# Patient Record
Sex: Female | Born: 1950 | Race: White | Hispanic: No | Marital: Single | Smoking: Former smoker
Health system: Southern US, Community
[De-identification: ages and names within clinical notes are randomized; demographics above are authoritative.]

## PROBLEM LIST (undated history)

## (undated) DIAGNOSIS — A4902 Methicillin resistant Staphylococcus aureus infection, unspecified site: Secondary | ICD-10-CM

## (undated) DIAGNOSIS — Z22322 Carrier or suspected carrier of Methicillin resistant Staphylococcus aureus: Secondary | ICD-10-CM

## (undated) DIAGNOSIS — K219 Gastro-esophageal reflux disease without esophagitis: Secondary | ICD-10-CM

## (undated) DIAGNOSIS — M1990 Unspecified osteoarthritis, unspecified site: Secondary | ICD-10-CM

## (undated) DIAGNOSIS — C9310 Chronic myelomonocytic leukemia not having achieved remission: Secondary | ICD-10-CM

## (undated) DIAGNOSIS — R197 Diarrhea, unspecified: Secondary | ICD-10-CM

## (undated) DIAGNOSIS — R945 Abnormal results of liver function studies: Secondary | ICD-10-CM

## (undated) DIAGNOSIS — E538 Deficiency of other specified B group vitamins: Secondary | ICD-10-CM

## (undated) DIAGNOSIS — I4892 Unspecified atrial flutter: Secondary | ICD-10-CM

## (undated) DIAGNOSIS — Z9289 Personal history of other medical treatment: Secondary | ICD-10-CM

## (undated) DIAGNOSIS — I10 Essential (primary) hypertension: Secondary | ICD-10-CM

## (undated) DIAGNOSIS — J189 Pneumonia, unspecified organism: Secondary | ICD-10-CM

## (undated) DIAGNOSIS — R42 Dizziness and giddiness: Secondary | ICD-10-CM

## (undated) DIAGNOSIS — K559 Vascular disorder of intestine, unspecified: Secondary | ICD-10-CM

## (undated) DIAGNOSIS — R51 Headache: Secondary | ICD-10-CM

## (undated) DIAGNOSIS — E6609 Other obesity due to excess calories: Secondary | ICD-10-CM

## (undated) DIAGNOSIS — M13169 Monoarthritis, not elsewhere classified, unspecified knee: Secondary | ICD-10-CM

## (undated) DIAGNOSIS — D649 Anemia, unspecified: Secondary | ICD-10-CM

## (undated) DIAGNOSIS — R7989 Other specified abnormal findings of blood chemistry: Secondary | ICD-10-CM

## (undated) DIAGNOSIS — J449 Chronic obstructive pulmonary disease, unspecified: Secondary | ICD-10-CM

## (undated) HISTORY — DX: Deficiency of other specified B group vitamins: E53.8

## (undated) HISTORY — DX: Other specified abnormal findings of blood chemistry: R79.89

## (undated) HISTORY — PX: SMALL INTESTINE SURGERY: SHX150

## (undated) HISTORY — DX: Abnormal results of liver function studies: R94.5

## (undated) HISTORY — DX: Vascular disorder of intestine, unspecified: K55.9

## (undated) HISTORY — DX: Methicillin resistant Staphylococcus aureus infection, unspecified site: A49.02

## (undated) HISTORY — DX: Gastro-esophageal reflux disease without esophagitis: K21.9

## (undated) HISTORY — PX: OTHER SURGICAL HISTORY: SHX169

## (undated) HISTORY — DX: Essential (primary) hypertension: I10

## (undated) HISTORY — DX: Diarrhea, unspecified: R19.7

## (undated) HISTORY — DX: Chronic myelomonocytic leukemia not having achieved remission: C93.10

## (undated) HISTORY — DX: Anemia, unspecified: D64.9

## (undated) HISTORY — DX: Unspecified osteoarthritis, unspecified site: M19.90

## (undated) HISTORY — DX: Chronic obstructive pulmonary disease, unspecified: J44.9

## (undated) HISTORY — DX: Other obesity due to excess calories: E66.09

## (undated) HISTORY — PX: KNEE SURGERY: SHX244

## (undated) HISTORY — DX: Monoarthritis, not elsewhere classified, unspecified knee: M13.169

---
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
## 1986-04-18 HISTORY — PX: ABDOMINAL HYSTERECTOMY: SHX81

## 1998-07-29 ENCOUNTER — Other Ambulatory Visit: Admission: RE | Admit: 1998-07-29 | Discharge: 1998-07-29 | Payer: Self-pay | Admitting: Obstetrics and Gynecology

## 1999-10-17 ENCOUNTER — Encounter: Payer: Self-pay | Admitting: Neurological Surgery

## 1999-10-17 ENCOUNTER — Ambulatory Visit (HOSPITAL_COMMUNITY): Admission: RE | Admit: 1999-10-17 | Discharge: 1999-10-17 | Payer: Self-pay | Admitting: Neurological Surgery

## 1999-11-02 ENCOUNTER — Other Ambulatory Visit: Admission: RE | Admit: 1999-11-02 | Discharge: 1999-11-02 | Payer: Self-pay | Admitting: Obstetrics and Gynecology

## 1999-11-11 ENCOUNTER — Encounter: Payer: Self-pay | Admitting: Obstetrics and Gynecology

## 1999-11-11 ENCOUNTER — Ambulatory Visit (HOSPITAL_COMMUNITY): Admission: RE | Admit: 1999-11-11 | Discharge: 1999-11-11 | Payer: Self-pay | Admitting: Obstetrics and Gynecology

## 1999-11-18 ENCOUNTER — Encounter: Admission: RE | Admit: 1999-11-18 | Discharge: 1999-11-18 | Payer: Self-pay | Admitting: Neurosurgery

## 1999-11-18 ENCOUNTER — Encounter: Payer: Self-pay | Admitting: Neurological Surgery

## 2001-01-16 ENCOUNTER — Other Ambulatory Visit: Admission: RE | Admit: 2001-01-16 | Discharge: 2001-01-16 | Payer: Self-pay | Admitting: Obstetrics and Gynecology

## 2005-03-04 ENCOUNTER — Encounter: Admission: RE | Admit: 2005-03-04 | Discharge: 2005-03-04 | Payer: Self-pay | Admitting: Obstetrics and Gynecology

## 2005-03-21 ENCOUNTER — Encounter: Admission: RE | Admit: 2005-03-21 | Discharge: 2005-03-21 | Payer: Self-pay | Admitting: Obstetrics and Gynecology

## 2006-05-16 ENCOUNTER — Encounter: Admission: RE | Admit: 2006-05-16 | Discharge: 2006-05-16 | Payer: Self-pay | Admitting: Geriatric Medicine

## 2006-09-29 ENCOUNTER — Inpatient Hospital Stay (HOSPITAL_COMMUNITY): Admission: AD | Admit: 2006-09-29 | Discharge: 2006-10-01 | Payer: Self-pay | Admitting: Orthopaedic Surgery

## 2006-10-02 ENCOUNTER — Ambulatory Visit: Payer: Self-pay | Admitting: Infectious Diseases

## 2007-07-11 ENCOUNTER — Encounter: Admission: RE | Admit: 2007-07-11 | Discharge: 2007-07-11 | Payer: Self-pay | Admitting: Cardiology

## 2007-07-19 ENCOUNTER — Observation Stay (HOSPITAL_COMMUNITY): Admission: AD | Admit: 2007-07-19 | Discharge: 2007-07-20 | Payer: Self-pay | Admitting: Gastroenterology

## 2007-07-20 ENCOUNTER — Encounter (INDEPENDENT_AMBULATORY_CARE_PROVIDER_SITE_OTHER): Payer: Self-pay | Admitting: Gastroenterology

## 2008-06-23 ENCOUNTER — Encounter: Admission: RE | Admit: 2008-06-23 | Discharge: 2008-06-23 | Payer: Self-pay | Admitting: Cardiology

## 2010-05-09 ENCOUNTER — Encounter: Payer: Self-pay | Admitting: Cardiology

## 2010-05-09 ENCOUNTER — Encounter: Payer: Self-pay | Admitting: Gastroenterology

## 2010-06-03 ENCOUNTER — Ambulatory Visit (INDEPENDENT_AMBULATORY_CARE_PROVIDER_SITE_OTHER): Payer: BLUE CROSS/BLUE SHIELD | Admitting: Cardiology

## 2010-06-03 DIAGNOSIS — Z79899 Other long term (current) drug therapy: Secondary | ICD-10-CM

## 2010-06-03 DIAGNOSIS — I119 Hypertensive heart disease without heart failure: Secondary | ICD-10-CM

## 2010-06-03 DIAGNOSIS — M1990 Unspecified osteoarthritis, unspecified site: Secondary | ICD-10-CM

## 2010-08-10 ENCOUNTER — Other Ambulatory Visit: Payer: Self-pay | Admitting: *Deleted

## 2010-08-10 DIAGNOSIS — I119 Hypertensive heart disease without heart failure: Secondary | ICD-10-CM

## 2010-08-10 MED ORDER — HYDROCHLOROTHIAZIDE 25 MG PO TABS: ORAL_TABLET | ORAL | Status: DC

## 2010-08-10 NOTE — Telephone Encounter (Signed)
Refilled meds per fax request.  

## 2010-08-31 NOTE — H&P (Signed)
NAMESHERAE, SANTINO NO.:  [id number]   MEDICAL RECORD NO.:  [id number]          PATIENT TYPE:  INP   LOCATION:  6742                         FACILITY:  [facility]   PHYSICIAN:  Petra Kuba, M.D.    DATE OF BIRTH:  [birth year]   DATE OF ADMISSION:  07/19/2007  DATE OF DISCHARGE:                              HISTORY & PHYSICAL   ADMITTING DIAGNOSES:  1. Severe nausea.  2. Abnormal CT.  3. Elevated liver function tests.   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  She  describes feeling ill for the past month and feeling particularly  nauseated over the last week.  She thought the nausea might be coming  from either recent antibiotics or iron that she has started taking for  her anemia.  She denies any hematochezia, hematemesis or melenic stools.  She tells me that she has been constipated lately and that this is  normal for her.  She denies any abdominal pain.  Her appetite has  decreased.   Unfortunately, she has MRSA in her knee last summer and developed sepsis  from it.  She got over that after a course of IV vancomycin.  This past  October, an area of MRSA reoccurred on her lower right leg; she is being  treated for that with Septra, which was just stopped this past Monday  due to her severe nausea.   She was found to be anemic in Dr. Yevonne Pax office with a hemoglobin  of 9.8.  She had elevated transaminases, alkaline phosphatase was 504,  AST was 49, ALT was 57.  She has CT of her abdomen which showed somewhat  prominent porta hepatis, but a normal liver.  The patient reports she  has been vaccinated for hepatitis B.   MEDICATIONS:  Include Lotrel, hydrochlorothiazide, Clarinex, Celebrex,  Estrace, Wellbutrin and Prilosec.   ALLERGIES:  She has allergies to PENICILLIN, TETANUS and CODEINE.   PAST SURGICAL HISTORY:  1. Ventral hernia repair.  2. Two  laparotomies for bowel obstructions.  3. Surgery to her knee, requiring frequent drainage arthroscopically      due to the MRSA that she had last summer.  4. Appendectomy.  5. Hysterectomy.  6. Tubal ligation.  7. Tonsillectomy.   PAST MEDICAL HISTORY:  She has a history of:  1. Depression.  2. Acid reflux.  3. Migraines.  4. Hypertension.   FAMILY HISTORY:  Negative for colon cancer and colon polyps; it is also  negative for liver disease.   SOCIAL HISTORY:  She is positive for tobacco; she smokes 4-5 cigarettes  per day and has for 20 years.  She drinks rare alcohol.   PHYSICAL EXAMINATION:  GENERAL: She is alert and oriented, in no  apparent distress.  HEART:  Regular rate and rhythm.  LUNGS:  Demonstrate expiratory wheeze bilaterally.  ABDOMEN:  Soft, nontender, nondistended with good bowel sounds.  SKIN:  She has no jaundice, no icterus.  RECTAL:  Deferred, but she was found to  be guaiac-negative in the [facility] office by Dr. Randa Evens on March 31.   LABORATORY AND ACCESSORY CLINICAL DATA:  Today's labs show a white count  4.1, hemoglobin 10.9, hematocrit 31.3, MCV value of 87.8, platelets  281,000.  Sodium 123, potassium 4, chloride 90, bicarb 24, glucose 114,  BUN 17, creatinine 1.10.  Her bilirubin is found to be 0.5, alkaline  phosphatase 466, AST 44, ALT 48, albumin 3.7.   ASSESSMENT:  Dr. Vida Rigger has seen and examined the patient, collected  a history and reviewed her chart.  His impression is that she is:  1. Hyponatremic and likely dehydrated.  2. Has severe nausea.  3. Has an abnormality in her porta hepatis on CT scan and has abnormal      LFTs.  We will review CT with the radiologist, plan for an upper      endoscopy in the morning and give her a bolus of normal saline this      evening and continue with intravenous hydration, proceed with      checking her fractionated alkaline phosphatase as well as AMA and      ANA.  The patient has been briefed on  endoscopy; she understands      the procedures as well as the risks of perforation, sedation and      bleeding and she agrees to proceed.      Stephani Police, PA    ______________________________  Petra Kuba, M.D.    MLY/MEDQ  D:  07/19/2007  T:  07/20/2007  Job:  [id number]   cc:   Petra Kuba, M.D.

## 2010-08-31 NOTE — Op Note (Signed)
NAMERIKKI, TROSPER NO.:  [id number]   MEDICAL RECORD NO.:  [id number]          PATIENT TYPE:  INP   LOCATION:  6742                         FACILITY:  [facility]   PHYSICIAN:  Petra Kuba, M.D.    DATE OF BIRTH:  [birth year]   DATE OF PROCEDURE:  07/20/2007  DATE OF DISCHARGE:                               OPERATIVE REPORT   PROCEDURE:  Esophagogastroduodenoscopy with biopsy.   INDICATIONS:  Significant nausea, abnormal CAT scan with questions about  perihilar lymph node and an abnormal esophagus.  Anemia.   CONSENT:  Consent was signed after risks, benefits and options  thoroughly discussed last night prior to sedation.   MEDICINES USED:  Fentanyl 75 mcg, Versed 8 mg.   DESCRIPTION OF PROCEDURE:  The video endoscope was inserted by direct  vision.  Quick look at the vocal cords was normal.  The esophagus was  normal.  There were no signs of esophagitis.  She did have a small  hiatal hernia.  Scope passed into the stomach, advanced through a normal  antrum, normal pylorus, into a normal duodenal bulb, around the C-loop;  I believe we saw the ampulla, which was normal, and into the second and  possibly third part of the duodenum.  The duodenum appeared normal.  A  few scattered biopsies were obtained to rule out malabsorption.  The  scope was slowly withdrawn back to the bulb, which was normal.  The  scope was withdrawn back to the stomach and retroflexed.  The angularis,  cardia, lesser and greater curve were all normal on retroflexed  visualization, except for the hiatal hernia being confirmed in the  cardia.  Straight visualization in the stomach did not reveal any  additional findings.  Air was suctioned.  The scope was slowly withdrawn  again.  A good look at the esophagus was normal.  Scope was removed.  The patient tolerated the procedure well.  There was no obvious  immediate complication.   ENDOSCOPIC DIAGNOSES:  1. Small hiatal hernia.  2.  Otherwise normal EGD to the second or third part of the duodenum,      status post duodenal biopsies.  I believe we saw the ampulla, which      appeared normal.   PLAN:  Await pathology.  If okay after lunch, consider outpatient workup  with probable EUS and biopsy of any of the nodes are seen and probable  colonoscopy next, due to her iron deficiency.  Follow liver tests.  Await AMA and other hepatitis studies.  Recheck sodium next week.           ______________________________  Petra Kuba, M.D.    MEM/MEDQ  D:  07/20/2007  T:  07/20/2007  Job:  [id number]   cc:   Fayrene Fearing L. Malon Kindle., M.D.  Cassell Clement, M.D.

## 2010-08-31 NOTE — Consult Note (Signed)
[name], [name] NO.:  [id number]   MEDICAL RECORD NO.:  [id number]          PATIENT TYPE:  INP   LOCATION:  6742                         FACILITY:  [facility]   PHYSICIAN:  Cassell Clement, M.D. DATE OF BIRTH:  21-Jul-1950   DATE OF CONSULTATION:  DATE OF DISCHARGE:                                 CONSULTATION   CHIEF COMPLAINT:  Weakness and hyponatremia.   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 Carman Ching' service because of possible  dehydration and hyponatremia.  She has been followed in our office for  many years for essential hypertension.  She has been on long-term Lotrel  5/40 once a day and hydrochlorothiazide 25 mg once a day for difficult-  to-control hypertension.  Other medications chronically have included  Clarinex, Estrace, Wellbutrin SR 300, Celebrex 200 mg daily and calcium  with vitamin D.  The patient was seen in our office on June 20, 2007,  for a routine 9-month office visit and at that time the patient  complained of some vertigo and dizziness and we obtained routine labs  including a CBC and a CMET.  At that time she was found to have a sodium  of 136 and alkaline phosphatase of 504.  Her hemoglobin was low at 9.8  although her cells appeared to be normocytic/normochromic.  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  She did not give  any history of hematochezia or melena or hematemesis and she is post  hysterectomy and has not had any vaginal blood loss.  Subsequently, she  developed further weakness and nausea and called our office and we made  arrangements for her to have an outpatient CT scan of her abdomen and  pelvis at [facility].  This was done on March 25 and showed no  acute abdominal process, showed constipation and showed mild upper  abdominal adenopathy.  It also  appeared show some fluid level in the  distal esophagus.  The patient's family called back earlier this week  stating that the patient was worse and we advised her to see her  gastroenterologist, Dr. Randa Evens, who saw her earlier this week.  Blood  work drawn on July 18, 2007, by Dr. Randa Evens showed a sodium of 136,  hemoglobin of 10.2, and today on admission sodium is down to 123.   The patient is allergic to PENICILLIN, TETANUS and CODEINE.  She also  does not tolerate APRESOLINE, MINIPRESS and DOXYCYCLINE.  The  doxycycline caused dizziness.   PAST MEDICAL HISTORY:  Positive for depression, history of acid reflux.  Of note is the fact that the patient had an injury suffered in the  [place] and subsequent surgery and has had a MRSA infection of her skin  on the right lower leg.   SOCIAL HISTORY:  Reveals that she is divorced.  She has two children.  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  She smokes  4-5  cigarettes a day.  She does not drink much alcohol.   FAMILY HISTORY:  Positive for hypertension.   PHYSICAL EXAM:  On the night of admission her blood pressure is 130/85  in the right arm sitting, pulse is 80 and regular, respirations are  normal.  HEAD AND NECK EXAM:  Revealed no scleral icterus.  The jugular venous  pressure is normal.  Carotids are normal.  CHEST:  Clear.  HEART:  Reveals no murmur, gallop, rub or click.  ABDOMEN:  Soft and nontender.  Liver and spleen are not palpable.  There  is no palpable mass or organomegaly.  Abdomen is soft.  EXTREMITIES:  Show no phlebitis.  She does have a bandage on the right leg wound.   IMPRESSION:  1. Hyponatremia, uncertain etiology.  Most likely secondary to long      term ACE inhibitor use and hydrochlorothiazide use but we want to      rule out other causes such as serum inappropriate antidiuretic      hormone secretion.  2. History of essential hypertension previously difficult to control      but now  normotensive off of medications.  3. Probable dehydration.  4. Tobacco abuse.   DISPOSITION:  As already ordered we will give IV saline tonight and  observe response.  Will check urine sodium and potassium and osmolality,  as well as a serum osmolality.  Will follow with you.   Thank you for notifying me of her admission.           ______________________________  Cassell Clement, M.D.     TB/MEDQ  D:  07/19/2007  T:  07/20/2007  Job:  [id number]   cc:   Fayrene Fearing L. Malon Kindle., M.D.

## 2010-08-31 NOTE — Op Note (Signed)
NAME:  [name], HALBERT NO.:  [id number]   MEDICAL RECORD NO.:  [id number]          PATIENT TYPE:  AMB   LOCATION:  SDS                          FACILITY:  [facility]   PHYSICIAN:  Claude Manges. Whitfield, M.D.DATE OF BIRTH:  [birth year]   DATE OF PROCEDURE:  09/29/2006  DATE OF DISCHARGE:                               OPERATIVE REPORT   PREOPERATIVE DIAGNOSIS:  Prepatellar abscess, right knee.   POSTOPERATIVE DIAGNOSIS:  Prepatellar abscess, right knee.   PROCEDURE:  Irrigation and debridement prepatellar abscess, right knee.   SURGEON:  Claude Manges. Cleophas Dunker, M.D.   ASSISTANT:  Richardean Canal, P.A.-C.   ANESTHESIA:  General.   COMPLICATIONS:  None.   HISTORY:  60 year old female who is approximately five weeks status post  injury to her right knee where she sustained a direct blow landing on an  object while visiting in the [place]. She sustained a large transverse  laceration of the suprapatellar pouch of the right knee that was  repaired by a physician in the [place].  She returned to the [place] and began to develop some breakdown of the wound.  She was seen  initially in my office earlier in the week with evidence of probable  stitch abscess.  She was placed on Keflex.  She has been on various  antibiotics over the last several weeks.  She was seen today in the  office with a large fluid wave in the prepatellar area.  This was  aspirated with abundant purulence.  Gram stain was negative for  organisms but positive for white blood cells. Culture is pending. She is  now to have irrigation and debridement the abscess.   DESCRIPTION OF PROCEDURE:  With the patient comfortable on the operating  table under general orotracheal anesthesia, the right lower extremity  was placed in a thigh tourniquet.  The leg was then prepped with  Betadine scrub and Betadine solution from the tourniquet to the ankle.  Sterile draping was performed. With the extremity still  elevated, it was  Esmarch exsanguinated and the proximal tourniquet at 350 mmHg.   The prior laceration was transverse, probably 6 or more inches in  length, and located across the distal thigh proximal to the patella.  There were several areas that were open and there were actually several  areas of tiny puncture areas of purulence. I initially made about a 1  inch longitudinal incision in the lateral parapatellar area where there  was the most fluctuance and upon entering the prepatellar pouch, there  was abundant purulence.  By finger palpation, extended from the tibial  tubercle to the medial prepatellar region to about an inch proximal to  the laceration. By finger palpation, I was able to break up any fibrous  bands and thus, decompress any areas that were sequestering purulence.  I copiously irrigated the wound with several 1000 mL of saline solution.   Because of the small areas of purulence within the most lateral  incision, I opened it up about an inch and removed several clear skin  stitches that might  very well have been Monocryl. These areas were  debrided and then I simply closed them all with interrupted 3-0 nylon.  I also closed the 1 inch incision longitudinally with the same material  prior to which I inserted a Jackson-Pratt tube that was externalized  through a separate stab wound inferiorly and medially. Xeroform gauze  was placed over all the wounds followed by a sterile bulky dressing and  Ace bandage. The tourniquet was deflated prior to which I thought that  all the skin had good capillary refill.   PLAN:  We will admit Ms. Coad for several days, get an infectious  disease consult, will start IV vancomycin as she is allergic to  penicillin, and await culture report.      Claude Manges. Cleophas Dunker, M.D.  Electronically Signed     PWW/MEDQ  D:  09/29/2006  T:  09/30/2006  Job:  [id number]

## 2010-12-06 ENCOUNTER — Encounter: Payer: Self-pay | Admitting: Cardiology

## 2010-12-15 ENCOUNTER — Other Ambulatory Visit (INDEPENDENT_AMBULATORY_CARE_PROVIDER_SITE_OTHER): Payer: BLUE CROSS/BLUE SHIELD | Admitting: *Deleted

## 2010-12-15 ENCOUNTER — Other Ambulatory Visit: Payer: Self-pay | Admitting: Cardiology

## 2010-12-15 ENCOUNTER — Ambulatory Visit (INDEPENDENT_AMBULATORY_CARE_PROVIDER_SITE_OTHER): Payer: BLUE CROSS/BLUE SHIELD | Admitting: Cardiology

## 2010-12-15 VITALS — BP 160/90 | HR 88

## 2010-12-15 DIAGNOSIS — J45909 Unspecified asthma, uncomplicated: Secondary | ICD-10-CM

## 2010-12-15 DIAGNOSIS — D649 Anemia, unspecified: Secondary | ICD-10-CM

## 2010-12-15 DIAGNOSIS — I119 Hypertensive heart disease without heart failure: Secondary | ICD-10-CM | POA: Insufficient documentation

## 2010-12-15 DIAGNOSIS — E7800 Pure hypercholesterolemia, unspecified: Secondary | ICD-10-CM

## 2010-12-15 DIAGNOSIS — R748 Abnormal levels of other serum enzymes: Secondary | ICD-10-CM

## 2010-12-15 DIAGNOSIS — I10 Essential (primary) hypertension: Secondary | ICD-10-CM

## 2010-12-15 HISTORY — DX: Anemia, unspecified: D64.9

## 2010-12-15 LAB — HEPATIC FUNCTION PANEL
AST: 36 U/L (ref 0–37)
Albumin: 4.3 g/dL (ref 3.5–5.2)
Alkaline Phosphatase: 180 U/L — ABNORMAL HIGH (ref 39–117)
Bilirubin, Direct: 0.2 mg/dL (ref 0.0–0.3)
Total Protein: 7 g/dL (ref 6.0–8.3)

## 2010-12-15 LAB — CBC WITH DIFFERENTIAL/PLATELET
HCT: 31.6 % — ABNORMAL LOW (ref 36.0–46.0)
MCHC: 32.7 g/dL (ref 30.0–36.0)
MCV: 100.8 fl — ABNORMAL HIGH (ref 78.0–100.0)
Platelets: 399 10*3/uL (ref 150.0–400.0)

## 2010-12-15 LAB — LIPID PANEL
Cholesterol: 216 mg/dL — ABNORMAL HIGH (ref 0–200)
Total CHOL/HDL Ratio: 2

## 2010-12-15 LAB — BASIC METABOLIC PANEL
BUN: 19 mg/dL (ref 6–23)
CO2: 24 mEq/L (ref 19–32)
Calcium: 9.1 mg/dL (ref 8.4–10.5)
Creatinine, Ser: 0.9 mg/dL (ref 0.4–1.2)

## 2010-12-15 NOTE — Assessment & Plan Note (Signed)
The patient has not been experiencing any increased shortness of breath.  She denies any chest pain.  She did take a Claritin-D this morning which may explain the fact that her blood pressure is higher than usual today

## 2010-12-15 NOTE — Assessment & Plan Note (Signed)
Patient has a history of hypercholesterolemia.  She is trying to work harder to bring her weight down.  She is attempting to stay on a low-cholesterol diet.  Blood work is pending.

## 2010-12-15 NOTE — Progress Notes (Signed)
[name] Date of Birth:  Jan 17, 1951 [facility] [address]   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  She has a past history of essential hypertension she also has a history of chronically elevated liver function studies she's had a past history of anemia and a past history of osteoarthritis.  When we last saw her in February 2012 she had a recent MRSA infection of the right knee.  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  Since last visit she's had no new cardiac symptoms.  Current Outpatient Prescriptions  Medication Sig Dispense Refill  . benazepril (LOTENSIN) 20 MG tablet Take 20 mg by mouth daily.        Marland Kitchen buPROPion (WELLBUTRIN XL) 300 MG 24 hr tablet Take 300 mg by mouth daily.        . Calcium Carbonate-Vitamin D (CALCIUM + D PO) Take 500 mg by mouth daily.        . celecoxib (CELEBREX) 200 MG capsule Take 200 mg by mouth daily.        . cyclobenzaprine (FLEXERIL) 10 MG tablet Take 10 mg by mouth as needed.        . Desloratadine (CLARINEX PO) Take by mouth as needed.        Marland Kitchen estradiol (ESTRACE) 2 MG tablet Take 2 mg by mouth daily.        . hydrochlorothiazide 25 MG tablet Daily or as directed  30 tablet  11  . montelukast (SINGULAIR) 10 MG tablet Take 10 mg by mouth as needed.        . Multiple Vitamin (MULTIVITAMIN PO) Take by mouth.          Allergies  Allergen Reactions  . Doxycycline   . Minitabs (Aspirin)   . Penicillins     There is no problem list on file for this patient.   History  Smoking status  . Current Some Day Smoker  Smokeless tobacco  . Not on file    History  Alcohol Use     No family history on file.  Review of Systems: The patient denies any heat or cold intolerance.  No weight gain or weight loss.  The patient denies headaches or  blurry vision.  There is no cough or sputum production.  The patient denies dizziness.  There is no hematuria or hematochezia.  The patient denies any muscle aches or arthritis.  The patient denies any rash.  The patient denies frequent falling or instability.  There is no history of depression or anxiety.  All other systems were reviewed and are negative.   Physical Exam: Filed Vitals:   12/15/10 1048  BP: 160/90  Pulse: 88  the general appearance reveals a well-developed well-nourished woman in no distress.The head and neck exam reveals pupils equal and reactive.  Extraocular movements are full.  There is no scleral icterus.  The mouth and pharynx are normal.  The neck is supple.  The carotids reveal no bruits.  The jugular venous pressure is normal.  The  thyroid is not enlarged.  There is no lymphadenopathy.  The chest is clear to percussion and auscultation.  There are no rales or rhonchi.  Expansion of the chest is symmetrical.  The precordium is quiet.  The first  heart sound is normal.  The second heart sound is physiologically split.  There is no murmur gallop rub or click.  There is no abnormal lift or heave.  The abdomen is soft and nontender.  The bowel sounds are normal.  The liver and spleen are not enlarged.  There are no abdominal masses.  There are no abdominal bruits.  Extremities reveal good pedal pulses.  There is no phlebitis or edema.  There is no cyanosis or clubbing.  Strength is normal and symmetrical in all extremities.  There is no lateralizing weakness.  There are no sensory deficits.  The skin is warm and dry.  There is no rash.      Assessment / Plan:  Continue same medication.  Recheck in 6 monthsfor office visit and fasting lab work and CBC to followup on her anemia

## 2010-12-15 NOTE — Assessment & Plan Note (Signed)
The patient has not had any evidence of hematochezia or melena

## 2010-12-16 ENCOUNTER — Telehealth: Payer: Self-pay | Admitting: *Deleted

## 2010-12-16 NOTE — Telephone Encounter (Signed)
Message copied by Eugenia Pancoast on Thu Dec 16, 2010 12:24 PM ------      Message from: Cassell Clement      Created: Wed Dec 15, 2010  9:30 PM       Please report.  Her white count is normal at 5400.The hemoglobin is stable at 10.3.The cholesterol is 216 and HDL is very good at 92The liver function studies are normal except that the alkaline phosphatase remains high at 180 but this could also be from her previous problem with her bones.Her renal function is normal with a BUN of 19 and a creatinine of 0.9 her sodium is 134.  The blood sugar is normal at 85.            Continue same medication

## 2010-12-16 NOTE — Telephone Encounter (Signed)
Mailed copy of labs 

## 2011-01-11 LAB — URINALYSIS, ROUTINE W REFLEX MICROSCOPIC
Glucose, UA: NEGATIVE
Nitrite: NEGATIVE
Specific Gravity, Urine: 1.007
pH: 6.5

## 2011-01-11 LAB — COMPREHENSIVE METABOLIC PANEL
ALT: 48 — ABNORMAL HIGH
AST: 38 — ABNORMAL HIGH
Albumin: 3.7
Alkaline Phosphatase: 466 — ABNORMAL HIGH
BUN: 17
Calcium: 8.2 — ABNORMAL LOW
Chloride: 90 — ABNORMAL LOW
GFR calc non Af Amer: 56 — ABNORMAL LOW
Glucose, Bld: 114 — ABNORMAL HIGH
Potassium: 3.6
Potassium: 4
Sodium: 128 — ABNORMAL LOW
Total Bilirubin: 0.4
Total Bilirubin: 0.5
Total Protein: 5.6 — ABNORMAL LOW

## 2011-01-11 LAB — CULTURE, BLOOD (ROUTINE X 2): Culture: NO GROWTH

## 2011-01-11 LAB — ALKALINE PHOSPHATASE, ISOENZYMES
ALP, Heat Stable (Liver): 351
Alk Phos: 490 — ABNORMAL HIGH (ref 39–117)

## 2011-01-11 LAB — CBC
HCT: 31.3 — ABNORMAL LOW
MCHC: 34.5
MCHC: 34.9
MCV: 87.8
Platelets: 281
Platelets: 288
RDW: 14.9
RDW: 14.9
WBC: 4.1

## 2011-01-11 LAB — LIPID PANEL
Cholesterol: 195
Total CHOL/HDL Ratio: 3.1

## 2011-01-11 LAB — IRON AND TIBC
Iron: 39 — ABNORMAL LOW
TIBC: 447

## 2011-01-11 LAB — FERRITIN: Ferritin: 34 (ref 10–291)

## 2011-01-11 LAB — ANTI-NUCLEAR AB-TITER (ANA TITER): ANA Titer 1: 1:40 {titer} — ABNORMAL HIGH

## 2011-01-11 LAB — RETICULOCYTES: Retic Count, Absolute: 29.9

## 2011-01-11 LAB — B-NATRIURETIC PEPTIDE (CONVERTED LAB): Pro B Natriuretic peptide (BNP): <30

## 2011-01-11 LAB — T4: T4, Total: 9.9

## 2011-01-13 ENCOUNTER — Other Ambulatory Visit: Payer: Self-pay | Admitting: Geriatric Medicine

## 2011-01-13 DIAGNOSIS — M25552 Pain in left hip: Secondary | ICD-10-CM

## 2011-01-28 ENCOUNTER — Other Ambulatory Visit: Payer: Self-pay | Admitting: Geriatric Medicine

## 2011-01-28 ENCOUNTER — Ambulatory Visit
Admission: RE | Admit: 2011-01-28 | Discharge: 2011-01-28 | Disposition: A | Discharge status: Home / Self Care | Payer: BLUE CROSS/BLUE SHIELD | Source: Ambulatory Visit | Attending: Geriatric Medicine | Admitting: Geriatric Medicine

## 2011-01-28 DIAGNOSIS — M25552 Pain in left hip: Secondary | ICD-10-CM

## 2011-02-03 LAB — CBC
HCT: 28.6 — ABNORMAL LOW
HCT: 31.1 — ABNORMAL LOW
Hemoglobin: 10.5 — ABNORMAL LOW
Hemoglobin: 9.7 — ABNORMAL LOW
MCHC: 33.7
MCHC: 33.8
MCV: 86.7
MCV: 87.3
Platelets: 424 — ABNORMAL HIGH
Platelets: 443 — ABNORMAL HIGH
RBC: 3.3 — ABNORMAL LOW
RBC: 3.56 — ABNORMAL LOW
RDW: 15.1 — ABNORMAL HIGH
RDW: 15.3 — ABNORMAL HIGH
WBC: 6.4
WBC: 7.8

## 2011-02-03 LAB — BASIC METABOLIC PANEL
BUN: 9
CO2: 24
Calcium: 8.6
Calcium: 8.8
Creatinine, Ser: 0.64
Creatinine, Ser: 0.64
GFR calc Af Amer: >60
GFR calc non Af Amer: >60
Glucose, Bld: 103 — ABNORMAL HIGH
Sodium: 132 — ABNORMAL LOW

## 2011-02-03 LAB — APTT: aPTT: 31

## 2011-02-03 LAB — PROTIME-INR
INR: 1
Prothrombin Time: 13.6

## 2011-03-04 ENCOUNTER — Ambulatory Visit
Admission: RE | Admit: 2011-03-04 | Discharge: 2011-03-04 | Disposition: A | Discharge status: Home / Self Care | Payer: BC Managed Care – PPO | Source: Ambulatory Visit | Attending: Geriatric Medicine | Admitting: Geriatric Medicine

## 2011-03-04 ENCOUNTER — Other Ambulatory Visit: Payer: Self-pay | Admitting: Geriatric Medicine

## 2011-03-04 DIAGNOSIS — R531 Weakness: Secondary | ICD-10-CM

## 2011-03-04 DIAGNOSIS — R52 Pain, unspecified: Secondary | ICD-10-CM

## 2011-05-07 ENCOUNTER — Other Ambulatory Visit: Payer: Self-pay

## 2011-05-07 ENCOUNTER — Inpatient Hospital Stay (HOSPITAL_COMMUNITY)
Admission: EM | Admit: 2011-05-07 | Discharge: 2011-05-20 | DRG: 188 | Disposition: A | Discharge status: Home / Self Care | Payer: BC Managed Care – PPO | Attending: Internal Medicine | Admitting: Internal Medicine

## 2011-05-07 DIAGNOSIS — I119 Hypertensive heart disease without heart failure: Secondary | ICD-10-CM | POA: Diagnosis present

## 2011-05-07 DIAGNOSIS — Z8614 Personal history of Methicillin resistant Staphylococcus aureus infection: Secondary | ICD-10-CM

## 2011-05-07 DIAGNOSIS — Z8701 Personal history of pneumonia (recurrent): Secondary | ICD-10-CM

## 2011-05-07 DIAGNOSIS — E860 Dehydration: Secondary | ICD-10-CM | POA: Diagnosis present

## 2011-05-07 DIAGNOSIS — Z79890 Hormone replacement therapy (postmenopausal): Secondary | ICD-10-CM

## 2011-05-07 DIAGNOSIS — N289 Disorder of kidney and ureter, unspecified: Secondary | ICD-10-CM | POA: Diagnosis not present

## 2011-05-07 DIAGNOSIS — G8929 Other chronic pain: Secondary | ICD-10-CM | POA: Diagnosis present

## 2011-05-07 DIAGNOSIS — E669 Obesity, unspecified: Secondary | ICD-10-CM | POA: Diagnosis present

## 2011-05-07 DIAGNOSIS — M549 Dorsalgia, unspecified: Secondary | ICD-10-CM | POA: Diagnosis present

## 2011-05-07 DIAGNOSIS — E876 Hypokalemia: Secondary | ICD-10-CM | POA: Diagnosis not present

## 2011-05-07 DIAGNOSIS — K559 Vascular disorder of intestine, unspecified: Principal | ICD-10-CM | POA: Diagnosis present

## 2011-05-07 DIAGNOSIS — I4891 Unspecified atrial fibrillation: Secondary | ICD-10-CM | POA: Diagnosis not present

## 2011-05-07 DIAGNOSIS — E871 Hypo-osmolality and hyponatremia: Secondary | ICD-10-CM | POA: Diagnosis present

## 2011-05-07 DIAGNOSIS — R7402 Elevation of levels of lactic acid dehydrogenase (LDH): Secondary | ICD-10-CM | POA: Diagnosis present

## 2011-05-07 DIAGNOSIS — E7800 Pure hypercholesterolemia, unspecified: Secondary | ICD-10-CM | POA: Diagnosis present

## 2011-05-07 DIAGNOSIS — D72829 Elevated white blood cell count, unspecified: Secondary | ICD-10-CM | POA: Diagnosis present

## 2011-05-07 DIAGNOSIS — Z79899 Other long term (current) drug therapy: Secondary | ICD-10-CM

## 2011-05-07 DIAGNOSIS — R7401 Elevation of levels of liver transaminase levels: Secondary | ICD-10-CM | POA: Diagnosis present

## 2011-05-07 DIAGNOSIS — Z880 Allergy status to penicillin: Secondary | ICD-10-CM

## 2011-05-07 DIAGNOSIS — I491 Atrial premature depolarization: Secondary | ICD-10-CM | POA: Diagnosis not present

## 2011-05-07 DIAGNOSIS — D649 Anemia, unspecified: Secondary | ICD-10-CM | POA: Diagnosis present

## 2011-05-07 DIAGNOSIS — I959 Hypotension, unspecified: Secondary | ICD-10-CM | POA: Diagnosis present

## 2011-05-07 DIAGNOSIS — R197 Diarrhea, unspecified: Secondary | ICD-10-CM | POA: Diagnosis present

## 2011-05-07 DIAGNOSIS — J309 Allergic rhinitis, unspecified: Secondary | ICD-10-CM | POA: Diagnosis present

## 2011-05-07 DIAGNOSIS — E861 Hypovolemia: Secondary | ICD-10-CM | POA: Diagnosis present

## 2011-05-07 DIAGNOSIS — F17200 Nicotine dependence, unspecified, uncomplicated: Secondary | ICD-10-CM | POA: Diagnosis present

## 2011-05-07 HISTORY — DX: Unspecified atrial flutter: I48.92

## 2011-05-07 HISTORY — DX: Pneumonia, unspecified organism: J18.9

## 2011-05-07 LAB — DIFFERENTIAL
Basophils Relative: 0 % (ref 0–1)
Eosinophils Absolute: 0 10*3/uL (ref 0.0–0.7)
Eosinophils Relative: 0 % (ref 0–5)
Lymphocytes Relative: 8 % — ABNORMAL LOW (ref 12–46)
Neutrophils Relative %: 78 % — ABNORMAL HIGH (ref 43–77)

## 2011-05-07 LAB — COMPREHENSIVE METABOLIC PANEL
ALT: 43 U/L — ABNORMAL HIGH (ref 0–35)
AST: 44 U/L — ABNORMAL HIGH (ref 0–37)
Albumin: 3.8 g/dL (ref 3.5–5.2)
CO2: 21 mEq/L (ref 19–32)
Calcium: 9.8 mg/dL (ref 8.4–10.5)
Chloride: 99 mEq/L (ref 96–112)
GFR calc non Af Amer: 57 mL/min — ABNORMAL LOW (ref >90)
Sodium: 133 mEq/L — ABNORMAL LOW (ref 135–145)

## 2011-05-07 LAB — STOOL CULTURE

## 2011-05-07 LAB — CBC
Hemoglobin: 11.3 g/dL — ABNORMAL LOW (ref 12.0–15.0)
RBC: 3.39 MIL/uL — ABNORMAL LOW (ref 3.87–5.11)
WBC: 18.2 10*3/uL — ABNORMAL HIGH (ref 4.0–10.5)

## 2011-05-07 MED ORDER — METRONIDAZOLE IN NACL 5-0.79 MG/ML-% IV SOLN
500.0000 mg | Freq: Once | INTRAVENOUS | Status: AC
  Administered 2011-05-07: 500 mg via INTRAVENOUS

## 2011-05-07 MED ORDER — VANCOMYCIN 50 MG/ML ORAL SOLUTION
500.0000 mg | Freq: Four times a day (QID) | ORAL | Status: DC
  Filled 2011-05-07 (×2): qty 10

## 2011-05-07 MED ORDER — SODIUM CHLORIDE 0.9 % IV SOLN: INTRAVENOUS | Status: DC

## 2011-05-07 MED ORDER — ONDANSETRON HCL 4 MG/2ML IJ SOLN
4.0000 mg | Freq: Once | INTRAMUSCULAR | Status: AC
  Administered 2011-05-07: 4 mg via INTRAVENOUS
  Filled 2011-05-07: qty 2

## 2011-05-07 MED ORDER — LOPERAMIDE HCL 2 MG PO CAPS
4.0000 mg | ORAL_CAPSULE | ORAL | Status: DC | PRN
  Administered 2011-05-07: 4 mg via ORAL
  Filled 2011-05-07: qty 2

## 2011-05-07 MED ORDER — METRONIDAZOLE IN NACL 5-0.79 MG/ML-% IV SOLN
500.0000 mg | Freq: Three times a day (TID) | INTRAVENOUS | Status: DC
  Administered 2011-05-08 – 2011-05-18 (×32): 500 mg via INTRAVENOUS
  Filled 2011-05-07 (×37): qty 100

## 2011-05-07 MED ORDER — ONDANSETRON HCL 4 MG/2ML IJ SOLN
4.0000 mg | Freq: Three times a day (TID) | INTRAMUSCULAR | Status: AC | PRN
  Administered 2011-05-08: 4 mg via INTRAVENOUS
  Filled 2011-05-07 (×2): qty 2

## 2011-05-07 MED ORDER — MORPHINE SULFATE 2 MG/ML IJ SOLN
2.0000 mg | INTRAMUSCULAR | Status: DC | PRN
  Administered 2011-05-08 – 2011-05-13 (×25): 2 mg via INTRAVENOUS
  Filled 2011-05-07 (×27): qty 1

## 2011-05-07 MED ORDER — SODIUM CHLORIDE 0.9 % IV SOLN
999.0000 mL | Freq: Once | INTRAVENOUS | Status: AC
  Administered 2011-05-07: 1000 mL via INTRAVENOUS

## 2011-05-07 MED ORDER — DICYCLOMINE HCL 10 MG PO CAPS
10.0000 mg | ORAL_CAPSULE | Freq: Once | ORAL | Status: AC
  Administered 2011-05-07: 10 mg via ORAL
  Filled 2011-05-07: qty 1

## 2011-05-07 NOTE — ED Notes (Signed)
Per EMS pt called for reports of Abdominal pain, per EMS pt did have large amount of diarrhea while they were on scene. Pt did have near syncopal episode with EMS when attempting to stand. Unable to obtain IV access. Pt is A & O.

## 2011-05-07 NOTE — ED Provider Notes (Addendum)
History     CSN: [id number]  Arrival date & time 05/07/11  1850   First MD Initiated Contact with Patient 05/07/11 1907      Chief Complaint  Patient presents with  . Abdominal Pain  . Hypotension  . Diarrhea    (Consider location/radiation/quality/duration/timing/severity/associated sxs/prior treatment) Patient is a 61 y.o. female presenting with abdominal pain and diarrhea.  Abdominal Pain The primary symptoms of the illness include abdominal pain, nausea and diarrhea. The primary symptoms of the illness do not include fever or vomiting.  Additional symptoms associated with the illness include chills.  Diarrhea The primary symptoms include abdominal pain, nausea and diarrhea. Primary symptoms do not include fever or vomiting.  The illness is also significant for chills.   patient presents with complaints of abdominal pain and diarrhea.  Notably, the patient had pneumonia.  2 weeks ago, with subsequent norovirus infection and period of significant diarrhea.  She notes that she has been well for the past 5 days.  Today, just prior to calling EMS.  The patient had the acute onset of abdominal pain, diffuse, crampy, and subsequent significant episode of diarrhea.  The abdominal pain has subsided spontaneously, mildly, though the patient still has significant diarrhea.  On presentation.  She denies any vomiting, confusion, disorientation, chest pain, dyspnea  Past Medical History  Diagnosis Date  . Hypertension     She has a past hx of essential  . 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 Inflammation of joint of knee     Since we last last saw her she developed problems with an acute which required surgical drainage by her orthopedist Dr. Cleophas Dunker.  Marland Kitchen MRSA (methicillin resistant Staphylococcus aureus)     Knee surgery drainage was positive for MRSA and she was treated with 3 weeks of doxycycline successfully.   . Anemia     Past Hx  . Diarrhea     Mild  . Exogenous obesity     Past Surgical History  Procedure Date  . Knee surgery     No family history on file.  History  Substance Use Topics  . Smoking status: Current Some Day Smoker  . Smokeless tobacco: Not on file  . Alcohol Use:     OB History    Grav Para Term Preterm Abortions TAB SAB Ect Mult Living                  Review of Systems  Constitutional: Positive for chills. Negative for fever.  HENT: Negative.   Eyes: Negative.   Respiratory: Positive for cough.   Cardiovascular: Negative.   Gastrointestinal: Positive for nausea, abdominal pain and diarrhea. Negative for vomiting.  Genitourinary: Negative.   Musculoskeletal: Negative.   Skin: Negative.   Neurological: Positive for weakness and light-headedness. Negative for seizures and syncope.  Psychiatric/Behavioral: Negative.     Allergies  Codeine; Minitabs; Doxycycline; and Penicillins  Home Medications   Current Outpatient Rx  Name Route Sig Dispense Refill  . BENAZEPRIL HCL 20 MG PO TABS Oral Take 20 mg by mouth daily.      . BUPROPION HCL ER (XL) 300 MG PO TB24 Oral Take 300 mg by mouth daily.      Marland Kitchen CALCIUM + D PO Oral Take 500 mg by mouth daily.      . CELECOXIB 200 MG PO CAPS Oral Take 200 mg by mouth daily.      Marland Kitchen  CYCLOBENZAPRINE HCL 10 MG PO TABS Oral Take 10 mg by mouth as needed. Muscle spasms    . ESTRADIOL 2 MG PO TABS Oral Take 2 mg by mouth daily.      Marland Kitchen HYDROCHLOROTHIAZIDE 25 MG PO TABS  Daily or as directed 30 tablet 11  . MULTIVITAMIN PO Oral Take by mouth.      Marland Kitchen NAPROXEN SODIUM 220 MG PO TABS Oral Take 220 mg by mouth 2 (two) times daily with a meal.    . PROMETHAZINE HCL 25 MG PO TABS Oral Take 25 mg by mouth every 6 (six) hours as needed. For nausea    . MONTELUKAST SODIUM 10 MG PO TABS Oral Take 10 mg by mouth as needed. For breathing nasal drainage congestion relief      BP 75/48  Pulse 104  Resp 22  SpO2 98%  Physical Exam    Nursing note and vitals reviewed. Constitutional: She is oriented to person, place, and time. She appears well-developed and well-nourished.  HENT:  Head: Normocephalic and atraumatic.  Eyes: Conjunctivae are normal. Pupils are equal, round, and reactive to light.  Cardiovascular: Regular rhythm and normal pulses.  Tachycardia present.   Pulmonary/Chest: Breath sounds normal. No stridor. Tachypnea noted.  Abdominal: Soft. Normal appearance. There is no tenderness.  Musculoskeletal: She exhibits no edema and no tenderness.  Neurological: She is alert and oriented to person, place, and time. No cranial nerve deficit. She exhibits normal muscle tone. Coordination normal.  Skin: Skin is warm and dry. No rash noted.  Psychiatric: She has a normal mood and affect.    ED Course  Procedures (including critical care time)   Labs Reviewed  CBC  DIFFERENTIAL  COMPREHENSIVE METABOLIC PANEL  LIPASE, BLOOD   No results found.   Date: 05/07/2011  Rate: 97  Rhythm: normal sinus rhythm  QRS Axis: left  Intervals: normal  ST/T Wave abnormalities: normal  Conduction Disutrbances:none  Narrative Interpretation:   Old EKG Reviewed: changes noted NORMAL ECG  No diagnosis found.  10:40 PM  The patient has continued to have numerous episodes of diarrhea, though she notes her abdominal pain is improved.  The patient's blood pressure remains low (70's/40's).  MDM  This 61 year old female presents with diarrhea and abdominal pain.  On exam the patient is uncomfortable appearing, tachycardic, hypotensive.  The patient's blood pressure responded minimally to 2 L of fluid, though she was awake, alert, appropriately, oriented throughout her emergency department.  Evaluation, thus far, the patient's diarrhea continued in spite of ED interventions given the patient's history of recent antibiotics for pneumonia and her recent episode of norovirus there is concern for Clostridium difficile or other  infectious etiologies.  The patient was started on Flagyl and continued to receive IV fluids, pending admission.        Gerhard Munch, MD 05/07/11 2243  CRITICAL CARE Performed by: Gerhard Munch   Total critical care time: 35  Critical care time was exclusive of separately billable procedures and treating other patients.  Critical care was necessary to treat or prevent imminent or life-threatening deterioration.  Critical care was time spent personally by me on the following activities: development of treatment plan with patient and/or surrogate as well as nursing, discussions with consultants, evaluation of patient's response to treatment, examination of patient, obtaining history from patient or surrogate, ordering and performing treatments and interventions, ordering and review of laboratory studies, ordering and review of radiographic studies, pulse oximetry and re-evaluation of patient's condition.  Gerhard Munch,  MD 05/07/11 2130  Gerhard Munch, MD 05/07/11 [id number]  Gerhard Munch, MD 05/07/11 2317

## 2011-05-08 ENCOUNTER — Inpatient Hospital Stay (HOSPITAL_COMMUNITY): Payer: BC Managed Care – PPO

## 2011-05-08 ENCOUNTER — Encounter (HOSPITAL_COMMUNITY): Payer: Self-pay | Admitting: *Deleted

## 2011-05-08 DIAGNOSIS — M549 Dorsalgia, unspecified: Secondary | ICD-10-CM | POA: Diagnosis present

## 2011-05-08 LAB — CBC
HCT: 26.5 % — ABNORMAL LOW (ref 36.0–46.0)
Hemoglobin: 9.1 g/dL — ABNORMAL LOW (ref 12.0–15.0)
MCH: 34.1 pg — ABNORMAL HIGH (ref 26.0–34.0)
MCHC: 34.3 g/dL (ref 30.0–36.0)
MCV: 99.3 fL (ref 78.0–100.0)
Platelets: 478 10*3/uL — ABNORMAL HIGH (ref 150–400)
RBC: 2.67 MIL/uL — ABNORMAL LOW (ref 3.87–5.11)
RDW: 16.7 % — ABNORMAL HIGH (ref 11.5–15.5)
WBC: 37 10*3/uL — ABNORMAL HIGH (ref 4.0–10.5)

## 2011-05-08 LAB — HEPATIC FUNCTION PANEL
Albumin: 2.8 g/dL — ABNORMAL LOW (ref 3.5–5.2)
Indirect Bilirubin: 0.3 mg/dL (ref 0.3–0.9)
Total Bilirubin: 0.5 mg/dL (ref 0.3–1.2)
Total Protein: 5.6 g/dL — ABNORMAL LOW (ref 6.0–8.3)

## 2011-05-08 LAB — MAGNESIUM: Magnesium: 1 mg/dL — ABNORMAL LOW (ref 1.5–2.5)

## 2011-05-08 LAB — BASIC METABOLIC PANEL
BUN: 25 mg/dL — ABNORMAL HIGH (ref 6–23)
Calcium: 7.7 mg/dL — ABNORMAL LOW (ref 8.4–10.5)
GFR calc Af Amer: 44 mL/min — ABNORMAL LOW (ref >90)
GFR calc non Af Amer: 38 mL/min — ABNORMAL LOW (ref >90)
Potassium: 4 mEq/L (ref 3.5–5.1)
Sodium: 130 mEq/L — ABNORMAL LOW (ref 135–145)

## 2011-05-08 LAB — PROCALCITONIN: Procalcitonin: 2.65 ng/mL

## 2011-05-08 LAB — CLOSTRIDIUM DIFFICILE BY PCR: Toxigenic C. Difficile by PCR: NEGATIVE

## 2011-05-08 MED ORDER — CIPROFLOXACIN IN D5W 400 MG/200ML IV SOLN
400.0000 mg | Freq: Two times a day (BID) | INTRAVENOUS | Status: DC
  Administered 2011-05-08 – 2011-05-14 (×13): 400 mg via INTRAVENOUS
  Filled 2011-05-08 (×14): qty 200

## 2011-05-08 MED ORDER — DICYCLOMINE HCL 10 MG PO CAPS
10.0000 mg | ORAL_CAPSULE | Freq: Once | ORAL | Status: AC
  Administered 2011-05-08: 10 mg via ORAL
  Filled 2011-05-08: qty 1

## 2011-05-08 MED ORDER — MAGNESIUM SULFATE 40 MG/ML IJ SOLN: 2.0000 g | Freq: Once | INTRAMUSCULAR | Status: DC

## 2011-05-08 MED ORDER — CHLORHEXIDINE GLUCONATE 0.12 % MT SOLN
15.0000 mL | Freq: Two times a day (BID) | OROMUCOSAL | Status: DC
  Administered 2011-05-08 – 2011-05-18 (×19): 15 mL via OROMUCOSAL
  Filled 2011-05-08 (×26): qty 15

## 2011-05-08 MED ORDER — GI COCKTAIL ~~LOC~~
30.0000 mL | Freq: Once | ORAL | Status: AC
  Administered 2011-05-08: 30 mL via ORAL
  Filled 2011-05-08: qty 30

## 2011-05-08 MED ORDER — FAMOTIDINE 20 MG PO TABS
20.0000 mg | ORAL_TABLET | Freq: Two times a day (BID) | ORAL | Status: DC
  Administered 2011-05-08 – 2011-05-10 (×5): 20 mg via ORAL
  Filled 2011-05-08 (×9): qty 1

## 2011-05-08 MED ORDER — ENOXAPARIN SODIUM 40 MG/0.4ML ~~LOC~~ SOLN
40.0000 mg | SUBCUTANEOUS | Status: DC
  Administered 2011-05-08: 40 mg via SUBCUTANEOUS
  Filled 2011-05-08: qty 0.4

## 2011-05-08 MED ORDER — PROMETHAZINE HCL 25 MG PO TABS
12.5000 mg | ORAL_TABLET | Freq: Four times a day (QID) | ORAL | Status: DC | PRN
  Administered 2011-05-09: 12.5 mg via ORAL

## 2011-05-08 MED ORDER — MAGNESIUM SULFATE 50 % IJ SOLN
Freq: Once | INTRAMUSCULAR | Status: AC
  Administered 2011-05-08: 14:00:00 via INTRAVENOUS
  Filled 2011-05-08: qty 50

## 2011-05-08 MED ORDER — PROMETHAZINE HCL 25 MG/ML IJ SOLN
12.5000 mg | Freq: Four times a day (QID) | INTRAMUSCULAR | Status: DC | PRN
  Administered 2011-05-08 (×2): 12.5 mg via INTRAVENOUS
  Filled 2011-05-08 (×4): qty 1

## 2011-05-08 MED ORDER — CYCLOBENZAPRINE HCL 5 MG PO TABS
5.0000 mg | ORAL_TABLET | Freq: Three times a day (TID) | ORAL | Status: DC
  Administered 2011-05-08 – 2011-05-20 (×37): 5 mg via ORAL
  Filled 2011-05-08 (×39): qty 1

## 2011-05-08 MED ORDER — SODIUM CHLORIDE 0.9 % IV SOLN: INTRAVENOUS | Status: DC

## 2011-05-08 MED ORDER — BUPROPION HCL ER (XL) 300 MG PO TB24
300.0000 mg | ORAL_TABLET | Freq: Every day | ORAL | Status: DC
  Administered 2011-05-08 – 2011-05-20 (×13): 300 mg via ORAL
  Filled 2011-05-08 (×13): qty 1

## 2011-05-08 MED ORDER — ZOLPIDEM TARTRATE 5 MG PO TABS: 5.0000 mg | ORAL_TABLET | Freq: Every evening | ORAL | Status: DC | PRN

## 2011-05-08 MED ORDER — FAMOTIDINE 40 MG/5ML PO SUSR
20.0000 mg | Freq: Two times a day (BID) | ORAL | Status: DC
  Filled 2011-05-08 (×2): qty 2.5

## 2011-05-08 MED ORDER — SODIUM CHLORIDE 0.9 % IV SOLN
INTRAVENOUS | Status: DC
  Administered 2011-05-08 – 2011-05-09 (×2): via INTRAVENOUS

## 2011-05-08 MED ORDER — BIOTENE DRY MOUTH MT LIQD
15.0000 mL | Freq: Two times a day (BID) | OROMUCOSAL | Status: DC
  Administered 2011-05-08 – 2011-05-16 (×18): 15 mL via OROMUCOSAL

## 2011-05-08 NOTE — H&P (Signed)
Hospital Admission Note Date: 05/08/2011  Patient name: [name] Medical record number: [id number] Date of birth: 06/19/1950 Age: 61 y.o. Gender: female PCP: No primary provider on file.  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 Had acute onset crampy abdominal pain, diarrhea, 10+ loose, "explosive" stools. Became dejhydrated at home and fell but no significant injury called EMS and came to ED for evaluation. No exposures or close sick contacts.   Meds: Medications Prior to Admission  Medication Dose Route Frequency Provider Last Rate Last Dose  . 0.9 %  sodium chloride infusion  999 mL Intravenous Once Gerhard Munch, MD 1,000 mL/hr at 05/07/11 2041 1,000 mL at 05/07/11 2041  . 0.9 %  sodium chloride infusion   Intravenous STAT Gerhard Munch, MD      . 0.9 %  sodium chloride infusion   Intravenous Continuous Anderson Malta, DO      . dicyclomine (BENTYL) capsule 10 mg  10 mg Oral Once Gerhard Munch, MD   10 mg at 05/07/11 2042  . loperamide (IMODIUM) capsule 4 mg  4 mg Oral PRN Gerhard Munch, MD   4 mg at 05/07/11 2126  . metroNIDAZOLE (FLAGYL) IVPB 500 mg  500 mg Intravenous Once Gerhard Munch, MD   500 mg at 05/07/11 2317  . vancomycin (VANCOCIN) 50 mg/mL oral solution 500 mg  500 mg Oral Q6H Anderson Malta, DO       And  . metroNIDAZOLE (FLAGYL) IVPB 500 mg  500 mg Intravenous Q8H Anderson Malta, DO      . morphine 2 MG/ML injection 2 mg  2 mg Intravenous Q3H PRN Anderson Malta, DO      . ondansetron [facility]) injection 4 mg  4 mg Intravenous Once Gerhard Munch, MD   4 mg at 05/07/11 2042  . ondansetron (ZOFRAN) injection 4 mg  4 mg Intravenous Q8H PRN Gerhard Munch, MD       Medications Prior to Admission  Medication Sig Dispense Refill  . benazepril  (LOTENSIN) 20 MG tablet Take 20 mg by mouth daily.        Marland Kitchen buPROPion (WELLBUTRIN XL) 300 MG 24 hr tablet Take 300 mg by mouth daily.        . Calcium Carbonate-Vitamin D (CALCIUM + D PO) Take 500 mg by mouth daily.        . celecoxib (CELEBREX) 200 MG capsule Take 200 mg by mouth daily.        . cyclobenzaprine (FLEXERIL) 10 MG tablet Take 10 mg by mouth as needed. Muscle spasms      . estradiol (ESTRACE) 2 MG tablet Take 2 mg by mouth daily.        . hydrochlorothiazide 25 MG tablet Daily or as directed  30 tablet  11  . Multiple Vitamin (MULTIVITAMIN PO) Take by mouth.        . montelukast (SINGULAIR) 10 MG tablet Take 10 mg by mouth as needed. For breathing nasal drainage congestion relief        Allergies: Codeine; Minitabs; Doxycycline; and Penicillins Past Medical History  Diagnosis Date  . Hypertension     She has a past hx of essential  . Elevated liver function tests     She also has a past hx of chronically studies  felt to be secondary to Celebrex  . Inflammation of joint of knee     Since we last last saw her she developed problems with an acute which required surgical drainage by her orthopedist Dr. Cleophas Dunker.  Marland Kitchen MRSA (methicillin resistant Staphylococcus aureus)     Knee surgery drainage was positive for MRSA and she was treated with 3 weeks of doxycycline successfully.  . Anemia     Past Hx  . Diarrhea     Mild  . Exogenous obesity    Past Surgical History  Procedure Date  . Knee surgery    No family history on file. History   Social History  . Marital Status: Divorced    Spouse Name: N/A    Number of Children: N/A  . Years of Education: N/A   Occupational History  . Not on file.   Social History Main Topics  . Smoking status: Current Some Day Smoker  . Smokeless tobacco: Not on file  . Alcohol Use:   . Drug Use:   . Sexually Active:    Other Topics Concern  . Not on file   Social History Narrative  . No narrative on file    Review of  Systems: Pertinent items are noted in HPI.  Physical Exam: Blood pressure 71/39, pulse 98, resp. rate 20, SpO2 100.00%. BP 91/56  Pulse 103  Resp 20  SpO2 96%  General Appearance:    Alert, cooperative, mild distress, slightly toxic appearsing, having chills, appears stated age  Head:    Normocephalic, without obvious abnormality, atraumatic  Eyes:    PERRL, conjunctiva/corneas clear both eyes     Nose:   Nares normal, septum midline, mucosa normal, no drainage    or sinus tenderness  Throat:   Dry Lips, mucosa, and tongue normal; teeth and gums normal  Neck:   Supple, symmetrical, trachea midline, no adenopathy;    thyroid:  no enlargement/tenderness/nodules; no carotid   bruit or JVD     Lungs:     Clear to auscultation bilaterally, respirations unlabored      Heart:    Regular rate and rhythm, S1 and S2 normal, no murmur, rub   or gallop     Abdomen:     Soft, mildly tender, bowel sounds active all four quadrants,    no masses, no organomegaly        Extremities:   Extremities normal, atraumatic, no cyanosis or edema     Skin:   Skin color, texture, turgor normal, no rashes or lesions     Lab results: Basic Metabolic Panel:  [facility] 05/07/11 2045  NA 133*  K 4.8  CL 99  CO2 21  GLUCOSE 169*  BUN 22  CREATININE 1.03  CALCIUM 9.8  MG --  PHOS --   Liver Function Tests:  [facility] 05/07/11 2045  AST 44*  ALT 43*  ALKPHOS 332*  BILITOT 0.8  PROT 7.1  ALBUMIN 3.8    Basename 05/07/11 2045  LIPASE 39  AMYLASE --   No results found for this basename: AMMONIA:2 in the last 72 hours CBC:  Basename 05/07/11 2045  WBC 18.2*  NEUTROABS 14.2*  HGB 11.3*  HCT 33.7*  MCV 99.4  PLT 589*   No results found.  Other results: EKG: Sinus tachycardia  Assessment & Plan by Problem: Patient Active Problem List  Diagnoses  . Diarrhea/Hypovolemia Most likely C. Diff correlates with recent antibiotic use/cephalosporin, leukocytosis, hypotension. C. Diff order  set initiated. PCR pending. VancIV allergy  so just started Flagyl IV for now, may need to consult ID for further recs, she may do fine on Flagyl alone, but due to hypotension and WBC count. I think she needs more intensive treatment. Aggressive IV hydration.Advance diet as tolerated. Step-down because of hypotension/early sepsis risk.  . Dehydration due to #1: IV fluids monitor BMET  . HTN: home meds being held  . Anemia: will monitor Hb  . Recent PNA/viral URI: resolved, will monitor for additional symptoms.     SignedAnderson Malta 05/08/2011, 12:10 AM

## 2011-05-08 NOTE — Consult Note (Signed)
[facility] Consult Note  Referring Provider: No ref. provider found Primary Care Physician:  No primary provider on file. Primary Gastroenterologist:  Dr.  Antony Contras Complaint: Diarrhea HPI: [name] is an 61 y.o. white female.  Admitted with sudden onset of explosive abdominal cramps and frothy diarrhea. She was treated with Rocephin for pneumonia ending 7 days ago. She had low blood pressure and was near syncopal in the emergency room but her blood pressure stabilized. Her white blood cell count was 18,000 on admission and 39,000 now. Her creatinine has risen slightly. She denies any recent travel. She had a C. difficile toxin that was negative. She has been started on empiric Flagyl. She is allergic to vancomycin. Her creatinine is slightly elevated at 1.43 she's never had a colonoscopy.  Past Medical History  Diagnosis Date  . Hypertension     She has a past hx of essential  . 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 Inflammation of joint of knee     Since we last last saw her she developed problems with an acute which required surgical drainage by her orthopedist Dr. Cleophas Dunker.  Marland Kitchen MRSA (methicillin resistant Staphylococcus aureus)     Knee surgery drainage was positive for MRSA and she was treated with 3 weeks of doxycycline successfully.  . Anemia     Past Hx  . Diarrhea     Mild  . Exogenous obesity     Past Surgical History  Procedure Date  . Knee surgery     Medications Prior to Admission  Medication Dose Route Frequency Provider Last Rate Last Dose  . 0.9 %  sodium chloride infusion  999 mL Intravenous Once Gerhard Munch, MD 1,000 mL/hr at 05/07/11 2041 1,000 mL at 05/07/11 2041  . 0.9 %  sodium chloride infusion   Intravenous Continuous Leroy Sea, MD 150 mL/hr at 05/08/11 0804    . antiseptic oral rinse (BIOTENE) solution 15 mL  15 mL Mouth Rinse q12n4p Leroy Sea,  MD      . buPROPion (WELLBUTRIN XL) 24 hr tablet 300 mg  300 mg Oral Daily Leroy Sea, MD   300 mg at 05/08/11 0904  . chlorhexidine (PERIDEX) 0.12 % solution 15 mL  15 mL Mouth Rinse BID Leroy Sea, MD   15 mL at 05/08/11 0806  . cyclobenzaprine (FLEXERIL) tablet 5 mg  5 mg Oral TID Leroy Sea, MD   5 mg at 05/08/11 0903  . dicyclomine (BENTYL) capsule 10 mg  10 mg Oral Once Gerhard Munch, MD   10 mg at 05/07/11 2042  . dicyclomine (BENTYL) capsule 10 mg  10 mg Oral Once Anderson Malta, DO   10 mg at 05/08/11 0107  . enoxaparin (LOVENOX) injection 40 mg  40 mg Subcutaneous Q24H Anderson Malta, DO   40 mg at 05/08/11 1478  . famotidine (PEPCID) tablet 20 mg  20 mg Oral BID Clance Boll, PHARMD      . gi cocktail  30 mL Oral Once Leroy Sea, MD      . metroNIDAZOLE (FLAGYL) IVPB 500 mg  500 mg Intravenous Once Gerhard Munch, MD   500 mg at 05/07/11 2317  . metroNIDAZOLE (FLAGYL) IVPB 500 mg  500 mg Intravenous Q8H Anderson Malta, DO   500 mg at 05/08/11 0804  . morphine 2 MG/ML injection 2 mg  2 mg Intravenous Q3H  PRN Anderson Malta, DO   2 mg at 05/08/11 0904  . ondansetron (ZOFRAN) injection 4 mg  4 mg Intravenous Once Gerhard Munch, MD   4 mg at 05/07/11 2042  . ondansetron (ZOFRAN) injection 4 mg  4 mg Intravenous Q8H PRN Gerhard Munch, MD   4 mg at 05/08/11 0336  . promethazine (PHENERGAN) tablet 12.5 mg  12.5 mg Oral Q6H PRN Anderson Malta, DO       Or  . promethazine (PHENERGAN) injection 12.5 mg  12.5 mg Intravenous Q6H PRN Anderson Malta, DO   12.5 mg at 05/08/11 0522  . sodium chloride 0.9 % 50 mL with magnesium sulfate 2 g infusion   Intravenous Once Reece Packer, PHARMD      . zolpidem (AMBIEN) tablet 5 mg  5 mg Oral QHS PRN Anderson Malta, DO      . DISCONTD: 0.9 %  sodium chloride infusion   Intravenous STAT Gerhard Munch, MD      . DISCONTD: 0.9 %  sodium chloride infusion   Intravenous Continuous Anderson Malta,  DO      . DISCONTD: 0.9 %  sodium chloride infusion   Intravenous Continuous Leroy Sea, MD      . DISCONTD: famotidine (PEPCID) 40 MG/5ML suspension 20 mg  20 mg Oral BID Leroy Sea, MD      . DISCONTD: loperamide (IMODIUM) capsule 4 mg  4 mg Oral PRN Gerhard Munch, MD   4 mg at 05/07/11 2126  . DISCONTD: magnesium sulfate IVPB 2 g 50 mL  2 g Intravenous Once Leroy Sea, MD      . DISCONTD: magnesium sulfate IVPB 2 g 50 mL  2 g Intravenous Once Reece Packer, PHARMD      . DISCONTD: vancomycin (VANCOCIN) 50 mg/mL oral solution 500 mg  500 mg Oral Q6H Anderson Malta, DO       Medications Prior to Admission  Medication Sig Dispense Refill  . benazepril (LOTENSIN) 20 MG tablet Take 20 mg by mouth daily.        Marland Kitchen buPROPion (WELLBUTRIN XL) 300 MG 24 hr tablet Take 300 mg by mouth daily.       . Calcium Carbonate-Vitamin D (CALCIUM + D PO) Take 500 mg by mouth daily.        . celecoxib (CELEBREX) 200 MG capsule Take 200 mg by mouth daily.        . cyclobenzaprine (FLEXERIL) 10 MG tablet Take 10 mg by mouth as needed. Muscle spasms      . estradiol (ESTRACE) 2 MG tablet Take 2 mg by mouth daily.        . hydrochlorothiazide 25 MG tablet Daily or as directed  30 tablet  11  . Multiple Vitamin (MULTIVITAMIN PO) Take by mouth.        . montelukast (SINGULAIR) 10 MG tablet Take 10 mg by mouth as needed. For breathing nasal drainage congestion relief        Allergies:  Allergies  Allergen Reactions  . Codeine Nausea And Vomiting  . Minitabs (Aspirin) Nausea Only  . Doxycycline Rash  . Penicillins Hives and Rash    History reviewed. No pertinent family history.  Social History:  reports that she has been smoking.  She does not have any smokeless tobacco history on file. She reports that she does not drink alcohol or use illicit drugs.  Negative except for as above   Blood pressure 98/58, pulse 107, temperature 99.1 F (37.3 C),  temperature source Axillary,  resp. rate 22, height 5\' 6"  (1.676 m), weight 90.3 kg (199 lb 1.2 oz), SpO2 93.00%. Head: Normocephalic, without obvious abnormality, atraumatic Neck: no adenopathy, no carotid bruit, no JVD, supple, symmetrical, trachea midline and thyroid not enlarged, symmetric, no tenderness/mass/nodules Resp: clear to auscultation bilaterally Cardio: regular rate and rhythm, S1, S2 normal, no murmur, click, rub or gallop GI: Positive bowel sounds, nondistended, mild diffuse tenderness without guarding or rebound. Extremities: extremities normal, atraumatic, no cyanosis or edema  Results for orders placed during the hospital encounter of 05/07/11 (from the past 48 hour(s))  CBC     Status: Abnormal   Collection Time   05/07/11  8:45 PM      Component Value Range Comment   WBC 18.2 (*) 4.0 - 10.5 (K/uL)    RBC 3.39 (*) 3.87 - 5.11 (MIL/uL)    Hemoglobin 11.3 (*) 12.0 - 15.0 (g/dL)    HCT 16.1 (*) 09.6 - 46.0 (%)    MCV 99.4  78.0 - 100.0 (fL)    MCH 33.3  26.0 - 34.0 (pg)    MCHC 33.5  30.0 - 36.0 (g/dL)    RDW 04.5 (*) 40.9 - 15.5 (%)    Platelets 589 (*) 150 - 400 (K/uL)   DIFFERENTIAL     Status: Abnormal   Collection Time   05/07/11  8:45 PM      Component Value Range Comment   Neutrophils Relative 78 (*) 43 - 77 (%)    Lymphocytes Relative 8 (*) 12 - 46 (%)    Monocytes Relative 14 (*) 3 - 12 (%)    Eosinophils Relative 0  0 - 5 (%)    Basophils Relative 0  0 - 1 (%)    Neutro Abs 14.2 (*) 1.7 - 7.7 (K/uL)    Lymphs Abs 1.5  0.7 - 4.0 (K/uL)    Monocytes Absolute 2.5 (*) 0.1 - 1.0 (K/uL)    Eosinophils Absolute 0.0  0.0 - 0.7 (K/uL)    Basophils Absolute 0.0  0.0 - 0.1 (K/uL)    WBC Morphology TOXIC GRANULATION   DOHLE BODIES   Smear Review LARGE PLATELETS PRESENT     COMPREHENSIVE METABOLIC PANEL     Status: Abnormal   Collection Time   05/07/11  8:45 PM      Component Value Range Comment   Sodium 133 (*) 135 - 145 (mEq/L)    Potassium 4.8  3.5 - 5.1 (mEq/L)    Chloride 99  96 - 112  (mEq/L)    CO2 21  19 - 32 (mEq/L)    Glucose, Bld 169 (*) 70 - 99 (mg/dL)    BUN 22  6 - 23 (mg/dL)    Creatinine, Ser 8.11  0.50 - 1.10 (mg/dL)    Calcium 9.8  8.4 - 10.5 (mg/dL)    Total Protein 7.1  6.0 - 8.3 (g/dL)    Albumin 3.8  3.5 - 5.2 (g/dL)    AST 44 (*) 0 - 37 (U/L)    ALT 43 (*) 0 - 35 (U/L)    Alkaline Phosphatase 332 (*) 39 - 117 (U/L)    Total Bilirubin 0.8  0.3 - 1.2 (mg/dL)    GFR calc non Af Amer 57 (*) >90 (mL/min)    GFR calc Af Amer 67 (*) >90 (mL/min)   LIPASE, BLOOD     Status: Normal   Collection Time   05/07/11  8:45 PM      Component Value Range  Comment   Lipase 39  11 - 59 (U/L)   CLOSTRIDIUM DIFFICILE BY PCR     Status: Normal   Collection Time   05/07/11 10:22 PM      Component Value Range Comment   C difficile by pcr NEGATIVE  NEGATIVE    MRSA PCR SCREENING     Status: Abnormal   Collection Time   05/08/11  5:04 AM      Component Value Range Comment   MRSA by PCR POSITIVE (*) NEGATIVE    CBC     Status: Abnormal   Collection Time   05/08/11  9:08 AM      Component Value Range Comment   WBC 37.0 (*) 4.0 - 10.5 (K/uL) WHITE COUNT CONFIRMED ON SMEAR   RBC 2.67 (*) 3.87 - 5.11 (MIL/uL)    Hemoglobin 9.1 (*) 12.0 - 15.0 (g/dL)    HCT 16.1 (*) 09.6 - 46.0 (%)    MCV 99.3  78.0 - 100.0 (fL)    MCH 34.1 (*) 26.0 - 34.0 (pg)    MCHC 34.3  30.0 - 36.0 (g/dL)    RDW 04.5 (*) 40.9 - 15.5 (%)    Platelets 478 (*) 150 - 400 (K/uL)   BASIC METABOLIC PANEL     Status: Abnormal   Collection Time   05/08/11  9:08 AM      Component Value Range Comment   Sodium 130 (*) 135 - 145 (mEq/L)    Potassium 4.0  3.5 - 5.1 (mEq/L)    Chloride 99  96 - 112 (mEq/L)    CO2 18 (*) 19 - 32 (mEq/L)    Glucose, Bld 140 (*) 70 - 99 (mg/dL)    BUN 25 (*) 6 - 23 (mg/dL)    Creatinine, Ser 8.11 (*) 0.50 - 1.10 (mg/dL)    Calcium 7.7 (*) 8.4 - 10.5 (mg/dL)    GFR calc non Af Amer 38 (*) >90 (mL/min)    GFR calc Af Amer 44 (*) >90 (mL/min)   MAGNESIUM     Status: Abnormal    Collection Time   05/08/11  9:08 AM      Component Value Range Comment   Magnesium 1.0 (*) 1.5 - 2.5 (mg/dL)   HEPATIC FUNCTION PANEL     Status: Abnormal   Collection Time   05/08/11  9:08 AM      Component Value Range Comment   Total Protein 5.6 (*) 6.0 - 8.3 (g/dL)    Albumin 2.8 (*) 3.5 - 5.2 (g/dL)    AST 28  0 - 37 (U/L)    ALT 29  0 - 35 (U/L)    Alkaline Phosphatase 203 (*) 39 - 117 (U/L)    Total Bilirubin 0.5  0.3 - 1.2 (mg/dL)    Bilirubin, Direct 0.2  0.0 - 0.3 (mg/dL)    Indirect Bilirubin 0.3  0.3 - 0.9 (mg/dL)    Dg Chest Port 1 View  05/08/2011  *RADIOLOGY REPORT*  Clinical Data: Fatigue, cough  PORTABLE CHEST - 1 VIEW  Comparison: 07/19/2007  Findings: Low lung volumes with vascular crowding.  No frank interstitial edema. No pleural effusion or pneumothorax.  Cardiomediastinal silhouette is within normal limits.  IMPRESSION: Low lung volumes with vascular crowding.  Original Report Authenticated By: Charline Bills, M.D.   Dg Abd Portable 1v  05/08/2011  *RADIOLOGY REPORT*  Clinical Data: Abdominal pain  PORTABLE ABDOMEN - 1 VIEW  Comparison: [facility] CT abdomen pelvis dated 06/23/2008  Findings: Mildly dilated loops  of small bowel in the lower abdomen. Mildly dilated loops of ascending/transverse colon.  This appearance favors adynamic ileus over small bowel obstruction.  Surgical sutures in the pelvis.  Degenerative changes of the visualized thoracolumbar spine. Degenerative changes of the left hip.  IMPRESSION: Mildly dilated loops of small bowel and colon, favoring adynamic ileus over small bowel obstruction.  Original Report Authenticated By: Charline Bills, M.D.    Assessment: Acute onset explosive diarrhea with some hemodynamic instability initially with leukocytosis.  Plan:  Agree with empiric metronidazole and would add Cipro while awaiting stool studies. Will obtain noncontrast CT of the abdomen. Will consider unprepped flexible sigmoidoscopy is felt  to aid in diagnosis. Would hydrate her aggressively. Shihab States C 05/08/2011, 11:07 AM

## 2011-05-08 NOTE — Progress Notes (Addendum)
[name] CSN:[id number],MRN:[id number] is a 61 y.o. female,  Outpatient Primary MD for the patient is No primary provider on file.  Chief Complaint  Patient presents with  . Abdominal Pain  . Hypotension  . Diarrhea        Subjective:   [name] today has, No headache, No chest pain, No new weakness tingling or numbness, No Cough - SOB.   On 05-08-11 generalized crampy non radiating Abd pain and nausea   Objective:   Filed Vitals:   05/07/11 2051 05/08/11 0109 05/08/11 0430 05/08/11 0500  BP: 71/39 91/56 110/95   Pulse: 98 103 118 117  Temp:   98.4 F (36.9 C)   TempSrc:   Oral   Resp: 20  23   Height:   5\' 6"  (1.676 m)   Weight:   90.3 kg (199 lb 1.2 oz)   SpO2: 100% 96% 87%     Wt Readings from Last 3 Encounters:  05/08/11 90.3 kg (199 lb 1.2 oz)     Intake/Output Summary (Last 24 hours) at 05/08/11 0805 Last data filed at 05/08/11 0600  Gross per 24 hour  Intake    300 ml  Output    650 ml  Net   -350 ml    Exam Awake Alert, Oriented *3, No new F.N deficits, Normal affect [place].AT,PERRAL Supple Neck,No JVD, No cervical lymphadenopathy appriciated.  Symmetrical Chest wall movement, Good air movement bilaterally, CTAB RRR,No Gallops,Rubs or new Murmurs, No Parasternal Heave +ve B.Sounds, Abd Soft but some generalized tenderness, No organomegaly appriciated, No rebound -guarding or rigidity. No Cyanosis, Clubbing or edema, No new Rash or bruise     Data Review  CBC  Lab 05/07/11 2045  WBC 18.2*  HGB 11.3*  HCT 33.7*  PLT 589*  MCV 99.4  MCH 33.3  MCHC 33.5  RDW 16.5*  LYMPHSABS 1.5  MONOABS 2.5*  EOSABS 0.0  BASOSABS 0.0  BANDABS --    Chemistries   Lab 05/07/11 2045  NA 133*  K 4.8  CL 99  CO2 21  GLUCOSE 169*  BUN 22  CREATININE 1.03  CALCIUM 9.8  MG --  AST 44*  ALT 43*  ALKPHOS 332*  BILITOT 0.8   ------------------------------------------------------------------------------------------------------------------ estimated creatinine  clearance is 64.9 ml/min (by C-G formula based on Cr of 1.03). ------------------------------------------------------------------------------------------------------------------ No results found for this basename: HGBA1C:2 in the last 72 hours ------------------------------------------------------------------------------------------------------------------ No results found for this basename: CHOL:2,HDL:2,LDLCALC:2,TRIG:2,CHOLHDL:2,LDLDIRECT:2 in the last 72 hours ------------------------------------------------------------------------------------------------------------------ No results found for this basename: TSH,T4TOTAL,FREET3,T3FREE,THYROIDAB in the last 72 hours ------------------------------------------------------------------------------------------------------------------ No results found for this basename: VITAMINB12:2,FOLATE:2,FERRITIN:2,TIBC:2,IRON:2,RETICCTPCT:2 in the last 72 hours  Coagulation profile No results found for this basename: INR:5,PROTIME:5 in the last 168 hours  No results found for this basename: DDIMER:2 in the last 72 hours  Cardiac Enzymes No results found for this basename: CK:3,CKMB:3,TROPONINI:3,MYOGLOBIN:3 in the last 168 hours ------------------------------------------------------------------------------------------------------------------ No components found with this basename: POCBNP:3  Micro Results Recent Results (from the past 240 hour(s))  MRSA PCR SCREENING     Status: Abnormal   Collection Time   05/08/11  5:04 AM      Component Value Range Status Comment   MRSA by PCR POSITIVE (*) NEGATIVE  Final     Radiology Reports No results found.  Scheduled Meds:   . sodium chloride  999 mL Intravenous Once  . antiseptic oral rinse  15 mL Mouth Rinse q12n4p  . buPROPion  300 mg Oral Daily  . chlorhexidine  15 mL Mouth Rinse  BID  . cyclobenzaprine  5 mg Oral TID  . dicyclomine  10 mg Oral Once  . dicyclomine  10 mg Oral Once  . enoxaparin  40  mg Subcutaneous Q24H  . metronidazole  500 mg Intravenous Once  . metronidazole  500 mg Intravenous Q8H  . ondansetron  4 mg Intravenous Once  . DISCONTD: sodium chloride   Intravenous STAT  . DISCONTD: vancomycin  500 mg Oral Q6H   Continuous Infusions:   . sodium chloride 150 mL/hr at 05/08/11 0804  . DISCONTD: sodium chloride    . DISCONTD: sodium chloride     PRN Meds:.morphine injection, ondansetron (ZOFRAN) IV, promethazine, promethazine, zolpidem, DISCONTD: loperamide  Assessment & Plan   1. Sudden onset of Diarrhea causing dehydration and Hypotension - in a pt with recent ABX exposure and ? outpt diagnosis of Norovirus - C diff is not ruled out and still pending, will DC Imodium for now, continue flagyl, get KUB, IVF and PRN antiemetics, may need GI input if not better.  Addendum - C Diff PCR negative - WBC upto 32K, still Nauseated, ++ diarrhea- will get non contrast CT stat, stool cult with O&P, repeat C diff again (?++ leukocytosis) add Cipro IV to IV Flagyl for Bacterial colitis, D/W Dr Madilyn Fireman GI he will see her ASAP.   2. Hypertension - BP low due to #1, hold BP meds, IVF.   3. AOCD - monitor H&H - outpt workup.   4. Pure hypercholesterolemia - home meds when able to tolerate PO.   5.Chronic back pain and spasms - pain control, on flexeril will continue.   6.H/O Recent PNA - get CXR    7. Elevated Transaminases - no RUQ  Tenderness - monitor levels.   8. Acute renal insufficiency - from azotemia due to #1, IVF, avoid nephrotoxins.   9. Low Mag - repalce IV   Ordered am labs stat   DVT Prophylaxis  Lovenox    See all Orders from today for further details     Leroy Sea M.D on 05/08/2011 at 8:05 AM  [facility] Office  [phone]

## 2011-05-09 ENCOUNTER — Inpatient Hospital Stay (HOSPITAL_COMMUNITY): Payer: BC Managed Care – PPO

## 2011-05-09 ENCOUNTER — Other Ambulatory Visit: Payer: Self-pay

## 2011-05-09 DIAGNOSIS — I517 Cardiomegaly: Secondary | ICD-10-CM

## 2011-05-09 DIAGNOSIS — I4891 Unspecified atrial fibrillation: Secondary | ICD-10-CM | POA: Diagnosis not present

## 2011-05-09 LAB — CBC
HCT: 22.2 % — ABNORMAL LOW (ref 36.0–46.0)
MCH: 34.5 pg — ABNORMAL HIGH (ref 26.0–34.0)
MCV: 98.2 fL (ref 78.0–100.0)
RBC: 2.26 MIL/uL — ABNORMAL LOW (ref 3.87–5.11)
RDW: 17 % — ABNORMAL HIGH (ref 11.5–15.5)
WBC: 26.6 10*3/uL — ABNORMAL HIGH (ref 4.0–10.5)

## 2011-05-09 LAB — OSMOLALITY: Osmolality: 266 mOsm/kg — ABNORMAL LOW (ref 275–300)

## 2011-05-09 LAB — SODIUM, URINE, RANDOM: Sodium, Ur: 11 mEq/L

## 2011-05-09 LAB — COMPREHENSIVE METABOLIC PANEL
BUN: 34 mg/dL — ABNORMAL HIGH (ref 6–23)
CO2: 16 mEq/L — ABNORMAL LOW (ref 19–32)
Chloride: 94 mEq/L — ABNORMAL LOW (ref 96–112)
Creatinine, Ser: 1.33 mg/dL — ABNORMAL HIGH (ref 0.50–1.10)
GFR calc non Af Amer: 42 mL/min — ABNORMAL LOW (ref >90)
Total Bilirubin: 0.4 mg/dL (ref 0.3–1.2)

## 2011-05-09 LAB — MAGNESIUM: Magnesium: 1.6 mg/dL (ref 1.5–2.5)

## 2011-05-09 LAB — CARDIAC PANEL(CRET KIN+CKTOT+MB+TROPI)
Relative Index: INVALID (ref 0.0–2.5)
Troponin I: <0.3 ng/mL (ref <0.30)

## 2011-05-09 LAB — OSMOLALITY, URINE: Osmolality, Ur: 338 mOsm/kg — ABNORMAL LOW (ref 390–1090)

## 2011-05-09 LAB — TSH: TSH: 2.474 u[IU]/mL (ref 0.350–4.500)

## 2011-05-09 MED ORDER — CHLORHEXIDINE GLUCONATE CLOTH 2 % EX PADS
6.0000 | MEDICATED_PAD | Freq: Every day | CUTANEOUS | Status: AC
  Administered 2011-05-09 – 2011-05-13 (×2): 6 via TOPICAL

## 2011-05-09 MED ORDER — SODIUM CHLORIDE 0.9 % IV BOLUS (SEPSIS)
1000.0000 mL | Freq: Once | INTRAVENOUS | Status: AC
  Administered 2011-05-09: 1000 mL via INTRAVENOUS

## 2011-05-09 MED ORDER — MAGNESIUM SULFATE IN D5W 10-5 MG/ML-% IV SOLN
1.0000 g | Freq: Once | INTRAVENOUS | Status: AC
  Administered 2011-05-09: 1 g via INTRAVENOUS
  Filled 2011-05-09: qty 100

## 2011-05-09 MED ORDER — MUPIROCIN 2 % EX OINT
1.0000 "application " | TOPICAL_OINTMENT | Freq: Two times a day (BID) | CUTANEOUS | Status: AC
  Administered 2011-05-09 – 2011-05-13 (×10): 1 via NASAL
  Filled 2011-05-09 (×2): qty 22

## 2011-05-09 MED ORDER — SODIUM CHLORIDE 0.9 % IV SOLN
INTRAVENOUS | Status: AC
  Administered 2011-05-09 – 2011-05-10 (×2): via INTRAVENOUS

## 2011-05-09 MED ORDER — OXYCODONE HCL 5 MG PO TABS
5.0000 mg | ORAL_TABLET | ORAL | Status: DC | PRN
  Administered 2011-05-12 – 2011-05-20 (×23): 5 mg via ORAL
  Filled 2011-05-09 (×24): qty 1

## 2011-05-09 MED ORDER — PROMETHAZINE HCL 25 MG PO TABS: 12.5000 mg | ORAL_TABLET | ORAL | Status: DC | PRN

## 2011-05-09 MED ORDER — PROMETHAZINE HCL 25 MG/ML IJ SOLN
12.5000 mg | INTRAMUSCULAR | Status: DC | PRN
  Administered 2011-05-09 – 2011-05-12 (×8): 12.5 mg via INTRAVENOUS
  Filled 2011-05-09 (×8): qty 1

## 2011-05-09 MED ORDER — DILTIAZEM HCL 100 MG IV SOLR
5.0000 mg/h | INTRAVENOUS | Status: DC
  Administered 2011-05-09: 5 mg/h via INTRAVENOUS
  Administered 2011-05-10: 10 mg/h via INTRAVENOUS
  Filled 2011-05-09: qty 100

## 2011-05-09 MED ORDER — PROMETHAZINE HCL 25 MG/ML IJ SOLN
12.5000 mg | Freq: Once | INTRAMUSCULAR | Status: AC
  Administered 2011-05-09: 12.5 mg via INTRAVENOUS

## 2011-05-09 MED ORDER — METOPROLOL TARTRATE 1 MG/ML IV SOLN
5.0000 mg | INTRAVENOUS | Status: DC | PRN
  Administered 2011-05-09: 5 mg via INTRAVENOUS
  Filled 2011-05-09: qty 5

## 2011-05-09 NOTE — Progress Notes (Addendum)
[name] CSN:[id number],MRN:[id number] is a 61 y.o. female,  Outpatient Primary MD for the patient is No primary provider on file.  Chief Complaint  Patient presents with  . Abdominal Pain  . Hypotension  . Diarrhea        Subjective:   [name] today has, No headache, No chest pain, No new weakness tingling or numbness, No Cough - SOB.   On 05-09-11 improvement in generalized crampy non radiating Abd pain, diarrhea and nausea   Objective:   Filed Vitals:   05/08/11 1600 05/08/11 2000 05/09/11 0000 05/09/11 0400  BP: 113/69 109/58 93/55 112/58  Pulse: 109 103 104 104  Temp: 98 F (36.7 C) 98.8 F (37.1 C) 97.7 F (36.5 C) 98.6 F (37 C)  TempSrc: Oral Oral Oral Oral  Resp: 23 22 18 17   Height:      Weight:    94.9 kg (209 lb 3.5 oz)  SpO2: 94% 95% 95% 93%    Wt Readings from Last 3 Encounters:  05/09/11 94.9 kg (209 lb 3.5 oz)     Intake/Output Summary (Last 24 hours) at 05/09/11 0734 Last data filed at 05/09/11 0400  Gross per 24 hour  Intake   3165 ml  Output    850 ml  Net   2315 ml    Exam Awake Alert, Oriented *3, No new F.N deficits, Normal affect [place].AT,PERRAL Supple Neck,No JVD, No cervical lymphadenopathy appriciated.  Symmetrical Chest wall movement, Good air movement bilaterally, CTAB RRR,No Gallops,Rubs or new Murmurs, No Parasternal Heave +ve B.Sounds, Abd Soft but some generalized tenderness, No organomegaly appriciated, No rebound -guarding or rigidity. No Cyanosis, Clubbing or edema, No new Rash or bruise     Data Review  CBC  Lab 05/09/11 0314 05/08/11 0908 05/07/11 2045  WBC 26.6* 37.0* 18.2*  HGB 7.8* 9.1* 11.3*  HCT 22.2* 26.5* 33.7*  PLT 417* 478* 589*  MCV 98.2 99.3 99.4  MCH 34.5* 34.1* 33.3  MCHC 35.1 34.3 33.5  RDW 17.0* 16.7* 16.5*  LYMPHSABS -- -- 1.5  MONOABS -- -- 2.5*  EOSABS -- -- 0.0  BASOSABS -- -- 0.0  BANDABS -- -- --    Chemistries   Lab 05/09/11 0314 05/08/11 0908 05/07/11 2045  NA 125* 130* 133*  K 3.5 4.0  4.8  CL 94* 99 99  CO2 16* 18* 21  GLUCOSE 116* 140* 169*  BUN 34* 25* 22  CREATININE 1.33* 1.46* 1.03  CALCIUM 7.2* 7.7* 9.8  MG 1.6 1.0* --  AST 19 28 44*  ALT 22 29 43*  ALKPHOS 151* 203* 332*  BILITOT 0.4 0.5 0.8   ------------------------------------------------------------------------------------------------------------------ estimated creatinine clearance is 51.5 ml/min (by C-G formula based on Cr of 1.33). ------------------------------------------------------------------------------------------------------------------ No results found for this basename: HGBA1C:2 in the last 72 hours ------------------------------------------------------------------------------------------------------------------ No results found for this basename: CHOL:2,HDL:2,LDLCALC:2,TRIG:2,CHOLHDL:2,LDLDIRECT:2 in the last 72 hours ------------------------------------------------------------------------------------------------------------------ No results found for this basename: TSH,T4TOTAL,FREET3,T3FREE,THYROIDAB in the last 72 hours ------------------------------------------------------------------------------------------------------------------ No results found for this basename: VITAMINB12:2,FOLATE:2,FERRITIN:2,TIBC:2,IRON:2,RETICCTPCT:2 in the last 72 hours  Coagulation profile No results found for this basename: INR:5,PROTIME:5 in the last 168 hours  No results found for this basename: DDIMER:2 in the last 72 hours  Cardiac Enzymes No results found for this basename: CK:3,CKMB:3,TROPONINI:3,MYOGLOBIN:3 in the last 168 hours ------------------------------------------------------------------------------------------------------------------ No components found with this basename: POCBNP:3  Micro Results Recent Results (from the past 240 hour(s))  CLOSTRIDIUM DIFFICILE BY PCR     Status: Normal   Collection Time   05/07/11 10:22 PM  Component Value Range Status Comment   C difficile by pcr  NEGATIVE  NEGATIVE  Final   MRSA PCR SCREENING     Status: Abnormal   Collection Time   05/08/11  5:04 AM      Component Value Range Status Comment   MRSA by PCR POSITIVE (*) NEGATIVE  Final     Radiology Reports No results found.  Scheduled Meds:    . antiseptic oral rinse  15 mL Mouth Rinse q12n4p  . buPROPion  300 mg Oral Daily  . chlorhexidine  15 mL Mouth Rinse BID  . ciprofloxacin  400 mg Intravenous Q12H  . cyclobenzaprine  5 mg Oral TID  . famotidine  20 mg Oral BID  . gi cocktail  30 mL Oral Once  . metronidazole  500 mg Intravenous Q8H  . promethazine  12.5 mg Intravenous Once  . sodium chloride 0.9 % 50 mL with magnesium sulfate 2 g infusion   Intravenous Once  . DISCONTD: sodium chloride   Intravenous STAT  . DISCONTD: enoxaparin  40 mg Subcutaneous Q24H  . DISCONTD: famotidine  20 mg Oral BID  . DISCONTD: magnesium sulfate 1 - 4 g bolus IVPB  2 g Intravenous Once  . DISCONTD: magnesium sulfate 1 - 4 g bolus IVPB  2 g Intravenous Once   Continuous Infusions:    . sodium chloride    . DISCONTD: sodium chloride    . DISCONTD: sodium chloride    . DISCONTD: sodium chloride 150 mL/hr at 05/09/11 0500   PRN Meds:.morphine injection, ondansetron (ZOFRAN) IV, promethazine, promethazine, zolpidem, DISCONTD: loperamide, DISCONTD: promethazine, DISCONTD: promethazine  Assessment & Plan   1. Sudden onset of Diarrhea causing dehydration and Hypotension - in a pt with recent ABX exposure and ? outpt diagnosis of Norovirus - likely severe  bacterial gastroenteritis, continue cipro-flagyl, IVF, clears, GI following, C Diff -ve, some improvement.   2. Hypertension - BP low due to #1, hold BP meds, IVF.   3. AOCD - post >3 lits IVF so dilutional too, monitor H&H.   4. Pure hypercholesterolemia - home meds when able to tolerate PO.   5.Chronic back pain and spasms - pain control, on flexeril will continue.   6.H/O Recent PNA - stable CXR    7. Elevated  Transaminases - no RUQ  Tenderness - stable trending to NML.   8. Acute renal insufficiency - from azotemia due to #1, IVF, avoid nephrotoxins.   9. Low Mag - repalced IV, improved will give 1gm more, recheck in am.   10. Hyponatremia -  Ur Sodium <10 showing pre renal azotemia despite improved creatinine and IVF ( NS about 6-7 lits so far). IVF.   11. Pt went into S.Tachy VS Afib- 150s at 2.45 pm - I think this is related to dehydration and Abd pain-Nausea related, IVF 2lit NS bolus, EKG S Tach no acute changes, doubt this is Afib(ekg auto read), will check CXR, KUB, pain and nausea control, PRN Lopressor IV if needed, Echo and TSH ordered. Pt requests her Cardiologist(Dr Brackbill)  be called, will do.    DVT Prophylaxis  SCD   See all Orders from today for further details     Leroy Sea M.D on 05/09/2011 at 7:34 AM  [facility] Office  [phone]

## 2011-05-09 NOTE — Progress Notes (Signed)
  Echocardiogram 2D Echocardiogram has been performed.  Jorje Guild [facility] 05/09/2011, 4:20 PM

## 2011-05-09 NOTE — Progress Notes (Signed)
[facility] Progress Note  Subjective: The patient states that her abdominal cramping is better today, and her diarrhea has slowed down somewhat.  Objective: Vital signs in last 24 hours: Temp:  [97.7 F (36.5 C)-99.1 F (37.3 C)] 98.3 F (36.8 C) (01/21 0800) Pulse Rate:  [53-109] 104  (01/21 0400) Resp:  [17-23] 17  (01/21 0800) BP: (93-123)/(55-77) 106/60 mmHg (01/21 0800) SpO2:  [93 %-95 %] 93 % (01/21 0800) Weight:  [94.9 kg (209 lb 3.5 oz)] 94.9 kg (209 lb 3.5 oz) (01/21 0400) Weight change: 4.6 kg (10 lb 2.3 oz)   PE: She is in no acute distress  Heart regular rhythm  Abdomen reveals bowel sounds to be present, it is soft, there is some slight discomfort to palpation diffusely.  Review of CT scan of the abdomen and pelvis did not show any acute serious findings.  Lab Results: Results for orders placed during the hospital encounter of 05/07/11 (from the past 24 hour(s))  LACTIC ACID, PLASMA     Status: Normal   Collection Time   05/08/11  2:20 PM      Component Value Range   Lactic Acid, Venous 1.7  0.5 - 2.2 (mmol/L)  PROCALCITONIN     Status: Normal   Collection Time   05/08/11  2:20 PM      Component Value Range   Procalcitonin 2.65    CBC     Status: Abnormal   Collection Time   05/09/11  3:14 AM      Component Value Range   WBC 26.6 (*) 4.0 - 10.5 (K/uL)   RBC 2.26 (*) 3.87 - 5.11 (MIL/uL)   Hemoglobin 7.8 (*) 12.0 - 15.0 (g/dL)   HCT 16.1 (*) 09.6 - 46.0 (%)   MCV 98.2  78.0 - 100.0 (fL)   MCH 34.5 (*) 26.0 - 34.0 (pg)   MCHC 35.1  30.0 - 36.0 (g/dL)   RDW 04.5 (*) 40.9 - 15.5 (%)   Platelets 417 (*) 150 - 400 (K/uL)  COMPREHENSIVE METABOLIC PANEL     Status: Abnormal   Collection Time   05/09/11  3:14 AM      Component Value Range   Sodium 125 (*) 135 - 145 (mEq/L)   Potassium 3.5  3.5 - 5.1 (mEq/L)   Chloride 94 (*) 96 - 112 (mEq/L)   CO2 16 (*) 19 - 32 (mEq/L)   Glucose, Bld 116 (*) 70 - 99 (mg/dL)   BUN 34 (*) 6 - 23 (mg/dL)   Creatinine, Ser 8.11 (*) 0.50 - 1.10 (mg/dL)   Calcium 7.2 (*) 8.4 - 10.5 (mg/dL)   Total Protein 5.6 (*) 6.0 - 8.3 (g/dL)   Albumin 2.6 (*) 3.5 - 5.2 (g/dL)   AST 19  0 - 37 (U/L)   ALT 22  0 - 35 (U/L)   Alkaline Phosphatase 151 (*) 39 - 117 (U/L)   Total Bilirubin 0.4  0.3 - 1.2 (mg/dL)   GFR calc non Af Amer 42 (*) >90 (mL/min)   GFR calc Af Amer 49 (*) >90 (mL/min)  MAGNESIUM     Status: Normal   Collection Time   05/09/11  3:14 AM      Component Value Range   Magnesium 1.6  1.5 - 2.5 (mg/dL)    Studies/Results: @RISRSLT24 @    Assessment: Abdominal pain and diarrhea with elevated white count of uncertain etiology.  Plan: Continue current management and follow clinically.    [name] F 05/09/2011, 9:44 AM

## 2011-05-09 NOTE — Progress Notes (Signed)
UR completed 

## 2011-05-09 NOTE — Consult Note (Signed)
CARDIOLOGY CONSULT NOTE    Patient ID: Ronnald Collum Flynn MRN: [id number] DOB/AGE: 62/19/1952 62 y.o.  Admit date: 05/07/2011 Referring Physician Susa Raring MD Primary Physician N/A Primary Cardiologist Cassell Clement MD Reason for Consultation Tachycardia  HPI: Mrs. Leclere is a pleasant 61 year old white female admitted on 05/07/2011 for evaluation of acute explosive diarrhea associated with dehydration. She had recently been treated for pneumonia and norovirus. She received Rocephin intramuscular for this. 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 The day of admission she developed acute onset of crampy abdominal pain and explosive diarrhea. She became dehydrated and presented for further treatment. Today the patient developed sustained tachycardia with heart rates as high as 160 beats per minute. Review of telemetry reveals that this is atrial fibrillation. She has received IV Lopressor with some improvement in her heart rate response but the atrial fibrillation persists. She is followed by Dr. Patty Sermons for history of hypertension. She has no other known cardiac disease. She has no prior history of arrhythmia, ischemic heart disease, or congestive heart failure. She is unaware of her rapid heartbeat now.  Review of systems is positive for history of anemia. She denies any significant shortness of breath. He's had no cough. She denies any chest pain. She has no history of dizziness or syncope. Her prior antihypertensive therapy included Lotensin and HCTZ. All other systems are reviewed and are negative.  Past Medical History  Diagnosis Date  . Hypertension     She has a past hx of essential  . 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 Inflammation of joint of knee     Since we last last saw her she developed problems with an acute which required surgical drainage by her  orthopedist Dr. Cleophas Dunker.  Marland Kitchen MRSA (methicillin resistant Staphylococcus aureus)     Knee surgery drainage was positive for MRSA and she was treated with 3 weeks of doxycycline successfully.  . Anemia     Past Hx  . Diarrhea     Mild  . Exogenous obesity     History reviewed. No pertinent family history.  History   Social History  . Marital Status: Divorced    Spouse Name: N/A    Number of Children: N/A  . Years of Education: N/A   Occupational History  . Not on file.   Social History Main Topics  . Smoking status: Current Some Day Smoker  . Smokeless tobacco: Not on file  . Alcohol Use: No  . Drug Use: No  . Sexually Active:    Other Topics Concern  . Not on file   Social History Narrative  . No narrative on file    Past Surgical History  Procedure Date  . Knee surgery      Prescriptions prior to admission  Medication Sig Dispense Refill  . benazepril (LOTENSIN) 20 MG tablet Take 20 mg by mouth daily.        Marland Kitchen buPROPion (WELLBUTRIN XL) 300 MG 24 hr tablet Take 300 mg by mouth daily.       . Calcium Carbonate-Vitamin D (CALCIUM + D PO) Take 500 mg by mouth daily.        . celecoxib (CELEBREX) 200 MG capsule Take 200 mg by mouth daily.        . cyclobenzaprine (FLEXERIL) 10 MG tablet Take 10 mg by mouth as  needed. Muscle spasms      . estradiol (ESTRACE) 2 MG tablet Take 2 mg by mouth daily.        . hydrochlorothiazide 25 MG tablet Daily or as directed  30 tablet  11  . Multiple Vitamin (MULTIVITAMIN PO) Take by mouth.        . naproxen sodium (ANAPROX) 220 MG tablet Take 220 mg by mouth 2 (two) times daily with a meal.      . promethazine (PHENERGAN) 25 MG tablet Take 25 mg by mouth every 6 (six) hours as needed. For nausea      . montelukast (SINGULAIR) 10 MG tablet Take 10 mg by mouth as needed. For breathing nasal drainage congestion relief        Physical Exam: Blood pressure 97/53, pulse 45, temperature 99 F (37.2 C), temperature source Oral, resp. rate  26, height 5\' 6"  (1.676 m), weight 94.9 kg (209 lb 3.5 oz), SpO2 100.00%. she is a pleasant overweight white female who appears ill. She is normocephalic, atraumatic. Pupils are equal round and reactive to light accommodation. Sclera are clear. Oropharynx reveals very dry mucous membranes and her tongue appears swollen. Neck is supple without adenopathy, thyromegaly, JVD, or bruits. Lungs are clear. Cardiac exam reveals an irregular rate and rhythm without gallop, murmur, or click. Abdomen is soft with only slight tenderness to deep palpation. Bowel sounds are positive. There is no hepatosplenomegaly. Femoral and pedal pulses are palpable. She has no phlebitis or edema. There is no cyanosis. Her skin is warm and dry. She is alert and oriented x3. Cranial nerves II through XII are intact.  Labs:   Lab Results  Component Value Date   WBC 26.6* 05/09/2011   HGB 7.8* 05/09/2011   HCT 22.2* 05/09/2011   MCV 98.2 05/09/2011   PLT 417* 05/09/2011    Lab 05/09/11 0314  NA 125*  K 3.5  CL 94*  CO2 16*  BUN 34*  CREATININE 1.33*  CALCIUM 7.2*  PROT 5.6*  BILITOT 0.4  ALKPHOS 151*  ALT 22  AST 19  GLUCOSE 116*   No results found for this basename: CKTOTAL, CKMB, CKMBINDEX, TROPONINI    Lab Results  Component Value Date   CHOL 216* 12/15/2010   CHOL  Value: 195        ATP III CLASSIFICATION:  <200     mg/dL   Desirable  409-811  mg/dL   Borderline High  >=914    mg/dL   High 10/24/2954   Lab Results  Component Value Date   HDL 92.50 12/15/2010   HDL 62 07/20/2007   Lab Results  Component Value Date   LDLCALC  Value: 115        Total Cholesterol/HDL:CHD Risk Coronary Heart Disease Risk Table                     Men   Women  1/2 Average Risk   3.4   3.3* 07/20/2007   Lab Results  Component Value Date   TRIG 68.0 12/15/2010   TRIG 90 07/20/2007   Lab Results  Component Value Date   CHOLHDL 2 12/15/2010   CHOLHDL 3.1 07/20/2007   Lab Results  Component Value Date   LDLDIRECT 105.6 12/15/2010       Radiology: Chest x-ray shows normal cardiac size with some volume loss in the bases. No acute infiltrate. EKG: Admission ECG on January 19 shows normal sinus rhythm with no acute changes. Repeat ECG today  demonstrates atrial fibrillation with a rapid ventricular response.  ASSESSMENT AND PLAN:  1. Atrial fibrillation with a rapid ventricular response. This is clearly related to multiple stressors including dehydration, diarrhea, anemia, and recent pneumonia.  2. Hypertension, patient is now hypotensive. Blood pressure medicines have been held.  3. Hypercholesterolemia.  4. Anemia.  5. Acute diarrhea with elevated white blood count  6. Dehydration.  7. Recent pneumonia/viral URI  8. Hyponatremia.  9. Hypomagnesemia.  Plan: We will obtain an echocardiogram today and thyroid function studies. I would recommend continuous IV infusion of diltiazem for rate control since this could be adjusted quickly. Patient has not currently taking by mouth as well. Her [place] score is 1. If her atrial fibrillation persists until tomorrow we may need to consider anticoagulation. I think it is likely that treatment of her underlying dehydration and diarrhea will correct her arrhythmia spontaneously. We will cycle cardiac enzymes. I would hold her HCTZ and ACE inhibitor for now.  SignedTheron Arista [facility] 05/09/2011, 4:51 PM

## 2011-05-10 ENCOUNTER — Inpatient Hospital Stay (HOSPITAL_COMMUNITY): Payer: BC Managed Care – PPO

## 2011-05-10 LAB — COMPREHENSIVE METABOLIC PANEL
ALT: 18 U/L (ref 0–35)
Alkaline Phosphatase: 129 U/L — ABNORMAL HIGH (ref 39–117)
BUN: 22 mg/dL (ref 6–23)
Chloride: 101 mEq/L (ref 96–112)
GFR calc Af Amer: 71 mL/min — ABNORMAL LOW (ref >90)
Glucose, Bld: 123 mg/dL — ABNORMAL HIGH (ref 70–99)
Potassium: 3.6 mEq/L (ref 3.5–5.1)
Sodium: 128 mEq/L — ABNORMAL LOW (ref 135–145)
Total Bilirubin: 0.3 mg/dL (ref 0.3–1.2)
Total Protein: 5.4 g/dL — ABNORMAL LOW (ref 6.0–8.3)

## 2011-05-10 LAB — BASIC METABOLIC PANEL
Chloride: 100 mEq/L (ref 96–112)
GFR calc Af Amer: >90 mL/min (ref >90)
GFR calc non Af Amer: 78 mL/min — ABNORMAL LOW (ref >90)
Potassium: 3.3 mEq/L — ABNORMAL LOW (ref 3.5–5.1)

## 2011-05-10 LAB — CBC
HCT: 19.6 % — ABNORMAL LOW (ref 36.0–46.0)
Hemoglobin: 6.6 g/dL — CL (ref 12.0–15.0)
MCHC: 33.7 g/dL (ref 30.0–36.0)
RBC: 1.99 MIL/uL — ABNORMAL LOW (ref 3.87–5.11)
WBC: 16.2 10*3/uL — ABNORMAL HIGH (ref 4.0–10.5)

## 2011-05-10 LAB — MAGNESIUM: Magnesium: 1.7 mg/dL (ref 1.5–2.5)

## 2011-05-10 LAB — CARDIAC PANEL(CRET KIN+CKTOT+MB+TROPI)
CK, MB: 4.6 ng/mL — ABNORMAL HIGH (ref 0.3–4.0)
CK, MB: 4.2 ng/mL — ABNORMAL HIGH (ref 0.3–4.0)
Relative Index: INVALID (ref 0.0–2.5)
Troponin I: <0.3 ng/mL (ref <0.30)

## 2011-05-10 LAB — IRON AND TIBC
Iron: 157 ug/dL — ABNORMAL HIGH (ref 42–135)
Saturation Ratios: 51 % (ref 20–55)
TIBC: 305 ug/dL (ref 250–470)
UIBC: 148 ug/dL (ref 125–400)

## 2011-05-10 LAB — PREPARE RBC (CROSSMATCH)

## 2011-05-10 LAB — PROTIME-INR: INR: 1.16 (ref 0.00–1.49)

## 2011-05-10 LAB — RETICULOCYTES: RBC.: 2.41 MIL/uL — ABNORMAL LOW (ref 3.87–5.11)

## 2011-05-10 LAB — FOLATE: Folate: 10.8 ng/mL

## 2011-05-10 MED ORDER — LANSOPRAZOLE 30 MG PO CPDR
30.0000 mg | DELAYED_RELEASE_CAPSULE | Freq: Every day | ORAL | Status: DC
  Administered 2011-05-10 – 2011-05-20 (×11): 30 mg via ORAL
  Filled 2011-05-10 (×11): qty 1

## 2011-05-10 MED ORDER — METOPROLOL TARTRATE 25 MG PO TABS
25.0000 mg | ORAL_TABLET | Freq: Three times a day (TID) | ORAL | Status: DC
  Administered 2011-05-10 – 2011-05-11 (×3): 25 mg via ORAL
  Filled 2011-05-10 (×6): qty 1

## 2011-05-10 MED ORDER — NICOTINE 21 MG/24HR TD PT24
21.0000 mg | MEDICATED_PATCH | Freq: Every day | TRANSDERMAL | Status: DC
  Administered 2011-05-11: 21 mg via TRANSDERMAL
  Filled 2011-05-10 (×2): qty 1

## 2011-05-10 MED ORDER — MAGNESIUM SULFATE IN D5W 10-5 MG/ML-% IV SOLN
1.0000 g | Freq: Once | INTRAVENOUS | Status: AC
  Administered 2011-05-10: 1 g via INTRAVENOUS
  Filled 2011-05-10: qty 100

## 2011-05-10 MED ORDER — FUROSEMIDE 10 MG/ML IJ SOLN
20.0000 mg | Freq: Once | INTRAMUSCULAR | Status: DC
  Filled 2011-05-10: qty 2

## 2011-05-10 MED ORDER — VITAMINS A & D EX OINT
TOPICAL_OINTMENT | CUTANEOUS | Status: AC
  Administered 2011-05-10: 03:00:00
  Filled 2011-05-10: qty 5

## 2011-05-10 MED ORDER — VITAMINS A & D EX OINT
TOPICAL_OINTMENT | CUTANEOUS | Status: AC
  Filled 2011-05-10: qty 5

## 2011-05-10 MED ORDER — MAGNESIUM SULFATE 50 % IJ SOLN: 1.0000 g | Freq: Once | INTRAMUSCULAR | Status: DC

## 2011-05-10 NOTE — Progress Notes (Signed)
Hgb dropped from 11.3 on 05/07/2011 to 6.6 this am. Type and screen done, plan for transfusion of 2 units PRBC this am. 2mg  morphine iv given x 2 for c/o crampy abdominal pain this shift. No bowel movements noted, up to bedside commode voiding amber urine.

## 2011-05-10 NOTE — Progress Notes (Signed)
TELEMETRY: Reviewed telemetry pt in NSR rate 100: Filed Vitals:   05/10/11 0650 05/10/11 0700 05/10/11 0710 05/10/11 0800  BP: 134/73 132/57 127/63   Pulse:      Temp: 98.7 F (37.1 C) 98.8 F (37.1 C)  98.6 F (37 C)  TempSrc: Oral Oral  Oral  Resp: 24 20 19    Height:      Weight:      SpO2: 96% 96% 95%     Intake/Output Summary (Last 24 hours) at 05/10/11 0830 Last data filed at 05/10/11 0700  Gross per 24 hour  Intake   5695 ml  Output   3175 ml  Net   2520 ml    SUBJECTIVE No diarrhea, abdominal cramps less. Denies SOB or chest pain.   LABS: Basic Metabolic Panel:  Basename 05/10/11 0120 05/09/11 0314  NA 128* 125*  K 3.6 3.5  CL 101 94*  CO2 16* 16*  GLUCOSE 123* 116*  BUN 22 34*  CREATININE 0.98 1.33*  CALCIUM 7.1* 7.2*  MG 1.6 1.6  PHOS -- --   Liver Function Tests:  Basename 05/10/11 0120 05/09/11 0314  AST 17 19  ALT 18 22  ALKPHOS 129* 151*  BILITOT 0.3 0.4  PROT 5.4* 5.6*  ALBUMIN 2.5* 2.6*    Basename 05/07/11 2045  LIPASE 39  AMYLASE --   CBC:  Basename 05/10/11 0120 05/09/11 0314 05/07/11 2045  WBC 16.2* 26.6* --  NEUTROABS -- -- 14.2*  HGB 6.6* 7.8* --  HCT 19.6* 22.2* --  MCV 98.5 98.2 --  PLT 347 417* --   Cardiac Enzymes:  Basename 05/10/11 0120 05/09/11 1706  CKTOTAL 52 58  CKMB 4.6* 4.5*  CKMBINDEX -- --  TROPONINI <0.30 <0.30   BNP: No components found with this basename: POCBNP:3 D-Dimer: No results found for this basename: DDIMER:2 in the last 72 hours Hemoglobin A1C: No results found for this basename: HGBA1C in the last 72 hours Fasting Lipid Panel: No results found for this basename: CHOL,HDL,LDLCALC,TRIG,CHOLHDL,LDLDIRECT in the last 72 hours Thyroid Function Tests:  Basename 05/09/11 0947  TSH 2.474  T4TOTAL --  T3FREE --  THYROIDAB --   Anemia Panel: No results found for this basename: VITAMINB12,FOLATE,FERRITIN,TIBC,IRON,RETICCTPCT in the last 72 hours  Radiology/Studies:  Ct Abdomen  Pelvis Wo Contrast  05/08/2011  *RADIOLOGY REPORT*  Clinical Data: Abdominal pain, diarrhea  CT ABDOMEN AND PELVIS WITHOUT CONTRAST  Technique:  Multidetector CT imaging of the abdomen and pelvis was performed following the standard protocol without intravenous contrast.  Comparison: 06/23/2008  Findings: Patchy opacities in the bilateral lung bases, likely atelectasis.  Gastric distension. Fluid within the distal esophagus, suggesting gastroesophageal reflux or esophageal dysmotility.  Unenhanced liver, spleen, pancreas, and adrenal glands are within normal limits.  Gallbladder is unremarkable.  No intrahepatic or extrahepatic ductal dilatation.  Kidneys are within normal limits.  No renal calculi or hydronephrosis.  No evidence of small bowel obstruction.  A few dilated loops of small and large bowel in the right upper abdomen, possibly reflecting secondary adynamic ileus.  Colonic wall thickening with surrounding inflammatory changes involving the distal transverse colon/proximal descending colon (series 2/image 45), suggesting colitis.  Suture line in the lower abdomen (series 2/image 80).  Atherosclerotic calcifications of the abdominal aorta and branch vessels.  No abdominopelvic ascites.  No suspicious abdominopelvic lymphadenopathy.  Status post hysterectomy.  No adnexal masses.  Bladder is within normal limits.  Extensive degenerative changes of the visualized thoracolumbar spine.  Grade 1 anterolisthesis of L4  on L5, unchanged.  IMPRESSION: Colonic wall thickening with surrounding inflammatory changes involving the distal transverse/proximal descending colon, suggesting infectious/inflammatory colitis.  No evidence of small bowel obstruction.  Possible secondary adynamic ileus.  Gastric distension with possible gastroesophageal reflux versus esophageal dysmotility.  Original Report Authenticated By: Charline Bills, M.D.   Dg Chest Port 1 View  05/09/2011  *RADIOLOGY REPORT*  Clinical Data: Nausea   PORTABLE CHEST - 1 VIEW  Comparison: 05/08/2011  Findings:  Unchanged cardiac silhouette and mediastinal contours.  Minimal increase in linear heterogeneous opacities within the right lower lung.  Minimal increase in left basilar heterogeneous opacities and blunting of the left costophrenic angle.  No definite right-sided pleural effusion.  No pneumothorax.  Grossly unchanged bones.  IMPRESSION:  Minimal increase in bibasilar heterogeneous opacities favored to represent atelectasis.  Further evaluation with PA and lateral chest radiograph may be obtained as clinically indicated.  Original Report Authenticated By: Waynard Reeds, M.D.   Dg Chest Port 1 View  05/08/2011  *RADIOLOGY REPORT*  Clinical Data: Fatigue, cough  PORTABLE CHEST - 1 VIEW  Comparison: 07/19/2007  Findings: Low lung volumes with vascular crowding.  No frank interstitial edema. No pleural effusion or pneumothorax.  Cardiomediastinal silhouette is within normal limits.  IMPRESSION: Low lung volumes with vascular crowding.  Original Report Authenticated By: Charline Bills, M.D.   Dg Abd Portable 1v  05/09/2011  *RADIOLOGY REPORT*  Clinical Data: Shortness of breath and nausea  PORTABLE ABDOMEN - 1 VIEW  Comparison: 05/08/2011;Abdominal CT - 05/08/2011  Findings:  There is grossly unchanged mild to moderate gaseous distension of multiple loops of large and small bowel. Evaluation for pneumoperitoneum is limited secondary to supine patient positioning.  No definite pneumatosis or portal venous gas. Multilevel lumbar spine degenerative change.  IMPRESSION: Grossly unchanged findings suggestive of ileus.  Original Report Authenticated By: Waynard Reeds, M.D.   Dg Abd Portable 1v  05/08/2011  *RADIOLOGY REPORT*  Clinical Data: Abdominal pain  PORTABLE ABDOMEN - 1 VIEW  Comparison: [facility] CT abdomen pelvis dated 06/23/2008  Findings: Mildly dilated loops of small bowel in the lower abdomen. Mildly dilated loops of  ascending/transverse colon.  This appearance favors adynamic ileus over small bowel obstruction.  Surgical sutures in the pelvis.  Degenerative changes of the visualized thoracolumbar spine. Degenerative changes of the left hip.  IMPRESSION: Mildly dilated loops of small bowel and colon, favoring adynamic ileus over small bowel obstruction.  Original Report Authenticated By: Charline Bills, M.D.    PHYSICAL EXAM General: Well developed, well nourished, in no acute distress. Head: Normocephalic, atraumatic, sclera non-icteric, no xanthomas, nares are without discharge. Neck: Negative for carotid bruits. JVD not elevated. Dry mucus membranes. Tongue thick. Lungs: Clear bilaterally to auscultation without wheezes, rales, or rhonchi. Breathing is unlabored. Heart: RRR S1 S2 without murmurs, rubs, or gallops.  Abdomen: Soft, non-tender, non-distended with normoactive bowel sounds. No hepatomegaly. No rebound/guarding. No obvious abdominal masses. Msk:  Strength and tone appears normal for age. Extremities: No clubbing, cyanosis or edema.  Distal pedal pulses are 2+ and equal bilaterally. Neuro: Alert and oriented X 3. Moves all extremities spontaneously. Psych:  Responds to questions appropriately with a normal affect.  ASSESSMENT AND PLAN:  1. Atrial fibrillation now converted to NSR. Due to multiple cardiac stressors. Echo shows normal LV function, mild LVH. MAC. TFTs are normal.  2. Severe anemia, being transfused.  3.HTN  4. Acute diarrhea with elevated WBC  5. Dehydration.  6. Recent PNA  Plan: will discontinue IV cardizem. Correct anemia and dehydration. Not a candidate for anticoagulation due to drop in Hgb. Anticoagulation not really indicated at this point.  Principal Problem:  *Diarrhea Active Problems:  Benign hypertensive heart disease without heart failure  Pure hypercholesterolemia  Anemia  Dehydration  Chronic back pain  Atrial fibrillation with rapid ventricular  response    Signed, Latisha Lasch [place] MD, [facility] 05/10/11

## 2011-05-10 NOTE — Progress Notes (Signed)
[facility] Progress Note  Subjective: The patient's abdominal pain and cramping have improved and she has not had any diarrhea since eating admitted to the ICU. In general she is feeling better. Her hemoglobin and hematocrit have dropped. She said that when she was in the emergency room she did pass some blood in the stool. She has not had any bowel movements however since being up in the ICU. She is receiving a blood transfusion at this time. Her white count has improved.  Objective: Vital signs in last 24 hours: Temp:  [98 F (36.7 C)-99 F (37.2 C)] 98.6 F (37 C) (01/22 0800) Pulse Rate:  [29-154] 93  (01/22 0500) Resp:  [15-26] 19  (01/22 0710) BP: (93-134)/(52-80) 127/63 mmHg (01/22 0710) SpO2:  [81 %-100 %] 95 % (01/22 0710) Weight change:    PE: She is alert and oriented  Heart regular rhythm  Abdomen: Bowel sounds are present it is soft, there is some diffuse mild tenderness but the patient reports it is much better than it has been.  Lab Results: Results for orders placed during the hospital encounter of 05/07/11 (from the past 24 hour(s))  CARDIAC PANEL(CRET KIN+CKTOT+MB+TROPI)     Status: Abnormal   Collection Time   05/09/11  5:06 PM      Component Value Range   Total CK 58  7 - 177 (U/L)   CK, MB 4.5 (*) 0.3 - 4.0 (ng/mL)   Troponin I <0.30  <0.30 (ng/mL)   Relative Index RELATIVE INDEX IS INVALID  0.0 - 2.5   CBC     Status: Abnormal   Collection Time   05/10/11  1:20 AM      Component Value Range   WBC 16.2 (*) 4.0 - 10.5 (K/uL)   RBC 1.99 (*) 3.87 - 5.11 (MIL/uL)   Hemoglobin 6.6 (*) 12.0 - 15.0 (g/dL)   HCT 16.1 (*) 09.6 - 46.0 (%)   MCV 98.5  78.0 - 100.0 (fL)   MCH 33.2  26.0 - 34.0 (pg)   MCHC 33.7  30.0 - 36.0 (g/dL)   RDW 04.5 (*) 40.9 - 15.5 (%)   Platelets 347  150 - 400 (K/uL)  COMPREHENSIVE METABOLIC PANEL     Status: Abnormal   Collection Time   05/10/11  1:20 AM      Component Value Range   Sodium 128 (*) 135 - 145 (mEq/L)   Potassium 3.6  3.5 - 5.1 (mEq/L)   Chloride 101  96 - 112 (mEq/L)   CO2 16 (*) 19 - 32 (mEq/L)   Glucose, Bld 123 (*) 70 - 99 (mg/dL)   BUN 22  6 - 23 (mg/dL)   Creatinine, Ser 8.11  0.50 - 1.10 (mg/dL)   Calcium 7.1 (*) 8.4 - 10.5 (mg/dL)   Total Protein 5.4 (*) 6.0 - 8.3 (g/dL)   Albumin 2.5 (*) 3.5 - 5.2 (g/dL)   AST 17  0 - 37 (U/L)   ALT 18  0 - 35 (U/L)   Alkaline Phosphatase 129 (*) 39 - 117 (U/L)   Total Bilirubin 0.3  0.3 - 1.2 (mg/dL)   GFR calc non Af Amer 61 (*) >90 (mL/min)   GFR calc Af Amer 71 (*) >90 (mL/min)  MAGNESIUM     Status: Normal   Collection Time   05/10/11  1:20 AM      Component Value Range   Magnesium 1.6  1.5 - 2.5 (mg/dL)  CARDIAC PANEL(CRET KIN+CKTOT+MB+TROPI)     Status: Abnormal  Collection Time   05/10/11  1:20 AM      Component Value Range   Total CK 52  7 - 177 (U/L)   CK, MB 4.6 (*) 0.3 - 4.0 (ng/mL)   Troponin I <0.30  <0.30 (ng/mL)   Relative Index RELATIVE INDEX IS INVALID  0.0 - 2.5   PROTIME-INR     Status: Normal   Collection Time   05/10/11  3:30 AM      Component Value Range   Prothrombin Time 15.0  11.6 - 15.2 (seconds)   INR 1.16  0.00 - 1.49   ABO/RH     Status: Normal   Collection Time   05/10/11  3:30 AM      Component Value Range   ABO/RH(D) A POS    PREPARE RBC (CROSSMATCH)     Status: Normal   Collection Time   05/10/11  4:00 AM      Component Value Range   Order Confirmation ORDER PROCESSED BY BLOOD BANK    TYPE AND SCREEN     Status: Normal (Preliminary result)   Collection Time   05/10/11  6:00 AM      Component Value Range   ABO/RH(D) A POS     Antibody Screen NEG     Sample Expiration 05/13/2011     Unit Number [id number]     Blood Component Type RED CELLS,LR     Unit division 00     Status of Unit ISSUED     Transfusion Status OK TO TRANSFUSE     Crossmatch Result Compatible     Unit Number [id number]     Blood Component Type RED CELLS,LR     Unit division 00     Status of Unit ALLOCATED     Transfusion  Status OK TO TRANSFUSE     Crossmatch Result Compatible      Studies/Results: @RISRSLT24 @    Assessment: There appears to be clinical improvement with this enteritis picture. I suspect that this is of infectious etiology although it is unclear exactly what the organism is.  Plan: She seems to be responding to medical therapy at this time with antibiotic therapy. I would recommend continuing current therapy. She is being transfused blood.    [name] F 05/10/2011, 9:52 AM

## 2011-05-10 NOTE — Progress Notes (Signed)
eLink Physician-Brief Progress Note Patient Name: [name] DOB: 09/02/1950 MRN: [id number]  Date of Service  05/10/2011   eICU Interventions  PAC and some PVC  Plan Check bmet and mag and phos   Intervention Category Intermediate Interventions: Arrhythmia - evaluation and management  Bernetha Anschutz 05/10/2011, 9:49 PM

## 2011-05-10 NOTE — Progress Notes (Signed)
[name] CSN:[id number],MRN:[id number] is a 61 y.o. female,  Outpatient Primary MD for the patient is No primary provider on file.  Chief Complaint  Patient presents with  . Abdominal Pain  . Hypotension  . Diarrhea        Subjective:   [name] today has, No headache, No chest pain, No new weakness tingling or numbness, No Cough - SOB.   On 05-10-11 improvement in generalized crampy non radiating Abd pain, diarrhea and nausea   Objective:   Filed Vitals:   05/10/11 0650 05/10/11 0700 05/10/11 0710 05/10/11 0800  BP: 134/73 132/57 127/63   Pulse:      Temp: 98.7 F (37.1 C) 98.8 F (37.1 C)  98.6 F (37 C)  TempSrc: Oral Oral  Oral  Resp: 24 20 19    Height:      Weight:      SpO2: 96% 96% 95%     Wt Readings from Last 3 Encounters:  05/09/11 94.9 kg (209 lb 3.5 oz)     Intake/Output Summary (Last 24 hours) at 05/10/11 0915 Last data filed at 05/10/11 0700  Gross per 24 hour  Intake   5695 ml  Output   2775 ml  Net   2920 ml    Exam Awake Alert, Oriented *3, No new F.N deficits, Normal affect [place].AT,PERRAL Supple Neck,No JVD, No cervical lymphadenopathy appriciated.  Symmetrical Chest wall movement, Good air movement bilaterally, CTAB RRR,No Gallops,Rubs or new Murmurs, No Parasternal Heave +ve B.Sounds, Abd Soft but some generalized tenderness, No organomegaly appriciated, No rebound -guarding or rigidity. No Cyanosis, Clubbing or edema, No new Rash or bruise       Data Review  CBC  Lab 05/10/11 0120 05/09/11 0314 05/08/11 0908 05/07/11 2045  WBC 16.2* 26.6* 37.0* 18.2*  HGB 6.6* 7.8* 9.1* 11.3*  HCT 19.6* 22.2* 26.5* 33.7*  PLT 347 417* 478* 589*  MCV 98.5 98.2 99.3 99.4  MCH 33.2 34.5* 34.1* 33.3  MCHC 33.7 35.1 34.3 33.5  RDW 16.7* 17.0* 16.7* 16.5*  LYMPHSABS -- -- -- 1.5  MONOABS -- -- -- 2.5*  EOSABS -- -- -- 0.0  BASOSABS -- -- -- 0.0  BANDABS -- -- -- --    Chemistries   Lab 05/10/11 0120 05/09/11 0314 05/08/11 0908 05/07/11 2045  NA  128* 125* 130* 133*  K 3.6 3.5 4.0 4.8  CL 101 94* 99 99  CO2 16* 16* 18* 21  GLUCOSE 123* 116* 140* 169*  BUN 22 34* 25* 22  CREATININE 0.98 1.33* 1.46* 1.03  CALCIUM 7.1* 7.2* 7.7* 9.8  MG 1.6 1.6 1.0* --  AST 17 19 28  44*  ALT 18 22 29  43*  ALKPHOS 129* 151* 203* 332*  BILITOT 0.3 0.4 0.5 0.8   ------------------------------------------------------------------------------------------------------------------ estimated creatinine clearance is 69.9 ml/min (by C-G formula based on Cr of 0.98). ------------------------------------------------------------------------------------------------------------------ No results found for this basename: HGBA1C:2 in the last 72 hours ------------------------------------------------------------------------------------------------------------------ No results found for this basename: CHOL:2,HDL:2,LDLCALC:2,TRIG:2,CHOLHDL:2,LDLDIRECT:2 in the last 72 hours ------------------------------------------------------------------------------------------------------------------  [facility] 05/09/11 0947  TSH 2.474  T4TOTAL --  T3FREE --  THYROIDAB --   ------------------------------------------------------------------------------------------------------------------ No results found for this basename: VITAMINB12:2,FOLATE:2,FERRITIN:2,TIBC:2,IRON:2,RETICCTPCT:2 in the last 72 hours  Coagulation profile  Lab 05/10/11 0330  INR 1.16  PROTIME --    No results found for this basename: DDIMER:2 in the last 72 hours  Cardiac Enzymes  Lab 05/10/11 0120 05/09/11 1706  CKMB 4.6* 4.5*  TROPONINI <0.30 <0.30  MYOGLOBIN -- --   ------------------------------------------------------------------------------------------------------------------ No  components found with this basename: POCBNP:3  Micro Results Recent Results (from the past 240 hour(s))  STOOL CULTURE     Status: Normal (Preliminary result)   Collection Time   05/07/11 10:22 PM      Component  Value Range Status Comment   Specimen Description STOOL   Final    Special Requests NONE   Final    Culture NO SUSPICIOUS COLONIES, CONTINUING TO HOLD   Final    Report Status PENDING   Incomplete   CLOSTRIDIUM DIFFICILE BY PCR     Status: Normal   Collection Time   05/07/11 10:22 PM      Component Value Range Status Comment   C difficile by pcr NEGATIVE  NEGATIVE  Final   MRSA PCR SCREENING     Status: Abnormal   Collection Time   05/08/11  5:04 AM      Component Value Range Status Comment   MRSA by PCR POSITIVE (*) NEGATIVE  Final     Radiology Reports No results found.  Scheduled Meds:    . antiseptic oral rinse  15 mL Mouth Rinse q12n4p  . buPROPion  300 mg Oral Daily  . chlorhexidine  15 mL Mouth Rinse BID  . Chlorhexidine Gluconate Cloth  6 each Topical Q0600  . ciprofloxacin  400 mg Intravenous Q12H  . cyclobenzaprine  5 mg Oral TID  . famotidine  20 mg Oral BID  . furosemide  20 mg Intravenous Once  . magnesium sulfate 1 - 4 g bolus IVPB  1 g Intravenous Once  . metronidazole  500 mg Intravenous Q8H  . mupirocin ointment  1 application Nasal BID  . sodium chloride  1,000 mL Intravenous Once  . vitamin A & D       Continuous Infusions:    . sodium chloride 150 mL/hr at 05/10/11 0437  . DISCONTD: diltiazem (CARDIZEM) infusion 10 mg/hr (05/10/11 0205)   PRN Meds:.metoprolol, morphine injection, oxyCODONE, promethazine, promethazine, zolpidem  Assessment & Plan   1. Sudden onset of Diarrhea causing dehydration and Hypotension - in a pt with recent ABX exposure and ? outpt diagnosis of Norovirus - likely severe  bacterial gastroenteritis, continue cipro-flagyl, IVF, clears, GI following, C Diff -ve, some improvement.Follow cultures.   2. H/O Hypertension - BP was low due to #1, hold BP meds, better with IVF.   3. AOCD + Dilutional - post >7 lits IVF, pending Occ blood , check Anemia panel, 1 unit transfusion started last night as  H&H fell <7 post IVF 2lit  bolus..   4. Pure hypercholesterolemia - home meds when able to tolerate PO.   5.Chronic back pain and spasms - pain control, on flexeril will continue.   6. H/O Recent PNA - some atelectasis on CXR , add IS, up in chair.   7. Elevated Transaminases - no RUQ  Tenderness - stable trending to NML.   8. Acute renal insufficiency - from azotemia due to #1, IVF, improving, avoid nephrotoxins.   9. Low Mag -   will give 1gm more IV, recheck in am.   10. Hyponatremia -  Ur Sodium <10 showing pre renal azotemia despite improved creatinine and IVF ( NS about 6-7 lits so far). Continue IVF. Trending better.   11. Pt went into   Afib- 150s at 2.45 pm on 05-09-11- now NSR post Cardizem IV per Cards, Echo stable, TSH, stable, likely from Volume loss due to #1, no further Rx per Cards, tele monitor.  DVT Prophylaxis  SCD   See all Orders from today for further details     Leroy Sea M.D on 05/10/2011 at 9:15 AM  [facility] Office  [phone]

## 2011-05-11 DIAGNOSIS — I119 Hypertensive heart disease without heart failure: Secondary | ICD-10-CM

## 2011-05-11 LAB — COMPREHENSIVE METABOLIC PANEL
BUN: 8 mg/dL (ref 6–23)
Calcium: 8 mg/dL — ABNORMAL LOW (ref 8.4–10.5)
Creatinine, Ser: 0.68 mg/dL (ref 0.50–1.10)
GFR calc Af Amer: >90 mL/min (ref >90)
GFR calc non Af Amer: >90 mL/min (ref >90)
Glucose, Bld: 88 mg/dL (ref 70–99)
Total Protein: 5.8 g/dL — ABNORMAL LOW (ref 6.0–8.3)

## 2011-05-11 LAB — MAGNESIUM: Magnesium: 1 mg/dL — ABNORMAL LOW (ref 1.5–2.5)

## 2011-05-11 LAB — CBC
HCT: 23.9 % — ABNORMAL LOW (ref 36.0–46.0)
Hemoglobin: 8.2 g/dL — ABNORMAL LOW (ref 12.0–15.0)
MCH: 32.8 pg (ref 26.0–34.0)
MCHC: 34.3 g/dL (ref 30.0–36.0)
MCV: 95.6 fL (ref 78.0–100.0)

## 2011-05-11 MED ORDER — METOPROLOL TARTRATE 1 MG/ML IV SOLN
5.0000 mg | Freq: Once | INTRAVENOUS | Status: AC
  Administered 2011-05-11: 5 mg via INTRAVENOUS

## 2011-05-11 MED ORDER — DM-GUAIFENESIN ER 30-600 MG PO TB12
1.0000 | ORAL_TABLET | Freq: Two times a day (BID) | ORAL | Status: DC
  Administered 2011-05-11 – 2011-05-20 (×19): 1 via ORAL
  Filled 2011-05-11 (×20): qty 1

## 2011-05-11 MED ORDER — DILTIAZEM HCL ER 180 MG PO CP24
180.0000 mg | ORAL_CAPSULE | Freq: Every day | ORAL | Status: DC
  Administered 2011-05-11 – 2011-05-20 (×10): 180 mg via ORAL
  Filled 2011-05-11 (×10): qty 1

## 2011-05-11 MED ORDER — SODIUM CHLORIDE 0.9 % IV SOLN
INTRAVENOUS | Status: DC
  Administered 2011-05-12 – 2011-05-14 (×3): via INTRAVENOUS
  Administered 2011-05-15: 1000 mL via INTRAVENOUS
  Administered 2011-05-15 – 2011-05-16 (×2): via INTRAVENOUS

## 2011-05-11 MED ORDER — NICOTINE 14 MG/24HR TD PT24
14.0000 mg | MEDICATED_PATCH | Freq: Every day | TRANSDERMAL | Status: DC
  Administered 2011-05-12 – 2011-05-20 (×9): 14 mg via TRANSDERMAL
  Filled 2011-05-11 (×11): qty 1

## 2011-05-11 MED ORDER — METOPROLOL TARTRATE 1 MG/ML IV SOLN
INTRAVENOUS | Status: AC
  Administered 2011-05-11: 5 mg via INTRAVENOUS
  Filled 2011-05-11: qty 5

## 2011-05-11 MED ORDER — POTASSIUM CHLORIDE 10 MEQ/100ML IV SOLN
10.0000 meq | INTRAVENOUS | Status: AC
  Administered 2011-05-11 (×4): 10 meq via INTRAVENOUS
  Filled 2011-05-11 (×2): qty 100

## 2011-05-11 MED ORDER — MAGNESIUM SULFATE 40 MG/ML IJ SOLN
4.0000 g | Freq: Once | INTRAMUSCULAR | Status: AC
  Administered 2011-05-11: 4 g via INTRAVENOUS
  Filled 2011-05-11: qty 100

## 2011-05-11 MED ORDER — POTASSIUM CHLORIDE 10 MEQ/100ML IV SOLN
INTRAVENOUS | Status: AC
  Administered 2011-05-11: 10 meq via INTRAVENOUS
  Filled 2011-05-11: qty 100

## 2011-05-11 MED ORDER — POTASSIUM CHLORIDE 10 MEQ/100ML IV SOLN
INTRAVENOUS | Status: AC
  Administered 2011-05-11: 10 meq via INTRAVENOUS
  Filled 2011-05-11: qty 200

## 2011-05-11 NOTE — Progress Notes (Signed)
[name] CSN:[id number],MRN:[id number] is a 61 y.o. female,  Outpatient Primary MD for the patient is No primary provider on file.  Chief Complaint  Patient presents with  . Abdominal Pain  . Hypotension  . Diarrhea        Subjective:   Pt admits to non-productive cough. Per nursing staff O2 sats dropping to about 81% on room air with minimal activity. Still some abdominal cramping but improved, she denies of any further diarrhea. +nausea, but no vomiting. Chart reviewed.  Objective:   Filed Vitals:   05/11/11 0000 05/11/11 0300 05/11/11 0400 05/11/11 0607  BP: 140/78 142/64  150/80  Pulse:    85  Temp:   98.8 F (37.1 C)   TempSrc:   Oral   Resp: 17 16    Height:      Weight:      SpO2: 93% 96%      Wt Readings from Last 3 Encounters:  05/09/11 94.9 kg (209 lb 3.5 oz)     Intake/Output Summary (Last 24 hours) at 05/11/11 0852 Last data filed at 05/11/11 0600  Gross per 24 hour  Intake   2775 ml  Output   1750 ml  Net   1025 ml    Exam Awake Alert, Oriented *3, No new F.N deficits, Normal affect HEENT: [place].AT,PERRAL Neck: Supple Neck,No JVD, No cervical lymphadenopathy appriciated.  Lungs: Moderate air movement bil., decreased breath sounds at the bases, no crackles and no wheezes CARDIAC:RRR,No Gallops,Rubs or new Murmurs, No Parasternal Heave Abdomen: +ve B.Sounds, soft, minimal tenderness, No organomegaly appreciated, No rebound -guarding or rigidity. Extremities: No Cyanosis, Clubbing or edema, No new Rash or bruise       Data Review  CBC  Lab 05/11/11 0805 05/10/11 1210 05/10/11 0120 05/09/11 0314 05/08/11 0908 05/07/11 2045  WBC 10.3 -- 16.2* 26.6* 37.0* 18.2*  HGB 8.2* 8.0* 6.6* 7.8* 9.1* --  HCT 23.9* 23.1* 19.6* 22.2* 26.5* --  PLT 359 -- 347 417* 478* 589*  MCV 95.6 -- 98.5 98.2 99.3 99.4  MCH 32.8 -- 33.2 34.5* 34.1* 33.3  MCHC 34.3 -- 33.7 35.1 34.3 33.5  RDW 17.1* -- 16.7* 17.0* 16.7* 16.5*  LYMPHSABS -- -- -- -- -- 1.5  MONOABS -- -- -- --  -- 2.5*  EOSABS -- -- -- -- -- 0.0  BASOSABS -- -- -- -- -- 0.0  BANDABS -- -- -- -- -- --    Chemistries   Lab 05/11/11 0805 05/10/11 1210 05/10/11 0120 05/09/11 0314 05/08/11 0908 05/07/11 2045  NA 127* 128* 128* 125* 130* --  K 2.9* 3.3* 3.6 3.5 4.0 --  CL 97 100 101 94* 99 --  CO2 19 16* 16* 16* 18* --  GLUCOSE 88 93 123* 116* 140* --  BUN 8 15 22  34* 25* --  CREATININE 0.68 0.80 0.98 1.33* 1.46* --  CALCIUM 8.0* 7.5* 7.1* 7.2* 7.7* --  MG 1.0* 1.7 1.6 1.6 1.0* --  AST 17 -- 17 19 28  44*  ALT 19 -- 18 22 29  43*  ALKPHOS 150* -- 129* 151* 203* 332*  BILITOT 0.4 -- 0.3 0.4 0.5 0.8   ------------------------------------------------------------------------------------------------------------------ estimated creatinine clearance is 85.7 ml/min (by C-G formula based on Cr of 0.68). ------------------------------------------------------------------------------------------------------------------ No results found for this basename: HGBA1C:2 in the last 72 hours ------------------------------------------------------------------------------------------------------------------ No results found for this basename: CHOL:2,HDL:2,LDLCALC:2,TRIG:2,CHOLHDL:2,LDLDIRECT:2 in the last 72 hours ------------------------------------------------------------------------------------------------------------------  [facility] 05/09/11 0947  TSH 2.474  T4TOTAL --  T3FREE --  THYROIDAB --   ------------------------------------------------------------------------------------------------------------------  Basename 05/10/11 1210  VITAMINB12 293  FOLATE 10.8  FERRITIN 126  TIBC 305  IRON 157*  RETICCTPCT 2.0    Coagulation profile  Lab 05/10/11 0330  INR 1.16  PROTIME --    No results found for this basename: DDIMER:2 in the last 72 hours  Cardiac Enzymes  Lab 05/10/11 1210 05/10/11 0120 05/09/11 1706  CKMB 4.2* 4.6* 4.5*  TROPONINI <0.30 <0.30 <0.30  MYOGLOBIN -- -- --    ------------------------------------------------------------------------------------------------------------------ No components found with this basename: POCBNP:3  Micro Results Recent Results (from the past 240 hour(s))  STOOL CULTURE     Status: Normal   Collection Time   05/07/11 10:22 PM      Component Value Range Status Comment   Specimen Description STOOL   Final    Special Requests NONE   Final    Culture     Final    Value: NO SALMONELLA, SHIGELLA, CAMPYLOBACTER, OR YERSINIA ISOLATED   Report Status 05/11/2011 FINAL   Final   CLOSTRIDIUM DIFFICILE BY PCR     Status: Normal   Collection Time   05/07/11 10:22 PM      Component Value Range Status Comment   C difficile by pcr NEGATIVE  NEGATIVE  Final   MRSA PCR SCREENING     Status: Abnormal   Collection Time   05/08/11  5:04 AM      Component Value Range Status Comment   MRSA by PCR POSITIVE (*) NEGATIVE  Final   CULTURE, BLOOD (ROUTINE X 2)     Status: Normal (Preliminary result)   Collection Time   05/09/11  7:30 AM      Component Value Range Status Comment   Specimen Description BLOOD LEFT ARM   Final    Special Requests BOTTLES DRAWN AEROBIC AND ANAEROBIC 6CC   Final    Setup Time [id number]   Final    Culture     Final    Value:        BLOOD CULTURE RECEIVED NO GROWTH TO DATE CULTURE WILL BE HELD FOR 5 DAYS BEFORE ISSUING A FINAL NEGATIVE REPORT   Report Status PENDING   Incomplete   CULTURE, BLOOD (ROUTINE X 2)     Status: Normal (Preliminary result)   Collection Time   05/09/11  7:40 AM      Component Value Range Status Comment   Specimen Description BLOOD LEFT ARM   Final    Special Requests BOTTLES DRAWN AEROBIC AND ANAEROBIC 6CC   Final    Setup Time [id number]   Final    Culture     Final    Value:        BLOOD CULTURE RECEIVED NO GROWTH TO DATE CULTURE WILL BE HELD FOR 5 DAYS BEFORE ISSUING A FINAL NEGATIVE REPORT   Report Status PENDING   Incomplete     Radiology Reports No results  found.  Scheduled Meds:    . antiseptic oral rinse  15 mL Mouth Rinse q12n4p  . buPROPion  300 mg Oral Daily  . chlorhexidine  15 mL Mouth Rinse BID  . Chlorhexidine Gluconate Cloth  6 each Topical Q0600  . ciprofloxacin  400 mg Intravenous Q12H  . cyclobenzaprine  5 mg Oral TID  . diltiazem  180 mg Oral Daily  . lansoprazole  30 mg Oral Q1200  . magnesium sulfate 1 - 4 g bolus IVPB  1 g Intravenous Once  . metronidazole  500 mg Intravenous Q8H  . mupirocin ointment  1 application Nasal BID  . nicotine  21 mg Transdermal Daily  . vitamin A & D      . DISCONTD: famotidine  20 mg Oral BID  . DISCONTD: furosemide  20 mg Intravenous Once  . DISCONTD: magnesium sulfate  1 g Intravenous Once  . DISCONTD: metoprolol tartrate  25 mg Oral Q8H   Continuous Infusions:    . sodium chloride 150 mL/hr at 05/10/11 0437   PRN Meds:.morphine injection, oxyCODONE, promethazine, promethazine, zolpidem, DISCONTD: metoprolol  Assessment & Plan   1. Sudden onset of Diarrhea causing dehydration and Hypotension -suspected infectious colitis, per GI  in a pt with recent ABX exposure, but C. Difficile negative ? outpt diagnosis of Norovirus.   -continue cipro-flagyl, IVF, GI for further recommendations  2. Atrial fibrillation now converted to NSR -per cardiology, due to multiple cardiac stressors-(diarrhea with hypotension, recent pneumonia). Echo shows normal LV function, mild LVH. MAC. TFTs are normal. Now occ PVCs, PACs, continue oral Cardizem.  Pt went into   Afib- 150s at 2.45 pm on 05-09-11- now NSR post Cardizem IV per Cards, Echo stable, TSH, stable, likely from   3. H/O Hypertension - Controlled on current meds.  4. AOCD + Dilutional - hemoglobin stable status post transfusion, awaiting R. Hemoccult. post >7 lits IVF, pending Occ blood , check Anemia panel, 1 unit transfusion started last night as  H&H fell <7 post IVF 2lit bolus..   5. Pure hypercholesterolemia - home meds when able to  tolerate PO.   6.Chronic back pain and spasms - pain control, on flexeril will continue.   7. H/O Recent PNA - some atelectasis on CXR , add IS, up in chair.   8. Elevated Transaminases - no RUQ  Tenderness - stable trending to NML.   9. Acute renal insufficiency - from azotemia due to #1, creatinine normalized.   10. Hypomagnesemia and- we'll replace magnesium     11. Hyponatremia - na trending down, will resume IV fluids,  -Ur Sodium <10 showing pre renal azotemia despite improved creatinine and IVF ( NS about 6-7 lits so far).    .   DVT Prophylaxis  SCD   See all Orders from today for further details     Lindell Tussey C M.D on 05/11/2011 at 8:52 AM  [facility] Office  [phone]

## 2011-05-11 NOTE — Progress Notes (Signed)
   TELEMETRY: Reviewed telemetry pt in NSR rate 95, occ PAC and PVC: Filed Vitals:   05/11/11 0000 05/11/11 0300 05/11/11 0400 05/11/11 0607  BP: 140/78 142/64  150/80  Pulse:    85  Temp:   98.8 F (37.1 C)   TempSrc:   Oral   Resp: 17 16    Height:      Weight:      SpO2: 93% 96%      Intake/Output Summary (Last 24 hours) at 05/11/11 0742 Last data filed at 05/11/11 0400  Gross per 24 hour  Intake   3035 ml  Output   1450 ml  Net   1585 ml    SUBJECTIVE No diarrhea, abdominal cramps less. Denies SOB or chest pain. Taking po's better.  LABS: Basic Metabolic Panel:  Basename 05/10/11 1210 05/10/11 0120  NA 128* 128*  K 3.3* 3.6  CL 100 101  CO2 16* 16*  GLUCOSE 93 123*  BUN 15 22  CREATININE 0.80 0.98  CALCIUM 7.5* 7.1*  MG 1.7 1.6  PHOS 1.6* --   Liver Function Tests:  Basename 05/10/11 0120 05/09/11 0314  AST 17 19  ALT 18 22  ALKPHOS 129* 151*  BILITOT 0.3 0.4  PROT 5.4* 5.6*  ALBUMIN 2.5* 2.6*   No results found for this basename: LIPASE:2,AMYLASE:2 in the last 72 hours CBC:  Basename 05/10/11 1210 05/10/11 0120 05/09/11 0314  WBC -- 16.2* 26.6*  NEUTROABS -- -- --  HGB 8.0* 6.6* --  HCT 23.1* 19.6* --  MCV -- 98.5 98.2  PLT -- 347 417*   Cardiac Enzymes:  Basename 05/10/11 1210 05/10/11 0120 05/09/11 1706  CKTOTAL 50 52 58  CKMB 4.2* 4.6* 4.5*  CKMBINDEX -- -- --  TROPONINI <0.30 <0.30 <0.30    Anemia Panel:  Basename 05/10/11 1210  VITAMINB12 293  FOLATE 10.8  FERRITIN 126  TIBC 305  IRON 157*  RETICCTPCT 2.0      PHYSICAL EXAM General: Well developed, well nourished, in no acute distress. Head: Normocephalic, atraumatic, sclera non-icteric, no xanthomas, nares are without discharge. Neck: Negative for carotid bruits. JVD not elevated. Dry mucus membranes. Tongue thick. Lungs: Clear bilaterally to auscultation without wheezes, rales, or rhonchi. Breathing is unlabored. Heart: RRR S1 S2 without murmurs, rubs, or gallops.    Abdomen: Soft, non-tender, non-distended with normoactive bowel sounds. No hepatomegaly. No rebound/guarding. No obvious abdominal masses. Msk:  Strength and tone appears normal for age. Extremities: No clubbing, cyanosis or edema.  Distal pedal pulses are 2+ and equal bilaterally. Neuro: Alert and oriented X 3. Moves all extremities spontaneously. Psych:  Responds to questions appropriately with a normal affect.  ASSESSMENT AND PLAN:  1. Atrial fibrillation now converted to NSR. Due to multiple cardiac stressors. Echo shows normal LV function, mild LVH. MAC. TFTs are normal. Now occ PVCs, PACs  2. Severe anemia, transfused yesterday. Nurse reports Hbg of 8 today.  3.HTN  4. Acute diarrhea with elevated WBC  5. Dehydration.  6. Recent PNA  Plan: Patient given IV magnesium. She reports history of intolerance to beta blockers due to marked fatigue. Will switch to cardizem po.   Principal Problem:  *Diarrhea Active Problems:  Benign hypertensive heart disease without heart failure  Pure hypercholesterolemia  Anemia  Dehydration  Chronic back pain  Atrial fibrillation with rapid ventricular response    Signed, Peter [place] MD, [facility] 05/10/11

## 2011-05-11 NOTE — Progress Notes (Signed)
[facility] Progress Note  Subjective: Patient is doing better today, but still has some abdominal cramping. She denies diarrhea.  Objective: Vital signs in last 24 hours: Temp:  [98.2 F (36.8 C)-98.8 F (37.1 C)] 98.5 F (36.9 C) (01/23 0800) Pulse Rate:  [85-98] 95  (01/23 0810) Resp:  [16-21] 19  (01/23 0810) BP: (119-158)/(61-92) 158/92 mmHg (01/23 0810) SpO2:  [84 %-100 %] 92 % (01/23 0815) Weight change:    PE: She is in no distress  Heart regular rhythm  Abdomen: Bowel sounds are present it is soft there is some minimal tenderness but no rebound pain  Lab Results: Results for orders placed during the hospital encounter of 05/07/11 (from the past 24 hour(s))  CARDIAC PANEL(CRET KIN+CKTOT+MB+TROPI)     Status: Abnormal   Collection Time   05/10/11 12:10 PM      Component Value Range   Total CK 50  7 - 177 (U/L)   CK, MB 4.2 (*) 0.3 - 4.0 (ng/mL)   Troponin I <0.30  <0.30 (ng/mL)   Relative Index RELATIVE INDEX IS INVALID  0.0 - 2.5   HEMOGLOBIN AND HEMATOCRIT, BLOOD     Status: Abnormal   Collection Time   05/10/11 12:10 PM      Component Value Range   Hemoglobin 8.0 (*) 12.0 - 15.0 (g/dL)   HCT 16.1 (*) 09.6 - 46.0 (%)  VITAMIN B12     Status: Normal   Collection Time   05/10/11 12:10 PM      Component Value Range   Vitamin B-12 293  211 - 911 (pg/mL)  FOLATE     Status: Normal   Collection Time   05/10/11 12:10 PM      Component Value Range   Folate 10.8    IRON AND TIBC     Status: Abnormal   Collection Time   05/10/11 12:10 PM      Component Value Range   Iron 157 (*) 42 - 135 (ug/dL)   TIBC 045  409 - 811 (ug/dL)   Saturation Ratios 51  20 - 55 (%)   UIBC 148  125 - 400 (ug/dL)  FERRITIN     Status: Normal   Collection Time   05/10/11 12:10 PM      Component Value Range   Ferritin 126  10 - 291 (ng/mL)  RETICULOCYTES     Status: Abnormal   Collection Time   05/10/11 12:10 PM      Component Value Range   Retic Ct Pct 2.0  0.4 - 3.1 (%)     RBC. 2.41 (*) 3.87 - 5.11 (MIL/uL)   Retic Count, Manual 48.2  19.0 - 186.0 (K/uL)  BASIC METABOLIC PANEL     Status: Abnormal   Collection Time   05/10/11 12:10 PM      Component Value Range   Sodium 128 (*) 135 - 145 (mEq/L)   Potassium 3.3 (*) 3.5 - 5.1 (mEq/L)   Chloride 100  96 - 112 (mEq/L)   CO2 16 (*) 19 - 32 (mEq/L)   Glucose, Bld 93  70 - 99 (mg/dL)   BUN 15  6 - 23 (mg/dL)   Creatinine, Ser 9.14  0.50 - 1.10 (mg/dL)   Calcium 7.5 (*) 8.4 - 10.5 (mg/dL)   GFR calc non Af Amer 78 (*) >90 (mL/min)   GFR calc Af Amer >90  >90 (mL/min)  MAGNESIUM     Status: Normal   Collection Time   05/10/11 12:10 PM  Component Value Range   Magnesium 1.7  1.5 - 2.5 (mg/dL)  PHOSPHORUS     Status: Abnormal   Collection Time   05/10/11 12:10 PM      Component Value Range   Phosphorus 1.6 (*) 2.3 - 4.6 (mg/dL)  CBC     Status: Abnormal   Collection Time   05/11/11  8:05 AM      Component Value Range   WBC 10.3  4.0 - 10.5 (K/uL)   RBC 2.50 (*) 3.87 - 5.11 (MIL/uL)   Hemoglobin 8.2 (*) 12.0 - 15.0 (g/dL)   HCT 40.9 (*) 81.1 - 46.0 (%)   MCV 95.6  78.0 - 100.0 (fL)   MCH 32.8  26.0 - 34.0 (pg)   MCHC 34.3  30.0 - 36.0 (g/dL)   RDW 91.4 (*) 78.2 - 15.5 (%)   Platelets 359  150 - 400 (K/uL)  COMPREHENSIVE METABOLIC PANEL     Status: Abnormal   Collection Time   05/11/11  8:05 AM      Component Value Range   Sodium 127 (*) 135 - 145 (mEq/L)   Potassium 2.9 (*) 3.5 - 5.1 (mEq/L)   Chloride 97  96 - 112 (mEq/L)   CO2 19  19 - 32 (mEq/L)   Glucose, Bld 88  70 - 99 (mg/dL)   BUN 8  6 - 23 (mg/dL)   Creatinine, Ser 9.56  0.50 - 1.10 (mg/dL)   Calcium 8.0 (*) 8.4 - 10.5 (mg/dL)   Total Protein 5.8 (*) 6.0 - 8.3 (g/dL)   Albumin 2.7 (*) 3.5 - 5.2 (g/dL)   AST 17  0 - 37 (U/L)   ALT 19  0 - 35 (U/L)   Alkaline Phosphatase 150 (*) 39 - 117 (U/L)   Total Bilirubin 0.4  0.3 - 1.2 (mg/dL)   GFR calc non Af Amer >90  >90 (mL/min)   GFR calc Af Amer >90  >90 (mL/min)  MAGNESIUM      Status: Abnormal   Collection Time   05/11/11  8:05 AM      Component Value Range   Magnesium 1.0 (*) 1.5 - 2.5 (mg/dL)    Studies/Results: @[id number] @    Assessment: Suspect infectious colitis/clinically she seems to be improving  Plan: Continue current management    GANEM,SALEM F 05/11/2011, 9:31 AM

## 2011-05-12 ENCOUNTER — Other Ambulatory Visit: Payer: Self-pay

## 2011-05-12 LAB — CBC
HCT: 25.4 % — ABNORMAL LOW (ref 36.0–46.0)
Hemoglobin: 8.7 g/dL — ABNORMAL LOW (ref 12.0–15.0)
MCH: 32 pg (ref 26.0–34.0)
MCV: 93.4 fL (ref 78.0–100.0)
Platelets: 322 10*3/uL (ref 150–400)
RBC: 2.72 MIL/uL — ABNORMAL LOW (ref 3.87–5.11)
WBC: 7.7 10*3/uL (ref 4.0–10.5)

## 2011-05-12 LAB — COMPREHENSIVE METABOLIC PANEL
AST: 17 U/L (ref 0–37)
BUN: 5 mg/dL — ABNORMAL LOW (ref 6–23)
CO2: 20 mEq/L (ref 19–32)
Calcium: 7.5 mg/dL — ABNORMAL LOW (ref 8.4–10.5)
Chloride: 100 mEq/L (ref 96–112)
Creatinine, Ser: 0.58 mg/dL (ref 0.50–1.10)
GFR calc Af Amer: >90 mL/min (ref >90)
GFR calc non Af Amer: >90 mL/min (ref >90)
Glucose, Bld: 89 mg/dL (ref 70–99)
Total Bilirubin: 0.3 mg/dL (ref 0.3–1.2)

## 2011-05-12 MED ORDER — POTASSIUM CHLORIDE CRYS ER 20 MEQ PO TBCR
40.0000 meq | EXTENDED_RELEASE_TABLET | ORAL | Status: AC
  Administered 2011-05-12 (×3): 40 meq via ORAL
  Filled 2011-05-12 (×4): qty 2

## 2011-05-12 MED ORDER — DEXTROSE 5 % IV SOLN
Freq: Once | INTRAVENOUS | Status: AC
  Administered 2011-05-12: 10:00:00 via INTRAVENOUS
  Filled 2011-05-12: qty 100

## 2011-05-12 MED ORDER — POTASSIUM CHLORIDE CRYS ER 20 MEQ PO TBCR: 40.0000 meq | EXTENDED_RELEASE_TABLET | Freq: Once | ORAL | Status: DC

## 2011-05-12 MED ORDER — MAGNESIUM SULFATE 40 MG/ML IJ SOLN
4.0000 g | Freq: Once | INTRAMUSCULAR | Status: DC
  Filled 2011-05-12: qty 100

## 2011-05-12 NOTE — Progress Notes (Signed)
TELEMETRY: Reviewed telemetry pt in NSR, had brief episode of Afib last night with rate up to 130. Asymptomatic.: Filed Vitals:   05/12/11 0200 05/12/11 0300 05/12/11 0800 05/12/11 0830  BP: 114/56 117/60  151/77  Pulse:    90  Temp:   98.9 F (37.2 C)   TempSrc:   Oral   Resp: 16 15 17 19   Height:      Weight:      SpO2:  94%  94%    Intake/Output Summary (Last 24 hours) at 05/12/11 0958 Last data filed at 05/12/11 0049  Gross per 24 hour  Intake   1860 ml  Output   2050 ml  Net   -190 ml    SUBJECTIVE No diarrhea, abdominal cramps less. Denies SOB or chest pain. Taking po's better.  LABS: Basic Metabolic Panel:  Basename 05/12/11 0630 05/11/11 0805 05/10/11 1210  NA 132* 127* --  K 2.9* 2.9* --  CL 100 97 --  CO2 20 19 --  GLUCOSE 89 88 --  BUN 5* 8 --  CREATININE 0.58 0.68 --  CALCIUM 7.5* 8.0* --  MG 1.0* 1.0* --  PHOS -- -- 1.6*   Liver Function Tests:  Basename 05/12/11 0630 05/11/11 0805  AST 17 17  ALT 16 19  ALKPHOS 143* 150*  BILITOT 0.3 0.4  PROT 5.6* 5.8*  ALBUMIN 2.7* 2.7*   No results found for this basename: LIPASE:2,AMYLASE:2 in the last 72 hours CBC:  Basename 05/12/11 0630 05/11/11 0805  WBC 7.7 10.3  NEUTROABS -- --  HGB 8.7* 8.2*  HCT 25.4* 23.9*  MCV 93.4 95.6  PLT 322 359   Cardiac Enzymes:  Basename 05/10/11 1210 05/10/11 0120 05/09/11 1706  CKTOTAL 50 52 58  CKMB 4.2* 4.6* 4.5*  CKMBINDEX -- -- --  TROPONINI <0.30 <0.30 <0.30    Anemia Panel:  Basename 05/10/11 1210  VITAMINB12 293  FOLATE 10.8  FERRITIN 126  TIBC 305  IRON 157*  RETICCTPCT 2.0      PHYSICAL EXAM General: Well developed, well nourished, in no acute distress. Head: Normocephalic, atraumatic, sclera non-icteric, no xanthomas, nares are without discharge. Neck: Negative for carotid bruits. JVD not elevated. Dry mucus membranes. Tongue thick. Lungs: Clear bilaterally to auscultation without wheezes, rales, or rhonchi. Breathing is  unlabored. Heart: RRR S1 S2 without murmurs, rubs, or gallops.  Abdomen: Soft, non-tender, non-distended with normoactive bowel sounds. No hepatomegaly. No rebound/guarding. No obvious abdominal masses. Msk:  Strength and tone appears normal for age. Extremities: No clubbing, cyanosis or edema.  Distal pedal pulses are 2+ and equal bilaterally. Neuro: Alert and oriented X 3. Moves all extremities spontaneously. Psych:  Responds to questions appropriately with a normal affect.  ASSESSMENT AND PLAN:  1. Atrial fibrillation now converted to NSR. Due to multiple cardiac stressors. Echo shows normal LV function, mild LVH. MAC. TFTs are normal. Now occ PVCs, PACs. Some recurrence last night but now back in NSR.  2. Severe anemia, transfused yesterday. Nurse reports Hbg of 8.7 today.  3.HTN, controlled  4. Acute diarrhea with elevated WBC  5. Dehydration.  6. Recent PNA  Plan: she received her 1st dose of diltiazem yesterday. Will continue current RX and monitor. If recurrent Afib could increase diltiazem dose. OK to transfer to telemetry.  Principal Problem:  *Diarrhea Active Problems:  Benign hypertensive heart disease without heart failure  Pure hypercholesterolemia  Anemia  Dehydration  Chronic back pain  Atrial fibrillation with rapid ventricular response  Signed, Johnnay Pleitez [place] MD, [facility] 04/2411

## 2011-05-12 NOTE — Progress Notes (Signed)
[facility] Progress Note  Subjective: No complaints of diarrhea. Some abdominal cramping persists.  Objective: Vital signs in last 24 hours: Temp:  [97.7 F (36.5 C)-98.9 F (37.2 C)] 97.7 F (36.5 C) (01/24 1220) Pulse Rate:  [90-94] 94  (01/24 1220) Resp:  [15-22] 18  (01/24 1220) BP: (100-161)/(56-92) 161/92 mmHg (01/24 1220) SpO2:  [93 %-94 %] 94 % (01/24 0830) Weight change:    PE: Alert  Heart regular rhythm  Abdomen: Bowel sounds present, soft, mild discomfort to palpation but no rebound or guarding  Lab Results: Results for orders placed during the hospital encounter of 05/07/11 (from the past 24 hour(s))  CBC     Status: Abnormal   Collection Time   05/12/11  6:30 AM      Component Value Range   WBC 7.7  4.0 - 10.5 (K/uL)   RBC 2.72 (*) 3.87 - 5.11 (MIL/uL)   Hemoglobin 8.7 (*) 12.0 - 15.0 (g/dL)   HCT 08.6 (*) 57.8 - 46.0 (%)   MCV 93.4  78.0 - 100.0 (fL)   MCH 32.0  26.0 - 34.0 (pg)   MCHC 34.3  30.0 - 36.0 (g/dL)   RDW 46.9 (*) 62.9 - 15.5 (%)   Platelets 322  150 - 400 (K/uL)  COMPREHENSIVE METABOLIC PANEL     Status: Abnormal   Collection Time   05/12/11  6:30 AM      Component Value Range   Sodium 132 (*) 135 - 145 (mEq/L)   Potassium 2.9 (*) 3.5 - 5.1 (mEq/L)   Chloride 100  96 - 112 (mEq/L)   CO2 20  19 - 32 (mEq/L)   Glucose, Bld 89  70 - 99 (mg/dL)   BUN 5 (*) 6 - 23 (mg/dL)   Creatinine, Ser 5.28  0.50 - 1.10 (mg/dL)   Calcium 7.5 (*) 8.4 - 10.5 (mg/dL)   Total Protein 5.6 (*) 6.0 - 8.3 (g/dL)   Albumin 2.7 (*) 3.5 - 5.2 (g/dL)   AST 17  0 - 37 (U/L)   ALT 16  0 - 35 (U/L)   Alkaline Phosphatase 143 (*) 39 - 117 (U/L)   Total Bilirubin 0.3  0.3 - 1.2 (mg/dL)   GFR calc non Af Amer >90  >90 (mL/min)   GFR calc Af Amer >90  >90 (mL/min)  MAGNESIUM     Status: Abnormal   Collection Time   05/12/11  6:30 AM      Component Value Range   Magnesium 1.0 (*) 1.5 - 2.5 (mg/dL)     Studies/Results: @RISRSLT24 @    Assessment: Infectious versus inflammatory colitis, symptoms gradually improving. White blood cell count has normalized  Hypokalemia  Hypomagnesemia  Plan: Continue supportive medical care.Correct electrolyte abnormalities   Diarra Kos F 05/12/2011, 12:32 PM

## 2011-05-12 NOTE — Progress Notes (Addendum)
[name] CSN:[id number]:[id number] is a 61 y.o. female,  Outpatient Primary MD for the patient is No primary provider on file.  Chief Complaint  Patient presents with  . Abdominal Pain  . Hypotension  . Diarrhea        Subjective:    Still some abdominal cramping more in LLQ, had small BM yesterday, no gross blood. +nausea, but no vomiting. C/o bloating/increased gas this am, tachy last pm per nsg and received prn dose of metoprolol. Denies CP, no SOB. Objective:   Filed Vitals:   05/12/11 0200 05/12/11 0300 05/12/11 0800 05/12/11 0830  BP: 114/56 117/60  151/77  Pulse:    90  Temp:   98.9 F (37.2 C)   TempSrc:   Oral   Resp: 16 15 17 19   Height:      Weight:      SpO2:  94%  94%    Wt Readings from Last 3 Encounters:  05/09/11 94.9 kg (209 lb 3.5 oz)     Intake/Output Summary (Last 24 hours) at 05/12/11 0846 Last data filed at 05/12/11 0049  Gross per 24 hour  Intake   1960 ml  Output   2300 ml  Net   -340 ml    Exam Awake Alert, Oriented *3, No new F.N deficits, Normal affect HEENT: [place].AT,PERRAL Neck: Supple Neck,No JVD, No cervical lymphadenopathy appriciated.  Lungs: Moderate air movement bil., decreased breath sounds at the bases, no crackles and no wheezes CARDIAC:RRR,No Gallops,Rubs or new Murmurs, No Parasternal Heave Abdomen: +ve B.Sounds, soft, mild diffuse tenderness- greater in L.side , No organomegaly appreciated, No rebound -guarding or rigidity. Extremities: No Cyanosis, Clubbing or edema.      Data Review  CBC  Lab 05/12/11 0630 05/11/11 0805 05/10/11 1210 05/10/11 0120 05/09/11 0314 05/08/11 0908 05/07/11 2045  WBC 7.7 10.3 -- 16.2* 26.6* 37.0* --  HGB 8.7* 8.2* 8.0* 6.6* 7.8* -- --  HCT 25.4* 23.9* 23.1* 19.6* 22.2* -- --  PLT 322 359 -- 347 417* 478* --  MCV 93.4 95.6 -- 98.5 98.2 99.3 --  MCH 32.0 32.8 -- 33.2 34.5* 34.1* --  MCHC 34.3 34.3 -- 33.7 35.1 34.3 --  RDW 16.9* 17.1* -- 16.7* 17.0* 16.7* --  LYMPHSABS -- -- -- -- -- --  1.5  MONOABS -- -- -- -- -- -- 2.5*  EOSABS -- -- -- -- -- -- 0.0  BASOSABS -- -- -- -- -- -- 0.0  BANDABS -- -- -- -- -- -- --    Chemistries   Lab 05/12/11 0630 05/11/11 0805 05/10/11 1210 05/10/11 0120 05/09/11 0314 05/08/11 0908  NA 132* 127* 128* 128* 125* --  K 2.9* 2.9* 3.3* 3.6 3.5 --  CL 100 97 100 101 94* --  CO2 20 19 16* 16* 16* --  GLUCOSE 89 88 93 123* 116* --  BUN 5* 8 15 22  34* --  CREATININE 0.58 0.68 0.80 0.98 1.33* --  CALCIUM 7.5* 8.0* 7.5* 7.1* 7.2* --  MG 1.0* 1.0* 1.7 1.6 1.6 --  AST 17 17 -- 17 19 28   ALT 16 19 -- 18 22 29   ALKPHOS 143* 150* -- 129* 151* 203*  BILITOT 0.3 0.4 -- 0.3 0.4 0.5   ------------------------------------------------------------------------------------------------------------------ estimated creatinine clearance is 84.6 ml/min (by C-G formula based on Cr of 0.58). ------------------------------------------------------------------------------------------------------------------ No results found for this basename: HGBA1C:2 in the last 72 hours ------------------------------------------------------------------------------------------------------------------ No results found for this basename: CHOL:2,HDL:2,LDLCALC:2,TRIG:2,CHOLHDL:2,LDLDIRECT:2 in the last 72 hours ------------------------------------------------------------------------------------------------------------------  [facility] 05/09/11 1610  TSH 2.474  T4TOTAL --  T3FREE --  THYROIDAB --   ------------------------------------------------------------------------------------------------------------------  Basename 05/10/11 1210  VITAMINB12 293  FOLATE 10.8  FERRITIN 126  TIBC 305  IRON 157*  RETICCTPCT 2.0    Coagulation profile  Lab 05/10/11 0330  INR 1.16  PROTIME --    No results found for this basename: DDIMER:2 in the last 72 hours  Cardiac Enzymes  Lab 05/10/11 1210 05/10/11 0120 05/09/11 1706  CKMB 4.2* 4.6* 4.5*  TROPONINI <0.30 <0.30 <0.30    MYOGLOBIN -- -- --   ------------------------------------------------------------------------------------------------------------------ No components found with this basename: POCBNP:3  Micro Results Recent Results (from the past 240 hour(s))  STOOL CULTURE     Status: Normal   Collection Time   05/07/11 10:22 PM      Component Value Range Status Comment   Specimen Description STOOL   Final    Special Requests NONE   Final    Culture     Final    Value: NO SALMONELLA, SHIGELLA, CAMPYLOBACTER, OR YERSINIA ISOLATED   Report Status 05/11/2011 FINAL   Final   CLOSTRIDIUM DIFFICILE BY PCR     Status: Normal   Collection Time   05/07/11 10:22 PM      Component Value Range Status Comment   C difficile by pcr NEGATIVE  NEGATIVE  Final   MRSA PCR SCREENING     Status: Abnormal   Collection Time   05/08/11  5:04 AM      Component Value Range Status Comment   MRSA by PCR POSITIVE (*) NEGATIVE  Final   CULTURE, BLOOD (ROUTINE X 2)     Status: Normal (Preliminary result)   Collection Time   05/09/11  7:30 AM      Component Value Range Status Comment   Specimen Description BLOOD LEFT ARM   Final    Special Requests BOTTLES DRAWN AEROBIC AND ANAEROBIC 6CC   Final    Setup Time [id number]   Final    Culture     Final    Value:        BLOOD CULTURE RECEIVED NO GROWTH TO DATE CULTURE WILL BE HELD FOR 5 DAYS BEFORE ISSUING A FINAL NEGATIVE REPORT   Report Status PENDING   Incomplete   CULTURE, BLOOD (ROUTINE X 2)     Status: Normal (Preliminary result)   Collection Time   05/09/11  7:40 AM      Component Value Range Status Comment   Specimen Description BLOOD LEFT ARM   Final    Special Requests BOTTLES DRAWN AEROBIC AND ANAEROBIC 6CC   Final    Setup Time [id number]   Final    Culture     Final    Value:        BLOOD CULTURE RECEIVED NO GROWTH TO DATE CULTURE WILL BE HELD FOR 5 DAYS BEFORE ISSUING A FINAL NEGATIVE REPORT   Report Status PENDING   Incomplete     Radiology Reports No  results found.  Scheduled Meds:    . antiseptic oral rinse  15 mL Mouth Rinse q12n4p  . buPROPion  300 mg Oral Daily  . chlorhexidine  15 mL Mouth Rinse BID  . Chlorhexidine Gluconate Cloth  6 each Topical Q0600  . ciprofloxacin  400 mg Intravenous Q12H  . cyclobenzaprine  5 mg Oral TID  . dextromethorphan-guaiFENesin  1 tablet Oral BID  . diltiazem  180 mg Oral Daily  . lansoprazole  30 mg Oral Q1200  . magnesium sulfate  1 - 4 g bolus IVPB  4 g Intravenous Once  . magnesium sulfate 1 - 4 g bolus IVPB  4 g Intravenous Once  . metoprolol  5 mg Intravenous Once  . metronidazole  500 mg Intravenous Q8H  . mupirocin ointment  1 application Nasal BID  . nicotine  14 mg Transdermal Daily  . potassium chloride  10 mEq Intravenous Q1 Hr x 4  . potassium chloride  40 mEq Oral Q4H  . potassium chloride  40 mEq Oral Once  . DISCONTD: nicotine  21 mg Transdermal Daily   Continuous Infusions:    . sodium chloride 100 mL/hr at 05/12/11 0827   PRN Meds:.morphine injection, oxyCODONE, promethazine, promethazine, zolpidem  Assessment & Plan   1. Sudden onset of Diarrhea causing dehydration and Hypotension -suspected infectious colitis, per GI  in a pt with recent ABX exposure, but C. Difficile negative ? outpt diagnosis of Norovirus.   -continue cipro-flagyl, IVF, GI following  2. Atrial fibrillation now converted to NSR - tachy last pm to 130s, but back to NSR this am, continue oral cardizem, cardiology  Following for further recs.  per cardiology, due to multiple cardiac stressors-(diarrhea with hypotension, recent pneumonia). Echo shows normal LV function, mild LVH. MAC. Pt went into   Afib- 150s at 2.45 pm on 05-09-11- now NSR post Cardizem IV per Cards, Echo stable, TSH, stable, likely from   3. H/O Hypertension - Controlled on current meds.  4. AOCD + Dilutional - hemoglobin stable status post transfusion, awaiting R. Hemoccult. post >7 lits IVF, pending Occ blood , check Anemia  panel, 1 unit transfusion started last night as  H&H fell <7 post IVF 2lit bolus..   5. Pure hypercholesterolemia - home meds when able to tolerate PO.   6.Chronic back pain and spasms - pain control, on flexeril will continue.   7. H/O Recent PNA - some atelectasis on CXR , add IS, up in chair.   8. Elevated Transaminases - no RUQ  Tenderness - stable trending to NML.   9. Acute renal insufficiency - from azotemia due to #1, creatinine normalized.   10. Hypomagnesemia and-  No change, we'll again  replace    11. Hyponatremia - improving with IV fluids,  -Ur Sodium <10 showing pre renal azotemia despite improved creatinine and IVF ( NS about 6-7 lits so far).   12. Hypokalemia- replace k .   DVT Prophylaxis  SCD  Pt remaining hemodynamically stable, will transfer to [facility] C M.D on 05/12/2011 at 8:46 AM  [facility] Office  [phone]

## 2011-05-12 NOTE — Progress Notes (Signed)
UR completed 

## 2011-05-13 LAB — CBC
MCV: 95.3 fL (ref 78.0–100.0)
Platelets: 377 10*3/uL (ref 150–400)
RBC: 2.74 MIL/uL — ABNORMAL LOW (ref 3.87–5.11)
RDW: 17.2 % — ABNORMAL HIGH (ref 11.5–15.5)
WBC: 10.5 10*3/uL (ref 4.0–10.5)

## 2011-05-13 LAB — COMPREHENSIVE METABOLIC PANEL
ALT: 16 U/L (ref 0–35)
AST: 16 U/L (ref 0–37)
Albumin: 2.6 g/dL — ABNORMAL LOW (ref 3.5–5.2)
Calcium: 7.4 mg/dL — ABNORMAL LOW (ref 8.4–10.5)
Chloride: 96 mEq/L (ref 96–112)
Creatinine, Ser: 0.6 mg/dL (ref 0.50–1.10)
Sodium: 129 mEq/L — ABNORMAL LOW (ref 135–145)

## 2011-05-13 MED ORDER — MAGNESIUM SULFATE 40 MG/ML IJ SOLN
4.0000 g | Freq: Once | INTRAMUSCULAR | Status: DC
  Filled 2011-05-13: qty 100

## 2011-05-13 MED ORDER — MAGNESIUM SULFATE 50 % IJ SOLN
Freq: Once | INTRAVENOUS | Status: AC
  Administered 2011-05-13: 15:00:00 via INTRAVENOUS
  Filled 2011-05-13: qty 250

## 2011-05-13 MED ORDER — POTASSIUM CHLORIDE CRYS ER 20 MEQ PO TBCR
40.0000 meq | EXTENDED_RELEASE_TABLET | Freq: Once | ORAL | Status: AC
  Administered 2011-05-13: 40 meq via ORAL
  Filled 2011-05-13: qty 2

## 2011-05-13 NOTE — Progress Notes (Signed)
   TELEMETRY: Reviewed telemetry pt in NSR. Filed Vitals:   05/12/11 1828 05/12/11 2144 05/13/11 0158 05/13/11 0539  BP: 154/88 136/81 103/66 130/76  Pulse: 101 97 101 98  Temp: 98.8 F (37.1 C) 98.5 F (36.9 C) 99.2 F (37.3 C) 98.5 F (36.9 C)  TempSrc: Oral Oral Oral Oral  Resp: 16 16 20 18   Height:      Weight:      SpO2:  94% 95% 95%    Intake/Output Summary (Last 24 hours) at 05/13/11 0800 Last data filed at 05/13/11 0600  Gross per 24 hour  Intake   3900 ml  Output    300 ml  Net   3600 ml    SUBJECTIVE Feels bloated, crampy. Denies SOB or chest pain. Notes mild edema.  LABS: Basic Metabolic Panel:  Basename 05/13/11 0510 05/12/11 0630 05/10/11 1210  NA 129* 132* --  K 3.4* 2.9* --  CL 96 100 --  CO2 20 20 --  GLUCOSE 108* 89 --  BUN 4* 5* --  CREATININE 0.60 0.58 --  CALCIUM 7.4* 7.5* --  MG 1.0* 1.0* --  PHOS -- -- 1.6*   Liver Function Tests:  Basename 05/13/11 0510 05/12/11 0630  AST 16 17  ALT 16 16  ALKPHOS 136* 143*  BILITOT 0.3 0.3  PROT 5.6* 5.6*  ALBUMIN 2.6* 2.7*   No results found for this basename: LIPASE:2,AMYLASE:2 in the last 72 hours CBC:  Basename 05/13/11 0510 05/12/11 0630  WBC 10.5 7.7  NEUTROABS -- --  HGB 9.1* 8.7*  HCT 26.1* 25.4*  MCV 95.3 93.4  PLT 377 322   Cardiac Enzymes:  Basename 05/10/11 1210  CKTOTAL 50  CKMB 4.2*  CKMBINDEX --  TROPONINI <0.30    Anemia Panel:  Basename 05/10/11 1210  VITAMINB12 293  FOLATE 10.8  FERRITIN 126  TIBC 305  IRON 157*  RETICCTPCT 2.0      PHYSICAL EXAM General: Well developed, well nourished, in no acute distress. Head: Normocephalic, atraumatic, sclera non-icteric, no xanthomas, nares are without discharge. Neck: Negative for carotid bruits. JVD not elevated. Dry mucus membranes. Tongue thick. Lungs: Clear bilaterally to auscultation without wheezes, rales, or rhonchi. Breathing is unlabored. Heart: RRR S1 S2 without murmurs, rubs, or gallops.  Abdomen:  Soft, non-tender, non-distended with normoactive bowel sounds. No hepatomegaly. No rebound/guarding. No obvious abdominal masses. Msk:  Strength and tone appears normal for age. Extremities: No clubbing, cyanosis or edema.  Distal pedal pulses are 2+ and equal bilaterally. Neuro: Alert and oriented X 3. Moves all extremities spontaneously. Psych:  Responds to questions appropriately with a normal affect.  ASSESSMENT AND PLAN:  1. Atrial fibrillation now converted to NSR. Due to multiple cardiac stressors. Echo shows normal LV function, mild LVH. MAC. TFTs are normal. Now occ PVCs, PACs. Now back in NSR.  2. Severe anemia, transfused. Hgb increased to 9.1.  3.HTN, controlled  4. Acute diarrhea with elevated WBC  5. Dehydration.  6. Recent PNA  Plan: Continue diltiazem for paroxysmal afib and HTN. Replete K+ and Mg++. No active cardiac issues. Will sign off. Patient has a follow up visit with Dr. Patty Sermons in Feb. Please call with questions.  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

## 2011-05-13 NOTE — Progress Notes (Signed)
[facility] Progress Note  Subjective: She still has some abdominal cramping, and had a loose bowel movement today. Overall however she states that she is feeling better on a day-to-day basis.  Objective: Vital signs in last 24 hours: Temp:  [97.7 F (36.5 C)-99.2 F (37.3 C)] 98.3 F (36.8 C) (01/25 1007) Pulse Rate:  [93-101] 99  (01/25 1007) Resp:  [16-20] 18  (01/25 1007) BP: (103-161)/(66-92) 146/84 mmHg (01/25 1007) SpO2:  [94 %-99 %] 94 % (01/25 1007) Weight change:    PE: Gen. she is in no distress  Heart regular rhythm  Abdomen: Bowel sounds are present it is soft with some mild discomfort on the left side, but no rebound or guarding  Lab Results: Results for orders placed during the hospital encounter of 05/07/11 (from the past 24 hour(s))  CBC     Status: Abnormal   Collection Time   05/13/11  5:10 AM      Component Value Range   WBC 10.5  4.0 - 10.5 (K/uL)   RBC 2.74 (*) 3.87 - 5.11 (MIL/uL)   Hemoglobin 9.1 (*) 12.0 - 15.0 (g/dL)   HCT 16.1 (*) 09.6 - 46.0 (%)   MCV 95.3  78.0 - 100.0 (fL)   MCH 33.2  26.0 - 34.0 (pg)   MCHC 34.9  30.0 - 36.0 (g/dL)   RDW 04.5 (*) 40.9 - 15.5 (%)   Platelets 377  150 - 400 (K/uL)  COMPREHENSIVE METABOLIC PANEL     Status: Abnormal   Collection Time   05/13/11  5:10 AM      Component Value Range   Sodium 129 (*) 135 - 145 (mEq/L)   Potassium 3.4 (*) 3.5 - 5.1 (mEq/L)   Chloride 96  96 - 112 (mEq/L)   CO2 20  19 - 32 (mEq/L)   Glucose, Bld 108 (*) 70 - 99 (mg/dL)   BUN 4 (*) 6 - 23 (mg/dL)   Creatinine, Ser 8.11  0.50 - 1.10 (mg/dL)   Calcium 7.4 (*) 8.4 - 10.5 (mg/dL)   Total Protein 5.6 (*) 6.0 - 8.3 (g/dL)   Albumin 2.6 (*) 3.5 - 5.2 (g/dL)   AST 16  0 - 37 (U/L)   ALT 16  0 - 35 (U/L)   Alkaline Phosphatase 136 (*) 39 - 117 (U/L)   Total Bilirubin 0.3  0.3 - 1.2 (mg/dL)   GFR calc non Af Amer >90  >90 (mL/min)   GFR calc Af Amer >90  >90 (mL/min)  MAGNESIUM     Status: Abnormal   Collection Time   05/13/11  5:10 AM      Component Value Range   Magnesium 1.0 (*) 1.5 - 2.5 (mg/dL)    Studies/Results: @RISRSLT24 @    Assessment: Infectious/inflammatory colitis with gradual clinical improvement day-to-day  Plan: Continue current management and clinical followup.    Graylin Shiver 05/13/2011, 12:14 PM

## 2011-05-13 NOTE — Progress Notes (Signed)
[name] CSN:[id number],MRN:[id number] is a 61 y.o. female,  Outpatient Primary MD for the patient is No primary provider on file.  Chief Complaint  Patient presents with  . Abdominal Pain  . Hypotension  . Diarrhea        Subjective:    Sitting up in chair, and states she feels better but still bloated. Reports diarrhea x2 today. Denies nausea vomiting. Objective:   Filed Vitals:   05/12/11 2144 05/13/11 0158 05/13/11 0539 05/13/11 1007  BP: 136/81 103/66 130/76 146/84  Pulse: 97 101 98 99  Temp: 98.5 F (36.9 C) 99.2 F (37.3 C) 98.5 F (36.9 C) 98.3 F (36.8 C)  TempSrc: Oral Oral Oral Oral  Resp: 16 20 18 18   Height:      Weight:      SpO2: 94% 95% 95% 94%    Wt Readings from Last 3 Encounters:  05/09/11 94.9 kg (209 lb 3.5 oz)     Intake/Output Summary (Last 24 hours) at 05/13/11 1334 Last data filed at 05/13/11 0900  Gross per 24 hour  Intake   3900 ml  Output    300 ml  Net   3600 ml    Exam Awake Alert, Oriented *3, No new F.N deficits, Normal affect HEENT: .AT,PERRAL Neck: Supple Neck,No JVD, No cervical lymphadenopathy appriciated.  Lungs: Clear, decreased breath sounds at bases no crackles and no wheezes CARDIAC:RRR,No Gallops,Rubs or new Murmurs, No Parasternal Heave Abdomen: +ve B.Sounds, soft, mild lower abdominal tenderness , No organomegaly appreciated, No rebound -guarding or rigidity. Extremities: No Cyanosis, Clubbing or edema.      Data Review  CBC  Lab 05/13/11 0510 05/12/11 0630 05/11/11 0805 05/10/11 1210 05/10/11 0120 05/09/11 0314 05/07/11 2045  WBC 10.5 7.7 10.3 -- 16.2* 26.6* --  HGB 9.1* 8.7* 8.2* 8.0* 6.6* -- --  HCT 26.1* 25.4* 23.9* 23.1* 19.6* -- --  PLT 377 322 359 -- 347 417* --  MCV 95.3 93.4 95.6 -- 98.5 98.2 --  MCH 33.2 32.0 32.8 -- 33.2 34.5* --  MCHC 34.9 34.3 34.3 -- 33.7 35.1 --  RDW 17.2* 16.9* 17.1* -- 16.7* 17.0* --  LYMPHSABS -- -- -- -- -- -- 1.5  MONOABS -- -- -- -- -- -- 2.5*  EOSABS -- -- -- -- -- --  0.0  BASOSABS -- -- -- -- -- -- 0.0  BANDABS -- -- -- -- -- -- --    Chemistries   Lab 05/13/11 0510 05/12/11 0630 05/11/11 0805 05/10/11 1210 05/10/11 0120 05/09/11 0314  NA 129* 132* 127* 128* 128* --  K 3.4* 2.9* 2.9* 3.3* 3.6 --  CL 96 100 97 100 101 --  CO2 20 20 19  16* 16* --  GLUCOSE 108* 89 88 93 123* --  BUN 4* 5* 8 15 22  --  CREATININE 0.60 0.58 0.68 0.80 0.98 --  CALCIUM 7.4* 7.5* 8.0* 7.5* 7.1* --  MG 1.0* 1.0* 1.0* 1.7 1.6 --  AST 16 17 17  -- 17 19  ALT 16 16 19  -- 18 22  ALKPHOS 136* 143* 150* -- 129* 151*  BILITOT 0.3 0.3 0.4 -- 0.3 0.4   ------------------------------------------------------------------------------------------------------------------ estimated creatinine clearance is 84.6 ml/min (by C-G formula based on Cr of 0.6). ------------------------------------------------------------------------------------------------------------------ No results found for this basename: HGBA1C:2 in the last 72 hours ------------------------------------------------------------------------------------------------------------------ No results found for this basename: CHOL:2,HDL:2,LDLCALC:2,TRIG:2,CHOLHDL:2,LDLDIRECT:2 in the last 72 hours ------------------------------------------------------------------------------------------------------------------ No results found for this basename: TSH,T4TOTAL,FREET3,T3FREE,THYROIDAB in the last 72 hours ------------------------------------------------------------------------------------------------------------------ No results found for this basename: VITAMINB12:2,FOLATE:2,FERRITIN:2,TIBC:2,IRON:2,RETICCTPCT:2  in the last 72 hours  Coagulation profile  Lab 05/10/11 0330  INR 1.16  PROTIME --    No results found for this basename: DDIMER:2 in the last 72 hours  Cardiac Enzymes  Lab 05/10/11 1210 05/10/11 0120 05/09/11 1706  CKMB 4.2* 4.6* 4.5*  TROPONINI <0.30 <0.30 <0.30  MYOGLOBIN -- -- --    ------------------------------------------------------------------------------------------------------------------ No components found with this basename: POCBNP:3  Micro Results Recent Results (from the past 240 hour(s))  STOOL CULTURE     Status: Normal   Collection Time   05/07/11 10:22 PM      Component Value Range Status Comment   Specimen Description STOOL   Final    Special Requests NONE   Final    Culture     Final    Value: NO SALMONELLA, SHIGELLA, CAMPYLOBACTER, OR YERSINIA ISOLATED   Report Status 05/11/2011 FINAL   Final   CLOSTRIDIUM DIFFICILE BY PCR     Status: Normal   Collection Time   05/07/11 10:22 PM      Component Value Range Status Comment   C difficile by pcr NEGATIVE  NEGATIVE  Final   MRSA PCR SCREENING     Status: Abnormal   Collection Time   05/08/11  5:04 AM      Component Value Range Status Comment   MRSA by PCR POSITIVE (*) NEGATIVE  Final   CULTURE, BLOOD (ROUTINE X 2)     Status: Normal (Preliminary result)   Collection Time   05/09/11  7:30 AM      Component Value Range Status Comment   Specimen Description BLOOD LEFT ARM   Final    Special Requests BOTTLES DRAWN AEROBIC AND ANAEROBIC 6CC   Final    Setup Time [id number]   Final    Culture     Final    Value:        BLOOD CULTURE RECEIVED NO GROWTH TO DATE CULTURE WILL BE HELD FOR 5 DAYS BEFORE ISSUING A FINAL NEGATIVE REPORT   Report Status PENDING   Incomplete   CULTURE, BLOOD (ROUTINE X 2)     Status: Normal (Preliminary result)   Collection Time   05/09/11  7:40 AM      Component Value Range Status Comment   Specimen Description BLOOD LEFT ARM   Final    Special Requests BOTTLES DRAWN AEROBIC AND ANAEROBIC 6CC   Final    Setup Time [id number]   Final    Culture     Final    Value:        BLOOD CULTURE RECEIVED NO GROWTH TO DATE CULTURE WILL BE HELD FOR 5 DAYS BEFORE ISSUING A FINAL NEGATIVE REPORT   Report Status PENDING   Incomplete     Radiology Reports No results  found.  Scheduled Meds:    . antiseptic oral rinse  15 mL Mouth Rinse q12n4p  . buPROPion  300 mg Oral Daily  . chlorhexidine  15 mL Mouth Rinse BID  . Chlorhexidine Gluconate Cloth  6 each Topical Q0600  . ciprofloxacin  400 mg Intravenous Q12H  . cyclobenzaprine  5 mg Oral TID  . dextromethorphan-guaiFENesin  1 tablet Oral BID  . diltiazem  180 mg Oral Daily  . lansoprazole  30 mg Oral Q1200  . magnesium sulfate 1 - 4 g bolus IVPB  4 g Intravenous Once  . metronidazole  500 mg Intravenous Q8H  . mupirocin ointment  1 application Nasal BID  . nicotine  14 mg  Transdermal Daily  . potassium chloride  40 mEq Oral Q4H  . potassium chloride  40 mEq Oral Once   Continuous Infusions:    . sodium chloride 100 mL/hr at 05/12/11 0827   PRN Meds:.morphine injection, oxyCODONE, promethazine, promethazine, zolpidem  Assessment & Plan   1. Sudden onset of Diarrhea causing dehydration and Hypotension -suspected infectious colitis, per GI  in a pt with recent ABX exposure, but C. Difficile negative ? outpt diagnosis of Norovirus.   -continue cipro-flagyl, IVF, GI following  2. Atrial fibrillation now converted to NSR - May and in normal sinus rhythm overnight R., continue oral Cardizem. per cardiology, due to multiple cardiac stressors-(diarrhea with hypotension, recent pneumonia). Echo shows normal LV function, mild LVH. MAC. Pt went into   Afib- 150s at 2.45 pm on 05-09-11- now NSR post Cardizem IV per Cards, Echo stable, TSH, stable, likely from   3. H/O Hypertension - Controlled on current meds.  4. AOCD + Dilutional - hemoglobin stable status post transfusion, awaiting R. Hemoccult. post >7 lits IVF, pending Occ blood , check Anemia panel, 1 unit transfusion started last night as  H&H fell <7 post IVF 2lit bolus..   5. Pure hypercholesterolemia - home meds when able to tolerate PO.   6.Chronic back pain and spasms - pain control, on flexeril will continue.   7. H/O Recent PNA  - some atelectasis on CXR , add IS, up in chair.   8. Elevated Transaminases - no RUQ  Tenderness - stable trending to NML.   9. Acute renal insufficiency - from azotemia due to #1, creatinine normalized.   10. Hypomagnesemia and-  unchanged, we'll again  replace    11. Hyponatremia - continue hydration, follow and recheck  -Ur Sodium <10 showing pre renal azotemia despite improved creatinine and IVF ( NS about 6-7 lits so far).   12. Hypokalemia- improving, replace k .   DVT Prophylaxis  SCD  Pt remaining hemodynamically stable, will transfer to [facility] M.D on 05/13/2011 at 1:34 PM  [facility] Office  [phone]

## 2011-05-14 LAB — CBC
HCT: 25.4 % — ABNORMAL LOW (ref 36.0–46.0)
MCH: 31.8 pg (ref 26.0–34.0)
MCV: 96.2 fL (ref 78.0–100.0)
RBC: 2.64 MIL/uL — ABNORMAL LOW (ref 3.87–5.11)
WBC: 8.5 10*3/uL (ref 4.0–10.5)

## 2011-05-14 LAB — URINE CULTURE
Colony Count: 10000
Special Requests: NORMAL

## 2011-05-14 LAB — MAGNESIUM: Magnesium: 1.1 mg/dL — ABNORMAL LOW (ref 1.5–2.5)

## 2011-05-14 LAB — COMPREHENSIVE METABOLIC PANEL
BUN: 3 mg/dL — ABNORMAL LOW (ref 6–23)
CO2: 22 mEq/L (ref 19–32)
Chloride: 97 mEq/L (ref 96–112)
Creatinine, Ser: 0.6 mg/dL (ref 0.50–1.10)
GFR calc Af Amer: >90 mL/min (ref >90)
GFR calc non Af Amer: >90 mL/min (ref >90)
Glucose, Bld: 93 mg/dL (ref 70–99)
Total Bilirubin: 0.3 mg/dL (ref 0.3–1.2)

## 2011-05-14 LAB — TYPE AND SCREEN
Antibody Screen: NEGATIVE
Unit division: 0

## 2011-05-14 MED ORDER — MAGNESIUM SULFATE 40 MG/ML IJ SOLN
4.0000 g | Freq: Once | INTRAMUSCULAR | Status: AC
  Administered 2011-05-14: 2 g via INTRAVENOUS
  Filled 2011-05-14 (×2): qty 100

## 2011-05-14 MED ORDER — POTASSIUM CHLORIDE CRYS ER 20 MEQ PO TBCR
40.0000 meq | EXTENDED_RELEASE_TABLET | ORAL | Status: AC
  Administered 2011-05-14 – 2011-05-15 (×2): 40 meq via ORAL
  Filled 2011-05-14 (×3): qty 2

## 2011-05-14 NOTE — Progress Notes (Signed)
[name] 1:10 PM  Subjective: The patient is doing better but still only wants clear liquids but her diarrhea is better although she is still having some abdominal discomfort and no new complaints  Objective: Vital signs stable afebrile labs stable abdomen is sore throughout without guarding or rebound  Assessment: Infectious colitis probably secondary to pseudomembranous colitis with her history of recent antibiotic despite negative C. difficile  Plan: Consider stopping Cipro since it's been one week but continue Flagyl another week in case this is pseudomembranous colitis and will check on tomorrow and consider a flexible sigmoidoscopy or colonoscopy at some point unless diet can be advanced and patient discharged in which case we can see her back as an outpatient  [facility] E

## 2011-05-14 NOTE — Progress Notes (Signed)
[name] CSN:[id number],[id number] is a 61 y.o. female,  Outpatient Primary MD for the patient is No primary provider on file.  Chief Complaint  Patient presents with  . Abdominal Pain  . Hypotension  . Diarrhea        Subjective:    no new c/o, only tolerating liquids still. Objective:   Filed Vitals:   05/13/11 2100 05/14/11 0150 05/14/11 0613 05/14/11 1320  BP: 171/87 165/84 149/82 136/80  Pulse: 98 90 97 100  Temp: 98.6 F (37 C) 98.3 F (36.8 C) 97.7 F (36.5 C) 98.7 F (37.1 C)  TempSrc: Oral Oral Oral Oral  Resp: 18 16 20 20   Height:      Weight:      SpO2: 90% 93% 93% 96%    Wt Readings from Last 3 Encounters:  05/09/11 94.9 kg (209 lb 3.5 oz)     Intake/Output Summary (Last 24 hours) at 05/14/11 1727 Last data filed at 05/14/11 0500  Gross per 24 hour  Intake   2400 ml  Output      0 ml  Net   2400 ml    Exam Awake Alert, Oriented *3, No new F.N deficits, Normal affect HEENT: [place].AT,PERRAL Neck: Supple Neck,No JVD, No cervical lymphadenopathy appriciated.  Lungs: Clear, decreased breath sounds at bases no crackles and no wheezes CARDIAC:RRR,No Gallops,Rubs or new Murmurs, No Parasternal Heave Abdomen: +ve B.Sounds, soft, mild lower abdominal tenderness , No organomegaly appreciated, No rebound -guarding or rigidity. Extremities: No Cyanosis, Clubbing or edema.      Data Review  CBC  Lab 05/14/11 0623 05/13/11 0510 05/12/11 0630 05/11/11 0805 05/10/11 1210 05/10/11 0120 05/07/11 2045  WBC 8.5 10.5 7.7 10.3 -- 16.2* --  HGB 8.4* 9.1* 8.7* 8.2* 8.0* -- --  HCT 25.4* 26.1* 25.4* 23.9* 23.1* -- --  PLT 333 377 322 359 -- 347 --  MCV 96.2 95.3 93.4 95.6 -- 98.5 --  MCH 31.8 33.2 32.0 32.8 -- 33.2 --  MCHC 33.1 34.9 34.3 34.3 -- 33.7 --  RDW 17.2* 17.2* 16.9* 17.1* -- 16.7* --  LYMPHSABS -- -- -- -- -- -- 1.5  MONOABS -- -- -- -- -- -- 2.5*  EOSABS -- -- -- -- -- -- 0.0  BASOSABS -- -- -- -- -- -- 0.0  BANDABS -- -- -- -- -- -- --     Chemistries   Lab 05/14/11 0623 05/13/11 0510 05/12/11 0630 05/11/11 0805 05/10/11 1210 05/10/11 0120  NA 130* 129* 132* 127* 128* --  K 3.1* 3.4* 2.9* 2.9* 3.3* --  CL 97 96 100 97 100 --  CO2 22 20 20 19  16* --  GLUCOSE 93 108* 89 88 93 --  BUN 3* 4* 5* 8 15 --  CREATININE 0.60 0.60 0.58 0.68 0.80 --  CALCIUM 7.3* 7.4* 7.5* 8.0* 7.5* --  MG 1.1* 1.0* 1.0* 1.0* 1.7 --  AST 17 16 17 17  -- 17  ALT 14 16 16 19  -- 18  ALKPHOS 127* 136* 143* 150* -- 129*  BILITOT 0.3 0.3 0.3 0.4 -- 0.3   ------------------------------------------------------------------------------------------------------------------ estimated creatinine clearance is 84.6 ml/min (by C-G formula based on Cr of 0.6). ------------------------------------------------------------------------------------------------------------------ No results found for this basename: HGBA1C:2 in the last 72 hours ------------------------------------------------------------------------------------------------------------------ No results found for this basename: CHOL:2,HDL:2,LDLCALC:2,TRIG:2,CHOLHDL:2,LDLDIRECT:2 in the last 72 hours ------------------------------------------------------------------------------------------------------------------ No results found for this basename: TSH,T4TOTAL,FREET3,T3FREE,THYROIDAB in the last 72 hours ------------------------------------------------------------------------------------------------------------------ No results found for this basename: VITAMINB12:2,FOLATE:2,FERRITIN:2,TIBC:2,IRON:2,RETICCTPCT:2 in the last 72 hours  Coagulation profile  Lab  05/10/11 0330  INR 1.16  PROTIME --    No results found for this basename: DDIMER:2 in the last 72 hours  Cardiac Enzymes  Lab 05/10/11 1210 05/10/11 0120 05/09/11 1706  CKMB 4.2* 4.6* 4.5*  TROPONINI <0.30 <0.30 <0.30  MYOGLOBIN -- -- --    ------------------------------------------------------------------------------------------------------------------ No components found with this basename: POCBNP:3  Micro Results Recent Results (from the past 240 hour(s))  STOOL CULTURE     Status: Normal   Collection Time   05/07/11 10:22 PM      Component Value Range Status Comment   Specimen Description STOOL   Final    Special Requests NONE   Final    Culture     Final    Value: NO SALMONELLA, SHIGELLA, CAMPYLOBACTER, OR YERSINIA ISOLATED   Report Status 05/11/2011 FINAL   Final   CLOSTRIDIUM DIFFICILE BY PCR     Status: Normal   Collection Time   05/07/11 10:22 PM      Component Value Range Status Comment   C difficile by pcr NEGATIVE  NEGATIVE  Final   MRSA PCR SCREENING     Status: Abnormal   Collection Time   05/08/11  5:04 AM      Component Value Range Status Comment   MRSA by PCR POSITIVE (*) NEGATIVE  Final   CULTURE, BLOOD (ROUTINE X 2)     Status: Normal (Preliminary result)   Collection Time   05/09/11  7:30 AM      Component Value Range Status Comment   Specimen Description BLOOD LEFT ARM   Final    Special Requests BOTTLES DRAWN AEROBIC AND ANAEROBIC 6CC   Final    Setup Time [id number]   Final    Culture     Final    Value:        BLOOD CULTURE RECEIVED NO GROWTH TO DATE CULTURE WILL BE HELD FOR 5 DAYS BEFORE ISSUING A FINAL NEGATIVE REPORT   Report Status PENDING   Incomplete   CULTURE, BLOOD (ROUTINE X 2)     Status: Normal (Preliminary result)   Collection Time   05/09/11  7:40 AM      Component Value Range Status Comment   Specimen Description BLOOD LEFT ARM   Final    Special Requests BOTTLES DRAWN AEROBIC AND ANAEROBIC 6CC   Final    Setup Time [id number]   Final    Culture     Final    Value:        BLOOD CULTURE RECEIVED NO GROWTH TO DATE CULTURE WILL BE HELD FOR 5 DAYS BEFORE ISSUING A FINAL NEGATIVE REPORT   Report Status PENDING   Incomplete   URINE CULTURE     Status: Normal   Collection  Time   05/13/11  3:08 PM      Component Value Range Status Comment   Specimen Description URINE, CLEAN CATCH   Final    Special Requests Normal   Final    Setup Time [id number]   Final    Colony Count 10,000 COLONIES/ML   Final    Culture     Final    Value: Multiple bacterial morphotypes present, none predominant. Suggest appropriate recollection if clinically indicated.   Report Status 05/14/2011 FINAL   Final     Radiology Reports No results found.  Scheduled Meds:    . antiseptic oral rinse  15 mL Mouth Rinse q12n4p  . buPROPion  300 mg Oral Daily  . chlorhexidine  15  mL Mouth Rinse BID  . Chlorhexidine Gluconate Cloth  6 each Topical Q0600  . cyclobenzaprine  5 mg Oral TID  . dextromethorphan-guaiFENesin  1 tablet Oral BID  . diltiazem  180 mg Oral Daily  . lansoprazole  30 mg Oral Q1200  . magnesium sulfate 1 - 4 g bolus IVPB  4 g Intravenous Once  . metronidazole  500 mg Intravenous Q8H  . mupirocin ointment  1 application Nasal BID  . nicotine  14 mg Transdermal Daily  . potassium chloride  40 mEq Oral Q4H  . DISCONTD: ciprofloxacin  400 mg Intravenous Q12H   Continuous Infusions:    . sodium chloride 100 mL/hr at 05/14/11 0823   PRN Meds:.morphine injection, oxyCODONE, promethazine, promethazine, zolpidem  Assessment & Plan   1. Sudden onset of Diarrhea causing dehydration and Hypotension -suspected infectious colitis, per GI  in a pt with recent ABX exposure, but C. Difficile negative ? outpt diagnosis of Norovirus.   -will d/c cipro as per GI recs, continue flagyl for week   2. Atrial fibrillation now converted to NSR - continue oral Cardizem, rate controlled. per cardiology, due to multiple cardiac stressors-(diarrhea with hypotension, recent pneumonia). Echo shows normal LV function, mild LVH. MAC. Pt went into   Afib- 150s at 2.45 pm on 05-09-11- now NSR post Cardizem IV per Cards, Echo stable, TSH, stable, likely from   3. H/O Hypertension -  Controlled on current meds.  4. AOCD + Dilutional - hemoglobin stable status post transfusion, awaiting R. Hemoccult. post >7 lits IVF, pending Occ blood , check Anemia panel, 1 unit transfusion started last night as  H&H fell <7 post IVF 2lit bolus..   5. Pure hypercholesterolemia - home meds when able to tolerate PO.   6.Chronic back pain and spasms - pain control, on flexeril will continue.   7. H/O Recent PNA - some atelectasis on CXR , add IS, up in chair.   8. Elevated Transaminases - no RUQ  Tenderness - stable trending to NML.   9. Acute renal insufficiency - from azotemia due to #1, creatinine normalized.   10. Hypomagnesemia and-  No significant response to supplemental mg so far, replete and follow.    11. Hyponatremia - continue hydration, follow and recheck  -Ur Sodium <10 showing pre renal azotemia despite improved creatinine and IVF ( NS about 6-7 lits so far).   12. Hypokalemia-  again replace k .   DVT Prophylaxis  SCD  Pt remaining hemodynamically stable, will transfer to [facility] C M.D on 05/14/2011 at 5:27 PM  [facility] Office  [phone]

## 2011-05-15 LAB — COMPREHENSIVE METABOLIC PANEL
AST: 18 U/L (ref 0–37)
Albumin: 2.5 g/dL — ABNORMAL LOW (ref 3.5–5.2)
Chloride: 98 mEq/L (ref 96–112)
Creatinine, Ser: 0.57 mg/dL (ref 0.50–1.10)
Potassium: 3.4 mEq/L — ABNORMAL LOW (ref 3.5–5.1)
Sodium: 130 mEq/L — ABNORMAL LOW (ref 135–145)
Total Bilirubin: 0.3 mg/dL (ref 0.3–1.2)

## 2011-05-15 LAB — CULTURE, BLOOD (ROUTINE X 2)
Culture  Setup Time: 201301211204
Culture: NO GROWTH

## 2011-05-15 LAB — CBC
MCV: 94.2 fL (ref 78.0–100.0)
Platelets: 356 10*3/uL (ref 150–400)
RDW: 16.7 % — ABNORMAL HIGH (ref 11.5–15.5)
WBC: 8.4 10*3/uL (ref 4.0–10.5)

## 2011-05-15 MED ORDER — POTASSIUM CHLORIDE 10 MEQ/100ML IV SOLN
10.0000 meq | INTRAVENOUS | Status: AC
  Administered 2011-05-15 – 2011-05-16 (×2): 10 meq via INTRAVENOUS
  Filled 2011-05-15 (×2): qty 100

## 2011-05-15 MED ORDER — POLYETHYLENE GLYCOL 3350 17 G PO PACK
17.0000 g | PACK | ORAL | Status: AC
  Administered 2011-05-15 (×3): 17 g via ORAL
  Filled 2011-05-15 (×3): qty 1

## 2011-05-15 MED ORDER — POTASSIUM CHLORIDE CRYS ER 20 MEQ PO TBCR
40.0000 meq | EXTENDED_RELEASE_TABLET | Freq: Once | ORAL | Status: AC
  Administered 2011-05-15: 40 meq via ORAL
  Filled 2011-05-15: qty 2

## 2011-05-15 MED ORDER — MAGNESIUM CITRATE PO SOLN
1.0000 | Freq: Once | ORAL | Status: AC
  Administered 2011-05-15: 1 via ORAL

## 2011-05-15 NOTE — Progress Notes (Signed)
[name] 12:32 PM  Subjective: The patient believe she's doing a little better but other than a smoothy she has been eating anything except for clear liquids. She has multiple arthritic complaints and her abdomen is less tender and she has not had any diarrhea today and no new complaint except increased pedal edema Objective: Vital signs stable afebrile no acute distress abdomen is soft or less tender hemoglobin stable however consider transfusion  Assessment: Improved overall  Plan: Since she had problems with her colonoscopy prep at home it would probably be reasonable to proceed before discharge and after our prolonged conversation about it she likes the idea of proceeding on Tuesday using propofol and we will try to get her on the schedule tomorrow for that in the meantime we will continue clear liquid and use a very gentle prep on Monday  [facility] E

## 2011-05-15 NOTE — Progress Notes (Signed)
[name] CSN:[id number],MRN:[id number] is a 60 y.o. female,  Outpatient Primary MD for the patient is No primary provider on file.  Chief Complaint  Patient presents with  . Abdominal Pain  . Hypotension  . Diarrhea        Subjective:    still only able to tolerate liquids, c/o edema in legs. Objective:   Filed Vitals:   05/14/11 2125 05/15/11 0235 05/15/11 0645 05/15/11 1040  BP: 141/78 128/75 113/98 131/78  Pulse: 98 96 87 89  Temp: 98.5 F (36.9 C) 98.5 F (36.9 C) 97.9 F (36.6 C) 97.8 F (36.6 C)  TempSrc: Oral Oral Oral Oral  Resp: 16 18 16 18   Height:      Weight:      SpO2: 95% 96% 92%     Wt Readings from Last 3 Encounters:  05/09/11 94.9 kg (209 lb 3.5 oz)     Intake/Output Summary (Last 24 hours) at 05/15/11 1631 Last data filed at 05/15/11 0900  Gross per 24 hour  Intake   1780 ml  Output   1450 ml  Net    330 ml    Exam Awake Alert, Oriented *3, No new F.N deficits, Normal affect HEENT: [place].AT,PERRAL Neck: Supple Neck,No JVD, No cervical lymphadenopathy appriciated.  Lungs: Clear, decreased breath sounds at bases no crackles and no wheezes CARDIAC:RRR,No Gallops,Rubs or new Murmurs, No Parasternal Heave Abdomen: +ve B.Sounds, soft, minimal lower abdominal tenderness , No organomegaly appreciated, No rebound -guarding or rigidity. Extremities: No Cyanosis, Clubbing or edema.      Data Review  CBC  Lab 05/15/11 0607 05/14/11 0623 05/13/11 0510 05/12/11 0630 05/11/11 0805  WBC 8.4 8.5 10.5 7.7 10.3  HGB 8.4* 8.4* 9.1* 8.7* 8.2*  HCT 24.3* 25.4* 26.1* 25.4* 23.9*  PLT 356 333 377 322 359  MCV 94.2 96.2 95.3 93.4 95.6  MCH 32.6 31.8 33.2 32.0 32.8  MCHC 34.6 33.1 34.9 34.3 34.3  RDW 16.7* 17.2* 17.2* 16.9* 17.1*  LYMPHSABS -- -- -- -- --  MONOABS -- -- -- -- --  EOSABS -- -- -- -- --  BASOSABS -- -- -- -- --  BANDABS -- -- -- -- --    Chemistries   Lab 05/15/11 0607 05/14/11 0623 05/13/11 0510 05/12/11 0630 05/11/11 0805  NA 130* 130*  129* 132* 127*  K 3.4* 3.1* 3.4* 2.9* 2.9*  CL 98 97 96 100 97  CO2 22 22 20 20 19   GLUCOSE 98 93 108* 89 88  BUN <3* 3* 4* 5* 8  CREATININE 0.57 0.60 0.60 0.58 0.68  CALCIUM 7.3* 7.3* 7.4* 7.5* 8.0*  MG 1.5 1.1* 1.0* 1.0* 1.0*  AST 18 17 16 17 17   ALT 14 14 16 16 19   ALKPHOS 141* 127* 136* 143* 150*  BILITOT 0.3 0.3 0.3 0.3 0.4   ------------------------------------------------------------------------------------------------------------------ estimated creatinine clearance is 84.6 ml/min (by C-G formula based on Cr of 0.57). ------------------------------------------------------------------------------------------------------------------ No results found for this basename: HGBA1C:2 in the last 72 hours ------------------------------------------------------------------------------------------------------------------ No results found for this basename: CHOL:2,HDL:2,LDLCALC:2,TRIG:2,CHOLHDL:2,LDLDIRECT:2 in the last 72 hours ------------------------------------------------------------------------------------------------------------------ No results found for this basename: TSH,T4TOTAL,FREET3,T3FREE,THYROIDAB in the last 72 hours ------------------------------------------------------------------------------------------------------------------ No results found for this basename: VITAMINB12:2,FOLATE:2,FERRITIN:2,TIBC:2,IRON:2,RETICCTPCT:2 in the last 72 hours  Coagulation profile  Lab 05/10/11 0330  INR 1.16  PROTIME --    No results found for this basename: DDIMER:2 in the last 72 hours  Cardiac Enzymes  Lab 05/10/11 1210 05/10/11 0120 05/09/11 1706  CKMB 4.2* 4.6* 4.5*  TROPONINI <0.30 <0.30 <0.30  MYOGLOBIN -- -- --   ------------------------------------------------------------------------------------------------------------------ No components found with this basename: POCBNP:3  Micro Results Recent Results (from the past 240 hour(s))  STOOL CULTURE     Status: Normal    Collection Time   05/07/11 10:22 PM      Component Value Range Status Comment   Specimen Description STOOL   Final    Special Requests NONE   Final    Culture     Final    Value: NO SALMONELLA, SHIGELLA, CAMPYLOBACTER, OR YERSINIA ISOLATED   Report Status 05/11/2011 FINAL   Final   CLOSTRIDIUM DIFFICILE BY PCR     Status: Normal   Collection Time   05/07/11 10:22 PM      Component Value Range Status Comment   C difficile by pcr NEGATIVE  NEGATIVE  Final   MRSA PCR SCREENING     Status: Abnormal   Collection Time   05/08/11  5:04 AM      Component Value Range Status Comment   MRSA by PCR POSITIVE (*) NEGATIVE  Final   CULTURE, BLOOD (ROUTINE X 2)     Status: Normal   Collection Time   05/09/11  7:30 AM      Component Value Range Status Comment   Specimen Description BLOOD LEFT ARM   Final    Special Requests BOTTLES DRAWN AEROBIC AND ANAEROBIC [facility]   Final    Setup Time [id number]   Final    Culture NO GROWTH 5 DAYS   Final    Report Status 05/15/2011 FINAL   Final   CULTURE, BLOOD (ROUTINE X 2)     Status: Normal   Collection Time   05/09/11  7:40 AM      Component Value Range Status Comment   Specimen Description BLOOD LEFT ARM   Final    Special Requests BOTTLES DRAWN AEROBIC AND ANAEROBIC [facility]   Final    Setup Time [id number]   Final    Culture NO GROWTH 5 DAYS   Final    Report Status 05/15/2011 FINAL   Final   URINE CULTURE     Status: Normal   Collection Time   05/13/11  3:08 PM      Component Value Range Status Comment   Specimen Description URINE, CLEAN CATCH   Final    Special Requests Normal   Final    Setup Time [id number]   Final    Colony Count 10,000 COLONIES/ML   Final    Culture     Final    Value: Multiple bacterial morphotypes present, none predominant. Suggest appropriate recollection if clinically indicated.   Report Status 05/14/2011 FINAL   Final     Radiology Reports No results found.  Scheduled Meds:    . antiseptic oral rinse  15 mL Mouth  Rinse q12n4p  . buPROPion  300 mg Oral Daily  . chlorhexidine  15 mL Mouth Rinse BID  . cyclobenzaprine  5 mg Oral TID  . dextromethorphan-guaiFENesin  1 tablet Oral BID  . diltiazem  180 mg Oral Daily  . lansoprazole  30 mg Oral Q1200  . magnesium citrate  1 Bottle Oral Once  . magnesium sulfate 1 - 4 g bolus IVPB  4 g Intravenous Once  . metronidazole  500 mg Intravenous Q8H  . nicotine  14 mg Transdermal Daily  . polyethylene glycol  17 g Oral Q1 Hr x 3  . potassium chloride  40 mEq Oral Q4H  . DISCONTD: ciprofloxacin  400  mg Intravenous Q12H   Continuous Infusions:    . sodium chloride 20 mL/hr at 05/15/11 1306   PRN Meds:.morphine injection, oxyCODONE, promethazine, promethazine, zolpidem  Assessment & Plan   1. Sudden onset of Diarrhea causing dehydration and Hypotension -suspected infectious colitis, per GI  in a pt with recent ABX exposure, but C. Difficile negative ? outpt diagnosis of Norovirus.   -continue flagyl, ENDO in am per GI ,follow    2. Atrial fibrillation now converted to NSR - continue oral Cardizem, rate controlled. per cardiology, due to multiple cardiac stressors-(diarrhea with hypotension, recent pneumonia). Echo shows normal LV function, mild LVH. MAC. Pt went into   Afib- 150s at 2.45 pm on 05-09-11- now NSR post Cardizem IV per Cards, Echo stable, TSH, stable, likely from   3. H/O Hypertension - Controlled on current meds.  4. AOCD + Dilutional - hemoglobin stable status post transfusion, post >7 lits IVF, pending Occ blood , check Anemia panel, 1 unit transfusion started last night as  H&H fell <7 post IVF 2lit bolus.. Endoscopy in am per GI   5. Pure hypercholesterolemia - home meds when able to tolerate PO.   6.Chronic back pain and spasms - pain control, on flexeril will continue.   7. H/O Recent PNA - some atelectasis on CXR , add IS, up in chair.   8. Elevated Transaminases - no RUQ  Tenderness - stable trending to NML.   9. Acute  renal insufficiency - from azotemia due to #1, creatinine normalized.   10. Hypomagnesemia and-  now resolved  11. Hyponatremia - follow and recheck , ivf decreased due peripheral edema, follow  12. Hypokalemia-  again replace k    .   DVT Prophylaxis  SCD        [name] C M.D on 05/15/2011 at 4:31 PM  [facility] Office  [phone]

## 2011-05-16 ENCOUNTER — Encounter (HOSPITAL_COMMUNITY): Payer: Self-pay

## 2011-05-16 ENCOUNTER — Other Ambulatory Visit: Payer: Self-pay | Admitting: Gastroenterology

## 2011-05-16 ENCOUNTER — Encounter (HOSPITAL_COMMUNITY)
Admission: EM | Disposition: A | Discharge status: Home / Self Care | Payer: Self-pay | Source: Home / Self Care | Attending: Internal Medicine

## 2011-05-16 HISTORY — PX: COLONOSCOPY: SHX5424

## 2011-05-16 LAB — COMPREHENSIVE METABOLIC PANEL
Alkaline Phosphatase: 138 U/L — ABNORMAL HIGH (ref 39–117)
BUN: <3 mg/dL — ABNORMAL LOW (ref 6–23)
Chloride: 99 mEq/L (ref 96–112)
GFR calc Af Amer: >90 mL/min (ref >90)
Glucose, Bld: 87 mg/dL (ref 70–99)
Potassium: 3.9 mEq/L (ref 3.5–5.1)
Total Bilirubin: 0.3 mg/dL (ref 0.3–1.2)

## 2011-05-16 LAB — CBC
HCT: 25.6 % — ABNORMAL LOW (ref 36.0–46.0)
Hemoglobin: 8.7 g/dL — ABNORMAL LOW (ref 12.0–15.0)
MCHC: 34 g/dL (ref 30.0–36.0)
WBC: 8 10*3/uL (ref 4.0–10.5)

## 2011-05-16 LAB — MAGNESIUM: Magnesium: 1 mg/dL — ABNORMAL LOW (ref 1.5–2.5)

## 2011-05-16 SURGERY — COLONOSCOPY
Anesthesia: Moderate Sedation

## 2011-05-16 MED ORDER — LORATADINE 10 MG PO TABS
10.0000 mg | ORAL_TABLET | Freq: Every day | ORAL | Status: DC
  Administered 2011-05-16 – 2011-05-20 (×5): 10 mg via ORAL
  Filled 2011-05-16 (×5): qty 1

## 2011-05-16 MED ORDER — PSEUDOEPHEDRINE HCL 30 MG PO TABS
30.0000 mg | ORAL_TABLET | Freq: Every day | ORAL | Status: DC
  Administered 2011-05-16 – 2011-05-20 (×5): 30 mg via ORAL
  Filled 2011-05-16 (×6): qty 1

## 2011-05-16 MED ORDER — DIPHENHYDRAMINE HCL 50 MG/ML IJ SOLN
INTRAMUSCULAR | Status: DC | PRN
  Administered 2011-05-16: 25 mg via INTRAVENOUS

## 2011-05-16 MED ORDER — MIDAZOLAM HCL 5 MG/5ML IJ SOLN
INTRAMUSCULAR | Status: DC | PRN
  Administered 2011-05-16 (×4): 2 mg via INTRAVENOUS

## 2011-05-16 MED ORDER — FENTANYL NICU IV SYRINGE 50 MCG/ML
INJECTION | INTRAMUSCULAR | Status: DC | PRN
  Administered 2011-05-16 (×4): 25 ug via INTRAVENOUS

## 2011-05-16 NOTE — Progress Notes (Signed)
[name] CSN:[id number],[id number] is a 61 y.o. female,  Outpatient Primary MD for the patient is No primary provider on file.  Chief Complaint  Patient presents with  . Abdominal Pain  . Hypotension  . Diarrhea        Subjective:   Status post colonoscopy, complaining of bloating. Still minimal by mouth was-only eaten Jell-O today. Objective:   Filed Vitals:   05/16/11 0940 05/16/11 0950 05/16/11 1020 05/16/11 1400  BP: 136/92 157/93 150/80 136/84  Pulse:   96 91  Temp:   98.3 F (36.8 C) 98 F (36.7 C)  TempSrc:   Oral Oral  Resp: 71 26 20 18   Height:      Weight:      SpO2: 74% 93% 90% 93%    Wt Readings from Last 3 Encounters:  05/09/11 94.9 kg (209 lb 3.5 oz)  05/09/11 94.9 kg (209 lb 3.5 oz)     Intake/Output Summary (Last 24 hours) at 05/16/11 1741 Last data filed at 05/16/11 1400  Gross per 24 hour  Intake   1988 ml  Output    250 ml  Net   1738 ml    Exam Awake Alert, Oriented *3, No new F.N deficits, Normal affect HEENT: [place].AT,PERRAL Neck: Supple Neck,No JVD, No cervical lymphadenopathy appriciated.  Lungs: Clear, decreased breath sounds at bases no crackles and no wheezes CARDIAC:RRR,No Gallops,Rubs or new Murmurs, No Parasternal Heave Abdomen: +ve B.Sounds, soft, minima tenderness , No organomegaly appreciated, No rebound -guarding or rigidity. Extremities: No Cyanosis, trace to +1 edema.      Data Review  CBC  Lab 05/16/11 0506 05/15/11 0607 05/14/11 0623 05/13/11 0510 05/12/11 0630  WBC 8.0 8.4 8.5 10.5 7.7  HGB 8.7* 8.4* 8.4* 9.1* 8.7*  HCT 25.6* 24.3* 25.4* 26.1* 25.4*  PLT 346 356 333 377 322  MCV 95.9 94.2 96.2 95.3 93.4  MCH 32.6 32.6 31.8 33.2 32.0  MCHC 34.0 34.6 33.1 34.9 34.3  RDW 16.9* 16.7* 17.2* 17.2* 16.9*  LYMPHSABS -- -- -- -- --  MONOABS -- -- -- -- --  EOSABS -- -- -- -- --  BASOSABS -- -- -- -- --  BANDABS -- -- -- -- --    Chemistries   Lab 05/16/11 0506 05/15/11 0607 05/14/11 0623 05/13/11 0510 05/12/11 0630   NA 133* 130* 130* 129* 132*  K 3.9 3.4* 3.1* 3.4* 2.9*  CL 99 98 97 96 100  CO2 23 22 22 20 20   GLUCOSE 87 98 93 108* 89  BUN <3* <3* 3* 4* 5*  CREATININE 0.58 0.57 0.60 0.60 0.58  CALCIUM 7.7* 7.3* 7.3* 7.4* 7.5*  MG 1.0* 1.5 1.1* 1.0* 1.0*  AST 17 18 17 16 17   ALT 14 14 14 16 16   ALKPHOS 138* 141* 127* 136* 143*  BILITOT 0.3 0.3 0.3 0.3 0.3   ------------------------------------------------------------------------------------------------------------------ estimated creatinine clearance is 84.6 ml/min (by C-G formula based on Cr of 0.58). ------------------------------------------------------------------------------------------------------------------ No results found for this basename: HGBA1C:2 in the last 72 hours ------------------------------------------------------------------------------------------------------------------ No results found for this basename: CHOL:2,HDL:2,LDLCALC:2,TRIG:2,CHOLHDL:2,LDLDIRECT:2 in the last 72 hours ------------------------------------------------------------------------------------------------------------------ No results found for this basename: TSH,T4TOTAL,FREET3,T3FREE,THYROIDAB in the last 72 hours ------------------------------------------------------------------------------------------------------------------ No results found for this basename: VITAMINB12:2,FOLATE:2,FERRITIN:2,TIBC:2,IRON:2,RETICCTPCT:2 in the last 72 hours  Coagulation profile  Lab 05/10/11 0330  INR 1.16  PROTIME --    No results found for this basename: DDIMER:2 in the last 72 hours  Cardiac Enzymes  Lab 05/10/11 1210 05/10/11 0120  CKMB 4.2* 4.6*  TROPONINI <0.30 <0.30  MYOGLOBIN -- --   ------------------------------------------------------------------------------------------------------------------ No components found with this basename: POCBNP:3  Micro Results Recent Results (from the past 240 hour(s))  STOOL CULTURE     Status: Normal   Collection  Time   05/07/11 10:22 PM      Component Value Range Status Comment   Specimen Description STOOL   Final    Special Requests NONE   Final    Culture     Final    Value: NO SALMONELLA, SHIGELLA, CAMPYLOBACTER, OR YERSINIA ISOLATED   Report Status 05/11/2011 FINAL   Final   CLOSTRIDIUM DIFFICILE BY PCR     Status: Normal   Collection Time   05/07/11 10:22 PM      Component Value Range Status Comment   C difficile by pcr NEGATIVE  NEGATIVE  Final   MRSA PCR SCREENING     Status: Abnormal   Collection Time   05/08/11  5:04 AM      Component Value Range Status Comment   MRSA by PCR POSITIVE (*) NEGATIVE  Final   CULTURE, BLOOD (ROUTINE X 2)     Status: Normal   Collection Time   05/09/11  7:30 AM      Component Value Range Status Comment   Specimen Description BLOOD LEFT ARM   Final    Special Requests BOTTLES DRAWN AEROBIC AND ANAEROBIC [facility]   Final    Culture  Setup Time [id number]   Final    Culture NO GROWTH 5 DAYS   Final    Report Status 05/15/2011 FINAL   Final   CULTURE, BLOOD (ROUTINE X 2)     Status: Normal   Collection Time   05/09/11  7:40 AM      Component Value Range Status Comment   Specimen Description BLOOD LEFT ARM   Final    Special Requests BOTTLES DRAWN AEROBIC AND ANAEROBIC [facility]   Final    Culture  Setup Time [id number]   Final    Culture NO GROWTH 5 DAYS   Final    Report Status 05/15/2011 FINAL   Final   URINE CULTURE     Status: Normal   Collection Time   05/13/11  3:08 PM      Component Value Range Status Comment   Specimen Description URINE, CLEAN CATCH   Final    Special Requests Normal   Final    Culture  Setup Time [id number]   Final    Colony Count 10,000 COLONIES/ML   Final    Culture     Final    Value: Multiple bacterial morphotypes present, none predominant. Suggest appropriate recollection if clinically indicated.   Report Status 05/14/2011 FINAL   Final     Radiology Reports No results found.  Scheduled Meds:    . antiseptic oral  rinse  15 mL Mouth Rinse q12n4p  . buPROPion  300 mg Oral Daily  . chlorhexidine  15 mL Mouth Rinse BID  . cyclobenzaprine  5 mg Oral TID  . dextromethorphan-guaiFENesin  1 tablet Oral BID  . diltiazem  180 mg Oral Daily  . lansoprazole  30 mg Oral Q1200  . metronidazole  500 mg Intravenous Q8H  . nicotine  14 mg Transdermal Daily  . polyethylene glycol  17 g Oral Q1 Hr x 3  . potassium chloride  10 mEq Intravenous Q1 Hr x 2  . potassium chloride  40 mEq Oral Once   Continuous Infusions:    . sodium chloride 20 mL/hr at  05/16/11 1720   PRN Meds:.morphine injection, oxyCODONE, promethazine, promethazine, zolpidem, DISCONTD: diphenhydrAMINE, DISCONTD: fentaNYL, DISCONTD: midazolam  Assessment & Plan   1. Sudden onset of Diarrhea causing dehydration and Hypotension -status post colonoscopy and per Dr. Randa Evens possibly ischemic colitis, will await biopsies.  -continue flagyl, appreciate GI assistance.   2. Atrial fibrillation now converted to NSR - continue oral Cardizem, rate controlled. per cardiology, due to multiple cardiac stressors-(diarrhea with hypotension, recent pneumonia). Echo shows normal LV function, mild LVH. MAC. Pt went into   Afib- 150s at 2.45 pm on 05-09-11- now NSR post Cardizem IV per Cards, Echo stable, TSH, stable, likely from   3. H/O Hypertension - Controlled on current meds.  4. AOCD + Dilutional - hemoglobin stable status post transfusion,  post >7 lits IVF, 1 unit transfusion started last night as  H&H fell <7 post IVF 2lit bolus..    5. Pure hypercholesterolemia - home meds when able to tolerate PO.   6.Chronic back pain and spasms - pain control, on flexeril will continue.   7. H/O Recent PNA - some atelectasis on CXR , add IS, up in chair.   8. Elevated Transaminases - no RUQ  Tenderness - stable trending to NML.   9. Acute renal insufficiency - from azotemia due to #1, creatinine normalized.   10. Hypomagnesemia and-  now resolved  11.  Hyponatremia - improving.  12. Hypokalemia-  resolved   13 perennial  allergies- will place on Claritin-D as per her request. .   DVT Prophylaxis  SCD        Gurveer Colucci C M.D on 05/16/2011 at 5:41 PM  [facility] Office  [phone]

## 2011-05-16 NOTE — Op Note (Signed)
[facility] [address] [place], [place]  [address]  OPERATIVE PROCEDURE REPORT  PATIENT:  Kari, Sanchez  MR#:  [id number] BIRTHDATE:  21-Jan-1951  GENDER:  female ENDOSCOPIST:  Carman Ching ASSISTANT:  PROCEDURE DATE:  05/16/2011 PRE-PROCEDURE PREPERATION:  PRE-PROCEDURE PHYSICAL: PROCEDURE:  Colonoscopy with biopsy ASA CLASS:  Class II INDICATIONS:  1) blood in stool  2) unexplained diarrhea patient admitted with diarrhea C. difficile negative, rectal bleeding. MEDICATIONS:  None  DESCRIPTION OF PROCEDURE:   After the risks, benefits, and alternatives of the procedure were thoroughly explained [including a 10% missed rate of cancer and polyps], informed consent was obtained.  Digital rectal exam was performed.  The Pentax Colonoscope [id number] was introduced through the anus and advanced to the cecum, limited by poor preparation.    The quality of the prep was poor.. Multiple washes were done. Small lesions could be missed. The instrument was then slowly withdrawn as the colon was fully examined. <<PROCEDUREIMAGES>>  FINDINGS:  The entire colonic mucosa appeared healthy with a normal vascular pattern.  No masses, polyps, diverticula or AVM's were noted.  The appendiceal orifice and the ICV were identified and photographed.  The terminal ileum appeared normal. Retroflexed views revealed no abnormalities.  The patient tolerated the procedure without immediate complications.  The scope was then withdrawn from the patient and the procedure terminated.  IMPRESSION:  Severe right sided colitis, probably ischeic.  Lt colin is relatively norml. RECOMMENDATIONS:  1) Avoid all NSAIDS for the next 2 weeks. 2) Await biopsy results continue clear liquids and antibiotics  REPEAT EXAM:  No DISCHARGE INSTRUCTIONS:  ______________________________ Carman Ching  CPT CODES:  DIAGNOSIS CODES:  CC:  Feliciana Rossetti, M.D.  n. eSIGNEDCarman Ching at 05/16/2011 09:15  AM  Scheid, [place], [phone]

## 2011-05-16 NOTE — H&P (Signed)
  Admission H+P reviewed 

## 2011-05-16 NOTE — Progress Notes (Signed)
UR completed 

## 2011-05-17 ENCOUNTER — Encounter (HOSPITAL_COMMUNITY): Payer: Self-pay | Admitting: Gastroenterology

## 2011-05-17 LAB — MAGNESIUM: Magnesium: 0.8 mg/dL — CL (ref 1.5–2.5)

## 2011-05-17 LAB — COMPREHENSIVE METABOLIC PANEL
ALT: 12 U/L (ref 0–35)
Albumin: 2.6 g/dL — ABNORMAL LOW (ref 3.5–5.2)
Alkaline Phosphatase: 132 U/L — ABNORMAL HIGH (ref 39–117)
Potassium: 3.3 mEq/L — ABNORMAL LOW (ref 3.5–5.1)
Sodium: 131 mEq/L — ABNORMAL LOW (ref 135–145)
Total Protein: 5.5 g/dL — ABNORMAL LOW (ref 6.0–8.3)

## 2011-05-17 LAB — CBC
MCHC: 34.2 g/dL (ref 30.0–36.0)
Platelets: 383 10*3/uL (ref 150–400)
RDW: 17 % — ABNORMAL HIGH (ref 11.5–15.5)

## 2011-05-17 MED ORDER — POTASSIUM CHLORIDE CRYS ER 20 MEQ PO TBCR
40.0000 meq | EXTENDED_RELEASE_TABLET | ORAL | Status: AC
  Administered 2011-05-17 (×2): 40 meq via ORAL
  Filled 2011-05-17 (×3): qty 2

## 2011-05-17 MED ORDER — MAGNESIUM OXIDE 400 MG PO TABS
400.0000 mg | ORAL_TABLET | Freq: Two times a day (BID) | ORAL | Status: DC
  Administered 2011-05-17 – 2011-05-18 (×3): 400 mg via ORAL
  Filled 2011-05-17 (×4): qty 1

## 2011-05-17 MED ORDER — POTASSIUM CHLORIDE CRYS ER 20 MEQ PO TBCR
20.0000 meq | EXTENDED_RELEASE_TABLET | Freq: Every day | ORAL | Status: DC
  Administered 2011-05-17 – 2011-05-20 (×4): 20 meq via ORAL
  Filled 2011-05-17 (×4): qty 1

## 2011-05-17 NOTE — Progress Notes (Signed)
CRITICAL VALUE ALERT  Critical value received:  Magnesium 0.8  Date of notification:  05/17/2011   Time of notification:  6:26 AM   Critical value read back:yes  Nurse who received alert:  [email or web address]  MD notified (1st page):  Gwinda Passe, NP  Time of first page:  [phone]  MD notified (2nd page):  Time of second page:  Responding MD:  Gwinda Passe, NP  Time MD responded:  [phone]

## 2011-05-17 NOTE — Progress Notes (Signed)
[facility] PROGRESS NOTE Subjective Pain is less, no BM, + flatus  Objective: Vital signs in last 24 hours: Temp:  [98 F (36.7 C)-98.8 F (37.1 C)] 98.5 F (36.9 C) (01/29 0615) Pulse Rate:  [81-104] 94  (01/29 0615) Resp:  [13-71] 18  (01/29 0615) BP: (133-157)/(78-93) 134/78 mmHg (01/29 0615) SpO2:  [74 %-100 %] 94 % (01/29 0615) Last BM Date: 05/16/11  Intake/Output from previous day: 01/28 0701 - 01/29 0700 In: 2247 [P.O.:960; I.V.:1287] Out: 250 [Urine:250] Intake/Output this shift: Total I/O In: 240 [P.O.:240] Out: -   PE:Abd-mild tenderness, diffuse, good BSs  Lab Results:  [facility] 05/17/11 0445 05/16/11 0506 05/15/11 0607  WBC 9.9 8.0 8.4  HGB 9.0* 8.7* 8.4*  HCT 26.3* 25.6* 24.3*  PLT 383 346 356   BMET  Basename 05/17/11 0445 05/16/11 0506 05/15/11 0607  NA 131* 133* 130*  K 3.3* 3.9 3.4*  CL 96 99 98  CO2 21 23 22   CREATININE 0.57 0.58 0.57   LFT  Basename 05/17/11 0445 05/16/11 0506 05/15/11 0607  PROT 5.5* 5.5* 5.4*  AST 18 17 18   ALT 12 14 14   ALKPHOS 132* 138* 141*  BILITOT 0.4 0.3 0.3  BILIDIR -- -- --  IBILI -- -- --   PT/INR No results found for this basename: LABPROT:3,INR:3 in the last 72 hours PANCREAS No results found for this basename: LIPASE:3 in the last 72 hours       Studies/Results: No results found.  Medications: I have reviewed the patient's current medications.  Assessment/Plan: 1. Acute colitis.  Ischemic vs infectious. Primarily rt sided.  Stool cultures seem to be pending. Continue CLs and support.   Alen Matheson JR,Elke Holtry L 05/17/2011, 9:15 AM

## 2011-05-17 NOTE — Progress Notes (Signed)
[name] CSN:[id number],MRN:[id number] is a 61 y.o. female,  Outpatient Primary MD for the patient is No primary provider on file.  Chief Complaint  Patient presents with  . Abdominal Pain  . Hypotension  . Diarrhea        Subjective:   States abd. Cramping and bloating about the same, denies any n/v. Reports nasal congestion improved. Objective:   Filed Vitals:   05/16/11 1400 05/16/11 1600 05/16/11 2245 05/17/11 0615  BP: 136/84 133/87 150/83 134/78  Pulse: 91 81 104 94  Temp: 98 F (36.7 C) 98.1 F (36.7 C) 98.8 F (37.1 C) 98.5 F (36.9 C)  TempSrc: Oral Oral Oral Oral  Resp: 18 18 22 18   Height:      Weight:      SpO2: 93% 92% 93% 94%    Wt Readings from Last 3 Encounters:  05/09/11 94.9 kg (209 lb 3.5 oz)  05/09/11 94.9 kg (209 lb 3.5 oz)     Intake/Output Summary (Last 24 hours) at 05/17/11 0758 Last data filed at 05/16/11 1824  Gross per 24 hour  Intake   2247 ml  Output    250 ml  Net   1997 ml    Exam Awake Alert, Oriented *3, No new F.N deficits, Normal affect HEENT: [place].AT,PERRAL Neck: Supple Neck,No JVD, No cervical lymphadenopathy appriciated.  Lungs: Clear, decreased breath sounds at bases no crackles and no wheezes CARDIAC:RRR,No Gallops,Rubs or new Murmurs, No Parasternal Heave Abdomen: +ve B.Sounds, soft, minimal tenderness , No organomegaly appreciated, No rebound -guarding or rigidity. Extremities: No Cyanosis, trace to +1 edema.      Data Review  CBC  Lab 05/17/11 0445 05/16/11 0506 05/15/11 0607 05/14/11 0623 05/13/11 0510  WBC 9.9 8.0 8.4 8.5 10.5  HGB 9.0* 8.7* 8.4* 8.4* 9.1*  HCT 26.3* 25.6* 24.3* 25.4* 26.1*  PLT 383 346 356 333 377  MCV 96.0 95.9 94.2 96.2 95.3  MCH 32.8 32.6 32.6 31.8 33.2  MCHC 34.2 34.0 34.6 33.1 34.9  RDW 17.0* 16.9* 16.7* 17.2* 17.2*  LYMPHSABS -- -- -- -- --  MONOABS -- -- -- -- --  EOSABS -- -- -- -- --  BASOSABS -- -- -- -- --  BANDABS -- -- -- -- --    Chemistries   Lab 05/17/11 0445 05/16/11  0506 05/15/11 0607 05/14/11 0623 05/13/11 0510  NA 131* 133* 130* 130* 129*  K 3.3* 3.9 3.4* 3.1* 3.4*  CL 96 99 98 97 96  CO2 21 23 22 22 20   GLUCOSE 65* 87 98 93 108*  BUN 3* <3* <3* 3* 4*  CREATININE 0.57 0.58 0.57 0.60 0.60  CALCIUM 7.3* 7.7* 7.3* 7.3* 7.4*  MG 0.8* 1.0* 1.5 1.1* 1.0*  AST 18 17 18 17 16   ALT 12 14 14 14 16   ALKPHOS 132* 138* 141* 127* 136*  BILITOT 0.4 0.3 0.3 0.3 0.3   ------------------------------------------------------------------------------------------------------------------ estimated creatinine clearance is 84.6 ml/min (by C-G formula based on Cr of 0.57). ------------------------------------------------------------------------------------------------------------------ No results found for this basename: HGBA1C:2 in the last 72 hours ------------------------------------------------------------------------------------------------------------------ No results found for this basename: CHOL:2,HDL:2,LDLCALC:2,TRIG:2,CHOLHDL:2,LDLDIRECT:2 in the last 72 hours ------------------------------------------------------------------------------------------------------------------ No results found for this basename: TSH,T4TOTAL,FREET3,T3FREE,THYROIDAB in the last 72 hours ------------------------------------------------------------------------------------------------------------------ No results found for this basename: VITAMINB12:2,FOLATE:2,FERRITIN:2,TIBC:2,IRON:2,RETICCTPCT:2 in the last 72 hours  Coagulation profile No results found for this basename: INR:5,PROTIME:5 in the last 168 hours  No results found for this basename: DDIMER:2 in the last 72 hours  Cardiac Enzymes  Lab 05/10/11 1210  CKMB 4.2*  TROPONINI <0.30  MYOGLOBIN --   ------------------------------------------------------------------------------------------------------------------ No components found with this basename: POCBNP:3  Micro Results Recent Results (from the past 240 hour(s))    STOOL CULTURE     Status: Normal   Collection Time   05/07/11 10:22 PM      Component Value Range Status Comment   Specimen Description STOOL   Final    Special Requests NONE   Final    Culture     Final    Value: NO SALMONELLA, SHIGELLA, CAMPYLOBACTER, OR YERSINIA ISOLATED   Report Status 05/11/2011 FINAL   Final   CLOSTRIDIUM DIFFICILE BY PCR     Status: Normal   Collection Time   05/07/11 10:22 PM      Component Value Range Status Comment   C difficile by pcr NEGATIVE  NEGATIVE  Final   MRSA PCR SCREENING     Status: Abnormal   Collection Time   05/08/11  5:04 AM      Component Value Range Status Comment   MRSA by PCR POSITIVE (*) NEGATIVE  Final   CULTURE, BLOOD (ROUTINE X 2)     Status: Normal   Collection Time   05/09/11  7:30 AM      Component Value Range Status Comment   Specimen Description BLOOD LEFT ARM   Final    Special Requests BOTTLES DRAWN AEROBIC AND ANAEROBIC [facility]   Final    Culture  Setup Time [id number]   Final    Culture NO GROWTH 5 DAYS   Final    Report Status 05/15/2011 FINAL   Final   CULTURE, BLOOD (ROUTINE X 2)     Status: Normal   Collection Time   05/09/11  7:40 AM      Component Value Range Status Comment   Specimen Description BLOOD LEFT ARM   Final    Special Requests BOTTLES DRAWN AEROBIC AND ANAEROBIC [facility]   Final    Culture  Setup Time [id number]   Final    Culture NO GROWTH 5 DAYS   Final    Report Status 05/15/2011 FINAL   Final   URINE CULTURE     Status: Normal   Collection Time   05/13/11  3:08 PM      Component Value Range Status Comment   Specimen Description URINE, CLEAN CATCH   Final    Special Requests Normal   Final    Culture  Setup Time [id number]   Final    Colony Count 10,000 COLONIES/ML   Final    Culture     Final    Value: Multiple bacterial morphotypes present, none predominant. Suggest appropriate recollection if clinically indicated.   Report Status 05/14/2011 FINAL   Final     Radiology Reports No results  found.  Scheduled Meds:    . antiseptic oral rinse  15 mL Mouth Rinse q12n4p  . buPROPion  300 mg Oral Daily  . chlorhexidine  15 mL Mouth Rinse BID  . cyclobenzaprine  5 mg Oral TID  . dextromethorphan-guaiFENesin  1 tablet Oral BID  . diltiazem  180 mg Oral Daily  . lansoprazole  30 mg Oral Q1200  . loratadine  10 mg Oral Daily  . magnesium oxide  400 mg Oral BID  . metronidazole  500 mg Intravenous Q8H  . nicotine  14 mg Transdermal Daily  . potassium chloride  20 mEq Oral Daily  . potassium chloride  40 mEq Oral Q4H  . pseudoephedrine  30 mg Oral Daily  Continuous Infusions:    . sodium chloride 20 mL/hr at 05/16/11 1720   PRN Meds:.morphine injection, oxyCODONE, promethazine, promethazine, zolpidem, DISCONTD: diphenhydrAMINE, DISCONTD: fentaNYL, DISCONTD: midazolam  Assessment & Plan   1. Diarrhea illness with Hypotension on admission - Ischemic colitis vs infectious -status post colonoscopy on 1/28  and per Dr. Randa Evens possibly ischemic colitis, will await biopsies. -continue flagyl to total 14days, appreciate GI assistance.  -hypotension resolved 2. Atrial fibrillation now converted to NSR - continue oral Cardizem, rate controlled. per cardiology, due to multiple cardiac stressors-(diarrhea with hypotension, recent pneumonia). Echo shows normal LV function, mild LVH. MAC. Pt went into   Afib- 150s at 2.45 pm on 05-09-11- now NSR post Cardizem IV per Cards, Echo stable, TSH, stable, likely from   3. H/O Hypertension - Controlled on current meds.  4. AOCD + Dilutional - hemoglobin stable status post transfusion,  post >7 lits IVF, 1 unit transfused 1/21 as  H&H fell <7 post IVF 2lit bolus..    5. Pure hypercholesterolemia - home meds when able to tolerate PO.   6.Chronic back pain and spasms - pain control, on flexeril will continue.   7. H/O Recent PNA - some atelectasis on CXR , add IS, up in chair.   8. Elevated Transaminases - no RUQ  Tenderness - stable  trending to NML.   9. Acute renal insufficiency - from azotemia due to #1, creatinine normalized.   10. Hypomagnesemia and-  Started on oral mg, follow and recheck  11. Hyponatremia - improving.  12. Hypokalemia-  replete   13 perennial  allergies- continue Claritin-D. Marland Kitchen   DVT Prophylaxis  SCD        Sreeja Spies C M.D on 05/17/2011 at 7:58 AM  [facility] Office  [phone]

## 2011-05-18 LAB — COMPREHENSIVE METABOLIC PANEL
Alkaline Phosphatase: 153 U/L — ABNORMAL HIGH (ref 39–117)
BUN: 3 mg/dL — ABNORMAL LOW (ref 6–23)
GFR calc Af Amer: >90 mL/min (ref >90)
GFR calc non Af Amer: >90 mL/min (ref >90)
Glucose, Bld: 78 mg/dL (ref 70–99)
Potassium: 3.4 mEq/L — ABNORMAL LOW (ref 3.5–5.1)
Total Protein: 6.1 g/dL (ref 6.0–8.3)

## 2011-05-18 LAB — CBC
HCT: 29.4 % — ABNORMAL LOW (ref 36.0–46.0)
Hemoglobin: 9.8 g/dL — ABNORMAL LOW (ref 12.0–15.0)
MCH: 31.7 pg (ref 26.0–34.0)
MCHC: 33.3 g/dL (ref 30.0–36.0)

## 2011-05-18 LAB — MAGNESIUM: Magnesium: 0.8 mg/dL — CL (ref 1.5–2.5)

## 2011-05-18 MED ORDER — MAGNESIUM OXIDE 400 MG PO TABS
400.0000 mg | ORAL_TABLET | Freq: Three times a day (TID) | ORAL | Status: DC
  Administered 2011-05-18 – 2011-05-20 (×6): 400 mg via ORAL
  Filled 2011-05-18 (×8): qty 1

## 2011-05-18 MED ORDER — MAGNESIUM SULFATE 40 MG/ML IJ SOLN
2.0000 g | Freq: Once | INTRAMUSCULAR | Status: AC
  Administered 2011-05-18: 2 g via INTRAVENOUS
  Filled 2011-05-18: qty 50

## 2011-05-18 NOTE — Progress Notes (Signed)
[facility] PROGRESS NOTE Subjective Patient feels a little better is having less cramping unless diarrhea.  Objective: Vital signs in last 24 hours: Temp:  [97.2 F (36.2 C)-98.9 F (37.2 C)] 97.2 F (36.2 C) (01/30 0600) Pulse Rate:  [74-102] 94  (01/30 0600) Resp:  [20-22] 20  (01/30 0600) BP: (134-166)/(65-94) 134/85 mmHg (01/30 0600) SpO2:  [90 %-96 %] 93 % (01/30 0600) Last BM Date: 05/18/11  Intake/Output from previous day: 01/29 0701 - 01/30 0700 In: 600 [P.O.:600] Out: -  Intake/Output this shift:    [id number] with bowel sounds mild tenderness  Lab Results:  Basename 05/18/11 0525 05/17/11 0445 05/16/11 0506  WBC 10.9* 9.9 8.0  HGB 9.8* 9.0* 8.7*  HCT 29.4* 26.3* 25.6*  PLT 443* 383 346   BMET  Basename 05/18/11 0525 05/17/11 0445 05/16/11 0506  NA 130* 131* 133*  K 3.4* 3.3* 3.9  CL 93* 96 99  CO2 21 21 23   CREATININE 0.60 0.57 0.58   LFT  Basename 05/18/11 0525 05/17/11 0445 05/16/11 0506  PROT 6.1 5.5* 5.5*  AST 22 18 17   ALT 15 12 14   ALKPHOS 153* 132* 138*  BILITOT 0.4 0.4 0.3  BILIDIR -- -- --  IBILI -- -- --   PT/INR No results found for this basename: LABPROT:3,INR:3 in the last 72 hours PANCREAS No results found for this basename: LIPASE:3 in the last 72 hours  PATH; consistent with ischemic colitis     Studies/Results: No results found.  Medications: I have reviewed the patient's current medications.  Assessment/Plan: 1. Ischemic colitis. This was apparently a very significant until given the extent of the ulceration. She clinically is feeling better. I think we can remove the enteric precautions and slowly advance her diet. We'll continue supportive therapy at this time.would continue to correct electrolytes. We'll start her on noodles, potatoes in scrambled eggs as initial step. Go ahead and continue Flagyl for now.   Dynastie Knoop JR,Wilhelmenia Addis L 05/18/2011, 9:17 AM

## 2011-05-18 NOTE — Progress Notes (Signed)
Paged to restart outdated PIV.  Pt requesting to wait until tomorrow because MD said she may be discharged tomorrow.  Site WNL.  Denies any pain or discomfort.  Primary RN made aware.   IV Team will check with her tomorrow and if not discharged will restart PIV.

## 2011-05-18 NOTE — Progress Notes (Signed)
Subjective: Feels a little better. Less abdominal cramping, 3-4 episodes of diarrhea today.  Objective: Vital signs in last 24 hours: Temp:  [97.2 F (36.2 C)-98.9 F (37.2 C)] 98.1 F (36.7 C) (01/30 1425) Pulse Rate:  [92-102] 96  (01/30 1425) Resp:  [18-20] 20  (01/30 1425) BP: (134-167)/(65-87) 167/85 mmHg (01/30 1425) SpO2:  [90 %-96 %] 96 % (01/30 1425) Weight change:  Last BM Date: 05/18/11  Intake/Output from previous day: 01/29 0701 - 01/30 0700 In: 600 [P.O.:600] Out: -  Total I/O In: 240 [P.O.:240] Out: -    Physical Exam: General: Alert, awake, oriented x3, in no acute distress. HEENT: No bruits, no goiter. Heart: Regular rate and rhythm, without murmurs, rubs, gallops. Lungs: Clear to auscultation bilaterally. Abdomen: Soft, nontender, nondistended, positive bowel sounds. Extremities: No clubbing cyanosis or edema with positive pedal pulses. Neuro: Grossly intact, nonfocal.    Lab Results: Basic Metabolic Panel:  Basename 05/18/11 0525 05/17/11 0445  NA 130* 131*  K 3.4* 3.3*  CL 93* 96  CO2 21 21  GLUCOSE 78 65*  BUN 3* 3*  CREATININE 0.60 0.57  CALCIUM 7.3* 7.3*  MG 0.8* 0.8*  PHOS -- --   Liver Function Tests:  Basename 05/18/11 0525 05/17/11 0445  AST 22 18  ALT 15 12  ALKPHOS 153* 132*  BILITOT 0.4 0.4  PROT 6.1 5.5*  ALBUMIN 2.8* 2.6*   CBC:  Basename 05/18/11 0525 05/17/11 0445  WBC 10.9* 9.9  NEUTROABS -- --  HGB 9.8* 9.0*  HCT 29.4* 26.3*  MCV 95.1 96.0  PLT 443* 383    Recent Results (from the past 240 hour(s))  CULTURE, BLOOD (ROUTINE X 2)     Status: Normal   Collection Time   05/09/11  7:30 AM      Component Value Range Status Comment   Specimen Description BLOOD LEFT ARM   Final    Special Requests BOTTLES DRAWN AEROBIC AND ANAEROBIC [employer] [facility]   Final    Culture  Setup Time [id number]   Final    Culture NO GROWTH 5 DAYS   Final    Report Status 05/15/2011 FINAL   Final   CULTURE, BLOOD (ROUTINE X 2)     Status:  Normal   Collection Time   05/09/11  7:40 AM      Component Value Range Status Comment   Specimen Description BLOOD LEFT ARM   Final    Special Requests BOTTLES DRAWN AEROBIC AND ANAEROBIC [facility]   Final    Culture  Setup Time [id number]   Final    Culture NO GROWTH 5 DAYS   Final    Report Status 05/15/2011 FINAL   Final   URINE CULTURE     Status: Normal   Collection Time   05/13/11  3:08 PM      Component Value Range Status Comment   Specimen Description URINE, CLEAN CATCH   Final    Special Requests Normal   Final    Culture  Setup Time [id number]   Final    Colony Count 10,000 COLONIES/ML   Final    Culture     Final    Value: Multiple bacterial morphotypes present, none predominant. Suggest appropriate recollection if clinically indicated.   Report Status 05/14/2011 FINAL   Final     Studies/Results: No results found.  Medications: Scheduled Meds:   . antiseptic oral rinse  15 mL Mouth Rinse q12n4p  . buPROPion  300 mg Oral Daily  . chlorhexidine  15 mL Mouth Rinse BID  . cyclobenzaprine  5 mg Oral TID  . dextromethorphan-guaiFENesin  1 tablet Oral BID  . diltiazem  180 mg Oral Daily  . lansoprazole  30 mg Oral Q1200  . loratadine  10 mg Oral Daily  . magnesium oxide  400 mg Oral TID  . magnesium sulfate 1 - 4 g bolus IVPB  2 g Intravenous Once  . metronidazole  500 mg Intravenous Q8H  . nicotine  14 mg Transdermal Daily  . potassium chloride  20 mEq Oral Daily  . potassium chloride  40 mEq Oral Q4H  . pseudoephedrine  30 mg Oral Daily  . DISCONTD: magnesium oxide  400 mg Oral BID   Continuous Infusions:   . sodium chloride 20 mL/hr at 05/16/11 1720   PRN Meds:.morphine injection, oxyCODONE, promethazine, promethazine, zolpidem  Assessment/Plan:  Principal Problem:  *Diarrhea Active Problems:  Benign hypertensive heart disease without heart failure  Pure hypercholesterolemia  Anemia  Dehydration  Chronic back pain  Atrial fibrillation with rapid  ventricular response   #1 colitis: Pathology report seems to favor ischemic colitis. Diarrhea and abdominal cramping have improved. Agree with advancing diet. Appreciate GI recommendations. Would discontinue Flagyl given no signs of bacterial infection.  #2 atrial fibrillation with RVR: Resolved. Likely secondary to hypotension and diarrhea.  #3 hypomagnesemia and hypokalemia: Replete.   LOS: 11 days   HERNANDEZ ACOSTA,ESTELA [facility] Pager: [phone] 05/18/2011, 3:44 PM

## 2011-05-18 NOTE — Progress Notes (Signed)
Patient discussed at the Long Length of Stay [name] Weeks 05/18/2011  

## 2011-05-19 LAB — COMPREHENSIVE METABOLIC PANEL
ALT: 13 U/L (ref 0–35)
Calcium: 7.4 mg/dL — ABNORMAL LOW (ref 8.4–10.5)
Creatinine, Ser: 0.56 mg/dL (ref 0.50–1.10)
GFR calc Af Amer: >90 mL/min (ref >90)
Glucose, Bld: 81 mg/dL (ref 70–99)
Sodium: 133 mEq/L — ABNORMAL LOW (ref 135–145)
Total Protein: 5.6 g/dL — ABNORMAL LOW (ref 6.0–8.3)

## 2011-05-19 LAB — CBC
MCH: 32.5 pg (ref 26.0–34.0)
MCHC: 33.8 g/dL (ref 30.0–36.0)
Platelets: 395 10*3/uL (ref 150–400)

## 2011-05-19 MED ORDER — POTASSIUM CHLORIDE CRYS ER 20 MEQ PO TBCR
40.0000 meq | EXTENDED_RELEASE_TABLET | Freq: Once | ORAL | Status: AC
  Administered 2011-05-19: 40 meq via ORAL
  Filled 2011-05-19: qty 2

## 2011-05-19 NOTE — Progress Notes (Signed)
Patient refuse to have IV change on two different occasions, IV team notified on both occasions to come and start new IV that was dated from the 25 of Jan. 2013, Patient's logic for refusal was that she was informed that she would problilty be discharged on the next day, but the patient was not discharged, so Nurse continued to inform patient that IV needed to be changed, but patient still did not want to be stuck again if she was going to be discharged in a couple of days.

## 2011-05-19 NOTE — Progress Notes (Signed)
[facility] PROGRESS NOTE Subjective Feels better tolerated the soft bland diet. Still having some cramping but no diarrhea  Objective: Vital signs in last 24 hours: Temp:  [97.8 F (36.6 C)-98.8 F (37.1 C)] 97.8 F (36.6 C) (01/31 0600) Pulse Rate:  [90-102] 92  (01/31 0600) Resp:  [16-20] 20  (01/31 0600) BP: (127-167)/(75-87) 134/76 mmHg (01/31 0600) SpO2:  [92 %-97 %] 93 % (01/31 0600) Last BM Date: 05/18/11  Intake/Output from previous day: 01/30 0701 - 01/31 0700 In: 1010 [P.O.:840; I.V.:120; IV Piggyback:50] Out: -  Intake/Output this shift:    PE: Gen.-patient operating room without problems Abdomen-nondistended generally soft with mild tenderness good bowel sounds  Lab Results:  Basename 05/19/11 0514 05/18/11 0525 05/17/11 0445  WBC 7.3 10.9* 9.9  HGB 9.4* 9.8* 9.0*  HCT 27.8* 29.4* 26.3*  PLT 395 443* 383   BMET  Basename 05/19/11 0514 05/18/11 0525 05/17/11 0445  NA 133* 130* 131*  K 3.5 3.4* 3.3*  CL 98 93* 96  CO2 23 21 21   CREATININE 0.56 0.60 0.57   LFT  Basename 05/19/11 0514 05/18/11 0525 05/17/11 0445  PROT 5.6* 6.1 5.5*  AST 25 22 18   ALT 13 15 12   ALKPHOS 149* 153* 132*  BILITOT 0.3 0.4 0.4  BILIDIR -- -- --  IBILI -- -- --   PT/INR No results found for this basename: LABPROT:3,INR:3 in the last 72 hours PANCREAS No results found for this basename: LIPASE:3 in the last 72 hours       Studies/Results: No results found.  Medications: I have reviewed the patient's current medications.  Assessment/Plan: 1. Ischemic colitis. Tolerating diet. Will advance to low-residue for supper. We tried to go ahead and decrease IV fluids, medications etc. If she is tolerating diet and maintain electrolytes orally tomorrow she could go home on very low residue diet.   Fahmida Jurich JR,Corrine Tillis L 05/19/2011, 8:35 AM

## 2011-05-19 NOTE — Progress Notes (Signed)
Subjective: Feels much better. No further diarrhea, some abdominal cramping, is tolerating a solid bland diet without difficulties.  Objective: Vital signs in last 24 hours: Temp:  [97.8 F (36.6 C)-98.8 F (37.1 C)] 98.2 F (36.8 C) (01/31 1030) Pulse Rate:  [90-106] 106  (01/31 1030) Resp:  [16-20] 20  (01/31 1030) BP: (122-167)/(75-86) 122/86 mmHg (01/31 1030) SpO2:  [92 %-97 %] 94 % (01/31 1030) Weight change:  Last BM Date: 05/18/11  Intake/Output from previous day: 01/30 0701 - 01/31 0700 In: 1010 [P.O.:840; I.V.:120; IV Piggyback:50] Out: -      Physical Exam: General: Alert, awake, oriented x3, in no acute distress. HEENT: No bruits, no goiter. Heart: Regular rate and rhythm, without murmurs, rubs, gallops. Lungs: Clear to auscultation bilaterally. Abdomen: Soft, nontender, nondistended, positive bowel sounds. Extremities: No clubbing cyanosis or edema with positive pedal pulses. Neuro: Grossly intact, nonfocal.    Lab Results: Basic Metabolic Panel:  Basename 05/19/11 0514 05/18/11 0525  NA 133* 130*  K 3.5 3.4*  CL 98 93*  CO2 23 21  GLUCOSE 81 78  BUN <3* 3*  CREATININE 0.56 0.60  CALCIUM 7.4* 7.3*  MG 1.3* 0.8*  PHOS -- --   Liver Function Tests:  [facility] 05/19/11 0514 05/18/11 0525  AST 25 22  ALT 13 15  ALKPHOS 149* 153*  BILITOT 0.3 0.4  PROT 5.6* 6.1  ALBUMIN 2.6* 2.8*   CBC:  Basename 05/19/11 0514 05/18/11 0525  WBC 7.3 10.9*  NEUTROABS -- --  HGB 9.4* 9.8*  HCT 27.8* 29.4*  MCV 96.2 95.1  PLT 395 443*    Recent Results (from the past 240 hour(s))  URINE CULTURE     Status: Normal   Collection Time   05/13/11  3:08 PM      Component Value Range Status Comment   Specimen Description URINE, CLEAN CATCH   Final    Special Requests Normal   Final    Culture  Setup Time [id number]   Final    Colony Count 10,000 COLONIES/ML   Final    Culture     Final    Value: Multiple bacterial morphotypes present, none predominant.  Suggest appropriate recollection if clinically indicated.   Report Status 05/14/2011 FINAL   Final     Studies/Results: No results found.  Medications: Scheduled Meds:    . antiseptic oral rinse  15 mL Mouth Rinse q12n4p  . buPROPion  300 mg Oral Daily  . chlorhexidine  15 mL Mouth Rinse BID  . cyclobenzaprine  5 mg Oral TID  . dextromethorphan-guaiFENesin  1 tablet Oral BID  . diltiazem  180 mg Oral Daily  . lansoprazole  30 mg Oral Q1200  . loratadine  10 mg Oral Daily  . magnesium oxide  400 mg Oral TID  . magnesium sulfate 1 - 4 g bolus IVPB  2 g Intravenous Once  . nicotine  14 mg Transdermal Daily  . potassium chloride  20 mEq Oral Daily  . potassium chloride  40 mEq Oral Once  . pseudoephedrine  30 mg Oral Daily  . DISCONTD: magnesium oxide  400 mg Oral BID  . DISCONTD: metronidazole  500 mg Intravenous Q8H   Continuous Infusions:    . sodium chloride 20 mL/hr at 05/16/11 1720   PRN Meds:.morphine injection, oxyCODONE, promethazine, promethazine, zolpidem  Assessment/Plan:  Principal Problem:  *Diarrhea Active Problems:  Benign hypertensive heart disease without heart failure  Pure hypercholesterolemia  Anemia  Dehydration  Chronic back pain  Atrial fibrillation with rapid ventricular response   #1 colitis: Pathology report seems to favor ischemic colitis. Diarrhea and abdominal cramping have improved. Agree with advancing diet. Appreciate GI recommendations. Would discontinue Flagyl given no signs of bacterial infection.  #2 atrial fibrillation with RVR: Resolved. Likely secondary to hypotension and diarrhea.  #3 hypomagnesemia and hypokalemia: Replete.   LOS: 12 days   [name],[name] [facility] Pager: [phone] 05/19/2011, 10:47 AM

## 2011-05-20 LAB — COMPREHENSIVE METABOLIC PANEL
ALT: 12 U/L (ref 0–35)
AST: 22 U/L (ref 0–37)
Alkaline Phosphatase: 143 U/L — ABNORMAL HIGH (ref 39–117)
CO2: 23 mEq/L (ref 19–32)
Calcium: 7.4 mg/dL — ABNORMAL LOW (ref 8.4–10.5)
Chloride: 98 mEq/L (ref 96–112)
GFR calc Af Amer: >90 mL/min (ref >90)
GFR calc non Af Amer: >90 mL/min (ref >90)
Glucose, Bld: 105 mg/dL — ABNORMAL HIGH (ref 70–99)
Potassium: 3.4 mEq/L — ABNORMAL LOW (ref 3.5–5.1)
Sodium: 130 mEq/L — ABNORMAL LOW (ref 135–145)
Total Bilirubin: 0.3 mg/dL (ref 0.3–1.2)

## 2011-05-20 LAB — CBC
Hemoglobin: 9.1 g/dL — ABNORMAL LOW (ref 12.0–15.0)
MCH: 33.1 pg (ref 26.0–34.0)
RBC: 2.75 MIL/uL — ABNORMAL LOW (ref 3.87–5.11)
WBC: 8.5 10*3/uL (ref 4.0–10.5)

## 2011-05-20 MED ORDER — HYDROCODONE-ACETAMINOPHEN 5-500 MG PO TABS: 1.0000 | ORAL_TABLET | Freq: Four times a day (QID) | ORAL | Status: AC | PRN

## 2011-05-20 MED ORDER — MAGNESIUM OXIDE 400 MG PO TABS: 400.0000 mg | ORAL_TABLET | Freq: Three times a day (TID) | ORAL | Status: DC

## 2011-05-20 MED ORDER — DILTIAZEM HCL ER 180 MG PO CP24: 180.0000 mg | ORAL_CAPSULE | Freq: Every day | ORAL | Status: DC

## 2011-05-20 NOTE — Discharge Summary (Signed)
Physician Discharge Summary  Patient ID: [name] MRN: [id number] DOB/AGE: 17-Jan-1951 61 y.o.  Admit date: 05/07/2011 Discharge date: 05/20/2011  Primary Care Physician:  No primary provider on file.   Discharge Diagnoses:    Principal Problem:  *Diarrhea Active Problems:  Benign hypertensive heart disease without heart failure  Pure hypercholesterolemia  Anemia  Dehydration  Chronic back pain  Atrial fibrillation with rapid ventricular response  ischemic colitis   hypomagnesemia  hypokalemia   Medication List  As of 05/20/2011  1:07 PM   STOP taking these medications         celecoxib 200 MG capsule      naproxen sodium 220 MG tablet      promethazine 25 MG tablet         TAKE these medications         benazepril 20 MG tablet   Commonly known as: LOTENSIN   Take 20 mg by mouth daily.      buPROPion 300 MG 24 hr tablet   Commonly known as: WELLBUTRIN XL   Take 300 mg by mouth daily.      CALCIUM + D PO   Take 500 mg by mouth daily.      cyclobenzaprine 10 MG tablet   Commonly known as: FLEXERIL   Take 10 mg by mouth as needed. Muscle spasms      diltiazem 180 MG 24 hr capsule   Commonly known as: DILACOR XR   Take 1 capsule (180 mg total) by mouth daily.      estradiol 2 MG tablet   Commonly known as: ESTRACE   Take 2 mg by mouth daily.      hydrochlorothiazide 25 MG tablet   Commonly known as: HYDRODIURIL   Daily or as directed      HYDROcodone-acetaminophen 5-500 MG per tablet   Commonly known as: VICODIN   Take 1 tablet by mouth every 6 (six) hours as needed for pain.      magnesium oxide 400 MG tablet   Commonly known as: MAG-OX   Take 1 tablet (400 mg total) by mouth 3 (three) times daily.      montelukast 10 MG tablet   Commonly known as: SINGULAIR   Take 10 mg by mouth as needed. For breathing nasal drainage congestion relief      MULTIVITAMIN PO   Take by mouth.             Disposition and Follow-up:  Patient will be  discharged home today in stable and improved condition. He is instructed to followup with her primary care physician in 2 weeks.  Consults:  GI Dr. Randa Evens   Significant Diagnostic Studies:  Ct Abdomen Pelvis Wo Contrast  05/08/2011  *RADIOLOGY REPORT*  Clinical Data: Abdominal pain, diarrhea  CT ABDOMEN AND PELVIS WITHOUT CONTRAST  Technique:  Multidetector CT imaging of the abdomen and pelvis was performed following the standard protocol without intravenous contrast.  Comparison: 06/23/2008  Findings: Patchy opacities in the bilateral lung bases, likely atelectasis.  Gastric distension. Fluid within the distal esophagus, suggesting gastroesophageal reflux or esophageal dysmotility.  Unenhanced liver, spleen, pancreas, and adrenal glands are within normal limits.  Gallbladder is unremarkable.  No intrahepatic or extrahepatic ductal dilatation.  Kidneys are within normal limits.  No renal calculi or hydronephrosis.  No evidence of small bowel obstruction.  A few dilated loops of small and large bowel in the right upper abdomen, possibly reflecting secondary adynamic ileus.  Colonic wall thickening with  surrounding inflammatory changes involving the distal transverse colon/proximal descending colon (series 2/image 45), suggesting colitis.  Suture line in the lower abdomen (series 2/image 80).  Atherosclerotic calcifications of the abdominal aorta and branch vessels.  No abdominopelvic ascites.  No suspicious abdominopelvic lymphadenopathy.  Status post hysterectomy.  No adnexal masses.  Bladder is within normal limits.  Extensive degenerative changes of the visualized thoracolumbar spine.  Grade 1 anterolisthesis of L4 on L5, unchanged.  IMPRESSION: Colonic wall thickening with surrounding inflammatory changes involving the distal transverse/proximal descending colon, suggesting infectious/inflammatory colitis.  No evidence of small bowel obstruction.  Possible secondary adynamic ileus.  Gastric distension  with possible gastroesophageal reflux versus esophageal dysmotility.  Original Report Authenticated By: Charline Bills, M.D.   Dg Chest Port 1 View  05/08/2011  *RADIOLOGY REPORT*  Clinical Data: Fatigue, cough  PORTABLE CHEST - 1 VIEW  Comparison: 07/19/2007  Findings: Low lung volumes with vascular crowding.  No frank interstitial edema. No pleural effusion or pneumothorax.  Cardiomediastinal silhouette is within normal limits.  IMPRESSION: Low lung volumes with vascular crowding.  Original Report Authenticated By: Charline Bills, M.D.   Dg Abd Portable 1v  05/08/2011  *RADIOLOGY REPORT*  Clinical Data: Abdominal pain  PORTABLE ABDOMEN - 1 VIEW  Comparison: [facility] CT abdomen pelvis dated 06/23/2008  Findings: Mildly dilated loops of small bowel in the lower abdomen. Mildly dilated loops of ascending/transverse colon.  This appearance favors adynamic ileus over small bowel obstruction.  Surgical sutures in the pelvis.  Degenerative changes of the visualized thoracolumbar spine. Degenerative changes of the left hip.  IMPRESSION: Mildly dilated loops of small bowel and colon, favoring adynamic ileus over small bowel obstruction.  Original Report Authenticated By: Charline Bills, M.D.    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 Had acute onset crampy abdominal pain, diarrhea, 10+ loose, "explosive" stools. Because of this we are asked to admit her for further evaluation and management.     Hospital Course:  Principal Problem:  *Diarrhea Active Problems:  Benign hypertensive heart disease without heart failure  Pure hypercholesterolemia  Anemia  Dehydration  Chronic back pain  Atrial fibrillation with rapid ventricular response  ischemic  colitis Hypomagnesemia Hypokalemia  #1 diarrhea: Patient has had a colonoscopy with pathology consistent with ischemic colitis. Diarrhea and abdominal cramping have resolved. She was on Flagyl which has been discontinued as there is no evidence for bacterial infection. GI has recommended a low residue diet. Patient has been tolerating diet without issue. She has been deemed stable for discharge home today.  #2 atrial fibrillation with rapid ventricular response: This has resolved. We presume that this was secondary to her acute diarrheal illness and hypotension. She has been maintained on Cardizem without recurrence of her A. fib.  #3 electrolyte disturbances: Patient has had significant hypokalemia and hypomagnesemia presumably secondary to her diarrhea. Her potassium has been repleted successfully. Her magnesium remains low. She will be discharged on a week's worth of magnesium supplementation.  Time spent on Discharge: Greater than 30 minutes.  SignedChaya Jan [facility] Pager: [phone] 05/20/2011, 1:07 PM

## 2011-05-20 NOTE — Progress Notes (Signed)
[facility] PROGRESS NOTE Subjective Feels much better, tolerating diet, less pain  Objective: Vital signs in last 24 hours: Temp:  [98.2 F (36.8 C)-98.4 F (36.9 C)] 98.4 F (36.9 C) (02/01 0630) Pulse Rate:  [90-106] 91  (02/01 0630) Resp:  [20] 20  (02/01 0630) BP: (118-145)/(72-88) 118/76 mmHg (02/01 0630) SpO2:  [93 %-100 %] 93 % (02/01 0630) Last BM Date: 05/18/11  Intake/Output from previous day: 01/31 0701 - 02/01 0700 In: 360 [P.O.:240; I.V.:120] Out: -  Intake/Output this shift:    PE: Abd-soft minimal tenderness  Lab Results:  Basename 05/20/11 0452 05/19/11 0514 05/18/11 0525  WBC 8.5 7.3 10.9*  HGB 9.1* 9.4* 9.8*  HCT 26.3* 27.8* 29.4*  PLT 389 395 443*   BMET  Basename 05/20/11 0452 05/19/11 0514 05/18/11 0525  NA 130* 133* 130*  K 3.4* 3.5 3.4*  CL 98 98 93*  CO2 23 23 21   CREATININE 0.51 0.56 0.60   LFT  Basename 05/20/11 0452 05/19/11 0514 05/18/11 0525  PROT 5.5* 5.6* 6.1  AST 22 25 22   ALT 12 13 15   ALKPHOS 143* 149* 153*  BILITOT 0.3 0.3 0.4  BILIDIR -- -- --  IBILI -- -- --   PT/INR No results found for this basename: LABPROT:3,INR:3 in the last 72 hours PANCREAS No results found for this basename: LIPASE:3 in the last 72 hours       Studies/Results: No results found.  Medications: I have reviewed the patient's current medications.  Assessment/Plan: 1. Ischemic Colitis.  Appears to be resolving.  Would D/C home on low residue diet and Miralax and will f/u in office in 3-4 weeks. Discussed the diet with  The patient   Shawnda Mauney JR,Savion Washam L 05/20/2011, 9:12 AM

## 2011-06-15 ENCOUNTER — Encounter: Payer: Self-pay | Admitting: Cardiology

## 2011-06-15 ENCOUNTER — Ambulatory Visit (INDEPENDENT_AMBULATORY_CARE_PROVIDER_SITE_OTHER): Payer: BC Managed Care – PPO | Admitting: Cardiology

## 2011-06-15 ENCOUNTER — Other Ambulatory Visit: Payer: BC Managed Care – PPO

## 2011-06-15 ENCOUNTER — Other Ambulatory Visit: Payer: Self-pay | Admitting: Cardiology

## 2011-06-15 VITALS — BP 140/98 | HR 118 | Ht 66.0 in

## 2011-06-15 DIAGNOSIS — I119 Hypertensive heart disease without heart failure: Secondary | ICD-10-CM

## 2011-06-15 DIAGNOSIS — D649 Anemia, unspecified: Secondary | ICD-10-CM

## 2011-06-15 DIAGNOSIS — K559 Vascular disorder of intestine, unspecified: Secondary | ICD-10-CM

## 2011-06-15 DIAGNOSIS — I480 Paroxysmal atrial fibrillation: Secondary | ICD-10-CM

## 2011-06-15 DIAGNOSIS — I4891 Unspecified atrial fibrillation: Secondary | ICD-10-CM

## 2011-06-15 MED ORDER — BISOPROLOL FUMARATE 5 MG PO TABS: ORAL_TABLET | ORAL | Status: DC

## 2011-06-15 NOTE — Assessment & Plan Note (Signed)
He has had no further atrial fibrillation but her EKG today does show sinus tachycardia at 115 per minute.  She is not on a beta blocker.  She was given when necessary beta blocker during her hospital stay.  We will place her on bisoprolol 5 mg tablets to take a half a tablet daily.

## 2011-06-15 NOTE — Telephone Encounter (Signed)
Refilled benazepril

## 2011-06-15 NOTE — Progress Notes (Signed)
[name] Date of Birth:  01/19/1951 [facility] [address] [place], [place]  [address] [phone]         Fax   [phone]  History of Present Illness: This pleasant 61 year old woman is seen for a followup office visit.  He was recently hospitalized on the medical service and GI service at [facility] long after presenting with sudden onset severe diarrhea associated with syncope and hypotension.  She states that when she arrived at the emergency room her blood pressure was 70 systolic.  Previously she has always been hypertensive.  She was initially in the intensive care unit for IV fluid resuscitation.  She underwent a colonoscopy by Dr. Randa Evens which showed right colon ischemia and she was diagnosed with ischemic colitis.  No arteriogram was done.  The patient also had anemia during the hospital stay and received a unit of packed cells.  She states that her diarrhea was not bloody but toward the end of the hospital stay she did have some blood in her stools.  During the hospital stay she had several episodes of paroxysmal atrial fibrillation with a rapid ventricular response.  She history successfully with IV beta blocker.  Her echocardiogram showed normal left ventricular systolic function and normal atrial size.  She did have mitral annular calcification.  At the time of discharge she was still anemic and she saw Dr. Randa Evens in his office last week and her hemoglobin had come up to 10.2.  He has had no further episodes of paroxysmal atrial tachycardia since discharge but she has been running a sinus tachycardia.  She still feels weak.  She has been out of work for a month now.  Current Outpatient Prescriptions  Medication Sig Dispense Refill  . benazepril (LOTENSIN) 20 MG tablet take 1 tablet by mouth once daily  30 tablet  PRN  . buPROPion (WELLBUTRIN XL) 300 MG 24 hr tablet Take 300 mg by mouth daily.       . Calcium Carbonate-Vitamin D (CALCIUM + D PO) Take 500 mg  by mouth daily.        . celecoxib (CELEBREX) 200 MG capsule Take 200 mg by mouth daily.      . cyclobenzaprine (FLEXERIL) 10 MG tablet Take 10 mg by mouth as needed. Muscle spasms      . diltiazem (DILACOR XR) 180 MG 24 hr capsule Take 1 capsule (180 mg total) by mouth daily.  30 capsule  1  . estradiol (ESTRACE) 2 MG tablet Take 2 mg by mouth daily.        . hydrochlorothiazide 25 MG tablet Daily or as directed  30 tablet  11  . loratadine-pseudoephedrine (CLARITIN-D 24-HOUR) 10-240 MG per 24 hr tablet Take 1 tablet by mouth daily. As needed      . montelukast (SINGULAIR) 10 MG tablet Take 10 mg by mouth as needed. For breathing nasal drainage congestion relief      . Multiple Vitamin (MULTIVITAMIN PO) Take by mouth.        . bisoprolol (ZEBETA) 5 MG tablet 1/2 tablet daily  15 tablet  3    Allergies  Allergen Reactions  . Codeine Nausea And Vomiting  . Minitabs (Aspirin) Nausea Only  . Doxycycline Rash  . Penicillins Hives and Rash    Patient Active Problem List  Diagnoses  . 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 Pure hypercholesterolemia  . Anemia  . Diarrhea  . Dehydration  . Chronic back pain  .  Atrial fibrillation with rapid ventricular response    History  Smoking status  . Current Some Day Smoker  Smokeless tobacco  . Not on file    History  Alcohol Use No    No family history on file.  Review of Systems: Constitutional: no fever chills diaphoresis or fatigue or change in weight.  Head and neck: no hearing loss, no epistaxis, no photophobia or visual disturbance. Respiratory: No cough, shortness of breath or wheezing. Cardiovascular: No chest pain peripheral edema, palpitations. Gastrointestinal: No abdominal distention, no abdominal pain, no change in bowel habits hematochezia or melena. Genitourinary: No dysuria, no frequency, no urgency, no nocturia. Musculoskeletal:No arthralgias, no back pain, no gait disturbance or  myalgias. Neurological: No dizziness, no headaches, no numbness, no seizures, no syncope, no weakness, no tremors. Hematologic: No lymphadenopathy, no easy bruising. Psychiatric: No confusion, no hallucinations, no sleep disturbance.    Physical Exam: Filed Vitals:   06/15/11 1110  BP: 140/98  Pulse: 118   the general appearance reveals a well-developed well-nourished woman in no distress.Pupils equal and reactive.   Extraocular Movements are full.  There is no scleral icterus.  The mouth and pharynx are normal.  The neck is supple.  The carotids reveal no bruits.  The jugular venous pressure is normal.  The thyroid is not enlarged.  There is no lymphadenopathy.  The chest is clear to percussion and auscultation. There are no rales or rhonchi. Expansion of the chest is symmetrical.  The heart reveals persistent sinus tachycardia.  Questionable S4 gallop.  No S3.  No murmur.The abdomen is soft and nontender. Bowel sounds are normal. The liver and spleen are not enlarged. There Are no abdominal masses. There are no bruits.  The pedal pulses are good.  There is no phlebitis or edema.  There is no cyanosis or clubbing. Strength is normal and symmetrical in all extremities.  There is no lateralizing weakness.  There are no sensory deficits.  The skin is warm and dry.  There is no rash.  EKG today shows sinus tachycardia and no ischemic changes.   Assessment / Plan: Continue same medication and start bisoprolol 5 mg taking one half tablet daily to help with persistent sinus tachycardia.  Recheck here in 2 months for followup office visit and EKG.

## 2011-06-15 NOTE — Patient Instructions (Signed)
WILL HAVE YOU START BISOPROLOL 5 MG 1/2 TABLET DAILY, RX SENT TO PHARMACY Your physician recommends that you schedule a follow-up appointment in: 2 MONTHS OV/EKG

## 2011-06-15 NOTE — Assessment & Plan Note (Signed)
The patient has a long history of poorly controlled high blood pressure.  She has had a history of exogenous obesity.  She is a former smoker.  Her blood pressures since her recent hospitalization have been lower but are still borderline elevated.  She was given diltiazem CD to take when she left the hospital but has not been taking it because it causes her to have migraine headaches.

## 2011-06-15 NOTE — Assessment & Plan Note (Signed)
The patient had a history of recent anemia.  She states that her serum iron levels were not low.  She is taking a multivitamin.  Dr. Randa Evens is following her hemoglobin.

## 2011-06-20 NOTE — Progress Notes (Signed)
Addended by: Judithe Modest D on: 06/20/2011 11:28 AM   Modules accepted: Orders

## 2011-07-05 ENCOUNTER — Emergency Department (HOSPITAL_COMMUNITY): Payer: BC Managed Care – PPO

## 2011-07-05 ENCOUNTER — Encounter (HOSPITAL_COMMUNITY): Payer: Self-pay | Admitting: *Deleted

## 2011-07-05 ENCOUNTER — Observation Stay (HOSPITAL_COMMUNITY)
Admission: EM | Admit: 2011-07-05 | Discharge: 2011-07-07 | Disposition: A | Discharge status: Home / Self Care | Payer: BC Managed Care – PPO | Attending: Family Medicine | Admitting: Family Medicine

## 2011-07-05 DIAGNOSIS — Z79899 Other long term (current) drug therapy: Secondary | ICD-10-CM | POA: Insufficient documentation

## 2011-07-05 DIAGNOSIS — I480 Paroxysmal atrial fibrillation: Secondary | ICD-10-CM

## 2011-07-05 DIAGNOSIS — K551 Chronic vascular disorders of intestine: Secondary | ICD-10-CM

## 2011-07-05 DIAGNOSIS — K56609 Unspecified intestinal obstruction, unspecified as to partial versus complete obstruction: Principal | ICD-10-CM | POA: Insufficient documentation

## 2011-07-05 DIAGNOSIS — F17200 Nicotine dependence, unspecified, uncomplicated: Secondary | ICD-10-CM | POA: Insufficient documentation

## 2011-07-05 DIAGNOSIS — E860 Dehydration: Secondary | ICD-10-CM

## 2011-07-05 DIAGNOSIS — M549 Dorsalgia, unspecified: Secondary | ICD-10-CM | POA: Insufficient documentation

## 2011-07-05 DIAGNOSIS — G8929 Other chronic pain: Secondary | ICD-10-CM

## 2011-07-05 DIAGNOSIS — I10 Essential (primary) hypertension: Secondary | ICD-10-CM | POA: Insufficient documentation

## 2011-07-05 DIAGNOSIS — I119 Hypertensive heart disease without heart failure: Secondary | ICD-10-CM

## 2011-07-05 DIAGNOSIS — E669 Obesity, unspecified: Secondary | ICD-10-CM | POA: Insufficient documentation

## 2011-07-05 DIAGNOSIS — R197 Diarrhea, unspecified: Secondary | ICD-10-CM

## 2011-07-05 DIAGNOSIS — Z22322 Carrier or suspected carrier of Methicillin resistant Staphylococcus aureus: Secondary | ICD-10-CM | POA: Insufficient documentation

## 2011-07-05 DIAGNOSIS — I959 Hypotension, unspecified: Secondary | ICD-10-CM | POA: Diagnosis present

## 2011-07-05 DIAGNOSIS — K56600 Partial intestinal obstruction, unspecified as to cause: Secondary | ICD-10-CM

## 2011-07-05 DIAGNOSIS — D649 Anemia, unspecified: Secondary | ICD-10-CM

## 2011-07-05 DIAGNOSIS — E7800 Pure hypercholesterolemia, unspecified: Secondary | ICD-10-CM

## 2011-07-05 DIAGNOSIS — I4891 Unspecified atrial fibrillation: Secondary | ICD-10-CM

## 2011-07-05 DIAGNOSIS — R109 Unspecified abdominal pain: Secondary | ICD-10-CM | POA: Insufficient documentation

## 2011-07-05 LAB — POCT I-STAT, CHEM 8
BUN: 21 mg/dL (ref 6–23)
Calcium, Ion: 1.19 mmol/L (ref 1.12–1.32)
Chloride: 104 mEq/L (ref 96–112)
Glucose, Bld: 127 mg/dL — ABNORMAL HIGH (ref 70–99)

## 2011-07-05 LAB — BASIC METABOLIC PANEL
BUN: 20 mg/dL (ref 6–23)
Calcium: 9 mg/dL (ref 8.4–10.5)
GFR calc non Af Amer: 64 mL/min — ABNORMAL LOW (ref >90)
Glucose, Bld: 123 mg/dL — ABNORMAL HIGH (ref 70–99)
Sodium: 126 mEq/L — ABNORMAL LOW (ref 135–145)

## 2011-07-05 LAB — CBC
HCT: 37.3 % (ref 36.0–46.0)
Hemoglobin: 12.6 g/dL (ref 12.0–15.0)
MCH: 33.6 pg (ref 26.0–34.0)
MCHC: 33.8 g/dL (ref 30.0–36.0)
RDW: 15.4 % (ref 11.5–15.5)

## 2011-07-05 MED ORDER — NICOTINE 7 MG/24HR TD PT24
7.0000 mg | MEDICATED_PATCH | Freq: Every day | TRANSDERMAL | Status: DC
  Administered 2011-07-06 – 2011-07-07 (×2): 7 mg via TRANSDERMAL
  Filled 2011-07-05 (×2): qty 1

## 2011-07-05 MED ORDER — DILTIAZEM HCL ER 180 MG PO CP24
180.0000 mg | ORAL_CAPSULE | Freq: Every day | ORAL | Status: DC
  Filled 2011-07-05 (×2): qty 1

## 2011-07-05 MED ORDER — MORPHINE SULFATE 2 MG/ML IJ SOLN
2.0000 mg | INTRAMUSCULAR | Status: DC | PRN
  Administered 2011-07-05 – 2011-07-06 (×4): 2 mg via INTRAVENOUS
  Filled 2011-07-05 (×4): qty 1

## 2011-07-05 MED ORDER — HYDROMORPHONE HCL PF 1 MG/ML IJ SOLN
INTRAMUSCULAR | Status: AC
  Filled 2011-07-05: qty 1

## 2011-07-05 MED ORDER — HYDROMORPHONE HCL PF 1 MG/ML IJ SOLN
INTRAMUSCULAR | Status: AC
  Administered 2011-07-05: 0.5 mg via INTRAVENOUS
  Filled 2011-07-05: qty 1

## 2011-07-05 MED ORDER — SODIUM CHLORIDE 0.9 % IV BOLUS (SEPSIS)
1000.0000 mL | Freq: Once | INTRAVENOUS | Status: AC
  Administered 2011-07-05: 1000 mL via INTRAVENOUS

## 2011-07-05 MED ORDER — SODIUM CHLORIDE 0.9 % IV SOLN
INTRAVENOUS | Status: DC
  Administered 2011-07-06 – 2011-07-07 (×4): via INTRAVENOUS

## 2011-07-05 MED ORDER — FENTANYL CITRATE 0.05 MG/ML IJ SOLN
50.0000 ug | Freq: Once | INTRAMUSCULAR | Status: AC
  Administered 2011-07-05: 50 ug via INTRAVENOUS

## 2011-07-05 MED ORDER — ENOXAPARIN SODIUM 40 MG/0.4ML ~~LOC~~ SOLN
40.0000 mg | SUBCUTANEOUS | Status: DC
  Administered 2011-07-05 – 2011-07-07 (×2): 40 mg via SUBCUTANEOUS
  Filled 2011-07-05 (×4): qty 0.4

## 2011-07-05 MED ORDER — FENTANYL CITRATE 0.05 MG/ML IJ SOLN
INTRAMUSCULAR | Status: AC
  Administered 2011-07-05: 50 ug via INTRAVENOUS
  Filled 2011-07-05: qty 2

## 2011-07-05 MED ORDER — CYCLOBENZAPRINE HCL 10 MG PO TABS
10.0000 mg | ORAL_TABLET | ORAL | Status: DC | PRN
  Administered 2011-07-06: 10 mg via ORAL
  Filled 2011-07-05 (×2): qty 1

## 2011-07-05 MED ORDER — IOHEXOL 300 MG/ML  SOLN
100.0000 mL | Freq: Once | INTRAMUSCULAR | Status: AC | PRN
  Administered 2011-07-05: 100 mL via INTRAVENOUS

## 2011-07-05 MED ORDER — ONDANSETRON HCL 4 MG/2ML IJ SOLN
INTRAMUSCULAR | Status: AC
  Administered 2011-07-05: 17:00:00
  Filled 2011-07-05: qty 2

## 2011-07-05 MED ORDER — HYDROMORPHONE HCL PF 1 MG/ML IJ SOLN
0.5000 mg | Freq: Once | INTRAMUSCULAR | Status: AC
  Administered 2011-07-05: 0.5 mg via INTRAVENOUS
  Filled 2011-07-05: qty 1

## 2011-07-05 MED ORDER — HYDROMORPHONE HCL PF 1 MG/ML IJ SOLN
0.5000 mg | Freq: Once | INTRAMUSCULAR | Status: AC
  Administered 2011-07-05: 0.5 mg via INTRAVENOUS

## 2011-07-05 MED ORDER — ONDANSETRON HCL 4 MG/2ML IJ SOLN
4.0000 mg | Freq: Once | INTRAMUSCULAR | Status: AC
  Administered 2011-07-05: 4 mg via INTRAVENOUS
  Filled 2011-07-05: qty 2

## 2011-07-05 NOTE — ED Notes (Signed)
Ice chips given per Dr Patria Mane.

## 2011-07-05 NOTE — ED Provider Notes (Signed)
History     CSN: [id number]  Arrival date & time 07/05/11  1534   First MD Initiated Contact with Patient 07/05/11 1537      Chief Complaint  Patient presents with  . Abdominal Pain    per EMS pt in from home. pt c/o generalized abd pain. states was recently seen for same thing 6 weeks ago and diagnosed with ischemic colitis. pt vomiting pta.    Patient is a 61 y.o. female presenting with abdominal pain. The history is provided by the patient and medical records.  Abdominal Pain The primary symptoms of the illness include abdominal pain.   the patient reports developing acute onset generalized abdominal pain today with associated nausea and vomiting.  She had several large bowel movements associated with this discomfort as well.  She denies melena or hematochezia.  6 weeks ago she was hospitalized after colonoscopy demonstrated right-sided colitis concerning for ischemic colitis.  She was treated medically for this and has been doing well since discharge.  She has no history of diabetes.  She does have a history of paroxysmal atrial flutter and atrial fibrillation.  She is on anticoagulation.  She's currently in sinus rhythm.  She reports no recent palpitations chest pain or shortness of breath.  Nothing worsens her symptoms.  Nothing improves her symptoms.  Onset was acute.  She denies dysuria and urinary frequency.  She denies flank pain.  She denies chest pain shortness breath  Past Medical History  Diagnosis Date  . Hypertension     She has a past hx of essential  . 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 Inflammation of joint of knee     Since we last last saw her she developed problems with an acute which required surgical drainage by her orthopedist Dr. Cleophas Dunker.  Marland Kitchen MRSA (methicillin resistant Staphylococcus aureus)     Knee surgery drainage was positive for MRSA and she was treated with 3 weeks of  doxycycline successfully.  . Anemia     Past Hx  . Diarrhea     Mild  . Exogenous obesity   . Pneumonia   . Atrial flutter     Past Surgical History  Procedure Date  . Knee surgery   . Small intestine surgery   . Colonoscopy 05/16/2011    Procedure: COLONOSCOPY;  Surgeon: Vertell Novak., MD;  Location: [facility];  Service: Endoscopy;  Laterality: N/A;    History reviewed. No pertinent family history.  History  Substance Use Topics  . Smoking status: Current Some Day Smoker  . Smokeless tobacco: Not on file  . Alcohol Use: No    OB History    Grav Para Term Preterm Abortions TAB SAB Ect Mult Living                  Review of Systems  Gastrointestinal: Positive for abdominal pain.  All other systems reviewed and are negative.    Allergies  Codeine; Minitabs; Doxycycline; and Penicillins  Home Medications   Current Outpatient Rx  Name Route Sig Dispense Refill  . BENAZEPRIL HCL 20 MG PO TABS  take 1 tablet by mouth once daily 30 tablet PRN  . BISOPROLOL FUMARATE 5 MG PO TABS  1/2 tablet daily 15 tablet 3  . BUPROPION HCL ER (XL) 300 MG PO TB24 Oral Take 300 mg by mouth daily.     Marland Kitchen  CALCIUM + D PO Oral Take 500 mg by mouth daily.      . CELECOXIB 200 MG PO CAPS Oral Take 200 mg by mouth daily.    . CYCLOBENZAPRINE HCL 10 MG PO TABS Oral Take 10 mg by mouth as needed. Muscle spasms    . DILTIAZEM HCL ER 180 MG PO CP24 Oral Take 1 capsule (180 mg total) by mouth daily. 30 capsule 1  . ESTRADIOL 2 MG PO TABS Oral Take 2 mg by mouth daily.      Marland Kitchen HYDROCHLOROTHIAZIDE 25 MG PO TABS  Daily or as directed 30 tablet 11  . LORATADINE-PSEUDOEPHEDRINE ER 10-240 MG PO TB24 Oral Take 1 tablet by mouth daily. As needed    . MONTELUKAST SODIUM 10 MG PO TABS Oral Take 10 mg by mouth as needed. For breathing nasal drainage congestion relief    . MULTIVITAMIN PO Oral Take by mouth.        BP 91/60  Pulse 79  Temp(Src) 97.3 F (36.3 C) (Oral)  Resp 21  SpO2  100%  Physical Exam  Nursing note and vitals reviewed. Constitutional: She is oriented to person, place, and time. She appears well-developed and well-nourished. No distress.  HENT:  Head: Normocephalic and atraumatic.  Eyes: EOM are normal.  Neck: Normal range of motion.  Cardiovascular: Normal rate, regular rhythm and normal heart sounds.   Pulmonary/Chest: Effort normal and breath sounds normal.  Abdominal: Soft. She exhibits no distension.       Mild generalized abdominal tenderness without guarding or rebound.  No peritonitis on exam.  Musculoskeletal: Normal range of motion.  Neurological: She is alert and oriented to person, place, and time.  Skin: Skin is warm and dry.  Psychiatric: She has a normal mood and affect. Judgment normal.    ED Course  Procedures (including critical care time)  Labs Reviewed  CBC - Abnormal; Notable for the following:    RBC 3.75 (*)    Platelets 603 (*)    All other components within normal limits  BASIC METABOLIC PANEL - Abnormal; Notable for the following:    Sodium 126 (*)    Chloride 93 (*)    Glucose, Bld 123 (*)    GFR calc non Af Amer 64 (*)    GFR calc Af Amer 74 (*)    All other components within normal limits  POCT I-STAT, CHEM 8 - Abnormal; Notable for the following:    Sodium 133 (*)    Glucose, Bld 127 (*)    Hemoglobin 11.6 (*)    HCT 34.0 (*)    All other components within normal limits  PROTIME-INR  LACTIC ACID, PLASMA   Ct Abdomen Pelvis W Contrast  07/05/2011  *RADIOLOGY REPORT*  Clinical Data: Abdominal pain  CT ABDOMEN AND PELVIS WITH CONTRAST  Technique:  Multidetector CT imaging of the abdomen and pelvis was performed following the standard protocol during bolus administration of intravenous contrast.  Contrast: OMNIPAQUE IOHEXOL 300 MG/ML IJ SOLN  Comparison: 05/07/1928  Findings: There is no pericardial or pleural effusion identified.  Ground-glass opacities are identified within both lower lobes, right  greater than left.  No focal liver abnormalities.  The gallbladder appears normal.  The pancreas is negative.  Gastrohepatic ligament lymph node is borderline enlarged measuring 10 mm, image 22.  This is unchanged from previous exam.  Similar appearance of the porta caval lymph node measures 1.6 cm.  Normal appearance of the adrenal glands.  The spleen is normal.  Normal appearance of both kidneys.  No pelvic or inguinal adenopathy.  The stomach appears normal.  There is enteroenteric anastomoses within the lower abdomen, centrally.  There is focal dilatation within the surrounding bowel loops which measure up to 3.6 cm.  The terminal ileum appears collapsed.  There is moderate distention of the colon with moderate retained stool up to the level of the rectum.  Mild wall thickening of the transverse colon is identified.  No significant pericolonic inflammatory change or free fluid.  Review of the visualized osseous structures shows a scoliosis deformity with multilevel degenerative disc disease. Anterolisthesis of L4 on L5 is noted.  IMPRESSION:  1.  Partial small bowel obstruction is identified within the central abdomen near the enteroenteric anastomoses. There are no specific features identified to suggest high-grade bowel obstruction. 2.  Mild wall thickening involving the transverse colon likely resolving colitis.  Negative for perforation or abscess formation. 3.  There is moderate retention of stool identified throughout the distal colon. 4.  Resolving bilateral lower lobe pneumonias with residual ground- glass attenuation consistent with alveolitis.  Original Report Authenticated By: Rosealee Albee, M.D.   Dg Abd 2 Views  07/05/2011  *RADIOLOGY REPORT*  Clinical Data: Abdominal pain.  Vomiting.  ABDOMEN - 2 VIEW  Comparison: Abdominal radiograph 05/09/2011.  Findings: There is gas and stool scattered throughout the colon extending to the level of the distal rectum.  A mildly dilated loop of small bowel  as noted up to 3.5 cm.  Lateral view demonstrates several air-fluid levels, some within the colon, and some within the small bowel.  No definite pneumoperitoneum.  A suture line is seen in the low anatomic pelvis on the left. Levoscoliosis of the lumbar spine with advanced degenerative disc disease at multiple levels, incidentally noted.  IMPRESSION: 1.  Nonspecific bowel gas pattern, as detailed above, concerning for potential early or partial small bowel obstruction. 2.  No pneumoperitoneum.  Original Report Authenticated By: Florencia Reasons, M.D.   I personally reviewed her CT scan  1. Partial small bowel obstruction       MDM  Patient with generalized abdominal tenderness.  Plain films and CT scan pending at this time.  Her pain is improving.  She was hypertensive on arrival and responded quickly to a liter and a half of fluids without blood pressure 900 systolic.  will continue to monitor the patient closely  7:51 PM The patient is feeling better at this time.  Her CT scan demonstrates a partial small bowel obstruction without perforation.  She appears to have resolving colitis.  He'll contact the [facility]ad hospitalist for admission.  Her systolic blood pressures 106 at this time        Lyanne Co, MD 07/05/11 [phone]

## 2011-07-05 NOTE — H&P (Signed)
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 She was in her usual state of health when she started having acute crampy lower abdominal pain which as diffuse 10/10 in severity without any aggravating or relieving factor. She was nauseous and had a small amount of vomit which contained food particles. She denies fever, chills, diarrhea or urinary symptoms. He had a normal bowel movement earlier today. In the ED she was noted to have low BP ( SBP in 85) and given IV NS bolus after which her BP improved. She ws also noted have sats in 80s but it was due to poor sensing of the pulse oxy meter.  Patient was given some dilaudid in the ED after which her pain was much improved. A CT abdomen and pelvis in ED showed partial small bowel obstruction. And planned to admit for observation. patient informs having similar abdominal pain symptoms in past which would subside on its own after being on clears for several days.   Allergies: Allergies  Allergen Reactions  . Codeine Nausea And Vomiting  . Tetanus Toxoids Other (See Comments)    serum  . Doxycycline Rash  . Penicillins Hives and Rash    Prior to Admission medications   Medication Sig Start Date End Date Taking? Authorizing Provider  amLODipine (NORVASC) 5 MG tablet Take 5 mg by mouth daily.   Yes Historical Provider, MD  benazepril (LOTENSIN) 20 MG tablet take 1 tablet by mouth once daily 06/15/11  Yes Cassell Clement, MD  bisoprolol (ZEBETA) 5 MG tablet 1/2 tablet daily 06/15/11  Yes Cassell Clement, MD  buPROPion (WELLBUTRIN XL) 300 MG 24 hr tablet Take 300 mg by mouth  daily.    Yes Historical Provider, MD  Calcium Carbonate-Vitamin D (CALCIUM + D PO) Take 500 mg by mouth daily.     Yes Historical Provider, MD  celecoxib (CELEBREX) 200 MG capsule Take 200 mg by mouth daily.   Yes Historical Provider, MD  cyclobenzaprine (FLEXERIL) 10 MG tablet Take 10 mg by mouth as needed. Muscle spasms   Yes Historical Provider, MD  diltiazem (DILACOR XR) 180 MG 24 hr capsule Take 1 capsule (180 mg total) by mouth daily. 05/20/11 05/19/12 Yes Estela Isaiah Blakes, MD  estradiol (ESTRACE) 2 MG tablet Take 2 mg by mouth daily.     Yes Historical Provider, MD  hydrochlorothiazide (HYDRODIURIL) 25 MG tablet 25 mg daily. Daily or as directed 08/10/10  Yes Cassell Clement, MD  loratadine-pseudoephedrine (CLARITIN-D 24-HOUR) 10-240 MG per 24 hr tablet Take 1 tablet by mouth daily. As needed for allergy   Yes Historical Provider, MD  montelukast (SINGULAIR) 10 MG tablet Take 10 mg by mouth as needed. For breathing nasal drainage congestion relief   Yes Historical Provider, MD  Multiple Vitamin (MULTIVITAMIN PO) Take by mouth.     Yes Historical Provider, MD    Past Medical History  Diagnosis Date  . Hypertension     She has a past hx of essential  . Elevated liver function tests     She also has a past hx of chronically studies felt to  be secondary to Celebrex  . Inflammation of joint of knee     Since we last last saw her she developed problems with an acute which required surgical drainage by her orthopedist Dr. Cleophas Dunker.  Marland Kitchen MRSA (methicillin resistant Staphylococcus aureus)     Knee surgery drainage was positive for MRSA and she was treated with 3 weeks of doxycycline successfully.  . Anemia     Past Hx  . Diarrhea     Mild  . Exogenous obesity   . Pneumonia   . Atrial flutter     Past Surgical History  Procedure Date  . Knee surgery   . Small intestine surgery   . Colonoscopy 05/16/2011    Procedure: COLONOSCOPY;  Surgeon: Vertell Novak., MD;  Location: [facility];  Service: Endoscopy;  Laterality: N/A;    Social History:  reports that she has been smoking.  She has never used smokeless tobacco. She reports that she does not drink alcohol or use illicit drugs.  History reviewed. No pertinent family history.  Review of Systems:  Constitutional: Denies fever, chills, diaphoresis, appetite change and fatigue.  HEENT: Denies photophobia, eye pain, redness, hearing loss, ear pain, congestion, sore throat, rhinorrhea, sneezing, mouth sores, trouble swallowing, neck pain, neck stiffness and tinnitus.   Respiratory: Denies SOB, DOE, cough, chest tightness,  and wheezing.   Cardiovascular: Denies chest pain, palpitations and leg swelling.  Gastrointestinal: lower abdominal pain and  nausea with one episode of vomiting, denies  diarrhea, constipation, blood in stool and abdominal distention.  Genitourinary: Denies dysuria, urgency, frequency, hematuria, flank pain and difficulty urinating.  Musculoskeletal: Denies myalgias, back pain, joint swelling, arthralgias and gait problem.  Skin: Denies pallor, rash and wound.  Neurological: Denies dizziness, seizures, syncope, weakness, light-headedness, numbness and headaches.  Hematological: Denies adenopathy. Easy bruising, personal or family bleeding history  Psychiatric/Behavioral: Denies suicidal ideation, mood changes, confusion, nervousness, sleep disturbance and agitation   Physical Exam:  Filed Vitals:   07/05/11 1640 07/05/11 1706 07/05/11 1827 07/05/11 2001  BP: 91/60 107/57 117/65 110/64  Pulse: 79 78 82 85  Temp:      TempSrc:      Resp: 21 16 15 22   SpO2:   97% 100%    Constitutional: Vital signs reviewed.  Patient is a well-developed and well-nourished in no acute distress and cooperative with exam. Alert and oriented x3.  Head: Normocephalic and atraumatic Ear: TM normal bilaterally Mouth: no erythema or exudates, MMM Eyes: PERRL, EOMI, conjunctivae normal, No scleral icterus.    Neck: Supple, Trachea midline normal ROM, No JVD, mass, thyromegaly, or carotid bruit present.  Cardiovascular: RRR, S1 normal, S2 normal, no MRG, pulses symmetric and intact bilaterally Pulmonary/Chest: CTAB, no wheezes, rales, or rhonchi Abdominal: midline laprotomy scar, Soft. Tender to palpation over lower quadrant,  non-distended, bowel sounds sluggish, no masses, organomegaly, or guarding present.  GU: no CVA tenderness Musculoskeletal: No joint deformities, erythema, or stiffness, ROM full and no nontender Ext: no edema and no cyanosis, pulses palpable bilaterally (DP and PT) Hematology: no cervical, inginal, or axillary adenopathy.  Neurological: A&O x3, Strenght is normal and symmetric bilaterally, cranial nerve II-XII are grossly intact, no focal motor deficit, sensory intact to light touch bilaterally.  Skin: Warm, dry and intact. No rash, cyanosis, or clubbing.  Psychiatric: Normal mood and affect. speech and behavior is normal. Judgment and thought content normal. Cognition and memory are normal.   Labs on Admission:  Results for orders placed during the hospital  encounter of 07/05/11 (from the past 48 hour(s))  LACTIC ACID, PLASMA     Status: Normal   Collection Time   07/05/11  4:13 PM      Component Value Range Comment   Lactic Acid, Venous 1.5  0.5 - 2.2 (mmol/L)   CBC     Status: Abnormal   Collection Time   07/05/11  4:22 PM      Component Value Range Comment   WBC 4.5  4.0 - 10.5 (K/uL)    RBC 3.75 (*) 3.87 - 5.11 (MIL/uL)    Hemoglobin 12.6  12.0 - 15.0 (g/dL)    HCT 14.7  82.9 - 56.2 (%)    MCV 99.5  78.0 - 100.0 (fL)    MCH 33.6  26.0 - 34.0 (pg)    MCHC 33.8  30.0 - 36.0 (g/dL)    RDW 13.0  86.5 - 78.4 (%)    Platelets 603 (*) 150 - 400 (K/uL)   BASIC METABOLIC PANEL     Status: Abnormal   Collection Time   07/05/11  4:22 PM      Component Value Range Comment   Sodium 126 (*) 135 - 145 (mEq/L)    Potassium 4.1  3.5 - 5.1 (mEq/L)    Chloride 93 (*) 96 - 112  (mEq/L)    CO2 20  19 - 32 (mEq/L)    Glucose, Bld 123 (*) 70 - 99 (mg/dL)    BUN 20  6 - 23 (mg/dL)    Creatinine, Ser 6.96  0.50 - 1.10 (mg/dL)    Calcium 9.0  8.4 - 10.5 (mg/dL)    GFR calc non Af Amer 64 (*) >90 (mL/min)    GFR calc Af Amer 74 (*) >90 (mL/min)   PROTIME-INR     Status: Normal   Collection Time   07/05/11  4:22 PM      Component Value Range Comment   Prothrombin Time 12.8  11.6 - 15.2 (seconds)    INR 0.94  0.00 - 1.49    POCT I-STAT, CHEM 8     Status: Abnormal   Collection Time   07/05/11  4:44 PM      Component Value Range Comment   Sodium 133 (*) 135 - 145 (mEq/L)    Potassium 4.3  3.5 - 5.1 (mEq/L)    Chloride 104  96 - 112 (mEq/L)    BUN 21  6 - 23 (mg/dL)    Creatinine, Ser 2.95  0.50 - 1.10 (mg/dL)    Glucose, Bld 284 (*) 70 - 99 (mg/dL)    Calcium, Ion 1.32  1.12 - 1.32 (mmol/L)    TCO2 20  0 - 100 (mmol/L)    Hemoglobin 11.6 (*) 12.0 - 15.0 (g/dL)    HCT 44.0 (*) 10.2 - 46.0 (%)     Radiological Exams on Admission: CT ABDOMEN AND PELVIS WITH CONTRAST  Technique: Multidetector CT imaging of the abdomen and pelvis was  performed following the standard protocol during bolus  administration of intravenous contrast.  Contrast: OMNIPAQUE IOHEXOL 300 MG/ML IJ SOLN  Comparison: 05/07/1928  Findings: There is no pericardial or pleural effusion identified.  Ground-glass opacities are identified within both lower lobes,  right greater than left.  No focal liver abnormalities.  The gallbladder appears normal.  The pancreas is negative.  Gastrohepatic ligament lymph node is borderline enlarged measuring  10 mm, image 22. This is unchanged from previous exam. Similar  appearance of the porta caval lymph node measures  1.6 cm.  Normal appearance of the adrenal glands.  The spleen is normal.  Normal appearance of both kidneys. No pelvic or inguinal  adenopathy.  The stomach appears normal.  There is enteroenteric anastomoses within the lower  abdomen,  centrally. There is focal dilatation within the surrounding bowel  loops which measure up to 3.6 cm. The terminal ileum appears  collapsed. There is moderate distention of the colon with moderate  retained stool up to the level of the rectum.  Mild wall thickening of the transverse colon is identified. No  significant pericolonic inflammatory change or free fluid.  Review of the visualized osseous structures shows a scoliosis  deformity with multilevel degenerative disc disease.  Anterolisthesis of L4 on L5 is noted.  IMPRESSION:  1. Partial small bowel obstruction is identified within the  central abdomen near the enteroenteric anastomoses. There are no  specific features identified to suggest high-grade bowel  obstruction.  2. Mild wall thickening involving the transverse colon likely  resolving colitis. Negative for perforation or abscess formation.  3. There is moderate retention of stool identified throughout the  distal colon.  4. Resolving bilateral lower lobe pneumonias with residual ground-  glass attenuation consistent with alveolitis.   Assessment 60 y/o female with hx of HTN, afib ( not on coumadin), recent hx of ischemic colitis ( medically managed) , hx of remote laparotomy presents with 1 day hx of lower abdominal pain with findings of partial small bowel obstruction .   Plan  *Partial small bowel obstruction Admit to observation Keep NPO except ice chips and meds Serial abdominal exam Labs stable. No signs of ischemic bowel clinically. No AG, normal lactic acid.  monitor lytes in am Will hydrate her with IV NS Pain control with prn morphine -will hold off on surgical consult unless symptoms persistent or worsen tomorrow.    Hypotension Presented with SBP in 80s, improved with IV NS bolus given in ED. Continue IV hydration with NS Hold BP meds  afib  on sinus rhythm at present] resume cardizem tomorrow am if BP stable   DVT prophylaxis   Full  code  Time Spent on Admission: 50 minutes  Jacqulyn Barresi 07/05/2011, 9:06 PM

## 2011-07-05 NOTE — ED Notes (Signed)
[id number]<AV> Expected date:07/05/11<BR> Expected time: 3:21 PM<BR> Means of arrival:Ambulance<BR> Comments:<BR> Abdominal pain

## 2011-07-05 NOTE — ED Notes (Signed)
Patient returned from X-ray 

## 2011-07-06 LAB — BASIC METABOLIC PANEL
BUN: 21 mg/dL (ref 6–23)
CO2: 18 mEq/L — ABNORMAL LOW (ref 19–32)
Calcium: 8.1 mg/dL — ABNORMAL LOW (ref 8.4–10.5)
Creatinine, Ser: 0.85 mg/dL (ref 0.50–1.10)
Glucose, Bld: 120 mg/dL — ABNORMAL HIGH (ref 70–99)

## 2011-07-06 MED ORDER — MONTELUKAST SODIUM 10 MG PO TABS
10.0000 mg | ORAL_TABLET | ORAL | Status: DC | PRN
  Filled 2011-07-06: qty 1

## 2011-07-06 MED ORDER — LORATADINE-PSEUDOEPHEDRINE ER 10-240 MG PO TB24: 1.0000 | ORAL_TABLET | Freq: Every day | ORAL | Status: DC

## 2011-07-06 MED ORDER — BUPROPION HCL ER (XL) 300 MG PO TB24
300.0000 mg | ORAL_TABLET | Freq: Every day | ORAL | Status: DC
  Administered 2011-07-06 – 2011-07-07 (×2): 300 mg via ORAL
  Filled 2011-07-06 (×2): qty 1

## 2011-07-06 MED ORDER — HYDROCODONE-ACETAMINOPHEN 5-325 MG PO TABS
1.0000 | ORAL_TABLET | Freq: Four times a day (QID) | ORAL | Status: DC | PRN
  Administered 2011-07-06: 1 via ORAL
  Administered 2011-07-06 – 2011-07-07 (×2): 2 via ORAL
  Administered 2011-07-07: 1 via ORAL
  Filled 2011-07-06: qty 1
  Filled 2011-07-06 (×2): qty 2
  Filled 2011-07-06: qty 1

## 2011-07-06 MED ORDER — LORATADINE 10 MG PO TABS
10.0000 mg | ORAL_TABLET | Freq: Every day | ORAL | Status: DC
  Administered 2011-07-06 – 2011-07-07 (×2): 10 mg via ORAL
  Filled 2011-07-06 (×2): qty 1

## 2011-07-06 MED ORDER — PSEUDOEPHEDRINE HCL ER 120 MG PO TB12
120.0000 mg | ORAL_TABLET | Freq: Two times a day (BID) | ORAL | Status: DC
  Administered 2011-07-07: 120 mg via ORAL
  Filled 2011-07-06 (×3): qty 1

## 2011-07-06 MED ORDER — ESTRADIOL 2 MG PO TABS
2.0000 mg | ORAL_TABLET | Freq: Every day | ORAL | Status: DC
  Administered 2011-07-06 – 2011-07-07 (×2): 2 mg via ORAL
  Filled 2011-07-06 (×2): qty 1

## 2011-07-06 NOTE — ED Notes (Signed)
Laural Benes, MD returned page will see patient as soon as possible. Family notified

## 2011-07-06 NOTE — ED Notes (Signed)
Laural Benes, MD re-paged

## 2011-07-06 NOTE — ED Notes (Signed)
Dietary called for jello per pt request

## 2011-07-06 NOTE — ED Notes (Signed)
Dietary called for clear liquid tray. Pt still waiting for jello

## 2011-07-06 NOTE — Progress Notes (Signed)
[facility] Inpatient Progress Note  07/06/2011  Subjective: Pt has reported that she is starting to feel better.  She is having much less abdominal pain.  She is wanting to try clears and advance diet.  She says that she had 2 large bowel movements.   Objective:  Vital signs in last 24 hours: Filed Vitals:   07/06/11 0008 07/06/11 0545 07/06/11 0822 07/06/11 1206  BP:  111/59 110/82 106/56  Pulse: 89 86 91 106  Temp:  97.9 F (36.6 C)  98.6 F (37 C)  TempSrc:  Oral  Oral  Resp:    20  SpO2: 97% 99% 99%    Weight change:   Intake/Output Summary (Last 24 hours) at 07/06/11 1508 Last data filed at 07/05/11 2125  Gross per 24 hour  Intake      0 ml  Output      1 ml  Net     -1 ml   No results found for this basename: HGBA1C   Lab Results  Component Value Date   [facility]  Value: 115        Total Cholesterol/HDL:CHD Risk Coronary Heart Disease Risk Table                     Men   Women  1/2 Average Risk   3.4   3.3* 07/20/2007   CREATININE 0.85 07/06/2011    Review of Systems As above, otherwise all reviewed and reported negative  Physical Exam General:. Patient is a well-developed and well-nourished in no acute distress and cooperative with exam. Alert and oriented x3.  Head: Normocephalic and atraumatic  Mouth: no erythema or exudates, MMM  Eyes: PERRL, EOMI, conjunctivae normal, No scleral icterus.  Neck: Supple, Trachea midline normal ROM, No JVD, mass, thyromegaly, or carotid bruit present.  Cardiovascular: RRR, S1 normal, S2 normal, no MRG, pulses symmetric and intact bilaterally  Pulmonary/Chest: CTAB, no wheezes, rales, or rhonchi  Abdominal: midline laprotomy scar, Soft, non-distended, normal BS, no masses, organomegaly, or guarding present.  Ext: no edema and no cyanosis, pulses palpable bilaterally Neurological: A&O x3, Strenght is normal and symmetric bilaterally, cranial nerve II-XII are grossly intact, no focal motor deficit, sensory intact to light  touch bilaterally.  Skin: Warm, dry and intact. No rash, cyanosis, or clubbing.  Psychiatric: Normal mood and affect. speech and behavior is normal. Judgment and thought content normal. Cognition and memory are normal.   Lab Results: Results for orders placed during the hospital encounter of 07/05/11 (from the past 24 hour(s))  LACTIC ACID, PLASMA     Status: Normal   Collection Time   07/05/11  4:13 PM      Component Value Range   Lactic Acid, Venous 1.5  0.5 - 2.2 (mmol/L)  CBC     Status: Abnormal   Collection Time   07/05/11  4:22 PM      Component Value Range   WBC 4.5  4.0 - 10.5 (K/uL)   RBC 3.75 (*) 3.87 - 5.11 (MIL/uL)   Hemoglobin 12.6  12.0 - 15.0 (g/dL)   HCT 21.3  08.6 - 57.8 (%)   MCV 99.5  78.0 - 100.0 (fL)   MCH 33.6  26.0 - 34.0 (pg)   MCHC 33.8  30.0 - 36.0 (g/dL)   RDW 46.9  62.9 - 52.8 (%)   Platelets 603 (*) 150 - 400 (K/uL)  BASIC METABOLIC PANEL     Status: Abnormal   Collection Time   07/05/11  4:22 PM      Component Value Range   Sodium 126 (*) 135 - 145 (mEq/L)   Potassium 4.1  3.5 - 5.1 (mEq/L)   Chloride 93 (*) 96 - 112 (mEq/L)   CO2 20  19 - 32 (mEq/L)   Glucose, Bld 123 (*) 70 - 99 (mg/dL)   BUN 20  6 - 23 (mg/dL)   Creatinine, Ser 2.95  0.50 - 1.10 (mg/dL)   Calcium 9.0  8.4 - 62.1 (mg/dL)   GFR calc non Af Amer 64 (*) >90 (mL/min)   GFR calc Af Amer 74 (*) >90 (mL/min)  PROTIME-INR     Status: Normal   Collection Time   07/05/11  4:22 PM      Component Value Range   Prothrombin Time 12.8  11.6 - 15.2 (seconds)   INR 0.94  0.00 - 1.49   POCT I-STAT, CHEM 8     Status: Abnormal   Collection Time   07/05/11  4:44 PM      Component Value Range   Sodium 133 (*) 135 - 145 (mEq/L)   Potassium 4.3  3.5 - 5.1 (mEq/L)   Chloride 104  96 - 112 (mEq/L)   BUN 21  6 - 23 (mg/dL)   Creatinine, Ser 3.08  0.50 - 1.10 (mg/dL)   Glucose, Bld 657 (*) 70 - 99 (mg/dL)   Calcium, Ion 8.46  9.62 - 1.32 (mmol/L)   TCO2 20  0 - 100 (mmol/L)   Hemoglobin 11.6  (*) 12.0 - 15.0 (g/dL)   HCT 95.2 (*) 84.1 - 46.0 (%)  BASIC METABOLIC PANEL     Status: Abnormal   Collection Time   07/06/11  5:00 AM      Component Value Range   Sodium 127 (*) 135 - 145 (mEq/L)   Potassium 3.9  3.5 - 5.1 (mEq/L)   Chloride 99  96 - 112 (mEq/L)   CO2 18 (*) 19 - 32 (mEq/L)   Glucose, Bld 120 (*) 70 - 99 (mg/dL)   BUN 21  6 - 23 (mg/dL)   Creatinine, Ser 3.24  0.50 - 1.10 (mg/dL)   Calcium 8.1 (*) 8.4 - 10.5 (mg/dL)   GFR calc non Af Amer 72 (*) >90 (mL/min)   GFR calc Af Amer 83 (*) >90 (mL/min)    Micro Results: No results found for this or any previous visit (from the past 240 hour(s)).  Medications:  Scheduled Meds:   . diltiazem  180 mg Oral Daily  . enoxaparin  40 mg Subcutaneous Q24H  . fentaNYL  50 mcg Intravenous Once  .  HYDROmorphone (DILAUDID) injection  0.5 mg Intravenous Once  .  HYDROmorphone (DILAUDID) injection  0.5 mg Intravenous Once  . nicotine  7 mg Transdermal Daily  . ondansetron      . ondansetron (ZOFRAN) IV  4 mg Intravenous Once  . sodium chloride  1,000 mL Intravenous Once  . sodium chloride  1,000 mL Intravenous Once   Continuous Infusions:   . sodium chloride 100 mL/hr at 07/06/11 1043   PRN Meds:.cyclobenzaprine, iohexol, morphine  Assessment/Plan:  *Partial small bowel obstruction  Symptoms improving, start clears diet and advance as tolerated Labs stable. No signs of ischemic bowel clinically. No AG, normal lactic acid.  monitor lytes . Will hydrate her with IV NS  Pain control with prn morphine  -will hold off on surgical consult unless symptoms persistent or worsen  Constipation Pt had 2 large BMs and is feeling much better.  Hypotension - resolved now after IVFs Presented with SBP in 80s, improved with IV NS bolus given in ED. Continue IV hydration with NS   afib  on sinus rhythm at present resume cardizem today DVT prophylaxis  Hyponatremia - chronic per patient, holding HCTZ at this time. Following  lytes.   LOS: 1 day   Farooq Petrovich 07/06/2011, 3:08 PM   Cleora Fleet, MD, CDE, FAAFP [facility] [place], [place]  [phone]

## 2011-07-06 NOTE — ED Notes (Signed)
Pt provided coke per request.

## 2011-07-06 NOTE — ED Notes (Signed)
Team 5 Laural Benes, MD) paged.

## 2011-07-06 NOTE — ED Notes (Signed)
Laural Benes, MD arrived to see pt

## 2011-07-06 NOTE — ED Notes (Signed)
Pt provided popsicle while awaiting meal tray.

## 2011-07-06 NOTE — ED Notes (Signed)
Johnson, MD at bedside.

## 2011-07-06 NOTE — ED Notes (Signed)
Pt alert and oriented x4. Respirations even and unlabored, bilateral symmetrical rise and fall of chest. Skin warm and dry. In no acute distress. Denies needs. Pt reports she has a caffeine headache and requested a diet coke, but rn educated about the importance of pt staying NPO status and how pt is only allowed ice chips at present.

## 2011-07-06 NOTE — ED Notes (Signed)
Laural Benes, MD repaged per pt and family request

## 2011-07-07 LAB — OSMOLALITY, URINE: Osmolality, Ur: 221 mOsm/kg — ABNORMAL LOW (ref 390–1090)

## 2011-07-07 MED ORDER — AMLODIPINE BESYLATE 5 MG PO TABS
5.0000 mg | ORAL_TABLET | Freq: Every day | ORAL | Status: DC
  Administered 2011-07-07: 5 mg via ORAL
  Filled 2011-07-07: qty 1

## 2011-07-07 MED ORDER — BISOPROLOL FUMARATE 5 MG PO TABS
2.5000 mg | ORAL_TABLET | Freq: Every day | ORAL | Status: DC
  Administered 2011-07-07: 2.5 mg via ORAL
  Filled 2011-07-07: qty 0.5

## 2011-07-07 MED ORDER — BENAZEPRIL HCL 20 MG PO TABS
20.0000 mg | ORAL_TABLET | Freq: Every day | ORAL | Status: DC
  Administered 2011-07-07: 20 mg via ORAL
  Filled 2011-07-07: qty 1

## 2011-07-07 MED ORDER — PANTOPRAZOLE SODIUM 40 MG PO TBEC
40.0000 mg | DELAYED_RELEASE_TABLET | Freq: Every day | ORAL | Status: DC
  Administered 2011-07-07: 40 mg via ORAL
  Filled 2011-07-07: qty 1

## 2011-07-07 MED ORDER — CHLORHEXIDINE GLUCONATE CLOTH 2 % EX PADS
6.0000 | MEDICATED_PAD | Freq: Every day | CUTANEOUS | Status: DC
  Administered 2011-07-07: 6 via TOPICAL

## 2011-07-07 MED ORDER — MUPIROCIN 2 % EX OINT: 1.0000 "application " | TOPICAL_OINTMENT | Freq: Two times a day (BID) | CUTANEOUS | Status: AC

## 2011-07-07 MED ORDER — MUPIROCIN 2 % EX OINT
1.0000 "application " | TOPICAL_OINTMENT | Freq: Two times a day (BID) | CUTANEOUS | Status: DC
  Administered 2011-07-07: 1 via NASAL
  Filled 2011-07-07: qty 22

## 2011-07-07 MED ORDER — DOCUSATE SODIUM 100 MG PO CAPS: 100.0000 mg | ORAL_CAPSULE | Freq: Two times a day (BID) | ORAL | Status: AC

## 2011-07-07 NOTE — Progress Notes (Signed)
Dr. Laural Benes, C. Notified re; positive MRSA swab

## 2011-07-07 NOTE — Discharge Summary (Signed)
HOSPITAL DISCHARGE SUMMARY   @n   [name], 61 y.o., DOB June 10, 1950, MRN [id number]  Admission date: 07/05/2011 Discharge Date: 07/07/2011  Primary MD Cassell Clement, MD, MD  Admitting Physician Cleora Fleet, MD  Admission Diagnosis  PAF (paroxysmal atrial fibrillation) [427.31] Partial small bowel obstruction [560.9] pain  Discharge Diagnoses:   No resolved problems to display.  Active Hospital Problems  Diagnoses Date Noted   . Partial small bowel obstruction 07/05/2011   . Atrial fibrillation 07/05/2011   . Chronic ischemic colitis 07/05/2011   . Hypotension 07/05/2011     Class: Acute  . Chronic back pain 05/08/2011     Resolved Hospital Problems  Diagnoses Date Noted Date Resolved    Past Medical History  Diagnosis Date  . Hypertension     She has a past hx of essential  . 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 Inflammation of joint of knee     Since we last last saw her she developed problems with an acute which required surgical drainage by her orthopedist Dr. Cleophas Dunker.  Marland Kitchen MRSA (methicillin resistant Staphylococcus aureus)     Knee surgery drainage was positive for MRSA and she was treated with 3 weeks of doxycycline successfully.  . Anemia     Past Hx  . Diarrhea     Mild  . Exogenous obesity   . Pneumonia   . Atrial flutter     Past Surgical History  Procedure Date  . Knee surgery   . Small intestine surgery   . Colonoscopy 05/16/2011    Procedure: COLONOSCOPY;  Surgeon: Vertell Novak., MD;  Location: [facility];  Service: Endoscopy;  Laterality: N/A;     Significant Tests:  See full reports for all details   CT ABDOMEN AND PELVIS WITH CONTRAST  Technique: Multidetector CT imaging of the abdomen and pelvis was  performed following the standard protocol during bolus  administration of intravenous contrast.  Contrast: OMNIPAQUE IOHEXOL 300 MG/ML IJ  SOLN  Comparison: 05/07/1928  Findings: There is no pericardial or pleural effusion identified.  Ground-glass opacities are identified within both lower lobes,  right greater than left.  No focal liver abnormalities.  The gallbladder appears normal.  The pancreas is negative.  Gastrohepatic ligament lymph node is borderline enlarged measuring  10 mm, image 22. This is unchanged from previous exam. Similar  appearance of the porta caval lymph node measures 1.6 cm.  Normal appearance of the adrenal glands.  The spleen is normal.  Normal appearance of both kidneys. No pelvic or inguinal  adenopathy.  The stomach appears normal.  There is enteroenteric anastomoses within the lower abdomen,  centrally. There is focal dilatation within the surrounding bowel  loops which measure up to 3.6 cm. The terminal ileum appears  collapsed. There is moderate distention of the colon with moderate  retained stool up to the level of the rectum.  Mild wall thickening of the transverse colon is identified. No  significant pericolonic inflammatory change or free fluid.  Review of the visualized osseous structures shows a scoliosis  deformity with multilevel degenerative disc disease.  Anterolisthesis of L4 on L5 is noted.  IMPRESSION:  1. Partial small bowel obstruction is identified within the  central abdomen near the enteroenteric anastomoses. There are no  specific features identified to suggest high-grade bowel  obstruction.  2. Mild wall thickening involving the transverse  colon likely  resolving colitis. Negative for perforation or abscess formation.  3. There is moderate retention of stool identified throughout the  distal colon.  4. Resolving bilateral lower lobe pneumonias with residual ground-  glass attenuation consistent with alveolitis.  Original Report Authenticated By: Rosealee Albee, M.D.   Hospital Course See H&P, Labs, Consult and Test reports for all details. 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 She was in her usual state of health when she started having acute crampy lower abdominal pain which as diffuse 10/10 in severity without any aggravating or relieving factor. She was nauseous and had a small amount of vomit which contained food particles. She denies fever, chills, diarrhea or urinary symptoms. He had a normal bowel movement earlier today. In the ED she was noted to have low BP ( SBP in 85) and given IV NS bolus after which her BP improved. She ws also noted have sats in 80s but it was due to poor sensing of the pulse oxy meter.  Patient was given some dilaudid in the ED after which her pain was much improved. A CT abdomen and pelvis in ED showed partial small bowel obstruction. And planned to admit for observation. patient informs having similar abdominal pain symptoms in past which would subside on its own after being on clears for several days.   Pt was placed on bowel rest and IVFs and responded very well with a large BM x 2 and subsequently started on clears and advanced to regular diet and responded well.  No problems reported.  No nausea or recurrent abdominal pain reported.  Pt was asking for home and felt pt was stable for dc home with follow up with her PCP and I asked her to follow up with a surgeon as well.  Pt had hyponatremia thought to be secondary to chronic HCTZ usage.  I stopped that and asked pt to see her PCP before restarting this.  In fact, a lower dose may be appropriate for restarting in future.    No resolved problems to display.  Active Hospital Problems  Diagnoses Date Noted   . Partial small bowel obstruction 07/05/2011   . Atrial fibrillation  07/05/2011   . Chronic ischemic colitis 07/05/2011   . Hypotension 07/05/2011     Class: Acute  . Chronic back pain 05/08/2011    Pt colonized with MRSA  Resolved Hospital Problems  Diagnoses Date Noted Date Resolved     Today's Assessment:   Subjective:   [name] Pt tolerated breakfast very well.  Pt asking to go home.    Objective:   Blood pressure 154/85, pulse 104, temperature 98.8 F (37.1 C), temperature source Oral, resp. rate 18, height 5\' 6"  (1.676 m), weight 84.596 kg (186 lb 8 oz), SpO2 98.00%.  Intake/Output Summary (Last 24 hours) at 07/07/11 1205 Last data filed at 07/07/11 0825  Gross per 24 hour  Intake    240 ml  Output    400 ml  Net   -160 ml    Exam Gen - awake, alert, NAD Lungs - BBS, CTA CV - normal s1, s2 sounds Abd - soft, nondistended, no masses palpated Ext - no cyanosis or  clubbing. Neuro - no focal deficits.    Lab Results  Component Value Date   WBC 4.5 07/05/2011   HGB 11.6* 07/05/2011   HCT 34.0* 07/05/2011   PLT 603* 07/05/2011   LYMPHOPCT 8* 05/07/2011   MONOPCT 14* 05/07/2011   EOSPCT 0 05/07/2011   BASOPCT 0 05/07/2011   CMP: Lab Results  Component Value Date   NA 127* 07/06/2011   K 3.9 07/06/2011   CL 99 07/06/2011   CO2 18* 07/06/2011   BUN 21 07/06/2011   CREATININE 0.85 07/06/2011   PROT 5.5* 05/20/2011   ALBUMIN 2.6* 05/20/2011   BILITOT 0.3 05/20/2011   ALKPHOS 143* 05/20/2011   AST 22 05/20/2011   ALT 12 05/20/2011  .  Discharge Instructions    Have BMP rechecked in 1 week with PCP See DC AVS form   DISCHARGE MEDICATION: Medication List  As of 07/07/2011 12:05 PM   STOP taking these medications         hydrochlorothiazide 25 MG tablet         TAKE these medications         amLODipine 5 MG tablet   Commonly known as: NORVASC   Take 5 mg by mouth daily.      benazepril 20 MG tablet   Commonly known as: LOTENSIN   take 1 tablet by mouth once daily      bisoprolol 5 MG tablet   Commonly known as: ZEBETA   1/2  tablet daily      buPROPion 300 MG 24 hr tablet   Commonly known as: WELLBUTRIN XL   Take 300 mg by mouth daily.      CALCIUM + D PO   Take 500 mg by mouth daily.      celecoxib 200 MG capsule   Commonly known as: CELEBREX   Take 200 mg by mouth daily.      cyclobenzaprine 10 MG tablet   Commonly known as: FLEXERIL   Take 10 mg by mouth as needed. Muscle spasms      diltiazem 180 MG 24 hr capsule   Commonly known as: DILACOR XR   Take 1 capsule (180 mg total) by mouth daily.      estradiol 2 MG tablet   Commonly known as: ESTRACE   Take 2 mg by mouth daily.      loratadine-pseudoephedrine 10-240 MG per 24 hr tablet   Commonly known as: CLARITIN-D 24-hour   Take 1 tablet by mouth daily. As needed for allergy      montelukast 10 MG tablet   Commonly known as: SINGULAIR   Take 10 mg by mouth as needed. For breathing nasal drainage congestion relief      MULTIVITAMIN PO   Take by mouth.      mupirocin ointment 2 %   Commonly known as: BACTROBAN   Apply 1 application topically 2 (two) times daily. with cotton tip swab, apply pea sized amount into each nare.  Gently press sides of nose together to spread oint. AVOID contact with eyes.            Disposition and Follow-up:  Discharge Orders    Future Appointments: Provider: Department: Dept Phone: Center:   08/11/2011 11:15 AM Cassell Clement, MD [facility] [phone] None     Future Orders Please Complete By Expires   Diet - low sodium heart healthy      Increase activity slowly      Discharge instructions  Comments:   Soft diet and advance as tolerated to regular diet Stool softeners recommended  Follow up with surgery to have a recheck done of your previous abdominal surgery Return if symptoms recur, worsen or new problems develop   Call MD for:  persistant nausea and vomiting      Call MD for:  severe uncontrolled pain      Call MD for:  difficulty breathing, headache or visual disturbances        Call MD for:  extreme fatigue      Call MD for:  persistant dizziness or light-headedness        Follow-up Information    Follow up with Cassell Clement, MD in 1 week.      Follow up with ROSENBOWER,TODD J, MD in 10 days.   Contact information:   [facility] [address] [place] [address] [phone]           The risks, benefits, and possible side effects of all treatments and tests were explained to the patient.  The patient verbalized understanding.  The importance of close follow up with the primary care medical provider was explained clearly to the patient.  The patient verbalized understanding.  The patient was given instructions to return if symptoms recur, worsen or new changes develop.  The patient verbalized understanding.   Cleora Fleet, MD, CDE, FAAFP [facility] [place], [place]   Total Time spent reviewing critical document, reviewing this patient's comprehensive hospitalization, arranging follow up and coordination of care, reviewing data and todays exam greater than 35 minutes.   Signed: Tacey Dimaggio 07/07/2011 12:05 PM

## 2011-07-07 NOTE — Discharge Instructions (Signed)
Small Bowel Obstruction A small bowel obstruction is a blockage (obstruction) of the small intestine (small bowel). The small bowel is a long, slender tube that connects the stomach to the colon. Its job is to absorb nutrients from the fluids and foods you consume into the bloodstream.  CAUSES  There are many causes of intestinal blockage. The most common ones include:  Hernias. This is a more common cause in children than adults.   Inflammatory bowel disease (enteritis and colitis).   Twisting of the bowel (volvulus).   Tumors.   Scar tissue (adhesions) from previous surgery or radiation treatment.   Recent surgery. This may cause an acute small bowel obstruction called an ileus.  SYMPTOMS   Abdominal pain. This may be dull cramps or sharp pain. It may occur in one area or may be present in the entire abdomen. Pain can range from mild to severe, depending on the degree of obstruction.   Nausea and vomiting. Vomit may be greenish or yellow bile color.   Distended or swollen stomach. Abdominal bloating is a common symptom.   Constipation.   Lack of passing gas.   Frequent belching.   Diarrhea. This may occur if runny stool is able to leak around the obstruction.  DIAGNOSIS  Your caregiver can usually diagnose small bowel obstruction by taking a history, doing a physical exam, and taking X-rays. If the cause is unclear, a CT scan (computerized tomography) of your abdomen and pelvis may be needed. TREATMENT  Treatment of the blockage depends on the cause and how bad the problem is.   Sometimes, the obstruction improves with bed rest and intravenous (IV) fluids.   Resting the bowel is very important. This means following a simple diet. Sometimes, a clear liquid diet may be required for several days.   Sometimes, a small tube (nasogastric tube) is placed into the stomach to decompress the bowel. When the bowel is blocked, it usually swells up like a balloon filled with air and  fluids. Decompression means that the air and fluids are removed by suction through that tube. This can help with pain, discomfort, and nausea. It can also help the obstruction resolve faster.   Surgery may be required if other treatments do not work. Bowel obstruction from a hernia may require early surgery and can be an emergency procedure. Adhesions that cause frequent or severe obstructions may also require surgery.  HOME CARE INSTRUCTIONS If your bowel obstruction is only partial or incomplete, you may be allowed to go home.  Get plenty of rest.   Follow your diet as directed by your caregiver.   Only consume clear liquids until your condition improves.   Avoid solid foods as instructed.  SEEK IMMEDIATE MEDICAL CARE IF:  You have increased pain or cramping.   You vomit blood.   You have uncontrolled vomiting or nausea.   You cannot drink fluids due to vomiting or pain.   You develop confusion.   You begin feeling very dry or thirsty (dehydrated).   You have severe bloating.   You have chills.   You have a fever.   You feel extremely weak or you faint.  MAKE SURE YOU:  Understand these instructions.   Will watch your condition.   Will get help right away if you are not doing well or get worse.  Document Released: 06/21/2005 Document Revised: 03/24/2011 Document Reviewed: 06/18/2010 [facility] Patient Information 2012 [place], [place].  Constipation in Adults Constipation is having fewer than 2 bowel movements per  week. Usually, the stools are hard. As we grow older, constipation is more common. If you try to fix constipation with laxatives, the problem may get worse. This is because laxatives taken over a long period of time make the colon muscles weaker. A low-fiber diet, not taking in enough fluids, and taking some medicines may make these problems worse. MEDICATIONS THAT MAY CAUSE CONSTIPATION  Water pills (diuretics).   Calcium channel blockers (used to control  blood pressure and for the heart).   Certain pain medicines (narcotics).   Anticholinergics.   Anti-inflammatory agents.   Antacids that contain aluminum.  DISEASES THAT CONTRIBUTE TO CONSTIPATION  Diabetes.   Parkinson's disease.   Dementia.   Stroke.   Depression.   Illnesses that cause problems with salt and water metabolism.  HOME CARE INSTRUCTIONS   Constipation is usually best cared for without medicines. Increasing dietary fiber and eating more fruits and vegetables is the best way to manage constipation.   Slowly increase fiber intake to 25 to 38 grams per day. Whole grains, fruits, vegetables, and legumes are good sources of fiber. A dietitian can further help you incorporate high-fiber foods into your diet.   Drink enough water and fluids to keep your urine clear or pale yellow.   A fiber supplement may be added to your diet if you cannot get enough fiber from foods.   Increasing your activities also helps improve regularity.   Suppositories, as suggested by your caregiver, will also help. If you are using antacids, such as aluminum or calcium containing products, it will be helpful to switch to products containing magnesium if your caregiver says it is okay.   If you have been given a liquid injection (enema) today, this is only a temporary measure. It should not be relied on for treatment of longstanding (chronic) constipation.   Stronger measures, such as magnesium sulfate, should be avoided if possible. This may cause uncontrollable diarrhea. Using magnesium sulfate may not allow you time to make it to the bathroom.  SEEK IMMEDIATE MEDICAL CARE IF:   There is bright red blood in the stool.   The constipation stays for more than 4 days.   There is belly (abdominal) or rectal pain.   You do not seem to be getting better.   You have any questions or concerns.  MAKE SURE YOU:   Understand these instructions.   Will watch your condition.   Will get  help right away if you are not doing well or get worse.  Document Released: 01/01/2004 Document Revised: 03/24/2011 Document Reviewed: 03/08/2011 [facility] Patient Information 2012 [place], [place].

## 2011-07-07 NOTE — Progress Notes (Signed)
Patient's MRSA swab came back positive per lab, positive MRSA protocol initiated.

## 2011-07-07 NOTE — Progress Notes (Signed)
Patient d/c to home, stable. 

## 2011-07-07 NOTE — Progress Notes (Signed)
D/C instructions given with prescriptions, patient verbalized understanding, stable

## 2011-08-03 ENCOUNTER — Ambulatory Visit (INDEPENDENT_AMBULATORY_CARE_PROVIDER_SITE_OTHER): Payer: BC Managed Care – PPO | Admitting: General Surgery

## 2011-08-03 ENCOUNTER — Encounter (INDEPENDENT_AMBULATORY_CARE_PROVIDER_SITE_OTHER): Payer: Self-pay | Admitting: General Surgery

## 2011-08-03 VITALS — BP 142/84 | HR 70 | Temp 97.8°F | Resp 18 | Ht 66.0 in | Wt 179.2 lb

## 2011-08-03 DIAGNOSIS — K56609 Unspecified intestinal obstruction, unspecified as to partial versus complete obstruction: Secondary | ICD-10-CM

## 2011-08-03 NOTE — Patient Instructions (Signed)
Moderate level of fiber in diet.

## 2011-08-03 NOTE — Progress Notes (Signed)
Patient ID: Kari Collum Rizzi, female   DOB: [birth year], 61 y.o.   MRN: [id number]  Chief Complaint  Patient presents with  . New Evaluation    SBO    HPI [name] is a 61 y.o. female.   HPI  She is referred by Dr. Standley Dakins for followup of a hospitalization for a partial small bowel obstruction. She was admitted in mid March and was in the hospital for about 3 days. CT scan demonstrated some mild dilatation of the small intestine around the area of her previous small bowel anastomosis. There was also dilatation of the colon with a lot of stool. She said some previous episodes of this type of discomfort before there were self limited. She is feeling fine now and has no intestinal complaints.  Past Medical History  Diagnosis Date  . Hypertension     She has a past hx of essential  . 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 Inflammation of joint of knee     Since we last last saw her she developed problems with an acute which required surgical drainage by her orthopedist Dr. Cleophas Dunker.  Marland Kitchen MRSA (methicillin resistant Staphylococcus aureus)     Knee surgery drainage was positive for MRSA and she was treated with 3 weeks of doxycycline successfully.  . Anemia     Past Hx  . Diarrhea     Mild  . Exogenous obesity   . Pneumonia   . Atrial flutter   . Arthritis   . GERD (gastroesophageal reflux disease)     Past Surgical History  Procedure Date  . Knee surgery   . Small intestine surgery   . Colonoscopy 05/16/2011    Procedure: COLONOSCOPY;  Surgeon: Vertell Novak., MD;  Location: [facility];  Service: Endoscopy;  Laterality: N/A;  . Appendectomy 1962  . Abdominal hysterectomy 1988  . Small intestine surgery 1992, 1999  . Laparotomy and lysis of adhesions     Family History  Problem Relation Age of Onset  . Stroke Father   . Atrial fibrillation Father   . Hypertension Mother     Social  History History  Substance Use Topics  . Smoking status: Current Some Day Smoker -- 0.2 packs/day for 20 years  . Smokeless tobacco: Never Used  . Alcohol Use: No     1 - 2 glasses of wine per week    Allergies  Allergen Reactions  . Codeine Nausea And Vomiting  . Tetanus Toxoids Other (See Comments)    serum  . Penicillins Hives and Rash    Current Outpatient Prescriptions  Medication Sig Dispense Refill  . amLODipine (NORVASC) 5 MG tablet Take 5 mg by mouth as needed.       . benazepril (LOTENSIN) 20 MG tablet take 1 tablet by mouth once daily  30 tablet  PRN  . bisoprolol (ZEBETA) 5 MG tablet 1/2 tablet daily  15 tablet  3  . buPROPion (WELLBUTRIN XL) 300 MG 24 hr tablet Take 300 mg by mouth daily.       . Calcium Carbonate-Vitamin D (CALCIUM + D PO) Take 500 mg by mouth daily.        . celecoxib (CELEBREX) 200 MG capsule Take 200 mg by mouth daily.      . cyclobenzaprine (FLEXERIL) 10 MG tablet Take 10 mg by mouth as needed. Muscle spasms      .  diltiazem (DILACOR XR) 180 MG 24 hr capsule Take 1 capsule (180 mg total) by mouth daily.  30 capsule  1  . estradiol (ESTRACE) 2 MG tablet Take 2 mg by mouth daily.        Marland Kitchen loratadine-pseudoephedrine (CLARITIN-D 24-HOUR) 10-240 MG per 24 hr tablet Take 1 tablet by mouth daily. As needed for allergy      . montelukast (SINGULAIR) 10 MG tablet Take 10 mg by mouth as needed. For breathing nasal drainage congestion relief      . Multiple Vitamin (MULTIVITAMIN PO) Take by mouth.          Review of Systems Review of Systems  Constitutional: Negative for fever and chills.  Respiratory: Negative for cough and wheezing.   Cardiovascular: Negative for chest pain and leg swelling.  Gastrointestinal: Negative.   Musculoskeletal: Positive for arthralgias.    Blood pressure 142/84, pulse 70, temperature 97.8 F (36.6 C), temperature source Temporal, resp. rate 18, height 5\' 6"  (1.676 m), weight 179 lb 4 oz (81.307 kg).  Physical  Exam Physical Exam  Constitutional: She appears distressed.       Overweight female in NAD.  HENT:  Head: Normocephalic and atraumatic.  Abdominal: Soft. She exhibits no distension and no mass. There is no tenderness.       Midline incision is clean, intact, and solid.  Musculoskeletal:       Dependent rubor is present.    Data Reviewed Notes from recent hospitalization.  Assessment    Partial small bowel obstruction that was quick to resolve. Most likely related to adhesions. Also had some constipation.  She is completely asymptomatic at this time    Plan    Avoid excessively large amounts of fiber foods. Take a laxative of choice or constipation. Return visit p.r.n.       Benedicto Capozzi J 08/03/2011, 10:45 AM

## 2011-08-11 ENCOUNTER — Ambulatory Visit (INDEPENDENT_AMBULATORY_CARE_PROVIDER_SITE_OTHER): Payer: BC Managed Care – PPO | Admitting: Cardiology

## 2011-08-11 ENCOUNTER — Encounter: Payer: Self-pay | Admitting: Cardiology

## 2011-08-11 VITALS — BP 148/90 | HR 74 | Ht 66.0 in | Wt 179.0 lb

## 2011-08-11 DIAGNOSIS — R197 Diarrhea, unspecified: Secondary | ICD-10-CM

## 2011-08-11 DIAGNOSIS — I480 Paroxysmal atrial fibrillation: Secondary | ICD-10-CM

## 2011-08-11 DIAGNOSIS — I119 Hypertensive heart disease without heart failure: Secondary | ICD-10-CM

## 2011-08-11 DIAGNOSIS — I4891 Unspecified atrial fibrillation: Secondary | ICD-10-CM

## 2011-08-11 MED ORDER — BISOPROLOL FUMARATE 5 MG PO TABS: 5.0000 mg | ORAL_TABLET | Freq: Every day | ORAL | Status: DC

## 2011-08-11 NOTE — Assessment & Plan Note (Signed)
She is followed by Dr. Randa Evens for her previous diagnosis of ischemic colitis

## 2011-08-11 NOTE — Assessment & Plan Note (Signed)
Her blood pressure is still running high on current therapy.  We will increase her bisoprolol from 2.5 up to 5 mg daily.

## 2011-08-11 NOTE — Assessment & Plan Note (Signed)
She has not had any recurrent atrial fibrillation.  She is on bisoprolol 2.5 mg daily.  She is not on Coumadin.

## 2011-08-11 NOTE — Patient Instructions (Signed)
Increase your Bisoprolol to 5 mg daily (whole tablet)  Your physician recommends that you schedule a follow-up appointment in: 4 months

## 2011-08-11 NOTE — Progress Notes (Signed)
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  He has a complex past medical history.  Earlier this year she was hospitalized on the medical service after presenting with sudden onset of severe diarrhea associated with syncope and hypotension.  In the course of that hospitalization she underwent a colonoscopy which showed white: Ischemia and she was diagnosed with ischemic colitis.  During that hospital stay she also had several episodes of paroxysmal atrial fibrillation with rapid ventricular response and she converted with beta blocker.  Echocardiogram showed normal left ventricular systolic function and normal left atrial size.  She's had a past history of anemia.  She is not on warfarin.  Since that episode she was also rehospitalized with small bowel obstruction and did not require surgery.  Saw her surgeon yesterday, Dr. Kae Heller her, who advised that she avoid too much fiber and also avoid constipation.  The patient also has had problems with her hip and has required injections the first of which helped a great deal but the second injection did not help and she will be consulting Dr. Norlene Campbell again  Current Outpatient Prescriptions  Medication Sig Dispense Refill  . benazepril (LOTENSIN) 20 MG tablet take 1 tablet by mouth once daily  30 tablet  PRN  . bisoprolol (ZEBETA) 5 MG tablet Take 1 tablet (5 mg total) by mouth daily.  30 tablet  11  . buPROPion (WELLBUTRIN XL) 300 MG 24 hr tablet Take 300 mg by mouth daily.       . Calcium Carbonate-Vitamin D (CALCIUM + D PO) Take 500 mg by mouth daily.        . celecoxib (CELEBREX) 200 MG capsule Take 200 mg by mouth daily.      . cyclobenzaprine (FLEXERIL) 10 MG tablet Take 10 mg by mouth as needed. Muscle  spasms      . estradiol (ESTRACE) 2 MG tablet Take 2 mg by mouth daily.        . hydrochlorothiazide (HYDRODIURIL) 25 MG tablet Take 25 mg by mouth daily.      Marland Kitchen loratadine-pseudoephedrine (CLARITIN-D 24-HOUR) 10-240 MG per 24 hr tablet Take 1 tablet by mouth daily. As needed for allergy      . montelukast (SINGULAIR) 10 MG tablet Take 10 mg by mouth as needed. For breathing nasal drainage congestion relief      . Multiple Vitamin (MULTIVITAMIN PO) Take by mouth.        . DISCONTD: bisoprolol (ZEBETA) 5 MG tablet 1/2 tablet daily  15 tablet  3    Allergies  Allergen Reactions  . Codeine Nausea And Vomiting  . Tetanus Toxoids Other (See Comments)    serum  . Penicillins Hives and Rash    Patient Active Problem List  Diagnoses  . 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 Pure hypercholesterolemia  . Anemia  . Diarrhea  . Dehydration  . Chronic back pain  . Atrial fibrillation with rapid ventricular response  . Partial small bowel obstruction  . Atrial fibrillation  . Chronic ischemic colitis  . Hypotension    History  Smoking status  . Current Some Day Smoker -- 0.2 packs/day for 20 years  Smokeless tobacco  . Never Used    History  Alcohol  Use No    1 - 2 glasses of wine per week    Family History  Problem Relation Age of Onset  . Stroke Father   . Atrial fibrillation Father   . Hypertension Mother     Review of Systems: Constitutional: no fever chills diaphoresis or fatigue or change in weight.  Head and neck: no hearing loss, no epistaxis, no photophobia or visual disturbance. Respiratory: No cough, shortness of breath or wheezing. Cardiovascular: No chest pain peripheral edema, palpitations. Gastrointestinal: No abdominal distention, no abdominal pain, no change in bowel habits hematochezia or melena. Genitourinary: No dysuria, no frequency, no urgency, no nocturia. Musculoskeletal:No arthralgias, no back pain, no gait disturbance or  myalgias. Neurological: No dizziness, no headaches, no numbness, no seizures, no syncope, no weakness, no tremors. Hematologic: No lymphadenopathy, no easy bruising. Psychiatric: No confusion, no hallucinations, no sleep disturbance.    Physical Exam: Filed Vitals:   08/11/11 1130  BP: 148/90  Pulse: 74   the general appearance reveals a well-developed well-nourished woman in no distress.Pupils equal and reactive.   Extraocular Movements are full.  There is no scleral icterus.  The mouth and pharynx are normal.  The neck is supple.  The carotids reveal no bruits.  The jugular venous pressure is normal.  The thyroid is not enlarged.  There is no lymphadenopathy.  The chest is clear to percussion and auscultation. There are no rales or rhonchi. Expansion of the chest is symmetrical.  The precordium is quiet.  The first heart sound is normal.  The second heart sound is physiologically split.  There is no murmur gallop rub or click.  There is no abnormal lift or heave.  Abdomen reveals no mass or tenderness.  Bowel sounds are present.Marland Kitchen extremities show acrocyanosis and 1+ pedal pulses Strength is normal and symmetrical in all extremities.  There is no lateralizing weakness.  There are no sensory deficits.     Assessment / Plan: Because of her health condition she has not returned to work and I think that this is wise.  He will continue same medication except increase bisoprolol of 25 mg daily.  She be rechecked in 4 months for followup office visit.

## 2011-08-19 ENCOUNTER — Other Ambulatory Visit: Payer: Self-pay | Admitting: Cardiology

## 2011-08-19 NOTE — Telephone Encounter (Signed)
Refilled hctz 

## 2011-08-23 ENCOUNTER — Telehealth: Payer: Self-pay | Admitting: Cardiology

## 2011-08-23 NOTE — Telephone Encounter (Signed)
Walk-In Pt Form. Pt drop off form from [facility]. To be filled out. Sent to Sent to [facility]  08/23/11  TK

## 2011-09-28 ENCOUNTER — Telehealth: Payer: Self-pay | Admitting: Cardiology

## 2011-09-28 NOTE — Telephone Encounter (Signed)
New msg Pt wants to know status of surgical clearance paperwork. She said Dr Cleophas Dunker [facility] hasnt received anything. Please call her back

## 2011-09-28 NOTE — Telephone Encounter (Signed)
Faxed information again.

## 2011-10-03 ENCOUNTER — Telehealth: Payer: Self-pay | Admitting: *Deleted

## 2011-10-03 NOTE — Telephone Encounter (Signed)
PATIENT CONFIRMED OVER THE PHONE ON 10-03-2011 APPOINTMENT FOR 11-07-2011 STARTING AT 10:00AM WITH THE FINANCIAL COUNSELING

## 2011-10-11 ENCOUNTER — Telehealth: Payer: Self-pay | Admitting: Oncology

## 2011-10-11 NOTE — Telephone Encounter (Signed)
Referred by Dr. Carman Ching Dx- Anemia

## 2011-10-14 ENCOUNTER — Encounter (HOSPITAL_COMMUNITY): Payer: Self-pay

## 2011-10-14 ENCOUNTER — Encounter (HOSPITAL_COMMUNITY)
Admission: RE | Admit: 2011-10-14 | Discharge: 2011-10-14 | Disposition: A | Discharge status: Home / Self Care | Payer: BC Managed Care – PPO | Source: Ambulatory Visit | Attending: Orthopaedic Surgery | Admitting: Orthopaedic Surgery

## 2011-10-14 ENCOUNTER — Other Ambulatory Visit (HOSPITAL_COMMUNITY): Payer: BC Managed Care – PPO

## 2011-10-14 HISTORY — DX: Personal history of other medical treatment: Z92.89

## 2011-10-14 HISTORY — DX: Headache: R51

## 2011-10-14 LAB — COMPREHENSIVE METABOLIC PANEL
ALT: 39 U/L — ABNORMAL HIGH (ref 0–35)
AST: 35 U/L (ref 0–37)
Alkaline Phosphatase: 337 U/L — ABNORMAL HIGH (ref 39–117)
CO2: 20 mEq/L (ref 19–32)
Chloride: 91 mEq/L — ABNORMAL LOW (ref 96–112)
GFR calc non Af Amer: 64 mL/min — ABNORMAL LOW (ref >90)
Potassium: 4.7 mEq/L (ref 3.5–5.1)
Sodium: 124 mEq/L — ABNORMAL LOW (ref 135–145)
Total Bilirubin: 0.5 mg/dL (ref 0.3–1.2)

## 2011-10-14 LAB — SURGICAL PCR SCREEN
MRSA, PCR: POSITIVE — AB
Staphylococcus aureus: POSITIVE — AB

## 2011-10-14 LAB — APTT: aPTT: 32 seconds (ref 24–37)

## 2011-10-14 LAB — CBC
MCV: 95.4 fL (ref 78.0–100.0)
Platelets: 346 10*3/uL (ref 150–400)
RBC: 3.24 MIL/uL — ABNORMAL LOW (ref 3.87–5.11)
WBC: 4.2 10*3/uL (ref 4.0–10.5)

## 2011-10-14 LAB — DIFFERENTIAL
Basophils Absolute: 0 10*3/uL (ref 0.0–0.1)
Basophils Relative: 1 % (ref 0–1)
Eosinophils Relative: 1 % (ref 0–5)
Monocytes Absolute: 1.2 10*3/uL — ABNORMAL HIGH (ref 0.1–1.0)
Monocytes Relative: 29 % — ABNORMAL HIGH (ref 3–12)

## 2011-10-14 NOTE — Pre-Procedure Instructions (Signed)
20 [name]  10/14/2011   Your procedure is scheduled on:  10/25/2011  Report to [facility] at 5:30 AM.  Call this number if you have problems the morning of surgery: [phone]   Remember:   Do not eat food:After Midnight.      Take these medicines the morning of surgery with A SIP OF WATER: bisoprolol, wellbutrin, estrace, prevacid, claritin, pain med. Is ok if needed    Do not wear jewelry, make-up or nail polish.  Do not wear lotions, powders, or perfumes. You may wear deodorant.  Do not shave 48 hours prior to surgery. Men may shave face and neck.  Do not bring valuables to the hospital.  Contacts, dentures or bridgework may not be worn into surgery.  Leave suitcase in the car. After surgery it may be brought to your room.  For patients admitted to the hospital, checkout time is 11:00 AM the day of discharge.   Patients discharged the day of surgery will not be allowed to drive home.  Name and phone number of your driver: /w daughter  Special Instructions: CHG Shower Use Special Wash: 1/2 bottle night before surgery and 1/2 bottle morning of surgery.   Please read over the following fact sheets that you were given: Pain Booklet, Coughing and Deep Breathing, Blood Transfusion Information, MRSA Information and Surgical Site Infection Prevention

## 2011-10-14 NOTE — Progress Notes (Addendum)
Pt. Going to beach next week, doesn't want to wear Blood Bank  armband all week in the ocean.  Wishes to have lab appt. Day before surgery for blood bank specimen. Arranged for lab draw 1200noon on 10/24/2011, pt. Will bring urine specimen that day as well.

## 2011-10-16 NOTE — Patient Instructions (Addendum)
1.   Chronic anemia:  As far back as 2008; possibly longer. 2.   Potential etiologies:  Chronic inflammation; Vitamin B12 deficiency.  Pure iron deficiency anemia was ruled out in the recent past.  Your folate and thyroid levels were normal.  Labs not consistent with hemolysis.  Need to rule out other rare causes of chronic anemia:  Myeloma.  Cannot rule out pure red cell aplasia or myelodysplatic synrome (MDS) at this time.  Since there is pending hip replacement, I recommend proceeding with diagnostic bone marrow biopsy to rule out MDS prior to that surgery.  3.  Follow up with me in clinic in about 1 month (it takes one month to get result of cytogenetics from bone marrow biopsy).  Of course, if there is obvious bone marrow abnormality, we will see you sooner.

## 2011-10-17 ENCOUNTER — Other Ambulatory Visit: Payer: BC Managed Care – PPO

## 2011-10-17 ENCOUNTER — Ambulatory Visit: Payer: BC Managed Care – PPO

## 2011-10-17 ENCOUNTER — Encounter: Payer: Self-pay | Admitting: Oncology

## 2011-10-17 ENCOUNTER — Ambulatory Visit (HOSPITAL_BASED_OUTPATIENT_CLINIC_OR_DEPARTMENT_OTHER): Payer: BC Managed Care – PPO | Admitting: Oncology

## 2011-10-17 ENCOUNTER — Telehealth: Payer: Self-pay | Admitting: Oncology

## 2011-10-17 VITALS — BP 150/88 | HR 89 | Temp 96.9°F | Ht 64.0 in | Wt 181.9 lb

## 2011-10-17 DIAGNOSIS — R718 Other abnormality of red blood cells: Secondary | ICD-10-CM

## 2011-10-17 DIAGNOSIS — D649 Anemia, unspecified: Secondary | ICD-10-CM

## 2011-10-17 DIAGNOSIS — D4620 Refractory anemia with excess of blasts, unspecified: Secondary | ICD-10-CM

## 2011-10-17 DIAGNOSIS — I10 Essential (primary) hypertension: Secondary | ICD-10-CM

## 2011-10-17 LAB — CBC & DIFF AND RETIC
Basophils Absolute: 0 10*3/uL (ref 0.0–0.1)
Eosinophils Absolute: 0.1 10*3/uL (ref 0.0–0.5)
HGB: 10 g/dL — ABNORMAL LOW (ref 11.6–15.9)
MONO#: 1.1 10*3/uL — ABNORMAL HIGH (ref 0.1–0.9)
NEUT#: 1.4 10*3/uL — ABNORMAL LOW (ref 1.5–6.5)
Platelets: 286 10*3/uL (ref 145–400)
RBC: 3.02 10*6/uL — ABNORMAL LOW (ref 3.70–5.45)
RDW: 14.2 % (ref 11.2–14.5)
Retic %: 1.75 % (ref 0.70–2.10)
Retic Ct Abs: 52.85 10*3/uL (ref 33.70–90.70)
WBC: 4.1 10*3/uL (ref 3.9–10.3)

## 2011-10-17 LAB — COMPREHENSIVE METABOLIC PANEL
Albumin: 4 g/dL (ref 3.5–5.2)
BUN: 23 mg/dL (ref 6–23)
CO2: 20 mEq/L (ref 19–32)
Glucose, Bld: 91 mg/dL (ref 70–99)
Sodium: 127 mEq/L — ABNORMAL LOW (ref 135–145)
Total Bilirubin: 0.4 mg/dL (ref 0.3–1.2)
Total Protein: 7.5 g/dL (ref 6.0–8.3)

## 2011-10-17 LAB — CHCC SMEAR

## 2011-10-17 LAB — LACTATE DEHYDROGENASE: LDH: 199 U/L (ref 94–250)

## 2011-10-17 LAB — MORPHOLOGY

## 2011-10-17 NOTE — Telephone Encounter (Signed)
Gave pt appt calendar for July and August 2013 lab and MD

## 2011-10-17 NOTE — Consult Note (Addendum)
Anesthesia Chart Review:  Patient is a 61 year old female scheduled for left THR on 10/25/11 by Dr. Cleophas Dunker.  History includes smoking, HTN, obesity with BMI 30, headaches, depression, GERD, afib/PAF, anemia, PNA, elevated LFTs, ischemia colitis (with underlying PAF with RVR without anticoagulation) and partial SBO earlier this year (2013), two prior intestinal surgeries.  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    Her Cardiologist is Dr. Patty Sermons, with last visit on 08/11/11.  He cleared her from a Cardiology standpoint, but recommended getting Dr. Carman Ching input as well for anemia.  Dr. Randa Evens cleared her from a medical standpoint.  It also appears she is being seen today at the [facility]).  Will follow-up on notes when available.    Her last EKG was done on 07/05/11 and showed SR, borderline LAD.  It appears stable since her last EKG at [facility] on 06/15/11.  Echo on 05/09/11 showed:   - Left ventricle: The cavity size was normal. Wall thickness was increased in a pattern of mild LVH. There was mild focal basal hypertrophy of the septum. Systolic function was vigorous. The estimated ejection fraction was in the range of 65% to 70%. - Mitral valve: Calcified annulus. - Tricuspid valve: Trivial regurgitation. Impressions: - Technically difficult; proximal septal thickening with narrow LVOT; mildly elevated LVOT gradient of 1.8 m/s.  Labs noted.  Her Na is 124, Cl 91 (down from 127/99 on 07/06/11).  Cr 0.94, Alk Phos 337 but relatively normal AST/ALT (ALT minimally elevated at 39).  H/H 10.6/30.9.  Coags WNL.  She is scheduled for a lab only visit on 10/24/11 for her UA, T&S.  She is also getting labs drawn today at [facility].  I wrote for a repeat BMET, and Dr. Gaylyn Rong said he would add that to her labs today.  Her meds do include HCTZ, benazepril, bisoprolol, Celebrex.  Her last CXR on 05/10/11 showed: 1. Small bilateral pleural  effusions  2. Bibasilar atelectasis.  I'll follow-up her Hematology notes and follow-up lab results as available.  I updated Anesthesiologist Dr. Ivin Booty and updated Keri at Dr. Hoy Register office.  Shonna Chock, PA-C 10/17/11 1354  Addendum: 10/18/11 0935 Reviewed Dr. Lodema Pilot note and her labs from yesterday.  Na is 127, Cl 93.  Alk Ph 404, AST 49, ALT 46.  H/H 10/28.7, PLT 286.  Her peripheral blood smear showed anisocytosis, rare myelocytes and left shift and possible blasts.  Sed rate elevated at 75.  He has recommended a bone marrow biopsy pre-operatively to rule out myelodysplastic syndrome (MDS).  This is being done today.  He feels he will have at least a preliminary result by 10/24/11 and can make a recommendation regarding proceeding with surgery at that time.  At this point, she is still scheduled for additional labs on 10/24/11.  I'll plan to recheck her BMET again at that time.  Addendum: 10/24/11  1400 I updated Annabelle Harman at Dr. Hoy Register office and spoke with Ms. Alfonso Patten on 10/19/11 to review her lab results.  She reports Dr. Patty Sermons acts as both her PCP and Cardiologist.  I encouraged her to also review results with him (they have already been routed to his in-basket).  She is planning to hold her HCTZ for a few days.  Her BP today was 122/84.  Labs done today showed her NA is up to 130.  UA WNL.  T&S and urine C&S were also done today but results are  still pending.    Dr. Gaylyn Rong has reviewed her preliminary bone marrow biopsy results.  Below are the current findings and his recommendations: "Her methylmalonic acid was slightly elevated signifying mild Vitamin 12 deficiency. Her bone marrow biopsy on 10/18/2011 showed dysplastic change along with peripheral monocytosis. There was concerned for myelodysplastic/myeloproliferative state such as chronic myelomonocytic leukemia (CMML). There were only 2% blast on bone marrow aspirate. We're waiting for cytogenetics to rule out significant mutations. 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  Plan:  - Proceed with hip replacement as planned for 10/25/11  - When she comes back, I will start Vitamin B12 injection as Vitamin B12 deficiency can result in dysplastic changes in bone marrow biopsy.  - Definitive treatment for CMML is contingent on her cytogenetics which should be out in about 3 weeks. But this should not preclude her from proceeding with her hip replacement from hematology standpoint.

## 2011-10-17 NOTE — Progress Notes (Signed)
[facility]  Telephone:(336) [phone] Fax:(336) [phone]     INITIAL HEMATOLOGY CONSULTATION    Referral MD:  Dr. Carman Ching, M.D.  Reason for Referral: refractory normocytic anemia.     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  She was in usual state of health until about 5 years ago when she sustained a fall and had right knee surgery.  She developed MRSA infection requiring Vancomycin.  She noticed that ever since that episode, she has been having anemia.  She had history of small bowel surgery in the distant past without anemia.  This small bowel surgery in 1990 was due to abdominal adhesion.  In 04/2011, she developed ischemic bowel which spontaneously resolved with conservative management.  She was found to have profound anemia, and required pRBC transfusion.  She had colonoscopy by Dr. Randa Evens which found the right sided ischemic colitis without apparent etiology.  Path from that colonoscopy was negative.  She followed up with Dr. Randa Evens and again was found to have normocytic anemia.  Anemia work up was negative including TSH, folate, Vit B12.  She is due to have left hip replacement soon.  With the refractory anemia, she was kindly referred to the [facility] for evaluation.   Ms. [name] presented to the clinic for the first time today by herself.  She reported intractable left hip pain despite Lortab, steroid injection.  Pain is severe, localized to left groin/hip without radiation.  She cannot ambulate very well.  She has chronic bluish toes in cold weather; however, she denies any lacy skin rash in the legs.  She still smokes cigarettes and hopes to completely quit smoking before her hip surgery.  She does have clubbing of her fingers.  She denies visible source of bleeding or symptoms of anemia such as chest pain, dizziness, palpitation.  She has good appetite and denied weight loss.   Patient denies fever,  anorexia, weight loss, headache, visual changes, confusion, drenching night sweats, palpable lymph node swelling, mucositis, odynophagia, dysphagia, nausea vomiting, jaundice, chest pain, palpitation, shortness of breath, dyspnea on exertion, productive cough, gum bleeding, epistaxis, hematemesis, hemoptysis, abdominal pain, abdominal swelling, early satiety, melena, hematochezia, hematuria, skin rash, spontaneous bleeding, heat or cold intolerance, bowel bladder incontinence, back pain, focal motor weakness, paresthesia, depression, suicidal or homocidal ideation, feeling hopelessness.   Past Medical History  Diagnosis Date  . Hypertension     She has a past hx of essential  . 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 Inflammation of joint of knee     Since we last last saw her she developed problems with an acute which required surgical drainage by her orthopedist Dr. Cleophas Dunker.  Marland Kitchen MRSA (methicillin resistant Staphylococcus aureus)     Knee surgery drainage was positive for MRSA and she was treated with 3 weeks of doxycycline successfully.  . Diarrhea     Mild  . Exogenous obesity   . Atrial flutter     during hospitalization, 04/2011- related to anemia & illness/stress   . Depression   . GERD (gastroesophageal reflux disease)     2 hosp.- ischemic colitis - residual of Norovirus, 05/2011- sm. bowel obstruction  . Headache     migraine headache on occas, less now than when she was younger   . Arthritis     L hip,  back, neck   . History of blood transfusion     04/2011- /w ischemic colitis   . Anemia     will see hematology consult prior to surgery, recommended by Dr. Patty Sermons  . Pneumonia     04/2011- not hospitalized , pt. denies SOB, changes in chest, breathing  :    Past Surgical History  Procedure Date  . Knee surgery     I&D- 2008, post laceration   . Colonoscopy 05/16/2011    Procedure: COLONOSCOPY;   Surgeon: Vertell Novak., MD;  Location: [facility];  Service: Endoscopy;  Laterality: N/A;  . Appendectomy 1962  . Abdominal hysterectomy 1988  . Small intestine surgery 1992, 1999  . Laparotomy and lysis of adhesions   :   CURRENT MEDS: Current Outpatient Prescriptions  Medication Sig Dispense Refill  . benazepril (LOTENSIN) 20 MG tablet Take 20 mg by mouth daily.      . bisoprolol (ZEBETA) 5 MG tablet Take 5 mg by mouth daily before breakfast.      . buPROPion (WELLBUTRIN XL) 300 MG 24 hr tablet Take 300 mg by mouth daily before breakfast.       . Calcium Carbonate-Vitamin D (CALCIUM + D PO) Take 500 mg by mouth daily.        . celecoxib (CELEBREX) 200 MG capsule Take 200 mg by mouth daily before breakfast.       . cyclobenzaprine (FLEXERIL) 10 MG tablet Take 10 mg by mouth as needed. Muscle spasms      . estradiol (ESTRACE) 2 MG tablet Take 2 mg by mouth daily before breakfast.       . hydrochlorothiazide (HYDRODIURIL) 25 MG tablet Take 25 mg by mouth daily.      Marland Kitchen HYDROcodone-acetaminophen (LORTAB) 10-500 MG per tablet Take 0.5-2 tablets by mouth every 6 (six) hours as needed.      . lansoprazole (PREVACID) 15 MG capsule Take 15 mg by mouth daily.      Marland Kitchen loratadine-pseudoephedrine (CLARITIN-D 24-HOUR) 10-240 MG per 24 hr tablet Take 1 tablet by mouth daily. For allergies.      . montelukast (SINGULAIR) 10 MG tablet Take 10 mg by mouth as needed. For breathing nasal drainage congestion relief      . Multiple Vitamin (MULTIVITAMIN WITH MINERALS) TABS Take 1 tablet by mouth daily.          Allergies  Allergen Reactions  . Codeine Nausea And Vomiting  . Tetanus Toxoids Other (See Comments)    serum  . Penicillins Hives and Rash  :  Family History  Problem Relation Age of Onset  . Stroke Father   . Atrial fibrillation Father   . Hypertension Mother   :  History   Social History  . Marital Status: Single    Spouse Name: N/A    Number of Children: 3  . Years of  Education: N/A   Occupational History  .      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 Smoking status: Current Some Day Smoker -- 0.2 packs/day for 20 years  . Smokeless tobacco: Never Used  . Alcohol Use: Yes     1 - 2 glasses of wine per week  . Drug Use: No  . Sexually Active: Not on file   Other Topics Concern  . Not on file   Social History Narrative  . No narrative on file  :  REVIEW OF SYSTEM:  The rest of the 14-point review of sytem was negative.   Exam: ECOG 1  General:  well-nourished woman in no acute distress.  Eyes:  no scleral icterus.  ENT:  There were no oropharyngeal lesions.  Neck was without thyromegaly.  Lymphatics:  Negative cervical, supraclavicular or axillary adenopathy.  Respiratory: lungs were clear bilaterally without wheezing or crackles.  Cardiovascular:  Regular rate and rhythm, S1/S2, without murmur, rub or gallop.  There was no pedal edema.  GI:  abdomen was soft, flat, nontender, nondistended, without organomegaly.  Muscoloskeletal:  no spinal tenderness of palpation of vertebral spine. There was clubbing of her fingers.  Skin exam was without echymosis, petichae.  There was bluish color in her toes without paresthesia, lost of sensation.  Her pedal pulses were normal.   Neuro exam was nonfocal.  Patient needed only minimal assistance to get on and off exam table.  Gait was abnormal due to left hip pain.  Patient was alerted and oriented.  Attention was good.   Language was appropriate.  Mood was normal without depression.  Speech was not pressured.  Thought content was not tangential.    LABS:  Lab Results  Component Value Date   WBC 4.1 10/17/2011   HGB 10.0* 10/17/2011   HCT 28.7* 10/17/2011   PLT 286 10/17/2011   GLUCOSE 91 10/17/2011   CHOL 216* 12/15/2010   TRIG 68.0 12/15/2010   HDL 92.50 12/15/2010   LDLDIRECT 105.6 12/15/2010   LDLCALC  Value: 115        Total Cholesterol/HDL:CHD Risk Coronary Heart Disease Risk  Table                     Men   Women  1/2 Average Risk   3.4   3.3* 07/20/2007   ALT 46* 10/17/2011   AST 49* 10/17/2011   NA 127* 10/17/2011   K 4.5 10/17/2011   CL 93* 10/17/2011   CREATININE 0.94 10/17/2011   BUN 23 10/17/2011   CO2 20 10/17/2011   INR 1.12 10/14/2011    Blood smear review:   I personally reviewed the patient's peripheral blood smear today.  There was anisocytosis.  There was rare myelocytes and left shift and possible blasts.   There was no schistocytosis, spherocytosis, target cell, rouleaux formation, tear drop cell.  There was no giant platelets or platelet clumps.     ASSESSMENT AND PLAN:   1.  Hypertension:  Slightly HTN today due to 1st time at the [facility].  She is on benazapril,  Bisoprolol, HCTZ per PCP.   2.  Smoking:  I strongly encouraged her to stop smoking given her family history of CAD/CVA and she has HTN.  She would like to do this cold [place] herself.   3.  Depression:  Mood was well controlled on Wellbutrin.  4.  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   5.  Chronic normocytic anemia since at least 2008:  - Differentialss:  Chronic inflammation; Vitamin B12 deficiency.  Pure iron deficiency anemia was ruled out in the recent past.  Her folate and thyroid levels were normal.  Labs not consistent with hemolysis.  Need to rule out other rare causes of chronic anemia:  Myeloma.  Cannot rule out pure red cell aplasia or myelodysplatic synrome (MDS) at this time.  I am also concerned for MDS due to presence of immature WBC on smear.      -  Work up:  I sent for SPEP, methylmalonic acid.  Since there is pending hip replacement, I recommend proceeding with diagnostic bone marrow biopsy to rule out MDS prior to that surgery.   Follow up with me in clinic in about 1 month (it takes one month to get result of cytogenetics from bone marrow biopsy).  Of course, if there is obvious bone marrow  abnormality, we will see her sooner.  For now, she is due for left hip surgery.  I would like to see prelim result of her bone marrow before recommending her either to proceed or delay that operation.  I should have prelim bone marrow biopsy result by Monday 10/24/11.    Thank you for this referral.    The length of time of the face-to-face encounter was 60 minutes. More than 50% of time was spent counseling and coordination of care.

## 2011-10-18 ENCOUNTER — Ambulatory Visit (HOSPITAL_BASED_OUTPATIENT_CLINIC_OR_DEPARTMENT_OTHER): Payer: BC Managed Care – PPO | Admitting: Oncology

## 2011-10-18 ENCOUNTER — Other Ambulatory Visit (HOSPITAL_COMMUNITY)
Admission: RE | Admit: 2011-10-18 | Discharge: 2011-10-18 | Disposition: A | Discharge status: Home / Self Care | Payer: BC Managed Care – PPO | Source: Ambulatory Visit | Attending: Oncology | Admitting: Oncology

## 2011-10-18 VITALS — BP 179/99 | HR 80 | Temp 98.0°F

## 2011-10-18 DIAGNOSIS — D649 Anemia, unspecified: Secondary | ICD-10-CM | POA: Insufficient documentation

## 2011-10-18 DIAGNOSIS — D72819 Decreased white blood cell count, unspecified: Secondary | ICD-10-CM | POA: Insufficient documentation

## 2011-10-18 NOTE — Patient Instructions (Addendum)
Bone Marrow Aspiration This is an examination in which a needle is inserted into a bone and a portion of the marrow in the bone is removed. This test confirms the diagnosis of anemias, leukemia, or myelomas. It helps determine the cause of decreased blood cells and deficient iron stores. It documents the process of metastasis if there has been a tumor which has spread to bone. It also helps study tumor staging, especially in lymphomas. There are two methods for sampling bone marrow, an aspirate and a biopsy. The bone marrow, a soft fatty tissue found inside the body's larger bones, is often described as being like a honeycomb - it is a fibrous network filled with a liquid containing cells in various stages of maturation, and "raw materials" such as iron, vitamin B12, and folate that are required for cell production. The bone marrow aspirate provides a liquid sample of cells that can be studied individually, and the biopsy collects a sample that preserves the marrow's structure and shows the relationships of bone marrow cells to one another and the relative amount of marrow cells compared to fat (cellularity).  Red blood cells (RBCs), platelets, and five different types of white blood cells (WBCs) are produced in the marrow as needed, with the number and type of cell being created at any one time based on the use of cells, loss, and a continual replacement of old cells. The bone marrow biopsy and aspiration provide information about the status of and capability for blood cell production. They are not routinely ordered and in fact the majority of people will never have one done. A marrow aspiration or biopsy may be ordered to help evaluate blood cell production, to help diagnose leukemia, to help diagnose a bone marrow disorder, to help diagnose and stage a variety of other types of cancer (to determine spread into the marrow), and to help determine whether a severe anemia is due to decreased RBC production,  increased loss, abnormal RBC production, or to a vitamin or mineral deficiency or excess. Conditions that affect the marrow can affect the number, mixture, and maturity of the cells, and can affect its fibrous structure. PREPARATION FOR TEST  No special preparation is needed. The bone marrow aspiration or biopsy procedure is performed by a caregiver. Both types of samples may be collected from the hip bone (pelvis), and marrow aspirations may be collected from the breastbone (sternum). In children, samples may also be collected from a vertebrae in the back or from the thigh bone (femur).   The most common collection site is the top ridge of the hip bone (iliac crest). Before the procedure, your blood pressure, heart rate, and temperature are measured and evaluated to make sure that they are within normal limits. You may be given a mild sedative. You will be asked to lie down on your stomach or side. Your lower body is draped with cloths so that only the area surrounding the site is exposed.   The site is cleaned with an antiseptic and injected with a local anesthetic. When the site is numb, the caregiver inserts a needle through the skin and into the bone. For an aspiration, the caregiver attaches a syringe to the needle and pulls back on the plunger. This creates vacuum pressure and pulls a small amount of marrow into the syringe.   For a bone marrow biopsy, the caregiver uses a special needle that allows the collection of a sample of bone and marrow. Even though your skin has been numbed,  you may feel brief but uncomfortable pressure sensations during these procedures. After the needle has been withdrawn, a sterile bandage is placed over the site and pressure is applied. You will then usually be instructed to lie quietly until your blood pressure, heart rate, and temperature are normal. You may be instructed to keep the site dry and covered for 48 hours.   Complications from the bone marrow aspiration  or biopsy procedure are rare, but some individuals may have excessive bleeding at the site or develop an infection.   Tell your caregiver about any allergies you have and about any medications or supplements you are taking prior to the procedure.  NORMAL FINDINGS Cell type / Range (%)  Myeloblasts / less than 5   Promyelocytes / 1-8   Myelocytes   Neutrophilic / 5-15   Eosinophilic / 0.5-3   Basophilic / less than 1   Metamyelocytes   Neutrophilic / 15-25   Eosinophilic / less than 1   Basophilic / less than 1   Mature myelocytes   Neutrophilic / 10-30   Eosinophilic / less than 5   Basophilic / less than 5   Mononuclear   Monocytes / less than 5   Lymphocytes / 3-20   Plasma cells / less than 1   Megakaryocytes / less than 5   M/E ratio / less than 4   Normoblasts / 25-50  Normal iron content is demonstrated by staining with Prussian blue. Ranges for normal findings may vary among different laboratories and hospitals. You should always check with your caregiver after having lab work or other tests done to discuss the meaning of your test results and whether your values are considered within normal limits. MEANING OF TEST  Your caregiver will go over the test results with you and discuss the importance and meaning of your results, as well as treatment options and the need for additional tests if necessary. OBTAINING THE TEST RESULTS It is your responsibility to obtain your test results. Ask the lab or department performing the test when and how you will get your results. Document Released: 04/27/2004 Document Revised: 03/24/2011 Document Reviewed: 03/11/2008 [facility] Patient Information 2012 [place], [place].

## 2011-10-18 NOTE — Procedures (Signed)
   [facility]  Telephone:(336) [phone] Fax:(336) [phone]   BONE MARROW BIOPSY AND ASPIRATION   INDICATION:  Procedure: After obtained from consent, [name] was placed in the prone position. Time out was performed verifying correct patient and procedure. The skin overlying the left posterior crest was prepped with Betadine and draped in the usual sterile fashion. The skin and periosteum were infiltrated with 10 mL of 2% lidocaine x3 as she was still uncomfortable after the first 20mL. A small puncture wound was made with #11 scalpel blade.  Bone marrow aspirate was obtained on the first pass of the aspiration needle.  Two separate core biopsies were obtained through the same incision as the first one was subcentimeter.   The aspirate was sent for routine histology, flow cytometry, MDS FISH panel, and cytogenetics.  Core biopsy was sent for routine histology.   [name] tolerated procedure well with minimal  blood loss and without immediate complication.   A sterile dressing was applied.   Jethro Bolus M.D. 10/18/2011

## 2011-10-18 NOTE — Progress Notes (Signed)
Please see Bone Marrow Procedure dated same day.

## 2011-10-21 ENCOUNTER — Telehealth: Payer: Self-pay | Admitting: Oncology

## 2011-10-21 LAB — PROTEIN ELECTROPHORESIS, SERUM, WITH REFLEX
Albumin ELP: 59.3 % (ref 55.8–66.1)
Beta Globulin: 6.3 % (ref 4.7–7.2)
Total Protein, Serum Electrophoresis: 7.2 g/dL (ref 6.0–8.3)

## 2011-10-21 LAB — SEDIMENTATION RATE: Sed Rate: 75 mm/hr — ABNORMAL HIGH (ref 0–22)

## 2011-10-21 LAB — HEMOGLOBINOPATHY EVALUATION
Hgb A2 Quant: 2.5 % (ref 2.2–3.2)
Hgb F Quant: 0.3 % (ref 0.0–2.0)

## 2011-10-21 LAB — METHYLMALONIC ACID, SERUM: Methylmalonic Acid, Quant: 0.46 umol/L — ABNORMAL HIGH (ref <0.40)

## 2011-10-21 NOTE — Telephone Encounter (Signed)
I talked with [name] on the phone today regarding her lab result and bone marrow biopsy.   Her methylmalonic acid was slightly elevated signifying mild Vitamin 12 deficiency.  Her bone marrow biopsy on 10/18/2011 showed dysplastic change along with peripheral monocytosis.  There was concerned for myelodysplastic/myeloproliferative state such as chronic myelomonocytic leukemia (CMML).  There were only 2% blast on bone marrow aspirate.   We're waiting for cytogenetics to rule out significant mutations.  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   Plan: - Proceed with hip replacement as planned for 10/25/11 - When she comes back, I will start Vitamin B12 injection as Vitamin B12 deficiency can result in dysplastic changes in bone marrow biopsy. - Definitive treatment for CMML is contingent on her cytogenetics which should be out in about 3 weeks.   But this should not preclude her from proceeding with her hip replacement from hematology standpoint.

## 2011-10-24 ENCOUNTER — Encounter (HOSPITAL_COMMUNITY): Payer: Self-pay | Admitting: Pharmacy Technician

## 2011-10-24 ENCOUNTER — Encounter (HOSPITAL_COMMUNITY)
Admission: RE | Admit: 2011-10-24 | Discharge: 2011-10-24 | Disposition: A | Discharge status: Home / Self Care | Payer: BC Managed Care – PPO | Source: Ambulatory Visit | Attending: Orthopaedic Surgery | Admitting: Orthopaedic Surgery

## 2011-10-24 LAB — URINALYSIS, ROUTINE W REFLEX MICROSCOPIC
Leukocytes, UA: NEGATIVE
Nitrite: NEGATIVE
Protein, ur: NEGATIVE mg/dL
Urobilinogen, UA: 1 mg/dL (ref 0.0–1.0)

## 2011-10-24 LAB — BASIC METABOLIC PANEL
BUN: 15 mg/dL (ref 6–23)
CO2: 21 mEq/L (ref 19–32)
Chloride: 98 mEq/L (ref 96–112)
GFR calc Af Amer: >90 mL/min (ref >90)
Glucose, Bld: 104 mg/dL — ABNORMAL HIGH (ref 70–99)
Potassium: 4.6 mEq/L (ref 3.5–5.1)

## 2011-10-24 MED ORDER — ACETAMINOPHEN 10 MG/ML IV SOLN
1000.0000 mg | Freq: Once | INTRAVENOUS | Status: AC
  Administered 2011-10-25: 1000 mg via INTRAVENOUS
  Filled 2011-10-24: qty 100

## 2011-10-24 MED ORDER — CEFAZOLIN SODIUM-DEXTROSE 2-3 GM-% IV SOLR
2.0000 g | INTRAVENOUS | Status: DC
  Filled 2011-10-24: qty 50

## 2011-10-24 MED ORDER — CHLORHEXIDINE GLUCONATE 4 % EX LIQD: 60.0000 mL | Freq: Every day | CUTANEOUS | Status: DC

## 2011-10-24 MED ORDER — CHLORHEXIDINE GLUCONATE 4 % EX LIQD: 60.0000 mL | Freq: Once | CUTANEOUS | Status: DC

## 2011-10-24 MED ORDER — SODIUM CHLORIDE 0.9 % IV SOLN: INTRAVENOUS | Status: DC

## 2011-10-24 NOTE — H&P (Signed)
CHIEF COMPLAINT:  Painful left hip.  HPI:     [name] is a very pleasant 61 year old female who has been followed by our practice for left hip pain.  She has had multiple cortisone injections to the hip over the last several months which have had some benefit to her.  However, more recently her pain is worsening.  Her last injection was in March of 2013 which did not really have much of a benefit at all.  She is now having this constant, severe, throbbing, sharp pain with loss of motion in the hip.  It is worsening.  It is now starting to wake her at nighttime.  Activity and exercise worsens her symptoms.  Rest, Celebrex, and Lortab have been helpful.  She is now to the point where she uses a Lortab at times just to be able to function.  She is being seen today for evaluation.  PAST MEDICAL HISTORY:   1962 appendectomy. 1975 tonsillectomy. 1987 tubal ligation. 1988 vaginal hysterectomy. 1991 small bowel obstruction with resection. 1999 small bowel obstruction. 1994 ventral hernia repair. 2008 I&D of the left knee.  PAST SURGICAL HISTORY:  As above.  HOSPITALIZATIONS:  Ischemic colitis in 2013 and sequenced later with a small bowel obstruction.  MEDICATIONS:   Celebrex 100 mg daily. Estrace 2 mg daily. Wellbutrin XL 300 mg daily. Benazepril 20 mg daily. HCTZ 25 mg daily. Bisoprolol 5 mg daily. Prevacid 10 mg daily.  ALLERGIES:  Tetanus for a serum reaction and penicillin which gives her rash. She, however, is able to take Ancef without difficulty.  ROS:     Fourteen point review of systems is positive negative except for pneumonia in January 2013.  She developed hypertension when she was on birth control pills in her 46s, but it is now controlled.  She does have occasional constipation.  She had had small bowel obstructions and ischemic colitis in 2013.  Past history of scarlet fever.  She does have vasoconstriction of the hands and feet, but has been told by vascular it is not Raynaud's  phenomenon.  She does have an anemia, but they are not sure what the etiology is.  She will be seeing the hematologist soon.  She does have headaches and migraines.  FAMILY HISTORY:   Positive for mother who is deceased at age 36 from hypertensive crisis.  She had heart disease, hypertension, strokes, and kidney disease; father who died at age 32 from embolic stroke.  He had heart disease, hypertension, diabetes, and renal disease.  She has no brothers.  She has a sister who is doing well at age 82.   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  She smokes about  PPD.  She is trying to stop smoking. She does drink once per week; that of one to two glasses of wine.  EXAMINATION: General:  61 year old white female.  Well developed, well nourished, alert, pleasant and cooperative.  She is in moderate distress to left hip pain.  Height 5 feet, 7inches  Weight 170 pounds.  BMI 27.4.  Temperature 97.7 degrees, pulse 83, respirations 16, blood pressure 137/88.  Head:  Normocephalic. Eyes:  Pupils equal, round, and reactive to light and accommodation.  EOMs intact. Neck:  Supple.  No bruits noted. Chest:  Fair expansion. Lungs:  Decreased breath sounds, but clear. Cardiac:  Regular rhythm, rate.  Normal S1-S2.  No murmurs noted. Pulses:  Trace positive at the posterior tibs bilaterally.  There was cyanosis over both feet. Abdomen:  Scaphoid soft, nontender, no masses palpable. Normal bowel sounds present. CNS:  Oriented x 3.  Cranial nerves 2-12 grossly intact. Skin:  Intact without ecchymosis, but over the feet and hands does have some cyanosis, but capillary refill is less than 2 seconds. Musculoskeletal: she has limited range of motion of the left hip. She complains of the fact that she has difficulty washing her feet. I can get about 10 degrees of internal rotation and 20 degrees of external rotation  IMPRESSION:    1. End-stage  osteoarthritis of the left hip. 2. Anemia, etiology unknown. ? Vitamin B12 deficiency 3. Hypertension. 4. History of multiple small bowel obstructions. 5. History of ischemic colitis. 6. 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 At this time, I have reviewed a preoperative clearance by Dr. Patty Sermons who felt that she was a candidate for surgery from a cardiac standpoint.  Dr. Randa Evens felt that she was cleared medically for surgery.  She does have anemia and appears to be macrocytic, but they are not sure.  2. At this time, we are going to consider having a total hip replacement on the left.  The procedure risks and benefits were fully explained to her.  She is understanding and would like to proceed.  I have answered all of her questions.  We will send her over to [facility] for blood work, EKG, and chest x-ray.  3. She is going to follow back up with hematology/oncology next week for evaluation. 4. If her lab work is normal, her EKG and chest x-ray are good, and the oncologist says it is all right to proceed, we will then plan on doing her left total hip arthroplasty. 5. Dr Gaylyn Rong has given clearance to perform THR with possible CMML.

## 2011-10-24 NOTE — Progress Notes (Addendum)
Per blood bank pt needs repeat T&S due to + antibodies. They requested that pt come back in for second T&S today if possible since pt is a 0730 case; however have been unable to contact pt. Spoke with Moishe Spice for Dr. Cleophas Dunker, regarding same so he is aware of possible issues with T&S and he is looking at schedule for possibility of changing surgery time.

## 2011-10-24 NOTE — Progress Notes (Signed)
Patient here for lab appointment and BP recheck.  BP 122/84. T&S, urinalysis, urine culture and BMET  Obtained, and BP reported to Shonna Chock, PA-C

## 2011-10-25 ENCOUNTER — Encounter (HOSPITAL_COMMUNITY)
Admission: RE | Disposition: A | Discharge status: Home / Self Care | Payer: Self-pay | Source: Ambulatory Visit | Attending: Orthopaedic Surgery

## 2011-10-25 ENCOUNTER — Inpatient Hospital Stay (HOSPITAL_COMMUNITY): Payer: BC Managed Care – PPO

## 2011-10-25 ENCOUNTER — Encounter (HOSPITAL_COMMUNITY): Payer: Self-pay | Admitting: Vascular Surgery

## 2011-10-25 ENCOUNTER — Inpatient Hospital Stay (HOSPITAL_COMMUNITY)
Admission: RE | Admit: 2011-10-25 | Discharge: 2011-10-28 | DRG: 818 | Disposition: A | Discharge status: Home / Self Care | Payer: BC Managed Care – PPO | Source: Ambulatory Visit | Attending: Orthopaedic Surgery | Admitting: Orthopaedic Surgery

## 2011-10-25 ENCOUNTER — Ambulatory Visit (HOSPITAL_COMMUNITY): Payer: BC Managed Care – PPO | Admitting: Vascular Surgery

## 2011-10-25 DIAGNOSIS — Z9089 Acquired absence of other organs: Secondary | ICD-10-CM

## 2011-10-25 DIAGNOSIS — K560 Paralytic ileus: Secondary | ICD-10-CM | POA: Diagnosis not present

## 2011-10-25 DIAGNOSIS — D62 Acute posthemorrhagic anemia: Secondary | ICD-10-CM | POA: Diagnosis not present

## 2011-10-25 DIAGNOSIS — E669 Obesity, unspecified: Secondary | ICD-10-CM | POA: Diagnosis present

## 2011-10-25 DIAGNOSIS — M169 Osteoarthritis of hip, unspecified: Principal | ICD-10-CM | POA: Diagnosis present

## 2011-10-25 DIAGNOSIS — K567 Ileus, unspecified: Secondary | ICD-10-CM | POA: Diagnosis not present

## 2011-10-25 DIAGNOSIS — Z823 Family history of stroke: Secondary | ICD-10-CM

## 2011-10-25 DIAGNOSIS — Z6831 Body mass index (BMI) 31.0-31.9, adult: Secondary | ICD-10-CM

## 2011-10-25 DIAGNOSIS — K219 Gastro-esophageal reflux disease without esophagitis: Secondary | ICD-10-CM | POA: Diagnosis present

## 2011-10-25 DIAGNOSIS — E871 Hypo-osmolality and hyponatremia: Secondary | ICD-10-CM | POA: Diagnosis not present

## 2011-10-25 DIAGNOSIS — Z8249 Family history of ischemic heart disease and other diseases of the circulatory system: Secondary | ICD-10-CM

## 2011-10-25 DIAGNOSIS — F17200 Nicotine dependence, unspecified, uncomplicated: Secondary | ICD-10-CM | POA: Diagnosis present

## 2011-10-25 DIAGNOSIS — I10 Essential (primary) hypertension: Secondary | ICD-10-CM | POA: Diagnosis present

## 2011-10-25 DIAGNOSIS — Z01812 Encounter for preprocedural laboratory examination: Secondary | ICD-10-CM

## 2011-10-25 DIAGNOSIS — Z8614 Personal history of Methicillin resistant Staphylococcus aureus infection: Secondary | ICD-10-CM

## 2011-10-25 DIAGNOSIS — M1610 Unilateral primary osteoarthritis, unspecified hip: Principal | ICD-10-CM | POA: Diagnosis present

## 2011-10-25 DIAGNOSIS — Z880 Allergy status to penicillin: Secondary | ICD-10-CM

## 2011-10-25 HISTORY — PX: TOTAL HIP ARTHROPLASTY: SHX124

## 2011-10-25 LAB — URINE CULTURE: Colony Count: 80000

## 2011-10-25 LAB — TYPE AND SCREEN
DAT, IgG: POSITIVE
Unit division: 0

## 2011-10-25 SURGERY — ARTHROPLASTY, HIP, TOTAL,POSTERIOR APPROACH
Anesthesia: General | Site: Hip | Laterality: Left | Wound class: Clean

## 2011-10-25 MED ORDER — SODIUM CHLORIDE 0.9 % IR SOLN
Status: DC | PRN
  Administered 2011-10-25 (×2): 1000 mL

## 2011-10-25 MED ORDER — SODIUM CHLORIDE 0.9 % IV SOLN
75.0000 mL/h | INTRAVENOUS | Status: DC
  Administered 2011-10-25: 75 mL/h via INTRAVENOUS

## 2011-10-25 MED ORDER — ONDANSETRON HCL 4 MG/2ML IJ SOLN: 4.0000 mg | Freq: Once | INTRAMUSCULAR | Status: DC | PRN

## 2011-10-25 MED ORDER — PSEUDOEPHEDRINE HCL ER 120 MG PO TB12
120.0000 mg | ORAL_TABLET | Freq: Two times a day (BID) | ORAL | Status: DC
  Administered 2011-10-26 – 2011-10-28 (×5): 120 mg via ORAL
  Filled 2011-10-25 (×7): qty 1

## 2011-10-25 MED ORDER — MONTELUKAST SODIUM 10 MG PO TABS
10.0000 mg | ORAL_TABLET | Freq: Every day | ORAL | Status: DC | PRN
  Filled 2011-10-25: qty 1

## 2011-10-25 MED ORDER — PANTOPRAZOLE SODIUM 20 MG PO TBEC
20.0000 mg | DELAYED_RELEASE_TABLET | Freq: Every day | ORAL | Status: DC
  Administered 2011-10-26 – 2011-10-28 (×3): 20 mg via ORAL
  Filled 2011-10-25 (×5): qty 1

## 2011-10-25 MED ORDER — GLYCOPYRROLATE 0.2 MG/ML IJ SOLN
INTRAMUSCULAR | Status: DC | PRN
  Administered 2011-10-25: 0.6 mg via INTRAVENOUS

## 2011-10-25 MED ORDER — DIPHENHYDRAMINE HCL 12.5 MG/5ML PO ELIX
12.5000 mg | ORAL_SOLUTION | ORAL | Status: DC | PRN
  Administered 2011-10-27: 12.5 mg via ORAL

## 2011-10-25 MED ORDER — SODIUM CHLORIDE 0.9 % IV SOLN
1000.0000 mg | INTRAVENOUS | Status: DC | PRN
  Administered 2011-10-25: 1000 mg via INTRAVENOUS

## 2011-10-25 MED ORDER — SUFENTANIL CITRATE 50 MCG/ML IV SOLN
INTRAVENOUS | Status: DC | PRN
  Administered 2011-10-25 (×2): 5 ug via INTRAVENOUS
  Administered 2011-10-25 (×2): 10 ug via INTRAVENOUS

## 2011-10-25 MED ORDER — BUPIVACAINE-EPINEPHRINE 0.5% -1:200000 IJ SOLN
INTRAMUSCULAR | Status: DC | PRN
  Administered 2011-10-25: 20 mL

## 2011-10-25 MED ORDER — RIVAROXABAN 10 MG PO TABS
10.0000 mg | ORAL_TABLET | ORAL | Status: DC
  Administered 2011-10-26 – 2011-10-28 (×3): 10 mg via ORAL
  Filled 2011-10-25 (×3): qty 1

## 2011-10-25 MED ORDER — BUPROPION HCL ER (XL) 300 MG PO TB24
300.0000 mg | ORAL_TABLET | Freq: Every day | ORAL | Status: DC
  Administered 2011-10-26 – 2011-10-28 (×3): 300 mg via ORAL
  Filled 2011-10-25 (×5): qty 1

## 2011-10-25 MED ORDER — METOCLOPRAMIDE HCL 10 MG PO TABS: 5.0000 mg | ORAL_TABLET | Freq: Three times a day (TID) | ORAL | Status: DC | PRN

## 2011-10-25 MED ORDER — VANCOMYCIN HCL IN DEXTROSE 1-5 GM/200ML-% IV SOLN
1000.0000 mg | Freq: Two times a day (BID) | INTRAVENOUS | Status: AC
  Administered 2011-10-25 – 2011-10-27 (×4): 1000 mg via INTRAVENOUS
  Filled 2011-10-25 (×4): qty 200

## 2011-10-25 MED ORDER — BENAZEPRIL HCL 20 MG PO TABS
20.0000 mg | ORAL_TABLET | Freq: Every day | ORAL | Status: DC
  Administered 2011-10-25 – 2011-10-28 (×3): 20 mg via ORAL
  Filled 2011-10-25 (×4): qty 1

## 2011-10-25 MED ORDER — KETOROLAC TROMETHAMINE 30 MG/ML IJ SOLN
INTRAMUSCULAR | Status: AC
  Administered 2011-10-25: 15 mg
  Filled 2011-10-25: qty 1

## 2011-10-25 MED ORDER — ONDANSETRON HCL 4 MG PO TABS: 4.0000 mg | ORAL_TABLET | Freq: Four times a day (QID) | ORAL | Status: DC | PRN

## 2011-10-25 MED ORDER — HYDROMORPHONE HCL PF 1 MG/ML IJ SOLN
0.2500 mg | INTRAMUSCULAR | Status: DC | PRN
  Administered 2011-10-25 (×2): 0.5 mg via INTRAVENOUS

## 2011-10-25 MED ORDER — MUPIROCIN 2 % EX OINT
TOPICAL_OINTMENT | Freq: Two times a day (BID) | CUTANEOUS | Status: DC
  Administered 2011-10-25: 1 via NASAL
  Administered 2011-10-25 – 2011-10-27 (×5): via NASAL
  Filled 2011-10-25: qty 22

## 2011-10-25 MED ORDER — METHOCARBAMOL 500 MG PO TABS
500.0000 mg | ORAL_TABLET | Freq: Four times a day (QID) | ORAL | Status: DC | PRN
  Administered 2011-10-25 – 2011-10-28 (×6): 500 mg via ORAL
  Filled 2011-10-25 (×7): qty 1

## 2011-10-25 MED ORDER — VANCOMYCIN HCL IN DEXTROSE 1-5 GM/200ML-% IV SOLN
INTRAVENOUS | Status: AC
  Filled 2011-10-25: qty 200

## 2011-10-25 MED ORDER — LORATADINE-PSEUDOEPHEDRINE ER 10-240 MG PO TB24: 1.0000 | ORAL_TABLET | Freq: Every day | ORAL | Status: DC

## 2011-10-25 MED ORDER — LACTATED RINGERS IV SOLN
INTRAVENOUS | Status: DC
  Administered 2011-10-25: 10:00:00 via INTRAVENOUS

## 2011-10-25 MED ORDER — MENTHOL 3 MG MT LOZG: 1.0000 | LOZENGE | OROMUCOSAL | Status: DC | PRN

## 2011-10-25 MED ORDER — DOCUSATE SODIUM 100 MG PO CAPS
100.0000 mg | ORAL_CAPSULE | Freq: Two times a day (BID) | ORAL | Status: DC
  Administered 2011-10-25 – 2011-10-28 (×6): 100 mg via ORAL
  Filled 2011-10-25 (×8): qty 1

## 2011-10-25 MED ORDER — METOCLOPRAMIDE HCL 5 MG/ML IJ SOLN
5.0000 mg | Freq: Three times a day (TID) | INTRAMUSCULAR | Status: DC | PRN
  Administered 2011-10-25 – 2011-10-26 (×2): 10 mg via INTRAVENOUS
  Filled 2011-10-25 (×2): qty 2

## 2011-10-25 MED ORDER — ESTRADIOL 2 MG PO TABS
2.0000 mg | ORAL_TABLET | Freq: Every day | ORAL | Status: DC
  Administered 2011-10-26 – 2011-10-28 (×3): 2 mg via ORAL
  Filled 2011-10-25 (×4): qty 1

## 2011-10-25 MED ORDER — NEOSTIGMINE METHYLSULFATE 1 MG/ML IJ SOLN
INTRAMUSCULAR | Status: DC | PRN
  Administered 2011-10-25: 4 mg via INTRAVENOUS

## 2011-10-25 MED ORDER — BISACODYL 10 MG RE SUPP: 10.0000 mg | Freq: Every day | RECTAL | Status: DC | PRN

## 2011-10-25 MED ORDER — DEXTROSE 5 % IV SOLN
500.0000 mg | Freq: Four times a day (QID) | INTRAVENOUS | Status: DC | PRN
  Administered 2011-10-25: 500 mg via INTRAVENOUS
  Filled 2011-10-25: qty 5

## 2011-10-25 MED ORDER — PHENOL 1.4 % MT LIQD
1.0000 | OROMUCOSAL | Status: DC | PRN
Start: 2011-10-25 — End: 2011-10-28

## 2011-10-25 MED ORDER — PROPOFOL 10 MG/ML IV EMUL
INTRAVENOUS | Status: DC | PRN
  Administered 2011-10-25: 200 mg via INTRAVENOUS

## 2011-10-25 MED ORDER — ROCURONIUM BROMIDE 100 MG/10ML IV SOLN
INTRAVENOUS | Status: DC | PRN
  Administered 2011-10-25: 50 mg via INTRAVENOUS
  Administered 2011-10-25: 10 mg via INTRAVENOUS

## 2011-10-25 MED ORDER — OXYCODONE HCL 5 MG PO TABS
ORAL_TABLET | ORAL | Status: AC
  Filled 2011-10-25: qty 2

## 2011-10-25 MED ORDER — ACETAMINOPHEN 10 MG/ML IV SOLN
INTRAVENOUS | Status: AC
  Filled 2011-10-25: qty 100

## 2011-10-25 MED ORDER — SENNOSIDES-DOCUSATE SODIUM 8.6-50 MG PO TABS: 1.0000 | ORAL_TABLET | Freq: Every evening | ORAL | Status: DC | PRN

## 2011-10-25 MED ORDER — MIDAZOLAM HCL 5 MG/5ML IJ SOLN
INTRAMUSCULAR | Status: DC | PRN
  Administered 2011-10-25 (×2): 1 mg via INTRAVENOUS

## 2011-10-25 MED ORDER — LORATADINE 10 MG PO TABS
10.0000 mg | ORAL_TABLET | Freq: Every day | ORAL | Status: DC
  Administered 2011-10-26 – 2011-10-28 (×3): 10 mg via ORAL
  Filled 2011-10-25 (×4): qty 1

## 2011-10-25 MED ORDER — ALUM & MAG HYDROXIDE-SIMETH 200-200-20 MG/5ML PO SUSP: 30.0000 mL | ORAL | Status: DC | PRN

## 2011-10-25 MED ORDER — ONDANSETRON HCL 4 MG/2ML IJ SOLN
4.0000 mg | Freq: Four times a day (QID) | INTRAMUSCULAR | Status: DC | PRN
  Administered 2011-10-25 – 2011-10-26 (×3): 4 mg via INTRAVENOUS
  Filled 2011-10-25 (×3): qty 2

## 2011-10-25 MED ORDER — HYDROMORPHONE HCL PF 1 MG/ML IJ SOLN
0.5000 mg | INTRAMUSCULAR | Status: DC | PRN
  Administered 2011-10-25 – 2011-10-27 (×6): 0.5 mg via INTRAVENOUS
  Filled 2011-10-25 (×6): qty 1

## 2011-10-25 MED ORDER — FENTANYL CITRATE 0.05 MG/ML IJ SOLN
INTRAMUSCULAR | Status: AC
  Filled 2011-10-25: qty 2

## 2011-10-25 MED ORDER — BISOPROLOL FUMARATE 5 MG PO TABS
5.0000 mg | ORAL_TABLET | Freq: Every day | ORAL | Status: DC
  Administered 2011-10-26 – 2011-10-28 (×3): 5 mg via ORAL
  Filled 2011-10-25 (×5): qty 1

## 2011-10-25 MED ORDER — ZOLPIDEM TARTRATE 5 MG PO TABS
5.0000 mg | ORAL_TABLET | Freq: Every evening | ORAL | Status: DC | PRN
  Filled 2011-10-25: qty 1

## 2011-10-25 MED ORDER — HYDROMORPHONE HCL PF 1 MG/ML IJ SOLN
INTRAMUSCULAR | Status: AC
  Filled 2011-10-25: qty 1

## 2011-10-25 MED ORDER — FLEET ENEMA 7-19 GM/118ML RE ENEM: 1.0000 | ENEMA | Freq: Once | RECTAL | Status: AC | PRN

## 2011-10-25 MED ORDER — SODIUM CHLORIDE 0.9 % IV SOLN
10.0000 mg | INTRAVENOUS | Status: DC | PRN
  Administered 2011-10-25: 40 ug/min via INTRAVENOUS

## 2011-10-25 MED ORDER — LIDOCAINE HCL (CARDIAC) 20 MG/ML IV SOLN
INTRAVENOUS | Status: DC | PRN
  Administered 2011-10-25: 80 mg via INTRAVENOUS

## 2011-10-25 MED ORDER — LACTATED RINGERS IV SOLN
INTRAVENOUS | Status: DC | PRN
  Administered 2011-10-25 (×2): via INTRAVENOUS

## 2011-10-25 MED ORDER — PHENYLEPHRINE HCL 10 MG/ML IJ SOLN
INTRAMUSCULAR | Status: DC | PRN
  Administered 2011-10-25: 40 ug via INTRAVENOUS
  Administered 2011-10-25: 80 ug via INTRAVENOUS
  Administered 2011-10-25 (×7): 40 ug via INTRAVENOUS

## 2011-10-25 MED ORDER — BUPIVACAINE-EPINEPHRINE (PF) 0.5% -1:200000 IJ SOLN
INTRAMUSCULAR | Status: AC
  Filled 2011-10-25: qty 10

## 2011-10-25 MED ORDER — ONDANSETRON HCL 4 MG/2ML IJ SOLN
INTRAMUSCULAR | Status: DC | PRN
  Administered 2011-10-25: 4 mg via INTRAVENOUS

## 2011-10-25 MED ORDER — KETOROLAC TROMETHAMINE 15 MG/ML IJ SOLN
15.0000 mg | Freq: Four times a day (QID) | INTRAMUSCULAR | Status: AC
  Administered 2011-10-25 – 2011-10-26 (×3): 15 mg via INTRAVENOUS
  Filled 2011-10-25 (×3): qty 1

## 2011-10-25 MED ORDER — OXYCODONE HCL 5 MG PO TABS
5.0000 mg | ORAL_TABLET | ORAL | Status: DC | PRN
  Administered 2011-10-25 (×2): 10 mg via ORAL
  Filled 2011-10-25 (×3): qty 2

## 2011-10-25 MED ORDER — ACETAMINOPHEN 10 MG/ML IV SOLN
1000.0000 mg | Freq: Four times a day (QID) | INTRAVENOUS | Status: DC
  Administered 2011-10-25 – 2011-10-26 (×3): 1000 mg via INTRAVENOUS
  Filled 2011-10-25 (×6): qty 100

## 2011-10-25 MED ORDER — MIDAZOLAM HCL 2 MG/2ML IJ SOLN
INTRAMUSCULAR | Status: AC
  Filled 2011-10-25: qty 2

## 2011-10-25 SURGICAL SUPPLY — 64 items
BLADE SAW SAG 73X25 THK (BLADE) ×1
BLADE SAW SGTL 73X25 THK (BLADE) ×1 IMPLANT
BRUSH FEMORAL CANAL (MISCELLANEOUS) IMPLANT
CLOTH BEACON ORANGE TIMEOUT ST (SAFETY) ×2 IMPLANT
COVER BACK TABLE 24X17X13 BIG (DRAPES) IMPLANT
COVER SURGICAL LIGHT HANDLE (MISCELLANEOUS) ×2 IMPLANT
DRAPE INCISE IOBAN 66X45 STRL (DRAPES) ×1 IMPLANT
DRAPE ORTHO SPLIT 77X108 STRL (DRAPES) ×4
DRAPE SURG ORHT 6 SPLT 77X108 (DRAPES) ×2 IMPLANT
DRSG MEPILEX BORDER 4X12 (GAUZE/BANDAGES/DRESSINGS) ×2 IMPLANT
DURAPREP 26ML APPLICATOR (WOUND CARE) ×2 IMPLANT
ELECT BLADE 6.5 EXT (BLADE) ×1 IMPLANT
ELECT REM PT RETURN 9FT ADLT (ELECTROSURGICAL) ×2
ELECTRODE REM PT RTRN 9FT ADLT (ELECTROSURGICAL) ×1 IMPLANT
EVACUATOR 1/8 PVC DRAIN (DRAIN) IMPLANT
FACESHIELD LNG OPTICON STERILE (SAFETY) ×5 IMPLANT
GLOVE BIOGEL PI IND STRL 7.0 (GLOVE) IMPLANT
GLOVE BIOGEL PI IND STRL 8 (GLOVE) ×2 IMPLANT
GLOVE BIOGEL PI IND STRL 8.5 (GLOVE) IMPLANT
GLOVE BIOGEL PI INDICATOR 7.0 (GLOVE) ×2
GLOVE BIOGEL PI INDICATOR 8 (GLOVE) ×1
GLOVE BIOGEL PI INDICATOR 8.5 (GLOVE) ×1
GLOVE ECLIPSE 8.0 STRL XLNG CF (GLOVE) ×4 IMPLANT
GLOVE SURG ORTHO 8.5 STRL (GLOVE) ×4 IMPLANT
GLOVE SURG SS PI 6.5 STRL IVOR (GLOVE) ×2 IMPLANT
GLOVE SURG SS PI 7.0 STRL IVOR (GLOVE) ×2 IMPLANT
GOWN PREVENTION PLUS XLARGE (GOWN DISPOSABLE) ×5 IMPLANT
GOWN STRL NON-REIN LRG LVL3 (GOWN DISPOSABLE) ×2 IMPLANT
GOWN STRL REIN 2XL LVL4 (GOWN DISPOSABLE) ×1 IMPLANT
HANDPIECE INTERPULSE COAX TIP (DISPOSABLE)
IMMOBILIZER KNEE 20 (SOFTGOODS)
IMMOBILIZER KNEE 20 THIGH 36 (SOFTGOODS) IMPLANT
IMMOBILIZER KNEE 22 UNIV (SOFTGOODS) ×1 IMPLANT
IMMOBILIZER KNEE 24 THIGH 36 (MISCELLANEOUS) IMPLANT
IMMOBILIZER KNEE 24 UNIV (MISCELLANEOUS)
KIT BASIN OR (CUSTOM PROCEDURE TRAY) ×2 IMPLANT
KIT ROOM TURNOVER OR (KITS) ×2 IMPLANT
MANIFOLD NEPTUNE II (INSTRUMENTS) ×2 IMPLANT
NEEDLE 22X1 1/2 (OR ONLY) (NEEDLE) ×2 IMPLANT
NS IRRIG 1000ML POUR BTL (IV SOLUTION) ×2 IMPLANT
PACK TOTAL JOINT (CUSTOM PROCEDURE TRAY) ×2 IMPLANT
PAD ARMBOARD 7.5X6 YLW CONV (MISCELLANEOUS) ×4 IMPLANT
PRESSURIZER FEMORAL UNIV (MISCELLANEOUS) IMPLANT
SET HNDPC FAN SPRY TIP SCT (DISPOSABLE) IMPLANT
SLEEVE SURGEON STRL (DRAPES) ×1 IMPLANT
STAPLER VISISTAT 35W (STAPLE) ×2 IMPLANT
SUCTION FRAZIER TIP 10 FR DISP (SUCTIONS) ×2 IMPLANT
SUT BONE WAX W31G (SUTURE) ×1 IMPLANT
SUT ETHIBOND NAB CT1 #1 30IN (SUTURE) ×6 IMPLANT
SUT MNCRL AB 3-0 PS2 18 (SUTURE) ×1 IMPLANT
SUT VIC AB 0 CT1 27 (SUTURE) ×4
SUT VIC AB 0 CT1 27XBRD ANBCTR (SUTURE) ×3 IMPLANT
SUT VIC AB 1 CT1 27 (SUTURE) ×4
SUT VIC AB 1 CT1 27XBRD ANBCTR (SUTURE) IMPLANT
SUT VIC AB 2-0 CT1 27 (SUTURE) ×4
SUT VIC AB 2-0 CT1 TAPERPNT 27 (SUTURE) ×1 IMPLANT
SWAB COLLECTION DEVICE MRSA (MISCELLANEOUS) ×1 IMPLANT
SYR CONTROL 10ML LL (SYRINGE) ×2 IMPLANT
TOWEL OR 17X24 6PK STRL BLUE (TOWEL DISPOSABLE) ×2 IMPLANT
TOWEL OR 17X26 10 PK STRL BLUE (TOWEL DISPOSABLE) ×2 IMPLANT
TOWER CARTRIDGE SMART MIX (DISPOSABLE) IMPLANT
TRAY FOLEY CATH 14FR (SET/KITS/TRAYS/PACK) ×2 IMPLANT
TUBE ANAEROBIC SPECIMEN COL (MISCELLANEOUS) ×1 IMPLANT
WATER STERILE IRR 1000ML POUR (IV SOLUTION) ×5 IMPLANT

## 2011-10-25 NOTE — Anesthesia Preprocedure Evaluation (Signed)
Anesthesia Evaluation  Patient identified by MRN, date of birth, ID band Patient awake    Reviewed: Allergy & Precautions, H&P , NPO status , Patient's Chart, lab work & pertinent test results, reviewed documented beta blocker date and time   Airway Mallampati: I TM Distance: >3 FB Neck ROM: Full    Dental  (+) Teeth Intact and Dental Advisory Given   Pulmonary  breath sounds clear to auscultation        Cardiovascular hypertension, Pt. on medications and Pt. on home beta blockers Rhythm:Regular Rate:Normal     Neuro/Psych    GI/Hepatic GERD-  Medicated and Controlled,  Endo/Other    Renal/GU      Musculoskeletal   Abdominal   Peds  Hematology  (+) Blood dyscrasia (see above), , Pt has antibodies to all testing of RBC's.  Will only use emergency release of "mildly compatible" units if Hb below 7.   Anesthesia Other Findings   Reproductive/Obstetrics                           Anesthesia Physical Anesthesia Plan  ASA: III  Anesthesia Plan: General   Post-op Pain Management:    Induction: Intravenous  Airway Management Planned: Oral ETT  Additional Equipment: CVP  Intra-op Plan:   Post-operative Plan: Extubation in OR  Informed Consent: I have reviewed the patients History and Physical, chart, labs and discussed the procedure including the risks, benefits and alternatives for the proposed anesthesia with the patient or authorized representative who has indicated his/her understanding and acceptance.   Dental advisory given  Plan Discussed with: CRNA, Anesthesiologist and Surgeon  Anesthesia Plan Comments:         Anesthesia Quick Evaluation

## 2011-10-25 NOTE — Progress Notes (Signed)
Patient ID: [name], female   DOB: May 01, 1950, 61 y.o.   MRN: [id number] There has been no change in health status since  the current H&P.I have examined the patient and discussed the surgery. No contraindications to the planned procedure exist.

## 2011-10-25 NOTE — Progress Notes (Signed)
ANTIBIOTIC CONSULT NOTE - INITIAL  Pharmacy Consult for vancomycin Indication: post-op prophylaxis  Allergies  Allergen Reactions  . Codeine Nausea And Vomiting  . Tetanus Toxoids Other (See Comments)    serum  . Penicillins Hives and Rash    Patient Measurements: Weight: 82.5kg   Vital Signs: Temp: 97.8 F (36.6 C) (07/09 1327) Temp src: Oral (07/09 0745) BP: 130/60 mmHg (07/09 1424) Pulse Rate: 61  (07/09 1424)  Labs:  Basename 10/24/11 1303  WBC --  HGB --  PLT --  LABCREA --  CREATININE 0.77   Crcl: est. 98.6 ml/min  Medical History: Past Medical History  Diagnosis Date  . Hypertension     She has a past hx of essential  . 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 Inflammation of joint of knee     Since we last last saw her she developed problems with an acute which required surgical drainage by her orthopedist Dr. Cleophas Dunker.  Marland Kitchen MRSA (methicillin resistant Staphylococcus aureus)     Knee surgery drainage was positive for MRSA and she was treated with 3 weeks of doxycycline successfully.  . Diarrhea     Mild  . Exogenous obesity   . Atrial flutter     during hospitalization, 04/2011- related to anemia & illness/stress   . Depression   . GERD (gastroesophageal reflux disease)     2 hosp.- ischemic colitis - residual of Norovirus, 05/2011- sm. bowel obstruction  . Headache     migraine headache on occas, less now than when she was younger   . Arthritis     L hip, back, neck   . History of blood transfusion     04/2011- /w ischemic colitis   . Anemia     will see hematology consult prior to surgery, recommended by Dr. Patty Sermons  . Pneumonia     04/2011- not hospitalized , pt. denies SOB, changes in chest, breathing    Medications:  Prescriptions prior to admission  Medication Sig Dispense Refill  . benazepril (LOTENSIN) 20 MG tablet Take 20 mg by mouth daily.      . bisoprolol  (ZEBETA) 5 MG tablet Take 5 mg by mouth daily before breakfast.      . buPROPion (WELLBUTRIN XL) 300 MG 24 hr tablet Take 300 mg by mouth daily before breakfast.       . Calcium Carbonate-Vitamin D (CALCIUM + D PO) Take 500 mg by mouth daily.        . celecoxib (CELEBREX) 200 MG capsule Take 200 mg by mouth daily before breakfast.       . cyclobenzaprine (FLEXERIL) 10 MG tablet Take 10 mg by mouth as needed. Muscle spasms      . estradiol (ESTRACE) 2 MG tablet Take 2 mg by mouth daily before breakfast.       . hydrochlorothiazide (HYDRODIURIL) 25 MG tablet Take 25 mg by mouth daily.      Marland Kitchen HYDROcodone-acetaminophen (LORTAB) 10-500 MG per tablet Take 0.5-2 tablets by mouth every 6 (six) hours as needed. For pain      . lansoprazole (PREVACID) 15 MG capsule Take 15 mg by mouth daily.      Marland Kitchen loratadine-pseudoephedrine (CLARITIN-D 24-HOUR) 10-240 MG per 24 hr tablet Take 1 tablet by mouth daily. For allergies.      . montelukast (SINGULAIR) 10 MG tablet Take 10 mg by mouth as needed. For  breathing nasal drainage congestion relief      . Multiple Vitamin (MULTIVITAMIN WITH MINERALS) TABS Take 1 tablet by mouth daily.       Scheduled:    . acetaminophen  1,000 mg Intravenous Once  . acetaminophen  1,000 mg Intravenous Q6H  . benazepril  20 mg Oral Daily  . bisoprolol  5 mg Oral QAC breakfast  . buPROPion  300 mg Oral QAC breakfast  . docusate sodium  100 mg Oral BID  . estradiol  2 mg Oral QAC breakfast  . HYDROmorphone      . ketorolac  15 mg Intravenous Q6H  . ketorolac      . loratadine  10 mg Oral Daily   And  . pseudoephedrine  120 mg Oral BID  . mupirocin ointment   Nasal BID  . oxyCODONE      . pantoprazole  20 mg Oral QAC breakfast  . rivaroxaban  10 mg Oral Q24H  . DISCONTD:  ceFAZolin (ANCEF) IV  2 g Intravenous 60 min Pre-Op  . DISCONTD: chlorhexidine  60 mL Topical Q2000  . DISCONTD: chlorhexidine  60 mL Topical Once  . DISCONTD: loratadine-pseudoephedrine  1 tablet Oral  Daily   Assessment: Patient is a 61 y/o female  s/p L THA. Pharmacy to dose vancomycin for post-op prophylaxis. Noted 1 g given 7/9 11am.   Goal of Therapy:  Vancomycin trough level 10-15 mcg/ml  Plan:  1. vancomycin 1g Q12H * 4 doses for 48H 2. Will follow renal function  [name] D Clinical Pharmacist Pager:[phone] Phone [phone] 10/25/2011 4:05 PM   I have reviewed the plan and agree with the above.  [name], Pharm D 10/25/2011 4:07 PM

## 2011-10-25 NOTE — Preoperative (Addendum)
Beta Blockers   Reason not to administer Beta Blockers: Zebeta 6 a.m. today

## 2011-10-25 NOTE — Op Note (Signed)
PATIENT ID:      Ronnald Collum Krakowski  MRN:     [id number] DOB/AGE:    [birth year] / 62 y.o.       OPERATIVE REPORT    DATE OF PROCEDURE:  10/25/2011       PREOPERATIVE DIAGNOSIS:   osteoarrthritis left hip                                                       Estimated Body mass index is 28.89 kg/(m^2) as calculated from the following:   Height as of 08/11/11: 5\' 6" (1.676 m).   Weight as of 08/11/11: 179 lb(81.194 kg).     POSTOPERATIVE DIAGNOSIS:   osteoarrthritis left hip                                                                     Estimated Body mass index is 28.89 kg/(m^2) as calculated from the following:   Height as of 08/11/11: 5\' 6" (1.676 m).   Weight as of 08/11/11: 179 lb(81.194 kg).     PROCEDURE:  Procedure(s):LEFT TOTAL HIP ARTHROPLASTY     SURGEON: Angelice Piech W    ASSISTANT:   Jacqualine Code, PA-C   (Present and scrubbed throughout the case, critical for assistance with exposure, retraction, instrumentation, and closure.)          ANESTHESIA: Regional      DRAINS: none :      TOURNIQUET TIME: * No tourniquets in log *    COMPLICATIONS:  None   CONDITION:  stable  PROCEDURE IN DETAIL: [id number]   Cleophas Dunker, Amous Crewe W 10/25/2011, 12:42 PM

## 2011-10-25 NOTE — Transfer of Care (Signed)
Immediate Anesthesia Transfer of Care Note  Patient: [name]  Procedure(s) Performed: Procedure(s) (LRB): TOTAL HIP ARTHROPLASTY (Left)  Patient Location: PACU  Anesthesia Type: General  Level of Consciousness: awake, alert , oriented and patient cooperative  Airway & Oxygen Therapy: Patient Spontanous Breathing and Patient connected to nasal cannula oxygen  Post-op Assessment: Report given to PACU RN, Post -op Vital signs reviewed and stable and Patient moving all extremities  Post vital signs: Reviewed and stable  Complications: No apparent anesthesia complications

## 2011-10-25 NOTE — Op Note (Signed)
NAMEMarland Sanchez  MERELYN, KLUMP NO.:  [id number]  MEDICAL RECORD NO.:  [id number]  LOCATION:  [facility]                         FACILITY:  [facility]  PHYSICIAN:  Claude Manges. Whitfield, M.D.DATE OF BIRTH:  [birth year]  DATE OF PROCEDURE:  10/25/2011 DATE OF DISCHARGE:                              OPERATIVE REPORT   PREOPERATIVE DIAGNOSIS:  End-stage osteoarthritis, left hip.  POSTOPERATIVE DIAGNOSIS:  End-stage osteoarthritis, left hip.  PROCEDURE:  Left total hip replacement.  SURGEON:  Claude Manges. Cleophas Dunker, MD  ASSISTANT:  Oris Drone. Petrarca, PA-C  ANESTHESIA:  General.  COMPLICATIONS:  None.  COMPONENTS:  DePuy AML small stature 12-mm femoral component, a 52-mm outer diameter sector 3 Gription metallic acetabular component, a single 25 mm x 6.5 mm acetabular screw, a +4 marathon polyethylene acetabular liner with apex hole eliminator, a 36 mm outer diameter head with a +5 mm neck length; all components were press-fit.  PROCEDURE:  [name] was met in the holding area, Anesthesia performed insertion of the central line.  I marked the left hip as the appropriate operative site.  The patient was then transported to room #7, placed under general anesthesia without difficulty.  Nursing staff inserted a Foley catheter.  Urine was clear.  The patient was then placed in the lateral decubitus position with the left side up and secured to the operating room table with the Innomed hip system.  The left hip was then prepped with Betadine scrub and then DuraPrep from iliac crest to below the knee.  Sterile draping was performed.  A routine southern incision was utilized via sharp dissection, carried down to subcutaneous tissue.  There was abundant adipose tissue, which was incised with the Bovie.  Care was taken throughout the procedure to avoid blood loss.  Self-retaining retractors were inserted.  The iliotibial band was identified and incised along length of the skin incision.   Short external rotators were identified.  Tendinous structures were tagged with 0-Ethibond suture.  They were then released from the posterior attachment of the greater trochanter.  Capsule was identified and incised along the femoral neck and head.  There was a large clear yellow joint effusion.  This was sent for culture and sensitivity, both anaerobic and aerobic, I also encountered a small greater trochanteric bursa that I excised.  The head was then easily dislocated posteriorly.  There was loss of articular cartilage superiorly in the area of the arthritis.  Using the oscillating saw and the calcar guide the femoral head was osteotomized at a point about a fingerbreadth proximal to the lesser trochanter. Center hole was made in the piriformis fossa.  Canal finders were inserted.  Reaming was performed sequentially to 12 to accept a 12 mm component with nice endosteal purchase.  Rasping was performed sequentially using a 10, 5, and then a 12 small stature femoral component.  Calcar reamer was used to obtain the appropriate calcar angle.  Retractors were then placed around the acetabulum.  Labrum was excised.  Reaming was performed sequentially to 52 mm to accept the 52 mm outer diameter acetabular component.  I trialed a 50 and 52 component, I thought the 52 had  good rim fit, would not completely seat. Accordingly the sector 2 Gription 52 mm outer diameter acetabular component was inserted.  I had nice purchase, it appeared to be perfectly stable.  The trial marathon polyethylene liner was inserted followed by the 12 mm small stature femoral component, and then we used a 36 mm outer diameter hip ball with a +5 mm neck length.  This was reduced.  All components were stable.  I thought leg lengths remained symmetrical.  We could not dislocate the hip with any movement motion and it did not appear to be tight.  The trial components were removed.  The joint was copiously  irrigated with saline solution.  I elected to use a single acetabular screw. Under direct visualization, the central hole was reamed, measured and filled with a 25 mm titanium 6.5 mm screws that had a very nice tight fit.  The wound was again irrigated.  The apex hole eliminator was inserted followed by the marathon polyethylene liner.  The wound was again irrigated.  The 12 mm small stature femoral component was then impacted flush on the calcar.  We cleaned the [facility] taper neck and then inserted the 36 mm outer diameter hip ball with a +5 mm neck length.  We inspected the acetabulum, it was clear.  We then reduced the hip under direct visualization.  Through a full range of motion, we had perfect stability.  There was no tension in extension and I thought the leg lengths were symmetrical.  We were able to palpate the sciatic nerve throughout the procedure and felt that it was well protected.  Wound was again irrigated with saline solution.  The deep capsule was then closed anatomically with #1 Ethibond.  The short external rotators closed with similar material.  The iliotibial band was closed with #1 Vicryl.  Subcu closed in several layers with 0, 2-0 Vicryl and 3-0 Monocryl.  Skin was closed with skin clips.  Sterile bulky dressing was applied followed by a knee immobilizer.  The patient was then placed in the supine position, awoken, then returned to the postanesthesia recovery room without complications.  The patient tolerated the procedure without complications.  We were very careful about blood loss for the patient being anemic and limited incompatibility with blood transfusion as she had warm antibodies.     Claude Manges. Cleophas Dunker, M.D.     PWW/MEDQ  D:  10/25/2011  T:  10/25/2011  Job:  [id number]

## 2011-10-25 NOTE — Anesthesia Postprocedure Evaluation (Signed)
  Anesthesia Post-op Note  Patient: [name]  Procedure(s) Performed: Procedure(s) (LRB): TOTAL HIP ARTHROPLASTY (Left)  Patient Location: PACU  Anesthesia Type: General  Level of Consciousness: awake, alert  and oriented  Airway and Oxygen Therapy: Patient Spontanous Breathing and Patient connected to nasal cannula oxygen  Post-op Pain: mild  Post-op Assessment: Post-op Vital signs reviewed  Post-op Vital Signs: Reviewed  Complications: No apparent anesthesia complications

## 2011-10-25 NOTE — Progress Notes (Signed)
Orthopedic Tech Progress Note Patient Details:  [name] 06-04-1950 [id number] Applied overhead frame and trapeze bar.     Jennye Moccasin 10/25/2011, 7:06 PM

## 2011-10-25 NOTE — Progress Notes (Signed)
UR COMPLETED  

## 2011-10-26 ENCOUNTER — Encounter (HOSPITAL_COMMUNITY): Payer: Self-pay | Admitting: Orthopaedic Surgery

## 2011-10-26 LAB — BASIC METABOLIC PANEL
Calcium: 8 mg/dL — ABNORMAL LOW (ref 8.4–10.5)
Creatinine, Ser: 0.77 mg/dL (ref 0.50–1.10)
GFR calc Af Amer: >90 mL/min (ref >90)
GFR calc non Af Amer: 88 mL/min — ABNORMAL LOW (ref >90)

## 2011-10-26 LAB — CBC
MCH: 34.2 pg — ABNORMAL HIGH (ref 26.0–34.0)
MCHC: 35 g/dL (ref 30.0–36.0)
MCV: 97.8 fL (ref 78.0–100.0)
Platelets: 246 10*3/uL (ref 150–400)
RDW: 14.5 % (ref 11.5–15.5)
WBC: 6.5 10*3/uL (ref 4.0–10.5)

## 2011-10-26 LAB — OSMOLALITY: Osmolality: 265 mOsm/kg — ABNORMAL LOW (ref 275–300)

## 2011-10-26 MED ORDER — HYDROCODONE-ACETAMINOPHEN 5-325 MG PO TABS
1.0000 | ORAL_TABLET | ORAL | Status: DC | PRN
  Administered 2011-10-26 (×2): 2 via ORAL
  Administered 2011-10-26 – 2011-10-27 (×3): 1 via ORAL
  Administered 2011-10-27 (×4): 2 via ORAL
  Administered 2011-10-27: 1 via ORAL
  Administered 2011-10-28 (×3): 2 via ORAL
  Filled 2011-10-26 (×7): qty 2
  Filled 2011-10-26: qty 1
  Filled 2011-10-26 (×5): qty 2

## 2011-10-26 MED ORDER — METOCLOPRAMIDE HCL 5 MG PO TABS
5.0000 mg | ORAL_TABLET | Freq: Three times a day (TID) | ORAL | Status: DC
  Administered 2011-10-26 – 2011-10-27 (×4): 5 mg via ORAL
  Filled 2011-10-26 (×6): qty 1

## 2011-10-26 NOTE — Progress Notes (Signed)
Occupational Therapy Evaluation Patient Details Name: [name] MRN: [id number] DOB: 1951-03-19 Today's Date: 10/26/2011 Time: 4098-1191 OT Time Calculation (min): 23 min  OT Assessment / Plan / Recommendation Clinical Impression  Pt s/p L posterior THA thus affecting PLOF. Will benefit from acute OT services to address below problem list in prep for d/c home with 24/7 assist from family.    OT Assessment  Patient needs continued OT Services    Follow Up Recommendations  Home health OT;Supervision/Assistance - 24 hour    Barriers to Discharge      Equipment Recommendations  3 in 1 bedside comode    Recommendations for Other Services    Frequency  Min 2X/week    Precautions / Restrictions Precautions Precautions: Posterior Hip Precaution Booklet Issued: Yes (comment) Precaution Comments: Reviewed 3/3 posteior hip precautions. Restrictions Weight Bearing Restrictions: Yes LLE Weight Bearing: Weight bearing as tolerated   Pertinent Vitals/Pain See vitals    ADL  Lower Body Bathing: Simulated;+1 Total assistance Where Assessed - Lower Body Bathing: Supported sit to stand Upper Body Dressing: Performed;Set up Where Assessed - Upper Body Dressing: Unsupported sitting Lower Body Dressing: Performed;+1 Total assistance Where Assessed - Lower Body Dressing: Supported sit to stand Toilet Transfer: Performed;+2 Total assistance Toilet Transfer: Patient Percentage: 70% Statistician Method: Surveyor, minerals: Materials engineer and Hygiene: Performed;Minimal assistance (pulling gown up over hips) Where Assessed - Toileting Clothing Manipulation and Hygiene: Sit to stand from 3-in-1 or toilet Equipment Used: Gait belt;Rolling walker Transfers/Ambulation Related to ADLs: +2 assist for safety due to dizziness ADL Comments: Pt became dizzy while on 3n1 and safely returned to bed from 3n1.    OT Diagnosis: Generalized  weakness;Acute pain  OT Problem List: Decreased strength;Decreased activity tolerance;Decreased knowledge of use of DME or AE;Decreased knowledge of precautions;Pain OT Treatment Interventions: Self-care/ADL training;DME and/or AE instruction;Therapeutic activities;Patient/family education   OT Goals Acute Rehab OT Goals OT Goal Formulation: With patient Time For Goal Achievement: 11/02/11 Potential to Achieve Goals: Good ADL Goals Pt Will Perform Grooming: with supervision;Standing at sink ADL Goal: Grooming - Progress: Goal set today Pt Will Perform Lower Body Bathing: with supervision;Sit to stand from bed;Sit to stand from chair;with adaptive equipment ADL Goal: Lower Body Bathing - Progress: Goal set today Pt Will Perform Lower Body Dressing: with supervision;with adaptive equipment;Sit to stand from chair;Sit to stand from bed ADL Goal: Lower Body Dressing - Progress: Goal set today Pt Will Transfer to Toilet: with supervision;Comfort height toilet;with DME;Ambulation;Maintaining hip precautions ADL Goal: Toilet Transfer - Progress: Goal set today Pt Will Perform Toileting - Clothing Manipulation: with supervision;Standing ADL Goal: Toileting - Clothing Manipulation - Progress: Goal set today Pt Will Perform Toileting - Hygiene: with supervision;Sit to stand from 3-in-1/toilet ADL Goal: Toileting - Hygiene - Progress: Goal set today Pt Will Perform Tub/Shower Transfer: Shower transfer;with supervision;Ambulation;with DME;Shower seat with back;Maintaining hip precautions ADL Goal: Tub/Shower Transfer - Progress: Goal set today Additional ADL Goal #1: Pt will independently verbalize and generalize 3/3 post. hip precautions during all ADL activities. ADL Goal: Additional Goal #1 - Progress: Goal set today Miscellaneous OT Goals Miscellaneous OT Goal #1: Pt will perform bed mobility with supervision as precursor for EOB ADLs. OT Goal: Miscellaneous Goal #1 - Progress: Goal set  today  Visit Information  Last OT Received On: 10/26/11 Assistance Needed: +2 (safety due to dizziness)    Subjective Data      Prior Functioning  Home Living Lives With:  Daughter Available Help at Discharge: Family;Friend(s);Available 24 hours/day Type of Home: House Home Access: Stairs to enter [employer] of Steps: 3 Entrance Stairs-Rails: Left Home Layout: One level Bathroom Shower/Tub: Health visitor: Handicapped height Bathroom Accessibility: Yes How Accessible: Accessible via walker Home Adaptive Equipment: None Prior Function Level of Independence: Independent Able to Take Stairs?: Yes Driving: Yes 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 Communication Communication: No difficulties    Cognition  Overall Cognitive Status: Appears within functional limits for tasks assessed/performed Arousal/Alertness: Awake/alert Orientation Level: Appears intact for tasks assessed Behavior During Session: [facility] for tasks performed    Extremity/Trunk Assessment Right Upper Extremity Assessment RUE ROM/Strength/Tone: Within functional levels Left Upper Extremity Assessment LUE ROM/Strength/Tone: Within functional levels   Mobility Bed Mobility Bed Mobility: Supine to Sit;Sit to Supine Supine to Sit: 4: Min assist Sit to Supine: 3: Mod assist Details for Bed Mobility Assistance: (A) to elevate LE OOB and (A) LE into bed with max cues for proper technique and prevent left LE internal rotation.   Transfers Sit to Stand: 1: +2 Total assist;From bed;With armrests;From chair/3-in-1 Sit to Stand: Patient Percentage: 70% Stand to Sit: 1: +2 Total assist;With armrests;To chair/3-in-1;To bed Stand to Sit: Patient Percentage: 70% Details for Transfer Assistance: +2 (A) for safety with max cues for hand palcement and proper foot placement.  Pt limited and need +2 (A) due to dizziness and flush like symptoms.  Pt was immediately return to  supine from 3n1.   Exercise Total Joint Exercises Ankle Circles/Pumps: PROM;10 reps;Supine Quad Sets: AROM;Left;10 reps;Supine Short Arc Quad: 10 reps;Supine;Strengthening Heel Slides: AAROM;10 reps;Supine Hip ABduction/ADduction: Strengthening;10 reps;Supine  Balance Balance Balance Assessed: Yes Static Sitting Balance Static Sitting - Balance Support: Feet supported Static Sitting - Level of Assistance: 5: Stand by assistance  End of Session OT - End of Session Equipment Utilized During Treatment: Gait belt Activity Tolerance: Other (comment) (limited by dizziness) Patient left: in bed;with call bell/phone within reach Nurse Communication: Mobility status  GO    10/26/2011 Cipriano Mile OTR/L Pager [phone] Office [phone]  Cipriano Mile 10/26/2011, 5:13 PM

## 2011-10-26 NOTE — Progress Notes (Signed)
Patient ID: [name], female   DOB: 14-Jan-1951, 61 y.o.   MRN: [id number] PATIENT ID: Kari Collum Heslin        MRN:  [id number]          DOB/AGE: 11/08/1950 / 61 y.o.  Norlene Campbell, MD   Jacqualine Code, PA-C [address] [place], [place], [place]  [address]                             [phone]   PROGRESS NOTE  Subjective:  negative for Chest Pain  negative for Shortness of Breath  positive for Nausea/Vomiting - nausea with oxy IR  negative for Calf Pain  negative for Bowel Movement   Tolerating Diet: yes         Patient reports pain as mild.       Objective: Vital signs in last 24 hours:   Patient Vitals for the past 24 hrs:  BP Temp Temp src Pulse Resp SpO2 Height Weight  10/26/11 0620 123/68 mmHg 98.8 F (37.1 C) Oral 81  16  91 % - -  10/26/11 0406 - 98.7 F (37.1 C) Oral - - - - -  10/26/11 0109 146/82 mmHg 99.9 F (37.7 C) Oral 82  16  93 % - -  10/25/11 2026 140/77 mmHg 98.7 F (37.1 C) Oral 85  18  98 % - -  10/25/11 1600 - - - - 16  100 % - -  10/25/11 1448 133/83 mmHg 97.5 F (36.4 C) Oral 64  16  98 % - -  10/25/11 1424 130/60 mmHg - - 61  17  100 % - -  10/25/11 1409 114/70 mmHg - - 61  17  99 % 5\' 4"  (1.626 m) 82.101 kg (181 lb)  10/25/11 1400 108/66 mmHg - - - - - - -  10/25/11 1345 97/58 mmHg - - - - - - -  10/25/11 1327 98/57 mmHg 97.8 F (36.6 C) - - - 97 % - -  10/25/11 0745 137/92 mmHg 98.8 F (37.1 C) Oral 50  18  - - -      Intake/Output from previous day:   07/09 0701 - 07/10 0700 In: 3180 [P.O.:480; I.V.:2700] Out: 1275 [Urine:1075]   Intake/Output this shift:       Intake/Output      07/09 0701 - 07/10 0700 07/10 0701 - 07/11 0700   P.O. 480    I.V. (mL/kg) 2700 (32.9)    Total Intake(mL/kg) 3180 (38.7)    Urine (mL/kg/hr) 1075 (0.5)    Blood 200    Total Output 1275    Net +1905            LABORATORY DATA:  Basename 10/26/11 0605  WBC 6.5  HGB 7.8*  HCT 22.3*  PLT 246    Basename 10/24/11 1303  NA 130*  K 4.6    CL 98  CO2 21  BUN 15  CREATININE 0.77  GLUCOSE 104*  CALCIUM 9.4   Lab Results  Component Value Date   INR 1.12 10/14/2011   INR 0.94 07/05/2011   INR 1.16 05/10/2011    Examination:  General appearance: alert and no distress  Wound Exam: clean, dry, intact   Drainage:  Scant/small amount Serosanguinous exudate  Motor Exam: EHL, FHL, Anterior Tibial and Posterior Tibial Intact  Sensory Exam: Superficial Peroneal, Deep Peroneal and Tibial normal  Vascular Exam: Normal  Assessment:  1 Day Post-Op  Procedure(s) (LRB): TOTAL HIP ARTHROPLASTY (Left)  ADDITIONAL DIAGNOSIS:  Active Problems:  * No active hospital problems. *   Acute Blood Loss Anemia-will monitor, asymptomatic   Plan: Physical Therapy as ordered Weight Bearing as Tolerated (WBAT)  DVT Prophylaxis:  Xarelto  DISCHARGE PLAN: Home  DISCHARGE NEEDS: HHPT, Walker and 3-in-1 comode seat    OOB with PT, monitor labs,switch to hydrocodone     Lahoma Constantin W 10/26/2011, 7:17 AM

## 2011-10-26 NOTE — Progress Notes (Addendum)
Pharmacy note: Vancomycin clarification for Core measures  Patient to receive 48hr duration on vancomycin post-op for high risk MRSA (history or MRSA infection in a prior knee surgery)  Thank you, Harland German, Pharm D 10/26/2011 8:30 AM

## 2011-10-26 NOTE — Progress Notes (Signed)
PT Progress NOTE  Past Medical History  Diagnosis Date  . Hypertension     She has a past hx of essential  . 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 Inflammation of joint of knee     Since we last last saw her she developed problems with an acute which required surgical drainage by her orthopedist Dr. Cleophas Dunker.  Marland Kitchen MRSA (methicillin resistant Staphylococcus aureus)     Knee surgery drainage was positive for MRSA and she was treated with 3 weeks of doxycycline successfully.  . Diarrhea     Mild  . Exogenous obesity   . Atrial flutter     during hospitalization, 04/2011- related to anemia & illness/stress   . Depression   . GERD (gastroesophageal reflux disease)     2 hosp.- ischemic colitis - residual of Norovirus, 05/2011- sm. bowel obstruction  . Headache     migraine headache on occas, less now than when she was younger   . Arthritis     L hip, back, neck   . History of blood transfusion     04/2011- /w ischemic colitis   . Anemia     will see hematology consult prior to surgery, recommended by Dr. Patty Sermons  . Pneumonia     04/2011- not hospitalized , pt. denies SOB, changes in chest, breathing   Past Surgical History  Procedure Date  . Knee surgery     I&D- 2008, post laceration   . Colonoscopy 05/16/2011    Procedure: COLONOSCOPY;  Surgeon: Vertell Novak., MD;  Location: [facility];  Service: Endoscopy;  Laterality: N/A;  . Appendectomy 1962  . Abdominal hysterectomy 1988  . Small intestine surgery 1992, 1999  . Laparotomy and lysis of adhesions     10/26/11 1500  PT Visit Information  Last PT Received On 10/26/11  Assistance Needed +2  PT Time Calculation  PT Start Time 1440  PT Stop Time 1500  PT Time Calculation (min) 20 min  Precautions  Precautions Posterior Hip  Precaution Booklet Issued Yes (comment)  Precaution Comments Reviewed 3/3 posteior hip precautions.  Restrictions    Weight Bearing Restrictions Yes  LLE Weight Bearing WBAT  Cognition  Overall Cognitive Status Appears within functional limits for tasks assessed/performed  Arousal/Alertness Awake/alert  Orientation Level Appears intact for tasks assessed  Behavior During Session [place] for tasks performed  Exercises  Exercises Total Joint  Total Joint Exercises  Ankle Circles/Pumps PROM;10 reps;Supine  Quad Sets AROM;Left;10 reps;Supine  Short Arc Quad 10 reps;Supine;Strengthening  Heel Slides AAROM;10 reps;Supine  Hip ABduction/ADduction Strengthening;10 reps;Supine  PT - End of Session  Activity Tolerance Patient tolerated treatment well  Patient left in bed;with call bell/phone within reach;with family/visitor present  Nurse Communication Mobility status  PT - Assessment/Plan  Comments on Treatment Session Educated pt on HEP and reviewed 3/3 hip precautions.  Pt continues to c/o overall fatigue and agreed to attempt OOB activity tomorrow (10/27/11).  PT Plan Discharge plan remains appropriate;Frequency remains appropriate  PT Frequency 7X/week  Follow Up Recommendations Home health PT;Supervision/Assistance - 24 hour  Equipment Recommended Rolling walker with 5" wheels  Acute Rehab PT Goals  PT Goal Formulation With patient/family  Time For Goal Achievement 11/02/11  Potential to Achieve Goals Good  Pt will Perform Home Exercise Program Independently  PT Goal: Perform Home Exercise Program - Progress Goal set today  PT General Charges  $$  ACUTE PT VISIT 1 Procedure  PT Treatments  $Therapeutic Exercise 8-22 mins     [place], [place] DPT [phone]

## 2011-10-26 NOTE — Progress Notes (Signed)
CARE MANAGEMENT NOTE 10/26/2011  Patient:  [name], [name]   Account Number:  [id number]  Date Initiated:  10/26/2011  Documentation initiated by:  Vance Peper  Subjective/Objective Assessment:   61 yr old female s/p left total hip arthroplasty.     Action/Plan:   Case Manager spoke with patient and family. Choice offered. Preoperatively setup with [facility], no changes.Has rolling walker, 3in1 to be delivered. Has family support at discharge.   Anticipated DC Date:  10/28/2011   Anticipated DC Plan:  HOME W HOME HEALTH SERVICES      DC Planning Services  CM consult      [facility] Choice  HOME HEALTH   Choice offered to / List presented to:  C-1 Patient        HH arranged  HH-2 PT      [facility] agency  [facility]   Status of service:  Completed, signed off Medicare Important Message given?   (If response is "NO", the following Medicare IM given date fields will be blank) Date Medicare IM given:   Date Additional Medicare IM given:    Discharge Disposition:  HOME W HOME HEALTH SERVICES

## 2011-10-26 NOTE — Progress Notes (Signed)
CARE MANAGEMENT NOTE 10/26/2011  Comments:  10/26/11 1620 Vance Peper, RN BSN Case Manager Patient will be discharged on Xarelto 10 mg., contacted patient's pharmacy-[facility] on [address] [phone]. Spoke with pharmacist Algernon Huxley, they only have 3 or 4 tabs in stock, will order Xarelto 10mg , it should be in pharmacy around 3p on 10/27/11.

## 2011-10-26 NOTE — Evaluation (Signed)
Physical Therapy Evaluation Patient Details Name: [name] MRN: [id number] DOB: Feb 19, 1951 Today's Date: 10/26/2011 Time: 2130-8657 PT Time Calculation (min): 23 min  PT Assessment / Plan / Recommendation Clinical Impression  Pt is 61 y/o female admitted for s/p left posterior THA.  Limited evaluation due to overall nausea and increase dizziness when sitting upright.  Pt was return to supine where symptoms subsided.  Pt will benefit from acute PT services to improve overall mobility and prepare for safe d/c home with family.    PT Assessment  Patient needs continued PT services    Follow Up Recommendations  Home health PT;Supervision/Assistance - 24 hour    Barriers to Discharge        Equipment Recommendations  Rolling walker with 5" wheels    Recommendations for Other Services     Frequency 7X/week    Precautions / Restrictions Precautions Precautions: Posterior Hip Precaution Booklet Issued: Yes (comment) Precaution Comments: Reviewed 3/3 posteior hip precautions. Restrictions Weight Bearing Restrictions: Yes LLE Weight Bearing: Weight bearing as tolerated   Pertinent Vitals/Pain 9/10 left hip pain; pt premedicated       Mobility  Bed Mobility Bed Mobility: Supine to Sit;Sit to Supine Supine to Sit: 4: Min assist Sit to Supine: 3: Mod assist Details for Bed Mobility Assistance: (A) to elevate LE OOB and (A) LE into bed with max cues for proper technique and prevent left LE internal rotation.   Transfers Transfers: Sit to Stand;Stand to Sit;Stand Pivot Transfers Sit to Stand: 1: +2 Total assist;From bed;With armrests;From chair/3-in-1 Sit to Stand: Patient Percentage: 70% Stand to Sit: 1: +2 Total assist;With armrests;To chair/3-in-1;To bed Stand to Sit: Patient Percentage: 70% Stand Pivot Transfers: 1: +2 Total assist;With armrests Stand Pivot Transfers: Patient Percentage: 70% Details for Transfer Assistance: +2 (A) for safety with max cues for hand  palcement and proper foot placement.  Pt limited and need +2 (A) due to dizziness and flush like symptoms.  Pt was immediately return to supine from 3n1.    Exercises     PT Diagnosis: Difficulty walking;Abnormality of gait;Generalized weakness;Acute pain  PT Problem List: Decreased strength;Decreased activity tolerance;Decreased balance;Decreased mobility;Decreased knowledge of use of DME;Pain PT Treatment Interventions: DME instruction;Functional mobility training;Therapeutic activities;Therapeutic exercise;Balance training;Patient/family education;Gait training;Stair training   PT Goals Acute Rehab PT Goals PT Goal Formulation: With patient/family Time For Goal Achievement: 11/02/11 Potential to Achieve Goals: Good Pt will go Supine/Side to Sit: with modified independence PT Goal: Supine/Side to Sit - Progress: Goal set today Pt will go Sit to Supine/Side: with modified independence PT Goal: Sit to Supine/Side - Progress: Goal set today Pt will go Sit to Stand: with modified independence PT Goal: Sit to Stand - Progress: Goal set today Pt will go Stand to Sit: with modified independence PT Goal: Stand to Sit - Progress: Goal set today Pt will Transfer Bed to Chair/Chair to Bed: with modified independence PT Transfer Goal: Bed to Chair/Chair to Bed - Progress: Goal set today Pt will Ambulate: >150 feet;with modified independence;with rolling walker PT Goal: Ambulate - Progress: Goal set today Pt will Go Up / Down Stairs: 3-5 stairs;with min assist;with rolling walker PT Goal: Up/Down Stairs - Progress: Goal set today  Visit Information  Last PT Received On: 10/26/11 Assistance Needed: +2 (safety due to dizziness) PT/OT Co-Evaluation/Treatment: Yes    Subjective Data  Subjective: "I thought I was going to pop right up." Patient Stated Goal: To eventually go home and return to work   Prior [employer]  Home Living Lives With: Daughter Available Help at Discharge:  Family;Friend(s);Available 24 hours/day Type of Home: House Home Access: Stairs to enter [employer] of Steps: 3 Entrance Stairs-Rails: Left Home Layout: One level Bathroom Shower/Tub: Health visitor: Handicapped height Bathroom Accessibility: Yes How Accessible: Accessible via walker Prior Function Level of Independence: Independent Able to Take Stairs?: Yes Driving: Yes 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 Communication Communication: No difficulties    Cognition  Overall Cognitive Status: Appears within functional limits for tasks assessed/performed Arousal/Alertness: Awake/alert Orientation Level: Appears intact for tasks assessed Behavior During Session: [facility] for tasks performed    Extremity/Trunk Assessment Right Lower Extremity Assessment RLE ROM/Strength/Tone: Within functional levels Left Lower Extremity Assessment LLE ROM/Strength/Tone: Unable to fully assess;Due to precautions;Due to pain   Balance Balance Balance Assessed: Yes Static Sitting Balance Static Sitting - Balance Support: Feet supported Static Sitting - Level of Assistance: 5: Stand by assistance  End of Session PT - End of Session Equipment Utilized During Treatment: Gait belt Activity Tolerance: Treatment limited secondary to medical complications (Comment) Patient left: in bed;with call bell/phone within reach;with family/visitor present Nurse Communication: Mobility status  GP     Bahja Bence 10/26/2011, 1:55 PM [place], PT DPT [phone]

## 2011-10-26 NOTE — Progress Notes (Signed)
Referral received for SNF. Chart reviewed and CSW has spoken with RNCM who indicates that patient is for DC to home with Home Health and DME.  CSW to sign off. Please re-consult if CSW needs arise.  Latonda Larrivee T. Bartt Gonzaga, LCSWA  [phone]  

## 2011-10-27 DIAGNOSIS — D62 Acute posthemorrhagic anemia: Secondary | ICD-10-CM | POA: Diagnosis not present

## 2011-10-27 DIAGNOSIS — E871 Hypo-osmolality and hyponatremia: Secondary | ICD-10-CM | POA: Diagnosis present

## 2011-10-27 DIAGNOSIS — K567 Ileus, unspecified: Secondary | ICD-10-CM | POA: Diagnosis not present

## 2011-10-27 DIAGNOSIS — M169 Osteoarthritis of hip, unspecified: Secondary | ICD-10-CM | POA: Diagnosis present

## 2011-10-27 LAB — BASIC METABOLIC PANEL
Calcium: 7.6 mg/dL — ABNORMAL LOW (ref 8.4–10.5)
GFR calc Af Amer: >90 mL/min (ref >90)
GFR calc non Af Amer: >90 mL/min (ref >90)
Potassium: 3.7 mEq/L (ref 3.5–5.1)
Sodium: 126 mEq/L — ABNORMAL LOW (ref 135–145)

## 2011-10-27 LAB — CBC
MCHC: 35.9 g/dL (ref 30.0–36.0)
RDW: 14.4 % (ref 11.5–15.5)

## 2011-10-27 MED ORDER — DOXYCYCLINE HYCLATE 100 MG PO TABS
100.0000 mg | ORAL_TABLET | Freq: Two times a day (BID) | ORAL | Status: DC
  Administered 2011-10-27 – 2011-10-28 (×2): 100 mg via ORAL
  Filled 2011-10-27 (×4): qty 1

## 2011-10-27 MED ORDER — SODIUM CHLORIDE 0.9 % IJ SOLN
10.0000 mL | INTRAMUSCULAR | Status: DC | PRN
  Administered 2011-10-27: 10 mL

## 2011-10-27 MED ORDER — DIPHENHYDRAMINE HCL 12.5 MG/5ML PO ELIX
25.0000 mg | ORAL_SOLUTION | Freq: Once | ORAL | Status: AC
  Administered 2011-10-27: 25 mg via ORAL
  Filled 2011-10-27: qty 10

## 2011-10-27 MED ORDER — RIVAROXABAN 10 MG PO TABS: 10.0000 mg | ORAL_TABLET | ORAL | Status: DC

## 2011-10-27 MED ORDER — DOXYCYCLINE HYCLATE 100 MG PO TABS: 100.0000 mg | ORAL_TABLET | Freq: Two times a day (BID) | ORAL | Status: AC

## 2011-10-27 MED ORDER — ACETAMINOPHEN 325 MG PO TABS
650.0000 mg | ORAL_TABLET | Freq: Once | ORAL | Status: AC
  Administered 2011-10-27: 650 mg via ORAL
  Filled 2011-10-27: qty 2

## 2011-10-27 MED ORDER — HYDROCODONE-ACETAMINOPHEN 5-325 MG PO TABS: 1.0000 | ORAL_TABLET | ORAL | Status: AC | PRN

## 2011-10-27 MED ORDER — DIPHENHYDRAMINE HCL 25 MG PO CAPS
12.5000 mg | ORAL_CAPSULE | Freq: Once | ORAL | Status: DC
Start: 2011-10-27 — End: 2011-10-27

## 2011-10-27 NOTE — Progress Notes (Signed)
PT Cancellation Note  Treatment cancelled today due to medical issues with patient which prohibited therapy.  Hgb 6.6.  Spoke with RN who reports orders are for pt to receive blood transfusion.  Will check back later today.    Verdell Face, [place] [phone] 10/27/2011

## 2011-10-27 NOTE — Progress Notes (Addendum)
Patient ID: [name], female   DOB: [birth year], 61 y.o.   MRN: [id number] PATIENT ID: [name]        MRN:  [id number]          DOB/AGE: [name], [name] / 61 y.o.  Norlene Campbell, MD   Jacqualine Code, PA-C [address] [place], [place], [place]  [address]                             [phone]   PROGRESS NOTE  Subjective:  negative for Chest Pain  negative for Shortness of Breath  negative for Nausea/Vomiting   negative for Calf Pain  negative for Bowel Movement   Tolerating Diet: yes         Patient reports pain as mild.     Much better today.  No nausea.  Pain controlled with hydrocodone.    Objective: Vital signs in last 24 hours:   Patient Vitals for the past 24 hrs:  BP Temp Pulse Resp SpO2  07/11/Sanchez 1020 115/66 mmHg 99.2 F (37.3 C) 89  16  -  07/11/Sanchez 1004 86/59 mmHg 98.6 F (37 C) 95  16  -  07/11/Sanchez 0451 120/99 mmHg 98.1 F (36.7 C) 84  16  91 %  07/11/Sanchez 0400 - - - 18  91 %  07/11/Sanchez 0000 - - - 16  91 %  07/10/Sanchez 2237 146/72 mmHg 100.8 F (38.2 C) 72  16  95 %  07/10/Sanchez 2000 - - - 16  95 %  07/10/Sanchez 1600 - - - 16  90 %  07/10/Sanchez 1400 136/54 mmHg 100.5 F (38.1 C) 104  16  -  07/10/Sanchez 1200 - - - 16  92 %      Intake/Output from previous day:   07/10 0701 - 07/11 0700 In: 480 [P.O.:480] Out: -    Intake/Output this shift:   07/11 0701 - 07/11 1900 In: 135 [P.O.:120] Out: -    Intake/Output      07/10 0701 - 07/11 0700 07/11 0701 - 07/12 0700   P.O. 480 120   I.V. (mL/kg)     Blood  15   Total Intake(mL/kg) 480 (5.8) 135 (1.6)   Urine (mL/kg/hr)     Blood     Total Output     Net +480 +135        Urine Occurrence 3 x 4 x      LABORATORY DATA:  Basename 07/11/Sanchez 0640 07/10/Sanchez 0605  WBC 11.6* 6.5  HGB 6.6* 7.8*  HCT 18.4* 22.3*  PLT 212 246    Basename 07/11/Sanchez 0640 07/10/Sanchez 0605 07/08/Sanchez 1303  NA 126* 126* 130*  K 3.7 4.1 4.6  CL 93* 93* 98  CO2 20 21 21   BUN 15 17 15   CREATININE 0.66 0.77 0.77  GLUCOSE 107* 112* 104*    CALCIUM 7.6* 8.0* 9.4   Lab Results  Component Value Date   INR 1.12 10/14/2011   INR 0.94 07/05/2011   INR 1.16 05/10/2011    Examination:  General appearance: alert, cooperative and mild distress Resp: clear to auscultation bilaterally Cardio: regularly rhythm and rate GI: normal findings: bowel sounds normal  Wound Exam: clean, dry, intact   Drainage:  Scant/small amount Bloody exudate  Motor Exam: EHL, FHL, Anterior Tibial and Posterior Tibial Intact  Sensory Exam: Superficial Peroneal, Deep Peroneal and Tibial normal  Vascular Exam: Right dorsalis pedis artery has 1+ (weak) pulse and  Left dorsalis pedis artery has 1+ (weak) pulse  Assessment:    2 Days Post-Op  Procedure(s) (LRB): TOTAL HIP ARTHROPLASTY (Left)  ADDITIONAL DIAGNOSIS:  Active Problems:  * No active hospital problems. *   Acute Blood Loss Anemia and Hyponatremia   Plan: Physical Therapy as ordered Weight Bearing as Tolerated (WBAT)  DVT Prophylaxis:  Xarelto, Foot Pumps and TED hose  DISCHARGE PLAN: Home  DISCHARGE NEEDS: HHPT, Walker and 3-in-1 comode seat  Giving 2 units PRBC's today.  BP down and hgb 6.5.  Feeling better already with blood.  D/C reglan  Dressing changed today.         Domanique Luckett 10/27/2011, 11:44 AM

## 2011-10-27 NOTE — Discharge Summary (Signed)
Norlene Campbell, MD   Jacqualine Code, PA-C [address] [place], [place], [place]  [address]                             [phone]  PATIENT ID: [name]        MRN:  [id number]          DOB/AGE: 18-Nov-1950 / 61 y.o.    DISCHARGE SUMMARY  ADMISSION DATE:    10/25/2011 DISCHARGE DATE:   10/28/2011   ADMISSION DIAGNOSIS: osteoarrthritis left hip    DISCHARGE DIAGNOSIS:  osteoarrthritis left hip    ADDITIONAL DIAGNOSIS: Principal Problem:  *Osteoarthritis of hip Active Problems:  Postoperative anemia due to acute blood loss  Hyponatremia  WBC disease  Ileus  Past Medical History  Diagnosis Date  . Hypertension     She has a past hx of essential  . 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 Inflammation of joint of knee     Since we last last saw her she developed problems with an acute which required surgical drainage by her orthopedist Dr. Cleophas Dunker.  Marland Kitchen MRSA (methicillin resistant Staphylococcus aureus)     Knee surgery drainage was positive for MRSA and she was treated with 3 weeks of doxycycline successfully.  . Diarrhea     Mild  . Exogenous obesity   . Atrial flutter     during hospitalization, 04/2011- related to anemia & illness/stress   . Depression   . GERD (gastroesophageal reflux disease)     2 hosp.- ischemic colitis - residual of Norovirus, 05/2011- sm. bowel obstruction  . Headache     migraine headache on occas, less now than when she was younger   . Arthritis     L hip, back, neck   . History of blood transfusion     04/2011- /w ischemic colitis   . Anemia     will see hematology consult prior to surgery, recommended by Dr. Patty Sermons  . Pneumonia     04/2011- not hospitalized , pt. denies SOB, changes in chest, breathing    PROCEDURE: Procedure(s): LEFT TOTAL HIP ARTHROPLASTY on 10/25/2011  CONSULTS:  NONE   HISTORY: [name] is a very pleasant 61 year old female who has  been followed by our practice for left hip pain. She has had multiple cortisone injections to the hip over the last several months which have had some benefit to her. However, more recently her pain is worsening. Her last injection was in March of 2013 which did not really have much of a benefit at all. She is now having this constant, severe, throbbing, sharp pain with loss of motion in the hip. It is worsening. It is now starting to wake her at nighttime. Activity and exercise worsens her symptoms. Rest, Celebrex, and Lortab have been helpful. She is now to the point where she uses a Lortab at times just to be able to function. She is being seen today for evaluation.   HOSPITAL COURSE:  Kari Collum Joiner is a 61 y.o. admitted on 10/25/2011 and found to have a diagnosis of osteoarrthritis left hip.  After appropriate laboratory studies were obtained  they were taken to the operating room on 10/25/2011 and underwent Procedure(s): LEFT TOTAL HIP ARTHROPLASTY.   They were given perioperative antibiotics:  Anti-infectives     Start  Dose/Rate Route Frequency Ordered Stop   October 28, 2011 1300   doxycycline (VIBRA-TABS) tablet 100 mg        100 mg Oral Every 12 hours 10-28-11 1156     October 28, 2011 0000   doxycycline (VIBRA-TABS) 100 MG tablet        100 mg Oral Every 12 hours 28-Oct-2011 1203 11/06/11 2359   10/25/11 2200   vancomycin (VANCOCIN) IVPB 1000 mg/200 mL premix        1,000 mg 200 mL/hr over 60 Minutes Intravenous Every 12 hours 10/25/11 1609 Oct 28, 2011 1345   10/24/11 1504   ceFAZolin (ANCEF) IVPB 2 g/50 mL premix  Status:  Discontinued        2 g 100 mL/hr over 30 Minutes Intravenous 60 min pre-op 10/24/11 1504 10/25/11 1526        .  Tolerated the procedure well.  Placed with a foley intraoperatively.  Given Ofirmev at induction and for 24 hours.    POD #1, allowed out of bed to a chair.  PT for ambulation and exercise program.  Foley D/C'd in morning.  IV saline locked.  O2 discontionued.  Was  very nauseated and was noted to have scant bowel sounds.  Started on reglan for 24 hours.  Improved with reglan and d/c of oxycodone and beginning of norco.  POD #2, continued PT and ambulation.  BP decreased early in the morning.  Hgb was 6.6 and she was given 2 units of PRBC's   POD #3, continued PT and ambulation. Did well with ambulation.  Requested to be discharged home.  The remainder of the hospital course was dedicated to ambulation and strengthening.   The patient was discharged on 3 Days Post-Op in  Stable condition.  Blood products given:2 units PRBC  DIAGNOSTIC STUDIES: Recent vital signs:  Patient Vitals for the past 24 hrs:  BP Temp Pulse Resp SpO2  10/28/11 0549 139/81 mmHg 98.3 F (36.8 C) 95  18  100 %  10/28/11 0400 - - - 16  93 %  10/28/11 0000 - - - 18  94 %  2011/10/28 2007 128/74 mmHg 98.9 F (37.2 C) 99  18  -  October 28, 2011 1805 134/71 mmHg 98.6 F (37 C) 93  18  -  2011/10/28 1715 126/66 mmHg 98.2 F (36.8 C) 91  18  -  10-28-2011 1611 119/61 mmHg 97.6 F (36.4 C) 91  18  -  10/28/2011 1515 112/63 mmHg 98 F (36.7 C) 91  16  -  2011/10/28 1500 119/64 mmHg 98.3 F (36.8 C) 77  16  -  10-28-2011 1220 109/62 mmHg 98.4 F (36.9 C) 63  18  -  2011-10-28 1200 - - - 16  91 %  10/28/11 1120 121/67 mmHg 98.4 F (36.9 C) 95  18  -  10-28-11 1020 115/66 mmHg 99.2 F (37.3 C) 89  16  -  Oct 28, 2011 1004 86/59 mmHg 98.6 F (37 C) 95  16  -       Recent laboratory studies:  Basename 10/28/11 0540 October 28, 2011 0640 10/26/11 0605  WBC 8.5 11.6* 6.5  HGB 8.4* 6.6* 7.8*  HCT 22.9* 18.4* 22.3*  PLT 198 212 246    Basename 10/28/11 0540 10-28-11 0640 10/26/11 0605 10/24/11 1303  NA 127* 126* 126* 130*  K 3.6 3.7 4.1 4.6  CL 93* 93* 93* 98  CO2 21 20 21 21   BUN 11 15 17 15   CREATININE 0.58 0.66 0.77 0.77  GLUCOSE 91 107* 112* 104*  CALCIUM 7.5* 7.6* 8.0* 9.4   Lab Results  Component Value Date   INR 1.12 10/14/2011   INR 0.94 07/05/2011   INR 1.16 05/10/2011     Recent  Radiographic Studies :  Dg Pelvis Portable  10/25/2011  *RADIOLOGY REPORT*  Clinical Data: Status post left hip replacement.  PORTABLE PELVIS  Comparison: Previous examinations, including the CT dated 07/05/2011.  Findings: Interval left total hip prosthesis in satisfactory position and alignment.  The distal portion of the femoral component is not included.  This will be included on a dedicated left hip examination being obtained at the same time.  No fracture or dislocation is seen.  Moderate inferior right hip joint spur formation.  Surgical staples in the inferior pelvis, centrally.  IMPRESSION:  1.  Satisfactory postoperative appearance of a left total hip prosthesis. 2.  Right hip degenerative change.  Original Report Authenticated By: Darrol Angel, M.D.   Dg Chest Port 1 View  10/25/2011  *RADIOLOGY REPORT*  Clinical Data: Status post central line placement.  PORTABLE CHEST - 1 VIEW  Comparison: 05/10/2011.  Findings: Interval right jugular catheter with its tip in the inferior aspect of the superior vena cava near the cavoatrial junction.  Interval patchy opacity at the right lung base.  Minimal ill-defined increased density at the left lung base.  The cardiac silhouette remains near the upper limit of normal in size.  No pneumothorax is seen.  Mild scoliosis.  IMPRESSION:  1.  Right jugular catheter tip in the inferior aspect of the superior vena cava, without pneumothorax. 2.  Interval patchy opacity at the right lung base, suspicious for pneumonia.  Patchy atelectasis is also a possibility. 3.  Minimal left basilar atelectasis.  Original Report Authenticated By: Darrol Angel, M.D.   Dg Hip Portable 1 View Left  10/25/2011  *RADIOLOGY REPORT*  Clinical Data: Status post left hip replacement.  PORTABLE LEFT HIP - 1 VIEW  Comparison: Portable pelvis obtained at the same time.  Findings: Again demonstrated is a left total hip prosthesis in satisfactory position and alignment.  No fracture or  dislocation is seen.  IMPRESSION: Satisfactory postoperative appearance of a left total hip prosthesis.  Original Report Authenticated By: Darrol Angel, M.D.    DISCHARGE INSTRUCTIONS: Discharge Orders    Future Appointments: Provider: Department: Dept Phone: Center:   11/17/2011 9:45 AM Sherrie Mustache [facility] [phone] None   11/17/2011 10:15 AM Exie Parody, MD [facility] [phone] None   12/14/2011 11:00 AM Cassell Clement, MD [facility] [phone] None     Future Orders Please Complete By Expires   Diet general      Call MD / Call 911      Comments:   If you experience chest pain or shortness of breath, CALL 911 and be transported to the hospital emergency room.  If you develope a fever above 101 F, pus (white drainage) or increased drainage or redness at the wound, or calf pain, call your surgeon's office.   Constipation Prevention      Comments:   Drink plenty of fluids.  Prune juice may be helpful.  You may use a stool softener, such as Colace (over the counter) 100 mg twice a day.  Use MiraLax (over the counter) for constipation as needed.   Increase activity slowly as tolerated      Patient may shower      Comments:   You may shower without a dressing once there is no drainage.  Do not wash over the wound.  If drainage remains, cover wound with plastic wrap and then shower.   Weight bearing as tolerated      Driving restrictions      Comments:   No driving for 6 weeks   Lifting restrictions      Comments:   No lifting for 6 weeks   Follow the hip precautions as taught in Physical Therapy      TED hose      Comments:   Use stockings (TED hose) for 3 weeks on operative leg(s).  You may remove them at night for sleeping.   Change dressing      Comments:   You may change your dressing on Sunday, then change the dressing daily with sterile 4 x 4 inch gauze dressing and paper tape.  You may clean the incision with alcohol prior to redressing       DISCHARGE MEDICATIONS:   Medication List  As of 10/28/2011  8:35 AM   STOP taking these medications         celecoxib 200 MG capsule      hydrochlorothiazide 25 MG tablet      HYDROcodone-acetaminophen 10-500 MG per tablet      loratadine-pseudoephedrine 10-240 MG per 24 hr tablet      multivitamin with minerals Tabs         TAKE these medications         benazepril 20 MG tablet   Commonly known as: LOTENSIN   Take 20 mg by mouth daily.      bisoprolol 5 MG tablet   Commonly known as: ZEBETA   Take 5 mg by mouth daily before breakfast.      buPROPion 300 MG 24 hr tablet   Commonly known as: WELLBUTRIN XL   Take 300 mg by mouth daily before breakfast.      CALCIUM + D PO   Take 500 mg by mouth daily.      cyclobenzaprine 10 MG tablet   Commonly known as: FLEXERIL   Take 10 mg by mouth as needed. Muscle spasms      doxycycline 100 MG tablet   Commonly known as: VIBRA-TABS   Take 1 tablet (100 mg total) by mouth every 12 (twelve) hours.      estradiol 2 MG tablet   Commonly known as: ESTRACE   Take 2 mg by mouth daily before breakfast.      HYDROcodone-acetaminophen 5-325 MG per tablet   Commonly known as: NORCO   Take 1-2 tablets by mouth every 4 (four) hours as needed for pain.      lansoprazole 15 MG capsule   Commonly known as: PREVACID   Take 15 mg by mouth daily.      montelukast 10 MG tablet   Commonly known as: SINGULAIR   Take 10 mg by mouth as needed. For breathing nasal drainage congestion relief      rivaroxaban 10 MG Tabs tablet   Commonly known as: XARELTO   Take 1 tablet (10 mg total) by mouth daily.            FOLLOW UP VISIT:   Follow-up Information    Follow up with [facility] on 11/09/2011.   Contact information:   [address] [place] [address] [phone]          DISPOSITION:  Home    CONDITION:  Stable   Novalyn Lajara 10/28/2011, 8:35 AM

## 2011-10-27 NOTE — Progress Notes (Signed)
0730 Critical lab of Hgb 6.6 called from lab. Reported to on coming nurse.

## 2011-10-27 NOTE — Progress Notes (Signed)
Orthopedic Tech Progress Note Patient Details:  [name] 12-06-1950 [id number]  Patient ID: Kari Collum Lorson, female   DOB: Apr 03, 1951, 61 y.o.   MRN: [id number] Confirmed patient has knee immobilizer in room.  Sarit Sparano T 10/27/2011, 2:03 PM

## 2011-10-27 NOTE — Progress Notes (Signed)
Physical Therapy Treatment Patient Details Name: Ronnald Collum Escorcia MRN: [id number] DOB: 1951-02-11 Today's Date: 10/27/2011 Time: 1478-2956 PT Time Calculation (min): 26 min  PT Assessment / Plan / Recommendation Comments on Treatment Session  Pt making steady progress with PT goals & mobility.  Pt still receiving blood transfusion but reports she is feeling somewhat better than yesterday.  Pt will need to trial steps before d/cing home.      Follow Up Recommendations  Home health PT;Supervision/Assistance - 24 hour    Barriers to Discharge        Equipment Recommendations  Rolling walker with 5" wheels    Recommendations for Other Services    Frequency 7X/week   Plan Discharge plan remains appropriate;Frequency remains appropriate    Precautions / Restrictions Precautions Precautions: Posterior Hip Precaution Comments: Pt able to verbalize 3/3 hip precautions Restrictions Weight Bearing Restrictions: Yes LLE Weight Bearing: Weight bearing as tolerated     Pertinent Vitals/Pain 6/10  Hip.  Pt reports she received pain meds ~1.5 hrs before PT session.      Mobility  Bed Mobility Bed Mobility: Supine to Sit;Sit to Supine Supine to Sit: 4: Min assist;HOB flat Sit to Supine: 4: Min guard Details for Bed Mobility Assistance: Cues for technique & reinforcement of hip precautions.  Assist for LLE & to lift shoulders/trunk to sitting upright.  Pt used UE's to assist LLE back onto bed with sit>supine Transfers Transfers: Sit to Stand;Stand to Sit Sit to Stand: 4: Min assist;With upper extremity assist;From bed Stand to Sit: 4: Min guard;With upper extremity assist;To bed Details for Transfer Assistance: Cues for safest hand placement & LLE positioning.   Ambulation/Gait Ambulation/Gait Assistance: 4: Min guard Ambulation Distance (Feet): 25 Feet Assistive device: Rolling walker Ambulation/Gait Assistance Details: Cues for sequencing & RW management.    Gait Pattern: Step-to  pattern;Antalgic;Decreased stance time - left;Decreased weight shift to left Stairs: No Wheelchair Mobility Wheelchair Mobility: No    Exercises Total Joint Exercises Ankle Circles/Pumps: AROM;Both;10 reps Heel Slides: AROM;Left;10 reps Hip ABduction/ADduction: AROM;Left;10 reps Long Arc Quad: AROM;Left;10 reps Marching in Standing: AROM;Left;10 reps;Seated     PT Goals Acute Rehab PT Goals Potential to Achieve Goals: Good PT Goal: Supine/Side to Sit - Progress: Progressing toward goal PT Goal: Sit to Supine/Side - Progress: Progressing toward goal PT Goal: Sit to Stand - Progress: Progressing toward goal PT Goal: Stand to Sit - Progress: Progressing toward goal PT Goal: Ambulate - Progress: Progressing toward goal PT Goal: Perform Home Exercise Program - Progress: Progressing toward goal  Visit Information  Last PT Received On: 10/27/11 Assistance Needed: +1    Subjective Data      Cognition  Overall Cognitive Status: Appears within functional limits for tasks assessed/performed Arousal/Alertness: Awake/alert Orientation Level: Appears intact for tasks assessed Behavior During Session: [facility] for tasks performed    Balance  Balance Balance Assessed: No  End of Session PT - End of Session Equipment Utilized During Treatment: Gait belt Activity Tolerance: Patient tolerated treatment well Patient left: in bed;with call bell/phone within reach;with family/visitor present Nurse Communication: Mobility status    Verdell Face, [place] [phone] 10/27/2011

## 2011-10-28 LAB — BASIC METABOLIC PANEL
BUN: 11 mg/dL (ref 6–23)
CO2: 21 mEq/L (ref 19–32)
Calcium: 7.5 mg/dL — ABNORMAL LOW (ref 8.4–10.5)
Creatinine, Ser: 0.58 mg/dL (ref 0.50–1.10)
GFR calc non Af Amer: >90 mL/min (ref >90)
Glucose, Bld: 91 mg/dL (ref 70–99)
Sodium: 127 mEq/L — ABNORMAL LOW (ref 135–145)

## 2011-10-28 LAB — TYPE AND SCREEN: ABO/RH(D): A POS

## 2011-10-28 LAB — CBC
HCT: 22.9 % — ABNORMAL LOW (ref 36.0–46.0)
Hemoglobin: 8.4 g/dL — ABNORMAL LOW (ref 12.0–15.0)
MCH: 33.1 pg (ref 26.0–34.0)
MCHC: 36.7 g/dL — ABNORMAL HIGH (ref 30.0–36.0)
MCV: 90.2 fL (ref 78.0–100.0)

## 2011-10-28 NOTE — Progress Notes (Signed)
D/C instructions reviewed with patient and 2 daughters. RX x 3 given. hh equipment already taken home. hh services arranged with [facility]. All questions answered. Pt d/c'ed via wheelchair in stable condition

## 2011-10-28 NOTE — Progress Notes (Signed)
Patient ID: [name], female   DOB: [birth year], 61 y.o.   MRN: [id number] PATIENT ID: [name]        MRN:  [id number]          DOB/AGE: 16-Sep-1950 / 61 y.o.  Kari Campbell, MD   Kari Code, PA-C [address] [place], [place], [place]  [address]                             [phone]   PROGRESS NOTE  Subjective:  negative for Chest Pain  negative for Shortness of Breath  negative for Nausea/Vomiting   negative for Calf Pain  positive for Bowel Movement   Tolerating Diet: yes         Patient reports pain as mild.       Objective: Vital signs in last 24 hours:   Patient Vitals for the past 24 hrs:  BP Temp Pulse Resp SpO2  10/28/11 0549 139/81 mmHg 98.3 F (36.8 C) 95  18  100 %  10/28/11 0400 - - - 16  93 %  10/28/11 0000 - - - 18  94 %  10/27/11 2007 128/74 mmHg 98.9 F (37.2 C) 99  18  -  10/27/11 1805 134/71 mmHg 98.6 F (37 C) 93  18  -  10/27/11 1715 126/66 mmHg 98.2 F (36.8 C) 91  18  -  10/27/11 1611 119/61 mmHg 97.6 F (36.4 C) 91  18  -  10/27/11 1515 112/63 mmHg 98 F (36.7 C) 91  16  -  10/27/11 1500 119/64 mmHg 98.3 F (36.8 C) 77  16  -  10/27/11 1220 109/62 mmHg 98.4 F (36.9 C) 63  18  -  10/27/11 1200 - - - 16  91 %  10/27/11 1120 121/67 mmHg 98.4 F (36.9 C) 95  18  -  10/27/11 1020 115/66 mmHg 99.2 F (37.3 C) 89  16  -  10/27/11 1004 86/59 mmHg 98.6 F (37 C) 95  16  -      Intake/Output from previous day:   07/11 0701 - 07/12 0700 In: 993.3 [P.O.:600] Out: -    Intake/Output this shift:       Intake/Output      07/11 0701 - 07/12 0700 07/12 0701 - 07/13 0700   P.O. 600    Blood 393.3    Total Intake(mL/kg) 993.3 (12.1)    Net +993.3         Urine Occurrence 9 x       LABORATORY DATA:  Basename 10/28/11 0540 10/27/11 0640 10/26/11 0605  WBC 8.5 11.6* 6.5  HGB 8.4* 6.6* 7.8*  HCT 22.9* 18.4* 22.3*  PLT 198 212 246    Basename 10/28/11 0540 10/27/11 0640 10/26/11 0605 10/24/11 1303  NA 127* 126* 126* 130*    K 3.6 3.7 4.1 4.6  CL 93* 93* 93* 98  CO2 21 20 21 21   BUN 11 15 17 15   CREATININE 0.58 0.66 0.77 0.77  GLUCOSE 91 107* 112* 104*  CALCIUM 7.5* 7.6* 8.0* 9.4   Lab Results  Component Value Date   INR 1.12 10/14/2011   INR 0.94 07/05/2011   INR 1.16 05/10/2011    Examination:  General appearance: alert and no distress  Wound Exam: draining   Drainage:  Scant/small amount Serosanguinous exudate  Motor Exam: EHL, FHL, Anterior Tibial and Posterior Tibial Intact  Sensory Exam: Superficial Peroneal, Deep Peroneal and  Tibial normal  Vascular Exam: Normal  Assessment:    3 Days Post-Op  Procedure(s) (LRB): TOTAL HIP ARTHROPLASTY (Left)  ADDITIONAL DIAGNOSIS:  Principal Problem:  *Osteoarthritis of hip Active Problems:  Postoperative anemia due to acute blood loss  Hyponatremia  WBC disease  Ileus  Acute Blood Loss Anemia   Plan: Physical Therapy as ordered Weight Bearing as Tolerated (WBAT)  DVT Prophylaxis:  Xarelto  DISCHARGE PLAN: Home  DISCHARGE NEEDS: HHPT, Walker and 3-in-1 comode seat  Comfortable,no nausea, wishes to be D/C'd-will send home after PT, dressing changed       [name] W 10/28/2011, 8:10 AM

## 2011-10-28 NOTE — Progress Notes (Signed)
PT Progress Note:     10/28/11 1247  PT Visit Information  Last PT Received On 10/28/11  Assistance Needed +1  PT Time Calculation  PT Start Time 1138  PT Stop Time 1150  PT Time Calculation (min) 12 min  Precautions  Precautions Posterior Hip  Restrictions  LLE Weight Bearing WBAT  Cognition  Overall Cognitive Status Appears within functional limits for tasks assessed/performed  Arousal/Alertness Awake/alert  Orientation Level Appears intact for tasks assessed  Behavior During Session [facility] for tasks performed  Bed Mobility  Bed Mobility Not assessed  Transfers  Transfers Sit to Stand;Stand to Sit  Sit to Stand 5: Supervision;With upper extremity assist;With armrests;From chair/3-in-1  Stand to Sit 5: Supervision;With upper extremity assist;With armrests;To chair/3-in-1  Ambulation/Gait  Ambulation/Gait Assistance 5: Supervision  Ambulation Distance (Feet) 40 Feet  Assistive device Rolling walker  Ambulation/Gait Assistance Details cues for increased heel strike.    Gait Pattern Step-to pattern;Antalgic;Decreased step length - right;Decreased stance time - left  Stairs No  Wheelchair Mobility  Wheelchair Mobility No  PT - End of Session  Equipment Utilized During Treatment Gait belt  Activity Tolerance Patient tolerated treatment well  Patient left in chair;with call bell/phone within reach;with family/visitor present  Nurse Communication Mobility status  PT - Assessment/Plan  Comments on Treatment Session Pt moving well enough to safely enough to d/c home today with daughter.     PT Plan Discharge plan remains appropriate;Frequency remains appropriate  PT Frequency 7X/week  Follow Up Recommendations Home health PT;Supervision/Assistance - 24 hour  Equipment Recommended Rolling walker with 5" wheels  Acute Rehab PT Goals  Time For Goal Achievement 11/02/11  PT Goal: Sit to Stand - Progress Progressing toward goal  PT Goal: Stand to Sit - Progress Progressing toward  goal  PT Goal: Ambulate - Progress Progressing toward goal     Pain:  5/10  Hip with activity.     Verdell Face, [place] [phone] 10/28/2011

## 2011-10-28 NOTE — Progress Notes (Signed)
OT Cancellation Note  Treatment cancelled today due to patient's refusal to participate. Did verbally instruct how to use AE, safely step into shower. Pt to have HHOT. Will check back tomorrow if pt is not d/c'd home today.  Inocencio Roy A, OTR/L [id number] 10/28/2011, 10:46 AM

## 2011-10-28 NOTE — Progress Notes (Signed)
PT Progress Note:     10/28/11 0830  PT Visit Information  Last PT Received On 10/28/11  Assistance Needed +1  PT Time Calculation  PT Start Time 0830  PT Stop Time 0853  PT Time Calculation (min) 23 min  Precautions  Precautions Posterior Hip  Precaution Comments Pt able to verbalize 3/3 hip precautions  Restrictions  LLE Weight Bearing WBAT  Cognition  Overall Cognitive Status Appears within functional limits for tasks assessed/performed  Arousal/Alertness Awake/alert  Orientation Level Appears intact for tasks assessed  Behavior During Session [facility] for tasks performed  Bed Mobility  Bed Mobility Supine to Sit;Sitting - Scoot to Edge of Bed  Details for Bed Mobility Assistance Pt's daughter assisted with LLE management.    Transfers  Transfers Sit to Stand;Stand to Sit  Sit to Stand 4: Min guard;With upper extremity assist;With armrests;From bed;From chair/3-in-1  Stand to Sit 5: Supervision;With upper extremity assist;With armrests;To chair/3-in-1  Details for Transfer Assistance Cues for LLE positioning.  Pt tends to lean more towards Rt side & keep weight off of Lt hip.    Ambulation/Gait  Ambulation/Gait Assistance 4: Min guard  Ambulation Distance (Feet) 10 Feet  Assistive device Rolling walker  Ambulation/Gait Assistance Details Distance limited due to pt c/o nausea & requesting to receive her reflux medication before ambulating anymore.  RN notified.    Gait Pattern Step-to pattern;Antalgic  Stairs Yes  Stairs Assistance 4: Min assist  Stairs Assistance Details (indicate cue type and reason) cues for technique.  Educated pt on backwards with RW & forwards with rail on Lt & HHA on Rt.  Pt opting to use forwards technique with rail + HHA.    Stair Management Technique One rail Left;Forwards;Other (comment) (HHA on Rt.)  Number of Stairs 2   Wheelchair Mobility  Wheelchair Mobility No  Balance  Balance Assessed No  PT - End of Session  Equipment Utilized During  Treatment Gait belt  Activity Tolerance Patient limited by pain;Other (comment) (nausea)  Patient left in chair;with call bell/phone within reach;with family/visitor present  Nurse Communication Mobility status  PT - Assessment/Plan  Comments on Treatment Session Initiated stair training this session.  Pt's daughter present entire session & assisted pt with stairs.  Pt deferred ambulation due to pain & nausea.  RN made aware.    PT Plan Discharge plan remains appropriate;Frequency remains appropriate  PT Frequency 7X/week  Follow Up Recommendations Home health PT;Supervision/Assistance - 24 hour  Equipment Recommended Rolling walker with 5" wheels  Acute Rehab PT Goals  Time For Goal Achievement 11/02/11  PT Goal: Supine/Side to Sit - Progress Progressing toward goal  PT Goal: Sit to Stand - Progress Progressing toward goal  PT Goal: Stand to Sit - Progress Progressing toward goal  PT Goal: Up/Down Stairs - Progress Progressing toward goal  PT General Charges  $$ ACUTE PT VISIT 1 Procedure  PT Treatments  $Therapeutic Activity 23-37 mins     Pain:  9/10  Knee.  RN notified & administered pain medication.  Pt also c/o nausea & requesting reflux med.    Verdell Face, [place] [phone] 10/28/2011

## 2011-10-29 LAB — BODY FLUID CULTURE

## 2011-10-30 LAB — ANAEROBIC CULTURE: Gram Stain: NONE SEEN

## 2011-11-03 LAB — POCT I-STAT 4, (NA,K, GLUC, HGB,HCT): Hemoglobin: 7.5 g/dL — ABNORMAL LOW (ref 12.0–15.0)

## 2011-11-07 ENCOUNTER — Other Ambulatory Visit: Payer: BC Managed Care – PPO | Admitting: Lab

## 2011-11-07 ENCOUNTER — Ambulatory Visit: Payer: BC Managed Care – PPO

## 2011-11-07 ENCOUNTER — Ambulatory Visit: Payer: BC Managed Care – PPO | Admitting: Oncology

## 2011-11-17 ENCOUNTER — Telehealth: Payer: Self-pay | Admitting: Oncology

## 2011-11-17 ENCOUNTER — Ambulatory Visit (HOSPITAL_BASED_OUTPATIENT_CLINIC_OR_DEPARTMENT_OTHER): Payer: BC Managed Care – PPO | Admitting: Oncology

## 2011-11-17 ENCOUNTER — Other Ambulatory Visit (HOSPITAL_BASED_OUTPATIENT_CLINIC_OR_DEPARTMENT_OTHER): Payer: BC Managed Care – PPO | Admitting: Lab

## 2011-11-17 ENCOUNTER — Encounter: Payer: Self-pay | Admitting: Oncology

## 2011-11-17 VITALS — BP 151/93 | HR 84 | Temp 97.0°F | Ht 64.0 in | Wt 178.6 lb

## 2011-11-17 DIAGNOSIS — E538 Deficiency of other specified B group vitamins: Secondary | ICD-10-CM

## 2011-11-17 DIAGNOSIS — D4620 Refractory anemia with excess of blasts, unspecified: Secondary | ICD-10-CM

## 2011-11-17 DIAGNOSIS — C9310 Chronic myelomonocytic leukemia not having achieved remission: Secondary | ICD-10-CM

## 2011-11-17 DIAGNOSIS — D649 Anemia, unspecified: Secondary | ICD-10-CM

## 2011-11-17 DIAGNOSIS — F17200 Nicotine dependence, unspecified, uncomplicated: Secondary | ICD-10-CM

## 2011-11-17 DIAGNOSIS — C9210 Chronic myeloid leukemia, BCR/ABL-positive, not having achieved remission: Secondary | ICD-10-CM

## 2011-11-17 DIAGNOSIS — E871 Hypo-osmolality and hyponatremia: Secondary | ICD-10-CM

## 2011-11-17 HISTORY — DX: Chronic myelomonocytic leukemia not having achieved remission: C93.10

## 2011-11-17 LAB — CBC WITH DIFFERENTIAL/PLATELET
Basophils Absolute: 0 10*3/uL (ref 0.0–0.1)
Eosinophils Absolute: 0.3 10*3/uL (ref 0.0–0.5)
HGB: 10.2 g/dL — ABNORMAL LOW (ref 11.6–15.9)
LYMPH%: 36.1 % (ref 14.0–49.7)
MCV: 100.1 fL (ref 79.5–101.0)
MONO#: 1.4 10*3/uL — ABNORMAL HIGH (ref 0.1–0.9)
MONO%: 27.5 % — ABNORMAL HIGH (ref 0.0–14.0)
NEUT#: 1.5 10*3/uL (ref 1.5–6.5)
Platelets: 470 10*3/uL — ABNORMAL HIGH (ref 145–400)
RDW: 16.8 % — ABNORMAL HIGH (ref 11.2–14.5)
WBC: 5.1 10*3/uL (ref 3.9–10.3)

## 2011-11-17 MED ORDER — CYANOCOBALAMIN 1000 MCG/ML IJ SOLN: 1000.0000 ug | INTRAMUSCULAR | Status: DC

## 2011-11-17 NOTE — Patient Instructions (Addendum)
1.  Diagnosis:  CMML (chronic myelomonocytic leukemia).  You also have mild VitB12 deficiency.   2.  Description:  Despite the name "leukemia," CMML also has features of myelodysplastic syndrome and is treated as such.   Pure Vitamin B12 deficiency normally does not cause obvious dysplastic features in many cell lines.  3.  Risk stratification:  Low risk (since you only have anemia; you do not have mutation of chromosomes; you do not have low white count or low platelet count; you do not have increased blast count). 4.  Treatment:  Consider starting Aranesp injection every 3 weeks with goal to improve your Hgb and decrease need for frequent blood transfusion which can result in iron overload.   In the future, if we see low white count and platelet count, we may consider repeating bone marrow biopsy to ensure that your CMML has not progressed to higher risk category which may benefit from chemo (Vidaza injection).  5.  Potential risk of Aranesp:  Slight increase risk of stroke and heart attack. 6.  Second opinion: at [facility], or [facility] for 2nd opinion in case they have a clinical trial for low risk CMML?

## 2011-11-17 NOTE — Telephone Encounter (Signed)
gv pt appt schedule for August 2013 thru January 2014.

## 2011-11-17 NOTE — Progress Notes (Signed)
[facility]  [phone] [phone]   OFFICE PROGRESS NOTE   Cc:  Cassell Clement, MD  DIAGNOSIS:  1.  chronic myelomonocytic leukemia (low risk:  With solitary anemia, normal cytogenetics and normal blast count) 2.  Vitamin B12 deficiency.  CURRENT THERAPY:  Vitamin B12 injection.   INTERVAL HISTORY: [name] 61 y.o. female returns for regular follow up to go over bone marrow biopsy result.  She recently underwent left hip replacement.  She is much more ambulatory now compared to before surgery.  She still has some left hip discomfort; however, it is nothing compared to preop.  She has some mild fatigue; DOE now but she attributed this to deconditioning since she had not been very mobile for a while.   Patient denies fever, anorexia, weight loss, fatigue, headache, visual changes, confusion, drenching night sweats, palpable lymph node swelling, mucositis, odynophagia, dysphagia, nausea vomiting, jaundice, chest pain, palpitation, productive cough, gum bleeding, epistaxis, hematemesis, hemoptysis, abdominal pain, abdominal swelling, early satiety, melena, hematochezia, hematuria, skin rash, spontaneous bleeding, joint swelling, joint pain, heat or cold intolerance, bowel bladder incontinence, back pain, focal motor weakness, paresthesia, depression, suicidal or homocidal ideation, feeling hopelessness.   Past Medical History  Diagnosis Date  . Hypertension     She has a past hx of essential  . 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 Inflammation of joint of knee     Since we last last saw her she developed problems with an acute which required surgical drainage by her orthopedist Dr. Cleophas Dunker.  Marland Kitchen MRSA (methicillin resistant Staphylococcus aureus)     Knee surgery drainage was positive for MRSA and she was treated with 3 weeks of doxycycline successfully.  . Diarrhea      Mild  . Exogenous obesity   . Atrial flutter     during hospitalization, 04/2011- related to anemia & illness/stress   . Depression   . GERD (gastroesophageal reflux disease)     2 hosp.- ischemic colitis - residual of Norovirus, 05/2011- sm. bowel obstruction  . Headache     migraine headache on occas, less now than when she was younger   . Arthritis     L hip, back, neck   . History of blood transfusion     04/2011- /w ischemic colitis   . Anemia     will see hematology consult prior to surgery, recommended by Dr. Patty Sermons  . Pneumonia     04/2011- not hospitalized , pt. denies SOB, changes in chest, breathing  . 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 Anemia 12/15/2010    Past Surgical History  Procedure Date  . Knee surgery     I&D- 2008, post laceration   . Colonoscopy 05/16/2011    Procedure: COLONOSCOPY;  Surgeon: Vertell Novak., MD;  Location: [facility];  Service: Endoscopy;  Laterality: N/A;  . Appendectomy 1962  . Abdominal hysterectomy 1988  . Small intestine surgery 1992, 1999  . Laparotomy and lysis of adhesions   . Total hip arthroplasty 10/25/2011    Procedure: TOTAL HIP ARTHROPLASTY;  Surgeon: Valeria Batman, MD;  Location: [facility];  Service: Orthopedics;  Laterality: Left;    Current Outpatient Prescriptions  Medication Sig Dispense Refill  . aspirin 325 MG EC tablet Take 325 mg by mouth daily.      . benazepril (LOTENSIN) 20 MG tablet Take  20 mg by mouth daily.      . bisoprolol (ZEBETA) 5 MG tablet Take 5 mg by mouth daily before breakfast.      . buPROPion (WELLBUTRIN XL) 300 MG 24 hr tablet Take 300 mg by mouth daily before breakfast.       . Calcium Carbonate-Vitamin D (CALCIUM + D PO) Take 500 mg by mouth daily.        . cyclobenzaprine (FLEXERIL) 10 MG tablet Take 10 mg by mouth as needed. Muscle spasms      . esomeprazole (NEXIUM) 40 MG capsule Take 40 mg by mouth 2 (two) times daily.      Marland Kitchen estradiol (ESTRACE) 2 MG tablet Take 2  mg by mouth daily before breakfast.       . lansoprazole (PREVACID) 15 MG capsule Take 15 mg by mouth daily.      . montelukast (SINGULAIR) 10 MG tablet Take 10 mg by mouth as needed. For breathing nasal drainage congestion relief      . rivaroxaban (XARELTO) 10 MG TABS tablet Take 1 tablet (10 mg total) by mouth daily.  30 tablet  0  . cyanocobalamin (,VITAMIN B-12,) 1000 MCG/ML injection Inject 1 mL (1,000 mcg total) into the muscle every 30 (thirty) days.  6 mL  1    ALLERGIES:  is allergic to codeine; tetanus toxoids; and penicillins.  REVIEW OF SYSTEMS:  The rest of the 14-point review of system was negative.   Filed Vitals:   11/17/11 1103  BP: 151/93  Pulse:   Temp:    Wt Readings from Last 3 Encounters:  11/17/11 178 lb 9.6 oz (81.012 kg)  10/25/11 181 lb (82.101 kg)  10/25/11 181 lb (82.101 kg)   ECOG Performance status: 1  PHYSICAL EXAMINATION:   General:  well-nourished in no acute distress.  Eyes:  no scleral icterus.  ENT:  There were no oropharyngeal lesions.  Neck was without thyromegaly.  Lymphatics:  Negative cervical, supraclavicular or axillary adenopathy.  Respiratory: lungs were clear bilaterally without wheezing or crackles.  Cardiovascular:  Regular rate and rhythm, S1/S2, without murmur, rub or gallop.  There was no pedal edema.  GI:  abdomen was soft, flat, nontender, nondistended, without organomegaly.  Muscoloskeletal:  no spinal tenderness of palpation of vertebral spine.  Skin exam was without echymosis, petichae.  Neuro exam was nonfocal.  Patient was able to get on and off exam table without assistance.  Gait was slow; and she needed a cane.   LABORATORY/RADIOLOGY DATA:  Lab Results  Component Value Date   WBC 5.1 11/17/2011   HGB 10.2* 11/17/2011   HCT 29.5* 11/17/2011   PLT 470* 11/17/2011   GLUCOSE 91 10/28/2011   CHOL 216* 12/15/2010   TRIG 68.0 12/15/2010   HDL 92.50 12/15/2010   LDLDIRECT 105.6 12/15/2010   LDLCALC  Value: 115        Total  Cholesterol/HDL:CHD Risk Coronary Heart Disease Risk Table                     Men   Women  1/2 Average Risk   3.4   3.3* 07/20/2007   ALKPHOS 404* 10/17/2011   ALT 46* 10/17/2011   AST 49* 10/17/2011   NA 127* 10/28/2011   K 3.6 10/28/2011   CL 93* 10/28/2011   CREATININE 0.58 10/28/2011   BUN 11 10/28/2011   CO2 21 10/28/2011   INR 1.12 10/14/2011    ASSESSMENT AND PLAN:   1.  CMML (  chronic myelomonocytic leukemia).  You also have mild VitB12 deficiency.   - Patient education.  Despite the name "leukemia," CMML has features of myelodysplastic syndrome and is treated as such.   Pure Vitamin B12 deficiency normally does not cause obvious dysplastic features in many cell lines.  -  Risk stratification:  Low risk (since she only has anemia.  She does not have abnormal cytogenetics.  She does not have pancytopenia or increased blast.  - Consider starting Aranesp injection in the future if her Hgb drops to <10 with goal to improve Hgb and decrease need for frequent blood transfusion which can result in iron overload.   In the future, if she develops pancytopenia, we may consider repeating bone marrow biopsy to ensure that your CMML has not progressed to higher risk category which may benefit from chemo (Vidaza injection).  There is a slight potential risk of Aranesp which is slight increase risk of stroke and heart attack.  Therefore, I do not advocate using this now until her Hgb <10.  I offered her option for 2nd opinion at [facility], or [facility] for 2nd opinion in case they have a clinical trial for low risk CMML.  She deferred this for now.   2.  VitB12 deficiency:  She did not have response with weekly injection x 6 wks.  I advised her to try long term Vit B12 IM injection once monthly.  I gave her Rx for this today.   3.  Hypertension: Slightly HTN today due to 1st time at the [facility]. She is on benazapril, Bisoprolol, HCTZ per PCP.   4.  Smoking: she is not ready to give this up yet.   5.  Depression: Mood was well controlled on Wellbutrin.   6. 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   7.  Follow up:  Lab only appointment monthly to ensure improvement of her Hgb.  I will see her back in about 6 months.    The length of time of the face-to-face encounter was 25 minutes. More than 50% of time was spent counseling and coordination of care.

## 2011-12-14 ENCOUNTER — Ambulatory Visit: Payer: BC Managed Care – PPO | Admitting: Cardiology

## 2011-12-15 ENCOUNTER — Other Ambulatory Visit (HOSPITAL_BASED_OUTPATIENT_CLINIC_OR_DEPARTMENT_OTHER): Payer: BC Managed Care – PPO | Admitting: Lab

## 2011-12-15 DIAGNOSIS — D649 Anemia, unspecified: Secondary | ICD-10-CM

## 2011-12-15 DIAGNOSIS — C9310 Chronic myelomonocytic leukemia not having achieved remission: Secondary | ICD-10-CM

## 2011-12-15 DIAGNOSIS — E538 Deficiency of other specified B group vitamins: Secondary | ICD-10-CM

## 2011-12-15 DIAGNOSIS — C9210 Chronic myeloid leukemia, BCR/ABL-positive, not having achieved remission: Secondary | ICD-10-CM

## 2011-12-15 LAB — CBC WITH DIFFERENTIAL/PLATELET
Basophils Absolute: 0 10*3/uL (ref 0.0–0.1)
Eosinophils Absolute: 0.1 10*3/uL (ref 0.0–0.5)
HCT: 27.9 % — ABNORMAL LOW (ref 34.8–46.6)
LYMPH%: 26.9 % (ref 14.0–49.7)
MCV: 95.5 fL (ref 79.5–101.0)
MONO#: 1.3 10*3/uL — ABNORMAL HIGH (ref 0.1–0.9)
MONO%: 26.5 % — ABNORMAL HIGH (ref 0.0–14.0)
NEUT#: 2.1 10*3/uL (ref 1.5–6.5)
NEUT%: 44.3 % (ref 38.4–76.8)
Platelets: 282 10*3/uL (ref 145–400)
RBC: 2.92 10*6/uL — ABNORMAL LOW (ref 3.70–5.45)
WBC: 4.8 10*3/uL (ref 3.9–10.3)

## 2011-12-15 LAB — TECHNOLOGIST REVIEW

## 2011-12-18 DIAGNOSIS — Z9289 Personal history of other medical treatment: Secondary | ICD-10-CM

## 2011-12-18 HISTORY — DX: Personal history of other medical treatment: Z92.89

## 2011-12-18 HISTORY — PX: OTHER SURGICAL HISTORY: SHX169

## 2011-12-23 ENCOUNTER — Ambulatory Visit: Payer: BC Managed Care – PPO | Admitting: Cardiology

## 2012-01-04 ENCOUNTER — Encounter: Payer: Self-pay | Admitting: Nurse Practitioner

## 2012-01-04 ENCOUNTER — Telehealth: Payer: Self-pay | Admitting: *Deleted

## 2012-01-04 ENCOUNTER — Encounter (INDEPENDENT_AMBULATORY_CARE_PROVIDER_SITE_OTHER): Payer: BC Managed Care – PPO

## 2012-01-04 ENCOUNTER — Ambulatory Visit (INDEPENDENT_AMBULATORY_CARE_PROVIDER_SITE_OTHER): Payer: BC Managed Care – PPO | Admitting: Nurse Practitioner

## 2012-01-04 VITALS — BP 150/90 | HR 91 | Ht 64.0 in

## 2012-01-04 DIAGNOSIS — M79609 Pain in unspecified limb: Secondary | ICD-10-CM

## 2012-01-04 DIAGNOSIS — I5031 Acute diastolic (congestive) heart failure: Secondary | ICD-10-CM

## 2012-01-04 DIAGNOSIS — R609 Edema, unspecified: Secondary | ICD-10-CM

## 2012-01-04 DIAGNOSIS — R0600 Dyspnea, unspecified: Secondary | ICD-10-CM

## 2012-01-04 DIAGNOSIS — R0609 Other forms of dyspnea: Secondary | ICD-10-CM

## 2012-01-04 LAB — D-DIMER, QUANTITATIVE: D-Dimer, Quant: 2.56 ug/mL-FEU — ABNORMAL HIGH (ref 0.00–0.48)

## 2012-01-04 LAB — BASIC METABOLIC PANEL
BUN: 12 mg/dL (ref 6–23)
CO2: 19 mEq/L (ref 19–32)
Calcium: 7.9 mg/dL — ABNORMAL LOW (ref 8.4–10.5)
Chloride: 107 mEq/L (ref 96–112)
Creatinine, Ser: 0.9 mg/dL (ref 0.4–1.2)
GFR: 64 mL/min (ref >60.00)
Glucose, Bld: 100 mg/dL — ABNORMAL HIGH (ref 70–99)
Potassium: 3.4 mEq/L — ABNORMAL LOW (ref 3.5–5.1)
Sodium: 135 mEq/L (ref 135–145)

## 2012-01-04 LAB — CBC WITH DIFFERENTIAL/PLATELET
Basophils Absolute: 0 10*3/uL (ref 0.0–0.1)
Basophils Relative: 0.3 % (ref 0.0–3.0)
Eosinophils Absolute: 0.1 10*3/uL (ref 0.0–0.7)
Eosinophils Relative: 1 % (ref 0.0–5.0)
HCT: 32.8 % — ABNORMAL LOW (ref 36.0–46.0)
Hemoglobin: 11 g/dL — ABNORMAL LOW (ref 12.0–15.0)
Lymphocytes Relative: 15.4 % (ref 12.0–46.0)
Lymphs Abs: 1.3 10*3/uL (ref 0.7–4.0)
MCHC: 33.5 g/dL (ref 30.0–36.0)
MCV: 94.9 fl (ref 78.0–100.0)
Monocytes Absolute: 1.3 10*3/uL — ABNORMAL HIGH (ref 0.1–1.0)
Monocytes Relative: 15.2 % — ABNORMAL HIGH (ref 3.0–12.0)
Neutro Abs: 5.6 10*3/uL (ref 1.4–7.7)
Neutrophils Relative %: 68.1 % (ref 43.0–77.0)
Platelets: 354 10*3/uL (ref 150.0–400.0)
RBC: 3.46 Mil/uL — ABNORMAL LOW (ref 3.87–5.11)
RDW: 17.4 % — ABNORMAL HIGH (ref 11.5–14.6)
WBC: 8.2 10*3/uL (ref 4.5–10.5)

## 2012-01-04 LAB — BRAIN NATRIURETIC PEPTIDE: Pro B Natriuretic peptide (BNP): 2889 pg/mL — ABNORMAL HIGH (ref 0.0–100.0)

## 2012-01-04 MED ORDER — POTASSIUM CHLORIDE CRYS ER 20 MEQ PO TBCR: 20.0000 meq | EXTENDED_RELEASE_TABLET | Freq: Every day | ORAL | Status: DC

## 2012-01-04 MED ORDER — FUROSEMIDE 40 MG PO TABS: 40.0000 mg | ORAL_TABLET | Freq: Every day | ORAL | Status: DC

## 2012-01-04 NOTE — Patient Instructions (Addendum)
We need to get some labs today.  We need to get a venous doppler today  I am going to start you on Lasix 40 mg. Take 2 tablets today and tomorrow and then one a day  I am also going to start you on Potassium 20 meq per day. Take 2 tablets today and tomorrow and then one a day  We need to see you back in a week.   Call the [facility] office at [phone] if you have any questions, problems or concerns.

## 2012-01-04 NOTE — Telephone Encounter (Signed)
Message copied by Burnell Blanks on Wed Jan 04, 2012  2:07 PM ------      Message from: Rosalio Macadamia      Created: Wed Jan 04, 2012  1:14 PM       Ok to report. Needs spiral CT to rule out PE. BNP is up. Diuretics and potassium have been started. No evidence of DVT.

## 2012-01-04 NOTE — Progress Notes (Addendum)
Ronnald Collum Garbers Date of Birth: [birth year] Medical Record #[id number]  History of Present Illness: Ms. [name] is seen today for a work in visit. She is seen for Dr. Patty Sermons. She has multiple medical issues which are as outlined below. These include PAF, ischemic colitis, depression, HTN and tobacco abuse. She has a normal EF per echo earlier this year.   She comes in today. She is in a wheelchair. She is here with her daughter Leotis Shames. She is here due to dyspnea and edema. She was last here in April and seemed to be ok. Had her hip surgery in July and did really well. Went to [place] later in the summer and got a "GI bug". She had to have some Cipro and Flagyl before she could fly back to [place]. She recovered from this and went to the beach at [facility] over Labor Day. While there, she got terribly sick. She had horrible abdominal pain. She was admitted to [facility] with recurrent ischemic colitis. She actually had several areas of her colon removed. She now has a colostomy. She had PAF while there. Was actually placed on amiodarone and digoxin. She was hypotensive and given lots of fluids. Developed a fever and was found to be septic from a PICC line. She was sent home on some type of antibiotic for 10 days. She refused to go home on amiodarone or digoxin. She now feels extremely swollen and is short of breath. She is not able to weigh. Her belly feels full. Her legs do hurt. She is not on any anticoagulation.   Current Outpatient Prescriptions on File Prior to Visit  Medication Sig Dispense Refill  . aspirin 325 MG EC tablet Take 325 mg by mouth daily.      . benazepril (LOTENSIN) 20 MG tablet Take 20 mg by mouth daily.      . bisoprolol (ZEBETA) 5 MG tablet Take 5 mg by mouth daily before breakfast.      . buPROPion (WELLBUTRIN XL) 300 MG 24 hr tablet Take 300 mg by mouth daily before breakfast.       . Calcium Carbonate-Vitamin D (CALCIUM + D PO) Take 500 mg by mouth daily.          . cyanocobalamin (,VITAMIN B-12,) 1000 MCG/ML injection Inject 1 mL (1,000 mcg total) into the muscle every 30 (thirty) days.  6 mL  1  . cyclobenzaprine (FLEXERIL) 10 MG tablet Take 10 mg by mouth as needed. Muscle spasms      . esomeprazole (NEXIUM) 40 MG capsule Take 40 mg by mouth 2 (two) times daily.      Marland Kitchen estradiol (ESTRACE) 2 MG tablet Take 2 mg by mouth daily before breakfast.       . lansoprazole (PREVACID) 15 MG capsule Take 15 mg by mouth daily.      . montelukast (SINGULAIR) 10 MG tablet Take 10 mg by mouth as needed. For breathing nasal drainage congestion relief      . furosemide (LASIX) 40 MG tablet Take 1 tablet (40 mg total) by mouth daily.  30 tablet  11  . potassium chloride SA (K-DUR,KLOR-CON) 20 MEQ tablet Take 1 tablet (20 mEq total) by mouth daily.  30 tablet  3    Allergies  Allergen Reactions  . Codeine Nausea And Vomiting  . Tetanus Toxoids Other (See Comments)    serum  . Xarelto (Rivaroxaban)     Hives; rash  . Penicillins Hives and Rash    Past Medical  History  Diagnosis Date  . Hypertension     She has a past hx of essential  . 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 Inflammation of joint of knee     Since we last last saw her she developed problems with an acute which required surgical drainage by her orthopedist Dr. Cleophas Dunker.  Marland Kitchen MRSA (methicillin resistant Staphylococcus aureus)     Knee surgery drainage was positive for MRSA and she was treated with 3 weeks of doxycycline successfully.  . Diarrhea     Mild  . Exogenous obesity   . Atrial flutter     during hospitalization, 04/2011- related to anemia & illness/stress   . Depression   . GERD (gastroesophageal reflux disease)     2 hosp.- ischemic colitis - residual of Norovirus, 05/2011- sm. bowel obstruction  . Headache     migraine headache on occas, less now than when she was younger   . Arthritis     L hip, back,  neck   . History of blood transfusion     04/2011- /w ischemic colitis   . Anemia     will see hematology consult prior to surgery, recommended by Dr. Patty Sermons  . Pneumonia     04/2011- not hospitalized , pt. denies SOB, changes in chest, breathing  . 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 Anemia 12/15/2010    Past Surgical History  Procedure Date  . Knee surgery     I&D- 2008, post laceration   . Colonoscopy 05/16/2011    Procedure: COLONOSCOPY;  Surgeon: Vertell Novak., MD;  Location: [facility];  Service: Endoscopy;  Laterality: N/A;  . Appendectomy 1962  . Abdominal hysterectomy 1988  . Small intestine surgery 1992, 1999  . Laparotomy and lysis of adhesions   . Total hip arthroplasty 10/25/2011    Procedure: TOTAL HIP ARTHROPLASTY;  Surgeon: Valeria Batman, MD;  Location: [facility] OR;  Service: Orthopedics;  Laterality: Left;    History  Smoking status  . Current Some Day Smoker -- 0.2 packs/day for 20 years  Smokeless tobacco  . Never Used    History  Alcohol Use  . Yes    1 - 2 glasses of wine per week    Family History  Problem Relation Age of Onset  . Stroke Father   . Atrial fibrillation Father   . Hypertension Mother     Review of Systems: The review of systems is per the HPI.  All other systems were reviewed and are negative.  Physical Exam: BP 150/90  Pulse 91  Ht 5\' 4"  (1.626 m) Patient is very pleasant and in no acute distress but she does look chronically ill. Skin is warm and dry. Color is normal.  HEENT is unremarkable. Normocephalic/atraumatic. PERRL. Sclera are nonicteric. Neck is supple. No masses. No JVD. Lungs are clear. Cardiac exam shows a regular rate and rhythm today. Abdomen is soft. Colostomy in place. Extremities are with 2 to 3+ edema up both legs. Gait is not tested. She is in a wheelchair. ROM appears intact. No gross neurologic deficits noted.   LABORATORY DATA: PENDING   Assessment / Plan:  1. Edema - most likely  volume overload/diastolic heart failure. She has a normal EF per echo earlier this year. I worry that she could have DVT. Needs venous dopplers today. I have started Lasix and Potassium. Will also check  labs today. If she fails to improve or has DVT, she will need hospitalization.   2. Dyspnea - will check labs to include d dimer. Will need to rule out DVT. Diuretics are started.   3. PAF - she is in sinus today by physical exam.    4. Ischemic colitis - she is going to make follow up visits with Dr. Randa Evens and Abbey Chatters.   5. Sepsis from PICC line - she is on antibiotics but does not know the name.   Will tentatively see her back in about a week unless the studies are abnormal.   Patient is agreeable to this plan and will call if any problems develop in the interim.    Addendum:  The doppler study is negative. Will await labs and start diuretics.

## 2012-01-04 NOTE — Telephone Encounter (Signed)
Advised patient, sent to [facility] to get scheduled and to [facility] for PA

## 2012-01-05 ENCOUNTER — Ambulatory Visit
Admission: RE | Admit: 2012-01-05 | Discharge: 2012-01-05 | Disposition: A | Discharge status: Home / Self Care | Payer: BC Managed Care – PPO | Source: Ambulatory Visit | Attending: Nurse Practitioner | Admitting: Nurse Practitioner

## 2012-01-05 ENCOUNTER — Encounter: Payer: Self-pay | Admitting: *Deleted

## 2012-01-05 DIAGNOSIS — R0600 Dyspnea, unspecified: Secondary | ICD-10-CM

## 2012-01-05 MED ORDER — IOHEXOL 350 MG/ML SOLN: 80.0000 mL | Freq: Once | INTRAVENOUS | Status: AC | PRN

## 2012-01-05 NOTE — Patient Instructions (Signed)
Rose from CT called, patient present to have Spiral CT done.  Per Rose, no one in our office has been able to place an IV in the patient.  Rose was asking what we wanted done for this patient.  Sable Feil, and spoke to Dr. [place].  They advised to keep the patient on her diuretics.  Cancel the CT, advise the patient to continue fluid pills, and if any worsening coughing, or coughing of blood occurs, go straight to the ED.  Rose in  CT made aware and notified patient of recommendations.  Vista Mink, CMA

## 2012-01-05 NOTE — Telephone Encounter (Signed)
Patient aware of CT scheduled for today

## 2012-01-09 ENCOUNTER — Telehealth: Payer: Self-pay | Admitting: *Deleted

## 2012-01-09 NOTE — Telephone Encounter (Signed)
Called patient to follow up and she how she was feeling (ov with Lawson Fiscal last week).  Patient was scheduled for CT PE protocol last week and they were unable get, Improper IV Access/Infiltrate IV (Unable to get IV access after 4 attempts by 3 people. Cancel for now. Go to ER if symptoms worsen).  Left message for patient to call back

## 2012-01-09 NOTE — Telephone Encounter (Signed)
F/U   Returning call back to nurse.   

## 2012-01-09 NOTE — Telephone Encounter (Signed)
Patient thinks her swelling down some but still a long ways to go (down about 1/3 per patient).  Having nausea but thinks coming from antibiotics and has about 4 days left.  Advised to call if she starts to feel worse otherwise keep appointment Wednesday.

## 2012-01-10 ENCOUNTER — Ambulatory Visit: Payer: BC Managed Care – PPO | Admitting: Nurse Practitioner

## 2012-01-11 ENCOUNTER — Encounter: Payer: Self-pay | Admitting: Nurse Practitioner

## 2012-01-11 ENCOUNTER — Ambulatory Visit (INDEPENDENT_AMBULATORY_CARE_PROVIDER_SITE_OTHER): Payer: BC Managed Care – PPO | Admitting: Nurse Practitioner

## 2012-01-11 ENCOUNTER — Telehealth: Payer: Self-pay | Admitting: *Deleted

## 2012-01-11 ENCOUNTER — Other Ambulatory Visit: Payer: Self-pay | Admitting: *Deleted

## 2012-01-11 VITALS — BP 158/86 | HR 80 | Ht 64.0 in | Wt 183.0 lb

## 2012-01-11 DIAGNOSIS — E8770 Fluid overload, unspecified: Secondary | ICD-10-CM

## 2012-01-11 DIAGNOSIS — E8779 Other fluid overload: Secondary | ICD-10-CM

## 2012-01-11 DIAGNOSIS — Z9049 Acquired absence of other specified parts of digestive tract: Secondary | ICD-10-CM

## 2012-01-11 DIAGNOSIS — Z9889 Other specified postprocedural states: Secondary | ICD-10-CM

## 2012-01-11 LAB — BASIC METABOLIC PANEL
BUN: 15 mg/dL (ref 6–23)
CO2: 24 mEq/L (ref 19–32)
Calcium: 7.1 mg/dL — ABNORMAL LOW (ref 8.4–10.5)
Chloride: 93 mEq/L — ABNORMAL LOW (ref 96–112)
Creatinine, Ser: 1.1 mg/dL (ref 0.4–1.2)
GFR: 51.22 mL/min — ABNORMAL LOW (ref >60.00)
Glucose, Bld: 65 mg/dL — ABNORMAL LOW (ref 70–99)
Potassium: 2.6 mEq/L — CL (ref 3.5–5.1)
Sodium: 135 mEq/L (ref 135–145)

## 2012-01-11 NOTE — Telephone Encounter (Signed)
Notes Recorded by Regis Bill, LPN on 1/61/0960 at 6:09 PM Still unable to reach patient, tried to call to verify receiving message Notes Recorded by Regis Bill, LPN on 4/54/0981 at 4:31 PM Left message on home and cell number. asked that she call back to let us know message received and to schedule lab recheck

## 2012-01-11 NOTE — Telephone Encounter (Signed)
Message copied by Burnell Blanks on Wed Jan 11, 2012  6:09 PM ------      Message from: Rosalio Macadamia      Created: Wed Jan 11, 2012  4:26 PM       Please call. Potassium is too low. Needs to take extra 60 meq of potassium now. Thursday  & Friday take 40 meq BID. Recheck Friday

## 2012-01-11 NOTE — Addendum Note (Signed)
Addended by: Tonita Phoenix on: 01/11/2012 12:04 PM   Modules accepted: Orders

## 2012-01-11 NOTE — Progress Notes (Signed)
[name] Viets Date of Birth: 07/23/1950 Medical Record #[id number]  History of Present Illness: [name] is seen back today for a one week check. She is seen for Dr. Patty Sermons. She has multiple medical issues which are as outlined below. These include PAF, ischemic colitis, depression, HTN, and tobacco abuse. She has a normal EF per echo from earlier this year.   I saw her a week ago after she had been admitted down at [facility] with another attack of ischemic colitis. She ended up having colon surgery with a colostomy placed. She had PAF while hospitalized. Also with MRSA from a PICC line. She was quite volume overloaded. We put her on Lasix last week. Tried to get a spiral CT to rule of PE but no IV access. Dr. [place] and I decided to hold off on the scan. Her doppler of the lower extremities was felt to be ok with no obvious clot but the study was limited due to all the fluid.   She comes in today. She is here alone today. She is doing better. Her swelling is improving. Her breathing is improving. She still has edema but nothing like last week. She feels like her rhythm has been ok. Does not check her blood pressure at home. Is probably ready to try support stockings now. Some nausea reported but she feels like that is due to the antibiotics. She will be finished today with those.  She wants to get back in with Dr. Abbey Chatters. I suspect it will be a couple of months before she can have a reversal of her colostomy.   Current Outpatient Prescriptions on File Prior to Visit  Medication Sig Dispense Refill  . aspirin 325 MG EC tablet Take 325 mg by mouth daily.      . benazepril (LOTENSIN) 20 MG tablet Take 20 mg by mouth daily.      . bisoprolol (ZEBETA) 5 MG tablet Take 5 mg by mouth daily before breakfast.      . buPROPion (WELLBUTRIN XL) 300 MG 24 hr tablet Take 300 mg by mouth daily before breakfast.       . Calcium Carbonate-Vitamin D (CALCIUM + D PO) Take 500 mg by mouth daily.        .  cyanocobalamin (,VITAMIN B-12,) 1000 MCG/ML injection Inject 1 mL (1,000 mcg total) into the muscle every 30 (thirty) days.  6 mL  1  . cyclobenzaprine (FLEXERIL) 10 MG tablet Take 10 mg by mouth as needed. Muscle spasms      . esomeprazole (NEXIUM) 40 MG capsule Take 40 mg by mouth 2 (two) times daily.      Marland Kitchen estradiol (ESTRACE) 2 MG tablet Take 2 mg by mouth daily before breakfast.       . furosemide (LASIX) 40 MG tablet Take 1 tablet (40 mg total) by mouth daily.  30 tablet  11  . lansoprazole (PREVACID) 15 MG capsule Take 15 mg by mouth daily.      . montelukast (SINGULAIR) 10 MG tablet Take 10 mg by mouth as needed. For breathing nasal drainage congestion relief      . potassium chloride SA (K-DUR,KLOR-CON) 20 MEQ tablet Take 1 tablet (20 mEq total) by mouth daily.  30 tablet  3    Allergies  Allergen Reactions  . Codeine Nausea And Vomiting  . Tetanus Toxoids Other (See Comments)    serum  . Xarelto (Rivaroxaban)     Hives; rash  . Penicillins Hives and Rash  Past Medical History  Diagnosis Date  . Hypertension     She has a past hx of essential  . 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 Inflammation of joint of knee     Since we last last saw her she developed problems with an acute which required surgical drainage by her orthopedist Dr. Cleophas Dunker.  Marland Kitchen MRSA (methicillin resistant Staphylococcus aureus)     Knee surgery drainage was positive for MRSA and she was treated with 3 weeks of doxycycline successfully.  . Diarrhea     Mild  . Exogenous obesity   . Atrial flutter     during hospitalization, 04/2011- related to anemia & illness/stress   . Depression   . GERD (gastroesophageal reflux disease)     2 hosp.- ischemic colitis - residual of Norovirus, 05/2011- sm. bowel obstruction  . Headache     migraine headache on occas, less now than when she was younger   . Arthritis     L hip, back, neck    . History of blood transfusion     04/2011- /w ischemic colitis   . Anemia     will see hematology consult prior to surgery, recommended by Dr. Patty Sermons  . Pneumonia     04/2011- not hospitalized , pt. denies SOB, changes in chest, breathing  . 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 Anemia 12/15/2010    Past Surgical History  Procedure Date  . Knee surgery     I&D- 2008, post laceration   . Colonoscopy 05/16/2011    Procedure: COLONOSCOPY;  Surgeon: Vertell Novak., MD;  Location: [facility];  Service: Endoscopy;  Laterality: N/A;  . Appendectomy 1962  . Abdominal hysterectomy 1988  . Small intestine surgery 1992, 1999  . Laparotomy and lysis of adhesions   . Total hip arthroplasty 10/25/2011    Procedure: TOTAL HIP ARTHROPLASTY;  Surgeon: Valeria Batman, MD;  Location: [facility] OR;  Service: Orthopedics;  Laterality: Left;    History  Smoking status  . Current Some Day Smoker -- 0.2 packs/day for 20 years  Smokeless tobacco  . Never Used    History  Alcohol Use  . Yes    1 - 2 glasses of wine per week    Family History  Problem Relation Age of Onset  . Stroke Father   . Atrial fibrillation Father   . Hypertension Mother     Review of Systems: The review of systems is per the HPI.  All other systems were reviewed and are negative.  Physical Exam: BP 158/86  Pulse 80  Ht 5\' 4"  (1.626 m)  Wt 183 lb (83.008 kg)  BMI 31.41 kg/m2 Patient is very pleasant and in no acute distress. Skin is warm and dry. Color is sallow which is chronic.  HEENT is unremarkable. Normocephalic/atraumatic. PERRL. Sclera are nonicteric. Neck is supple. No masses. No JVD. Lungs are clear. Cardiac exam shows a regular rate and rhythm. Abdomen is soft. Colostomy in place. Extremities are with less edema which would be about 1+. Gait is not tested. ROM appears intact. No gross neurologic deficits noted.   LABORATORY DATA: BMET is pending for today.   Wt Readings from Last 3  Encounters:  01/11/12 183 lb (83.008 kg)  11/17/11 178 lb 9.6 oz (81.012 kg)  10/25/11 181 lb (82.101 kg)   Lab Results  Component Value Date  WBC 8.2 01/04/2012   HGB 11.0* 01/04/2012   HCT 32.8* 01/04/2012   PLT 354.0 01/04/2012   GLUCOSE 100* 01/04/2012   CHOL 216* 12/15/2010   TRIG 68.0 12/15/2010   HDL 92.50 12/15/2010   LDLDIRECT 105.6 12/15/2010   LDLCALC  Value: 115        Total Cholesterol/HDL:CHD Risk Coronary Heart Disease Risk Table                     Men   Women  1/2 Average Risk   3.4   3.3* 07/20/2007   ALT 46* 10/17/2011   AST 49* 10/17/2011   NA 135 01/04/2012   K 3.4* 01/04/2012   CL 107 01/04/2012   CREATININE 0.9 01/04/2012   BUN 12 01/04/2012   CO2 19 01/04/2012   TSH 2.474 05/09/2011   INR 1.12 10/14/2011   Assessment / Plan: 1. Volume overload - She is improved clinically. Will keep her on the Lasix for another week. Check a BMET today. See Dr. Patty Sermons in about 2 weeks.  2. PAF - no reports of recurrence  3. Ischemic colitis - she would like to see Dr. Abbey Chatters today.  4. Infected PICC line (MRSA) - she finishes her antibiotics today. I suspect this is the etiology for her nausea.  Overall, I do think she is improved. We will check labs today. Continue diuretics for another week. See Dr. Patty Sermons in 2 weeks.   Patient is agreeable to this plan and will call if any problems develop in the interim.

## 2012-01-11 NOTE — Patient Instructions (Addendum)
Stay on the Lasix for one more week  We are checking labs today  We will get you a visit with Dr. Abbey Chatters for follow up.  We need to see you in 2 weeks  Call the [facility] office at [phone] if you have any questions, problems or concerns.

## 2012-01-12 ENCOUNTER — Telehealth: Payer: Self-pay | Admitting: Oncology

## 2012-01-12 ENCOUNTER — Other Ambulatory Visit: Payer: BC Managed Care – PPO | Admitting: Lab

## 2012-01-12 NOTE — Telephone Encounter (Signed)
Returned call re r/s appt. Lm for pt to call back.

## 2012-01-13 ENCOUNTER — Other Ambulatory Visit: Payer: Self-pay | Admitting: *Deleted

## 2012-01-13 ENCOUNTER — Ambulatory Visit (INDEPENDENT_AMBULATORY_CARE_PROVIDER_SITE_OTHER): Payer: BC Managed Care – PPO | Admitting: *Deleted

## 2012-01-13 ENCOUNTER — Telehealth: Payer: Self-pay | Admitting: *Deleted

## 2012-01-13 DIAGNOSIS — E876 Hypokalemia: Secondary | ICD-10-CM

## 2012-01-13 DIAGNOSIS — I4891 Unspecified atrial fibrillation: Secondary | ICD-10-CM

## 2012-01-13 DIAGNOSIS — I119 Hypertensive heart disease without heart failure: Secondary | ICD-10-CM

## 2012-01-13 DIAGNOSIS — E7800 Pure hypercholesterolemia, unspecified: Secondary | ICD-10-CM

## 2012-01-13 LAB — BASIC METABOLIC PANEL
Calcium: 7 mg/dL — ABNORMAL LOW (ref 8.4–10.5)
Chloride: 95 mEq/L — ABNORMAL LOW (ref 96–112)
Creatinine, Ser: 1 mg/dL (ref 0.4–1.2)
GFR: 57.58 mL/min — ABNORMAL LOW (ref >60.00)
Sodium: 137 mEq/L (ref 135–145)

## 2012-01-13 NOTE — Telephone Encounter (Signed)
Advised patient and scheduled labs Notes Recorded by Lewayne Bunting, MD on 01/13/2012 at 2:41 PM DC lasix, KCL 40 meq po BID for 2 days and then DC. Recheck BMET Monday. [name]

## 2012-01-13 NOTE — Progress Notes (Signed)
Per Norma Fredrickson who ordered the labs:  Take KCL now and again in 4 hours.  Then start KCL BID.  Have BMET rechecked on Tuesday OCtober 1st.  Pt agrees to plan.

## 2012-01-13 NOTE — Telephone Encounter (Signed)
Spoke with patient and she is coming for labs today

## 2012-01-16 ENCOUNTER — Telehealth: Payer: Self-pay | Admitting: *Deleted

## 2012-01-16 ENCOUNTER — Other Ambulatory Visit (INDEPENDENT_AMBULATORY_CARE_PROVIDER_SITE_OTHER): Payer: BC Managed Care – PPO

## 2012-01-16 DIAGNOSIS — E876 Hypokalemia: Secondary | ICD-10-CM

## 2012-01-16 LAB — BASIC METABOLIC PANEL
GFR: 89.92 mL/min (ref >60.00)
Potassium: 3.9 mEq/L (ref 3.5–5.1)
Sodium: 134 mEq/L — ABNORMAL LOW (ref 135–145)

## 2012-01-16 NOTE — Telephone Encounter (Signed)
Agree, Need to see the labs.

## 2012-01-16 NOTE — Telephone Encounter (Signed)
Patient states she is trying to get here for labs.  Woke up this am with shortness of breath and nausea. Patient thinks shortness of breath is coming from stopping Lasix and thinks the potassium is making her feel sick.  She states the swelling in legs may have increased some but very little. Out of the phenergan and does not think Zofran helps as well.  Has taken her nausea her medications and will get here for labs as soon as she can.  Did advise that changes would probably not be made until labs obtained and reviewed.  Will forward to [facility] NP for review

## 2012-01-17 ENCOUNTER — Other Ambulatory Visit: Payer: BC Managed Care – PPO

## 2012-01-17 NOTE — Telephone Encounter (Signed)
Labs reviewed potassium normal.  Discussed with  Dr. Patty Sermons and will have her start Lasix 40 mg 1/2 tablet daily and K+ 20 meq daily. Check labs in 1 week advised patient

## 2012-01-24 ENCOUNTER — Telehealth: Payer: Self-pay | Admitting: *Deleted

## 2012-01-24 ENCOUNTER — Other Ambulatory Visit (INDEPENDENT_AMBULATORY_CARE_PROVIDER_SITE_OTHER): Payer: BC Managed Care – PPO

## 2012-01-24 DIAGNOSIS — E876 Hypokalemia: Secondary | ICD-10-CM

## 2012-01-24 LAB — BASIC METABOLIC PANEL
BUN: 19 mg/dL (ref 6–23)
CO2: 23 mEq/L (ref 19–32)
Calcium: 8 mg/dL — ABNORMAL LOW (ref 8.4–10.5)
Chloride: 99 mEq/L (ref 96–112)
Creatinine, Ser: 1 mg/dL (ref 0.4–1.2)
GFR: 63.21 mL/min (ref >60.00)
Glucose, Bld: 96 mg/dL (ref 70–99)
Potassium: 3.3 mEq/L — ABNORMAL LOW (ref 3.5–5.1)
Sodium: 133 mEq/L — ABNORMAL LOW (ref 135–145)

## 2012-01-24 NOTE — Telephone Encounter (Signed)
Discussed with patient and she is taking lasix 40 mg 1/2 tablet as needed and take K+ on those days (been taking both about every other day). Will discuss with Lawson Fiscal NP to see what she wants to do since potassium 3.3 (patient is going to take a K+ tab now, none today)

## 2012-01-24 NOTE — Telephone Encounter (Signed)
Message copied by Burnell Blanks on Tue Jan 24, 2012  4:10 PM ------      Message from: Rosalio Macadamia      Created: Tue Jan 24, 2012  2:58 PM       What is she actually taking in regards to her potassium?

## 2012-01-25 NOTE — Telephone Encounter (Signed)
Left message to call back  

## 2012-01-26 ENCOUNTER — Telehealth (INDEPENDENT_AMBULATORY_CARE_PROVIDER_SITE_OTHER): Payer: Self-pay

## 2012-01-26 NOTE — Telephone Encounter (Signed)
Pt called wanting Dr Maris Berger assistant to be aware that her out of town records are in epic under media. She has appt 02-07-12.

## 2012-01-26 NOTE — Telephone Encounter (Signed)
Advised patient  Notes Recorded by Rosalio Macadamia, NP on 01/24/2012 at 4:17 PM Would take it every day. Recheck in 2 weeks.

## 2012-01-30 ENCOUNTER — Ambulatory Visit (INDEPENDENT_AMBULATORY_CARE_PROVIDER_SITE_OTHER): Payer: BC Managed Care – PPO | Admitting: Cardiology

## 2012-01-30 ENCOUNTER — Encounter: Payer: Self-pay | Admitting: Cardiology

## 2012-01-30 VITALS — BP 124/82 | HR 82 | Ht 64.5 in | Wt 165.0 lb

## 2012-01-30 DIAGNOSIS — K559 Vascular disorder of intestine, unspecified: Secondary | ICD-10-CM

## 2012-01-30 DIAGNOSIS — I4891 Unspecified atrial fibrillation: Secondary | ICD-10-CM

## 2012-01-30 DIAGNOSIS — K551 Chronic vascular disorders of intestine: Secondary | ICD-10-CM

## 2012-01-30 DIAGNOSIS — I119 Hypertensive heart disease without heart failure: Secondary | ICD-10-CM

## 2012-01-30 NOTE — Progress Notes (Signed)
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  In early 2013 she was hospitalized on the medical service after presenting with sudden onset of severe diarrhea associated with syncope and hypotension.  During the course of that hospitalization she underwent colonoscopy which showed ischemic colitis during that hospital stay she also had episodes of paroxysmal atrial fibrillation which responded to beta blocker during that hospital stay she had an echocardiogram showing normal LV systolic function and normal left atrial size.  Because of her past history of anemia and multiple comorbidities she was not placed on warfarin she has a past history of small bowel obstruction.  Her surgeon is Dr. Abbey Chatters. [name] is seen back today for a one week check. She is seen for Dr. Patty Sermons. She has multiple medical issues which are as outlined below. These include PAF, ischemic colitis, depression, HTN, and tobacco abuse. She has a normal EF per echo from earlier this year.  In September 2013 she was admitted at [facility] with another attack of ischemic colitis. She ended up having colon surgery with a colostomy placed. She had PAF while hospitalized. Also with MRSA from a PICC line. She was quite volume overloaded. We put her on Lasix when she returned to [place].  Since her last visit she has been feeling better.  She is stronger.  She is now taking Lasix only when necessary.  She has been hypokalemic and is on potassium 20 mEq every day.   Current Outpatient Prescriptions  Medication Sig Dispense Refill  . aspirin 325 MG EC tablet Take 325 mg by mouth daily.      . benazepril (LOTENSIN) 20 MG tablet Take 20 mg by mouth daily.      . bisoprolol (ZEBETA) 5 MG tablet Take 5  mg by mouth daily before breakfast.      . buPROPion (WELLBUTRIN XL) 300 MG 24 hr tablet Take 300 mg by mouth daily before breakfast.       . Calcium Carbonate-Vitamin D (CALCIUM + D PO) Take 500 mg by mouth daily.        . cyanocobalamin (,VITAMIN B-12,) 1000 MCG/ML injection Inject 1 mL (1,000 mcg total) into the muscle every 30 (thirty) days.  6 mL  1  . cyclobenzaprine (FLEXERIL) 10 MG tablet Take 10 mg by mouth as needed. Muscle spasms      . esomeprazole (NEXIUM) 40 MG capsule Take 40 mg by mouth 2 (two) times daily as needed.       Marland Kitchen estradiol (ESTRACE) 2 MG tablet Take 2 mg by mouth daily before breakfast.       . furosemide (LASIX) 40 MG tablet Take 40 mg by mouth as directed. 1/2 daily      . lansoprazole (PREVACID) 15 MG capsule Take 15 mg by mouth daily.      . montelukast (SINGULAIR) 10 MG tablet Take 10 mg by mouth as needed. For breathing nasal drainage congestion relief      . potassium chloride SA (K-DUR,KLOR-CON) 20 MEQ tablet Take 1 tablet (20 mEq total) by mouth daily.  30 tablet  3    Allergies  Allergen Reactions  . Codeine Nausea And Vomiting  . Tetanus Toxoids Other (See Comments)    serum  .  Xarelto (Rivaroxaban)     Hives; rash  . Penicillins Hives and Rash  . Vancomycin Rash    Patient Active Problem List  Diagnosis  . 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 Pure hypercholesterolemia  . Anemia  . Diarrhea  . Dehydration  . Chronic back pain  . Atrial fibrillation with rapid ventricular response  . Partial small bowel obstruction  . Atrial fibrillation  . Chronic ischemic colitis  . Hypotension  . Osteoarthritis of hip  . Postoperative anemia due to acute blood loss  . Hyponatremia  . WBC disease  . Ileus  . CMML (chronic myelomonocytic leukemia)    History  Smoking status  . Current Some Day Smoker -- 0.2 packs/day for 20 years  Smokeless tobacco  . Never Used    History  Alcohol Use  . Yes    1 - 2 glasses of wine  per week    Family History  Problem Relation Age of Onset  . Stroke Father   . Atrial fibrillation Father   . Hypertension Mother     Review of Systems: Constitutional: no fever chills diaphoresis or fatigue or change in weight.  Head and neck: no hearing loss, no epistaxis, no photophobia or visual disturbance. Respiratory: No cough, shortness of breath or wheezing. Cardiovascular: No chest pain peripheral edema, palpitations. Gastrointestinal: No abdominal distention, no abdominal pain, no change in bowel habits hematochezia or melena. Genitourinary: No dysuria, no frequency, no urgency, no nocturia. Musculoskeletal:No arthralgias, no back pain, no gait disturbance or myalgias. Neurological: No dizziness, no headaches, no numbness, no seizures, no syncope, no weakness, no tremors. Hematologic: No lymphadenopathy, no easy bruising. Psychiatric: No confusion, no hallucinations, no sleep disturbance.    Physical Exam: Filed Vitals:   01/30/12 1058  BP: 124/82  Pulse: 82   the general appearance reveals a tanned middle-aged woman in no acute distress.Pupils equal and reactive.   Extraocular Movements are full.  There is no scleral icterus.  The mouth and pharynx are normal.  The neck is supple.  The carotids reveal no bruits.  The jugular venous pressure is normal.  The thyroid is not enlarged.  There is no lymphadenopathy.  The chest is clear to percussion and auscultation. There are a few rhonchi which clear with coughing . Expansion of the chest is symmetrical. The precordium is quiet.  The first heart sound is normal.  The second heart sound is physiologically split.  There is no murmur gallop rub or click.  There is no abnormal lift or heave.  Rhythm is regular in sinus rhythm clinically.  We did not do an EKG today. Abdomen reveals a right-sided colostomy and a left-sided mucous fistula. Extremities show marked acral cyanosis of the hands and feet.  She does have 1+ dorsalis  pedis pulses bilaterally.  There is no pitting edema today Strength is normal and symmetrical in all extremities.  There is no lateralizing weakness.  There are no sensory deficits.  The skin is cool and dusky.  There is no rash.        Assessment / Plan: Continue same medication.  Continue to use furosemide only when necessary for visible edema.  Recheck in one month in early November as scheduled and we will repeat a CBC and a basal metabolic panel then.  We will also get an EKG. She will be discussing further workup of her ischemic colitis with her gastroenterologist and her surgeon.  She has not had a arteriogram of her  intestinal circulation.

## 2012-01-30 NOTE — Patient Instructions (Addendum)
Your physician recommends that you continue on your current medications as directed. Please refer to the Current Medication list given to you today.  Keep your November appointment    

## 2012-01-30 NOTE — Assessment & Plan Note (Signed)
The patient has had 2 spells of severe sudden ischemic colitis, once in early 2013 and again in September 2013.  During her second episode of ischemic colitis she underwent a partial colectomy and she now has a colostomy as well as a mucous fistula.  He has an appointment to see her gastroenterologist Dr. Randa Evens today.  She has an appointment to see her surgeon Dr. Abbey Chatters next week.

## 2012-01-30 NOTE — Assessment & Plan Note (Signed)
Blood pressures remaining controlled on current therapy.  She is not presently on a calcium channel blocker.  In the past she tolerated nifedipine poorly because of severe headaches.  The patient has a past history of peripheral vasospasm with acral cyanosis.  She was a former cigarette smoker but no longer smokes.

## 2012-01-30 NOTE — Assessment & Plan Note (Signed)
The patient has had no further episodes of atrial fibrillation.  Her rhythm is regular today.  She is not on anticoagulation other than for aspirin 325 mg daily.

## 2012-01-31 ENCOUNTER — Encounter: Payer: Self-pay | Admitting: Oncology

## 2012-01-31 ENCOUNTER — Other Ambulatory Visit: Payer: Self-pay | Admitting: Oncology

## 2012-01-31 DIAGNOSIS — C9310 Chronic myelomonocytic leukemia not having achieved remission: Secondary | ICD-10-CM

## 2012-01-31 DIAGNOSIS — K559 Vascular disorder of intestine, unspecified: Secondary | ICD-10-CM

## 2012-01-31 HISTORY — DX: Vascular disorder of intestine, unspecified: K55.9

## 2012-01-31 NOTE — Progress Notes (Signed)
Received office notes from Dr. Carman Ching @ [facility]; forwarded to Dr. Gaylyn Rong.

## 2012-02-01 ENCOUNTER — Telehealth: Payer: Self-pay | Admitting: Oncology

## 2012-02-01 NOTE — Telephone Encounter (Signed)
s.w. pt and gv appt for Dec.

## 2012-02-07 ENCOUNTER — Encounter (INDEPENDENT_AMBULATORY_CARE_PROVIDER_SITE_OTHER): Payer: Self-pay | Admitting: General Surgery

## 2012-02-07 ENCOUNTER — Ambulatory Visit (INDEPENDENT_AMBULATORY_CARE_PROVIDER_SITE_OTHER): Payer: BC Managed Care – PPO | Admitting: General Surgery

## 2012-02-07 ENCOUNTER — Other Ambulatory Visit (INDEPENDENT_AMBULATORY_CARE_PROVIDER_SITE_OTHER): Payer: Self-pay | Admitting: General Surgery

## 2012-02-07 VITALS — BP 120/66 | HR 68 | Temp 97.6°F | Resp 20 | Ht 65.0 in | Wt 163.8 lb

## 2012-02-07 DIAGNOSIS — K559 Vascular disorder of intestine, unspecified: Secondary | ICD-10-CM

## 2012-02-07 NOTE — Patient Instructions (Signed)
We will call you with your CT results.

## 2012-02-07 NOTE — Progress Notes (Signed)
Patient ID: [name], female   DOB: Feb 18, 1951, 61 y.o.   MRN: [id number]  Chief Complaint  Patient presents with  . Other    Pt had emergency Sx out of [facility] for eschemic colitis    HPI [name] is a 61 y.o. female.   HPI  She is referred by Dr. Patty Sermons for colostomy closure.  She has a very complex history regarding this. In January of this year, she had an episode of right sided ischemic colitis that resolved with medical treatment. During that hospital stay, she had episodes of paroxysmal atrial fibrillation. She completely recovered from this episode of ischemic colitis but it was noted by way of colonoscopy that she had ischemic changes to the right colon and proximal half of the transverse colon and this was biopsy proven. The left colon and sigmoid colon and rectum appeared normal at that time. 6 weeks ago, in [facility],  She had an acute abdomen and hypotension and was taken to the operating room where she is found to have severe left-sided ischemic colitis with some necrosis of the left colon. She underwent a partial colectomy which included the distal half of the transverse colon all the way down to the mid sigmoid colon area. The right colon was edematous but was not ischemic. She had a right colostomy placed in the right upper quadrant and a mucous fistula placed in the left lower quadrant. Both of these were placed lateral to some abdominal wall mesh he had put in for a ventral incisional hernia repair. The etiology of her ischemic colitis is unclear. She is due to see Dr. Gaylyn Rong for evaluation of a hypercoagulable state. Her energy is slowly returning. Her mucous fistula swelling is decreasing. Her colostomy is working well.  Past Medical History  Diagnosis Date  . Hypertension     She has a past hx of essential  . 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 Inflammation of joint of knee     Since we  last last saw her she developed problems with an acute which required surgical drainage by her orthopedist Dr. Cleophas Dunker.  Marland Kitchen MRSA (methicillin resistant Staphylococcus aureus)     Knee surgery drainage was positive for MRSA and she was treated with 3 weeks of doxycycline successfully.  . Diarrhea     Mild  . Exogenous obesity   . Atrial flutter     during hospitalization, 04/2011- related to anemia & illness/stress   . Depression   . GERD (gastroesophageal reflux disease)     2 hosp.- ischemic colitis - residual of Norovirus, 05/2011- sm. bowel obstruction  . Headache     migraine headache on occas, less now than when she was younger   . Arthritis     L hip, back, neck   . History of blood transfusion     04/2011- /w ischemic colitis   . Anemia     will see hematology consult prior to surgery, recommended by Dr. Patty Sermons  . Pneumonia     04/2011- not hospitalized , pt. denies SOB, changes in chest, breathing  . 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 Anemia 12/15/2010  . 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  Past Surgical History  Procedure Date  . Knee surgery     I&D- 2008, post laceration   .  Colonoscopy 05/16/2011    Procedure: COLONOSCOPY;  Surgeon: Vertell Novak., MD;  Location: [facility];  Service: Endoscopy;  Laterality: N/A;  . Appendectomy 1962  . Abdominal hysterectomy 1988  . Small intestine surgery 1992, 1999  . Laparotomy and lysis of adhesions   . Total hip arthroplasty 10/25/2011    Procedure: TOTAL HIP ARTHROPLASTY;  Surgeon: Valeria Batman, MD;  Location: [facility] OR;  Service: Orthopedics;  Laterality: Left;    Family History  Problem Relation Age of Onset  . Stroke Father   . Atrial fibrillation Father   . Hypertension Mother     Social History History  Substance Use Topics  . Smoking status: Current Some Day Smoker -- 0.2 packs/day for 20 years  . Smokeless tobacco: Never Used    . Alcohol Use: Yes     1 - 2 glasses of wine per week    Allergies  Allergen Reactions  . Codeine Nausea And Vomiting  . Tetanus Toxoids Other (See Comments)    serum  . Xarelto (Rivaroxaban)     Hives; rash  . Penicillins Hives and Rash  . Vancomycin Rash    Current Outpatient Prescriptions  Medication Sig Dispense Refill  . aspirin 325 MG EC tablet Take 325 mg by mouth daily.      . benazepril (LOTENSIN) 20 MG tablet Take 20 mg by mouth daily.      . bisoprolol (ZEBETA) 5 MG tablet Take 5 mg by mouth daily before breakfast.      . buPROPion (WELLBUTRIN XL) 300 MG 24 hr tablet Take 300 mg by mouth daily before breakfast.       . Calcium Carbonate-Vitamin D (CALCIUM + D PO) Take 500 mg by mouth daily.        . cyanocobalamin (,VITAMIN B-12,) 1000 MCG/ML injection Inject 1 mL (1,000 mcg total) into the muscle every 30 (thirty) days.  6 mL  1  . cyclobenzaprine (FLEXERIL) 10 MG tablet Take 10 mg by mouth as needed. Muscle spasms      . esomeprazole (NEXIUM) 40 MG capsule Take 40 mg by mouth 2 (two) times daily as needed.       Marland Kitchen estradiol (ESTRACE) 2 MG tablet Take 2 mg by mouth daily before breakfast.       . furosemide (LASIX) 40 MG tablet Take 40 mg by mouth as directed. 1/2 daily      . lansoprazole (PREVACID) 15 MG capsule Take 15 mg by mouth daily.      . montelukast (SINGULAIR) 10 MG tablet Take 10 mg by mouth as needed. For breathing nasal drainage congestion relief      . potassium chloride SA (K-DUR,KLOR-CON) 20 MEQ tablet Take 1 tablet (20 mEq total) by mouth daily.  30 tablet  3    Review of Systems Review of Systems  Constitutional: Negative for fever and chills.  HENT: Negative.   Respiratory: Negative for chest tightness and shortness of breath.   Cardiovascular: Negative for chest pain.  Gastrointestinal: Negative for abdominal pain, blood in stool and abdominal distention.  Genitourinary: Negative for difficulty urinating.  Neurological: Negative for seizures.   Hematological: Does not bruise/bleed easily.       No history of DVT    Blood pressure 120/66, pulse 68, temperature 97.6 F (36.4 C), temperature source Temporal, resp. rate 20, height 5\' 5"  (1.651 m), weight 163 lb 12.8 oz (74.299 kg).  Physical Exam Physical Exam  Constitutional: She appears well-developed and well-nourished. No distress.  HENT:  Head: Normocephalic and atraumatic.  Eyes: No scleral icterus.  Neck: Neck supple.  Cardiovascular: Normal rate and regular rhythm.   Pulmonary/Chest: Effort normal and breath sounds normal.  Abdominal: Soft. She exhibits no distension and no mass. There is no tenderness.       Right upper quadrant colostomy with a small amount of prolapse. Left lower quadrant mucous fistula that is pink and viable. Midline wound that is clean and intact.  Musculoskeletal: Normal range of motion. She exhibits no edema.  Lymphadenopathy:    She has no cervical adenopathy.  Neurological: She is alert.  Skin: Skin is warm and dry.    Data Reviewed Notes from Dr. Randa Evens. Notes from outside hospital. Notes from Dr. Patty Sermons.  Assessment    Recurrent ischemic colitis, this time involving the left colon and requiring emergency partial colectomy with colostomy and mucous fistula.  The etiology of this is unclear. It could be secondary to a hypercoagulable state, vascular disease, or vasculitis. She is 6 weeks from the emergency surgery.    Plan    Workup of hypercoagulable state per hematology. Will order CT angiography of the abdominal vessels to look for any anatomic abnormalities. If those are present, she may need a formal angiography to further define this process. If the above evaluations are negative, we will need to have her evaluated for the possibility of vasculitis. Return visit with me in 6 weeks.       Abdifatah Colquhoun J 02/07/2012, 4:29 PM

## 2012-02-08 ENCOUNTER — Other Ambulatory Visit: Payer: BC Managed Care – PPO | Admitting: Lab

## 2012-02-08 ENCOUNTER — Ambulatory Visit: Payer: BC Managed Care – PPO | Admitting: Oncology

## 2012-02-08 ENCOUNTER — Telehealth: Payer: Self-pay | Admitting: Cardiology

## 2012-02-08 NOTE — Telephone Encounter (Signed)
Advised ok to get at cancer center and they are in epic system so should be able to see order

## 2012-02-08 NOTE — Telephone Encounter (Signed)
Pt would like to have labwork done at Cancer center during her appointment tomorrow.  plz send order to [facility] for patient.  If there is a problem with this request please call patient to advise.

## 2012-02-09 ENCOUNTER — Other Ambulatory Visit: Payer: BC Managed Care – PPO | Admitting: Lab

## 2012-02-09 ENCOUNTER — Other Ambulatory Visit (HOSPITAL_BASED_OUTPATIENT_CLINIC_OR_DEPARTMENT_OTHER): Payer: BC Managed Care – PPO | Admitting: Lab

## 2012-02-09 ENCOUNTER — Other Ambulatory Visit: Payer: Self-pay | Admitting: Oncology

## 2012-02-09 ENCOUNTER — Other Ambulatory Visit: Payer: BC Managed Care – PPO

## 2012-02-09 ENCOUNTER — Ambulatory Visit (HOSPITAL_BASED_OUTPATIENT_CLINIC_OR_DEPARTMENT_OTHER): Payer: BC Managed Care – PPO | Admitting: Oncology

## 2012-02-09 VITALS — BP 121/79 | HR 97 | Temp 97.0°F | Resp 20 | Ht 65.0 in | Wt 166.7 lb

## 2012-02-09 DIAGNOSIS — K559 Vascular disorder of intestine, unspecified: Secondary | ICD-10-CM

## 2012-02-09 DIAGNOSIS — C9310 Chronic myelomonocytic leukemia not having achieved remission: Secondary | ICD-10-CM

## 2012-02-09 DIAGNOSIS — C9210 Chronic myeloid leukemia, BCR/ABL-positive, not having achieved remission: Secondary | ICD-10-CM

## 2012-02-09 DIAGNOSIS — E538 Deficiency of other specified B group vitamins: Secondary | ICD-10-CM

## 2012-02-09 DIAGNOSIS — D649 Anemia, unspecified: Secondary | ICD-10-CM

## 2012-02-09 LAB — COMPREHENSIVE METABOLIC PANEL (CC13)
Albumin: 3.8 g/dL (ref 3.5–5.0)
Alkaline Phosphatase: 712 U/L — ABNORMAL HIGH (ref 40–150)
BUN: 21 mg/dL (ref 7.0–26.0)
CO2: 18 mEq/L — ABNORMAL LOW (ref 22–29)
Glucose: 79 mg/dl (ref 70–99)
Potassium: 4.6 mEq/L (ref 3.5–5.1)
Total Protein: 7 g/dL (ref 6.4–8.3)

## 2012-02-09 LAB — CBC WITH DIFFERENTIAL/PLATELET
Basophils Absolute: 0 10*3/uL (ref 0.0–0.1)
Eosinophils Absolute: 0.1 10*3/uL (ref 0.0–0.5)
HGB: 9.6 g/dL — ABNORMAL LOW (ref 11.6–15.9)
LYMPH%: 23.9 % (ref 14.0–49.7)
MCV: 98 fL (ref 79.5–101.0)
MONO#: 1 10*3/uL — ABNORMAL HIGH (ref 0.1–0.9)
MONO%: 19.8 % — ABNORMAL HIGH (ref 0.0–14.0)
NEUT#: 2.6 10*3/uL (ref 1.5–6.5)
Platelets: 378 10*3/uL (ref 145–400)
RDW: 19 % — ABNORMAL HIGH (ref 11.2–14.5)
WBC: 4.9 10*3/uL (ref 3.9–10.3)

## 2012-02-09 NOTE — Patient Instructions (Addendum)
1.  Issue:  Recurrent ischemic colitis. 2.  Question:  Any linkage between recurrent ischemic colitis and any hematological condition?  *  CMML:  Not know to causes recurrent clot.  Other myeloprolierative conditions do such as polycythemia vera and essential thrombocytosis.  *  I need to rule out PNH (paroxysmal nocturnal hemoglobinuria) and hypercoagulable state.  3.  Treatment:  If these work up are negative, need to follow up with Surgery and angiogram to see if there is any vascular anomaly. 4.  For CMML; again watchful observation.  Your anemia is stable.  If it continues to drop to <7, we may consider Aranesp.  However, Aranesp does slightly increase the risk of clot.

## 2012-02-09 NOTE — Progress Notes (Signed)
[facility]  [phone] [phone]   OFFICE PROGRESS NOTE   Cc:  Kari Clement, MD  DIAGNOSIS AND CURRENT THERAPY:  1. chronic myelomonocytic leukemia (low risk: With solitary anemia, normal cytogenetics and normal blast count).  She is on observation for this.  2. Vitamin B12 deficiency:  Vitamin B12 injection.   INTERVAL HISTORY: [name] 61 y.o. female returns for regular follow up with her sister [name].  She had another episode of ischemic colitis during Labor day weekend 6 weeks ago when she was traveling in [place].  She had ex lap, resection of distal transverse colon; entire descending and sigmoid colon with creation of an end transverse colostomy and mucous fistula in the left lower quadrant from the rectal stump.  She had sepsis, hypotensive, was in ICU for about 1 week.  She was since discharged; and has been home. She required 2 units of RBC during the hospital course.  She was kindly referred back to the Cancer Center to see if there are hematological explanation for her recurrent colitis.   [name] presented to clinic to day with her sister.  She reported that she has fatigue.  She is independent of personal hygiene activities.  However, she needs help with household chores.  She has much less edema now with diuresis.  She has not had abdominal pain.  She denied any melena, hematochezia. She has had decreased appetite and about 10-15 lb weight loss compared to earlier this year.  She does have bluish tips of fingers and toes in cold weather. She still smokes cigarettes about 4-5 a day.  She is trying to quit.   Patient denies fever, headache, visual changes, confusion, drenching night sweats, palpable lymph node swelling, mucositis, odynophagia, dysphagia, nausea vomiting, jaundice, chest pain, palpitation, shortness of breath, dyspnea on exertion, productive cough, gum bleeding, epistaxis, hematemesis, hemoptysis, abdominal pain,  abdominal swelling, early satiety, melena, hematochezia, hematuria, spontaneous bleeding, joint swelling, joint pain, heat or cold intolerance, bowel bladder incontinence, back pain, focal motor weakness, paresthesia, depression, suicidal or homicidal ideation, feeling hopelessness.       Past Medical History  Diagnosis Date  . Hypertension     She has a past hx of essential  . 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 Inflammation of joint of knee     Since we last last saw her she developed problems with an acute which required surgical drainage by her orthopedist Dr. Cleophas Dunker.  Marland Kitchen MRSA (methicillin resistant Staphylococcus aureus)     Knee surgery drainage was positive for MRSA and she was treated with 3 weeks of doxycycline successfully.  . Diarrhea     Mild  . Exogenous obesity   . Atrial flutter     during hospitalization, 04/2011- related to anemia & illness/stress   . Depression   . GERD (gastroesophageal reflux disease)     2 hosp.- ischemic colitis - residual of Norovirus, 05/2011- sm. bowel obstruction  . Headache     migraine headache on occas, less now than when she was younger   . Arthritis     L hip, back, neck   . History of blood transfusion     04/2011- /w ischemic colitis   . Anemia     will see hematology consult prior to surgery, recommended by Dr. Patty Sermons  . Pneumonia     04/2011- not  hospitalized , pt. denies SOB, changes in chest, breathing  . 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 Anemia 12/15/2010  . Ischemic colitis 01/31/2012    Past Surgical History  Procedure Date  . Knee surgery     I&D- 2008, post laceration   . Colonoscopy 05/16/2011    Procedure: COLONOSCOPY;  Surgeon: Vertell Novak., MD;  Location: [facility];  Service: Endoscopy;  Laterality: N/A;  . Appendectomy 1962  . Abdominal hysterectomy 1988  . Small intestine surgery 1992, 1999  .  Laparotomy and lysis of adhesions   . Total hip arthroplasty 10/25/2011    Procedure: TOTAL HIP ARTHROPLASTY;  Surgeon: Valeria Batman, MD;  Location: [facility] OR;  Service: Orthopedics;  Laterality: Left;    Current Outpatient Prescriptions  Medication Sig Dispense Refill  . aspirin 325 MG EC tablet Take 325 mg by mouth daily.      . benazepril (LOTENSIN) 20 MG tablet Take 20 mg by mouth daily.      . bisoprolol (ZEBETA) 5 MG tablet Take 5 mg by mouth daily before breakfast.      . buPROPion (WELLBUTRIN XL) 300 MG 24 hr tablet Take 300 mg by mouth daily before breakfast.       . Calcium Carbonate-Vitamin D (CALCIUM + D PO) Take 500 mg by mouth daily.        . cyanocobalamin (,VITAMIN B-12,) 1000 MCG/ML injection Inject 1 mL (1,000 mcg total) into the muscle every 30 (thirty) days.  6 mL  1  . esomeprazole (NEXIUM) 40 MG capsule Take 40 mg by mouth 2 (two) times daily as needed.       Marland Kitchen estradiol (ESTRACE) 2 MG tablet Take 2 mg by mouth daily before breakfast.       . furosemide (LASIX) 40 MG tablet Take 40 mg by mouth as directed. 1/2 daily as needed      . lansoprazole (PREVACID) 15 MG capsule Take 15 mg by mouth daily.      . montelukast (SINGULAIR) 10 MG tablet Take 10 mg by mouth as needed. For breathing nasal drainage congestion relief      . potassium chloride SA (K-DUR,KLOR-CON) 20 MEQ tablet Take 20 mEq by mouth daily. As needed w/ lasix      . DISCONTD: potassium chloride SA (K-DUR,KLOR-CON) 20 MEQ tablet Take 1 tablet (20 mEq total) by mouth daily.  30 tablet  3  . cyclobenzaprine (FLEXERIL) 10 MG tablet Take 10 mg by mouth as needed. Muscle spasms        ALLERGIES:  is allergic to codeine; tetanus toxoids; xarelto; penicillins; and vancomycin.  REVIEW OF SYSTEMS:  The rest of the 14-point review of system was negative.   Filed Vitals:   02/09/12 1357  BP: 121/79  Pulse: 97  Temp: 97 F (36.1 C)  Resp: 20   Wt Readings from Last 3 Encounters:  02/09/12 166 lb 11.2 oz (75.615  kg)  02/07/12 163 lb 12.8 oz (74.299 kg)  01/30/12 165 lb (74.844 kg)   ECOG Performance status: 1-2  PHYSICAL EXAMINATION:   General:  well-nourished woman, in no acute distress.  Eyes:  no scleral icterus.  ENT:  There were no oropharyngeal lesions.  Neck was without thyromegaly.  Lymphatics:  Negative cervical, supraclavicular or axillary adenopathy.  Respiratory: lungs were clear bilaterally without wheezing or crackles.  Cardiovascular:  Regular rate and rhythm, S1/S2, without murmur, rub or gallop.  There was no pedal edema.  GI:  abdomen was soft,  flat, nontender, nondistended, without organomegaly.  Colostomy in place with viable tissue. There was a mucous fistula in the LLQ with also viable tissue.  Muscoloskeletal:  no spinal tenderness of palpation of vertebral spine.  Skin exam was without echymosis, petichae.  Neuro exam was nonfocal.  Patient was able to get on and off exam table without assistance.  Gait was normal.  Patient was alerted and oriented.  Attention was good.   Language was appropriate.  Mood was normal without depression.  Speech was not pressured.  Thought content was not tangential.      LABORATORY/RADIOLOGY DATA:  Lab Results  Component Value Date   WBC 4.9 02/09/2012   HGB 9.6* 02/09/2012   HCT 28.1* 02/09/2012   PLT 378 02/09/2012   GLUCOSE 79 02/09/2012   CHOL 216* 12/15/2010   TRIG 68.0 12/15/2010   HDL 92.50 12/15/2010   LDLDIRECT 105.6 12/15/2010   LDLCALC  Value: 115        Total Cholesterol/HDL:CHD Risk Coronary Heart Disease Risk Table                     Men   Women  1/2 Average Risk   3.4   3.3* 07/20/2007   ALKPHOS 712* 02/09/2012   ALT 56* 02/09/2012   AST 53* 02/09/2012   NA 130* 02/09/2012   K 4.6 02/09/2012   CL 101 02/09/2012   CREATININE 0.8 02/09/2012   BUN 21.0 02/09/2012   CO2 18* 02/09/2012   INR 1.12 10/14/2011     ASSESSMENT AND PLAN:    1.  Recurrent ischemic colitis:   *  From hematology standpoint:  CMML:  Not know to  causes recurrent clot.  Other myeloprolierative conditions do such as polycythemia vera and essential thrombocytosis. She does not have PV or ET. I need to rule out PNH (paroxysmal nocturnal hemoglobinuria) and hypercoagulable state as potential causes.   *  Other causes:  Smoking, vasospasm, vascular anomaly, rheumatology?   -Treatment:  If these work up are negative, need to follow up with Surgery and angiogram to see if there is any vascular anomaly.  2. CMML (chronic myelomonocytic leukemia). You also have mild VitB12 deficiency.  -Low risk (since she only has anemia. She does not have abnormal cytogenetics. She does not have pancytopenia or increased blast.  - Consider starting Aranesp injection in the future if her Hgb drops to <7-8with goal to improve Hgb and decrease need for frequent blood transfusion which can result in iron overload. In the future, if she develops pancytopenia, we may consider repeating bone marrow biopsy to ensure that your CMML has not progressed to higher risk category which may benefit from chemo (Vidaza injection). There is a slight potential risk of Aranesp which is slight increase risk of stroke and heart attack. Therefore, I do not advocate using this now until her Hgb <7-8.  3. VitB12 deficiency: She is on monthly IM injection of Vit B12.   4. Smoking: I strongly encouraged her to stop smoking.  5. Depression: Mood was well controlled on Wellbutrin.   6. Follow up: in Jan 2014 but sooner if the above hematology works up come back positive    The length of time of the face-to-face encounter was 25 minutes. More than 50% of time was spent counseling and coordination of care.

## 2012-02-10 ENCOUNTER — Ambulatory Visit (HOSPITAL_COMMUNITY): Payer: BC Managed Care – PPO

## 2012-02-10 LAB — HYPERCOAGULABLE PANEL, COMPREHENSIVE
AntiThromb III Func: 127 % — ABNORMAL HIGH (ref 76–126)
Anticardiolipin IgG: 9 GPL U/mL (ref <23)
Beta-2 Glyco I IgG: 0 G Units (ref <20)
Beta-2-Glycoprotein I IgA: 17 A Units (ref <20)
Beta-2-Glycoprotein I IgM: 40 M Units — ABNORMAL HIGH (ref <20)
Drvvt confirmation: 1.35 Ratio — ABNORMAL HIGH (ref <1.11)
Lupus Anticoagulant: DETECTED — AB
Protein C, Total: 76 % (ref 72–160)
Protein S Total: 82 % (ref 60–150)

## 2012-02-13 LAB — PNH PROFILE (-HIGH SENSITIVITY)

## 2012-02-14 ENCOUNTER — Encounter: Payer: Self-pay | Admitting: Oncology

## 2012-02-14 ENCOUNTER — Other Ambulatory Visit: Payer: Self-pay | Admitting: Oncology

## 2012-02-14 ENCOUNTER — Telehealth (INDEPENDENT_AMBULATORY_CARE_PROVIDER_SITE_OTHER): Payer: Self-pay

## 2012-02-14 NOTE — Telephone Encounter (Signed)
Called [facility] and verified that the patient was a no show for her CT angio scheduled for 02/10/12.  At this time, she has not rescheduled.

## 2012-02-15 ENCOUNTER — Telehealth (INDEPENDENT_AMBULATORY_CARE_PROVIDER_SITE_OTHER): Payer: Self-pay

## 2012-02-16 LAB — HEXAGONAL PHOSPHOLIPID NEUTRALIZATION: Hex Phosph Neut Test: NEGATIVE

## 2012-02-16 NOTE — Telephone Encounter (Signed)
Close encounter 

## 2012-02-17 ENCOUNTER — Encounter (HOSPITAL_COMMUNITY): Payer: Self-pay | Admitting: *Deleted

## 2012-02-17 ENCOUNTER — Emergency Department (HOSPITAL_COMMUNITY)
Admission: EM | Admit: 2012-02-17 | Discharge: 2012-02-17 | Disposition: A | Discharge status: Home / Self Care | Payer: BC Managed Care – PPO | Attending: Emergency Medicine | Admitting: Emergency Medicine

## 2012-02-17 ENCOUNTER — Emergency Department (HOSPITAL_COMMUNITY): Payer: BC Managed Care – PPO

## 2012-02-17 DIAGNOSIS — M129 Arthropathy, unspecified: Secondary | ICD-10-CM | POA: Insufficient documentation

## 2012-02-17 DIAGNOSIS — Z8701 Personal history of pneumonia (recurrent): Secondary | ICD-10-CM | POA: Insufficient documentation

## 2012-02-17 DIAGNOSIS — I10 Essential (primary) hypertension: Secondary | ICD-10-CM | POA: Insufficient documentation

## 2012-02-17 DIAGNOSIS — IMO0002 Reserved for concepts with insufficient information to code with codable children: Secondary | ICD-10-CM

## 2012-02-17 DIAGNOSIS — R7989 Other specified abnormal findings of blood chemistry: Secondary | ICD-10-CM | POA: Insufficient documentation

## 2012-02-17 DIAGNOSIS — R51 Headache: Secondary | ICD-10-CM | POA: Insufficient documentation

## 2012-02-17 DIAGNOSIS — I4892 Unspecified atrial flutter: Secondary | ICD-10-CM | POA: Insufficient documentation

## 2012-02-17 DIAGNOSIS — Z9889 Other specified postprocedural states: Secondary | ICD-10-CM | POA: Insufficient documentation

## 2012-02-17 DIAGNOSIS — F3289 Other specified depressive episodes: Secondary | ICD-10-CM | POA: Insufficient documentation

## 2012-02-17 DIAGNOSIS — R112 Nausea with vomiting, unspecified: Secondary | ICD-10-CM | POA: Insufficient documentation

## 2012-02-17 DIAGNOSIS — K219 Gastro-esophageal reflux disease without esophagitis: Secondary | ICD-10-CM | POA: Insufficient documentation

## 2012-02-17 DIAGNOSIS — F17200 Nicotine dependence, unspecified, uncomplicated: Secondary | ICD-10-CM | POA: Insufficient documentation

## 2012-02-17 DIAGNOSIS — R109 Unspecified abdominal pain: Secondary | ICD-10-CM | POA: Insufficient documentation

## 2012-02-17 DIAGNOSIS — Z8614 Personal history of Methicillin resistant Staphylococcus aureus infection: Secondary | ICD-10-CM | POA: Insufficient documentation

## 2012-02-17 DIAGNOSIS — D649 Anemia, unspecified: Secondary | ICD-10-CM | POA: Insufficient documentation

## 2012-02-17 DIAGNOSIS — Z79899 Other long term (current) drug therapy: Secondary | ICD-10-CM | POA: Insufficient documentation

## 2012-02-17 DIAGNOSIS — Z7982 Long term (current) use of aspirin: Secondary | ICD-10-CM | POA: Insufficient documentation

## 2012-02-17 DIAGNOSIS — E669 Obesity, unspecified: Secondary | ICD-10-CM | POA: Insufficient documentation

## 2012-02-17 DIAGNOSIS — F329 Major depressive disorder, single episode, unspecified: Secondary | ICD-10-CM | POA: Insufficient documentation

## 2012-02-17 DIAGNOSIS — R197 Diarrhea, unspecified: Secondary | ICD-10-CM | POA: Insufficient documentation

## 2012-02-17 LAB — CBC WITH DIFFERENTIAL/PLATELET
Basophils Absolute: 0 10*3/uL (ref 0.0–0.1)
Basophils Relative: 0 % (ref 0–1)
Eosinophils Absolute: 0.1 10*3/uL (ref 0.0–0.7)
Hemoglobin: 10 g/dL — ABNORMAL LOW (ref 12.0–15.0)
Lymphocytes Relative: 18 % (ref 12–46)
MCH: 32.5 pg (ref 26.0–34.0)
MCHC: 34.7 g/dL (ref 30.0–36.0)
Monocytes Absolute: 1.2 10*3/uL — ABNORMAL HIGH (ref 0.1–1.0)
Neutro Abs: 3.7 10*3/uL (ref 1.7–7.7)
Neutrophils Relative %: 62 % (ref 43–77)
RDW: 17.2 % — ABNORMAL HIGH (ref 11.5–15.5)

## 2012-02-17 LAB — COMPREHENSIVE METABOLIC PANEL
CO2: 19 mEq/L (ref 19–32)
Calcium: 8.9 mg/dL (ref 8.4–10.5)
Creatinine, Ser: 1.07 mg/dL (ref 0.50–1.10)
GFR calc Af Amer: 63 mL/min — ABNORMAL LOW (ref >90)
GFR calc non Af Amer: 54 mL/min — ABNORMAL LOW (ref >90)
Glucose, Bld: 100 mg/dL — ABNORMAL HIGH (ref 70–99)
Total Protein: 7.1 g/dL (ref 6.0–8.3)

## 2012-02-17 MED ORDER — ONDANSETRON HCL 4 MG/2ML IJ SOLN
4.0000 mg | Freq: Once | INTRAMUSCULAR | Status: AC
  Administered 2012-02-17: 4 mg via INTRAVENOUS
  Filled 2012-02-17: qty 2

## 2012-02-17 MED ORDER — PROMETHAZINE HCL 25 MG/ML IJ SOLN
12.5000 mg | Freq: Once | INTRAMUSCULAR | Status: AC
  Administered 2012-02-17: 12.5 mg via INTRAVENOUS
  Filled 2012-02-17: qty 1

## 2012-02-17 MED ORDER — HYDROMORPHONE HCL PF 1 MG/ML IJ SOLN
1.0000 mg | Freq: Once | INTRAMUSCULAR | Status: AC
  Administered 2012-02-17: 1 mg via INTRAVENOUS
  Filled 2012-02-17: qty 1

## 2012-02-17 MED ORDER — IOHEXOL 350 MG/ML SOLN
100.0000 mL | Freq: Once | INTRAVENOUS | Status: AC | PRN
  Administered 2012-02-17: 100 mL via INTRAVENOUS

## 2012-02-17 MED ORDER — SODIUM CHLORIDE 0.9 % IV SOLN
Freq: Once | INTRAVENOUS | Status: AC
  Administered 2012-02-17: 15:00:00 via INTRAVENOUS

## 2012-02-17 MED ORDER — HYDROCODONE-ACETAMINOPHEN 5-325 MG PO TABS: 1.0000 | ORAL_TABLET | ORAL | Status: DC | PRN

## 2012-02-17 MED ORDER — ONDANSETRON HCL 4 MG/2ML IJ SOLN
4.0000 mg | Freq: Once | INTRAMUSCULAR | Status: AC
  Filled 2012-02-17 (×2): qty 2

## 2012-02-17 MED ORDER — ONDANSETRON HCL 4 MG/2ML IJ SOLN
4.0000 mg | Freq: Once | INTRAMUSCULAR | Status: AC
  Administered 2012-02-17: 4 mg via INTRAVENOUS

## 2012-02-17 MED ORDER — PROMETHAZINE HCL 25 MG PO TABS: 25.0000 mg | ORAL_TABLET | Freq: Four times a day (QID) | ORAL | Status: DC | PRN

## 2012-02-17 NOTE — ED Notes (Addendum)
Patient tolerating fluids. D/C instructions reviewed. Rx given x2.  All questions answered.

## 2012-02-17 NOTE — ED Notes (Signed)
Went to collect labs - pt requests obtaining labs when IV is placed.

## 2012-02-17 NOTE — ED Provider Notes (Signed)
History     CSN: [id number]  Arrival date & time 02/17/12  1252   First MD Initiated Contact with Patient 02/17/12 1346      Chief Complaint  Patient presents with  . Abdominal Pain    (Consider location/radiation/quality/duration/timing/severity/associated sxs/prior treatment) Patient is a 61 y.o. female presenting with abdominal pain.  Abdominal Pain The primary symptoms of the illness include abdominal pain, nausea, vomiting and diarrhea. The primary symptoms of the illness do not include fever, shortness of breath, hematemesis, hematochezia or dysuria. The current episode started 2 days ago.  Associated symptoms comments: She has a history significant for previous multiple SBO requiring surgical intervention and recently diagnosed with ischemic bowel with colectomy and colostomy. In the last two days she is having increased abdominal cramping, loose stool, nausea with onset of vomiting today. No fever. No blood in bowel movements or emesis. .    Past Medical History  Diagnosis Date  . Hypertension     She has a past hx of essential  . 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 Inflammation of joint of knee     Since we last last saw her she developed problems with an acute which required surgical drainage by her orthopedist Dr. Cleophas Dunker.  Marland Kitchen MRSA (methicillin resistant Staphylococcus aureus)     Knee surgery drainage was positive for MRSA and she was treated with 3 weeks of doxycycline successfully.  . Diarrhea     Mild  . Exogenous obesity   . Atrial flutter     during hospitalization, 04/2011- related to anemia & illness/stress   . Depression   . GERD (gastroesophageal reflux disease)     2 hosp.- ischemic colitis - residual of Norovirus, 05/2011- sm. bowel obstruction  . Headache     migraine headache on occas, less now than when she was younger   . Arthritis     L hip, back, neck   . History of  blood transfusion     04/2011- /w ischemic colitis   . Anemia     will see hematology consult prior to surgery, recommended by Dr. Patty Sermons  . Pneumonia     04/2011- not hospitalized , pt. denies SOB, changes in chest, breathing  . 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 Anemia 12/15/2010  . Ischemic colitis 01/31/2012    Past Surgical History  Procedure Date  . Knee surgery     I&D- 2008, post laceration   . Colonoscopy 05/16/2011    Procedure: COLONOSCOPY;  Surgeon: Vertell Novak., MD;  Location: [facility];  Service: Endoscopy;  Laterality: N/A;  . Appendectomy 1962  . Abdominal hysterectomy 1988  . Small intestine surgery 1992, 1999  . Laparotomy and lysis of adhesions   . Total hip arthroplasty 10/25/2011    Procedure: TOTAL HIP ARTHROPLASTY;  Surgeon: Valeria Batman, MD;  Location: [facility] OR;  Service: Orthopedics;  Laterality: Left;    Family History  Problem Relation Age of Onset  . Stroke Father   . Atrial fibrillation Father   . Hypertension Mother     History  Substance Use Topics  . Smoking status: Current Some Day Smoker -- 0.2 packs/day for 20 years  . Smokeless tobacco: Never Used  . Alcohol Use: Yes     1 - 2 glasses of wine per week    OB History    Grav Para Term Preterm  Abortions TAB SAB Ect Mult Living                  Review of Systems  Constitutional: Negative for fever.  Respiratory: Negative for shortness of breath.   Cardiovascular: Negative for chest pain.  Gastrointestinal: Positive for nausea, vomiting, abdominal pain and diarrhea. Negative for blood in stool, hematochezia and hematemesis.  Genitourinary: Negative for dysuria.    Allergies  Vancomycin; Codeine; Tetanus toxoids; Xarelto; and Penicillins  Home Medications   Current Outpatient Rx  Name Route Sig Dispense Refill  . ASPIRIN 325 MG PO TBEC Oral Take 325 mg by mouth daily.    Marland Kitchen BENAZEPRIL HCL 20 MG PO TABS Oral Take 20 mg by mouth daily.    Marland Kitchen BISOPROLOL  FUMARATE 5 MG PO TABS Oral Take 5 mg by mouth daily before breakfast.    . BUPROPION HCL ER (XL) 300 MG PO TB24 Oral Take 300 mg by mouth daily before breakfast.     . CYCLOBENZAPRINE HCL 10 MG PO TABS Oral Take 10 mg by mouth as needed. Muscle spasms    . ESOMEPRAZOLE MAGNESIUM 40 MG PO CPDR Oral Take 40 mg by mouth daily.     Marland Kitchen ESTRADIOL 2 MG PO TABS Oral Take 2 mg by mouth daily before breakfast.     . FUROSEMIDE 40 MG PO TABS Oral Take 40 mg by mouth as directed. 1/2 daily as needed    . LANSOPRAZOLE 15 MG PO CPDR Oral Take 15 mg by mouth daily.    Marland Kitchen MONTELUKAST SODIUM 10 MG PO TABS Oral Take 10 mg by mouth as needed. For breathing nasal drainage congestion relief    . POTASSIUM CHLORIDE CRYS ER 20 MEQ PO TBCR Oral Take 20 mEq by mouth daily. As needed w/ lasix    . CYANOCOBALAMIN 1000 MCG/ML IJ SOLN Intramuscular Inject 1 mL (1,000 mcg total) into the muscle every 30 (thirty) days. 6 mL 1    BP 125/71  Pulse 67  Temp 97.8 F (36.6 C) (Oral)  Resp 16  SpO2 91%  Physical Exam  Constitutional: She is oriented to person, place, and time. She appears well-developed and well-nourished.  HENT:  Head: Normocephalic.  Neck: Normal range of motion. Neck supple.  Cardiovascular: Normal rate and regular rhythm.   Pulmonary/Chest: Effort normal and breath sounds normal.  Abdominal: Soft. Bowel sounds are normal. There is tenderness. There is no rebound and no guarding.       Multiple surgical incisional scars throughout abdomen. Right colostomy with large stoma that appears unremarkable. Loose, nonbloody stool in colostomy bag. Generalized tenderness to abdomen.   Musculoskeletal: Normal range of motion.  Neurological: She is alert and oriented to person, place, and time.  Skin: Skin is warm and dry. No rash noted.  Psychiatric: She has a normal mood and affect.    ED Course  Procedures (including critical care time)   Labs Reviewed  CBC WITH DIFFERENTIAL  COMPREHENSIVE METABOLIC  PANEL   No results found.   No diagnosis found.  1. Abdominal pain 2. N, V, D  MDM  Pain and vomiting controlled with medications. CT angio of abd/pel negative for ischemia or other abnormalities. Labs are stable and patient has been afebrile. Patient has strong physician support in place outpatient and can reasonably be discharged home with supportive medications.        Rodena Medin, PA-C 02/17/12 [phone]

## 2012-02-17 NOTE — ED Notes (Signed)
Patient denies nausea at this time. Discharge instructions reviewed.

## 2012-02-17 NOTE — ED Notes (Signed)
Pt states "I had a colostomy for a little while after a colectomy, Dr. Randa Evens is my doctor, I had diarrhea yesterday, have taken zofran but hasn't helped, my abdomen feels crampy."

## 2012-02-18 NOTE — ED Provider Notes (Signed)
Medical screening examination/treatment/procedure(s) were performed by non-physician practitioner and as supervising physician I was immediately available for consultation/collaboration.   Loren Racer, MD 02/18/12 [phone]

## 2012-02-20 ENCOUNTER — Other Ambulatory Visit (INDEPENDENT_AMBULATORY_CARE_PROVIDER_SITE_OTHER): Payer: Self-pay | Admitting: General Surgery

## 2012-02-20 ENCOUNTER — Encounter (INDEPENDENT_AMBULATORY_CARE_PROVIDER_SITE_OTHER): Payer: Self-pay | Admitting: General Surgery

## 2012-02-20 DIAGNOSIS — I776 Arteritis, unspecified: Secondary | ICD-10-CM

## 2012-02-20 NOTE — Progress Notes (Signed)
Patient ID: [name], female   DOB: Aug 13, 1950, 61 y.o.   MRN: [id number] Her CT angiogram does not show any significant vascular disease. Her lupus anticoagulant is detectable but she's in the recent postop period. Dr. Gaylyn Rong feels this needs to be repeated in 3 months to verify its accuracy.  I feel that she could have vasculitis.  I spoke with Dr. Dierdre Forth from Rheumatology and he is willing to see her and evaluate her.  I have discussed this with Ms. Borra and she agrees with the plan.

## 2012-02-21 ENCOUNTER — Encounter: Payer: Self-pay | Admitting: Cardiology

## 2012-02-21 ENCOUNTER — Ambulatory Visit (INDEPENDENT_AMBULATORY_CARE_PROVIDER_SITE_OTHER): Payer: BC Managed Care – PPO | Admitting: Cardiology

## 2012-02-21 ENCOUNTER — Encounter: Payer: Self-pay | Admitting: Dietician

## 2012-02-21 VITALS — BP 144/93 | HR 74 | Resp 18 | Ht 66.0 in | Wt 163.0 lb

## 2012-02-21 DIAGNOSIS — I4891 Unspecified atrial fibrillation: Secondary | ICD-10-CM

## 2012-02-21 DIAGNOSIS — I119 Hypertensive heart disease without heart failure: Secondary | ICD-10-CM

## 2012-02-21 DIAGNOSIS — E7800 Pure hypercholesterolemia, unspecified: Secondary | ICD-10-CM

## 2012-02-21 NOTE — Assessment & Plan Note (Signed)
Blood pressure has been borderline to slightly high which she attributes to recent stress.  She may need to have a total colectomy

## 2012-02-21 NOTE — Patient Instructions (Addendum)
Your physician recommends that you continue on your current medications as directed. Please refer to the Current Medication list given to you today.  Your physician recommends that you schedule a follow-up appointment in: 3 months  

## 2012-02-21 NOTE — Assessment & Plan Note (Signed)
The patient has not been aware of any recurrent atrial fibrillation 

## 2012-02-21 NOTE — Progress Notes (Signed)
Brief Out-patient Oncology Nutrition Note  Reason: Patient Screened Positive For Nutrition Risk For Unintentional Weight Loss and Decreased Appetite.  Mrs. [name] is a 61 year old female patient of Dr. Gaylyn Rong, diagnosed with ischemic colitis.  Attempted to contact patient via telephone for positive nutrition risk. Left patient voice mail with RD contact information.   Wt Readings from Last 10 Encounters:  02/09/12 166 lb 11.2 oz (75.615 kg)  02/07/12 163 lb 12.8 oz (74.299 kg)  01/30/12 165 lb (74.844 kg)  01/11/12 183 lb (83.008 kg)  11/17/11 178 lb 9.6 oz (81.012 kg)  10/25/11 181 lb (82.101 kg)  10/25/11 181 lb (82.101 kg)  10/17/11 181 lb 14.4 oz (82.509 kg)  10/14/11 180 lb 1.9 oz (81.7 kg)  08/11/11 179 lb (81.194 kg)    RD available for nutrition needs.   Iven Finn, MS, RD, LDN [phone]

## 2012-02-21 NOTE — Assessment & Plan Note (Signed)
Patient has a past history of hypercholesterolemia.  Because of her recent GI problems and the need for adequate nutrition we are not pursuing aggressive lipid lowering strategies at this time

## 2012-02-21 NOTE — Progress Notes (Signed)
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 In early 2013 she was hospitalized on the medical service after presenting with sudden onset of severe diarrhea associated with syncope and hypotension. During the course of that hospitalization she underwent colonoscopy which showed ischemic colitis.  Also during that hospital stay she also had episodes of paroxysmal atrial fibrillation which responded to beta blocker.  During that hospital stay she had an echocardiogram showing normal LV systolic function and normal left atrial size. Because of her past history of anemia and multiple comorbidities she was not placed on warfarin.  She has a past history of small bowel obstruction. Her surgeon is Dr. Abbey Chatters.   She has multiple medical issues which are as outlined below. These include PAF, ischemic colitis, depression, HTN, and tobacco abuse. She has a normal EF per echo from earlier this year.  In September 2013 she was admitted at [facility] with another attack of ischemic colitis. She ended up having colon surgery with a colostomy placed. She had PAF while hospitalized. Also with MRSA from a PICC line. She was quite volume overloaded. We put her on Lasix when she returned to [place].  Since her last visit she has been feeling better. She is stronger. She is now taking Lasix only when necessary. She has been hypokalemic and is on potassium 20 mEq every day.  She has had a recent CT scan of the abdomen which did not show any evidence of vascular obstruction.  She has an appointment pending with rheumatology Dr. Dierdre Forth concerning possible vasculitis as the cause of her recurrent ischemic colitis.   Current Outpatient Prescriptions  Medication Sig Dispense Refill  .  aspirin 325 MG EC tablet Take 325 mg by mouth daily.      . benazepril (LOTENSIN) 20 MG tablet Take 20 mg by mouth daily.      . bisoprolol (ZEBETA) 5 MG tablet Take 5 mg by mouth daily before breakfast.      . buPROPion (WELLBUTRIN XL) 300 MG 24 hr tablet Take 300 mg by mouth daily before breakfast.       . cyanocobalamin (,VITAMIN B-12,) 1000 MCG/ML injection Inject 1 mL (1,000 mcg total) into the muscle every 30 (thirty) days.  6 mL  1  . esomeprazole (NEXIUM) 40 MG capsule Take 40 mg by mouth daily.       Marland Kitchen estradiol (ESTRACE) 2 MG tablet Take 2 mg by mouth daily before breakfast.       . furosemide (LASIX) 40 MG tablet Take 40 mg by mouth as directed. 1/2 daily as needed      . HYDROcodone-acetaminophen (NORCO/VICODIN) 5-325 MG per tablet Take 1 tablet by mouth every 4 (four) hours as needed for pain.  12 tablet  0  . potassium chloride SA (K-DUR,KLOR-CON) 20 MEQ tablet Take 20 mEq by mouth daily. As needed w/ lasix      . promethazine (PHENERGAN) 25 MG tablet Take 1 tablet (25 mg total) by mouth every 6 (six) hours as needed for nausea.  15 tablet  0  . cyclobenzaprine (FLEXERIL) 10 MG tablet Take 10 mg by mouth as needed. Muscle spasms      . lansoprazole (PREVACID) 15 MG capsule Take 15 mg by mouth daily.      Marland Kitchen  montelukast (SINGULAIR) 10 MG tablet Take 10 mg by mouth as needed. For breathing nasal drainage congestion relief        Allergies  Allergen Reactions  . Vancomycin Shortness Of Breath and Rash  . Codeine Nausea And Vomiting  . Tetanus Toxoids Other (See Comments)    serum  . Xarelto (Rivaroxaban)     Hives; rash  . Penicillins Hives and Rash    Patient Active Problem List  Diagnosis  . 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 Pure hypercholesterolemia  . Anemia  . Diarrhea  . Dehydration  . Chronic back pain  . Atrial fibrillation with rapid ventricular response  . Partial small bowel obstruction  . Atrial fibrillation  . Chronic ischemic  colitis  . Hypotension  . Osteoarthritis of hip  . Postoperative anemia due to acute blood loss  . Hyponatremia  . WBC disease  . Ileus  . CMML (chronic myelomonocytic leukemia)  . Ischemic colitis    History  Smoking status  . Current Some Day Smoker -- 0.2 packs/day for 20 years  Smokeless tobacco  . Never Used    History  Alcohol Use  . Yes    Comment: 1 - 2 glasses of wine per week    Family History  Problem Relation Age of Onset  . Stroke Father   . Atrial fibrillation Father   . Hypertension Mother     Review of Systems: Constitutional: no fever chills diaphoresis or fatigue or change in weight.  Head and neck: no hearing loss, no epistaxis, no photophobia or visual disturbance. Respiratory: No cough, shortness of breath or wheezing. Cardiovascular: No chest pain peripheral edema, palpitations. Gastrointestinal: No abdominal distention, no abdominal pain, no change in bowel habits hematochezia or melena. Genitourinary: No dysuria, no frequency, no urgency, no nocturia. Musculoskeletal:No arthralgias, no back pain, no gait disturbance or myalgias. Neurological: No dizziness, no headaches, no numbness, no seizures, no syncope, no weakness, no tremors. Hematologic: No lymphadenopathy, no easy bruising. Psychiatric: No confusion, no hallucinations, no sleep disturbance.    Physical Exam: Filed Vitals:   02/21/12 1615  BP: 144/93  Pulse: 74  Resp: 18   the general appearance reveals a well-developed well-nourished woman who appears to be chronically ill.Pupils equal and reactive.   Extraocular Movements are full.  There is no scleral icterus.  The mouth and pharynx are normal.  The neck is supple.  The carotids reveal no bruits.  The jugular venous pressure is normal.  The thyroid is not enlarged.  There is no lymphadenopathy.  The chest is clear to percussion and auscultation. There are no rales or rhonchi. Expansion of the chest is symmetrical.  The  precordium is quiet.  The first heart sound is normal.  The second heart sound is physiologically split.  There is no murmur gallop rub or click.  There is no abnormal lift or heave.  The abdomen reveals a colostomy pouch in the right lower quadrant.  She also has a bulge in the left lower quadrant at the site of the previous mucous fistula. Extremities show no phlebitis or edema.  Pulses are 1+ in the feet are cool to the touch chronically.  EKG today shows normal sinus rhythm with low-voltage QRS and no ischemic changes.  Assessment / Plan: Continue current medication and be rechecked here in 3 months.  The patient tells me that her surgeon states that she may have to have had eventual total colectomy after her workup is complete.  Her workup will include upcoming rheumatology evaluation with Dr. Dierdre Forth.

## 2012-02-23 ENCOUNTER — Telehealth (INDEPENDENT_AMBULATORY_CARE_PROVIDER_SITE_OTHER): Payer: Self-pay

## 2012-02-23 NOTE — Telephone Encounter (Signed)
Pt has appt with Dr. Dierdre Forth on 03/01/12 at 2:00pm.

## 2012-03-09 ENCOUNTER — Other Ambulatory Visit (HOSPITAL_BASED_OUTPATIENT_CLINIC_OR_DEPARTMENT_OTHER): Payer: BC Managed Care – PPO | Admitting: Lab

## 2012-03-09 DIAGNOSIS — D649 Anemia, unspecified: Secondary | ICD-10-CM

## 2012-03-09 DIAGNOSIS — C9310 Chronic myelomonocytic leukemia not having achieved remission: Secondary | ICD-10-CM

## 2012-03-09 DIAGNOSIS — E538 Deficiency of other specified B group vitamins: Secondary | ICD-10-CM

## 2012-03-09 DIAGNOSIS — C9210 Chronic myeloid leukemia, BCR/ABL-positive, not having achieved remission: Secondary | ICD-10-CM

## 2012-03-09 LAB — CBC WITH DIFFERENTIAL/PLATELET
EOS%: 2.1 % (ref 0.0–7.0)
Eosinophils Absolute: 0.1 10*3/uL (ref 0.0–0.5)
MCH: 34.7 pg — ABNORMAL HIGH (ref 25.1–34.0)
MCV: 100.5 fL (ref 79.5–101.0)
MONO%: 18.4 % — ABNORMAL HIGH (ref 0.0–14.0)
NEUT#: 3.3 10*3/uL (ref 1.5–6.5)
RBC: 2.85 10*6/uL — ABNORMAL LOW (ref 3.70–5.45)
RDW: 17.4 % — ABNORMAL HIGH (ref 11.2–14.5)

## 2012-03-20 ENCOUNTER — Ambulatory Visit (INDEPENDENT_AMBULATORY_CARE_PROVIDER_SITE_OTHER): Payer: BC Managed Care – PPO | Admitting: General Surgery

## 2012-03-20 ENCOUNTER — Other Ambulatory Visit (INDEPENDENT_AMBULATORY_CARE_PROVIDER_SITE_OTHER): Payer: Self-pay | Admitting: General Surgery

## 2012-03-20 VITALS — BP 120/80 | HR 86 | Resp 18 | Ht 64.0 in | Wt 166.0 lb

## 2012-03-20 DIAGNOSIS — K559 Vascular disorder of intestine, unspecified: Secondary | ICD-10-CM

## 2012-03-20 NOTE — Progress Notes (Signed)
Patient ID: [name], female   DOB: Jul 31, 1950, 61 y.o.   MRN: [id number]  No chief complaint on file.   HPI [name] is a 61 y.o. female.   HPI  She is here for followup of her ischemic colitis. A CT angiogram did not show any significant disease of the large and medium sized vessels. She saw Dr. Dierdre Forth (Rheumatology) and no definite evidence of vasculitis was discovered.  She has been having bulging around the colostomy and mucous fistula. CT scan demonstrated some parastomal hernias. It appears that both of her previous episodes could have been precipitated by a hypovolemic state. No obvious evidence of a hypercoagulable state.  Past Medical History  Diagnosis Date  . Hypertension     She has a past hx of essential  . 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 Inflammation of joint of knee     Since we last last saw her she developed problems with an acute which required surgical drainage by her orthopedist Dr. Cleophas Dunker.  Marland Kitchen MRSA (methicillin resistant Staphylococcus aureus)     Knee surgery drainage was positive for MRSA and she was treated with 3 weeks of doxycycline successfully.  . Diarrhea     Mild  . Exogenous obesity   . Atrial flutter     during hospitalization, 04/2011- related to anemia & illness/stress   . Depression   . GERD (gastroesophageal reflux disease)     2 hosp.- ischemic colitis - residual of Norovirus, 05/2011- sm. bowel obstruction  . Headache     migraine headache on occas, less now than when she was younger   . Arthritis     L hip, back, neck   . History of blood transfusion     04/2011- /w ischemic colitis   . Anemia     will see hematology consult prior to surgery, recommended by Dr. Patty Sermons  . Pneumonia     04/2011- not hospitalized , pt. denies SOB, changes in chest, breathing  . 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 Anemia 12/15/2010  . Ischemic colitis  01/31/2012    Past Surgical History  Procedure Date  . Knee surgery     I&D- 2008, post laceration   . Colonoscopy 05/16/2011    Procedure: COLONOSCOPY;  Surgeon: Vertell Novak., MD;  Location: [facility];  Service: Endoscopy;  Laterality: N/A;  . Appendectomy 1962  . Abdominal hysterectomy 1988  . Small intestine surgery 1992, 1999  . Laparotomy and lysis of adhesions   . Total hip arthroplasty 10/25/2011    Procedure: TOTAL HIP ARTHROPLASTY;  Surgeon: Valeria Batman, MD;  Location: [facility] OR;  Service: Orthopedics;  Laterality: Left;    Family History  Problem Relation Age of Onset  . Stroke Father   . Atrial fibrillation Father   . Hypertension Mother     Social History History  Substance Use Topics  . Smoking status: Current Some Day Smoker -- 0.2 packs/day for 20 years  . Smokeless tobacco: Never Used  . Alcohol Use: Yes     Comment: 1 - 2 glasses of wine per week    Allergies  Allergen Reactions  . Vancomycin Shortness Of Breath and Rash  . Codeine Nausea And Vomiting  . Tetanus Toxoids Other (See Comments)    serum  . Xarelto (Rivaroxaban)     Hives; rash  . Penicillins Hives and  Rash    Current Outpatient Prescriptions  Medication Sig Dispense Refill  . aspirin 325 MG EC tablet Take 325 mg by mouth daily.      . benazepril (LOTENSIN) 20 MG tablet Take 20 mg by mouth daily.      . bisoprolol (ZEBETA) 5 MG tablet Take 5 mg by mouth daily before breakfast.      . buPROPion (WELLBUTRIN XL) 300 MG 24 hr tablet Take 300 mg by mouth daily before breakfast.       . cyanocobalamin (,VITAMIN B-12,) 1000 MCG/ML injection Inject 1 mL (1,000 mcg total) into the muscle every 30 (thirty) days.  6 mL  1  . cyclobenzaprine (FLEXERIL) 10 MG tablet Take 10 mg by mouth as needed. Muscle spasms      . esomeprazole (NEXIUM) 40 MG capsule Take 40 mg by mouth daily.       Marland Kitchen estradiol (ESTRACE) 2 MG tablet Take 2 mg by mouth daily before breakfast.       . furosemide (LASIX)  40 MG tablet Take 40 mg by mouth as directed. 1/2 daily as needed      . HYDROcodone-acetaminophen (NORCO/VICODIN) 5-325 MG per tablet Take 1 tablet by mouth every 4 (four) hours as needed for pain.  12 tablet  0  . lansoprazole (PREVACID) 15 MG capsule Take 15 mg by mouth daily.      . montelukast (SINGULAIR) 10 MG tablet Take 10 mg by mouth as needed. For breathing nasal drainage congestion relief      . potassium chloride SA (K-DUR,KLOR-CON) 20 MEQ tablet Take 20 mEq by mouth daily. As needed w/ lasix      . promethazine (PHENERGAN) 25 MG tablet Take 1 tablet (25 mg total) by mouth every 6 (six) hours as needed for nausea.  15 tablet  0    Review of Systems Review of Systems  Gastrointestinal: Positive for abdominal pain (in LLQ around mucus fistula).    Blood pressure 120/80, pulse 86, resp. rate 18, height 5\' 4"  (1.626 m), weight 166 lb (75.297 kg).  Physical Exam Physical Exam  Constitutional: She appears well-developed and well-nourished. No distress.  Abdominal: Soft.       Midline scar. Bulging around the right-sided colostomy with some prolapse. Bulging around the left lower quadrant mucous fistula.    Data Reviewed CT scan  Assessment    Ischemic colitis affecting the right colon and left colon on 2 separate occasions. She is status post emergency left colectomy with colostomy and mucous fistula. Etiology of this still remains somewhat unclear.    Plan    We'll schedule a mesenteric angiogram to evaluate the small vessels. We discussed completion colectomy and ileal to sigmoid to rectal anastomosis pending the results of the angiogram. We talked about her having frequent bowel movements after this. I do not think the right colon should remain. She seems to understand and agrees with the plan.  I have explained the procedure and risks of colon surgery.  Risks include but are not limited to bleeding, infection, wound problems, anesthesia, anastomotic leak, need for  ileostomy, injury to intraabominal organs (such as intestine, spleen, kidney, bladder, ureter, etc.), ileus, irregular bowel habits.  She seems to understand.       Ganon Demasi J 03/20/2012, 1:54 PM

## 2012-03-20 NOTE — Patient Instructions (Addendum)
I will call you with the arteriogram results.  [facility]  ONE-DAY (1) PRE-OP HOME COLON PREP INSTRUCTIONS: ** MIRALAX / GATORADE PREP **  Fill the two prescriptions at a pharmacy of your choice.  You must follow the instructions below carefully.  If you have questions or problems, please call and speak to someone in the clinic department at our office:   [phone].  MIRALAX - GATORADE -- DULCOLAX TABS:   Fill the prescriptions for MIRALAX  (255 gm bottle)    In addition, purchase four (4) DULCOLAX TABLETS (no prescription required), and one 64 oz GATORADE.  (Do NOT purchase red Gatorade; any other flavor is acceptable).  INSTRUCTIONS: 1. Five days prior to your procedure do not eat nuts, popcorn, or fruit with seeds.  Stop all fiber supplements such as Metamucil, Citrucel, etc.  2. The day before your procedure: o 6:00am:  take (4) Dulcolax tablets.  You should remain on clear liquids for the entire day.   CLEAR LIQUIDS: clear bouillon, broth, jello (NOT RED), black coffee, tea, soda, etc o 10:00am:  add the bottle of MiraLax to the 64-oz bottle of Gatorade, and dissolve.  Begin drinking the Gatorade mixture until gone (8 oz every 15-30 minutes).  Continue clear liquids until midnight (or bedtime). o   3. The day of your procedure:   Do not eat or drink ANYTHING after midnight before your surgery.     If you take Heart or Blood Pressure medicine, ask the pre-op nurses about these during your preop appointment.   Further pre-operative instructions will be given to you from the hospital.   Expect to be contacted 5-7 days before your surgery.

## 2012-03-21 ENCOUNTER — Encounter (HOSPITAL_COMMUNITY): Payer: Self-pay | Admitting: Pharmacy Technician

## 2012-03-22 ENCOUNTER — Other Ambulatory Visit: Payer: Self-pay | Admitting: Physician Assistant

## 2012-03-27 ENCOUNTER — Encounter (HOSPITAL_COMMUNITY): Payer: Self-pay

## 2012-03-27 ENCOUNTER — Ambulatory Visit (HOSPITAL_COMMUNITY)
Admission: RE | Admit: 2012-03-27 | Discharge: 2012-03-27 | Disposition: A | Discharge status: Home / Self Care | Payer: BC Managed Care – PPO | Source: Ambulatory Visit | Attending: General Surgery | Admitting: General Surgery

## 2012-03-27 ENCOUNTER — Encounter (INDEPENDENT_AMBULATORY_CARE_PROVIDER_SITE_OTHER): Payer: Self-pay | Admitting: General Surgery

## 2012-03-27 ENCOUNTER — Other Ambulatory Visit (INDEPENDENT_AMBULATORY_CARE_PROVIDER_SITE_OTHER): Payer: Self-pay | Admitting: General Surgery

## 2012-03-27 DIAGNOSIS — C9210 Chronic myeloid leukemia, BCR/ABL-positive, not having achieved remission: Secondary | ICD-10-CM | POA: Insufficient documentation

## 2012-03-27 DIAGNOSIS — K559 Vascular disorder of intestine, unspecified: Secondary | ICD-10-CM | POA: Insufficient documentation

## 2012-03-27 DIAGNOSIS — I10 Essential (primary) hypertension: Secondary | ICD-10-CM | POA: Insufficient documentation

## 2012-03-27 DIAGNOSIS — Z9049 Acquired absence of other specified parts of digestive tract: Secondary | ICD-10-CM | POA: Insufficient documentation

## 2012-03-27 LAB — APTT: aPTT: 32 seconds (ref 24–37)

## 2012-03-27 LAB — BASIC METABOLIC PANEL
Calcium: 9.1 mg/dL (ref 8.4–10.5)
GFR calc Af Amer: 74 mL/min — ABNORMAL LOW (ref >90)
GFR calc non Af Amer: 64 mL/min — ABNORMAL LOW (ref >90)
Sodium: 130 mEq/L — ABNORMAL LOW (ref 135–145)

## 2012-03-27 LAB — CBC WITH DIFFERENTIAL/PLATELET
Lymphocytes Relative: 23 % (ref 12–46)
Lymphs Abs: 1.1 10*3/uL (ref 0.7–4.0)
Neutrophils Relative %: 61 % (ref 43–77)
Platelets: 239 10*3/uL (ref 150–400)
RBC: 2.92 MIL/uL — ABNORMAL LOW (ref 3.87–5.11)
WBC: 4.9 10*3/uL (ref 4.0–10.5)

## 2012-03-27 LAB — PROTIME-INR
INR: 0.99 (ref 0.00–1.49)
Prothrombin Time: 13 seconds (ref 11.6–15.2)

## 2012-03-27 MED ORDER — SODIUM CHLORIDE 0.9 % IV SOLN
INTRAVENOUS | Status: DC
  Administered 2012-03-27: 08:00:00 via INTRAVENOUS

## 2012-03-27 MED ORDER — FENTANYL CITRATE 0.05 MG/ML IJ SOLN
INTRAMUSCULAR | Status: AC
  Filled 2012-03-27: qty 4

## 2012-03-27 MED ORDER — MIDAZOLAM HCL 2 MG/2ML IJ SOLN
INTRAMUSCULAR | Status: AC
  Filled 2012-03-27: qty 4

## 2012-03-27 MED ORDER — IOHEXOL 300 MG/ML  SOLN
250.0000 mL | Freq: Once | INTRAMUSCULAR | Status: AC | PRN
  Administered 2012-03-27: 80 mL via INTRA_ARTERIAL

## 2012-03-27 MED ORDER — HYDROCODONE-ACETAMINOPHEN 5-325 MG PO TABS: 1.0000 | ORAL_TABLET | ORAL | Status: DC | PRN

## 2012-03-27 MED ORDER — FENTANYL CITRATE 0.05 MG/ML IJ SOLN
INTRAMUSCULAR | Status: AC | PRN
  Administered 2012-03-27 (×3): 50 ug via INTRAVENOUS

## 2012-03-27 MED ORDER — MIDAZOLAM HCL 2 MG/2ML IJ SOLN
INTRAMUSCULAR | Status: AC | PRN
  Administered 2012-03-27 (×2): 1 mg via INTRAVENOUS

## 2012-03-27 NOTE — Procedures (Signed)
Interventional Radiology Procedure Note  Procedure: Aortogram and mesenteric arteriogram Access sit:  Right CFA, hemostasis by manual pressure Complications: None Recommendations: - Bedrest x 6 hrs, first 4 hours flat next 2 hrs may elevate HON up to 20 deg for pt comfort - ADAT after 2 hrs if keep pt can tolerate being flat  Signed,  Sterling Big, MD Vascular & Interventional Radiologist [facility] Radiology

## 2012-03-27 NOTE — ED Notes (Signed)
Request bed from short stay

## 2012-03-27 NOTE — Progress Notes (Signed)
Patient ID: [name], female   DOB: [birth year], 61 y.o.   MRN: [id number] I reviewed the results of her visceral mesenteric angiogram. There is no significant disease and moderate to small blood vessels. There is no angiogram evidence of vasculitis. This was discussed with her. Thus, we will proceed with her surgery as planned. She was instructed to stop her aspirin 5 days before the surgery. We will work on scheduling the surgery.

## 2012-03-27 NOTE — H&P (Signed)
[name] is an 61 y.o. female.   Chief Complaint: Hx Ischemic colitis; partial colectomy 12/18/11. Has has 2 episodes of colitis in last yr; MD feels probable candidate for full colectomy Scheduled today for mesenteric arteriogram to evaluate small vessel disease HPI: HTN; elev LFTs; a flutter; GERD; CML; Ischemic colitis  Past Medical History  Diagnosis Date  . Hypertension     She has a past hx of essential  . 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 Inflammation of joint of knee     Since we last last saw her she developed problems with an acute which required surgical drainage by her orthopedist Dr. Cleophas Dunker.  Marland Kitchen MRSA (methicillin resistant Staphylococcus aureus)     Knee surgery drainage was positive for MRSA and she was treated with 3 weeks of doxycycline successfully.  . Diarrhea     Mild  . Exogenous obesity   . Atrial flutter     during hospitalization, 04/2011- related to anemia & illness/stress   . Depression   . GERD (gastroesophageal reflux disease)     2 hosp.- ischemic colitis - residual of Norovirus, 05/2011- sm. bowel obstruction  . Headache     migraine headache on occas, less now than when she was younger   . Arthritis     L hip, back, neck   . History of blood transfusion     04/2011- /w ischemic colitis   . Anemia     will see hematology consult prior to surgery, recommended by Dr. Patty Sermons  . Pneumonia     04/2011- not hospitalized , pt. denies SOB, changes in chest, breathing  . 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 Anemia 12/15/2010  . Ischemic colitis 01/31/2012    Past Surgical History  Procedure Date  . Knee surgery     I&D- 2008, post laceration   . Colonoscopy 05/16/2011    Procedure: COLONOSCOPY;  Surgeon: Vertell Novak., MD;  Location: [facility];  Service: Endoscopy;  Laterality: N/A;  . Appendectomy 1962  . Abdominal hysterectomy 1988  . Small  intestine surgery 1992, 1999  . Laparotomy and lysis of adhesions   . Total hip arthroplasty 10/25/2011    Procedure: TOTAL HIP ARTHROPLASTY;  Surgeon: Valeria Batman, MD;  Location: [facility] OR;  Service: Orthopedics;  Laterality: Left;    Family History  Problem Relation Age of Onset  . Stroke Father   . Atrial fibrillation Father   . Hypertension Mother    Social History:  reports that she has been smoking.  She has never used smokeless tobacco. She reports that she drinks alcohol. She reports that she does not use illicit drugs.  Allergies:  Allergies  Allergen Reactions  . Vancomycin Hives and Rash    ? wheezing  . Codeine Nausea And Vomiting  . Tetanus Toxoids Other (See Comments)    serum  . Penicillins Hives and Rash  . Xarelto (Rivaroxaban) Hives and Rash    ?     (Not in a hospital admission)  No results found for this or any previous visit (from the past 48 hour(s)). No results found.  Review of Systems  Constitutional: Negative for fever.  Respiratory: Positive for cough.   Cardiovascular: Negative for chest pain.  Gastrointestinal: Positive for abdominal pain. Negative for nausea and vomiting.  Neurological: Negative for weakness and headaches.    Blood pressure 141/82,  temperature 95.2 F (35.1 C), temperature source Axillary, resp. rate 16. Physical Exam  Constitutional: She is oriented to person, place, and time.  Cardiovascular: Normal rate, regular rhythm and normal heart sounds.   No murmur heard. Respiratory: Effort normal and breath sounds normal. She has no wheezes.  GI: Soft. Bowel sounds are normal. There is tenderness.  Musculoskeletal: Normal range of motion.  Neurological: She is alert and oriented to person, place, and time.  Psychiatric: She has a normal mood and affect. Her behavior is normal. Judgment and thought content normal.     Assessment/Plan Hx ischemic coliits; partial colectomy Sep 2013 Still with 2 episodes of ischemic  colitis in this yr Prob candidate for full colectomy per MD Scheduled for mesenteric arteriogram to eval sm vessel dz Pt aware of procedure benefits and risks and agreeable to proceed Consent signed and in chart  Eusebio Blazejewski A 03/27/2012, 7:27 AM

## 2012-03-27 NOTE — ED Notes (Signed)
Manual pressure to R fem art to remove sheath per Dr Archer Asa

## 2012-03-27 NOTE — H&P (Signed)
Agree with PA note.  Will proceed with diagnostic angiogram to evaluate for small vessel disease / vasculitis and also for pre-surgical planning.   Signed,  Sterling Big, MD Vascular & Interventional Radiologist [facility] Radiology

## 2012-03-27 NOTE — ED Notes (Signed)
Review BR and groin precautions with pt by Dr

## 2012-04-05 ENCOUNTER — Telehealth: Payer: Self-pay | Admitting: *Deleted

## 2012-04-05 ENCOUNTER — Other Ambulatory Visit (HOSPITAL_BASED_OUTPATIENT_CLINIC_OR_DEPARTMENT_OTHER): Payer: BC Managed Care – PPO | Admitting: Lab

## 2012-04-05 DIAGNOSIS — D649 Anemia, unspecified: Secondary | ICD-10-CM

## 2012-04-05 DIAGNOSIS — E538 Deficiency of other specified B group vitamins: Secondary | ICD-10-CM

## 2012-04-05 DIAGNOSIS — C9210 Chronic myeloid leukemia, BCR/ABL-positive, not having achieved remission: Secondary | ICD-10-CM

## 2012-04-05 DIAGNOSIS — C9310 Chronic myelomonocytic leukemia not having achieved remission: Secondary | ICD-10-CM

## 2012-04-05 LAB — CBC WITH DIFFERENTIAL/PLATELET
EOS%: 1.9 % (ref 0.0–7.0)
Eosinophils Absolute: 0.1 10*3/uL (ref 0.0–0.5)
LYMPH%: 22.3 % (ref 14.0–49.7)
MCH: 35.7 pg — ABNORMAL HIGH (ref 25.1–34.0)
MCHC: 34.6 g/dL (ref 31.5–36.0)
MCV: 103.1 fL — ABNORMAL HIGH (ref 79.5–101.0)
MONO%: 19.9 % — ABNORMAL HIGH (ref 0.0–14.0)
Platelets: 268 10*3/uL (ref 145–400)
RBC: 2.92 10*6/uL — ABNORMAL LOW (ref 3.70–5.45)
RDW: 14 % (ref 11.2–14.5)

## 2012-04-05 NOTE — Telephone Encounter (Signed)
Message copied by Wende Mott on Thu Apr 05, 2012  5:25 PM ------      Message from: HA, Raliegh Ip T      Created: Thu Apr 05, 2012  2:14 PM       Please call pt.  Her slight anemia is still present.  I've reviewed her angiogram that did not reveal anything.  I'm aware of her upcoming surgery with Dr. Abbey Chatters.

## 2012-04-05 NOTE — Telephone Encounter (Signed)
Called pt w/ lab results and relayed Dr. Lodema Pilot message below.  Pt verbalized understanding and states may need to r/s her next appt at end of January due to her upcoming colon surgery.  Instructed pt to call back when she knows more about her surgery dates and we can r/s at any time.  She verbalized understanding.

## 2012-05-09 ENCOUNTER — Encounter (HOSPITAL_COMMUNITY): Payer: Self-pay | Admitting: Pharmacy Technician

## 2012-05-10 ENCOUNTER — Encounter (HOSPITAL_COMMUNITY): Payer: Self-pay

## 2012-05-10 ENCOUNTER — Encounter (HOSPITAL_COMMUNITY)
Admission: RE | Admit: 2012-05-10 | Discharge: 2012-05-10 | Disposition: A | Discharge status: Home / Self Care | Payer: BC Managed Care – PPO | Source: Ambulatory Visit | Attending: General Surgery | Admitting: General Surgery

## 2012-05-10 ENCOUNTER — Other Ambulatory Visit (HOSPITAL_COMMUNITY): Payer: Self-pay | Admitting: General Surgery

## 2012-05-10 ENCOUNTER — Ambulatory Visit (HOSPITAL_COMMUNITY)
Admission: RE | Admit: 2012-05-10 | Discharge: 2012-05-10 | Disposition: A | Discharge status: Home / Self Care | Payer: BC Managed Care – PPO | Source: Ambulatory Visit | Attending: General Surgery | Admitting: General Surgery

## 2012-05-10 DIAGNOSIS — Z01812 Encounter for preprocedural laboratory examination: Secondary | ICD-10-CM | POA: Insufficient documentation

## 2012-05-10 DIAGNOSIS — Z01818 Encounter for other preprocedural examination: Secondary | ICD-10-CM | POA: Insufficient documentation

## 2012-05-10 HISTORY — DX: Dizziness and giddiness: R42

## 2012-05-10 LAB — COMPREHENSIVE METABOLIC PANEL
ALT: 37 U/L — ABNORMAL HIGH (ref 0–35)
Alkaline Phosphatase: 496 U/L — ABNORMAL HIGH (ref 39–117)
BUN: 19 mg/dL (ref 6–23)
CO2: 20 mEq/L (ref 19–32)
Chloride: 93 mEq/L — ABNORMAL LOW (ref 96–112)
GFR calc Af Amer: 80 mL/min — ABNORMAL LOW (ref >90)
Glucose, Bld: 95 mg/dL (ref 70–99)
Potassium: 4.7 mEq/L (ref 3.5–5.1)
Sodium: 126 mEq/L — ABNORMAL LOW (ref 135–145)
Total Bilirubin: 0.3 mg/dL (ref 0.3–1.2)
Total Protein: 8 g/dL (ref 6.0–8.3)

## 2012-05-10 LAB — CBC WITH DIFFERENTIAL/PLATELET
Basophils Relative: 0 % (ref 0–1)
Eosinophils Relative: 3 % (ref 0–5)
HCT: 31.3 % — ABNORMAL LOW (ref 36.0–46.0)
Hemoglobin: 10.7 g/dL — ABNORMAL LOW (ref 12.0–15.0)
Lymphocytes Relative: 30 % (ref 12–46)
MCHC: 34.2 g/dL (ref 30.0–36.0)
Monocytes Relative: 19 % — ABNORMAL HIGH (ref 3–12)
Neutro Abs: 1.5 10*3/uL — ABNORMAL LOW (ref 1.7–7.7)
Neutrophils Relative %: 48 % (ref 43–77)
RBC: 3.21 MIL/uL — ABNORMAL LOW (ref 3.87–5.11)

## 2012-05-10 LAB — PROTIME-INR: Prothrombin Time: 12.9 seconds (ref 11.6–15.2)

## 2012-05-10 LAB — SURGICAL PCR SCREEN: MRSA, PCR: POSITIVE — AB

## 2012-05-10 NOTE — Progress Notes (Signed)
Cbc with dif and cmet results routed to dr Abbey Chatters  inbox by epic

## 2012-05-10 NOTE — Patient Instructions (Addendum)
19 [name]  05/10/2012   Your procedure is scheduled on: 05-17-12  Report to [facility] at 0530 AM.  Call this number if you have problems the morning of surgery [phone]   Remember:   Do not eat food or drink liquids :After Midnight.     Take these medicines the morning of surgery with A SIP OF WATER: bisprolol, wellbutrin, nexium, singulair if needed,                                 SEE [place] PREPARING FOR SURGERY SHEET   Do not wear jewelry, make-up or nail polish.  Do not wear lotions, powders, or perfumes. You may wear deodorant.   Men may shave face and neck.  Do not bring valuables to the hospital.  Contacts, dentures or bridgework may not be worn into surgery.  Leave suitcase in the car. After surgery it may be brought to your room.  For patients admitted to the hospital, checkout time is 11:00 AM the day of discharge.   Patients discharged the day of surgery will not be allowed to drive home.  Name and phone number of your driver:  Special Instructions: N/A   Please read over the following fact sheets that you were given: MRSA Information, blood fact sheet Call Cain Sieve RN pre op nurse if needed [phone]    FAILURE TO FOLLOW THESE INSTRUCTIONS MAY RESULT IN THE CANCELLATION OF YOUR SURGERY. PATIENT SIGNATURE___________________________________________

## 2012-05-10 NOTE — Progress Notes (Signed)
lov dr Patty Sermons cardiology 02-23-12 epic Chest xray 10-25-11 abnormal will repeat with pre op 05-10-12 ekg 11-5-133 epic 12-19-11 echo epic

## 2012-05-11 ENCOUNTER — Other Ambulatory Visit (HOSPITAL_COMMUNITY): Payer: Self-pay | Admitting: General Surgery

## 2012-05-15 ENCOUNTER — Other Ambulatory Visit (HOSPITAL_COMMUNITY): Payer: Self-pay | Admitting: *Deleted

## 2012-05-15 NOTE — Progress Notes (Signed)
Repeat bmet ordered day of surgery due to sodium 126 on pre op labs 05-10-12

## 2012-05-16 ENCOUNTER — Telehealth: Payer: Self-pay | Admitting: Oncology

## 2012-05-16 NOTE — Telephone Encounter (Signed)
pt is having surgery tomorrow and needed to r/s after 6-8 weeks...Marland KitchenMarland KitchenDone

## 2012-05-16 NOTE — Anesthesia Preprocedure Evaluation (Addendum)
Anesthesia Evaluation  Patient identified by MRN, date of birth, ID band Patient awake    Reviewed: Allergy & Precautions, H&P , NPO status , Patient's Chart, lab work & pertinent test results, reviewed documented beta blocker date and time   History of Anesthesia Complications (+) AWARENESS UNDER ANESTHESIA  Airway Mallampati: II TM Distance: >3 FB Neck ROM: full    Dental No notable dental hx. (+) Teeth Intact and Dental Advisory Given   Pulmonary Current Smoker,  breath sounds clear to auscultation  Pulmonary exam normal       Cardiovascular Exercise Tolerance: Good hypertension, Pt. on medications + dysrhythmias Atrial Fibrillation Rhythm:regular Rate:Normal  AF x 1 during 1/13 hospitalization   Neuro/Psych negative neurological ROS  negative psych ROS   GI/Hepatic negative GI ROS, Neg liver ROS, GERD-  Medicated and Controlled,  Endo/Other  negative endocrine ROS  Renal/GU negative Renal ROS  negative genitourinary   Musculoskeletal   Abdominal   Peds  Hematology  (+) Blood dyscrasia, anemia , Hgb. 10.7   Anesthesia Other Findings   Reproductive/Obstetrics negative OB ROS                         Anesthesia Physical Anesthesia Plan  ASA: III  Anesthesia Plan: General   Post-op Pain Management:    Induction: Intravenous  Airway Management Planned: Oral ETT  Additional Equipment:   Intra-op Plan:   Post-operative Plan: Extubation in OR  Informed Consent: I have reviewed the patients History and Physical, chart, labs and discussed the procedure including the risks, benefits and alternatives for the proposed anesthesia with the patient or authorized representative who has indicated his/her understanding and acceptance.   Dental Advisory Given  Plan Discussed with: CRNA and Surgeon  Anesthesia Plan Comments:         Anesthesia Quick Evaluation

## 2012-05-17 ENCOUNTER — Ambulatory Visit: Payer: BC Managed Care – PPO | Admitting: Oncology

## 2012-05-17 ENCOUNTER — Other Ambulatory Visit: Payer: BC Managed Care – PPO | Admitting: Lab

## 2012-05-17 ENCOUNTER — Inpatient Hospital Stay (HOSPITAL_COMMUNITY): Payer: BC Managed Care – PPO | Admitting: Anesthesiology

## 2012-05-17 ENCOUNTER — Encounter (HOSPITAL_COMMUNITY): Payer: Self-pay | Admitting: Anesthesiology

## 2012-05-17 ENCOUNTER — Encounter (HOSPITAL_COMMUNITY): Payer: Self-pay

## 2012-05-17 ENCOUNTER — Encounter (HOSPITAL_COMMUNITY)
Admission: RE | Disposition: A | Discharge status: Home Health | Payer: Self-pay | Source: Ambulatory Visit | Attending: General Surgery

## 2012-05-17 ENCOUNTER — Encounter (HOSPITAL_COMMUNITY): Payer: Self-pay | Admitting: *Deleted

## 2012-05-17 ENCOUNTER — Inpatient Hospital Stay (HOSPITAL_COMMUNITY)
Admission: RE | Admit: 2012-05-17 | Discharge: 2012-06-06 | DRG: 585 | Disposition: A | Discharge status: Home Health | Payer: BC Managed Care – PPO | Source: Ambulatory Visit | Attending: General Surgery | Admitting: General Surgery

## 2012-05-17 DIAGNOSIS — Y838 Other surgical procedures as the cause of abnormal reaction of the patient, or of later complication, without mention of misadventure at the time of the procedure: Secondary | ICD-10-CM | POA: Diagnosis not present

## 2012-05-17 DIAGNOSIS — Z9049 Acquired absence of other specified parts of digestive tract: Secondary | ICD-10-CM

## 2012-05-17 DIAGNOSIS — F29 Unspecified psychosis not due to a substance or known physiological condition: Secondary | ICD-10-CM | POA: Diagnosis not present

## 2012-05-17 DIAGNOSIS — E876 Hypokalemia: Secondary | ICD-10-CM | POA: Diagnosis not present

## 2012-05-17 DIAGNOSIS — Z433 Encounter for attention to colostomy: Secondary | ICD-10-CM

## 2012-05-17 DIAGNOSIS — E871 Hypo-osmolality and hyponatremia: Secondary | ICD-10-CM | POA: Diagnosis present

## 2012-05-17 DIAGNOSIS — K660 Peritoneal adhesions (postprocedural) (postinfection): Secondary | ICD-10-CM

## 2012-05-17 DIAGNOSIS — K551 Chronic vascular disorders of intestine: Secondary | ICD-10-CM | POA: Diagnosis present

## 2012-05-17 DIAGNOSIS — I4892 Unspecified atrial flutter: Secondary | ICD-10-CM | POA: Diagnosis present

## 2012-05-17 DIAGNOSIS — I509 Heart failure, unspecified: Secondary | ICD-10-CM | POA: Diagnosis present

## 2012-05-17 DIAGNOSIS — IMO0002 Reserved for concepts with insufficient information to code with codable children: Secondary | ICD-10-CM

## 2012-05-17 DIAGNOSIS — J9819 Other pulmonary collapse: Secondary | ICD-10-CM | POA: Diagnosis not present

## 2012-05-17 DIAGNOSIS — C9310 Chronic myelomonocytic leukemia not having achieved remission: Secondary | ICD-10-CM | POA: Diagnosis present

## 2012-05-17 DIAGNOSIS — K651 Peritoneal abscess: Secondary | ICD-10-CM | POA: Diagnosis not present

## 2012-05-17 DIAGNOSIS — I491 Atrial premature depolarization: Secondary | ICD-10-CM

## 2012-05-17 DIAGNOSIS — E669 Obesity, unspecified: Secondary | ICD-10-CM | POA: Diagnosis present

## 2012-05-17 DIAGNOSIS — Z6827 Body mass index (BMI) 27.0-27.9, adult: Secondary | ICD-10-CM

## 2012-05-17 DIAGNOSIS — E46 Unspecified protein-calorie malnutrition: Secondary | ICD-10-CM | POA: Diagnosis present

## 2012-05-17 DIAGNOSIS — K219 Gastro-esophageal reflux disease without esophagitis: Secondary | ICD-10-CM | POA: Diagnosis present

## 2012-05-17 DIAGNOSIS — R000 Tachycardia, unspecified: Secondary | ICD-10-CM

## 2012-05-17 DIAGNOSIS — D62 Acute posthemorrhagic anemia: Secondary | ICD-10-CM | POA: Diagnosis not present

## 2012-05-17 DIAGNOSIS — I4891 Unspecified atrial fibrillation: Secondary | ICD-10-CM

## 2012-05-17 DIAGNOSIS — J151 Pneumonia due to Pseudomonas: Secondary | ICD-10-CM | POA: Diagnosis not present

## 2012-05-17 DIAGNOSIS — K565 Intestinal adhesions [bands], unspecified as to partial versus complete obstruction: Secondary | ICD-10-CM | POA: Diagnosis present

## 2012-05-17 DIAGNOSIS — F17200 Nicotine dependence, unspecified, uncomplicated: Secondary | ICD-10-CM | POA: Diagnosis present

## 2012-05-17 DIAGNOSIS — K668 Other specified disorders of peritoneum: Secondary | ICD-10-CM | POA: Diagnosis present

## 2012-05-17 DIAGNOSIS — I10 Essential (primary) hypertension: Secondary | ICD-10-CM | POA: Diagnosis present

## 2012-05-17 DIAGNOSIS — K929 Disease of digestive system, unspecified: Secondary | ICD-10-CM | POA: Diagnosis not present

## 2012-05-17 DIAGNOSIS — R5381 Other malaise: Secondary | ICD-10-CM | POA: Diagnosis present

## 2012-05-17 DIAGNOSIS — I498 Other specified cardiac arrhythmias: Secondary | ICD-10-CM | POA: Diagnosis not present

## 2012-05-17 DIAGNOSIS — C9210 Chronic myeloid leukemia, BCR/ABL-positive, not having achieved remission: Secondary | ICD-10-CM | POA: Diagnosis present

## 2012-05-17 DIAGNOSIS — Z96649 Presence of unspecified artificial hip joint: Secondary | ICD-10-CM

## 2012-05-17 DIAGNOSIS — D72829 Elevated white blood cell count, unspecified: Secondary | ICD-10-CM | POA: Diagnosis not present

## 2012-05-17 HISTORY — PX: BIOPSY: SHX5522

## 2012-05-17 HISTORY — PX: PARTIAL COLECTOMY: SHX5273

## 2012-05-17 HISTORY — PX: LAPAROTOMY: SHX154

## 2012-05-17 HISTORY — PX: LYSIS OF ADHESION: SHX5961

## 2012-05-17 HISTORY — PX: COLOSTOMY CLOSURE: SHX1381

## 2012-05-17 LAB — BASIC METABOLIC PANEL
BUN: 14 mg/dL (ref 6–23)
CO2: 22 mEq/L (ref 19–32)
Calcium: 9 mg/dL (ref 8.4–10.5)
Chloride: 93 mEq/L — ABNORMAL LOW (ref 96–112)
Creatinine, Ser: 0.81 mg/dL (ref 0.50–1.10)
GFR calc Af Amer: 88 mL/min — ABNORMAL LOW (ref >90)
GFR calc non Af Amer: 76 mL/min — ABNORMAL LOW (ref >90)
Glucose, Bld: 105 mg/dL — ABNORMAL HIGH (ref 70–99)
Potassium: 4.6 mEq/L (ref 3.5–5.1)
Sodium: 126 mEq/L — ABNORMAL LOW (ref 135–145)

## 2012-05-17 LAB — TYPE AND SCREEN
ABO/RH(D): A POS
Antibody Screen: POSITIVE
DAT, IgG: NEGATIVE

## 2012-05-17 SURGERY — COLECTOMY, PARTIAL
Anesthesia: General | Site: Abdomen | Wound class: Contaminated

## 2012-05-17 MED ORDER — EPHEDRINE SULFATE 50 MG/ML IJ SOLN
INTRAMUSCULAR | Status: DC | PRN
  Administered 2012-05-17: 10 mg via INTRAVENOUS
  Administered 2012-05-17 (×2): 5 mg via INTRAVENOUS

## 2012-05-17 MED ORDER — DEXTROSE 5 % IV SOLN
2.0000 g | INTRAVENOUS | Status: AC
  Administered 2012-05-17: 2 g via INTRAVENOUS
  Filled 2012-05-17: qty 2

## 2012-05-17 MED ORDER — ALVIMOPAN 12 MG PO CAPS
12.0000 mg | ORAL_CAPSULE | Freq: Two times a day (BID) | ORAL | Status: DC
  Administered 2012-05-18 – 2012-05-19 (×3): 12 mg via ORAL
  Filled 2012-05-17 (×4): qty 1

## 2012-05-17 MED ORDER — MORPHINE SULFATE (PF) 1 MG/ML IV SOLN
INTRAVENOUS | Status: DC
  Administered 2012-05-17: 30.46 mg via INTRAVENOUS
  Administered 2012-05-17 (×2): via INTRAVENOUS
  Administered 2012-05-17: 10.5 mg via INTRAVENOUS
  Administered 2012-05-17: 14:00:00 via INTRAVENOUS
  Administered 2012-05-18: 28.5 mg via INTRAVENOUS
  Administered 2012-05-18: 03:00:00 via INTRAVENOUS
  Administered 2012-05-18: 33 mg via INTRAVENOUS
  Filled 2012-05-17 (×3): qty 25

## 2012-05-17 MED ORDER — NYSTATIN 100000 UNIT/GM EX POWD: CUTANEOUS | Status: DC | PRN

## 2012-05-17 MED ORDER — PHENYLEPHRINE HCL 10 MG/ML IJ SOLN
20.0000 mg | INTRAVENOUS | Status: DC | PRN
  Administered 2012-05-17: 20 ug/min via INTRAVENOUS

## 2012-05-17 MED ORDER — GLYCOPYRROLATE 0.2 MG/ML IJ SOLN
INTRAMUSCULAR | Status: DC | PRN
  Administered 2012-05-17: .6 mg via INTRAVENOUS

## 2012-05-17 MED ORDER — ROCURONIUM BROMIDE 100 MG/10ML IV SOLN
INTRAVENOUS | Status: DC | PRN
  Administered 2012-05-17: 50 mg via INTRAVENOUS
  Administered 2012-05-17: 5 mg via INTRAVENOUS
  Administered 2012-05-17: 10 mg via INTRAVENOUS
  Administered 2012-05-17: 20 mg via INTRAVENOUS

## 2012-05-17 MED ORDER — ONDANSETRON HCL 4 MG PO TABS
4.0000 mg | ORAL_TABLET | Freq: Four times a day (QID) | ORAL | Status: DC | PRN
  Administered 2012-05-22 – 2012-06-06 (×3): 4 mg via ORAL
  Filled 2012-05-17 (×2): qty 1

## 2012-05-17 MED ORDER — LACTATED RINGERS IV SOLN
INTRAVENOUS | Status: DC | PRN
  Administered 2012-05-17 (×2): via INTRAVENOUS

## 2012-05-17 MED ORDER — NEOSTIGMINE METHYLSULFATE 1 MG/ML IJ SOLN
INTRAMUSCULAR | Status: DC | PRN
  Administered 2012-05-17: 4 mg via INTRAVENOUS

## 2012-05-17 MED ORDER — PROPOFOL 10 MG/ML IV BOLUS
INTRAVENOUS | Status: DC | PRN
  Administered 2012-05-17: 200 mg via INTRAVENOUS

## 2012-05-17 MED ORDER — NALOXONE HCL 0.4 MG/ML IJ SOLN: 0.4000 mg | INTRAMUSCULAR | Status: DC | PRN

## 2012-05-17 MED ORDER — DIPHENHYDRAMINE HCL 50 MG/ML IJ SOLN: 12.5000 mg | Freq: Four times a day (QID) | INTRAMUSCULAR | Status: DC | PRN

## 2012-05-17 MED ORDER — MIDAZOLAM HCL 5 MG/5ML IJ SOLN
INTRAMUSCULAR | Status: DC | PRN
  Administered 2012-05-17: 2 mg via INTRAVENOUS

## 2012-05-17 MED ORDER — ONDANSETRON HCL 4 MG/2ML IJ SOLN
4.0000 mg | INTRAMUSCULAR | Status: DC | PRN
  Administered 2012-05-18 – 2012-06-05 (×57): 4 mg via INTRAVENOUS
  Filled 2012-05-17 (×60): qty 2

## 2012-05-17 MED ORDER — BIOTENE DRY MOUTH MT LIQD
15.0000 mL | Freq: Two times a day (BID) | OROMUCOSAL | Status: DC
  Administered 2012-05-17 – 2012-05-20 (×5): 15 mL via OROMUCOSAL

## 2012-05-17 MED ORDER — BENAZEPRIL HCL 20 MG PO TABS
20.0000 mg | ORAL_TABLET | Freq: Every day | ORAL | Status: DC
  Administered 2012-05-18 – 2012-05-22 (×5): 20 mg via ORAL
  Filled 2012-05-17 (×5): qty 1

## 2012-05-17 MED ORDER — MONTELUKAST SODIUM 10 MG PO TABS
10.0000 mg | ORAL_TABLET | ORAL | Status: DC | PRN
  Filled 2012-05-17: qty 1

## 2012-05-17 MED ORDER — BISOPROLOL FUMARATE 5 MG PO TABS
5.0000 mg | ORAL_TABLET | Freq: Every day | ORAL | Status: DC
  Administered 2012-05-18 – 2012-05-22 (×5): 5 mg via ORAL
  Filled 2012-05-17 (×5): qty 1

## 2012-05-17 MED ORDER — HYDROMORPHONE HCL PF 1 MG/ML IJ SOLN
INTRAMUSCULAR | Status: DC | PRN
  Administered 2012-05-17 (×3): 0.5 mg via INTRAVENOUS

## 2012-05-17 MED ORDER — ONDANSETRON HCL 4 MG/2ML IJ SOLN
4.0000 mg | Freq: Four times a day (QID) | INTRAMUSCULAR | Status: DC | PRN
  Administered 2012-05-17: 4 mg via INTRAVENOUS

## 2012-05-17 MED ORDER — LORATADINE 10 MG PO TABS
5.0000 mg | ORAL_TABLET | Freq: Two times a day (BID) | ORAL | Status: DC
  Administered 2012-05-17 – 2012-05-22 (×10): 5 mg via ORAL
  Filled 2012-05-17 (×12): qty 0.5

## 2012-05-17 MED ORDER — PANTOPRAZOLE SODIUM 40 MG IV SOLR
40.0000 mg | INTRAVENOUS | Status: DC
  Administered 2012-05-17 – 2012-05-20 (×4): 40 mg via INTRAVENOUS
  Filled 2012-05-17 (×5): qty 40

## 2012-05-17 MED ORDER — ACETAMINOPHEN 10 MG/ML IV SOLN
INTRAVENOUS | Status: DC | PRN
  Administered 2012-05-17: 1000 mg via INTRAVENOUS

## 2012-05-17 MED ORDER — HYDROMORPHONE HCL PF 1 MG/ML IJ SOLN
0.2500 mg | INTRAMUSCULAR | Status: DC | PRN
  Administered 2012-05-17 (×4): 0.5 mg via INTRAVENOUS

## 2012-05-17 MED ORDER — LORATADINE-PSEUDOEPHEDRINE ER 5-120 MG PO TB12: 1.0000 | ORAL_TABLET | Freq: Two times a day (BID) | ORAL | Status: DC

## 2012-05-17 MED ORDER — PSEUDOEPHEDRINE HCL 60 MG PO TABS
120.0000 mg | ORAL_TABLET | Freq: Two times a day (BID) | ORAL | Status: DC
  Administered 2012-05-17 – 2012-05-22 (×10): 120 mg via ORAL
  Filled 2012-05-17 (×11): qty 2

## 2012-05-17 MED ORDER — PHENYLEPHRINE HCL 10 MG/ML IJ SOLN
INTRAMUSCULAR | Status: DC | PRN
  Administered 2012-05-17: 40 ug via INTRAVENOUS

## 2012-05-17 MED ORDER — KCL IN DEXTROSE-NACL 20-5-0.9 MEQ/L-%-% IV SOLN
INTRAVENOUS | Status: DC
  Administered 2012-05-17 – 2012-05-22 (×10): via INTRAVENOUS
  Filled 2012-05-17 (×12): qty 1000

## 2012-05-17 MED ORDER — SODIUM CHLORIDE 0.9 % IJ SOLN: 10.0000 mL | INTRAMUSCULAR | Status: DC | PRN

## 2012-05-17 MED ORDER — LIDOCAINE HCL (CARDIAC) 20 MG/ML IV SOLN
INTRAVENOUS | Status: DC | PRN
  Administered 2012-05-17: 100 mg via INTRAVENOUS

## 2012-05-17 MED ORDER — DEXTROSE 5 % IV SOLN
2.0000 g | Freq: Two times a day (BID) | INTRAVENOUS | Status: AC
  Administered 2012-05-17: 2 g via INTRAVENOUS
  Filled 2012-05-17: qty 2

## 2012-05-17 MED ORDER — ONDANSETRON HCL 4 MG/2ML IJ SOLN
INTRAMUSCULAR | Status: DC | PRN
  Administered 2012-05-17: 4 mg via INTRAVENOUS

## 2012-05-17 MED ORDER — LACTATED RINGERS IV SOLN: INTRAVENOUS | Status: DC

## 2012-05-17 MED ORDER — SODIUM CHLORIDE 0.9 % IJ SOLN: 9.0000 mL | INTRAMUSCULAR | Status: DC | PRN

## 2012-05-17 MED ORDER — FENTANYL CITRATE 0.05 MG/ML IJ SOLN
INTRAMUSCULAR | Status: DC | PRN
  Administered 2012-05-17 (×5): 50 ug via INTRAVENOUS

## 2012-05-17 MED ORDER — DIPHENHYDRAMINE HCL 12.5 MG/5ML PO ELIX: 12.5000 mg | ORAL_SOLUTION | Freq: Four times a day (QID) | ORAL | Status: DC | PRN

## 2012-05-17 MED ORDER — HEPARIN SODIUM (PORCINE) 5000 UNIT/ML IJ SOLN
5000.0000 [IU] | Freq: Three times a day (TID) | INTRAMUSCULAR | Status: DC
  Administered 2012-05-18 – 2012-05-19 (×4): 5000 [IU] via SUBCUTANEOUS
  Filled 2012-05-17 (×7): qty 1

## 2012-05-17 MED ORDER — 0.9 % SODIUM CHLORIDE (POUR BTL) OPTIME
TOPICAL | Status: DC | PRN
  Administered 2012-05-17: 4000 mL

## 2012-05-17 MED ORDER — ALVIMOPAN 12 MG PO CAPS
12.0000 mg | ORAL_CAPSULE | Freq: Once | ORAL | Status: AC
  Administered 2012-05-17: 12 mg via ORAL
  Filled 2012-05-17: qty 1

## 2012-05-17 SURGICAL SUPPLY — 72 items
APPLICATOR COTTON TIP 6IN STRL (MISCELLANEOUS) ×4 IMPLANT
BLADE EXTENDED COATED 6.5IN (ELECTRODE) ×1 IMPLANT
BLADE HEX COATED 2.75 (ELECTRODE) ×3 IMPLANT
BLADE SURG SZ10 CARB STEEL (BLADE) IMPLANT
BRR ADH 5X3 SEPRAFILM 6 SHT (MISCELLANEOUS) ×2
CANISTER SUCTION 2500CC (MISCELLANEOUS) ×3 IMPLANT
CLIP TI LARGE 6 (CLIP) IMPLANT
CLOTH BEACON ORANGE TIMEOUT ST (SAFETY) ×3 IMPLANT
CONT SPECI 4OZ STER CLIK (MISCELLANEOUS) ×1 IMPLANT
COVER MAYO STAND STRL (DRAPES) ×3 IMPLANT
DRAIN CHANNEL 10F 3/8 F FF (DRAIN) IMPLANT
DRAPE INCISE IOBAN 66X45 STRL (DRAPES) IMPLANT
DRAPE LAPAROSCOPIC ABDOMINAL (DRAPES) ×3 IMPLANT
DRAPE LG THREE QUARTER DISP (DRAPES) IMPLANT
DRAPE UTILITY XL STRL (DRAPES) ×1 IMPLANT
DRAPE WARM FLUID 44X44 (DRAPE) ×3 IMPLANT
DRSG PAD ABDOMINAL 8X10 ST (GAUZE/BANDAGES/DRESSINGS) ×1 IMPLANT
ELECT REM PT RETURN 9FT ADLT (ELECTROSURGICAL) ×3
ELECTRODE REM PT RTRN 9FT ADLT (ELECTROSURGICAL) ×2 IMPLANT
ENSEAL DEVICE STD TIP 35CM (ENDOMECHANICALS) ×3 IMPLANT
EVACUATOR DRAINAGE 10X20 100CC (DRAIN) IMPLANT
EVACUATOR SILICONE 100CC (DRAIN) IMPLANT
GAUZE SPONGE 4X4 16PLY XRAY LF (GAUZE/BANDAGES/DRESSINGS) IMPLANT
GLOVE BIOGEL PI IND STRL 7.0 (GLOVE) ×2 IMPLANT
GLOVE BIOGEL PI INDICATOR 7.0 (GLOVE) ×1
GLOVE ECLIPSE 8.0 STRL XLNG CF (GLOVE) ×6 IMPLANT
GLOVE INDICATOR 8.0 STRL GRN (GLOVE) ×6 IMPLANT
GOWN STRL NON-REIN LRG LVL3 (GOWN DISPOSABLE) ×4 IMPLANT
GOWN STRL REIN XL XLG (GOWN DISPOSABLE) ×8 IMPLANT
HAND ACTIVATED (MISCELLANEOUS) ×1 IMPLANT
KIT BASIN OR (CUSTOM PROCEDURE TRAY) ×2 IMPLANT
LEGGING LITHOTOMY PAIR STRL (DRAPES) IMPLANT
LIGASURE IMPACT 36 18CM CVD LR (INSTRUMENTS) ×3 IMPLANT
NS IRRIG 1000ML POUR BTL (IV SOLUTION) ×6 IMPLANT
PACK GENERAL/GYN (CUSTOM PROCEDURE TRAY) ×3 IMPLANT
PAD TELFA 2X3 NADH STRL (GAUZE/BANDAGES/DRESSINGS) IMPLANT
RELOAD AUTO 90-3.5 TA90 BLE (ENDOMECHANICALS) ×3 IMPLANT
RELOAD PROXIMATE 75MM BLUE (ENDOMECHANICALS) ×9 IMPLANT
RELOAD STAPLE 75 3.8 BLU REG (ENDOMECHANICALS) IMPLANT
RELOAD STAPLE 90 BLU REG (ENDOMECHANICALS) IMPLANT
SEPRAFILM PROCEDURAL PACK 3X5 (MISCELLANEOUS) ×1 IMPLANT
SPONGE GAUZE 4X4 12PLY (GAUZE/BANDAGES/DRESSINGS) ×3 IMPLANT
SPONGE LAP 18X18 X RAY DECT (DISPOSABLE) ×1 IMPLANT
STAPLER 90 3.5 STAND SLIM (STAPLE) ×3
STAPLER 90 3.5 STD SLIM (STAPLE) IMPLANT
STAPLER PROXIMATE 75MM BLUE (STAPLE) ×1 IMPLANT
STAPLER VISISTAT 35W (STAPLE) ×3 IMPLANT
SUCTION POOLE TIP (SUCTIONS) ×3 IMPLANT
SUT CHROMIC 0 SH (SUTURE) IMPLANT
SUT CHROMIC 2 0 TIES 18 (SUTURE) IMPLANT
SUT NOV 1 T60/GS (SUTURE) ×5 IMPLANT
SUT NOVA NAB DX-16 0-1 5-0 T12 (SUTURE) IMPLANT
SUT NOVA T20/GS 25 (SUTURE) IMPLANT
SUT PDS AB 1 CTX 36 (SUTURE) ×4 IMPLANT
SUT PDS AB 1 TP1 96 (SUTURE) ×4 IMPLANT
SUT SILK 2 0 (SUTURE) ×3
SUT SILK 2 0 SH CR/8 (SUTURE) ×3 IMPLANT
SUT SILK 2 0SH CR/8 30 (SUTURE) IMPLANT
SUT SILK 2-0 18XBRD TIE 12 (SUTURE) ×2 IMPLANT
SUT SILK 2-0 30XBRD TIE 12 (SUTURE) IMPLANT
SUT SILK 3 0 (SUTURE) ×6
SUT SILK 3 0 SH CR/8 (SUTURE) ×4 IMPLANT
SUT SILK 3-0 18XBRD TIE 12 (SUTURE) ×4 IMPLANT
SUT VIC AB 2-0 SH 18 (SUTURE) IMPLANT
SUT VIC AB 3-0 SH 18 (SUTURE) IMPLANT
SUT VICRYL 2 0 18  UND BR (SUTURE) ×2
SUT VICRYL 2 0 18 UND BR (SUTURE) ×4 IMPLANT
TAPE CLOTH SURG 4X10 WHT LF (GAUZE/BANDAGES/DRESSINGS) ×1 IMPLANT
TOWEL OR 17X26 10 PK STRL BLUE (TOWEL DISPOSABLE) ×6 IMPLANT
TRAY FOLEY CATH 14FRSI W/METER (CATHETERS) ×3 IMPLANT
WATER STERILE IRR 1500ML POUR (IV SOLUTION) IMPLANT
YANKAUER SUCT BULB TIP NO VENT (SUCTIONS) ×3 IMPLANT

## 2012-05-17 NOTE — Transfer of Care (Signed)
Immediate Anesthesia Transfer of Care Note  Patient: [name]  Procedure(s) Performed: Procedure(s) (LRB) with comments: PARTIAL COLECTOMY (N/A) COLOSTOMY CLOSURE (N/A) EXPLORATORY LAPAROTOMY () LYSIS OF ADHESION () BIOPSY () - omental biopsy  Patient Location: PACU  Anesthesia Type:General  Level of Consciousness: awake, alert , oriented and patient cooperative  Airway & Oxygen Therapy: Patient Spontanous Breathing and Patient connected to face mask oxygen  Post-op Assessment: Report given to PACU RN, Post -op Vital signs reviewed and stable and Patient moving all extremities  Post vital signs: Reviewed and stable  Complications: No apparent anesthesia complications

## 2012-05-17 NOTE — Op Note (Addendum)
Operative Note  Ronnald Collum Lint female 62 y.o. 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  Anesthesia: General endotracheal anesthesia  Indications:  This is a 62 year old female who has had right sided ischemic colitis one year ago and recovered from this. In September of 2013 she had left-sided ischemic colitis requiring an emergency exploratory laparotomy, left colectomy, right-sided colostomy, and left lower quadrant mucous fistula. A thorough evaluation has been performed to look for the etiology of her ischemic colitis and there is no obvious anatomic abnormality or vasculitis type cause.` It is felt it is from a lowest flow state from dehydration.  She now presents for the above procedure.   Procedure Detail:  She was brought to the operating room placed supine on the operating table and general anesthetic was administered. She was placed in the lithotomy position. A Foley catheter was inserted. The right upper quadrant stoma and left lower quadrant stoma were both exposed. The abdominal wall was sterilely prepped and draped. The stomas were cleaned with Betadine and a dry sponge followed by Tegaderm were placed over these.  A previous midline scars were incised with the subcutaneous tissue and fascia as well as some mesh that was present in the lower portion divided with cautery. I entered the peritoneal cavity through the epigastric area. There were adhesions between the omentum and abdominal wall which were divided sharply. There were adhesions between the small bowel  and anterior abdominal wall as well as mesh which were divided sharply. Once this was done, the midline abdominal wall was free of omental and small bowel adhesions. There were some adhesions between the small bowel and lateral abdominal wall on the left side were divided sharply. There were some adhesions between the omentum and right lateral abdominal wall which were divided.  An omental nodule was noted and removed.  Frozen section analysis showed that it was benign. There were some adhesions between the distal ileum and posterior abdominal wall which were divided mobilizing it.  The adhesiolysis took approximately one hour.  The right upper quadrant stoma was approached and an elliptical incision made around it. It was dissected free from the surrounding fascia sharply and with electrocautery and reduced back into the abdominal cavity. The stoma was then resected using the GIA stapler. The right colon was immobilized by dividing its lateral attachments. The distal ileum was then divided with the GIA stapler just proximal to the ileocecal junction. The mesentery of the right colon was then divided close to the colon with the LigaSure device. The right colon was then handed off the field.  Next the left lower quadrant mucous fistula was approached. There was some small bowel up in the peristomal hernia. An elliptical incision was made around the left lower quadrant stoma it was mobilized free of the subcutaneous tissue and hernia sac. Adhesions between the small bowel and hernia sac were divided sharply dropping the small bowel back into the abdominal cavity. Once the stone was adequately mobilized it was dropped back into the abdominal cavity. Using the GIA stapler a segment of the sigmoid colon with the stoma  was a resected.  The entire small bowel was inspected and there is no evidence of leak or enterotomy.  The ileum was then approximated to the remaining sigmoid colon and a side to side fashion. A side  to side stapled anastomosis was then performed a GIA stapler. A 3-0 silk suture was placed in the crotch of the anastomosis to relieve any tension. The common defect was then closed with the linear noncutting stapling. The first staple misfired. A second set staple firing was performed which was solid. There is no leak from the staple line. The anastomosis was patent, viable, and under no tension.  Gloves were changed. The abdominal cavity was copiously irrigated with saline solution and inspected. There is no evidence of bleeding or organ injury. The right upper quadrant stoma site fascial incision was then closed with running #1 Novafil suture. The left lower quadrant stoma site and hernia was also closed with running #1 Novafil suture. Following this, Seprafilm was then placed over the intestines in the midline. The midline fascia including the mesh was then closed with a running #1 Novafil suture.  The subcutaneous tissue and the midline incision was irrigated and the skin was closed staples. This 2 stoma sites were packed with saline moistened gauze filled by dry dressing. A sterile dressing was applied to the midline area.  She tolerated the procedures well without any apparent complications and was taken to the recovery room in satisfactory condition.  Estimated Blood Loss:  200 mL         Drains: none  Blood Given: none          Specimens: Right colon, segment of sigmoid colon.        Complications:  * No complications entered in OR log *         Disposition: PACU - hemodynamically stable.         Condition: stable

## 2012-05-17 NOTE — H&P (Signed)
[name] is an 62 y.o. female.   Chief Complaint: Here for elective colostomy reversal HPI: She is status post emergency left colectomy for acute ischemic colitis in September 2013. One year ago she had ischemic right-sided colitis. A thorough evaluation has been done. Arteriogram does not demonstrate any vascular disease. Vasculitis workup is negative. She now presents for a completion colectomy and closure of her right-sided colostomy.  Past Medical History  Diagnosis Date  . Hypertension     She has a past hx of essential  . 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 Inflammation of joint of knee     Since we last last saw her she developed problems with an acute which required surgical drainage by her orthopedist Dr. Cleophas Dunker.  Marland Kitchen MRSA (methicillin resistant Staphylococcus aureus)     Knee surgery drainage was positive for MRSA and she was treated with 3 weeks of doxycycline successfully.  . Diarrhea     Mild  . Exogenous obesity   . GERD (gastroesophageal reflux disease)     2 hosp.- ischemic colitis - residual of Norovirus, 05/2011- sm. bowel obstruction  . Headache     migraine headache on occas, less now than when she was younger   . Arthritis     L hip, back, neck   . History of blood transfusion sept 2013    04/2011- /w ischemic colitis , trouble with matching blood  sept 2013  . Anemia     will see hematology consult prior to surgery, recommended by Dr. Patty Sermons  . Anemia 12/15/2010  . Ischemic colitis 01/31/2012  . Atrial flutter     during hospitalization, 04/2011- related to anemia & illness/stress   . Pneumonia     04/2011- not hospitalized , pt. denies SOB, changes in chest, breathing  . 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 Dizziness     occasional    Past Surgical History  Procedure Date  . Knee surgery     I&D- 2008, post laceration   . Colonoscopy 05/16/2011   Procedure: COLONOSCOPY;  Surgeon: Vertell Novak., MD;  Location: [facility];  Service: Endoscopy;  Laterality: N/A;  . Small intestine surgery 1992, 1999  . Laparotomy and lysis of adhesions   . Total hip arthroplasty 10/25/2011    Procedure: TOTAL HIP ARTHROPLASTY;  Surgeon: Valeria Batman, MD;  Location: [facility] OR;  Service: Orthopedics;  Laterality: Left;  . Appendectomy 1962  . Abdominal hysterectomy 1988  . Partial colectomy and colostomy sept 2013    mucous fistula done    Family History  Problem Relation Age of Onset  . Stroke Father   . Atrial fibrillation Father   . Hypertension Mother    Social History:  reports that she has been smoking Cigarettes.  She has a 5 pack-year smoking history. She has never used smokeless tobacco. She reports that she drinks alcohol. She reports that she does not use illicit drugs.  Allergies:  Allergies  Allergen Reactions  . Vancomycin Hives and Rash    ? wheezing  . Codeine Nausea And Vomiting  . Tetanus Toxoids Other (See Comments)    serum  . Penicillins Hives and Rash  . Xarelto (Rivaroxaban) Hives and Rash    ?    Medications Prior to Admission  Medication Sig Dispense Refill  . aspirin 325 MG EC tablet Take 325 mg by mouth  daily.      . benazepril (LOTENSIN) 20 MG tablet Take 20 mg by mouth daily before breakfast.       . bisoprolol (ZEBETA) 5 MG tablet Take 5 mg by mouth daily before breakfast.      . buPROPion (WELLBUTRIN XL) 300 MG 24 hr tablet Take 300 mg by mouth daily before breakfast.       . celecoxib (CELEBREX) 200 MG capsule Take 200 mg by mouth every morning.      Marland Kitchen esomeprazole (NEXIUM) 40 MG capsule Take 40 mg by mouth every morning.       Marland Kitchen estradiol (ESTRACE) 2 MG tablet Take 2 mg by mouth daily before breakfast.       . hydrochlorothiazide (HYDRODIURIL) 25 MG tablet Take 25 mg by mouth every morning.      . loratadine-pseudoephedrine (CLARITIN-D 12-HOUR) 5-120 MG per tablet Take 1 tablet by mouth 2 (two)  times daily.      . montelukast (SINGULAIR) 10 MG tablet Take 10 mg by mouth as needed. For breathing nasal drainage congestion relief      . ondansetron (ZOFRAN) 4 MG tablet Take 4 mg by mouth every 8 (eight) hours as needed. nausea      . furosemide (LASIX) 20 MG tablet Take 20 mg by mouth as needed.      . hydrocortisone cream 0.5 % Apply topically as needed. As needed to occasional hives on back      . nystatin (MYCOSTATIN) powder Apply topically as needed. To area by left side by breast      . promethazine (PHENERGAN) 25 MG tablet Take 1 tablet (25 mg total) by mouth every 6 (six) hours as needed for nausea.  15 tablet  0    Results for orders placed during the hospital encounter of 05/17/12 (from the past 48 hour(s))  BASIC METABOLIC PANEL     Status: Abnormal   Collection Time   05/17/12  6:40 AM      Component Value Range Comment   Sodium 126 (*) 135 - 145 mEq/L    Potassium 4.6  3.5 - 5.1 mEq/L    Chloride 93 (*) 96 - 112 mEq/L    CO2 22  19 - 32 mEq/L    Glucose, Bld 105 (*) 70 - 99 mg/dL    BUN 14  6 - 23 mg/dL    Creatinine, Ser 4.09  0.50 - 1.10 mg/dL    Calcium 9.0  8.4 - 81.1 mg/dL    GFR calc non Af Amer 76 (*) >90 mL/min    GFR calc Af Amer 88 (*) >90 mL/min   TYPE AND SCREEN     Status: Normal (Preliminary result)   Collection Time   05/17/12  6:40 AM      Component Value Range Comment   ABO/RH(D) A POS      Antibody Screen PENDING      Sample Expiration 05/20/2012      No results found.  Review of Systems  Constitutional: Negative for fever and chills.  HENT: Positive for congestion.   Respiratory: Negative.   Cardiovascular: Negative.   Gastrointestinal: Negative.   Genitourinary: Negative.     Blood pressure 110/77, pulse 83, resp. rate 16. Physical Exam  Constitutional: No distress.       Overweight female.  HENT:  Head: Normocephalic and atraumatic.  Neck: Neck supple.  Cardiovascular: Normal rate and regular rhythm.   Respiratory: Effort normal  and breath sounds normal.  GI: Soft.  She exhibits no distension. There is no tenderness.       Long midline scar with areas of weakness. Right upper quadrant colostomy. The left lower quadrant mucous fistula with some bulging around it.  Musculoskeletal: She exhibits no edema.  Lymphadenopathy:    She has no cervical adenopathy.     Assessment/Plan Right upper quadrant colostomy and left lower quadrant mucous fistula probably emergency left colectomy for ischemic colitis. Has a history of right sided ischemic colitis as well.  Plan: Completion colectomy and colostomy reversal. The procedure, risks, and after care have been discussed with her preoperatively.  Renaldo Gornick J 05/17/2012, 7:30 AM

## 2012-05-17 NOTE — Preoperative (Signed)
Beta Blockers   Reason not to administer Beta Blockers:bisoprolol taken 05-18-11 0445

## 2012-05-17 NOTE — Anesthesia Postprocedure Evaluation (Signed)
  Anesthesia Post-op Note  Patient: [name]  Procedure(s) Performed: Procedure(s) (LRB): PARTIAL COLECTOMY (N/A) COLOSTOMY CLOSURE (N/A) EXPLORATORY LAPAROTOMY () LYSIS OF ADHESION () BIOPSY ()  Patient Location: PACU  Anesthesia Type: General  Level of Consciousness: awake and alert   Airway and Oxygen Therapy: Patient Spontanous Breathing  Post-op Pain: mild  Post-op Assessment: Post-op Vital signs reviewed, Patient's Cardiovascular Status Stable, Respiratory Function Stable, Patent Airway and No signs of Nausea or vomiting  Last Vitals:  Filed Vitals:   05/17/12 1145  BP: 125/69  Pulse: 57  Temp: 35.6 C  Resp: 8    Post-op Vital Signs: stable   Complications: No apparent anesthesia complications

## 2012-05-17 NOTE — Progress Notes (Signed)
Peripherally Inserted Central Catheter/Midline Placement  The IV Nurse has discussed with the patient and/or persons authorized to consent for the patient, the purpose of this procedure and the potential benefits and risks involved with this procedure.  The benefits include less needle sticks, lab draws from the catheter and patient may be discharged home with the catheter.  Risks include, but not limited to, infection, bleeding, blood clot (thrombus formation), and puncture of an artery; nerve damage and irregular heat beat.  Alternatives to this procedure were also discussed.  PICC/Midline Placement Documentation        Timmothy Sours 05/17/2012, 6:05 PM

## 2012-05-18 ENCOUNTER — Encounter (HOSPITAL_COMMUNITY): Payer: Self-pay | Admitting: General Surgery

## 2012-05-18 LAB — BASIC METABOLIC PANEL
BUN: 10 mg/dL (ref 6–23)
CO2: 21 mEq/L (ref 19–32)
Calcium: 7.8 mg/dL — ABNORMAL LOW (ref 8.4–10.5)
Creatinine, Ser: 0.61 mg/dL (ref 0.50–1.10)
GFR calc non Af Amer: >90 mL/min (ref >90)
Glucose, Bld: 142 mg/dL — ABNORMAL HIGH (ref 70–99)

## 2012-05-18 LAB — BLOOD GAS, ARTERIAL
Acid-base deficit: 4.7 mmol/L — ABNORMAL HIGH (ref 0.0–2.0)
O2 Content: 21 L/min
O2 Saturation: 92.4 %
Patient temperature: 98.6
pO2, Arterial: 67.6 mmHg — ABNORMAL LOW (ref 80.0–100.0)

## 2012-05-18 LAB — CBC
MCH: 32.9 pg (ref 26.0–34.0)
MCHC: 34.7 g/dL (ref 30.0–36.0)
MCV: 94.8 fL (ref 78.0–100.0)
Platelets: 253 10*3/uL (ref 150–400)
RBC: 2.89 MIL/uL — ABNORMAL LOW (ref 3.87–5.11)
RDW: 12.9 % (ref 11.5–15.5)

## 2012-05-18 MED ORDER — MORPHINE SULFATE 2 MG/ML IJ SOLN
2.0000 mg | INTRAMUSCULAR | Status: DC | PRN
  Administered 2012-05-18 (×4): 4 mg via INTRAVENOUS
  Administered 2012-05-18: 2 mg via INTRAVENOUS
  Administered 2012-05-18 – 2012-05-19 (×3): 4 mg via INTRAVENOUS
  Administered 2012-05-19: 2 mg via INTRAVENOUS
  Administered 2012-05-19: 4 mg via INTRAVENOUS
  Administered 2012-05-19: 2 mg via INTRAVENOUS
  Administered 2012-05-19: 4 mg via INTRAVENOUS
  Administered 2012-05-19: 2 mg via INTRAVENOUS
  Administered 2012-05-19: 4 mg via INTRAVENOUS
  Administered 2012-05-20: 2 mg via INTRAVENOUS
  Administered 2012-05-20 (×3): 4 mg via INTRAVENOUS
  Administered 2012-05-20: 2 mg via INTRAVENOUS
  Administered 2012-05-20 – 2012-05-21 (×4): 4 mg via INTRAVENOUS
  Administered 2012-05-21: 2 mg via INTRAVENOUS
  Administered 2012-05-21 (×5): 4 mg via INTRAVENOUS
  Filled 2012-05-18: qty 1
  Filled 2012-05-18 (×3): qty 2
  Filled 2012-05-18: qty 1
  Filled 2012-05-18 (×8): qty 2
  Filled 2012-05-18: qty 1
  Filled 2012-05-18 (×3): qty 2
  Filled 2012-05-18: qty 1
  Filled 2012-05-18 (×5): qty 2
  Filled 2012-05-18: qty 1
  Filled 2012-05-18 (×5): qty 2

## 2012-05-18 MED ORDER — KETOROLAC TROMETHAMINE 30 MG/ML IJ SOLN
30.0000 mg | Freq: Four times a day (QID) | INTRAMUSCULAR | Status: AC
  Administered 2012-05-18 – 2012-05-19 (×4): 30 mg via INTRAVENOUS
  Filled 2012-05-18 (×4): qty 1

## 2012-05-18 NOTE — Progress Notes (Signed)
1 Day Post-Op  Subjective: Oxygen saturation machine connected to PCA not working well so she did not sleep much.  Pain is controlled.  Minimal nausea.  Objective: Vital signs in last 24 hours: Temp:  [96.1 F (35.6 C)-98.4 F (36.9 C)] 98.4 F (36.9 C) (01/31 0555) Pulse Rate:  [54-109] 109  (01/31 0555) Resp:  [7-20] 14  (01/31 0555) BP: (72-145)/(43-88) 120/75 mmHg (01/31 0555) SpO2:  [92 %-100 %] 93 % (01/31 0555) FiO2 (%):  [40 %] 40 % (01/31 0037) Weight:  [166 lb (75.297 kg)] 166 lb (75.297 kg) (01/30 1349) Last BM Date: 05/16/12  Intake/Output from previous day: 01/30 0701 - 01/31 0700 In: 5358.3 [I.V.:5358.3] Out: 1600 [Urine:1475; Blood:125] Intake/Output this shift: Total I/O In: 1500 [I.V.:1500] Out: 650 [Urine:650]  PE: General- In NAD CV-increased rate Lungs-coarse Abdomen-soft, quiet, dressings dry  Lab Results:   Basename 05/18/12 0600  WBC 8.9  HGB 9.5*  HCT 27.4*  PLT 253   BMET  Basename 05/18/12 0600 05/17/12 0640  NA 125* 126*  K 4.5 4.6  CL 94* 93*  CO2 21 22  GLUCOSE 142* 105*  BUN 10 14  CREATININE 0.61 0.81  CALCIUM 7.8* 9.0   PT/INR No results found for this basename: LABPROT:2,INR:2 in the last 72 hours Comprehensive Metabolic Panel:    Component Value Date/Time   NA 125* 05/18/2012 0600   NA 130* 02/09/2012 1321   K 4.5 05/18/2012 0600   K 4.6 02/09/2012 1321   CL 94* 05/18/2012 0600   CL 101 02/09/2012 1321   CO2 21 05/18/2012 0600   CO2 18* 02/09/2012 1321   BUN 10 05/18/2012 0600   BUN 21.0 02/09/2012 1321   CREATININE 0.61 05/18/2012 0600   CREATININE 0.8 02/09/2012 1321   GLUCOSE 142* 05/18/2012 0600   GLUCOSE 79 02/09/2012 1321   CALCIUM 7.8* 05/18/2012 0600   CALCIUM 9.2 02/09/2012 1321   AST 35 05/10/2012 1140   AST 53* 02/09/2012 1321   ALT 37* 05/10/2012 1140   ALT 56* 02/09/2012 1321   ALKPHOS 496* 05/10/2012 1140   ALKPHOS 712* 02/09/2012 1321   BILITOT 0.3 05/10/2012 1140   BILITOT 0.50 02/09/2012 1321   PROT 8.0 05/10/2012 1140   PROT 7.0 02/09/2012 1321   ALBUMIN 3.6 05/10/2012 1140   ALBUMIN 3.8 02/09/2012 1321     Studies/Results: No results found.  Anti-infectives: Anti-infectives     Start     Dose/Rate Route Frequency Ordered Stop   05/17/12 2000   cefoTEtan (CEFOTAN) 2 g in dextrose 5 % 50 mL IVPB        2 g 100 mL/hr over 30 Minutes Intravenous Every 12 hours 05/17/12 1312 05/17/12 2114   05/17/12 0606   cefOXitin (MEFOXIN) 2 g in dextrose 5 % 50 mL IVPB        2 g 100 mL/hr over 30 Minutes Intravenous On call to O.R. 05/17/12 0606 05/17/12 0751          Assessment Active Problems:  Chronic ischemic colitis s/p reversal of colostomy, partial colectomy, takedown of mucous fistula-difficulty with oxygen saturation machine  Chronic anemia with ABL  Chronic hyponatremia  CMML (chronic myelomonocytic leukemia)    LOS: 1 day   Plan: OOB.  Clear liquids.  Will work on oxygen saturation machine.   Domenick Quebedeaux J 05/18/2012

## 2012-05-18 NOTE — Progress Notes (Signed)
Nutrition Brief Note  Patient identified on the Malnutrition Screening Tool (MST) Report  Body mass index is 27.62 kg/(m^2). Patient meets criteria for overweight based on current BMI.   Wt Readings from Last 10 Encounters:  05/17/12 166 lb (75.297 kg)  05/17/12 166 lb (75.297 kg)  05/10/12 166 lb 12.8 oz (75.66 kg)  03/20/12 166 lb (75.297 kg)  02/21/12 163 lb (73.936 kg)  02/09/12 166 lb 11.2 oz (75.615 kg)  02/07/12 163 lb 12.8 oz (74.299 kg)  01/30/12 165 lb (74.844 kg)  01/11/12 183 lb (83.008 kg)  11/17/11 178 lb 9.6 oz (81.012 kg)    Pt reports eating well PTA, small frequent meals. Pt with 17 pound weight loss since September 2013 however pt reports having at least 30 pounds of fluid on her when that September weight was recorded. Pt's weight stable in the past 3 months. Pt POD# 1 colostomy reversal. Pt tolerating clear liquid diet. Diet likely to be advanced soon.   No nutrition interventions warranted at this time. If nutrition issues arise, please consult RD.   Levon Hedger MS, RD, LDN [phone] Pager [phone] After Hours Pager

## 2012-05-18 NOTE — Care Management Note (Addendum)
    Page 1 of 1   05/18/2012     11:09:00 AM   CARE MANAGEMENT NOTE 05/18/2012  Patient:  QUORRA, ROSENE   Account Number:  [id number]  Date Initiated:  05/18/2012  Documentation initiated by:  Lorenda Ishihara  Subjective/Objective Assessment:   62 yo female admitted s/p Exploratory laparotomy, lysis of adhesions, closure of right colostomy and right colectomy, closure of left lower quadrant mucous fistula, Ileosigmoid anastomosis, repair of hernia. PTA lived at home with dtr     Action/Plan:   Home when stable   Anticipated DC Date:  05/21/2012   Anticipated DC Plan:  HOME/SELF CARE      DC Planning Services  CM consult      Choice offered to / List presented to:             Status of service:  Completed, signed off Medicare Important Message given?   (If response is "NO", the following Medicare IM given date fields will be blank) Date Medicare IM given:   Date Additional Medicare IM given:    Discharge Disposition:  HOME/SELF CARE  Per UR Regulation:  Reviewed for med. necessity/level of care/duration of stay  If discussed at Long Length of Stay Meetings, dates discussed:    Comments:

## 2012-05-19 ENCOUNTER — Inpatient Hospital Stay (HOSPITAL_COMMUNITY): Payer: BC Managed Care – PPO

## 2012-05-19 LAB — CBC
HCT: 23 % — ABNORMAL LOW (ref 36.0–46.0)
Hemoglobin: 8 g/dL — ABNORMAL LOW (ref 12.0–15.0)
MCHC: 34.8 g/dL (ref 30.0–36.0)
MCV: 95.6 fL (ref 78.0–100.0)
Platelets: 218 10*3/uL (ref 150–400)
RBC: 2.48 MIL/uL — ABNORMAL LOW (ref 3.87–5.11)
RBC: 2.42 MIL/uL — ABNORMAL LOW (ref 3.87–5.11)
WBC: 10.3 10*3/uL (ref 4.0–10.5)

## 2012-05-19 LAB — BASIC METABOLIC PANEL
CO2: 19 mEq/L (ref 19–32)
Calcium: 7.6 mg/dL — ABNORMAL LOW (ref 8.4–10.5)
Chloride: 95 mEq/L — ABNORMAL LOW (ref 96–112)
GFR calc Af Amer: >90 mL/min (ref >90)
Sodium: 124 mEq/L — ABNORMAL LOW (ref 135–145)

## 2012-05-19 NOTE — Progress Notes (Signed)
2 Days Post-Op  Subjective: A little short of breath today.  Had some liquid BMs with dark blood in them.  Coughing some.  Objective: Vital signs in last 24 hours: Temp:  [98 F (36.7 C)-99.6 F (37.6 C)] 99.6 F (37.6 C) (02/01 0609) Pulse Rate:  [102-111] 110  (02/01 0609) Resp:  [18-20] 20  (02/01 0609) BP: (117-131)/(76-83) 131/83 mmHg (02/01 0609) SpO2:  [92 %-94 %] 92 % (02/01 0609) Last BM Date: 05/16/12  Intake/Output from previous day: 01/31 0701 - 02/01 0700 In: 2664.9 [P.O.:120; I.V.:2542.9; IV Piggyback:2] Out: 1225 [Urine:1225] Intake/Output this shift:    PE: General- In NAD CV-increased rate Lungs-decreased breath sounds in bases. Abdomen-soft, quiet, all wounds are clean, RUQ and LLQ open wounds packed with saline moistened gauze and dry dressing applied.  Lab Results:   Basename 05/19/12 0509 05/18/12 0600  WBC 10.3 8.9  HGB 8.1* 9.5*  HCT 23.7* 27.4*  PLT 218 253   BMET  Basename 05/19/12 0509 05/18/12 0600  NA 124* 125*  K 4.7 4.5  CL 95* 94*  CO2 19 21  GLUCOSE 118* 142*  BUN 15 10  CREATININE 0.72 0.61  CALCIUM 7.6* 7.8*   PT/INR No results found for this basename: LABPROT:2,INR:2 in the last 72 hours Comprehensive Metabolic Panel:    Component Value Date/Time   NA 124* 05/19/2012 0509   NA 130* 02/09/2012 1321   K 4.7 05/19/2012 0509   K 4.6 02/09/2012 1321   CL 95* 05/19/2012 0509   CL 101 02/09/2012 1321   CO2 19 05/19/2012 0509   CO2 18* 02/09/2012 1321   BUN 15 05/19/2012 0509   BUN 21.0 02/09/2012 1321   CREATININE 0.72 05/19/2012 0509   CREATININE 0.8 02/09/2012 1321   GLUCOSE 118* 05/19/2012 0509   GLUCOSE 79 02/09/2012 1321   CALCIUM 7.6* 05/19/2012 0509   CALCIUM 9.2 02/09/2012 1321   AST 35 05/10/2012 1140   AST 53* 02/09/2012 1321   ALT 37* 05/10/2012 1140   ALT 56* 02/09/2012 1321   ALKPHOS 496* 05/10/2012 1140   ALKPHOS 712* 02/09/2012 1321   BILITOT 0.3 05/10/2012 1140   BILITOT 0.50 02/09/2012 1321   PROT 8.0 05/10/2012  1140   PROT 7.0 02/09/2012 1321   ALBUMIN 3.6 05/10/2012 1140   ALBUMIN 3.8 02/09/2012 1321     Studies/Results: No results found.  Anti-infectives: Anti-infectives     Start     Dose/Rate Route Frequency Ordered Stop   05/17/12 2000   cefoTEtan (CEFOTAN) 2 g in dextrose 5 % 50 mL IVPB        2 g 100 mL/hr over 30 Minutes Intravenous Every 12 hours 05/17/12 1312 05/17/12 2114   05/17/12 0606   cefOXitin (MEFOXIN) 2 g in dextrose 5 % 50 mL IVPB        2 g 100 mL/hr over 30 Minutes Intravenous On call to O.R. 05/17/12 0606 05/17/12 0751          Assessment Active Problems:  Chronic ischemic colitis s/p reversal of colostomy, partial colectomy, takedown of mucous fistula-oxygenation a little better; mildly dyspnea; some dark bloody stools  Chronic anemia with ABL-hemoglobin down to 8.1; has needed postoperative transfusions in the past per her report.  Chronic hyponatremia-persists despite NS  CMML (chronic myelomonocytic leukemia)    LOS: 2 days   Plan:  Stop Heparin.  Check CXR for volume overload.  Repeat CBC later today and tomorrow.  Decrease IVF.  Jerelene Salaam J 05/19/2012

## 2012-05-20 LAB — CBC
HCT: 21.3 % — ABNORMAL LOW (ref 36.0–46.0)
Hemoglobin: 7.4 g/dL — ABNORMAL LOW (ref 12.0–15.0)
MCH: 33.5 pg (ref 26.0–34.0)
MCV: 96.4 fL (ref 78.0–100.0)
Platelets: 212 10*3/uL (ref 150–400)
RBC: 2.21 MIL/uL — ABNORMAL LOW (ref 3.87–5.11)
WBC: 8 10*3/uL (ref 4.0–10.5)

## 2012-05-20 LAB — BASIC METABOLIC PANEL
BUN: 10 mg/dL (ref 6–23)
CO2: 18 mEq/L — ABNORMAL LOW (ref 19–32)
Calcium: 7.8 mg/dL — ABNORMAL LOW (ref 8.4–10.5)
Chloride: 95 mEq/L — ABNORMAL LOW (ref 96–112)
Creatinine, Ser: 0.68 mg/dL (ref 0.50–1.10)
Glucose, Bld: 106 mg/dL — ABNORMAL HIGH (ref 70–99)

## 2012-05-20 NOTE — Progress Notes (Signed)
3 Days Post-Op  Subjective: Some dizziness while walking.  Not as short of breath today.  Has post nasal drip and a cough.  Having liquid BMs (green), not bloody.    Objective: Vital signs in last 24 hours: Temp:  [99.3 F (37.4 C)-102.1 F (38.9 C)] 99.3 F (37.4 C) (02/02 0530) Pulse Rate:  [108-121] 108  (02/02 0530) Resp:  [18-22] 20  (02/02 0530) BP: (137-168)/(82-92) 148/82 mmHg (02/02 0530) SpO2:  [93 %-100 %] 100 % (02/02 0530) Last BM Date: 05/20/12  Intake/Output from previous day: 02/01 0701 - 02/02 0700 In: 2960 [P.O.:960; I.V.:2000] Out: -  Intake/Output this shift:    PE: General- In NAD CV- slightly increased rate Lungs-coarse breath sounds on right Abdomen-soft, few bowel sounds, all wounds are clean, RUQ and LLQ open wounds packed with saline moistened gauze and dry dressing applied.  Lab Results:   Basename 05/20/12 0500 05/19/12 1512  WBC 8.0 10.3  HGB 7.4* 8.0*  HCT 21.3* 23.0*  PLT 212 204   BMET  Basename 05/20/12 0500 05/19/12 0509  NA 123* 124*  K 4.3 4.7  CL 95* 95*  CO2 18* 19  GLUCOSE 106* 118*  BUN 10 15  CREATININE 0.68 0.72  CALCIUM 7.8* 7.6*   PT/INR No results found for this basename: LABPROT:2,INR:2 in the last 72 hours Comprehensive Metabolic Panel:    Component Value Date/Time   NA 123* 05/20/2012 0500   NA 130* 02/09/2012 1321   K 4.3 05/20/2012 0500   K 4.6 02/09/2012 1321   CL 95* 05/20/2012 0500   CL 101 02/09/2012 1321   CO2 18* 05/20/2012 0500   CO2 18* 02/09/2012 1321   BUN 10 05/20/2012 0500   BUN 21.0 02/09/2012 1321   CREATININE 0.68 05/20/2012 0500   CREATININE 0.8 02/09/2012 1321   GLUCOSE 106* 05/20/2012 0500   GLUCOSE 79 02/09/2012 1321   CALCIUM 7.8* 05/20/2012 0500   CALCIUM 9.2 02/09/2012 1321   AST 35 05/10/2012 1140   AST 53* 02/09/2012 1321   ALT 37* 05/10/2012 1140   ALT 56* 02/09/2012 1321   ALKPHOS 496* 05/10/2012 1140   ALKPHOS 712* 02/09/2012 1321   BILITOT 0.3 05/10/2012 1140   BILITOT 0.50  02/09/2012 1321   PROT 8.0 05/10/2012 1140   PROT 7.0 02/09/2012 1321   ALBUMIN 3.6 05/10/2012 1140   ALBUMIN 3.8 02/09/2012 1321     Studies/Results: Dg Chest 2 View  05/19/2012  *RADIOLOGY REPORT*  Clinical Data: Shortness of breath, postop  CHEST - 2 VIEW  Comparison: 05/10/2012  Findings: Low lung volumes with vascular crowding.  No frank interstitial edema.  Small bilateral pleural effusions.  Associated bibasilar opacities, likely atelectasis.  The heart is normal in size.  Mild degenerative changes of the visualized thoracolumbar spine.  Right arm PICC terminates at the cavoatrial junction.  IMPRESSION: No frank interstitial edema.  Small bilateral pleural effusions.  Associated bibasilar opacities, likely atelectasis.   Original Report Authenticated By: Charline Bills, M.D.     Anti-infectives: Anti-infectives     Start     Dose/Rate Route Frequency Ordered Stop   05/17/12 2000   cefoTEtan (CEFOTAN) 2 g in dextrose 5 % 50 mL IVPB        2 g 100 mL/hr over 30 Minutes Intravenous Every 12 hours 05/17/12 1312 05/17/12 2114   05/17/12 0606   cefOXitin (MEFOXIN) 2 g in dextrose 5 % 50 mL IVPB        2 g 100 mL/hr over  30 Minutes Intravenous On call to O.R. 05/17/12 0606 05/17/12 0751          Assessment Active Problems:  Chronic ischemic colitis s/p reversal of colostomy, partial colectomy, takedown of mucous fistula-dyspnea better; no more dark, bloody stools  Chronic anemia with ABL-hemoglobin down to 7.4, she is symptomatic and has a hx of CHF in the past.  Chronic hyponatremia-persists despite NS  CMML (chronic myelomonocytic leukemia)  Atelectasis on CXR yesterday-has increase IS   LOS: 3 days   Plan:  Transfuse 2 units PRBCs-she is in agreement.  Full liquids.  Nilza Eaker J 05/20/2012

## 2012-05-21 LAB — CBC
Hemoglobin: 9.4 g/dL — ABNORMAL LOW (ref 12.0–15.0)
MCH: 31.9 pg (ref 26.0–34.0)
MCHC: 34.7 g/dL (ref 30.0–36.0)
MCV: 91.9 fL (ref 78.0–100.0)
RBC: 2.95 MIL/uL — ABNORMAL LOW (ref 3.87–5.11)

## 2012-05-21 LAB — TYPE AND SCREEN: Antibody Screen: POSITIVE

## 2012-05-21 LAB — BASIC METABOLIC PANEL
CO2: 18 mEq/L — ABNORMAL LOW (ref 19–32)
Calcium: 7.6 mg/dL — ABNORMAL LOW (ref 8.4–10.5)
Creatinine, Ser: 0.61 mg/dL (ref 0.50–1.10)
GFR calc non Af Amer: >90 mL/min (ref >90)
Glucose, Bld: 106 mg/dL — ABNORMAL HIGH (ref 70–99)
Sodium: 124 mEq/L — ABNORMAL LOW (ref 135–145)

## 2012-05-21 MED ORDER — LOPERAMIDE HCL 2 MG PO CAPS
4.0000 mg | ORAL_CAPSULE | Freq: Two times a day (BID) | ORAL | Status: DC
  Administered 2012-05-21 (×2): 4 mg via ORAL
  Filled 2012-05-21 (×4): qty 2

## 2012-05-21 MED ORDER — HYDROMORPHONE HCL PF 1 MG/ML IJ SOLN
1.0000 mg | INTRAMUSCULAR | Status: DC | PRN
  Administered 2012-05-21 – 2012-05-22 (×6): 1 mg via INTRAVENOUS
  Filled 2012-05-21 (×6): qty 1

## 2012-05-21 MED ORDER — TAB-A-VITE/IRON PO TABS
1.0000 | ORAL_TABLET | Freq: Every day | ORAL | Status: DC
  Administered 2012-05-21 – 2012-06-06 (×14): 1 via ORAL
  Filled 2012-05-21 (×17): qty 1

## 2012-05-21 MED ORDER — LORAZEPAM 2 MG/ML IJ SOLN
1.0000 mg | Freq: Four times a day (QID) | INTRAMUSCULAR | Status: DC | PRN
  Administered 2012-05-25: 1 mg via INTRAVENOUS
  Filled 2012-05-21: qty 1

## 2012-05-21 MED ORDER — HYDRALAZINE HCL 20 MG/ML IJ SOLN
10.0000 mg | INTRAMUSCULAR | Status: DC | PRN
  Administered 2012-05-21 – 2012-05-25 (×4): 10 mg via INTRAVENOUS
  Filled 2012-05-21 (×4): qty 1

## 2012-05-21 MED ORDER — ZINC OXIDE 40 % EX OINT
TOPICAL_OINTMENT | CUTANEOUS | Status: DC | PRN
  Administered 2012-05-21: 1 via TOPICAL
  Filled 2012-05-21: qty 114

## 2012-05-21 MED ORDER — PANTOPRAZOLE SODIUM 40 MG PO TBEC
40.0000 mg | DELAYED_RELEASE_TABLET | Freq: Every day | ORAL | Status: DC
  Administered 2012-05-21 – 2012-05-22 (×2): 40 mg via ORAL
  Filled 2012-05-21 (×2): qty 1

## 2012-05-21 NOTE — Progress Notes (Signed)
This patient is receiving IV Protonix. Based on criteria approved by the Pharmacy and Therapeutics Committee, this medication is being converted to the equivalent oral dose form. These criteria include:   . The patient is eating (either orally or per tube) and/or has been taking other orally administered medications for at least 24 hours.  . This patient has no evidence of active gastrointestinal bleeding or impaired GI absorption (gastrectomy, short bowel, patient on TNA or NPO).   If you have questions about this conversion, please contact the pharmacy department.  [name] [place], [place] 05/21/2012 10:08 AM

## 2012-05-21 NOTE — Progress Notes (Signed)
4 Days Post-Op  Subjective: Multiple loose BMs.  Anal area is sore.  Tolerating liquids.   Objective: Vital signs in last 24 hours: Temp:  [98.2 F (36.8 C)-100 F (37.8 C)] 98.6 F (37 C) (02/03 0630) Pulse Rate:  [94-99] 95  (02/03 0630) Resp:  [20-22] 20  (02/03 0630) BP: (154-170)/(81-100) 158/81 mmHg (02/03 0630) Last BM Date: 05/20/12  Intake/Output from previous day: 02/02 0701 - 02/03 0700 In: 3337.5 [P.O.:600; I.V.:1837.5; Blood:900] Out: 850 [Urine:850] Intake/Output this shift:    PE: General- In NAD CV- slightly increased rate Lungs-coarse breath sounds on right Abdomen-soft, few bowel sounds, midline wound with slight redness in the midportion-staples removed and nonodorous fluid evacuated, RUQ and LLQ open wounds clean and packed with saline moistened gauze and dry dressing applied.  Lab Results:   Basename 05/21/12 0500 05/20/12 0500  WBC 8.5 8.0  HGB 9.4* 7.4*  HCT 27.1* 21.3*  PLT 203 212   BMET  Basename 05/21/12 0500 05/20/12 0500  NA 124* 123*  K 3.8 4.3  CL 95* 95*  CO2 18* 18*  GLUCOSE 106* 106*  BUN 7 10  CREATININE 0.61 0.68  CALCIUM 7.6* 7.8*   PT/INR No results found for this basename: LABPROT:2,INR:2 in the last 72 hours Comprehensive Metabolic Panel:    Component Value Date/Time   NA 124* 05/21/2012 0500   NA 130* 02/09/2012 1321   K 3.8 05/21/2012 0500   K 4.6 02/09/2012 1321   CL 95* 05/21/2012 0500   CL 101 02/09/2012 1321   CO2 18* 05/21/2012 0500   CO2 18* 02/09/2012 1321   BUN 7 05/21/2012 0500   BUN 21.0 02/09/2012 1321   CREATININE 0.61 05/21/2012 0500   CREATININE 0.8 02/09/2012 1321   GLUCOSE 106* 05/21/2012 0500   GLUCOSE 79 02/09/2012 1321   CALCIUM 7.6* 05/21/2012 0500   CALCIUM 9.2 02/09/2012 1321   AST 35 05/10/2012 1140   AST 53* 02/09/2012 1321   ALT 37* 05/10/2012 1140   ALT 56* 02/09/2012 1321   ALKPHOS 496* 05/10/2012 1140   ALKPHOS 712* 02/09/2012 1321   BILITOT 0.3 05/10/2012 1140   BILITOT 0.50 02/09/2012 1321    PROT 8.0 05/10/2012 1140   PROT 7.0 02/09/2012 1321   ALBUMIN 3.6 05/10/2012 1140   ALBUMIN 3.8 02/09/2012 1321     Studies/Results: Dg Chest 2 View  05/19/2012  *RADIOLOGY REPORT*  Clinical Data: Shortness of breath, postop  CHEST - 2 VIEW  Comparison: 05/10/2012  Findings: Low lung volumes with vascular crowding.  No frank interstitial edema.  Small bilateral pleural effusions.  Associated bibasilar opacities, likely atelectasis.  The heart is normal in size.  Mild degenerative changes of the visualized thoracolumbar spine.  Right arm PICC terminates at the cavoatrial junction.  IMPRESSION: No frank interstitial edema.  Small bilateral pleural effusions.  Associated bibasilar opacities, likely atelectasis.   Original Report Authenticated By: Charline Bills, M.D.     Anti-infectives: Anti-infectives     Start     Dose/Rate Route Frequency Ordered Stop   05/17/12 2000   cefoTEtan (CEFOTAN) 2 g in dextrose 5 % 50 mL IVPB        2 g 100 mL/hr over 30 Minutes Intravenous Every 12 hours 05/17/12 1312 05/17/12 2114   05/17/12 0606   cefOXitin (MEFOXIN) 2 g in dextrose 5 % 50 mL IVPB        2 g 100 mL/hr over 30 Minutes Intravenous On call to O.R. 05/17/12 1610 05/17/12 9604  Assessment Active Problems:  Chronic ischemic colitis s/p reversal of colostomy, partial colectomy, takedown of mucous fistula-having frequent BMs now  Chronic anemia with ABL-hemoglobin up to 9.4 Chronic hyponatremia-persists despite NS  CMML (chronic myelomonocytic leukemia)  Atelectasis on CXR -oxygenation improved   LOS: 4 days   Plan:  High fiber diet.  MVI with Iron. Start Imodium. Dressing changes.  [name] 05/21/2012

## 2012-05-21 NOTE — Progress Notes (Signed)
Dr Corliss Skains notified of patient experiencing elevated blood pressure and rapid heart rate.  Dr Corliss Skains wrote new orders.

## 2012-05-22 ENCOUNTER — Encounter (HOSPITAL_COMMUNITY): Payer: Self-pay | Admitting: Certified Registered"

## 2012-05-22 ENCOUNTER — Inpatient Hospital Stay (HOSPITAL_COMMUNITY): Payer: BC Managed Care – PPO

## 2012-05-22 ENCOUNTER — Other Ambulatory Visit (HOSPITAL_COMMUNITY): Payer: BC Managed Care – PPO

## 2012-05-22 ENCOUNTER — Inpatient Hospital Stay (HOSPITAL_COMMUNITY): Payer: BC Managed Care – PPO | Admitting: Certified Registered"

## 2012-05-22 ENCOUNTER — Encounter (HOSPITAL_COMMUNITY)
Admission: RE | Disposition: A | Discharge status: Home Health | Payer: Self-pay | Source: Ambulatory Visit | Attending: General Surgery

## 2012-05-22 DIAGNOSIS — Z433 Encounter for attention to colostomy: Secondary | ICD-10-CM

## 2012-05-22 DIAGNOSIS — IMO0002 Reserved for concepts with insufficient information to code with codable children: Secondary | ICD-10-CM

## 2012-05-22 DIAGNOSIS — K929 Disease of digestive system, unspecified: Secondary | ICD-10-CM

## 2012-05-22 HISTORY — PX: LAPAROTOMY: SHX154

## 2012-05-22 LAB — BASIC METABOLIC PANEL
Calcium: 7.7 mg/dL — ABNORMAL LOW (ref 8.4–10.5)
Creatinine, Ser: 0.6 mg/dL (ref 0.50–1.10)
GFR calc Af Amer: >90 mL/min (ref >90)
GFR calc non Af Amer: >90 mL/min (ref >90)
Sodium: 125 mEq/L — ABNORMAL LOW (ref 135–145)

## 2012-05-22 LAB — CBC
MCH: 32.4 pg (ref 26.0–34.0)
MCHC: 34.9 g/dL (ref 30.0–36.0)
MCV: 92.6 fL (ref 78.0–100.0)
Platelets: 254 10*3/uL (ref 150–400)
RDW: 14.4 % (ref 11.5–15.5)

## 2012-05-22 LAB — MAGNESIUM: Magnesium: 0.7 mg/dL — CL (ref 1.5–2.5)

## 2012-05-22 SURGERY — LAPAROTOMY, EXPLORATORY
Anesthesia: General | Site: Abdomen | Wound class: Dirty or Infected

## 2012-05-22 MED ORDER — MEPERIDINE HCL 50 MG/ML IJ SOLN: 6.2500 mg | INTRAMUSCULAR | Status: DC | PRN

## 2012-05-22 MED ORDER — FENTANYL CITRATE 0.05 MG/ML IJ SOLN: 25.0000 ug | INTRAMUSCULAR | Status: DC | PRN

## 2012-05-22 MED ORDER — DILTIAZEM HCL 25 MG/5ML IV SOLN
INTRAVENOUS | Status: AC
  Filled 2012-05-22: qty 5

## 2012-05-22 MED ORDER — SUCCINYLCHOLINE CHLORIDE 20 MG/ML IJ SOLN
INTRAMUSCULAR | Status: DC | PRN
  Administered 2012-05-22: 100 mg via INTRAVENOUS

## 2012-05-22 MED ORDER — PHENYLEPHRINE HCL 10 MG/ML IJ SOLN
30.0000 ug/min | INTRAVENOUS | Status: DC
  Filled 2012-05-22: qty 1

## 2012-05-22 MED ORDER — NEOSTIGMINE METHYLSULFATE 1 MG/ML IJ SOLN
INTRAMUSCULAR | Status: DC | PRN
  Administered 2012-05-22: 5 mg via INTRAVENOUS

## 2012-05-22 MED ORDER — PROMETHAZINE HCL 25 MG/ML IJ SOLN: 6.2500 mg | INTRAMUSCULAR | Status: DC | PRN

## 2012-05-22 MED ORDER — 0.9 % SODIUM CHLORIDE (POUR BTL) OPTIME
TOPICAL | Status: DC | PRN
  Administered 2012-05-22: 3000 mL

## 2012-05-22 MED ORDER — ROCURONIUM BROMIDE 100 MG/10ML IV SOLN
INTRAVENOUS | Status: DC | PRN
  Administered 2012-05-22: 10 mg via INTRAVENOUS
  Administered 2012-05-22: 20 mg via INTRAVENOUS

## 2012-05-22 MED ORDER — LACTATED RINGERS IV SOLN
INTRAVENOUS | Status: DC | PRN
  Administered 2012-05-22 (×3): via INTRAVENOUS

## 2012-05-22 MED ORDER — DILTIAZEM HCL 100 MG IV SOLR
5.0000 mg/h | INTRAVENOUS | Status: DC
  Administered 2012-05-22: 5 mg/h via INTRAVENOUS
  Administered 2012-05-22: 10 mg/h via INTRAVENOUS
  Administered 2012-05-23 – 2012-05-24 (×2): 5 mg/h via INTRAVENOUS
  Filled 2012-05-22 (×2): qty 100

## 2012-05-22 MED ORDER — MENTHOL 3 MG MT LOZG
1.0000 | LOZENGE | OROMUCOSAL | Status: DC | PRN
  Filled 2012-05-22: qty 9

## 2012-05-22 MED ORDER — ONDANSETRON HCL 4 MG/2ML IJ SOLN
INTRAMUSCULAR | Status: DC | PRN
  Administered 2012-05-22: 10 mg via INTRAVENOUS

## 2012-05-22 MED ORDER — SODIUM CHLORIDE 0.9 % IV SOLN
1.0000 g | INTRAVENOUS | Status: DC
  Administered 2012-05-22 – 2012-05-30 (×9): 1 g via INTRAVENOUS
  Filled 2012-05-22 (×9): qty 1

## 2012-05-22 MED ORDER — HYDROMORPHONE HCL PF 1 MG/ML IJ SOLN
0.5000 mg | INTRAMUSCULAR | Status: DC | PRN
  Administered 2012-05-22 – 2012-05-30 (×47): 1 mg via INTRAVENOUS
  Filled 2012-05-22 (×46): qty 1

## 2012-05-22 MED ORDER — IOHEXOL 350 MG/ML SOLN
100.0000 mL | Freq: Once | INTRAVENOUS | Status: AC | PRN
  Administered 2012-05-22: 100 mL via INTRAVENOUS

## 2012-05-22 MED ORDER — LACTATED RINGERS IV SOLN
INTRAVENOUS | Status: DC | PRN
  Administered 2012-05-22 (×2): via INTRAVENOUS

## 2012-05-22 MED ORDER — MIDAZOLAM HCL 5 MG/5ML IJ SOLN
INTRAMUSCULAR | Status: DC | PRN
  Administered 2012-05-22: 2 mg via INTRAVENOUS

## 2012-05-22 MED ORDER — SODIUM CHLORIDE 0.9 % IV BOLUS (SEPSIS)
500.0000 mL | Freq: Once | INTRAVENOUS | Status: AC
  Administered 2012-05-22: 500 mL via INTRAVENOUS

## 2012-05-22 MED ORDER — DEXAMETHASONE SODIUM PHOSPHATE 10 MG/ML IJ SOLN
INTRAMUSCULAR | Status: DC | PRN
  Administered 2012-05-22: 10 mg via INTRAVENOUS

## 2012-05-22 MED ORDER — MAGNESIUM SULFATE 40 MG/ML IJ SOLN
2.0000 g | Freq: Once | INTRAMUSCULAR | Status: AC
  Administered 2012-05-22: 2 g via INTRAVENOUS
  Filled 2012-05-22: qty 50

## 2012-05-22 MED ORDER — HYDROMORPHONE HCL PF 1 MG/ML IJ SOLN: 0.5000 mg | Freq: Two times a day (BID) | INTRAMUSCULAR | Status: DC | PRN

## 2012-05-22 MED ORDER — DEXTROSE IN LACTATED RINGERS 5 % IV SOLN
INTRAVENOUS | Status: AC
  Administered 2012-05-22 – 2012-05-23 (×3): via INTRAVENOUS
  Administered 2012-05-23: 125 mL/h via INTRAVENOUS
  Administered 2012-05-24: 1000 mL via INTRAVENOUS

## 2012-05-22 MED ORDER — LACTATED RINGERS IV SOLN: INTRAVENOUS | Status: DC

## 2012-05-22 MED ORDER — ENOXAPARIN SODIUM 40 MG/0.4ML ~~LOC~~ SOLN
40.0000 mg | SUBCUTANEOUS | Status: DC
  Administered 2012-05-23 – 2012-06-05 (×14): 40 mg via SUBCUTANEOUS
  Filled 2012-05-22 (×15): qty 0.4

## 2012-05-22 MED ORDER — DILTIAZEM LOAD VIA INFUSION
10.0000 mg | Freq: Once | INTRAVENOUS | Status: AC
  Administered 2012-05-22: 10 mg via INTRAVENOUS
  Filled 2012-05-22: qty 10

## 2012-05-22 MED ORDER — FENTANYL CITRATE 0.05 MG/ML IJ SOLN
INTRAMUSCULAR | Status: DC | PRN
Start: 2012-05-22 — End: 2012-05-22
  Administered 2012-05-22: 50 ug via INTRAVENOUS
  Administered 2012-05-22: 25 ug via INTRAVENOUS
  Administered 2012-05-22: 50 ug via INTRAVENOUS
  Administered 2012-05-22: 25 ug via INTRAVENOUS
  Administered 2012-05-22 (×2): 50 ug via INTRAVENOUS
  Administered 2012-05-22 (×2): 25 ug via INTRAVENOUS
  Administered 2012-05-22: 100 ug via INTRAVENOUS
  Administered 2012-05-22: 50 ug via INTRAVENOUS

## 2012-05-22 MED ORDER — PROPOFOL 10 MG/ML IV BOLUS
INTRAVENOUS | Status: DC | PRN
  Administered 2012-05-22: 150 mg via INTRAVENOUS

## 2012-05-22 MED ORDER — PHENYLEPHRINE HCL 10 MG/ML IJ SOLN
INTRAMUSCULAR | Status: DC | PRN
  Administered 2012-05-22: 160 ug via INTRAVENOUS

## 2012-05-22 MED ORDER — IOHEXOL 300 MG/ML  SOLN: 25.0000 mL | INTRAMUSCULAR | Status: DC

## 2012-05-22 MED ORDER — GLYCOPYRROLATE 0.2 MG/ML IJ SOLN
INTRAMUSCULAR | Status: DC | PRN
  Administered 2012-05-22: 0.6 mg via INTRAVENOUS

## 2012-05-22 MED ORDER — HYDROMORPHONE HCL PF 1 MG/ML IJ SOLN
INTRAMUSCULAR | Status: AC
  Filled 2012-05-22: qty 2

## 2012-05-22 MED ORDER — PHENYLEPHRINE HCL 10 MG/ML IJ SOLN
20.0000 mg | INTRAVENOUS | Status: DC | PRN
  Administered 2012-05-22: 150 ug/min via INTRAVENOUS
  Administered 2012-05-22: 250 ug/min via INTRAVENOUS

## 2012-05-22 MED ORDER — HYDROMORPHONE HCL PF 1 MG/ML IJ SOLN
INTRAMUSCULAR | Status: AC
  Administered 2012-05-22: 1 mg via INTRAVENOUS
  Filled 2012-05-22: qty 1

## 2012-05-22 SURGICAL SUPPLY — 58 items
APPLICATOR COTTON TIP 6IN STRL (MISCELLANEOUS) ×2 IMPLANT
BANDAGE GAUZE ELAST BULKY 4 IN (GAUZE/BANDAGES/DRESSINGS) ×2 IMPLANT
BARRIER SKIN 2 3/4 (OSTOMY) ×2 IMPLANT
BARRIER SKIN OD2.25 2 3/4 FLNG (OSTOMY) IMPLANT
BLADE EXTENDED COATED 6.5IN (ELECTRODE) ×1 IMPLANT
BLADE HEX COATED 2.75 (ELECTRODE) ×2 IMPLANT
BRR ADH 5X3 SEPRAFILM 6 SHT (MISCELLANEOUS) ×1
BRR SKN FLT 2.75X2.25 2 PC (OSTOMY) ×1
CANISTER SUCTION 2500CC (MISCELLANEOUS) ×1 IMPLANT
CATH ROBINSON RED A/P 14FR (CATHETERS) ×1 IMPLANT
CLOTH BEACON ORANGE TIMEOUT ST (SAFETY) ×2 IMPLANT
COVER MAYO STAND STRL (DRAPES) ×1 IMPLANT
DRAIN CHANNEL 19F RND (DRAIN) ×1 IMPLANT
DRAPE LAPAROSCOPIC ABDOMINAL (DRAPES) ×2 IMPLANT
DRAPE UTILITY XL STRL (DRAPES) ×2 IMPLANT
DRAPE WARM FLUID 44X44 (DRAPE) IMPLANT
DRSG PAD ABDOMINAL 8X10 ST (GAUZE/BANDAGES/DRESSINGS) ×2 IMPLANT
ELECT REM PT RETURN 9FT ADLT (ELECTROSURGICAL) ×2
ELECTRODE REM PT RTRN 9FT ADLT (ELECTROSURGICAL) ×1 IMPLANT
EVACUATOR DRAINAGE 7X20 100CC (MISCELLANEOUS) IMPLANT
EVACUATOR SILICONE 100CC (MISCELLANEOUS) ×2
GLOVE BIOGEL M 8.0 STRL (GLOVE) ×2 IMPLANT
GLOVE BIOGEL PI IND STRL 7.0 (GLOVE) ×1 IMPLANT
GLOVE BIOGEL PI INDICATOR 7.0 (GLOVE) ×1
GLOVE ECLIPSE 8.0 STRL XLNG CF (GLOVE) ×3 IMPLANT
GLOVE INDICATOR 8.0 STRL GRN (GLOVE) ×3 IMPLANT
GOWN STRL NON-REIN LRG LVL3 (GOWN DISPOSABLE) ×2 IMPLANT
GOWN STRL REIN XL XLG (GOWN DISPOSABLE) ×5 IMPLANT
KIT BASIN OR (CUSTOM PROCEDURE TRAY) ×2 IMPLANT
MANIFOLD NEPTUNE II (INSTRUMENTS) ×1 IMPLANT
NS IRRIG 1000ML POUR BTL (IV SOLUTION) ×4 IMPLANT
PACK GENERAL/GYN (CUSTOM PROCEDURE TRAY) ×2 IMPLANT
POUCH OSTOMY 2 3/4  H 3804 (WOUND CARE) ×1
POUCH OSTOMY 2 3/4 H 3804 (WOUND CARE) ×1
POUCH OSTOMY 2 PC DRNBL 2.75 (WOUND CARE) IMPLANT
SEPRAFILM PROCEDURAL PACK 3X5 (MISCELLANEOUS) ×1 IMPLANT
SPONGE DRAIN TRACH 4X4 STRL 2S (GAUZE/BANDAGES/DRESSINGS) ×2 IMPLANT
SPONGE GAUZE 4X4 12PLY (GAUZE/BANDAGES/DRESSINGS) ×2 IMPLANT
SPONGE LAP 18X18 X RAY DECT (DISPOSABLE) IMPLANT
STAPLER VISISTAT 35W (STAPLE) ×2 IMPLANT
SUCTION POOLE TIP (SUCTIONS) ×1 IMPLANT
SUT ETHILON 3 0 PS 1 (SUTURE) ×2 IMPLANT
SUT NOV 1 T60/GS (SUTURE) ×1 IMPLANT
SUT NOVA 1 T20/GS 25DT (SUTURE) ×1 IMPLANT
SUT PDS AB 1 CTX 36 (SUTURE) ×2 IMPLANT
SUT SILK 2 0 (SUTURE) ×2
SUT SILK 2 0 SH CR/8 (SUTURE) ×1 IMPLANT
SUT SILK 2-0 18XBRD TIE 12 (SUTURE) IMPLANT
SUT SILK 3 0 (SUTURE)
SUT SILK 3 0 SH CR/8 (SUTURE) ×1 IMPLANT
SUT SILK 3-0 18XBRD TIE 12 (SUTURE) IMPLANT
SUT VIC AB 3-0 SH 8-18 (SUTURE) ×3 IMPLANT
SUT VICRYL 2 0 18  UND BR (SUTURE) ×1
SUT VICRYL 2 0 18 UND BR (SUTURE) IMPLANT
TAPE CLOTH SURG 4X10 WHT LF (GAUZE/BANDAGES/DRESSINGS) ×1 IMPLANT
TOWEL OR 17X26 10 PK STRL BLUE (TOWEL DISPOSABLE) ×4 IMPLANT
TRAY FOLEY CATH 14FRSI W/METER (CATHETERS) IMPLANT
YANKAUER SUCT BULB TIP NO VENT (SUCTIONS) ×1 IMPLANT

## 2012-05-22 NOTE — Transfer of Care (Signed)
Immediate Anesthesia Transfer of Care Note  Patient: [name]  Procedure(s) Performed: Procedure(s) (LRB) with comments: EXPLORATORY LAPAROTOMY (N/A) - DRAINAGE  INTRA-ABDOMINAL ABSCESS/LYSIS OF ADHESIONSFOR SMALL BOWEL OBSTRUCTION/DIVERTING LOOP ILEOSTOMY  Patient Location: PACU  Anesthesia Type:General  Level of Consciousness: awake, alert , oriented, patient cooperative and responds to stimulation  Airway & Oxygen Therapy: Patient Spontanous Breathing and Patient connected to face mask oxygen  Post-op Assessment: Report given to PACU RN, Post -op Vital signs reviewed and stable and Patient moving all extremities  Post vital signs: Reviewed and stable  Complications: No apparent anesthesia complications

## 2012-05-22 NOTE — Consult Note (Signed)
WOC ostomy consult  Patient seen for preoperative stoma site selection on an emergent basis.  Patient has had recent takedown of a colostomy (RUQ) and mucus fistula (LLQ).  Patient has had pouching difficulties in the past relative to stoma site placement as she cannot wear her underwear or jeans comfortably.  Site selected is 6cm to the left of the umbilicus and 1.5cm below.  It is near to the margins of her existing midline abdominal wound but within the rectus muscle.  As this wound heals, patient will have a decreased likelihood of complications if this site can be selected.  Rationale explained to RN and to patient and sister.  Site is marked with a skin marking pen and covered with a thin film transparent dressing. I will not follow. If ostomy is created, please order WOC nursing services. Thanks, Ladona Mow, MSN, RN, [facility], CWOCN [phone])

## 2012-05-22 NOTE — Progress Notes (Signed)
5 Days Post-Op  Subjective: Pain level unchanged from yesterday.  Fewer BMs.  Some distension and nausea.  Mild dyspnea.  Objective: Vital signs in last 24 hours: Temp:  [98.1 F (36.7 C)-99.4 F (37.4 C)] 99.4 F (37.4 C) (02/04 0547) Pulse Rate:  [100-144] 144  (02/04 0547) Resp:  [18-20] 20  (02/04 0547) BP: (144-169)/(84-111) 144/96 mmHg (02/04 0547) SpO2:  [98 %] 98 % (02/04 0547) Last BM Date: 05/21/12  Intake/Output from previous day: 02/03 0701 - 02/04 0700 In: 2160 [P.O.:360; I.V.:1800] Out: 500 [Urine:500] Intake/Output this shift:    PE: General- In NAD CV-  increased rate, rhythm sounds regular Abdomen-soft, hypoactive bowel sounds, lower aspect of midline wound red with purulent drainage-all staples removed and wound packed with saline moistened gauze, former stoma sites demonstrate granulation tissue  Lab Results:   Basename 05/22/12 0551 05/21/12 0500  WBC 11.7* 8.5  HGB 11.0* 9.4*  HCT 31.5* 27.1*  PLT 254 203   BMET  Basename 05/21/12 0500 05/20/12 0500  NA 124* 123*  K 3.8 4.3  CL 95* 95*  CO2 18* 18*  GLUCOSE 106* 106*  BUN 7 10  CREATININE 0.61 0.68  CALCIUM 7.6* 7.8*   PT/INR No results found for this basename: LABPROT:2,INR:2 in the last 72 hours Comprehensive Metabolic Panel:    Component Value Date/Time   NA 124* 05/21/2012 0500   NA 130* 02/09/2012 1321   K 3.8 05/21/2012 0500   K 4.6 02/09/2012 1321   CL 95* 05/21/2012 0500   CL 101 02/09/2012 1321   CO2 18* 05/21/2012 0500   CO2 18* 02/09/2012 1321   BUN 7 05/21/2012 0500   BUN 21.0 02/09/2012 1321   CREATININE 0.61 05/21/2012 0500   CREATININE 0.8 02/09/2012 1321   GLUCOSE 106* 05/21/2012 0500   GLUCOSE 79 02/09/2012 1321   CALCIUM 7.6* 05/21/2012 0500   CALCIUM 9.2 02/09/2012 1321   AST 35 05/10/2012 1140   AST 53* 02/09/2012 1321   ALT 37* 05/10/2012 1140   ALT 56* 02/09/2012 1321   ALKPHOS 496* 05/10/2012 1140   ALKPHOS 712* 02/09/2012 1321   BILITOT 0.3 05/10/2012 1140   BILITOT  0.50 02/09/2012 1321   PROT 8.0 05/10/2012 1140   PROT 7.0 02/09/2012 1321   ALBUMIN 3.6 05/10/2012 1140   ALBUMIN 3.8 02/09/2012 1321     Studies/Results: No results found.  Anti-infectives: Anti-infectives     Start     Dose/Rate Route Frequency Ordered Stop   05/17/12 2000   cefoTEtan (CEFOTAN) 2 g in dextrose 5 % 50 mL IVPB        2 g 100 mL/hr over 30 Minutes Intravenous Every 12 hours 05/17/12 1312 05/17/12 2114   05/17/12 0606   cefOXitin (MEFOXIN) 2 g in dextrose 5 % 50 mL IVPB        2 g 100 mL/hr over 30 Minutes Intravenous On call to O.R. 05/17/12 0606 05/17/12 0751          Assessment Active Problems:  Chronic ischemic colitis s/p reversal of colostomy, partial colectomy, takedown of mucous fistula-has developed an ileus  Tachycardia  Low grade fever and leukocytosis-may be from wound infection but need to rule out anastomotic leak or intraabdominal abscess  Chronic anemia with ABL-hemoglobin up to 11 Chronic hyponatremia-persists   CMML (chronic myelomonocytic leukemia)     LOS: 5 days   Plan:  NPO.  Stat CT scan.  Start empiric Invanz.  Transfer to stepdown.  Simmone Cape J 05/22/2012

## 2012-05-22 NOTE — Op Note (Signed)
Operative Note  [name] female 62 y.o. 05/22/2012  PREOPERATIVE DX:  1.  Small bowel obstruction.  2. Intra-abdominal abscess.  3.  Possible anastomotic leak.  POSTOPERATIVE DX:  1.  Small bowel obstruction secondary to adhesion.  2.  Intra-abdominal abscess.  3.  Small (1-2 mm) anastomotic leak.  PROCEDURE:  Exploratory laparotomy, lysis of adhesions for small bowel obstruction, drainage of intra-abdominal abscess, oversewing of anastomotic leak, loop ileostomy.         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  She is progressing well and tolerating a diet as well as having bowel movements. The evening between the fourth and fifth postoperative day she started developing some significant tachycardia.  Her pain level was unchanged. She started having a low-grade fever and a slight elevation of her white blood cell count. She underwent a CT scan which demonstrated a small bowel obstruction in the mid-jejunal area, a pelvic abscess, and a suspicion of a small anastomotic leak. For the above reasons, she is brought to the operating room.    Procedure Detail:  She was brought to the operative room placed supine on the operating table and general anesthetic was administered. The bandages on the abdominal wall were removed. The abdominal wall and open wounds were sterilely prepped and draped.  The running midline closure suture was cut and then peeled back to the proximal and distal ends of the incision. Upon entering the abdominal cavity clear non-foul-smelling fluid was evacuated consistent with postoperative ascites. There was some  significantly dilated proximal small bowel. I found a transition zone where there was an early adhesion leading to a obstruction. This was lysed relieving the obstruction. I milked back some of the small bowel contents into the stomach and this was evacuated by way of the NG tube.  I then went down to the pelvis and noted a slightly foul-smelling abscess cavity which I drained. I was able to bring the anastomosis up into the wound but could not see an obvious hole. Subsequently, a rigid proctoscopy scope was placed in the rectum and air was insufflated and the small bowel was occluded proximal to the anastomosis. The anastomosis was placed under water. At first the anastomosis was airtight. However with more insufflation a tiny pinhole leak was noted in the right aspect of the common defect closure. Further inspection of the area demonstrated a small area of ischemia.  I subsequently closed this with interrupted silk sutures and retested it and it did not leak. The rest of the anastomosis looked viable.  The abdominal cavity was copiously irrigated with saline and the fluid evacuated. A stab wound was made in the right lower quadrant. A drain was placed into the pelvis near the anastomosis (#19 Blake round) anchored to the skin with 3-0 nylon suture.  I decided to perform a loop ileostomy to protect the anastomosis. The previous right upper quadrant colostomy and the fascia was reopened by removing the Novafil suture. A loop of ileum was then brought up through this area. The fascia was then closed around the loop ileostomy with interrupted #1 Novafil sutures. A small defect was made in the mesentery and a 14 red rubber catheter  passed through the defect creating an ileostomy bar. This was anchored to the skin with 3-0 nylon suture.  Subsequently 2 pieces of Seprafilm were placed in the midline. The midline fascia was then closed with a running #1 Novafil suture. Needle sponge and instrument counts were  reported to be correct. The loop ileostomy was then matured with interrupted 3-0 Vicryl sutures. The left lower quadrant open wound in the open midline subcutaneous wound with then packed with saline moistened gauze for by dry dressing. A stoma appliance was applied. The drain was hooked up to bulb suction.  She tolerated the procedure well. She was in atrial fibrillation before the operation which was rate controlled with a Cardizem drip during the operation. She was taken to the recovery room in satisfactory condition and then will be transferred back to the stepdown unit.  A cardiology consultation has been requested for help with the atrial fibrillation.  Estimated Blood Loss:  less than 100 mL         Drains: JACKSON-PRATT (JP)  Blood Given: none          Specimens: none        Complications:  * No complications entered in OR log *         Disposition: PACU - hemodynamically stable.         Condition: stable

## 2012-05-22 NOTE — Anesthesia Preprocedure Evaluation (Signed)
Anesthesia Evaluation  Patient identified by MRN, date of birth, ID band Patient awake    Reviewed: Allergy & Precautions, H&P , NPO status , Patient's Chart, lab work & pertinent test results, reviewed documented beta blocker date and time   History of Anesthesia Complications (+) AWARENESS UNDER ANESTHESIA  Airway Mallampati: II TM Distance: >3 FB Neck ROM: full    Dental No notable dental hx. (+) Teeth Intact and Dental Advisory Given   Pulmonary Current Smoker,  breath sounds clear to auscultation  Pulmonary exam normal       Cardiovascular Exercise Tolerance: Good hypertension, Pt. on medications + dysrhythmias Atrial Fibrillation Rhythm:regular Rate:Normal     Neuro/Psych negative neurological ROS  negative psych ROS   GI/Hepatic negative GI ROS, Neg liver ROS, GERD-  Medicated and Controlled,  Endo/Other  negative endocrine ROS  Renal/GU negative Renal ROS  negative genitourinary   Musculoskeletal   Abdominal   Peds  Hematology  (+) Blood dyscrasia, anemia , Hgb. 10.7   Anesthesia Other Findings   Reproductive/Obstetrics negative OB ROS                           Anesthesia Physical  Anesthesia Plan  ASA: III and emergent  Anesthesia Plan: General   Post-op Pain Management:    Induction: Intravenous  Airway Management Planned: Oral ETT  Additional Equipment:   Intra-op Plan:   Post-operative Plan: Extubation in OR  Informed Consent: I have reviewed the patients History and Physical, chart, labs and discussed the procedure including the risks, benefits and alternatives for the proposed anesthesia with the patient or authorized representative who has indicated his/her understanding and acceptance.   Dental Advisory Given  Plan Discussed with: CRNA and Surgeon  Anesthesia Plan Comments: (Load cardizem for rate control)        Anesthesia Quick Evaluation

## 2012-05-22 NOTE — Progress Notes (Signed)
5 Days Post-Op  Subjective: Having more LLQ pain.  Objective: Vital signs in last 24 hours: Temp:  [98.1 F (36.7 C)-99.4 F (37.4 C)] 99.4 F (37.4 C) (02/04 0547) Pulse Rate:  [100-145] 145  (02/04 1125) Resp:  [18-20] 20  (02/04 0547) BP: (144-169)/(84-111) 166/101 mmHg (02/04 1125) SpO2:  [98 %] 98 % (02/04 0547) Weight:  [189 lb 9.5 oz (86 kg)] 189 lb 9.5 oz (86 kg) (02/04 1125) Last BM Date: 05/21/12  Intake/Output from previous day: 02/03 0701 - 02/04 0700 In: 2160 [P.O.:360; I.V.:1800] Out: 700 [Urine:700] Intake/Output this shift: Total I/O In: 672.9 [I.V.:160.4; IV Piggyback:512.5] Out: 300 [Urine:300]  PE: General- In NAD Abdomen-soft, tenderness and guarding in LLQ  Lab Results:   Basename 05/22/12 0551 05/21/12 0500  WBC 11.7* 8.5  HGB 11.0* 9.4*  HCT 31.5* 27.1*  PLT 254 203   BMET  Basename 05/22/12 0627 05/21/12 0500  NA 125* 124*  K 3.4* 3.8  CL 96 95*  CO2 18* 18*  GLUCOSE 126* 106*  BUN 5* 7  CREATININE 0.60 0.61  CALCIUM 7.7* 7.6*   PT/INR No results found for this basename: LABPROT:2,INR:2 in the last 72 hours Comprehensive Metabolic Panel:    Component Value Date/Time   NA 125* 05/22/2012 0627   NA 130* 02/09/2012 1321   K 3.4* 05/22/2012 0627   K 4.6 02/09/2012 1321   CL 96 05/22/2012 0627   CL 101 02/09/2012 1321   CO2 18* 05/22/2012 0627   CO2 18* 02/09/2012 1321   BUN 5* 05/22/2012 0627   BUN 21.0 02/09/2012 1321   CREATININE 0.60 05/22/2012 0627   CREATININE 0.8 02/09/2012 1321   GLUCOSE 126* 05/22/2012 0627   GLUCOSE 79 02/09/2012 1321   CALCIUM 7.7* 05/22/2012 0627   CALCIUM 9.2 02/09/2012 1321   AST 35 05/10/2012 1140   AST 53* 02/09/2012 1321   ALT 37* 05/10/2012 1140   ALT 56* 02/09/2012 1321   ALKPHOS 496* 05/10/2012 1140   ALKPHOS 712* 02/09/2012 1321   BILITOT 0.3 05/10/2012 1140   BILITOT 0.50 02/09/2012 1321   PROT 8.0 05/10/2012 1140   PROT 7.0 02/09/2012 1321   ALBUMIN 3.6 05/10/2012 1140   ALBUMIN 3.8 02/09/2012 1321      Studies/Results: Ct Angio Chest Pe W/cm &/or Wo Cm  05/22/2012  *RADIOLOGY REPORT*  Clinical Data:  Shortness of breath, cough, evaluate for PE. Abdominal pain, history of right colectomy, evaluate for abscess or anastomotic leak.  CT ANGIOGRAPHY CHEST CT ABDOMEN AND PELVIS WITH CONTRAST  Technique:  Multidetector CT imaging of the chest was performed using the standard protocol during bolus administration of intravenous contrast.  Multiplanar CT image reconstructions including MIPs were obtained to evaluate the vascular anatomy. Multidetector CT imaging of the abdomen and pelvis was performed using the standard protocol during bolus administration of intravenous contrast.  Contrast: OMNIPAQUE IOHEXOL 350 MG/ML SOLN  Comparison:  CT abdomen pelvis dated 02/17/2012  CTA CHEST  Findings:  No evidence of pulmonary embolism.  Patchy bilateral lower lobe opacities, favored to reflect atelectasis.  Small right pleural effusion.  No pneumothorax.  Visualized thyroid is unremarkable.  The heart is top normal in size.  No pericardial effusion. Coronary atherosclerosis.  Atherosclerotic calcifications of the aortic arch.  Right arm PICC.  Distal esophagus is distended/fluid-filled, possibly reflecting gastroesophageal reflux or esophageal dysmotility.   Review of the MIP images confirms the above findings.  IMPRESSION: No evidence of pulmonary embolism.  Patchy bilateral lower lobe opacities, favored  to reflect atelectasis.  Small right pleural effusion.  CT ABDOMEN AND PELVIS  Findings: Liver, spleen, pancreas, and adrenal glands are unremarkable.  Gallbladder is notable for pericholecystic fluid/ascites but is otherwise unremarkable.  No intrahepatic or extrahepatic ductal dilatation.  Kidneys are within normal limits.  No hydronephrosis.  Prior right hemicolectomy with interval ileosigmoid side-to-side anastomosis.  Reversal of the prior right lower quadrant colostomy and left lower quadrant mucous fistula.   Dilated loops of proximal small bowel with focal transition in the left mid abdomen (series 8/image 45).  Mild swirling of decompressed small bowel in the mid abdomen (series 8/image 56). The distal small bowel is decompressed.  Few scattered foci of nondependent gas, possibly postsurgical. However, there are a few additional foci of gas in the central abdomen near the anastomosis (for example, series 8/image 63), which raise the possibility of an anastomotic leak.  6.0 x 11.3 cm partially loculated fluid collection in the lower abdomen with surrounding thin enhancing rim (series 8/image 72), worrisome for developing abscess.  Additional 2.2 x 1.3 cm developing abscess in the right lower pelvis (series 8/image 72). Additional fluid/ascites in the upper abdomen.  Atherosclerotic calcifications of the abdominal aorta and branch vessels.  No suspicious abdominopelvic lymphadenopathy.  Status post hysterectomy.  No adnexal masses.  Bladder is within normal limits.  1.6 cm probable left of breath lungs gland cyst (series 8/image 87).  Degenerative changes of the visualized thoracolumbar spine.  Grade II anterolisthesis of L4 on L5.  Left total hip arthroplasty.  Review of the MIP images confirms the above findings.  IMPRESSION: Prior right hemicolectomy is interval ileosigmoid side-to-side anastomosis.  Dilated loops of proximal small bowel with focal transition in the left mid abdomen, worrisome for small bowel obstruction, possibly on the basis of adhesions or internal hernia.  6.0 x 11.3 cm partially loculated fluid collection in the lower abdomen, worrisome for developing abscess.  Additional 2.2 x 1.3 cm developing abscess in the right lower pelvis.  Scattered foci of gas in the central abdomen near the anastomosis, raising the possibility of anastomotic leak.  These results were called by telephone on 05/22/2012 at 1135 hours to Dr Avel Peace, who verbally acknowledged these results.   Original Report  Authenticated By: Charline Bills, M.D.    Ct Abdomen Pelvis W Contrast  05/22/2012  *RADIOLOGY REPORT*  Clinical Data:  Shortness of breath, cough, evaluate for PE. Abdominal pain, history of right colectomy, evaluate for abscess or anastomotic leak.  CT ANGIOGRAPHY CHEST CT ABDOMEN AND PELVIS WITH CONTRAST  Technique:  Multidetector CT imaging of the chest was performed using the standard protocol during bolus administration of intravenous contrast.  Multiplanar CT image reconstructions including MIPs were obtained to evaluate the vascular anatomy. Multidetector CT imaging of the abdomen and pelvis was performed using the standard protocol during bolus administration of intravenous contrast.  Contrast: OMNIPAQUE IOHEXOL 350 MG/ML SOLN  Comparison:  CT abdomen pelvis dated 02/17/2012  CTA CHEST  Findings:  No evidence of pulmonary embolism.  Patchy bilateral lower lobe opacities, favored to reflect atelectasis.  Small right pleural effusion.  No pneumothorax.  Visualized thyroid is unremarkable.  The heart is top normal in size.  No pericardial effusion. Coronary atherosclerosis.  Atherosclerotic calcifications of the aortic arch.  Right arm PICC.  Distal esophagus is distended/fluid-filled, possibly reflecting gastroesophageal reflux or esophageal dysmotility.   Review of the MIP images confirms the above findings.  IMPRESSION: No evidence of pulmonary embolism.  Patchy bilateral lower lobe  opacities, favored to reflect atelectasis.  Small right pleural effusion.  CT ABDOMEN AND PELVIS  Findings: Liver, spleen, pancreas, and adrenal glands are unremarkable.  Gallbladder is notable for pericholecystic fluid/ascites but is otherwise unremarkable.  No intrahepatic or extrahepatic ductal dilatation.  Kidneys are within normal limits.  No hydronephrosis.  Prior right hemicolectomy with interval ileosigmoid side-to-side anastomosis.  Reversal of the prior right lower quadrant colostomy and left lower quadrant  mucous fistula.  Dilated loops of proximal small bowel with focal transition in the left mid abdomen (series 8/image 45).  Mild swirling of decompressed small bowel in the mid abdomen (series 8/image 56). The distal small bowel is decompressed.  Few scattered foci of nondependent gas, possibly postsurgical. However, there are a few additional foci of gas in the central abdomen near the anastomosis (for example, series 8/image 63), which raise the possibility of an anastomotic leak.  6.0 x 11.3 cm partially loculated fluid collection in the lower abdomen with surrounding thin enhancing rim (series 8/image 72), worrisome for developing abscess.  Additional 2.2 x 1.3 cm developing abscess in the right lower pelvis (series 8/image 72). Additional fluid/ascites in the upper abdomen.  Atherosclerotic calcifications of the abdominal aorta and branch vessels.  No suspicious abdominopelvic lymphadenopathy.  Status post hysterectomy.  No adnexal masses.  Bladder is within normal limits.  1.6 cm probable left of breath lungs gland cyst (series 8/image 87).  Degenerative changes of the visualized thoracolumbar spine.  Grade II anterolisthesis of L4 on L5.  Left total hip arthroplasty.  Review of the MIP images confirms the above findings.  IMPRESSION: Prior right hemicolectomy is interval ileosigmoid side-to-side anastomosis.  Dilated loops of proximal small bowel with focal transition in the left mid abdomen, worrisome for small bowel obstruction, possibly on the basis of adhesions or internal hernia.  6.0 x 11.3 cm partially loculated fluid collection in the lower abdomen, worrisome for developing abscess.  Additional 2.2 x 1.3 cm developing abscess in the right lower pelvis.  Scattered foci of gas in the central abdomen near the anastomosis, raising the possibility of anastomotic leak.  These results were called by telephone on 05/22/2012 at 1135 hours to Dr Avel Peace, who verbally acknowledged these results.    Original Report Authenticated By: Charline Bills, M.D.     Anti-infectives: Anti-infectives     Start     Dose/Rate Route Frequency Ordered Stop   05/22/12 0800   ertapenem (INVANZ) 1 g in sodium chloride 0.9 % 50 mL IVPB        1 g 100 mL/hr over 30 Minutes Intravenous Every 24 hours 05/22/12 0733     05/17/12 2000   cefoTEtan (CEFOTAN) 2 g in dextrose 5 % 50 mL IVPB        2 g 100 mL/hr over 30 Minutes Intravenous Every 12 hours 05/17/12 1312 05/17/12 2114   05/17/12 0606   cefOXitin (MEFOXIN) 2 g in dextrose 5 % 50 mL IVPB        2 g 100 mL/hr over 30 Minutes Intravenous On call to O.R. 05/17/12 0606 05/17/12 0751          Assessment Postop abscess and high suspicion of anastomotic leak.  LOS: 5 days   Plan: To OR for exploratory laparotomy, drainage of abscess, possible ileostomy.  Procedure and risks explained to her.   [name] J 05/22/2012

## 2012-05-22 NOTE — Preoperative (Signed)
Beta Blockers   Reason not to administer Beta Blockers:Not Applicable, not on home BB 

## 2012-05-22 NOTE — Anesthesia Procedure Notes (Signed)
Procedure Name: Intubation Date/Time: 05/22/2012 3:15 PM Performed by: Randon Goldsmith [name] Pre-anesthesia Checklist: Patient identified, Emergency Drugs available, Suction available and Patient being monitored Patient Re-evaluated:Patient Re-evaluated prior to inductionOxygen Delivery Method: Circle system utilized Intubation Type: IV induction, Rapid sequence and Cricoid Pressure applied Laryngoscope Size: Mac and 3 Grade View: Grade I Tube type: Oral Tube size: 7.0 mm Number of attempts: 1 Placement Confirmation: ETT inserted through vocal cords under direct vision,  positive ETCO2 and breath sounds checked- equal and bilateral Secured at: 23 cm Tube secured with: Tape Dental Injury: Teeth and Oropharynx as per pre-operative assessment

## 2012-05-22 NOTE — Progress Notes (Signed)
Called MD Rosenbower concerning critical lab of magnesium 0.7,

## 2012-05-22 NOTE — Progress Notes (Signed)
Report given to ICU nurse Darl Pikes, patient medicated for pain and nausea, patient received NS bolus as ordered, and is now down to radiology for CT. Family members notified Stanford Breed RN 05-22-2012 10:44am

## 2012-05-22 NOTE — Anesthesia Postprocedure Evaluation (Signed)
  Anesthesia Post-op Note  Patient: [name]  Procedure(s) Performed: Procedure(s) (LRB): EXPLORATORY LAPAROTOMY (N/A)  Patient Location: PACU  Anesthesia Type: General  Level of Consciousness: awake and alert   Airway and Oxygen Therapy: Patient Spontanous Breathing  Post-op Pain: mild  Post-op Assessment: Post-op Vital signs reviewed, Patient's Cardiovascular Status Stable, Respiratory Function Stable, Patent Airway and No signs of Nausea or vomiting  Last Vitals:  Filed Vitals:   05/22/12 1815  BP:   Pulse: 87  Temp:   Resp: 14    Post-op Vital Signs: stable   Complications: No apparent anesthesia complications

## 2012-05-22 NOTE — Addendum Note (Signed)
Addendum  created 05/22/12 1847 by Aishwarya Shiplett Catherine Payne Jekhi Bolin, CRNA   Modules edited:Anesthesia Flowsheet    

## 2012-05-22 NOTE — Addendum Note (Signed)
Addendum  created 05/22/12 1847 by Lattie Haw, CRNA   Modules edited:Anesthesia Flowsheet

## 2012-05-23 ENCOUNTER — Ambulatory Visit: Payer: BC Managed Care – PPO | Admitting: Cardiology

## 2012-05-23 ENCOUNTER — Encounter (HOSPITAL_COMMUNITY): Payer: Self-pay | Admitting: General Surgery

## 2012-05-23 DIAGNOSIS — I4891 Unspecified atrial fibrillation: Secondary | ICD-10-CM

## 2012-05-23 LAB — CBC
HCT: 28.3 % — ABNORMAL LOW (ref 36.0–46.0)
MCHC: 34.3 g/dL (ref 30.0–36.0)
Platelets: 212 10*3/uL (ref 150–400)
RDW: 14.4 % (ref 11.5–15.5)

## 2012-05-23 LAB — MAGNESIUM: Magnesium: 1.1 mg/dL — ABNORMAL LOW (ref 1.5–2.5)

## 2012-05-23 LAB — BASIC METABOLIC PANEL
CO2: 20 mEq/L (ref 19–32)
Calcium: 7 mg/dL — ABNORMAL LOW (ref 8.4–10.5)
Creatinine, Ser: 0.74 mg/dL (ref 0.50–1.10)
GFR calc Af Amer: >90 mL/min (ref >90)
GFR calc non Af Amer: 89 mL/min — ABNORMAL LOW (ref >90)
Sodium: 127 mEq/L — ABNORMAL LOW (ref 135–145)

## 2012-05-23 MED ORDER — SODIUM CHLORIDE 0.9 % IV BOLUS (SEPSIS)
500.0000 mL | Freq: Once | INTRAVENOUS | Status: AC
  Administered 2012-05-23: 500 mL via INTRAVENOUS

## 2012-05-23 MED ORDER — MAGNESIUM SULFATE 40 MG/ML IJ SOLN
4.0000 g | Freq: Once | INTRAMUSCULAR | Status: AC
  Administered 2012-05-23: 4 g via INTRAVENOUS
  Filled 2012-05-23: qty 100

## 2012-05-23 NOTE — Progress Notes (Signed)
1 Day Post-Op  Subjective: Comfortable.  Awake and alert  Objective: Vital signs in last 24 hours: Temp:  [97.1 F (36.2 C)-98.8 F (37.1 C)] 98.8 F (37.1 C) (02/05 0000) Pulse Rate:  [31-145] 96  (02/05 0600) Resp:  [11-18] 15  (02/05 0600) BP: (93-166)/(58-101) 119/71 mmHg (02/05 0600) SpO2:  [67 %-100 %] 100 % (02/05 0600) Weight:  [189 lb 9.5 oz (86 kg)-198 lb 6.6 oz (90 kg)] 198 lb 6.6 oz (90 kg) (02/05 0700) Last BM Date: 05/21/12  Intake/Output from previous day: 02/04 0701 - 02/05 0700 In: 6921.2 [I.V.:6306.7; NG/GT:40; IV Piggyback:574.5] Out: 3510 [Urine:1220; Emesis/NG output:130; Drains:160; Blood:800] Intake/Output this shift:    PE: General- In NAD Abdomen-soft, no bowel sounds, loop ileostomy pink with no significant enteric output or gas, midline dressing dry, LLQ open wound is clean, serous drain output  Lab Results:   Basename 05/23/12 0430 05/22/12 0551  WBC 13.3* 11.7*  HGB 9.7* 11.0*  HCT 28.3* 31.5*  PLT 212 254   BMET  Basename 05/23/12 0430 05/22/12 0627  NA 127* 125*  K 3.6 3.4*  CL 97 96  CO2 20 18*  GLUCOSE 168* 126*  BUN 7 5*  CREATININE 0.74 0.60  CALCIUM 7.0* 7.7*   PT/INR No results found for this basename: LABPROT:2,INR:2 in the last 72 hours Comprehensive Metabolic Panel:    Component Value Date/Time   NA 127* 05/23/2012 0430   NA 130* 02/09/2012 1321   K 3.6 05/23/2012 0430   K 4.6 02/09/2012 1321   CL 97 05/23/2012 0430   CL 101 02/09/2012 1321   CO2 20 05/23/2012 0430   CO2 18* 02/09/2012 1321   BUN 7 05/23/2012 0430   BUN 21.0 02/09/2012 1321   CREATININE 0.74 05/23/2012 0430   CREATININE 0.8 02/09/2012 1321   GLUCOSE 168* 05/23/2012 0430   GLUCOSE 79 02/09/2012 1321   CALCIUM 7.0* 05/23/2012 0430   CALCIUM 9.2 02/09/2012 1321   AST 35 05/10/2012 1140   AST 53* 02/09/2012 1321   ALT 37* 05/10/2012 1140   ALT 56* 02/09/2012 1321   ALKPHOS 496* 05/10/2012 1140   ALKPHOS 712* 02/09/2012 1321   BILITOT 0.3 05/10/2012 1140    BILITOT 0.50 02/09/2012 1321   PROT 8.0 05/10/2012 1140   PROT 7.0 02/09/2012 1321   ALBUMIN 3.6 05/10/2012 1140   ALBUMIN 3.8 02/09/2012 1321     Studies/Results: Ct Angio Chest Pe W/cm &/or Wo Cm  05/22/2012  *RADIOLOGY REPORT*  Clinical Data:  Shortness of breath, cough, evaluate for PE. Abdominal pain, history of right colectomy, evaluate for abscess or anastomotic leak.  CT ANGIOGRAPHY CHEST CT ABDOMEN AND PELVIS WITH CONTRAST  Technique:  Multidetector CT imaging of the chest was performed using the standard protocol during bolus administration of intravenous contrast.  Multiplanar CT image reconstructions including MIPs were obtained to evaluate the vascular anatomy. Multidetector CT imaging of the abdomen and pelvis was performed using the standard protocol during bolus administration of intravenous contrast.  Contrast: OMNIPAQUE IOHEXOL 350 MG/ML SOLN  Comparison:  CT abdomen pelvis dated 02/17/2012  CTA CHEST  Findings:  No evidence of pulmonary embolism.  Patchy bilateral lower lobe opacities, favored to reflect atelectasis.  Small right pleural effusion.  No pneumothorax.  Visualized thyroid is unremarkable.  The heart is top normal in size.  No pericardial effusion. Coronary atherosclerosis.  Atherosclerotic calcifications of the aortic arch.  Right arm PICC.  Distal esophagus is distended/fluid-filled, possibly reflecting gastroesophageal reflux or esophageal dysmotility.  Review of the MIP images confirms the above findings.  IMPRESSION: No evidence of pulmonary embolism.  Patchy bilateral lower lobe opacities, favored to reflect atelectasis.  Small right pleural effusion.  CT ABDOMEN AND PELVIS  Findings: Liver, spleen, pancreas, and adrenal glands are unremarkable.  Gallbladder is notable for pericholecystic fluid/ascites but is otherwise unremarkable.  No intrahepatic or extrahepatic ductal dilatation.  Kidneys are within normal limits.  No hydronephrosis.  Prior right hemicolectomy  with interval ileosigmoid side-to-side anastomosis.  Reversal of the prior right lower quadrant colostomy and left lower quadrant mucous fistula.  Dilated loops of proximal small bowel with focal transition in the left mid abdomen (series 8/image 45).  Mild swirling of decompressed small bowel in the mid abdomen (series 8/image 56). The distal small bowel is decompressed.  Few scattered foci of nondependent gas, possibly postsurgical. However, there are a few additional foci of gas in the central abdomen near the anastomosis (for example, series 8/image 63), which raise the possibility of an anastomotic leak.  6.0 x 11.3 cm partially loculated fluid collection in the lower abdomen with surrounding thin enhancing rim (series 8/image 72), worrisome for developing abscess.  Additional 2.2 x 1.3 cm developing abscess in the right lower pelvis (series 8/image 72). Additional fluid/ascites in the upper abdomen.  Atherosclerotic calcifications of the abdominal aorta and branch vessels.  No suspicious abdominopelvic lymphadenopathy.  Status post hysterectomy.  No adnexal masses.  Bladder is within normal limits.  1.6 cm probable left of breath lungs gland cyst (series 8/image 87).  Degenerative changes of the visualized thoracolumbar spine.  Grade II anterolisthesis of L4 on L5.  Left total hip arthroplasty.  Review of the MIP images confirms the above findings.  IMPRESSION: Prior right hemicolectomy is interval ileosigmoid side-to-side anastomosis.  Dilated loops of proximal small bowel with focal transition in the left mid abdomen, worrisome for small bowel obstruction, possibly on the basis of adhesions or internal hernia.  6.0 x 11.3 cm partially loculated fluid collection in the lower abdomen, worrisome for developing abscess.  Additional 2.2 x 1.3 cm developing abscess in the right lower pelvis.  Scattered foci of gas in the central abdomen near the anastomosis, raising the possibility of anastomotic leak.  These  results were called by telephone on 05/22/2012 at 1135 hours to Dr Avel Peace, who verbally acknowledged these results.   Original Report Authenticated By: Charline Bills, M.D.    Ct Abdomen Pelvis W Contrast  05/22/2012  *RADIOLOGY REPORT*  Clinical Data:  Shortness of breath, cough, evaluate for PE. Abdominal pain, history of right colectomy, evaluate for abscess or anastomotic leak.  CT ANGIOGRAPHY CHEST CT ABDOMEN AND PELVIS WITH CONTRAST  Technique:  Multidetector CT imaging of the chest was performed using the standard protocol during bolus administration of intravenous contrast.  Multiplanar CT image reconstructions including MIPs were obtained to evaluate the vascular anatomy. Multidetector CT imaging of the abdomen and pelvis was performed using the standard protocol during bolus administration of intravenous contrast.  Contrast: OMNIPAQUE IOHEXOL 350 MG/ML SOLN  Comparison:  CT abdomen pelvis dated 02/17/2012  CTA CHEST  Findings:  No evidence of pulmonary embolism.  Patchy bilateral lower lobe opacities, favored to reflect atelectasis.  Small right pleural effusion.  No pneumothorax.  Visualized thyroid is unremarkable.  The heart is top normal in size.  No pericardial effusion. Coronary atherosclerosis.  Atherosclerotic calcifications of the aortic arch.  Right arm PICC.  Distal esophagus is distended/fluid-filled, possibly reflecting gastroesophageal reflux or esophageal dysmotility.  Review of the MIP images confirms the above findings.  IMPRESSION: No evidence of pulmonary embolism.  Patchy bilateral lower lobe opacities, favored to reflect atelectasis.  Small right pleural effusion.  CT ABDOMEN AND PELVIS  Findings: Liver, spleen, pancreas, and adrenal glands are unremarkable.  Gallbladder is notable for pericholecystic fluid/ascites but is otherwise unremarkable.  No intrahepatic or extrahepatic ductal dilatation.  Kidneys are within normal limits.  No hydronephrosis.  Prior right  hemicolectomy with interval ileosigmoid side-to-side anastomosis.  Reversal of the prior right lower quadrant colostomy and left lower quadrant mucous fistula.  Dilated loops of proximal small bowel with focal transition in the left mid abdomen (series 8/image 45).  Mild swirling of decompressed small bowel in the mid abdomen (series 8/image 56). The distal small bowel is decompressed.  Few scattered foci of nondependent gas, possibly postsurgical. However, there are a few additional foci of gas in the central abdomen near the anastomosis (for example, series 8/image 63), which raise the possibility of an anastomotic leak.  6.0 x 11.3 cm partially loculated fluid collection in the lower abdomen with surrounding thin enhancing rim (series 8/image 72), worrisome for developing abscess.  Additional 2.2 x 1.3 cm developing abscess in the right lower pelvis (series 8/image 72). Additional fluid/ascites in the upper abdomen.  Atherosclerotic calcifications of the abdominal aorta and branch vessels.  No suspicious abdominopelvic lymphadenopathy.  Status post hysterectomy.  No adnexal masses.  Bladder is within normal limits.  1.6 cm probable left of breath lungs gland cyst (series 8/image 87).  Degenerative changes of the visualized thoracolumbar spine.  Grade II anterolisthesis of L4 on L5.  Left total hip arthroplasty.  Review of the MIP images confirms the above findings.  IMPRESSION: Prior right hemicolectomy is interval ileosigmoid side-to-side anastomosis.  Dilated loops of proximal small bowel with focal transition in the left mid abdomen, worrisome for small bowel obstruction, possibly on the basis of adhesions or internal hernia.  6.0 x 11.3 cm partially loculated fluid collection in the lower abdomen, worrisome for developing abscess.  Additional 2.2 x 1.3 cm developing abscess in the right lower pelvis.  Scattered foci of gas in the central abdomen near the anastomosis, raising the possibility of anastomotic  leak.  These results were called by telephone on 05/22/2012 at 1135 hours to Dr Avel Peace, who verbally acknowledged these results.   Original Report Authenticated By: Charline Bills, M.D.     Anti-infectives: Anti-infectives     Start     Dose/Rate Route Frequency Ordered Stop   05/22/12 0800   ertapenem (INVANZ) 1 g in sodium chloride 0.9 % 50 mL IVPB        1 g 100 mL/hr over 30 Minutes Intravenous Every 24 hours 05/22/12 0733     05/17/12 2000   cefoTEtan (CEFOTAN) 2 g in dextrose 5 % 50 mL IVPB        2 g 100 mL/hr over 30 Minutes Intravenous Every 12 hours 05/17/12 1312 05/17/12 2114   05/17/12 0606   cefOXitin (MEFOXIN) 2 g in dextrose 5 % 50 mL IVPB        2 g 100 mL/hr over 30 Minutes Intravenous On call to O.R. 05/17/12 0606 05/17/12 0751          Assessment Active Problems: 1.  Chronic ischemic colitis s/p reversal of colostomy, partial colectomy, takedown of mucous fistula 05/17/12  2.  Postop SBO, microleak from anastomosis and abscess s/p expl lap, loa, abscess drainage, oversewing of leak, loop ileostomy  05/22/12-on IV [employer]-stable overnight; mild hypovolemia;  findings at surgery and interventions discussed with her 3.  Afib-in NSR now 4.  Chronic anemia with ABL- hemoglobin 9.7 5.  Chronic hyponatremia 6.  CMML (chronic myelomonocytic leukemia 7.  Hypomagnesemia     LOS: 6 days   Plan:   IVF bolus.  Continue abxs.  Dangle.  Replete magnesium.  Toretto Tingler J 05/23/2012

## 2012-05-23 NOTE — Progress Notes (Signed)
   Subjective:  Patient feels well this am, complains only of being thirsty and dry mouth. Rhythm this am is back to NSR 96/min after having rapid heart rate yesterday afternoon in PACU.  Good response to IV cardizem. She is known to have normal EF 60-65% by previous outside echo 12/19/11.  Objective:  Vital Signs in the last 24 hours: Temp:  [97.1 F (36.2 C)-98.8 F (37.1 C)] 98.8 F (37.1 C) (02/05 0000) Pulse Rate:  [31-145] 96  (02/05 0600) Resp:  [11-18] 15  (02/05 0600) BP: (93-166)/(58-101) 119/71 mmHg (02/05 0600) SpO2:  [67 %-100 %] 100 % (02/05 0600) Weight:  [189 lb 9.5 oz (86 kg)] 189 lb 9.5 oz (86 kg) (02/04 1125)  Intake/Output from previous day: 02/04 0701 - 02/05 0700 In: 6921.2 [I.V.:6306.7; NG/GT:40; IV Piggyback:574.5] Out: 3510 [Urine:1220; Emesis/NG output:130; Drains:160; Blood:800] Intake/Output from this shift:       . enoxaparin (LOVENOX) injection  40 mg Subcutaneous Q24H  . ertapenem  1 g Intravenous Q24H  . multivitamins with iron  1 tablet Oral Daily      . dextrose 5% lactated ringers 125 mL/hr at 05/23/12 0547  . diltiazem (CARDIZEM) infusion 5 mg/hr (05/23/12 0600)  . liver oil-zinc oxide Stopped (05/22/12 1515)    Physical Exam: The patient appears to be in no distress.  Head and neck exam reveals that the pupils are equal and reactive.  The extraocular movements are full.  There is no scleral icterus.  Mouth and pharynx are benign.  No lymphadenopathy.  No carotid bruits.  The jugular venous pressure is normal.  Thyroid is not enlarged or tender.  Chest is clear to percussion and auscultation.  No rales or rhonchi.  Expansion of the chest is symmetrical.  Heart reveals no abnormal lift or heave.  First and second heart sounds are normal.  There is no murmur gallop rub or click.  The abdomen reveals absent bowel sounds.  Extremities reveal no phlebitis or edema.  Pedal pulses are good.  There is no cyanosis or clubbing.  Neurologic  exam is normal strength and no lateralizing weakness.  No sensory deficits. Alert, oriented.  Integument reveals no rash  Lab Results:  Basename 05/23/12 0430 05/22/12 0551  WBC 13.3* 11.7*  HGB 9.7* 11.0*  PLT 212 254    Basename 05/23/12 0430 05/22/12 0627  NA 127* 125*  K 3.6 3.4*  CL 97 96  CO2 20 18*  GLUCOSE 168* 126*  BUN 7 5*  CREATININE 0.74 0.60   No results found for this basename: TROPONINI:2,CK,MB:2 in the last 72 hours Hepatic Function Panel No results found for this basename: PROT,ALBUMIN,AST,ALT,ALKPHOS,BILITOT,BILIDIR,IBILI in the last 72 hours No results found for this basename: CHOL in the last 72 hours No results found for this basename: PROTIME in the last 72 hours  Imaging: Imaging results have been reviewed  Cardiac Studies:  Assessment/Plan:   Apparent post-op atrial fib with rapid ventricular response in PACU yesterday per phone conversation with Dr. Abbey Chatters yesterday afternoon (no strips or EKG available for review at present). Now back in NSR on IV diltiazem 5 mg/hr. No cardiac symptoms at present.  She has a past history of post-op atrial flutter/fib on prior admissions.  Rec: Since still NPO I would continue low dose IV diltiazem today.  LOS: 6 days    Cassell Clement 05/23/2012, 7:45 AM

## 2012-05-24 LAB — MAGNESIUM: Magnesium: 1.7 mg/dL (ref 1.5–2.5)

## 2012-05-24 LAB — CBC
Hemoglobin: 8.9 g/dL — ABNORMAL LOW (ref 12.0–15.0)
MCHC: 35.2 g/dL (ref 30.0–36.0)
RDW: 14.3 % (ref 11.5–15.5)
WBC: 7.3 10*3/uL (ref 4.0–10.5)

## 2012-05-24 LAB — BASIC METABOLIC PANEL
BUN: 7 mg/dL (ref 6–23)
GFR calc Af Amer: >90 mL/min (ref >90)
GFR calc non Af Amer: >90 mL/min (ref >90)
Potassium: 3.1 mEq/L — ABNORMAL LOW (ref 3.5–5.1)

## 2012-05-24 MED ORDER — ONDANSETRON HCL 4 MG/2ML IJ SOLN
INTRAMUSCULAR | Status: AC
  Filled 2012-05-24: qty 2

## 2012-05-24 MED ORDER — FUROSEMIDE 10 MG/ML IJ SOLN: 40.0000 mg | Freq: Two times a day (BID) | INTRAMUSCULAR | Status: DC

## 2012-05-24 MED ORDER — MUPIROCIN 2 % EX OINT
1.0000 "application " | TOPICAL_OINTMENT | Freq: Two times a day (BID) | CUTANEOUS | Status: AC
  Administered 2012-05-24 – 2012-05-28 (×10): 1 via NASAL
  Filled 2012-05-24 (×2): qty 22

## 2012-05-24 MED ORDER — CLINIMIX E/DEXTROSE (5/20) 5 % IV SOLN
INTRAVENOUS | Status: AC
  Administered 2012-05-24: 17:00:00 via INTRAVENOUS
  Filled 2012-05-24: qty 1000

## 2012-05-24 MED ORDER — CHLORHEXIDINE GLUCONATE CLOTH 2 % EX PADS
6.0000 | MEDICATED_PAD | Freq: Every day | CUTANEOUS | Status: DC
  Administered 2012-05-24 – 2012-05-27 (×4): 6 via TOPICAL

## 2012-05-24 MED ORDER — DILTIAZEM HCL 30 MG PO TABS
30.0000 mg | ORAL_TABLET | Freq: Four times a day (QID) | ORAL | Status: DC
  Filled 2012-05-24 (×3): qty 1

## 2012-05-24 MED ORDER — INSULIN ASPART 100 UNIT/ML ~~LOC~~ SOLN
0.0000 [IU] | Freq: Three times a day (TID) | SUBCUTANEOUS | Status: DC
  Administered 2012-05-25 – 2012-05-31 (×13): 1 [IU] via SUBCUTANEOUS

## 2012-05-24 MED ORDER — DEXTROSE IN LACTATED RINGERS 5 % IV SOLN
INTRAVENOUS | Status: DC
  Administered 2012-05-24: 1000 mL via INTRAVENOUS
  Administered 2012-05-29: 10 mL/h via INTRAVENOUS
  Administered 2012-06-03 – 2012-06-06 (×3): via INTRAVENOUS

## 2012-05-24 MED ORDER — FUROSEMIDE 10 MG/ML IJ SOLN
40.0000 mg | Freq: Once | INTRAMUSCULAR | Status: AC
  Administered 2012-05-24: 40 mg via INTRAVENOUS
  Filled 2012-05-24: qty 4

## 2012-05-24 MED ORDER — DEXTROSE 5 % IV SOLN
5.0000 mg/h | INTRAVENOUS | Status: DC
  Administered 2012-05-24 – 2012-05-25 (×2): 10 mg/h via INTRAVENOUS
  Administered 2012-05-25: 15 mg/h via INTRAVENOUS
  Administered 2012-05-25: 10 mg/h via INTRAVENOUS
  Administered 2012-05-26 – 2012-05-28 (×6): 15 mg/h via INTRAVENOUS
  Filled 2012-05-24: qty 100

## 2012-05-24 MED ORDER — POTASSIUM CHLORIDE 10 MEQ/50ML IV SOLN
10.0000 meq | INTRAVENOUS | Status: AC
  Administered 2012-05-24 (×5): 10 meq via INTRAVENOUS
  Filled 2012-05-24 (×2): qty 50
  Filled 2012-05-24: qty 100
  Filled 2012-05-24: qty 50

## 2012-05-24 NOTE — Progress Notes (Signed)
   Subjective:  More short of breath this am.  Remaining in NSR.Marland Kitchen She is known to have normal EF 60-65% by previous outside echo 12/19/11.  Objective:  Vital Signs in the last 24 hours: Temp:  [97.9 F (36.6 C)-98.2 F (36.8 C)] 98.1 F (36.7 C) (02/06 0400) Pulse Rate:  [91-103] 93  (02/06 0600) Resp:  [13-20] 14  (02/06 0600) BP: (111-145)/(60-78) 145/68 mmHg (02/06 0600) SpO2:  [96 %-100 %] 100 % (02/06 0600) Weight:  [199 lb 11.8 oz (90.6 kg)] 199 lb 11.8 oz (90.6 kg) (02/06 0600)  Intake/Output from previous day: 02/05 0701 - 02/06 0700 In: 3540 [I.V.:2990; IV Piggyback:550] Out: 1525 [Urine:895; Emesis/NG output:485; Drains:50; Stool:95] Intake/Output from this shift:       . enoxaparin (LOVENOX) injection  40 mg Subcutaneous Q24H  . ertapenem  1 g Intravenous Q24H  . furosemide  40 mg Intravenous Once  . multivitamins with iron  1 tablet Oral Daily  . potassium chloride  10 mEq Intravenous Q1 Hr x 5      . dextrose 5% lactated ringers 1,000 mL (05/24/12 0216)  . liver oil-zinc oxide Stopped (05/22/12 1515)    Physical Exam: The patient appears to be in no distress.  Head and neck exam reveals that the pupils are equal and reactive.  The extraocular movements are full.  There is no scleral icterus.  Mouth and pharynx are benign.  No lymphadenopathy.  No carotid bruits.  The jugular venous pressure is normal.  Thyroid is not enlarged or tender.  Chest reveals mild expiratory wheezing.  Heart reveals no abnormal lift or heave.  First and second heart sounds are normal.  There is no murmur gallop rub or click.  The abdomen reveals absent bowel sounds.  Extremities reveal no phlebitis or edema.  Pedal pulses are good.  There is no cyanosis or clubbing.  Neurologic exam is normal strength and no lateralizing weakness.  No sensory deficits. Alert, oriented.  Integument reveals no rash  Lab Results:  Basename 05/24/12 0415 05/23/12 0430  WBC 7.3 13.3*  HGB 8.9*  9.7*  PLT 219 212    Basename 05/24/12 0415 05/23/12 0430  NA 130* 127*  K 3.1* 3.6  CL 98 97  CO2 23 20  GLUCOSE 144* 168*  BUN 7 7  CREATININE 0.63 0.74   No results found for this basename: TROPONINI:2,CK,MB:2 in the last 72 hours Hepatic Function Panel No results found for this basename: PROT,ALBUMIN,AST,ALT,ALKPHOS,BILITOT,BILIDIR,IBILI in the last 72 hours No results found for this basename: CHOL in the last 72 hours No results found for this basename: PROTIME in the last 72 hours  Imaging: Imaging results have been reviewed  Cardiac Studies:  Assessment/Plan:   No further atrial fib. Wheezing and mild fluid overload Hypokalemia.  Plan: Stop IV cardizem         Decrease IV fluid rate.          Lasix 40 mg x 1 now          Replete K  LOS: 7 days    Cassell Clement 05/24/2012, 7:20 AM

## 2012-05-24 NOTE — Progress Notes (Signed)
INITIAL NUTRITION ASSESSMENT  DOCUMENTATION CODES Per approved criteria  -Not Applicable   INTERVENTION: TPN per PharmD PO intake per MD discretion  NUTRITION DIAGNOSIS: Inadequate oral intake related to current medical state s/p surgery and prolonged ileus as evidenced by NPO status.   Goal: Pt to meet >/= 90% of their estimated nutrition needs  Monitor:  Initiation of TPN Wt Readiness for PO diet  Reason for Assessment: Consult for TPN/TNA  62 y.o. female  Admitting Dx: Here for elective colostomy reversal  ASSESSMENT: Pt has chronic ischemic colitis status post reversal of colostomy, partial colectomy, takedown of mucous fistula 05/17/12. Postop SBO, microleak from anastomosis and abscess, abscess drainage, oversewing of leak, loop ileostomy 05/22/12; has an ileus; some volume overload-diuresis started. Pt showed some ileostomy output. Pt reports she has no appetite. Pt has been ordered TPN, managed by PharmD;  to be initiated today.  Patient is receiving TPN with Clinimix E 5/20 @ goal rate of 70 ml/hr.  Lipids (20% IVFE @ 10 ml/hr) on MWF, multivitamins, and trace elements are provided 3 times weekly (MWF) due to national backorder.  Provides 1684 kcal and 84 grams protein daily (based on weekly average).  Meets 101% minimum estimated kcal and 110%% minimum estimated protein needs.  Additional IVF with D5LR @ 50 ml/hr.   Height: Ht Readings from Last 1 Encounters:  05/22/12 5\' 5"  (1.651 m)    Weight: Wt Readings from Last 1 Encounters:  05/24/12 199 lb 11.8 oz (90.6 kg)    Ideal Body Weight: 125 lb  % Ideal Body Weight: 133%  Wt Readings from Last 10 Encounters:  05/24/12 199 lb 11.8 oz (90.6 kg)  05/24/12 199 lb 11.8 oz (90.6 kg)  05/24/12 199 lb 11.8 oz (90.6 kg)  05/10/12 166 lb 12.8 oz (75.66 kg)  03/20/12 166 lb (75.297 kg)  02/21/12 163 lb (73.936 kg)  02/09/12 166 lb 11.2 oz (75.615 kg)  02/07/12 163 lb 12.8 oz (74.299 kg)  01/30/12 165 lb (74.844  kg)  01/11/12 183 lb (83.008 kg)    Usual Body Weight: 166 lb  % Usual Body Weight: 120% (note fluid status)  BMI:  Body mass index is 33.24 kg/(m^2).  Estimated Nutritional Needs: Kcal: 1660-1930 Protein: 76-91grams Fluid: >1.7L/day or per MD discretion  Skin: Generalized edema, Incisions of abdomen and Left hip; clean and dry  Diet Order: NPO  EDUCATION NEEDS: -No education needs identified at this time   Intake/Output Summary (Last 24 hours) at 05/24/12 1204 Last data filed at 05/24/12 1132  Gross per 24 hour  Intake   2658 ml  Output   3595 ml  Net   -937 ml    Last BM: 2/3 per RN notes  Labs:   Lab 05/24/12 0415 05/23/12 0430 05/22/12 0627  NA 130* 127* 125*  K 3.1* 3.6 3.4*  CL 98 97 96  CO2 23 20 18*  BUN 7 7 5*  CREATININE 0.63 0.74 0.60  CALCIUM 7.4* 7.0* 7.7*  MG 1.7 1.1* 0.7*  PHOS -- -- --  GLUCOSE 144* 168* 126*    CBG (last 3)  No results found for this basename: GLUCAP:3 in the last 72 hours  Scheduled Meds:   . Chlorhexidine Gluconate Cloth  6 each Topical Q0600  . enoxaparin (LOVENOX) injection  40 mg Subcutaneous Q24H  . ertapenem  1 g Intravenous Q24H  . insulin aspart  0-9 Units Subcutaneous Q8H  . multivitamins with iron  1 tablet Oral Daily  . mupirocin ointment  1 application Nasal BID  . potassium chloride  10 mEq Intravenous Q1 Hr x 5    Continuous Infusions:   . dextrose 5% lactated ringers 50 mL/hr (05/24/12 0800)  . dextrose 5% lactated ringers    . liver oil-zinc oxide Stopped (05/22/12 1515)  . TPN [facility]) +/- additives      Past Medical History  Diagnosis Date  . Hypertension     She has a past hx of essential  . 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 Inflammation of joint of knee     Since we last last saw her she developed problems with an acute which required surgical drainage by her orthopedist Dr. Cleophas Dunker.  Marland Kitchen MRSA  (methicillin resistant Staphylococcus aureus)     Knee surgery drainage was positive for MRSA and she was treated with 3 weeks of doxycycline successfully.  . Diarrhea     Mild  . Exogenous obesity   . GERD (gastroesophageal reflux disease)     2 hosp.- ischemic colitis - residual of Norovirus, 05/2011- sm. bowel obstruction  . Headache     migraine headache on occas, less now than when she was younger   . Arthritis     L hip, back, neck   . History of blood transfusion sept 2013    04/2011- /w ischemic colitis , trouble with matching blood  sept 2013  . Anemia     will see hematology consult prior to surgery, recommended by Dr. Patty Sermons  . Anemia 12/15/2010  . Ischemic colitis 01/31/2012  . Atrial flutter     during hospitalization, 04/2011- related to anemia & illness/stress   . Pneumonia     04/2011- not hospitalized , pt. denies SOB, changes in chest, breathing  . 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 Dizziness     occasional    Past Surgical History  Procedure Date  . Knee surgery     I&D- 2008, post laceration   . Colonoscopy 05/16/2011    Procedure: COLONOSCOPY;  Surgeon: Vertell Novak., MD;  Location: [facility];  Service: Endoscopy;  Laterality: N/A;  . Small intestine surgery 1992, 1999  . Laparotomy and lysis of adhesions   . Total hip arthroplasty 10/25/2011    Procedure: TOTAL HIP ARTHROPLASTY;  Surgeon: Valeria Batman, MD;  Location: [facility] OR;  Service: Orthopedics;  Laterality: Left;  . Appendectomy 1962  . Abdominal hysterectomy 1988  . Partial colectomy and colostomy sept 2013    mucous fistula done  . Partial colectomy 05/17/2012    Procedure: PARTIAL COLECTOMY;  Surgeon: Adolph Pollack, MD;  Location: [facility] ORS;  Service: General;  Laterality: N/A;  . Colostomy closure 05/17/2012    Procedure: COLOSTOMY CLOSURE;  Surgeon: Adolph Pollack, MD;  Location: [facility];  Service: General;  Laterality: N/A;  . Laparotomy 05/17/2012    Procedure:  EXPLORATORY LAPAROTOMY;  Surgeon: Adolph Pollack, MD;  Location: [facility];  Service: General;;  . Lysis of adhesion 05/17/2012    Procedure: LYSIS OF ADHESION;  Surgeon: Adolph Pollack, MD;  Location: [facility];  Service: General;;  . Esophageal biopsy 05/17/2012    Procedure: BIOPSY;  Surgeon: Adolph Pollack, MD;  Location: [facility];  Service: General;;  omental biopsy  . Laparotomy 05/22/2012    Procedure: EXPLORATORY LAPAROTOMY;  Surgeon: Adolph Pollack, MD;  Location: [facility];  Service: General;  Laterality: N/A;  DRAINAGE  INTRA-ABDOMINAL ABSCESS/LYSIS OF ADHESIONSFOR SMALL BOWEL OBSTRUCTION/DIVERTING LOOP ILEOSTOMY    Ian Malkin RD, LDN Inpatient Clinical Dietitian Pager: [phone] After Hours Pager: [phone]

## 2012-05-24 NOTE — Progress Notes (Signed)
PARENTERAL NUTRITION CONSULT NOTE - INITIAL  Pharmacy Consult for TNA Indication: Prolonged ileus  Allergies  Allergen Reactions  . Vancomycin Hives and Rash    ? wheezing  . Codeine Nausea And Vomiting  . Tetanus Toxoids Other (See Comments)    serum  . Penicillins Hives and Rash  . Xarelto (Rivaroxaban) Hives and Rash    ?    Patient Measurements: Height: 5\' 5"  (165.1 cm) Weight: 199 lb 11.8 oz (90.6 kg) IBW/kg (Calculated) : 57  Adjusted Body Weight: 70.4 kg  Vital Signs: Temp: 98.1 F (36.7 C) (02/06 0400) Temp src: Oral (02/06 0400) BP: 145/68 mmHg (02/06 0600) Pulse Rate: 93  (02/06 0600) Intake/Output from previous day: 02/05 0701 - 02/06 0700 In: 3540 [I.V.:2990; IV Piggyback:550] Out: 1525 [Urine:895; Emesis/NG output:485; Drains:50; Stool:95] Intake/Output from this shift:    Labs:  Basename 05/24/12 0415 05/23/12 0430 05/22/12 0551  WBC 7.3 13.3* 11.7*  HGB 8.9* 9.7* 11.0*  HCT 25.3* 28.3* 31.5*  PLT 219 212 254  APTT -- -- --  INR -- -- --     Basename 05/24/12 0415 05/23/12 0430 05/22/12 0627  NA 130* 127* 125*  K 3.1* 3.6 3.4*  CL 98 97 96  CO2 23 20 18*  GLUCOSE 144* 168* 126*  BUN 7 7 5*  CREATININE 0.63 0.74 0.60  LABCREA -- -- --  CREAT24HRUR -- -- --  CALCIUM 7.4* 7.0* 7.7*  MG 1.7 1.1* 0.7*  PHOS -- -- --  PROT -- -- --  ALBUMIN -- -- --  AST -- -- --  ALT -- -- --  ALKPHOS -- -- --  BILITOT -- -- --  BILIDIR -- -- --  IBILI -- -- --  PREALBUMIN -- -- --  TRIG -- -- --  CHOLHDL -- -- --  CHOL -- -- --   Estimated Creatinine Clearance: 80 ml/min (by C-G formula based on Cr of 0.63).   No results found for this basename: GLUCAP:3 in the last 72 hours  Medical History: Past Medical History  Diagnosis Date  . Hypertension     She has a past hx of essential  . 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 Inflammation of joint of knee     Since we  last last saw her she developed problems with an acute which required surgical drainage by her orthopedist Dr. Cleophas Dunker.  Marland Kitchen MRSA (methicillin resistant Staphylococcus aureus)     Knee surgery drainage was positive for MRSA and she was treated with 3 weeks of doxycycline successfully.  . Diarrhea     Mild  . Exogenous obesity   . GERD (gastroesophageal reflux disease)     2 hosp.- ischemic colitis - residual of Norovirus, 05/2011- sm. bowel obstruction  . Headache     migraine headache on occas, less now than when she was younger   . Arthritis     L hip, back, neck   . History of blood transfusion sept 2013    04/2011- /w ischemic colitis , trouble with matching blood  sept 2013  . Anemia     will see hematology consult prior to surgery, recommended by Dr. Patty Sermons  . Anemia 12/15/2010  . Ischemic colitis 01/31/2012  . Atrial flutter     during hospitalization, 04/2011- related to anemia & illness/stress   . Pneumonia     04/2011- not hospitalized , pt. denies SOB,  changes in chest, breathing  . 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 Dizziness     occasional    CBGs & Insulin requirements past 24 hours:  No CBG checks, no insulin on board - Glucose ok on BMET  Nutritional Goals:  RD recommendation: pending Estimated requirement: 70-85 g protein/day, 1600-1800 kcal/day Clinimix E 5/20@ goal rate of 70 ml/hr + IVF 20% @10  ml/hr on MWF will provide: 84 g protein/day, 1478 kcal on non-lipid days, 1958 kcal on lipid days (average of 1684 kcal/day of the week)  Current nutrition:  Diet: NPO TNA: to start today 2/6 mIVF: D5LR @ 50 ml/hr   Assessment:  23 yof with chronic ischemic colitis s/p emergency L colectomy 12/2011 presented 05/17/2012 for partial colectomy, takedown of mucous fistula and colostomy reversal.  On 2/4, patient found with SBO, intra-abd abscess, and small anastomotic leak. Patient returned to the OR on 2/4 for exploratory laparotomy, lysis of adhesions  for SBO, drainage of intra-abd abscess, oversewing of anastomotic leak and loop ileostomy. OR course complicated by post-op afib (now NSR). On 2/6, MD order for pharmacy to start TNA.  Patient already with PICC line, no history of DM noted. Patient's wt increased from 75 kg documented on 1/30 to 90 kg documented today. Patient is receiving PO multivitamins with iron.   2/6: D#1 of TNA, will monitor for re-feeding syndrome since patient has been NPO for a prolonged period of time.   Labs: Electrolytes:  Low Na+ prior to TNA initiation, K+ 3.1 and MD already ordered for 5 runs of KCl, corrected Cal slightly low (7.7) today using albumin last collected 1/23 (think alb is much lower now so will f/u with corrected Cal tomorrow prior to repleting today). Mag wnl after repletion 2/5. Renal Function: Scr and BUN wnl/stable Hepatic Function: Alk phos elevated at 496 on 1/23 - f/u with labs in AM Pre-Albumin: f/u in AM Triglycerides: f/u in AM  CBGs: fairly controlled now without insulin  Plan:  At 1800 tonight  Start Clinimix E 5/20 at 40 ml/hr  TNA to contain IV fat emulsion 20% at 10 ml/hr on MWF only due to ongoing shortage  Standard multivitamins and trace elements on MWF only due to ongoing shortage but currently patient is on oral multivitamin with iron  Reduce IVF to [facility]  Add sensitive SSI q8h and CBG checks q8h  MD already replaced K+  TNA labs Monday/Thursdays  Dietician consult  Pharmacy will follow up daily, monitor closely for re-feeding syndrome.  Geoffry Paradise, PharmD, BCPS Pager: [phone] 8:37 AM Pharmacy #: [phone]

## 2012-05-24 NOTE — Progress Notes (Addendum)
Called by RN because of 2 short runs SVT. Pt asymptomatic and BP did not drop. HR currently > 100 and is SR. IV Cardizem is off.   Can restart IV Cardizem if HR continues to climb or she goes back into fib.  Theodore Demark, PA 05/24/2012 6:27 PM

## 2012-05-24 NOTE — Progress Notes (Signed)
2 Days Post-Op  Subjective: More sore today.  Feels some rectal pressure.  Would like to have a diet Coke.  Objective: Vital signs in last 24 hours: Temp:  [97.9 F (36.6 C)-98.2 F (36.8 C)] 97.9 F (36.6 C) (02/06 0848) Pulse Rate:  [91-103] 93  (02/06 0600) Resp:  [13-18] 14  (02/06 0600) BP: (112-145)/(60-78) 145/68 mmHg (02/06 0600) SpO2:  [96 %-100 %] 100 % (02/06 0600) Weight:  [199 lb 11.8 oz (90.6 kg)] 199 lb 11.8 oz (90.6 kg) (02/06 0600) Last BM Date: 05/21/12  Intake/Output from previous day: 02/05 0701 - 02/06 0700 In: 3540 [I.V.:2990; IV Piggyback:550] Out: 1525 [Urine:895; Emesis/NG output:485; Drains:50; Stool:95] Intake/Output this shift: Total I/O In: 154 [I.V.:50; IV Piggyback:104] Out: 150 [Urine:150]  PE: General- In NAD Abdomen-soft, no bowel sounds, loop ileostomy pink with thin green liquid output this AM, midline wound open, clean and redressed, LLQ open wound is clean, serous drain output  Lab Results:   Basename 05/24/12 0415 05/23/12 0430  WBC 7.3 13.3*  HGB 8.9* 9.7*  HCT 25.3* 28.3*  PLT 219 212   BMET  Basename 05/24/12 0415 05/23/12 0430  NA 130* 127*  K 3.1* 3.6  CL 98 97  CO2 23 20  GLUCOSE 144* 168*  BUN 7 7  CREATININE 0.63 0.74  CALCIUM 7.4* 7.0*   PT/INR No results found for this basename: LABPROT:2,INR:2 in the last 72 hours Comprehensive Metabolic Panel:    Component Value Date/Time   NA 130* 05/24/2012 0415   NA 130* 02/09/2012 1321   K 3.1* 05/24/2012 0415   K 4.6 02/09/2012 1321   CL 98 05/24/2012 0415   CL 101 02/09/2012 1321   CO2 23 05/24/2012 0415   CO2 18* 02/09/2012 1321   BUN 7 05/24/2012 0415   BUN 21.0 02/09/2012 1321   CREATININE 0.63 05/24/2012 0415   CREATININE 0.8 02/09/2012 1321   GLUCOSE 144* 05/24/2012 0415   GLUCOSE 79 02/09/2012 1321   CALCIUM 7.4* 05/24/2012 0415   CALCIUM 9.2 02/09/2012 1321   AST 35 05/10/2012 1140   AST 53* 02/09/2012 1321   ALT 37* 05/10/2012 1140   ALT 56* 02/09/2012 1321    ALKPHOS 496* 05/10/2012 1140   ALKPHOS 712* 02/09/2012 1321   BILITOT 0.3 05/10/2012 1140   BILITOT 0.50 02/09/2012 1321   PROT 8.0 05/10/2012 1140   PROT 7.0 02/09/2012 1321   ALBUMIN 3.6 05/10/2012 1140   ALBUMIN 3.8 02/09/2012 1321     Studies/Results: Ct Angio Chest Pe W/cm &/or Wo Cm  05/22/2012  *RADIOLOGY REPORT*  Clinical Data:  Shortness of breath, cough, evaluate for PE. Abdominal pain, history of right colectomy, evaluate for abscess or anastomotic leak.  CT ANGIOGRAPHY CHEST CT ABDOMEN AND PELVIS WITH CONTRAST  Technique:  Multidetector CT imaging of the chest was performed using the standard protocol during bolus administration of intravenous contrast.  Multiplanar CT image reconstructions including MIPs were obtained to evaluate the vascular anatomy. Multidetector CT imaging of the abdomen and pelvis was performed using the standard protocol during bolus administration of intravenous contrast.  Contrast: OMNIPAQUE IOHEXOL 350 MG/ML SOLN  Comparison:  CT abdomen pelvis dated 02/17/2012  CTA CHEST  Findings:  No evidence of pulmonary embolism.  Patchy bilateral lower lobe opacities, favored to reflect atelectasis.  Small right pleural effusion.  No pneumothorax.  Visualized thyroid is unremarkable.  The heart is top normal in size.  No pericardial effusion. Coronary atherosclerosis.  Atherosclerotic calcifications of the aortic arch.  Right arm PICC.  Distal esophagus is distended/fluid-filled, possibly reflecting gastroesophageal reflux or esophageal dysmotility.   Review of the MIP images confirms the above findings.  IMPRESSION: No evidence of pulmonary embolism.  Patchy bilateral lower lobe opacities, favored to reflect atelectasis.  Small right pleural effusion.  CT ABDOMEN AND PELVIS  Findings: Liver, spleen, pancreas, and adrenal glands are unremarkable.  Gallbladder is notable for pericholecystic fluid/ascites but is otherwise unremarkable.  No intrahepatic or extrahepatic ductal  dilatation.  Kidneys are within normal limits.  No hydronephrosis.  Prior right hemicolectomy with interval ileosigmoid side-to-side anastomosis.  Reversal of the prior right lower quadrant colostomy and left lower quadrant mucous fistula.  Dilated loops of proximal small bowel with focal transition in the left mid abdomen (series 8/image 45).  Mild swirling of decompressed small bowel in the mid abdomen (series 8/image 56). The distal small bowel is decompressed.  Few scattered foci of nondependent gas, possibly postsurgical. However, there are a few additional foci of gas in the central abdomen near the anastomosis (for example, series 8/image 63), which raise the possibility of an anastomotic leak.  6.0 x 11.3 cm partially loculated fluid collection in the lower abdomen with surrounding thin enhancing rim (series 8/image 72), worrisome for developing abscess.  Additional 2.2 x 1.3 cm developing abscess in the right lower pelvis (series 8/image 72). Additional fluid/ascites in the upper abdomen.  Atherosclerotic calcifications of the abdominal aorta and branch vessels.  No suspicious abdominopelvic lymphadenopathy.  Status post hysterectomy.  No adnexal masses.  Bladder is within normal limits.  1.6 cm probable left of breath lungs gland cyst (series 8/image 87).  Degenerative changes of the visualized thoracolumbar spine.  Grade II anterolisthesis of L4 on L5.  Left total hip arthroplasty.  Review of the MIP images confirms the above findings.  IMPRESSION: Prior right hemicolectomy is interval ileosigmoid side-to-side anastomosis.  Dilated loops of proximal small bowel with focal transition in the left mid abdomen, worrisome for small bowel obstruction, possibly on the basis of adhesions or internal hernia.  6.0 x 11.3 cm partially loculated fluid collection in the lower abdomen, worrisome for developing abscess.  Additional 2.2 x 1.3 cm developing abscess in the right lower pelvis.  Scattered foci of gas in the  central abdomen near the anastomosis, raising the possibility of anastomotic leak.  These results were called by telephone on 05/22/2012 at 1135 hours to Dr Avel Peace, who verbally acknowledged these results.   Original Report Authenticated By: Charline Bills, M.D.    Ct Abdomen Pelvis W Contrast  05/22/2012  *RADIOLOGY REPORT*  Clinical Data:  Shortness of breath, cough, evaluate for PE. Abdominal pain, history of right colectomy, evaluate for abscess or anastomotic leak.  CT ANGIOGRAPHY CHEST CT ABDOMEN AND PELVIS WITH CONTRAST  Technique:  Multidetector CT imaging of the chest was performed using the standard protocol during bolus administration of intravenous contrast.  Multiplanar CT image reconstructions including MIPs were obtained to evaluate the vascular anatomy. Multidetector CT imaging of the abdomen and pelvis was performed using the standard protocol during bolus administration of intravenous contrast.  Contrast: OMNIPAQUE IOHEXOL 350 MG/ML SOLN  Comparison:  CT abdomen pelvis dated 02/17/2012  CTA CHEST  Findings:  No evidence of pulmonary embolism.  Patchy bilateral lower lobe opacities, favored to reflect atelectasis.  Small right pleural effusion.  No pneumothorax.  Visualized thyroid is unremarkable.  The heart is top normal in size.  No pericardial effusion. Coronary atherosclerosis.  Atherosclerotic calcifications of the aortic  arch.  Right arm PICC.  Distal esophagus is distended/fluid-filled, possibly reflecting gastroesophageal reflux or esophageal dysmotility.   Review of the MIP images confirms the above findings.  IMPRESSION: No evidence of pulmonary embolism.  Patchy bilateral lower lobe opacities, favored to reflect atelectasis.  Small right pleural effusion.  CT ABDOMEN AND PELVIS  Findings: Liver, spleen, pancreas, and adrenal glands are unremarkable.  Gallbladder is notable for pericholecystic fluid/ascites but is otherwise unremarkable.  No intrahepatic or extrahepatic  ductal dilatation.  Kidneys are within normal limits.  No hydronephrosis.  Prior right hemicolectomy with interval ileosigmoid side-to-side anastomosis.  Reversal of the prior right lower quadrant colostomy and left lower quadrant mucous fistula.  Dilated loops of proximal small bowel with focal transition in the left mid abdomen (series 8/image 45).  Mild swirling of decompressed small bowel in the mid abdomen (series 8/image 56). The distal small bowel is decompressed.  Few scattered foci of nondependent gas, possibly postsurgical. However, there are a few additional foci of gas in the central abdomen near the anastomosis (for example, series 8/image 63), which raise the possibility of an anastomotic leak.  6.0 x 11.3 cm partially loculated fluid collection in the lower abdomen with surrounding thin enhancing rim (series 8/image 72), worrisome for developing abscess.  Additional 2.2 x 1.3 cm developing abscess in the right lower pelvis (series 8/image 72). Additional fluid/ascites in the upper abdomen.  Atherosclerotic calcifications of the abdominal aorta and branch vessels.  No suspicious abdominopelvic lymphadenopathy.  Status post hysterectomy.  No adnexal masses.  Bladder is within normal limits.  1.6 cm probable left of breath lungs gland cyst (series 8/image 87).  Degenerative changes of the visualized thoracolumbar spine.  Grade II anterolisthesis of L4 on L5.  Left total hip arthroplasty.  Review of the MIP images confirms the above findings.  IMPRESSION: Prior right hemicolectomy is interval ileosigmoid side-to-side anastomosis.  Dilated loops of proximal small bowel with focal transition in the left mid abdomen, worrisome for small bowel obstruction, possibly on the basis of adhesions or internal hernia.  6.0 x 11.3 cm partially loculated fluid collection in the lower abdomen, worrisome for developing abscess.  Additional 2.2 x 1.3 cm developing abscess in the right lower pelvis.  Scattered foci of gas  in the central abdomen near the anastomosis, raising the possibility of anastomotic leak.  These results were called by telephone on 05/22/2012 at 1135 hours to Dr Avel Peace, who verbally acknowledged these results.   Original Report Authenticated By: Charline Bills, M.D.     Anti-infectives: Anti-infectives     Start     Dose/Rate Route Frequency Ordered Stop   05/22/12 0800   ertapenem (INVANZ) 1 g in sodium chloride 0.9 % 50 mL IVPB        1 g 100 mL/hr over 30 Minutes Intravenous Every 24 hours 05/22/12 0733     05/17/12 2000   cefoTEtan (CEFOTAN) 2 g in dextrose 5 % 50 mL IVPB        2 g 100 mL/hr over 30 Minutes Intravenous Every 12 hours 05/17/12 1312 05/17/12 2114   05/17/12 0606   cefOXitin (MEFOXIN) 2 g in dextrose 5 % 50 mL IVPB        2 g 100 mL/hr over 30 Minutes Intravenous On call to O.R. 05/17/12 0606 05/17/12 0751          Assessment Active Problems: 1.  Chronic ischemic colitis s/p reversal of colostomy, partial colectomy, takedown of mucous fistula 05/17/12  2.  Postop SBO, microleak from anastomosis and abscess s/p expl lap, loa, abscess drainage, oversewing of leak, loop ileostomy 05/22/12-on IV InVanz; has an ileus as expected; some volume overload-diuresis started. 3.  Afib-in NSR now; off Cardizem 4.  Chronic anemia with ABL- hemoglobin 8.9, likely dilutional 5.  Chronic hyponatremia 6.  CMML (chronic myelomonocytic leukemia 7.  Hypomagnesemia-corrected 8.  PC malnutrition. 9.  Hypokalemia-supplement ordered.     LOS: 7 days   Plan:   Start TPN.  OOB to chair.  Laconya Clere J 05/24/2012

## 2012-05-25 LAB — COMPREHENSIVE METABOLIC PANEL
ALT: 8 U/L (ref 0–35)
BUN: 7 mg/dL (ref 6–23)
Calcium: 7.9 mg/dL — ABNORMAL LOW (ref 8.4–10.5)
Creatinine, Ser: 0.63 mg/dL (ref 0.50–1.10)
GFR calc Af Amer: >90 mL/min (ref >90)
GFR calc non Af Amer: >90 mL/min (ref >90)
Glucose, Bld: 117 mg/dL — ABNORMAL HIGH (ref 70–99)
Sodium: 135 mEq/L (ref 135–145)
Total Protein: 5.2 g/dL — ABNORMAL LOW (ref 6.0–8.3)

## 2012-05-25 LAB — CBC
MCH: 32.2 pg (ref 26.0–34.0)
MCHC: 33.8 g/dL (ref 30.0–36.0)
MCV: 95.2 fL (ref 78.0–100.0)
Platelets: 300 10*3/uL (ref 150–400)
RDW: 14.3 % (ref 11.5–15.5)

## 2012-05-25 LAB — CHOLESTEROL, TOTAL: Cholesterol: 122 mg/dL (ref 0–200)

## 2012-05-25 LAB — GLUCOSE, CAPILLARY
Glucose-Capillary: 131 mg/dL — ABNORMAL HIGH (ref 70–99)
Glucose-Capillary: 135 mg/dL — ABNORMAL HIGH (ref 70–99)

## 2012-05-25 LAB — DIFFERENTIAL
Eosinophils Absolute: 0.1 10*3/uL (ref 0.0–0.7)
Lymphs Abs: 1.4 10*3/uL (ref 0.7–4.0)
Monocytes Absolute: 1.9 10*3/uL — ABNORMAL HIGH (ref 0.1–1.0)
Neutrophils Relative %: 63 % (ref 43–77)

## 2012-05-25 LAB — MAGNESIUM: Magnesium: 1.1 mg/dL — ABNORMAL LOW (ref 1.5–2.5)

## 2012-05-25 LAB — PHOSPHORUS: Phosphorus: 2.3 mg/dL (ref 2.3–4.6)

## 2012-05-25 LAB — PREALBUMIN: Prealbumin: 7.8 mg/dL — ABNORMAL LOW (ref 17.0–34.0)

## 2012-05-25 MED ORDER — POTASSIUM CHLORIDE 10 MEQ/50ML IV SOLN
10.0000 meq | INTRAVENOUS | Status: AC
  Administered 2012-05-25 (×3): 10 meq via INTRAVENOUS
  Filled 2012-05-25: qty 150

## 2012-05-25 MED ORDER — MAGNESIUM SULFATE 40 MG/ML IJ SOLN
2.0000 g | Freq: Once | INTRAMUSCULAR | Status: AC
  Administered 2012-05-25: 2 g via INTRAVENOUS
  Filled 2012-05-25: qty 50

## 2012-05-25 MED ORDER — FUROSEMIDE 10 MG/ML IJ SOLN
40.0000 mg | Freq: Once | INTRAMUSCULAR | Status: AC
  Administered 2012-05-25: 40 mg via INTRAVENOUS
  Filled 2012-05-25: qty 4

## 2012-05-25 MED ORDER — FAT EMULSION 20 % IV EMUL
250.0000 mL | INTRAVENOUS | Status: AC
  Administered 2012-05-25: 250 mL via INTRAVENOUS
  Filled 2012-05-25: qty 250

## 2012-05-25 MED ORDER — TRACE MINERALS CR-CU-F-FE-I-MN-MO-SE-ZN IV SOLN
INTRAVENOUS | Status: AC
  Administered 2012-05-25: 17:00:00 via INTRAVENOUS
  Filled 2012-05-25: qty 2000

## 2012-05-25 MED ORDER — POTASSIUM CHLORIDE 10 MEQ/100ML IV SOLN
10.0000 meq | INTRAVENOUS | Status: AC
  Administered 2012-05-25 (×4): 10 meq via INTRAVENOUS
  Filled 2012-05-25 (×4): qty 100

## 2012-05-25 NOTE — Progress Notes (Addendum)
PARENTERAL NUTRITION CONSULT NOTE - FOLLOW UP  Pharmacy Consult for TNA Indication: Prolonged ileus  Allergies  Allergen Reactions  . Vancomycin Hives and Rash    ? wheezing  . Codeine Nausea And Vomiting  . Tetanus Toxoids Other (See Comments)    serum  . Penicillins Hives and Rash  . Xarelto (Rivaroxaban) Hives and Rash    ?    Patient Measurements: Height: 5\' 5"  (165.1 cm) Weight: 188 lb 15 oz (85.7 kg) IBW/kg (Calculated) : 57  Adjusted Body Weight: 65.6kg Usual Weight: 75.4kg  Vital Signs: Temp: 99.7 F (37.6 C) (02/07 0400) Temp src: Oral (02/07 0400) BP: 146/72 mmHg (02/07 0800) Pulse Rate: 112  (02/07 0800) Intake/Output from previous day: 02/06 0701 - 02/07 0700 In: 1665 [I.V.:915; IV Piggyback:220; TPN:520] Out: 6940 [Urine:6270; Emesis/NG output:500; Drains:20; Stool:150] Intake/Output from this shift:    Labs:  Basename 05/25/12 0500 05/24/12 0415 05/23/12 0430  WBC 9.1 7.3 13.3*  HGB 10.0* 8.9* 9.7*  HCT 29.6* 25.3* 28.3*  PLT 300 219 212  APTT -- -- --  INR -- -- --     Basename 05/25/12 0500 05/24/12 0415 05/23/12 0430  NA 135 130* 127*  K 3.0* 3.1* 3.6  CL 98 98 97  CO2 27 23 20   GLUCOSE 117* 144* 168*  BUN 7 7 7   CREATININE 0.63 0.63 0.74  LABCREA -- -- --  CREAT24HRUR -- -- --  CALCIUM 7.9* 7.4* 7.0*  MG 1.1* 1.7 1.1*  PHOS 2.3 -- --  PROT 5.2* -- --  ALBUMIN 1.9* -- --  AST 10 -- --  ALT 8 -- --  ALKPHOS 189* -- --  BILITOT 0.4 -- --  BILIDIR -- -- --  IBILI -- -- --  PREALBUMIN -- -- --  TRIG 123 -- --  CHOLHDL -- -- --  CHOL 122 -- --  Corrected calcium = 9.58 Estimated Creatinine Clearance: 77.8 ml/min (by C-G formula based on Cr of 0.63).    Basename 05/25/12 0029 05/24/12 1656  GLUCAP 131* 76    Medications:  Scheduled:    . Chlorhexidine Gluconate Cloth  6 each Topical Q0600  . enoxaparin (LOVENOX) injection  40 mg Subcutaneous Q24H  . ertapenem  1 g Intravenous Q24H  . [COMPLETED] furosemide  40 mg  Intravenous Once  . furosemide  40 mg Intravenous Once  . insulin aspart  0-9 Units Subcutaneous Q8H  . [COMPLETED] magnesium sulfate 1 - 4 g bolus IVPB  2 g Intravenous Once  . multivitamins with iron  1 tablet Oral Daily  . mupirocin ointment  1 application Nasal BID  . ondansetron      . potassium chloride  10 mEq Intravenous Q1 Hr x 4  . [COMPLETED] potassium chloride  10 mEq Intravenous Q1 Hr x 5  . potassium chloride  10 mEq Intravenous Q1 Hr x 3  . [DISCONTINUED] diltiazem  30 mg Oral Q6H   Infusions:    . [EXPIRED] dextrose 5% lactated ringers 50 mL/hr (05/24/12 0800)  . dextrose 5% lactated ringers 1,000 mL (05/24/12 2319)  . diltiazem (CARDIZEM) infusion 10 mg/hr (05/25/12 0229)  . liver oil-zinc oxide Stopped (05/22/12 1515)  . TPN (CLINIMIX) +/- additives 40 mL/hr at 05/24/12 1727    Insulin Requirements in the past 24 hours:  CBG range 76-131 2 unit Novolog sensitive scale  Current Nutrition:  NPO except sips with meds Clinimix E5/20 at 40 ml/hr  IV: D5LR at 15ml/hr  Labs:  Electrolytes: K remains low despite  5 runs of KCl on 2/6, 7 runs ordered today per MD. Corrected Cal wnl. Mag remains low despite repletion 2/5, 2gm MaSO4 ordered today per MD. Phos low/normal. Renal Function: Scr and BUN wnl/stable Hepatic Function: Alk phos elevated at 496 on 1/23 - now improved 189 today, AST/ALT, bilirubin wnl  Pre-Albumin: pending Triglycerides: normal, 123 CBGs: at goal <150   Assessment: 57 yof with chronic ischemic colitis s/p emergency L colectomy 12/2011 presented 05/17/2012 for partial colectomy, takedown of mucous fistula and colostomy reversal. On 2/4, patient found with SBO, intra-abd abscess, and small anastomotic leak. Patient returned to the OR on 2/4 for exploratory laparotomy, lysis of adhesions for SBO, drainage of intra-abd abscess, oversewing of anastomotic leak and loop ileostomy. TNA started on 2/6.  2/7: D#2 of TNA, appears to be showing signs of  re-feeding syndrome since patient has been NPO for a prolonged period of time. Pt requested diet coke today.   Nutritional Goals:  Per RD 2/6: Kcal: 1660-1930, Protein: 76-91grams Clinimix E 5/20@ goal rate of 70 ml/hr + IVF 20% @10  ml/hr on MWF will provide: 84 g protein/day, 1478 kcal on non-lipid days, 1958 kcal on lipid days (average of 1684 kcal/day of the week)  Plan:  At 1800 today:  Increase Clinimix E5/20 to 69ml/hr, slowly advance due to re-feeding  Fat emulsion at 49ml/hr(MWF only due to ongoing shortage).  PO multivitamin with iron ordered, but did not receive yesterday, therefore with add MVI and trace elements to TNA today  Reduce IVF to [facility]   Conitinue Novolog SSI q8h with sensitive scale coverage  TNA lab panels on Mondays & Thursdays.  BMET, Mg and Phos in am  Loralee Pacas, PharmD, BCPS Pager: [phone] 05/25/2012,8:15 AM

## 2012-05-25 NOTE — Progress Notes (Signed)
Subjective:  She is back on IV cardizem this am after having tachycardia last night. Telemetry this am shows what appears to be sinus tach with PACs but I will get a 12 lead to be sure it is not atrial flutter.  Will continue IV cardizem for now. BP satisfactory. She is known to have normal EF 60-65% by previous outside echo 12/19/11. Potassium and magnesium were low last pm.  Objective:  Vital Signs in the last 24 hours: Temp:  [97.9 F (36.6 C)-99.7 F (37.6 C)] 99.7 F (37.6 C) (02/07 0400) Pulse Rate:  [88-124] 113  (02/07 0700) Resp:  [12-19] 14  (02/07 0700) BP: (126-166)/(66-97) 126/67 mmHg (02/07 0700) SpO2:  [94 %-100 %] 100 % (02/07 0700) Weight:  [188 lb 15 oz (85.7 kg)] 188 lb 15 oz (85.7 kg) (02/07 0400)  Intake/Output from previous day: 02/06 0701 - 02/07 0700 In: 1665 [I.V.:915; IV Piggyback:220; TPN:520] Out: 6940 [Urine:6270; Emesis/NG output:500; Drains:20; Stool:150] Intake/Output from this shift:       . Chlorhexidine Gluconate Cloth  6 each Topical Q0600  . enoxaparin (LOVENOX) injection  40 mg Subcutaneous Q24H  . ertapenem  1 g Intravenous Q24H  . insulin aspart  0-9 Units Subcutaneous Q8H  . magnesium sulfate 1 - 4 g bolus IVPB  2 g Intravenous Once  . multivitamins with iron  1 tablet Oral Daily  . mupirocin ointment  1 application Nasal BID  . ondansetron      . potassium chloride  10 mEq Intravenous Q1 Hr x 3      . dextrose 5% lactated ringers 1,000 mL (05/24/12 2319)  . diltiazem (CARDIZEM) infusion 10 mg/hr (05/25/12 0229)  . liver oil-zinc oxide Stopped (05/22/12 1515)  . TPN (CLINIMIX) +/- additives 40 mL/hr at 05/24/12 1727    Physical Exam: The patient appears to be in no distress. She is confused this am after getting ativan last night.  Head and neck exam reveals that the pupils are equal and reactive.  The extraocular movements are full.  There is no scleral icterus.  Mouth and pharynx are benign.  No lymphadenopathy.  No carotid  bruits.  The jugular venous pressure is normal.  Thyroid is not enlarged or tender.  Chest reveals mild rales at bases. Wheezing has resolved.  Heart reveals no abnormal lift or heave.  First and second heart sounds are normal.  There is no murmur gallop rub or click.  The abdomen reveals absent bowel sounds.  Extremities reveal no phlebitis or edema.  Pedal pulses are good.  There is no cyanosis or clubbing.  Neurologic exam is normal strength and no lateralizing weakness.  No sensory deficits. Alert.  Integument reveals no rash  Lab Results:  Basename 05/25/12 0500 05/24/12 0415  WBC 9.1 7.3  HGB 10.0* 8.9*  PLT 300 219    Basename 05/25/12 0500 05/24/12 0415  NA 135 130*  K 3.0* 3.1*  CL 98 98  CO2 27 23  GLUCOSE 117* 144*  BUN 7 7  CREATININE 0.63 0.63   No results found for this basename: TROPONINI:2,CK,MB:2 in the last 72 hours Hepatic Function Panel  Basename 05/25/12 0500  PROT 5.2*  ALBUMIN 1.9*  AST 10  ALT 8  ALKPHOS 189*  BILITOT 0.4  BILIDIR --  IBILI --    Basename 05/25/12 0500  CHOL 122   No results found for this basename: PROTIME in the last 72 hours  Imaging: Imaging results have been reviewed  Cardiac Studies: Telemetry  shows sinus tachy with PACs, ? Intermittent flutter. Assessment/Plan:  1. Fluid overload      Assessment: Diuresed 11 lb since yesterday. Less dyspneic this am 2.  Hypokalemia/hypomagnesemia      Repleted this am by Elink.  Plan: Will replete additional K this am and give additional Lasix this afternoon.           EKG today.           Daily BMET, Mg.  LOS: 8 days    Cassell Clement 05/25/2012, 7:36 AM

## 2012-05-25 NOTE — Progress Notes (Signed)
Hypokalemia, Hypomagnesemia   K and Mg replaced

## 2012-05-25 NOTE — Progress Notes (Signed)
[id number] Earlene Plater, RN, BSN, CCM:  CHART REVIEWED AND UPDATED.  Next chart review due on [id number]. NO DISCHARGE NEEDS PRESENT AT THIS TIME.  Patient continues to have bouts of A. Fib and requiring iv Cardizem. CASE MANAGEMENT [phone]

## 2012-05-25 NOTE — Progress Notes (Signed)
3 Days Post-Op  Subjective: Had some SVT overnight.  Was given some Ativan and is confused because of that.     Objective: Vital signs in last 24 hours: Temp:  [97.9 F (36.6 C)-99.7 F (37.6 C)] 99.7 F (37.6 C) (02/07 0400) Pulse Rate:  [88-124] 113  (02/07 0700) Resp:  [12-19] 14  (02/07 0700) BP: (126-166)/(66-97) 126/67 mmHg (02/07 0700) SpO2:  [94 %-100 %] 100 % (02/07 0700) Weight:  [188 lb 15 oz (85.7 kg)] 188 lb 15 oz (85.7 kg) (02/07 0400) Last BM Date: 05/21/12  Intake/Output from previous day: 02/06 0701 - 02/07 0700 In: 1665 [I.V.:915; IV Piggyback:220; TPN:520] Out: 6940 [Urine:6270; Emesis/NG output:500; Drains:20; Stool:150] Intake/Output this shift:    PE: General- In NAD Abdomen-soft, loop ileostomy pink with thin green liquid output this AM, midline wound open, clean and redressed, LLQ open wound is clean, serous drain output  Lab Results:   Basename 05/25/12 0500 05/24/12 0415  WBC 9.1 7.3  HGB 10.0* 8.9*  HCT 29.6* 25.3*  PLT 300 219   BMET  Basename 05/25/12 0500 05/24/12 0415  NA 135 130*  K 3.0* 3.1*  CL 98 98  CO2 27 23  GLUCOSE 117* 144*  BUN 7 7  CREATININE 0.63 0.63  CALCIUM 7.9* 7.4*   PT/INR No results found for this basename: LABPROT:2,INR:2 in the last 72 hours Comprehensive Metabolic Panel:    Component Value Date/Time   NA 135 05/25/2012 0500   NA 130* 02/09/2012 1321   K 3.0* 05/25/2012 0500   K 4.6 02/09/2012 1321   CL 98 05/25/2012 0500   CL 101 02/09/2012 1321   CO2 27 05/25/2012 0500   CO2 18* 02/09/2012 1321   BUN 7 05/25/2012 0500   BUN 21.0 02/09/2012 1321   CREATININE 0.63 05/25/2012 0500   CREATININE 0.8 02/09/2012 1321   GLUCOSE 117* 05/25/2012 0500   GLUCOSE 79 02/09/2012 1321   CALCIUM 7.9* 05/25/2012 0500   CALCIUM 9.2 02/09/2012 1321   AST 10 05/25/2012 0500   AST 53* 02/09/2012 1321   ALT 8 05/25/2012 0500   ALT 56* 02/09/2012 1321   ALKPHOS 189* 05/25/2012 0500   ALKPHOS 712* 02/09/2012 1321   BILITOT 0.4  05/25/2012 0500   BILITOT 0.50 02/09/2012 1321   PROT 5.2* 05/25/2012 0500   PROT 7.0 02/09/2012 1321   ALBUMIN 1.9* 05/25/2012 0500   ALBUMIN 3.8 02/09/2012 1321     Studies/Results: No results found.  Anti-infectives: Anti-infectives     Start     Dose/Rate Route Frequency Ordered Stop   05/22/12 0800   ertapenem (INVANZ) 1 g in sodium chloride 0.9 % 50 mL IVPB        1 g 100 mL/hr over 30 Minutes Intravenous Every 24 hours 05/22/12 0733     05/17/12 2000   cefoTEtan (CEFOTAN) 2 g in dextrose 5 % 50 mL IVPB        2 g 100 mL/hr over 30 Minutes Intravenous Every 12 hours 05/17/12 1312 05/17/12 2114   05/17/12 0606   cefOXitin (MEFOXIN) 2 g in dextrose 5 % 50 mL IVPB        2 g 100 mL/hr over 30 Minutes Intravenous On call to O.R. 05/17/12 0606 05/17/12 0751          Assessment Active Problems: 1.  Chronic ischemic colitis s/p reversal of colostomy, partial colectomy, takedown of mucous fistula 05/17/12  2.  Postop SBO, microleak from anastomosis and abscess s/p expl lap, loa,  abscess drainage, oversewing of leak, loop ileostomy 05/22/12-on IV InVanz; has an ileus as expected; volume overload improved with Lasix. 3.  Afib-back on Cardizem. 4.  Chronic anemia with ABL- hemoglobin 10.0 5.  Chronic hyponatremia 6.  CMML (chronic myelomonocytic leukemia 7.  Hypomagnesemia-supplement ordered 8.  PC malnutrition-on TPN. 9.  Hypokalemia-supplement ordered.     LOS: 8 days   Plan:   Stop Ativan.  Continue abx, TPN, wound care.  PT/OT consults when she is more lucid.  Moroni Nester J 05/25/2012

## 2012-05-26 LAB — CBC
MCH: 31.9 pg (ref 26.0–34.0)
MCHC: 33.1 g/dL (ref 30.0–36.0)
RDW: 14.4 % (ref 11.5–15.5)

## 2012-05-26 LAB — TYPE AND SCREEN
Donor AG Type: NEGATIVE
Unit division: 0

## 2012-05-26 LAB — BASIC METABOLIC PANEL
BUN: 10 mg/dL (ref 6–23)
Creatinine, Ser: 0.61 mg/dL (ref 0.50–1.10)
GFR calc Af Amer: >90 mL/min (ref >90)
GFR calc non Af Amer: >90 mL/min (ref >90)
Glucose, Bld: 133 mg/dL — ABNORMAL HIGH (ref 70–99)

## 2012-05-26 LAB — MAGNESIUM: Magnesium: 0.9 mg/dL — CL (ref 1.5–2.5)

## 2012-05-26 LAB — GLUCOSE, CAPILLARY
Glucose-Capillary: 129 mg/dL — ABNORMAL HIGH (ref 70–99)
Glucose-Capillary: 130 mg/dL — ABNORMAL HIGH (ref 70–99)

## 2012-05-26 LAB — PHOSPHORUS: Phosphorus: 3.2 mg/dL (ref 2.3–4.6)

## 2012-05-26 MED ORDER — BIOTENE DRY MOUTH MT LIQD
15.0000 mL | Freq: Two times a day (BID) | OROMUCOSAL | Status: DC
  Administered 2012-05-26 – 2012-06-06 (×21): 15 mL via OROMUCOSAL

## 2012-05-26 MED ORDER — CLINIMIX E/DEXTROSE (5/20) 5 % IV SOLN
INTRAVENOUS | Status: AC
  Administered 2012-05-26: 18:00:00 via INTRAVENOUS
  Filled 2012-05-26: qty 2000

## 2012-05-26 MED ORDER — POTASSIUM CHLORIDE 10 MEQ/50ML IV SOLN
10.0000 meq | INTRAVENOUS | Status: AC
  Administered 2012-05-26 (×2): 10 meq via INTRAVENOUS
  Filled 2012-05-26: qty 100

## 2012-05-26 MED ORDER — MAGNESIUM SULFATE 40 MG/ML IJ SOLN
4.0000 g | Freq: Once | INTRAMUSCULAR | Status: AC
  Administered 2012-05-26: 4 g via INTRAVENOUS
  Filled 2012-05-26: qty 100

## 2012-05-26 MED ORDER — POTASSIUM CHLORIDE 10 MEQ/50ML IV SOLN
10.0000 meq | INTRAVENOUS | Status: AC
  Administered 2012-05-26 (×6): 10 meq via INTRAVENOUS
  Filled 2012-05-26: qty 250
  Filled 2012-05-26: qty 50

## 2012-05-26 NOTE — Progress Notes (Signed)
Patient ID: [name], female   DOB: January 22, 1951, 62 y.o.   MRN: [id number] [facility] Surgery Progress Note:   4 Days Post-Op  Subjective: Resting. Extubated but with NG not putting out much.   Objective: Vital signs in last 24 hours: Temp:  [98.3 F (36.8 C)-100.1 F (37.8 C)] 98.8 F (37.1 C) (02/08 0800) Pulse Rate:  [54-120] 106 (02/08 0600) Resp:  [14-22] 15 (02/08 0600) BP: (120-168)/(62-119) 127/68 mmHg (02/08 0600) SpO2:  [95 %-100 %] 100 % (02/08 0600) Weight:  [178 lb 12.7 oz (81.1 kg)] 178 lb 12.7 oz (81.1 kg) (02/08 0600)  Intake/Output from previous day: 02/07 0701 - 02/08 0700 In: 2352.5 [I.V.:412.5; NG/GT:60; IV Piggyback:650; TPN:1230] Out: 6100 [Urine:4825; Emesis/NG output:1250; Drains:25] Intake/Output this shift:    Physical Exam: Work of breathing is not labored.  In sinus rhythm.  JP with minimal output.    Lab Results:  Results for orders placed during the hospital encounter of 05/17/12 (from the past 48 hour(s))  GLUCOSE, CAPILLARY     Status: None   Collection Time    05/24/12  4:56 PM      Result Value Range   Glucose-Capillary 76  70 - 99 mg/dL  GLUCOSE, CAPILLARY     Status: Abnormal   Collection Time    05/25/12 12:29 AM      Result Value Range   Glucose-Capillary 131 (*) 70 - 99 mg/dL   Comment 1 Notify RN    COMPREHENSIVE METABOLIC PANEL     Status: Abnormal   Collection Time    05/25/12  5:00 AM      Result Value Range   Sodium 135  135 - 145 mEq/L   Potassium 3.0 (*) 3.5 - 5.1 mEq/L   Chloride 98  96 - 112 mEq/L   CO2 27  19 - 32 mEq/L   Glucose, Bld 117 (*) 70 - 99 mg/dL   BUN 7  6 - 23 mg/dL   Creatinine, Ser 1.19  0.50 - 1.10 mg/dL   Calcium 7.9 (*) 8.4 - 10.5 mg/dL   Total Protein 5.2 (*) 6.0 - 8.3 g/dL   Albumin 1.9 (*) 3.5 - 5.2 g/dL   AST 10  0 - 37 U/L   ALT 8  0 - 35 U/L   Alkaline Phosphatase 189 (*) 39 - 117 U/L   Total Bilirubin 0.4  0.3 - 1.2 mg/dL   GFR calc non Af Amer >90  >90 mL/min   GFR calc Af Amer  >90  >90 mL/min   Comment:            The eGFR has been calculated     using the CKD EPI equation.     This calculation has not been     validated in all clinical     situations.     eGFR's persistently     <90 mL/min signify     possible Chronic Kidney Disease.  PREALBUMIN     Status: Abnormal   Collection Time    05/25/12  5:00 AM      Result Value Range   Prealbumin 7.8 (*) 17.0 - 34.0 mg/dL  MAGNESIUM     Status: Abnormal   Collection Time    05/25/12  5:00 AM      Result Value Range   Magnesium 1.1 (*) 1.5 - 2.5 mg/dL  PHOSPHORUS     Status: None   Collection Time    05/25/12  5:00 AM  Result Value Range   Phosphorus 2.3  2.3 - 4.6 mg/dL  CHOLESTEROL, TOTAL     Status: None   Collection Time    05/25/12  5:00 AM      Result Value Range   Cholesterol 122  0 - 200 mg/dL  TRIGLYCERIDES     Status: None   Collection Time    05/25/12  5:00 AM      Result Value Range   Triglycerides 123  <150 mg/dL  CBC     Status: Abnormal   Collection Time    05/25/12  5:00 AM      Result Value Range   WBC 9.1  4.0 - 10.5 K/uL   RBC 3.11 (*) 3.87 - 5.11 MIL/uL   Hemoglobin 10.0 (*) 12.0 - 15.0 g/dL   HCT 16.1 (*) 09.6 - 04.5 %   MCV 95.2  78.0 - 100.0 fL   MCH 32.2  26.0 - 34.0 pg   MCHC 33.8  30.0 - 36.0 g/dL   RDW 40.9  81.1 - 91.4 %   Platelets 300  150 - 400 K/uL   Comment: DELTA CHECK NOTED  DIFFERENTIAL     Status: Abnormal   Collection Time    05/25/12  5:00 AM      Result Value Range   Neutrophils Relative 63  43 - 77 %   Lymphocytes Relative 15  12 - 46 %   Monocytes Relative 21 (*) 3 - 12 %   Eosinophils Relative 1  0 - 5 %   Basophils Relative 0  0 - 1 %   Neutro Abs 5.7  1.7 - 7.7 K/uL   Lymphs Abs 1.4  0.7 - 4.0 K/uL   Monocytes Absolute 1.9 (*) 0.1 - 1.0 K/uL   Eosinophils Absolute 0.1  0.0 - 0.7 K/uL   Basophils Absolute 0.0  0.0 - 0.1 K/uL   RBC Morphology POLYCHROMASIA PRESENT     WBC Morphology MILD LEFT SHIFT (1-5% METAS, OCC MYELO, OCC BANDS)      Comment: ATYPICAL LYMPHOCYTES   Smear Review LARGE PLATELETS PRESENT    GLUCOSE, CAPILLARY     Status: Abnormal   Collection Time    05/25/12  7:45 AM      Result Value Range   Glucose-Capillary 133 (*) 70 - 99 mg/dL  GLUCOSE, CAPILLARY     Status: Abnormal   Collection Time    05/25/12  3:15 PM      Result Value Range   Glucose-Capillary 120 (*) 70 - 99 mg/dL  GLUCOSE, CAPILLARY     Status: Abnormal   Collection Time    05/25/12 11:32 PM      Result Value Range   Glucose-Capillary 135 (*) 70 - 99 mg/dL  BASIC METABOLIC PANEL     Status: Abnormal   Collection Time    05/26/12  4:25 AM      Result Value Range   Sodium 135  135 - 145 mEq/L   Potassium 2.6 (*) 3.5 - 5.1 mEq/L   Comment: CRITICAL RESULT CALLED TO, READ BACK BY AND VERIFIED WITH:     Fredderick Severance RN AT [phone] ON [id number] BY DLONG   Chloride 92 (*) 96 - 112 mEq/L   CO2 33 (*) 19 - 32 mEq/L   Glucose, Bld 133 (*) 70 - 99 mg/dL   BUN 10  6 - 23 mg/dL   Creatinine, Ser 8.65  0.50 - 1.10 mg/dL   Calcium 8.2 (*) 8.4 - 10.5  mg/dL   GFR calc non Af Amer >90  >90 mL/min   GFR calc Af Amer >90  >90 mL/min   Comment:            The eGFR has been calculated     using the CKD EPI equation.     This calculation has not been     validated in all clinical     situations.     eGFR's persistently     <90 mL/min signify     possible Chronic Kidney Disease.  CBC     Status: Abnormal   Collection Time    05/26/12  4:25 AM      Result Value Range   WBC 10.6 (*) 4.0 - 10.5 K/uL   RBC 3.07 (*) 3.87 - 5.11 MIL/uL   Hemoglobin 9.8 (*) 12.0 - 15.0 g/dL   HCT 78.4 (*) 69.6 - 29.5 %   MCV 96.4  78.0 - 100.0 fL   MCH 31.9  26.0 - 34.0 pg   MCHC 33.1  30.0 - 36.0 g/dL   RDW 28.4  13.2 - 44.0 %   Platelets 328  150 - 400 K/uL  MAGNESIUM     Status: Abnormal   Collection Time    05/26/12  4:25 AM      Result Value Range   Magnesium 0.9 (*) 1.5 - 2.5 mg/dL   Comment: CRITICAL RESULT CALLED TO, READ BACK BY AND VERIFIED WITH:      Fredderick Severance RN AT 1027 ON [id number] BY DLONG  PHOSPHORUS     Status: None   Collection Time    05/26/12  4:25 AM      Result Value Range   Phosphorus 3.2  2.3 - 4.6 mg/dL  GLUCOSE, CAPILLARY     Status: Abnormal   Collection Time    05/26/12  7:36 AM      Result Value Range   Glucose-Capillary 146 (*) 70 - 99 mg/dL   Comment 1 Documented in Chart     Comment 2 Notify RN      Radiology/Results: No results found.  Anti-infectives: Anti-infectives   Start     Dose/Rate Route Frequency Ordered Stop   05/22/12 0800  ertapenem (INVANZ) 1 g in sodium chloride 0.9 % 50 mL IVPB     1 g 100 mL/hr over 30 Minutes Intravenous Every 24 hours 05/22/12 0733     05/17/12 2000  cefoTEtan (CEFOTAN) 2 g in dextrose 5 % 50 mL IVPB     2 g 100 mL/hr over 30 Minutes Intravenous Every 12 hours 05/17/12 1312 05/17/12 2114   05/17/12 0606  cefOXitin (MEFOXIN) 2 g in dextrose 5 % 50 mL IVPB     2 g 100 mL/hr over 30 Minutes Intravenous On call to O.R. 05/17/12 0606 05/17/12 0751      Assessment/Plan: Problem List: Patient Active Problem List  Diagnosis  . 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 Pure hypercholesterolemia  . Anemia  . Diarrhea  . Dehydration  . Chronic back pain  . Atrial fibrillation with rapid ventricular response  . Partial small bowel obstruction  . Atrial fibrillation  . Chronic ischemic colitis  . Hypotension  . Osteoarthritis of hip  . Postoperative anemia due to acute blood loss  . Chronic hyponatremia  . WBC disease  . Ileus  . CMML (chronic myelomonocytic leukemia)  . Ischemic colitis  . Elevated liver function tests-chronic    Doing well.  May be able  to stop cardiezem drip if remains in sinus.   4 Days Post-Op    LOS: 9 days   Matt B. Daphine Deutscher, MD, [facility] [phone] beeper [phone]  05/26/2012 8:27 AM

## 2012-05-26 NOTE — Progress Notes (Signed)
Subjective:  Still with Sinus tach with PAC. HR 100-110.   Objective:  Vital Signs in the last 24 hours: Temp:  [98.3 F (36.8 C)-100.1 F (37.8 C)] 98.8 F (37.1 C) (02/08 0800) Pulse Rate:  [54-120] 106 (02/08 0600) Resp:  [14-22] 15 (02/08 0600) BP: (120-168)/(62-119) 127/68 mmHg (02/08 0600) SpO2:  [95 %-100 %] 100 % (02/08 0600) Weight:  [81.1 kg (178 lb 12.7 oz)] 81.1 kg (178 lb 12.7 oz) (02/08 0600)  Intake/Output from previous day: 02/07 0701 - 02/08 0700 In: 2367.5 [I.V.:427.5; NG/GT:60; IV Piggyback:650; TPN:1230] Out: 6100 [Urine:4825; Emesis/NG output:1250; Drains:25]   Physical Exam: General: Moderately ill appearing, NGTin no acute distress. Head:  Normocephalic and atraumatic. Lungs: Mild wheeze B. Heart: Tachy RR.  No murmur, rubs or gallops.  Abdomen: soft, non-tender, positive bowel sounds. Extremities: No clubbing or cyanosis. Trace edema. SCD Neurologic: Alert and oriented x 3.    Lab Results:  Recent Labs  05/25/12 0500 05/26/12 0425  WBC 9.1 10.6*  HGB 10.0* 9.8*  PLT 300 328    Recent Labs  05/25/12 0500 05/26/12 0425  NA 135 135  K 3.0* 2.6*  CL 98 92*  CO2 27 33*  GLUCOSE 117* 133*  BUN 7 10  CREATININE 0.63 0.61   No results found for this basename: TROPONINI, CK, MB,  in the last 72 hours Hepatic Function Panel  Recent Labs  05/25/12 0500  PROT 5.2*  ALBUMIN 1.9*  AST 10  ALT 8  ALKPHOS 189*  BILITOT 0.4    Recent Labs  05/25/12 0500  CHOL 122  .   Telemetry: Sinus tachy with PAC Personally viewed.    Assessment/Plan:  Active Problems:   Chronic ischemic colitis   Chronic hyponatremia   CMML (chronic myelomonocytic leukemia)  1. Sinus tachycardia - continue with diltiazem IV for now. Doing well. Tachycardia likely compensatory from underlying illness. Dilt will help with BP as well.   2. PAC's - as above. Dilt.   Will continue to follow.    Kiyra Slaubaugh 05/26/2012, 9:21 AM

## 2012-05-26 NOTE — Progress Notes (Signed)
PARENTERAL NUTRITION CONSULT NOTE - FOLLOW UP  Pharmacy Consult for TNA Indication: Prolonged ileus  Allergies  Allergen Reactions  . Vancomycin Hives and Rash    ? wheezing  . Codeine Nausea And Vomiting  . Tetanus Toxoids Other (See Comments)    serum  . Penicillins Hives and Rash  . Xarelto (Rivaroxaban) Hives and Rash    ?    Patient Measurements: Height: 5\' 5"  (165.1 cm) Weight: 178 lb 12.7 oz (81.1 kg) IBW/kg (Calculated) : 57 Adjusted Body Weight: 65.6kg Usual Weight: 75.4kg  Vital Signs: Temp: 100.1 F (37.8 C) (02/08 0400) Temp src: Oral (02/08 0400) BP: 127/68 mmHg (02/08 0600) Pulse Rate: 106 (02/08 0600) Intake/Output from previous day: 02/07 0701 - 02/08 0700 In: 2352.5 [I.V.:412.5; NG/GT:60; IV Piggyback:650; TPN:1230] Out: 6100 [Urine:4825; Emesis/NG output:1250; Drains:25] Intake/Output from this shift:    Labs:  Recent Labs  05/24/12 0415 05/25/12 0500 05/26/12 0425  WBC 7.3 9.1 10.6*  HGB 8.9* 10.0* 9.8*  HCT 25.3* 29.6* 29.6*  PLT 219 300 328     Recent Labs  05/24/12 0415 05/25/12 0500 05/26/12 0425  NA 130* 135 135  K 3.1* 3.0* 2.6*  CL 98 98 92*  CO2 23 27 33*  GLUCOSE 144* 117* 133*  BUN 7 7 10   CREATININE 0.63 0.63 0.61  CALCIUM 7.4* 7.9* 8.2*  MG 1.7 1.1* 0.9*  PHOS  --  2.3 3.2  PROT  --  5.2*  --   ALBUMIN  --  1.9*  --   AST  --  10  --   ALT  --  8  --   ALKPHOS  --  189*  --   BILITOT  --  0.4  --   PREALBUMIN  --  7.8*  --   TRIG  --  123  --   CHOL  --  122  --   Corrected calcium = 9.58 (2/7) Estimated Creatinine Clearance: 75.7 ml/min (by C-G formula based on Cr of 0.61).    Recent Labs  05/25/12 0745 05/25/12 1515 05/25/12 2332  GLUCAP 133* 120* 135*    Medications:  Scheduled:  . Chlorhexidine Gluconate Cloth  6 each Topical Q0600  . enoxaparin (LOVENOX) injection  40 mg Subcutaneous Q24H  . ertapenem  1 g Intravenous Q24H  . [COMPLETED] furosemide  40 mg Intravenous Once  . insulin  aspart  0-9 Units Subcutaneous Q8H  . [COMPLETED] magnesium sulfate 1 - 4 g bolus IVPB  2 g Intravenous Once  . magnesium sulfate 1 - 4 g bolus IVPB  4 g Intravenous Once  . multivitamins with iron  1 tablet Oral Daily  . mupirocin ointment  1 application Nasal BID  . [EXPIRED] ondansetron      . [COMPLETED] potassium chloride  10 mEq Intravenous Q1 Hr x 4  . [COMPLETED] potassium chloride  10 mEq Intravenous Q1 Hr x 3  . potassium chloride  10 mEq Intravenous Q1 Hr x 6  . potassium chloride  10 mEq Intravenous Q1 Hr x 2   Infusions:  . dextrose 5% lactated ringers 10 mL/hr (05/25/12 2000)  . diltiazem (CARDIZEM) infusion 15 mg/hr (05/25/12 2042)  . fat emulsion 250 mL (05/25/12 1719)  . liver oil-zinc oxide Stopped (05/22/12 1515)  . [EXPIRED] TPN (CLINIMIX) +/- additives 40 mL/hr at 05/24/12 1727  . TPN (CLINIMIX) +/- additives 50 mL/hr at 05/25/12 1719    Insulin Requirements in the past 24 hours:  CBG range 120-135 2 unit Novolog sensitive  scale  Current Nutrition:  NPO except sips with meds Clinimix E5/20 at 50 ml/hr Lipids at 10 ml/hr  IV: D5LR at [facility]  Labs:  Electrolytes: K remains low despite aggressive replacement - 8 runs KCl today per MD. Jodelle Green remains low despite repletion - 4gm MgSO4 today per MD. Phos & Corrected Ca wnl. Renal Function: Scr and BUN wnl/stable Hepatic Function: Alk phos elevated at 496 on 1/23 - now improved 189, AST/ALT, bilirubin wnl (2/7)  Pre-Albumin: low, 7.8 Triglycerides: normal, 123 CBGs: at goal <150   Assessment: 89 yof with chronic ischemic colitis s/p emergency L colectomy 12/2011 presented 05/17/2012 for partial colectomy, takedown of mucous fistula and colostomy reversal. On 2/4, patient found with SBO, intra-abd abscess, and small anastomotic leak. Patient returned to the OR on 2/4 for exploratory laparotomy, lysis of adhesions for SBO, drainage of intra-abd abscess, oversewing of anastomotic leak and loop ileostomy. TNA started on  2/6.  2/8: D#3 of TNA, appears to be showing signs of re-feeding syndrome since patient has been NPO for a prolonged period of time.   Nutritional Goals:  Per RD 2/6: Kcal: 1660-1930, Protein: 76-91grams Clinimix E 5/20@ goal rate of 70 ml/hr + IVF 20% @10  ml/hr on MWF will provide: 84 g protein/day, 1478 kcal on non-lipid days, 1958 kcal on lipid days (average of 1684 kcal/day of the week)  Plan:  At 1800 today:  Keep Clinimix E5/20 at 11ml/hr today - will not advance due to re-feeding, electrolyte abnormalities  MgSO4 4gm and KCl x 8 today per MD  Fat emulsion at 39ml/hr(MWF only due to ongoing shortage).  PO multivitamin with iron ordered, but did not able to tolerated, therefore with add MVI and trace elements to TNA MWF due to ongoing shortage  [facility]F at [facility]   Conitinue Novolog SSI q8h with sensitive scale coverage  TNA lab panels on Mondays & Thursdays.  BMET, Mg and Phos in am  Loralee Pacas, PharmD, BCPS Pager: [phone] 05/26/2012,7:11 AM

## 2012-05-27 LAB — BASIC METABOLIC PANEL
CO2: 33 mEq/L — ABNORMAL HIGH (ref 19–32)
Calcium: 7.8 mg/dL — ABNORMAL LOW (ref 8.4–10.5)
Creatinine, Ser: 0.55 mg/dL (ref 0.50–1.10)
GFR calc non Af Amer: >90 mL/min (ref >90)
Glucose, Bld: 139 mg/dL — ABNORMAL HIGH (ref 70–99)
Sodium: 130 mEq/L — ABNORMAL LOW (ref 135–145)

## 2012-05-27 LAB — GLUCOSE, CAPILLARY

## 2012-05-27 LAB — PHOSPHORUS: Phosphorus: 2.9 mg/dL (ref 2.3–4.6)

## 2012-05-27 MED ORDER — MAGNESIUM SULFATE 40 MG/ML IJ SOLN
2.0000 g | Freq: Once | INTRAMUSCULAR | Status: AC
  Administered 2012-05-27: 2 g via INTRAVENOUS
  Filled 2012-05-27: qty 50

## 2012-05-27 MED ORDER — POTASSIUM CHLORIDE 10 MEQ/50ML IV SOLN
10.0000 meq | INTRAVENOUS | Status: AC
  Administered 2012-05-27 (×6): 10 meq via INTRAVENOUS
  Filled 2012-05-27 (×6): qty 50

## 2012-05-27 MED ORDER — PANTOPRAZOLE SODIUM 40 MG IV SOLR
40.0000 mg | Freq: Every day | INTRAVENOUS | Status: DC
  Administered 2012-05-27 – 2012-06-02 (×7): 40 mg via INTRAVENOUS
  Filled 2012-05-27 (×7): qty 40

## 2012-05-27 MED ORDER — CHLORHEXIDINE GLUCONATE CLOTH 2 % EX PADS: 6.0000 | MEDICATED_PAD | Freq: Every day | CUTANEOUS | Status: AC

## 2012-05-27 MED ORDER — CLINIMIX E/DEXTROSE (5/20) 5 % IV SOLN
INTRAVENOUS | Status: AC
  Administered 2012-05-27: 18:00:00 via INTRAVENOUS
  Filled 2012-05-27: qty 2000

## 2012-05-27 MED ORDER — CELECOXIB 200 MG PO CAPS
200.0000 mg | ORAL_CAPSULE | Freq: Every day | ORAL | Status: DC
  Administered 2012-05-27 – 2012-06-06 (×11): 200 mg via ORAL
  Filled 2012-05-27 (×11): qty 1

## 2012-05-27 NOTE — Progress Notes (Signed)
PARENTERAL NUTRITION CONSULT NOTE - FOLLOW UP  Pharmacy Consult for TNA Indication: Prolonged ileus  Allergies  Allergen Reactions  . Vancomycin Hives and Rash    ? wheezing  . Codeine Nausea And Vomiting  . Tetanus Toxoids Other (See Comments)    serum  . Penicillins Hives and Rash  . Xarelto (Rivaroxaban) Hives and Rash    ?    Patient Measurements: Height: 5\' 5"  (165.1 cm) Weight: 179 lb (81.194 kg) IBW/kg (Calculated) : 57 Adjusted Body Weight: 65.6kg Usual Weight: 75.4kg  Vital Signs: Temp: 99.4 F (37.4 C) (02/09 0400) Temp src: Oral (02/09 0400) BP: 138/74 mmHg (02/09 0200) Pulse Rate: 109 (02/09 0200) Intake/Output from previous day: 02/08 0701 - 02/09 0700 In: 2545 [P.O.:60; I.V.:785; IV Piggyback:460; TPN:1240] Out: 1850 [Urine:1575; Emesis/NG output:100; Stool:175] Intake/Output from this shift:    Labs:  Recent Labs  05/25/12 0500 05/26/12 0425  WBC 9.1 10.6*  HGB 10.0* 9.8*  HCT 29.6* 29.6*  PLT 300 328     Recent Labs  05/25/12 0500 05/26/12 0425 05/27/12 0530  NA 135 135 130*  K 3.0* 2.6* 3.0*  CL 98 92* 91*  CO2 27 33* 33*  GLUCOSE 117* 133* 139*  BUN 7 10 11   CREATININE 0.63 0.61 0.55  CALCIUM 7.9* 8.2* 7.8*  MG 1.1* 0.9* 1.2*  PHOS 2.3 3.2 2.9  PROT 5.2*  --   --   ALBUMIN 1.9*  --   --   AST 10  --   --   ALT 8  --   --   ALKPHOS 189*  --   --   BILITOT 0.4  --   --   PREALBUMIN 7.8*  --   --   TRIG 123  --   --   CHOL 122  --   --   Corrected calcium = 9.58 (2/7) Estimated Creatinine Clearance: 75.8 ml/min (by C-G formula based on Cr of 0.55).    Recent Labs  05/26/12 1234 05/26/12 1559 05/27/12 0024  GLUCAP 129* 130* 120*    Medications:  Scheduled:  . antiseptic oral rinse  15 mL Mouth Rinse BID  . Chlorhexidine Gluconate Cloth  6 each Topical Q0600  . enoxaparin (LOVENOX) injection  40 mg Subcutaneous Q24H  . ertapenem  1 g Intravenous Q24H  . insulin aspart  0-9 Units Subcutaneous Q8H  . magnesium  sulfate 1 - 4 g bolus IVPB  2 g Intravenous Once  . [COMPLETED] magnesium sulfate 1 - 4 g bolus IVPB  4 g Intravenous Once  . multivitamins with iron  1 tablet Oral Daily  . mupirocin ointment  1 application Nasal BID  . [COMPLETED] potassium chloride  10 mEq Intravenous Q1 Hr x 6  . [COMPLETED] potassium chloride  10 mEq Intravenous Q1 Hr x 2  . potassium chloride  10 mEq Intravenous Q1 Hr x 6   Infusions:  . dextrose 5% lactated ringers 10 mL/hr (05/25/12 2000)  . diltiazem (CARDIZEM) infusion 15 mg/hr (05/27/12 0600)  . [EXPIRED] fat emulsion 250 mL (05/25/12 1719)  . liver oil-zinc oxide Stopped (05/22/12 1515)  . [EXPIRED] TPN (CLINIMIX) +/- additives 50 mL/hr at 05/25/12 1719  . TPN (CLINIMIX) +/- additives 50 mL/hr at 05/26/12 1826    Insulin Requirements in the past 24 hours:  CBG range 120-135 2 unit Novolog sensitive scale  Current Nutrition:  NPO except sips with meds, diet coke Clinimix E5/20 at 50 ml/hr Lipids at 10 ml/hr MWF  IV: D[facility]LR [facility]  Labs:  Electrolytes: K and Mg remain low despite aggressive replacement; Na low (unable to adjust concentration of lytes in TNA). Phos & Corrected Ca wnl. Renal Function: Scr and BUN wnl/stable Hepatic Function: Alk phos elevated at 496 on 1/23 - now improved 189, AST/ALT, bilirubin wnl (2/7)  Pre-Albumin: low, 7.8, recheck in am Triglycerides: normal, 123, recheck in am CBGs: at goal <150   Assessment: 84 yof with chronic ischemic colitis s/p emergency L colectomy 12/2011 presented 05/17/2012 for partial colectomy, takedown of mucous fistula and colostomy reversal. On 2/4, patient found with SBO, intra-abd abscess, and small anastomotic leak. Patient returned to the OR on 2/4 for exploratory laparotomy, lysis of adhesions for SBO, drainage of intra-abd abscess, oversewing of anastomotic leak and loop ileostomy. TNA started on 2/6.  2/9: D#4 of TNA, appears to be showing signs of re-feeding syndrome since patient has been NPO  for a prolonged period of time.   Nutritional Goals:  Per RD 2/6: Kcal: 1660-1930, Protein: 76-91grams Clinimix E 5/20@ goal rate of 70 ml/hr + IVF 20% @10  ml/hr on MWF will provide: 84 g protein/day, 1478 kcal on non-lipid days, 1958 kcal on lipid days (average of 1684 kcal/day of the week)  Plan:  At 1800 today:  Advance Clinimix E5/20 to 53ml/hr today - advancing slowly due to re-feeding, electrolyte abnormalities  MgSO4 2gm and KCl x 6 today   Fat emulsion at 80ml/hr(MWF only due to ongoing shortage).  PO multivitamin with iron ordered, but did not able to tolerate, therefore with add MVI and trace elements to TNA MWF due to ongoing shortage  Continue IVF at [facility] - consider changing to NS if sodium remains low  Conitinue Novolog SSI q8h with sensitive scale coverage  TNA lab panels on Mondays & Thursdays.   Loralee Pacas, PharmD, BCPS Pager: [phone] 05/27/2012,7:21 AM

## 2012-05-27 NOTE — Progress Notes (Signed)
Patient ID: [name], female   DOB: Apr 28, 1950, 62 y.o.   MRN: [id number] [facility] Progress Note:   5 Days Post-Op  Subjective: Mental status is clear.  No complaints.  Asked about Protonix Objective: Vital signs in last 24 hours: Temp:  [98.1 F (36.7 C)-99.8 F (37.7 C)] 99.4 F (37.4 C) (02/09 0400) Pulse Rate:  [88-189] 109 (02/09 0200) Resp:  [13-25] 14 (02/09 0200) BP: (107-148)/(52-76) 138/74 mmHg (02/09 0200) SpO2:  [90 %-100 %] 96 % (02/09 0200) Weight:  [179 lb (81.194 kg)] 179 lb (81.194 kg) (02/09 0256)  Intake/Output from previous day: 02/08 0701 - 02/09 0700 In: 2545 [P.O.:60; I.V.:785; IV Piggyback:460; TPN:1240] Out: 1850 [Urine:1575; Emesis/NG output:100; Stool:175] Intake/Output this shift:    Physical Exam: Work of breathing is not elevated.  Ostomy with liquid greenish brown material.  JP serous.  Lab Results:  Results for orders placed during the hospital encounter of 05/17/12 (from the past 48 hour(s))  GLUCOSE, CAPILLARY     Status: None   Collection Time    05/25/12 11:05 AM      Result Value Range   Glucose-Capillary 97  70 - 99 mg/dL  GLUCOSE, CAPILLARY     Status: Abnormal   Collection Time    05/25/12  3:15 PM      Result Value Range   Glucose-Capillary 120 (*) 70 - 99 mg/dL  GLUCOSE, CAPILLARY     Status: Abnormal   Collection Time    05/25/12 11:32 PM      Result Value Range   Glucose-Capillary 135 (*) 70 - 99 mg/dL  BASIC METABOLIC PANEL     Status: Abnormal   Collection Time    05/26/12  4:25 AM      Result Value Range   Sodium 135  135 - 145 mEq/L   Potassium 2.6 (*) 3.5 - 5.1 mEq/L   Comment: CRITICAL RESULT CALLED TO, READ BACK BY AND VERIFIED WITH:     Fredderick Severance RN AT [id number] ON [id number] BY DLONG   Chloride 92 (*) 96 - 112 mEq/L   CO2 33 (*) 19 - 32 mEq/L   Glucose, Bld 133 (*) 70 - 99 mg/dL   BUN 10  6 - 23 mg/dL   Creatinine, Ser 8.29  0.50 - 1.10 mg/dL   Calcium 8.2 (*) 8.4 - 10.5 mg/dL   GFR calc non Af  Amer >90  >90 mL/min   GFR calc Af Amer >90  >90 mL/min   Comment:            The eGFR has been calculated     using the CKD EPI equation.     This calculation has not been     validated in all clinical     situations.     eGFR's persistently     <90 mL/min signify     possible Chronic Kidney Disease.  CBC     Status: Abnormal   Collection Time    05/26/12  4:25 AM      Result Value Range   WBC 10.6 (*) 4.0 - 10.5 K/uL   RBC 3.07 (*) 3.87 - 5.11 MIL/uL   Hemoglobin 9.8 (*) 12.0 - 15.0 g/dL   HCT 56.2 (*) 13.0 - 86.5 %   MCV 96.4  78.0 - 100.0 fL   MCH 31.9  26.0 - 34.0 pg   MCHC 33.1  30.0 - 36.0 g/dL   RDW 78.4  69.6 - 29.5 %   Platelets  328  150 - 400 K/uL  MAGNESIUM     Status: Abnormal   Collection Time    05/26/12  4:25 AM      Result Value Range   Magnesium 0.9 (*) 1.5 - 2.5 mg/dL   Comment: CRITICAL RESULT CALLED TO, READ BACK BY AND VERIFIED WITH:     Fredderick Severance RN AT [id number] ON [id number] BY DLONG  PHOSPHORUS     Status: None   Collection Time    05/26/12  4:25 AM      Result Value Range   Phosphorus 3.2  2.3 - 4.6 mg/dL  GLUCOSE, CAPILLARY     Status: Abnormal   Collection Time    05/26/12  7:36 AM      Result Value Range   Glucose-Capillary 146 (*) 70 - 99 mg/dL   Comment 1 Documented in Chart     Comment 2 Notify RN    GLUCOSE, CAPILLARY     Status: Abnormal   Collection Time    05/26/12 12:34 PM      Result Value Range   Glucose-Capillary 129 (*) 70 - 99 mg/dL   Comment 1 Documented in Chart     Comment 2 Notify RN    GLUCOSE, CAPILLARY     Status: Abnormal   Collection Time    05/26/12  3:59 PM      Result Value Range   Glucose-Capillary 130 (*) 70 - 99 mg/dL  GLUCOSE, CAPILLARY     Status: Abnormal   Collection Time    05/27/12 12:24 AM      Result Value Range   Glucose-Capillary 120 (*) 70 - 99 mg/dL  BASIC METABOLIC PANEL     Status: Abnormal   Collection Time    05/27/12  5:30 AM      Result Value Range   Sodium 130 (*) 135 - 145 mEq/L    Potassium 3.0 (*) 3.5 - 5.1 mEq/L   Chloride 91 (*) 96 - 112 mEq/L   CO2 33 (*) 19 - 32 mEq/L   Glucose, Bld 139 (*) 70 - 99 mg/dL   BUN 11  6 - 23 mg/dL   Creatinine, Ser 8.65  0.50 - 1.10 mg/dL   Calcium 7.8 (*) 8.4 - 10.5 mg/dL   GFR calc non Af Amer >90  >90 mL/min   GFR calc Af Amer >90  >90 mL/min   Comment:            The eGFR has been calculated     using the CKD EPI equation.     This calculation has not been     validated in all clinical     situations.     eGFR's persistently     <90 mL/min signify     possible Chronic Kidney Disease.  MAGNESIUM     Status: Abnormal   Collection Time    05/27/12  5:30 AM      Result Value Range   Magnesium 1.2 (*) 1.5 - 2.5 mg/dL  PHOSPHORUS     Status: None   Collection Time    05/27/12  5:30 AM      Result Value Range   Phosphorus 2.9  2.3 - 4.6 mg/dL    Radiology/Results: No results found.  Anti-infectives: Anti-infectives   Start     Dose/Rate Route Frequency Ordered Stop   05/22/12 0800  ertapenem (INVANZ) 1 g in sodium chloride 0.9 % 50 mL IVPB     1 g 100 mL/hr over  30 Minutes Intravenous Every 24 hours 05/22/12 0733     05/17/12 2000  cefoTEtan (CEFOTAN) 2 g in dextrose 5 % 50 mL IVPB     2 g 100 mL/hr over 30 Minutes Intravenous Every 12 hours 05/17/12 1312 05/17/12 2114   05/17/12 0606  cefOXitin (MEFOXIN) 2 g in dextrose 5 % 50 mL IVPB     2 g 100 mL/hr over 30 Minutes Intravenous On call to O.R. 05/17/12 0606 05/17/12 0751      Assessment/Plan: Problem List: Patient Active Problem List  Diagnosis  . 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 Pure hypercholesterolemia  . Anemia  . Diarrhea  . Dehydration  . Chronic back pain  . Atrial fibrillation with rapid ventricular response  . Partial small bowel obstruction  . Atrial fibrillation  . Chronic ischemic colitis  . Hypotension  . Osteoarthritis of hip  . Postoperative anemia due to acute blood loss  . Chronic hyponatremia  . WBC  disease  . Ileus  . CMML (chronic myelomonocytic leukemia)  . Ischemic colitis  . Elevated liver function tests-chronic    Heart rate at 110 with Cardiezem drip.  Will advance to clear liquid diet.  Protonix restarted.  Doing better.  5 Days Post-Op    LOS: 10 days   Matt B. Daphine Deutscher, MD, [facility] [phone] beeper [phone]  05/27/2012 7:52 AM

## 2012-05-27 NOTE — Progress Notes (Signed)
Subjective:  Feels mild shortness of breath when trying to move around the bed otherwise no chest pain, no other specific complaints.  Objective:  Vital Signs in the last 24 hours: Temp:  [98.1 F (36.7 C)-99.8 F (37.7 C)] 99.6 F (37.6 C) (02/09 0800) Pulse Rate:  [88-189] 109 (02/09 0200) Resp:  [13-25] 14 (02/09 0200) BP: (107-148)/(64-76) 138/74 mmHg (02/09 0200) SpO2:  [90 %-100 %] 96 % (02/09 0200) Weight:  [81.194 kg (179 lb)] 81.194 kg (179 lb) (02/09 0256)  Intake/Output from previous day: 02/08 0701 - 02/09 0700 In: 2545 [P.O.:60; I.V.:785; IV Piggyback:460; TPN:1240] Out: 1850 [Urine:1575; Emesis/NG output:100; Stool:175]   Physical Exam: General: Well developed, well nourished, in no acute distress. Head:  Normocephalic and atraumatic. Lungs: Clear to auscultation and percussion. Heart: Tachycardic with occasional ectopy.  No murmur, rubs or gallops.  Abdomen: soft, non-tender, positive bowel sounds. Extremities: No clubbing or cyanosis. Tra andce edema. SCD's Neurologic: Alert and oriented x 3.    Lab Results:  Recent Labs  05/25/12 0500 05/26/12 0425  WBC 9.1 10.6*  HGB 10.0* 9.8*  PLT 300 328    Recent Labs  05/26/12 0425 05/27/12 0530  NA 135 130*  K 2.6* 3.0*  CL 92* 91*  CO2 33* 33*  GLUCOSE 133* 139*  BUN 10 11  CREATININE 0.61 0.55   No results found for this basename: TROPONINI, CK, MB,  in the last 72 hours Hepatic Function Panel  Recent Labs  05/25/12 0500  PROT 5.2*  ALBUMIN 1.9*  AST 10  ALT 8  ALKPHOS 189*  BILITOT 0.4    Recent Labs  05/25/12 0500  CHOL 122     Telemetry: Sinus rhythm/sinus tachycardia with frequent PACs heart rate 100 - 110 Personally viewed.    Assessment/Plan:  Active Problems:   Chronic ischemic colitis   Chronic hyponatremia   CMML (chronic myelomonocytic leukemia)   62 year old with selective colostomy reversal for acute ischemic colitis in September of 2013 with tachycardia.  1.  Tachycardia-appears to be sinus tachycardia with frequent PACs. Continue with diltiazem IV drip. When she is able, transfer to by mouth diltiazem.  2. PACs-diltiazem as above.  3. Hypertension-diltiazem is helping with this as well.  4. Hypokalemia-repleting. SKAINS, MARK 05/27/2012, 9:06 AM

## 2012-05-28 DIAGNOSIS — I498 Other specified cardiac arrhythmias: Secondary | ICD-10-CM

## 2012-05-28 LAB — DIFFERENTIAL
Basophils Relative: 0 % (ref 0–1)
Eosinophils Absolute: 0.1 10*3/uL (ref 0.0–0.7)
Lymphs Abs: 1.5 10*3/uL (ref 0.7–4.0)
Monocytes Absolute: 2.8 10*3/uL — ABNORMAL HIGH (ref 0.1–1.0)
Neutro Abs: 5.9 10*3/uL (ref 1.7–7.7)

## 2012-05-28 LAB — COMPREHENSIVE METABOLIC PANEL
AST: 12 U/L (ref 0–37)
Albumin: 1.8 g/dL — ABNORMAL LOW (ref 3.5–5.2)
Alkaline Phosphatase: 234 U/L — ABNORMAL HIGH (ref 39–117)
BUN: 12 mg/dL (ref 6–23)
CO2: 32 mEq/L (ref 19–32)
Chloride: 90 mEq/L — ABNORMAL LOW (ref 96–112)
GFR calc non Af Amer: >90 mL/min (ref >90)
Potassium: 3.1 mEq/L — ABNORMAL LOW (ref 3.5–5.1)
Total Bilirubin: 0.3 mg/dL (ref 0.3–1.2)

## 2012-05-28 LAB — CBC
HCT: 26 % — ABNORMAL LOW (ref 36.0–46.0)
Hemoglobin: 8.8 g/dL — ABNORMAL LOW (ref 12.0–15.0)
MCH: 32.7 pg (ref 26.0–34.0)
MCHC: 33.8 g/dL (ref 30.0–36.0)
MCV: 96.7 fL (ref 78.0–100.0)

## 2012-05-28 LAB — GLUCOSE, CAPILLARY
Glucose-Capillary: 124 mg/dL — ABNORMAL HIGH (ref 70–99)
Glucose-Capillary: 131 mg/dL — ABNORMAL HIGH (ref 70–99)

## 2012-05-28 LAB — TRIGLYCERIDES: Triglycerides: 105 mg/dL (ref <150)

## 2012-05-28 MED ORDER — POTASSIUM CHLORIDE 10 MEQ/50ML IV SOLN
10.0000 meq | INTRAVENOUS | Status: AC
  Administered 2012-05-28 (×6): 10 meq via INTRAVENOUS
  Filled 2012-05-28 (×5): qty 50

## 2012-05-28 MED ORDER — MAGNESIUM SULFATE 40 MG/ML IJ SOLN
4.0000 g | Freq: Once | INTRAMUSCULAR | Status: AC
  Administered 2012-05-28: 4 g via INTRAVENOUS
  Filled 2012-05-28: qty 100

## 2012-05-28 MED ORDER — BISOPROLOL FUMARATE 5 MG PO TABS
5.0000 mg | ORAL_TABLET | Freq: Every day | ORAL | Status: DC
  Administered 2012-05-28 – 2012-06-06 (×10): 5 mg via ORAL
  Filled 2012-05-28 (×10): qty 1

## 2012-05-28 MED ORDER — FAT EMULSION 20 % IV EMUL
250.0000 mL | INTRAVENOUS | Status: AC
  Administered 2012-05-28: 250 mL via INTRAVENOUS
  Filled 2012-05-28: qty 250

## 2012-05-28 MED ORDER — VITAMINS A & D EX OINT
TOPICAL_OINTMENT | CUTANEOUS | Status: AC
  Administered 2012-05-28: 18:00:00
  Filled 2012-05-28: qty 5

## 2012-05-28 MED ORDER — CLINIMIX E/DEXTROSE (5/20) 5 % IV SOLN
INTRAVENOUS | Status: AC
  Administered 2012-05-28: 17:00:00 via INTRAVENOUS
  Filled 2012-05-28: qty 2000

## 2012-05-28 NOTE — Evaluation (Signed)
Occupational Therapy Evaluation Patient Details Name: Ronnald Collum Skilton MRN: [id number] DOB: January 29, 1951 Today's Date: 05/28/2012 Time: 4098-1191 OT Time Calculation (min): 27 min  OT Assessment / Plan / Recommendation Clinical Impression  62 yo female who presents with chronic ischemic colitis s/p reversal of colostomy, partial colectomy, takedown of mucous fistula 05/17/12. Pt appeared mildly confused at times per RN likely 2* pain meds. Skilled OT indicated to maximize independence with BADLs to setup/min A level in prepfor d/c to next venue.    OT Assessment  Patient needs continued OT Services    Follow Up Recommendations  SNF;Home health OT (depending on progress.)    Barriers to Discharge      Equipment Recommendations  3 in 1 bedside comode    Recommendations for Other Services    Frequency  Min 2X/week    Precautions / Restrictions Precautions Precautions: Fall Precaution Comments: partial colectomy, JP drain from ostomy takedown, monitor O2 Restrictions Weight Bearing Restrictions: No   Pertinent Vitals/Pain Pt reported 8/10 abdominal pain. Repositioned for comfort.    ADL  Grooming: Set up Where Assessed - Grooming: Supported sitting Upper Body Bathing: Minimal assistance Where Assessed - Upper Body Bathing: Supported sitting Lower Body Bathing: Moderate assistance Where Assessed - Lower Body Bathing: Supported sit to stand Upper Body Dressing: Minimal assistance Where Assessed - Upper Body Dressing: Supported sitting Lower Body Dressing: Maximal assistance Where Assessed - Lower Body Dressing: Unsupported sit to stand Toilet Transfer: Minimal assistance Toilet Transfer Method: Sit to stand Toilet Transfer Equipment: Other (comment) (recliner) Toileting - Clothing Manipulation and Hygiene: Moderate assistance Where Assessed - Toileting Clothing Manipulation and Hygiene: Standing ADL Comments: Initially requested pt stand at sink to brush teeth but pt felt she  needed to sit and do it 2* dizziness.  Requires +2 A 2* lines and drains. Fatigues quickly.    OT Diagnosis: Generalized weakness  OT Problem List: Decreased activity tolerance;Decreased safety awareness;Decreased cognition;Decreased knowledge of use of DME or AE OT Treatment Interventions: Self-care/ADL training;Therapeutic activities;DME and/or AE instruction;Patient/family education   OT Goals Acute Rehab OT Goals OT Goal Formulation: With patient Time For Goal Achievement: 06/11/12 Potential to Achieve Goals: Good ADL Goals Pt Will Perform Grooming: with supervision;Standing at sink ADL Goal: Grooming - Progress: Goal set today Pt Will Perform Upper Body Bathing: with set-up;Sitting, chair;Sitting, edge of bed;Unsupported ADL Goal: Upper Body Bathing - Progress: Goal set today Pt Will Perform Lower Body Bathing: with min assist;Sit to stand from chair;Sit to stand from bed ADL Goal: Lower Body Bathing - Progress: Goal set today Pt Will Perform Upper Body Dressing: with set-up;Unsupported;Sitting, chair;Sitting, bed ADL Goal: Upper Body Dressing - Progress: Goal set today Pt Will Perform Lower Body Dressing: with min assist;Sit to stand from chair;Sit to stand from bed ADL Goal: Lower Body Dressing - Progress: Goal set today Pt Will Transfer to Toilet: with supervision;Ambulation;with DME ADL Goal: Toilet Transfer - Progress: Goal set today Pt Will Perform Toileting - Clothing Manipulation: with supervision;Sitting on 3-in-1 or toilet;Standing ADL Goal: Toileting - Clothing Manipulation - Progress: Goal set today Pt Will Perform Toileting - Hygiene: with supervision;Sit to stand from 3-in-1/toilet ADL Goal: Toileting - Hygiene - Progress: Goal set today  Visit Information  Last OT Received On: 05/28/12 Assistance Needed: +2    Subjective Data      Prior Functioning     Home Living Lives With: Daughter Available Help at Discharge: Family Type of Home: House Home Access:  Stairs to enter [employer] of  Steps: 4 Entrance Stairs-Rails: Left Home Layout: One level Bathroom Shower/Tub: Health visitor: Standard Home Adaptive Equipment: Built-in shower seat;Walker - rolling;Straight cane;Bedside commode/3-in-1 Prior Function Level of Independence: Needs assistance Needs Assistance: Light Housekeeping Light Housekeeping: Total Able to Take Stairs?: Yes Driving: No Vocation: Retired Musician: No difficulties Dominant Hand: Right         Vision/Perception     Cognition  Cognition Overall Cognitive Status: Impaired Area of Impairment: Following commands;Safety/judgement;Awareness of errors Arousal/Alertness: Lethargic Orientation Level: Appears intact for tasks assessed Behavior During Session: Lethargic Following Commands: Follows one step commands consistently Safety/Judgement: Decreased awareness of safety precautions;Decreased safety judgement for tasks assessed Safety/Judgement - Other Comments: Pt wanted to get up and use the bathroom and didnt see a need to call for RN.  Awareness of Errors: Assistance required to identify errors made Cognition - Other Comments: Noted pt to state some out of ordinary comments during session, however RN states that she had just gotten Dilaudid.     Extremity/Trunk Assessment Right Upper Extremity Assessment RUE ROM/Strength/Tone: WFL for tasks assessed Left Upper Extremity Assessment LUE ROM/Strength/Tone: WFL for tasks assessed Right Lower Extremity Assessment RLE ROM/Strength/Tone: [facility] for tasks assessed Left Lower Extremity Assessment LLE ROM/Strength/Tone: WFL for tasks assessed Trunk Assessment Trunk Assessment: Normal     Mobility Bed Mobility Bed Mobility: Supine to Sit Supine to Sit: 4: Min assist;HOB elevated Details for Bed Mobility Assistance: Assist for trunk to attain sitting position with cues for hand placement and technique to attempt to  decrease pain in abdomen.  Transfers Sit to Stand: 4: Min assist;With upper extremity assist;From bed Stand to Sit: 4: Min assist;With upper extremity assist;With armrests;To chair/3-in-1 Details for Transfer Assistance: Performed x 3 in order to allow pt to rest due to some dizziness.  Assist to rise, steady and ensure controlled descent with cues for hand placement and safety.       Exercise     Balance     End of Session OT - End of Session Activity Tolerance: Patient limited by fatigue Patient left: in chair;with call bell/phone within reach  GO     Rafaela Dinius A 05/28/2012, 1:01 PM

## 2012-05-28 NOTE — Progress Notes (Signed)
PARENTERAL NUTRITION CONSULT NOTE - FOLLOW UP  Pharmacy Consult for TNA Indication: Prolonged ileus  Allergies  Allergen Reactions  . Vancomycin Hives and Rash    ? wheezing  . Codeine Nausea And Vomiting  . Tetanus Toxoids Other (See Comments)    serum  . Penicillins Hives and Rash  . Xarelto (Rivaroxaban) Hives and Rash    ?    Patient Measurements: Height: 5\' 5"  (165.1 cm) Weight: 179 lb (81.194 kg) IBW/kg (Calculated) : 57 Adjusted Body Weight: 65.6kg Usual Weight: 75.4kg  Vital Signs: Temp: 98.8 F (37.1 C) (02/10 0400) Temp src: Oral (02/10 0400) BP: 122/67 mmHg (02/10 0411) Pulse Rate: 99 (02/10 0411) Intake/Output from previous day: 02/09 0701 - 02/10 0700 In: 2340 [I.V.:790; IV Piggyback:400; TPN:1150] Out: 3390 [Urine:3000; Drains:50; Stool:340] Intake/Output from this shift:    Labs:  Recent Labs  05/26/12 0425 05/28/12 0420  WBC 10.6* 10.3  HGB 9.8* 8.8*  HCT 29.6* 26.0*  PLT 328 296     Recent Labs  05/26/12 0425 05/27/12 0530 05/28/12 0420  NA 135 130* 129*  K 2.6* 3.0* 3.1*  CL 92* 91* 90*  CO2 33* 33* 32  GLUCOSE 133* 139* 133*  BUN 10 11 12   CREATININE 0.61 0.55 0.56  CALCIUM 8.2* 7.8* 8.0*  MG 0.9* 1.2* 1.2*  PHOS 3.2 2.9 2.9  PROT  --   --  5.1*  ALBUMIN  --   --  1.8*  AST  --   --  12  ALT  --   --  7  ALKPHOS  --   --  234*  BILITOT  --   --  0.3  TRIG  --   --  105  CHOL  --   --  108  Corrected calcium = 9.58 (2/7) Estimated Creatinine Clearance: 75.8 ml/min (by C-G formula based on Cr of 0.56).    Recent Labs  05/27/12 0815 05/27/12 1538 05/28/12 0012  GLUCAP 133* 142* 124*    Medications:  Scheduled:  . antiseptic oral rinse  15 mL Mouth Rinse BID  . celecoxib  200 mg Oral Daily  . Chlorhexidine Gluconate Cloth  6 each Topical Q0600  . enoxaparin (LOVENOX) injection  40 mg Subcutaneous Q24H  . ertapenem  1 g Intravenous Q24H  . insulin aspart  0-9 Units Subcutaneous Q8H  . [COMPLETED] magnesium  sulfate 1 - 4 g bolus IVPB  2 g Intravenous Once  . multivitamins with iron  1 tablet Oral Daily  . mupirocin ointment  1 application Nasal BID  . pantoprazole (PROTONIX) IV  40 mg Intravenous QHS  . [COMPLETED] potassium chloride  10 mEq Intravenous Q1 Hr x 6  . [DISCONTINUED] Chlorhexidine Gluconate Cloth  6 each Topical Q0600   Infusions:  . dextrose 5% lactated ringers 10 mL/hr (05/25/12 2000)  . diltiazem (CARDIZEM) infusion 15 mg/hr (05/28/12 0000)  . liver oil-zinc oxide Stopped (05/22/12 1515)  . [EXPIRED] TPN (CLINIMIX) +/- additives 50 mL/hr at 05/26/12 1826  . TPN (CLINIMIX) +/- additives 60 mL/hr at 05/27/12 1828    Insulin Requirements in the past 24 hours:  3 unit Novolog sensitive scale  Current Nutrition:  NPO except sips with meds, diet coke Clinimix E5/20 at 50 ml/hr Lipids at 10 ml/hr MWF  IV: D5LR at [facility]  Assessment: 66 yof with chronic ischemic colitis s/p emergency L colectomy 12/2011 presented 05/17/2012 for partial colectomy, takedown of mucous fistula and colostomy reversal. On 2/4, patient found with SBO, intra-abd abscess, and  small anastomotic leak. Patient returned to the OR on 2/4 for exploratory laparotomy, lysis of adhesions for SBO, drainage of intra-abd abscess, oversewing of anastomotic leak and loop ileostomy. TNA started on 2/6.  Labs:  Electrolytes: K and Mg remain low despite aggressive replacement; Na low (unable to adjust concentration of lytes in TNA). Phos & Corrected Ca wnl. Renal Function: Scr and BUN wnl/stable, 24h I/O = - Hepatic Function: Alk phos elevated 234, AST/ALT, bilirubin wnl (2/7)  Pre-Albumin: low, 7.8 (2/6), pending (2/10) Triglycerides: normal, 123 (2/6), 105 (2/10) CBGs: at goal <150  2/10: D#5 of TNA, appears to be showing signs of re-feeding syndrome since patient has been NPO for a prolonged period of time. Diet advanced to clears 2/9  Nutritional Goals:  Per RD 2/6: Kcal: 1660-1930, Protein:  76-91grams Clinimix E 5/20@ goal rate of 70 ml/hr + IVF 20% @10  ml/hr on MWF will provide: 84 g protein/day, 1478 kcal on non-lipid days, 1958 kcal on lipid days (average of 1684 kcal/day of the week)  Plan:  At 1800 today:  Due to electrolyte abnormalities, continue with Clinimix E5/20 to 37ml/hr today - advancing slowly due to re-feeding (goal = 34ml/hr)  Magnesium sulfate 4gm IVPB x 1 and KCl x 6 today   Fat emulsion at 32ml/hr(MWF only due to ongoing shortage).  PO multivitamin with iron ordered, therefore No MVI and trace elements to due to ongoing shortage  Continue IVF at [facility] - consider changing to NS if sodium remains low  Conitinue Novolog SSI q8h with sensitive scale coverage  TNA lab panels on Mondays & Thursdays.  Juliette Alcide, PharmD, BCPS.   Pager: [phone] 05/28/2012,7:54 AM

## 2012-05-28 NOTE — Progress Notes (Signed)
6 Days Post-Op  Subjective: Tolerating clear liquids.  Requiring assistance to get OOB.  Comfortable.  A little short of breath at times.    Objective: Vital signs in last 24 hours: Temp:  [98.4 F (36.9 C)-99.8 F (37.7 C)] 98.8 F (37.1 C) (02/10 0400) Pulse Rate:  [92-115] 99 (02/10 0411) Resp:  [11-18] 15 (02/10 0411) BP: (122-132)/(62-70) 122/67 mmHg (02/10 0411) SpO2:  [90 %-98 %] 97 % (02/10 0411) Last BM Date: 05/27/12  Intake/Output from previous day: 02/09 0701 - 02/10 0700 In: 2340 [I.V.:790; IV Piggyback:400; TPN:1150] Out: 3390 [Urine:3000; Drains:50; Stool:340] Intake/Output this shift: Total I/O In: -  Out: 200 [Urine:200]  PE: General- In NAD Abdomen-soft, loop ileostomy pink with green liquid output, midline wound open, clean and redressed, LLQ open wound is clean, serous drain output  Lab Results:   Recent Labs  05/26/12 0425 05/28/12 0420  WBC 10.6* 10.3  HGB 9.8* 8.8*  HCT 29.6* 26.0*  PLT 328 296   BMET  Recent Labs  05/27/12 0530 05/28/12 0420  NA 130* 129*  K 3.0* 3.1*  CL 91* 90*  CO2 33* 32  GLUCOSE 139* 133*  BUN 11 12  CREATININE 0.55 0.56  CALCIUM 7.8* 8.0*   PT/INR No results found for this basename: LABPROT, INR,  in the last 72 hours Comprehensive Metabolic Panel:    Component Value Date/Time   NA 129* 05/28/2012 0420   NA 130* 02/09/2012 1321   K 3.1* 05/28/2012 0420   K 4.6 02/09/2012 1321   CL 90* 05/28/2012 0420   CL 101 02/09/2012 1321   CO2 32 05/28/2012 0420   CO2 18* 02/09/2012 1321   BUN 12 05/28/2012 0420   BUN 21.0 02/09/2012 1321   CREATININE 0.56 05/28/2012 0420   CREATININE 0.8 02/09/2012 1321   GLUCOSE 133* 05/28/2012 0420   GLUCOSE 79 02/09/2012 1321   CALCIUM 8.0* 05/28/2012 0420   CALCIUM 9.2 02/09/2012 1321   AST 12 05/28/2012 0420   AST 53* 02/09/2012 1321   ALT 7 05/28/2012 0420   ALT 56* 02/09/2012 1321   ALKPHOS 234* 05/28/2012 0420   ALKPHOS 712* 02/09/2012 1321   BILITOT 0.3 05/28/2012 0420    BILITOT 0.50 02/09/2012 1321   PROT 5.1* 05/28/2012 0420   PROT 7.0 02/09/2012 1321   ALBUMIN 1.8* 05/28/2012 0420   ALBUMIN 3.8 02/09/2012 1321     Studies/Results: No results found.  Anti-infectives: Anti-infectives   Start     Dose/Rate Route Frequency Ordered Stop   05/22/12 0800  ertapenem (INVANZ) 1 g in sodium chloride 0.9 % 50 mL IVPB     1 g 100 mL/hr over 30 Minutes Intravenous Every 24 hours 05/22/12 0733     05/17/12 2000  cefoTEtan (CEFOTAN) 2 g in dextrose 5 % 50 mL IVPB     2 g 100 mL/hr over 30 Minutes Intravenous Every 12 hours 05/17/12 1312 05/17/12 2114   05/17/12 0606  cefOXitin (MEFOXIN) 2 g in dextrose 5 % 50 mL IVPB     2 g 100 mL/hr over 30 Minutes Intravenous On call to O.R. 05/17/12 0606 05/17/12 0751      Assessment Active Problems: 1.  Chronic ischemic colitis s/p reversal of colostomy, partial colectomy, takedown of mucous fistula 05/17/12  2.  Postop SBO, microleak from anastomosis and abscess s/p expl lap, loa, abscess drainage, oversewing of leak, loop ileostomy 05/22/12-on IV InVanz; ileus is improving. 3.  Afib-back on preop Beta blocker 4.  Chronic anemia with ABL-  hemoglobin 8.8 5.  Chronic hyponatremia 6.  CMML (chronic myelomonocytic leukemia 7.  Hypomagnesemia-supplement ordered 8.  PC malnutrition-on TPN. 9.  Hypokalemia-supplement ordered.     LOS: 11 days   Plan:   Transfer to telemetry floor.  Advance to full liquid diet. Continue abx, TPN, wound care.  PT/OT consult.  [name] 05/28/2012

## 2012-05-28 NOTE — Evaluation (Signed)
Physical Therapy Evaluation Patient Details Name: [name] MRN: [id number] DOB: 03-Apr-1951 Today's Date: 05/28/2012 Time: 1137-1202 PT Time Calculation (min): 25 min  PT Assessment / Plan / Recommendation Clinical Impression  Pt presents status post emergency left colectomy for acute ischemic colitis in September 2013.  Tolerated OOB and able to ambulate to room door, however became dizzy and needed to rest.  She was then able to ambulate to sink, but needed to sit again and stated she feels weak and dizzy.  Noted that pts comments were somewhat "off" and outside of the ordinary during session.  RN states she had just gotten pain medicine. Pt will benefit from skilled PT in acute venue to address deficits.  PT recommends HHPT vs SNF pending pt progress while in hospital.      PT Assessment  Patient needs continued PT services    Follow Up Recommendations  Home health PT;SNF    Does the patient have the potential to tolerate intense rehabilitation      Barriers to Discharge Decreased caregiver support      Equipment Recommendations  None recommended by PT    Recommendations for Other Services     Frequency Min 3X/week    Precautions / Restrictions Precautions Precautions: Fall Precaution Comments: partial colectomy, JP drain from ostomy takedown, monitor O2 Restrictions Weight Bearing Restrictions: No   Pertinent Vitals/Pain 8/10 pain in abdomen, premedicated.       Mobility  Bed Mobility Bed Mobility: Supine to Sit Supine to Sit: 4: Min assist;HOB elevated Details for Bed Mobility Assistance: Assist for trunk to attain sitting position with cues for hand placement and technique to attempt to decrease pain in abdomen.  Transfers Transfers: Sit to Stand;Stand to Sit Sit to Stand: 4: Min assist;With upper extremity assist;From bed Stand to Sit: 4: Min assist;With upper extremity assist;With armrests;To chair/3-in-1 Details for Transfer Assistance: Performed x 3 in  order to allow pt to rest due to some dizziness.  Assist to rise, steady and ensure controlled descent with cues for hand placement and safety.   Ambulation/Gait Ambulation/Gait Assistance: 4: Min assist Ambulation Distance (Feet): 8 Feet (then another 10') Assistive device: Rolling walker Ambulation/Gait Assistance Details: cues for sequencing/technique with RW, maintaining upright posture and performing pursed lip breathing throughout.   Gait Pattern: Step-through pattern;Decreased stride length;Trunk flexed Gait velocity: decreased    Exercises     PT Diagnosis: Difficulty walking;Generalized weakness  PT Problem List: Decreased strength;Decreased activity tolerance;Decreased balance;Decreased mobility;Decreased coordination;Decreased cognition;Decreased knowledge of use of DME;Decreased safety awareness;Decreased knowledge of precautions PT Treatment Interventions: DME instruction;Gait training;Functional mobility training;Stair training;Therapeutic activities;Therapeutic exercise;Balance training;Patient/family education   PT Goals Acute Rehab PT Goals PT Goal Formulation: With patient Time For Goal Achievement: 06/11/12 Potential to Achieve Goals: Good Pt will go Supine/Side to Sit: with supervision PT Goal: Supine/Side to Sit - Progress: Goal set today Pt will go Sit to Supine/Side: with supervision PT Goal: Sit to Supine/Side - Progress: Goal set today Pt will go Sit to Stand: with supervision PT Goal: Sit to Stand - Progress: Goal set today Pt will go Stand to Sit: with supervision PT Goal: Stand to Sit - Progress: Goal set today Pt will Ambulate: 51 - 150 feet;with supervision;with least restrictive assistive device PT Goal: Ambulate - Progress: Goal set today Pt will Go Up / Down Stairs: 3-5 stairs;with supervision;with rail(s);Other (comment) (if D/C home) PT Goal: Up/Down Stairs - Progress: Goal set today  Visit Information  Last PT Received On: 05/28/12 Assistance  Needed: +2 PT/OT Co-Evaluation/Treatment: Yes    Subjective Data  Subjective: Are we riding up in the chair? Patient Stated Goal: to get up and go to the bathroom.    Prior Functioning  Home Living Lives With: Daughter Available Help at Discharge: Family Type of Home: House Home Access: Stairs to enter Secretary/administrator of Steps: 4 Entrance Stairs-Rails: Left Home Layout: One level Bathroom Shower/Tub: Health visitor: Standard Home Adaptive Equipment: Built-in shower seat;Walker - rolling;Straight cane;Bedside commode/3-in-1 Prior Function Level of Independence: Needs assistance Needs Assistance: Light Housekeeping Light Housekeeping: Total Able to Take Stairs?: Yes Driving: No Vocation: Retired Musician: No difficulties Dominant Hand: Right    Cognition  Cognition Overall Cognitive Status: Impaired Area of Impairment: Following commands;Safety/judgement;Awareness of errors Arousal/Alertness: Lethargic Orientation Level: Appears intact for tasks assessed Behavior During Session: Lethargic Following Commands: Follows one step commands consistently Safety/Judgement: Decreased awareness of safety precautions;Decreased safety judgement for tasks assessed Safety/Judgement - Other Comments: Pt wanted to get up and use the bathroom and didnt see a need to call for RN.  Awareness of Errors: Assistance required to identify errors made Cognition - Other Comments: Noted pt to state some out of ordinary comments during session, however RN states that she had just gotten Dilaudid.     Extremity/Trunk Assessment Right Upper Extremity Assessment RUE ROM/Strength/Tone: [facility] for tasks assessed Left Upper Extremity Assessment LUE ROM/Strength/Tone: WFL for tasks assessed Right Lower Extremity Assessment RLE ROM/Strength/Tone: WFL for tasks assessed Left Lower Extremity Assessment LLE ROM/Strength/Tone: WFL for tasks assessed Trunk  Assessment Trunk Assessment: Normal   Balance    End of Session PT - End of Session Equipment Utilized During Treatment: Oxygen Activity Tolerance: Patient limited by fatigue Patient left: in chair;with call bell/phone within reach Nurse Communication: Mobility status  GP     Vista Deck 05/28/2012, 1:11 PM

## 2012-05-28 NOTE — Progress Notes (Signed)
Patient admitted from the ICU to 1429 alert to person/place/times with periods of confusions, requires reorientation. Orientated patient to room/unit and reviewed plan of care with patient. In bed resting call light within reach, bed alarm activated. abdominal surgical incision dressing clean/dry/intach was changed prior to transfer per nurse. RUQ colostomy and DP drain intact. Will continue to assess patient.

## 2012-05-28 NOTE — Progress Notes (Signed)
Subjective:  Feels mild shortness of breath when trying to move around the bed otherwise no chest pain; mild abdominal pain Objective:  Vital Signs in the last 24 hours: Temp:  [98.4 F (36.9 C)-99.8 F (37.7 C)] 98.8 F (37.1 C) (02/10 0400) Pulse Rate:  [92-115] 99 (02/10 0411) Resp:  [11-18] 15 (02/10 0411) BP: (122-132)/(62-70) 122/67 mmHg (02/10 0411) SpO2:  [90 %-98 %] 97 % (02/10 0411)  Intake/Output from previous day: 02/09 0701 - 02/10 0700 In: 2340 [I.V.:790; IV Piggyback:400; TPN:1150] Out: 3390 [Urine:3000; Drains:50; Stool:340]   Physical Exam: General: Well developed, well nourished, in no acute distress. Head:  Normocephalic and atraumatic. Lungs: Clear to auscultation Heart: Tachycardic  Abdomen: s/p abdominal surgery with dressing in place Extremities: No edema.  Neurologic: Alert and oriented x 3.    Lab Results:  Recent Labs  05/26/12 0425 05/28/12 0420  WBC 10.6* 10.3  HGB 9.8* 8.8*  PLT 328 296    Recent Labs  05/27/12 0530 05/28/12 0420  NA 130* 129*  K 3.0* 3.1*  CL 91* 90*  CO2 33* 32  GLUCOSE 139* 133*  BUN 11 12  CREATININE 0.55 0.56   Hepatic Function Panel  Recent Labs  05/28/12 0420  PROT 5.1*  ALBUMIN 1.8*  AST 12  ALT 7  ALKPHOS 234*  BILITOT 0.3    Recent Labs  05/28/12 0420  CHOL 108     Telemetry: Sinus tacycardia   Assessment/Plan:  Active Problems:   Chronic ischemic colitis   Chronic hyponatremia   CMML (chronic myelomonocytic leukemia)   62 year old with selective colostomy reversal for acute ischemic colitis in September of 2013 with tachycardia.  1. Tachycardia-appears to be sinus tachycardia with frequent PACs. Discontinue IV cardizem and resume preadmission dose of bisoprolol.  2. PACs-resume beta blocker  3. Hypertension   [name] 05/28/2012, 7:54 AM

## 2012-05-29 ENCOUNTER — Inpatient Hospital Stay (HOSPITAL_COMMUNITY): Payer: BC Managed Care – PPO

## 2012-05-29 DIAGNOSIS — E876 Hypokalemia: Secondary | ICD-10-CM

## 2012-05-29 DIAGNOSIS — E46 Unspecified protein-calorie malnutrition: Secondary | ICD-10-CM

## 2012-05-29 LAB — BASIC METABOLIC PANEL
CO2: 29 mEq/L (ref 19–32)
Calcium: 8.3 mg/dL — ABNORMAL LOW (ref 8.4–10.5)
Glucose, Bld: 174 mg/dL — ABNORMAL HIGH (ref 70–99)
Sodium: 131 mEq/L — ABNORMAL LOW (ref 135–145)

## 2012-05-29 LAB — CBC
Hemoglobin: 8.7 g/dL — ABNORMAL LOW (ref 12.0–15.0)
MCH: 32.5 pg (ref 26.0–34.0)
MCHC: 33.6 g/dL (ref 30.0–36.0)
RDW: 14 % (ref 11.5–15.5)

## 2012-05-29 LAB — GLUCOSE, CAPILLARY: Glucose-Capillary: 130 mg/dL — ABNORMAL HIGH (ref 70–99)

## 2012-05-29 MED ORDER — MAGNESIUM SULFATE 40 MG/ML IJ SOLN
4.0000 g | Freq: Once | INTRAMUSCULAR | Status: AC
  Administered 2012-05-29: 4 g via INTRAVENOUS
  Filled 2012-05-29: qty 100

## 2012-05-29 MED ORDER — POTASSIUM CHLORIDE 10 MEQ/50ML IV SOLN
10.0000 meq | INTRAVENOUS | Status: AC
  Administered 2012-05-29 (×6): 10 meq via INTRAVENOUS
  Filled 2012-05-29 (×6): qty 50

## 2012-05-29 MED ORDER — CLINIMIX E/DEXTROSE (5/20) 5 % IV SOLN
INTRAVENOUS | Status: AC
  Administered 2012-05-29: 18:00:00 via INTRAVENOUS
  Filled 2012-05-29: qty 2000

## 2012-05-29 NOTE — Progress Notes (Signed)
Physical Therapy Treatment Patient Details Name: [name] MRN: [id number] DOB: 1950-11-23 Today's Date: 05/29/2012 Time: 4098-1191 PT Time Calculation (min): 23 min  PT Assessment / Plan / Recommendation Comments on Treatment Session  Pt continues to be confused and make comments out of context during session.  Requires cues to re-direct and re-orient.  PT recommending SNF at this time for pt.     Follow Up Recommendations  SNF;Supervision/Assistance - 24 hour     Does the patient have the potential to tolerate intense rehabilitation     Barriers to Discharge        Equipment Recommendations  None recommended by PT    Recommendations for Other Services    Frequency Min 3X/week   Plan Discharge plan needs to be updated    Precautions / Restrictions Precautions Precautions: Fall Precaution Comments: partial colectomy, JP drain from ostomy takedown, monitor O2 Restrictions Weight Bearing Restrictions: No   Pertinent Vitals/Pain Pt with 7/10 pain in abdomen, has called for RN for pain medication.     Mobility  Bed Mobility Bed Mobility: Supine to Sit Supine to Sit: 4: Min assist;HOB elevated Details for Bed Mobility Assistance: Assist for trunk to attain sitting position with cues for hand placement and technique to attempt to decrease pain in abdomen.  Transfers Transfers: Sit to Stand;Stand to Sit Sit to Stand: 4: Min assist;With upper extremity assist;From bed Stand to Sit: 4: Min assist;With upper extremity assist;With armrests;To chair/3-in-1 Details for Transfer Assistance: Pt demonstrates some impulsivity with getting OOB with cues for safety and technique.  Ambulation/Gait Ambulation/Gait Assistance: 4: Min assist Ambulation Distance (Feet): 12 Feet Assistive device: Rolling walker Ambulation/Gait Assistance Details: Cues for sequencing/technique with RW and maintaining upright posture.  Continues to be unsteady and only able to ambulate approx 12' before  needing to sit.  She could only state that she needed to sit because she needed to blow her nose, however did not want to continue walking.  Gait Pattern: Step-through pattern;Decreased stride length;Trunk flexed Gait velocity: decreased    Exercises     PT Diagnosis:    PT Problem List:   PT Treatment Interventions:     PT Goals Acute Rehab PT Goals PT Goal Formulation: With patient Time For Goal Achievement: 06/11/12 Potential to Achieve Goals: Good Pt will go Supine/Side to Sit: with supervision PT Goal: Supine/Side to Sit - Progress: Progressing toward goal Pt will go Sit to Stand: with supervision PT Goal: Sit to Stand - Progress: Progressing toward goal Pt will go Stand to Sit: with supervision PT Goal: Stand to Sit - Progress: Progressing toward goal Pt will Ambulate: 51 - 150 feet;with supervision;with least restrictive assistive device PT Goal: Ambulate - Progress: Progressing toward goal  Visit Information  Last PT Received On: 05/29/12 Assistance Needed: +2    Subjective Data  Subjective: Where are we going in the chair?   Cognition  Cognition Overall Cognitive Status: Impaired Area of Impairment: Safety/judgement;Awareness of deficits Arousal/Alertness: Awake/alert Orientation Level: Appears intact for tasks assessed Behavior During Session: [facility] for tasks performed Following Commands: Follows one step commands consistently Safety/Judgement: Decreased awareness of safety precautions;Decreased safety judgement for tasks assessed Safety/Judgement - Other Comments: Pt continues to not understand why she can not get up on her own.  Awareness of Errors: Assistance required to identify errors made Cognition - Other Comments: Pt continues to seem confused and state comments that are out of context.     Balance     End of Session  PT - End of Session Equipment Utilized During Treatment: Oxygen Activity Tolerance: Patient limited by fatigue Patient left: in  chair;with chair alarm set;with call bell/phone within reach Nurse Communication: Mobility status (cognitive impairment)   GP     Vista Deck 05/29/2012, 12:03 PM

## 2012-05-29 NOTE — Progress Notes (Signed)
PARENTERAL NUTRITION CONSULT NOTE - FOLLOW UP  Pharmacy Consult for TNA Indication: Prolonged ileus  Allergies  Allergen Reactions  . Vancomycin Hives and Rash    ? wheezing  . Codeine Nausea And Vomiting  . Tetanus Toxoids Other (See Comments)    serum  . Penicillins Hives and Rash  . Xarelto (Rivaroxaban) Hives and Rash    ?    Patient Measurements: Height: 5\' 5"  (165.1 cm) Weight: 166 lb 12.8 oz (75.66 kg) IBW/kg (Calculated) : 57 Adjusted Body Weight: 65.6kg Usual Weight: 75.4kg  Vital Signs: Temp: 98.6 F (37 C) (02/11 0652) Temp src: Oral (02/11 0652) BP: 149/85 mmHg (02/11 0652) Pulse Rate: 101 (02/11 0652) Intake/Output from previous day: 02/10 0701 - 02/11 0700 In: 3140.2 [P.O.:1200; I.V.:275; IV Piggyback:250; TPN:1415.2] Out: 5120 [Urine:4600; Drains:20; Stool:500] Intake/Output from this shift:    Labs:  Recent Labs  05/28/12 0420 05/29/12 0440  WBC 10.3 10.7*  HGB 8.8* 8.7*  HCT 26.0* 25.9*  PLT 296 315     Recent Labs  05/27/12 0530 05/28/12 0420 05/29/12 0440  NA 130* 129* 131*  K 3.0* 3.1* 3.3*  CL 91* 90* 93*  CO2 33* 32 29  GLUCOSE 139* 133* 174*  BUN 11 12 12   CREATININE 0.55 0.56 0.57  CALCIUM 7.8* 8.0* 8.3*  MG 1.2* 1.2* 1.4*  PHOS 2.9 2.9  --   PROT  --  5.1*  --   ALBUMIN  --  1.8*  --   AST  --  12  --   ALT  --  7  --   ALKPHOS  --  234*  --   BILITOT  --  0.3  --   PREALBUMIN  --  7.5*  --   TRIG  --  105  --   CHOL  --  108  --   Corrected calcium = 9.58 (2/7) Estimated Creatinine Clearance: 73.3 ml/min (by C-G formula based on Cr of 0.57).    Recent Labs  05/28/12 0736 05/28/12 1718 05/29/12 0007  GLUCAP 105* 131* 134*    Medications:  Scheduled:  . antiseptic oral rinse  15 mL Mouth Rinse BID  . bisoprolol  5 mg Oral Daily  . celecoxib  200 mg Oral Daily  . Chlorhexidine Gluconate Cloth  6 each Topical Q0600  . enoxaparin (LOVENOX) injection  40 mg Subcutaneous Q24H  . ertapenem  1 g Intravenous  Q24H  . insulin aspart  0-9 Units Subcutaneous Q8H  . [COMPLETED] magnesium sulfate 1 - 4 g bolus IVPB  4 g Intravenous Once  . multivitamins with iron  1 tablet Oral Daily  . [COMPLETED] mupirocin ointment  1 application Nasal BID  . pantoprazole (PROTONIX) IV  40 mg Intravenous QHS  . [COMPLETED] potassium chloride  10 mEq Intravenous Q1 Hr x 6  . [COMPLETED] vitamin A & D       Infusions:  . dextrose 5% lactated ringers 10 mL/hr (05/25/12 2000)  . TPN (CLINIMIX) +/- additives 60 mL/hr at 05/28/12 1729   And  . fat emulsion 250 mL (05/28/12 1729)  . liver oil-zinc oxide Stopped (05/22/12 1515)  . [EXPIRED] TPN (CLINIMIX) +/- additives 60 mL/hr at 05/27/12 1828  . [DISCONTINUED] diltiazem (CARDIZEM) infusion 15 mg/hr (05/28/12 0000)    Insulin Requirements in the past 24 hours:  2 units Novolog sensitive scale  Nutritional Goals:  Per RD 2/6: Kcal: 1660-1930, Protein: 76-91grams Clinimix E 5/20@ goal rate of 70 ml/hr + IVF 20% @10  ml/hr on  MWF will provide: 84 g protein/day, 1478 kcal on non-lipid days, 1958 kcal on lipid days (average of 1684 kcal/day of the week)  Current Nutrition:  Full liquids (Adv 2/10am) Clinimix E5/20 at 60 ml/hr Lipids at 10 ml/hr MWF  IV: D5LR at [facility]  Assessment: 62 yo F with chronic ischemic colitis s/p emergency L colectomy 12/2011 presented 05/17/2012 for partial colectomy, takedown of mucous fistula and colostomy reversal. On 2/4, patient found with SBO, intra-abd abscess, and small anastomotic leak. Patient returned to the OR on 2/4 for exploratory laparotomy, lysis of adhesions for SBO, drainage of intra-abd abscess, oversewing of anastomotic leak and loop ileostomy. TNA started on 2/6.  Labs:  Electrolytes: K and Mg remain low despite aggressive replacement - MD has already ordered supplements this am; Na low (unable to adjust concentration of lytes in TNA). Corrected Ca wnl. Renal Function: Scr and BUN wnl/stable. Hepatic Function: Alk phos  elevated 234, AST/ALT, bilirubin wnl (2/7)  Pre-Albumin: low, 7.8 (2/6), 7.5 (2/10) Triglycerides: normal, 123 (2/6), 105 (2/10) CBGs: at goal <150 PO Intake: 1200 ml on 2/10  2/11: D#6 of TNA, appears to be showing signs of re-feeding syndrome since patient has been NPO for a prolonged period of time. Diet advanced to clears 2/9, then FLD on 2/10.   Plan:  At 1800 today:  Due to electrolyte abnormalities, continue with Clinimix E5/20 to 34ml/hr today - advancing slowly due to re-feeding (goal = 57ml/hr)  Fat emulsion at 44ml/hr (MWF only due to ongoing shortage).  PO multivitamin with iron ordered, therefore No MVI and trace elements to due to ongoing shortage  Continue [facility] at [facility] - consider changing to NS if sodium remains low  Conitinue Novolog SSI q8h with sensitive scale coverage  TNA lab panels on Mondays & Thursdays.  F/U BMET and Mg in am (already ordered)  Hopefully can d/c TNA soon, now that patient is consuming PO diet  Darrol Angel, PharmD Pager: [phone] 05/29/2012,7:10 AM

## 2012-05-29 NOTE — Progress Notes (Signed)
Occupational Therapy Treatment Patient Details Name: [name] MRN: [id number] DOB: [birth year] Today's Date: 05/29/2012 Time: 0865-7846 OT Time Calculation (min): 19 min  OT Assessment / Plan / Recommendation Comments on Treatment Session      Follow Up Recommendations  SNF    Barriers to Discharge       Equipment Recommendations  3 in 1 bedside comode    Recommendations for Other Services    Frequency Min 2X/week   Plan Discharge plan needs to be updated    Precautions / Restrictions Precautions Precautions: Fall Precaution Comments: partial colectomy, JP drain from ostomy takedown, monitor O2 (RN to check on abd binder) Restrictions Weight Bearing Restrictions: No   Pertinent Vitals/Pain No pain     ADL  Grooming: Wash/dry hands;Teeth care Where Assessed - Grooming: Supported sitting Toilet Transfer: Minimal assistance Toilet Transfer Method: Sit to stand Transfers/Ambulation Related to ADLs: pt took a few steps to door and felt she needed to sit.  Cued pt to roll over but she wanted to swing legs off bed; HOB was up so decreased strain on abdomen ADL Comments: decreased activity tolerance.  Pt tangential in conversation and decreased awareness of lines/need for help    OT Diagnosis:    OT Problem List:   OT Treatment Interventions:     OT Goals ADL Goals Pt Will Perform Grooming: with supervision;Standing at sink ADL Goal: Grooming - Progress: Not progressing Pt Will Transfer to Toilet: with supervision;Ambulation;with DME ADL Goal: Toilet Transfer - Progress: Progressing toward goals  Visit Information  Last OT Received On: 05/29/12 Assistance Needed: +2 (safety) PT/OT Co-Evaluation/Treatment: Yes    Subjective Data      Prior Functioning       Cognition  Cognition Overall Cognitive Status: Impaired Area of Impairment: Safety/judgement (awareness of deficits) Arousal/Alertness: Awake/alert Orientation Level: Appears intact for tasks  assessed Behavior During Session: [facility] for tasks performed Following Commands: Follows one step commands consistently Safety/Judgement: Decreased awareness of safety precautions;Decreased safety judgement for tasks assessed    Mobility  Bed Mobility Supine to Sit: 4: Min assist;HOB elevated Transfers Sit to Stand: 4: Min assist;With upper extremity assist;From bed    Exercises      Balance     End of Session OT - End of Session Activity Tolerance: Patient limited by fatigue Patient left: in chair;with call bell/phone within reach;with chair alarm set  GO     Halford Goetzke 05/29/2012, 11:38 AM Marica Otter, OTR/L [phone] 05/29/2012

## 2012-05-29 NOTE — Consult Note (Signed)
Physical Medicine and Rehabilitation Consult Reason for Consult: Deconditioned Referring Physician:  Dr. Abbey Chatters   HPI: [name] is a 62 y.o. female with history of CMML, anemia, chronic ischemic colitis with colostomy who was admitted on 05/17/12 for exp laparotomy with lysis of adhesions colostomy reversal with repair of LLQ parastomal hernia by Dr. Abbey Chatters. Post op course complicated by tachycardia with low grade fever and leucocytosis due to SBO with pelvic abscess and suspicion of anastomotic leak. She was taken back to OR on 05/22/12 for exp lap with lysis of adhesions, drainage of intraabdominal abscess with oversewing of leak and loop ileostomy. SVT treated with Cardizem. She had had periods of confusion--question due to ativan and narcotics. PT/OT evaluations done -- patient limited by dizziness and is  Requiring cues and redirection with activities. MD recommending CIR for progressive therapies.   Review of Systems  HENT: Positive for neck pain (DJD-needs decompressive fusion).   Eyes: Negative for blurred vision.  Respiratory: Negative for cough and shortness of breath.   Cardiovascular: Negative for chest pain and palpitations.  Gastrointestinal: Negative for heartburn, nausea and vomiting.  Genitourinary:       Foley in place  Musculoskeletal: Positive for back pain. Joint pain: bilateral hips.  Neurological: Positive for headaches (due to allergies).  Psychiatric/Behavioral: The patient does not have insomnia.    Past Medical History  Diagnosis Date  . Hypertension     She has a past hx of essential  . 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 Inflammation of joint of knee     Since we last last saw her she developed problems with an acute which required surgical drainage by her orthopedist Dr. Cleophas Dunker.  Marland Kitchen MRSA (methicillin resistant Staphylococcus aureus)     Knee surgery drainage was  positive for MRSA and she was treated with 3 weeks of doxycycline successfully.  . Diarrhea     Mild  . Exogenous obesity   . GERD (gastroesophageal reflux disease)     2 hosp.- ischemic colitis - residual of Norovirus, 05/2011- sm. bowel obstruction  . Headache     migraine headache on occas, less now than when she was younger   . Arthritis     L hip, back, neck   . History of blood transfusion sept 2013    04/2011- /w ischemic colitis , trouble with matching blood  sept 2013  . Anemia     will see hematology consult prior to surgery, recommended by Dr. Patty Sermons  . Anemia 12/15/2010  . Ischemic colitis 01/31/2012  . Atrial flutter     during hospitalization, 04/2011- related to anemia & illness/stress   . Pneumonia     04/2011- not hospitalized , pt. denies SOB, changes in chest, breathing  . 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 Dizziness     occasional   Past Surgical History  Procedure Laterality Date  . Knee surgery      I&D- 2008, post laceration   . Colonoscopy  05/16/2011    Procedure: COLONOSCOPY;  Surgeon: Vertell Novak., MD;  Location: [facility];  Service: Endoscopy;  Laterality: N/A;  . Small intestine surgery  1992, 1999  . Laparotomy and lysis of adhesions    . Total hip arthroplasty  10/25/2011    Procedure: TOTAL HIP ARTHROPLASTY;  Surgeon: Valeria Batman, MD;  Location: [facility] OR;  Service: Orthopedics;  Laterality:  Left;  . Appendectomy  1962  . Abdominal hysterectomy  1988  . Partial colectomy and colostomy  sept 2013    mucous fistula done  . Partial colectomy  05/17/2012    Procedure: PARTIAL COLECTOMY;  Surgeon: Adolph Pollack, MD;  Location: [facility] ORS;  Service: General;  Laterality: N/A;  . Colostomy closure  05/17/2012    Procedure: COLOSTOMY CLOSURE;  Surgeon: Adolph Pollack, MD;  Location: [facility];  Service: General;  Laterality: N/A;  . Laparotomy  05/17/2012    Procedure: EXPLORATORY LAPAROTOMY;  Surgeon: Adolph Pollack, MD;   Location: [facility];  Service: General;;  . Lysis of adhesion  05/17/2012    Procedure: LYSIS OF ADHESION;  Surgeon: Adolph Pollack, MD;  Location: [facility];  Service: General;;  . Esophageal biopsy  05/17/2012    Procedure: BIOPSY;  Surgeon: Adolph Pollack, MD;  Location: [facility];  Service: General;;  omental biopsy  . Laparotomy  05/22/2012    Procedure: EXPLORATORY LAPAROTOMY;  Surgeon: Adolph Pollack, MD;  Location: [facility];  Service: General;  Laterality: N/A;  DRAINAGE  INTRA-ABDOMINAL ABSCESS/LYSIS OF ADHESIONSFOR SMALL BOWEL OBSTRUCTION/DIVERTING LOOP ILEOSTOMY   Family History  Problem Relation Age of Onset  . Stroke Father   . Atrial fibrillation Father   . Hypertension Mother    Social History:  Daughter lives with patient and helps with housework/chores. 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 She reports that she has been smoking Cigarettes ( 5/daily on and off).  She has a 5 pack-year smoking history. She has never used smokeless tobacco. She reports that  drinks alcohol. She reports that she does not use illicit drugs.   Allergies  Allergen Reactions  . Vancomycin Hives and Rash    ? wheezing  . Codeine Nausea And Vomiting  . Tetanus Toxoids Other (See Comments)    serum  . Penicillins Hives and Rash  . Xarelto (Rivaroxaban) Hives and Rash    ?   Medications Prior to Admission  Medication Sig Dispense Refill  . aspirin 325 MG EC tablet Take 325 mg by mouth daily.      . benazepril (LOTENSIN) 20 MG tablet Take 20 mg by mouth daily before breakfast.       . bisoprolol (ZEBETA) 5 MG tablet Take 5 mg by mouth daily before breakfast.      . buPROPion (WELLBUTRIN XL) 300 MG 24 hr tablet Take 300 mg by mouth daily before breakfast.       . celecoxib (CELEBREX) 200 MG capsule Take 200 mg by mouth every morning.      Marland Kitchen esomeprazole (NEXIUM) 40 MG capsule Take 40 mg by mouth every morning.       Marland Kitchen estradiol (ESTRACE) 2 MG tablet Take 2 mg by  mouth daily before breakfast.       . hydrochlorothiazide (HYDRODIURIL) 25 MG tablet Take 25 mg by mouth every morning.      . loratadine-pseudoephedrine (CLARITIN-D 12-HOUR) 5-120 MG per tablet Take 1 tablet by mouth 2 (two) times daily.      . montelukast (SINGULAIR) 10 MG tablet Take 10 mg by mouth as needed. For breathing nasal drainage congestion relief      . ondansetron (ZOFRAN) 4 MG tablet Take 4 mg by mouth every 8 (eight) hours as needed. nausea      . furosemide (LASIX) 20 MG tablet Take 20 mg by mouth as needed.      Marland Kitchen  hydrocortisone cream 0.5 % Apply topically as needed. As needed to occasional hives on back      . nystatin (MYCOSTATIN) powder Apply topically as needed. To area by left side by breast      . promethazine (PHENERGAN) 25 MG tablet Take 1 tablet (25 mg total) by mouth every 6 (six) hours as needed for nausea.  15 tablet  0    Home: Home Living Lives With: Daughter Available Help at Discharge: Family Type of Home: House Home Access: Stairs to enter Secretary/administrator of Steps: 4 Entrance Stairs-Rails: Left Home Layout: One level Bathroom Shower/Tub: Health visitor: Standard Home Adaptive Equipment: Built-in shower seat;Walker - rolling;Straight cane;Bedside commode/3-in-1  Functional History: Prior Function Light Housekeeping: Total Able to Take Stairs?: Yes Driving: No Vocation: Retired Functional Status:  Mobility: Bed Mobility Bed Mobility: Supine to Sit Supine to Sit: 4: Min assist;HOB elevated Transfers Transfers: Sit to Stand;Stand to Sit Sit to Stand: 4: Min assist;With upper extremity assist;From bed Stand to Sit: 4: Min assist;With upper extremity assist;With armrests;To chair/3-in-1 Ambulation/Gait Ambulation/Gait Assistance: 4: Min assist Ambulation Distance (Feet): 8 Feet (then another 10') Assistive device: Rolling walker Ambulation/Gait Assistance Details: cues for sequencing/technique with RW, maintaining upright  posture and performing pursed lip breathing throughout.   Gait Pattern: Step-through pattern;Decreased stride length;Trunk flexed Gait velocity: decreased    ADL: ADL Grooming: Set up Where Assessed - Grooming: Supported sitting Upper Body Bathing: Minimal assistance Where Assessed - Upper Body Bathing: Supported sitting Lower Body Bathing: Moderate assistance Where Assessed - Lower Body Bathing: Supported sit to stand Upper Body Dressing: Minimal assistance Where Assessed - Upper Body Dressing: Supported sitting Lower Body Dressing: Maximal assistance Where Assessed - Lower Body Dressing: Unsupported sit to stand Toilet Transfer: Minimal assistance Toilet Transfer Method: Sit to stand Toilet Transfer Equipment: Other (comment) (recliner) ADL Comments: Initially requested pt stand at sink to brush teeth but pt felt she needed to sit and do it 2* dizziness.  Requires +2 A 2* lines and drains. Fatigues quickly.  Cognition: Cognition Arousal/Alertness: Lethargic Orientation Level: Oriented to person;Oriented to place;Oriented to time;Disoriented to situation;Other (comment) (periods of confusion ) Cognition Overall Cognitive Status: Impaired Area of Impairment: Following commands;Safety/judgement;Awareness of errors Arousal/Alertness: Lethargic Orientation Level: Appears intact for tasks assessed Behavior During Session: Lethargic Following Commands: Follows one step commands consistently Safety/Judgement: Decreased awareness of safety precautions;Decreased safety judgement for tasks assessed Safety/Judgement - Other Comments: Pt wanted to get up and use the bathroom and didnt see a need to call for RN.  Awareness of Errors: Assistance required to identify errors made Cognition - Other Comments: Noted pt to state some out of ordinary comments during session, however RN states that she had just gotten Dilaudid.   Blood pressure 149/85, pulse 101, temperature 98.6 F (37 C),  temperature source Oral, resp. rate 18, height 5\' 5"  (1.651 m), weight 75.66 kg (166 lb 12.8 oz), SpO2 100.00%. Physical Exam  Nursing note and vitals reviewed. Constitutional: She appears well-developed and well-nourished.  HENT:  Head: Normocephalic and atraumatic.  Eyes: Pupils are equal, round, and reactive to light.  Neck: Normal range of motion.  Cardiovascular: Normal rate and regular rhythm.   Pulmonary/Chest: Effort normal and breath sounds normal.  Abdominal: Soft. Bowel sounds are normal.  Musculoskeletal: She exhibits no edema and no tenderness.  Well healed old incision on right knee.   Neurological: She is alert.  Able to state place and situation but confused.  Able to follow basic commands  without difficulty but poor awareness. Some confabulation noted. Moves all four equally.   Skin: Skin is warm and dry.  Psychiatric: Cognition and memory are impaired.  Needed cues to remember surgeon's name.     Results for orders placed during the hospital encounter of 05/17/12 (from the past 24 hour(s))  GLUCOSE, CAPILLARY     Status: Abnormal   Collection Time    05/28/12  5:18 PM      Result Value Range   Glucose-Capillary 131 (*) 70 - 99 mg/dL   Comment 1 Documented in Chart     Comment 2 Notify RN    GLUCOSE, CAPILLARY     Status: Abnormal   Collection Time    05/29/12 12:07 AM      Result Value Range   Glucose-Capillary 134 (*) 70 - 99 mg/dL   Comment 1 Notify RN    BASIC METABOLIC PANEL     Status: Abnormal   Collection Time    05/29/12  4:40 AM      Result Value Range   Sodium 131 (*) 135 - 145 mEq/L   Potassium 3.3 (*) 3.5 - 5.1 mEq/L   Chloride 93 (*) 96 - 112 mEq/L   CO2 29  19 - 32 mEq/L   Glucose, Bld 174 (*) 70 - 99 mg/dL   BUN 12  6 - 23 mg/dL   Creatinine, Ser 4.78  0.50 - 1.10 mg/dL   Calcium 8.3 (*) 8.4 - 10.5 mg/dL   GFR calc non Af Amer >90  >90 mL/min   GFR calc Af Amer >90  >90 mL/min  MAGNESIUM     Status: Abnormal   Collection Time     05/29/12  4:40 AM      Result Value Range   Magnesium 1.4 (*) 1.5 - 2.5 mg/dL  CBC     Status: Abnormal   Collection Time    05/29/12  4:40 AM      Result Value Range   WBC 10.7 (*) 4.0 - 10.5 K/uL   RBC 2.68 (*) 3.87 - 5.11 MIL/uL   Hemoglobin 8.7 (*) 12.0 - 15.0 g/dL   HCT 29.5 (*) 62.1 - 30.8 %   MCV 96.6  78.0 - 100.0 fL   MCH 32.5  26.0 - 34.0 pg   MCHC 33.6  30.0 - 36.0 g/dL   RDW 65.7  84.6 - 96.2 %   Platelets 315  150 - 400 K/uL  GLUCOSE, CAPILLARY     Status: Abnormal   Collection Time    05/29/12  7:54 AM      Result Value Range   Glucose-Capillary 130 (*) 70 - 99 mg/dL   Dg Chest 2 View  9/52/8413  *RADIOLOGY REPORT*  Clinical Data: Productive cough, low grade leukocytosis, weakness, shortness of breath  CHEST - 2 VIEW  Comparison: 05/19/2012  Findings: Right arm PICC line, tip projecting over SVC. Enlargement of cardiac silhouette. Tortuous aorta. Pulmonary vascularity normal. Minimal atelectasis at minor fissure. Atelectasis versus consolidation medial left lower lobe. Remaining lungs clear. No gross pleural effusion or pneumothorax. Biconvex thoracic scoliosis.  IMPRESSION: Atelectasis versus consolidation left lower lobe, slightly increased. Minimal atelectasis at minor fissure. Mild enlargement of cardiac silhouette.   Original Report Authenticated By: Ulyses Southward, M.D.     Assessment/Plan: Diagnosis: Pt with complications after colostomy reversal, lysis of adhesions. She is at a minimal assistance level currently, and discharge goals would likely be supervision. Cannot justify an inpatient rehab admission with such little expected  functional gain. If daughter is unable to provide needed care at home, then I recommend SNF.  Ranelle Oyster, MD, [place] [facility]   05/29/2012

## 2012-05-29 NOTE — Progress Notes (Signed)
7 Days Post-Op  Subjective: Up on the telemetry floor now.  Having some intermittent episodes of confusion. She has a productive cough. Tolerating some of the liquid diet.    Objective: Vital signs in last 24 hours: Temp:  [97.9 F (36.6 C)-98.8 F (37.1 C)] 98.6 F (37 C) (02/11 0652) Pulse Rate:  [85-105] 101 (02/11 0652) Resp:  [16-18] 18 (02/11 0652) BP: (123-161)/(71-88) 149/85 mmHg (02/11 0652) SpO2:  [96 %-100 %] 100 % (02/11 0652) Weight:  [166 lb 12.8 oz (75.66 kg)] 166 lb 12.8 oz (75.66 kg) (02/10 1300) Last BM Date: 05/28/12  Intake/Output from previous day: 02/10 0701 - 02/11 0700 In: 3380.2 [P.O.:1440; I.V.:275; IV Piggyback:250; TPN:1415.2] Out: 5120 [Urine:4600; Drains:20; Stool:500] Intake/Output this shift: Total I/O In: -  Out: 205 [Drains:5; Stool:200]  PE: General- In NAD Cardiovascular-slightly increased rate Lungs-slightly coarse breath sounds. Abdomen-soft, loop ileostomy pink with green liquid output, midline wound open, clean and redressed, LLQ open wound is clean, serous drain output  Lab Results:   Recent Labs  05/28/12 0420 05/29/12 0440  WBC 10.3 10.7*  HGB 8.8* 8.7*  HCT 26.0* 25.9*  PLT 296 315   BMET  Recent Labs  05/28/12 0420 05/29/12 0440  NA 129* 131*  K 3.1* 3.3*  CL 90* 93*  CO2 32 29  GLUCOSE 133* 174*  BUN 12 12  CREATININE 0.56 0.57  CALCIUM 8.0* 8.3*   PT/INR No results found for this basename: LABPROT, INR,  in the last 72 hours Comprehensive Metabolic Panel:    Component Value Date/Time   NA 131* 05/29/2012 0440   NA 130* 02/09/2012 1321   K 3.3* 05/29/2012 0440   K 4.6 02/09/2012 1321   CL 93* 05/29/2012 0440   CL 101 02/09/2012 1321   CO2 29 05/29/2012 0440   CO2 18* 02/09/2012 1321   BUN 12 05/29/2012 0440   BUN 21.0 02/09/2012 1321   CREATININE 0.57 05/29/2012 0440   CREATININE 0.8 02/09/2012 1321   GLUCOSE 174* 05/29/2012 0440   GLUCOSE 79 02/09/2012 1321   CALCIUM 8.3* 05/29/2012 0440   CALCIUM  9.2 02/09/2012 1321   AST 12 05/28/2012 0420   AST 53* 02/09/2012 1321   ALT 7 05/28/2012 0420   ALT 56* 02/09/2012 1321   ALKPHOS 234* 05/28/2012 0420   ALKPHOS 712* 02/09/2012 1321   BILITOT 0.3 05/28/2012 0420   BILITOT 0.50 02/09/2012 1321   PROT 5.1* 05/28/2012 0420   PROT 7.0 02/09/2012 1321   ALBUMIN 1.8* 05/28/2012 0420   ALBUMIN 3.8 02/09/2012 1321     Studies/Results: Dg Chest 2 View  05/29/2012  *RADIOLOGY REPORT*  Clinical Data: Productive cough, low grade leukocytosis, weakness, shortness of breath  CHEST - 2 VIEW  Comparison: 05/19/2012  Findings: Right arm PICC line, tip projecting over SVC. Enlargement of cardiac silhouette. Tortuous aorta. Pulmonary vascularity normal. Minimal atelectasis at minor fissure. Atelectasis versus consolidation medial left lower lobe. Remaining lungs clear. No gross pleural effusion or pneumothorax. Biconvex thoracic scoliosis.  IMPRESSION: Atelectasis versus consolidation left lower lobe, slightly increased. Minimal atelectasis at minor fissure. Mild enlargement of cardiac silhouette.   Original Report Authenticated By: Ulyses Southward, M.D.     Anti-infectives: Anti-infectives   Start     Dose/Rate Route Frequency Ordered Stop   05/22/12 0800  ertapenem (INVANZ) 1 g in sodium chloride 0.9 % 50 mL IVPB     1 g 100 mL/hr over 30 Minutes Intravenous Every 24 hours 05/22/12 0733     05/17/12 2000  cefoTEtan (CEFOTAN) 2 g in dextrose 5 % 50 mL IVPB     2 g 100 mL/hr over 30 Minutes Intravenous Every 12 hours 05/17/12 1312 05/17/12 2114   05/17/12 0606  cefOXitin (MEFOXIN) 2 g in dextrose 5 % 50 mL IVPB     2 g 100 mL/hr over 30 Minutes Intravenous On call to O.R. 05/17/12 0606 05/17/12 0751      Assessment Active Problems: 1.  Chronic ischemic colitis s/p reversal of colostomy, partial colectomy, takedown of mucous fistula 05/17/12  2.  Postop SBO, microleak from anastomosis and abscess s/p expl lap, loa, abscess drainage, oversewing of leak, loop  ileostomy 05/22/12-on IV InVanz; very slowly improving 3.  Afib-back on preop Beta blocker 4.  Chronic anemia with ABL- hemoglobin stable 5.  Chronic hyponatremia 6.  CMML (chronic myelomonocytic leukemia 7.  Hypomagnesemia-supplement ordered 8.  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  Hypokalemia-supplement ordered. 10.  Severe deconditioned state     LOS: 12 days   Plan:   Advanced to a bland diet. Sputum culture. Recheck electrolytes tomorrow. Inpatient rehabilitation consult. Continue IV antibiotics and TPN. Continue therapies.  [name] 05/29/2012

## 2012-05-30 LAB — CBC
MCH: 32.6 pg (ref 26.0–34.0)
MCV: 95.8 fL (ref 78.0–100.0)
Platelets: 330 10*3/uL (ref 150–400)
RDW: 14.1 % (ref 11.5–15.5)
WBC: 8.3 10*3/uL (ref 4.0–10.5)

## 2012-05-30 LAB — EXPECTORATED SPUTUM ASSESSMENT W GRAM STAIN, RFLX TO RESP C

## 2012-05-30 LAB — GLUCOSE, CAPILLARY

## 2012-05-30 LAB — BASIC METABOLIC PANEL
BUN: 12 mg/dL (ref 6–23)
Calcium: 8.2 mg/dL — ABNORMAL LOW (ref 8.4–10.5)
GFR calc non Af Amer: >90 mL/min (ref >90)
Glucose, Bld: 130 mg/dL — ABNORMAL HIGH (ref 70–99)
Sodium: 130 mEq/L — ABNORMAL LOW (ref 135–145)

## 2012-05-30 MED ORDER — CLINIMIX E/DEXTROSE (5/20) 5 % IV SOLN
INTRAVENOUS | Status: AC
  Administered 2012-05-30: 17:00:00 via INTRAVENOUS
  Filled 2012-05-30: qty 2000

## 2012-05-30 MED ORDER — MAGNESIUM SULFATE 40 MG/ML IJ SOLN
4.0000 g | Freq: Once | INTRAMUSCULAR | Status: AC
  Administered 2012-05-30: 4 g via INTRAVENOUS
  Filled 2012-05-30 (×2): qty 100

## 2012-05-30 MED ORDER — POTASSIUM CHLORIDE 10 MEQ/50ML IV SOLN
10.0000 meq | INTRAVENOUS | Status: AC
  Administered 2012-05-30 (×6): 10 meq via INTRAVENOUS
  Filled 2012-05-30 (×6): qty 50

## 2012-05-30 MED ORDER — OXYCODONE HCL 5 MG PO TABS
5.0000 mg | ORAL_TABLET | ORAL | Status: DC | PRN
  Administered 2012-05-30 – 2012-06-06 (×20): 5 mg via ORAL
  Filled 2012-05-30 (×20): qty 1

## 2012-05-30 MED ORDER — ENSURE COMPLETE PO LIQD
237.0000 mL | Freq: Three times a day (TID) | ORAL | Status: DC
  Administered 2012-05-30 – 2012-06-04 (×5): 237 mL via ORAL

## 2012-05-30 MED ORDER — FAT EMULSION 20 % IV EMUL
240.0000 mL | INTRAVENOUS | Status: AC
  Administered 2012-05-30: 240 mL via INTRAVENOUS
  Filled 2012-05-30: qty 250

## 2012-05-30 MED ORDER — HYDROMORPHONE HCL PF 1 MG/ML IJ SOLN
0.5000 mg | INTRAMUSCULAR | Status: DC | PRN
  Administered 2012-05-31: 1 mg via INTRAVENOUS
  Administered 2012-05-31 (×3): 0.5 mg via INTRAVENOUS
  Administered 2012-05-31 – 2012-06-01 (×2): 1 mg via INTRAVENOUS
  Filled 2012-05-30 (×6): qty 1

## 2012-05-30 NOTE — Progress Notes (Signed)
PARENTERAL NUTRITION CONSULT NOTE - FOLLOW UP  Pharmacy Consult for TNA Indication: Prolonged ileus  Allergies  Allergen Reactions  . Vancomycin Hives and Rash    ? wheezing  . Codeine Nausea And Vomiting  . Tetanus Toxoids Other (See Comments)    serum  . Penicillins Hives and Rash  . Xarelto (Rivaroxaban) Hives and Rash    ?    Patient Measurements: Height: 5\' 5"  (165.1 cm) Weight: 166 lb 12.8 oz (75.66 kg) IBW/kg (Calculated) : 57 Adjusted Body Weight: 65.6kg Usual Weight: 75.4kg  Vital Signs: Temp: 99.2 F (37.3 C) (02/12 0627) Temp src: Oral (02/12 0627) BP: 147/77 mmHg (02/12 0627) Pulse Rate: 103 (02/12 0627) Intake/Output from previous day: 02/11 0701 - 02/12 0700 In: 1885 [P.O.:240; I.V.:235; TPN:1410] Out: 4520 [Urine:3600; Drains:20; Stool:900] Intake/Output from this shift:    Labs:  Recent Labs  05/28/12 0420 05/29/12 0440 05/30/12 0530  WBC 10.3 10.7* 8.3  HGB 8.8* 8.7* 8.5*  HCT 26.0* 25.9* 25.0*  PLT 296 315 330     Recent Labs  05/28/12 0420 05/29/12 0440 05/30/12 0530  NA 129* 131* 130*  K 3.1* 3.3* 3.5  CL 90* 93* 95*  CO2 32 29 27  GLUCOSE 133* 174* 130*  BUN 12 12 12   CREATININE 0.56 0.57 0.51  CALCIUM 8.0* 8.3* 8.2*  MG 1.2* 1.4* 1.5  PHOS 2.9  --   --   PROT 5.1*  --   --   ALBUMIN 1.8*  --   --   AST 12  --   --   ALT 7  --   --   ALKPHOS 234*  --   --   BILITOT 0.3  --   --   PREALBUMIN 7.5*  --   --   TRIG 105  --   --   CHOL 108  --   --    Estimated Creatinine Clearance: 73.3 ml/min (by C-G formula based on Cr of 0.51).    Recent Labs  05/29/12 1624 05/30/12 0010 05/30/12 0738  GLUCAP 114* 122* 120*    Medications:  Scheduled:  . antiseptic oral rinse  15 mL Mouth Rinse BID  . bisoprolol  5 mg Oral Daily  . celecoxib  200 mg Oral Daily  . [EXPIRED] Chlorhexidine Gluconate Cloth  6 each Topical Q0600  . enoxaparin (LOVENOX) injection  40 mg Subcutaneous Q24H  . ertapenem  1 g Intravenous Q24H  .  insulin aspart  0-9 Units Subcutaneous Q8H  . [COMPLETED] magnesium sulfate 1 - 4 g bolus IVPB  4 g Intravenous Once  . multivitamins with iron  1 tablet Oral Daily  . pantoprazole (PROTONIX) IV  40 mg Intravenous QHS  . [COMPLETED] potassium chloride  10 mEq Intravenous Q1 Hr x 6   Infusions:  . dextrose 5% lactated ringers 10 mL/hr (05/29/12 1719)  . [EXPIRED] TPN (CLINIMIX) +/- additives 60 mL/hr at 05/28/12 1729   And  . [EXPIRED] fat emulsion 250 mL (05/28/12 1729)  . liver oil-zinc oxide Stopped (05/22/12 1515)  . TPN (CLINIMIX) +/- additives 60 mL/hr at 05/29/12 1752    Insulin Requirements in the past 24 hours:  1 unit Novolog sensitive scale  Nutritional Goals:  Per RD 2/6: Kcal: 1660-1930, Protein: 76-91grams Clinimix E 5/20@ goal rate of 70 ml/hr + IVF 20% @10  ml/hr on MWF will provide: 84 g protein/day, 1478 kcal on non-lipid days, 1958 kcal on lipid days (average of 1684 kcal/day of the week)  Current Nutrition:  Bland Diet (Advanced 2/11am) Clinimix E5/20 at 60 ml/hr Lipids at 10 ml/hr MWF  IV: D5LR at [facility]  Assessment: 62 yo F with chronic ischemic colitis s/p emergency L colectomy 12/2011 presented 05/17/2012 for partial colectomy, takedown of mucous fistula and colostomy reversal. On 2/4, patient found with SBO, intra-abd abscess, and small anastomotic leak. Patient returned to the OR on 2/4 for exploratory laparotomy, lysis of adhesions for SBO, drainage of intra-abd abscess, oversewing of anastomotic leak and loop ileostomy. TNA started on 2/6.  Labs:  Electrolytes: K and Mg now wnl but on low end despite aggressive replacement - MD already ordered replacements. Na and Cl low (unable to adjust concentration of lytes in TNA). Corrected Ca wnl. Renal Function: Scr and BUN wnl/stable. Hepatic Function: Alk phos elevated 234, AST/ALT, bilirubin wnl (2/10)  Pre-Albumin: low, 7.8 (2/6), 7.5 (2/10) Triglycerides: normal, 123 (2/6), 105 (2/10) CBGs: at goal <150 PO  Intake: Tolerating bland diet per MD  2/12: D#7 of TNA, appears to be showing signs of re-feeding syndrome since patient has been NPO for a prolonged period of time. Diet advanced to clears 2/9, then FLD on 2/10, Bland on 2/10, then Regular 2/11   Plan:  At 1800 today:  Due to electrolyte abnormalities, continue with Clinimix E5/20 to 63ml/hr today - advancing slowly due to re-feeding (goal = 2ml/hr)  Fat emulsion at 106ml/hr (MWF only due to ongoing shortage).  PO multivitamin with iron ordered, therefore No MVI and trace elements to due to ongoing shortage  [facility] at [facility] - consider changing to NS if sodium remains low  Conitinue Novolog SSI q8h with sensitive scale coverage  TNA lab panels on Mondays & Thursdays.  F/U BMET and Mg in am (already ordered)  Hopefully can d/c TNA soon, now that patient is consuming PO diet  Darrol Angel, PharmD Pager: [phone] 05/30/2012,8:27 AM

## 2012-05-30 NOTE — Progress Notes (Signed)
Clinical Social Work Department BRIEF PSYCHOSOCIAL ASSESSMENT 05/30/2012  Patient:  [name], [name]     Account Number:  [id number]     Admit date:  05/17/2012  Clinical Social Worker:  Orpah Greek  Date/Time:  05/30/2012 11:14 AM  Referred by:  Physician  Date Referred:  05/30/2012 Referred for  SNF Placement   Other Referral:   Interview type:  Family Other interview type:    PSYCHOSOCIAL DATA Living Status:  WITH ADULT CHILDREN Admitted from facility:   Level of care:   Primary support name:  Quita Skye (sister) h#: [phone] c#: [phone] Primary support relationship to patient:  SIBLING Degree of support available:   good    CURRENT CONCERNS Current Concerns  Post-Acute Placement   Other Concerns:    SOCIAL WORK ASSESSMENT / PLAN CSW attempted to speak with patient, but patient was confused/thought her daughter was in the bathroom but per nurse tech, no family had visited today. CSW spoke with patient's sister, Quita Skye re: discharge planning. Note PT recommended SNF.   Assessment/plan status:  Information/Referral to [facility] Other assessment/ plan:   Information/referral to community resources:   CSW completed FL2 and faxed information out to [facility] - will provide bed offers when patient's sister & son, Tasia Catchings visits on Friday.    PATIENT'S/FAMILY'S RESPONSE TO PLAN OF CARE: Patient's sister is agreeable with plan for SNF at discharge. States that her daughter, Leotis Shames is not very helpful at home with patient. Anticipating TPN to be discontinued prior to discharge.        Unice Bailey, LCSW [facility] Clinical Social Worker cell #: [phone]

## 2012-05-30 NOTE — Progress Notes (Signed)
Rehab admissions - Evaluated for possible admission.  Please see rehab consult done by Dr. Riley Kill recommending SNF.  Depending on progress agree with need for SNF versus HH.  At this point, she likely would benefit most from SNF.  Call me for questions.  #[phone]

## 2012-05-30 NOTE — Progress Notes (Signed)
Patient's sister is agreeable with plan for SNF at discharge. States that her daughter, Leotis Shames is not very helpful at home with patient. CSW will provide bed offers when patient's sister & son, Tasia Catchings visits on Friday.   Dr. Abbey Chatters - please sign FL2 on shadow chart in [facility].   Clinical Social Work Department CLINICAL SOCIAL WORK PLACEMENT NOTE 05/30/2012  Patient:  TRACINA, BEAUMONT  Account Number:  [id number] Admit date:  05/17/2012  Clinical Social Worker:  Orpah Greek  Date/time:  05/30/2012 12:07 PM  Clinical Social Work is seeking post-discharge placement for this patient at the following level of care:   SKILLED NURSING   (*CSW will update this form in Epic as items are completed)   05/30/2012  Patient/family provided with [facility] Department of Clinical Social Work's list of facilities offering this level of care within the geographic area requested by the patient (or if unable, by the patient's family).  05/30/2012  Patient/family informed of their freedom to choose among providers that offer the needed level of care, that participate in Medicare, Medicaid or managed care program needed by the patient, have an available bed and are willing to accept the patient.  05/30/2012  Patient/family informed of [facility]' ownership interest in [facility], as well as of the fact that they are under no obligation to receive care at this facility.  PASARR submitted to EDS on 05/30/2012 PASARR number received from EDS on 05/30/2012  FL2 transmitted to all facilities in geographic area requested by pt/family on  05/30/2012 FL2 transmitted to all facilities within larger geographic area on   Patient informed that his/her managed care company has contracts with or will negotiate with  certain facilities, including the following:     Patient/family informed of bed offers received:   Patient chooses bed at  Physician recommends and patient chooses bed at    Patient  to be transferred to  on   Patient to be transferred to facility by   The following physician request were entered in Epic:   Additional Comments:  Unice Bailey, LCSW [facility] Clinical Social Worker cell #: [phone]

## 2012-05-30 NOTE — Progress Notes (Signed)
8 Days Post-Op  Subjective: She is still having episodes of confusion at times.  She ate okay yesterday.  Still coughing.    Objective: Vital signs in last 24 hours: Temp:  [98.2 F (36.8 C)-99.2 F (37.3 C)] 99.2 F (37.3 C) (02/12 0627) Pulse Rate:  [98-103] 103 (02/12 0627) Resp:  [17-18] 18 (02/12 0627) BP: (127-150)/(76-82) 147/77 mmHg (02/12 0627) SpO2:  [96 %-99 %] 96 % (02/12 0627) Last BM Date: 05/30/12  Intake/Output from previous day: 02/11 0701 - 02/12 0700 In: 1885 [P.O.:240; I.V.:235; TPN:1410] Out: 4520 [Urine:3600; Drains:20; Stool:900] Intake/Output this shift:    PE: General- In NAD Cardiovascular-slightly increased rate, regular rhythm Abdomen-soft, loop ileostomy pink with green liquid output, midline wound open, clean,some fascial dehiscence is starting and underlying tissue is stuck, LLQ open wound is clean, serous drain output  Lab Results:   Recent Labs  05/29/12 0440 05/30/12 0530  WBC 10.7* 8.3  HGB 8.7* 8.5*  HCT 25.9* 25.0*  PLT 315 330   BMET  Recent Labs  05/29/12 0440 05/30/12 0530  NA 131* 130*  K 3.3* 3.5  CL 93* 95*  CO2 29 27  GLUCOSE 174* 130*  BUN 12 12  CREATININE 0.57 0.51  CALCIUM 8.3* 8.2*   PT/INR No results found for this basename: LABPROT, INR,  in the last 72 hours Comprehensive Metabolic Panel:    Component Value Date/Time   NA 130* 05/30/2012 0530   NA 130* 02/09/2012 1321   K 3.5 05/30/2012 0530   K 4.6 02/09/2012 1321   CL 95* 05/30/2012 0530   CL 101 02/09/2012 1321   CO2 27 05/30/2012 0530   CO2 18* 02/09/2012 1321   BUN 12 05/30/2012 0530   BUN 21.0 02/09/2012 1321   CREATININE 0.51 05/30/2012 0530   CREATININE 0.8 02/09/2012 1321   GLUCOSE 130* 05/30/2012 0530   GLUCOSE 79 02/09/2012 1321   CALCIUM 8.2* 05/30/2012 0530   CALCIUM 9.2 02/09/2012 1321   AST 12 05/28/2012 0420   AST 53* 02/09/2012 1321   ALT 7 05/28/2012 0420   ALT 56* 02/09/2012 1321   ALKPHOS 234* 05/28/2012 0420   ALKPHOS 712*  02/09/2012 1321   BILITOT 0.3 05/28/2012 0420   BILITOT 0.50 02/09/2012 1321   PROT 5.1* 05/28/2012 0420   PROT 7.0 02/09/2012 1321   ALBUMIN 1.8* 05/28/2012 0420   ALBUMIN 3.8 02/09/2012 1321     Studies/Results: Dg Chest 2 View  05/29/2012  *RADIOLOGY REPORT*  Clinical Data: Productive cough, low grade leukocytosis, weakness, shortness of breath  CHEST - 2 VIEW  Comparison: 05/19/2012  Findings: Right arm PICC line, tip projecting over SVC. Enlargement of cardiac silhouette. Tortuous aorta. Pulmonary vascularity normal. Minimal atelectasis at minor fissure. Atelectasis versus consolidation medial left lower lobe. Remaining lungs clear. No gross pleural effusion or pneumothorax. Biconvex thoracic scoliosis.  IMPRESSION: Atelectasis versus consolidation left lower lobe, slightly increased. Minimal atelectasis at minor fissure. Mild enlargement of cardiac silhouette.   Original Report Authenticated By: Ulyses Southward, M.D.     Anti-infectives: Anti-infectives   Start     Dose/Rate Route Frequency Ordered Stop   05/22/12 0800  ertapenem (INVANZ) 1 g in sodium chloride 0.9 % 50 mL IVPB     1 g 100 mL/hr over 30 Minutes Intravenous Every 24 hours 05/22/12 0733     05/17/12 2000  cefoTEtan (CEFOTAN) 2 g in dextrose 5 % 50 mL IVPB     2 g 100 mL/hr over 30 Minutes Intravenous Every 12 hours  05/17/12 1312 05/17/12 2114   05/17/12 0606  cefOXitin (MEFOXIN) 2 g in dextrose 5 % 50 mL IVPB     2 g 100 mL/hr over 30 Minutes Intravenous On call to O.R. 05/17/12 0606 05/17/12 0751      Assessment Active Problems: 1.  Chronic ischemic colitis s/p reversal of colostomy, partial colectomy, takedown of mucous fistula 05/17/12  2.  Postop SBO, microleak from anastomosis and abscess s/p expl lap, loa, abscess drainage, oversewing of leak, loop ileostomy 05/22/12-some midline fascia dehiscence, she is in no shape physically or nutritionally to go back to OR. 3.  Afib-SR on preop Beta blocker 4.  Chronic anemia  with ABL- hemoglobin stable 5.  Chronic hyponatremia 6.  CMML (chronic myelomonocytic leukemia 7.  Hypomagnesemia-Mg lower level of normal range 8.  PC malnutrition-on TPN; tolerating some of the bland diet. 9.  Hypokalemia-K+ level 3.5 10.  Severe deconditioned state-waiting on CIR recommendations. 11.  Confusion-multifactorial     LOS: 13 days   Plan:   Advanced to a regular diet.  Await sputum culture results. Recheck electrolytes tomorrow. Stop antibiotics. Continue TPN. Continue therapies.  Arieonna Medine J 05/30/2012

## 2012-05-30 NOTE — Consult Note (Signed)
WOC ostomy consult  Stoma type/location: RUQ ileostomy Stomal assessment/size: approximately 2 inches round with red rubber catheter "rod" in place Peristomal assessment: intact, clear Treatment options for stomal/peristomal skin: None inidcated Output:  Dark yellow/green effluent Ostomy pouching: 1pc. (Pouch is [facility] [place] [phone]) Extra supplies left in room (three pouches, sizing guide, scissors and 1 barrier ring in the event that it should be necessary in the future) in preparation for transfer. Registered with [facility]. WOC team will remain available as needed to this patient, her family and nursing and medical teams. Please re-consult if needed in-between visits. Thanks, Ladona Mow, MSN, RN, [facility], CWOCN [phone])

## 2012-05-31 LAB — COMPREHENSIVE METABOLIC PANEL
ALT: 23 U/L (ref 0–35)
AST: 30 U/L (ref 0–37)
Albumin: 2.2 g/dL — ABNORMAL LOW (ref 3.5–5.2)
Alkaline Phosphatase: 456 U/L — ABNORMAL HIGH (ref 39–117)
CO2: 25 mEq/L (ref 19–32)
Chloride: 96 mEq/L (ref 96–112)
Creatinine, Ser: 0.55 mg/dL (ref 0.50–1.10)
GFR calc non Af Amer: >90 mL/min (ref >90)
Potassium: 3.7 mEq/L (ref 3.5–5.1)
Sodium: 130 mEq/L — ABNORMAL LOW (ref 135–145)
Total Bilirubin: 0.3 mg/dL (ref 0.3–1.2)

## 2012-05-31 LAB — GLUCOSE, CAPILLARY
Glucose-Capillary: 110 mg/dL — ABNORMAL HIGH (ref 70–99)
Glucose-Capillary: 110 mg/dL — ABNORMAL HIGH (ref 70–99)
Glucose-Capillary: 114 mg/dL — ABNORMAL HIGH (ref 70–99)

## 2012-05-31 LAB — PHOSPHORUS: Phosphorus: 3.1 mg/dL (ref 2.3–4.6)

## 2012-05-31 MED ORDER — CLINIMIX E/DEXTROSE (5/20) 5 % IV SOLN
INTRAVENOUS | Status: AC
  Administered 2012-05-31: 18:00:00 via INTRAVENOUS
  Filled 2012-05-31: qty 1000

## 2012-05-31 NOTE — Progress Notes (Signed)
PARENTERAL NUTRITION CONSULT NOTE - FOLLOW UP  Pharmacy Consult for TNA Indication: Prolonged ileus  Allergies  Allergen Reactions  . Vancomycin Hives and Rash    ? wheezing  . Codeine Nausea And Vomiting  . Tetanus Toxoids Other (See Comments)    serum  . Penicillins Hives and Rash  . Xarelto (Rivaroxaban) Hives and Rash    ?    Patient Measurements: Height: 5\' 5"  (165.1 cm) Weight: 166 lb 12.8 oz (75.66 kg) IBW/kg (Calculated) : 57 Adjusted Body Weight: 65.6kg Usual Weight: 75.4kg  Vital Signs: Temp: 99.2 F (37.3 C) (02/13 0521) Temp src: Oral (02/13 0521) BP: 138/72 mmHg (02/13 0521) Pulse Rate: 96 (02/13 0521) Intake/Output from previous day: 02/12 0701 - 02/13 0700 In: 940 [P.O.:240; I.V.:120; IV Piggyback:300; TPN:280] Out: 3280 [Urine:2975; Drains:5; Stool:300] Intake/Output from this shift:    Labs:  Recent Labs  05/29/12 0440 05/30/12 0530  WBC 10.7* 8.3  HGB 8.7* 8.5*  HCT 25.9* 25.0*  PLT 315 330     Recent Labs  05/29/12 0440 05/30/12 0530 05/31/12 0615  NA 131* 130* 130*  K 3.3* 3.5 3.7  CL 93* 95* 96  CO2 29 27 25   GLUCOSE 174* 130* 119*  BUN 12 12 12   CREATININE 0.57 0.51 0.55  CALCIUM 8.3* 8.2* 8.3*  MG 1.4* 1.5 1.5  PHOS  --   --  3.1  PROT  --   --  5.8*  ALBUMIN  --   --  2.2*  AST  --   --  30  ALT  --   --  23  ALKPHOS  --   --  456*  BILITOT  --   --  0.3   Estimated Creatinine Clearance: 73.3 ml/min (by C-G formula based on Cr of 0.55).    Recent Labs  05/30/12 2157 05/31/12 0031 05/31/12 0725  GLUCAP 129* 110* 121*    Medications:  Scheduled:  . antiseptic oral rinse  15 mL Mouth Rinse BID  . bisoprolol  5 mg Oral Daily  . celecoxib  200 mg Oral Daily  . enoxaparin (LOVENOX) injection  40 mg Subcutaneous Q24H  . feeding supplement  237 mL Oral TID WC  . insulin aspart  0-9 Units Subcutaneous Q8H  . [COMPLETED] magnesium sulfate 1 - 4 g bolus IVPB  4 g Intravenous Once  . multivitamins with iron  1  tablet Oral Daily  . pantoprazole (PROTONIX) IV  40 mg Intravenous QHS  . [COMPLETED] potassium chloride  10 mEq Intravenous Q1 Hr x 6   Infusions:  . dextrose 5% lactated ringers 10 mL/hr (05/29/12 1719)  . fat emulsion 240 mL (05/30/12 1727)  . liver oil-zinc oxide Stopped (05/22/12 1515)  . [EXPIRED] TPN (CLINIMIX) +/- additives 60 mL/hr at 05/29/12 1752  . TPN (CLINIMIX) +/- additives 60 mL/hr at 05/30/12 1726    Insulin Requirements in the past 24 hours:  2 units Novolog sensitive scale  Nutritional Goals:  Per RD 2/6: Kcal: 1660-1930, Protein: 76-91grams Clinimix E 5/20@ goal rate of 70 ml/hr + IVF 20% @10  ml/hr on MWF will provide: 84 g protein/day, 1478 kcal on non-lipid days, 1958 kcal on lipid days (average of 1684 kcal/day of the week)  Current Nutrition:  Regular Diet (Advanced 2/12) Clinimix E5/20 at 60 ml/hr Lipids at 10 ml/hr MWF  IV: D5LR at [facility]  Assessment: 62 yo F with chronic ischemic colitis s/p emergency L colectomy 12/2011 presented 05/17/2012 for partial colectomy, takedown of mucous fistula and colostomy  reversal. On 2/4, patient found with SBO, intra-abd abscess, and small anastomotic leak. Patient returned to the OR on 2/4 for exploratory laparotomy, lysis of adhesions for SBO, drainage of intra-abd abscess, oversewing of anastomotic leak and loop ileostomy. TNA started on 2/6.  Labs:  Electrolytes: K and Mg still wnl but on low end despite several days of aggressive replacement - will not order supplements today. Na low (unable to adjust concentration of lytes in TNA). Corrected Ca wnl (~9.7). Renal Function: Scr and BUN wnl/stable. Hepatic Function: Alk phos elevated and trending up. Anticipate TNA will be d/c'd soon. AST/ALT and bilirubin wnl. Pre-Albumin: low, 7.8 (2/6), 7.5 (2/10) Triglycerides: normal, 123 (2/6), 105 (2/10) CBGs: at goal <150 PO Intake: Tolerating regular diet per MD  2/13: D#8 of TNA. K, Mg, and Phos are now corrected. Diet  advanced to clears 2/9, then FLD on 2/10, Bland on 2/10, then Regular on 2/11. Pt reportedly eating some. Per discussion with RN, pt doesn't really have an appetite - eating some of Ensure supplements but not all. Will slow down TNA rate slightly to encourage PO intake.  Plan:  At 1800 today:  Decrease Clinimix E5/20 to 42ml/hr tonight to encourage PO intake and avoid potential "full" feeling.  Fat emulsion at 25ml/hr (MWF only due to ongoing shortage).  PO multivitamin with iron ordered, therefore No MVI and trace elements to due to ongoing shortage  Continue IVF at [facility]  Conitinue Novolog SSI q8h with sensitive scale coverage  TNA lab panels on Mondays & Thursdays.  F/U BMET and Mg in am (already ordered)  Hopefully can d/c TNA soon, now that patient is consuming PO diet  Darrol Angel, PharmD Pager: [phone] 05/31/2012,11:01 AM

## 2012-05-31 NOTE — Progress Notes (Signed)
9 Days Post-Op  Subjective: Less confused today.  Leaking around ileostomy site.  Eating a little.    Objective: Vital signs in last 24 hours: Temp:  [98.2 F (36.8 C)-99.2 F (37.3 C)] 99.2 F (37.3 C) (02/13 0521) Pulse Rate:  [93-106] 96 (02/13 0521) Resp:  [18] 18 (02/13 0521) BP: (121-159)/(72-91) 138/72 mmHg (02/13 0521) SpO2:  [94 %-96 %] 94 % (02/13 0521) Last BM Date: 05/31/12  Intake/Output from previous day: 02/12 0701 - 02/13 0700 In: 940 [P.O.:240; I.V.:120; IV Piggyback:300; TPN:280] Out: 3280 [Urine:2975; Drains:5; Stool:300] Intake/Output this shift:    PE: General- In NAD Abdomen-soft, loop ileostomy pink with green liquid output with some leaking inferiorly, midline wound open, clean,some fascial dehiscence, underlying tissue is stuck, LLQ open wound is clean, serous drain output  Lab Results:   Recent Labs  05/29/12 0440 05/30/12 0530  WBC 10.7* 8.3  HGB 8.7* 8.5*  HCT 25.9* 25.0*  PLT 315 330   BMET  Recent Labs  05/30/12 0530 05/31/12 0615  NA 130* 130*  K 3.5 3.7  CL 95* 96  CO2 27 25  GLUCOSE 130* 119*  BUN 12 12  CREATININE 0.51 0.55  CALCIUM 8.2* 8.3*   PT/INR No results found for this basename: LABPROT, INR,  in the last 72 hours Comprehensive Metabolic Panel:    Component Value Date/Time   NA 130* 05/31/2012 0615   NA 130* 02/09/2012 1321   K 3.7 05/31/2012 0615   K 4.6 02/09/2012 1321   CL 96 05/31/2012 0615   CL 101 02/09/2012 1321   CO2 25 05/31/2012 0615   CO2 18* 02/09/2012 1321   BUN 12 05/31/2012 0615   BUN 21.0 02/09/2012 1321   CREATININE 0.55 05/31/2012 0615   CREATININE 0.8 02/09/2012 1321   GLUCOSE 119* 05/31/2012 0615   GLUCOSE 79 02/09/2012 1321   CALCIUM 8.3* 05/31/2012 0615   CALCIUM 9.2 02/09/2012 1321   AST 30 05/31/2012 0615   AST 53* 02/09/2012 1321   ALT 23 05/31/2012 0615   ALT 56* 02/09/2012 1321   ALKPHOS 456* 05/31/2012 0615   ALKPHOS 712* 02/09/2012 1321   BILITOT 0.3 05/31/2012 0615   BILITOT  0.50 02/09/2012 1321   PROT 5.8* 05/31/2012 0615   PROT 7.0 02/09/2012 1321   ALBUMIN 2.2* 05/31/2012 0615   ALBUMIN 3.8 02/09/2012 1321     Studies/Results: No results found.  Anti-infectives: Anti-infectives   Start     Dose/Rate Route Frequency Ordered Stop   05/22/12 0800  ertapenem (INVANZ) 1 g in sodium chloride 0.9 % 50 mL IVPB  Status:  Discontinued     1 g 100 mL/hr over 30 Minutes Intravenous Every 24 hours 05/22/12 0733 05/30/12 0842   05/17/12 2000  cefoTEtan (CEFOTAN) 2 g in dextrose 5 % 50 mL IVPB     2 g 100 mL/hr over 30 Minutes Intravenous Every 12 hours 05/17/12 1312 05/17/12 2114   05/17/12 0606  cefOXitin (MEFOXIN) 2 g in dextrose 5 % 50 mL IVPB     2 g 100 mL/hr over 30 Minutes Intravenous On call to O.R. 05/17/12 0606 05/17/12 0751      Assessment Active Problems: 1.  Chronic ischemic colitis s/p reversal of colostomy, partial colectomy, takedown of mucous fistula 05/17/12  2.  Postop SBO, microleak from anastomosis and abscess s/p expl lap, loa, abscess drainage, oversewing of leak, loop ileostomy 05/22/12-some midline fascia dehiscence, she is in no shape physically or nutritionally to go back to OR. 3.  Afib-SR on preop Beta blocker 4.  Chronic anemia with ABL- hemoglobin stable 5.  Chronic hyponatremia 6.  CMML (chronic myelomonocytic leukemia 7.  Hypomagnesemia-Mg remains at lower level of normal range 8.  PC malnutrition-on TPN; eating a little of the regular diet. 9.  Hypokalemia-K+ level 3.7 10.  Severe deconditioned state-not a candidate for CIR; SNF recommended and this was discussed with her. 11.  Confusion-improving.     LOS: 14 days   Plan:   Await sputum culture results. Recheck electrolytes tomorrow.  Continue TPN. Continue therapies.  Will have WOC nurse work with appliance-may remove rod with next change.  [name] 05/31/2012

## 2012-05-31 NOTE — Consult Note (Addendum)
WOC ostomy consult:  Patient not (physically) seen today; telephonic consultation with RN at bedside (D. Dark)  Stoma type/location: RUQ ileostomy Stomal assessment/size: approximately 2 inches round.  Red rubber catheter is discontinued today by clipping sutures at each end and sliding out gently.  Patient tolerated this without discomfort. Peristomal assessment: Patient's pouch leaked during night and skin beneath pouch and on lower right abdomen is erythematous.  Treatment options for stomal/peristomal skin: RN cleansed and treated skin with our house emollient products. Output: Liquid brown stool Ostomy pouching: 1pc pouching system with built-in convexity (Pouch: [employer] [place] [phone]) tried today.  I suspect that pouch may have over-filled.  Will ask staff to check pouching system every 2 hours around the clock and to empty each time.  I will monitor closely, albeit remotely. Thanks, Ladona Mow, MSN, RN, [facility], CWOCN [phone])

## 2012-05-31 NOTE — Progress Notes (Signed)
Assessed ileostomy pouch for leakage. No leakage present. Will continue to assess. Maeola Harman

## 2012-05-31 NOTE — Progress Notes (Signed)
NUTRITION FOLLOW UP  Intervention:   - Encouraged bland non-acidic food choices with mouth sores - Encouraged pt to continue to increase meal and supplement intake - Anti-emetics per MD - TPN per pharmacy - RD to continue to monitor   Nutrition Dx:   Inadequate oral intake related to current medical state s/p surgery and prolonged ileus as evidenced by NPO status - no longer appropriate, diet advanced.   New nutrition dx: Inadequate oral intake related to poor appetite, nausea, mouth sores as evidenced by <50% meal intake.   Goal:   Pt to meet >/= 90% of their estimated nutrition needs - met as close as possible using premixed formula  New goal: Pt to consume >50% of meals/supplements.   Monitor:   Weights, labs, intake, TPN, BM  Assessment:   Pt started on regular diet yesterday. Getting Ensure Complete TID. Pt reports she has only been drinking 1 Ensure/day. Pt reports she is just not hungry. Pt developed mouth sores a few days ago, visible on lips. Pt reports she gets nauseated from smelling her ileostomy output when it leaks.   TPN: Clinimix E 5/20 @ 60 ml/hr.  Lipids (20% IVFE @ 10 ml/hr), multivitamins, and trace elements are provided 3 times weekly (MWF) due to national backorder.  Provides 1473 kcal and 72 grams protein daily (based on weekly average).  Meets 89% minimum estimated kcal and 95% minimum estimated protein needs.  Pt with persistent low sodium  Alk phos elevated and continues to rise PALB stable  CBGs <150 mg/dL  Height: Ht Readings from Last 1 Encounters:  05/28/12 5\' 5"  (1.651 m)    Weight Status:   Wt Readings from Last 1 Encounters:  05/28/12 166 lb 12.8 oz (75.66 kg)    Re-estimated needs:  Kcal: 1660-1930 Protein: 76-91g Fluid: >1.7L/day  Skin: Bilateral abdominal incision, colostomy, +1 generalized edema  Diet Order: General   Intake/Output Summary (Last 24 hours) at 05/31/12 1038 Last data filed at 05/31/12 0700  Gross per 24 hour   Intake    940 ml  Output   3080 ml  Net  -2140 ml    Last BM: 2/13   Labs:   Recent Labs Lab 05/27/12 0530 05/28/12 0420 05/29/12 0440 05/30/12 0530 05/31/12 0615  NA 130* 129* 131* 130* 130*  K 3.0* 3.1* 3.3* 3.5 3.7  CL 91* 90* 93* 95* 96  CO2 33* 32 29 27 25   BUN 11 12 12 12 12   CREATININE 0.55 0.56 0.57 0.51 0.55  CALCIUM 7.8* 8.0* 8.3* 8.2* 8.3*  MG 1.2* 1.2* 1.4* 1.5 1.5  PHOS 2.9 2.9  --   --  3.1  GLUCOSE 139* 133* 174* 130* 119*    CBG (last 3)   Recent Labs  05/30/12 2157 05/31/12 0031 05/31/12 0725  GLUCAP 129* 110* 121*    Scheduled Meds: . antiseptic oral rinse  15 mL Mouth Rinse BID  . bisoprolol  5 mg Oral Daily  . celecoxib  200 mg Oral Daily  . enoxaparin (LOVENOX) injection  40 mg Subcutaneous Q24H  . feeding supplement  237 mL Oral TID WC  . insulin aspart  0-9 Units Subcutaneous Q8H  . multivitamins with iron  1 tablet Oral Daily  . pantoprazole (PROTONIX) IV  40 mg Intravenous QHS    Continuous Infusions: . dextrose 5% lactated ringers 10 mL/hr (05/29/12 1719)  . fat emulsion 240 mL (05/30/12 1727)  . liver oil-zinc oxide Stopped (05/22/12 1515)  . TPN (CLINIMIX) +/- additives  60 mL/hr at 05/30/12 [address] MS, RD, [place] [phone] Pager [phone] After Hours Pager

## 2012-05-31 NOTE — Progress Notes (Signed)
Provided Pt with bed offers.  Answered questions.  Pt stating that SNF is her last option, as she prefers CIR or HHPT.  Pt stating that she may even consider private pay.  She stated that she will weigh all her options.    Pt aware that CSW will be meeting with her again to discuss d/c plans further.  CSW thanked Pt for her time.  Providence Crosby, LCSWA Clinical Social Work [phone]

## 2012-05-31 NOTE — Progress Notes (Signed)
Pt's present ostomy appliance is leaking.  Consulted with [facility] and applied recommended ostomy appliance (#16109) using ostomy barrier powder.  Noted that pt's skin is red & irritated around the ostomy appliance where stool has leaked onto the skin from ileostomy.  Skin cleansed using skin cleanser, conditioner, & protector cream. Will continue to assess ostomy appliance for any leakage.  Maeola Harman

## 2012-06-01 LAB — GLUCOSE, CAPILLARY
Glucose-Capillary: 101 mg/dL — ABNORMAL HIGH (ref 70–99)
Glucose-Capillary: 90 mg/dL (ref 70–99)

## 2012-06-01 LAB — MAGNESIUM: Magnesium: 1.3 mg/dL — ABNORMAL LOW (ref 1.5–2.5)

## 2012-06-01 LAB — BASIC METABOLIC PANEL
CO2: 25 mEq/L (ref 19–32)
Chloride: 96 mEq/L (ref 96–112)
Creatinine, Ser: 0.59 mg/dL (ref 0.50–1.10)
Glucose, Bld: 103 mg/dL — ABNORMAL HIGH (ref 70–99)

## 2012-06-01 LAB — CBC
HCT: 24.7 % — ABNORMAL LOW (ref 36.0–46.0)
Hemoglobin: 8.3 g/dL — ABNORMAL LOW (ref 12.0–15.0)
MCHC: 33.6 g/dL (ref 30.0–36.0)
MCV: 95.7 fL (ref 78.0–100.0)
WBC: 8 10*3/uL (ref 4.0–10.5)

## 2012-06-01 LAB — CULTURE, RESPIRATORY W GRAM STAIN

## 2012-06-01 MED ORDER — CEFTAZIDIME 2 G IJ SOLR: 2.0000 g | Freq: Three times a day (TID) | INTRAMUSCULAR | Status: DC

## 2012-06-01 MED ORDER — DIPHENHYDRAMINE HCL 25 MG PO CAPS
25.0000 mg | ORAL_CAPSULE | Freq: Four times a day (QID) | ORAL | Status: DC | PRN
  Administered 2012-06-01: 25 mg via ORAL
  Filled 2012-06-01: qty 1

## 2012-06-01 MED ORDER — SALINE SPRAY 0.65 % NA SOLN
1.0000 | NASAL | Status: DC | PRN
  Administered 2012-06-01: 1 via NASAL
  Filled 2012-06-01: qty 44

## 2012-06-01 MED ORDER — SODIUM CHLORIDE 0.9 % IJ SOLN
10.0000 mL | INTRAMUSCULAR | Status: DC | PRN
  Administered 2012-06-01 – 2012-06-03 (×5): 10 mL

## 2012-06-01 MED ORDER — MAGNESIUM GLUCONATE 500 MG PO TABS
500.0000 mg | ORAL_TABLET | Freq: Every day | ORAL | Status: DC
  Administered 2012-06-01 – 2012-06-02 (×2): 500 mg via ORAL
  Filled 2012-06-01 (×3): qty 1

## 2012-06-01 MED ORDER — MAGNESIUM SULFATE 40 MG/ML IJ SOLN
4.0000 g | Freq: Once | INTRAMUSCULAR | Status: AC
  Administered 2012-06-01: 4 g via INTRAVENOUS
  Filled 2012-06-01: qty 100

## 2012-06-01 MED ORDER — DEXTROSE 5 % IV SOLN
2.0000 g | Freq: Three times a day (TID) | INTRAVENOUS | Status: DC
  Administered 2012-06-01 – 2012-06-06 (×16): 2 g via INTRAVENOUS
  Filled 2012-06-01 (×17): qty 2

## 2012-06-01 NOTE — Progress Notes (Signed)
CSW attempted to meet with patient but she was not feeling well. CSW left messages for patient's sister, Corrie Dandy (cell#: [phone]) to give bed offers and discuss discharge planning. CSW reviewed note covering CSW's note from yesterday stating that patient requesting CIR vs. Home health. Per CIR Dr. Ranae Plumber note 2/12 - they are recommending SNF vs. HH. CSW will follow-up with patient/sister re: discharge plan & SNF bed offers.   Unice Bailey, LCSW [facility] Clinical Social Worker cell #: [phone]

## 2012-06-01 NOTE — Progress Notes (Signed)
Patient complained of itching---MD called--order received-- and verified. Tama Gander, RN

## 2012-06-01 NOTE — Progress Notes (Signed)
Nutrition Brief Note  Hang calorie count envelope on the patient's door. Document percent consumed for each item on the patient's meal tray ticket and keep in envelope. Also document percent of any supplement or snack pt consumes and keep documentation in envelope for RD to review.   Pt and nurse made aware of calorie count.   Ian Malkin RD, LDN Inpatient Clinical Dietitian Pager: [phone] After Hours Pager: [phone]

## 2012-06-01 NOTE — Progress Notes (Signed)
10 Days Post-Op  Subjective: Eating about 25% of her meals by her report.  No further ileostomy problems.  She is thinking about a SNF.  Dilaudid is causing her to itch.  Still coughing some.    Objective: Vital signs in last 24 hours: Temp:  [97.4 F (36.3 C)-99 F (37.2 C)] 97.4 F (36.3 C) (02/14 0518) Pulse Rate:  [82-88] 82 (02/14 0518) Resp:  [16] 16 (02/14 0518) BP: (143)/(73) 143/73 mmHg (02/14 0518) SpO2:  [93 %] 93 % (02/14 0518) Last BM Date: 05/31/12  Intake/Output from previous day: 02/13 0701 - 02/14 0700 In: 1590.7 [I.V.:360; TPN:1230.7] Out: 2875 [Urine:2300; Stool:575] Intake/Output this shift:    PE: General- In NAD Abdomen-soft, loop ileostomy pink with green liquid output with some leaking inferiorly, midline wound open, clean,some fascial dehiscence, underlying tissue is stuck, LLQ open wound is clean, serous drain output-removed.  Lab Results:   Recent Labs  05/30/12 0530 06/01/12 0455  WBC 8.3 8.0  HGB 8.5* 8.3*  HCT 25.0* 24.7*  PLT 330 348   BMET  Recent Labs  05/31/12 0615 06/01/12 0455  NA 130* 130*  K 3.7 3.8  CL 96 96  CO2 25 25  GLUCOSE 119* 103*  BUN 12 13  CREATININE 0.55 0.59  CALCIUM 8.3* 8.1*   PT/INR No results found for this basename: LABPROT, INR,  in the last 72 hours Comprehensive Metabolic Panel:    Component Value Date/Time   NA 130* 06/01/2012 0455   NA 130* 02/09/2012 1321   K 3.8 06/01/2012 0455   K 4.6 02/09/2012 1321   CL 96 06/01/2012 0455   CL 101 02/09/2012 1321   CO2 25 06/01/2012 0455   CO2 18* 02/09/2012 1321   BUN 13 06/01/2012 0455   BUN 21.0 02/09/2012 1321   CREATININE 0.59 06/01/2012 0455   CREATININE 0.8 02/09/2012 1321   GLUCOSE 103* 06/01/2012 0455   GLUCOSE 79 02/09/2012 1321   CALCIUM 8.1* 06/01/2012 0455   CALCIUM 9.2 02/09/2012 1321   AST 30 05/31/2012 0615   AST 53* 02/09/2012 1321   ALT 23 05/31/2012 0615   ALT 56* 02/09/2012 1321   ALKPHOS 456* 05/31/2012 0615   ALKPHOS 712*  02/09/2012 1321   BILITOT 0.3 05/31/2012 0615   BILITOT 0.50 02/09/2012 1321   PROT 5.8* 05/31/2012 0615   PROT 7.0 02/09/2012 1321   ALBUMIN 2.2* 05/31/2012 0615   ALBUMIN 3.8 02/09/2012 1321     Studies/Results: No results found.  Anti-infectives: Anti-infectives   Start     Dose/Rate Route Frequency Ordered Stop   05/22/12 0800  ertapenem (INVANZ) 1 g in sodium chloride 0.9 % 50 mL IVPB  Status:  Discontinued     1 g 100 mL/hr over 30 Minutes Intravenous Every 24 hours 05/22/12 0733 05/30/12 0842   05/17/12 2000  cefoTEtan (CEFOTAN) 2 g in dextrose 5 % 50 mL IVPB     2 g 100 mL/hr over 30 Minutes Intravenous Every 12 hours 05/17/12 1312 05/17/12 2114   05/17/12 0606  cefOXitin (MEFOXIN) 2 g in dextrose 5 % 50 mL IVPB     2 g 100 mL/hr over 30 Minutes Intravenous On call to O.R. 05/17/12 0606 05/17/12 0751      Assessment Active Problems: 1.  Chronic ischemic colitis s/p reversal of colostomy, partial colectomy, takedown of mucous fistula 05/17/12  2.  Postop SBO, microleak from anastomosis and abscess s/p expl lap, loa, abscess drainage, oversewing of leak, loop ileostomy 05/22/12-some midline fascia dehiscence,  she is in no shape physically or nutritionally to go back to OR. 3.  Afib-SR on preop Beta blocker 4.  Chronic anemia with ABL- hemoglobin stable 5.  Chronic hyponatremia 6.  CMML (chronic myelomonocytic leukemia) 7.  Hypomagnesemia-Mg low at 1.3. 8.  PC malnutrition-on TPN; eating a little of the regular diet. 9.  Hypokalemia-K+ level 3.8 10.  Severe deconditioned state-not a candidate for CIR; SNF recommended and she is contemplating this. 11.  Confusion-improving. 12.  Pseudomonas pneumonia-sensitivities reviewed     LOS: 15 days   Plan:   Stop TPN.  Calorie counts. Start IV Ceftazidime for pneumonia.  Magnesium supplement. Karalyn Kadel J 06/01/2012

## 2012-06-01 NOTE — Progress Notes (Signed)
PT Cancellation Note  Patient Details Name: [name] MRN: [id number] DOB: 1951/03/05   Cancelled Treatment:    Reason Eval/Treat Not Completed: Medical issues which prohibited therapy;Other (comment) (pt feels like she is spiking a temp, defers PT today)   Donnetta Hail 06/01/2012, 3:50 PM

## 2012-06-01 NOTE — Progress Notes (Signed)
Skin red & irritated around ileostomy appliance from previous leakage from ileostomy.  Skin cleanser and protectant applied. [name]

## 2012-06-01 NOTE — Consult Note (Signed)
WOC ostomy follow-up consult  Stoma type/location: RUQ ileostomy.  Bedside nurse applied new pouch on 2/13 after rod was removed.  Current pouch is intact with good seal.    Stomal assessment/size: Stoma visualized through intact pouch, red and viable. Output  Emptied 200cc dark green liquid stool.  Ostomy pouching: 1pc convex.  Education provided: Pt aware of need to request assistance with emptying when half full to avoid leakage occurrence. Extra convex pouches at bedside for staff use.  Pt states she is familiar with pouch application and emptying from previous stoma experience prior to this hospitalization.  She is independent with opening and closing velcro when at home and denies further questions at this time.  Cammie Mcgee, RN, MSN, [employer]  [phone]

## 2012-06-01 NOTE — Progress Notes (Signed)
  Pharmacy Note (Brief) - D/C TNA  Per Dr. Maris Berger note today, plan is to stop TNA.   1) At 18:00 tonight, take down and discard TNA bag. 2) Further SSI and IVF changes per MD - will leave current orders active. 3) D/C TNA labs. 4) Pharmacy will sign off.  Darrol Angel, PharmD Pager: [phone] 06/01/2012 11:04 AM

## 2012-06-02 LAB — GLUCOSE, CAPILLARY
Glucose-Capillary: 98 mg/dL (ref 70–99)
Glucose-Capillary: 99 mg/dL (ref 70–99)

## 2012-06-02 LAB — MAGNESIUM: Magnesium: 1.7 mg/dL (ref 1.5–2.5)

## 2012-06-02 LAB — BASIC METABOLIC PANEL
Calcium: 8.1 mg/dL — ABNORMAL LOW (ref 8.4–10.5)
GFR calc Af Amer: >90 mL/min (ref >90)
GFR calc non Af Amer: >90 mL/min (ref >90)
Sodium: 131 mEq/L — ABNORMAL LOW (ref 135–145)

## 2012-06-02 MED ORDER — PANTOPRAZOLE SODIUM 40 MG PO TBEC
40.0000 mg | DELAYED_RELEASE_TABLET | Freq: Every day | ORAL | Status: DC
  Administered 2012-06-03 – 2012-06-06 (×4): 40 mg via ORAL
  Filled 2012-06-02 (×4): qty 1

## 2012-06-02 MED ORDER — VITAMINS A & D EX OINT
TOPICAL_OINTMENT | CUTANEOUS | Status: AC
  Administered 2012-06-02: 12:00:00
  Filled 2012-06-02: qty 5

## 2012-06-02 NOTE — Progress Notes (Signed)
11 Days Post-Op  Subjective: Trying to eat more but gets full fast. Producutive cough.  Pain controlled   Objective: Vital signs in last 24 hours: Temp:  [98.3 F (36.8 C)-99.5 F (37.5 C)] 98.6 F (37 C) (02/15 0510) Pulse Rate:  [87-99] 90 (02/15 0510) Resp:  [16-18] 16 (02/15 0510) BP: (114-122)/(63-75) 119/67 mmHg (02/15 0510) SpO2:  [94 %-97 %] 97 % (02/15 0510) Last BM Date: 06/02/12 (ileostomy)  Intake/Output from previous day: 02/14 0701 - 02/15 0700 In: 1314 [P.O.:360; I.V.:239.3; IV Piggyback:250; TPN:464.7] Out: 2725 [Urine:1900; Stool:825] Intake/Output this shift: Total I/O In: -  Out: 230 [Stool:230]  PE: General- In NAD Abdomen-soft, loop ileostomy pink with green liquid output with some leaking inferiorly, midline wound open, clean,some fascial dehiscence, underlying tissue is stuck, LLQ open wound is clean, serous drain output-removed.  Lab Results:   Recent Labs  06/01/12 0455  WBC 8.0  HGB 8.3*  HCT 24.7*  PLT 348   BMET  Recent Labs  06/01/12 0455 06/02/12 0445  NA 130* 131*  K 3.8 3.6  CL 96 97  CO2 25 24  GLUCOSE 103* 99  BUN 13 11  CREATININE 0.59 0.65  CALCIUM 8.1* 8.1*   PT/INR No results found for this basename: LABPROT, INR,  in the last 72 hours Comprehensive Metabolic Panel:    Component Value Date/Time   NA 131* 06/02/2012 0445   NA 130* 02/09/2012 1321   K 3.6 06/02/2012 0445   K 4.6 02/09/2012 1321   CL 97 06/02/2012 0445   CL 101 02/09/2012 1321   CO2 24 06/02/2012 0445   CO2 18* 02/09/2012 1321   BUN 11 06/02/2012 0445   BUN 21.0 02/09/2012 1321   CREATININE 0.65 06/02/2012 0445   CREATININE 0.8 02/09/2012 1321   GLUCOSE 99 06/02/2012 0445   GLUCOSE 79 02/09/2012 1321   CALCIUM 8.1* 06/02/2012 0445   CALCIUM 9.2 02/09/2012 1321   AST 30 05/31/2012 0615   AST 53* 02/09/2012 1321   ALT 23 05/31/2012 0615   ALT 56* 02/09/2012 1321   ALKPHOS 456* 05/31/2012 0615   ALKPHOS 712* 02/09/2012 1321   BILITOT 0.3 05/31/2012  0615   BILITOT 0.50 02/09/2012 1321   PROT 5.8* 05/31/2012 0615   PROT 7.0 02/09/2012 1321   ALBUMIN 2.2* 05/31/2012 0615   ALBUMIN 3.8 02/09/2012 1321     Studies/Results: No results found.  Anti-infectives: Anti-infectives   Start     Dose/Rate Route Frequency Ordered Stop   06/01/12 1000  cefTAZidime (FORTAZ) 2 g in dextrose 5 % 50 mL IVPB     2 g 100 mL/hr over 30 Minutes Intravenous Every 8 hours 06/01/12 0945     06/01/12 0930  cefTAZidime (FORTAZ) injection 2 g  Status:  Discontinued     2 g Intramuscular 3 times per day 06/01/12 0925 06/01/12 0945   05/22/12 0800  ertapenem (INVANZ) 1 g in sodium chloride 0.9 % 50 mL IVPB  Status:  Discontinued     1 g 100 mL/hr over 30 Minutes Intravenous Every 24 hours 05/22/12 0733 05/30/12 0842   05/17/12 2000  cefoTEtan (CEFOTAN) 2 g in dextrose 5 % 50 mL IVPB     2 g 100 mL/hr over 30 Minutes Intravenous Every 12 hours 05/17/12 1312 05/17/12 2114   05/17/12 0606  cefOXitin (MEFOXIN) 2 g in dextrose 5 % 50 mL IVPB     2 g 100 mL/hr over 30 Minutes Intravenous On call to O.R. 05/17/12 4540 05/17/12 9811  Assessment Active Problems: 1.  Chronic ischemic colitis s/p reversal of colostomy, partial colectomy, takedown of mucous fistula 05/17/12  2.  Postop SBO, microleak from anastomosis and abscess s/p expl lap, loa, abscess drainage, oversewing of leak, loop ileostomy 05/22/12-some midline fascia dehiscence, she is in no shape physically or nutritionally to go back to OR. 3.  Afib-SR on preop Beta blocker 4.  Chronic anemia with ABL- hemoglobin stable 5.  Chronic hyponatremia 6.  CMML (chronic myelomonocytic leukemia) 7.  Hypomagnesemia-Mg better today at 1.7 8.  PC malnutrition- off TPN; eating a little of the regular diet. 9.  Hypokalemia-K+ level 3.6 today 10.  Severe deconditioned state-not a candidate for CIR; SNF recommended and she is contemplating this.. 11.  Pseudomonas pneumonia- on ceftaz- wbc decreasing     LOS: 16  days   Plan:   D/c foley today Cont calorie counts   Humphrey Guerreiro C. 06/02/2012

## 2012-06-02 NOTE — Progress Notes (Signed)
PHARMACIST - PHYSICIAN COMMUNICATION CONCERNING:  IV to Oral Route Change Policy  RECOMMENDATION: This patient is receiving Protonix by the intravenous route.  Based on criteria approved by the Pharmacy and Therapeutics Committee,Protonix is being converted to the equivalent oral dose form(s).  DESCRIPTION: These criteria include:  Has a documented ability to take oral medications (tolerating diet of full liquids or better or  Gastric tube feedings for >24 hours OR taking other scheduled oral medications for >24 hours).  Expected plan for continued treatment for at least 1 day.  If you have questions about this conversion, please contact the Pharmacy Department  []   [phone] )  [facility] []   [phone] )  [facility]  []   [phone] )  [facility] [x]   [phone] )  [facility]   Geoffry Paradise, PharmD, [place] Pager: [phone] 12:57 PM Pharmacy #: [id number]

## 2012-06-02 NOTE — Progress Notes (Signed)
Poor appetite, doesn't like Ensure, took sips of one this AM, ate a chocolate donut for lunch with a diet coke

## 2012-06-02 NOTE — Progress Notes (Signed)
CSW met with the Pt at the bedside to provide SNF listing and inform Pt that the CIR was not an available option at this time.   Pt stated that she is not agreeable with SNF and that she would prefer to go home.   CSW spoke with the sister about CIR decision and she would like to speak with the MD or PT concerning recommendations. Sister and Pt do not feel she would be appropriate for SNF and would like CIR to be reconsidered.   At this time the Pt and Sister do not want to consider SNF, however CSW did provide SNF B/O's in the event they were to change their minds.   CSW to follow for d/c planning.   Leron Croak, LCSWA [employer] [phone]

## 2012-06-03 LAB — BASIC METABOLIC PANEL
BUN: 11 mg/dL (ref 6–23)
Creatinine, Ser: 0.64 mg/dL (ref 0.50–1.10)
GFR calc Af Amer: >90 mL/min (ref >90)
GFR calc non Af Amer: >90 mL/min (ref >90)
Glucose, Bld: 96 mg/dL (ref 70–99)

## 2012-06-03 LAB — CBC
MCH: 32.5 pg (ref 26.0–34.0)
MCHC: 34.1 g/dL (ref 30.0–36.0)
MCV: 95.1 fL (ref 78.0–100.0)
Platelets: 355 10*3/uL (ref 150–400)
RBC: 3.08 MIL/uL — ABNORMAL LOW (ref 3.87–5.11)

## 2012-06-03 LAB — GLUCOSE, CAPILLARY
Glucose-Capillary: 57 mg/dL — ABNORMAL LOW (ref 70–99)
Glucose-Capillary: 64 mg/dL — ABNORMAL LOW (ref 70–99)
Glucose-Capillary: 73 mg/dL (ref 70–99)
Glucose-Capillary: 95 mg/dL (ref 70–99)

## 2012-06-03 MED ORDER — POTASSIUM CHLORIDE CRYS ER 20 MEQ PO TBCR
40.0000 meq | EXTENDED_RELEASE_TABLET | Freq: Once | ORAL | Status: AC
  Administered 2012-06-03: 40 meq via ORAL
  Filled 2012-06-03: qty 2

## 2012-06-03 MED ORDER — ENSURE PUDDING PO PUDG
1.0000 | Freq: Three times a day (TID) | ORAL | Status: DC
  Administered 2012-06-03 – 2012-06-04 (×3): 1 via ORAL
  Filled 2012-06-03 (×6): qty 1

## 2012-06-03 MED ORDER — MAGNESIUM GLUCONATE 500 MG PO TABS
500.0000 mg | ORAL_TABLET | Freq: Two times a day (BID) | ORAL | Status: DC
  Administered 2012-06-03 – 2012-06-04 (×2): 500 mg via ORAL
  Filled 2012-06-03 (×3): qty 1

## 2012-06-03 NOTE — Progress Notes (Addendum)
Patient had an episode of trigmeny. Check on patient who was sleeping.  Will continue to monitor. Per patient hx of afib, aflutter.  Existing strips on chart show ST, PAC, PVC.  Checked vitals signs.

## 2012-06-03 NOTE — Progress Notes (Signed)
Hypoglycemic Event  CBG: 57  Treatment: 15 GM carbohydrate snack  Symptoms: None  Follow-up CBG: Time: 0752 CBG Result:64  Possible Reasons for Event: Unknown  Comments/MD notified: patient had a donut prior to 0752 check.   Patient consumed 120cc of apple juice.  Patient having an additonal snack, peanut butter and graham crackers. Will continue to monitor    Kristain Hu M  Remember to initiate Hypoglycemia Order Set & complete

## 2012-06-03 NOTE — Progress Notes (Signed)
12 Days Post-Op  Subjective: Trying to eat more but gets full fast. Producutive cough.  Pain controlled   Objective: Vital signs in last 24 hours: Temp:  [97.9 F (36.6 C)-98.2 F (36.8 C)] 98.2 F (36.8 C) (02/16 0446) Pulse Rate:  [80-117] 114 (02/16 0930) Resp:  [16-18] 18 (02/16 0930) BP: (104-114)/(62-75) 105/70 mmHg (02/16 0930) SpO2:  [92 %-99 %] 92 % (02/16 0446) Last BM Date: 06/03/12  Intake/Output from previous day: 02/15 0701 - 02/16 0700 In: 1040.7 [P.O.:240; I.V.:700.7; IV Piggyback:100] Out: 3270 [Urine:2240; Stool:1030] Intake/Output this shift: Total I/O In: 10 [I.V.:10] Out: -   PE: General- In NAD Abdomen-soft, loop ileostomy pink with green liquid output with some leaking inferiorly, midline wound open, clean,some fascial dehiscence, underlying tissue is stuck, LLQ open wound is clean, serous drain output-removed.  Lab Results:   Recent Labs  06/01/12 0455 06/03/12 0845  WBC 8.0 7.9  HGB 8.3* 10.0*  HCT 24.7* 29.3*  PLT 348 355   BMET  Recent Labs  06/02/12 0445 06/03/12 0500  NA 131* 132*  K 3.6 3.3*  CL 97 98  CO2 24 24  GLUCOSE 99 96  BUN 11 11  CREATININE 0.65 0.64  CALCIUM 8.1* 8.1*   PT/INR No results found for this basename: LABPROT, INR,  in the last 72 hours Comprehensive Metabolic Panel:    Component Value Date/Time   NA 132* 06/03/2012 0500   NA 130* 02/09/2012 1321   K 3.3* 06/03/2012 0500   K 4.6 02/09/2012 1321   CL 98 06/03/2012 0500   CL 101 02/09/2012 1321   CO2 24 06/03/2012 0500   CO2 18* 02/09/2012 1321   BUN 11 06/03/2012 0500   BUN 21.0 02/09/2012 1321   CREATININE 0.64 06/03/2012 0500   CREATININE 0.8 02/09/2012 1321   GLUCOSE 96 06/03/2012 0500   GLUCOSE 79 02/09/2012 1321   CALCIUM 8.1* 06/03/2012 0500   CALCIUM 9.2 02/09/2012 1321   AST 30 05/31/2012 0615   AST 53* 02/09/2012 1321   ALT 23 05/31/2012 0615   ALT 56* 02/09/2012 1321   ALKPHOS 456* 05/31/2012 0615   ALKPHOS 712* 02/09/2012 1321   BILITOT 0.3 05/31/2012 0615   BILITOT 0.50 02/09/2012 1321   PROT 5.8* 05/31/2012 0615   PROT 7.0 02/09/2012 1321   ALBUMIN 2.2* 05/31/2012 0615   ALBUMIN 3.8 02/09/2012 1321     Studies/Results: No results found.  Anti-infectives: Anti-infectives   Start     Dose/Rate Route Frequency Ordered Stop   06/01/12 1000  cefTAZidime (FORTAZ) 2 g in dextrose 5 % 50 mL IVPB     2 g 100 mL/hr over 30 Minutes Intravenous Every 8 hours 06/01/12 0945     06/01/12 0930  cefTAZidime (FORTAZ) injection 2 g  Status:  Discontinued     2 g Intramuscular 3 times per day 06/01/12 0925 06/01/12 0945   05/22/12 0800  ertapenem (INVANZ) 1 g in sodium chloride 0.9 % 50 mL IVPB  Status:  Discontinued     1 g 100 mL/hr over 30 Minutes Intravenous Every 24 hours 05/22/12 0733 05/30/12 0842   05/17/12 2000  cefoTEtan (CEFOTAN) 2 g in dextrose 5 % 50 mL IVPB     2 g 100 mL/hr over 30 Minutes Intravenous Every 12 hours 05/17/12 1312 05/17/12 2114   05/17/12 0606  cefOXitin (MEFOXIN) 2 g in dextrose 5 % 50 mL IVPB     2 g 100 mL/hr over 30 Minutes Intravenous On call to O.R. 05/17/12  1610 05/17/12 0751      Assessment Active Problems: 1.  Chronic ischemic colitis s/p reversal of colostomy, partial colectomy, takedown of mucous fistula 05/17/12  2.  Postop SBO, microleak from anastomosis and abscess s/p expl lap, loa, abscess drainage, oversewing of leak, loop ileostomy 05/22/12-some midline fascia dehiscence, she is in no shape physically or nutritionally to go back to OR. 3.  Afib-SR on preop Beta blocker 4.  Chronic anemia with ABL- hemoglobin stable 5.  Chronic hyponatremia 6.  CMML (chronic myelomonocytic leukemia) 7.  Hypomagnesemia- will replace as needed 8.  PC malnutrition- off TPN; eating a little of the regular diet. 9.  Hypokalemia- replace as needed 10.  Severe deconditioned state-not a candidate for CIR; SNF recommended but pt does not want to do this.  Will have PT re-eval tom  11.  Pseudomonas  pneumonia- on ceftaz- wbc wnl     LOS: 17 days  Pt eating some and feeling better    Plan:  Replace lytes today Nutrition to eval calorie counts, will try ensure pudding   Charleston Hankin C. 06/03/2012

## 2012-06-03 NOTE — Progress Notes (Signed)
Patient ambulated in the hall two two times with stand by assist.

## 2012-06-04 LAB — BASIC METABOLIC PANEL WITH GFR
BUN: 7 mg/dL (ref 6–23)
CO2: 23 meq/L (ref 19–32)
Calcium: 8 mg/dL — ABNORMAL LOW (ref 8.4–10.5)
Chloride: 100 meq/L (ref 96–112)
Creatinine, Ser: 0.64 mg/dL (ref 0.50–1.10)
GFR calc Af Amer: >90 mL/min
GFR calc non Af Amer: >90 mL/min
Glucose, Bld: 100 mg/dL — ABNORMAL HIGH (ref 70–99)
Potassium: 3.3 meq/L — ABNORMAL LOW (ref 3.5–5.1)
Sodium: 135 meq/L (ref 135–145)

## 2012-06-04 LAB — GLUCOSE, CAPILLARY: Glucose-Capillary: 84 mg/dL (ref 70–99)

## 2012-06-04 LAB — MAGNESIUM: Magnesium: 1.3 mg/dL — ABNORMAL LOW (ref 1.5–2.5)

## 2012-06-04 LAB — PHOSPHORUS: Phosphorus: 2.4 mg/dL (ref 2.3–4.6)

## 2012-06-04 MED ORDER — GLUCERNA SHAKE PO LIQD
237.0000 mL | Freq: Three times a day (TID) | ORAL | Status: DC
  Administered 2012-06-06: 237 mL via ORAL
  Filled 2012-06-04 (×7): qty 237

## 2012-06-04 MED ORDER — POTASSIUM CHLORIDE CRYS ER 20 MEQ PO TBCR
40.0000 meq | EXTENDED_RELEASE_TABLET | Freq: Every day | ORAL | Status: DC
  Administered 2012-06-04 – 2012-06-06 (×3): 40 meq via ORAL
  Filled 2012-06-04 (×3): qty 2

## 2012-06-04 MED ORDER — MAGNESIUM OXIDE 400 (241.3 MG) MG PO TABS
800.0000 mg | ORAL_TABLET | Freq: Every day | ORAL | Status: DC
  Administered 2012-06-04 – 2012-06-06 (×3): 800 mg via ORAL
  Filled 2012-06-04 (×3): qty 2

## 2012-06-04 NOTE — Progress Notes (Signed)
13 Days Post-Op  Subjective: Trying to eat more but gets full fast.  Pain controlled.  Ambulated in hall yesterday.  Coughing less.    Objective: Vital signs in last 24 hours: Temp:  [97.4 F (36.3 C)-98.1 F (36.7 C)] 98.1 F (36.7 C) (02/17 0500) Pulse Rate:  [81-114] 111 (02/17 0500) Resp:  [18-20] 20 (02/17 0500) BP: (105-132)/(63-89) 132/89 mmHg (02/17 0500) SpO2:  [94 %] 94 % (02/17 0500) Last BM Date: 06/03/12  Intake/Output from previous day: 02/16 0701 - 02/17 0700 In: 784 [P.O.:150; I.V.:450; IV Piggyback:184] Out: 1500 [Urine:1150; Stool:350] Intake/Output this shift:    PE: General- In NAD Abdomen-soft, loop ileostomy pink with green liquid output with some leaking inferiorly, midline wound open, clean,some fascial dehiscence especially superiorly, underlying tissue is fixed, LLQ open wound is clean,   Lab Results:   Recent Labs  06/03/12 0845  WBC 7.9  HGB 10.0*  HCT 29.3*  PLT 355   BMET  Recent Labs  06/02/12 0445 06/03/12 0500  NA 131* 132*  K 3.6 3.3*  CL 97 98  CO2 24 24  GLUCOSE 99 96  BUN 11 11  CREATININE 0.65 0.64  CALCIUM 8.1* 8.1*   PT/INR No results found for this basename: LABPROT, INR,  in the last 72 hours Comprehensive Metabolic Panel:    Component Value Date/Time   NA 132* 06/03/2012 0500   NA 130* 02/09/2012 1321   K 3.3* 06/03/2012 0500   K 4.6 02/09/2012 1321   CL 98 06/03/2012 0500   CL 101 02/09/2012 1321   CO2 24 06/03/2012 0500   CO2 18* 02/09/2012 1321   BUN 11 06/03/2012 0500   BUN 21.0 02/09/2012 1321   CREATININE 0.64 06/03/2012 0500   CREATININE 0.8 02/09/2012 1321   GLUCOSE 96 06/03/2012 0500   GLUCOSE 79 02/09/2012 1321   CALCIUM 8.1* 06/03/2012 0500   CALCIUM 9.2 02/09/2012 1321   AST 30 05/31/2012 0615   AST 53* 02/09/2012 1321   ALT 23 05/31/2012 0615   ALT 56* 02/09/2012 1321   ALKPHOS 456* 05/31/2012 0615   ALKPHOS 712* 02/09/2012 1321   BILITOT 0.3 05/31/2012 0615   BILITOT 0.50 02/09/2012 1321   PROT 5.8* 05/31/2012 0615   PROT 7.0 02/09/2012 1321   ALBUMIN 2.2* 05/31/2012 0615   ALBUMIN 3.8 02/09/2012 1321     Studies/Results: No results found.  Anti-infectives: Anti-infectives   Start     Dose/Rate Route Frequency Ordered Stop   06/01/12 1000  cefTAZidime (FORTAZ) 2 g in dextrose 5 % 50 mL IVPB     2 g 100 mL/hr over 30 Minutes Intravenous Every 8 hours 06/01/12 0945     06/01/12 0930  cefTAZidime (FORTAZ) injection 2 g  Status:  Discontinued     2 g Intramuscular 3 times per day 06/01/12 0925 06/01/12 0945   05/22/12 0800  ertapenem (INVANZ) 1 g in sodium chloride 0.9 % 50 mL IVPB  Status:  Discontinued     1 g 100 mL/hr over 30 Minutes Intravenous Every 24 hours 05/22/12 0733 05/30/12 0842   05/17/12 2000  cefoTEtan (CEFOTAN) 2 g in dextrose 5 % 50 mL IVPB     2 g 100 mL/hr over 30 Minutes Intravenous Every 12 hours 05/17/12 1312 05/17/12 2114   05/17/12 0606  cefOXitin (MEFOXIN) 2 g in dextrose 5 % 50 mL IVPB     2 g 100 mL/hr over 30 Minutes Intravenous On call to O.R. 05/17/12 4540 05/17/12 9811  Assessment Active Problems: 1.  Chronic ischemic colitis s/p reversal of colostomy, partial colectomy, takedown of mucous fistula 05/17/12  2.  Postop SBO, microleak from anastomosis and abscess s/p expl lap, loa, abscess drainage, oversewing of leak, loop ileostomy 05/22/12-some midline fascia dehiscence, she is in no shape physically or nutritionally to go back to OR. 3.  Afib-SR on preop Beta blocker 4.  Chronic anemia with ABL- hemoglobin stable 5.  Chronic hyponatremia 6.  CMML (chronic myelomonocytic leukemia) 7.  Hypomagnesemia- will replace as needed 8.  PC malnutrition- off TPN; eating a little of the regular diet. 9.  Hypokalemia- replace as needed 10.  Severe deconditioned state-not a candidate for CIR; SNF recommended but pt does not want to do this.  Will have PT re-eval tom  11.  Pseudomonas pneumonia- on ceftaz- wbc wnl     LOS: 18 days  Pt eating  some and feeling better    Plan:  Recheck lytes today Nutrition to eval calorie counts PT to re-eval for placement today   Jaylenn Altier C. 06/04/2012

## 2012-06-04 NOTE — Progress Notes (Signed)
Physical Therapy Treatment Patient Details Name: [name] MRN: [id number] DOB: 11/09/1950 Today's Date: 06/04/2012 Time: 4098-1191 PT Time Calculation (min): 23 min  PT Assessment / Plan / Recommendation Comments on Treatment Session  Pt much less confused, however has decreased awareness for safety during session.   Pt stumbled in hallway with therapist assist to correct and prevent fall.  Pt felt that she was not going to fall and does not need RW.  Highly recommend that pt use RW.  Pt states she feels she would have enough assist at home, therefore recommend HHPT.     Follow Up Recommendations  Home health PT;Supervision/Assistance - 24 hour     Does the patient have the potential to tolerate intense rehabilitation     Barriers to Discharge        Equipment Recommendations  None recommended by PT    Recommendations for Other Services    Frequency Min 3X/week   Plan Discharge plan needs to be updated    Precautions / Restrictions Precautions Precautions: Fall Precaution Comments: partial colectomy, JP drain from ostomy takedown, monitor O2 Restrictions Weight Bearing Restrictions: No   Pertinent Vitals/Pain Pt states no pain during session.     Mobility  Bed Mobility Bed Mobility: Supine to Sit Supine to Sit: 5: Supervision Details for Bed Mobility Assistance: Supervision for safety.  Transfers Transfers: Sit to Stand;Stand to Sit Sit to Stand: 4: Min guard;With upper extremity assist;With armrests;From chair/3-in-1;From toilet Stand to Sit: 4: Min guard;With upper extremity assist;To chair/3-in-1;To toilet Details for Transfer Assistance: Performed x 2 in order to use regular toilet in restroom.  Cues for hand placement and safety, esp on toilet.  Ambulation/Gait Ambulation/Gait Assistance: 4: Min assist;3: Mod assist Ambulation Distance (Feet): 150 Feet Assistive device: Rolling walker;None (IV pole) Ambulation/Gait Assistance Details: Had pt initially use IV  pole for stability with noted LOB and stumble into therapist.  Pt states she was "reaching for wall" and "wasn't going to fall".  Had pt also use RW with balance much improved and highly recommend she use RW.  Gait Pattern: Step-through pattern;Decreased stride length;Trunk flexed Gait velocity: decreased General Gait Details: needed to take a few rest breaks due to fatigue.     Exercises     PT Diagnosis:    PT Problem List:   PT Treatment Interventions:     PT Goals Acute Rehab PT Goals PT Goal Formulation: With patient Time For Goal Achievement: 06/11/12 Potential to Achieve Goals: Good Pt will go Supine/Side to Sit: with supervision PT Goal: Supine/Side to Sit - Progress: Met Pt will go Sit to Stand: with supervision PT Goal: Sit to Stand - Progress: Progressing toward goal Pt will go Stand to Sit: with supervision PT Goal: Stand to Sit - Progress: Progressing toward goal Pt will Ambulate: 51 - 150 feet;with supervision;with least restrictive assistive device PT Goal: Ambulate - Progress: Progressing toward goal  Visit Information  Last PT Received On: 06/04/12 Assistance Needed: +1    Subjective Data  Subjective: I knew you guys were coming.  Patient Stated Goal: to return home.    Cognition  Cognition Overall Cognitive Status: Impaired Area of Impairment: Safety/judgement;Awareness of errors Arousal/Alertness: Awake/alert Orientation Level: Appears intact for tasks assessed Behavior During Session: [facility] for tasks performed Safety/Judgement: Decreased awareness of safety precautions Safety/Judgement - Other Comments: Pt mostly cognitively intact, however "stumbled" in hall and felt no need for AD and states she was "just reaching for the wall"  Awareness of Errors: Assistance  required to identify errors made    Balance     End of Session PT - End of Session Activity Tolerance: Patient limited by fatigue Patient left: in chair;with call bell/phone within  reach Nurse Communication: Mobility status   GP     Vista Deck 06/04/2012, 2:57 PM

## 2012-06-04 NOTE — Progress Notes (Signed)
Patient's HR is in the 110s. She is asymptomatic and does not feel like her heart is racing. RN will continue to monitor HR and rhythm.

## 2012-06-04 NOTE — Progress Notes (Signed)
Talked to patient about DCP; patient plans to go home at discharge; [facility] choices given to the patient, patient chose [facility]; Norberta Keens RN with [facility] called for arrangements; Attending MD, please order HHRN/ PT at discharge. Abelino Derrick RN,BSN,MHA

## 2012-06-04 NOTE — Progress Notes (Signed)
Calorie Count Note  48 hour calorie count ordered.  Diet: Regular Supplements: Ensure Complete TID, Ensure Pudding BID  06/02/12 Breakfast: None Lunch: 370 kcal, 3 grams protein Dinner: 200 kcal, 7 grams protein Supplements: 44 kcal, 2 grams Protein Total: 614 kcal, 12 grams protein  06/03/12 Breakfast: None Lunch: 206 kcal, 7 grams protein Dinner: None Supplements/snacks: 238 kcal,  7 grams protein Total: 444 kcal, 14 grams protein  Total intake daily Average: 529 kcal (31% of minimum estimated needs)  13 grams protein (17% of minimum estimated needs)  Nutrition Dx: Inadequate oral intake related to poor appetite and nausea as evidenced by pt meeting 31% of estimated calorie needs and 17% of estimated protein needs.        Goal: Pt to meet >/= 90% of their estimated nutrition needs; NOT met  Discussed Calorie Count results with pt. Pt reports that she didn't order breakfast or dinner 2/16 because she didn't feel hungry, couldn't decide what to order, and gets overwhelmed trying to decide what to order. Pt states she doesn't like Ensure Pudding or sweet supplements. Pt would prefer non-sweet snacks and that trays be automatically sent so that she doesn't have to order.  Intervention:  Change tray order status to not appropriate for room service Cancel Ensure Pudding and provide daily snacks instead Provide Glucerna TID instead of Complete  Will follow-up to see if patient likes changes. Will continue to monitor pt and po intake.  Ian Malkin RD, LDN Inpatient Clinical Dietitian Pager: [phone] After Hours Pager: [phone]

## 2012-06-05 LAB — BASIC METABOLIC PANEL
BUN: 6 mg/dL (ref 6–23)
Calcium: 8.1 mg/dL — ABNORMAL LOW (ref 8.4–10.5)
Creatinine, Ser: 0.67 mg/dL (ref 0.50–1.10)
GFR calc non Af Amer: >90 mL/min (ref >90)
Glucose, Bld: 98 mg/dL (ref 70–99)

## 2012-06-05 LAB — GLUCOSE, CAPILLARY
Glucose-Capillary: 93 mg/dL (ref 70–99)
Glucose-Capillary: 82 mg/dL (ref 70–99)

## 2012-06-05 LAB — CBC
Hemoglobin: 8.6 g/dL — ABNORMAL LOW (ref 12.0–15.0)
MCHC: 32.3 g/dL (ref 30.0–36.0)
Platelets: 361 10*3/uL (ref 150–400)
RBC: 2.76 MIL/uL — ABNORMAL LOW (ref 3.87–5.11)

## 2012-06-05 NOTE — Progress Notes (Signed)
Occupational Therapy Treatment Patient Details Name: [name] MRN: [id number] DOB: 1950/09/30 Today's Date: 06/05/2012 Time: 4098-1191 OT Time Calculation (min): 21 min  OT Assessment / Plan / Recommendation Comments on Treatment Session Pt's cognition has improved significantly since last session but pt still lacks safety awareness and the need for assistance with mobility. Pt states d/c is planned for tomorrow.    Follow Up Recommendations  Home health OT    Barriers to Discharge       Equipment Recommendations  None recommended by OT    Recommendations for Other Services    Frequency Min 2X/week   Plan Discharge plan needs to be updated    Precautions / Restrictions Precautions Precautions: Fall Precaution Comments: partial colectomy, JP drain, monitor O2.   Pertinent Vitals/Pain Pt denied pain    ADL  Grooming: Min guard Where Assessed - Grooming: Supported standing Lower Body Bathing: Set up Where Assessed - Lower Body Bathing: Unsupported sitting Toilet Transfer: Min Pension scheme manager Method: Sit to Barista: Other (comment) (recliner) ADL Comments: Convinced pt to use RW while ambulating at least for a few days. Pt much steadier with it. Pt did c/o dizziness but BP was stable at 139/57.    OT Diagnosis:    OT Problem List:   OT Treatment Interventions:     OT Goals ADL Goals ADL Goal: Grooming - Progress: Progressing toward goals ADL Goal: Lower Body Dressing - Progress: Progressing toward goals ADL Goal: Toilet Transfer - Progress: Progressing toward goals  Visit Information  Last OT Received On: 06/05/12 Assistance Needed: +1    Subjective Data  Subjective: My BP drops in the afternoon. I think thats why I get dizzy.   Prior Functioning       Cognition  Cognition Overall Cognitive Status: Impaired Area of Impairment: Awareness of errors Arousal/Alertness: Awake/alert Orientation Level: Appears intact for tasks  assessed Behavior During Session: [facility] for tasks performed Safety/Judgement: Decreased awareness of need for assistance Awareness of Errors: Assistance required to correct errors made Cognition - Other Comments: Pt does appear much clearer than last session.    Mobility  Transfers Sit to Stand: 4: Min guard Stand to Sit: 4: Min guard Details for Transfer Assistance: Min cues for hand placement and safety.    Exercises      Balance     End of Session OT - End of Session Activity Tolerance: Patient limited by fatigue Patient left: in chair;with call bell/phone within reach  GO     Jiyah Torpey A OTR/L [phone] 06/05/2012, 3:05 PM

## 2012-06-05 NOTE — Progress Notes (Deleted)
Patient was safely transported to 1317 via wheelchair.

## 2012-06-05 NOTE — Progress Notes (Signed)
Pt has refused Glucerna shakes today.  Will continue to offer & encourage po intake. Maeola Harman

## 2012-06-05 NOTE — Consult Note (Addendum)
WOC ostomy consult  Stoma type/location: RUQ Stomal assessment/size: 7/8 inch x 1 and 3/4 inch oval stoma with proximal and distal ends of loop side by side. Peristomal assessment: intact.  Two areas where red rubber catheter bridge was sutured are healing. Treatment options for stomal/peristomal skin: None indicated Output thin dark brown stool Ostomy pouching: 1pc. Pouch with convexity [facility] [place] [phone]) Education provided: Patient is more comfortable using clamp than Lock and Roll pouch closure, so one is provided for her today.  Independent with stoma management (emptying) and pouch changing, but appreciated sizing by [facility] nurse today and provision of pattern.  Supplies for discharge in room.  Wear time is 3-5 days. Please re-consult if needed. Thanks, Ladona Mow, MSN, RN, [facility], CWOCN [phone])

## 2012-06-05 NOTE — Progress Notes (Signed)
CSW following up for discharge planning. Patient declining SNF, plans to return home with home health. RNCM aware. CSW signing off - no further CSW intervention needed.   Clinical Social Work Department CLINICAL SOCIAL WORK PLACEMENT NOTE 06/05/2012  Patient:  [name], [name]  Account Number:  [id number] Admit date:  05/17/2012  Clinical Social Worker:  Orpah Greek  Date/time:  05/30/2012 12:07 PM  Clinical Social Work is seeking post-discharge placement for this patient at the following level of care:   SKILLED NURSING   (*CSW will update this form in Epic as items are completed)   05/30/2012  Patient/family provided with [facility] Department of Clinical Social Work's list of facilities offering this level of care within the geographic area requested by the patient (or if unable, by the patient's family).  05/30/2012  Patient/family informed of their freedom to choose among providers that offer the needed level of care, that participate in Medicare, Medicaid or managed care program needed by the patient, have an available bed and are willing to accept the patient.  05/30/2012  Patient/family informed of [facility]' ownership interest in [facility], as well as of the fact that they are under no obligation to receive care at this facility.  PASARR submitted to EDS on 05/30/2012 PASARR number received from EDS on 05/30/2012  FL2 transmitted to all facilities in geographic area requested by pt/family on  05/30/2012 FL2 transmitted to all facilities within larger geographic area on   Patient informed that his/her managed care company has contracts with or will negotiate with  certain facilities, including the following:     Patient/family informed of bed offers received:  06/02/2012 Patient chooses bed at  Physician recommends and patient chooses bed at    Patient to be transferred to  on   Patient to be transferred to facility by   The following physician request  were entered in Epic:   Additional Comments:      Unice Bailey, LCSW [facility] Clinical Social Worker cell #: [phone]

## 2012-06-05 NOTE — Progress Notes (Signed)
14 Days Post-Op  Subjective: Feeling significantly better.  Eating a little more.  Getting OOB independently.  Coughing less.    Objective: Vital signs in last 24 hours: Temp:  [97.5 F (36.4 C)-98.1 F (36.7 C)] 98.1 F (36.7 C) (02/18 0512) Pulse Rate:  [87-107] 87 (02/18 0512) Resp:  [18] 18 (02/18 0512) BP: (99-106)/(69-74) 104/69 mmHg (02/18 0512) SpO2:  [91 %-98 %] 97 % (02/18 0512) Last BM Date: 06/04/12 (ileostomy)  Intake/Output from previous day: 02/17 0701 - 02/18 0700 In: 1132 [P.O.:570; I.V.:460; IV Piggyback:102] Out: 2500 [Urine:1475; Stool:1025] Intake/Output this shift:    PE: General- In NAD CV-increased rate Lungs-clear Abdomen-soft, loop ileostomy pink with green liquid and pasty output, midline wound open, clean,some fascial dehiscence, underlying tissue is stuck, some exposed mesh is present which was sharply debrided, LLQ open wound is clean.  Lab Results:   Recent Labs  06/03/12 0845  WBC 7.9  HGB 10.0*  HCT 29.3*  PLT 355   BMET  Recent Labs  06/04/12 0830 06/05/12 0453  NA 135 137  K 3.3* 3.8  CL 100 102  CO2 23 24  GLUCOSE 100* 98  BUN 7 6  CREATININE 0.64 0.67  CALCIUM 8.0* 8.1*   PT/INR No results found for this basename: LABPROT, INR,  in the last 72 hours Comprehensive Metabolic Panel:    Component Value Date/Time   NA 137 06/05/2012 0453   NA 130* 02/09/2012 1321   K 3.8 06/05/2012 0453   K 4.6 02/09/2012 1321   CL 102 06/05/2012 0453   CL 101 02/09/2012 1321   CO2 24 06/05/2012 0453   CO2 18* 02/09/2012 1321   BUN 6 06/05/2012 0453   BUN 21.0 02/09/2012 1321   CREATININE 0.67 06/05/2012 0453   CREATININE 0.8 02/09/2012 1321   GLUCOSE 98 06/05/2012 0453   GLUCOSE 79 02/09/2012 1321   CALCIUM 8.1* 06/05/2012 0453   CALCIUM 9.2 02/09/2012 1321   AST 30 05/31/2012 0615   AST 53* 02/09/2012 1321   ALT 23 05/31/2012 0615   ALT 56* 02/09/2012 1321   ALKPHOS 456* 05/31/2012 0615   ALKPHOS 712* 02/09/2012 1321   BILITOT  0.3 05/31/2012 0615   BILITOT 0.50 02/09/2012 1321   PROT 5.8* 05/31/2012 0615   PROT 7.0 02/09/2012 1321   ALBUMIN 2.2* 05/31/2012 0615   ALBUMIN 3.8 02/09/2012 1321     Studies/Results: No results found.  Anti-infectives: Anti-infectives   Start     Dose/Rate Route Frequency Ordered Stop   06/01/12 1000  cefTAZidime (FORTAZ) 2 g in dextrose 5 % 50 mL IVPB     2 g 100 mL/hr over 30 Minutes Intravenous Every 8 hours 06/01/12 0945     06/01/12 0930  cefTAZidime (FORTAZ) injection 2 g  Status:  Discontinued     2 g Intramuscular 3 times per day 06/01/12 0925 06/01/12 0945   05/22/12 0800  ertapenem (INVANZ) 1 g in sodium chloride 0.9 % 50 mL IVPB  Status:  Discontinued     1 g 100 mL/hr over 30 Minutes Intravenous Every 24 hours 05/22/12 0733 05/30/12 0842   05/17/12 2000  cefoTEtan (CEFOTAN) 2 g in dextrose 5 % 50 mL IVPB     2 g 100 mL/hr over 30 Minutes Intravenous Every 12 hours 05/17/12 1312 05/17/12 2114   05/17/12 0606  cefOXitin (MEFOXIN) 2 g in dextrose 5 % 50 mL IVPB     2 g 100 mL/hr over 30 Minutes Intravenous On call to O.R. 05/17/12  1027 05/17/12 0751      Assessment Active Problems: 1.  Chronic ischemic colitis s/p reversal of colostomy, partial colectomy, takedown of mucous fistula 05/17/12  2.  Postop SBO, microleak from anastomosis and abscess s/p expl lap, loa, abscess drainage, oversewing of leak, loop ileostomy 05/22/12-some midline fascia dehiscence, underlying tissue is stuck, some of the exposed mesh was debrided 3.  Afib-SR and ST on preop Beta blocker-she has tachycardia with her anemia 4.  Chronic anemia with ABL- cbc pending 5.  Chronic hyponatremia 6.  CMML (chronic myelomonocytic leukemia) 7.  PC malnutrition-eating a little better. 8.  Hypokalemia-K+ level normal 9.  Severe deconditioned state-slowly improving; she does not want to go to a SNF but rather, she wants to go home with home health care; her daughter will be at home. 10.  Pseudomonas  pneumonia-clinically improved on Ceftazidime     LOS: 19 days   Plan:   Arrange for HHPT, HHOT, home health wound and ostomy care.  Check CBC.  Continue antibiotics.  Possible discharge 2/19. Adolph Pollack 06/05/2012

## 2012-06-06 LAB — BASIC METABOLIC PANEL
BUN: 6 mg/dL (ref 6–23)
Calcium: 8.4 mg/dL (ref 8.4–10.5)
GFR calc Af Amer: >90 mL/min (ref >90)
GFR calc non Af Amer: >90 mL/min (ref >90)
Potassium: 4.3 mEq/L (ref 3.5–5.1)

## 2012-06-06 LAB — GLUCOSE, CAPILLARY: Glucose-Capillary: 83 mg/dL (ref 70–99)

## 2012-06-06 MED ORDER — FERROUS SULFATE 325 (65 FE) MG PO TABS: 325.0000 mg | ORAL_TABLET | Freq: Three times a day (TID) | ORAL | Status: DC

## 2012-06-06 MED ORDER — MAGNESIUM OXIDE 400 (241.3 MG) MG PO TABS: 800.0000 mg | ORAL_TABLET | Freq: Every day | ORAL | Status: DC

## 2012-06-06 MED ORDER — OXYCODONE HCL 5 MG PO TABS: 5.0000 mg | ORAL_TABLET | ORAL | Status: DC | PRN

## 2012-06-06 MED ORDER — CIPROFLOXACIN HCL 500 MG PO TABS: 750.0000 mg | ORAL_TABLET | Freq: Two times a day (BID) | ORAL | Status: DC

## 2012-06-06 NOTE — Progress Notes (Signed)
Physical Therapy Treatment Patient Details Name: Ronnald Collum Rauh MRN: [id number] DOB: Jun 23, 1950 Today's Date: 06/06/2012 Time: 4098-1191 PT Time Calculation (min): 28 min  PT Assessment / Plan / Recommendation Comments on Treatment Session  Pt ambulated in hallway and performed exercises today.  Pt reports feeling weak and tired due to low Hgb stating it's usually around 10 not 8 and hoping it will improve once home.  Pt reports she will have assist at home and feels ready to d/c.  No further question about d/c home and agreeable to HHPT.    Follow Up Recommendations  Home health PT;Supervision for mobility/OOB     Does the patient have the potential to tolerate intense rehabilitation     Barriers to Discharge        Equipment Recommendations  None recommended by PT    Recommendations for Other Services    Frequency     Plan Discharge plan remains appropriate;Frequency remains appropriate    Precautions / Restrictions Precautions Precautions: Fall Precaution Comments: partial colectomy Restrictions Weight Bearing Restrictions: No   Pertinent Vitals/Pain Pt reports occasional pain near ileostomy site with exercises (adjusted exercises) but none at rest.    Mobility  Bed Mobility Bed Mobility: Supine to Sit Supine to Sit: 6: Modified independent (Device/Increase time) Transfers Transfers: Sit to Stand;Stand to Sit;Stand Pivot Transfers Sit to Stand: 6: Modified independent (Device/Increase time);From bed;With upper extremity assist Stand to Sit: 6: Modified independent (Device/Increase time);To bed;To chair/3-in-1;With upper extremity assist Stand Pivot Transfers: 6: Modified independent (Device/Increase time) Details for Transfer Assistance: Pt up to [facility] modified independent only required increased time Ambulation/Gait Ambulation/Gait Assistance: 4: Min guard Ambulation Distance (Feet): 60 Feet (x2) Assistive device: None Ambulation/Gait Assistance Details: pt pushed IV  pole for 60 feet then no UE support for 60 feet back to room, no unsteadiness or LOB today Gait Pattern: Step-through pattern;Decreased stride length Gait velocity: decreased General Gait Details: no breaks required today    Exercises General Exercises - Lower Extremity Long Arc Quad: AROM;Both;15 reps Hip ABduction/ADduction: AROM;Both;10 reps;Standing;Supine;Other (comment) (standing with one UE support) Straight Leg Raises: AROM;Both;15 reps;Supine Hip Flexion/Marching: AROM;Both;15 reps;Seated Heel Raises: AROM;Both;10 reps;Standing   PT Diagnosis:    PT Problem List:   PT Treatment Interventions:     PT Goals Acute Rehab PT Goals PT Goal: Supine/Side to Sit - Progress: Met PT Goal: Sit to Stand - Progress: Met PT Goal: Stand to Sit - Progress: Met PT Goal: Ambulate - Progress: Progressing toward goal  Visit Information  Last PT Received On: 06/06/12 Assistance Needed: +1    Subjective Data  Subjective: I'd like to do exercises so I can do some at home. Patient Stated Goal: hopefully d/c home today   Cognition  Cognition Overall Cognitive Status: Appears within functional limits for tasks assessed/performed Arousal/Alertness: Awake/alert Orientation Level: Appears intact for tasks assessed Behavior During Session: [facility] for tasks performed    Balance     End of Session PT - End of Session Activity Tolerance: Patient limited by fatigue Patient left: in chair;with call bell/phone within reach   GP     Amia Rynders,KATHrine E 06/06/2012, 10:48 AM Zenovia Jarred, PT, DPT 06/06/2012 Pager: [phone]

## 2012-06-06 NOTE — Progress Notes (Signed)
15 Days Post-Op  Subjective: Taking diet okay.  OOB independently.    Objective: Vital signs in last 24 hours: Temp:  [97.4 F (36.3 C)-98.3 F (36.8 C)] 98.3 F (36.8 C) (02/19 0540) Pulse Rate:  [107-108] 108 (02/19 0540) Resp:  [16-18] 16 (02/19 0540) BP: (110-127)/(64-84) 127/84 mmHg (02/19 0540) SpO2:  [95 %-98 %] 95 % (02/19 0540) Last BM Date: 06/05/12  Intake/Output from previous day: 02/18 0701 - 02/19 0700 In: 1801.3 [P.O.:1240; I.V.:461.3; IV Piggyback:100] Out: 3260 [Urine:2225; Stool:1035] Intake/Output this shift: Total I/O In: 120 [P.O.:120] Out: -   PE: General- In NAD Abdomen-soft, loop ileostomy pink with green liquid and pasty output, midline wound open, clean,some fascial dehiscence, underlying tissue is stuck, some exposed mesh is present, LLQ open wound is clean.  Lab Results:   Recent Labs  06/05/12 1000  WBC 7.3  HGB 8.6*  HCT 26.6*  PLT 361   BMET  Recent Labs  06/05/12 0453 06/06/12 0405  NA 137 135  K 3.8 4.3  CL 102 103  CO2 24 23  GLUCOSE 98 90  BUN 6 6  CREATININE 0.67 0.68  CALCIUM 8.1* 8.4   PT/INR No results found for this basename: LABPROT, INR,  in the last 72 hours Comprehensive Metabolic Panel:    Component Value Date/Time   NA 135 06/06/2012 0405   NA 130* 02/09/2012 1321   K 4.3 06/06/2012 0405   K 4.6 02/09/2012 1321   CL 103 06/06/2012 0405   CL 101 02/09/2012 1321   CO2 23 06/06/2012 0405   CO2 18* 02/09/2012 1321   BUN 6 06/06/2012 0405   BUN 21.0 02/09/2012 1321   CREATININE 0.68 06/06/2012 0405   CREATININE 0.8 02/09/2012 1321   GLUCOSE 90 06/06/2012 0405   GLUCOSE 79 02/09/2012 1321   CALCIUM 8.4 06/06/2012 0405   CALCIUM 9.2 02/09/2012 1321   AST 30 05/31/2012 0615   AST 53* 02/09/2012 1321   ALT 23 05/31/2012 0615   ALT 56* 02/09/2012 1321   ALKPHOS 456* 05/31/2012 0615   ALKPHOS 712* 02/09/2012 1321   BILITOT 0.3 05/31/2012 0615   BILITOT 0.50 02/09/2012 1321   PROT 5.8* 05/31/2012 0615   PROT  7.0 02/09/2012 1321   ALBUMIN 2.2* 05/31/2012 0615   ALBUMIN 3.8 02/09/2012 1321     Studies/Results: No results found.  Anti-infectives: Anti-infectives   Start     Dose/Rate Route Frequency Ordered Stop   06/01/12 1000  cefTAZidime (FORTAZ) 2 g in dextrose 5 % 50 mL IVPB     2 g 100 mL/hr over 30 Minutes Intravenous Every 8 hours 06/01/12 0945     06/01/12 0930  cefTAZidime (FORTAZ) injection 2 g  Status:  Discontinued     2 g Intramuscular 3 times per day 06/01/12 0925 06/01/12 0945   05/22/12 0800  ertapenem (INVANZ) 1 g in sodium chloride 0.9 % 50 mL IVPB  Status:  Discontinued     1 g 100 mL/hr over 30 Minutes Intravenous Every 24 hours 05/22/12 0733 05/30/12 0842   05/17/12 2000  cefoTEtan (CEFOTAN) 2 g in dextrose 5 % 50 mL IVPB     2 g 100 mL/hr over 30 Minutes Intravenous Every 12 hours 05/17/12 1312 05/17/12 2114   05/17/12 0606  cefOXitin (MEFOXIN) 2 g in dextrose 5 % 50 mL IVPB     2 g 100 mL/hr over 30 Minutes Intravenous On call to O.R. 05/17/12 0606 05/17/12 0751      Assessment Active Problems:  1.  Chronic ischemic colitis s/p reversal of colostomy, partial colectomy, takedown of mucous fistula 05/17/12  2.  Postop SBO, microleak from anastomosis and abscess s/p expl lap, loa, abscess drainage, oversewing of leak, loop ileostomy 05/22/12-some midline fascia dehiscence, underlying tissue is stuck. 3.  Afib-SR and ST on preop Beta blocker-she has tachycardia with her anemia 4.  Chronic anemia with ABL- hemoglobin 8.6 5.  Chronic hyponatremia 6.  CMML (chronic myelomonocytic leukemia) 7.  PC malnutrition-eating a little better. 8.  Hypokalemia-K+ level normal; hypomagnesemia-on oral replacement. 9.  Severe deconditioned state-slowly improving, she wants to go home with home health care; her daughter will be at home. 10.  Pseudomonas pneumonia-clinically improved on Ceftazidime     LOS: 20 days   Plan:   Discharge with HHPT, HHOT, home health wound and ostomy  care.  Send home on oral Cipro, Iron, OxyIR, Magnesium supplement.  Hold diuretics and Ace inhibitor, resume other home meds.  Follow up in one week. Ajai Harville J 06/06/2012

## 2012-06-06 NOTE — Progress Notes (Signed)
Home health care arranged with [facility] as patient requested. Abelino Derrick RN,BSN,MHA

## 2012-06-13 ENCOUNTER — Ambulatory Visit (INDEPENDENT_AMBULATORY_CARE_PROVIDER_SITE_OTHER): Payer: BC Managed Care – PPO | Admitting: General Surgery

## 2012-06-13 ENCOUNTER — Encounter (INDEPENDENT_AMBULATORY_CARE_PROVIDER_SITE_OTHER): Payer: Self-pay | Admitting: General Surgery

## 2012-06-13 ENCOUNTER — Telehealth (INDEPENDENT_AMBULATORY_CARE_PROVIDER_SITE_OTHER): Payer: Self-pay

## 2012-06-13 MED ORDER — ONDANSETRON HCL 4 MG PO TABS: 4.0000 mg | ORAL_TABLET | Freq: Four times a day (QID) | ORAL | Status: DC | PRN

## 2012-06-13 MED ORDER — FLUCONAZOLE 100 MG PO TABS: 150.0000 mg | ORAL_TABLET | Freq: Every day | ORAL | Status: AC

## 2012-06-13 MED ORDER — MAGIC MOUTHWASH: 5.0000 mL | Freq: Two times a day (BID) | ORAL | Status: DC

## 2012-06-13 NOTE — Telephone Encounter (Signed)
Patient called in with nausea and vomiting. She thinks it is either her abx making her sick or that her pain medicine is to strong and request hydrocodone and refill of zofran as she is almost out. I told her we would address with Dr Abbey Chatters when he gets here and we would call her back.

## 2012-06-13 NOTE — Progress Notes (Signed)
Patient ID: [name], female   DOB: [birth year], 62 y.o.   MRN: [id number] She called and spoke with my assistance. She's having some problems with nausea and vomiting and thinks it's related to the Cipro or the oxycodone. She also has problems with the [facility] tongue. She is supposed to be seen this morning but rescheduled herself for February 28. She was instructed to stop the Cipro. We'll: Hydrocodone as well as Zofran, Diflucan, and Magic mouthwash. She states the ileostomy is working well.

## 2012-06-13 NOTE — Telephone Encounter (Signed)
Instructed pt to d/c her Cipro.  Will call in Diflucan and Norco 5/325 to [facility].  Pt is aware.

## 2012-06-14 NOTE — Discharge Summary (Signed)
Physician Discharge Summary  Patient ID: [name] MRN: [id number] DOB/AGE: 1950-10-23 62 y.o.  Admit date: 05/17/2012 Discharge date: 06/06/2012  Admission Diagnoses:  Recurrent ischemic colitis  Discharge Diagnoses:  Active Problems:   Recurrent  ischemic colitis   Pelvic abscess due to anastomotic microleak   Postoperative SBO   Pneumonia-Pseudomonas   Volume overload   Atrial fibrillation with RVR   PC malnutrition   Hypokalemia   Hyponatremia   Fascial dehiscence   Deconditioned state   Wound infection   Chronic hyponatremia   CMML (chronic myelomonocytic leukemia)   Discharged Condition: fair  Hospital Course: She was admitted and underwent partial colectomy and takedown of right-sided colostomy and left sigmoid mucous fistula. She had an ileum to distal sigmoid anastomosis performed. She had a little shortness of breath the first and second postoperative days and some acute blood loss anemia. She also developed a wound infection by her fourth postoperative day. She was given a blood transfusion. She had some hypokalemia and hypomagnesemia which had to be corrected. She had return of her bowel function by second postoperative day and was tolerating her diet. On 05/22/12 she is noted to have development of some tachycardia well fever and leukocytosis.  She also had some nausea and vomiting. CT scan demonstrated an involving pelvic abscess and concern for possible anastomotic leak. It also demonstrated a complete small bowel obstruction.  Because of those findings, she is taken back to the operating room where she had exploratory laparotomy, lysis of adhesions for small bowel stretching, drainage of pelvic abscess. Initially no obvious anastomotic leak could be found. However with some provocative maneuvers a pinhole-type leak was noted in the medial area of the anastomosis which appeared to be slightly ischemic. This was oversewn. The loop ileostomy was placed in the previous right  upper quadrant ostomy site and drains were placed.  Postoperatively she was taken to the intensive period. She developed atrial fibrillation with rapid ventricular response a cardiology consultation was obtained. She responded to a Cardizem drip and diuresis. Midline wound was left open and was packed. There is left lower quadrant wound was also open and packed. She was started on TPN and is on broad-spectrum antibiotics. Subsequently she is able to be moved to the floor. When her bowel function began to return by way of output from the loop ileostomy the NG tube was removed and she started a clear liquid diet. Physical therapy and occupational therapy consultations were obtained. She did have some problems with hypokalemia and hypomagnesemia and had had supplementation of these fairly regularly.   Grade 2 fibrillation was under good control and she Be switched over to oral medications for this. Her diet was advanced and she started improving with that. She did develop a cough was productive. This led to a fascial dehiscence but at this time she is in no condition to go back to the operating room. Sputum cultures were sent and came back positive for pseudomonas Pneumonia. She was started on ceftazidime for this and responded well.The underlying tissue was stuck to the posterior fascia. Wet-to-dry dressing changes were continued on the midline wound. Some mesh from her previous operation was noted in the wound and this was debrided sharply. Initially, discussion was 4 a skilled nursing facility but she became more independent and made good progress. Her ostomy was working well. She was eating well. She's getting out of bed independently.  Home health nursing Rangers were made. Her antibiotics were switched to oral ciprofloxacin and  she was able to be discharged on 06/06/2012.  She was discharged to home with home health care. She is given pain medicine as well as oral ciprofloxacin to complete for the  pneumonia. She'll be seen in the office in one week.  Consults: cardiology  Treatments: surgery: Right colostomy takedown, partial colectomy, take down of sigmoid mucous fistula 05/17/2012; exploratory laparotomy, lysis of adhesions, oversewing of microleak from anastomosis, drainage of pelvic abscess 05/22/2012.  Discharge Exam: Blood pressure 127/84, pulse 108, temperature 98.3 F (36.8 C), temperature source Oral, resp. rate 16, height 5\' 5"  (1.651 m), weight 166 lb 12.8 oz (75.66 kg), SpO2 95.00%. none Disposition: 01-Home or Self Care   Future Appointments Provider Department Dept Phone   06/15/2012 4:50 PM Adolph Pollack, MD [facility] Surgery, [place] [phone]   07/23/2012 11:15 AM Windell Hummingbird [facility] MEDICAL ONCOLOGY [phone]   07/23/2012 11:45 AM Myrtis Ser, NP [facility] MEDICAL ONCOLOGY [phone]       Medication List    STOP taking these medications       benazepril 20 MG tablet  Commonly known as:  LOTENSIN     furosemide 20 MG tablet  Commonly known as:  LASIX     hydrochlorothiazide 25 MG tablet  Commonly known as:  HYDRODIURIL      TAKE these medications       aspirin 325 MG EC tablet  Take 325 mg by mouth daily.     bisoprolol 5 MG tablet  Commonly known as:  ZEBETA  Take 5 mg by mouth daily before breakfast.     buPROPion 300 MG 24 hr tablet  Commonly known as:  WELLBUTRIN XL  Take 300 mg by mouth daily before breakfast.     celecoxib 200 MG capsule  Commonly known as:  CELEBREX  Take 200 mg by mouth every morning.     ciprofloxacin 500 MG tablet  Commonly known as:  CIPRO  Take 1.5 tablets (750 mg total) by mouth 2 (two) times daily.     esomeprazole 40 MG capsule  Commonly known as:  NEXIUM  Take 40 mg by mouth every morning.     estradiol 2 MG tablet  Commonly known as:  ESTRACE  Take 2 mg by mouth daily before breakfast.     ferrous sulfate 325 (65 FE) MG tablet  Take 1 tablet  (325 mg total) by mouth 3 (three) times daily with meals.     hydrocortisone cream 0.5 %  Apply topically as needed. As needed to occasional hives on back     loratadine-pseudoephedrine 5-120 MG per tablet  Commonly known as:  CLARITIN-D 12-hour  Take 1 tablet by mouth 2 (two) times daily.     magnesium oxide 400 (241.3 MG) MG tablet  Commonly known as:  MAG-OX  Take 2 tablets (800 mg total) by mouth daily with lunch.     montelukast 10 MG tablet  Commonly known as:  SINGULAIR  Take 10 mg by mouth as needed. For breathing nasal drainage congestion relief     nystatin powder  Commonly known as:  MYCOSTATIN  Apply topically as needed. To area by left side by breast     oxyCODONE 5 MG immediate release tablet  Commonly known as:  Oxy IR/ROXICODONE  Take 1-2 tablets (5-10 mg total) by mouth every 4 (four) hours as needed.     promethazine 25 MG tablet  Commonly known as:  PHENERGAN  Take 1 tablet (  25 mg total) by mouth every 6 (six) hours as needed for nausea.           Follow-up Information   Follow up with Cassell Clement, MD.   Contact information:   [address] [place] [address] [phone]       Signed: Adolph Pollack 06/14/2012, 3:41 PM

## 2012-06-15 ENCOUNTER — Encounter (INDEPENDENT_AMBULATORY_CARE_PROVIDER_SITE_OTHER): Payer: BC Managed Care – PPO | Admitting: General Surgery

## 2012-06-15 ENCOUNTER — Encounter (INDEPENDENT_AMBULATORY_CARE_PROVIDER_SITE_OTHER): Payer: Self-pay | Admitting: General Surgery

## 2012-06-15 ENCOUNTER — Ambulatory Visit (INDEPENDENT_AMBULATORY_CARE_PROVIDER_SITE_OTHER): Payer: BC Managed Care – PPO | Admitting: General Surgery

## 2012-06-15 ENCOUNTER — Telehealth (INDEPENDENT_AMBULATORY_CARE_PROVIDER_SITE_OTHER): Payer: Self-pay | Admitting: *Deleted

## 2012-06-15 VITALS — BP 118/86 | HR 74 | Temp 98.3°F | Resp 18 | Ht 65.0 in | Wt 151.0 lb

## 2012-06-15 DIAGNOSIS — Z9889 Other specified postprocedural states: Secondary | ICD-10-CM

## 2012-06-15 DIAGNOSIS — D649 Anemia, unspecified: Secondary | ICD-10-CM

## 2012-06-15 MED ORDER — ONDANSETRON HCL 4 MG PO TABS: 4.0000 mg | ORAL_TABLET | ORAL | Status: DC | PRN

## 2012-06-15 NOTE — Telephone Encounter (Signed)
Annice Pih with [facility] called to ask for order to see patient twice next week, once the week after and then reassess patient needs.  Spoke to [employer] MD who states patient is coming in for an appt this afternoon so once he has assessed wound he will let us know.

## 2012-06-15 NOTE — Progress Notes (Signed)
Subjective:     Patient ID: [name], female   DOB: Sep 29, 1950, 62 y.o.   MRN: [id number]  HPI  She is here for her first post operative visit. She had some nausea and vomiting Wednesday which was felt to be related to some medication which has been stopped. She still has some nausea but the vomiting has stopped.  The loop ileostomy is draining pasty-type stool. She's able to keep down some smoothies in liquids and had a sausage biscuit this morning. She is taking Zofran for the nausea. She feels a little dizzy at times. She states she is voiding well.   Review of Systems  Has some weakness. Dressings are being changed once to twice a day.  Having some problems with leaking from the ileostomy appliance. A new one has been ordered.     Objective:   Physical Exam Gen.-she looks slightly pale but is in no acute distress. Vital signs are within normal limits.  Abdomen-wound is opened in the midline and I debrided some of the mesh. The granulation tissue was forming. No purulent drainage. Left lower quadrant open wound is healing in well by secondary intention. Loop ileostomy looks good and I changed the appliance.    Assessment:     Having some postoperative nausea. Ileostomy is working well which is ruling out obstruction.  It seems like the nausea may be related to medication. She is still on iron and this seems to be causing some of the nausea.     Plan:     Stop the iron. Check electrolytes and CBC. I've encouraged her to drink as many smoothies as possible and to make boost or Ensure milkshakes.  Continue current wound care. Return visit next week.

## 2012-06-15 NOTE — Patient Instructions (Signed)
Make milkshakes using Boost or Ensure and vanilla ice cream.  Drink smoothies with protein added to them.  Eat whatever you want.

## 2012-06-18 ENCOUNTER — Telehealth (INDEPENDENT_AMBULATORY_CARE_PROVIDER_SITE_OTHER): Payer: Self-pay | Admitting: General Surgery

## 2012-06-18 NOTE — Telephone Encounter (Signed)
Pt's sister called to report pt is still having lots of problems with her ileostomy bags and is also due to have labs drawn.  Sister asked if OK to have the home health nurse draw the blood.  Sister informed this is OK, but she should call her first to let her know, so she will come with the necessary equipment.

## 2012-06-18 NOTE — Telephone Encounter (Signed)
Spoke to [employer] MD who is asking to have [facility] go out if possible today, tomorrow, and Wednesday.  Patient is due to come back on Wednesday for a PO visit.  A message was left on Jackie's voicemail to inform her of change in order.

## 2012-06-18 NOTE — Telephone Encounter (Signed)
Home health nurse called to report she has not been able to successfully stick pt for labs, but will try again tomorrow.

## 2012-06-20 ENCOUNTER — Ambulatory Visit (INDEPENDENT_AMBULATORY_CARE_PROVIDER_SITE_OTHER): Payer: BC Managed Care – PPO | Admitting: General Surgery

## 2012-06-20 ENCOUNTER — Encounter (INDEPENDENT_AMBULATORY_CARE_PROVIDER_SITE_OTHER): Payer: Self-pay | Admitting: General Surgery

## 2012-06-20 VITALS — BP 124/70 | HR 84 | Temp 97.6°F | Resp 24 | Ht 65.0 in | Wt 149.0 lb

## 2012-06-20 DIAGNOSIS — Z932 Ileostomy status: Secondary | ICD-10-CM | POA: Insufficient documentation

## 2012-06-20 DIAGNOSIS — S31109D Unspecified open wound of abdominal wall, unspecified quadrant without penetration into peritoneal cavity, subsequent encounter: Secondary | ICD-10-CM

## 2012-06-20 DIAGNOSIS — S31109A Unspecified open wound of abdominal wall, unspecified quadrant without penetration into peritoneal cavity, initial encounter: Secondary | ICD-10-CM | POA: Insufficient documentation

## 2012-06-20 LAB — BASIC METABOLIC PANEL
Calcium: 8.7 mg/dL (ref 8.4–10.5)
Creat: 1.44 mg/dL — ABNORMAL HIGH (ref 0.50–1.10)

## 2012-06-20 LAB — CBC
Platelets: 333 10*3/uL (ref 150–400)
RBC: 2.82 MIL/uL — ABNORMAL LOW (ref 3.87–5.11)
WBC: 7.8 10*3/uL (ref 4.0–10.5)

## 2012-06-20 NOTE — Progress Notes (Signed)
Subjective:     Patient ID: [name], female   DOB: [birth year], 62 y.o.   MRN: [id number]  HPI  She is here for her second post operative visit.  She is eating better. Still has intermittent problems with leaking around the ileostomy but this seems to be improving.  Review of Systems      Objective:   Physical Exam Gen.- She looks like she has more energy today.  Abdomen-wound is opened in the midline and I debrided some of the mesh. The granulation tissue is forming. No purulent drainage. Left lower quadrant open wound is healing in well by secondary intention. Loop ileostomy looks good.    Assessment:     She is slowly improving overall. She has not been able to get her blood drawn for laboratory tests but is going after this visit.    Plan:     Increase protein and diet. Take multivitamin with iron. Continue wound care and ostomy care. Return visit one week.

## 2012-06-20 NOTE — Patient Instructions (Signed)
Continue current wound and ileostomy care.

## 2012-06-21 ENCOUNTER — Telehealth (INDEPENDENT_AMBULATORY_CARE_PROVIDER_SITE_OTHER): Payer: Self-pay

## 2012-06-21 ENCOUNTER — Telehealth (INDEPENDENT_AMBULATORY_CARE_PROVIDER_SITE_OTHER): Payer: Self-pay | Admitting: General Surgery

## 2012-06-21 NOTE — Telephone Encounter (Signed)
Pt given lab results.  She was instructed to start taking Magnesium bid and to drink plenty of water daily per Dr. Abbey Chatters.  She is drinking, but says that it is hard to keep up w/ ileostomy output.  I told her that output was expected and to to do the best she can.  Otherwise, she is doing fine.

## 2012-06-21 NOTE — Telephone Encounter (Signed)
[name], with [facility], called to report a "critical lab value" for this pt:  Magnesium 0.8.  There are other abnormal labs reported as well.  All are in Epic.

## 2012-06-27 ENCOUNTER — Ambulatory Visit (INDEPENDENT_AMBULATORY_CARE_PROVIDER_SITE_OTHER): Payer: BC Managed Care – PPO | Admitting: General Surgery

## 2012-06-27 ENCOUNTER — Other Ambulatory Visit (INDEPENDENT_AMBULATORY_CARE_PROVIDER_SITE_OTHER): Payer: Self-pay | Admitting: General Surgery

## 2012-06-27 ENCOUNTER — Telehealth (INDEPENDENT_AMBULATORY_CARE_PROVIDER_SITE_OTHER): Payer: Self-pay | Admitting: *Deleted

## 2012-06-27 ENCOUNTER — Encounter (INDEPENDENT_AMBULATORY_CARE_PROVIDER_SITE_OTHER): Payer: Self-pay | Admitting: General Surgery

## 2012-06-27 DIAGNOSIS — E860 Dehydration: Secondary | ICD-10-CM

## 2012-06-27 LAB — BASIC METABOLIC PANEL
Chloride: 107 mEq/L (ref 96–112)
Creat: 1.14 mg/dL — ABNORMAL HIGH (ref 0.50–1.10)
Potassium: 4.4 mEq/L (ref 3.5–5.3)

## 2012-06-27 MED ORDER — HYDROCODONE-ACETAMINOPHEN 5-325 MG PO TABS: 1.0000 | ORAL_TABLET | ORAL | Status: DC | PRN

## 2012-06-27 NOTE — Patient Instructions (Signed)
Go and get lab drawn today. Take Imodium as prescribed on the bottle. If Imodium does not work, please call us and we can get you a prescription for Lomotil.

## 2012-06-27 NOTE — Telephone Encounter (Signed)
[facility] called to clarify Norco order written by Abbey Chatters MD today.  Added onto order every 4-6 hours.

## 2012-06-27 NOTE — Progress Notes (Signed)
Subjective:     Patient ID: [name], female   DOB: October 27, 1950, 62 y.o.   MRN: [id number]  HPI  She is here for her another post operative visit.  She is eating better. She found an ileostomy appliance that is working better. She still is having to drain it frequently. Her magnesium was Sanchez and her creatinine was slightly elevated on her lab work last week. I doubled the magnesium and encouraged her to increase her fluid intake which she is trying to do.  Review of Systems      Objective:   Physical Exam Gen.- She has gained some weight.  Abdomen-wound is opened in the midline and I debrided some of the mesh. The granulation tissue is forming. No purulent drainage. Left lower quadrant open wound is healing in well by secondary intention. Loop ileostomy looks good.    Assessment:     She is making slow progress. Has some dehydration and hypomagnesemia.   Plan:    Recheck lab today. If she remains dehydrated and will need to insert a PICC line and give her home IV fluids. I've told her to start using some Imodium as this doesn't work we can try Lomotil. Continue current wound care. Return visit 2 weeks.

## 2012-06-28 ENCOUNTER — Telehealth (INDEPENDENT_AMBULATORY_CARE_PROVIDER_SITE_OTHER): Payer: Self-pay | Admitting: *Deleted

## 2012-06-28 LAB — MAGNESIUM: Magnesium: 0.7 mg/dL — ABNORMAL LOW (ref 1.5–2.5)

## 2012-06-28 NOTE — Telephone Encounter (Signed)
Annice Pih with [facility] called to state her plan is to see the patient next week then the week after then discharge patient.  Just wanted to let us know.  Also explained the new order for taking the Magnesium so that she can reiterate with the patient.

## 2012-06-28 NOTE — Telephone Encounter (Signed)
Spoke to [employer] MD who states to tell patient hydration status looks better.  Take 2 Magnesium tablets twice a day.

## 2012-06-28 NOTE — Telephone Encounter (Signed)
Called and spoke with patient to give instructions regarding increasing to twice a day 2 tablets.  Rosenbower MD gave permission to call in refill of prescription if patient needs it since we are increasing dosage daily.  Patient states understanding and agreeable at this time.

## 2012-06-28 NOTE — Telephone Encounter (Signed)
Revonda Standard called from [facility] to inform of patient's magnesium level 0.7.  Rosenbower MD paged to inform him.

## 2012-07-03 ENCOUNTER — Telehealth (INDEPENDENT_AMBULATORY_CARE_PROVIDER_SITE_OTHER): Payer: Self-pay | Admitting: *Deleted

## 2012-07-03 NOTE — Telephone Encounter (Signed)
Prescription called into [facility] [phone] for Lomotil 2.5mg  tablets take 1-2 tabs every 4-6 hours as needed for diarrhea #30 no refills (do not exceed 20mg /24hr).  Patient made aware of prescription and will pick it up.

## 2012-07-03 NOTE — Telephone Encounter (Signed)
Patient called to state that the Imodium has not been working and is asking about a prescription for Lomotil which Dr. Abbey Chatters comments to in his 06/27/12 office visit note.

## 2012-07-12 ENCOUNTER — Encounter (INDEPENDENT_AMBULATORY_CARE_PROVIDER_SITE_OTHER): Payer: BC Managed Care – PPO | Admitting: General Surgery

## 2012-07-13 ENCOUNTER — Other Ambulatory Visit (INDEPENDENT_AMBULATORY_CARE_PROVIDER_SITE_OTHER): Payer: Self-pay | Admitting: General Surgery

## 2012-07-13 ENCOUNTER — Telehealth (INDEPENDENT_AMBULATORY_CARE_PROVIDER_SITE_OTHER): Payer: Self-pay | Admitting: *Deleted

## 2012-07-13 ENCOUNTER — Other Ambulatory Visit (INDEPENDENT_AMBULATORY_CARE_PROVIDER_SITE_OTHER): Payer: Self-pay | Admitting: *Deleted

## 2012-07-13 DIAGNOSIS — R197 Diarrhea, unspecified: Secondary | ICD-10-CM

## 2012-07-13 NOTE — Telephone Encounter (Signed)
Order received from Rosenbower MD to refill Lomotil 2.5mg  1-2 tablets every 4-6 hours as needed for diarrhea. #30 no refills.  Called into [facility] [phone]. Also informed patient that she needs to go over to [facility] to have BMET and Magnesium level drawn prior to her appt on Tuesday.  Patient states understanding and agreeable at this time.

## 2012-07-13 NOTE — Telephone Encounter (Signed)
Patient called to ask for refill of Lomotil.  Patient states she is still taking 3-4/day and will be running out this weekend.  Message given to Rosenbower MD.  Awaiting response.

## 2012-07-16 ENCOUNTER — Other Ambulatory Visit (INDEPENDENT_AMBULATORY_CARE_PROVIDER_SITE_OTHER): Payer: Self-pay | Admitting: General Surgery

## 2012-07-17 ENCOUNTER — Ambulatory Visit (INDEPENDENT_AMBULATORY_CARE_PROVIDER_SITE_OTHER): Payer: BC Managed Care – PPO | Admitting: General Surgery

## 2012-07-17 ENCOUNTER — Encounter (INDEPENDENT_AMBULATORY_CARE_PROVIDER_SITE_OTHER): Payer: Self-pay | Admitting: General Surgery

## 2012-07-17 ENCOUNTER — Telehealth (INDEPENDENT_AMBULATORY_CARE_PROVIDER_SITE_OTHER): Payer: Self-pay | Admitting: *Deleted

## 2012-07-17 LAB — BASIC METABOLIC PANEL
BUN: 13 mg/dL (ref 6–23)
Chloride: 107 mEq/L (ref 96–112)
Glucose, Bld: 83 mg/dL (ref 70–99)
Potassium: 4.7 mEq/L (ref 3.5–5.3)
Sodium: 139 mEq/L (ref 135–145)

## 2012-07-17 LAB — ALBUMIN: Albumin: 3.5 g/dL (ref 3.5–5.2)

## 2012-07-17 MED ORDER — CHOLESTYRAMINE 4 G PO PACK: 1.0000 | PACK | Freq: Two times a day (BID) | ORAL | Status: DC

## 2012-07-17 NOTE — Telephone Encounter (Signed)
Called [facility] to add on Albumin order per Rosenbower MD.  They will call if they are unable to run otherwise they will proceed.

## 2012-07-17 NOTE — Patient Instructions (Signed)
Take 3 TUMS 3 times a day.

## 2012-07-17 NOTE — Progress Notes (Signed)
Patient ID: [name], female   DOB: [birth year], 62 y.o.   MRN: [id number]  Chief Complaint  Patient presents with  . Routine Post Op    reck wound    HPI [name] is a 62 y.o. female.   HPI  She is here for wound check and followup visit. She has some persistent hypomagnesemia. She also has some hypocalcemia. BUN and creatinine are better. She's having difficulty taking magnesium twice a day. She still having episodes of weakness. She is eating better. She's on Imodium and Lomotil to help slow her ileostomy output down.  Past Medical History  Diagnosis Date  . Hypertension     She has a past hx of essential  . 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 Inflammation of joint of knee     Since we last last saw her she developed problems with an acute which required surgical drainage by her orthopedist Dr. Cleophas Dunker.  Marland Kitchen MRSA (methicillin resistant Staphylococcus aureus)     Knee surgery drainage was positive for MRSA and she was treated with 3 weeks of doxycycline successfully.  . Diarrhea     Mild  . Exogenous obesity   . GERD (gastroesophageal reflux disease)     2 hosp.- ischemic colitis - residual of Norovirus, 05/2011- sm. bowel obstruction  . Headache     migraine headache on occas, less now than when she was younger   . Arthritis     L hip, back, neck   . History of blood transfusion sept 2013    04/2011- /w ischemic colitis , trouble with matching blood  sept 2013  . Anemia     will see hematology consult prior to surgery, recommended by Dr. Patty Sermons  . Anemia 12/15/2010  . Ischemic colitis 01/31/2012  . Atrial flutter     during hospitalization, 04/2011- related to anemia & illness/stress   . Pneumonia     04/2011- not hospitalized , pt. denies SOB, changes in chest, breathing  . 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 Dizziness     occasional    Past Surgical History  Procedure  Laterality Date  . Knee surgery      I&D- 2008, post laceration   . Colonoscopy  05/16/2011    Procedure: COLONOSCOPY;  Surgeon: Vertell Novak., MD;  Location: [facility];  Service: Endoscopy;  Laterality: N/A;  . Small intestine surgery  1992, 1999  . Laparotomy and lysis of adhesions    . Total hip arthroplasty  10/25/2011    Procedure: TOTAL HIP ARTHROPLASTY;  Surgeon: Valeria Batman, MD;  Location[facility];  Service: Orthopedics;  Laterality: Left;  . Appendectomy  1962  . Abdominal hysterectomy  1988  . Partial colectomy and colostomy  sept 2013    mucous fistula done  . Partial colectomy  05/17/2012    Procedure: PARTIAL COLECTOMY;  Surgeon: Adolph Pollack, MD;  Location: [facility] ORS;  Service: General;  Laterality: N/A;  . Colostomy closure  05/17/2012    Procedure: COLOSTOMY CLOSURE;  Surgeon: Adolph Pollack, MD;  Location: [facility];  Service: General;  Laterality: N/A;  . Laparotomy  05/17/2012    Procedure: EXPLORATORY LAPAROTOMY;  Surgeon: Adolph Pollack, MD;  Location: [facility];  Service: General;;  . Lysis of adhesion  05/17/2012    Procedure: LYSIS OF ADHESION;  Surgeon: Adolph Pollack, MD;  Location:  [facility];  Service: General;;  . Esophageal biopsy  05/17/2012    Procedure: BIOPSY;  Surgeon: Adolph Pollack, MD;  Location: [facility];  Service: General;;  omental biopsy  . Laparotomy  05/22/2012    Procedure: EXPLORATORY LAPAROTOMY;  Surgeon: Adolph Pollack, MD;  Location: [facility];  Service: General;  Laterality: N/A;  DRAINAGE  INTRA-ABDOMINAL ABSCESS/LYSIS OF ADHESIONSFOR SMALL BOWEL OBSTRUCTION/DIVERTING LOOP ILEOSTOMY    Family History  Problem Relation Age of Onset  . Stroke Father   . Atrial fibrillation Father   . Hypertension Mother     Social History History  Substance Use Topics  . Smoking status: Current Some Day Smoker -- 0.25 packs/day for 20 years    Types: Cigarettes  . Smokeless tobacco: Never Used  . Alcohol Use: Yes     Comment: 1 - 2 glasses  of wine per week    Allergies  Allergen Reactions  . Vancomycin Hives and Rash    ? wheezing  . Codeine Nausea And Vomiting  . Tetanus Toxoids Other (See Comments)    serum  . Penicillins Hives and Rash  . Xarelto (Rivaroxaban) Hives and Rash    ?    Current Outpatient Prescriptions  Medication Sig Dispense Refill  . aspirin 325 MG EC tablet Take 325 mg by mouth daily.      . bisoprolol (ZEBETA) 5 MG tablet Take 5 mg by mouth daily before breakfast.      . buPROPion (WELLBUTRIN XL) 300 MG 24 hr tablet Take 300 mg by mouth daily before breakfast.       . celecoxib (CELEBREX) 200 MG capsule Take 200 mg by mouth every morning.      . diphenoxylate-atropine (LOMOTIL) 2.5-0.025 MG per tablet       . esomeprazole (NEXIUM) 40 MG capsule Take 40 mg by mouth every morning.       Marland Kitchen estradiol (ESTRACE) 2 MG tablet Take 2 mg by mouth daily before breakfast.       . furosemide (LASIX) 40 MG tablet       . HYDROcodone-acetaminophen (NORCO/VICODIN) 5-325 MG per tablet Take 1 tablet by mouth as needed for pain.  30 tablet  1  . hydrocortisone cream 0.5 % Apply topically as needed. As needed to occasional hives on back      . loratadine-pseudoephedrine (CLARITIN-D 12-HOUR) 5-120 MG per tablet Take 1 tablet by mouth 2 (two) times daily.      . magnesium oxide (MAG-OX) 400 (241.3 MG) MG tablet Take 2 tablets (800 mg total) by mouth daily with lunch.  60 tablet  1  . montelukast (SINGULAIR) 10 MG tablet Take 10 mg by mouth as needed. For breathing nasal drainage congestion relief      . nystatin (MYCOSTATIN) powder Apply topically as needed. To area by left side by breast      . ondansetron (ZOFRAN) 4 MG tablet Take 1 tablet (4 mg total) by mouth every 4 (four) hours as needed. nausea  30 tablet  1  . cholestyramine (QUESTRAN) 4 G packet Take 1 packet by mouth 2 (two) times daily with a meal.  60 each  12   No current facility-administered medications for this visit.    Review of Systems Review of  Systems  Neurological: Positive for weakness.    Blood pressure 128/76, pulse 86, temperature 99.4 F (37.4 C), temperature source Temporal, resp. rate 20, height 5\' 5"  (1.651 m), weight 147 lb 9.6 oz (66.951 kg).  Physical Exam  Physical Exam  Constitutional:  Thin female  Abdominal: Soft. There is no tenderness.  Right upper quadrant loop ileostomy draining greenish liquid stool. Open mid abdominal wound is clean. I debrided a small amount of mesh from this. Wound is repacked.    Data Reviewed Lab reports and old notes the  Assessment    Still deconditioned. Having hypomagnesemia and hypocalcemia. Still with significant ileostomy output. She seems to be staying hydrated now.     Plan    I encouraged her to take a magnesium twice a day. I told her to take 3 TUMS 3 times a day. I prescribed some Questran to be used twice a day to help with ileostomy output. We'll recheck labs next week. Return visit 2 weeks.        Oletta Buehring J 07/17/2012, 4:31 PM

## 2012-07-18 ENCOUNTER — Telehealth (INDEPENDENT_AMBULATORY_CARE_PROVIDER_SITE_OTHER): Payer: Self-pay

## 2012-07-18 ENCOUNTER — Other Ambulatory Visit (INDEPENDENT_AMBULATORY_CARE_PROVIDER_SITE_OTHER): Payer: Self-pay | Admitting: General Surgery

## 2012-07-18 ENCOUNTER — Encounter (INDEPENDENT_AMBULATORY_CARE_PROVIDER_SITE_OTHER): Payer: Self-pay | Admitting: General Surgery

## 2012-07-18 NOTE — Telephone Encounter (Signed)
LMOV for pt to call this morning regarding her labs.  Needs to speak with [facility].

## 2012-07-18 NOTE — Telephone Encounter (Signed)
Discussed labs with patient.  Advised her that her Calcium is still low.  Dr. Abbey Chatters has instructed her to take (3) Tums 4 x qd, and she is to have her Ca level drawn tomorrow.  We will call her with those results.  She will still need to have her CMET and Mg drawn next week.  Pt understands all instructions.

## 2012-07-18 NOTE — Progress Notes (Signed)
Patient ID: [name], female   DOB: 26-Feb-1951, 62 y.o.   MRN: [id number] I spoke with her today regarding her calcium level. Her albumin came back today at 3.5. Thus her corrected calcium is in the low 6 range. She's not having paresthesias. She has been taking her TUMS. I requested that she get her blood drawn early tomorrow morning and we will check her calcium level. If she is not responding appropriately to this aggressive oral replacement, I told her I may need to put her in the hospital for IV replacement.

## 2012-07-20 ENCOUNTER — Encounter (INDEPENDENT_AMBULATORY_CARE_PROVIDER_SITE_OTHER): Payer: Self-pay | Admitting: General Surgery

## 2012-07-20 ENCOUNTER — Other Ambulatory Visit (INDEPENDENT_AMBULATORY_CARE_PROVIDER_SITE_OTHER): Payer: Self-pay | Admitting: General Surgery

## 2012-07-20 ENCOUNTER — Encounter (HOSPITAL_COMMUNITY): Payer: Self-pay

## 2012-07-20 ENCOUNTER — Ambulatory Visit (HOSPITAL_COMMUNITY)
Admission: RE | Admit: 2012-07-20 | Discharge: 2012-07-20 | Disposition: A | Discharge status: Home / Self Care | Payer: BC Managed Care – PPO | Source: Ambulatory Visit | Attending: General Surgery | Admitting: General Surgery

## 2012-07-20 DIAGNOSIS — E878 Other disorders of electrolyte and fluid balance, not elsewhere classified: Secondary | ICD-10-CM | POA: Insufficient documentation

## 2012-07-20 MED ORDER — MAGNESIUM SULFATE 40 MG/ML IJ SOLN
2.0000 g | Freq: Once | INTRAMUSCULAR | Status: AC
  Administered 2012-07-20: 2 g via INTRAVENOUS
  Filled 2012-07-20: qty 50

## 2012-07-20 MED ORDER — SODIUM CHLORIDE 0.9 % IV SOLN
1.0000 g | INTRAVENOUS | Status: AC
  Administered 2012-07-20 (×4): 1 g via INTRAVENOUS
  Filled 2012-07-20 (×4): qty 10

## 2012-07-20 MED ORDER — MAGNESIUM SULFATE 50 % IJ SOLN: 2.0000 g | Freq: Once | INTRAVENOUS | Status: DC

## 2012-07-20 MED ORDER — CALCIUM GLUCONATE 10 % IV SOLN: 1.0000 g | INTRAVENOUS | Status: AC

## 2012-07-20 MED ORDER — SODIUM CHLORIDE 0.9 % IV SOLN
INTRAVENOUS | Status: DC
  Administered 2012-07-20: 14:00:00 via INTRAVENOUS

## 2012-07-20 NOTE — Progress Notes (Unsigned)
Patient ID: [name], female   DOB: [birth year], 62 y.o.   MRN: [id number] Her repeat calcium level was up to 5.9 after she started TUMS.  I recommended she either be admitted to the hospital for IV supplementation or come to Short Stay at [facility] where she can be given IV calcium and magnesium.  She would rather be treated as an outpatient so will make arrangements with short stay.

## 2012-07-20 NOTE — Progress Notes (Signed)
Patient received IV Calcium and Magnesium today in Short Stay as ordered. Tolerated well. Home with sister at 80.

## 2012-07-23 ENCOUNTER — Encounter: Payer: Self-pay | Admitting: Oncology

## 2012-07-23 ENCOUNTER — Other Ambulatory Visit: Payer: Self-pay | Admitting: Lab

## 2012-07-23 NOTE — Progress Notes (Signed)
  This encounter was created in error - please disregard.  No show for visit today. POF to schedule to reschedule appt.

## 2012-07-24 ENCOUNTER — Telehealth: Payer: Self-pay | Admitting: Oncology

## 2012-07-24 NOTE — Telephone Encounter (Signed)
lvm for pt regarding to May 5th appt

## 2012-07-28 LAB — BASIC METABOLIC PANEL
BUN: 9 mg/dL (ref 6–23)
Chloride: 106 mEq/L (ref 96–112)
Potassium: 4.3 mEq/L (ref 3.5–5.3)
Sodium: 137 mEq/L (ref 135–145)

## 2012-07-30 ENCOUNTER — Encounter (INDEPENDENT_AMBULATORY_CARE_PROVIDER_SITE_OTHER): Payer: Self-pay | Admitting: General Surgery

## 2012-07-30 ENCOUNTER — Ambulatory Visit (INDEPENDENT_AMBULATORY_CARE_PROVIDER_SITE_OTHER): Payer: BC Managed Care – PPO | Admitting: General Surgery

## 2012-07-30 ENCOUNTER — Other Ambulatory Visit (INDEPENDENT_AMBULATORY_CARE_PROVIDER_SITE_OTHER): Payer: Self-pay

## 2012-07-30 ENCOUNTER — Other Ambulatory Visit (INDEPENDENT_AMBULATORY_CARE_PROVIDER_SITE_OTHER): Payer: Self-pay | Admitting: General Surgery

## 2012-07-30 VITALS — BP 122/74 | HR 82 | Temp 98.3°F | Resp 18 | Ht 65.0 in | Wt 151.8 lb

## 2012-07-30 DIAGNOSIS — Z9889 Other specified postprocedural states: Secondary | ICD-10-CM

## 2012-07-30 MED ORDER — DIPHENOXYLATE-ATROPINE 2.5-0.025 MG PO TABS: 1.0000 | ORAL_TABLET | Freq: Four times a day (QID) | ORAL | Status: DC | PRN

## 2012-07-30 NOTE — Patient Instructions (Signed)
Take 2 magnesium tablets three times a day.  Continue current TUMS regimen.

## 2012-07-30 NOTE — Progress Notes (Signed)
Subjective:     Patient ID: [name], female   DOB: Apr 11, 1951, 62 y.o.   MRN: [id number]  HPI   She is here for another postoperative visit. Also to followup on hypocalcemia and hypomagnesemia. She feels better after the infusions last week.  Her calcium is up to 7.4. Her magnesium is up to 0.9. She feels stronger. She had some broccoli cheese soup over the weekend and this led her to have significantly increased ileostomy output.   Review of Systems     Objective:   Physical Exam Gen.-she looks well and is in no acute distress.  Abdomen-open wound has good granulation tissue and is slowly closing and. Loop ileostomy looks good with pasty output.    Assessment:     She is slowly improving. Electrolyte anomalies have improved.     Plan:     Increase magnesium to 3 times a day. Continue current TUMS regimen. I refilled her Norco and her Lomotil. Return visit 2 weeks. Continue current wound care.

## 2012-08-01 ENCOUNTER — Other Ambulatory Visit (INDEPENDENT_AMBULATORY_CARE_PROVIDER_SITE_OTHER): Payer: Self-pay | Admitting: General Surgery

## 2012-08-02 ENCOUNTER — Telehealth (INDEPENDENT_AMBULATORY_CARE_PROVIDER_SITE_OTHER): Payer: Self-pay | Admitting: General Surgery

## 2012-08-02 MED ORDER — DIPHENOXYLATE-ATROPINE 2.5-0.025 MG PO TABS: 1.0000 | ORAL_TABLET | Freq: Four times a day (QID) | ORAL | Status: DC | PRN

## 2012-08-02 NOTE — Telephone Encounter (Signed)
Patient called stating the pharmacy never received her lomotil that was prescribed earlier this week. Looking at the report it states it was printed. I tried to e-scribe again and it would not go through. Lomotil #30 called to [facility] on [address]. Patient to call with any other questions.

## 2012-08-11 LAB — BASIC METABOLIC PANEL
Calcium: 8.6 mg/dL (ref 8.4–10.5)
Glucose, Bld: 85 mg/dL (ref 70–99)
Potassium: 4.4 mEq/L (ref 3.5–5.3)
Sodium: 135 mEq/L (ref 135–145)

## 2012-08-11 LAB — MAGNESIUM: Magnesium: 1 mg/dL — ABNORMAL LOW (ref 1.5–2.5)

## 2012-08-14 ENCOUNTER — Encounter (INDEPENDENT_AMBULATORY_CARE_PROVIDER_SITE_OTHER): Payer: Self-pay | Admitting: General Surgery

## 2012-08-14 ENCOUNTER — Other Ambulatory Visit (INDEPENDENT_AMBULATORY_CARE_PROVIDER_SITE_OTHER): Payer: Self-pay | Admitting: General Surgery

## 2012-08-14 ENCOUNTER — Ambulatory Visit (INDEPENDENT_AMBULATORY_CARE_PROVIDER_SITE_OTHER): Payer: BC Managed Care – PPO | Admitting: General Surgery

## 2012-08-14 ENCOUNTER — Telehealth (INDEPENDENT_AMBULATORY_CARE_PROVIDER_SITE_OTHER): Payer: Self-pay

## 2012-08-14 VITALS — BP 158/100 | HR 78 | Resp 20 | Ht 65.0 in | Wt 152.6 lb

## 2012-08-14 DIAGNOSIS — Z9889 Other specified postprocedural states: Secondary | ICD-10-CM

## 2012-08-14 DIAGNOSIS — S31109A Unspecified open wound of abdominal wall, unspecified quadrant without penetration into peritoneal cavity, initial encounter: Secondary | ICD-10-CM

## 2012-08-14 NOTE — Telephone Encounter (Signed)
LMOV 2 wk f/u 5/15 at 2:40pm.

## 2012-08-14 NOTE — Patient Instructions (Signed)
Restart Zebeta and keep track of your blood pressure.  Make an appointment to see Dr. Patty Sermons.

## 2012-08-14 NOTE — Progress Notes (Signed)
Subjective:     Patient ID: [name], female   DOB: Jul 15, 1950, 62 y.o.   MRN: [id number]  HPI  She is here for another postoperative visit and wound check. She feels she turned the corner so to speak last week. She's feeling better. A little bit of nausea today. She has been off her blood pressure medications. Her blood pressure was elevated some today. She's getting better control of her ileostomy output using Lomotil and Imodium. Her calcium is within normal limits. Her magnesium is up to 1.0.   Review of Systems She feels stronger. Her ileostomy output is more pasty.     Objective:   Physical Exam Gen.-she looks better overall.  Abdomen-soft, ileostomy with the brown thick output. Midline wound with good granulation tissue forming and slowly closing and.    Assessment:     Improving overall. Wound healing well by secondary intention. She may be a candidate for negative pressure wound therapy. Does have some hypertension today. I told her she should get an appointment with Dr. Patty Sermons.    Plan:     I told her to go back on her Zebeta.  I will request a home health nurse come out and evaluate her for negative pressure wound treatment. Continue calcium and magnesium supplementation. Return visit 2 weeks.

## 2012-08-15 ENCOUNTER — Other Ambulatory Visit: Payer: Self-pay | Admitting: *Deleted

## 2012-08-15 ENCOUNTER — Telehealth (INDEPENDENT_AMBULATORY_CARE_PROVIDER_SITE_OTHER): Payer: Self-pay

## 2012-08-15 ENCOUNTER — Telehealth (INDEPENDENT_AMBULATORY_CARE_PROVIDER_SITE_OTHER): Payer: Self-pay | Admitting: General Surgery

## 2012-08-15 MED ORDER — BISOPROLOL FUMARATE 5 MG PO TABS: 5.0000 mg | ORAL_TABLET | Freq: Every day | ORAL | Status: DC

## 2012-08-15 NOTE — Telephone Encounter (Signed)
Patient called into office requesting a month supply of Zebeta for her blood pressure until she can get an appointment with Dr. Patty Sermons.  O reviewed with Dr. Abbey Chatters and he recommends patient calling Dr. Yevonne Pax office and have them refill her medication.  Patient understood and will call Dr. Patty Sermons and request a refill.

## 2012-08-17 ENCOUNTER — Encounter: Payer: Self-pay | Admitting: Cardiology

## 2012-08-17 ENCOUNTER — Ambulatory Visit (INDEPENDENT_AMBULATORY_CARE_PROVIDER_SITE_OTHER): Payer: BC Managed Care – PPO | Admitting: Cardiology

## 2012-08-17 VITALS — BP 138/76 | HR 68 | Ht 65.0 in | Wt 151.8 lb

## 2012-08-17 DIAGNOSIS — I119 Hypertensive heart disease without heart failure: Secondary | ICD-10-CM

## 2012-08-17 DIAGNOSIS — I4891 Unspecified atrial fibrillation: Secondary | ICD-10-CM

## 2012-08-17 DIAGNOSIS — I480 Paroxysmal atrial fibrillation: Secondary | ICD-10-CM

## 2012-08-17 DIAGNOSIS — E7800 Pure hypercholesterolemia, unspecified: Secondary | ICD-10-CM

## 2012-08-17 MED ORDER — BISOPROLOL FUMARATE 5 MG PO TABS: 5.0000 mg | ORAL_TABLET | Freq: Every day | ORAL | Status: DC

## 2012-08-17 NOTE — Assessment & Plan Note (Signed)
The patient previously had had a problem with hypercholesterolemia.  Presently she is off cholesterol-lowering medication because she has been in a malnourished state following her surgery.

## 2012-08-17 NOTE — Assessment & Plan Note (Signed)
The patient has had no recurrence of her atrial fibrillation since discharge from the hospital.  She is not a Coumadin candidate because of her multiple comorbidities.  she does take a 325 mg aspirin daily.

## 2012-08-17 NOTE — Assessment & Plan Note (Signed)
Since her surgery her blood pressure has been low and only recently we have restarted her beta blocker in the form of bisoprolol 5 mg one daily.  This is her only present blood pressure medication and her blood pressures are satisfactory.

## 2012-08-17 NOTE — Patient Instructions (Addendum)
Your physician recommends that you continue on your current medications as directed. Please refer to the Current Medication list given to you today.  Your physician recommends that you schedule a follow-up appointment in: 3 month ov/ekg 

## 2012-08-17 NOTE — Progress Notes (Signed)
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 In early 2013 she was hospitalized on the medical service after presenting with sudden onset of severe diarrhea associated with syncope and hypotension. During the course of that hospitalization she underwent colonoscopy which showed ischemic colitis. Also during that hospital stay she also had episodes of paroxysmal atrial fibrillation which responded to beta blocker. During that hospital stay she had an echocardiogram showing normal LV systolic function and normal left atrial size. Because of her past history of anemia and multiple comorbidities she was not placed on warfarin. She has a past history of small bowel obstruction. Her surgeon is Dr. Abbey Chatters.  She has multiple medical issues which are as outlined below. These include PAF, ischemic colitis, depression, HTN, and tobacco abuse. She has a normal EF per echo from earlier this year.  In September 2013 she was admitted at [facility] with another attack of ischemic colitis. She ended up having colon surgery with a colostomy placed. She had PAF while hospitalized. Also with MRSA from a PICC line. She was quite volume overloaded. We put her on Lasix when she returned to [place].  She subsequently had exploratory abdominal surgery here in [place] and has had a prolonged post hospital course but finally appears to be turning the corner.  During her postoperative period she suffered from delirium secondary to Ativan and she also had atrial fibrillation with rapid ventricular response.  She subsequently sinus and has remained in normal sinus rhythm low dose beta blocker in the form of bisoprolol 5 mg daily.  Current Outpatient Prescriptions  Medication Sig Dispense  Refill  . aspirin 325 MG EC tablet Take 325 mg by mouth daily.      . bisoprolol (ZEBETA) 5 MG tablet Take 1 tablet (5 mg total) by mouth daily before breakfast.  90 tablet  3  . buPROPion (WELLBUTRIN XL) 300 MG 24 hr tablet Take 300 mg by mouth daily before breakfast.       . celecoxib (CELEBREX) 200 MG capsule Take 200 mg by mouth every morning.      . diphenoxylate-atropine (LOMOTIL) 2.5-0.025 MG per tablet Take 1 tablet by mouth 4 (four) times daily as needed for diarrhea or loose stools.  30 tablet  2  . esomeprazole (NEXIUM) 40 MG capsule Take 40 mg by mouth every morning.       Marland Kitchen estradiol (ESTRACE) 2 MG tablet Take 2 mg by mouth daily before breakfast.       . furosemide (LASIX) 40 MG tablet       . HYDROcodone-acetaminophen (NORCO/VICODIN) 5-325 MG per tablet Take 1 tablet by mouth as needed for pain.  30 tablet  1  . hydrocortisone cream 0.5 % Apply topically as needed. As needed to occasional hives on back      . Loperamide HCl (IMODIUM A-D PO) Take by mouth 3 (three) times daily.      Marland Kitchen loratadine-pseudoephedrine (CLARITIN-D 12-HOUR) 5-120 MG per tablet Take 1 tablet by mouth as needed.       . magnesium oxide (MAG-OX) 400 (241.3 MG) MG tablet Take 2 tablets (800 mg total) by mouth daily with lunch.  60 tablet  1  . montelukast (SINGULAIR) 10 MG tablet Take 10 mg by mouth as  needed. For breathing nasal drainage congestion relief      . nystatin (MYCOSTATIN) powder Apply topically as needed. To area by left side by breast      . ondansetron (ZOFRAN) 4 MG tablet Take 1 tablet (4 mg total) by mouth every 4 (four) hours as needed. nausea  30 tablet  1   No current facility-administered medications for this visit.    Allergies  Allergen Reactions  . Vancomycin Hives and Rash    ? wheezing  . Ativan (Lorazepam)     confusion  . Codeine Nausea And Vomiting  . Tetanus Toxoids Other (See Comments)    serum  . Penicillins Hives and Rash  . Xarelto (Rivaroxaban) Hives and Rash    ?      Patient Active Problem List   Diagnosis Date Noted  . Hypocalcemia 07/17/2012  . Hypomagnesemia 06/27/2012  . Open abdominal wall wound 06/20/2012  . Ileostomy status 06/20/2012  . Elevated liver function tests-chronic 05/17/2012  . Ischemic colitis 01/31/2012  . 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 Osteoarthritis of hip 10/27/2011  . Postoperative anemia due to acute blood loss 10/27/2011  . Chronic hyponatremia 10/27/2011  . WBC disease 10/27/2011  . Ileus 10/27/2011  . Partial small bowel obstruction 07/05/2011  . Atrial fibrillation 07/05/2011  . Chronic ischemic colitis 07/05/2011  . Hypotension 07/05/2011    Class: Acute  . Atrial fibrillation with rapid ventricular response 05/09/2011  . Chronic back pain 05/08/2011  . Diarrhea 05/07/2011  . Dehydration 05/07/2011  . Benign hypertensive heart disease without heart failure 12/15/2010  . Pure hypercholesterolemia 12/15/2010  . Anemia 12/15/2010    History  Smoking status  . Current Some Day Smoker -- 0.25 packs/day for 20 years  . Types: Cigarettes  Smokeless tobacco  . Never Used    History  Alcohol Use  . Yes    Comment: 1 - 2 glasses of wine per week    Family History  Problem Relation Age of Onset  . Stroke Father   . Atrial fibrillation Father   . Hypertension Mother     Review of Systems: Constitutional: no fever chills diaphoresis or fatigue or change in weight.  Head and neck: no hearing loss, no epistaxis, no photophobia or visual disturbance. Respiratory: No cough, shortness of breath or wheezing. Cardiovascular: No chest pain peripheral edema, palpitations. Gastrointestinal: No abdominal distention, no abdominal pain, no change in bowel habits hematochezia or melena. Genitourinary: No dysuria, no frequency, no urgency, no nocturia. Musculoskeletal:No arthralgias, no back pain, no gait disturbance or myalgias. Neurological: No dizziness, no headaches, no numbness, no  seizures, no syncope, no weakness, no tremors. Hematologic: No lymphadenopathy, no easy bruising. Psychiatric: No confusion, no hallucinations, no sleep disturbance.    Physical Exam: Filed Vitals:   08/17/12 1415  BP: 138/76  Pulse: 68  the general appearance reveals a well-developed well-nourished woman in no distress.The head and neck exam reveals pupils equal and reactive.  Extraocular movements are full.  There is no scleral icterus.  The mouth and pharynx are normal.  The neck is supple.  The carotids reveal no bruits.  The jugular venous pressure is normal.  The  thyroid is not enlarged.  There is no lymphadenopathy.  The chest is clear to percussion and auscultation.  There are no rales or rhonchi.  Expansion of the chest is symmetrical.  The precordium is quiet.  The first heart sound is normal.  The second heart sound is physiologically split.  There  is no murmur gallop rub or click.  There is no abnormal lift or heave.  The abdomen is soft and nontender.  The bowel sounds are normal.  The liver and spleen are not enlarged.  There are no abdominal masses.  There are no abdominal bruits.  Extremities reveal good pedal pulses.  There is no phlebitis or edema.  There is no clubbing.  Strength is normal and symmetrical in all extremities.  There is no lateralizing weakness.  There are no sensory deficits.  The skin of her lower extremities reveal acrocyanosis     Assessment / Plan:  continue same medication.  Recheck in 3 months for office visit and EKG

## 2012-08-20 ENCOUNTER — Telehealth: Payer: Self-pay | Admitting: Oncology

## 2012-08-20 ENCOUNTER — Ambulatory Visit: Payer: BC Managed Care – PPO | Admitting: Oncology

## 2012-08-20 ENCOUNTER — Other Ambulatory Visit (INDEPENDENT_AMBULATORY_CARE_PROVIDER_SITE_OTHER): Payer: Self-pay | Admitting: General Surgery

## 2012-08-20 ENCOUNTER — Other Ambulatory Visit: Payer: BC Managed Care – PPO | Admitting: Lab

## 2012-08-24 ENCOUNTER — Telehealth (INDEPENDENT_AMBULATORY_CARE_PROVIDER_SITE_OTHER): Payer: Self-pay

## 2012-08-24 ENCOUNTER — Other Ambulatory Visit (INDEPENDENT_AMBULATORY_CARE_PROVIDER_SITE_OTHER): Payer: Self-pay | Admitting: General Surgery

## 2012-08-24 ENCOUNTER — Other Ambulatory Visit (INDEPENDENT_AMBULATORY_CARE_PROVIDER_SITE_OTHER): Payer: Self-pay

## 2012-08-24 DIAGNOSIS — R197 Diarrhea, unspecified: Secondary | ICD-10-CM

## 2012-08-24 MED ORDER — DIPHENOXYLATE-ATROPINE 2.5-0.025 MG PO TABS: ORAL_TABLET | ORAL | Status: DC

## 2012-08-24 NOTE — Telephone Encounter (Signed)
Message left on pharmacy VM to refill Lomotil 2.5-0.025 mg tablets #40 with no additional refills.

## 2012-08-30 ENCOUNTER — Ambulatory Visit (INDEPENDENT_AMBULATORY_CARE_PROVIDER_SITE_OTHER): Payer: BC Managed Care – PPO | Admitting: General Surgery

## 2012-08-30 ENCOUNTER — Encounter (INDEPENDENT_AMBULATORY_CARE_PROVIDER_SITE_OTHER): Payer: Self-pay | Admitting: General Surgery

## 2012-08-30 VITALS — BP 134/86 | HR 70 | Temp 96.6°F | Ht 65.0 in | Wt 146.0 lb

## 2012-08-30 DIAGNOSIS — S31109A Unspecified open wound of abdominal wall, unspecified quadrant without penetration into peritoneal cavity, initial encounter: Secondary | ICD-10-CM

## 2012-08-30 DIAGNOSIS — S31109D Unspecified open wound of abdominal wall, unspecified quadrant without penetration into peritoneal cavity, subsequent encounter: Secondary | ICD-10-CM

## 2012-08-30 NOTE — Patient Instructions (Signed)
Continue current wound care

## 2012-08-30 NOTE — Progress Notes (Signed)
Subjective:     Patient ID: [name], female   DOB: 1950/12/09, 62 y.o.   MRN: [id number]  HPI  She is here for another wound check.  She did some research on the [facility] and decided she did not want to try it.  She has persistent bronchitis and is concerned she may have pneumonia.  Review of Systems She feels stronger.     Objective:   Physical Exam Gen.-NAD  Abdomen-soft, ileostomy with the brown thick output. Midline wound with good granulation tissue forming and slowly closing.  Exposed sutures and mesh were debrided.    Assessment:     Improving overall. Wound healing well by secondary intention. She has persistent bronchitis the    Plan:     Continue current wound care.  I suggested she see Dr. Patty Sermons to be evaluated for possible pneumonia. Return visit 2 weeks.

## 2012-09-04 ENCOUNTER — Other Ambulatory Visit (INDEPENDENT_AMBULATORY_CARE_PROVIDER_SITE_OTHER): Payer: Self-pay | Admitting: General Surgery

## 2012-09-06 ENCOUNTER — Other Ambulatory Visit (INDEPENDENT_AMBULATORY_CARE_PROVIDER_SITE_OTHER): Payer: Self-pay | Admitting: General Surgery

## 2012-09-19 ENCOUNTER — Ambulatory Visit (INDEPENDENT_AMBULATORY_CARE_PROVIDER_SITE_OTHER): Payer: BC Managed Care – PPO | Admitting: General Surgery

## 2012-09-19 ENCOUNTER — Encounter (INDEPENDENT_AMBULATORY_CARE_PROVIDER_SITE_OTHER): Payer: Self-pay | Admitting: General Surgery

## 2012-09-19 VITALS — BP 138/78 | HR 74 | Temp 97.2°F | Resp 18 | Ht 65.0 in | Wt 148.6 lb

## 2012-09-19 DIAGNOSIS — S31109D Unspecified open wound of abdominal wall, unspecified quadrant without penetration into peritoneal cavity, subsequent encounter: Secondary | ICD-10-CM

## 2012-09-19 DIAGNOSIS — Z5189 Encounter for other specified aftercare: Secondary | ICD-10-CM

## 2012-09-19 NOTE — Progress Notes (Signed)
Subjective:     Patient ID: [name], female   DOB: 1950-08-24, 62 y.o.   MRN: [id number]  HPI  She is here for followup for her open wound and the ileostomy. She's been eating a lot of strawberries and blueberries recently and has had a significant increase in gas output from her ileostomy. Other than that she is doing okay. She had bronchitis has been treated for that.   Review of SystemsNo fevers or chills. Sometimes the wound has a foul odor.     Objective:   Physical Exam Gen.-she looks well and is in no acute distress.  Abdomen-open abdominal wound with granulation tissue and slightly exposed mesh is present. The wound is smaller than last time. Loop ileostomy with some liquid and pasty output as well as gas.    Assessment:     Open abdominal wound healing slowly by secondary intention. She did not want to try and negative pressure therapy. Having increased gas which is most likely secondary to increased blueberry and strawberry intake.     Plan:     I told her she needs to stop strawberries and blueberries to help with the gas or try Gas-X. Continue current wound care. Return visit 3 weeks.

## 2012-09-19 NOTE — Patient Instructions (Signed)
Avoid strawberries and blueberries. May some cheese. Use Gas-X as needed for gas.

## 2012-10-01 ENCOUNTER — Other Ambulatory Visit (INDEPENDENT_AMBULATORY_CARE_PROVIDER_SITE_OTHER): Payer: Self-pay | Admitting: General Surgery

## 2012-10-08 ENCOUNTER — Encounter (INDEPENDENT_AMBULATORY_CARE_PROVIDER_SITE_OTHER): Payer: Self-pay | Admitting: General Surgery

## 2012-10-08 ENCOUNTER — Ambulatory Visit (INDEPENDENT_AMBULATORY_CARE_PROVIDER_SITE_OTHER): Payer: BC Managed Care – PPO | Admitting: General Surgery

## 2012-10-08 VITALS — BP 128/76 | HR 70 | Temp 98.4°F | Resp 16

## 2012-10-08 DIAGNOSIS — R197 Diarrhea, unspecified: Secondary | ICD-10-CM

## 2012-10-08 DIAGNOSIS — S31109D Unspecified open wound of abdominal wall, unspecified quadrant without penetration into peritoneal cavity, subsequent encounter: Secondary | ICD-10-CM

## 2012-10-08 MED ORDER — DIPHENOXYLATE-ATROPINE 2.5-0.025 MG PO TABS: ORAL_TABLET | ORAL | Status: DC

## 2012-10-08 NOTE — Progress Notes (Signed)
Subjective:     Patient ID: [name], female   DOB: 1951/01/19, 62 y.o.   MRN: [id number]  HPI  She is here for followup for her open wound and the ileostomy. She's been eating fewer strawberries and blueberries and has less gas.  She is out of her Lomotil.   Review of Systems    Objective:   Physical Exam Gen.-she looks well and is in no acute distress.  Abdomen-open abdominal wound with granulation tissue and slightly exposed mesh is present. The wound is smaller than last time. Loop ileostomy with some  gas.    Assessment:     Open abdominal wound healing slowly by secondary intention. Gas per ileostomy is a little less.     Plan:     Refill lomotil.  Continue current wound care.  Return in 3 weeks.

## 2012-10-08 NOTE — Patient Instructions (Signed)
Continue current wound care

## 2012-10-10 ENCOUNTER — Other Ambulatory Visit (INDEPENDENT_AMBULATORY_CARE_PROVIDER_SITE_OTHER): Payer: Self-pay | Admitting: General Surgery

## 2012-10-10 ENCOUNTER — Telehealth (INDEPENDENT_AMBULATORY_CARE_PROVIDER_SITE_OTHER): Payer: Self-pay

## 2012-10-10 NOTE — Telephone Encounter (Signed)
Lomotil unable to be e-prescribe.  Called to [employer].

## 2012-10-31 ENCOUNTER — Ambulatory Visit (INDEPENDENT_AMBULATORY_CARE_PROVIDER_SITE_OTHER): Payer: BC Managed Care – PPO | Admitting: General Surgery

## 2012-10-31 DIAGNOSIS — Z5189 Encounter for other specified aftercare: Secondary | ICD-10-CM

## 2012-10-31 DIAGNOSIS — S31109D Unspecified open wound of abdominal wall, unspecified quadrant without penetration into peritoneal cavity, subsequent encounter: Secondary | ICD-10-CM

## 2012-10-31 NOTE — Patient Instructions (Signed)
Continue current dressing changes

## 2012-10-31 NOTE — Progress Notes (Signed)
Subjective:     Patient ID: [name], female   DOB: 24-Mar-1951, 62 y.o.   MRN: [id number]  HPI  She is here for another followup for her open wound and the ileostomy.   She is emptying her ileostomy bag about 8-10 times a day. She is using Imodium and Lomotil.   Review of Systems    Objective:   Physical Exam Gen.-she looks well and is in no acute distress.  Abdomen-open abdominal wound with granulation tissue and slightly exposed mesh is present. The wound is a little smaller than last time. Loop ileostomy with pasty output.   Some of the exposed mesh was sharply debrided.    Assessment:     Open abdominal wound healing slowly by secondary intention. Options would be to continue current dressing changes. If most of the exposed mesh can be removed, then we could consider a VAC or skin graft.  I told her this could take a very long time to heal.     Plan:       Continue current wound care.  Return in 3 weeks.

## 2012-11-19 ENCOUNTER — Encounter: Payer: Self-pay | Admitting: Cardiology

## 2012-11-19 ENCOUNTER — Ambulatory Visit (INDEPENDENT_AMBULATORY_CARE_PROVIDER_SITE_OTHER): Payer: BC Managed Care – PPO | Admitting: Cardiology

## 2012-11-19 VITALS — BP 120/72 | HR 94 | Ht 65.0 in | Wt 149.8 lb

## 2012-11-19 DIAGNOSIS — C9310 Chronic myelomonocytic leukemia not having achieved remission: Secondary | ICD-10-CM

## 2012-11-19 DIAGNOSIS — C9210 Chronic myeloid leukemia, BCR/ABL-positive, not having achieved remission: Secondary | ICD-10-CM

## 2012-11-19 DIAGNOSIS — I4891 Unspecified atrial fibrillation: Secondary | ICD-10-CM

## 2012-11-19 DIAGNOSIS — R05 Cough: Secondary | ICD-10-CM

## 2012-11-19 DIAGNOSIS — R059 Cough, unspecified: Secondary | ICD-10-CM

## 2012-11-19 DIAGNOSIS — I119 Hypertensive heart disease without heart failure: Secondary | ICD-10-CM

## 2012-11-19 MED ORDER — GUAIFENESIN ER 600 MG PO TB12: 1200.0000 mg | ORAL_TABLET | Freq: Two times a day (BID) | ORAL | Status: DC

## 2012-11-19 NOTE — Assessment & Plan Note (Signed)
The patient has not been aware of any cardiac irregularities or recurrent atrial fibrillation.  No TIA symptoms.

## 2012-11-19 NOTE — Assessment & Plan Note (Signed)
Patient has a history of chronic anemia and has a diagnosis of chronic myelomonocytic leukemia followed at the cancer [facility] previously by Dr. Gaylyn Rong.  She has not met her new oncologist yet.

## 2012-11-19 NOTE — Patient Instructions (Addendum)
Your physician recommends that you schedule a follow-up appointment in: 3 months   Your physician has recommended you make the following change in your medication:  You may take otc mucinex 600 BID

## 2012-11-19 NOTE — Assessment & Plan Note (Signed)
Blood pressure was remaining stable on low-dose bisoprolol.  She is not having any symptoms of orthostatic hypotension.

## 2012-11-19 NOTE — Progress Notes (Signed)
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 In early 2013 she was hospitalized on the medical service after presenting with sudden onset of severe diarrhea associated with syncope and hypotension. During the course of that hospitalization she underwent colonoscopy which showed ischemic colitis. Also during that hospital stay she also had episodes of paroxysmal atrial fibrillation which responded to beta blocker. During that hospital stay she had an echocardiogram showing normal LV systolic function and normal left atrial size. Because of her past history of anemia and multiple comorbidities she was not placed on warfarin. She has a past history of small bowel obstruction. Her surgeon is Dr. Abbey Chatters.  She has multiple medical issues which are as outlined below. These include PAF, ischemic colitis, depression, HTN, and tobacco abuse. She has a normal EF per echo from earlier this year.  In September 2013 she was admitted at [facility] with another attack of ischemic colitis. She ended up having colon surgery with a colostomy placed. She had PAF while hospitalized. Also with MRSA from a PICC line. She was quite volume overloaded. We put her on Lasix when she returned to [place].  She subsequently had exploratory abdominal surgery here in [place] and has had a prolonged post hospital course.  During her hospitalization the patient had paroxysmal atrial fibrillation with rapid ventricular response.  She presently is maintaining normal sinus rhythm on low dose beta blocker bisoprolol.  She previously had had problems with fluid overload but now is not experiencing any fluid overload and no longer takes Lasix.   Current Outpatient Prescriptions  Medication Sig  Dispense Refill  . aspirin 325 MG EC tablet Take 325 mg by mouth daily.      . bisoprolol (ZEBETA) 5 MG tablet Take 1 tablet (5 mg total) by mouth daily before breakfast.  90 tablet  3  . buPROPion (WELLBUTRIN XL) 300 MG 24 hr tablet Take 300 mg by mouth daily before breakfast.       . celecoxib (CELEBREX) 200 MG capsule Take 200 mg by mouth every morning.      . cholestyramine (QUESTRAN) 4 G packet       . diphenoxylate-atropine (LOMOTIL) 2.5-0.025 MG per tablet TAKE 1 TABLET BY MOUTH 4 TIMES DAILY AS NEEDED FOR DIARRHEA  40 tablet  0  . esomeprazole (NEXIUM) 40 MG capsule Take 40 mg by mouth every morning.       Marland Kitchen estradiol (ESTRACE) 2 MG tablet Take 2 mg by mouth daily before breakfast.       . HYDROcodone-acetaminophen (NORCO/VICODIN) 5-325 MG per tablet Take 1 tablet by mouth as needed for pain.  30 tablet  1  . hydrocortisone cream 0.5 % Apply topically as needed. As needed to occasional hives on back      . Loperamide HCl (IMODIUM A-D PO) Take by mouth 3 (three) times daily.      Marland Kitchen loratadine-pseudoephedrine (CLARITIN-D 12-HOUR) 5-120 MG per tablet Take 1 tablet by mouth as needed.       . magnesium oxide (MAG-OX) 400 (241.3 MG) MG tablet Take 2 tablets (800 mg total) by mouth daily with lunch.  60 tablet  1  . montelukast (SINGULAIR) 10 MG tablet Take 10 mg by mouth as needed. For breathing nasal drainage congestion  relief      . nystatin (MYCOSTATIN) powder Apply topically as needed. To area by left side by breast      . ondansetron (ZOFRAN) 4 MG tablet take 1 tablet by mouth every 4 hours if needed for nausea  30 tablet  1  . ondansetron (ZOFRAN) 4 MG tablet take 1 tablet by mouth every 4 hours if needed for nausea  30 tablet  1  . furosemide (LASIX) 40 MG tablet       . guaiFENesin (MUCINEX) 600 MG 12 hr tablet Take 2 tablets (1,200 mg total) by mouth 2 (two) times daily.       No current facility-administered medications for this visit.    Allergies  Allergen Reactions  .  Vancomycin Hives and Rash    ? wheezing  . Ativan (Lorazepam)     confusion  . Codeine Nausea And Vomiting  . Tetanus Toxoids Other (See Comments)    serum  . Penicillins Hives and Rash  . Xarelto (Rivaroxaban) Hives and Rash    ?    Patient Active Problem List   Diagnosis Date Noted  . Hypocalcemia 07/17/2012  . Hypomagnesemia 06/27/2012  . Open abdominal wall wound 06/20/2012  . Ileostomy status 06/20/2012  . Elevated liver function tests-chronic 05/17/2012  . Ischemic colitis 01/31/2012  . 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 Osteoarthritis of hip 10/27/2011  . Postoperative anemia due to acute blood loss 10/27/2011  . Chronic hyponatremia 10/27/2011  . WBC disease 10/27/2011  . Ileus 10/27/2011  . Partial small bowel obstruction 07/05/2011  . Atrial fibrillation 07/05/2011  . Chronic ischemic colitis 07/05/2011  . Hypotension 07/05/2011    Class: Acute  . Atrial fibrillation with rapid ventricular response 05/09/2011  . Chronic back pain 05/08/2011  . Diarrhea 05/07/2011  . Dehydration 05/07/2011  . Benign hypertensive heart disease without heart failure 12/15/2010  . Pure hypercholesterolemia 12/15/2010  . Anemia 12/15/2010    History  Smoking status  . Current Some Day Smoker -- 0.25 packs/day for 20 years  . Types: Cigarettes  Smokeless tobacco  . Never Used    History  Alcohol Use  . Yes    Comment: 1 - 2 glasses of wine per week    Family History  Problem Relation Age of Onset  . Stroke Father   . Atrial fibrillation Father   . Hypertension Mother     Review of Systems: Constitutional: no fever chills diaphoresis or fatigue or change in weight.  Head and neck: no hearing loss, no epistaxis, no photophobia or visual disturbance. Respiratory: No cough, shortness of breath or wheezing. Cardiovascular: No chest pain peripheral edema, palpitations. Gastrointestinal: No abdominal distention, no abdominal pain, no change in bowel  habits hematochezia or melena. Genitourinary: No dysuria, no frequency, no urgency, no nocturia. Musculoskeletal:No arthralgias, no back pain, no gait disturbance or myalgias. Neurological: No dizziness, no headaches, no numbness, no seizures, no syncope, no weakness, no tremors. Hematologic: No lymphadenopathy, no easy bruising. Psychiatric: No confusion, no hallucinations, no sleep disturbance.    Physical Exam: Filed Vitals:   11/19/12 1143  BP: 120/72  Pulse: 94   the general appearance reveals a well-developed well-nourished middle-age woman in no distress.The head and neck exam reveals pupils equal and reactive.  Extraocular movements are full.  There is no scleral icterus.  The mouth and pharynx are normal.  The neck is supple.  The carotids reveal no bruits.  The jugular venous pressure is normal.  The  thyroid  is not enlarged.  There is no lymphadenopathy.  The chest  reveals scattered rhonchi bilaterally.  There is no dullness to percussion.  Expansion of the chest is symmetrical.  The precordium is quiet.  The first heart sound is normal.  The second heart sound is physiologically split.  There is no murmur gallop rub or click.  There is no abnormal lift or heave.  The abdomen is soft and nontender.  The bowel sounds are normal.  The liver and spleen are not enlarged.  There are no abdominal masses.  There are no abdominal bruits.  There is an ileostomy present.  Extremities reveal weak pedal pulses.  There is no phlebitis or edema.  There is acrocyanosis and coolness of her feet bilaterally. Strength is normal and symmetrical in all extremities.  There is no lateralizing weakness.  There are no sensory deficits.  The skin is warm and dry.  There is no rash.  EKG shows normal sinus rhythm left axis deviation low-voltage QRS and since the previous tracing of February 2014 her heart rate has slowed   Assessment / Plan: Continue on same medication.  Advised to stop smoking altogether.   Add Mucinex 600 mg twice a day when necessary for loose cough. Recheck in 3 months for followup office visit

## 2012-11-19 NOTE — Assessment & Plan Note (Signed)
Last week the patient had a cough productive of yellowish sputum and put herself on a five-day course of Cipro which she had on hand from a previous illness.  Her cough has improved.  Unfortunately she is still smoking occasional cigarette which I advised her to stop altogether.

## 2012-11-21 ENCOUNTER — Other Ambulatory Visit (INDEPENDENT_AMBULATORY_CARE_PROVIDER_SITE_OTHER): Payer: Self-pay | Admitting: General Surgery

## 2012-11-21 ENCOUNTER — Telehealth (INDEPENDENT_AMBULATORY_CARE_PROVIDER_SITE_OTHER): Payer: Self-pay

## 2012-11-21 ENCOUNTER — Encounter (INDEPENDENT_AMBULATORY_CARE_PROVIDER_SITE_OTHER): Payer: Self-pay | Admitting: General Surgery

## 2012-11-21 ENCOUNTER — Telehealth (INDEPENDENT_AMBULATORY_CARE_PROVIDER_SITE_OTHER): Payer: Self-pay | Admitting: General Surgery

## 2012-11-21 ENCOUNTER — Ambulatory Visit (INDEPENDENT_AMBULATORY_CARE_PROVIDER_SITE_OTHER): Payer: BC Managed Care – PPO | Admitting: General Surgery

## 2012-11-21 VITALS — BP 118/68 | HR 66 | Temp 98.2°F | Resp 15 | Ht 65.0 in | Wt 148.0 lb

## 2012-11-21 DIAGNOSIS — S31109D Unspecified open wound of abdominal wall, unspecified quadrant without penetration into peritoneal cavity, subsequent encounter: Secondary | ICD-10-CM

## 2012-11-21 DIAGNOSIS — T8189XA Other complications of procedures, not elsewhere classified, initial encounter: Secondary | ICD-10-CM

## 2012-11-21 DIAGNOSIS — S31109A Unspecified open wound of abdominal wall, unspecified quadrant without penetration into peritoneal cavity, initial encounter: Secondary | ICD-10-CM

## 2012-11-21 NOTE — Telephone Encounter (Signed)
New referral form for an existing patient of [name] was faxed to them.  Confirmation was received.

## 2012-11-21 NOTE — Progress Notes (Signed)
Subjective:     Patient ID: [name], female   DOB: 08/25/1950, 62 y.o.   MRN: [id number]  HPI  She is here for another followup for her open wound and the ileostomy.   She is still  emptying her ileostomy bag frequently. She is getting over an episode of bronchitis. She did have some bleeding from the right lower quadrant area of the open wound that she controlled with direct pressure last night.   Review of Systems    Objective:   Physical Exam Gen.-she looks well and is in no acute distress.  Abdomen-open abdominal wound with granulation tissue and slightly exposed mesh is present. The wound is a little smaller than last time. Loop ileostomy with pasty output.   Some of the exposed mesh was sharply debrided.    Assessment:     Open abdominal wound healing slowly by secondary intention. I think we now need to consider negative pressure wound therapy. If this does not help with the wound then referral to plastic surgery for consideration of skin grafting would be the next step.     Plan:       Will have home health nurses come out and start negative pressure 1 therapy. She will need Mepitel or white sponge on the bottom and the black sponge on top. This should be changed every Monday Wednesday and Friday. Return visit 3 weeks.

## 2012-11-21 NOTE — Telephone Encounter (Signed)
[facility] needs patient info faxed [phone]

## 2012-11-21 NOTE — Patient Instructions (Addendum)
We will try and negative pressure wound therapy (VAC).

## 2012-11-23 ENCOUNTER — Telehealth (INDEPENDENT_AMBULATORY_CARE_PROVIDER_SITE_OTHER): Payer: Self-pay

## 2012-11-23 NOTE — Telephone Encounter (Signed)
The pt called and stated that she has an area that bled last night.  It ran down her leg when she got up to go to the bathroom.  Dr Abbey Chatters cauterized it the other day.  The home health company said they may not be able to put the wound vac on with bleeding. I paged Dr Abbey Chatters and he said just get [place] to evaluate it and see if they can put the vac on.  I notified the pt.

## 2012-11-29 ENCOUNTER — Encounter (INDEPENDENT_AMBULATORY_CARE_PROVIDER_SITE_OTHER): Payer: Self-pay | Admitting: General Surgery

## 2012-11-29 ENCOUNTER — Ambulatory Visit (INDEPENDENT_AMBULATORY_CARE_PROVIDER_SITE_OTHER): Payer: BC Managed Care – PPO | Admitting: General Surgery

## 2012-11-29 VITALS — BP 130/68 | HR 72 | Temp 98.8°F | Resp 15 | Ht 65.0 in | Wt 147.4 lb

## 2012-11-29 DIAGNOSIS — Z5189 Encounter for other specified aftercare: Secondary | ICD-10-CM

## 2012-11-29 DIAGNOSIS — S31109D Unspecified open wound of abdominal wall, unspecified quadrant without penetration into peritoneal cavity, subsequent encounter: Secondary | ICD-10-CM

## 2012-11-29 NOTE — Progress Notes (Signed)
Subjective:     Patient ID: [name], female   DOB: 1951-03-03, 62 y.o.   MRN: [id number]  HPI  62 yof pt of Dr Arne [place] with long surgical history who has open abdominal wound.  During dressing change yesterday she had a fair amount of bleeding that soaked some sponges.  This has now stopped.  She has no complaints today.  She comes in as the home health nurse asked her to.  Home health has also said they dont' think vac will help.  Review of Systems     Objective:   Physical Exam    open abdominal wound, pink , there is no bleeding at all now and wound base is healthy Assessment:     Open abdominal wound s/p bleeding episode     Plan:     This has happened before requiring cauterization.  There is nothing I can identify today and we redressed this and will just need to monitor.  I don't think vac will be helpful either and may end up needing a stsg. Will f/u with Dr Abbey Chatters in 2 weeks as planned unless needs to return sooner

## 2012-12-12 ENCOUNTER — Telehealth: Payer: Self-pay | Admitting: Hematology and Oncology

## 2012-12-12 NOTE — Telephone Encounter (Signed)
Returned pt's call re r/s appt. Former pt of HH - [facility] asking pt to call me directly as I would need to s/w her before r/s appt.

## 2012-12-13 ENCOUNTER — Ambulatory Visit (INDEPENDENT_AMBULATORY_CARE_PROVIDER_SITE_OTHER): Payer: BC Managed Care – PPO | Admitting: General Surgery

## 2012-12-13 ENCOUNTER — Telehealth: Payer: Self-pay | Admitting: Hematology and Oncology

## 2012-12-13 ENCOUNTER — Encounter (INDEPENDENT_AMBULATORY_CARE_PROVIDER_SITE_OTHER): Payer: Self-pay | Admitting: General Surgery

## 2012-12-13 VITALS — BP 138/72 | HR 78 | Temp 99.0°F | Resp 14 | Ht 65.0 in | Wt 150.8 lb

## 2012-12-13 DIAGNOSIS — S31109D Unspecified open wound of abdominal wall, unspecified quadrant without penetration into peritoneal cavity, subsequent encounter: Secondary | ICD-10-CM

## 2012-12-13 DIAGNOSIS — T8189XA Other complications of procedures, not elsewhere classified, initial encounter: Secondary | ICD-10-CM

## 2012-12-13 MED ORDER — DIPHENOXYLATE-ATROPINE 2.5-0.025 MG PO TABS: ORAL_TABLET | ORAL | Status: DC

## 2012-12-13 NOTE — Patient Instructions (Signed)
Continue current wound care

## 2012-12-13 NOTE — Telephone Encounter (Signed)
Pt called to r/s April appt and was given new appt for lb/CP2 Bertis Ruddy) for 9/23.

## 2012-12-13 NOTE — Progress Notes (Signed)
Subjective:     Patient ID: [name], female   DOB: 1950-07-13, 62 y.o.   MRN: [id number]  HPI  She is here for another followup for her open wound and the ileostomy.   She is still  emptying her ileostomy bag frequently. She is getting over an episode of bronchitis. She continues to have intermittent bleeding from different sites around the wound that respond to direct pressure.  The VAC was not placed because was felt the wound was too shallow.  Review of Systems    Objective:   Physical Exam Gen.-she looks well and is in no acute distress.  Abdomen-open abdominal wound with granulation tissue and slightly exposed mesh is present. There are some scabs that look like these are area of previous bleeding.Some of the exposed mesh was sharply debrided.    Assessment:     Open abdominal wound healing slowly by secondary intention.  Some intermittent bleeding from the wound. The wound is getting a little smaller. We discussed the possibility of skin grafting again nothing more the mesh will need to be debrided.   Plan:      Continue current wound care. Return visit 3 weeks.

## 2012-12-20 ENCOUNTER — Other Ambulatory Visit (INDEPENDENT_AMBULATORY_CARE_PROVIDER_SITE_OTHER): Payer: Self-pay | Admitting: General Surgery

## 2012-12-20 ENCOUNTER — Emergency Department (HOSPITAL_COMMUNITY): Payer: BC Managed Care – PPO

## 2012-12-20 ENCOUNTER — Inpatient Hospital Stay (HOSPITAL_COMMUNITY)
Admission: EM | Admit: 2012-12-20 | Discharge: 2012-12-24 | DRG: 544 | Disposition: A | Discharge status: Home / Self Care | Payer: BC Managed Care – PPO | Attending: Internal Medicine | Admitting: Internal Medicine

## 2012-12-20 ENCOUNTER — Telehealth (INDEPENDENT_AMBULATORY_CARE_PROVIDER_SITE_OTHER): Payer: Self-pay

## 2012-12-20 ENCOUNTER — Encounter (HOSPITAL_COMMUNITY): Payer: Self-pay

## 2012-12-20 DIAGNOSIS — I5033 Acute on chronic diastolic (congestive) heart failure: Principal | ICD-10-CM

## 2012-12-20 DIAGNOSIS — G8929 Other chronic pain: Secondary | ICD-10-CM

## 2012-12-20 DIAGNOSIS — I509 Heart failure, unspecified: Secondary | ICD-10-CM

## 2012-12-20 DIAGNOSIS — I10 Essential (primary) hypertension: Secondary | ICD-10-CM | POA: Diagnosis present

## 2012-12-20 DIAGNOSIS — N179 Acute kidney failure, unspecified: Secondary | ICD-10-CM

## 2012-12-20 DIAGNOSIS — I119 Hypertensive heart disease without heart failure: Secondary | ICD-10-CM

## 2012-12-20 DIAGNOSIS — K551 Chronic vascular disorders of intestine: Secondary | ICD-10-CM

## 2012-12-20 DIAGNOSIS — Z9049 Acquired absence of other specified parts of digestive tract: Secondary | ICD-10-CM

## 2012-12-20 DIAGNOSIS — E236 Other disorders of pituitary gland: Secondary | ICD-10-CM | POA: Diagnosis present

## 2012-12-20 DIAGNOSIS — E871 Hypo-osmolality and hyponatremia: Secondary | ICD-10-CM | POA: Diagnosis present

## 2012-12-20 DIAGNOSIS — Z932 Ileostomy status: Secondary | ICD-10-CM

## 2012-12-20 DIAGNOSIS — F17200 Nicotine dependence, unspecified, uncomplicated: Secondary | ICD-10-CM | POA: Diagnosis present

## 2012-12-20 DIAGNOSIS — S31109A Unspecified open wound of abdominal wall, unspecified quadrant without penetration into peritoneal cavity, initial encounter: Secondary | ICD-10-CM

## 2012-12-20 DIAGNOSIS — C9210 Chronic myeloid leukemia, BCR/ABL-positive, not having achieved remission: Secondary | ICD-10-CM | POA: Diagnosis present

## 2012-12-20 DIAGNOSIS — J9600 Acute respiratory failure, unspecified whether with hypoxia or hypercapnia: Secondary | ICD-10-CM

## 2012-12-20 DIAGNOSIS — R5383 Other fatigue: Secondary | ICD-10-CM

## 2012-12-20 DIAGNOSIS — R0602 Shortness of breath: Secondary | ICD-10-CM

## 2012-12-20 DIAGNOSIS — D649 Anemia, unspecified: Secondary | ICD-10-CM

## 2012-12-20 DIAGNOSIS — I4891 Unspecified atrial fibrillation: Secondary | ICD-10-CM

## 2012-12-20 DIAGNOSIS — C9310 Chronic myelomonocytic leukemia not having achieved remission: Secondary | ICD-10-CM

## 2012-12-20 LAB — CBC WITH DIFFERENTIAL/PLATELET
Basophils Absolute: 0 10*3/uL (ref 0.0–0.1)
Basophils Relative: 0 % (ref 0–1)
Eosinophils Absolute: 0.3 10*3/uL (ref 0.0–0.7)
Eosinophils Relative: 6 % — ABNORMAL HIGH (ref 0–5)
HCT: 21.4 % — ABNORMAL LOW (ref 36.0–46.0)
Hemoglobin: 7.1 g/dL — ABNORMAL LOW (ref 12.0–15.0)
Lymphocytes Relative: 19 % (ref 12–46)
Lymphs Abs: 0.9 10*3/uL (ref 0.7–4.0)
MCH: 32.9 pg (ref 26.0–34.0)
MCHC: 33.2 g/dL (ref 30.0–36.0)
MCV: 99.1 fL (ref 78.0–100.0)
Monocytes Absolute: 0.8 10*3/uL (ref 0.1–1.0)
Monocytes Relative: 18 % — ABNORMAL HIGH (ref 3–12)
Neutro Abs: 2.6 10*3/uL (ref 1.7–7.7)
Neutrophils Relative %: 57 % (ref 43–77)
Platelets: 291 10*3/uL (ref 150–400)
RBC: 2.16 MIL/uL — ABNORMAL LOW (ref 3.87–5.11)
RDW: 15.4 % (ref 11.5–15.5)
WBC: 4.6 10*3/uL (ref 4.0–10.5)

## 2012-12-20 LAB — BASIC METABOLIC PANEL
BUN: 17 mg/dL (ref 6–23)
CO2: 19 mEq/L (ref 19–32)
Calcium: 8.2 mg/dL — ABNORMAL LOW (ref 8.4–10.5)
Chloride: 97 mEq/L (ref 96–112)
Creatinine, Ser: 1.11 mg/dL — ABNORMAL HIGH (ref 0.50–1.10)
GFR calc Af Amer: 60 mL/min — ABNORMAL LOW (ref >90)
GFR calc non Af Amer: 52 mL/min — ABNORMAL LOW (ref >90)
Glucose, Bld: 84 mg/dL (ref 70–99)
Potassium: 4.8 mEq/L (ref 3.5–5.1)
Sodium: 127 mEq/L — ABNORMAL LOW (ref 135–145)

## 2012-12-20 LAB — TROPONIN I: Troponin I: <0.3 ng/mL (ref <0.30)

## 2012-12-20 LAB — MRSA PCR SCREENING: MRSA by PCR: POSITIVE — AB

## 2012-12-20 LAB — PREPARE RBC (CROSSMATCH)

## 2012-12-20 LAB — RETICULOCYTES
RBC.: 2.12 MIL/uL — ABNORMAL LOW (ref 3.87–5.11)
Retic Ct Pct: 4.8 % — ABNORMAL HIGH (ref 0.4–3.1)

## 2012-12-20 MED ORDER — SODIUM CHLORIDE 0.9 % IV BOLUS (SEPSIS)
500.0000 mL | INTRAVENOUS | Status: AC
  Administered 2012-12-20: 500 mL via INTRAVENOUS

## 2012-12-20 MED ORDER — HYDROCODONE-ACETAMINOPHEN 5-325 MG PO TABS
1.0000 | ORAL_TABLET | ORAL | Status: DC | PRN
  Administered 2012-12-20: 2 via ORAL
  Filled 2012-12-20 (×2): qty 2

## 2012-12-20 MED ORDER — SODIUM CHLORIDE 0.9 % IJ SOLN
3.0000 mL | Freq: Two times a day (BID) | INTRAMUSCULAR | Status: DC
  Administered 2012-12-21 – 2012-12-23 (×6): 3 mL via INTRAVENOUS

## 2012-12-20 MED ORDER — MONTELUKAST SODIUM 10 MG PO TABS
10.0000 mg | ORAL_TABLET | Freq: Every day | ORAL | Status: DC | PRN
  Filled 2012-12-20: qty 1

## 2012-12-20 MED ORDER — SODIUM CHLORIDE 0.9 % IJ SOLN
3.0000 mL | INTRAMUSCULAR | Status: DC | PRN
  Administered 2012-12-24 (×2): 3 mL via INTRAVENOUS

## 2012-12-20 MED ORDER — VITAMINS A & D EX OINT
TOPICAL_OINTMENT | CUTANEOUS | Status: DC | PRN
  Filled 2012-12-20: qty 5

## 2012-12-20 MED ORDER — BISOPROLOL FUMARATE 5 MG PO TABS
5.0000 mg | ORAL_TABLET | Freq: Every day | ORAL | Status: DC
  Administered 2012-12-21 – 2012-12-24 (×4): 5 mg via ORAL
  Filled 2012-12-20 (×5): qty 1

## 2012-12-20 MED ORDER — LOPERAMIDE HCL 2 MG PO CAPS
2.0000 mg | ORAL_CAPSULE | Freq: Four times a day (QID) | ORAL | Status: DC | PRN
  Administered 2012-12-20 – 2012-12-23 (×9): 2 mg via ORAL
  Filled 2012-12-20 (×9): qty 1

## 2012-12-20 MED ORDER — BUPROPION HCL ER (XL) 300 MG PO TB24
300.0000 mg | ORAL_TABLET | Freq: Every day | ORAL | Status: DC
  Administered 2012-12-21 – 2012-12-24 (×4): 300 mg via ORAL
  Filled 2012-12-20 (×4): qty 1

## 2012-12-20 MED ORDER — ACETAMINOPHEN 325 MG PO TABS
650.0000 mg | ORAL_TABLET | ORAL | Status: DC | PRN
  Administered 2012-12-20: 650 mg via ORAL
  Filled 2012-12-20: qty 2

## 2012-12-20 MED ORDER — LORATADINE-PSEUDOEPHEDRINE ER 5-120 MG PO TB12: 1.0000 | ORAL_TABLET | Freq: Every day | ORAL | Status: DC | PRN

## 2012-12-20 MED ORDER — SODIUM CHLORIDE 0.9 % IV SOLN: 250.0000 mL | INTRAVENOUS | Status: DC | PRN

## 2012-12-20 MED ORDER — CELECOXIB 200 MG PO CAPS
200.0000 mg | ORAL_CAPSULE | Freq: Every morning | ORAL | Status: DC
  Administered 2012-12-21 – 2012-12-22 (×2): 200 mg via ORAL
  Filled 2012-12-20 (×3): qty 1

## 2012-12-20 MED ORDER — DIPHENOXYLATE-ATROPINE 2.5-0.025 MG PO TABS
1.0000 | ORAL_TABLET | Freq: Four times a day (QID) | ORAL | Status: DC | PRN
  Administered 2012-12-20: 22:00:00 1 via ORAL
  Filled 2012-12-20: qty 1

## 2012-12-20 MED ORDER — PSEUDOEPHEDRINE HCL ER 120 MG PO TB12
120.0000 mg | ORAL_TABLET | Freq: Two times a day (BID) | ORAL | Status: DC
  Administered 2012-12-21 – 2012-12-24 (×7): 120 mg via ORAL
  Filled 2012-12-20 (×9): qty 1

## 2012-12-20 MED ORDER — ESTRADIOL 2 MG PO TABS
2.0000 mg | ORAL_TABLET | Freq: Every day | ORAL | Status: DC
  Administered 2012-12-21 – 2012-12-24 (×4): 2 mg via ORAL
  Filled 2012-12-20 (×4): qty 1

## 2012-12-20 MED ORDER — CHLORHEXIDINE GLUCONATE CLOTH 2 % EX PADS
6.0000 | MEDICATED_PAD | Freq: Every day | CUTANEOUS | Status: DC
  Administered 2012-12-21 – 2012-12-24 (×4): 6 via TOPICAL

## 2012-12-20 MED ORDER — LORATADINE 10 MG PO TABS
10.0000 mg | ORAL_TABLET | Freq: Every day | ORAL | Status: DC
  Administered 2012-12-21 – 2012-12-24 (×4): 10 mg via ORAL
  Filled 2012-12-20 (×4): qty 1

## 2012-12-20 MED ORDER — MUPIROCIN 2 % EX OINT
1.0000 "application " | TOPICAL_OINTMENT | Freq: Two times a day (BID) | CUTANEOUS | Status: DC
  Administered 2012-12-21 – 2012-12-24 (×8): 1 via NASAL
  Filled 2012-12-20: qty 22

## 2012-12-20 MED ORDER — ONDANSETRON HCL 4 MG/2ML IJ SOLN
4.0000 mg | Freq: Four times a day (QID) | INTRAMUSCULAR | Status: DC | PRN
  Administered 2012-12-21 – 2012-12-23 (×2): 4 mg via INTRAVENOUS
  Filled 2012-12-20 (×2): qty 2

## 2012-12-20 MED ORDER — FUROSEMIDE 10 MG/ML IJ SOLN
20.0000 mg | Freq: Two times a day (BID) | INTRAMUSCULAR | Status: DC
  Administered 2012-12-20 – 2012-12-21 (×2): 20 mg via INTRAVENOUS
  Filled 2012-12-20 (×4): qty 2

## 2012-12-20 MED ORDER — TRAMADOL HCL 50 MG PO TABS
50.0000 mg | ORAL_TABLET | Freq: Four times a day (QID) | ORAL | Status: DC | PRN
  Administered 2012-12-21: 50 mg via ORAL
  Filled 2012-12-20: qty 1

## 2012-12-20 MED ORDER — FUROSEMIDE 10 MG/ML IJ SOLN
80.0000 mg | Freq: Once | INTRAMUSCULAR | Status: AC
  Administered 2012-12-20: 80 mg via INTRAVENOUS
  Filled 2012-12-20: qty 8

## 2012-12-20 MED ORDER — PANTOPRAZOLE SODIUM 40 MG PO TBEC
40.0000 mg | DELAYED_RELEASE_TABLET | Freq: Every day | ORAL | Status: DC
  Administered 2012-12-21 – 2012-12-24 (×4): 40 mg via ORAL
  Filled 2012-12-20 (×4): qty 1

## 2012-12-20 MED ORDER — ASPIRIN EC 325 MG PO TBEC
325.0000 mg | DELAYED_RELEASE_TABLET | Freq: Every day | ORAL | Status: DC
  Administered 2012-12-21 – 2012-12-24 (×4): 325 mg via ORAL
  Filled 2012-12-20 (×4): qty 1

## 2012-12-20 MED ORDER — BISMUTH SUBSALICYLATE 262 MG/15ML PO SUSP
30.0000 mL | Freq: Three times a day (TID) | ORAL | Status: DC
  Administered 2012-12-20 – 2012-12-23 (×6): 30 mL via ORAL
  Filled 2012-12-20: qty 236

## 2012-12-20 MED ORDER — PROMETHAZINE HCL 25 MG PO TABS
25.0000 mg | ORAL_TABLET | Freq: Four times a day (QID) | ORAL | Status: DC | PRN
  Administered 2012-12-20 – 2012-12-24 (×6): 25 mg via ORAL
  Filled 2012-12-20 (×6): qty 1

## 2012-12-20 MED ORDER — ENOXAPARIN SODIUM 30 MG/0.3ML ~~LOC~~ SOLN
30.0000 mg | SUBCUTANEOUS | Status: DC
  Administered 2012-12-20: 30 mg via SUBCUTANEOUS
  Filled 2012-12-20 (×3): qty 0.3

## 2012-12-20 NOTE — Progress Notes (Signed)
She is well known to me because of a chronic, slowly healing open wound.  She is profoundly anemic and BNP is significantly elevated as well.  She does have problems with highly ileostomy outputs at times.  Will start Pepto Bismol and write for wound care.  May want inform Dr. Patty Sermons of her admission.

## 2012-12-20 NOTE — Telephone Encounter (Signed)
Advised pt's friend, per Dr. Maris Berger second request, to proceed directly to [facility] ER for evaluation and work-up.  He will f/u up with her there when he is able.

## 2012-12-20 NOTE — H&P (Addendum)
[facility] History and Physical  [name] MWU:[id number] DOB: 1950-08-10 DOA: 12/20/2012  Referring physician: ED physician PCP: Cassell Clement, MD   Chief Complaint: Shortness of breath   HPI:  Pt is 62 yo female who presented to [facility] ED with main concern of progressively worsening shortness of breath that initially started 1-2 weeks prior to admission but has gotten worse over the past 2 days. She reports this initially started with exertion and now present at rest, 2-3 pillow orthopnea and worsening LE swelling. Pt is unaware of specific weight gain. She denies chest pain, no fevers, chills, no specific urinary concerns or abdominal concerns except nausea and poor oral intake. Pt denies cough, no palpitations, no similar events in the past.   In the ED, initial oxygen saturation in high 80's and improved to mid 90's with oxygen, CXR consistent with pulmonary vascular congestion and [facility] asked to admit for further evaluation and management of presumptive CHF exacerbation.   Assessment and Plan:  Principal Problem:   Acute respiratory failure - possibly secondary to pulmonary vascular congestion based on physical exam findings, symptoms, CXR, BNP - 2 D ECHO in 2013 with normal systolic function 60 - 65% and with no evidence of diastolic dysfunction - pt is Lasix at home but takes it as needed only  - will place on Lasix 20 mg BID and will monitor how is pt diuresing and will plan on readjusting the dose as clinically indicated - will monitor daily weights, I's and O's, renal function - 2 D ECHO ordered  Active Problems:   Open abdominal wound - appreciate surgery following - wound care consult ordered   Acute on chronic anemia - unclear etiology - will check FOBT and anemia panel - transfuse 2 U PRBC - last Hg in 06/2012 was ~9   Acute renal failure - last Cr in 07/2012 was within normal limits - will hold off on IVF due to ? CHF - will provide Lasix and will monitor  closely - BMP in AM - strict I's and O's, daily weights   Chronic hyponatremia - appears to be stable and at pt's baseline - monitor closely while pt is on Lasix   Code Status: Full Family Communication: Pt at bedside Disposition Plan: Admit to telemetry bed    Review of Systems:  Constitutional: Negative for fever, chills. Negative for diaphoresis.  HENT: Negative for hearing loss, ear pain, nosebleeds, congestion, sore throat, neck pain, tinnitus and ear discharge.   Eyes: Negative for blurred vision, double vision, photophobia, pain, discharge and redness.  Respiratory: Negative for cough, hemoptysis, sputum production, wheezing and stridor.   Cardiovascular: Negative for chest pain, palpitations, claudication.  Gastrointestinal: Negative for vomiting and abdominal pain. Negative for heartburn, constipation, blood in stool and melena.  Genitourinary: Negative for dysuria, urgency, frequency, hematuria and flank pain.  Musculoskeletal: Negative for myalgias, back pain, joint pain and falls.  Skin: Negative for itching and rash.  Neurological: Negative for tingling, tremors, sensory change, speech change, focal weakness, loss of consciousness and headaches.  Endo/Heme/Allergies: Negative for environmental allergies and polydipsia. Does not bruise/bleed easily.  Psychiatric/Behavioral: Negative for suicidal ideas. The patient is not nervous/anxious.      Past Medical History  Diagnosis Date  . Hypertension     She has a past hx of essential  . 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 Inflammation of joint of  knee     Since we last last saw her she developed problems with an acute which required surgical drainage by her orthopedist Dr. Cleophas Dunker.  Marland Kitchen MRSA (methicillin resistant Staphylococcus aureus)     Knee surgery drainage was positive for MRSA and she was treated with 3 weeks of doxycycline successfully.   . Diarrhea     Mild  . Exogenous obesity   . GERD (gastroesophageal reflux disease)     2 hosp.- ischemic colitis - residual of Norovirus, 05/2011- sm. bowel obstruction  . Headache(784.0)     migraine headache on occas, less now than when she was younger   . Arthritis     L hip, back, neck   . History of blood transfusion sept 2013    04/2011- /w ischemic colitis , trouble with matching blood  sept 2013  . Anemia     will see hematology consult prior to surgery, recommended by Dr. Patty Sermons  . Anemia 12/15/2010  . Ischemic colitis 01/31/2012  . Atrial flutter     during hospitalization, 04/2011- related to anemia & illness/stress   . Pneumonia     04/2011- not hospitalized , pt. denies SOB, changes in chest, breathing  . 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 Dizziness     occasional    Past Surgical History  Procedure Laterality Date  . Knee surgery      I&D- 2008, post laceration   . Colonoscopy  05/16/2011    Procedure: COLONOSCOPY;  Surgeon: Vertell Novak., MD;  Location: [facility];  Service: Endoscopy;  Laterality: N/A;  . Small intestine surgery  1992, 1999  . Laparotomy and lysis of adhesions    . Total hip arthroplasty  10/25/2011    Procedure: TOTAL HIP ARTHROPLASTY;  Surgeon: Valeria Batman, MD;  Location: [facility] OR;  Service: Orthopedics;  Laterality: Left;  . Appendectomy  1962  . Abdominal hysterectomy  1988  . Partial colectomy and colostomy  sept 2013    mucous fistula done  . Partial colectomy  05/17/2012    Procedure: PARTIAL COLECTOMY;  Surgeon: Adolph Pollack, MD;  Location: [facility];  Service: General;  Laterality: N/A;  . Colostomy closure  05/17/2012    Procedure: COLOSTOMY CLOSURE;  Surgeon: Adolph Pollack, MD;  Location: [facility];  Service: General;  Laterality: N/A;  . Laparotomy  05/17/2012    Procedure: EXPLORATORY LAPAROTOMY;  Surgeon: Adolph Pollack, MD;  Location: [facility];  Service: General;;  . Lysis of adhesion  05/17/2012     Procedure: LYSIS OF ADHESION;  Surgeon: Adolph Pollack, MD;  Location: [facility];  Service: General;;  . Esophageal biopsy  05/17/2012    Procedure: BIOPSY;  Surgeon: Adolph Pollack, MD;  Location: [facility];  Service: General;;  omental biopsy  . Laparotomy  05/22/2012    Procedure: EXPLORATORY LAPAROTOMY;  Surgeon: Adolph Pollack, MD;  Location: [facility];  Service: General;  Laterality: N/A;  DRAINAGE  INTRA-ABDOMINAL ABSCESS/LYSIS OF ADHESIONSFOR SMALL BOWEL OBSTRUCTION/DIVERTING LOOP ILEOSTOMY    Social History:  reports that she has been smoking Cigarettes.  She has a 5 pack-year smoking history. She has never used smokeless tobacco. She reports that  drinks alcohol. She reports that she does not use illicit drugs.  Allergies  Allergen Reactions  . Vancomycin Hives and Rash    ? wheezing  . Ativan [Lorazepam]     confusion  . Codeine Nausea And Vomiting  . Tetanus Toxoids Other (See Comments)  serum  . Penicillins Hives and Rash  . Xarelto [Rivaroxaban] Hives and Rash    ?    Family History  Problem Relation Age of Onset  . Stroke Father   . Atrial fibrillation Father   . Hypertension Mother     Prior to Admission medications   Medication Sig Start Date End Date Taking? Authorizing Provider  aspirin 325 MG EC tablet Take 325 mg by mouth daily.   Yes Historical Provider, MD  bisoprolol (ZEBETA) 5 MG tablet Take 1 tablet (5 mg total) by mouth daily before breakfast. 08/17/12  Yes Cassell Clement, MD  buPROPion (WELLBUTRIN XL) 300 MG 24 hr tablet Take 300 mg by mouth daily before breakfast.    Yes Historical Provider, MD  celecoxib (CELEBREX) 200 MG capsule Take 200 mg by mouth every morning.   Yes Historical Provider, MD  diphenoxylate-atropine (LOMOTIL) 2.5-0.025 MG per tablet Take 1 tablet by mouth 4 (four) times daily as needed for diarrhea or loose stools.   Yes Historical Provider, MD  esomeprazole (NEXIUM) 40 MG capsule Take 40 mg by mouth every morning.    Yes  Historical Provider, MD  estradiol (ESTRACE) 2 MG tablet Take 2 mg by mouth daily before breakfast.    Yes Historical Provider, MD  furosemide (LASIX) 40 MG tablet Take 40 mg by mouth daily as needed for fluid.  07/03/12  Yes Historical Provider, MD  loperamide (IMODIUM) 2 MG capsule Take 2 mg by mouth 4 (four) times daily as needed for diarrhea or loose stools.   Yes Historical Provider, MD  loratadine-pseudoephedrine (CLARITIN-D 12-HOUR) 5-120 MG per tablet Take 1 tablet by mouth daily as needed for allergies.    Yes Historical Provider, MD  montelukast (SINGULAIR) 10 MG tablet Take 10 mg by mouth daily as needed (for allergy relief).    Yes Historical Provider, MD  ondansetron (ZOFRAN) 4 MG tablet Take 4 mg by mouth every 8 (eight) hours as needed for nausea.   Yes Historical Provider, MD  promethazine (PHENERGAN) 25 MG tablet Take 25 mg by mouth every 6 (six) hours as needed for nausea.   Yes Historical Provider, MD    Physical Exam: Filed Vitals:   12/20/12 1339 12/20/12 1344 12/20/12 1349 12/20/12 1615  BP: 141/72   137/62  Pulse: 78   95  Temp: 98.4 F (36.9 C)     TempSrc: Oral     Resp: 16   19  SpO2: 88% 88% 96% 97%    Physical Exam  Constitutional: Appears well-developed and well-nourished. No distress.  HENT: Normocephalic. External right and left ear normal. Dry MM Eyes: Conjunctivae and EOM are normal. PERRLA, no scleral icterus.  Neck: Normal ROM. Neck supple. No JVD. No tracheal deviation. No thyromegaly.  CVS: RRR, S1/S2 +, no murmurs, no gallops, no carotid bruit.  Pulmonary: Bibasilar crackles and diminished air movement bilaterally, no wheezing  Abdominal: Open abdominal wound, no tenderness, rebound or guarding.  Musculoskeletal: Normal range of motion. +1 bilateral LE pitting edema and no tenderness.  Lymphadenopathy: No lymphadenopathy noted, cervical, inguinal. Neuro: Alert. Normal reflexes, muscle tone coordination. No cranial nerve deficit. Skin: Skin is warm  and dry. No rash noted. Not diaphoretic. No erythema. No pallor.  Psychiatric: Normal mood and affect. Behavior, judgment, thought content normal.   Labs on Admission:  Basic Metabolic Panel:  Recent Labs Lab 12/20/12 1400  NA 127*  K 4.8  CL 97  CO2 19  GLUCOSE 84  BUN 17  CREATININE 1.11*  CALCIUM 8.2*   CBC:  Recent Labs Lab 12/20/12 1400  WBC 4.6  NEUTROABS 2.6  HGB 7.1*  HCT 21.4*  MCV 99.1  PLT 291   Cardiac Enzymes:  Recent Labs Lab 12/20/12 1522  TROPONINI <0.30   Radiological Exams on Admission: Dg Chest Portable 1 View  12/20/2012   *RADIOLOGY REPORT*  Clinical Data: Weakness, shortness of breath  PORTABLE CHEST - 1 VIEW  Comparison: 05/29/2012  Findings: The cardiac shadow is stable.  The previously seen PICC line has been removed in the interval.  The lungs are well-aerated but demonstrate diffuse patchy changes consistent with pulmonary edema.  No focal confluent infiltrate is seen.  IMPRESSION: Increased vasculature with parenchymal changes consistent with pulmonary edema.   Original Report Authenticated By: Alcide Clever, M.D.    EKG: Normal sinus rhythm, no ST/T wave changes  Debbora Presto, MD  [facility] Pager [phone]  If 7PM-7AM, please contact night-coverage [email or web address] Password [facility] 12/20/2012, 4:41 PM

## 2012-12-20 NOTE — Telephone Encounter (Signed)
Send her for a BMET and CBC.  She may need to get outpatient IVF.

## 2012-12-20 NOTE — ED Notes (Signed)
Pt c/o weakness and SOB x 2 weeks.  Pt sts "I feel this way when my hemoglobin is low."  Sts she called her PCP and he told her to come in.  Sts she is seen at the [facility] for anemia.  NAD noted.

## 2012-12-20 NOTE — Telephone Encounter (Signed)
Pt's friend calling to report that the pt does not feel good today. The pt is white as a ghost,no strength to stand up, not really eating, and ileostomy bag putting out a lot per the friend. The pt is drinking fluids but doesn't have the energy to eat. The pt has had to have blood tranfusions in the past so they would like for the pt to go and get labs drawn on her to see if she needs a transfusion. Please advise.

## 2012-12-20 NOTE — ED Provider Notes (Signed)
CSN: [id number]     Arrival date & time 12/20/12  1322 History   First MD Initiated Contact with Patient 12/20/12 1349     Chief Complaint  Patient presents with  . Weakness  . Shortness of Breath   (Consider location/radiation/quality/duration/timing/severity/associated sxs/prior Treatment) HPI Pt is a 62yo female with hx of CMML (MVS) seen by the cancer center c/o gradually worsening SOB for last 2 weeks associated with generalized weakness and fatigue.  States symptoms increasingly worse over the last 2 days.  States she feels "drained." Called PCP, Dr. Patty Sermons who advised pt to come to ED for further evaluation.  Also reports nausea and worsening chronic neck pain over the last week. Denies fever, chest pain, palpitations, abdominal pain or urinary symptoms.   Past Medical History  Diagnosis Date  . Hypertension     She has a past hx of essential  . 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 Inflammation of joint of knee     Since we last last saw her she developed problems with an acute which required surgical drainage by her orthopedist Dr. Cleophas Dunker.  Marland Kitchen MRSA (methicillin resistant Staphylococcus aureus)     Knee surgery drainage was positive for MRSA and she was treated with 3 weeks of doxycycline successfully.  . Diarrhea     Mild  . Exogenous obesity   . GERD (gastroesophageal reflux disease)     2 hosp.- ischemic colitis - residual of Norovirus, 05/2011- sm. bowel obstruction  . Headache(784.0)     migraine headache on occas, less now than when she was younger   . Arthritis     L hip, back, neck   . History of blood transfusion sept 2013    04/2011- /w ischemic colitis , trouble with matching blood  sept 2013  . Anemia     will see hematology consult prior to surgery, recommended by Dr. Patty Sermons  . Anemia 12/15/2010  . Ischemic colitis 01/31/2012  . Atrial flutter     during hospitalization,  04/2011- related to anemia & illness/stress   . Pneumonia     04/2011- not hospitalized , pt. denies SOB, changes in chest, breathing  . 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 Dizziness     occasional   Past Surgical History  Procedure Laterality Date  . Knee surgery      I&D- 2008, post laceration   . Colonoscopy  05/16/2011    Procedure: COLONOSCOPY;  Surgeon: Vertell Novak., MD;  Location: [facility];  Service: Endoscopy;  Laterality: N/A;  . Small intestine surgery  1992, 1999  . Laparotomy and lysis of adhesions    . Total hip arthroplasty  10/25/2011    Procedure: TOTAL HIP ARTHROPLASTY;  Surgeon: Valeria Batman, MD;  Location: [facility] OR;  Service: Orthopedics;  Laterality: Left;  . Appendectomy  1962  . Abdominal hysterectomy  1988  . Partial colectomy and colostomy  sept 2013    mucous fistula done  . Partial colectomy  05/17/2012    Procedure: PARTIAL COLECTOMY;  Surgeon: Adolph Pollack, MD;  Location: [facility];  Service: General;  Laterality: N/A;  . Colostomy closure  05/17/2012    Procedure: COLOSTOMY CLOSURE;  Surgeon: Adolph Pollack, MD;  Location: [facility];  Service: General;  Laterality: N/A;  . Laparotomy  05/17/2012    Procedure: EXPLORATORY LAPAROTOMY;  Surgeon: Adolph Pollack, MD;  Location: [facility];  Service: General;;  . Lysis of adhesion  05/17/2012    Procedure: LYSIS OF ADHESION;  Surgeon: Adolph Pollack, MD;  Location: [facility];  Service: General;;  . Esophageal biopsy  05/17/2012    Procedure: BIOPSY;  Surgeon: Adolph Pollack, MD;  Location: [facility];  Service: General;;  omental biopsy  . Laparotomy  05/22/2012    Procedure: EXPLORATORY LAPAROTOMY;  Surgeon: Adolph Pollack, MD;  Location: [facility];  Service: General;  Laterality: N/A;  DRAINAGE  INTRA-ABDOMINAL ABSCESS/LYSIS OF ADHESIONSFOR SMALL BOWEL OBSTRUCTION/DIVERTING LOOP ILEOSTOMY   Family History  Problem Relation Age of Onset  . Stroke Father   . Atrial fibrillation Father   .  Hypertension Mother    History  Substance Use Topics  . Smoking status: Current Some Day Smoker -- 0.25 packs/day for 20 years    Types: Cigarettes  . Smokeless tobacco: Never Used  . Alcohol Use: Yes     Comment: 1 - 2 glasses of wine per week   OB History   Grav Para Term Preterm Abortions TAB SAB Ect Mult Living                 Review of Systems  Constitutional: Positive for chills and fatigue.  Respiratory: Positive for shortness of breath.   Cardiovascular: Negative for chest pain, palpitations and leg swelling.  Gastrointestinal: Positive for nausea. Negative for vomiting, abdominal pain, diarrhea and blood in stool.  All other systems reviewed and are negative.    Allergies  Vancomycin; Ativan; Codeine; Tetanus toxoids; Penicillins; and Xarelto  Home Medications   Current Outpatient Rx  Name  Route  Sig  Dispense  Refill  . aspirin 325 MG EC tablet   Oral   Take 325 mg by mouth daily.         . bisoprolol (ZEBETA) 5 MG tablet   Oral   Take 1 tablet (5 mg total) by mouth daily before breakfast.   90 tablet   3   . buPROPion (WELLBUTRIN XL) 300 MG 24 hr tablet   Oral   Take 300 mg by mouth daily before breakfast.          . celecoxib (CELEBREX) 200 MG capsule   Oral   Take 200 mg by mouth every morning.         . diphenoxylate-atropine (LOMOTIL) 2.5-0.025 MG per tablet   Oral   Take 1 tablet by mouth 4 (four) times daily as needed for diarrhea or loose stools.         Marland Kitchen esomeprazole (NEXIUM) 40 MG capsule   Oral   Take 40 mg by mouth every morning.          Marland Kitchen estradiol (ESTRACE) 2 MG tablet   Oral   Take 2 mg by mouth daily before breakfast.          . furosemide (LASIX) 40 MG tablet   Oral   Take 40 mg by mouth daily as needed for fluid.          Marland Kitchen loperamide (IMODIUM) 2 MG capsule   Oral   Take 2 mg by mouth 4 (four) times daily as needed for diarrhea or loose stools.         Marland Kitchen loratadine-pseudoephedrine (CLARITIN-D 12-HOUR)  5-120 MG per tablet   Oral   Take 1 tablet by mouth daily as needed for allergies.          . montelukast (SINGULAIR) 10 MG tablet  Oral   Take 10 mg by mouth daily as needed (for allergy relief).          . ondansetron (ZOFRAN) 4 MG tablet   Oral   Take 4 mg by mouth every 8 (eight) hours as needed for nausea.         . promethazine (PHENERGAN) 25 MG tablet   Oral   Take 25 mg by mouth every 6 (six) hours as needed for nausea.          BP 137/62  Pulse 95  Temp(Src) 98.4 F (36.9 C) (Oral)  Resp 19  SpO2 97% Physical Exam  Nursing note and vitals reviewed. Constitutional: She appears well-developed and well-nourished. No distress.  HENT:  Head: Normocephalic and atraumatic.  Eyes: Conjunctivae are normal. No scleral icterus.  Neck: Normal range of motion.  Cardiovascular: Normal rate, regular rhythm and normal heart sounds.   Pulmonary/Chest: Effort normal and breath sounds normal. No respiratory distress. She has no wheezes. She has no rales. She exhibits no tenderness.  Abdominal: Soft. Bowel sounds are normal. She exhibits no distension and no mass. There is no tenderness. There is no rebound and no guarding.  Ileostomy bag in place. Clean dry bandage in place over open incision.   Musculoskeletal: Normal range of motion.  Neurological: She is alert.  Skin: Skin is warm and dry. She is not diaphoretic.    ED Course  Procedures (including critical care time) Labs Review Labs Reviewed  CBC WITH DIFFERENTIAL - Abnormal; Notable for the following:    RBC 2.16 (*)    Hemoglobin 7.1 (*)    HCT 21.4 (*)    Monocytes Relative 18 (*)    Eosinophils Relative 6 (*)    All other components within normal limits  BASIC METABOLIC PANEL - Abnormal; Notable for the following:    Sodium 127 (*)    Creatinine, Ser 1.11 (*)    Calcium 8.2 (*)    GFR calc non Af Amer 52 (*)    GFR calc Af Amer 60 (*)    All other components within normal limits  PRO B NATRIURETIC  PEPTIDE - Abnormal; Notable for the following:    Pro B Natriuretic peptide (BNP) 2647.0 (*)    All other components within normal limits  TROPONIN I  PREPARE RBC (CROSSMATCH)  TYPE AND SCREEN   Imaging Review Dg Chest Portable 1 View  12/20/2012   *RADIOLOGY REPORT*  Clinical Data: Weakness, shortness of breath  PORTABLE CHEST - 1 VIEW  Comparison: 05/29/2012  Findings: The cardiac shadow is stable.  The previously seen PICC line has been removed in the interval.  The lungs are well-aerated but demonstrate diffuse patchy changes consistent with pulmonary edema.  No focal confluent infiltrate is seen.  IMPRESSION: Increased vasculature with parenchymal changes consistent with pulmonary edema.   Original Report Authenticated By: Alcide Clever, M.D.    Date: 12/20/2012  Rate: 96  Rhythm: normal sinus rhythm and premature ventricular contractions (PVC)  QRS Axis: left borderline  Intervals: normal  ST/T Wave abnormalities: normal  Conduction Disutrbances:none  Narrative Interpretation: sinus rhythm with multiple PVCs   Old EKG Reviewed: changes noted and no signs of atrial fibrillation or flutter    MDM   1. CHF (congestive heart failure)   2. Anemia   3. CMML (chronic myelomonocytic leukemia)   4. SOB (shortness of breath)   5. Fatigue    Pt c/o progressively worsening SOB over past 2 weeks, reports hx of CMML,  followed by the cancer center.  Last transfusion was Feb 2014.    Pt's O2 is 88% on room air, improved to 96% on 2L via [place].    CBC: Hgb-7.1 today (9.5 six months ago), Hct-21.4 today (26.5 six months ago) BMP:  Sodium 127 today down from 135 four months ago, Cr-1.11 today, up from 0.92 four months ago  Discussed pt with Dr. Estell Harpin, will admit for symptomatic annemia.  Transfusion process started in ED for 2 units.  Will consult hospitalist to admit pt.   Dr. Estell Harpin spoke with Dr. Suanne Marker who requested further cardiac workup due to CXR consistent with pulmonary edema. Will get  troponin and BNP, stopping fluids, giving 80mg  lasix.  Pt requesting Dr. Abbey Chatters be informed she is being admitted.   4:33 PM Consulted Dr. Izola Price who agreed to admit pt for CHF and anemia.  2D echo order placed.     Junius Finner, PA-C 12/21/12 1510

## 2012-12-21 DIAGNOSIS — I517 Cardiomegaly: Secondary | ICD-10-CM

## 2012-12-21 DIAGNOSIS — J9600 Acute respiratory failure, unspecified whether with hypoxia or hypercapnia: Secondary | ICD-10-CM

## 2012-12-21 LAB — BASIC METABOLIC PANEL
CO2: 21 mEq/L (ref 19–32)
Calcium: 8.1 mg/dL — ABNORMAL LOW (ref 8.4–10.5)
Creatinine, Ser: 1.28 mg/dL — ABNORMAL HIGH (ref 0.50–1.10)
GFR calc non Af Amer: 44 mL/min — ABNORMAL LOW (ref >90)
Glucose, Bld: 91 mg/dL (ref 70–99)
Sodium: 126 mEq/L — ABNORMAL LOW (ref 135–145)

## 2012-12-21 LAB — IRON AND TIBC
Iron: 91 ug/dL (ref 42–135)
Saturation Ratios: 28 % (ref 20–55)
TIBC: 324 ug/dL (ref 250–470)
UIBC: 233 ug/dL (ref 125–400)

## 2012-12-21 LAB — FOLATE: Folate: 12.5 ng/mL

## 2012-12-21 LAB — TSH: TSH: 2.939 u[IU]/mL (ref 0.350–4.500)

## 2012-12-21 MED ORDER — HYDROCODONE-ACETAMINOPHEN 5-325 MG PO TABS
2.0000 | ORAL_TABLET | ORAL | Status: DC | PRN
  Administered 2012-12-21 – 2012-12-24 (×12): 2 via ORAL
  Filled 2012-12-21 (×13): qty 2

## 2012-12-21 MED ORDER — ENOXAPARIN SODIUM 40 MG/0.4ML ~~LOC~~ SOLN
40.0000 mg | SUBCUTANEOUS | Status: DC
  Administered 2012-12-21 – 2012-12-23 (×3): 40 mg via SUBCUTANEOUS
  Filled 2012-12-21 (×4): qty 0.4

## 2012-12-21 NOTE — Care Management Note (Addendum)
    Page 1 of 1   12/24/2012     1:14:12 PM   CARE MANAGEMENT NOTE 12/24/2012  Patient:  [name], [name]   Account Number:  [id number]  Date Initiated:  12/21/2012  Documentation initiated by:  Lanier Clam  Subjective/Objective Assessment:   62 Y/O F ADMITTED W/SOB.     Action/Plan:   FROM HOME.HAS PCP,PHARMACY.INDEP W/ADL'S   Anticipated DC Date:  12/24/2012   Anticipated DC Plan:  HOME/SELF CARE      DC Planning Services  CM consult      Choice offered to / List presented to:             Status of service:  Completed, signed off Medicare Important Message given?   (If response is "NO", the following Medicare IM given date fields will be blank) Date Medicare IM given:   Date Additional Medicare IM given:    Discharge Disposition:  HOME/SELF CARE  Per UR Regulation:  Reviewed for med. necessity/level of care/duration of stay  If discussed at Long Length of Stay Meetings, dates discussed:    Comments:  12/21/12 Kari Angell RN,BSN NCM [phone] ASSESSED FOR HHC NEEDS.PATIENT INDEP W/ADL'S,FOLLOWSW/PCP.NO ANTICIPATED D/C NEEDS.

## 2012-12-21 NOTE — Progress Notes (Signed)
Echo Lab  2D Echocardiogram completed.  Chabeli Barsamian L Lamin Chandley, RDCS 12/21/2012 1:42 PM

## 2012-12-21 NOTE — Progress Notes (Signed)
Still waiting on transfusion.  Open abdominal wound looks good and dressing was changed.  She is followed by Hematology for her CML and chronic anemia.

## 2012-12-21 NOTE — Consult Note (Addendum)
CARDIOLOGY CONSULT NOTE   Patient ID: [name] MRN: [id number] DOB/AGE: [birth year] 62 y.o.  Admit date: 12/20/2012  Primary Physician   Cassell Clement, MD Primary Cardiologist   TB Reason for Consultation   CHF  [name] is a 62 y.o. female with a history of atrial fibrillation, HTN, chronic anemia, chronic hyponatremia, CMML,  admitted SOB.  and volume overload, preserved EF. She has no documented history of heart failure.  Reports that she always has DOE at her baseline, which she attributes to her chronic anemia.  However, she has noticed worsening DOE and dyspnea at rest over the past couple of days, accompanied with increased fatigue and malaise. She states that she thinks her increased sx are due to her Hb dropping, which normally runs around 8-9, but was 7 upon admission according to her. She denies any increase in LE edema, but does report that her legs normally swell throughout the day and improve in the mornings. She uses 2 pillows chronically for comfort, denies any new orthopnea. No PND. She deneis any chest pain or palpitations. [name] reports that her dry weight fluctuates between 138-148lbs.  She is currently 152lbs. CXR showed pulmonary edema, EKG showed sinus rate 90, normal axis, no ischemic changes. Cr 1.1 BUN 17. TSH 2.93, pro-BNP 2647. Prelim images of echo this morning show normal LV systolic function, no pericardial effusion, normal IVC w/ normal respiratory collapse.  She diuresed over 3 liters since her admission.    Past Medical History  Diagnosis Date  . Hypertension     She has a past hx of essential  . 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 Inflammation of joint of knee     Since we last last saw her she developed problems with an acute which required surgical drainage by her orthopedist Dr. Cleophas Dunker.  Marland Kitchen MRSA (methicillin resistant Staphylococcus aureus)     Knee  surgery drainage was positive for MRSA and she was treated with 3 weeks of doxycycline successfully.  . Diarrhea     Mild  . Exogenous obesity   . GERD (gastroesophageal reflux disease)     2 hosp.- ischemic colitis - residual of Norovirus, 05/2011- sm. bowel obstruction  . Headache(784.0)     migraine headache on occas, less now than when she was younger   . Arthritis     L hip, back, neck   . History of blood transfusion sept 2013    04/2011- /w ischemic colitis , trouble with matching blood  sept 2013  . Anemia     will see hematology consult prior to surgery, recommended by Dr. Patty Sermons  . Anemia 12/15/2010  . Ischemic colitis 01/31/2012  . Atrial flutter     during hospitalization, 04/2011- related to anemia & illness/stress   . Pneumonia     04/2011- not hospitalized , pt. denies SOB, changes in chest, breathing  . 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 Dizziness     occasional     Past Surgical History  Procedure Laterality Date  . Knee surgery      I&D- 2008, post laceration   . Colonoscopy  05/16/2011    Procedure: COLONOSCOPY;  Surgeon: Vertell Novak., MD;  Location: [facility];  Service: Endoscopy;  Laterality: N/A;  . Small intestine surgery  1992, 1999  . Laparotomy and lysis of adhesions    . Total hip  arthroplasty  10/25/2011    Procedure: TOTAL HIP ARTHROPLASTY;  Surgeon: Valeria Batman, MD;  Location: [facility] OR;  Service: Orthopedics;  Laterality: Left;  . Appendectomy  1962  . Abdominal hysterectomy  1988  . Partial colectomy and colostomy  sept 2013    mucous fistula done  . Partial colectomy  05/17/2012    Procedure: PARTIAL COLECTOMY;  Surgeon: Adolph Pollack, MD;  Location: [facility];  Service: General;  Laterality: N/A;  . Colostomy closure  05/17/2012    Procedure: COLOSTOMY CLOSURE;  Surgeon: Adolph Pollack, MD;  Location: [facility];  Service: General;  Laterality: N/A;  . Laparotomy  05/17/2012    Procedure: EXPLORATORY LAPAROTOMY;   Surgeon: Adolph Pollack, MD;  Location: [facility];  Service: General;;  . Lysis of adhesion  05/17/2012    Procedure: LYSIS OF ADHESION;  Surgeon: Adolph Pollack, MD;  Location: [facility];  Service: General;;  . Esophageal biopsy  05/17/2012    Procedure: BIOPSY;  Surgeon: Adolph Pollack, MD;  Location: [facility];  Service: General;;  omental biopsy  . Laparotomy  05/22/2012    Procedure: EXPLORATORY LAPAROTOMY;  Surgeon: Adolph Pollack, MD;  Location: [facility];  Service: General;  Laterality: N/A;  DRAINAGE  INTRA-ABDOMINAL ABSCESS/LYSIS OF ADHESIONSFOR SMALL BOWEL OBSTRUCTION/DIVERTING LOOP ILEOSTOMY    Allergies  Allergen Reactions  . Vancomycin Hives and Rash    ? wheezing  . Ativan [Lorazepam]     confusion  . Codeine Nausea And Vomiting  . Tetanus Toxoids Other (See Comments)    serum  . Penicillins Hives and Rash  . Xarelto [Rivaroxaban] Hives and Rash    ?    I have reviewed the patient's current medications . aspirin  325 mg Oral Daily  . bismuth subsalicylate  30 mL Oral TID AC & HS  . bisoprolol  5 mg Oral QAC breakfast  . buPROPion  300 mg Oral Daily  . celecoxib  200 mg Oral q morning - 10a  . Chlorhexidine Gluconate Cloth  6 each Topical Q0600  . enoxaparin (LOVENOX) injection  30 mg Subcutaneous Q24H  . estradiol  2 mg Oral Daily  . furosemide  20 mg Intravenous Q12H  . loratadine  10 mg Oral Daily  . mupirocin ointment  1 application Nasal BID  . pantoprazole  40 mg Oral Daily  . pseudoephedrine  120 mg Oral BID  . sodium chloride  3 mL Intravenous Q12H     sodium chloride, acetaminophen, diphenoxylate-atropine, HYDROcodone-acetaminophen, loperamide, montelukast, ondansetron (ZOFRAN) IV, promethazine, sodium chloride, vitamin A & D  Prior to Admission medications   Medication Sig Start Date End Date Taking? Authorizing Provider  aspirin 325 MG EC tablet Take 325 mg by mouth daily.   Yes Historical Provider, MD  bisoprolol (ZEBETA) 5 MG tablet Take 1 tablet  (5 mg total) by mouth daily before breakfast. 08/17/12  Yes Cassell Clement, MD  buPROPion (WELLBUTRIN XL) 300 MG 24 hr tablet Take 300 mg by mouth daily before breakfast.    Yes Historical Provider, MD  celecoxib (CELEBREX) 200 MG capsule Take 200 mg by mouth every morning.   Yes Historical Provider, MD  diphenoxylate-atropine (LOMOTIL) 2.5-0.025 MG per tablet Take 1 tablet by mouth 4 (four) times daily as needed for diarrhea or loose stools.   Yes Historical Provider, MD  esomeprazole (NEXIUM) 40 MG capsule Take 40 mg by mouth every morning.    Yes Historical Provider, MD  estradiol (ESTRACE) 2 MG tablet Take  2 mg by mouth daily before breakfast.    Yes Historical Provider, MD  furosemide (LASIX) 40 MG tablet Take 40 mg by mouth daily as needed for fluid.  07/03/12  Yes Historical Provider, MD  loperamide (IMODIUM) 2 MG capsule Take 2 mg by mouth 4 (four) times daily as needed for diarrhea or loose stools.   Yes Historical Provider, MD  loratadine-pseudoephedrine (CLARITIN-D 12-HOUR) 5-120 MG per tablet Take 1 tablet by mouth daily as needed for allergies.    Yes Historical Provider, MD  montelukast (SINGULAIR) 10 MG tablet Take 10 mg by mouth daily as needed (for allergy relief).    Yes Historical Provider, MD  ondansetron (ZOFRAN) 4 MG tablet Take 4 mg by mouth every 8 (eight) hours as needed for nausea.   Yes Historical Provider, MD  promethazine (PHENERGAN) 25 MG tablet Take 25 mg by mouth every 6 (six) hours as needed for nausea.   Yes Historical Provider, MD     History   Social History  . Marital Status: Single    Spouse Name: N/A    Number of Children: 3  . Years of Education: N/A   Occupational History  .      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 Smoking status: Current Some Day Smoker -- 0.25 packs/day for 20 years    Types: Cigarettes  . Smokeless tobacco: Never Used  . Alcohol Use: Yes     Comment: 1 - 2 glasses of wine per week  .  Drug Use: No  . Sexual Activity: Not Currently   Other Topics Concern  . Not on file   Social History Narrative  . No narrative on file    Family Status  Relation Status Death Age  . Father Deceased 34    Diagnosed with Pulmonary hypertension and asbestosis   . Mother Deceased 65  . Sister Alive    Family History  Problem Relation Age of Onset  . Stroke Father   . Atrial fibrillation Father   . Hypertension Mother      Physical Exam: Blood pressure 115/71, pulse 97, temperature 98.1 F (36.7 C), temperature source Oral, resp. rate 16, height 5\' 5"  (1.651 m), weight 152 lb 6.4 oz (69.128 kg), SpO2 99.00%.  General: Well developed, well nourished, WF with no apparent acute distress Head: Eyes PERRLA, No xanthomas.   Normocephalic and atraumatic, oropharynx without edema or exudate. Lungs: Faint crackles anteriorly Heart: HRRR S1 S2, no rub/gallop, No S3 noted.    Neck: No carotid bruits. No lymphadenopathy.  No JVD Abdomen: Bowel sounds present, packed/covered abdominal wound present from surgery.. Extremities: No clubbing or cyanosis.  No pitting edema.  Neuro: Alert and oriented X 3. No focal deficits noted. Psych:  Good affect, responds appropriately Skin: No rashes or lesions noted.  Labs:   Lab Results  Component Value Date   WBC 4.6 12/20/2012   HGB 7.1* 12/20/2012   HCT 21.4* 12/20/2012   MCV 99.1 12/20/2012   PLT 291 12/20/2012   No results found for this basename: INR,  in the last 72 hours  Recent Labs Lab 12/21/12 0447  NA 126*  K 4.1  CL 95*  CO2 21  BUN 21  CREATININE 1.28*  CALCIUM 8.1*  GLUCOSE 91    Recent Labs  12/20/12 1522  TROPONINI <0.30   Pro B Natriuretic peptide (BNP)  Date/Time Value Range Status  12/20/2012  3:22 PM 2647.0* 0 -  125 pg/mL Final  01/04/2012 11:04 AM 2889.0* 0.0 - 100.0 pg/mL Final   TSH  Date/Time Value Range Status  12/20/2012  5:30 PM 2.939  0.350 - 4.500 uIU/mL Final     Performed at [facility]   Echo:    ECG:    12/20/2012: NSR, LAD, multiple PVCs Vent. rate 96 BPM PR interval 200 ms QRS duration 96 ms QT/QTc 372/470 ms P-R-T axes 66 -25 55  Radiology:  Dg Chest Portable 1 View 12/20/2012   *RADIOLOGY REPORT*  Clinical Data: Weakness, shortness of breath  PORTABLE CHEST - 1 VIEW  Comparison: 05/29/2012  Findings: The cardiac shadow is stable.  The previously seen PICC line has been removed in the interval.  The lungs are well-aerated but demonstrate diffuse patchy changes consistent with pulmonary edema.  No focal confluent infiltrate is seen.  IMPRESSION: Increased vasculature with parenchymal changes consistent with pulmonary edema.   Original Report Authenticated By: Alcide Clever, M.D.    ASSESSMENT AND PLAN:   .  1. Clinical congestive heart failure: patient presented w/ signs and symptoms of clinical heart failure, symptoms improved after net diuresis of 3 liters. - follow up official echo report, based on prelim pictures she has normal systolic function and normal RA pressures indicating she is euvolemic currently, the echo was performed after significant diuresis. This is also supported by the bump in her Cr and BUN. - recommend holding diuretics today.  -consider stopping pseudoephedrine and NSAIDs to decrease risk for fluid retention - if SOB after blood transfusion, consider one time dose of lasix today, would not schedule lasix today.   2. Parox afib: sinus rhythm now, denies any palpitations. CHADS2Vasc score of 2, not on anticoagulation previousy due to chronic anemia and CMML. Continue beta blocker and ASA.  3. HTN: continue current meds

## 2012-12-21 NOTE — ED Provider Notes (Signed)
Medical screening examination/treatment/procedure(s) were conducted as a shared visit with non-physician practitioner(s) and myself.  I personally evaluated the patient during the encounter pe lungs clear. Heart rrr  Benny Lennert, MD 12/21/12 1540

## 2012-12-21 NOTE — Progress Notes (Signed)
[facility] PROGRESS NOTE  [name] ZOX:[id number] DOB: [birth year] DOA: 12/20/2012 PCP: Kari Clement, MD HPI: Pt is 62 yo female who presented to [facility] ED with main concern of progressively worsening shortness of breath that initially started 1-2 weeks prior to admission but has gotten worse over the past 2 days. She reports this initially started with exertion and now present at rest, 2-3 pillow orthopnea and worsening LE swelling. Pt is unaware of specific weight gain. She denies chest pain, no fevers, chills, no specific urinary concerns or abdominal concerns except nausea and poor oral intake. Pt denies cough, no palpitations, no similar events in the past.  In the ED, initial oxygen saturation in high 80's and improved to mid 90's with oxygen, CXR consistent with pulmonary vascular congestion and [facility] asked to admit for further evaluation and management of presumptive CHF exacerbation.   Assessment/Plan: 1.   Acute respiratory failure  - possibly secondary to pulmonary vascular congestion based on physical exam findings, symptoms, CXR, BNP  - 2 D ECHO in 2013 with normal systolic function 60 - 65% and with no evidence of diastolic dysfunction  - pt is Lasix at home but takes it as needed only  CARDIOLOGy consulted and recommendations given.  - will monitor daily weights, I's and O's, renal function  - 2 D ECHO ordered  Active Problems:  Open abdominal wound  - appreciate surgery following  - wound care consult ordered  Acute on chronic anemia  - unclear etiology  - will check FOBT and anemia panel  - transfuse 2 U PRBC  - last Hg in 06/2012 was ~9  Acute renal failure  - last Cr in 07/2012 was within normal limits  - will hold off on IVF due to  CHF  - will provide Lasix and will monitor closely  - BMP in AM  - strict I's and O's, daily weights  Chronic hyponatremia  - appears to be stable and at pt's baseline  - monitor closely while pt is on Lasix  Code Status: Full   Family Communication: Pt at bedside  Disposition Plan: Admit to telemetry bed     Consultants:  Cardiology  Surgery.  Procedures:  echo   HPI/Subjective: Comfortable, no sob.  Objective: Filed Vitals:   12/21/12 1700  BP: 123/80  Pulse: 90  Temp: 98.4 F (36.9 C)  Resp: 16    Intake/Output Summary (Last 24 hours) at 12/21/12 1757 Last data filed at 12/21/12 1700  Gross per 24 hour  Intake   1065 ml  Output   3675 ml  Net  -2610 ml   Filed Weights   12/20/12 1726 12/21/12 0504  Weight: 69.7 kg (153 lb 10.6 oz) 69.128 kg (152 lb 6.4 oz)    Exam:   General:  Alert afebrile comfortable  Cardiovascular: s1s2   Respiratory: ctab  Abdomen: soft, mild tenderness.   Musculoskeletal: no pedal edema.   Data Reviewed: Basic Metabolic Panel:  Recent Labs Lab 12/20/12 1400 12/21/12 0447  NA 127* 126*  K 4.8 4.1  CL 97 95*  CO2 19 21  GLUCOSE 84 91  BUN 17 21  CREATININE 1.11* 1.28*  CALCIUM 8.2* 8.1*   Liver Function Tests: No results found for this basename: AST, ALT, ALKPHOS, BILITOT, PROT, ALBUMIN,  in the last 168 hours No results found for this basename: LIPASE, AMYLASE,  in the last 168 hours No results found for this basename: AMMONIA,  in the last 168 hours CBC:  Recent Labs  Lab 12/20/12 1400  WBC 4.6  NEUTROABS 2.6  HGB 7.1*  HCT 21.4*  MCV 99.1  PLT 291   Cardiac Enzymes:  Recent Labs Lab 12/20/12 1522  TROPONINI <0.30   BNP (last 3 results)  Recent Labs  01/04/12 1104 12/20/12 1522  PROBNP 2889.0* 2647.0*   CBG: No results found for this basename: GLUCAP,  in the last 168 hours  Recent Results (from the past 240 hour(s))  MRSA PCR SCREENING     Status: Abnormal   Collection Time    12/20/12  7:23 PM      Result Value Range Status   MRSA by PCR POSITIVE (*) NEGATIVE Final   Comment:            The GeneXpert MRSA Assay (FDA     approved for NASAL specimens     only), is one component of a     comprehensive  MRSA colonization     surveillance program. It is not     intended to diagnose MRSA     infection nor to guide or     monitor treatment for     MRSA infections.     RESULT CALLED TO, READ BACK BY AND VERIFIED WITH:     [name],S/4E @2217  ON 12/20/12 BY [name],S.     Studies: Dg Chest Portable 1 View  12/20/2012   *RADIOLOGY REPORT*  Clinical Data: Weakness, shortness of breath  PORTABLE CHEST - 1 VIEW  Comparison: 05/29/2012  Findings: The cardiac shadow is stable.  The previously seen PICC line has been removed in the interval.  The lungs are well-aerated but demonstrate diffuse patchy changes consistent with pulmonary edema.  No focal confluent infiltrate is seen.  IMPRESSION: Increased vasculature with parenchymal changes consistent with pulmonary edema.   Original Report Authenticated By: [name], M.D.    Scheduled Meds: . aspirin  325 mg Oral Daily  . bismuth subsalicylate  30 mL Oral TID AC & HS  . bisoprolol  5 mg Oral QAC breakfast  . buPROPion  300 mg Oral Daily  . celecoxib  200 mg Oral q morning - 10a  . Chlorhexidine Gluconate Cloth  6 each Topical Q0600  . enoxaparin (LOVENOX) injection  40 mg Subcutaneous Q24H  . estradiol  2 mg Oral Daily  . loratadine  10 mg Oral Daily  . mupirocin ointment  1 application Nasal BID  . pantoprazole  40 mg Oral Daily  . pseudoephedrine  120 mg Oral BID  . sodium chloride  3 mL Intravenous Q12H   Continuous Infusions:   Principal Problem:   Acute respiratory failure Active Problems:   Anemia   Chronic hyponatremia   Acute renal failure    Time spent: 25 min    [name]  [facility] Pager [phone] If 7PM-7AM, please contact night-coverage at [email or web address], password [facility] 12/21/2012, 5:57 PM  LOS: 1 day

## 2012-12-21 NOTE — Progress Notes (Signed)
Nutrition Brief Note  Patient identified on the Malnutrition Screening Tool (MST) Report  Wt Readings from Last 15 Encounters:  12/21/12 152 lb 6.4 oz (69.128 kg)  12/13/12 150 lb 12.8 oz (68.402 kg)  11/29/12 147 lb 6.4 oz (66.86 kg)  11/21/12 148 lb (67.132 kg)  11/19/12 149 lb 12.8 oz (67.949 kg)  09/19/12 148 lb 9.6 oz (67.405 kg)  08/30/12 146 lb (66.225 kg)  08/17/12 151 lb 12.8 oz (68.856 kg)  08/14/12 152 lb 9.6 oz (69.219 kg)  07/30/12 151 lb 12.8 oz (68.856 kg)  07/20/12 147 lb (66.679 kg)  07/17/12 147 lb 9.6 oz (66.951 kg)  06/27/12 151 lb 6.4 oz (68.675 kg)  06/20/12 149 lb (67.586 kg)  06/15/12 151 lb (68.493 kg)    Body mass index is 25.36 kg/(m^2). Patient meets criteria for Overweight based on current BMI.   Current diet order is Regular, patient is consuming approximately 25% of meals at this time. Pt states that she has been eating poorly for the past 2 days due to pain but, reports she was eating very well before then with small frequent meals. Pt states she will eat better once her pain improves and she refuses all supplements at this time.  Per chart pt has periods of high ileostomy output. Reviewed diet tips to help decrease output and form stools- pt states she already knows all this. Per weight history above pt has been maintaining her weight for the past several months. Labs and medications reviewed.   No nutrition interventions warranted at this time. If nutrition issues arise, please consult RD.   Ian Malkin RD, LDN Inpatient Clinical Dietitian Pager: [phone] After Hours Pager: [phone]

## 2012-12-22 ENCOUNTER — Inpatient Hospital Stay (HOSPITAL_COMMUNITY): Payer: BC Managed Care – PPO

## 2012-12-22 DIAGNOSIS — N179 Acute kidney failure, unspecified: Secondary | ICD-10-CM

## 2012-12-22 LAB — BASIC METABOLIC PANEL
BUN: 21 mg/dL (ref 6–23)
CO2: 20 mEq/L (ref 19–32)
Calcium: 7.6 mg/dL — ABNORMAL LOW (ref 8.4–10.5)
Chloride: 94 mEq/L — ABNORMAL LOW (ref 96–112)
Creatinine, Ser: 1.19 mg/dL — ABNORMAL HIGH (ref 0.50–1.10)

## 2012-12-22 MED ORDER — OXYCODONE HCL 5 MG PO TABS
10.0000 mg | ORAL_TABLET | ORAL | Status: DC | PRN
  Administered 2012-12-22 – 2012-12-24 (×7): 10 mg via ORAL
  Filled 2012-12-22 (×7): qty 2

## 2012-12-22 MED ORDER — FUROSEMIDE 10 MG/ML IJ SOLN
20.0000 mg | Freq: Once | INTRAMUSCULAR | Status: AC
  Administered 2012-12-22: 21:00:00 20 mg via INTRAVENOUS
  Filled 2012-12-22: qty 2

## 2012-12-22 NOTE — Progress Notes (Signed)
Got one unit of blood.  Waiting on the other .  Open abdominal wound is clean. Dressing changed.

## 2012-12-22 NOTE — Progress Notes (Signed)
[facility] PROGRESS NOTE  Cheyenne Binion AVW:[id number] DOB: April 22, 1950 DOA: 12/20/2012 PCP: Cassell Clement, MD HPI: Pt is 62 yo female who presented to [facility] ED with main concern of progressively worsening shortness of breath that initially started 1-2 weeks prior to admission but has gotten worse over the past 2 days. She reports this initially started with exertion and now present at rest, 2-3 pillow orthopnea and worsening LE swelling. Pt is unaware of specific weight gain. She denies chest pain, no fevers, chills, no specific urinary concerns or abdominal concerns except nausea and poor oral intake. Pt denies cough, no palpitations, no similar events in the past.  In the ED, initial oxygen saturation in high 80's and improved to mid 90's with oxygen, CXR consistent with pulmonary vascular congestion and [facility] asked to admit for further evaluation and management of presumptive CHF exacerbation.   Assessment/Plan: 1.   Acute respiratory failure  - possibly secondary to pulmonary vascular congestion based on physical exam findings, symptoms, CXR, BNP  - 2 D ECHO in 2013 with normal systolic function 60 - 65% and with no evidence of diastolic dysfunction  - pt is Lasix at home but takes it as needed only  CARDIOLOGy consulted and recommendations given.  - will monitor daily weights, I's and O's, renal function  - 2 D ECHO ordered showed good systolic function and mild diastolic dysfunction.  Active Problems:  Open abdominal wound  - appreciate surgery following  - wound care consult ordered  Acute on chronic anemia  - unclear etiology  - will check FOBT and anemia panel  - transfuse 2 U PRBC  - last Hg in 06/2012 was ~9 . She is getting the 2 nd unit of prbc.  Acute renal failure  - last Cr in 07/2012 was within normal limits  - will hold off on IVF due to  CHF  - BMP in AM showed improvement in creatinine.  - strict I's and O's, daily weights  Chronic hyponatremia  - appears to be  getting worse. Possibly SIADH.  - will order one dose of lasix tonight.  - will get TSH, urine osmo, serum osmo, am cortisol and urine sodium levels.   Code Status: Full  Family Communication: none atbedside Disposition Plan: possible d/c in am.      Consultants:  Cardiology  Surgery.  Procedures:  echo   HPI/Subjective: Comfortable, no sob.  Objective: Filed Vitals:   12/22/12 1740  BP: 145/85  Pulse: 89  Temp: 98.5 F (36.9 C)  Resp: 18    Intake/Output Summary (Last 24 hours) at 12/22/12 1908 Last data filed at 12/22/12 1715  Gross per 24 hour  Intake 1436.67 ml  Output    850 ml  Net 586.67 ml   Filed Weights   12/20/12 1726 12/21/12 0504 12/22/12 0622  Weight: 69.7 kg (153 lb 10.6 oz) 69.128 kg (152 lb 6.4 oz) 67.405 kg (148 lb 9.6 oz)    Exam:   General:  Alert afebrile comfortable  Cardiovascular: s1s2   Respiratory: ctab  Abdomen: soft, mild tenderness.   Musculoskeletal: no pedal edema.   Data Reviewed: Basic Metabolic Panel:  Recent Labs Lab 12/20/12 1400 12/21/12 0447 12/22/12 0505  NA 127* 126* 124*  K 4.8 4.1 4.2  CL 97 95* 94*  CO2 19 21 20   GLUCOSE 84 91 76  BUN 17 21 21   CREATININE 1.11* 1.28* 1.19*  CALCIUM 8.2* 8.1* 7.6*   Liver Function Tests: No results found for this basename:  AST, ALT, ALKPHOS, BILITOT, PROT, ALBUMIN,  in the last 168 hours No results found for this basename: LIPASE, AMYLASE,  in the last 168 hours No results found for this basename: AMMONIA,  in the last 168 hours CBC:  Recent Labs Lab 12/20/12 1400  WBC 4.6  NEUTROABS 2.6  HGB 7.1*  HCT 21.4*  MCV 99.1  PLT 291   Cardiac Enzymes:  Recent Labs Lab 12/20/12 1522  TROPONINI <0.30   BNP (last 3 results)  Recent Labs  01/04/12 1104 12/20/12 1522  PROBNP 2889.0* 2647.0*   CBG: No results found for this basename: GLUCAP,  in the last 168 hours  Recent Results (from the past 240 hour(s))  MRSA PCR SCREENING     Status:  Abnormal   Collection Time    12/20/12  7:23 PM      Result Value Range Status   MRSA by PCR POSITIVE (*) NEGATIVE Final   Comment:            The GeneXpert MRSA Assay (FDA     approved for NASAL specimens     only), is one component of a     comprehensive MRSA colonization     surveillance program. It is not     intended to diagnose MRSA     infection nor to guide or     monitor treatment for     MRSA infections.     RESULT CALLED TO, READ BACK BY AND VERIFIED WITH:     LEWIS,S/4E @2217  ON 12/20/12 BY KARCZEWSKI,S.     Studies: Dg Chest 2 View  12/22/2012   *RADIOLOGY REPORT*  Clinical Data: Evaluate pulmonary edema.  CHEST - 2 VIEW  Comparison: 12/20/2012  Findings: Normal cardiac silhouette.  There is bilateral fine air space disease which is improved compared to prior.  Small effusions are present.  No pneumothorax.  IMPRESSION: Improvement in bilateral air space disease.   Original Report Authenticated By: Genevive Bi, M.D.    Scheduled Meds: . aspirin  325 mg Oral Daily  . bismuth subsalicylate  30 mL Oral TID AC & HS  . bisoprolol  5 mg Oral QAC breakfast  . buPROPion  300 mg Oral Daily  . celecoxib  200 mg Oral q morning - 10a  . Chlorhexidine Gluconate Cloth  6 each Topical Q0600  . enoxaparin (LOVENOX) injection  40 mg Subcutaneous Q24H  . estradiol  2 mg Oral Daily  . loratadine  10 mg Oral Daily  . mupirocin ointment  1 application Nasal BID  . pantoprazole  40 mg Oral Daily  . pseudoephedrine  120 mg Oral BID  . sodium chloride  3 mL Intravenous Q12H   Continuous Infusions:   Principal Problem:   Acute respiratory failure Active Problems:   Anemia   Chronic hyponatremia   Acute renal failure    Time spent: 25 min    Sophonie Goforth  [facility] Pager [phone] If 7PM-7AM, please contact night-coverage at [email or web address], password [facility] 12/22/2012, 7:08 PM  LOS: 2 days

## 2012-12-22 NOTE — Progress Notes (Signed)
Pt reports severe pain with no relief from vicodin. MD made aware. New order given for oxy ir. Vwilliams,rn.

## 2012-12-22 NOTE — Progress Notes (Addendum)
Subjective:  Laying flat, mild DOE. No CP. Had bronchitis one month ago when seeing Dr. Patty Sermons.   Objective:  Vital Signs in the last 24 hours: Temp:  [97.2 F (36.2 C)-98.4 F (36.9 C)] 98.3 F (36.8 C) (09/06 0453) Pulse Rate:  [81-93] 81 (09/06 0818) Resp:  [16-18] 18 (09/06 0453) BP: (107-136)/(60-80) 123/69 mmHg (09/06 0818) SpO2:  [97 %-100 %] 97 % (09/06 0626) Weight:  [67.405 kg (148 lb 9.6 oz)] 67.405 kg (148 lb 9.6 oz) (09/06 0622)  Intake/Output from previous day: 09/05 0701 - 09/06 0700 In: 1765 [P.O.:1440; Blood:325] Out: 1625 [Urine:1050; Stool:575]   Physical Exam: General: Well developed, well nourished, in no acute distress. Pale Head:  Normocephalic and atraumatic. Lungs: Mild wheeze R anterior Heart: Normal S1 and S2.  No murmur, rubs or gallops.  Abdomen: soft, non-tender, positive bowel sounds. Extremities: No clubbing or cyanosis. No edema. Neurologic: Alert and oriented x 3.    Lab Results:  Recent Labs  12/20/12 1400  WBC 4.6  HGB 7.1*  PLT 291    Recent Labs  12/21/12 0447 12/22/12 0505  NA 126* 124*  K 4.1 4.2  CL 95* 94*  CO2 21 20  GLUCOSE 91 76  BUN 21 21  CREATININE 1.28* 1.19*    Recent Labs  12/20/12 1522  TROPONINI <0.30   Hepatic Function Panel No results found for this basename: PROT, ALBUMIN, AST, ALT, ALKPHOS, BILITOT, BILIDIR, IBILI,  in the last 72 hours No results found for this basename: CHOL,  in the last 72 hours No results found for this basename: PROTIME,  in the last 72 hours  Imaging: Dg Chest Portable 1 View  12/20/2012   *RADIOLOGY REPORT*  Clinical Data: Weakness, shortness of breath  PORTABLE CHEST - 1 VIEW  Comparison: 05/29/2012  Findings: The cardiac shadow is stable.  The previously seen PICC line has been removed in the interval.  The lungs are well-aerated but demonstrate diffuse patchy changes consistent with pulmonary edema.  No focal confluent infiltrate is seen.  IMPRESSION: Increased  vasculature with parenchymal changes consistent with pulmonary edema.   Original Report Authenticated By: Alcide Clever, M.D.   Personally viewed.   Telemetry: NSR, S tachy Personally viewed.   Cardiac Studies:  Normal EF, grade 1 DD  Assessment/Plan:  Principal Problem:   Acute respiratory failure Active Problems:   Anemia   Chronic hyponatremia   Acute renal failure   - Normal EF, grade one diastolic dysfunction. Likely playing some role in dyspnea. Agreed with plan of prior lasix. Does not appear to need any currently after PRBC. Monitor. If SOB, OK to give.  - BNP was elevated  - Anemia playing a role in dyspnea as well  - ? Some degree of reactive airway.   - Hyponatremia worse - fluid restrict. Per primary team.   - No afib, parox. 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 Bb ASA  [name] 12/22/2012, 9:45 AM

## 2012-12-23 DIAGNOSIS — M549 Dorsalgia, unspecified: Secondary | ICD-10-CM

## 2012-12-23 DIAGNOSIS — G8929 Other chronic pain: Secondary | ICD-10-CM

## 2012-12-23 LAB — BASIC METABOLIC PANEL
BUN: 19 mg/dL (ref 6–23)
CO2: 21 mEq/L (ref 19–32)
Chloride: 95 mEq/L — ABNORMAL LOW (ref 96–112)
Creatinine, Ser: 1.12 mg/dL — ABNORMAL HIGH (ref 0.50–1.10)

## 2012-12-23 LAB — CBC
HCT: 25.3 % — ABNORMAL LOW (ref 36.0–46.0)
Hemoglobin: 9 g/dL — ABNORMAL LOW (ref 12.0–15.0)
MCH: 34.1 pg — ABNORMAL HIGH (ref 26.0–34.0)
MCHC: 35.6 g/dL (ref 30.0–36.0)
MCV: 95.8 fL (ref 78.0–100.0)

## 2012-12-23 MED ORDER — HYDROMORPHONE HCL PF 1 MG/ML IJ SOLN
1.0000 mg | INTRAMUSCULAR | Status: DC | PRN
  Administered 2012-12-23 – 2012-12-24 (×5): 1 mg via INTRAVENOUS
  Filled 2012-12-23 (×5): qty 1

## 2012-12-23 MED ORDER — FUROSEMIDE 40 MG PO TABS
40.0000 mg | ORAL_TABLET | Freq: Two times a day (BID) | ORAL | Status: DC
  Administered 2012-12-23 – 2012-12-24 (×2): 40 mg via ORAL
  Filled 2012-12-23 (×4): qty 1

## 2012-12-23 MED ORDER — SILVER NITRATE-POT NITRATE 75-25 % EX MISC
1.0000 | CUTANEOUS | Status: DC | PRN
  Administered 2012-12-23 – 2012-12-24 (×3): 1 via TOPICAL
  Filled 2012-12-23 (×2): qty 1

## 2012-12-23 MED ORDER — FUROSEMIDE 10 MG/ML IJ SOLN
40.0000 mg | Freq: Once | INTRAMUSCULAR | Status: AC
  Administered 2012-12-23: 11:00:00 40 mg via INTRAVENOUS
  Filled 2012-12-23: qty 4

## 2012-12-23 NOTE — Progress Notes (Signed)
[facility] PROGRESS NOTE  [name] ZOX:[id number] DOB: [birth year] DOA: 12/20/2012 PCP: Cassell Clement, MD HPI: Pt is 62 yo female who presented to [facility] ED with main concern of progressively worsening shortness of breath that initially started 1-2 weeks prior to admission but has gotten worse over the past 2 days. She reports this initially started with exertion and now present at rest, 2-3 pillow orthopnea and worsening LE swelling. Pt is unaware of specific weight gain. She denies chest pain, no fevers, chills, no specific urinary concerns or abdominal concerns except nausea and poor oral intake. Pt denies cough, no palpitations, no similar events in the past.  In the ED, initial oxygen saturation in high 80's and improved to mid 90's with oxygen, CXR consistent with pulmonary vascular congestion and [facility] asked to admit for further evaluation and management of presumptive CHF exacerbation.   Assessment/Plan: 1.   Acute respiratory failure  - possibly secondary to pulmonary vascular congestion based on physical exam findings, symptoms, CXR, BNP  - 2 D ECHO in 2013 with normal systolic function 60 - 65% and with no evidence of diastolic dysfunction  - pt is Lasix at home but takes it as needed only  CARDIOLOGy consulted and recommendations given.  - will monitor daily weights, I's and O's, renal function  - 2 D ECHO ordered showed good systolic function and mild diastolic dysfunction.  -BID lasix ordered for today and reevaluate in am.  Active Problems:  Open abdominal wound  - appreciate surgery following  - wound care consult ordered  Acute on chronic anemia  - unclear etiology  - will check FOBT and anemia panel  - transfuse 2 U PRBC  - last Hg in 06/2012 was ~9 . She is getting the 2 nd unit of prbc.  Acute renal failure  - last Cr in 07/2012 was within normal limits  - will hold off on IVF due to  CHF  - BMP in AM showed improvement in creatinine.  - strict I's and O's,  daily weights  Chronic hyponatremia  - appears to be getting worse. Possibly SIADH.  - will order one dose of lasix tonight.  - will get TSH, urine osmo, serum osmo, am cortisol and urine sodium levels.   Code Status: Full  Family Communication: none atbedside Disposition Plan: possible d/c in am.      Consultants:  Cardiology  Surgery.  Procedures:  echo   HPI/Subjective: Comfortable, no sob.  Objective: Filed Vitals:   12/23/12 1417  BP: 117/72  Pulse: 76  Temp: 98.7 F (37.1 C)  Resp: 18    Intake/Output Summary (Last 24 hours) at 12/23/12 1436 Last data filed at 12/23/12 1300  Gross per 24 hour  Intake 987.17 ml  Output    450 ml  Net 537.17 ml   Filed Weights   12/21/12 0504 12/22/12 0622 12/23/12 0630  Weight: 69.128 kg (152 lb 6.4 oz) 67.405 kg (148 lb 9.6 oz) 67.2 kg (148 lb 2.4 oz)    Exam:   General:  Alert afebrile comfortable  Cardiovascular: s1s2   Respiratory: ctab  Abdomen: soft, mild tenderness.   Musculoskeletal: no pedal edema.   Data Reviewed: Basic Metabolic Panel:  Recent Labs Lab 12/20/12 1400 12/21/12 0447 12/22/12 0505 12/23/12 0643  NA 127* 126* 124* 126*  K 4.8 4.1 4.2 4.2  CL 97 95* 94* 95*  CO2 19 21 20 21   GLUCOSE 84 91 76 94  BUN 17 21 21 19   CREATININE 1.11*  1.28* 1.19* 1.12*  CALCIUM 8.2* 8.1* 7.6* 7.7*   Liver Function Tests: No results found for this basename: AST, ALT, ALKPHOS, BILITOT, PROT, ALBUMIN,  in the last 168 hours No results found for this basename: LIPASE, AMYLASE,  in the last 168 hours No results found for this basename: AMMONIA,  in the last 168 hours CBC:  Recent Labs Lab 12/20/12 1400 12/23/12 0643  WBC 4.6 4.8  NEUTROABS 2.6  --   HGB 7.1* 9.0*  HCT 21.4* 25.3*  MCV 99.1 95.8  PLT 291 220   Cardiac Enzymes:  Recent Labs Lab 12/20/12 1522  TROPONINI <0.30   BNP (last 3 results)  Recent Labs  01/04/12 1104 12/20/12 1522  PROBNP 2889.0* 2647.0*   CBG: No  results found for this basename: GLUCAP,  in the last 168 hours  Recent Results (from the past 240 hour(s))  MRSA PCR SCREENING     Status: Abnormal   Collection Time    12/20/12  7:23 PM      Result Value Range Status   MRSA by PCR POSITIVE (*) NEGATIVE Final   Comment:            The GeneXpert MRSA Assay (FDA     approved for NASAL specimens     only), is one component of a     comprehensive MRSA colonization     surveillance program. It is not     intended to diagnose MRSA     infection nor to guide or     monitor treatment for     MRSA infections.     RESULT CALLED TO, READ BACK BY AND VERIFIED WITH:     LEWIS,S/4E @2217  ON 12/20/12 BY KARCZEWSKI,S.     Studies: Dg Chest 2 View  12/22/2012   *RADIOLOGY REPORT*  Clinical Data: Evaluate pulmonary edema.  CHEST - 2 VIEW  Comparison: 12/20/2012  Findings: Normal cardiac silhouette.  There is bilateral fine air space disease which is improved compared to prior.  Small effusions are present.  No pneumothorax.  IMPRESSION: Improvement in bilateral air space disease.   Original Report Authenticated By: Genevive Bi, M.D.    Scheduled Meds: . aspirin  325 mg Oral Daily  . bismuth subsalicylate  30 mL Oral TID AC & HS  . bisoprolol  5 mg Oral QAC breakfast  . buPROPion  300 mg Oral Daily  . Chlorhexidine Gluconate Cloth  6 each Topical Q0600  . enoxaparin (LOVENOX) injection  40 mg Subcutaneous Q24H  . estradiol  2 mg Oral Daily  . furosemide  40 mg Oral BID  . loratadine  10 mg Oral Daily  . mupirocin ointment  1 application Nasal BID  . pantoprazole  40 mg Oral Daily  . pseudoephedrine  120 mg Oral BID  . sodium chloride  3 mL Intravenous Q12H   Continuous Infusions:   Principal Problem:   Acute respiratory failure Active Problems:   Anemia   Chronic hyponatremia   Acute renal failure    Time spent: 25 min    Diaz Crago  [facility] Pager [phone] If 7PM-7AM, please contact night-coverage at  [email or web address], password [facility] 12/23/2012, 2:36 PM  LOS: 3 days

## 2012-12-23 NOTE — Progress Notes (Signed)
Had some bleeding from granulation tissue in wound last night controlled with silver nitrate application.  This has happened before.  The wound is clean with no bleeding this morning.  Continue current wound care.

## 2012-12-23 NOTE — Progress Notes (Addendum)
Subjective:  Mild SOB and wheezing yesterday. PRBC. Not great urine output.   Objective:  Vital Signs in the last 24 hours: Temp:  [97.8 F (36.6 C)-98.5 F (36.9 C)] 98 F (36.7 C) (09/07 0630) Pulse Rate:  [79-92] 92 (09/07 0630) Resp:  [16-20] 20 (09/07 0630) BP: (120-150)/(60-85) 132/84 mmHg (09/07 0630) SpO2:  [93 %-94 %] 93 % (09/07 0630) Weight:  [67.2 kg (148 lb 2.4 oz)] 67.2 kg (148 lb 2.4 oz) (09/07 0630)  Intake/Output from previous day: 09/06 0701 - 09/07 0700 In: 1609.7 [P.O.:960; I.V.:303; Blood:346.7] Out: -    Physical Exam: General: Well developed, well nourished, in no acute distress. Head:  Normocephalic and atraumatic. Lungs: Clear to auscultation and percussion. No wheeze Heart: Normal S1 and S2.  No murmur, rubs or gallops.  Abdomen: soft, non-tender, positive bowel sounds. Extremities: No clubbing or cyanosis. No edema. Neurologic: Alert and oriented x 3.    Lab Results:  Recent Labs  12/20/12 1400 12/23/12 0643  WBC 4.6 4.8  HGB 7.1* 9.0*  PLT 291 220    Recent Labs  12/22/12 0505 12/23/12 0643  NA 124* 126*  K 4.2 4.2  CL 94* 95*  CO2 20 21  GLUCOSE 76 94  BUN 21 19  CREATININE 1.19* 1.12*    Recent Labs  12/20/12 1522  TROPONINI <0.30  Dg Chest 2 View  12/22/2012   *RADIOLOGY REPORT*  Clinical Data: Evaluate pulmonary edema.  CHEST - 2 VIEW  Comparison: 12/20/2012  Findings: Normal cardiac silhouette.  There is bilateral fine air space disease which is improved compared to prior.  Small effusions are present.  No pneumothorax.  IMPRESSION: Improvement in bilateral air space disease.   Original Report Authenticated By: Genevive Bi, M.D.     Telemetry: NSR Personally viewed.    Cardiac Studies:  Normal ef. DD  Assessment/Plan:   62 year old with diastolic heart failure, acute on chronic, chronic hyponatremia, anemia, elevated BNP, abdominal wound, history of PAF - now NSR.   - Net + balance yesterday  - IV lasix 40mg   x 1  - PO lasix 40mg  BID  - PRBC yesterday   - Creat 1.12  - Na 126 improved. Fluid restrict.   Continue Zebeta, lasix  I would consider stopping Celebrex if possible due to fluid retention.   Possible ready for DC tomorrow from CV standpoint.     [name] 12/23/2012, 9:12 AM

## 2012-12-24 ENCOUNTER — Other Ambulatory Visit (INDEPENDENT_AMBULATORY_CARE_PROVIDER_SITE_OTHER): Payer: Self-pay

## 2012-12-24 DIAGNOSIS — S31109D Unspecified open wound of abdominal wall, unspecified quadrant without penetration into peritoneal cavity, subsequent encounter: Secondary | ICD-10-CM

## 2012-12-24 DIAGNOSIS — I5033 Acute on chronic diastolic (congestive) heart failure: Principal | ICD-10-CM

## 2012-12-24 DIAGNOSIS — I509 Heart failure, unspecified: Secondary | ICD-10-CM

## 2012-12-24 LAB — TYPE AND SCREEN
ABO/RH(D): A POS
Antibody Screen: POSITIVE
DAT, IgG: NEGATIVE
Donor AG Type: NEGATIVE
Donor AG Type: NEGATIVE
Unit division: 0

## 2012-12-24 LAB — BASIC METABOLIC PANEL
CO2: 22 mEq/L (ref 19–32)
Calcium: 7.9 mg/dL — ABNORMAL LOW (ref 8.4–10.5)
Creatinine, Ser: 1.18 mg/dL — ABNORMAL HIGH (ref 0.50–1.10)
GFR calc Af Amer: 56 mL/min — ABNORMAL LOW (ref >90)

## 2012-12-24 LAB — CBC
MCH: 33.1 pg (ref 26.0–34.0)
MCV: 96.1 fL (ref 78.0–100.0)
Platelets: 237 10*3/uL (ref 150–400)
RDW: 17.7 % — ABNORMAL HIGH (ref 11.5–15.5)

## 2012-12-24 MED ORDER — FUROSEMIDE 40 MG PO TABS: 40.0000 mg | ORAL_TABLET | Freq: Every day | ORAL | Status: DC

## 2012-12-24 MED ORDER — HYDROCODONE-ACETAMINOPHEN 5-325 MG PO TABS: 2.0000 | ORAL_TABLET | ORAL | Status: DC | PRN

## 2012-12-24 MED ORDER — SILVER NITRATE-POT NITRATE 75-25 % EX MISC: 1.0000 | CUTANEOUS | Status: DC | PRN

## 2012-12-24 NOTE — Discharge Summary (Signed)
Physician Discharge Summary  Bovill Semel GNF:[id number] DOB: 1951/02/06 DOA: 12/20/2012  PCP: Cassell Clement, MD  Admit date: 12/20/2012 Discharge date: 12/24/2012  Time spent: 35 minutes  Recommendations for Outpatient Follow-up:  1. Follow up with PCP in one week 2. Please check BMP , cbc in less than a week to check sodium and renal parameter and hemoglobin.   Discharge Diagnoses:  Principal Problem:   Acute respiratory failure Active Problems:   Anemia   Chronic hyponatremia   Acute renal failure   Discharge Condition: improved    Filed Weights   12/22/12 0622 12/23/12 0630 12/24/12 8469  Weight: 67.405 kg (148 lb 9.6 oz) 67.2 kg (148 lb 2.4 oz) 65.681 kg (144 lb 12.8 oz)    History of present illness:    Pt is 62 yo female who presented to [facility] ED with main concern of progressively worsening shortness of breath that initially started 1-2 weeks prior to admission but has gotten worse over the past 2 days. She reports this initially started with exertion and now present at rest, 2-3 pillow orthopnea and worsening LE swelling. Pt is unaware of specific weight gain. She denies chest pain, no fevers, chills, no specific urinary concerns or abdominal concerns except nausea and poor oral intake. Pt denies cough, no palpitations, no similar events in the past.  In the ED, initial oxygen saturation in high 80's and improved to mid 90's with oxygen, CXR consistent with pulmonary vascular congestion and TRH asked to admit for further evaluation and management of presumptive CHF exacerbation. She waas started on IV lasix and transitioned to oral lasix. She is discharged on oral lasix daily and recommended to check daily weights. She was also found to have low sodium. 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 She is recommended to continue with fluid restriction and checking sodium in 2 to 3 days at Dr  Patty Sermons office. Patient currently refusing home health PT and RN services.    Hospital Course:  Acute respiratory failure  - possibly secondary to pulmonary vascular congestion based on physical exam findings, symptoms, CXR, BNP. 2 D ECHO in 2013 with normal systolic function 60 - 65% and with no evidence of diastolic dysfunction. pt is Lasix at home but takes it as needed only. CARDIOLOGy consulted and recommendations given.  repeat  2 D ECHO ordered showed good systolic function and mild diastolic dysfunction.  she was treated for  acute diastolic heart failure with IV lasix and is being discharged on po lasix 40 mg daily. Her repeat CXR shows an improvement of pulmonary edema. She is asymptomatic and is not requiring oxygen. She also refused home health PT or RN.  She was recommended to check her labs including bmp and cbc in less than a week to check her sodium level, her renal parameters and cbc to check hemoglobin.  Active Problems:  Open abdominal wound  - appreciate surgery following  - outpatient follow up for skin graft.  Acute on chronic anemia  - unclear etiology, possibly microscopic bleeding from the abdominal bleeding.   - ferritin is 109, and her b12 and folate are within normal limits.  - transfused 2 U PRBC  - last Hg in 06/2012 was ~9 . She received 2 units of prbc transfusion.  Acute renal failure  - last Cr in 07/2012 was within normal limits. - BMP  showed improvement in creatinine.  - strict I's  and O's, daily weights  Chronic hyponatremia  -  Possibly SIADH , she is currently asymptomatic, denies any headache , nausea or vomiting or dizziness.  refusing to stay in the hospital to work to improve the sodium level, she reports she had low sodium levels in the past and knows to improve the sodium by fluid restriction. Recommended fluid restriction and checking sodium level in 2 days at PCP office. Also continue with lasix 40 mg daily and daily weights and use extra lasix as  needed. Her tsh and am cortisol level normal and serum osmo is 268.    Procedures:  Echocardiogram.   Consultations:  Cardiology  surgery  Discharge Exam: Filed Vitals:   12/24/12 0605  BP: 124/79  Pulse: 110  Temp: 98 F (36.7 C)  Resp: 18    General: alert afebrile comfortable Cardiovascular: s1s2 RRR Respiratory: ctab, no wheezing or rhonchi.   Discharge Instructions  Discharge Orders   Future Appointments Provider Department Dept Phone   01/04/2013 3:30 PM Adolph Pollack, MD [facility], [place] [phone]   01/08/2013 1:00 PM Mauri [place] [facility] MEDICAL ONCOLOGY [phone]   01/08/2013 1:30 PM [facility] Covering Provider 2 [facility] MEDICAL ONCOLOGY [phone]   02/19/2013 11:30 AM Cassell Clement, MD [facility] Main Office [place]) [phone]   Future Orders Complete By Expires   Discharge instructions  As directed    Comments:     Follow up with Dr Patty Sermons in one week.  Continue with fluid restriction.       Medication List    STOP taking these medications       celecoxib 200 MG capsule  Commonly known as:  CELEBREX      TAKE these medications       aspirin 325 MG EC tablet  Take 325 mg by mouth daily.     bisoprolol 5 MG tablet  Commonly known as:  ZEBETA  Take 1 tablet (5 mg total) by mouth daily before breakfast.     buPROPion 300 MG 24 hr tablet  Commonly known as:  WELLBUTRIN XL  Take 300 mg by mouth daily before breakfast.     diphenoxylate-atropine 2.5-0.025 MG per tablet  Commonly known as:  LOMOTIL  Take 1 tablet by mouth 4 (four) times daily as needed for diarrhea or loose stools.     esomeprazole 40 MG capsule  Commonly known as:  NEXIUM  Take 40 mg by mouth every morning.     estradiol 2 MG tablet  Commonly known as:  ESTRACE  Take 2 mg by mouth daily before breakfast.     furosemide 40 MG tablet  Commonly known as:  LASIX  Take 1 tablet (40 mg total) by  mouth daily.     HYDROcodone-acetaminophen 5-325 MG per tablet  Commonly known as:  NORCO/VICODIN  Take 2 tablets by mouth every 4 (four) hours as needed.     loperamide 2 MG capsule  Commonly known as:  IMODIUM  Take 2 mg by mouth 4 (four) times daily as needed for diarrhea or loose stools.     loratadine-pseudoephedrine 5-120 MG per tablet  Commonly known as:  CLARITIN-D 12-hour  Take 1 tablet by mouth daily as needed for allergies.     montelukast 10 MG tablet  Commonly known as:  SINGULAIR  Take 10 mg by mouth daily as needed (for allergy relief).     ondansetron 4 MG tablet  Commonly known as:  ZOFRAN  Take 4 mg by mouth every 8 (eight) hours as needed for nausea.     promethazine 25 MG tablet  Commonly known as:  PHENERGAN  Take 25 mg by mouth every 6 (six) hours as needed for nausea.     silver nitrate applicators 75-25 % applicator  Apply 1 Stick topically every 5 (five) minutes x 3 doses as needed (Please send 4 doses now/Apply to bleeders in abd incision stat).       Allergies  Allergen Reactions  . Vancomycin Hives and Rash    ? wheezing  . Ativan [Lorazepam]     confusion  . Codeine Nausea And Vomiting  . Tetanus Toxoids Other (See Comments)    serum  . Penicillins Hives and Rash  . Xarelto [Rivaroxaban] Hives and Rash    ?       Follow-up Information   Follow up with Cassell Clement, MD. Schedule an appointment as soon as possible for a visit in 1 week.   Specialty:  Cardiology   Contact information:   [address] [place] [address] [phone]        The results of significant diagnostics from this hospitalization (including imaging, microbiology, ancillary and laboratory) are listed below for reference.    Significant Diagnostic Studies: Dg Chest 2 View  12/22/2012   *RADIOLOGY REPORT*  Clinical Data: Evaluate pulmonary edema.  CHEST - 2 VIEW  Comparison: 12/20/2012  Findings: Normal cardiac silhouette.  There is  bilateral fine air space disease which is improved compared to prior.  Small effusions are present.  No pneumothorax.  IMPRESSION: Improvement in bilateral air space disease.   Original Report Authenticated By: Genevive Bi, M.D.   Dg Chest Portable 1 View  12/20/2012   *RADIOLOGY REPORT*  Clinical Data: Weakness, shortness of breath  PORTABLE CHEST - 1 VIEW  Comparison: 05/29/2012  Findings: The cardiac shadow is stable.  The previously seen PICC line has been removed in the interval.  The lungs are well-aerated but demonstrate diffuse patchy changes consistent with pulmonary edema.  No focal confluent infiltrate is seen.  IMPRESSION: Increased vasculature with parenchymal changes consistent with pulmonary edema.   Original Report Authenticated By: Alcide Clever, M.D.    Microbiology: Recent Results (from the past 240 hour(s))  MRSA PCR SCREENING     Status: Abnormal   Collection Time    12/20/12  7:23 PM      Result Value Range Status   MRSA by PCR POSITIVE (*) NEGATIVE Final   Comment:            The GeneXpert MRSA Assay (FDA     approved for NASAL specimens     only), is one component of a     comprehensive MRSA colonization     surveillance program. It is not     intended to diagnose MRSA     infection nor to guide or     monitor treatment for     MRSA infections.     RESULT CALLED TO, READ BACK BY AND VERIFIED WITH:     LEWIS,S/4E @2217  ON 12/20/12 BY KARCZEWSKI,S.     Labs: Basic Metabolic Panel:  Recent Labs Lab 12/20/12 1400 12/21/12 0447 12/22/12 0505 12/23/12 0643 12/24/12 0630  NA 127* 126* 124* 126* 124*  K 4.8 4.1 4.2 4.2 4.0  CL 97 95* 94* 95* 91*  CO2 19 21 20 21 22   GLUCOSE 84 91 76 94 91  BUN 17 21 21 19  21  CREATININE 1.11* 1.28* 1.19* 1.12* 1.18*  CALCIUM 8.2* 8.1* 7.6* 7.7* 7.9*   Liver Function Tests: No results found for this basename: AST, ALT, ALKPHOS, BILITOT, PROT, ALBUMIN,  in the last 168 hours No results found for this basename: LIPASE,  AMYLASE,  in the last 168 hours No results found for this basename: AMMONIA,  in the last 168 hours CBC:  Recent Labs Lab 12/20/12 1400 12/23/12 0643 12/24/12 0630  WBC 4.6 4.8 5.7  NEUTROABS 2.6  --   --   HGB 7.1* 9.0* 9.3*  HCT 21.4* 25.3* 27.0*  MCV 99.1 95.8 96.1  PLT 291 220 237   Cardiac Enzymes:  Recent Labs Lab 12/20/12 1522  TROPONINI <0.30   BNP: BNP (last 3 results)  Recent Labs  01/04/12 1104 12/20/12 1522  PROBNP 2889.0* 2647.0*   CBG: No results found for this basename: GLUCAP,  in the last 168 hours     Signed:  Inette Doubrava  [facility] 12/24/2012, 11:46 AM

## 2012-12-24 NOTE — Progress Notes (Signed)
Has had continued intermittent bleeding from granulation tissue in chronic abdominal wall wound.  Two areas of venous bleeding, 8 o'clock and 2:30 position, were identified and sutured with 3-0 Vicryl with control of bleeding.  Will see back in office next week.  Will make outpatient Plastic Surgery referral for skin grafting of wound.

## 2012-12-24 NOTE — Progress Notes (Signed)
    SUBJECTIVE: Feeling better. No SOB.   BP 124/79  Pulse 110  Temp(Src) 98 F (36.7 C) (Oral)  Resp 18  Ht 5\' 5"  (1.651 m)  Wt 144 lb 12.8 oz (65.681 kg)  BMI 24.1 kg/m2  SpO2 92%  Intake/Output Summary (Last 24 hours) at 12/24/12 0707 Last data filed at 12/24/12 0530  Gross per 24 hour  Intake    511 ml  Output    650 ml  Net   -139 ml    PHYSICAL EXAM General: Well developed, well nourished, in no acute distress. Alert and oriented x 3.  Psych:  Good affect, responds appropriately Neck: No JVD. No masses noted.  Lungs: Clear bilaterally with no wheezes or rhonci noted.  Heart: RRR with no murmurs noted. Abdomen: Bowel sounds are present. Soft, non-tender.  Extremities: No lower extremity edema.   LABS: Basic Metabolic Panel:  Recent Labs  04/54/09 0505 12/23/12 0643  NA 124* 126*  K 4.2 4.2  CL 94* 95*  CO2 20 21  GLUCOSE 76 94  BUN 21 19  CREATININE 1.19* 1.12*  CALCIUM 7.6* 7.7*   CBC:  Recent Labs  12/23/12 0643 12/24/12 0630  WBC 4.8 5.7  HGB 9.0* 9.3*  HCT 25.3* 27.0*  MCV 95.8 96.1  PLT 220 237   Current Meds: . aspirin  325 mg Oral Daily  . bismuth subsalicylate  30 mL Oral TID AC & HS  . bisoprolol  5 mg Oral QAC breakfast  . buPROPion  300 mg Oral Daily  . Chlorhexidine Gluconate Cloth  6 each Topical Q0600  . enoxaparin (LOVENOX) injection  40 mg Subcutaneous Q24H  . estradiol  2 mg Oral Daily  . furosemide  40 mg Oral BID  . loratadine  10 mg Oral Daily  . mupirocin ointment  1 application Nasal BID  . pantoprazole  40 mg Oral Daily  . pseudoephedrine  120 mg Oral BID  . sodium chloride  3 mL Intravenous Q12H   ASSESSMENT AND PLAN:  1. Acute on chronic diastolic CHF: LV systolic function normal by echo with grade 1 diastolic dysfunction. Volume status near baseline. Negative 1.5 liters since admission. Down 9 lbs since admission. Now on po Lasix. Would discharge home on  Lasix 40 mg po Qdaily. 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 BMET pending this am. Should be ok for discharge from cardiac standpoint with close f/u in our [facility] office with Dr. Ronny Flurry as planned.   2. PAF: Sinus this am.  Not on anticoagulation due to chronic anemia and CMML  [name]  9/8/20147:07 AM

## 2012-12-26 ENCOUNTER — Encounter (INDEPENDENT_AMBULATORY_CARE_PROVIDER_SITE_OTHER): Payer: Self-pay

## 2012-12-26 ENCOUNTER — Telehealth (INDEPENDENT_AMBULATORY_CARE_PROVIDER_SITE_OTHER): Payer: Self-pay

## 2012-12-26 NOTE — Telephone Encounter (Signed)
Left message for patient to call the office.  Patient needs to be informed of appointment with Dr. Kelly Splinter on 01/04/13 @ 9:45am.  They are located at [address], [address]  [place] [address]  Phone [phone]  Records faxed, letter sent to patient's home address.

## 2012-12-31 ENCOUNTER — Telehealth: Payer: Self-pay | Admitting: Cardiology

## 2012-12-31 DIAGNOSIS — Z79899 Other long term (current) drug therapy: Secondary | ICD-10-CM

## 2012-12-31 DIAGNOSIS — D649 Anemia, unspecified: Secondary | ICD-10-CM

## 2012-12-31 NOTE — Telephone Encounter (Signed)
New problem    Patient would like to have lab work . Please advise

## 2012-12-31 NOTE — Telephone Encounter (Signed)
LMOV pt to call for appointment information.

## 2012-12-31 NOTE — Telephone Encounter (Signed)
Patient recently in the hospital and they wanted follow up labs. Patient states has no shortness of breath now.  Scheduled BMET/CBC, will forward to  Dr. Patty Sermons for review.

## 2012-12-31 NOTE — Telephone Encounter (Signed)
Patient aware of appointment with Dr. Kelly Splinter

## 2013-01-02 ENCOUNTER — Other Ambulatory Visit: Payer: BC Managed Care – PPO

## 2013-01-02 NOTE — Telephone Encounter (Signed)
Agree with getting a CBC and a basal metabolic panel

## 2013-01-02 NOTE — Addendum Note (Signed)
Addended by: Regis Bill B on: 01/02/2013 02:27 PM   Modules accepted: Orders

## 2013-01-04 ENCOUNTER — Inpatient Hospital Stay (HOSPITAL_COMMUNITY)
Admission: EM | Admit: 2013-01-04 | Discharge: 2013-01-11 | DRG: 584 | Disposition: A | Discharge status: Home / Self Care | Payer: BC Managed Care – PPO | Attending: Internal Medicine | Admitting: Internal Medicine

## 2013-01-04 ENCOUNTER — Encounter (INDEPENDENT_AMBULATORY_CARE_PROVIDER_SITE_OTHER): Payer: BC Managed Care – PPO | Admitting: General Surgery

## 2013-01-04 ENCOUNTER — Emergency Department (HOSPITAL_COMMUNITY): Payer: BC Managed Care – PPO

## 2013-01-04 ENCOUNTER — Encounter (HOSPITAL_COMMUNITY): Payer: Self-pay | Admitting: Emergency Medicine

## 2013-01-04 ENCOUNTER — Telehealth: Payer: Self-pay | Admitting: Cardiology

## 2013-01-04 DIAGNOSIS — I509 Heart failure, unspecified: Secondary | ICD-10-CM | POA: Diagnosis present

## 2013-01-04 DIAGNOSIS — IMO0002 Reserved for concepts with insufficient information to code with codable children: Secondary | ICD-10-CM

## 2013-01-04 DIAGNOSIS — R0602 Shortness of breath: Secondary | ICD-10-CM

## 2013-01-04 DIAGNOSIS — E861 Hypovolemia: Secondary | ICD-10-CM | POA: Diagnosis present

## 2013-01-04 DIAGNOSIS — Z96649 Presence of unspecified artificial hip joint: Secondary | ICD-10-CM | POA: Diagnosis present

## 2013-01-04 DIAGNOSIS — Z8614 Personal history of Methicillin resistant Staphylococcus aureus infection: Secondary | ICD-10-CM

## 2013-01-04 DIAGNOSIS — E871 Hypo-osmolality and hyponatremia: Secondary | ICD-10-CM | POA: Diagnosis present

## 2013-01-04 DIAGNOSIS — E860 Dehydration: Secondary | ICD-10-CM

## 2013-01-04 DIAGNOSIS — I4891 Unspecified atrial fibrillation: Secondary | ICD-10-CM

## 2013-01-04 DIAGNOSIS — J9600 Acute respiratory failure, unspecified whether with hypoxia or hypercapnia: Secondary | ICD-10-CM | POA: Diagnosis present

## 2013-01-04 DIAGNOSIS — D649 Anemia, unspecified: Secondary | ICD-10-CM | POA: Diagnosis present

## 2013-01-04 DIAGNOSIS — N182 Chronic kidney disease, stage 2 (mild): Secondary | ICD-10-CM | POA: Diagnosis present

## 2013-01-04 DIAGNOSIS — M542 Cervicalgia: Secondary | ICD-10-CM | POA: Diagnosis present

## 2013-01-04 DIAGNOSIS — J189 Pneumonia, unspecified organism: Secondary | ICD-10-CM

## 2013-01-04 DIAGNOSIS — F17200 Nicotine dependence, unspecified, uncomplicated: Secondary | ICD-10-CM | POA: Diagnosis present

## 2013-01-04 DIAGNOSIS — Z7982 Long term (current) use of aspirin: Secondary | ICD-10-CM

## 2013-01-04 DIAGNOSIS — N179 Acute kidney failure, unspecified: Secondary | ICD-10-CM | POA: Diagnosis present

## 2013-01-04 DIAGNOSIS — M129 Arthropathy, unspecified: Secondary | ICD-10-CM | POA: Diagnosis present

## 2013-01-04 DIAGNOSIS — E669 Obesity, unspecified: Secondary | ICD-10-CM | POA: Diagnosis present

## 2013-01-04 DIAGNOSIS — I4892 Unspecified atrial flutter: Secondary | ICD-10-CM | POA: Diagnosis present

## 2013-01-04 DIAGNOSIS — J441 Chronic obstructive pulmonary disease with (acute) exacerbation: Secondary | ICD-10-CM | POA: Diagnosis present

## 2013-01-04 DIAGNOSIS — A419 Sepsis, unspecified organism: Principal | ICD-10-CM | POA: Diagnosis present

## 2013-01-04 DIAGNOSIS — R0600 Dyspnea, unspecified: Secondary | ICD-10-CM

## 2013-01-04 DIAGNOSIS — Z9049 Acquired absence of other specified parts of digestive tract: Secondary | ICD-10-CM

## 2013-01-04 DIAGNOSIS — Z23 Encounter for immunization: Secondary | ICD-10-CM

## 2013-01-04 DIAGNOSIS — J1100 Influenza due to unidentified influenza virus with unspecified type of pneumonia: Secondary | ICD-10-CM | POA: Diagnosis present

## 2013-01-04 DIAGNOSIS — I5031 Acute diastolic (congestive) heart failure: Secondary | ICD-10-CM | POA: Diagnosis present

## 2013-01-04 DIAGNOSIS — I4949 Other premature depolarization: Secondary | ICD-10-CM | POA: Diagnosis present

## 2013-01-04 DIAGNOSIS — E7800 Pure hypercholesterolemia, unspecified: Secondary | ICD-10-CM

## 2013-01-04 DIAGNOSIS — G8929 Other chronic pain: Secondary | ICD-10-CM | POA: Diagnosis present

## 2013-01-04 DIAGNOSIS — Z933 Colostomy status: Secondary | ICD-10-CM

## 2013-01-04 DIAGNOSIS — I119 Hypertensive heart disease without heart failure: Secondary | ICD-10-CM

## 2013-01-04 DIAGNOSIS — I129 Hypertensive chronic kidney disease with stage 1 through stage 4 chronic kidney disease, or unspecified chronic kidney disease: Secondary | ICD-10-CM | POA: Diagnosis present

## 2013-01-04 DIAGNOSIS — C9210 Chronic myeloid leukemia, BCR/ABL-positive, not having achieved remission: Secondary | ICD-10-CM | POA: Diagnosis present

## 2013-01-04 DIAGNOSIS — K219 Gastro-esophageal reflux disease without esophagitis: Secondary | ICD-10-CM | POA: Diagnosis present

## 2013-01-04 DIAGNOSIS — B370 Candidal stomatitis: Secondary | ICD-10-CM | POA: Diagnosis not present

## 2013-01-04 DIAGNOSIS — K551 Chronic vascular disorders of intestine: Secondary | ICD-10-CM

## 2013-01-04 DIAGNOSIS — Z79899 Other long term (current) drug therapy: Secondary | ICD-10-CM

## 2013-01-04 DIAGNOSIS — E538 Deficiency of other specified B group vitamins: Secondary | ICD-10-CM | POA: Diagnosis present

## 2013-01-04 DIAGNOSIS — C9310 Chronic myelomonocytic leukemia not having achieved remission: Secondary | ICD-10-CM | POA: Diagnosis present

## 2013-01-04 LAB — BASIC METABOLIC PANEL
Chloride: 91 mEq/L — ABNORMAL LOW (ref 96–112)
Creatinine, Ser: 1.14 mg/dL — ABNORMAL HIGH (ref 0.50–1.10)
GFR calc Af Amer: 58 mL/min — ABNORMAL LOW (ref >90)
GFR calc non Af Amer: 50 mL/min — ABNORMAL LOW (ref >90)

## 2013-01-04 LAB — CBC
MCV: 94.1 fL (ref 78.0–100.0)
Platelets: 278 10*3/uL (ref 150–400)
RDW: 16.4 % — ABNORMAL HIGH (ref 11.5–15.5)
WBC: 13.2 10*3/uL — ABNORMAL HIGH (ref 4.0–10.5)

## 2013-01-04 LAB — POCT I-STAT TROPONIN I: Troponin i, poc: 0.01 ng/mL (ref 0.00–0.08)

## 2013-01-04 LAB — URINALYSIS, ROUTINE W REFLEX MICROSCOPIC
Bilirubin Urine: NEGATIVE
Hgb urine dipstick: NEGATIVE
Protein, ur: NEGATIVE mg/dL
Urobilinogen, UA: 0.2 mg/dL (ref 0.0–1.0)

## 2013-01-04 LAB — TROPONIN I: Troponin I: <0.3 ng/mL (ref <0.30)

## 2013-01-04 LAB — LACTIC ACID, PLASMA: Lactic Acid, Venous: 1.6 mmol/L (ref 0.5–2.2)

## 2013-01-04 MED ORDER — SODIUM CHLORIDE 0.9 % IJ SOLN: 3.0000 mL | INTRAMUSCULAR | Status: DC | PRN

## 2013-01-04 MED ORDER — HEPARIN SODIUM (PORCINE) 5000 UNIT/ML IJ SOLN
5000.0000 [IU] | Freq: Three times a day (TID) | INTRAMUSCULAR | Status: DC
  Administered 2013-01-05 – 2013-01-06 (×6): 5000 [IU] via SUBCUTANEOUS
  Filled 2013-01-04 (×23): qty 1

## 2013-01-04 MED ORDER — DEXTROSE 5 % IV SOLN
5.0000 mg/h | INTRAVENOUS | Status: DC
  Administered 2013-01-04 – 2013-01-05 (×2): 5 mg/h via INTRAVENOUS

## 2013-01-04 MED ORDER — SODIUM CHLORIDE 0.9 % IV SOLN: 250.0000 mL | INTRAVENOUS | Status: DC | PRN

## 2013-01-04 MED ORDER — SODIUM CHLORIDE 0.9 % IV SOLN
INTRAVENOUS | Status: AC
  Administered 2013-01-04: 19:00:00 via INTRAVENOUS

## 2013-01-04 MED ORDER — DEXTROSE 5 % IV SOLN
500.0000 mg | Freq: Once | INTRAVENOUS | Status: AC
  Administered 2013-01-05: 500 mg via INTRAVENOUS
  Filled 2013-01-04: qty 500

## 2013-01-04 MED ORDER — ONDANSETRON HCL 4 MG/2ML IJ SOLN: 4.0000 mg | Freq: Three times a day (TID) | INTRAMUSCULAR | Status: AC | PRN

## 2013-01-04 MED ORDER — DEXTROSE 5 % IV SOLN: 250.0000 mg | INTRAVENOUS | Status: DC

## 2013-01-04 MED ORDER — SODIUM CHLORIDE 0.9 % IV SOLN
INTRAVENOUS | Status: AC
  Administered 2013-01-04: 23:00:00 via INTRAVENOUS

## 2013-01-04 MED ORDER — FENTANYL CITRATE 0.05 MG/ML IJ SOLN
25.0000 ug | INTRAMUSCULAR | Status: DC | PRN
  Administered 2013-01-04: 50 ug via INTRAVENOUS
  Administered 2013-01-04: 25 ug via INTRAVENOUS
  Administered 2013-01-05 – 2013-01-11 (×20): 50 ug via INTRAVENOUS
  Filled 2013-01-04 (×26): qty 2

## 2013-01-04 MED ORDER — SODIUM CHLORIDE 0.9 % IV SOLN
1.0000 g | Freq: Once | INTRAVENOUS | Status: AC
  Administered 2013-01-04: 1 g via INTRAVENOUS
  Filled 2013-01-04: qty 10

## 2013-01-04 MED ORDER — LINEZOLID 2 MG/ML IV SOLN
600.0000 mg | Freq: Two times a day (BID) | INTRAVENOUS | Status: DC
  Administered 2013-01-04: 600 mg via INTRAVENOUS
  Filled 2013-01-04 (×2): qty 300

## 2013-01-04 MED ORDER — SODIUM CHLORIDE 0.9 % IV BOLUS (SEPSIS)
1000.0000 mL | Freq: Once | INTRAVENOUS | Status: AC
  Administered 2013-01-04: 1000 mL via INTRAVENOUS

## 2013-01-04 MED ORDER — PANTOPRAZOLE SODIUM 40 MG IV SOLR
40.0000 mg | Freq: Every day | INTRAVENOUS | Status: DC
  Administered 2013-01-04: 40 mg via INTRAVENOUS
  Filled 2013-01-04 (×2): qty 40

## 2013-01-04 MED ORDER — SODIUM CHLORIDE 0.9 % IJ SOLN
3.0000 mL | Freq: Two times a day (BID) | INTRAMUSCULAR | Status: DC
  Administered 2013-01-04 – 2013-01-07 (×4): 3 mL via INTRAVENOUS

## 2013-01-04 MED ORDER — DEXTROSE 5 % IV SOLN
2.0000 g | Freq: Three times a day (TID) | INTRAVENOUS | Status: DC
  Administered 2013-01-04 – 2013-01-10 (×19): 2 g via INTRAVENOUS
  Filled 2013-01-04 (×20): qty 2

## 2013-01-04 NOTE — Progress Notes (Signed)
ANTIBIOTIC CONSULT NOTE - INITIAL  Pharmacy Consult for Vancomycin, antibiotic renal dose adjustment Indication: rule out pneumonia  Allergies  Allergen Reactions  . Vancomycin Hives and Rash    ? wheezing  . Ativan [Lorazepam]     confusion  . Codeine Nausea And Vomiting  . Tetanus Toxoids Other (See Comments)    serum  . Penicillins Hives and Rash  . Xarelto [Rivaroxaban] Hives and Rash    ?    Patient Measurements:     Vital Signs: Temp: 98.3 F (36.8 C) (09/19 1430) Temp src: Oral (09/19 1430) BP: 90/56 mmHg (09/19 1550) Pulse Rate: 103 (09/19 1550) Intake/Output from previous day:   Intake/Output from this shift:    Labs:  Recent Labs  01/04/13 1455  WBC 13.2*  HGB 10.5*  PLT 278  CREATININE 1.14*   The CrCl is unknown because both a height and weight (above a minimum accepted value) are required for this calculation. No results found for this basename: VANCOTROUGH, Leodis Binet, VANCORANDOM, GENTTROUGH, GENTPEAK, GENTRANDOM, TOBRATROUGH, TOBRAPEAK, TOBRARND, AMIKACINPEAK, AMIKACINTROU, AMIKACIN,  in the last 72 hours   Microbiology: Recent Results (from the past 720 hour(s))  MRSA PCR SCREENING     Status: Abnormal   Collection Time    12/20/12  7:23 PM      Result Value Range Status   MRSA by PCR POSITIVE (*) NEGATIVE Final   Comment:            The GeneXpert MRSA Assay (FDA     approved for NASAL specimens     only), is one component of a     comprehensive MRSA colonization     surveillance program. It is not     intended to diagnose MRSA     infection nor to guide or     monitor treatment for     MRSA infections.     RESULT CALLED TO, READ BACK BY AND VERIFIED WITH:     LEWIS,S/4E @2217  ON 12/20/12 BY KARCZEWSKI,S.    Medical History: Past Medical History  Diagnosis Date  . Hypertension     She has a past hx of essential  . Elevated liver function tests     She also has a past hx of chronically studies felt to be secondary to Celebrex   . Inflammation of joint of knee     Since we last last saw her she developed problems with an acute which required surgical drainage by her orthopedist Dr. Cleophas Dunker.  Marland Kitchen MRSA (methicillin resistant Staphylococcus aureus)     Knee surgery drainage was positive for MRSA and she was treated with 3 weeks of doxycycline successfully.  . Diarrhea     Mild  . Exogenous obesity   . GERD (gastroesophageal reflux disease)     2 hosp.- ischemic colitis - residual of Norovirus, 05/2011- sm. bowel obstruction  . Headache(784.0)     migraine headache on occas, less now than when she was younger   . Arthritis     L hip, back, neck   . History of blood transfusion sept 2013    04/2011- /w ischemic colitis , trouble with matching blood  sept 2013  . Anemia     will see hematology consult prior to surgery, recommended by Dr. Patty Sermons  . Anemia 12/15/2010  . Ischemic colitis 01/31/2012  . Atrial flutter     during hospitalization, 04/2011- related to anemia & illness/stress   . Pneumonia     04/2011- not hospitalized , pt. denies  SOB, changes in chest, breathing  . 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 Dizziness     occasional     Assessment: 62 yoF c/o SOB and fatigue x 2 weeks following hospital discharge 2 weeks ago for which patient was admitted for acute respiratory failue 2/2 CHF exacerbation.  Active problems during recent admission also included open abdominal wound, anemia, acute renal failure, and possible SIADH.  Pharmacy consulted to start aztreonam for suspected pneumonia.  Patient has allergies to penicillin and vancomycin.  Starting aztreonam for now given recent hospitalization and concern for HCAP.  Currently missing coverage for MRSA.  EDP aware and waiting on CXR to determine if cause of symptoms are pneumonia vs HF exacerbation and will make plan for antibiotics following results.  SCr 1.14, CrCl~52 ml/min  WBC 13.2, Currently afebrile  Goal of Therapy:  Eradication  of infection, doses adjusted per renal clearance  Plan:  Aztreonam 2g IV q8h.  Start now in ED. F/u plan for abx following CXR results.  Clance Boll 01/04/2013,4:23 PM

## 2013-01-04 NOTE — Consult Note (Addendum)
CARDIOLOGY CONSULT NOTE  Patient ID: [name], MRN: [id number], DOB/AGE: May 24, 1950 62 y.o. Admit date: 01/04/2013 Date of Consult: 01/04/2013  Primary Physician:  Primary Cardiologist: Dr. Patty Sermons  Chief Complaint: shortness of breath Reason for Consultation: ?CHF exacerbation, afib with RVR  HPI: 62 y.o. female w/ PMHx significant paroxysmal afib, recent admission for anemia and CHF with preserved EF, ischemic colitis s/p colectomy and ostomy  who presented to [facility] on 01/04/2013 with complaints of 1 day of shortness of breath. She was recently discharged 2 weeks ago after being admitted with progressive CHF symptoms of orthopnea and PND and was hypoxic. Diuresed in the hospital and discharged. Her CHF symptoms improved but she still didn't feel well (fatigue). Past 2 days she felt mildly dizzy which she thought was due to dehydration so she held her lasix today. Compliant with bisoprolol. Presented to [facility] ER hypoxic, hypotensive and tachycardic. Given 1 L of fluid and started on a dilt gtt for afib with RVR.  She states no subjective fevers though measured here at 101.2. She states this presentation is different and she thinks this feels more like bronchitis rather than the CHF. No LE swelling. Feels fatigue and dyspneic with exertion. No sick contacts. Doesn't really feel her afib with RVR. No chest pain, nausea, vomiting.  Previously has had bleeding issues with her ostomy site which is healing but not recently.  EKG demonstrates afib with rvr to 124, pvc, LAD. No ST changes from prior. Currently, telemetry demonstrates sinus at 100.   Noted in the ER notes that bedside echo demonstrated collapsible IVC.   Past Medical History  Diagnosis Date  . Hypertension     She has a past hx of essential  . 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 Inflammation of joint of knee     Since we last last saw  her she developed problems with an acute which required surgical drainage by her orthopedist Dr. Cleophas Dunker.  Marland Kitchen MRSA (methicillin resistant Staphylococcus aureus)     Knee surgery drainage was positive for MRSA and she was treated with 3 weeks of doxycycline successfully.  . Diarrhea     Mild  . Exogenous obesity   . GERD (gastroesophageal reflux disease)     2 hosp.- ischemic colitis - residual of Norovirus, 05/2011- sm. bowel obstruction  . Headache(784.0)     migraine headache on occas, less now than when she was younger   . Arthritis     L hip, back, neck   . History of blood transfusion sept 2013    04/2011- /w ischemic colitis , trouble with matching blood  sept 2013  . Anemia     will see hematology consult prior to surgery, recommended by Dr. Patty Sermons  . Anemia 12/15/2010  . Ischemic colitis 01/31/2012  . Atrial flutter     during hospitalization, 04/2011- related to anemia & illness/stress   . Pneumonia     04/2011- not hospitalized , pt. denies SOB, changes in chest, breathing  . 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 Dizziness     occasional      Surgical History:  Past Surgical History  Procedure Laterality Date  . Knee surgery      I&D- 2008, post laceration   . Colonoscopy  05/16/2011    Procedure: COLONOSCOPY;  Surgeon: Vertell Novak., MD;  Location: [facility];  Service: Endoscopy;  Laterality: N/A;  . Small intestine surgery  1992, 1999  . Laparotomy and lysis of adhesions    . Total hip arthroplasty  10/25/2011    Procedure: TOTAL HIP ARTHROPLASTY;  Surgeon: Valeria Batman, MD;  Location: [facility] OR;  Service: Orthopedics;  Laterality: Left;  . Appendectomy  1962  . Abdominal hysterectomy  1988  . Partial colectomy and colostomy  sept 2013    mucous fistula done  . Partial colectomy  05/17/2012    Procedure: PARTIAL COLECTOMY;  Surgeon: Adolph Pollack, MD;  Location: [facility] ORS;  Service: General;  Laterality: N/A;  . Colostomy closure  05/17/2012     Procedure: COLOSTOMY CLOSURE;  Surgeon: Adolph Pollack, MD;  Location: [facility];  Service: General;  Laterality: N/A;  . Laparotomy  05/17/2012    Procedure: EXPLORATORY LAPAROTOMY;  Surgeon: Adolph Pollack, MD;  Location: [facility];  Service: General;;  . Lysis of adhesion  05/17/2012    Procedure: LYSIS OF ADHESION;  Surgeon: Adolph Pollack, MD;  Location: [facility];  Service: General;;  . Esophageal biopsy  05/17/2012    Procedure: BIOPSY;  Surgeon: Adolph Pollack, MD;  Location: [facility];  Service: General;;  omental biopsy  . Laparotomy  05/22/2012    Procedure: EXPLORATORY LAPAROTOMY;  Surgeon: Adolph Pollack, MD;  Location: [facility];  Service: General;  Laterality: N/A;  DRAINAGE  INTRA-ABDOMINAL ABSCESS/LYSIS OF ADHESIONSFOR SMALL BOWEL OBSTRUCTION/DIVERTING LOOP ILEOSTOMY     Home Meds: Prior to Admission medications   Medication Sig Start Date End Date Taking? Authorizing Provider  albuterol (PROVENTIL HFA;VENTOLIN HFA) 108 (90 BASE) MCG/ACT inhaler Inhale 1-2 puffs into the lungs every 6 (six) hours as needed for wheezing.   Yes Historical Provider, MD  aspirin 325 MG EC tablet Take 325 mg by mouth daily.   Yes Historical Provider, MD  bisoprolol (ZEBETA) 5 MG tablet Take 1 tablet (5 mg total) by mouth daily before breakfast. 08/17/12  Yes Cassell Clement, MD  buPROPion (WELLBUTRIN XL) 300 MG 24 hr tablet Take 300 mg by mouth daily before breakfast.    Yes Historical Provider, MD  celecoxib (CELEBREX) 200 MG capsule Take 200 mg by mouth daily.   Yes Historical Provider, MD  diphenoxylate-atropine (LOMOTIL) 2.5-0.025 MG per tablet Take 1 tablet by mouth at bedtime.    Yes Historical Provider, MD  esomeprazole (NEXIUM) 40 MG capsule Take 40 mg by mouth every morning.    Yes Historical Provider, MD  estradiol (ESTRACE) 2 MG tablet Take 2 mg by mouth daily before breakfast.    Yes Historical Provider, MD  furosemide (LASIX) 40 MG tablet Take 1 tablet (40 mg total) by mouth daily. 12/24/12   Yes Kathlen Mody, MD  HYDROcodone-acetaminophen (NORCO/VICODIN) 5-325 MG per tablet Take 2 tablets by mouth every 4 (four) hours as needed. 12/24/12  Yes Kathlen Mody, MD  loperamide (IMODIUM) 2 MG capsule Take 2 mg by mouth 4 (four) times daily as needed for diarrhea or loose stools.   Yes Historical Provider, MD  loratadine-pseudoephedrine (CLARITIN-D 12-HOUR) 5-120 MG per tablet Take 1 tablet by mouth daily as needed for allergies.    Yes Historical Provider, MD  ondansetron (ZOFRAN) 4 MG tablet Take 4 mg by mouth every 8 (eight) hours as needed for nausea.   Yes Historical Provider, MD  promethazine (PHENERGAN) 25 MG tablet Take 25 mg by mouth every 6 (six) hours as needed for nausea.   Yes Historical Provider, MD  silver nitrate applicators 75-25 %  applicator Apply 1 Stick topically every 5 (five) minutes x 3 doses as needed (Please send 4 doses now/Apply to bleeders in abd incision stat). 12/24/12  Yes Kathlen Mody, MD    Inpatient Medications:  . sodium chloride   Intravenous STAT  . aztreonam  2 g Intravenous Q8H  . linezolid  600 mg Intravenous Q12H  . pantoprazole (PROTONIX) IV  40 mg Intravenous QHS  . sodium chloride  3 mL Intravenous Q12H   . diltiazem (CARDIZEM) infusion 15 mg/hr (01/04/13 2035)    Allergies:  Allergies  Allergen Reactions  . Vancomycin Hives and Rash    ? wheezing  . Ativan [Lorazepam]     confusion  . Codeine Nausea And Vomiting  . Tetanus Toxoids Other (See Comments)    serum  . Penicillins Hives and Rash  . Xarelto [Rivaroxaban] Hives and Rash    ?    History   Social History  . Marital Status: Single    Spouse Name: N/A    Number of Children: 3  . Years of Education: N/A   Occupational History  .      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 Smoking status: Current Some Day Smoker -- 0.25 packs/day for 20 years    Types: Cigarettes  . Smokeless tobacco: Never Used  . Alcohol Use: Yes     Comment: 1  - 2 glasses of wine per week  . Drug Use: No  . Sexual Activity: Not Currently   Other Topics Concern  . Not on file   Social History Narrative  . No narrative on file     Family History  Problem Relation Age of Onset  . Stroke Father   . Atrial fibrillation Father   . Hypertension Mother      Review of Systems: General: negative for chills, fever, night sweats or weight changes.  Cardiovascular: See HPI. Dermatological: negative for rash Respiratory: negative for cough or wheezing Urologic: negative for hematuria Abdominal: negative for nausea, vomiting, diarrhea, bright red blood per rectum, melena, or hematemesis Neurologic: negative for visual changes, syncope. +Dizziness All other systems reviewed and are otherwise negative except as noted above.  Labs:  Recent Labs  01/04/13 1455  TROPONINI <0.30   Lab Results  Component Value Date   WBC 13.2* 01/04/2013   HGB 10.5* 01/04/2013   HCT 30.4* 01/04/2013   MCV 94.1 01/04/2013   PLT 278 01/04/2013    Recent Labs Lab 01/04/13 1455  NA 125*  K 3.8  CL 91*  CO2 17*  BUN 23  CREATININE 1.14*  CALCIUM 7.3*  GLUCOSE 90   Lab Results  Component Value Date   CHOL 108 05/28/2012   HDL 92.50 12/15/2010   LDLCALC  Value: 115        Total Cholesterol/HDL:CHD Risk Coronary Heart Disease Risk Table                     Men   Women  1/2 Average Risk   3.4   3.3* 07/20/2007   TRIG 105 05/28/2012    Radiology/Studies:  Dg Chest 2 View  01/04/2013   CLINICAL DATA:  Cough and congestion; fever  EXAM: CHEST  2 VIEW  COMPARISON:  December 22, 2012  FINDINGS: There is widespread reticulonodular interstitial disease throughout the mid and lower lung zones, essentially stable. A few areas of patchy airspace disease are seen in these areas as well.  No new opacity is seen. Heart size and pulmonary vascularity are normal. No adenopathy. No bone lesions.  IMPRESSION: Extensive reticulonodular interstitial disease and patchy airspace disease  in the mid and lower lung zones. Differential considerations include atypical infectious appearance, toxin/ medication reactive change, or atypical congestive heart failure. No appreciable change is noted compared to most recent prior study.   Electronically Signed   By: Bretta Bang   On: 01/04/2013 17:03   Dg Chest 2 View  12/22/2012   *RADIOLOGY REPORT*  Clinical Data: Evaluate pulmonary edema.  CHEST - 2 VIEW  Comparison: 12/20/2012  Findings: Normal cardiac silhouette.  There is bilateral fine air space disease which is improved compared to prior.  Small effusions are present.  No pneumothorax.  IMPRESSION: Improvement in bilateral air space disease.   Original Report Authenticated By: Genevive Bi, M.D.   Dg Chest Portable 1 View  12/20/2012   *RADIOLOGY REPORT*  Clinical Data: Weakness, shortness of breath  PORTABLE CHEST - 1 VIEW  Comparison: 05/29/2012  Findings: The cardiac shadow is stable.  The previously seen PICC line has been removed in the interval.  The lungs are well-aerated but demonstrate diffuse patchy changes consistent with pulmonary edema.  No focal confluent infiltrate is seen.  IMPRESSION: Increased vasculature with parenchymal changes consistent with pulmonary edema.   Original Report Authenticated By: Alcide Clever, M.D.    EKG: afib with RVR in the 120s, LAD, no ST changes  Physical Exam: Blood pressure 104/68, pulse 79, temperature 97.9 F (36.6 C), temperature source Oral, resp. rate 27, SpO2 93.00%. General: Well developed, coughing Head: Normocephalic, atraumatic, sclera non-icteric, no xanthomas, nares are without discharge.  Neck: Supple.JVD not elevated at 30 degs. Lungs: central rhoncherous breath sounds. Heart: RRR with S1 S2. No murmurs, rubs, or gallops appreciated. Abdomen: ostomy in place, wound c/d/i Msk:  Strength and tone appear normal for age. Extremities: No clubbing or cyanosis. No edema.  Distal pedal pulses are 2+, extremities coolish Neuro:  Alert and oriented X 3. Moves all extremities spontaneously. Psych:  Responds to questions appropriately with a normal affect.   Problem List 1. Shortness of breath 2. Fever, white count, atypical cxray 3. Elevated BNP 4. Afib with RVR, no resolved 5. Hypotension 6. H/o CHF with preserved EF 7. Anemia 8. H/o CMML  Assessment and Plan:  62 y.o. female w/ PMHx significant paroxysmal afib, recent admission for anemia and CHF with preserved EF, ischemic colitis s/p colectomy and ostomy  who presented to [facility] on 01/04/2013 with complaints of shortness of breath --> found to be in afib with RVR.  Her presentation certainly is a bit of a mixed picture: no evidence of right sided heart failure (no edema, JVP normal, fast scan by ER showed collapsing IVC) and symptoms more consistent with an infectious process (has productive cough, bronchial breath sounds, fever) and a cxray with an atypical pattern but the one outlier is a BNP of 7000. Known history of CHF with normal EF by echo on 12/2012 but presentation and symptoms currently different.  At this point, would favor an infectious pulmonary process and agree with antibiotics. Will defer to ICU/pulmonary team regarding whether additional imaging (CT) would be helpful to further delineate. Agree with holding lasix and cautious fluid boluses as needed for blood pressure. No need for serial troponins. Strict I/Os and serial weights would be helpful.  Her paroxysmal afib with RVR currently is resolved and due to hypotension, have discussed with nursing regarding backing off  on dilt gtt. Not on anticoagualation due to history of bleeding issues from ostomy side and recent need for transfusions but with an elevated CHADS2 score, this may need to be revisited in the future. Continue aspirin for stroke risk reduction.  Thank you for this consult. Please call with questions. Hand cardiology will follow up in the AM.   Signed, Shaquetta Arcos,  Reeva Davern C. MD 01/04/2013, 9:25 PM

## 2013-01-04 NOTE — ED Notes (Signed)
Aware Cardizem  Drip pending

## 2013-01-04 NOTE — ED Notes (Signed)
Urine seen upon inserting foley but has not yet drained into bag

## 2013-01-04 NOTE — ED Notes (Signed)
MD at bedside. 

## 2013-01-04 NOTE — ED Notes (Signed)
Pt refused Troponin - pt doesn't want to get stuck anymore.  Dr Jodi Mourning is aware of this and that she refused the second set of blood cultures.

## 2013-01-04 NOTE — ED Notes (Signed)
Went to get second set of blood cultures - pt refused.  RN Morrie Sheldon aware.

## 2013-01-04 NOTE — H&P (Signed)
PULMONARY  / CRITICAL CARE MEDICINE  Name: [name] MRN: [id number] DOB: January 11, 1951    ADMISSION DATE:  01/04/2013 CONSULTATION DATE:  01/04/2013  REFERRING MD :  Dr. Jodi Mourning PRIMARY SERVICE: CCM  CHIEF COMPLAINT:  SOB  BRIEF PATIENT DESCRIPTION: 73 F with CMML, Aflutter, colostomy presenting with SOB in setting of new productive cough following recent hospitalization.  SIGNIFICANT EVENTS / STUDIES:  1. BNP 7057 2. WBC 5's baseline --> 13.2  LINES / TUBES: 1. PIV  CULTURES: 1. None   ANTIBIOTICS: 1. Linezolid 9/19- 2. Aztreonam 9/19-  HISTORY OF PRESENT ILLNESS:  [name] is a 32 F with CMML (Chart Hx, no elevated WBC, no notes), Aflutter, ischemic colitis s/p colectomy with ostomy who presents to [facility] this evening with SOB x several days. She was recently discharged from the hospital for fluid overload in the setting of acute blood loss of unclear etiology. The last several days she has noted some increased SOB as well as a cough which began productive of whitish sputum this evening. She denies recent fevers or sick contacts but has had some chills recently. She denies frank chest pain but had some infrasternal pressure on and off the last few days. She was found to be in Afib with RVR in the ED; she did not note palpitations but does not usually feel them when in an arrythmia.  PAST MEDICAL HISTORY :  Past Medical History  Diagnosis Date  . Hypertension     She has a past hx of essential  . 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 Inflammation of joint of knee     Since we last last saw her she developed problems with an acute which required surgical drainage by her orthopedist Dr. Cleophas Dunker.  Marland Kitchen MRSA (methicillin resistant Staphylococcus aureus)     Knee surgery drainage was positive for MRSA and she was treated with 3 weeks of doxycycline successfully.  . Diarrhea     Mild  . Exogenous obesity    . GERD (gastroesophageal reflux disease)     2 hosp.- ischemic colitis - residual of Norovirus, 05/2011- sm. bowel obstruction  . Headache(784.0)     migraine headache on occas, less now than when she was younger   . Arthritis     L hip, back, neck   . History of blood transfusion sept 2013    04/2011- /w ischemic colitis , trouble with matching blood  sept 2013  . Anemia     will see hematology consult prior to surgery, recommended by Dr. Patty Sermons  . Anemia 12/15/2010  . Ischemic colitis 01/31/2012  . Atrial flutter     during hospitalization, 04/2011- related to anemia & illness/stress   . Pneumonia     04/2011- not hospitalized , pt. denies SOB, changes in chest, breathing  . 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 Dizziness     occasional    Past Surgical History  Procedure Laterality Date  . Knee surgery      I&D- 2008, post laceration   . Colonoscopy  05/16/2011    Procedure: COLONOSCOPY;  Surgeon: Vertell Novak., MD;  Location: [facility];  Service: Endoscopy;  Laterality: N/A;  . Small intestine surgery  1992, 1999  . Laparotomy and lysis of adhesions    . Total hip arthroplasty  10/25/2011    Procedure: TOTAL HIP ARTHROPLASTY;  Surgeon: Valeria Batman,  MD;  Location: [facility];  Service: Orthopedics;  Laterality: Left;  . Appendectomy  1962  . Abdominal hysterectomy  1988  . Partial colectomy and colostomy  sept 2013    mucous fistula done  . Partial colectomy  05/17/2012    Procedure: PARTIAL COLECTOMY;  Surgeon: Adolph Pollack, MD;  Location: [facility];  Service: General;  Laterality: N/A;  . Colostomy closure  05/17/2012    Procedure: COLOSTOMY CLOSURE;  Surgeon: Adolph Pollack, MD;  Location: [facility];  Service: General;  Laterality: N/A;  . Laparotomy  05/17/2012    Procedure: EXPLORATORY LAPAROTOMY;  Surgeon: Adolph Pollack, MD;  Location: [facility];  Service: General;;  . Lysis of adhesion  05/17/2012    Procedure: LYSIS OF ADHESION;  Surgeon: Adolph Pollack, MD;  Location: [facility];  Service: General;;  . Esophageal biopsy  05/17/2012    Procedure: BIOPSY;  Surgeon: Adolph Pollack, MD;  Location: [facility];  Service: General;;  omental biopsy  . Laparotomy  05/22/2012    Procedure: EXPLORATORY LAPAROTOMY;  Surgeon: Adolph Pollack, MD;  Location: [facility];  Service: General;  Laterality: N/A;  DRAINAGE  INTRA-ABDOMINAL ABSCESS/LYSIS OF ADHESIONSFOR SMALL BOWEL OBSTRUCTION/DIVERTING LOOP ILEOSTOMY    Prior to Admission medications   Medication Sig Start Date End Date Taking? Authorizing Provider  albuterol (PROVENTIL HFA;VENTOLIN HFA) 108 (90 BASE) MCG/ACT inhaler Inhale 1-2 puffs into the lungs every 6 (six) hours as needed for wheezing.   Yes Historical Provider, MD  aspirin 325 MG EC tablet Take 325 mg by mouth daily.   Yes Historical Provider, MD  bisoprolol (ZEBETA) 5 MG tablet Take 1 tablet (5 mg total) by mouth daily before breakfast. 08/17/12  Yes Cassell Clement, MD  buPROPion (WELLBUTRIN XL) 300 MG 24 hr tablet Take 300 mg by mouth daily before breakfast.    Yes Historical Provider, MD  celecoxib (CELEBREX) 200 MG capsule Take 200 mg by mouth daily.   Yes Historical Provider, MD  diphenoxylate-atropine (LOMOTIL) 2.5-0.025 MG per tablet Take 1 tablet by mouth at bedtime.    Yes Historical Provider, MD  esomeprazole (NEXIUM) 40 MG capsule Take 40 mg by mouth every morning.    Yes Historical Provider, MD  estradiol (ESTRACE) 2 MG tablet Take 2 mg by mouth daily before breakfast.    Yes Historical Provider, MD  furosemide (LASIX) 40 MG tablet Take 1 tablet (40 mg total) by mouth daily. 12/24/12  Yes Kathlen Mody, MD  HYDROcodone-acetaminophen (NORCO/VICODIN) 5-325 MG per tablet Take 2 tablets by mouth every 4 (four) hours as needed. 12/24/12  Yes Kathlen Mody, MD  loperamide (IMODIUM) 2 MG capsule Take 2 mg by mouth 4 (four) times daily as needed for diarrhea or loose stools.   Yes Historical Provider, MD  loratadine-pseudoephedrine  (CLARITIN-D 12-HOUR) 5-120 MG per tablet Take 1 tablet by mouth daily as needed for allergies.    Yes Historical Provider, MD  ondansetron (ZOFRAN) 4 MG tablet Take 4 mg by mouth every 8 (eight) hours as needed for nausea.   Yes Historical Provider, MD  promethazine (PHENERGAN) 25 MG tablet Take 25 mg by mouth every 6 (six) hours as needed for nausea.   Yes Historical Provider, MD  silver nitrate applicators 75-25 % applicator Apply 1 Stick topically every 5 (five) minutes x 3 doses as needed (Please send 4 doses now/Apply to bleeders in abd incision stat). 12/24/12  Yes Kathlen Mody, MD    Allergies  Allergen Reactions  . Vancomycin  Hives and Rash    ? wheezing  . Ativan [Lorazepam]     confusion  . Codeine Nausea And Vomiting  . Tetanus Toxoids Other (See Comments)    serum  . Penicillins Hives and Rash  . Xarelto [Rivaroxaban] Hives and Rash    ?    FAMILY HISTORY:  Family History  Problem Relation Age of Onset  . Stroke Father   . Atrial fibrillation Father   . Hypertension Mother     SOCIAL HISTORY:  reports that she has been smoking Cigarettes.  She has a 5 pack-year smoking history. She has never used smokeless tobacco. She reports that  drinks alcohol. She reports that she does not use illicit drugs.  REVIEW OF SYSTEMS:  A 12-system ROS was conducted and, unless otherwise specified in the HPI, was negative.   PHYSICAL EXAM  VITAL SIGNS: Temp:  [97.9 F (36.6 C)-101.2 F (38.4 C)] 97.9 F (36.6 C) (09/19 2000) Pulse Rate:  [29-121] 121 (09/19 2200) Resp:  [19-31] 31 (09/19 2200) BP: (62-108)/(31-91) 97/52 mmHg (09/19 2200) SpO2:  [80 %-96 %] 95 % (09/19 2200)  HEMODYNAMICS:    VENTILATOR SETTINGS:    INTAKE / OUTPUT: Intake/Output     09/19 0701 - 09/20 0700   I.V. 1810   Total Intake 1810   Net +1810         PHYSICAL EXAMINATION: General:  Elderly F in NAD Neuro:  Intact HEENT:  MM mildly dry, Sclera anicteric. Conjunctiva pale. Neck:  Trachea  supple and midline. (-) LAN. Mild bilateral shotty LAN on the left cervical chain Cardiovascular:  RRR, NS1/S2, (-) MRG2 Lungs:  Decreased BS, no rales or rhonichi Abdomen:  S/NT/ND/(+)BS Musculoskeletal:  (-) C/C/E Skin:  (-) Rashes  LABS:  CBC Recent Labs     01/04/13  1455  WBC  13.2*  HGB  10.5*  HCT  30.4*  PLT  278    Coag's No results found for this basename: APTT, INR,  in the last 72 hours  BMET Recent Labs     01/04/13  1455  NA  125*  K  3.8  CL  91*  CO2  17*  BUN  23  CREATININE  1.14*  GLUCOSE  90    Electrolytes Recent Labs     01/04/13  1455  CALCIUM  7.3*    Sepsis Markers No results found for this basename: LACTICACIDVEN, PROCALCITON, O2SATVEN,  in the last 72 hours  ABG No results found for this basename: PHART, PCO2ART, PO2ART,  in the last 72 hours  Liver Enzymes No results found for this basename: AST, ALT, ALKPHOS, BILITOT, ALBUMIN,  in the last 72 hours  Cardiac Enzymes Recent Labs     01/04/13  1455  TROPONINI  <0.30  PROBNP  7057.0*    Glucose No results found for this basename: GLUCAP,  in the last 72 hours  Imaging Dg Chest 2 View  01/04/2013   CLINICAL DATA:  Cough and congestion; fever  EXAM: CHEST  2 VIEW  COMPARISON:  December 22, 2012  FINDINGS: There is widespread reticulonodular interstitial disease throughout the mid and lower lung zones, essentially stable. A few areas of patchy airspace disease are seen in these areas as well. No new opacity is seen. Heart size and pulmonary vascularity are normal. No adenopathy. No bone lesions.  IMPRESSION: Extensive reticulonodular interstitial disease and patchy airspace disease in the mid and lower lung zones. Differential considerations include atypical infectious appearance, toxin/ medication reactive change,  or atypical congestive heart failure. No appreciable change is noted compared to most recent prior study.   Electronically Signed   By: Bretta Bang   On:  01/04/2013 17:03    EKG: EKG from tonight was personally reviewed by me. Afib with RVR, PVCs, no acute ST changes. CXR: Chest X-ray from tonight was personally reviewed by me. There are bibasilar opacities superimposed on a reticular pattern.   ASSESSMENT / PLAN: Principal Problem:   Sepsis associated hypotension Active Problems:   Atrial fibrillation with rapid ventricular response   CMML (chronic myelomonocytic leukemia)   Acute renal failure   Acute respiratory failure   Sepsis   Acute diastolic heart failure   PULMONARY A: 1. Acute respiratory failure: This is most likely 2/2 HCAP given the patient's history, X-ray, and elevated white count. The BNP does not fit well, however, and raises the possibility of PE vs. Acute heart failure (not supported by exam). She has always had a borderline abnormal chest X-ray, especially in the bases and I wonder if there is a component of aspiration as well.   P:    HCAP therapy with aztreonam, Linezolid, and azithromycin  Sputum cultures  Check LE dopplers  Repeat BNP   CARDIOVASCULAR A:  1. Afib with RVR: This is most likely 2/2 the acute infection. Appreciate cardiology input. Patient not anticoagulated 2/2 bleeding in the past. 2. Severe sepsis: Meets SIRS criteria in setting of sepsis with hypotension. Likely 2/2 pneumonia.  P:   DC cardizem  IVF (Gentle)  Serial trops  RENAL A:  1. CKD: Cr at baseline  P:    Monitor  GASTROINTESTINAL A: 1. No Acute Issue 2. Ostomy/Abdominal Wound:   P:    Continue wound care  HEMATOLOGIC A:   1. CMML: Unclear why diagnosis in patient's chart   INFECTIOUS A: 1. HCAP:   P:    Sputum culture, antibiotics, blood culture  ENDOCRINE A:    No acute issue  NEUROLOGIC A:  No acute issue  BEST PRACTICE / DISPOSITION Level of Care:  ICU  Consultants:  Cardiology Code Status:  Full Diet:  Regular DVT Px:  SQH GI Px:  Not indicated Skin Integrity:   Intact Social / Family:  PAtient updated  TODAY'S SUMMARY:   I have personally obtained a history, examined the patient, evaluated laboratory and imaging results, formulated the assessment and plan and placed orders.  CRITICAL CARE: The patient is critically ill with multiple organ systems failure and requires high complexity decision making for assessment and support, frequent evaluation and titration of therapies, application of advanced monitoring technologies and extensive interpretation of multiple databases. Critical Care Time devoted to patient care services described in this note is 30 minutes.   Evalyn Casco, MD Pulmonary and Critical Care Medicine [facility] Pager: [phone]  01/04/2013, 10:31 PM

## 2013-01-04 NOTE — ED Notes (Signed)
Pt c/o of SOB and fatigue x2 weeks states that she was discharged from hospital 2 weeks ago with anemia and CHF. Also c/o of neck pain. Hx of ileostomy. NAD at this time.

## 2013-01-04 NOTE — Telephone Encounter (Signed)
New Problem  Pt's son called... requests a referral to Dr. Danielle Dess for severe neck pain/// she has been seen by this Dr. before. Request a call back to discuss.

## 2013-01-04 NOTE — Telephone Encounter (Signed)
Patient was admitted to hos. Last week.  She is experiencing the same symptoms.  [name] would like for you to call him.

## 2013-01-04 NOTE — ED Notes (Signed)
Requesting something for nausea [name] made aware

## 2013-01-04 NOTE — Progress Notes (Signed)
eLink Physician-Brief Progress Note Patient Name: [name] DOB: 07/31/1950 MRN: [id number]  Date of Service  01/04/2013   HPI/Events of Note  Best Practice  eICU Interventions  Subq Heparin for DVT propy   Intervention Category Intermediate Interventions: Best-practice therapies (e.g. DVT, beta blocker, etc.)  DETERDING,ELIZABETH 01/04/2013, 11:45 PM

## 2013-01-04 NOTE — Telephone Encounter (Signed)
Spoke with patient and she had labs scheduled this week but has not felt up to coming in. Has been dizzy the last couple of days and with her low sodium she thought she would hold her Lasix today. Now she is having increased shortness of breath but denies any swelling. Patient states feels more like a pneumonia type thing. Discussed with  Dr. Patty Sermons and patients recent low hgb, low sodium, and shortness of breath he recommended her going to ED. Advised patient and she felt that's what she needed to do as well.

## 2013-01-04 NOTE — ED Notes (Signed)
Patient transported to X-ray 

## 2013-01-04 NOTE — ED Provider Notes (Signed)
CSN: [id number]     Arrival date & time 01/04/13  1410 History   First MD Initiated Contact with Patient 01/04/13 1457     Chief Complaint  Patient presents with  . 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  Productive cough.  Worse with exertion.  No orthopnea or weight gain.  Pt on lasix until a few days ago when she stopped because she felt weak and dehydration.  Recent echo normal EF.  No blood clot hx, recent surgery, leg swelling/ pain, travel hx.   Multiple drug allergies.  Nothing improves sxs.  Chills recently.    Patient is a 62 y.o. female presenting with shortness of breath. The history is provided by the patient and a relative.  Shortness of Breath Severity:  Moderate Associated symptoms: cough   Associated symptoms: no abdominal pain, no chest pain, no fever, no headaches, no neck pain, no rash and no vomiting     Past Medical History  Diagnosis Date  . Hypertension     She has a past hx of essential  . 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 Inflammation of joint of knee     Since we last last saw her she developed problems with an acute which required surgical drainage by her orthopedist Dr. Cleophas Dunker.  Marland Kitchen MRSA (methicillin resistant Staphylococcus aureus)     Knee surgery drainage was positive for MRSA and she was treated with 3 weeks of doxycycline successfully.  . Diarrhea     Mild  . Exogenous obesity   . GERD (gastroesophageal reflux disease)     2 hosp.- ischemic colitis - residual of Norovirus, 05/2011- sm. bowel obstruction   . Headache(784.0)     migraine headache on occas, less now than when she was younger   . Arthritis     L hip, back, neck   . History of blood transfusion sept 2013    04/2011- /w ischemic colitis , trouble with matching blood  sept 2013  . Anemia     will see hematology consult prior to surgery, recommended by Dr. Patty Sermons  . Anemia 12/15/2010  . Ischemic colitis 01/31/2012  . Atrial flutter     during hospitalization, 04/2011- related to anemia & illness/stress   . Pneumonia     04/2011- not hospitalized , pt. denies SOB, changes in chest, breathing  . 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 Dizziness     occasional   Past Surgical History  Procedure Laterality Date  . Knee surgery      I&D- 2008, post laceration   . Colonoscopy  05/16/2011    Procedure: COLONOSCOPY;  Surgeon: Vertell Novak., MD;  Location: [facility];  Service: Endoscopy;  Laterality: N/A;  . Small intestine surgery  1992, 1999  . Laparotomy and lysis of adhesions    . Total hip arthroplasty  10/25/2011    Procedure: TOTAL HIP ARTHROPLASTY;  Surgeon: Valeria Batman, MD;  Location: [facility] OR;  Service: Orthopedics;  Laterality: Left;  . Appendectomy  1962  . Abdominal hysterectomy  1988  . Partial colectomy and colostomy  sept 2013    mucous  fistula done  . Partial colectomy  05/17/2012    Procedure: PARTIAL COLECTOMY;  Surgeon: Adolph Pollack, MD;  Location: [facility]S;  Service: General;  Laterality: N/A;  . Colostomy closure  05/17/2012    Procedure: COLOSTOMY CLOSURE;  Surgeon: Adolph Pollack, MD;  Location: [facility];  Service: General;  Laterality: N/A;  . Laparotomy  05/17/2012    Procedure: EXPLORATORY LAPAROTOMY;  Surgeon: Adolph Pollack, MD;  Location: [facility];  Service: General;;  . Lysis of adhesion  05/17/2012    Procedure: LYSIS OF ADHESION;  Surgeon: Adolph Pollack, MD;  Location: [facility];  Service: General;;  . Esophageal biopsy  05/17/2012    Procedure: BIOPSY;  Surgeon: Adolph Pollack, MD;  Location: [facility];  Service: General;;  omental biopsy  . Laparotomy  05/22/2012    Procedure: EXPLORATORY LAPAROTOMY;  Surgeon: Adolph Pollack, MD;  Location: [facility];  Service: General;  Laterality: N/A;  DRAINAGE  INTRA-ABDOMINAL ABSCESS/LYSIS OF ADHESIONSFOR SMALL BOWEL OBSTRUCTION/DIVERTING LOOP ILEOSTOMY   Family History  Problem Relation Age of Onset  . Stroke Father   . Atrial fibrillation Father   . Hypertension Mother    History  Substance Use Topics  . Smoking status: Current Some Day Smoker -- 0.25 packs/day for 20 years    Types: Cigarettes  . Smokeless tobacco: Never Used  . Alcohol Use: Yes     Comment: 1 - 2 glasses of wine per week   OB History   Grav Para Term Preterm Abortions TAB SAB Ect Mult Living                 Review of Systems  Constitutional: Positive for chills, appetite change and fatigue. Negative for fever.  HENT: Negative for neck pain and neck stiffness.   Eyes: Negative for visual disturbance.  Respiratory: Positive for cough and shortness of breath.   Cardiovascular: Negative for chest pain.  Gastrointestinal: Positive for nausea. Negative for vomiting, abdominal pain and blood in stool.  Genitourinary: Negative for dysuria and flank pain.  Musculoskeletal: Negative for back pain.  Skin: Negative for rash.  Neurological: Positive for light-headedness. Negative for headaches.    Allergies  Vancomycin; Ativan; Codeine; Tetanus toxoids; Penicillins; and Xarelto  Home Medications   Current Outpatient Rx  Name  Route  Sig  Dispense  Refill  . albuterol (PROVENTIL HFA;VENTOLIN HFA) 108 (90 BASE) MCG/ACT inhaler   Inhalation   Inhale 1-2 puffs into the lungs every 6 (six) hours as needed for wheezing.         Marland Kitchen aspirin 325 MG EC tablet   Oral   Take 325 mg by mouth daily.         . bisoprolol (ZEBETA) 5 MG tablet   Oral   Take 1 tablet (5 mg total) by mouth daily before breakfast.   90 tablet   3   . buPROPion  (WELLBUTRIN XL) 300 MG 24 hr tablet   Oral   Take 300 mg by mouth daily before breakfast.          . celecoxib (CELEBREX) 200 MG capsule   Oral   Take 200 mg by mouth daily.         . diphenoxylate-atropine (LOMOTIL) 2.5-0.025 MG per tablet   Oral   Take 1 tablet by mouth at bedtime.          Marland Kitchen esomeprazole (NEXIUM) 40 MG capsule   Oral   Take 40 mg by mouth every morning.          Marland Kitchen  estradiol (ESTRACE) 2 MG tablet   Oral   Take 2 mg by mouth daily before breakfast.          . furosemide (LASIX) 40 MG tablet   Oral   Take 1 tablet (40 mg total) by mouth daily.   30 tablet   1   . HYDROcodone-acetaminophen (NORCO/VICODIN) 5-325 MG per tablet   Oral   Take 2 tablets by mouth every 4 (four) hours as needed.   30 tablet   0   . loperamide (IMODIUM) 2 MG capsule   Oral   Take 2 mg by mouth 4 (four) times daily as needed for diarrhea or loose stools.         Marland Kitchen loratadine-pseudoephedrine (CLARITIN-D 12-HOUR) 5-120 MG per tablet   Oral   Take 1 tablet by mouth daily as needed for allergies.          Marland Kitchen ondansetron (ZOFRAN) 4 MG tablet   Oral   Take 4 mg by mouth every 8 (eight) hours as needed for nausea.         . promethazine (PHENERGAN) 25 MG tablet   Oral   Take 25 mg by mouth every 6 (six) hours as needed for nausea.         . silver nitrate applicators 75-25 % applicator   Topical   Apply 1 Stick topically every 5 (five) minutes x 3 doses as needed (Please send 4 doses now/Apply to bleeders in abd incision stat).   100 each   0    BP 90/56  Pulse 103  Temp(Src) 98.3 F (36.8 C) (Oral)  Resp 26  SpO2 86% Physical Exam  Nursing note and vitals reviewed. Constitutional: She is oriented to person, place, and time. She appears well-developed and well-nourished.  HENT:  Head: Normocephalic and atraumatic.  Dry mm  Eyes: Conjunctivae are normal. Right eye exhibits no discharge. Left eye exhibits no discharge.  Neck: Normal range of motion.  Neck supple. No tracheal deviation present.  Cardiovascular: Regular rhythm.  Tachycardia present.   Pulses:      Radial pulses are 2+ on the right side, and 2+ on the left side.  Pulmonary/Chest: Effort normal. She has rales (worse right lower/ middle).  Abdominal: Soft. She exhibits no distension. There is no tenderness. There is no guarding.  Post op dehiscence pink mucosa, non tender, no warmth or erythema, ileostomy light brown color  Musculoskeletal: She exhibits no edema and no tenderness.  Neurological: She is alert and oriented to person, place, and time.  Skin: Skin is warm. No rash noted.  Psychiatric: She has a normal mood and affect.    ED Course  Procedures (including critical care time) CRITICAL CARE Performed by: Enid Skeens   Total critical care time: 50 min  Critical care time was exclusive of separately billable procedures and treating other patients.  Critical care was necessary to treat or prevent imminent or life-threatening deterioration.  Critical care was time spent personally by me on the following activities: development of treatment plan with patient and/or surrogate as well as nursing, discussions with consultants, evaluation of patient's response to treatment, examination of patient, obtaining history from patient or surrogate, ordering and performing treatments and interventions, ordering and review of laboratory studies, ordering and review of radiographic studies, pulse oximetry and re-evaluation of patient's condition.    EMERGENCY DEPARTMENT [place] CARDIAC EXAM "Study: Limited Ultrasound of the heart and pericardium"  INDICATIONS:Hypotension, Tachycardia and Dyspnea Multiple views of the heart and  pericardium were obtained in real-time with a multi-frequency probe.  PERFORMED GE:[id number]  IMAGES ARCHIVED?: Yes  FINDINGS: No pericardial effusion, Normal contractility and Tamponade physiology absent  LIMITATIONS:  Body habitus  VIEWS USED:  Parasternal long axis, Parasternal short axis, Apical 4 chamber  and Inferior Vena Cava  INTERPRETATION: Cardiac activity present, Pericardial effusioin absent, Cardiac tamponade absent, Probable elevated CVP and Normal contractility Emergency Ultrasound Study:   Angiocath insertion Performed by: Enid Skeens  Consent: Verbal consent obtained. Risks and benefits: risks, benefits and alternatives were discussed Immediately prior to procedure the correct patient, procedure, equipment, support staff and site/side marked as needed.  Indication: difficult IV access Preparation: Patient was prepped and draped in the usual sterile fashion. Vein Location: Right AC vein was visualized during assessment for potential access sites and was found to be patent/ easily compressed with linear ultrasound.  The needle was visualized with real-time ultrasound and guided into the vein. Gauge: 20 g long, right AC  Image saved and stored.  Normal blood return.  Patient tolerance: Patient tolerated the procedure well with no immediate complications.  Emergency Ultrasound: Limited Thoracic Performed and interpreted by Dr Jodi Mourning Longitudinal view of anterior left and right lung fields in real-time with linear probe. Indication: dyspnea Findings: bilateral lung sliding  B lines present bilateral/ congestion Interpretation: no evidence of pneumothorax. CHF vs pneumonia Images electronically archived.     Labs Review Labs Reviewed  CBC - Abnormal; Notable for the following:    WBC 13.2 (*)    RBC 3.23 (*)    Hemoglobin 10.5 (*)    HCT 30.4 (*)    RDW 16.4 (*)    All other components within normal limits  BASIC METABOLIC PANEL - Abnormal; Notable for the following:    Sodium 125 (*)    Chloride 91 (*)    CO2 17 (*)    Creatinine, Ser 1.14 (*)    Calcium 7.3 (*)    GFR calc non Af Amer 50 (*)    GFR calc Af Amer 58 (*)    All other components within normal limits  PRO B NATRIURETIC PEPTIDE -  Abnormal; Notable for the following:    Pro B Natriuretic peptide (BNP) 7057.0 (*)    All other components within normal limits  CULTURE, BLOOD (ROUTINE X 2)  CULTURE, BLOOD (ROUTINE X 2)  TROPONIN I  LACTIC ACID, PLASMA  URINALYSIS, ROUTINE W REFLEX MICROSCOPIC   Imaging Review No results found.  MDM  No diagnosis found. Hypotensive and tachycardic.  Initially a fib with rvr, with productive cough, recent normal EF and no weight gain/ leg swelling, dry mm and Pt really thirsty concern for pneumonia however CHF on differential with hx/ afib.  Pt hr clinically regular, repeat EKG showed sinus tach.   Date: 01/05/2013  Rate: 110  Rhythm: sinus tachycardia  QRS Axis: left  Intervals: QT prolonged  ST/T Wave abnormalities: nonspecific ST changes  Conduction Disutrbances:none  Narrative Interpretation:   Old EKG Reviewed: changes noted Repeat due to bp change and dyspnea  Date: 01/05/2013  Rate: 119  Rhythm: atrial fibrillation  QRS Axis: left  Intervals: QT prolonged  ST/T Wave abnormalities: nonspecific ST changes  Conduction Disutrbances:left anterior fascicular block  Narrative Interpretation:   Old EKG Reviewed: changes noted   Date: 01/05/2013  Rate: 127  Rhythm: atrial fibrillation  QRS Axis: left  Intervals: QT prolonged  ST/T Wave abnormalities: nonspecific ST changes  Conduction Disutrbances:left anterior fascicular block  Narrative Interpretation:  Old EKG Reviewed: changes noted   Delay in cxr, held fluids after 1 L as may be CHF and bnp elevated.  Mixed picture, bedside US showed congested lungs, IVC did collapse with respiration and gross good EF. Difficult IV, US guided. Contact radiology for CXR.  Cardiology consulted, pt has seen [place]. Recheck pt comfortable, no orthopnea, vitals similar to arrival.   Spoke with pharmacy with possible pneumonia/ recent hospitalization/ delay in cxr aztreonam ordered, vanco allergy.  Updated family on plan.  BP  ranging from 70 to 90s in ED.  BP improved with cautious fluids.  Diltiazem drip ordered. Pt admitted to ICU.  Sepsis, A fib rvr, Hypoxia, pneumonia, ARF, Hyponatremia   Enid Skeens, MD 01/05/13 [phone]

## 2013-01-04 NOTE — ED Notes (Addendum)
Spoke with Dr. Jodi Mourning and requested correction to foley order

## 2013-01-05 ENCOUNTER — Inpatient Hospital Stay (HOSPITAL_COMMUNITY): Payer: BC Managed Care – PPO

## 2013-01-05 DIAGNOSIS — I4891 Unspecified atrial fibrillation: Secondary | ICD-10-CM

## 2013-01-05 DIAGNOSIS — J189 Pneumonia, unspecified organism: Secondary | ICD-10-CM

## 2013-01-05 DIAGNOSIS — M549 Dorsalgia, unspecified: Secondary | ICD-10-CM

## 2013-01-05 DIAGNOSIS — R0602 Shortness of breath: Secondary | ICD-10-CM

## 2013-01-05 DIAGNOSIS — A419 Sepsis, unspecified organism: Secondary | ICD-10-CM

## 2013-01-05 DIAGNOSIS — G8929 Other chronic pain: Secondary | ICD-10-CM

## 2013-01-05 DIAGNOSIS — J9600 Acute respiratory failure, unspecified whether with hypoxia or hypercapnia: Secondary | ICD-10-CM

## 2013-01-05 LAB — CBC
HCT: 28.4 % — ABNORMAL LOW (ref 36.0–46.0)
Hemoglobin: 9.6 g/dL — ABNORMAL LOW (ref 12.0–15.0)
MCV: 92.2 fL (ref 78.0–100.0)
Platelets: 178 10*3/uL (ref 150–400)
RBC: 3.08 MIL/uL — ABNORMAL LOW (ref 3.87–5.11)
WBC: 9.3 10*3/uL (ref 4.0–10.5)

## 2013-01-05 LAB — BASIC METABOLIC PANEL
BUN: 25 mg/dL — ABNORMAL HIGH (ref 6–23)
CO2: 17 mEq/L — ABNORMAL LOW (ref 19–32)
Chloride: 93 mEq/L — ABNORMAL LOW (ref 96–112)
Creatinine, Ser: 1.16 mg/dL — ABNORMAL HIGH (ref 0.50–1.10)
Potassium: 3.6 mEq/L (ref 3.5–5.1)

## 2013-01-05 LAB — PRO B NATRIURETIC PEPTIDE: Pro B Natriuretic peptide (BNP): 5651 pg/mL — ABNORMAL HIGH (ref 0–125)

## 2013-01-05 MED ORDER — LINEZOLID 600 MG PO TABS
600.0000 mg | ORAL_TABLET | Freq: Two times a day (BID) | ORAL | Status: DC
  Administered 2013-01-05 – 2013-01-10 (×11): 600 mg via ORAL
  Filled 2013-01-05 (×14): qty 1

## 2013-01-05 MED ORDER — LOPERAMIDE HCL 2 MG PO CAPS
2.0000 mg | ORAL_CAPSULE | Freq: Four times a day (QID) | ORAL | Status: DC | PRN
  Administered 2013-01-05 – 2013-01-06 (×5): 2 mg via ORAL
  Filled 2013-01-05 (×5): qty 1

## 2013-01-05 MED ORDER — DIPHENOXYLATE-ATROPINE 2.5-0.025 MG PO TABS
1.0000 | ORAL_TABLET | Freq: Every evening | ORAL | Status: DC | PRN
  Administered 2013-01-05 – 2013-01-09 (×3): 1 via ORAL
  Filled 2013-01-05 (×3): qty 1

## 2013-01-05 MED ORDER — HYDROCODONE-ACETAMINOPHEN 7.5-325 MG/15ML PO SOLN
10.0000 mL | ORAL | Status: DC | PRN
  Administered 2013-01-05 – 2013-01-08 (×7): 10 mL via ORAL
  Filled 2013-01-05 (×7): qty 15

## 2013-01-05 MED ORDER — SODIUM CHLORIDE 0.9 % IV SOLN
INTRAVENOUS | Status: DC
  Administered 2013-01-05: 75 mL/h via INTRAVENOUS
  Administered 2013-01-06: 22:00:00 via INTRAVENOUS

## 2013-01-05 MED ORDER — PANTOPRAZOLE SODIUM 40 MG PO TBEC
40.0000 mg | DELAYED_RELEASE_TABLET | Freq: Every day | ORAL | Status: DC
  Administered 2013-01-05 – 2013-01-11 (×7): 40 mg via ORAL
  Filled 2013-01-05 (×8): qty 1

## 2013-01-05 MED ORDER — INFLUENZA VAC SPLIT QUAD 0.5 ML IM SUSP
0.5000 mL | INTRAMUSCULAR | Status: AC
  Administered 2013-01-06: 0.5 mL via INTRAMUSCULAR
  Filled 2013-01-05 (×2): qty 0.5

## 2013-01-05 MED ORDER — BISOPROLOL FUMARATE 10 MG PO TABS
10.0000 mg | ORAL_TABLET | Freq: Every day | ORAL | Status: DC
  Administered 2013-01-05: 10 mg via ORAL
  Filled 2013-01-05 (×2): qty 1

## 2013-01-05 NOTE — Progress Notes (Signed)
VASCULAR LAB PRELIMINARY  PRELIMINARY  PRELIMINARY  PRELIMINARY  Bilateral lower extremity venous Dopplers completed.    Preliminary report:  There is no DVT or SVT noted in the bilateral lower extremities.  Arav Bannister, RVT 01/05/2013, 10:19 AM

## 2013-01-05 NOTE — Progress Notes (Signed)
Subjective:  Feels better, no CP, no significant SOB. Dizziness improved with IVF.   Marland Kitchen azithromycin  250 mg Intravenous Q24H  . aztreonam  2 g Intravenous Q8H  . heparin subcutaneous  5,000 Units Subcutaneous Q8H  . linezolid  600 mg Intravenous Q12H  . pantoprazole (PROTONIX) IV  40 mg Intravenous QHS  . sodium chloride  3 mL Intravenous Q12H   Prior to Admission medications   Medication Sig Start Date End Date Taking? Authorizing Provider  albuterol (PROVENTIL HFA;VENTOLIN HFA) 108 (90 BASE) MCG/ACT inhaler Inhale 1-2 puffs into the lungs every 6 (six) hours as needed for wheezing.   Yes Historical Provider, MD  aspirin 325 MG EC tablet Take 325 mg by mouth daily.   Yes Historical Provider, MD  bisoprolol (ZEBETA) 5 MG tablet Take 1 tablet (5 mg total) by mouth daily before breakfast. 08/17/12  Yes Cassell Clement, MD  buPROPion (WELLBUTRIN XL) 300 MG 24 hr tablet Take 300 mg by mouth daily before breakfast.    Yes Historical Provider, MD  celecoxib (CELEBREX) 200 MG capsule Take 200 mg by mouth daily.   Yes Historical Provider, MD  diphenoxylate-atropine (LOMOTIL) 2.5-0.025 MG per tablet Take 1 tablet by mouth at bedtime.    Yes Historical Provider, MD  esomeprazole (NEXIUM) 40 MG capsule Take 40 mg by mouth every morning.    Yes Historical Provider, MD  estradiol (ESTRACE) 2 MG tablet Take 2 mg by mouth daily before breakfast.    Yes Historical Provider, MD  furosemide (LASIX) 40 MG tablet Take 1 tablet (40 mg total) by mouth daily. 12/24/12  Yes Kathlen Mody, MD  HYDROcodone-acetaminophen (NORCO/VICODIN) 5-325 MG per tablet Take 2 tablets by mouth every 4 (four) hours as needed. 12/24/12  Yes Kathlen Mody, MD  loperamide (IMODIUM) 2 MG capsule Take 2 mg by mouth 4 (four) times daily as needed for diarrhea or loose stools.   Yes Historical Provider, MD  loratadine-pseudoephedrine (CLARITIN-D 12-HOUR) 5-120 MG per tablet Take 1 tablet by mouth daily as needed for allergies.    Yes Historical  Provider, MD  ondansetron (ZOFRAN) 4 MG tablet Take 4 mg by mouth every 8 (eight) hours as needed for nausea.   Yes Historical Provider, MD  promethazine (PHENERGAN) 25 MG tablet Take 25 mg by mouth every 6 (six) hours as needed for nausea.   Yes Historical Provider, MD  silver nitrate applicators 75-25 % applicator Apply 1 Stick topically every 5 (five) minutes x 3 doses as needed (Please send 4 doses now/Apply to bleeders in abd incision stat). 12/24/12  Yes Kathlen Mody, MD    Objective:  Vital Signs in the last 24 hours: Temp:  [97.9 F (36.6 C)-101.2 F (38.4 C)] 98.3 F (36.8 C) (09/20 0400) Pulse Rate:  [29-121] 77 (09/20 0630) Resp:  [19-31] 19 (09/20 0630) BP: (62-108)/(31-91) 96/54 mmHg (09/20 0630) SpO2:  [80 %-100 %] 100 % (09/20 0630) Weight:  [66 kg (145 lb 8.1 oz)] 66 kg (145 lb 8.1 oz) (09/20 0300)  Intake/Output from previous day: 09/19 0701 - 09/20 0700 In: 3120 [I.V.:2520; IV Piggyback:600] Out: 501 [Urine:500; Stool:1]   Physical Exam:  Blood pressure 104/68, pulse 79, temperature 97.9 F (36.6 C), temperature source Oral, resp. rate 27, SpO2 93.00%.  General: Well developed, coughing  Head: Normocephalic, atraumatic, sclera non-icteric, no xanthomas, nares are without discharge.  Neck: Supple.JVD not elevated at 30 degs.  Lungs: central rhoncherous breath sounds.  Heart: RRR with S1 S2. No murmurs, rubs, or gallops appreciated.  Abdomen: ostomy in place, wound c/d/i  Msk: Strength and tone appear normal for age.  Extremities: No clubbing or cyanosis. No edema. Distal pedal pulses are 2+, extremities coolish  Neuro: Alert and oriented X 3. Moves all extremities spontaneously.  Psych: Responds to questions appropriately with a normal affect.      Lab Results:  Recent Labs  01/04/13 1455 01/05/13 0340  WBC 13.2* 9.3  HGB 10.5* 9.6*  PLT 278 178    Recent Labs  01/04/13 1455 01/05/13 0340  NA 125* 124*  K 3.8 3.6  CL 91* 93*  CO2 17* 17*   GLUCOSE 90 117*  BUN 23 25*  CREATININE 1.14* 1.16*    Recent Labs  01/04/13 1455  TROPONINI <0.30    Imaging: Dg Chest 2 View  01/04/2013   CLINICAL DATA:  Cough and congestion; fever  EXAM: CHEST  2 VIEW  COMPARISON:  December 22, 2012  FINDINGS: There is widespread reticulonodular interstitial disease throughout the mid and lower lung zones, essentially stable. A few areas of patchy airspace disease are seen in these areas as well. No new opacity is seen. Heart size and pulmonary vascularity are normal. No adenopathy. No bone lesions.  IMPRESSION: Extensive reticulonodular interstitial disease and patchy airspace disease in the mid and lower lung zones. Differential considerations include atypical infectious appearance, toxin/ medication reactive change, or atypical congestive heart failure. No appreciable change is noted compared to most recent prior study.   Electronically Signed   By: Bretta Bang   On: 01/04/2013 17:03   Dg Chest Port 1 View  01/05/2013   *RADIOLOGY REPORT*  Clinical Data: CHF, history of hypertension  PORTABLE CHEST - 1 VIEW  Comparison: Prior radiograph from 01/04/2013  Findings: Cardiomegaly is stable as compared to the prior examination.  The current examination has been performed with a similar degree of lung inflation.  There has been interval worsening of diffuse pulmonary vascular congestion and peribronchial cuffing, suggestive of worsening essentially edema. Small bilateral pleural effusions are present.  No pneumothorax.  No infiltrates identified.  No acute osseous abnormality.  IMPRESSION: 1.  Interval worsening of pulmonary vascular congestion and for with diffuse interstitial prominence and peribronchial cuffing, suggestive of worsening atypical congestive heart failure. Possible atypical infection could also have this appearance. 2.  Small bilateral pleural effusions.   Original Report Authenticated By: Rise Mu, M.D.   Personally viewed.    Telemetry: PAF 120's now NSR Personally viewed.   EKG:  afib with RVR in the 120s, LAD, no ST changes  Cardiac Studies:  ECHO 12/21/12: - Left ventricle: The cavity size was normal. Systolic function was normal. The estimated ejection fraction was in the range of 60% to 65%. Wall motion was normal; there were no regional wall motion abnormalities. Doppler parameters are consistent with abnormal left ventricular relaxation (grade 1 diastolic dysfunction). Doppler parameters are consistent with high ventricular filling pressure. - Aortic root: The aortic root was mildly dilated. - Mitral valve: Calcified annulus.  Problem List  1. Shortness of breath  2. Fever, white count, atypical cxray  3. Elevated BNP  4. Afib with RVR, no resolved  5. Hypotension  6. H/o CHF with preserved EF  7. Anemia  8. H/o CMML  Assessment/Plan:   62 y.o. female w/ PMHx significant paroxysmal afib, recent admission for anemia and CHF with preserved EF, ischemic colitis s/p colectomy and ostomy who presented [facility] on [facility]/04/2013 with complaints of shortness of breath --> found to  be in afib with RVR. Currently NSR.  -agree with consult note plan, Dr. Adolm Joseph -dilt gtt low dose, hypotension. When demonstrates continued stability d/c. -was on low dose beta blocker at home. May transition back to this when stable.  -Hydration, IVF, vasodilatory shock -abx-PNA -recent reassuring EF. -No full dose anticoagulation due to bleeding risk. -Likely component of diastolic HF with pulmonary process and AFIB with RVR, however when admitted clinically hypovolemic. I will place on NS 75 HR.  -Recent admission.  -Hyponatremia noted.  -Complex medical decision making. Discussed with nursing.   [name] 01/05/2013, 8:15 AM

## 2013-01-05 NOTE — Progress Notes (Signed)
PULMONARY  / CRITICAL CARE MEDICINE  Name: [name] MRN: [id number] DOB: 1950-04-27    ADMISSION DATE:  01/04/2013 CONSULTATION DATE:  01/04/2013  REFERRING MD :  Dr. Jodi Mourning PRIMARY SERVICE: CCM  CHIEF COMPLAINT:  SOB  BRIEF PATIENT DESCRIPTION: 62 F with CMML, Aflutter, colostomy presenting with SOB in setting of new productive cough following recent hospitalization.  SIGNIFICANT EVENTS / STUDIES:  1. BNP 7057 2. WBC 5's baseline --> 13.2  LINES / TUBES: 1. PIV  CULTURES: 9/19 UC>> 9/19 BCx2>> 9/19 resp cult >>  ANTIBIOTICS: 1. Linezolid 9/19-change to PO 9/20>> 2. Aztreonam 9/19-  Subjective: Pt feels better this am.    PHYSICAL EXAM  VITAL SIGNS: Temp:  [97.9 F (36.6 C)-101.2 F (38.4 C)] 98.3 F (36.8 C) (09/20 0800) Pulse Rate:  [29-121] 102 (09/20 0800) Resp:  [19-31] 24 (09/20 0800) BP: (62-108)/(31-91) 87/52 mmHg (09/20 0800) SpO2:  [80 %-100 %] 96 % (09/20 0800) Weight:  [66 kg (145 lb 8.1 oz)] 66 kg (145 lb 8.1 oz) (09/20 0300)  HEMODYNAMICS: Soft Bp    [place] oxygen     INTAKE / OUTPUT: Intake/Output     09/19 0701 - 09/20 0700 09/20 0701 - 09/21 0700   I.V. (mL/kg) 2545 (38.6) 35 (0.5)   IV Piggyback 600 50   Total Intake(mL/kg) 3145 (47.7) 85 (1.3)   Urine (mL/kg/hr) 500 400 (1.8)   Stool 1    Total Output 501 400   Net +2644 -315          PHYSICAL EXAMINATION: General:  Elderly F in NAD Neuro:  Intact HEENT:  MM mildly dry, Sclera anicteric. Conjunctiva pale. Neck:  Trachea supple and midline. (-) LAN. Mild bilateral shotty LAN on the left cervical chain Cardiovascular:  RRR, NS1/S2, (-) MRG2 Lungs:  Decreased BS, no rales or rhonichi Abdomen:  S/NT/ND/(+)BS Musculoskeletal:  (-) C/C/E Skin:  (-) Rashes  LABS:  CBC Recent Labs     01/04/13  1455  01/05/13  0340  WBC  13.2*  9.3  HGB  10.5*  9.6*  HCT  30.4*  28.4*  PLT  278  178    Coag's No results found for this basename: APTT, INR,  in the last 72  hours  BMET Recent Labs     01/04/13  1455  01/05/13  0340  NA  125*  124*  K  3.8  3.6  CL  91*  93*  CO2  17*  17*  BUN  23  25*  CREATININE  1.14*  1.16*  GLUCOSE  90  117*    Electrolytes Recent Labs     01/04/13  1455  01/05/13  0340  CALCIUM  7.3*  6.7*    Sepsis Markers No results found for this basename: LACTICACIDVEN, PROCALCITON, O2SATVEN,  in the last 72 hours  ABG No results found for this basename: PHART, PCO2ART, PO2ART,  in the last 72 hours  Liver Enzymes No results found for this basename: AST, ALT, ALKPHOS, BILITOT, ALBUMIN,  in the last 72 hours  Cardiac Enzymes Recent Labs     01/04/13  1455  01/05/13  0345  TROPONINI  <0.30   --   PROBNP  7057.0*  5651.0*    Glucose No results found for this basename: GLUCAP,  in the last 72 hours  Imaging Dg Chest 2 View  01/04/2013   CLINICAL DATA:  Cough and congestion; fever  EXAM: CHEST  2 VIEW  COMPARISON:  December 22, 2012  FINDINGS:  There is widespread reticulonodular interstitial disease throughout the mid and lower lung zones, essentially stable. A few areas of patchy airspace disease are seen in these areas as well. No new opacity is seen. Heart size and pulmonary vascularity are normal. No adenopathy. No bone lesions.  IMPRESSION: Extensive reticulonodular interstitial disease and patchy airspace disease in the mid and lower lung zones. Differential considerations include atypical infectious appearance, toxin/ medication reactive change, or atypical congestive heart failure. No appreciable change is noted compared to most recent prior study.   Electronically Signed   By: Bretta Bang   On: 01/04/2013 17:03   Dg Chest Port 1 View  01/05/2013   *RADIOLOGY REPORT*  Clinical Data: CHF, history of hypertension  PORTABLE CHEST - 1 VIEW  Comparison: Prior radiograph from 01/04/2013  Findings: Cardiomegaly is stable as compared to the prior examination.  The current examination has been performed with a  similar degree of lung inflation.  There has been interval worsening of diffuse pulmonary vascular congestion and peribronchial cuffing, suggestive of worsening essentially edema. Small bilateral pleural effusions are present.  No pneumothorax.  No infiltrates identified.  No acute osseous abnormality.  IMPRESSION: 1.  Interval worsening of pulmonary vascular congestion and for with diffuse interstitial prominence and peribronchial cuffing, suggestive of worsening atypical congestive heart failure. Possible atypical infection could also have this appearance. 2.  Small bilateral pleural effusions.   Original Report Authenticated By: Rise Mu, M.D.    EKG: EKG from tonight was personally reviewed by me. Afib with RVR, PVCs, no acute ST changes. CXR: 9/20: pulm edema  ASSESSMENT / PLAN: Principal Problem:   Severe sepsis Active Problems:   Atrial fibrillation with rapid ventricular response   CMML (chronic myelomonocytic leukemia)   Acute respiratory failure   PULMONARY A: Acute respiratory failure: This is most likely 2/2 HCAP , also superimposed pulm edema though pt is dry clinically and got volume overnight P:    HCAP therapy with aztreonam, Linezolid(change to PO)  Sputum cultures  Check LE dopplers>>>NEG  Repeat BNP>>5000   CARDIOVASCULAR A:  1. Afib with RVR: This is most likely 2/2 the acute infection. Appreciate cardiology input. Patient not anticoagulated 2/2 bleeding in the past. 2. Severe sepsis: Meets SIRS criteria in setting of sepsis with hypotension. Likely 2/2 pneumonia.  P:   DC cardizem drip  IVF >>reduce to [facility]  Serial trops>>>NORMAL  RENAL A:  1. CKD: Cr at baseline  P:    Monitor  GASTROINTESTINAL A: 1. No Acute Issue 2. Ostomy/Abdominal Wound:   P:    Continue wound care daily  HEMATOLOGIC A:   1. CMML: Unclear why diagnosis in patient's chart   INFECTIOUS A: 1. HCAP: NOS  P:    Sputum culture, antibiotics, blood  culture as above  ENDOCRINE A:    No acute issue  NEUROLOGIC A:  No acute issue  TODAY'S SUMMARY:  62 y.o.F with acute diastolic CHF, HCAP, SIRS, Sepsis, volume sensitive. Plan : reduce ivf kvo, d/c dilt drip, resume BB, hold lasix, change zyvox to po, cont iv azactam, d/c azithromycin.  May need PICC line ,poor iv access Keep in ICU.  I have personally obtained a history, examined the patient, evaluated laboratory and imaging results, formulated the assessment and plan and placed orders.  CRITICAL CARE: The patient is critically ill with multiple organ systems failure and requires high complexity decision making for assessment and support, frequent evaluation and titration of therapies, application of advanced monitoring technologies and extensive interpretation  of multiple databases. Critical Care Time devoted to patient care services described in this note is 30 minutes.   Dorcas Carrow Beeper  [phone]  Cell  [phone]  If no response or cell goes to voicemail, call beeper [phone]  01/05/2013, 10:26 AM

## 2013-01-06 ENCOUNTER — Inpatient Hospital Stay (HOSPITAL_COMMUNITY): Payer: BC Managed Care – PPO

## 2013-01-06 DIAGNOSIS — J441 Chronic obstructive pulmonary disease with (acute) exacerbation: Secondary | ICD-10-CM | POA: Diagnosis present

## 2013-01-06 LAB — CBC
HCT: 28.1 % — ABNORMAL LOW (ref 36.0–46.0)
Hemoglobin: 9.4 g/dL — ABNORMAL LOW (ref 12.0–15.0)
MCH: 30.9 pg (ref 26.0–34.0)
MCHC: 33.5 g/dL (ref 30.0–36.0)
MCV: 92.4 fL (ref 78.0–100.0)
Platelets: 221 10*3/uL (ref 150–400)
RBC: 3.04 MIL/uL — ABNORMAL LOW (ref 3.87–5.11)
WBC: 6 10*3/uL (ref 4.0–10.5)

## 2013-01-06 LAB — EXPECTORATED SPUTUM ASSESSMENT W GRAM STAIN, RFLX TO RESP C

## 2013-01-06 LAB — URINE CULTURE
Culture: NO GROWTH
Special Requests: NORMAL

## 2013-01-06 LAB — EXPECTORATED SPUTUM ASSESSMENT W REFEX TO RESP CULTURE: Special Requests: NORMAL

## 2013-01-06 LAB — BASIC METABOLIC PANEL
CO2: 20 mEq/L (ref 19–32)
Calcium: 6.7 mg/dL — ABNORMAL LOW (ref 8.4–10.5)
Chloride: 95 mEq/L — ABNORMAL LOW (ref 96–112)
Glucose, Bld: 99 mg/dL (ref 70–99)
Potassium: 3.5 mEq/L (ref 3.5–5.1)
Sodium: 125 mEq/L — ABNORMAL LOW (ref 135–145)

## 2013-01-06 MED ORDER — ESTRADIOL 2 MG PO TABS
2.0000 mg | ORAL_TABLET | Freq: Every day | ORAL | Status: DC
  Administered 2013-01-06 – 2013-01-11 (×6): 2 mg via ORAL
  Filled 2013-01-06 (×7): qty 1

## 2013-01-06 MED ORDER — TIOTROPIUM BROMIDE MONOHYDRATE 18 MCG IN CAPS
18.0000 ug | ORAL_CAPSULE | Freq: Every day | RESPIRATORY_TRACT | Status: DC
  Administered 2013-01-06 – 2013-01-11 (×6): 18 ug via RESPIRATORY_TRACT
  Filled 2013-01-06 (×2): qty 5

## 2013-01-06 MED ORDER — BISOPROLOL FUMARATE 5 MG PO TABS
15.0000 mg | ORAL_TABLET | Freq: Every day | ORAL | Status: DC
  Administered 2013-01-06 – 2013-01-08 (×3): 15 mg via ORAL
  Filled 2013-01-06 (×5): qty 1

## 2013-01-06 MED ORDER — LEVALBUTEROL HCL 0.63 MG/3ML IN NEBU
0.6300 mg | INHALATION_SOLUTION | Freq: Four times a day (QID) | RESPIRATORY_TRACT | Status: DC
  Administered 2013-01-06 – 2013-01-10 (×12): 0.63 mg via RESPIRATORY_TRACT
  Filled 2013-01-06 (×27): qty 3

## 2013-01-06 MED ORDER — POTASSIUM CHLORIDE 10 MEQ/100ML IV SOLN: 10.0000 meq | INTRAVENOUS | Status: DC

## 2013-01-06 MED ORDER — ASPIRIN 325 MG PO TABS
325.0000 mg | ORAL_TABLET | Freq: Every day | ORAL | Status: DC
  Administered 2013-01-06 – 2013-01-11 (×6): 325 mg via ORAL
  Filled 2013-01-06 (×7): qty 1

## 2013-01-06 MED ORDER — POTASSIUM CHLORIDE CRYS ER 20 MEQ PO TBCR
20.0000 meq | EXTENDED_RELEASE_TABLET | ORAL | Status: AC
  Administered 2013-01-06 (×2): 20 meq via ORAL
  Filled 2013-01-06 (×2): qty 1

## 2013-01-06 NOTE — Progress Notes (Signed)
Subjective:  Difficult to sleep. Mild SOB. Mild palps. No CP.   Objective:  Vital Signs in the last 24 hours: Temp:  [97.5 F (36.4 C)-98.6 F (37 C)] 98.6 F (37 C) (09/21 0400) Pulse Rate:  [26-128] 29 (09/21 0800) Resp:  [15-27] 21 (09/21 0800) BP: (97-151)/(23-95) 137/71 mmHg (09/21 0800) SpO2:  [90 %-100 %] 99 % (09/21 0800) Weight:  [69.1 kg (152 lb 5.4 oz)] 69.1 kg (152 lb 5.4 oz) (09/21 0500)  Intake/Output from previous day: 09/20 0701 - 09/21 0700 In: 1945 [P.O.:1320; I.V.:475; IV Piggyback:150] Out: 2275 [Urine:1925; Stool:350]   Physical Exam: General: Well developed, coughing  Head: Normocephalic, atraumatic, sclera non-icteric, no xanthomas, nares are without discharge.  Neck: Supple.JVD not elevated at 30 degs.  Lungs: central rhoncherous breath sounds.  Heart: RRR with S1 S2. No murmurs, rubs, or gallops appreciated.  Abdomen: ostomy in place, wound c/d/i  Msk: Strength and tone appear normal for age.  Extremities: No clubbing or cyanosis. No edema. Distal pedal pulses are 2+, extremities coolish  Neuro: Alert and oriented X 3. Moves all extremities spontaneously.  Psych: Responds to questions appropriately with a normal affect.   Marland Kitchen aztreonam  2 g Intravenous Q8H  . bisoprolol  10 mg Oral Daily  . heparin subcutaneous  5,000 Units Subcutaneous Q8H  . influenza vac split quadrivalent PF  0.5 mL Intramuscular Tomorrow-1000  . linezolid  600 mg Oral Q12H  . pantoprazole  40 mg Oral Daily  . potassium chloride  20 mEq Oral Q4H  . sodium chloride  3 mL Intravenous Q12H    Lab Results:  Recent Labs  01/05/13 0340 01/06/13 0408  WBC 9.3 6.0  HGB 9.6* 9.4*  PLT 178 221    Recent Labs  01/05/13 0340 01/06/13 0408  NA 124* 125*  K 3.6 3.5  CL 93* 95*  CO2 17* 20  GLUCOSE 117* 99  BUN 25* 20  CREATININE 1.16* 0.94    Recent Labs  01/04/13 1455  TROPONINI <0.30   Hepatic Function Panel No results found for this basename: PROT, ALBUMIN, AST,  ALT, ALKPHOS, BILITOT, BILIDIR, IBILI,  in the last 72 hours No results found for this basename: CHOL,  in the last 72 hours No results found for this basename: PROTIME,  in the last 72 hours  Imaging: Dg Chest 2 View  01/04/2013   CLINICAL DATA:  Cough and congestion; fever  EXAM: CHEST  2 VIEW  COMPARISON:  December 22, 2012  FINDINGS: There is widespread reticulonodular interstitial disease throughout the mid and lower lung zones, essentially stable. A few areas of patchy airspace disease are seen in these areas as well. No new opacity is seen. Heart size and pulmonary vascularity are normal. No adenopathy. No bone lesions.  IMPRESSION: Extensive reticulonodular interstitial disease and patchy airspace disease in the mid and lower lung zones. Differential considerations include atypical infectious appearance, toxin/ medication reactive change, or atypical congestive heart failure. No appreciable change is noted compared to most recent prior study.   Electronically Signed   By: Bretta Bang   On: 01/04/2013 17:03   Dg Chest Port 1 View  01/06/2013   CLINICAL DATA:  Pulmonary edema  EXAM: PORTABLE CHEST - 1 VIEW  COMPARISON:  01/05/2013  FINDINGS: Diffuse interstitial/ airspace opacity bilaterally, reflecting mild to moderate interstitial edema, mildly increased. Possible trace left pleural effusion. No pneumothorax.  Cardiomegaly.  IMPRESSION: Cardiomegaly with mild to moderate interstitial edema, mildly increased.  Possible trace left pleural effusion.  Electronically Signed   By: Charline Bills M.D.   On: 01/06/2013 07:59   Dg Chest Port 1 View  01/05/2013   *RADIOLOGY REPORT*  Clinical Data: CHF, history of hypertension  PORTABLE CHEST - 1 VIEW  Comparison: Prior radiograph from 01/04/2013  Findings: Cardiomegaly is stable as compared to the prior examination.  The current examination has been performed with a similar degree of lung inflation.  There has been interval worsening of diffuse  pulmonary vascular congestion and peribronchial cuffing, suggestive of worsening essentially edema. Small bilateral pleural effusions are present.  No pneumothorax.  No infiltrates identified.  No acute osseous abnormality.  IMPRESSION: 1.  Interval worsening of pulmonary vascular congestion and for with diffuse interstitial prominence and peribronchial cuffing, suggestive of worsening atypical congestive heart failure. Possible atypical infection could also have this appearance. 2.  Small bilateral pleural effusions.   Original Report Authenticated By: Rise Mu, M.D.   Personally viewed.   Telemetry: AFIB/MAT - HR 110-130 Personally viewed. Was NSR yesterday am.  Cardiac Studies:  EF 65%  Assessment/Plan:  Principal Problem:   Severe sepsis Active Problems:   Atrial fibrillation with rapid ventricular response   CMML (chronic myelomonocytic leukemia)   Acute respiratory failure   1) MAT/AFIB  - increased beta blocker yesterday from 5 to 10mg . Agree. If BP can tolerate and if HR >120 on avg, consider increase to 15mg  QD.  - Clearly see P waves at times on tele. With underlying lung condition, MAT likely.   - No anticoagulation due to risk of bleeding.   2) Diastolic HF  - mixed picture  - was on 35ml/hr  - KVO now at 10  -Hyponatremia - stable.   Daejah Klebba 01/06/2013, 8:05 AM

## 2013-01-06 NOTE — Progress Notes (Addendum)
PULMONARY  / CRITICAL CARE MEDICINE  Name: [name] MRN: [id number] DOB: Sep 02, 1950    ADMISSION DATE:  01/04/2013 CONSULTATION DATE:  01/04/2013  REFERRING MD :  Dr. Jodi Mourning PRIMARY SERVICE: CCM  CHIEF COMPLAINT:  SOB  BRIEF PATIENT DESCRIPTION: 62 F with CMML, Aflutter, colostomy presenting with SOB in setting of new productive cough following recent hospitalization.  SIGNIFICANT EVENTS / STUDIES:   LINES / TUBES: 1. PIV  CULTURES: 9/19 UC>> 9/19 BCx2>> 9/19 resp cult >>  ANTIBIOTICS: 1. Linezolid 9/19-change to PO 9/20>> 2. Aztreonam 9/19-  Subjective: Pt feels better this am.  Did not sleep   PHYSICAL EXAM  VITAL SIGNS: Temp:  [97.5 F (36.4 C)-98.6 F (37 C)] 98.6 F (37 C) (09/21 0400) Pulse Rate:  [26-128] 29 (09/21 0800) Resp:  [15-27] 21 (09/21 0800) BP: (97-151)/(23-95) 137/71 mmHg (09/21 0800) SpO2:  [90 %-100 %] 99 % (09/21 0800) Weight:  [69.1 kg (152 lb 5.4 oz)] 69.1 kg (152 lb 5.4 oz) (09/21 0500)  HEMODYNAMICS: BP better     ON RA     INTAKE / OUTPUT: Intake/Output     09/20 0701 - 09/21 0700 09/21 0701 - 09/22 0700   P.O. 1320    I.V. (mL/kg) 475 (6.9)    IV Piggyback 150    Total Intake(mL/kg) 1945 (28.1)    Urine (mL/kg/hr) 1925 (1.2) 225 (2)   Stool 350 (0.2)    Total Output 2275 225   Net -330 -225          PHYSICAL EXAMINATION: General:  Elderly F in NAD Neuro:  Intact HEENT:  MM moist Sclera anicteric.  Neck:  Trachea supple and midline. (-) LAN. Mild bilateral shotty LAN on the left cervical chain Cardiovascular:  RRR, NS1/S2, (-) MRG2 Lungs:  Decreased BS, exp wheezes, scattered rales/rhonchi Abdomen:  S/NT/ND/(+)BS Musculoskeletal:  (-) C/C/E Skin:  (-) Rashes  LABS:  CBC Recent Labs     01/04/13  1455  01/05/13  0340  01/06/13  0408  WBC  13.2*  9.3  6.0  HGB  10.5*  9.6*  9.4*  HCT  30.4*  28.4*  28.1*  PLT  278  178  221    Coag's No results found for this basename: APTT, INR,  in the last 72  hours  BMET Recent Labs     01/04/13  1455  01/05/13  0340  01/06/13  0408  NA  125*  124*  125*  K  3.8  3.6  3.5  CL  91*  93*  95*  CO2  17*  17*  20  BUN  23  25*  20  CREATININE  1.14*  1.16*  0.94  GLUCOSE  90  117*  99    Electrolytes Recent Labs     01/04/13  1455  01/05/13  0340  01/06/13  0408  CALCIUM  7.3*  6.7*  6.7*    Sepsis Markers No results found for this basename: LACTICACIDVEN, PROCALCITON, O2SATVEN,  in the last 72 hours  ABG No results found for this basename: PHART, PCO2ART, PO2ART,  in the last 72 hours  Liver Enzymes No results found for this basename: AST, ALT, ALKPHOS, BILITOT, ALBUMIN,  in the last 72 hours  Cardiac Enzymes Recent Labs     01/04/13  1455  01/05/13  0345  TROPONINI  <0.30   --   PROBNP  7057.0*  5651.0*    Glucose No results found for this basename: GLUCAP,  in the  last 72 hours  Imaging Dg Chest 2 View  01/04/2013   CLINICAL DATA:  Cough and congestion; fever  EXAM: CHEST  2 VIEW  COMPARISON:  December 22, 2012  FINDINGS: There is widespread reticulonodular interstitial disease throughout the mid and lower lung zones, essentially stable. A few areas of patchy airspace disease are seen in these areas as well. No new opacity is seen. Heart size and pulmonary vascularity are normal. No adenopathy. No bone lesions.  IMPRESSION: Extensive reticulonodular interstitial disease and patchy airspace disease in the mid and lower lung zones. Differential considerations include atypical infectious appearance, toxin/ medication reactive change, or atypical congestive heart failure. No appreciable change is noted compared to most recent prior study.   Electronically Signed   By: Bretta Bang   On: 01/04/2013 17:03   Dg Chest Port 1 View  01/06/2013   CLINICAL DATA:  Pulmonary edema  EXAM: PORTABLE CHEST - 1 VIEW  COMPARISON:  01/05/2013  FINDINGS: Diffuse interstitial/ airspace opacity bilaterally, reflecting mild to moderate  interstitial edema, mildly increased. Possible trace left pleural effusion. No pneumothorax.  Cardiomegaly.  IMPRESSION: Cardiomegaly with mild to moderate interstitial edema, mildly increased.  Possible trace left pleural effusion.   Electronically Signed   By: Charline Bills M.D.   On: 01/06/2013 07:59   Dg Chest Port 1 View  01/05/2013   *RADIOLOGY REPORT*  Clinical Data: CHF, history of hypertension  PORTABLE CHEST - 1 VIEW  Comparison: Prior radiograph from 01/04/2013  Findings: Cardiomegaly is stable as compared to the prior examination.  The current examination has been performed with a similar degree of lung inflation.  There has been interval worsening of diffuse pulmonary vascular congestion and peribronchial cuffing, suggestive of worsening essentially edema. Small bilateral pleural effusions are present.  No pneumothorax.  No infiltrates identified.  No acute osseous abnormality.  IMPRESSION: 1.  Interval worsening of pulmonary vascular congestion and for with diffuse interstitial prominence and peribronchial cuffing, suggestive of worsening atypical congestive heart failure. Possible atypical infection could also have this appearance. 2.  Small bilateral pleural effusions.   Original Report Authenticated By: Rise Mu, M.D.    ASSESSMENT / PLAN: Principal Problem:   Severe sepsis Active Problems:   Atrial fibrillation with rapid ventricular response   CMML (chronic myelomonocytic leukemia)   Acute respiratory failure   PULMONARY A: Acute respiratory failure: Due to HCAP and mild pulm edema P:    HCAP therapy to cont with aztreonam, Linezolid(change to PO)  F/u Sputum cultures  Add spiriva and xopenex, hx of smoking and wheezing. Want to avoid beta agonists with HR   CARDIOVASCULAR A:  1. Afib with RVR: Going in and out of this. Cannot anticoagulate d/t to bleeding risk.   2. Severe sepsis: Meets SIRS criteria in setting of sepsis with hypotension. Likely 2/2  pneumonia.  This is better   P:   Increase BB to 15mg  bisoprolol per day  IVF@KVO   Serial trops>>>NORMAL  No lasix   RENAL A:  1. CKD: Cr at baseline  P:    Monitor  GASTROINTESTINAL A: 1. No Acute Issue 2. Ostomy/Abdominal Wound:   P:    Continue wound care daily, discussed with gen surg who is aware of this procedure  HEMATOLOGIC A:   Chronic anemia CMML:Pt followed previously by Dr Gaylyn Rong , last seen 01/2012. See below last note: chronic myelomonocytic leukemia (low risk: With solitary anemia, normal cytogenetics and normal blast count). She is on observation for this.  Vitamin B12  deficiency: Vitamin B12 injection given regularly.    INFECTIOUS A: 1. HCAP: NOS  P:    Sputum culture, antibiotics, blood culture as above  ENDOCRINE A:    No acute issue  NEUROLOGIC A:  No acute issue  TODAY'S SUMMARY:  62 y.o.F with acute diastolic CHF, HCAP, SIRS, Sepsis, volume sensitive. All improved 9/21. Plan :  ivf kvo,increase BB, hold lasix,cont  zyvox  po, cont iv azactam, mobilize. Tfr to SDU level of care   I have personally obtained a history, examined the patient, evaluated laboratory and imaging results, formulated the assessment and plan and placed orders.  CRITICAL CARE: The patient is critically ill with multiple organ systems failure and requires high complexity decision making for assessment and support, frequent evaluation and titration of therapies, application of advanced monitoring technologies and extensive interpretation of multiple databases. Critical Care Time devoted to patient care services described in this note is 30 minutes.   Dorcas Carrow Beeper  [phone]  Cell  [phone]  If no response or cell goes to voicemail, call beeper [phone]  01/06/2013, 8:36 AM

## 2013-01-06 NOTE — Progress Notes (Signed)
Pt's abdominal dressing changed with mod amount of greenish purulent drainage on old dressing; small amount of bright bloody oozing from inferior aspect of wound that gradually increased to large amount when pt got OOB to sit on BSC. Dressing reinforced and bleeding subsided.  Simranjit Thayer, Georga Hacking, RN

## 2013-01-06 NOTE — Progress Notes (Signed)
[facility] ADULT ICU REPLACEMENT PROTOCOL FOR AM LAB REPLACEMENT ONLY  The patient does apply for the [facility] Adult ICU Electrolyte Replacment Protocol based on the criteria listed below:   1. Is GFR >/= 40 ml/min? yes  Patient's GFR today is >63 2. Is urine output >/= 0.5 ml/kg/hr for the last 6 hours? yes Patient's UOP is 1.2 ml/kg/hr 3. Is BUN < 60 mg/dL? yes  Patient's BUN today is 20 4. Abnormal electrolyte(s):K+ 3.2 5. Ordered repletion with: see order 6. If a panic level lab has been reported, has the CCM MD in charge been notified? yes.   Physician:  Dr. Higinio Plan, Quita Mcgrory A 01/06/2013 6:03 AM

## 2013-01-07 ENCOUNTER — Inpatient Hospital Stay (HOSPITAL_COMMUNITY): Payer: BC Managed Care – PPO

## 2013-01-07 DIAGNOSIS — C9210 Chronic myeloid leukemia, BCR/ABL-positive, not having achieved remission: Secondary | ICD-10-CM

## 2013-01-07 LAB — BASIC METABOLIC PANEL
CO2: 17 mEq/L — ABNORMAL LOW (ref 19–32)
Chloride: 97 mEq/L (ref 96–112)
GFR calc Af Amer: >90 mL/min (ref >90)
Glucose, Bld: 106 mg/dL — ABNORMAL HIGH (ref 70–99)
Potassium: 4.2 mEq/L (ref 3.5–5.1)
Sodium: 126 mEq/L — ABNORMAL LOW (ref 135–145)

## 2013-01-07 LAB — CBC
Hemoglobin: 9.8 g/dL — ABNORMAL LOW (ref 12.0–15.0)
MCH: 33.9 pg (ref 26.0–34.0)
MCV: 97.2 fL (ref 78.0–100.0)
RBC: 2.89 MIL/uL — ABNORMAL LOW (ref 3.87–5.11)
WBC: 4.7 10*3/uL (ref 4.0–10.5)

## 2013-01-07 MED ORDER — MUPIROCIN 2 % EX OINT
1.0000 "application " | TOPICAL_OINTMENT | Freq: Two times a day (BID) | CUTANEOUS | Status: DC
  Administered 2013-01-07 – 2013-01-11 (×9): 1 via NASAL
  Filled 2013-01-07: qty 22

## 2013-01-07 MED ORDER — CHLORHEXIDINE GLUCONATE CLOTH 2 % EX PADS
6.0000 | MEDICATED_PAD | Freq: Every day | CUTANEOUS | Status: DC
  Administered 2013-01-07 – 2013-01-09 (×3): 6 via TOPICAL

## 2013-01-07 NOTE — Progress Notes (Signed)
ANTIBIOTIC CONSULT NOTE - FOLLOW UP  Pharmacy Consult for Aztreonam, antibiotic renal dose adjustment (Zyvox) Indication: HCAP  Allergies  Allergen Reactions  . Vancomycin Hives and Rash    ? wheezing  . Ativan [Lorazepam]     confusion  . Codeine Nausea And Vomiting  . Tetanus Toxoids Other (See Comments)    serum  . Penicillins Hives and Rash  . Xarelto [Rivaroxaban] Hives and Rash    ?    Patient Measurements: Height: 5\' 5"  (165.1 cm) Weight: 144 lb 6.4 oz (65.5 kg) IBW/kg (Calculated) : 57   Vital Signs: Temp: 98.5 F (36.9 C) (09/22 1304) Temp src: Oral (09/22 1304) BP: 129/71 mmHg (09/22 1304) Pulse Rate: 79 (09/22 1304) Intake/Output from previous day: 09/21 0701 - 09/22 0700 In: 1440 [P.O.:1080; I.V.:210; IV Piggyback:150] Out: 2200 [Urine:2075; Stool:125] Intake/Output from this shift: Total I/O In: 110 [I.V.:60; IV Piggyback:50] Out: 350 [Urine:350]  Labs:  Recent Labs  01/05/13 0340 01/06/13 0408 01/07/13 0345  WBC 9.3 6.0 4.7  HGB 9.6* 9.4* 9.8*  PLT 178 221 256  CREATININE 1.16* 0.94 0.79   Estimated Creatinine Clearance: 64.8 ml/min (by C-G formula based on Cr of 0.79). No results found for this basename: VANCOTROUGH, Leodis Binet, VANCORANDOM, GENTTROUGH, GENTPEAK, GENTRANDOM, TOBRATROUGH, TOBRAPEAK, TOBRARND, AMIKACINPEAK, AMIKACINTROU, AMIKACIN,  in the last 72 hours   Microbiology: Recent Results (from the past 720 hour(s))  MRSA PCR SCREENING     Status: Abnormal   Collection Time    12/20/12  7:23 PM      Result Value Range Status   MRSA by PCR POSITIVE (*) NEGATIVE Final   Comment:            The GeneXpert MRSA Assay (FDA     approved for NASAL specimens     only), is one component of a     comprehensive MRSA colonization     surveillance program. It is not     intended to diagnose MRSA     infection nor to guide or     monitor treatment for     MRSA infections.     RESULT CALLED TO, READ BACK BY AND VERIFIED WITH:   LEWIS,S/4E @2217  ON 12/20/12 BY KARCZEWSKI,S.  CULTURE, BLOOD (ROUTINE X 2)     Status: None   Collection Time    01/04/13  4:30 PM      Result Value Range Status   Specimen Description BLOOD RIGHT ARM   Final   Special Requests BOTTLES DRAWN AEROBIC AND ANAEROBIC 5CC   Final   Culture  Setup Time     Final   Value: 01/04/2013 20:24     Performed at [facility]   Culture     Final   Value:        BLOOD CULTURE RECEIVED NO GROWTH TO DATE CULTURE WILL BE HELD FOR 5 DAYS BEFORE ISSUING A FINAL NEGATIVE REPORT     Performed at [facility]   Report Status PENDING   Incomplete  URINE CULTURE     Status: None   Collection Time    01/05/13  3:06 AM      Result Value Range Status   Specimen Description URINE, CATHETERIZED   Final   Special Requests Normal   Final   Culture  Setup Time     Final   Value: 01/05/2013 17:40     Performed at [facility] Count     Final   Value: NO  GROWTH     Performed at [facility]   Culture     Final   Value: NO GROWTH     Performed at [facility]   Report Status 01/06/2013 FINAL   Final  CULTURE, EXPECTORATED SPUTUM-ASSESSMENT     Status: None   Collection Time    01/06/13  6:37 AM      Result Value Range Status   Specimen Description SPU   Final   Special Requests Normal   Final   Sputum evaluation     Final   Value: MICROSCOPIC FINDINGS SUGGEST THAT THIS SPECIMEN IS NOT REPRESENTATIVE OF LOWER RESPIRATORY SECRETIONS. PLEASE RECOLLECT.     S RUSSELL AT 0718 ON 09.21.2014 BY NBROOKS   Report Status 01/06/2013 FINAL   Final  CULTURE, EXPECTORATED SPUTUM-ASSESSMENT     Status: None   Collection Time    01/06/13  8:25 AM      Result Value Range Status   Specimen Description SPUTUM   Final   Special Requests Normal   Final   Sputum evaluation     Final   Value: THIS SPECIMEN IS ACCEPTABLE. RESPIRATORY CULTURE REPORT TO FOLLOW.   Report Status 01/06/2013 FINAL   Final  CULTURE, RESPIRATORY  (NON-EXPECTORATED)     Status: None   Collection Time    01/06/13  8:25 AM      Result Value Range Status   Specimen Description SPUTUM   Final   Special Requests NONE   Final   Gram Stain     Final   Value: RARE WBC PRESENT, PREDOMINANTLY PMN     RARE SQUAMOUS EPITHELIAL CELLS PRESENT     NO ORGANISMS SEEN     Performed at [facility]   Culture     Final   Value: NO GROWTH     Performed at [facility]   Report Status PENDING   Incomplete    Assessment: 50 yoF c/o SOB and fatigue x 2 weeks following hospital discharge 2 weeks ago for which patient was admitted for acute respiratory failue 2/2 CHF exacerbation. Patient has allergies to penicillin and vancomycin, therefore started aztreonam and zyvox given recent hospitalization and concern for HCAP.    Day #4 Aztreonam 2g IV q8h and Zyvox 600mg  PO q12h  Tm 100.3 this am  Cultures unrevealing  WBC improved to wnl  SCr improved, CrCl 65 ml/min  Goal of Therapy:  Eradication of infection, doses adjusted per renal clearance  Plan:   Continue Aztreonam 2g IV q8h  Continue Zyvox 600mg  PO q12h  F/u plan for abx following CXR results.  Loralee Pacas, PharmD, BCPS Pager: [phone] 01/07/2013,1:07 PM

## 2013-01-07 NOTE — Progress Notes (Signed)
PULMONARY  / CRITICAL CARE MEDICINE  Name: [name] MRN: [id number] DOB: 03/27/1951    ADMISSION DATE:  01/04/2013 CONSULTATION DATE:  01/04/2013  REFERRING MD :  Dr. Jodi Mourning PRIMARY SERVICE: CCM  CHIEF COMPLAINT:  SOB  BRIEF PATIENT DESCRIPTION: 45 F with CMML, A flutter, colostomy presenting with SOB in setting of new productive cough following recent hospitalization.  SIGNIFICANT EVENTS / STUDIES:  9/20 Dopper legs >> no DVT  LINES / TUBES: PIV  CULTURES: 9/19 Blood >> 9/20 UC>>negative 9/21 resp cult >>  ANTIBIOTICS: Linezolid 9/19-change to PO 9/20>> Aztreonam 9/19-  Subjective: Feels better.  Not as much cough.  Has chronic neck pain.  Denies chest/abd pain.  PHYSICAL EXAM VITAL SIGNS: Temp:  [97.5 F (36.4 C)-98.3 F (36.8 C)] 98.2 F (36.8 C) (09/22 0847) Pulse Rate:  [29-96] 88 (09/22 0847) Resp:  [14-23] 19 (09/22 0847) BP: (114-153)/(58-81) 147/75 mmHg (09/22 0847) SpO2:  [94 %-100 %] 99 % (09/22 0847) Weight:  [144 lb 6.4 oz (65.5 kg)] 144 lb 6.4 oz (65.5 kg) (09/22 0400) Room air INTAKE / OUTPUT: Intake/Output     09/21 0701 - 09/22 0700 09/22 0701 - 09/23 0700   P.O. 1080    I.V. (mL/kg) 210 (3.2) 10 (0.2)   IV Piggyback 150 50   Total Intake(mL/kg) 1440 (22) 60 (0.9)   Urine (mL/kg/hr) 2075 (1.3) 350 (2.2)   Stool 125 (0.1)    Total Output 2200 350   Net -760 -290        Urine Occurrence 1 x    Stool Occurrence 2 x 1 x     PHYSICAL EXAMINATION: General: no distress Neuro:  Alert, normal strength HEENT: no sinus tenderness Cardiovascular: regular Lungs:  Decrease breath sounds, no wheeze Abdomen: soft, wound dressing clean, colostomy in place Musculoskeletal: no edema Skin: no rashes  LABS:  CBC Recent Labs     01/05/13  0340  01/06/13  0408  01/07/13  0345  WBC  9.3  6.0  4.7  HGB  9.6*  9.4*  9.8*  HCT  28.4*  28.1*  28.1*  PLT  178  221  256    Coag's No results found for this basename: APTT, INR,  in the last 72  hours  BMET Recent Labs     01/05/13  0340  01/06/13  0408  01/07/13  0345  NA  124*  125*  126*  K  3.6  3.5  4.2  CL  93*  95*  97  CO2  17*  20  17*  BUN  25*  20  18  CREATININE  1.16*  0.94  0.79  GLUCOSE  117*  99  106*    Electrolytes Recent Labs     01/05/13  0340  01/06/13  0408  01/07/13  0345  CALCIUM  6.7*  6.7*  6.7*    Sepsis Markers No results found for this basename: LACTICACIDVEN, PROCALCITON, O2SATVEN,  in the last 72 hours  ABG No results found for this basename: PHART, PCO2ART, PO2ART,  in the last 72 hours  Liver Enzymes No results found for this basename: AST, ALT, ALKPHOS, BILITOT, ALBUMIN,  in the last 72 hours  Cardiac Enzymes Recent Labs     01/04/13  1455  01/05/13  0345  TROPONINI  <0.30   --   PROBNP  7057.0*  5651.0*    Glucose No results found for this basename: GLUCAP,  in the last 72 hours  Imaging Dg Chest [facility]  1 View  01/07/2013   CLINICAL DATA:  Pneumonia, edema.  EXAM: PORTABLE CHEST - 1 VIEW  COMPARISON:  01/06/2013.  FINDINGS: Diffuse interstitial opacities are decreased. There is still diffuse reticular nodular densities. Small bilateral pleural effusions. Stable heart size. No pneumothorax.  IMPRESSION: 1. Pulmonary edema is improved since 1 day prior. 2. Persistent lung opacity may represent residual edema or pneumonia. 3. Small bilateral pleural effusions.   Electronically Signed   By: Tiburcio Pea   On: 01/07/2013 05:37   Dg Chest Port 1 View  01/06/2013   CLINICAL DATA:  Pulmonary edema  EXAM: PORTABLE CHEST - 1 VIEW  COMPARISON:  01/05/2013  FINDINGS: Diffuse interstitial/ airspace opacity bilaterally, reflecting mild to moderate interstitial edema, mildly increased. Possible trace left pleural effusion. No pneumothorax.  Cardiomegaly.  IMPRESSION: Cardiomegaly with mild to moderate interstitial edema, mildly increased.  Possible trace left pleural effusion.   Electronically Signed   By: Charline Bills M.D.   On:  01/06/2013 07:59    ASSESSMENT / PLAN:   PULMONARY A: Acute respiratory failure: Due to HCAP and mild pulm edema >> much improved Hx of smoking with wheezing ? COPD >> improved with addition of BD's P:   Continue spiriva, xopenex Needs to stop smoking Will need further outpt assessment with PFT's F/u CXR as needed Oxygen to keep SpO2 > 92%  CARDIOVASCULAR A:  Afib with RVR >> not candidate for anticoagulation due to risk of bleeding. Severe sepsis w/o septic shock >> resolved. P:  Even fluid balance Continue ASA, bisoprolol per cardiology  RENAL A:  Stage II CKD Chronic hyponatremia P:   F/u BMET  GASTROINTESTINAL A: Ostomy/Abdominal Wound P:   Followed by [facility]  HEMATOLOGIC A:   Chronic anemia CMML P: F/u CBC  INFECTIOUS A: HCAP P:   Day 4 of Abx  NEUROLOGIC A:  Chronic neck pain P: PT PRN fentanyl, hycet  Transfer to telemetry  Transfer to [facility] 9/23 and PCCM sign off.  Coralyn Helling, MD [facility] Pulmonary/Critical Care 01/07/2013, 9:24 AM Pager:  [phone] After 3pm call: [phone]

## 2013-01-07 NOTE — Progress Notes (Signed)
    Subjective:  No chest pain or palpitations. Feeling better after getting some sleep last night.  Objective:  Vital Signs in the last 24 hours: Temp:  [97.5 F (36.4 C)-98.3 F (36.8 C)] 98.1 F (36.7 C) (09/22 0400) Pulse Rate:  [29-96] 83 (09/22 0600) Resp:  [14-23] 14 (09/22 0600) BP: (114-153)/(58-81) 134/70 mmHg (09/22 0600) SpO2:  [94 %-100 %] 98 % (09/22 0600) Weight:  [144 lb 6.4 oz (65.5 kg)] 144 lb 6.4 oz (65.5 kg) (09/22 0400)  Intake/Output from previous day: 09/21 0701 - 09/22 0700 In: 1430 [P.O.:1080; I.V.:200; IV Piggyback:150] Out: 2200 [Urine:2075; Stool:125]  Physical Exam: Pt is alert and oriented, NAD HEENT: normal Neck: JVP - normal Lungs: CTA bilaterally (anteriorly) CV: RRR without murmur or gallop Abd: soft, NT Ext: no C/C/E, distal pulses intact and equal Skin: warm/dry no rash   Lab Results:  Recent Labs  01/06/13 0408 01/07/13 0345  WBC 6.0 4.7  HGB 9.4* 9.8*  PLT 221 256    Recent Labs  01/06/13 0408 01/07/13 0345  NA 125* 126*  K 3.5 4.2  CL 95* 97  CO2 20 17*  GLUCOSE 99 106*  BUN 20 18  CREATININE 0.94 0.79    Recent Labs  01/04/13 1455  TROPONINI <0.30    Cardiac Studies: 2D Echo: Study Conclusions  - Left ventricle: The cavity size was normal. Systolic function was normal. The estimated ejection fraction was in the range of 60% to 65%. Wall motion was normal; there were no regional wall motion abnormalities. Doppler parameters are consistent with abnormal left ventricular relaxation (grade 1 diastolic dysfunction). Doppler parameters are consistent with high ventricular filling pressure. - Aortic root: The aortic root was mildly dilated. - Mitral valve: Calcified annulus.  Tele: Sinus rhythm, heart rate 85-90 bpm  Assessment/Plan:  Atrial fib with RVR, now in sinus rhythm. Placed back on bisoprolol at dose of 15 mg daily (home dose was 5 mg). Seems to be doing well - would continue same meds. No  anticoagulation because of prohibitive bleeding risk. Will follow with you. thx  Tonny Bollman, M.D. 01/07/2013, 8:13 AM

## 2013-01-07 NOTE — Progress Notes (Signed)
OT Cancellation Note  Patient Details Name: [name] MRN: [id number] DOB: 06/13/1950   Cancelled Treatment:    Reason Eval/Treat Not Completed: Other (comment);Pain limiting ability to participate. Pt states she just returned to bed and had some pain medicine for neck pain and requests to rest. Will try back later time.  Lennox Laity [phone] 01/07/2013, 12:10 PM

## 2013-01-07 NOTE — Progress Notes (Signed)
PT Cancellation Note  Patient Details Name: [name] MRN: [id number] DOB: [birth year]   Cancelled Treatment:    Reason Eval/Treat Not Completed: Pain limiting ability to participate (pt declined. stated already up when checked on at 930)Unable to return to see pt today.   Rada Hay 01/07/2013, 4:35 PM Blanchard Kelch PT [phone]

## 2013-01-08 ENCOUNTER — Other Ambulatory Visit: Payer: BC Managed Care – PPO | Admitting: Lab

## 2013-01-08 ENCOUNTER — Ambulatory Visit: Payer: BC Managed Care – PPO | Admitting: Hematology and Oncology

## 2013-01-08 DIAGNOSIS — I119 Hypertensive heart disease without heart failure: Secondary | ICD-10-CM

## 2013-01-08 DIAGNOSIS — D649 Anemia, unspecified: Secondary | ICD-10-CM

## 2013-01-08 DIAGNOSIS — E871 Hypo-osmolality and hyponatremia: Secondary | ICD-10-CM

## 2013-01-08 LAB — BASIC METABOLIC PANEL
BUN: 14 mg/dL (ref 6–23)
CO2: 20 mEq/L (ref 19–32)
Calcium: 7.1 mg/dL — ABNORMAL LOW (ref 8.4–10.5)
Chloride: 95 mEq/L — ABNORMAL LOW (ref 96–112)
Creatinine, Ser: 0.79 mg/dL (ref 0.50–1.10)
GFR calc Af Amer: >90 mL/min (ref >90)
GFR calc non Af Amer: 87 mL/min — ABNORMAL LOW (ref >90)
Glucose, Bld: 93 mg/dL (ref 70–99)
Potassium: 4.4 mEq/L (ref 3.5–5.1)

## 2013-01-08 LAB — CULTURE, RESPIRATORY W GRAM STAIN

## 2013-01-08 LAB — CULTURE, RESPIRATORY

## 2013-01-08 LAB — CBC
HCT: 27.5 % — ABNORMAL LOW (ref 36.0–46.0)
Hemoglobin: 9.4 g/dL — ABNORMAL LOW (ref 12.0–15.0)
MCHC: 34.2 g/dL (ref 30.0–36.0)
MCV: 98.2 fL (ref 78.0–100.0)
RDW: 16.4 % — ABNORMAL HIGH (ref 11.5–15.5)
WBC: 3.7 10*3/uL — ABNORMAL LOW (ref 4.0–10.5)

## 2013-01-08 MED ORDER — BOOST / RESOURCE BREEZE PO LIQD: 1.0000 | Freq: Every day | ORAL | Status: DC | PRN

## 2013-01-08 MED ORDER — FLUCONAZOLE IN SODIUM CHLORIDE 200-0.9 MG/100ML-% IV SOLN
200.0000 mg | Freq: Every day | INTRAVENOUS | Status: DC
  Administered 2013-01-09 (×2): 200 mg via INTRAVENOUS
  Filled 2013-01-08 (×2): qty 100

## 2013-01-08 MED ORDER — HYDROCODONE-ACETAMINOPHEN 5-325 MG PO TABS
1.0000 | ORAL_TABLET | ORAL | Status: DC | PRN
  Administered 2013-01-09: 1 via ORAL
  Administered 2013-01-09: 2 via ORAL
  Administered 2013-01-09 (×2): 1 via ORAL
  Administered 2013-01-09: 2 via ORAL
  Administered 2013-01-10 (×3): 1 via ORAL
  Administered 2013-01-10 – 2013-01-11 (×2): 2 via ORAL
  Administered 2013-01-11 (×3): 1 via ORAL
  Filled 2013-01-08 (×2): qty 1
  Filled 2013-01-08 (×2): qty 2
  Filled 2013-01-08 (×4): qty 1
  Filled 2013-01-08: qty 2
  Filled 2013-01-08: qty 1
  Filled 2013-01-08: qty 2
  Filled 2013-01-08 (×2): qty 1

## 2013-01-08 NOTE — Progress Notes (Signed)
PT Cancellation Note  Patient Details Name: [name] MRN: [id number] DOB: February 05, 1951   Cancelled Treatment:    Reason Eval/Treat Not Completed: PT screened, no needs identified, will sign off   Rada Hay 01/08/2013, 1:56 PM Blanchard Kelch PT [phone]

## 2013-01-08 NOTE — Progress Notes (Signed)
[facility] PROGRESS NOTE  [name] VWU:[id number] DOB: 07/31/1950 DOA: 01/04/2013 PCP: Cassell Clement, MD  Assessment/Plan: Atrial fibrillation with RVR -rate controlled on bisoprolol 15mg  daily -continue ASA -not candidate for anticoagulation due to risk of bleeding -cards s/o today, She has appt with Dr Patty Sermons 11/4 Hyponatremia -she states this is a chronic problem for her( although her NA was nl 136 in 06/2012) -her BNP was elevated this hospital stay and had mild pulm edema, will fluid restrict at this time -check urine lytes, follow and further treat accordingly Acute respiratory failure: Due to HCAP and mild pulm edema  Clinically improved, Continue spiriva, xopenex, on day 5 of abx  Per Pulm, Will need further outpt assessment with PFT's  F/u CXR in am  Oxygen to keep SpO2 > 92% Severe sepsis w/o septic shock >> resolved. -cultures so far neg, on day 5 of abx as above-follow Ostomy/Abdominal Wound She is followed by [facility] Chronic anemia /CMML(chronic myelomonocytic leukemia) -hgb stable, follow up outpt Chronic neck pain -continue vicodin for pain control -follow up with Dr Danielle Dess outpt  Code Status: full Family Communication: none at bedside  Disposition Plan: to home when medically stable   Consultants:  Was on CCM service and transferred to [facility] beginning 9/23  Cards s/o 9/23  Procedures:  none  Antibiotics: 1. Linezolid 9/19- 2. Aztreonam 9/19-     HPI/Subjective: Pt states she feels weak, denies CP, states breathing better.  Objective: Filed Vitals:   01/08/13 0516  BP: 128/63  Pulse: 97  Temp: 97.1 F (36.2 C)  Resp: 18    Intake/Output Summary (Last 24 hours) at 01/08/13 1051 Last data filed at 01/08/13 0947  Gross per 24 hour  Intake    451 ml  Output      0 ml  Net    451 ml   Filed Weights   01/06/13 0500 01/07/13 0400 01/08/13 0516  Weight: 69.1 kg (152 lb 5.4 oz) 65.5 kg (144 lb 6.4 oz) 66.86 kg (147 lb 6.4 oz)     Exam:  General: alert & oriented x 3 In NAD Cardiovascular: RRR, nl S1 s2 Respiratory: decreased BS at bases, few rhonchi Abdomen: soft +BS NT/ND, no masses palpable Extremities: No cyanosis and no edema    Data Reviewed: Basic Metabolic Panel:  Recent Labs Lab 01/04/13 1455 01/05/13 0340 01/06/13 0408 01/07/13 0345 01/08/13 0410  NA 125* 124* 125* 126* 126*  K 3.8 3.6 3.5 4.2 4.4  CL 91* 93* 95* 97 95*  CO2 17* 17* 20 17* 20  GLUCOSE 90 117* 99 106* 93  BUN 23 25* 20 18 14   CREATININE 1.14* 1.16* 0.94 0.79 0.79  CALCIUM 7.3* 6.7* 6.7* 6.7* 7.1*   Liver Function Tests: No results found for this basename: AST, ALT, ALKPHOS, BILITOT, PROT, ALBUMIN,  in the last 168 hours No results found for this basename: LIPASE, AMYLASE,  in the last 168 hours No results found for this basename: AMMONIA,  in the last 168 hours CBC:  Recent Labs Lab 01/04/13 1455 01/05/13 0340 01/06/13 0408 01/07/13 0345 01/08/13 0410  WBC 13.2* 9.3 6.0 4.7 3.7*  HGB 10.5* 9.6* 9.4* 9.8* 9.4*  HCT 30.4* 28.4* 28.1* 28.1* 27.5*  MCV 94.1 92.2 92.4 97.2 98.2  PLT 278 178 221 256 330   Cardiac Enzymes:  Recent Labs Lab 01/04/13 1455  TROPONINI <0.30   BNP (last 3 results)  Recent Labs  12/20/12 1522 01/04/13 1455 01/05/13 0345  PROBNP 2647.0* 7057.0* 5651.0*  CBG: No results found for this basename: GLUCAP,  in the last 168 hours  Recent Results (from the past 240 hour(s))  CULTURE, BLOOD (ROUTINE X 2)     Status: None   Collection Time    01/04/13  4:30 PM      Result Value Range Status   Specimen Description BLOOD RIGHT ARM   Final   Special Requests BOTTLES DRAWN AEROBIC AND ANAEROBIC 5CC   Final   Culture  Setup Time     Final   Value: 01/04/2013 20:24     Performed at [facility]   Culture     Final   Value:        BLOOD CULTURE RECEIVED NO GROWTH TO DATE CULTURE WILL BE HELD FOR 5 DAYS BEFORE ISSUING A FINAL NEGATIVE REPORT     Performed at [facility]   Report Status PENDING   Incomplete  URINE CULTURE     Status: None   Collection Time    01/05/13  3:06 AM      Result Value Range Status   Specimen Description URINE, CATHETERIZED   Final   Special Requests Normal   Final   Culture  Setup Time     Final   Value: 01/05/2013 17:40     Performed at [facility] Count     Final   Value: NO GROWTH     Performed at [facility]   Culture     Final   Value: NO GROWTH     Performed at [facility]   Report Status 01/06/2013 FINAL   Final  CULTURE, EXPECTORATED SPUTUM-ASSESSMENT     Status: None   Collection Time    01/06/13  6:37 AM      Result Value Range Status   Specimen Description SPU   Final   Special Requests Normal   Final   Sputum evaluation     Final   Value: MICROSCOPIC FINDINGS SUGGEST THAT THIS SPECIMEN IS NOT REPRESENTATIVE OF LOWER RESPIRATORY SECRETIONS. PLEASE RECOLLECT.     S RUSSELL AT 0718 ON 09.21.2014 BY NBROOKS   Report Status 01/06/2013 FINAL   Final  CULTURE, EXPECTORATED SPUTUM-ASSESSMENT     Status: None   Collection Time    01/06/13  8:25 AM      Result Value Range Status   Specimen Description SPUTUM   Final   Special Requests Normal   Final   Sputum evaluation     Final   Value: THIS SPECIMEN IS ACCEPTABLE. RESPIRATORY CULTURE REPORT TO FOLLOW.   Report Status 01/06/2013 FINAL   Final  CULTURE, RESPIRATORY (NON-EXPECTORATED)     Status: None   Collection Time    01/06/13  8:25 AM      Result Value Range Status   Specimen Description SPUTUM   Final   Special Requests NONE   Final   Gram Stain     Final   Value: RARE WBC PRESENT, PREDOMINANTLY PMN     RARE SQUAMOUS EPITHELIAL CELLS PRESENT     NO ORGANISMS SEEN     Performed at [facility]   Culture     Final   Value: FEW CANDIDA ALBICANS     Performed at [facility]   Report Status 01/08/2013 FINAL   Final     Studies: Dg Chest Port 1 View  01/07/2013   CLINICAL DATA:  Pneumonia,  edema.  EXAM: PORTABLE CHEST - 1 VIEW  COMPARISON:  01/06/2013.  FINDINGS: Diffuse interstitial opacities are decreased. There is still diffuse reticular nodular densities. Small bilateral pleural effusions. Stable heart size. No pneumothorax.  IMPRESSION: 1. Pulmonary edema is improved since 1 day prior. 2. Persistent lung opacity may represent residual edema or pneumonia. 3. Small bilateral pleural effusions.   Electronically Signed   By: Tiburcio Pea   On: 01/07/2013 05:37    Scheduled Meds: . aspirin  325 mg Oral Daily  . aztreonam  2 g Intravenous Q8H  . bisoprolol  15 mg Oral Daily  . Chlorhexidine Gluconate Cloth  6 each Topical Q0600  . estradiol  2 mg Oral Daily  . heparin subcutaneous  5,000 Units Subcutaneous Q8H  . levalbuterol  0.63 mg Nebulization Q6H  . linezolid  600 mg Oral Q12H  . mupirocin ointment  1 application Nasal BID  . pantoprazole  40 mg Oral Daily  . sodium chloride  3 mL Intravenous Q12H  . tiotropium  18 mcg Inhalation Daily   Continuous Infusions:   Principal Problem:   Severe sepsis Active Problems:   Atrial fibrillation with rapid ventricular response   CMML (chronic myelomonocytic leukemia)   Acute respiratory failure   Obstructive chronic bronchitis with exacerbation    Time spent: 35    [facility]  [facility] Pager [phone]. If 7PM-7AM, please contact night-coverage at [email or web address], password [facility] 01/08/2013, 10:51 AM  LOS: 4 days

## 2013-01-08 NOTE — Progress Notes (Signed)
INITIAL NUTRITION ASSESSMENT  DOCUMENTATION CODES Per approved criteria  -Not Applicable   INTERVENTION: RD provided diet education during this visit. Encourage meals as able. Consider Regular diet to promote additional PO intake. Add Resource Breeze po daily prn, each supplement provides 250 kcal and 9 grams of protein. RD to continue to follow nutrition care plan.  NUTRITION DIAGNOSIS: Food-and nutrition-related knowledge deficit related to limited prior diet education as evidenced by pt report.   Goal: Verbalize basic understanding of "low tyramine" diet. Met. Intake to meet >90% of estimated nutrition needs.  Monitor:  weight trends, lab trends, I/O's, PO intake, supplement tolerance  Reason for Assessment: Use of Zyvox  62 y.o. female  Admitting Dx: Severe sepsis  ASSESSMENT: PMHx significant for CMML, ischemic colitis s/p colectomy with ostomy. Admitted with SOB x several days. Work-up reveals sepsis 2/2 PNA.  Pt with abdominal and groin incision. She states that she is eating fair. Per chart review, pt has been given information in past on ostomy nutrition.   RD drawn to chart secondary to current prescription of zyvox, a medication with potential interactions with high tyramine-containing foods. Pt has been on zyvox in the past and was not aware of diet restrictions.  RD provided "Tyramine-Restricted Nutrition Therapy" handout from the Academy of [employer]etics and discussed importance of this restriction while taking current medication. Reviewed list of foods to avoid. Teach back method used. Pt states that she doesn't really eat any of the foods that are considered "high tyramine." Pt is also frustrated with current diet restriction of Heart Healthy diet. Encouraged oral intake.  Sodium is trending up, but remains low, most recent is 126.  Height: Ht Readings from Last 1 Encounters:  01/04/13 5\' 5"  (1.651 m)    Weight: Wt Readings from Last 1  Encounters:  01/08/13 147 lb 6.4 oz (66.86 kg)    Ideal Body Weight: 125 lb  % Ideal Body Weight: 85%  Wt Readings from Last 10 Encounters:  01/08/13 147 lb 6.4 oz (66.86 kg)  12/24/12 144 lb 12.8 oz (65.681 kg)  12/13/12 150 lb 12.8 oz (68.402 kg)  11/29/12 147 lb 6.4 oz (66.86 kg)  11/21/12 148 lb (67.132 kg)  11/19/12 149 lb 12.8 oz (67.949 kg)  09/19/12 148 lb 9.6 oz (67.405 kg)  08/30/12 146 lb (66.225 kg)  08/17/12 151 lb 12.8 oz (68.856 kg)  08/14/12 152 lb 9.6 oz (69.219 kg)    Usual Body Weight: 150 lb  % Usual Body Weight: 98%  BMI:  Body mass index is 24.53 kg/(m^2). WNL  Estimated Nutritional Needs: Kcal: 1500 - 1700 Protein: 79 - 90 Fluid: approx 1.7 liters daily  Skin: abdominal and groin incisions  Diet Order: Cardiac  EDUCATION NEEDS: -Education needs addressed   Intake/Output Summary (Last 24 hours) at 01/08/13 1440 Last data filed at 01/08/13 0947  Gross per 24 hour  Intake    431 ml  Output      0 ml  Net    431 ml    Last BM: 9/21  Labs:   Recent Labs Lab 01/06/13 0408 01/07/13 0345 01/08/13 0410  NA 125* 126* 126*  K 3.5 4.2 4.4  CL 95* 97 95*  CO2 20 17* 20  BUN 20 18 14   CREATININE 0.94 0.79 0.79  CALCIUM 6.7* 6.7* 7.1*  GLUCOSE 99 106* 93    CBG (last 3)  No results found for this basename: GLUCAP,  in the last 72 hours  Scheduled Meds: .  aspirin  325 mg Oral Daily  . aztreonam  2 g Intravenous Q8H  . bisoprolol  15 mg Oral Daily  . Chlorhexidine Gluconate Cloth  6 each Topical Q0600  . estradiol  2 mg Oral Daily  . heparin subcutaneous  5,000 Units Subcutaneous Q8H  . levalbuterol  0.63 mg Nebulization Q6H  . linezolid  600 mg Oral Q12H  . mupirocin ointment  1 application Nasal BID  . pantoprazole  40 mg Oral Daily  . sodium chloride  3 mL Intravenous Q12H  . tiotropium  18 mcg Inhalation Daily    Continuous Infusions:   Past Medical History  Diagnosis Date  . Hypertension     She has a past hx of  essential  . 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 Inflammation of joint of knee     Since we last last saw her she developed problems with an acute which required surgical drainage by her orthopedist Dr. Cleophas Dunker.  Marland Kitchen MRSA (methicillin resistant Staphylococcus aureus)     Knee surgery drainage was positive for MRSA and she was treated with 3 weeks of doxycycline successfully.  . Diarrhea     Mild  . Exogenous obesity   . GERD (gastroesophageal reflux disease)     2 hosp.- ischemic colitis - residual of Norovirus, 05/2011- sm. bowel obstruction  . Headache(784.0)     migraine headache on occas, less now than when she was younger   . Arthritis     L hip, back, neck   . History of blood transfusion sept 2013    04/2011- /w ischemic colitis , trouble with matching blood  sept 2013  . Anemia     will see hematology consult prior to surgery, recommended by Dr. Patty Sermons  . Anemia 12/15/2010  . Ischemic colitis 01/31/2012  . Atrial flutter     during hospitalization, 04/2011- related to anemia & illness/stress   . Pneumonia     04/2011- not hospitalized , pt. denies SOB, changes in chest, breathing  . 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 Dizziness     occasional    Past Surgical History  Procedure Laterality Date  . Knee surgery      I&D- 2008, post laceration   . Colonoscopy  05/16/2011    Procedure: COLONOSCOPY;  Surgeon: Vertell Novak., MD;  Location: [facility];  Service: Endoscopy;  Laterality: N/A;  . Small intestine surgery  1992, 1999  . Laparotomy and lysis of adhesions    . Total hip arthroplasty  10/25/2011    Procedure: TOTAL HIP ARTHROPLASTY;  Surgeon: Valeria Batman, MD;  Location: [facility];  Service: Orthopedics;  Laterality: Left;  . Appendectomy  1962  . Abdominal hysterectomy  1988  . Partial colectomy and colostomy  sept 2013    mucous fistula done  . Partial colectomy   05/17/2012    Procedure: PARTIAL COLECTOMY;  Surgeon: Adolph Pollack, MD;  Location: [facility];  Service: General;  Laterality: N/A;  . Colostomy closure  05/17/2012    Procedure: COLOSTOMY CLOSURE;  Surgeon: Adolph Pollack, MD;  Location: [facility];  Service: General;  Laterality: N/A;  . Laparotomy  05/17/2012    Procedure: EXPLORATORY LAPAROTOMY;  Surgeon: Adolph Pollack, MD;  Location: [facility];  Service: General;;  . Lysis of adhesion  05/17/2012    Procedure: LYSIS OF ADHESION;  Surgeon: Tawanna Cooler  Laurie Panda, MD;  Location: [facility];  Service: General;;  . Esophageal biopsy  05/17/2012    Procedure: BIOPSY;  Surgeon: Adolph Pollack, MD;  Location: [facility];  Service: General;;  omental biopsy  . Laparotomy  05/22/2012    Procedure: EXPLORATORY LAPAROTOMY;  Surgeon: Adolph Pollack, MD;  Location: [facility];  Service: General;  Laterality: N/A;  DRAINAGE  INTRA-ABDOMINAL ABSCESS/LYSIS OF ADHESIONSFOR SMALL BOWEL OBSTRUCTION/DIVERTING LOOP ILEOSTOMY    Jarold Motto MS, RD, LDN Pager: [phone] After-hours pager: [phone]

## 2013-01-08 NOTE — Progress Notes (Signed)
Pt mouth is sore and white patches noted in mouth; texted MD to request order for med for possible thrush. SRP, RN

## 2013-01-08 NOTE — Progress Notes (Signed)
OT Cancellation Note  Patient Details Name: [name] MRN: [id number] DOB: [birth year]   Cancelled Treatment:    Reason Eval/Treat Not Completed: OT screened, no needs identified, will sign off. Pt Mod I - I with ADLs and ADL mobility  Galen Manila 01/08/2013, 1:32 PM

## 2013-01-08 NOTE — Progress Notes (Signed)
    Subjective:  No chest pain or palpitations. No complaints this am.   Objective:  Vital Signs in the last 24 hours: Temp:  [97.1 F (36.2 C)-100.3 F (37.9 C)] 97.1 F (36.2 C) (09/23 0516) Pulse Rate:  [56-97] 97 (09/23 0516) Resp:  [16-21] 18 (09/23 0516) BP: (123-147)/(63-86) 128/63 mmHg (09/23 0516) SpO2:  [96 %-99 %] 97 % (09/22 1952) Weight:  [147 lb 6.4 oz (66.86 kg)] 147 lb 6.4 oz (66.86 kg) (09/23 0516)  Intake/Output from previous day: 09/22 0701 - 09/23 0700 In: 110 [I.V.:60; IV Piggyback:50] Out: 350 [Urine:350]  Physical Exam: Pt is alert and oriented, NAD HEENT: normal Neck: JVP - normal Lungs: CTA bilaterally CV: RRR without murmur  Abd: soft, NT Ext: no C/C/E, distal pulses intact and equal Skin: warm/dry no rash   Lab Results:  Recent Labs  01/07/13 0345 01/08/13 0410  WBC 4.7 3.7*  HGB 9.8* 9.4*  PLT 256 330    Recent Labs  01/07/13 0345 01/08/13 0410  NA 126* 126*  K 4.2 4.4  CL 97 95*  CO2 17* 20  GLUCOSE 106* 93  BUN 18 14  CREATININE 0.79 0.79   No results found for this basename: TROPONINI, CK, MB,  in the last 72 hours  Cardiac Studies: No new studies   Tele: Sinus rhythm, no further atrial fib  Assessment/Plan:  Atrial fibrillation with RVR. Pt continues to be stable from cardiac perspective. Recommend continue current Rx with bisoprolol 15 mg daily and ASA. She has appt with Dr Patty Sermons 11/4. Please call if further questions or if CV problems arise. thx  Tonny Bollman, M.D. 01/08/2013, 8:28 AM

## 2013-01-09 ENCOUNTER — Inpatient Hospital Stay (HOSPITAL_COMMUNITY): Payer: BC Managed Care – PPO

## 2013-01-09 LAB — CBC
HCT: 28.5 % — ABNORMAL LOW (ref 36.0–46.0)
Hemoglobin: 9.3 g/dL — ABNORMAL LOW (ref 12.0–15.0)
MCH: 30.8 pg (ref 26.0–34.0)
MCHC: 32.6 g/dL (ref 30.0–36.0)
MCV: 94.4 fL (ref 78.0–100.0)

## 2013-01-09 LAB — BASIC METABOLIC PANEL
BUN: 12 mg/dL (ref 6–23)
CO2: 21 mEq/L (ref 19–32)
Chloride: 98 mEq/L (ref 96–112)
Creatinine, Ser: 0.7 mg/dL (ref 0.50–1.10)
GFR calc non Af Amer: >90 mL/min (ref >90)
Glucose, Bld: 102 mg/dL — ABNORMAL HIGH (ref 70–99)
Potassium: 4.3 mEq/L (ref 3.5–5.1)
Sodium: 127 mEq/L — ABNORMAL LOW (ref 135–145)

## 2013-01-09 LAB — PRO B NATRIURETIC PEPTIDE: Pro B Natriuretic peptide (BNP): 6842 pg/mL — ABNORMAL HIGH (ref 0–125)

## 2013-01-09 MED ORDER — FUROSEMIDE 40 MG PO TABS
40.0000 mg | ORAL_TABLET | Freq: Every day | ORAL | Status: DC
  Filled 2013-01-09: qty 1

## 2013-01-09 MED ORDER — BISOPROLOL FUMARATE 5 MG PO TABS
15.0000 mg | ORAL_TABLET | Freq: Every day | ORAL | Status: DC
  Administered 2013-01-09 – 2013-01-11 (×3): 15 mg via ORAL
  Filled 2013-01-09 (×4): qty 3

## 2013-01-09 MED ORDER — FUROSEMIDE 10 MG/ML IJ SOLN: 40.0000 mg | Freq: Once | INTRAMUSCULAR | Status: DC

## 2013-01-09 NOTE — Progress Notes (Signed)
Pt sats will not pick up. Rt tried several machines. Pt is in no distress at this time.

## 2013-01-09 NOTE — Progress Notes (Addendum)
[facility] PROGRESS NOTE  Decie Thang AVW:[id number] DOB: 10-13-1950 DOA: 01/04/2013 PCP: Cassell Clement, MD  Assessment/Plan: Atrial fibrillation with RVR -rate controlled on bisoprolol 15mg  daily -continue ASA -not candidate for anticoagulation due to risk of bleeding -She has appt with Dr Patty Sermons 11/4 Hyponatremia -she states this is a chronic problem for her( although her NA was nl 136 in 06/2012) -her BNP was elevated this hospital stay and had mild pulm edema, will fluid restrict at this time -check urine lytes, follow and further treat accordingly. Patient refusing diuretics. Acute respiratory failure: Due to HCAP and mild pulm edema  Clinically improved, Continue spiriva, xopenex, on day 6 of abx. Lasix resumed however patient denies any history of CHF or daily use of lasix at home. Patient states was on it only as needed. Patient resistant to lasix and as such will d/c. Per Pulm, Will need further outpt assessment with PFT's  F/u CXR in am  Oxygen to keep SpO2 > 92% Severe sepsis w/o septic shock >> resolved. -cultures so far neg, on day 6/7-10 of abx as above-follow Ostomy/Abdominal Wound She is followed by [facility] Chronic anemia /CMML(chronic myelomonocytic leukemia) -hgb stable, follow up outpt Chronic neck pain -continue vicodin for pain control -follow up with Dr Danielle Dess outpt Oral Thrush Continue diflucan   Code Status: full Family Communication: none at bedside  Disposition Plan: to home when medically stable   Consultants:  Was on CCM service and transferred to [facility] beginning 9/23  Cards s/o 9/23  Procedures:  CXR 01/07/13, 01/09/13  Antibiotics: 1. Linezolid 9/19- 2. Aztreonam 9/19-     HPI/Subjective: Pt states feeling better. No SOB/CP  Objective: Filed Vitals:   01/09/13 1350  BP: 124/65  Pulse: 85  Temp: 97.9 F (36.6 C)  Resp: 16    Intake/Output Summary (Last 24 hours) at 01/09/13 1439 Last data filed at 01/08/13 1900  Gross per 24 hour  Intake    290 ml  Output      0 ml  Net    290 ml   Filed Weights   01/07/13 0400 01/08/13 0516 01/09/13 0500  Weight: 65.5 kg (144 lb 6.4 oz) 66.86 kg (147 lb 6.4 oz) 66.8 kg (147 lb 4.3 oz)    Exam:  General: alert & oriented x 3 In NAD Cardiovascular: RRR, nl S1 s2 Respiratory: decreased BS at bases, few rhonchi Abdomen: soft +BS NT/ND, no masses palpable Extremities: No cyanosis and no edema    Data Reviewed: Basic Metabolic Panel:  Recent Labs Lab 01/05/13 0340 01/06/13 0408 01/07/13 0345 01/08/13 0410 01/09/13 0426  NA 124* 125* 126* 126* 127*  K 3.6 3.5 4.2 4.4 4.3  CL 93* 95* 97 95* 98  CO2 17* 20 17* 20 21  GLUCOSE 117* 99 106* 93 102*  BUN 25* 20 18 14 12   CREATININE 1.16* 0.94 0.79 0.79 0.70  CALCIUM 6.7* 6.7* 6.7* 7.1* 7.4*   Liver Function Tests: No results found for this basename: AST, ALT, ALKPHOS, BILITOT, PROT, ALBUMIN,  in the last 168 hours No results found for this basename: LIPASE, AMYLASE,  in the last 168 hours No results found for this basename: AMMONIA,  in the last 168 hours CBC:  Recent Labs Lab 01/05/13 0340 01/06/13 0408 01/07/13 0345 01/08/13 0410 01/09/13 0426  WBC 9.3 6.0 4.7 3.7* 5.8  HGB 9.6* 9.4* 9.8* 9.4* 9.3*  HCT 28.4* 28.1* 28.1* 27.5* 28.5*  MCV 92.2 92.4 97.2 98.2 94.4  PLT 178 221 256 330 330  Cardiac Enzymes:  Recent Labs Lab 01/04/13 1455  TROPONINI <0.30   BNP (last 3 results)  Recent Labs  01/04/13 1455 01/05/13 0345 01/09/13 0426  PROBNP 7057.0* 5651.0* 6842.0*   CBG: No results found for this basename: GLUCAP,  in the last 168 hours  Recent Results (from the past 240 hour(s))  CULTURE, BLOOD (ROUTINE X 2)     Status: None   Collection Time    01/04/13  4:30 PM      Result Value Range Status   Specimen Description BLOOD RIGHT ARM   Final   Special Requests BOTTLES DRAWN AEROBIC AND ANAEROBIC 5CC   Final   Culture  Setup Time     Final   Value: 01/04/2013 20:24      Performed at [facility]   Culture     Final   Value:        BLOOD CULTURE RECEIVED NO GROWTH TO DATE CULTURE WILL BE HELD FOR 5 DAYS BEFORE ISSUING A FINAL NEGATIVE REPORT     Performed at [facility]   Report Status PENDING   Incomplete  URINE CULTURE     Status: None   Collection Time    01/05/13  3:06 AM      Result Value Range Status   Specimen Description URINE, CATHETERIZED   Final   Special Requests Normal   Final   Culture  Setup Time     Final   Value: 01/05/2013 17:40     Performed at [facility] Count     Final   Value: NO GROWTH     Performed at [facility]   Culture     Final   Value: NO GROWTH     Performed at [facility]   Report Status 01/06/2013 FINAL   Final  CULTURE, EXPECTORATED SPUTUM-ASSESSMENT     Status: None   Collection Time    01/06/13  6:37 AM      Result Value Range Status   Specimen Description SPU   Final   Special Requests Normal   Final   Sputum evaluation     Final   Value: MICROSCOPIC FINDINGS SUGGEST THAT THIS SPECIMEN IS NOT REPRESENTATIVE OF LOWER RESPIRATORY SECRETIONS. PLEASE RECOLLECT.     S RUSSELL AT 0718 ON 09.21.2014 BY NBROOKS   Report Status 01/06/2013 FINAL   Final  CULTURE, EXPECTORATED SPUTUM-ASSESSMENT     Status: None   Collection Time    01/06/13  8:25 AM      Result Value Range Status   Specimen Description SPUTUM   Final   Special Requests Normal   Final   Sputum evaluation     Final   Value: THIS SPECIMEN IS ACCEPTABLE. RESPIRATORY CULTURE REPORT TO FOLLOW.   Report Status 01/06/2013 FINAL   Final  CULTURE, RESPIRATORY (NON-EXPECTORATED)     Status: None   Collection Time    01/06/13  8:25 AM      Result Value Range Status   Specimen Description SPUTUM   Final   Special Requests NONE   Final   Gram Stain     Final   Value: RARE WBC PRESENT, PREDOMINANTLY PMN     RARE SQUAMOUS EPITHELIAL CELLS PRESENT     NO ORGANISMS SEEN     Performed at [facility]   Culture     Final   Value: FEW CANDIDA ALBICANS     Performed at [facility]  Report Status 01/08/2013 FINAL   Final     Studies: Dg Chest Port 1 View  01/09/2013   CLINICAL DATA:  Acute respiratory failure. Sepsis.  EXAM: PORTABLE CHEST - 1 VIEW  COMPARISON:  Single view of the chest 01/07/2013.  FINDINGS: Small left pleural effusion and basilar airspace disease appear worsened. Patchy airspace disease in the right lower lung zone also appears increased. Heart size is normal. No pneumothorax is identified.  IMPRESSION: Increased patchy bilateral airspace disease and small left effusion worrisome for pneumonia.   Electronically Signed   By: Drusilla Kanner M.D.   On: 01/09/2013 05:37    Scheduled Meds: . aspirin  325 mg Oral Daily  . aztreonam  2 g Intravenous Q8H  . bisoprolol  15 mg Oral Daily  . Chlorhexidine Gluconate Cloth  6 each Topical Q0600  . estradiol  2 mg Oral Daily  . fluconazole (DIFLUCAN) IV  200 mg Intravenous QHS  . furosemide  40 mg Oral Daily  . heparin subcutaneous  5,000 Units Subcutaneous Q8H  . levalbuterol  0.63 mg Nebulization Q6H  . linezolid  600 mg Oral Q12H  . mupirocin ointment  1 application Nasal BID  . pantoprazole  40 mg Oral Daily  . sodium chloride  3 mL Intravenous Q12H  . tiotropium  18 mcg Inhalation Daily   Continuous Infusions:   Principal Problem:   Severe sepsis Active Problems:   Atrial fibrillation with rapid ventricular response   CMML (chronic myelomonocytic leukemia)   Acute respiratory failure   Obstructive chronic bronchitis with exacerbation    Time spent: 35 mins    [facility]  [facility] Pager [phone]. If 7PM-7AM, please contact night-coverage at [email or web address], password [facility] 01/09/2013, 2:39 PM  LOS: 5 days

## 2013-01-10 LAB — BASIC METABOLIC PANEL
BUN: 11 mg/dL (ref 6–23)
CO2: 19 mEq/L (ref 19–32)
Calcium: 7.2 mg/dL — ABNORMAL LOW (ref 8.4–10.5)
Creatinine, Ser: 0.66 mg/dL (ref 0.50–1.10)
Glucose, Bld: 90 mg/dL (ref 70–99)
Potassium: 4.2 mEq/L (ref 3.5–5.1)

## 2013-01-10 LAB — CULTURE, BLOOD (ROUTINE X 2)

## 2013-01-10 LAB — CBC
MCH: 34.4 pg — ABNORMAL HIGH (ref 26.0–34.0)
MCHC: 35.1 g/dL (ref 30.0–36.0)
MCV: 98.1 fL (ref 78.0–100.0)
Platelets: 262 10*3/uL (ref 150–400)
RBC: 2.7 MIL/uL — ABNORMAL LOW (ref 3.87–5.11)
RDW: 16.4 % — ABNORMAL HIGH (ref 11.5–15.5)

## 2013-01-10 MED ORDER — FLUCONAZOLE 100 MG PO TABS
100.0000 mg | ORAL_TABLET | Freq: Every day | ORAL | Status: DC
  Administered 2013-01-10 – 2013-01-11 (×2): 100 mg via ORAL
  Filled 2013-01-10 (×2): qty 1

## 2013-01-10 MED ORDER — LEVOFLOXACIN 750 MG PO TABS
750.0000 mg | ORAL_TABLET | Freq: Every day | ORAL | Status: DC
  Administered 2013-01-10 – 2013-01-11 (×2): 750 mg via ORAL
  Filled 2013-01-10 (×2): qty 1

## 2013-01-10 MED ORDER — LEVALBUTEROL HCL 0.63 MG/3ML IN NEBU
0.6300 mg | INHALATION_SOLUTION | Freq: Three times a day (TID) | RESPIRATORY_TRACT | Status: DC
  Administered 2013-01-10 – 2013-01-11 (×3): 0.63 mg via RESPIRATORY_TRACT
  Filled 2013-01-10 (×7): qty 3

## 2013-01-10 MED ORDER — BUPROPION HCL ER (XL) 300 MG PO TB24
300.0000 mg | ORAL_TABLET | Freq: Every day | ORAL | Status: DC
  Administered 2013-01-11: 11:00:00 300 mg via ORAL
  Filled 2013-01-10 (×2): qty 1

## 2013-01-10 MED ORDER — FUROSEMIDE 10 MG/ML IJ SOLN
40.0000 mg | Freq: Once | INTRAMUSCULAR | Status: AC
  Administered 2013-01-10: 17:00:00 40 mg via INTRAVENOUS
  Filled 2013-01-10: qty 4

## 2013-01-10 NOTE — Progress Notes (Signed)
ANTIBIOTIC CONSULT NOTE - FOLLOW UP  Pharmacy Consult for Aztreonam, antibiotic renal dose adjustment (Zyvox) Indication: HCAP  Allergies  Allergen Reactions  . Vancomycin Hives and Rash    ? wheezing  . Ativan [Lorazepam]     confusion  . Codeine Nausea And Vomiting  . Tetanus Toxoids Other (See Comments)    serum  . Penicillins Hives and Rash  . Xarelto [Rivaroxaban] Hives and Rash    ?    Patient Measurements: Height: 5\' 5"  (165.1 cm) Weight: 147 lb 14.9 oz (67.1 kg) IBW/kg (Calculated) : 57   Vital Signs: Temp: 98.4 F (36.9 C) (09/25 0514) Temp src: Oral (09/25 0514) BP: 144/76 mmHg (09/25 0514) Pulse Rate: 81 (09/25 0514) Intake/Output from previous day: 09/24 0701 - 09/25 0700 In: 1120 [P.O.:720; IV Piggyback:400] Out: -  Intake/Output from this shift:    Labs:  Recent Labs  01/08/13 0410 01/09/13 0426 01/10/13 0445  WBC 3.7* 5.8 3.6*  HGB 9.4* 9.3* 9.3*  PLT 330 330 262  CREATININE 0.79 0.70 0.66   Estimated Creatinine Clearance: 64.8 ml/min (by C-G formula based on Cr of 0.66). No results found for this basename: VANCOTROUGH, Leodis Binet, VANCORANDOM, GENTTROUGH, GENTPEAK, GENTRANDOM, TOBRATROUGH, TOBRAPEAK, TOBRARND, AMIKACINPEAK, AMIKACINTROU, AMIKACIN,  in the last 72 hours   Microbiology: Recent Results (from the past 720 hour(s))  MRSA PCR SCREENING     Status: Abnormal   Collection Time    12/20/12  7:23 PM      Result Value Range Status   MRSA by PCR POSITIVE (*) NEGATIVE Final   Comment:            The GeneXpert MRSA Assay (FDA     approved for NASAL specimens     only), is one component of a     comprehensive MRSA colonization     surveillance program. It is not     intended to diagnose MRSA     infection nor to guide or     monitor treatment for     MRSA infections.     RESULT CALLED TO, READ BACK BY AND VERIFIED WITH:     LEWIS,S/4E @2217  ON 12/20/12 BY KARCZEWSKI,S.  CULTURE, BLOOD (ROUTINE X 2)     Status: None   Collection  Time    01/04/13  4:30 PM      Result Value Range Status   Specimen Description BLOOD RIGHT ARM   Final   Special Requests BOTTLES DRAWN AEROBIC AND ANAEROBIC 5CC   Final   Culture  Setup Time     Final   Value: 01/04/2013 20:24     Performed at [facility]   Culture     Final   Value: NO GROWTH 5 DAYS     Performed at [facility]   Report Status 01/10/2013 FINAL   Final  URINE CULTURE     Status: None   Collection Time    01/05/13  3:06 AM      Result Value Range Status   Specimen Description URINE, CATHETERIZED   Final   Special Requests Normal   Final   Culture  Setup Time     Final   Value: 01/05/2013 17:40     Performed at [facility] Count     Final   Value: NO GROWTH     Performed at [facility]   Culture     Final   Value: NO GROWTH     Performed at  [facility]   Report Status 01/06/2013 FINAL   Final  CULTURE, EXPECTORATED SPUTUM-ASSESSMENT     Status: None   Collection Time    01/06/13  6:37 AM      Result Value Range Status   Specimen Description SPU   Final   Special Requests Normal   Final   Sputum evaluation     Final   Value: MICROSCOPIC FINDINGS SUGGEST THAT THIS SPECIMEN IS NOT REPRESENTATIVE OF LOWER RESPIRATORY SECRETIONS. PLEASE RECOLLECT.     S RUSSELL AT 0718 ON 09.21.2014 BY NBROOKS   Report Status 01/06/2013 FINAL   Final  CULTURE, EXPECTORATED SPUTUM-ASSESSMENT     Status: None   Collection Time    01/06/13  8:25 AM      Result Value Range Status   Specimen Description SPUTUM   Final   Special Requests Normal   Final   Sputum evaluation     Final   Value: THIS SPECIMEN IS ACCEPTABLE. RESPIRATORY CULTURE REPORT TO FOLLOW.   Report Status 01/06/2013 FINAL   Final  CULTURE, RESPIRATORY (NON-EXPECTORATED)     Status: None   Collection Time    01/06/13  8:25 AM      Result Value Range Status   Specimen Description SPUTUM   Final   Special Requests NONE   Final   Gram Stain     Final    Value: RARE WBC PRESENT, PREDOMINANTLY PMN     RARE SQUAMOUS EPITHELIAL CELLS PRESENT     NO ORGANISMS SEEN     Performed at [facility]   Culture     Final   Value: FEW CANDIDA ALBICANS     Performed at [facility]   Report Status 01/08/2013 FINAL   Final    Assessment: 21 yoF admitted 9/19 with SOB and fatigue x 2 weeks following hospital discharge 2 weeks ago for which patient was admitted for acute respiratory failue 2/2 CHF exacerbation. Patient started on aztreonam and linezolid (allergies to penicillin and vancomycin - hives) for HCAP treatment.   Day #7 Aztreonam 2g IV q8h and Zyvox 600mg  PO q12h, D#2 fluconazole for thrush  Afebrile  Cultures unrevealing  CXR 9/24 showed increased patchy BL airspace disease and small left effusion worrisome for PNA.  WBC low @ 3.6.  Zyvox can cause myelosuppresion.  Recommend considering d/c of zyvox if MRSA coverage no longer needed.    Renal function stable, CrCl ~65 ml/min  Goal of Therapy:  Eradication of infection, doses adjusted per renal clearance  Plan:   Continue Aztreonam 2g IV q8h  Continue Zyvox 600mg  PO q12h - consider d/c if MRSA coverage no longer warranted  Haynes Hoehn, PharmD 01/10/2013, 11:03 AM  Pager: [phone]

## 2013-01-10 NOTE — Progress Notes (Signed)
[facility] PROGRESS NOTE  [name] ZOX:[id number] DOB: [birth year] DOA: 01/04/2013 PCP: Cassell Clement, MD  Assessment/Plan: Atrial fibrillation with RVR -rate controlled on bisoprolol 15mg  daily -continue ASA -not candidate for anticoagulation due to risk of bleeding -She has appt with Dr Patty Sermons 11/4 Hyponatremia -she states this is a chronic problem for her( although her NA was nl 136 in 06/2012) -her BNP was elevated this hospital stay and had mild pulm edema, will fluid restrict at this time -check urine lytes, follow and further treat accordingly. Patient has agreed to Lasix 40 mg IV x1. Follow.  Acute respiratory failure: Due to HCAP and mild pulm edema  Clinically improved, Continue spiriva, xopenex, on day 7 of abx. Lasix resumed however patient denies any history of CHF or daily use of lasix at home. Patient states was on it only as needed. Patient resistant to lasix yesterday. Patient has agreed to take 1 dose of Lasix 40 mg IV x1. Follow. Per Pulm, Will need further outpt assessment with PFT's  F/u CXR in am  Oxygen to keep SpO2 > 92% Severe sepsis w/o septic shock >> resolved. -cultures so far neg, on day 7/10 of abx as above-follow. Will discontinue linezolid and aztreonam. Will start patient on oral Levaquin to complete a ten-day course of therapy. Ostomy/Abdominal Wound She is followed by [facility] Chronic anemia /CMML(chronic myelomonocytic leukemia) -hgb stable, follow up outpt Chronic neck pain -continue vicodin for pain control -follow up with Dr Danielle Dess outpt Oral Thrush Continue diflucan   Code Status: full Family Communication: none at bedside  Disposition Plan: to home when medically stable   Consultants:  Was on CCM service and transferred to [facility] beginning 9/23  Cards s/o 9/23  Procedures:  CXR 01/07/13, 01/09/13  Antibiotics: 1. Linezolid 9/19-9/25/14 2. Aztreonam 9/19-9/25/14 3. Oral Levaquin 01/10/2013     HPI/Subjective: Pt  states feeling better. No SOB/CP  Objective: Filed Vitals:   01/10/13 1345  BP: 129/72  Pulse: 83  Temp: 98.1 F (36.7 C)  Resp: 16    Intake/Output Summary (Last 24 hours) at 01/10/13 1454 Last data filed at 01/10/13 1349  Gross per 24 hour  Intake   1240 ml  Output      0 ml  Net   1240 ml   Filed Weights   01/08/13 0516 01/09/13 0500 01/10/13 0500  Weight: 66.86 kg (147 lb 6.4 oz) 66.8 kg (147 lb 4.3 oz) 67.1 kg (147 lb 14.9 oz)    Exam:  General: alert & oriented x 3 In NAD Cardiovascular: RRR, nl S1 s2 Respiratory: decreased BS at bases, scattered crackles Abdomen: soft +BS NT/ND, no masses palpable Extremities: No cyanosis and no edema    Data Reviewed: Basic Metabolic Panel:  Recent Labs Lab 01/06/13 0408 01/07/13 0345 01/08/13 0410 01/09/13 0426 01/10/13 0445  NA 125* 126* 126* 127* 126*  K 3.5 4.2 4.4 4.3 4.2  CL 95* 97 95* 98 98  CO2 20 17* 20 21 19   GLUCOSE 99 106* 93 102* 90  BUN 20 18 14 12 11   CREATININE 0.94 0.79 0.79 0.70 0.66  CALCIUM 6.7* 6.7* 7.1* 7.4* 7.2*   Liver Function Tests: No results found for this basename: AST, ALT, ALKPHOS, BILITOT, PROT, ALBUMIN,  in the last 168 hours No results found for this basename: LIPASE, AMYLASE,  in the last 168 hours No results found for this basename: AMMONIA,  in the last 168 hours CBC:  Recent Labs Lab 01/06/13 0408 01/07/13 0345 01/08/13 0410 01/09/13 0426  01/10/13 0445  WBC 6.0 4.7 3.7* 5.8 3.6*  HGB 9.4* 9.8* 9.4* 9.3* 9.3*  HCT 28.1* 28.1* 27.5* 28.5* 26.5*  MCV 92.4 97.2 98.2 94.4 98.1  PLT 221 256 330 330 262   Cardiac Enzymes:  Recent Labs Lab 01/04/13 1455  TROPONINI <0.30   BNP (last 3 results)  Recent Labs  01/04/13 1455 01/05/13 0345 01/09/13 0426  PROBNP 7057.0* 5651.0* 6842.0*   CBG: No results found for this basename: GLUCAP,  in the last 168 hours  Recent Results (from the past 240 hour(s))  CULTURE, BLOOD (ROUTINE X 2)     Status: None   Collection  Time    01/04/13  4:30 PM      Result Value Range Status   Specimen Description BLOOD RIGHT ARM   Final   Special Requests BOTTLES DRAWN AEROBIC AND ANAEROBIC 5CC   Final   Culture  Setup Time     Final   Value: 01/04/2013 20:24     Performed at [facility]   Culture     Final   Value: NO GROWTH 5 DAYS     Performed at [facility]   Report Status 01/10/2013 FINAL   Final  URINE CULTURE     Status: None   Collection Time    01/05/13  3:06 AM      Result Value Range Status   Specimen Description URINE, CATHETERIZED   Final   Special Requests Normal   Final   Culture  Setup Time     Final   Value: 01/05/2013 17:40     Performed at [facility] Count     Final   Value: NO GROWTH     Performed at [facility]   Culture     Final   Value: NO GROWTH     Performed at [facility]   Report Status 01/06/2013 FINAL   Final  CULTURE, EXPECTORATED SPUTUM-ASSESSMENT     Status: None   Collection Time    01/06/13  6:37 AM      Result Value Range Status   Specimen Description SPU   Final   Special Requests Normal   Final   Sputum evaluation     Final   Value: MICROSCOPIC FINDINGS SUGGEST THAT THIS SPECIMEN IS NOT REPRESENTATIVE OF LOWER RESPIRATORY SECRETIONS. PLEASE RECOLLECT.     S RUSSELL AT 0718 ON 09.21.2014 BY NBROOKS   Report Status 01/06/2013 FINAL   Final  CULTURE, EXPECTORATED SPUTUM-ASSESSMENT     Status: None   Collection Time    01/06/13  8:25 AM      Result Value Range Status   Specimen Description SPUTUM   Final   Special Requests Normal   Final   Sputum evaluation     Final   Value: THIS SPECIMEN IS ACCEPTABLE. RESPIRATORY CULTURE REPORT TO FOLLOW.   Report Status 01/06/2013 FINAL   Final  CULTURE, RESPIRATORY (NON-EXPECTORATED)     Status: None   Collection Time    01/06/13  8:25 AM      Result Value Range Status   Specimen Description SPUTUM   Final   Special Requests NONE   Final   Gram Stain     Final    Value: RARE WBC PRESENT, PREDOMINANTLY PMN     RARE SQUAMOUS EPITHELIAL CELLS PRESENT     NO ORGANISMS SEEN     Performed at [facility]  Final   Value: FEW CANDIDA ALBICANS     Performed at [facility]   Report Status 01/08/2013 FINAL   Final     Studies: Dg Chest Port 1 View  01/09/2013   CLINICAL DATA:  Acute respiratory failure. Sepsis.  EXAM: PORTABLE CHEST - 1 VIEW  COMPARISON:  Single view of the chest 01/07/2013.  FINDINGS: Small left pleural effusion and basilar airspace disease appear worsened. Patchy airspace disease in the right lower lung zone also appears increased. Heart size is normal. No pneumothorax is identified.  IMPRESSION: Increased patchy bilateral airspace disease and small left effusion worrisome for pneumonia.   Electronically Signed   By: Drusilla Kanner M.D.   On: 01/09/2013 05:37    Scheduled Meds: . aspirin  325 mg Oral Daily  . aztreonam  2 g Intravenous Q8H  . bisoprolol  15 mg Oral Daily  . [START ON 01/11/2013] buPROPion  300 mg Oral QAC breakfast  . Chlorhexidine Gluconate Cloth  6 each Topical Q0600  . estradiol  2 mg Oral Daily  . fluconazole  100 mg Oral Daily  . furosemide  40 mg Intravenous Once  . heparin subcutaneous  5,000 Units Subcutaneous Q8H  . levalbuterol  0.63 mg Nebulization TID  . linezolid  600 mg Oral Q12H  . mupirocin ointment  1 application Nasal BID  . pantoprazole  40 mg Oral Daily  . sodium chloride  3 mL Intravenous Q12H  . tiotropium  18 mcg Inhalation Daily   Continuous Infusions:   Principal Problem:   Severe sepsis Active Problems:   Atrial fibrillation with rapid ventricular response   CMML (chronic myelomonocytic leukemia)   Acute respiratory failure   Obstructive chronic bronchitis with exacerbation    Time spent: 35 mins    [facility]  [facility] Pager [phone]. If 7PM-7AM, please contact night-coverage at [email or web address], password [facility] 01/10/2013, 2:54 PM   LOS: 6 days

## 2013-01-11 DIAGNOSIS — J1100 Influenza due to unidentified influenza virus with unspecified type of pneumonia: Secondary | ICD-10-CM | POA: Diagnosis present

## 2013-01-11 LAB — CBC
Hemoglobin: 8.6 g/dL — ABNORMAL LOW (ref 12.0–15.0)
MCV: 96.2 fL (ref 78.0–100.0)
Platelets: 254 10*3/uL (ref 150–400)
RBC: 2.62 MIL/uL — ABNORMAL LOW (ref 3.87–5.11)
RDW: 16.1 % — ABNORMAL HIGH (ref 11.5–15.5)
WBC: 3.7 10*3/uL — ABNORMAL LOW (ref 4.0–10.5)

## 2013-01-11 LAB — BASIC METABOLIC PANEL
CO2: 19 mEq/L (ref 19–32)
Calcium: 7.3 mg/dL — ABNORMAL LOW (ref 8.4–10.5)
Creatinine, Ser: 0.74 mg/dL (ref 0.50–1.10)
Potassium: 4.1 mEq/L (ref 3.5–5.1)

## 2013-01-11 LAB — CREATININE, URINE, RANDOM: Creatinine, Urine: 38 mg/dL

## 2013-01-11 MED ORDER — LEVOFLOXACIN 750 MG PO TABS: 750.0000 mg | ORAL_TABLET | Freq: Every day | ORAL | Status: AC

## 2013-01-11 MED ORDER — FUROSEMIDE 10 MG/ML IJ SOLN
40.0000 mg | Freq: Once | INTRAMUSCULAR | Status: DC
  Filled 2013-01-11: qty 4

## 2013-01-11 MED ORDER — TIOTROPIUM BROMIDE MONOHYDRATE 18 MCG IN CAPS: 18.0000 ug | ORAL_CAPSULE | Freq: Every day | RESPIRATORY_TRACT | Status: DC

## 2013-01-11 MED ORDER — FLUCONAZOLE 100 MG PO TABS: 100.0000 mg | ORAL_TABLET | Freq: Every day | ORAL | Status: AC

## 2013-01-11 NOTE — Discharge Summary (Signed)
Physician Discharge Summary  [name] WUJ:[id number] DOB: 11/04/1950 DOA: 01/04/2013  PCP: Cassell Clement, MD  Admit date: 01/04/2013 Discharge date: 01/11/2013  Time spent: 60 minutes   Patient was very insistent on being discharged home although her sodium levels were slowly trending down. Patient refused further management of her hyponatremia and stated that she will followup with PCP as outpatient for further management.  Recommendations for Outpatient Follow-up:  1. Patient is to followup with Cassell Clement, MD early next week as outpatient. Patient will need a basic metabolic profile done to followup on the electrolytes namely the sodium and renal function.  Discharge Diagnoses:  Principal Problem:   Severe sepsis Active Problems:   Atrial fibrillation with rapid ventricular response   Chronic hyponatremia   CMML (chronic myelomonocytic leukemia)   Acute respiratory failure   Obstructive chronic bronchitis with exacerbation   HCAP (healthcare-associated pneumonia)   Discharge Condition: Stable and improved  Diet recommendation: Heart healthy  Filed Weights   01/09/13 0500 01/10/13 0500 01/11/13 0500  Weight: 66.8 kg (147 lb 4.3 oz) 67.1 kg (147 lb 14.9 oz) 68 kg (149 lb 14.6 oz)    History of present illness:  [name] is a 57 F with CMML (Chart Hx, no elevated WBC, no notes), Aflutter, ischemic colitis s/p colectomy with ostomy who presents to [facility] this evening with SOB x several days. She was recently discharged from the hospital for fluid overload in the setting of acute blood loss of unclear etiology. The last several days she has noted some increased SOB as well as a cough which began productive of whitish sputum this evening. She denies recent fevers or sick contacts but has had some chills recently. She denies frank chest pain but had some infrasternal pressure on and off the last few days. She was found to be in Afib with RVR in the ED; she did not note  palpitations but does not usually feel them when in an arrythmia.   Hospital Course:  Atrial fibrillation with RVR  Patient on admission was noted to be in A. fib with RVR. Cardiology consultation was obtained and patient was seen in consultation by Dr. Adolm Joseph. Patient was initially admitted to the ICU team. Per cardiology does no evidence of right-sided heart failure. It was felt patient A. fib with RVR was secondary to hypotension and improved with volume resuscitation. Diltiazem drip was subsequently discontinued. Patient was maintained on a home regimen of bisoprolol 15 mg daily for rate control. It was felt patient was not anticoagulations candidate due to history of bleeding issues from ostomy site and recent need for transfusions. Patient was placed on aspirin for stroke risk reduction. Patient will followup with her cardiologist as outpatient. -She has appt with Dr Patty Sermons 11/4  Hyponatremia  2 in the hospitalization patient was noted to be hyponatremic. Patient stated this is a chronic problem for her( although her NA was nl 136 in 06/2012)  -her BNP was elevated this hospital stay and had mild pulm edema. Patient was restricted. Patient was initially given a dose of Lasix 40 mg IV x1. Urine sodium urine electrolytes were ordered. Serum osmolality was ordered however patient refused as it took more than on stick. Patient's sodium levels were slowly trending down to 125. Chest x-ray which was obtained was worrisome for bilateral pulmonary edema. Patient was reluctant to have any more doses of IV Lasix as she was concern for increased output. Renal consultation was called, however patient was very insistent on being discharged  stating that sodium will improve when she gets back to her normal routine at home and Dr. Patty Sermons was only one that could manage this issue before and up with him early next week. Patient was subsequently discharged per her insistence. Acute respiratory failure: Due to  HCAP and mild pulm edema  Patient was admitted to the critical care service with acute respiratory failure felt to be secondary to healthcare associated pneumonia and mild pulmonary edema. Patient was initially placed on linezolid and aztreonam secondary to multiple allergies. Sputum cultures were obtained however with no growth. Patient was placed on neb treatments. Blood cultures which were obtained at no growth to date. Patient improved clinically. Was subsequently transferred to the hospitalist service. Repeat chest x-ray which was done showed bilateral infiltrates worse on full pulmonary edema. Patient was given a dose of Lasix 40 mg IV x1. Patient however refused any further diuretics. Patient's antibiotic regimen was subsequently transitioned to oral Levaquin. Patient was discharged home on 3 more days of oral Levaquin to complete a ten-day course of antibiotic therapy. Patient will followup with Dr. Patty Sermons as outpatient. Per Pulm, Will need further outpt assessment with PFT's Severe sepsis w/o septic shock >> resolved.  Patient was admitted with severe sepsis to the critical care team. It was felt patient's sepsis was secondary to a healthcare associated pneumonia. Patient was also noted to be hypotensive on admission. Patient was volume resuscitated. Place on a pack antibiotics. Patient was pan cultured and no growth to date. Patient was maintained on aztreonam and linezolid which she tolerated. Patient improved clinically and was subsequently transferred to the hospitalist service on the floor. Cultures which were done were negative to date. Patient was subsequently transitioned to oral Levaquin and discharged on 3 more days of oral antibiotics to complete a ten-day course of therapy. Patient will followup with PCP as outpatient. Ostomy/Abdominal Wound  She is followed by [facility]  Chronic anemia /CMML(chronic myelomonocytic leukemia)  -hgb stable, follow up outpt  Chronic neck pain  -continued on  vicodin for pain control  -follow up with Dr Danielle Dess outpt  Oral Thrush  Patient was noted to have oral thrush during the hospitalization. She was started on IV fluconazole and subsequently transitioned to oral Diflucan. Patient was discharged in a 4 more days of oral Diflucan to complete a ten-day course of therapy.      Procedures: CXR 01/07/13, 01/09/13   Consultations: Was on CCM service and transferred to [facility] beginning 9/23  Cards s/o 9/23   Discharge Exam: Filed Vitals:   01/11/13 0611  BP: 149/86  Pulse: 86  Temp: 98.3 F (36.8 C)  Resp: 16    General: nad Cardiovascular: rrr Respiratory: ctab  Discharge Instructions      Discharge Orders   Future Appointments Provider Department Dept Phone   02/19/2013 11:30 AM Cassell Clement, MD [facility] [place] Office [phone]   Future Orders Complete By Expires   Diet - low sodium heart healthy  As directed    Discharge instructions  As directed    Comments:     Follow up with Cassell Clement, MD next week.   Increase activity slowly  As directed        Medication List         albuterol 108 (90 BASE) MCG/ACT inhaler  Commonly known as:  PROVENTIL HFA;VENTOLIN HFA  Inhale 1-2 puffs into the lungs every 6 (six) hours as needed for wheezing.     aspirin 325 MG EC tablet  Take 325 mg by mouth daily.     bisoprolol 5 MG tablet  Commonly known as:  ZEBETA  Take 1 tablet (5 mg total) by mouth daily before breakfast.     buPROPion 300 MG 24 hr tablet  Commonly known as:  WELLBUTRIN XL  Take 300 mg by mouth daily before breakfast.     celecoxib 200 MG capsule  Commonly known as:  CELEBREX  Take 200 mg by mouth daily.     diphenoxylate-atropine 2.5-0.025 MG per tablet  Commonly known as:  LOMOTIL  Take 1 tablet by mouth at bedtime.     esomeprazole 40 MG capsule  Commonly known as:  NEXIUM  Take 40 mg by mouth every morning.     estradiol 2 MG tablet  Commonly known as:  ESTRACE  Take 2 mg by  mouth daily before breakfast.     fluconazole 100 MG tablet  Commonly known as:  DIFLUCAN  Take 1 tablet (100 mg total) by mouth daily. Take for 4 days then stop.  Start taking on:  01/12/2013     furosemide 40 MG tablet  Commonly known as:  LASIX  Take 1 tablet (40 mg total) by mouth daily.     HYDROcodone-acetaminophen 5-325 MG per tablet  Commonly known as:  NORCO/VICODIN  Take 2 tablets by mouth every 4 (four) hours as needed.     levofloxacin 750 MG tablet  Commonly known as:  LEVAQUIN  Take 1 tablet (750 mg total) by mouth daily. Take for 3 days then stop.  Start taking on:  01/12/2013     loperamide 2 MG capsule  Commonly known as:  IMODIUM  Take 2 mg by mouth 4 (four) times daily as needed for diarrhea or loose stools.     loratadine-pseudoephedrine 5-120 MG per tablet  Commonly known as:  CLARITIN-D 12-hour  Take 1 tablet by mouth daily as needed for allergies.     ondansetron 4 MG tablet  Commonly known as:  ZOFRAN  Take 4 mg by mouth every 8 (eight) hours as needed for nausea.     promethazine 25 MG tablet  Commonly known as:  PHENERGAN  Take 25 mg by mouth every 6 (six) hours as needed for nausea.     silver nitrate applicators 75-25 % applicator  Apply 1 Stick topically every 5 (five) minutes x 3 doses as needed (Please send 4 doses now/Apply to bleeders in abd incision stat).     tiotropium 18 MCG inhalation capsule  Commonly known as:  SPIRIVA  Place 1 capsule (18 mcg total) into inhaler and inhale daily.       Allergies  Allergen Reactions  . Vancomycin Hives and Rash    ? wheezing  . Ativan [Lorazepam]     confusion  . Codeine Nausea And Vomiting  . Tetanus Toxoids Other (See Comments)    serum  . Penicillins Hives and Rash  . Xarelto [Rivaroxaban] Hives and Rash    ?   Follow-up Information   Follow up with Cassell Clement, MD On 01/14/2013. (needs BMET)    Specialty:  Cardiology   Contact information:   [address] [place] [address] [phone]        The results of significant diagnostics from this hospitalization (including imaging, microbiology, ancillary and laboratory) are listed below for reference.    Significant Diagnostic Studies: Dg Chest 2 View  01/04/2013   CLINICAL DATA:  Cough and congestion; fever  EXAM: CHEST  2 VIEW  COMPARISON:  December 22, 2012  FINDINGS: There is widespread reticulonodular interstitial disease throughout the mid and lower lung zones, essentially stable. A few areas of patchy airspace disease are seen in these areas as well. No new opacity is seen. Heart size and pulmonary vascularity are normal. No adenopathy. No bone lesions.  IMPRESSION: Extensive reticulonodular interstitial disease and patchy airspace disease in the mid and lower lung zones. Differential considerations include atypical infectious appearance, toxin/ medication reactive change, or atypical congestive heart failure. No appreciable change is noted compared to most recent prior study.   Electronically Signed   By: Bretta Bang   On: 01/04/2013 17:03   Dg Chest 2 View  12/22/2012   *RADIOLOGY REPORT*  Clinical Data: Evaluate pulmonary edema.  CHEST - 2 VIEW  Comparison: 12/20/2012  Findings: Normal cardiac silhouette.  There is bilateral fine air space disease which is improved compared to prior.  Small effusions are present.  No pneumothorax.  IMPRESSION: Improvement in bilateral air space disease.   Original Report Authenticated By: Genevive Bi, M.D.   Dg Chest Port 1 View  01/09/2013   CLINICAL DATA:  Acute respiratory failure. Sepsis.  EXAM: PORTABLE CHEST - 1 VIEW  COMPARISON:  Single view of the chest 01/07/2013.  FINDINGS: Small left pleural effusion and basilar airspace disease appear worsened. Patchy airspace disease in the right lower lung zone also appears increased. Heart size is normal. No pneumothorax is identified.  IMPRESSION: Increased patchy bilateral airspace disease and  small left effusion worrisome for pneumonia.   Electronically Signed   By: Drusilla Kanner M.D.   On: 01/09/2013 05:37   Dg Chest Port 1 View  01/07/2013   CLINICAL DATA:  Pneumonia, edema.  EXAM: PORTABLE CHEST - 1 VIEW  COMPARISON:  01/06/2013.  FINDINGS: Diffuse interstitial opacities are decreased. There is still diffuse reticular nodular densities. Small bilateral pleural effusions. Stable heart size. No pneumothorax.  IMPRESSION: 1. Pulmonary edema is improved since 1 day prior. 2. Persistent lung opacity may represent residual edema or pneumonia. 3. Small bilateral pleural effusions.   Electronically Signed   By: Tiburcio Pea   On: 01/07/2013 05:37   Dg Chest Port 1 View  01/06/2013   CLINICAL DATA:  Pulmonary edema  EXAM: PORTABLE CHEST - 1 VIEW  COMPARISON:  01/05/2013  FINDINGS: Diffuse interstitial/ airspace opacity bilaterally, reflecting mild to moderate interstitial edema, mildly increased. Possible trace left pleural effusion. No pneumothorax.  Cardiomegaly.  IMPRESSION: Cardiomegaly with mild to moderate interstitial edema, mildly increased.  Possible trace left pleural effusion.   Electronically Signed   By: Charline Bills M.D.   On: 01/06/2013 07:59   Dg Chest Port 1 View  01/05/2013   *RADIOLOGY REPORT*  Clinical Data: CHF, history of hypertension  PORTABLE CHEST - 1 VIEW  Comparison: Prior radiograph from 01/04/2013  Findings: Cardiomegaly is stable as compared to the prior examination.  The current examination has been performed with a similar degree of lung inflation.  There has been interval worsening of diffuse pulmonary vascular congestion and peribronchial cuffing, suggestive of worsening essentially edema. Small bilateral pleural effusions are present.  No pneumothorax.  No infiltrates identified.  No acute osseous abnormality.  IMPRESSION: 1.  Interval worsening of pulmonary vascular congestion and for with diffuse interstitial prominence and peribronchial cuffing,  suggestive of worsening atypical congestive heart failure. Possible atypical infection could also have this appearance. 2.  Small bilateral pleural effusions.   Original Report Authenticated By: Rise Mu, M.D.  Dg Chest Portable 1 View  12/20/2012   *RADIOLOGY REPORT*  Clinical Data: Weakness, shortness of breath  PORTABLE CHEST - 1 VIEW  Comparison: 05/29/2012  Findings: The cardiac shadow is stable.  The previously seen PICC line has been removed in the interval.  The lungs are well-aerated but demonstrate diffuse patchy changes consistent with pulmonary edema.  No focal confluent infiltrate is seen.  IMPRESSION: Increased vasculature with parenchymal changes consistent with pulmonary edema.   Original Report Authenticated By: Alcide Clever, M.D.    Microbiology: Recent Results (from the past 240 hour(s))  CULTURE, BLOOD (ROUTINE X 2)     Status: None   Collection Time    01/04/13  4:30 PM      Result Value Range Status   Specimen Description BLOOD RIGHT ARM   Final   Special Requests BOTTLES DRAWN AEROBIC AND ANAEROBIC 5CC   Final   Culture  Setup Time     Final   Value: 01/04/2013 20:24     Performed at [facility]   Culture     Final   Value: NO GROWTH 5 DAYS     Performed at [facility]   Report Status 01/10/2013 FINAL   Final  URINE CULTURE     Status: None   Collection Time    01/05/13  3:06 AM      Result Value Range Status   Specimen Description URINE, CATHETERIZED   Final   Special Requests Normal   Final   Culture  Setup Time     Final   Value: 01/05/2013 17:40     Performed at [facility] Count     Final   Value: NO GROWTH     Performed at [facility]   Culture     Final   Value: NO GROWTH     Performed at [facility]   Report Status 01/06/2013 FINAL   Final  CULTURE, EXPECTORATED SPUTUM-ASSESSMENT     Status: None   Collection Time    01/06/13  6:37 AM      Result Value Range Status   Specimen  Description SPU   Final   Special Requests Normal   Final   Sputum evaluation     Final   Value: MICROSCOPIC FINDINGS SUGGEST THAT THIS SPECIMEN IS NOT REPRESENTATIVE OF LOWER RESPIRATORY SECRETIONS. PLEASE RECOLLECT.     S RUSSELL AT 0718 ON 09.21.2014 BY NBROOKS   Report Status 01/06/2013 FINAL   Final  CULTURE, EXPECTORATED SPUTUM-ASSESSMENT     Status: None   Collection Time    01/06/13  8:25 AM      Result Value Range Status   Specimen Description SPUTUM   Final   Special Requests Normal   Final   Sputum evaluation     Final   Value: THIS SPECIMEN IS ACCEPTABLE. RESPIRATORY CULTURE REPORT TO FOLLOW.   Report Status 01/06/2013 FINAL   Final  CULTURE, RESPIRATORY (NON-EXPECTORATED)     Status: None   Collection Time    01/06/13  8:25 AM      Result Value Range Status   Specimen Description SPUTUM   Final   Special Requests NONE   Final   Gram Stain     Final   Value: RARE WBC PRESENT, PREDOMINANTLY PMN     RARE SQUAMOUS EPITHELIAL CELLS PRESENT     NO ORGANISMS SEEN     Performed at [facility]  Final   Value: FEW CANDIDA ALBICANS     Performed at [facility]   Report Status 01/08/2013 FINAL   Final     Labs: Basic Metabolic Panel:  Recent Labs Lab 01/07/13 0345 01/08/13 0410 01/09/13 0426 01/10/13 0445 01/11/13 0348  NA 126* 126* 127* 126* 125*  K 4.2 4.4 4.3 4.2 4.1  CL 97 95* 98 98 95*  CO2 17* 20 21 19 19   GLUCOSE 106* 93 102* 90 83  BUN 18 14 12 11 14   CREATININE 0.79 0.79 0.70 0.66 0.74  CALCIUM 6.7* 7.1* 7.4* 7.2* 7.3*   Liver Function Tests: No results found for this basename: AST, ALT, ALKPHOS, BILITOT, PROT, ALBUMIN,  in the last 168 hours No results found for this basename: LIPASE, AMYLASE,  in the last 168 hours No results found for this basename: AMMONIA,  in the last 168 hours CBC:  Recent Labs Lab 01/07/13 0345 01/08/13 0410 01/09/13 0426 01/10/13 0445 01/11/13 0348  WBC 4.7 3.7* 5.8 3.6* 3.7*  HGB 9.8*  9.4* 9.3* 9.3* 8.6*  HCT 28.1* 27.5* 28.5* 26.5* 25.2*  MCV 97.2 98.2 94.4 98.1 96.2  PLT 256 330 330 262 254   Cardiac Enzymes:  Recent Labs Lab 01/04/13 1455  TROPONINI <0.30   BNP: BNP (last 3 results)  Recent Labs  01/05/13 0345 01/09/13 0426 01/11/13 0830  PROBNP 5651.0* 6842.0* 5074.0*   CBG: No results found for this basename: GLUCAP,  in the last 168 hours     Signed:  Graycen Degan  [facility] 01/11/2013, 2:30 PM

## 2013-01-15 ENCOUNTER — Encounter (INDEPENDENT_AMBULATORY_CARE_PROVIDER_SITE_OTHER): Payer: Self-pay

## 2013-01-15 ENCOUNTER — Other Ambulatory Visit (INDEPENDENT_AMBULATORY_CARE_PROVIDER_SITE_OTHER): Payer: BC Managed Care – PPO

## 2013-01-15 DIAGNOSIS — D649 Anemia, unspecified: Secondary | ICD-10-CM

## 2013-01-15 DIAGNOSIS — Z79899 Other long term (current) drug therapy: Secondary | ICD-10-CM

## 2013-01-17 ENCOUNTER — Other Ambulatory Visit: Payer: BC Managed Care – PPO

## 2013-01-17 ENCOUNTER — Encounter: Payer: Self-pay | Admitting: Cardiology

## 2013-01-17 ENCOUNTER — Ambulatory Visit
Admission: RE | Admit: 2013-01-17 | Discharge: 2013-01-17 | Disposition: A | Discharge status: Home / Self Care | Payer: BC Managed Care – PPO | Source: Ambulatory Visit | Attending: Cardiology | Admitting: Cardiology

## 2013-01-17 ENCOUNTER — Ambulatory Visit (INDEPENDENT_AMBULATORY_CARE_PROVIDER_SITE_OTHER): Payer: BC Managed Care – PPO | Admitting: Cardiology

## 2013-01-17 VITALS — BP 129/81 | HR 93 | Ht 65.0 in | Wt 141.8 lb

## 2013-01-17 DIAGNOSIS — B370 Candidal stomatitis: Secondary | ICD-10-CM

## 2013-01-17 DIAGNOSIS — I480 Paroxysmal atrial fibrillation: Secondary | ICD-10-CM

## 2013-01-17 DIAGNOSIS — D649 Anemia, unspecified: Secondary | ICD-10-CM

## 2013-01-17 DIAGNOSIS — J189 Pneumonia, unspecified organism: Secondary | ICD-10-CM

## 2013-01-17 DIAGNOSIS — D62 Acute posthemorrhagic anemia: Secondary | ICD-10-CM

## 2013-01-17 DIAGNOSIS — I119 Hypertensive heart disease without heart failure: Secondary | ICD-10-CM

## 2013-01-17 DIAGNOSIS — E871 Hypo-osmolality and hyponatremia: Secondary | ICD-10-CM

## 2013-01-17 DIAGNOSIS — I4891 Unspecified atrial fibrillation: Secondary | ICD-10-CM

## 2013-01-17 LAB — CBC WITH DIFFERENTIAL/PLATELET
HCT: 24.9 % — ABNORMAL LOW (ref 36.0–46.0)
Hemoglobin: 8.4 g/dL — ABNORMAL LOW (ref 12.0–15.0)
MCHC: 34 g/dL (ref 30.0–36.0)
Platelets: 256 10*3/uL (ref 150.0–400.0)
RDW: 17 % — ABNORMAL HIGH (ref 11.5–14.6)

## 2013-01-17 LAB — BASIC METABOLIC PANEL
Calcium: 7.2 mg/dL — ABNORMAL LOW (ref 8.4–10.5)
Creatinine, Ser: 0.8 mg/dL (ref 0.4–1.2)
GFR: 74.67 mL/min (ref >60.00)
Glucose, Bld: 98 mg/dL (ref 70–99)
Potassium: 4 mEq/L (ref 3.5–5.1)
Sodium: 131 mEq/L — ABNORMAL LOW (ref 135–145)

## 2013-01-17 MED ORDER — FLUCONAZOLE 200 MG PO TABS: 200.0000 mg | ORAL_TABLET | Freq: Every day | ORAL | Status: DC

## 2013-01-17 NOTE — Assessment & Plan Note (Signed)
She is not running any fever at home.  She still has a cough.  Unfortunately she is smoking a few cigarettes at home.  Her last chest x-ray showed a patch of right lower lobe pneumonia.  We will update her x-ray today

## 2013-01-17 NOTE — Progress Notes (Signed)
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 In early 2013 she was hospitalized on the medical service after presenting with sudden onset of severe diarrhea associated with syncope and hypotension. During the course of that hospitalization she underwent colonoscopy which showed ischemic colitis. Also during that hospital stay she also had episodes of paroxysmal atrial fibrillation which responded to beta blocker. During that hospital stay she had an echocardiogram showing normal LV systolic function and normal left atrial size. Because of her past history of anemia and multiple comorbidities she was not placed on warfarin. She has a past history of small bowel obstruction. Her surgeon is Dr. Abbey Chatters.  She has multiple medical issues which are as outlined below. These include PAF, ischemic colitis, depression, HTN, and tobacco abuse. She has a normal EF per echo from earlier this year.  In September 2013 she was admitted at [facility] with another attack of ischemic colitis. She ended up having colon surgery with a colostomy placed. She had PAF while hospitalized. Also with MRSA from a PICC line.  She subsequently had exploratory abdominal surgery here in [place] and  had a prolonged post hospital course.  Dr. Abbey Chatters is her surgeon  During her hospitalization the patient had paroxysmal atrial fibrillation with rapid ventricular response.  She has had several recent admissions to [facility].  She was hospitalized a month ago with severe anemia and a hemoglobin of 7 and required transfusion.  Apparently it was felt that the anemia was secondary to chronic blood loss around her stoma.  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 She was readmitted a week  ago with weakness and suspected right lower lobe pneumonia.  She had paroxysmal atrial fibrillation during that hospitalization.  She also had problems with hyponatremia and difficulties with fluid balance.  She eventually insisted on being discharged a week ago.  Since going home she has remained quite weak but states that her pulse rate has been regular and not erratic.  She is no longer on antibiotics because she has developed a very sore mouth with candidiasis and has been on Diflucan. She has a history of chronic myelomonocytic leukemia.  She missed her last oncology appointment because she was in the hospital and she has not met her new oncologist yet.  Dr. Gaylyn Rong had been her oncologist.  Current Outpatient Prescriptions  Medication Sig Dispense Refill  . aspirin 325 MG EC tablet Take 325 mg by mouth daily.      . bisoprolol (ZEBETA) 5 MG tablet Take 1 tablet (5 mg total) by mouth daily before breakfast.  90 tablet  3  . celecoxib (CELEBREX) 200 MG capsule Take 200 mg by mouth daily.      . diphenoxylate-atropine (LOMOTIL) 2.5-0.025 MG per tablet Take 1 tablet by mouth as needed.       Marland Kitchen esomeprazole (NEXIUM) 40 MG capsule Take 40 mg by mouth every morning.       Marland Kitchen estradiol (ESTRACE) 2 MG tablet Take 2 mg by mouth daily before breakfast.       . HYDROcodone-acetaminophen (NORCO/VICODIN) 5-325 MG per tablet Take 2 tablets by mouth every 4 (four) hours as needed.  30 tablet  0  . loperamide (IMODIUM) 2 MG capsule  Take 2 mg by mouth 4 (four) times daily as needed for diarrhea or loose stools.      Marland Kitchen loratadine-pseudoephedrine (CLARITIN-D 12-HOUR) 5-120 MG per tablet Take 1 tablet by mouth daily as needed for allergies.       Marland Kitchen ondansetron (ZOFRAN) 4 MG tablet Take 4 mg by mouth every 8 (eight) hours as needed for nausea.      . promethazine (PHENERGAN) 25 MG tablet Take 25 mg by mouth every 6 (six) hours as needed for nausea.      . silver nitrate applicators 75-25 % applicator Apply 1 Stick  topically every 5 (five) minutes x 3 doses as needed (Please send 4 doses now/Apply to bleeders in abd incision stat).  100 each  0  . tiotropium (SPIRIVA) 18 MCG inhalation capsule Place 1 capsule (18 mcg total) into inhaler and inhale daily.  30 capsule  0  . albuterol (PROVENTIL HFA;VENTOLIN HFA) 108 (90 BASE) MCG/ACT inhaler Inhale 1-2 puffs into the lungs every 6 (six) hours as needed for wheezing.      Marland Kitchen buPROPion (WELLBUTRIN XL) 300 MG 24 hr tablet Take 300 mg by mouth daily before breakfast.       . fluconazole (DIFLUCAN) 200 MG tablet Take 1 tablet (200 mg total) by mouth daily.  4 tablet  0   No current facility-administered medications for this visit.    Allergies  Allergen Reactions  . Vancomycin Hives and Rash    ? wheezing  . Ativan [Lorazepam]     confusion  . Codeine Nausea And Vomiting  . Tetanus Toxoids Other (See Comments)    serum  . Penicillins Hives and Rash  . Xarelto [Rivaroxaban] Hives and Rash    ?    Patient Active Problem List   Diagnosis Date Noted  . HCAP (healthcare-associated pneumonia) 01/11/2013  . Obstructive chronic bronchitis with exacerbation 01/06/2013  . Severe sepsis 01/04/2013  . Acute respiratory failure 12/20/2012  . Cough 11/19/2012  . Hypocalcemia 07/17/2012  . Hypomagnesemia 06/27/2012  . Open abdominal wall wound 06/20/2012  . Ileostomy status 06/20/2012  . Elevated liver function tests-chronic 05/17/2012  . Ischemic colitis 01/31/2012  . 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 Osteoarthritis of hip 10/27/2011  . Postoperative anemia due to acute blood loss 10/27/2011  . Chronic hyponatremia 10/27/2011  . WBC disease 10/27/2011  . Ileus 10/27/2011  . Partial small bowel obstruction 07/05/2011  . Atrial fibrillation 07/05/2011  . Chronic ischemic colitis 07/05/2011  . Hypotension 07/05/2011    Class: Acute  . Atrial fibrillation with rapid ventricular response 05/09/2011  . Chronic back pain 05/08/2011  .  Diarrhea 05/07/2011  . Dehydration 05/07/2011  . Benign hypertensive heart disease without heart failure 12/15/2010  . Pure hypercholesterolemia 12/15/2010  . Anemia 12/15/2010    History  Smoking status  . Current Some Day Smoker -- 0.25 packs/day for 20 years  . Types: Cigarettes  Smokeless tobacco  . Never Used    History  Alcohol Use  . Yes    Comment: 1 - 2 glasses of wine per week    Family History  Problem Relation Age of Onset  . Stroke Father   . Atrial fibrillation Father   . Hypertension Mother     Review of Systems: Constitutional: no fever chills diaphoresis or fatigue or change in weight.  Head and neck: no hearing loss, no epistaxis, no photophobia or visual disturbance. Respiratory: No cough, shortness of breath or wheezing. Cardiovascular: No chest pain peripheral  edema, palpitations. Gastrointestinal: No abdominal distention, no abdominal pain, no change in bowel habits hematochezia or melena. Genitourinary: No dysuria, no frequency, no urgency, no nocturia. Musculoskeletal:No arthralgias, no back pain, no gait disturbance or myalgias. Neurological: No dizziness, no headaches, no numbness, no seizures, no syncope, no weakness, no tremors. Hematologic: No lymphadenopathy, no easy bruising. Psychiatric: No confusion, no hallucinations, no sleep disturbance.    Physical Exam: Filed Vitals:   01/17/13 1222  BP: 129/81  Pulse: 93   the general appearance reveals a chronically ill middle-age woman in no distress.The head and neck exam reveals pupils equal and reactive.  Extraocular movements are full.  There is no scleral icterus.  The mouth reveals changes are candidiasis.  The neck is supple.  The carotids reveal no bruits.  The jugular venous pressure is normal.  The  thyroid is not enlarged.  There is no lymphadenopathy.  The chest  reveals scattered rhonchi bilaterally but worse on the right base.  There is no dullness to percussion.  Expansion of the  chest is symmetrical.  The precordium is quiet.  The first heart sound is normal.  The second heart sound is physiologically split.  There is no murmur gallop rub or click.  There is no abnormal lift or heave.  The abdomen is soft and nontender.  There is an ileostomy in the right lower abdomen.  There is a fistula in the midline of the abdomen which has a dressing which was not examined.  The bowel sounds are normal.  The liver and spleen are not enlarged.  There are no abdominal masses.  There are no abdominal bruits.  There is an ileostomy present.  Extremities reveal weak pedal pulses.  There is no phlebitis or edema.  There is acrocyanosis and coolness of her feet bilaterally. Strength is normal and symmetrical in all extremities.  There is no lateralizing weakness.  There are no sensory deficits.  The skin is warm and dry.  There is no rash.     Assessment / Plan: Continue on same medication.  Advised to stop smoking altogether.  She will get a chest x-ray today.  We will also get CBC, basal metabolic panel, and hepatic function panel today.  We will give her an additional 4 days of Diflucan 200 mg one daily. She is requesting pain medication but I would like her to get that from her primary care physician who is in Dr. Hadley Pen practice. Recheck here in one month for followup office visit

## 2013-01-17 NOTE — Patient Instructions (Addendum)
Will obtain labs today and call you with the results (cbc/bmet/hfp)  Will have you go for a chest xray at [facility]   Will send a Rx for Diflucan 200 mg 1 daily   Your physician recommends that you schedule a follow-up appointment in: 4 weeks

## 2013-01-17 NOTE — Assessment & Plan Note (Signed)
She has not been taking any Lasix at home.  She does not appear to be fluid overloaded.  Her blood pressure is normal on current therapy.

## 2013-01-17 NOTE — Assessment & Plan Note (Signed)
We're checking a CBC today

## 2013-01-17 NOTE — Assessment & Plan Note (Signed)
The patient is in normal sinus rhythm clinically today.  We did not do an EKG.  The patient has a past history of paroxysmal rapid atrial fib.  She is not on Coumadin because of comorbidities

## 2013-01-18 ENCOUNTER — Telehealth: Payer: Self-pay | Admitting: *Deleted

## 2013-01-18 NOTE — Telephone Encounter (Signed)
Message copied by Burnell Blanks on Fri Jan 18, 2013  5:58 PM ------      Message from: Cassell Clement      Created: Thu Jan 17, 2013  8:37 PM       Please report.  The serum sodium is  131 the best it has been in a long time. Potassium normal. Kidney function is normal. Hgb is still low 8.4. Would take multivitamin daily and get appointment soon with her new hematologist to evaluate anemia further. ------

## 2013-01-18 NOTE — Telephone Encounter (Signed)
Message copied by Burnell Blanks on Fri Jan 18, 2013  5:58 PM ------      Message from: Cassell Clement      Created: Thu Jan 17, 2013  8:40 PM       The chest xray is better on left and about the same on right which radiologist wonders may be chronic lung changes. No need for anymore antibiotics now. CSD ------

## 2013-01-18 NOTE — Telephone Encounter (Signed)
Advised patient of lab results and chest xray. She had appointment last week that she cancelled secondary to being in the hospital with hematologist, she will call them.

## 2013-01-22 ENCOUNTER — Telehealth: Payer: Self-pay | Admitting: Cardiology

## 2013-01-22 ENCOUNTER — Telehealth: Payer: Self-pay | Admitting: Hematology and Oncology

## 2013-01-22 NOTE — Telephone Encounter (Signed)
Pt's sister called today for appt. R/s 9/23 lb/NG to 10/14 @ 1:15pm. Pt sister aware of new d/t.

## 2013-01-22 NOTE — Telephone Encounter (Signed)
I do not have any other suggestions for her. She may want to have her PCP Dr. Trey Paula Hooper's partner refer her to an ENT. Or she may need to go back to the ER and have them check her.

## 2013-01-22 NOTE — Telephone Encounter (Signed)
Has used Diflucan and mouth so sore is having a hard time drinking. Sore throat and laryngitis now. Was getting Xopanex treatments in hospital. Still so congested. Eating ice chips. Very concerned about lack of fluid. Patient states she is feeling worse than when she left hospital. Will forward to  Dr. Patty Sermons for recommendations.

## 2013-01-22 NOTE — Telephone Encounter (Signed)
Advised patient

## 2013-01-22 NOTE — Telephone Encounter (Signed)
Follow UP:  [name] states she is returning [name]'s call

## 2013-01-22 NOTE — Telephone Encounter (Signed)
New Problem  Pt's sister wants to give an update/// she is Dehydrated, having trouble getting fluids down, congested and sore throat w/ extreme fatigue. Has requested all a call back to discuss.

## 2013-01-28 ENCOUNTER — Other Ambulatory Visit: Payer: Self-pay | Admitting: Hematology and Oncology

## 2013-01-28 DIAGNOSIS — C9310 Chronic myelomonocytic leukemia not having achieved remission: Secondary | ICD-10-CM

## 2013-01-28 DIAGNOSIS — D630 Anemia in neoplastic disease: Secondary | ICD-10-CM | POA: Insufficient documentation

## 2013-01-28 DIAGNOSIS — D638 Anemia in other chronic diseases classified elsewhere: Secondary | ICD-10-CM

## 2013-01-29 ENCOUNTER — Ambulatory Visit (HOSPITAL_BASED_OUTPATIENT_CLINIC_OR_DEPARTMENT_OTHER): Payer: BC Managed Care – PPO | Admitting: Hematology and Oncology

## 2013-01-29 ENCOUNTER — Encounter: Payer: Self-pay | Admitting: Hematology and Oncology

## 2013-01-29 ENCOUNTER — Ambulatory Visit (HOSPITAL_BASED_OUTPATIENT_CLINIC_OR_DEPARTMENT_OTHER): Payer: BC Managed Care – PPO

## 2013-01-29 ENCOUNTER — Other Ambulatory Visit (HOSPITAL_BASED_OUTPATIENT_CLINIC_OR_DEPARTMENT_OTHER): Payer: BC Managed Care – PPO | Admitting: Lab

## 2013-01-29 ENCOUNTER — Encounter (INDEPENDENT_AMBULATORY_CARE_PROVIDER_SITE_OTHER): Payer: Self-pay

## 2013-01-29 VITALS — BP 93/58 | HR 95 | Temp 97.5°F | Resp 20 | Ht 65.0 in | Wt 138.6 lb

## 2013-01-29 DIAGNOSIS — E538 Deficiency of other specified B group vitamins: Secondary | ICD-10-CM

## 2013-01-29 DIAGNOSIS — D638 Anemia in other chronic diseases classified elsewhere: Secondary | ICD-10-CM

## 2013-01-29 DIAGNOSIS — E860 Dehydration: Secondary | ICD-10-CM

## 2013-01-29 DIAGNOSIS — C9310 Chronic myelomonocytic leukemia not having achieved remission: Secondary | ICD-10-CM

## 2013-01-29 DIAGNOSIS — K121 Other forms of stomatitis: Secondary | ICD-10-CM

## 2013-01-29 DIAGNOSIS — D72821 Monocytosis (symptomatic): Secondary | ICD-10-CM

## 2013-01-29 LAB — IRON AND TIBC CHCC
TIBC: 296 ug/dL (ref 236–444)
UIBC: 227 ug/dL (ref 120–384)

## 2013-01-29 LAB — CBC & DIFF AND RETIC
BASO%: 0.2 % (ref 0.0–2.0)
EOS%: 8 % — ABNORMAL HIGH (ref 0.0–7.0)
HGB: 9.7 g/dL — ABNORMAL LOW (ref 11.6–15.9)
LYMPH%: 20.3 % (ref 14.0–49.7)
MCH: 31.1 pg (ref 25.1–34.0)
MCHC: 32.3 g/dL (ref 31.5–36.0)
MCV: 96.2 fL (ref 79.5–101.0)
MONO%: 17.3 % — ABNORMAL HIGH (ref 0.0–14.0)
NEUT%: 54.2 % (ref 38.4–76.8)
Platelets: 401 10*3/uL — ABNORMAL HIGH (ref 145–400)
RBC: 3.12 10*6/uL — ABNORMAL LOW (ref 3.70–5.45)
RDW: 17.5 % — ABNORMAL HIGH (ref 11.2–14.5)
Retic %: 3.18 % — ABNORMAL HIGH (ref 0.70–2.10)

## 2013-01-29 LAB — MORPHOLOGY
PLT EST: INCREASED
RBC Comments: NORMAL

## 2013-01-29 LAB — FERRITIN CHCC: Ferritin: 215 ng/ml (ref 9–269)

## 2013-01-29 LAB — VITAMIN B12: Vitamin B-12: 356 pg/mL (ref 211–911)

## 2013-01-29 MED ORDER — MAGIC MOUTHWASH W/LIDOCAINE: 5.0000 mL | Freq: Four times a day (QID) | ORAL | Status: DC

## 2013-01-29 MED ORDER — SODIUM CHLORIDE 0.9 % IV SOLN
Freq: Every day | INTRAVENOUS | Status: DC
  Administered 2013-01-29: 16:00:00 via INTRAVENOUS

## 2013-01-29 NOTE — Progress Notes (Signed)
1630 Dr. Bertis Ruddy notified that patient is c/o throat burning from mucositis while trying to eat in infusion room. MD states she has had conversations with patient and family members during office visit about hesitation to give magic mouthwash with lidocaine d/t patient having ileostomy and abd wounds. Pt states she understands this but says she will only swish not swallow the magic mouthwash. rx for magic mouthwash w/ lidocaine given to patient with emphasis on not swallowing this medication. Patient verbalizes understanding.

## 2013-01-29 NOTE — Patient Instructions (Signed)
Dehydration, Adult Dehydration is when you lose more fluids from the body than you take in. Vital organs like the kidneys, brain, and heart cannot function without a proper amount of fluids and salt. Any loss of fluids from the body can cause dehydration.  CAUSES   Vomiting.  Diarrhea.  Excessive sweating.  Excessive urine output.  Fever. SYMPTOMS  Mild dehydration  Thirst.  Dry lips.  Slightly dry mouth. Moderate dehydration  Very dry mouth.  Sunken eyes.  Skin does not bounce back quickly when lightly pinched and released.  Dark urine and decreased urine production.  Decreased tear production.  Headache. Severe dehydration  Very dry mouth.  Extreme thirst.  Rapid, weak pulse (more than 100 beats per minute at rest).  Cold hands and feet.  Not able to sweat in spite of heat and temperature.  Rapid breathing.  Blue lips.  Confusion and lethargy.  Difficulty being awakened.  Minimal urine production.  No tears. DIAGNOSIS  Your caregiver will diagnose dehydration based on your symptoms and your exam. Blood and urine tests will help confirm the diagnosis. The diagnostic evaluation should also identify the cause of dehydration. TREATMENT  Treatment of mild or moderate dehydration can often be done at home by increasing the amount of fluids that you drink. It is best to drink small amounts of fluid more often. Drinking too much at one time can make vomiting worse. Refer to the home care instructions below. Severe dehydration needs to be treated at the hospital where you will probably be given intravenous (IV) fluids that contain water and electrolytes. HOME CARE INSTRUCTIONS   Ask your caregiver about specific rehydration instructions.  Drink enough fluids to keep your urine clear or pale yellow.  Drink small amounts frequently if you have nausea and vomiting.  Eat as you normally do.  Avoid:  Foods or drinks high in sugar.  Carbonated  drinks.  Juice.  Extremely hot or cold fluids.  Drinks with caffeine.  Fatty, greasy foods.  Alcohol.  Tobacco.  Overeating.  Gelatin desserts.  Wash your hands well to avoid spreading bacteria and viruses.  Only take over-the-counter or prescription medicines for pain, discomfort, or fever as directed by your caregiver.  Ask your caregiver if you should continue all prescribed and over-the-counter medicines.  Keep all follow-up appointments with your caregiver. SEEK MEDICAL CARE IF:  You have abdominal pain and it increases or stays in one area (localizes).  You have a rash, stiff neck, or severe headache.  You are irritable, sleepy, or difficult to awaken.  You are weak, dizzy, or extremely thirsty. SEEK IMMEDIATE MEDICAL CARE IF:   You are unable to keep fluids down or you get worse despite treatment.  You have frequent episodes of vomiting or diarrhea.  You have blood or green matter (bile) in your vomit.  You have blood in your stool or your stool looks black and tarry.  You have not urinated in 6 to 8 hours, or you have only urinated a small amount of very dark urine.  You have a fever.  You faint. MAKE SURE YOU:   Understand these instructions.  Will watch your condition.  Will get help right away if you are not doing well or get worse. Document Released: 04/04/2005 Document Revised: 06/27/2011 Document Reviewed: 11/22/2010 [employer] Patient Information 2014 [employer].  

## 2013-01-30 ENCOUNTER — Ambulatory Visit (HOSPITAL_BASED_OUTPATIENT_CLINIC_OR_DEPARTMENT_OTHER): Payer: BC Managed Care – PPO

## 2013-01-30 ENCOUNTER — Telehealth: Payer: Self-pay | Admitting: Hematology and Oncology

## 2013-01-30 VITALS — BP 129/72 | HR 97 | Temp 98.6°F

## 2013-01-30 DIAGNOSIS — E860 Dehydration: Secondary | ICD-10-CM

## 2013-01-30 MED ORDER — SODIUM CHLORIDE 0.9 % IV SOLN
INTRAVENOUS | Status: DC
  Administered 2013-01-30: 14:00:00 via INTRAVENOUS

## 2013-01-30 MED ORDER — SODIUM CHLORIDE 0.9 % IV SOLN
4.0000 mg | Freq: Once | INTRAVENOUS | Status: AC
  Administered 2013-01-30: 4 mg via INTRAVENOUS
  Filled 2013-01-30: qty 2

## 2013-01-30 NOTE — Telephone Encounter (Signed)
lvm for pt regarding to all OCT and NOV appts.Marland KitchenMarland Kitchenpt will get updated sched today

## 2013-01-30 NOTE — Progress Notes (Signed)
[facility] OFFICE PROGRESS NOTE  Patient Care Team: Cassell Clement, MD as PCP - General (Cardiology) Vertell Novak., MD (Gastroenterology) Exie Parody, MD (Hematology and Oncology) Valeria Batman, MD (Orthopedic Surgery) Adolph Pollack, MD as Attending Physician (General Surgery)  DIAGNOSIS: CMML, chronic anemia, history of B12 deficiency, for further management  SUMMARY OF ONCOLOGIC HISTORY: I reviewed her records and collaborated the history with the patient. The patient had recurrent ischemic colitis in 2013. She has significant surgery, sepsis, recurrent hospitalization including intensive care, recurrent blood transfusion, finally was referred to see a hematologist for evaluation. She had extensive workup and subsequently was found to have B12 deficiency along with the diagnosis of CMML. She had a bone marrow aspirate and biopsy which was not grossly abnormal but suggestive of possible diagnosis of CMML due to mild dyspoiesis with mildly elevated monocyte count. She was being observed.  INTERVAL HISTORY: [name] 62 y.o. female returns for further followup regarding her diagnosis of CMML. She had a chronic nonhealing wound for many months and to have persistent oozing around her abdomen wound. It subsequently healed but she was so she required some form of plastic surgery with flap in the near future. She has not received any B12 injection for long time. She is to have profound fatigue. With the ileostomy, she had high output diarrhea and she felt consistently dehydrated with persistent feeling of Thursday and occasional dizziness. She also had recurrent bouts of infection and have been on 2 courses of antibiotic and antifungal treatment. She complained of persistent mucositis and swallowing difficulties and difficulties maintaining her weight because of that. On top of that, she continued to smoke. She say she is attempting to quit. Between last year to this  year, she is to smoking 4-5 cigarettes a day. She denies any fevers, chills, productive cough, or night sweats. No other forms of bleeding such as epistaxis, hematuria, or hematochezia.  I have reviewed the past medical history, past surgical history, social history and family history with the patient and they are unchanged from previous note.  ALLERGIES:  is allergic to vancomycin; ativan; codeine; tetanus toxoids; penicillins; and xarelto.  MEDICATIONS:  Current Outpatient Prescriptions  Medication Sig Dispense Refill  . aspirin 325 MG EC tablet Take 325 mg by mouth daily.      . bisoprolol (ZEBETA) 5 MG tablet Take 1 tablet (5 mg total) by mouth daily before breakfast.  90 tablet  3  . buPROPion (WELLBUTRIN XL) 300 MG 24 hr tablet Take 300 mg by mouth daily before breakfast.       . celecoxib (CELEBREX) 200 MG capsule Take 200 mg by mouth daily.      . diphenoxylate-atropine (LOMOTIL) 2.5-0.025 MG per tablet Take 1 tablet by mouth as needed.       Marland Kitchen esomeprazole (NEXIUM) 40 MG capsule Take 40 mg by mouth every morning.       Marland Kitchen estradiol (ESTRACE) 2 MG tablet Take 2 mg by mouth daily before breakfast.       . furosemide (LASIX) 40 MG tablet       . HYDROcodone-acetaminophen (NORCO/VICODIN) 5-325 MG per tablet Take 2 tablets by mouth every 4 (four) hours as needed.  30 tablet  0  . loratadine-pseudoephedrine (CLARITIN-D 12-HOUR) 5-120 MG per tablet Take 1 tablet by mouth daily as needed for allergies.       Marland Kitchen ondansetron (ZOFRAN) 4 MG tablet Take 4 mg by mouth every 8 (eight) hours as  needed for nausea.      Marland Kitchen tiotropium (SPIRIVA) 18 MCG inhalation capsule Place 1 capsule (18 mcg total) into inhaler and inhale daily.  30 capsule  0  . albuterol (PROVENTIL HFA;VENTOLIN HFA) 108 (90 BASE) MCG/ACT inhaler Inhale 1-2 puffs into the lungs every 6 (six) hours as needed for wheezing.      . Alum & Mag Hydroxide-Simeth (MAGIC MOUTHWASH W/LIDOCAINE) SOLN Take 5 mLs by mouth 4 (four) times daily.  120  mL  0  . fluconazole (DIFLUCAN) 200 MG tablet Take 1 tablet (200 mg total) by mouth daily.  4 tablet  0  . loperamide (IMODIUM) 2 MG capsule Take 2 mg by mouth 4 (four) times daily as needed for diarrhea or loose stools.      . promethazine (PHENERGAN) 25 MG tablet Take 25 mg by mouth every 6 (six) hours as needed for nausea.      . silver nitrate applicators 75-25 % applicator Apply 1 Stick topically every 5 (five) minutes x 3 doses as needed (Please send 4 doses now/Apply to bleeders in abd incision stat).  100 each  0   Current Facility-Administered Medications  Medication Dose Route Frequency Provider Last Rate Last Dose  . 0.9 %  sodium chloride infusion   Intravenous Daily Artis Delay, MD        REVIEW OF SYSTEMS:   Eyes: Denies blurriness of vision Respiratory: Denies cough, dyspnea or wheezes Cardiovascular: Denies palpitation, chest discomfort or lower extremity swelling Skin: Denies abnormal skin rashes Lymphatics: Denies new lymphadenopathy or easy bruising Neurological:Denies numbness, tingling or new weaknesses Behavioral/Psych: Mood is stable, no new changes  All other systems were reviewed with the patient and are negative.  PHYSICAL EXAMINATION: ECOG PERFORMANCE STATUS: 1 - Symptomatic but completely ambulatory  Filed Vitals:   01/29/13 1352  BP: 93/58  Pulse: 95  Temp: 97.5 F (36.4 C)  Resp: 20   Filed Weights   01/29/13 1352  Weight: 138 lb 9.6 oz (62.869 kg)    GENERAL:alert, no distress and comfortable she looks thin but non-cachectic SKIN: skin color, texture, turgor are normal, no rashes or significant lesions EYES: normal, Conjunctiva are pink and non-injected, sclera clear OROPHARYNX:no exudate, no erythema and lips, buccal mucosa, and tongue normal no oral thrush is noted NECK: supple, thyroid normal size, non-tender, without nodularity LYMPH:  no palpable lymphadenopathy in the cervical, axillary or inguinal LUNGS: clear to auscultation and  percussion with normal breathing effort HEART: regular rate & rhythm and no murmurs and no lower extremity edema ABDOMEN:abdomen soft, non-tender and normal bowel sounds ileostomy site looks okay she has a bandage at the left side of her abdomen with no signs of oozing Musculoskeletal:no cyanosis of digits and no clubbing  NEURO: alert & oriented x 3 with fluent speech, no focal motor/sensory deficits  LABORATORY DATA:  I have reviewed the data as listed    Component Value Date/Time   NA 131* 01/15/2013 1626   NA 130* 02/09/2012 1321   K 4.0 01/15/2013 1626   K 4.6 02/09/2012 1321   CL 105 01/15/2013 1626   CL 101 02/09/2012 1321   CO2 15* 01/15/2013 1626   CO2 18* 02/09/2012 1321   GLUCOSE 98 01/15/2013 1626   GLUCOSE 79 02/09/2012 1321   BUN 7 01/15/2013 1626   BUN 21.0 02/09/2012 1321   CREATININE 0.8 01/15/2013 1626   CREATININE 0.92 08/10/2012 1528   CREATININE 0.8 02/09/2012 1321   CALCIUM 7.2* 01/15/2013 1626   CALCIUM  9.2 02/09/2012 1321   PROT 5.8* 05/31/2012 0615   PROT 7.0 02/09/2012 1321   ALBUMIN 3.5 07/16/2012 0458   ALBUMIN 3.8 02/09/2012 1321   AST 30 05/31/2012 0615   AST 53* 02/09/2012 1321   ALT 23 05/31/2012 0615   ALT 56* 02/09/2012 1321   ALKPHOS 456* 05/31/2012 0615   ALKPHOS 712* 02/09/2012 1321   BILITOT 0.3 05/31/2012 0615   BILITOT 0.50 02/09/2012 1321   GFRNONAA 89* 01/11/2013 0348   GFRAA >90 01/11/2013 0348    No results found for this basename: SPEP, UPEP,  kappa and lambda light chains    Lab Results  Component Value Date   WBC 6.0 01/29/2013   NEUTROABS 3.3 01/29/2013   HGB 9.7* 01/29/2013   HCT 30.0* 01/29/2013   MCV 96.2 01/29/2013   PLT 401* 01/29/2013      Chemistry      Component Value Date/Time   NA 131* 01/15/2013 1626   NA 130* 02/09/2012 1321   K 4.0 01/15/2013 1626   K 4.6 02/09/2012 1321   CL 105 01/15/2013 1626   CL 101 02/09/2012 1321   CO2 15* 01/15/2013 1626   CO2 18* 02/09/2012 1321   BUN 7 01/15/2013 1626   BUN 21.0  02/09/2012 1321   CREATININE 0.8 01/15/2013 1626   CREATININE 0.92 08/10/2012 1528   CREATININE 0.8 02/09/2012 1321      Component Value Date/Time   CALCIUM 7.2* 01/15/2013 1626   CALCIUM 9.2 02/09/2012 1321   ALKPHOS 456* 05/31/2012 0615   ALKPHOS 712* 02/09/2012 1321   AST 30 05/31/2012 0615   AST 53* 02/09/2012 1321   ALT 23 05/31/2012 0615   ALT 56* 02/09/2012 1321   BILITOT 0.3 05/31/2012 0615   BILITOT 0.50 02/09/2012 1321     ASSESSMENT:  Possible diagnosis of CMML, chronic anemia, history of B12 deficiency, dehydration and mucositis  PLAN:  #1 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 The bone marrow biopsy was done in this setting when she was on well. Sometime monocytosis is seen in the bone marrow in the setting of chronic infection. The mild dyspoiesis could be related to her history of B12 deficiency. I think we will have to revisit this diagnosis in the near future. #2 chronic anemia This is anemia chronic disease. She also has history of B12 deficiency. She extensive surgery, infection, and chronic oozing from her wound. We will continue aggressive supportive care and transfuse as needed. #3 history of B12 deficiency Plan recheck in B12 level. Will continue oral high-dose B12 supplements for now. If her B12 level falls under 200, we will resume B12 injections. #4 dehydration The patient is requesting to help because of high output ileostomy and her inability to maintain oral intake. I recommend IV fluids daily through the week and she is in agreement. #5 mucositis We will prescribe some Magic mouthwash with lidocaine to help with hier pain #6 Poor wound healing We had a long discussion about this. Her chronic smoking would not help with wound healing. I do not think that the anemia is the major contributing factor to her poor wound healing, certainly not the CMML. We continue aggressive supportive care and education about this. #7 chronic smoking Again I  spent a lot of time educating the patient about the importance of nicotine cessation. The patient does not want help and is willing to quit on her own.  Orders Placed This Encounter  Procedures  . CBC with Differential  Standing Status: Future     Number of Occurrences:      Standing Expiration Date: 10/21/2013  . Comprehensive metabolic panel    Standing Status: Future     Number of Occurrences:      Standing Expiration Date: 01/29/2014   All questions were answered. The patient knows to call the clinic with any problems, questions or concerns. No barriers to learning was detected. I spent 40 minutes counseling the patient face to face. The total time spent in the appointment was 60 minutes and more than 50% was on counseling and review of test results     [facility], Ehren Berisha, MD 01/30/2013 8:42 AM

## 2013-01-30 NOTE — Patient Instructions (Signed)
Dehydration, Adult Dehydration is when you lose more fluids from the body than you take in. Vital organs like the kidneys, brain, and heart cannot function without a proper amount of fluids and salt. Any loss of fluids from the body can cause dehydration.  CAUSES   Vomiting.  Diarrhea.  Excessive sweating.  Excessive urine output.  Fever. SYMPTOMS  Mild dehydration  Thirst.  Dry lips.  Slightly dry mouth. Moderate dehydration  Very dry mouth.  Sunken eyes.  Skin does not bounce back quickly when lightly pinched and released.  Dark urine and decreased urine production.  Decreased tear production.  Headache. Severe dehydration  Very dry mouth.  Extreme thirst.  Rapid, weak pulse (more than 100 beats per minute at rest).  Cold hands and feet.  Not able to sweat in spite of heat and temperature.  Rapid breathing.  Blue lips.  Confusion and lethargy.  Difficulty being awakened.  Minimal urine production.  No tears. DIAGNOSIS  Your caregiver will diagnose dehydration based on your symptoms and your exam. Blood and urine tests will help confirm the diagnosis. The diagnostic evaluation should also identify the cause of dehydration. TREATMENT  Treatment of mild or moderate dehydration can often be done at home by increasing the amount of fluids that you drink. It is best to drink small amounts of fluid more often. Drinking too much at one time can make vomiting worse. Refer to the home care instructions below. Severe dehydration needs to be treated at the hospital where you will probably be given intravenous (IV) fluids that contain water and electrolytes. HOME CARE INSTRUCTIONS   Ask your caregiver about specific rehydration instructions.  Drink enough fluids to keep your urine clear or pale yellow.  Drink small amounts frequently if you have nausea and vomiting.  Eat as you normally do.  Avoid:  Foods or drinks high in sugar.  Carbonated  drinks.  Juice.  Extremely hot or cold fluids.  Drinks with caffeine.  Fatty, greasy foods.  Alcohol.  Tobacco.  Overeating.  Gelatin desserts.  Wash your hands well to avoid spreading bacteria and viruses.  Only take over-the-counter or prescription medicines for pain, discomfort, or fever as directed by your caregiver.  Ask your caregiver if you should continue all prescribed and over-the-counter medicines.  Keep all follow-up appointments with your caregiver. SEEK MEDICAL CARE IF:  You have abdominal pain and it increases or stays in one area (localizes).  You have a rash, stiff neck, or severe headache.  You are irritable, sleepy, or difficult to awaken.  You are weak, dizzy, or extremely thirsty. SEEK IMMEDIATE MEDICAL CARE IF:   You are unable to keep fluids down or you get worse despite treatment.  You have frequent episodes of vomiting or diarrhea.  You have blood or green matter (bile) in your vomit.  You have blood in your stool or your stool looks black and tarry.  You have not urinated in 6 to 8 hours, or you have only urinated a small amount of very dark urine.  You have a fever.  You faint. MAKE SURE YOU:   Understand these instructions.  Will watch your condition.  Will get help right away if you are not doing well or get worse. Document Released: 04/04/2005 Document Revised: 06/27/2011 Document Reviewed: 11/22/2010 [employer] Patient Information 2014 [employer].  

## 2013-01-31 ENCOUNTER — Telehealth: Payer: Self-pay | Admitting: *Deleted

## 2013-01-31 ENCOUNTER — Ambulatory Visit: Payer: BC Managed Care – PPO

## 2013-01-31 NOTE — Telephone Encounter (Signed)
Patient's daughter called and canceled appt for today

## 2013-02-01 ENCOUNTER — Ambulatory Visit: Payer: BC Managed Care – PPO

## 2013-02-05 ENCOUNTER — Encounter (INDEPENDENT_AMBULATORY_CARE_PROVIDER_SITE_OTHER): Payer: BC Managed Care – PPO | Admitting: General Surgery

## 2013-02-05 ENCOUNTER — Telehealth (INDEPENDENT_AMBULATORY_CARE_PROVIDER_SITE_OTHER): Payer: Self-pay | Admitting: General Surgery

## 2013-02-05 NOTE — Telephone Encounter (Signed)
May refill her Zofran.

## 2013-02-05 NOTE — Telephone Encounter (Signed)
Pt of Dr. Abbey Chatters called to request some Zofran.  She has suddenly become very nauseated and has already rescheduled her appt this afternoon to Friday instead.  She is asking if Dr. Abbey Chatters will please call in some medicine for her nausea.  Please advise.

## 2013-02-05 NOTE — Telephone Encounter (Signed)
Per Dr. Abbey Chatters, called in Ondansetron 4 mg, # 30, 1 po Q8H prn nausea, 3 RF to [facility]:  [phone].  Pt notified to pick up later this afternoon.

## 2013-02-08 ENCOUNTER — Ambulatory Visit (INDEPENDENT_AMBULATORY_CARE_PROVIDER_SITE_OTHER): Payer: BC Managed Care – PPO | Admitting: General Surgery

## 2013-02-08 ENCOUNTER — Encounter (INDEPENDENT_AMBULATORY_CARE_PROVIDER_SITE_OTHER): Payer: Self-pay | Admitting: General Surgery

## 2013-02-08 VITALS — BP 118/64 | HR 70 | Temp 97.6°F | Resp 14 | Ht 65.0 in | Wt 134.2 lb

## 2013-02-08 DIAGNOSIS — S31109D Unspecified open wound of abdominal wall, unspecified quadrant without penetration into peritoneal cavity, subsequent encounter: Secondary | ICD-10-CM

## 2013-02-08 DIAGNOSIS — Z5189 Encounter for other specified aftercare: Secondary | ICD-10-CM

## 2013-02-08 NOTE — Patient Instructions (Signed)
Continue current wound care

## 2013-02-08 NOTE — Progress Notes (Signed)
Subjective:     Patient ID: [name], female   DOB: January 04, 1951, 62 y.o.   MRN: [id number]  HPI  She is here for a wound check. She was in the hospital with pneumonia and I did check on her there. She's not having any bleeding from the wound. Still has times when she has a lot of output the ileostomy. She still has somewhat of a cough.   Review of Systems     Objective:   Physical Exam Gen.-she is thin and in no acute distress.  Abdomen-open wound is clean with granulation tissue. Some mesh is present but is in the granulation tissue. No bleeding. Wound is slightly smaller, ileostomy demonstrates green, pasty output.  Extremities-she has significant dependent rubor of the lower extremities.    Assessment:     Chronic open wound. Slowly healing in by secondary intention. Has not been able to make appointment to the plastic surgeon because of hospitalizations.     Plan:     Continue current wound care. Return visit 5 weeks. When she is feeling better, or she can make her appointment with the plastic surgeon.

## 2013-02-19 ENCOUNTER — Ambulatory Visit (INDEPENDENT_AMBULATORY_CARE_PROVIDER_SITE_OTHER): Payer: BC Managed Care – PPO | Admitting: Cardiology

## 2013-02-19 ENCOUNTER — Encounter (INDEPENDENT_AMBULATORY_CARE_PROVIDER_SITE_OTHER): Payer: Self-pay

## 2013-02-19 ENCOUNTER — Encounter: Payer: Self-pay | Admitting: Cardiology

## 2013-02-19 VITALS — BP 140/70 | Ht 65.0 in | Wt 141.0 lb

## 2013-02-19 DIAGNOSIS — K551 Chronic vascular disorders of intestine: Secondary | ICD-10-CM

## 2013-02-19 DIAGNOSIS — I119 Hypertensive heart disease without heart failure: Secondary | ICD-10-CM

## 2013-02-19 DIAGNOSIS — D638 Anemia in other chronic diseases classified elsewhere: Secondary | ICD-10-CM

## 2013-02-19 DIAGNOSIS — D469 Myelodysplastic syndrome, unspecified: Secondary | ICD-10-CM

## 2013-02-19 DIAGNOSIS — I4891 Unspecified atrial fibrillation: Secondary | ICD-10-CM

## 2013-02-19 DIAGNOSIS — I480 Paroxysmal atrial fibrillation: Secondary | ICD-10-CM

## 2013-02-19 NOTE — Assessment & Plan Note (Signed)
The patient has a functioning ileostomy.  She is followed closely by Dr. Abbey Chatters.  She tries to cut down on the high volume of output from the ileostomy by taking Lomotil at bedtime and taking Imodium up 4 or 5 times during the day.

## 2013-02-19 NOTE — Assessment & Plan Note (Signed)
The patient is eating better since last visit.  The severe mouth blisters and ulcers improved after [name] took some Valtrex suggesting that it may have been a herpes simplex type of infection.  The Diflucan had not seemed to make much difference.  [name] is eating better now her weight is up to 141

## 2013-02-19 NOTE — Progress Notes (Signed)
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 In early 2013 she was hospitalized on the medical service after presenting with sudden onset of severe diarrhea associated with syncope and hypotension. During the course of that hospitalization she underwent colonoscopy which showed ischemic colitis. Also during that hospital stay she also had episodes of paroxysmal atrial fibrillation which responded to beta blocker. During that hospital stay she had an echocardiogram showing normal LV systolic function and normal left atrial size. Because of her past history of anemia and multiple comorbidities she was not placed on warfarin. She has a past history of small bowel obstruction. Her surgeon is Dr. Abbey Chatters.  She has multiple medical issues which are as outlined below. These include PAF, ischemic colitis, depression, HTN, and tobacco abuse. She has a normal EF per echo from earlier this year.  In September 2013 she was admitted at [facility] with another attack of ischemic colitis. She ended up having colon surgery with a colostomy placed. She had PAF while hospitalized. Also with MRSA from a PICC line.  She subsequently had exploratory abdominal surgery here in [place] and  had a prolonged post hospital course.  Dr. Abbey Chatters is her surgeon  During her hospitalization the patient had paroxysmal atrial fibrillation with rapid ventricular response.  She has had several recent admissions to [facility].  She was hospitalized a month ago with severe anemia and a hemoglobin of 7 and required transfusion.  Apparently it was felt that the anemia was secondary to chronic blood loss around her stoma.  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 She was readmitted a week ago with weakness  and suspected right lower lobe pneumonia.  She had paroxysmal atrial fibrillation during that hospitalization.  She also had problems with hyponatremia and difficulties with fluid balance.    She missed her last oncology appointment because she was in the hospital and she has not met her new oncologist yet.  Dr. Gaylyn Rong had been her oncologist.  She has a history of myelodysplastic syndrome. Current Outpatient Prescriptions  Medication Sig Dispense Refill  . albuterol (PROVENTIL HFA;VENTOLIN HFA) 108 (90 BASE) MCG/ACT inhaler Inhale 1-2 puffs into the lungs every 6 (six) hours as needed for wheezing.      Marland Kitchen aspirin 325 MG EC tablet Take 325 mg by mouth daily.      . bisoprolol (ZEBETA) 5 MG tablet Take 1 tablet (5 mg total) by mouth daily before breakfast.  90 tablet  3  . buPROPion (WELLBUTRIN XL) 300 MG 24 hr tablet Take 300 mg by mouth daily before breakfast.       . celecoxib (CELEBREX) 200 MG capsule Take 200 mg by mouth daily.      . diphenoxylate-atropine (LOMOTIL) 2.5-0.025 MG per tablet Take 1 tablet by mouth as needed.       Marland Kitchen esomeprazole (NEXIUM) 40 MG capsule Take 40 mg by mouth every morning.       Marland Kitchen estradiol (ESTRACE) 2 MG tablet Take 2 mg by mouth daily before breakfast.       . furosemide (LASIX) 40 MG tablet       . loperamide (IMODIUM) 2 MG capsule Take 2 mg by mouth 4 (four) times daily as needed for diarrhea  or loose stools.      Marland Kitchen loratadine-pseudoephedrine (CLARITIN-D 12-HOUR) 5-120 MG per tablet Take 1 tablet by mouth daily as needed for allergies.       Marland Kitchen ondansetron (ZOFRAN) 4 MG tablet Take 4 mg by mouth every 8 (eight) hours as needed for nausea.      . promethazine (PHENERGAN) 25 MG tablet Take 25 mg by mouth every 6 (six) hours as needed for nausea.      . silver nitrate applicators 75-25 % applicator Apply 1 Stick topically every 5 (five) minutes x 3 doses as needed (Please send 4 doses now/Apply to bleeders in abd incision stat).  100 each  0  . tiotropium (SPIRIVA) 18  MCG inhalation capsule Place 1 capsule (18 mcg total) into inhaler and inhale daily.  30 capsule  0  . Alum & Mag Hydroxide-Simeth (MAGIC MOUTHWASH W/LIDOCAINE) SOLN Take 5 mLs by mouth 4 (four) times daily.  120 mL  0  . fluconazole (DIFLUCAN) 200 MG tablet Take 1 tablet (200 mg total) by mouth daily.  4 tablet  0  . HYDROcodone-acetaminophen (NORCO/VICODIN) 5-325 MG per tablet As needed       No current facility-administered medications for this visit.    Allergies  Allergen Reactions  . Vancomycin Hives and Rash    ? wheezing  . Ativan [Lorazepam]     confusion  . Codeine Nausea And Vomiting  . Tetanus Toxoids Other (See Comments)    serum  . Penicillins Hives and Rash  . Xarelto [Rivaroxaban] Hives and Rash    ?    Patient Active Problem List   Diagnosis Date Noted  . Anemia of other chronic disease 01/28/2013  . HCAP (healthcare-associated pneumonia) 01/11/2013  . Obstructive chronic bronchitis with exacerbation 01/06/2013  . Severe sepsis 01/04/2013  . Acute respiratory failure 12/20/2012  . Cough 11/19/2012  . Hypocalcemia 07/17/2012  . Hypomagnesemia 06/27/2012  . Open abdominal wall wound 06/20/2012  . Ileostomy status 06/20/2012  . Elevated liver function tests-chronic 05/17/2012  . Ischemic colitis 01/31/2012  . 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 Osteoarthritis of hip 10/27/2011  . Postoperative anemia due to acute blood loss 10/27/2011  . Chronic hyponatremia 10/27/2011  . WBC disease 10/27/2011  . Ileus 10/27/2011  . Partial small bowel obstruction 07/05/2011  . Atrial fibrillation 07/05/2011  . Chronic ischemic colitis 07/05/2011  . Hypotension 07/05/2011    Class: Acute  . Atrial fibrillation with rapid ventricular response 05/09/2011  . Chronic back pain 05/08/2011  . Diarrhea 05/07/2011  . Dehydration 05/07/2011  . Benign hypertensive heart disease without heart failure 12/15/2010  . Pure hypercholesterolemia 12/15/2010  .  Anemia 12/15/2010    History  Smoking status  . Current Some Day Smoker -- 0.25 packs/day for 20 years  . Types: Cigarettes  Smokeless tobacco  . Never Used    History  Alcohol Use No    Comment: 1 - 2 glasses of wine per week    Family History  Problem Relation Age of Onset  . Stroke Father   . Atrial fibrillation Father   . Hypertension Mother     Review of Systems: Constitutional: no fever chills diaphoresis or fatigue or change in weight.  Head and neck: no hearing loss, no epistaxis, no photophobia or visual disturbance. Respiratory: No cough, shortness of breath or wheezing. Cardiovascular: No chest pain peripheral edema, palpitations. Gastrointestinal: No abdominal distention, no abdominal pain, no change in bowel habits hematochezia or melena. Genitourinary: No dysuria, no  frequency, no urgency, no nocturia. Musculoskeletal:No arthralgias, no back pain, no gait disturbance or myalgias. Neurological: No dizziness, no headaches, no numbness, no seizures, no syncope, no weakness, no tremors. Hematologic: No lymphadenopathy, no easy bruising. Psychiatric: No confusion, no hallucinations, no sleep disturbance.    Physical Exam: Filed Vitals:   02/19/13 1202  BP: 140/70   the general appearance reveals a chronically ill middle-age woman in no distress.The head and neck exam reveals pupils equal and reactive.  Extraocular movements are full.  There is no scleral icterus.  The mouth reveals changes are candidiasis.  The neck is supple.  The carotids reveal no bruits.  The jugular venous pressure is normal.  The  thyroid is not enlarged.  There is no lymphadenopathy.  The chest  reveals scattered rhonchi bilaterally but worse on the right base.  There is no dullness to percussion.  Expansion of the chest is symmetrical.  The precordium is quiet.  The first heart sound is normal.  The second heart sound is physiologically split.  There is no murmur gallop rub or click.  There  is no abnormal lift or heave.  The abdomen is soft and nontender.  There is an ileostomy in the right lower abdomen.  There is a fistula in the midline of the abdomen which has a dressing which was not examined.  The bowel sounds are normal.  The liver and spleen are not enlarged.  There are no abdominal masses.  There are no abdominal bruits.  There is an ileostomy present.  Extremities reveal weak pedal pulses.  There is no phlebitis or edema.  There is acrocyanosis and coolness of her feet bilaterally. Strength is normal and symmetrical in all extremities.  There is no lateralizing weakness.  There are no sensory deficits.  The skin is warm and dry.  There is no rash.     Assessment / Plan: Continue same medication.  Check in 3 months for followup office visit and EKG.  She is still smoking a few cigarettes daily and I've urged her to quit smoking altogether.

## 2013-02-19 NOTE — Patient Instructions (Signed)
Your physician recommends that you continue on your current medications as directed. Please refer to the Current Medication list given to you today.  Your physician recommends that you schedule a follow-up appointment in: 3 month ov/ekg 

## 2013-02-19 NOTE — Assessment & Plan Note (Signed)
The patient has had no further episodes of atrial fibrillation.  She is maintaining normal sinus rhythm on bisoprolol.

## 2013-02-19 NOTE — Assessment & Plan Note (Signed)
Blood pressure has been remaining stable on current therapy.  Blood pressure today is 140/70 range and this is what it runs at home.

## 2013-02-27 ENCOUNTER — Encounter (INDEPENDENT_AMBULATORY_CARE_PROVIDER_SITE_OTHER): Payer: Self-pay

## 2013-02-27 ENCOUNTER — Ambulatory Visit (HOSPITAL_BASED_OUTPATIENT_CLINIC_OR_DEPARTMENT_OTHER): Payer: BC Managed Care – PPO | Admitting: Hematology and Oncology

## 2013-02-27 ENCOUNTER — Encounter: Payer: Self-pay | Admitting: Hematology and Oncology

## 2013-02-27 ENCOUNTER — Other Ambulatory Visit (HOSPITAL_BASED_OUTPATIENT_CLINIC_OR_DEPARTMENT_OTHER): Payer: BC Managed Care – PPO | Admitting: Lab

## 2013-02-27 VITALS — BP 109/77 | HR 88 | Temp 96.4°F | Resp 18 | Wt 137.4 lb

## 2013-02-27 DIAGNOSIS — D638 Anemia in other chronic diseases classified elsewhere: Secondary | ICD-10-CM

## 2013-02-27 DIAGNOSIS — C9310 Chronic myelomonocytic leukemia not having achieved remission: Secondary | ICD-10-CM

## 2013-02-27 DIAGNOSIS — D649 Anemia, unspecified: Secondary | ICD-10-CM

## 2013-02-27 DIAGNOSIS — F17200 Nicotine dependence, unspecified, uncomplicated: Secondary | ICD-10-CM

## 2013-02-27 DIAGNOSIS — E538 Deficiency of other specified B group vitamins: Secondary | ICD-10-CM

## 2013-02-27 DIAGNOSIS — E860 Dehydration: Secondary | ICD-10-CM

## 2013-02-27 HISTORY — DX: Deficiency of other specified B group vitamins: E53.8

## 2013-02-27 LAB — CBC WITH DIFFERENTIAL/PLATELET
Basophils Absolute: 0 10*3/uL (ref 0.0–0.1)
EOS%: 2.5 % (ref 0.0–7.0)
Eosinophils Absolute: 0.2 10*3/uL (ref 0.0–0.5)
HCT: 29 % — ABNORMAL LOW (ref 34.8–46.6)
LYMPH%: 32.1 % (ref 14.0–49.7)
MCH: 31.1 pg (ref 25.1–34.0)
MCV: 98 fL (ref 79.5–101.0)
MONO%: 18.2 % — ABNORMAL HIGH (ref 0.0–14.0)
NEUT#: 3.4 10*3/uL (ref 1.5–6.5)
Platelets: 275 10*3/uL (ref 145–400)
RBC: 2.96 10*6/uL — ABNORMAL LOW (ref 3.70–5.45)

## 2013-02-27 LAB — TECHNOLOGIST REVIEW

## 2013-02-27 LAB — COMPREHENSIVE METABOLIC PANEL (CC13)
Albumin: 2.8 g/dL — ABNORMAL LOW (ref 3.5–5.0)
Alkaline Phosphatase: 1824 U/L — ABNORMAL HIGH (ref 40–150)
BUN: 15.5 mg/dL (ref 7.0–26.0)
Glucose: 99 mg/dl (ref 70–140)
Sodium: 133 mEq/L — ABNORMAL LOW (ref 136–145)
Total Bilirubin: 0.36 mg/dL (ref 0.20–1.20)
Total Protein: 7.2 g/dL (ref 6.4–8.3)

## 2013-02-27 NOTE — Progress Notes (Signed)
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 She has significant surgery, sepsis, recurrent hospitalization including intensive care, recurrent blood transfusion, finally was referred to see a hematologist for evaluation. She had extensive workup and subsequently was found to have B12 deficiency along with the diagnosis of CMML. She had a bone marrow aspirate and biopsy which was not grossly abnormal but suggestive of possible diagnosis of CMML due to mild dyspoiesis with mildly elevated monocyte count. She was being observed.  INTERVAL HISTORY: [name] 62 y.o. female returns for further followup. Since I saw her, I gave her some IV fluids and anti-emetics. The patient felt worse. She is to have chronic nonhealing wound. The patient is attempting to quit smoking. The patient denies any recent signs or symptoms of bleeding such as spontaneous epistaxis, hematuria or hematochezia.  I have reviewed the past medical history, past surgical history, social history and family history with the patient and they are unchanged from previous note.  ALLERGIES:  is allergic to vancomycin; ativan; codeine; tetanus toxoids; penicillins; and xarelto.  MEDICATIONS:  Current Outpatient Prescriptions  Medication Sig Dispense Refill  . albuterol (PROVENTIL HFA;VENTOLIN HFA) 108 (90 BASE) MCG/ACT inhaler Inhale 1-2 puffs into the lungs every 6 (six) hours as needed for wheezing.      . Alum & Mag Hydroxide-Simeth (MAGIC MOUTHWASH W/LIDOCAINE) SOLN Take 5 mLs by mouth 4 (four) times daily.  120 mL  0  . aspirin  325 MG EC tablet Take 325 mg by mouth daily.      . bisoprolol (ZEBETA) 5 MG tablet Take 1 tablet (5 mg total) by mouth daily before breakfast.  90 tablet  3  . buPROPion (WELLBUTRIN XL) 300 MG 24 hr tablet Take 300 mg by mouth daily before breakfast.       . celecoxib (CELEBREX) 200 MG capsule Take 200 mg by mouth daily.      . diphenoxylate-atropine (LOMOTIL) 2.5-0.025 MG per tablet Take 1 tablet by mouth as needed.       Marland Kitchen esomeprazole (NEXIUM) 40 MG capsule Take 40 mg by mouth every morning.       Marland Kitchen estradiol (ESTRACE) 2 MG tablet Take 2 mg by mouth daily before breakfast.       . furosemide (LASIX) 40 MG tablet       . loperamide (IMODIUM) 2 MG capsule Take 2 mg by mouth 4 (four) times daily as needed for diarrhea or loose stools.      Marland Kitchen loratadine-pseudoephedrine (CLARITIN-D 12-HOUR) 5-120 MG per tablet Take 1 tablet by mouth daily as needed for allergies.       Marland Kitchen ondansetron (ZOFRAN) 4 MG tablet Take 4 mg by mouth every 8 (eight) hours as needed for nausea.      . promethazine (PHENERGAN) 25 MG tablet Take 25 mg by mouth every 6 (six) hours as needed for nausea.      . silver nitrate applicators 75-25 % applicator Apply 1 Stick topically every 5 (five) minutes x 3 doses as needed (Please send 4 doses now/Apply to bleeders in abd incision stat).  100 each  0  . tiotropium (SPIRIVA) 18 MCG inhalation capsule  Place 1 capsule (18 mcg total) into inhaler and inhale daily.  30 capsule  0  . HYDROcodone-acetaminophen (NORCO/VICODIN) 5-325 MG per tablet As needed       No current facility-administered medications for this visit.    REVIEW OF SYSTEMS:   Constitutional: Denies fevers, chills or abnormal weight loss Behavioral/Psych: Mood is stable, no new changes  All other systems were reviewed with the patient and are negative.  PHYSICAL EXAMINATION: ECOG PERFORMANCE STATUS: 1 - Symptomatic but completely ambulatory  There were no vitals filed for this visit. There were no vitals filed for  this visit.  GENERAL:alert, no distress and comfortable NEURO: alert & oriented x 3 with fluent speech, no focal motor/sensory deficits  LABORATORY DATA:  I have reviewed the data as listed    Component Value Date/Time   NA 131* 01/15/2013 1626   NA 130* 02/09/2012 1321   K 4.0 01/15/2013 1626   K 4.6 02/09/2012 1321   CL 105 01/15/2013 1626   CL 101 02/09/2012 1321   CO2 15* 01/15/2013 1626   CO2 18* 02/09/2012 1321   GLUCOSE 98 01/15/2013 1626   GLUCOSE 79 02/09/2012 1321   BUN 7 01/15/2013 1626   BUN 21.0 02/09/2012 1321   CREATININE 0.8 01/15/2013 1626   CREATININE 0.92 08/10/2012 1528   CREATININE 0.8 02/09/2012 1321   CALCIUM 7.2* 01/15/2013 1626   CALCIUM 9.2 02/09/2012 1321   PROT 5.8* 05/31/2012 0615   PROT 7.0 02/09/2012 1321   ALBUMIN 3.5 07/16/2012 0458   ALBUMIN 3.8 02/09/2012 1321   AST 30 05/31/2012 0615   AST 53* 02/09/2012 1321   ALT 23 05/31/2012 0615   ALT 56* 02/09/2012 1321   ALKPHOS 456* 05/31/2012 0615   ALKPHOS 712* 02/09/2012 1321   BILITOT 0.3 05/31/2012 0615   BILITOT 0.50 02/09/2012 1321   GFRNONAA 89* 01/11/2013 0348   GFRAA >90 01/11/2013 0348    No results found for this basename: SPEP,  UPEP,   kappa and lambda light chains    Lab Results  Component Value Date   WBC 7.1 02/27/2013   NEUTROABS 3.4 02/27/2013   HGB 9.2* 02/27/2013   HCT 29.0* 02/27/2013   MCV 98.0 02/27/2013   PLT 275 02/27/2013      Chemistry      Component Value Date/Time   NA 131* 01/15/2013 1626   NA 130* 02/09/2012 1321   K 4.0 01/15/2013 1626   K 4.6 02/09/2012 1321   CL 105 01/15/2013 1626   CL 101 02/09/2012 1321   CO2 15* 01/15/2013 1626   CO2 18* 02/09/2012 1321   BUN 7 01/15/2013 1626   BUN 21.0 02/09/2012 1321   CREATININE 0.8 01/15/2013 1626   CREATININE 0.92 08/10/2012 1528   CREATININE 0.8 02/09/2012 1321      Component Value Date/Time   CALCIUM 7.2* 01/15/2013 1626   CALCIUM 9.2 02/09/2012 1321   ALKPHOS 456* 05/31/2012 0615   ALKPHOS 712* 02/09/2012 1321    AST 30 05/31/2012 0615   AST 53* 02/09/2012 1321   ALT 23 05/31/2012 0615   ALT 56* 02/09/2012 1321   BILITOT 0.3 05/31/2012 0615   BILITOT 0.50 02/09/2012 1321     ASSESSMENT & PLAN:  #1 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 The bone marrow biopsy was done in this setting when she was unwell. Sometime monocytosis is seen in the bone marrow in the setting of chronic infection. The mild dyspoiesis could be related to her history of  B12 deficiency. I think we will have to revisit this diagnosis in the near future. #2 chronic anemia This is anemia chronic disease. She extensive surgery, infection, and chronic oozing from her wound. We will continue aggressive supportive care and transfuse as needed. #3 history of B12 deficiency Recheck on the B12 level is satisfactory. I recommend she continue oral high-dose B12 supplements for now.  #4 dehydration due to high output ileostomy h this is due to high output ileostomy and her inability to maintain oral intake.  She felt that the IV fluids prescribed to her make her diarrhea worse. I would defer management to her gastroenterologist and her surgeon. #5 Poor wound healing We had a long discussion about this. Her chronic smoking  contributes it to her poor wound healing. #6 chronic smoking  The patient is motivated to quit smoking on her own Orders Placed This Encounter  Procedures  . Vitamin B12    Standing Status: Future     Number of Occurrences:      Standing Expiration Date: 02/27/2014  . CBC & Diff and Retic    Standing Status: Future     Number of Occurrences:      Standing Expiration Date: 02/27/2014  . Ferritin    Standing Status: Future     Number of Occurrences:      Standing Expiration Date: 02/27/2014  . Erythropoietin    Standing Status: Future     Number of Occurrences:      Standing Expiration Date: 02/27/2014   All questions were answered. The patient knows to call the clinic with any problems,  questions or concerns. No barriers to learning was detected. I spent 15 minutes counseling the patient face to face. The total time spent in the appointment was 20 minutes and more than 50% was on counseling and review of test results     [facility], Kari Gassett, MD 02/27/2013 3:07 PM

## 2013-02-28 ENCOUNTER — Telehealth: Payer: Self-pay | Admitting: Hematology and Oncology

## 2013-02-28 NOTE — Telephone Encounter (Signed)
, °

## 2013-03-12 ENCOUNTER — Encounter (INDEPENDENT_AMBULATORY_CARE_PROVIDER_SITE_OTHER): Payer: Self-pay | Admitting: General Surgery

## 2013-03-12 ENCOUNTER — Ambulatory Visit (INDEPENDENT_AMBULATORY_CARE_PROVIDER_SITE_OTHER): Payer: BC Managed Care – PPO | Admitting: General Surgery

## 2013-03-12 VITALS — BP 110/70 | HR 64 | Temp 98.3°F | Resp 14 | Ht 65.0 in | Wt 139.4 lb

## 2013-03-12 DIAGNOSIS — S31109D Unspecified open wound of abdominal wall, unspecified quadrant without penetration into peritoneal cavity, subsequent encounter: Secondary | ICD-10-CM

## 2013-03-12 DIAGNOSIS — L02219 Cutaneous abscess of trunk, unspecified: Secondary | ICD-10-CM

## 2013-03-12 NOTE — Progress Notes (Signed)
Subjective:     Patient ID: [name], female   DOB: 1951-03-16, 62 y.o.   MRN: [id number]  HPI  She is here for a wound check.  She has been feeling good. She has been heating better and gaining weight. Ileostomy output is still high at times.  Review of Systems     Objective:   Physical Exam Gen.-she is thin and in no acute distress.  Abdomen-open wound is clean with granulation tissue. I sharply debrided some of the exposed mesh. The wound has actually closed it very nicely versus her last visit and is getting smaller.  Extremities-she has significant dependent rubor of the lower extremities.    Assessment:     Chronic open wound-Wound is healing in well over this last interval.   Plan:     Change dressing twice a day. Return visit 3 weeks.

## 2013-03-12 NOTE — Patient Instructions (Signed)
Do dressing changes twice a day. 

## 2013-03-13 ENCOUNTER — Encounter (INDEPENDENT_AMBULATORY_CARE_PROVIDER_SITE_OTHER): Payer: BC Managed Care – PPO | Admitting: General Surgery

## 2013-03-24 ENCOUNTER — Emergency Department (HOSPITAL_COMMUNITY): Payer: BC Managed Care – PPO

## 2013-03-24 ENCOUNTER — Other Ambulatory Visit: Payer: Self-pay

## 2013-03-24 ENCOUNTER — Inpatient Hospital Stay (HOSPITAL_COMMUNITY)
Admission: EM | Admit: 2013-03-24 | Discharge: 2013-03-27 | DRG: 194 | Disposition: A | Discharge status: Home / Self Care | Payer: BC Managed Care – PPO | Attending: Internal Medicine | Admitting: Internal Medicine

## 2013-03-24 ENCOUNTER — Encounter (HOSPITAL_COMMUNITY): Payer: Self-pay | Admitting: Emergency Medicine

## 2013-03-24 DIAGNOSIS — E538 Deficiency of other specified B group vitamins: Secondary | ICD-10-CM | POA: Diagnosis present

## 2013-03-24 DIAGNOSIS — K551 Chronic vascular disorders of intestine: Secondary | ICD-10-CM | POA: Diagnosis present

## 2013-03-24 DIAGNOSIS — M1610 Unilateral primary osteoarthritis, unspecified hip: Secondary | ICD-10-CM | POA: Diagnosis present

## 2013-03-24 DIAGNOSIS — C9210 Chronic myeloid leukemia, BCR/ABL-positive, not having achieved remission: Secondary | ICD-10-CM | POA: Diagnosis present

## 2013-03-24 DIAGNOSIS — K56600 Partial intestinal obstruction, unspecified as to cause: Secondary | ICD-10-CM

## 2013-03-24 DIAGNOSIS — M549 Dorsalgia, unspecified: Secondary | ICD-10-CM | POA: Diagnosis present

## 2013-03-24 DIAGNOSIS — Z8614 Personal history of Methicillin resistant Staphylococcus aureus infection: Secondary | ICD-10-CM

## 2013-03-24 DIAGNOSIS — E222 Syndrome of inappropriate secretion of antidiuretic hormone: Secondary | ICD-10-CM

## 2013-03-24 DIAGNOSIS — R509 Fever, unspecified: Secondary | ICD-10-CM | POA: Diagnosis present

## 2013-03-24 DIAGNOSIS — E871 Hypo-osmolality and hyponatremia: Secondary | ICD-10-CM

## 2013-03-24 DIAGNOSIS — J9600 Acute respiratory failure, unspecified whether with hypoxia or hypercapnia: Secondary | ICD-10-CM

## 2013-03-24 DIAGNOSIS — K567 Ileus, unspecified: Secondary | ICD-10-CM

## 2013-03-24 DIAGNOSIS — IMO0002 Reserved for concepts with insufficient information to code with codable children: Secondary | ICD-10-CM

## 2013-03-24 DIAGNOSIS — Y833 Surgical operation with formation of external stoma as the cause of abnormal reaction of the patient, or of later complication, without mention of misadventure at the time of the procedure: Secondary | ICD-10-CM | POA: Diagnosis present

## 2013-03-24 DIAGNOSIS — K559 Vascular disorder of intestine, unspecified: Secondary | ICD-10-CM | POA: Diagnosis present

## 2013-03-24 DIAGNOSIS — Z96649 Presence of unspecified artificial hip joint: Secondary | ICD-10-CM

## 2013-03-24 DIAGNOSIS — C9310 Chronic myelomonocytic leukemia not having achieved remission: Secondary | ICD-10-CM | POA: Diagnosis present

## 2013-03-24 DIAGNOSIS — E872 Acidosis, unspecified: Secondary | ICD-10-CM | POA: Diagnosis present

## 2013-03-24 DIAGNOSIS — J189 Pneumonia, unspecified organism: Secondary | ICD-10-CM

## 2013-03-24 DIAGNOSIS — E7800 Pure hypercholesterolemia, unspecified: Secondary | ICD-10-CM

## 2013-03-24 DIAGNOSIS — E860 Dehydration: Secondary | ICD-10-CM

## 2013-03-24 DIAGNOSIS — J111 Influenza due to unidentified influenza virus with other respiratory manifestations: Secondary | ICD-10-CM

## 2013-03-24 DIAGNOSIS — D62 Acute posthemorrhagic anemia: Secondary | ICD-10-CM

## 2013-03-24 DIAGNOSIS — D630 Anemia in neoplastic disease: Secondary | ICD-10-CM | POA: Diagnosis present

## 2013-03-24 DIAGNOSIS — D638 Anemia in other chronic diseases classified elsewhere: Secondary | ICD-10-CM

## 2013-03-24 DIAGNOSIS — E236 Other disorders of pituitary gland: Secondary | ICD-10-CM | POA: Diagnosis present

## 2013-03-24 DIAGNOSIS — I498 Other specified cardiac arrhythmias: Secondary | ICD-10-CM | POA: Diagnosis present

## 2013-03-24 DIAGNOSIS — I119 Hypertensive heart disease without heart failure: Secondary | ICD-10-CM

## 2013-03-24 DIAGNOSIS — Z9049 Acquired absence of other specified parts of digestive tract: Secondary | ICD-10-CM

## 2013-03-24 DIAGNOSIS — Z823 Family history of stroke: Secondary | ICD-10-CM

## 2013-03-24 DIAGNOSIS — S31109A Unspecified open wound of abdominal wall, unspecified quadrant without penetration into peritoneal cavity, initial encounter: Secondary | ICD-10-CM

## 2013-03-24 DIAGNOSIS — Z8249 Family history of ischemic heart disease and other diseases of the circulatory system: Secondary | ICD-10-CM

## 2013-03-24 DIAGNOSIS — I5032 Chronic diastolic (congestive) heart failure: Secondary | ICD-10-CM | POA: Diagnosis present

## 2013-03-24 DIAGNOSIS — M169 Osteoarthritis of hip, unspecified: Secondary | ICD-10-CM | POA: Diagnosis present

## 2013-03-24 DIAGNOSIS — J441 Chronic obstructive pulmonary disease with (acute) exacerbation: Secondary | ICD-10-CM | POA: Diagnosis present

## 2013-03-24 DIAGNOSIS — A419 Sepsis, unspecified organism: Secondary | ICD-10-CM

## 2013-03-24 DIAGNOSIS — J1100 Influenza due to unidentified influenza virus with unspecified type of pneumonia: Principal | ICD-10-CM | POA: Diagnosis present

## 2013-03-24 DIAGNOSIS — R197 Diarrhea, unspecified: Secondary | ICD-10-CM

## 2013-03-24 DIAGNOSIS — S31109S Unspecified open wound of abdominal wall, unspecified quadrant without penetration into peritoneal cavity, sequela: Secondary | ICD-10-CM

## 2013-03-24 DIAGNOSIS — G8929 Other chronic pain: Secondary | ICD-10-CM | POA: Diagnosis present

## 2013-03-24 DIAGNOSIS — I4891 Unspecified atrial fibrillation: Secondary | ICD-10-CM | POA: Diagnosis present

## 2013-03-24 DIAGNOSIS — R05 Cough: Secondary | ICD-10-CM

## 2013-03-24 DIAGNOSIS — Z932 Ileostomy status: Secondary | ICD-10-CM

## 2013-03-24 DIAGNOSIS — D729 Disorder of white blood cells, unspecified: Secondary | ICD-10-CM

## 2013-03-24 DIAGNOSIS — D6959 Other secondary thrombocytopenia: Secondary | ICD-10-CM | POA: Diagnosis present

## 2013-03-24 DIAGNOSIS — T8189XA Other complications of procedures, not elsewhere classified, initial encounter: Secondary | ICD-10-CM | POA: Diagnosis present

## 2013-03-24 DIAGNOSIS — D649 Anemia, unspecified: Secondary | ICD-10-CM | POA: Diagnosis present

## 2013-03-24 DIAGNOSIS — R748 Abnormal levels of other serum enzymes: Secondary | ICD-10-CM | POA: Diagnosis present

## 2013-03-24 LAB — COMPREHENSIVE METABOLIC PANEL
AST: 27 U/L (ref 0–37)
Alkaline Phosphatase: 1150 U/L — ABNORMAL HIGH (ref 39–117)
BUN: 14 mg/dL (ref 6–23)
CO2: 17 mEq/L — ABNORMAL LOW (ref 19–32)
Calcium: 7.9 mg/dL — ABNORMAL LOW (ref 8.4–10.5)
Chloride: 90 mEq/L — ABNORMAL LOW (ref 96–112)
Creatinine, Ser: 0.84 mg/dL (ref 0.50–1.10)
GFR calc Af Amer: 84 mL/min — ABNORMAL LOW (ref >90)
GFR calc non Af Amer: 72 mL/min — ABNORMAL LOW (ref >90)
Potassium: 4.3 mEq/L (ref 3.5–5.1)
Total Bilirubin: 0.5 mg/dL (ref 0.3–1.2)

## 2013-03-24 LAB — URINALYSIS, ROUTINE W REFLEX MICROSCOPIC
Bilirubin Urine: NEGATIVE
Glucose, UA: NEGATIVE mg/dL
Hgb urine dipstick: NEGATIVE
Ketones, ur: NEGATIVE mg/dL
Specific Gravity, Urine: 1.013 (ref 1.005–1.030)
Urobilinogen, UA: 0.2 mg/dL (ref 0.0–1.0)

## 2013-03-24 LAB — PROCALCITONIN: Procalcitonin: 3.71 ng/mL

## 2013-03-24 LAB — URINE MICROSCOPIC-ADD ON

## 2013-03-24 LAB — CBC WITH DIFFERENTIAL/PLATELET
Basophils Absolute: 0 10*3/uL (ref 0.0–0.1)
Basophils Relative: 0 % (ref 0–1)
Eosinophils Absolute: 0 10*3/uL (ref 0.0–0.7)
HCT: 24.4 % — ABNORMAL LOW (ref 36.0–46.0)
Hemoglobin: 8.2 g/dL — ABNORMAL LOW (ref 12.0–15.0)
Lymphocytes Relative: 44 % (ref 12–46)
Lymphs Abs: 2 10*3/uL (ref 0.7–4.0)
MCHC: 33.6 g/dL (ref 30.0–36.0)
MCV: 93.5 fL (ref 78.0–100.0)
Monocytes Relative: 38 % — ABNORMAL HIGH (ref 3–12)
Neutro Abs: 0.8 10*3/uL — ABNORMAL LOW (ref 1.7–7.7)
WBC: 4.5 10*3/uL (ref 4.0–10.5)

## 2013-03-24 LAB — POCT I-STAT TROPONIN I: Troponin i, poc: 0.01 ng/mL (ref 0.00–0.08)

## 2013-03-24 LAB — LACTIC ACID, PLASMA: Lactic Acid, Venous: 1.5 mmol/L (ref 0.5–2.2)

## 2013-03-24 LAB — LIPASE, BLOOD: Lipase: 13 U/L (ref 11–59)

## 2013-03-24 LAB — PRO B NATRIURETIC PEPTIDE: Pro B Natriuretic peptide (BNP): 9254 pg/mL — ABNORMAL HIGH (ref 0–125)

## 2013-03-24 MED ORDER — SODIUM CHLORIDE 0.9 % IV BOLUS (SEPSIS)
1000.0000 mL | Freq: Once | INTRAVENOUS | Status: AC
  Administered 2013-03-24: 1000 mL via INTRAVENOUS

## 2013-03-24 MED ORDER — CELECOXIB 200 MG PO CAPS
200.0000 mg | ORAL_CAPSULE | Freq: Every day | ORAL | Status: DC
  Administered 2013-03-25 – 2013-03-27 (×3): 200 mg via ORAL
  Filled 2013-03-24 (×3): qty 1

## 2013-03-24 MED ORDER — PROMETHAZINE HCL 25 MG/ML IJ SOLN: 25.0000 mg | INTRAMUSCULAR | Status: DC | PRN

## 2013-03-24 MED ORDER — BENZONATATE 100 MG PO CAPS
200.0000 mg | ORAL_CAPSULE | Freq: Three times a day (TID) | ORAL | Status: DC | PRN
  Administered 2013-03-25 – 2013-03-26 (×2): 200 mg via ORAL
  Filled 2013-03-24 (×2): qty 2

## 2013-03-24 MED ORDER — ACETAMINOPHEN 500 MG PO TABS
1000.0000 mg | ORAL_TABLET | Freq: Once | ORAL | Status: AC
  Administered 2013-03-24: 1000 mg via ORAL
  Filled 2013-03-24: qty 2

## 2013-03-24 MED ORDER — MORPHINE SULFATE 2 MG/ML IJ SOLN
2.0000 mg | INTRAMUSCULAR | Status: DC | PRN
  Administered 2013-03-24: 2 mg via INTRAVENOUS
  Filled 2013-03-24: qty 1

## 2013-03-24 MED ORDER — SODIUM CHLORIDE 0.9 % IV BOLUS (SEPSIS)
500.0000 mL | Freq: Once | INTRAVENOUS | Status: AC
  Administered 2013-03-24: 500 mL via INTRAVENOUS

## 2013-03-24 MED ORDER — TIOTROPIUM BROMIDE MONOHYDRATE 18 MCG IN CAPS: 18.0000 ug | ORAL_CAPSULE | Freq: Every day | RESPIRATORY_TRACT | Status: DC

## 2013-03-24 MED ORDER — LINEZOLID 2 MG/ML IV SOLN
600.0000 mg | Freq: Two times a day (BID) | INTRAVENOUS | Status: DC
  Administered 2013-03-24 – 2013-03-25 (×3): 600 mg via INTRAVENOUS
  Filled 2013-03-24 (×4): qty 300

## 2013-03-24 MED ORDER — DM-GUAIFENESIN ER 30-600 MG PO TB12
1.0000 | ORAL_TABLET | Freq: Two times a day (BID) | ORAL | Status: DC
  Administered 2013-03-24 – 2013-03-27 (×6): 1 via ORAL
  Filled 2013-03-24 (×7): qty 1

## 2013-03-24 MED ORDER — DEXTROSE 5 % IV SOLN
2.0000 g | Freq: Three times a day (TID) | INTRAVENOUS | Status: DC
  Administered 2013-03-24 – 2013-03-26 (×5): 2 g via INTRAVENOUS
  Filled 2013-03-24 (×6): qty 2

## 2013-03-24 MED ORDER — MORPHINE SULFATE 2 MG/ML IJ SOLN
2.0000 mg | Freq: Once | INTRAMUSCULAR | Status: AC
  Administered 2013-03-24: 2 mg via INTRAVENOUS
  Filled 2013-03-24: qty 1

## 2013-03-24 MED ORDER — CIPROFLOXACIN IN D5W 400 MG/200ML IV SOLN: 400.0000 mg | Freq: Once | INTRAVENOUS | Status: DC

## 2013-03-24 MED ORDER — BUPROPION HCL ER (XL) 300 MG PO TB24
300.0000 mg | ORAL_TABLET | Freq: Every day | ORAL | Status: DC
  Administered 2013-03-25 – 2013-03-27 (×3): 300 mg via ORAL
  Filled 2013-03-24 (×4): qty 1

## 2013-03-24 MED ORDER — IPRATROPIUM BROMIDE 0.02 % IN SOLN
0.5000 mg | Freq: Four times a day (QID) | RESPIRATORY_TRACT | Status: DC
  Administered 2013-03-24: 0.5 mg via RESPIRATORY_TRACT
  Filled 2013-03-24: qty 2.5

## 2013-03-24 MED ORDER — ACETAMINOPHEN 325 MG PO TABS
650.0000 mg | ORAL_TABLET | Freq: Once | ORAL | Status: AC
  Administered 2013-03-24: 650 mg via ORAL
  Filled 2013-03-24: qty 2

## 2013-03-24 MED ORDER — BISOPROLOL FUMARATE 5 MG PO TABS
5.0000 mg | ORAL_TABLET | Freq: Every day | ORAL | Status: DC
  Administered 2013-03-25: 5 mg via ORAL
  Filled 2013-03-24 (×3): qty 1

## 2013-03-24 MED ORDER — IPRATROPIUM BROMIDE 0.02 % IN SOLN
0.5000 mg | Freq: Once | RESPIRATORY_TRACT | Status: AC
  Administered 2013-03-24: 0.5 mg via RESPIRATORY_TRACT
  Filled 2013-03-24: qty 2.5

## 2013-03-24 MED ORDER — ALBUTEROL SULFATE (5 MG/ML) 0.5% IN NEBU: 2.5000 mg | INHALATION_SOLUTION | RESPIRATORY_TRACT | Status: DC

## 2013-03-24 MED ORDER — MENTHOL 3 MG MT LOZG
1.0000 | LOZENGE | OROMUCOSAL | Status: DC | PRN
  Administered 2013-03-24 – 2013-03-25 (×2): 3 mg via ORAL
  Filled 2013-03-24: qty 9

## 2013-03-24 MED ORDER — ALBUTEROL SULFATE (5 MG/ML) 0.5% IN NEBU
2.5000 mg | INHALATION_SOLUTION | Freq: Four times a day (QID) | RESPIRATORY_TRACT | Status: DC
  Administered 2013-03-24: 2.5 mg via RESPIRATORY_TRACT
  Filled 2013-03-24: qty 0.5

## 2013-03-24 MED ORDER — METHYLPREDNISOLONE SODIUM SUCC 125 MG IJ SOLR
80.0000 mg | INTRAMUSCULAR | Status: DC
  Administered 2013-03-24 – 2013-03-25 (×2): 80 mg via INTRAVENOUS
  Filled 2013-03-24 (×3): qty 1.28

## 2013-03-24 MED ORDER — PROMETHAZINE HCL 25 MG/ML IJ SOLN
25.0000 mg | INTRAMUSCULAR | Status: DC | PRN
Start: 2013-03-24 — End: 2013-03-27
  Administered 2013-03-24 – 2013-03-27 (×6): 25 mg via INTRAVENOUS
  Filled 2013-03-24 (×7): qty 1

## 2013-03-24 MED ORDER — ESTRADIOL 2 MG PO TABS
2.0000 mg | ORAL_TABLET | Freq: Every day | ORAL | Status: DC
  Administered 2013-03-25 – 2013-03-27 (×3): 2 mg via ORAL
  Filled 2013-03-24 (×4): qty 1

## 2013-03-24 MED ORDER — PANTOPRAZOLE SODIUM 40 MG PO TBEC
80.0000 mg | DELAYED_RELEASE_TABLET | Freq: Every day | ORAL | Status: DC
  Administered 2013-03-25 – 2013-03-27 (×3): 80 mg via ORAL
  Filled 2013-03-24 (×3): qty 2

## 2013-03-24 MED ORDER — ASPIRIN EC 325 MG PO TBEC
325.0000 mg | DELAYED_RELEASE_TABLET | Freq: Every day | ORAL | Status: DC
  Administered 2013-03-25 – 2013-03-27 (×3): 325 mg via ORAL
  Filled 2013-03-24 (×3): qty 1

## 2013-03-24 MED ORDER — MORPHINE SULFATE 2 MG/ML IJ SOLN: 2.0000 mg | Freq: Once | INTRAMUSCULAR | Status: DC

## 2013-03-24 MED ORDER — SODIUM CHLORIDE 0.9 % IV SOLN
INTRAVENOUS | Status: DC
  Administered 2013-03-24 – 2013-03-27 (×3): via INTRAVENOUS

## 2013-03-24 MED ORDER — ENOXAPARIN SODIUM 40 MG/0.4ML ~~LOC~~ SOLN
40.0000 mg | SUBCUTANEOUS | Status: DC
  Administered 2013-03-24 – 2013-03-25 (×2): 40 mg via SUBCUTANEOUS
  Filled 2013-03-24 (×3): qty 0.4

## 2013-03-24 MED ORDER — ALBUTEROL SULFATE (5 MG/ML) 0.5% IN NEBU
5.0000 mg | INHALATION_SOLUTION | Freq: Once | RESPIRATORY_TRACT | Status: AC
  Administered 2013-03-24: 5 mg via RESPIRATORY_TRACT
  Filled 2013-03-24: qty 1

## 2013-03-24 NOTE — H&P (Addendum)
[facility] History and Physical  [name] WUJ:[id number] DOB: December 22, 1950 DOA: 03/24/2013  Referring physician:  PCP: Cassell Clement, MD  Specialists:   Chief Complaint: SOB, congestion  HPI: [name] is a 62 y.o.  WF PMHx ischemic colitis, which necessitated multiple abdominal surgeries (see surgical history), currently has a ileostomy bag, and partially healed surgical site mid abdomen from exploratory laparotomy performed by Dr. Avel Peace on 05/22/2012, per Dr.Gorsuch, Ni (oncology) possible.CMML, B12 deficiency. Patient was admitted on 9/4 2014 and 9/19 2014  to hospital for bronchitis and atrial fibrillation with RVR respectively.   Pt from home reports that she started to have congestion, productive cough (yellow sputum), increased SOB x6 days, nausea x6 days, and fever yesterday. Pt has taken OTC meds with no relief, progressively getting worse. Pt reports some SOB, but denies CP.        Review of Systems: The patient denies anorexia,  weight loss,, vision loss, decreased hearing, hoarseness, chest pain, syncope,  peripheral edema, balance deficits, hemoptysis, abdominal pain, melena, hematochezia, severe indigestion/heartburn, hematuria, incontinence, genital sores, muscle weakness, suspicious skin lesions, transient blindness, difficulty walking, depression, unusual weight change, abnormal bleeding, enlarged lymph nodes, angioedema, and breast masses.    TRAVEL HISTORY:  None  Procedure CXR 03/24/2013 -Chronic interstitial markings/ emphysematous. Mild right basilar  opacities, likely atelectasis. Left lower lobe opacity has resolved.  -No pleural effusion or pneumothorax.  -Nonspecific bowel gas pattern without disproportionate small bowel  dilatation to suggest small bowel obstruction.  -No evidence of free air under the diaphragm on the upright view.  -Left hip arthroplasty.   Antibiotics Aztreonam 12/7>> Lidlinezolid 12/7>>    Past Medical History   Diagnosis Date  . Hypertension     She has a past hx of essential  . 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 Inflammation of joint of knee     Since we last last saw her she developed problems with an acute which required surgical drainage by her orthopedist Dr. Cleophas Dunker.  Marland Kitchen MRSA (methicillin resistant Staphylococcus aureus)     Knee surgery drainage was positive for MRSA and she was treated with 3 weeks of doxycycline successfully.  . Diarrhea     Mild  . Exogenous obesity   . GERD (gastroesophageal reflux disease)     2 hosp.- ischemic colitis - residual of Norovirus, 05/2011- sm. bowel obstruction  . Headache(784.0)     migraine headache on occas, less now than when she was younger   . Arthritis     L hip, back, neck   . History of blood transfusion sept 2013    04/2011- /w ischemic colitis , trouble with matching blood  sept 2013  . Anemia     will see hematology consult prior to surgery, recommended by Dr. Patty Sermons  . Anemia 12/15/2010  . Ischemic colitis 01/31/2012  . Atrial flutter     during hospitalization, 04/2011- related to anemia & illness/stress   . Pneumonia     04/2011- not hospitalized , pt. denies SOB, changes in chest, breathing  . 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 Dizziness     occasional  . B12 deficiency 02/27/2013   Past Surgical History  Procedure Laterality Date  . Knee surgery      I&D- 2008, post laceration   . Colonoscopy  05/16/2011    Procedure: COLONOSCOPY;  Surgeon: Vertell Novak., MD;  Location: [facility];  Service: Endoscopy;  Laterality: N/A;  . Small intestine surgery  1992, 1999  . Laparotomy and lysis of adhesions    . Total hip arthroplasty  10/25/2011    Procedure: TOTAL HIP ARTHROPLASTY;  Surgeon: Valeria Batman, MD;  Location: [facility] OR;  Service: Orthopedics;  Laterality: Left;  . Appendectomy  1962  . Abdominal hysterectomy  1988  .  Partial colectomy and colostomy  sept 2013    mucous fistula done  . Partial colectomy  05/17/2012    Procedure: PARTIAL COLECTOMY;  Surgeon: Adolph Pollack, MD;  Location: [facility] ORS;  Service: General;  Laterality: N/A;  . Colostomy closure  05/17/2012    Procedure: COLOSTOMY CLOSURE;  Surgeon: Adolph Pollack, MD;  Location: [facility] ORS;  Service: General;  Laterality: N/A;  . Laparotomy  05/17/2012    Procedure: EXPLORATORY LAPAROTOMY;  Surgeon: Adolph Pollack, MD;  Location: [facility] OR[facility];  Service: General;;  . Lysis of adhesion  05/17/2012    Procedure: LYSIS OF ADHESION;  Surgeon: Adolph Pollack, MD;  Location: [facility];  Service: General;;  . Esophageal biopsy  05/17/2012    Procedure: BIOPSY;  Surgeon: Adolph Pollack, MD;  Location: [facility] OR[facility];  Service: General;;  omental biopsy  . Laparotomy  05/22/2012    Procedure: EXPLORATORY LAPAROTOMY;  Surgeon: Adolph Pollack, MD;  Location: [facility] OR[facility];  Service: General;  Laterality: N/A;  DRAINAGE  INTRA-ABDOMINAL ABSCESS/LYSIS OF ADHESIONSFOR SMALL BOWEL OBSTRUCTION/DIVERTING LOOP ILEOSTOMY   Social History:  1/2 PPD x 20+ yrs, now stopped, She reports that she does not drink alcohol or use illicit drugs.  where does patient live--home,   Can patient participate in ADLs? no  Allergies  Allergen Reactions  . Vancomycin Hives and Rash    ? wheezing  . Ativan [Lorazepam]     confusion  . Codeine Nausea And Vomiting  . Tetanus Toxoids Other (See Comments)    serum  . Penicillins Hives and Rash  . Xarelto [Rivaroxaban] Hives and Rash    ?    Family History  Problem Relation Age of Onset  . Stroke Father   . Atrial fibrillation Father   . Hypertension Mother     Prior to Admission medications   Medication Sig Start Date End Date Taking? Authorizing Provider  albuterol (PROVENTIL HFA;VENTOLIN HFA) 108 (90 BASE) MCG/ACT inhaler Inhale 1-2 puffs into the lungs every 6 (six) hours as needed for wheezing.   Yes Historical Provider, MD  aspirin  325 MG EC tablet Take 325 mg by mouth daily.   Yes Historical Provider, MD  bisoprolol (ZEBETA) 5 MG tablet Take 1 tablet (5 mg total) by mouth daily before breakfast. 08/17/12  Yes Cassell Clement, MD  buPROPion (WELLBUTRIN XL) 300 MG 24 hr tablet Take 300 mg by mouth daily before breakfast.    Yes Historical Provider, MD  celecoxib (CELEBREX) 200 MG capsule Take 200 mg by mouth daily.   Yes Historical Provider, MD  diphenoxylate-atropine (LOMOTIL) 2.5-0.025 MG per tablet Take 1 tablet by mouth 4 (four) times daily as needed for diarrhea or loose stools.    Yes Historical Provider, MD  esomeprazole (NEXIUM) 40 MG capsule Take 40 mg by mouth every morning.    Yes Historical Provider, MD  estradiol (ESTRACE) 2 MG tablet Take 2 mg by mouth daily before breakfast.    Yes Historical Provider, MD  furosemide (LASIX) 40 MG tablet Take 40 mg by mouth as needed for fluid or edema.  12/24/12  Yes Historical Provider, MD  loperamide (IMODIUM) 2 MG capsule Take 2 mg by mouth 4 (four) times daily as needed for diarrhea or loose stools.   Yes Historical Provider, MD  loratadine-pseudoephedrine (CLARITIN-D 12-HOUR) 5-120 MG per tablet Take 1 tablet by mouth daily as needed for allergies.    Yes Historical Provider, MD  ondansetron (ZOFRAN) 4 MG tablet Take 4 mg by mouth every 8 (eight) hours as needed for nausea.   Yes Historical Provider, MD  silver nitrate applicators 75-25 % applicator Apply 1 Stick topically every 5 (five) minutes x 3 doses as needed (Please send 4 doses now/Apply to bleeders in abd incision stat). 12/24/12  Yes Kathlen Mody, MD  tiotropium (SPIRIVA) 18 MCG inhalation capsule Place 1 capsule (18 mcg total) into inhaler and inhale daily. 01/11/13  Yes Rodolph Bong, MD   Physical Exam: Filed Vitals:   03/24/13 1413 03/24/13 1449 03/24/13 1530 03/24/13 1600  BP:   140/69 139/70  Pulse: 123 134 135 129  Temp:  102.6 F (39.2 C)    TempSrc:  Oral    Resp: 24  29 33  SpO2: 96% 95% 96% 97%      General:  A./O. x4, moderate distress secondary to SOB and coughing  Eyes: Pupils equal react to light and accommodation  ENT: Mucosa of naris and throat pink, dry, cracked.  Neck: Negative lymphadenopathy  Cardiovascular: Tachycardic, regular rhythm, negative murmurs rubs or gallops, DP/PT pulse 2+ bilateral  Respiratory: Tachypneic, diffuse expiratory rhonchi   Abdomen: Soft, right lower quadrant ileostomy bag in place negative sign of infection, after removing dressing over the mid abdomen area which required sterile saline (dressing stuck to wound) positive granulation tissue pink negative discharge, eschar tissue around the edges where her surgical incision was made through a mesh from a previous surgery.  Skin: Positive tenting consistent with dehydration  Musculoskeletal: Negative pedal edema, negative cyanosis  Psychiatric: Anxious  Neurologic: Cranial nerves II through XII intact, all extremity strength 5/5, sensation intact throughout  Labs on Admission:  Basic Metabolic Panel:  Recent Labs Lab 03/24/13 1400  NA 122*  K 4.3  CL 90*  CO2 17*  GLUCOSE 102*  BUN 14  CREATININE 0.84  CALCIUM 7.9*   Liver Function Tests:  Recent Labs Lab 03/24/13 1400  AST 27  ALT 31  ALKPHOS 1150*  BILITOT 0.5  PROT 7.4  ALBUMIN 2.9*    Recent Labs Lab 03/24/13 1400  LIPASE 13   No results found for this basename: AMMONIA,  in the last 168 hours CBC:  Recent Labs Lab 03/24/13 1400  WBC 4.5  NEUTROABS 0.8*  HGB 8.2*  HCT 24.4*  MCV 93.5  PLT 122*   Cardiac Enzymes: No results found for this basename: CKTOTAL, CKMB, CKMBINDEX, TROPONINI,  in the last 168 hours  BNP (last 3 results)  Recent Labs  01/05/13 0345 01/09/13 0426 01/11/13 0830  PROBNP 5651.0* 6842.0* 5074.0*   CBG: No results found for this basename: GLUCAP,  in the last 168 hours  Radiological Exams on Admission: Dg Abd Acute W/chest  03/24/2013   CLINICAL DATA:  Shortness of  breath, abdominal pain, nausea  EXAM: ACUTE ABDOMEN SERIES (ABDOMEN 2 VIEW & CHEST 1 VIEW)  COMPARISON:  Chest radiographs dated 01/17/2013. CT abdomen pelvis dated 05/22/2012.  FINDINGS: Chronic interstitial markings/ emphysematous. Mild right basilar opacities, likely atelectasis. Left lower lobe opacity has resolved. No pleural effusion or pneumothorax.  Nonspecific bowel gas pattern without disproportionate small bowel  dilatation to suggest small bowel obstruction. Surgical sutures in the right mid abdomen.  No evidence of free air under the diaphragm on the upright view.  Degenerative changes of the lumbar spine with levoscoliosis.  Left hip arthroplasty.  IMPRESSION: Mild right basilar opacity, likely atelectasis. Prior left lower lobe opacity has resolved.  No findings to suggest small bowel obstruction or free air.   Electronically Signed   By: Charline Bills M.D.   On: 03/24/2013 14:07    EKG: Pending  Assessment/Plan Active Problems:   * No active hospital problems. *   HCAP; -Secondary to patient's allergies will start her on Aztreonam + Linezolid -Start patient on DuoNeb scheduled QID -Mucinex DM -Start steroid burst methylprednisolone 80 mg every 24 hour -O2 via nasal cannula to maintain SpO2> 92% -Bolus 1 L normal saline then returned to normal saline at 8ml/hr -Obtain sputum culture -Obtain lactic acid and a Procalcitonin  Hyponatremia -Most likely secondary to dehydration appears patient's sodium baseline is between 125- 133, should correct with hydration  Dehydration -See hyponatremia  Anemia -Patient's baseline hemoglobin appears to be 8-10 g/dL -The patient's hemoglobin drops below 8 and is symptomatic will consider transfusion   Atrial fibrillation -Currently patient sinus tachycardia however we will continue to monitor. -Obtain EKG to confirm rhythm -Obtain proBNP (patient states never had a meal or heart trouble), but since we will be giving patient large  fluid load will check for CHF -If proBNP extremely elevated consider echocardiogram   Chronic ischemic colitis -Patient's abdominal exam is not consistent with colitis, however patient does have elevated alkaline phosphatase. Will have low threshold for consulting surgery  if patient develops any symptoms of acute ischemic colitis i.e. rapidly increasing alkaline phosphatase, increasing abdominal pain.  Elevated alkaline phosphatase -Most likely secondary to her chronic ischemic colitis continue to monitor  Open abdominal wall wound/Poor wound healing -Consult wound care in the a.m.   Code Status: Full Family Communication: Sister present for discussion of plan of care Disposition Plan: Resolution of HCAP  Time spent: 120 minutes  Nikalas Bramel, Roselind Messier [facility] Pager [phone]  If 7PM-7AM, please contact night-coverage [email or web address] Password [email or web address] 03/24/2013, 4:09 PM

## 2013-03-24 NOTE — ED Provider Notes (Signed)
CSN: [id number]     Arrival date & time 03/24/13  1253 History   First MD Initiated Contact with Patient 03/24/13 1334     Chief Complaint  Patient presents with  . Cough  . Emesis    HPI Pt from home reports that she started to have congestion, cough, N/V x6 days. Pt has taken OTC meds with no relief, progressively getting worse. Pt reports some SOB, but denies CP, diarrhea. Pt is A&O and in NAD. Pt was admitted in Sept 2014 for PNA.  Past Medical History  Diagnosis Date  . Hypertension     She has a past hx of essential  . 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 Inflammation of joint of knee     Since we last last saw her she developed problems with an acute which required surgical drainage by her orthopedist Dr. Cleophas Dunker.  Marland Kitchen MRSA (methicillin resistant Staphylococcus aureus)     Knee surgery drainage was positive for MRSA and she was treated with 3 weeks of doxycycline successfully.  . Diarrhea     Mild  . Exogenous obesity   . GERD (gastroesophageal reflux disease)     2 hosp.- ischemic colitis - residual of Norovirus, 05/2011- sm. bowel obstruction  . Headache(784.0)     migraine headache on occas, less now than when she was younger   . Arthritis     L hip, back, neck   . History of blood transfusion sept 2013    04/2011- /w ischemic colitis , trouble with matching blood  sept 2013  . Anemia     will see hematology consult prior to surgery, recommended by Dr. Patty Sermons  . Anemia 12/15/2010  . Ischemic colitis 01/31/2012  . Atrial flutter     during hospitalization, 04/2011- related to anemia & illness/stress   . Pneumonia     04/2011- not hospitalized , pt. denies SOB, changes in chest, breathing  . 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 Dizziness     occasional  . B12 deficiency 02/27/2013   Past Surgical History  Procedure Laterality Date  . Knee surgery      I&D- 2008, post  laceration   . Colonoscopy  05/16/2011    Procedure: COLONOSCOPY;  Surgeon: Vertell Novak., MD;  Location: [facility];  Service: Endoscopy;  Laterality: N/A;  . Small intestine surgery  1992, 1999  . Laparotomy and lysis of adhesions    . Total hip arthroplasty  10/25/2011    Procedure: TOTAL HIP ARTHROPLASTY;  Surgeon: Valeria Batman, MD;  Location: [facility] OR;  Service: Orthopedics;  Laterality: Left;  . Appendectomy  1962  . Abdominal hysterectomy  1988  . Partial colectomy and colostomy  sept 2013    mucous fistula done  . Partial colectomy  05/17/2012    Procedure: PARTIAL COLECTOMY;  Surgeon: Adolph Pollack, MD;  Location: [facility];  Service: General;  Laterality: N/A;  . Colostomy closure  05/17/2012    Procedure: COLOSTOMY CLOSURE;  Surgeon: Adolph Pollack, MD;  Location: [facility];  Service: General;  Laterality: N/A;  . Laparotomy  05/17/2012    Procedure: EXPLORATORY LAPAROTOMY;  Surgeon: Adolph Pollack, MD;  Location: [facility];  Service: General;;  . Lysis of adhesion  05/17/2012    Procedure: LYSIS OF ADHESION;  Surgeon: Adolph Pollack, MD;  Location: [facility];  Service: General;;  .  Esophageal biopsy  05/17/2012    Procedure: BIOPSY;  Surgeon: Adolph Pollack, MD;  Location: [facility];  Service: General;;  omental biopsy  . Laparotomy  05/22/2012    Procedure: EXPLORATORY LAPAROTOMY;  Surgeon: Adolph Pollack, MD;  Location: [facility];  Service: General;  Laterality: N/A;  DRAINAGE  INTRA-ABDOMINAL ABSCESS/LYSIS OF ADHESIONSFOR SMALL BOWEL OBSTRUCTION/DIVERTING LOOP ILEOSTOMY   Family History  Problem Relation Age of Onset  . Stroke Father   . Atrial fibrillation Father   . Hypertension Mother    History  Substance Use Topics  . Smoking status: Current Some Day Smoker -- 0.25 packs/day for 20 years    Types: Cigarettes    Last Attempt to Quit: 03/21/2013  . Smokeless tobacco: Never Used  . Alcohol Use: No     Comment: 1 - 2 glasses of wine per week   OB History    Grav Para Term Preterm Abortions TAB SAB Ect Mult Living                 Review of Systems All other systems reviewed and are negative Allergies  Vancomycin; Ativan; Codeine; Tetanus toxoids; Penicillins; and Xarelto  Home Medications   Current Outpatient Rx  Name  Route  Sig  Dispense  Refill  . albuterol (PROVENTIL HFA;VENTOLIN HFA) 108 (90 BASE) MCG/ACT inhaler   Inhalation   Inhale 1-2 puffs into the lungs every 6 (six) hours as needed for wheezing.         Marland Kitchen aspirin 325 MG EC tablet   Oral   Take 325 mg by mouth daily.         . bisoprolol (ZEBETA) 5 MG tablet   Oral   Take 1 tablet (5 mg total) by mouth daily before breakfast.   90 tablet   3   . buPROPion (WELLBUTRIN XL) 300 MG 24 hr tablet   Oral   Take 300 mg by mouth daily before breakfast.          . celecoxib (CELEBREX) 200 MG capsule   Oral   Take 200 mg by mouth daily.         . diphenoxylate-atropine (LOMOTIL) 2.5-0.025 MG per tablet   Oral   Take 1 tablet by mouth 4 (four) times daily as needed for diarrhea or loose stools.          Marland Kitchen esomeprazole (NEXIUM) 40 MG capsule   Oral   Take 40 mg by mouth every morning.          Marland Kitchen estradiol (ESTRACE) 2 MG tablet   Oral   Take 2 mg by mouth daily before breakfast.          . furosemide (LASIX) 40 MG tablet   Oral   Take 40 mg by mouth as needed for fluid or edema.          Marland Kitchen loperamide (IMODIUM) 2 MG capsule   Oral   Take 2 mg by mouth 4 (four) times daily as needed for diarrhea or loose stools.         Marland Kitchen loratadine-pseudoephedrine (CLARITIN-D 12-HOUR) 5-120 MG per tablet   Oral   Take 1 tablet by mouth daily as needed for allergies.          . silver nitrate applicators 75-25 % applicator   Topical   Apply 1 Stick topically every 5 (five) minutes x 3 doses as needed (Please send 4 doses now/Apply to bleeders in abd incision stat).   100 each  0   . tiotropium (SPIRIVA) 18 MCG inhalation capsule   Inhalation   Place 1  capsule (18 mcg total) into inhaler and inhale daily.   30 capsule   0   . benzonatate (TESSALON) 200 MG capsule   Oral   Take 1 capsule (200 mg total) by mouth 3 (three) times daily as needed for cough.   30 capsule   0   . dextromethorphan-guaiFENesin (MUCINEX DM) 30-600 MG per 12 hr tablet   Oral   Take 1 tablet by mouth 2 (two) times daily.   30 tablet   0   . oseltamivir (TAMIFLU) 30 MG capsule   Oral   Take 1 capsule (30 mg total) by mouth 2 (two) times daily.   5 capsule   0   . predniSONE (DELTASONE) 20 MG tablet   Oral   Take 2 tablets (40 mg total) by mouth daily with breakfast.   10 tablet   0     Take for 5 days   . vitamin B-12 2000 MCG tablet   Oral   Take 1 tablet (2,000 mcg total) by mouth daily.   30 tablet   0   . zinc sulfate 220 MG capsule   Oral   Take 1 capsule (220 mg total) by mouth 2 (two) times daily.   30 capsule   0    BP 160/86  Pulse 75  Temp(Src) 97.7 F (36.5 C) (Oral)  Resp 20  Ht 5\' 5"  (1.651 m)  Wt 148 lb 9.4 oz (67.4 kg)  BMI 24.73 kg/m2  SpO2 95% Physical Exam  Nursing note and vitals reviewed. Constitutional: She is oriented to person, place, and time. She appears well-developed and well-nourished. No distress.  HENT:  Head: Normocephalic and atraumatic.  Eyes: Pupils are equal, round, and reactive to light.  Neck: Normal range of motion.  Cardiovascular: Normal rate and intact distal pulses.   Pulmonary/Chest: Tachypnea noted. No respiratory distress. She has wheezes.  Abdominal: Normal appearance. She exhibits no distension.  Musculoskeletal: Normal range of motion.  Neurological: She is alert and oriented to person, place, and time. No cranial nerve deficit.  Skin: Skin is warm and dry. No rash noted.  Psychiatric: She has a normal mood and affect. Her behavior is normal.    ED Course  Procedures (including critical care time) Labs Review Labs Reviewed  RESPIRATORY VIRUS PANEL - Abnormal; Notable for the  following:    Influenza A DETECTED (*)    All other components within normal limits  MRSA PCR SCREENING - Abnormal; Notable for the following:    MRSA by PCR POSITIVE (*)    All other components within normal limits  CBC WITH DIFFERENTIAL - Abnormal; Notable for the following:    RBC 2.61 (*)    Hemoglobin 8.2 (*)    HCT 24.4 (*)    Platelets 122 (*)    Neutrophils Relative % 18 (*)    Monocytes Relative 38 (*)    Neutro Abs 0.8 (*)    Monocytes Absolute 1.7 (*)    All other components within normal limits  COMPREHENSIVE METABOLIC PANEL - Abnormal; Notable for the following:    Sodium 122 (*)    Chloride 90 (*)    CO2 17 (*)    Glucose, Bld 102 (*)    Calcium 7.9 (*)    Albumin 2.9 (*)    Alkaline Phosphatase 1150 (*)    GFR calc non Af Amer 72 (*)  GFR calc Af Amer 84 (*)    All other components within normal limits  URINALYSIS, ROUTINE W REFLEX MICROSCOPIC - Abnormal; Notable for the following:    APPearance CLOUDY (*)    Protein, ur 30 (*)    All other components within normal limits  URINE MICROSCOPIC-ADD ON - Abnormal; Notable for the following:    Casts GRANULAR CAST (*)    All other components within normal limits  COMPREHENSIVE METABOLIC PANEL - Abnormal; Notable for the following:    Sodium 127 (*)    CO2 16 (*)    Glucose, Bld 120 (*)    Calcium 7.3 (*)    Albumin 2.4 (*)    Alkaline Phosphatase 822 (*)    GFR calc non Af Amer 67 (*)    GFR calc Af Amer 77 (*)    All other components within normal limits  MAGNESIUM - Abnormal; Notable for the following:    Magnesium 0.9 (*)    All other components within normal limits  PRO B NATRIURETIC PEPTIDE - Abnormal; Notable for the following:    Pro B Natriuretic peptide (BNP) 9403.0 (*)    All other components within normal limits  CBC WITH DIFFERENTIAL - Abnormal; Notable for the following:    RBC 2.18 (*)    Hemoglobin 7.0 (*)    HCT 20.5 (*)    Platelets 93 (*)    Neutrophils Relative % 28 (*)     Monocytes Relative 33 (*)    Neutro Abs 1.3 (*)    Monocytes Absolute 1.5 (*)    All other components within normal limits  PRO B NATRIURETIC PEPTIDE - Abnormal; Notable for the following:    Pro B Natriuretic peptide (BNP) 9254.0 (*)    All other components within normal limits  INFLUENZA PANEL BY PCR - Abnormal; Notable for the following:    Influenza A By PCR POSITIVE (*)    H1N1 flu by pcr DETECTED (*)    All other components within normal limits  BASIC METABOLIC PANEL - Abnormal; Notable for the following:    Sodium 123 (*)    Chloride 93 (*)    CO2 15 (*)    Glucose, Bld 135 (*)    BUN 26 (*)    Calcium 7.3 (*)    GFR calc non Af Amer 55 (*)    GFR calc Af Amer 63 (*)    All other components within normal limits  MAGNESIUM - Abnormal; Notable for the following:    Magnesium 3.3 (*)    All other components within normal limits  COMPREHENSIVE METABOLIC PANEL - Abnormal; Notable for the following:    Sodium 126 (*)    CO2 15 (*)    Glucose, Bld 137 (*)    BUN 28 (*)    Calcium 7.7 (*)    Albumin 2.3 (*)    Alkaline Phosphatase 778 (*)    GFR calc non Af Amer 55 (*)    GFR calc Af Amer 64 (*)    All other components within normal limits  PRO B NATRIURETIC PEPTIDE - Abnormal; Notable for the following:    Pro B Natriuretic peptide (BNP) 4535.0 (*)    All other components within normal limits  CBC WITH DIFFERENTIAL - Abnormal; Notable for the following:    WBC 2.8 (*)    RBC 2.47 (*)    Hemoglobin 7.8 (*)    HCT 23.2 (*)    RDW 16.6 (*)    Platelets 110 (*)  Monocytes Relative 17 (*)    Neutro Abs 1.5 (*)    All other components within normal limits  COMPREHENSIVE METABOLIC PANEL - Abnormal; Notable for the following:    Sodium 127 (*)    BUN 26 (*)    Albumin 2.5 (*)    Alkaline Phosphatase 779 (*)    Total Bilirubin 0.2 (*)    GFR calc non Af Amer 55 (*)    GFR calc Af Amer 63 (*)    All other components within normal limits  CBC WITH DIFFERENTIAL -  Abnormal; Notable for the following:    WBC 3.0 (*)    RBC 2.71 (*)    Hemoglobin 8.6 (*)    HCT 24.9 (*)    RDW 16.9 (*)    Neutrophils Relative % 25 (*)    Lymphocytes Relative 48 (*)    Monocytes Relative 27 (*)    Neutro Abs 0.8 (*)    All other components within normal limits  CULTURE, BLOOD (ROUTINE X 2)  CULTURE, BLOOD (ROUTINE X 2)  LIPASE, BLOOD  PATHOLOGIST SMEAR REVIEW  HIV ANTIBODY (ROUTINE TESTING)  LEGIONELLA ANTIGEN, URINE  STREP PNEUMONIAE URINARY ANTIGEN  LACTIC ACID, PLASMA  PROCALCITONIN  PHOSPHORUS  MAGNESIUM  PHOSPHORUS  MAGNESIUM  POCT I-STAT TROPONIN I  TYPE AND SCREEN  PREPARE RBC (CROSSMATCH)   Imaging Review No results found.    Date: 03/24/2013  Rate: 132  Rhythm: sinus tachycardia  QRS Axis: normal  Intervals: normal  ST/T Wave abnormalities: normal  Conduction Disutrbances:left anterior fascicular block:        MDM   1. Febrile illness   2. COPD exacerbation   3. Anemia   4. Atrial fibrillation   5. B12 deficiency   6. Chronic ischemic colitis   7. Dehydration   8. HCAP (healthcare-associated pneumonia)   9. Hyponatremia   10. Ischemic colitis   11. Open abdominal wall wound, sequela   12. Acute respiratory failure   13. Atrial fibrillation with rapid ventricular response   14. CMML (chronic myelomonocytic leukemia)   15. Hypomagnesemia   16. Chronic back pain   17. Osteoarthritis of hip   18. Chronic hyponatremia   19. Influenza with pneumonia   20. Anemia of other chronic disease   21. SIADH (syndrome of inappropriate ADH production)   22. Benign hypertensive heart disease without heart failure   23. Pure hypercholesterolemia   24. Diarrhea   25. Partial small bowel obstruction   26. Postoperative anemia due to acute blood loss   27. WBC disease   28. Ileus   29. Elevated liver function tests-chronic   30. Ileostomy status   31. Hypocalcemia   32. Cough   33. Severe sepsis   34. Obstructive chronic  bronchitis with exacerbation   35. Influenza with respiratory manifestations        Nelia Shi, MD 03/28/13 1354

## 2013-03-24 NOTE — ED Notes (Signed)
Pt has a hx of bronchitits and states that she has not been feeling any better she is worried that she may have pneumonia. Pt took zofran at home multiple times and not helping any. Nausea, tacky, sats 92% on ra

## 2013-03-24 NOTE — ED Notes (Signed)
EDP advised of pt fever, verbal to not give tylenol at this time because pt had tylenol approx 2hr PTA

## 2013-03-24 NOTE — ED Notes (Signed)
Pt from home reports that she started to have congestion, cough, N/V x6 days. Pt has taken OTC meds with no relief, progressively getting worse. Pt reports some SOB, but denies CP, diarrhea. Pt is A&O and in NAD. Pt was admitted in Sept 2014 for PNA.

## 2013-03-25 ENCOUNTER — Inpatient Hospital Stay (HOSPITAL_COMMUNITY): Payer: BC Managed Care – PPO

## 2013-03-25 DIAGNOSIS — E222 Syndrome of inappropriate secretion of antidiuretic hormone: Secondary | ICD-10-CM

## 2013-03-25 DIAGNOSIS — C9210 Chronic myeloid leukemia, BCR/ABL-positive, not having achieved remission: Secondary | ICD-10-CM

## 2013-03-25 DIAGNOSIS — J9600 Acute respiratory failure, unspecified whether with hypoxia or hypercapnia: Secondary | ICD-10-CM

## 2013-03-25 DIAGNOSIS — J441 Chronic obstructive pulmonary disease with (acute) exacerbation: Secondary | ICD-10-CM

## 2013-03-25 LAB — CBC WITH DIFFERENTIAL/PLATELET
Basophils Relative: 0 % (ref 0–1)
Eosinophils Absolute: 0 10*3/uL (ref 0.0–0.7)
HCT: 20.5 % — ABNORMAL LOW (ref 36.0–46.0)
Hemoglobin: 7 g/dL — ABNORMAL LOW (ref 12.0–15.0)
Lymphocytes Relative: 39 % (ref 12–46)
MCH: 32.1 pg (ref 26.0–34.0)
MCHC: 34.1 g/dL (ref 30.0–36.0)
Monocytes Absolute: 1.5 10*3/uL — ABNORMAL HIGH (ref 0.1–1.0)
Neutro Abs: 1.3 10*3/uL — ABNORMAL LOW (ref 1.7–7.7)
Platelets: 93 10*3/uL — ABNORMAL LOW (ref 150–400)
WBC: 4.6 10*3/uL (ref 4.0–10.5)

## 2013-03-25 LAB — COMPREHENSIVE METABOLIC PANEL
AST: 19 U/L (ref 0–37)
Albumin: 2.4 g/dL — ABNORMAL LOW (ref 3.5–5.2)
Alkaline Phosphatase: 822 U/L — ABNORMAL HIGH (ref 39–117)
BUN: 17 mg/dL (ref 6–23)
CO2: 16 mEq/L — ABNORMAL LOW (ref 19–32)
Calcium: 7.3 mg/dL — ABNORMAL LOW (ref 8.4–10.5)
Chloride: 96 mEq/L (ref 96–112)
GFR calc non Af Amer: 67 mL/min — ABNORMAL LOW (ref >90)
Glucose, Bld: 120 mg/dL — ABNORMAL HIGH (ref 70–99)
Potassium: 3.8 mEq/L (ref 3.5–5.1)
Total Bilirubin: 0.4 mg/dL (ref 0.3–1.2)

## 2013-03-25 LAB — BASIC METABOLIC PANEL
BUN: 26 mg/dL — ABNORMAL HIGH (ref 6–23)
Creatinine, Ser: 1.06 mg/dL (ref 0.50–1.10)
GFR calc Af Amer: 63 mL/min — ABNORMAL LOW (ref >90)
GFR calc non Af Amer: 55 mL/min — ABNORMAL LOW (ref >90)
Glucose, Bld: 135 mg/dL — ABNORMAL HIGH (ref 70–99)

## 2013-03-25 LAB — INFLUENZA PANEL BY PCR (TYPE A & B): Influenza A By PCR: POSITIVE — AB

## 2013-03-25 LAB — PATHOLOGIST SMEAR REVIEW

## 2013-03-25 LAB — MAGNESIUM: Magnesium: 0.9 mg/dL — CL (ref 1.5–2.5)

## 2013-03-25 LAB — HIV ANTIBODY (ROUTINE TESTING W REFLEX): HIV: NONREACTIVE

## 2013-03-25 LAB — MRSA PCR SCREENING: MRSA by PCR: POSITIVE — AB

## 2013-03-25 MED ORDER — DIPHENOXYLATE-ATROPINE 2.5-0.025 MG PO TABS
1.0000 | ORAL_TABLET | Freq: Four times a day (QID) | ORAL | Status: DC | PRN
  Administered 2013-03-25 – 2013-03-26 (×4): 1 via ORAL
  Filled 2013-03-25 (×4): qty 1

## 2013-03-25 MED ORDER — ZINC SULFATE 220 (50 ZN) MG PO CAPS
220.0000 mg | ORAL_CAPSULE | Freq: Two times a day (BID) | ORAL | Status: DC
  Administered 2013-03-25 – 2013-03-27 (×5): 220 mg via ORAL
  Filled 2013-03-25 (×6): qty 1

## 2013-03-25 MED ORDER — BOOST / RESOURCE BREEZE PO LIQD: 1.0000 | ORAL | Status: DC

## 2013-03-25 MED ORDER — MORPHINE SULFATE 2 MG/ML IJ SOLN
2.0000 mg | INTRAMUSCULAR | Status: DC | PRN
Start: 2013-03-25 — End: 2013-03-27
  Administered 2013-03-25 – 2013-03-27 (×3): 2 mg via INTRAVENOUS
  Filled 2013-03-25 (×3): qty 1

## 2013-03-25 MED ORDER — ADULT MULTIVITAMIN W/MINERALS CH
1.0000 | ORAL_TABLET | Freq: Every day | ORAL | Status: DC
  Administered 2013-03-25 – 2013-03-27 (×3): 1 via ORAL
  Filled 2013-03-25 (×3): qty 1

## 2013-03-25 MED ORDER — VITAMIN B-12 1000 MCG PO TABS
2000.0000 ug | ORAL_TABLET | Freq: Every day | ORAL | Status: DC
  Administered 2013-03-25 – 2013-03-27 (×3): 2000 ug via ORAL
  Filled 2013-03-25 (×3): qty 2

## 2013-03-25 MED ORDER — MUPIROCIN 2 % EX OINT
1.0000 "application " | TOPICAL_OINTMENT | Freq: Two times a day (BID) | CUTANEOUS | Status: DC
  Administered 2013-03-25 – 2013-03-27 (×6): 1 via NASAL
  Filled 2013-03-25: qty 22

## 2013-03-25 MED ORDER — MAGNESIUM SULFATE 40 MG/ML IJ SOLN
2.0000 g | INTRAMUSCULAR | Status: AC
  Administered 2013-03-25 (×3): 2 g via INTRAVENOUS
  Filled 2013-03-25 (×3): qty 50

## 2013-03-25 MED ORDER — OSELTAMIVIR PHOSPHATE 75 MG PO CAPS
75.0000 mg | ORAL_CAPSULE | Freq: Two times a day (BID) | ORAL | Status: DC
  Administered 2013-03-25 (×2): 75 mg via ORAL
  Filled 2013-03-25 (×4): qty 1

## 2013-03-25 MED ORDER — IPRATROPIUM BROMIDE 0.02 % IN SOLN
0.5000 mg | RESPIRATORY_TRACT | Status: DC
  Administered 2013-03-25 – 2013-03-26 (×9): 0.5 mg via RESPIRATORY_TRACT
  Filled 2013-03-25 (×9): qty 2.5

## 2013-03-25 MED ORDER — METOPROLOL TARTRATE 1 MG/ML IV SOLN
5.0000 mg | Freq: Once | INTRAVENOUS | Status: AC
  Administered 2013-03-25: 5 mg via INTRAVENOUS
  Filled 2013-03-25: qty 5

## 2013-03-25 MED ORDER — MAGNESIUM CHLORIDE 64 MG PO TBEC
2.0000 | DELAYED_RELEASE_TABLET | ORAL | Status: DC
  Administered 2013-03-25: 128 mg via ORAL
  Filled 2013-03-25 (×3): qty 2

## 2013-03-25 MED ORDER — JUVEN PO PACK
1.0000 | PACK | ORAL | Status: DC
  Filled 2013-03-25 (×3): qty 1

## 2013-03-25 MED ORDER — LEVALBUTEROL HCL 1.25 MG/0.5ML IN NEBU
1.2500 mg | INHALATION_SOLUTION | RESPIRATORY_TRACT | Status: DC
  Administered 2013-03-25 – 2013-03-26 (×9): 1.25 mg via RESPIRATORY_TRACT
  Filled 2013-03-25 (×13): qty 0.5

## 2013-03-25 MED ORDER — CHLORHEXIDINE GLUCONATE CLOTH 2 % EX PADS
6.0000 | MEDICATED_PAD | Freq: Every day | CUTANEOUS | Status: DC
  Administered 2013-03-25 – 2013-03-27 (×3): 6 via TOPICAL

## 2013-03-25 NOTE — Progress Notes (Signed)
CARE MANAGEMENT NOTE 03/25/2013  Patient:  [name], [name]   Account Number:  [id number]  Date Initiated:  03/25/2013  Documentation initiated by:  DAVIS,RHONDA  Subjective/Objective Assessment:   pt with temp104.0 history of multiple abd surg due to colitis/ hgb 7.0     Action/Plan:   lives at home with children   Anticipated DC Date:  03/28/2013   Anticipated DC Plan:  HOME/SELF CARE  In-house referral  NA      DC Planning Services  NA      [facility] Choice  NA   Choice offered to / List presented to:  NA   DME arranged  NA      DME agency  NA     HH arranged  NA      HH agency  NA   Status of service:  In process, will continue to follow Medicare Important Message given?  NA - LOS <3 / Initial given by admissions (If response is "NO", the following Medicare IM given date fields will be blank) Date Medicare IM given:   Date Additional Medicare IM given:    Discharge Disposition:    Per UR Regulation:  Reviewed for med. necessity/level of care/duration of stay  If discussed at Long Length of Stay Meetings, dates discussed:    Comments:  [id number]/Rhonda Stark Jock, BSN, [place] [phone] Chart Reviewed for discharge and hospital needs. Discharge needs at time of review:  None present will follow for needs. Review of patient progress due on [id number].

## 2013-03-25 NOTE — Consult Note (Signed)
WOC wound consult note Reason for Consult: evaluate abdominal wound.  Pt with history of multiple abdominal surgeries, currently followed by [facility] every 4-6 wks for wound assessment. To be evaluated by Plastics (Dr. Kelly Splinter) for skin grafting at some point when she is well enough Wound type: non healing surgical wound Measurement: 5.5cm x 11cm x 0.2cm  Wound bed: much of the area appears to be reepithelialized, she does have some circular open areas along the periphery of the wound that are open, pale, non granulating. Drainage (amount, consistency, odor) moderate, serosanguinous, slight odor Periwound:intact Dressing procedure/placement/frequency: pt has been using BID NS dressings, would consider silver tx, however surgeon has denied pts request for this and therefore I will continue with the same plan of care. Will use hydrogel as this can used directly on the open areas with less exposure to the intact skin in the wound.  Change daily.   WOC ostomy consult note Stoma type/location:  LLQ, end stoma Stomal assessment/size: oval, budded Peristomal assessment: pouch intact, just changed this am Output liquid and pasty brown green stool in pouch Ostomy pouching: 1pc. Convex pouch in place  Education provided: Pt and caregiver independent with ostomy care.  Supplies ordered for room in case patient needs to change during admission.  Discussed POC with patient and bedside nurse.  Re consult if needed, will not follow at this time. Thanks  Deberah Adolf [employer], CWOCN [phone])

## 2013-03-25 NOTE — Progress Notes (Signed)
INITIAL NUTRITION ASSESSMENT  DOCUMENTATION CODES Per approved criteria  -Not Applicable   INTERVENTION: Provided "Ileostomy Nutrition Therapy" handout from the [employer] Provide Resource Breeze once daily Provide Juven once daily Provide snack once daily Encourage PO intake Provide Multivitamin with minerals daily  NUTRITION DIAGNOSIS: Inadequate oral intake related to poor appetite as evidenced by 6% weight loss in less 3 months.    Goal: Pt to meet >/= 90% of their estimated nutrition needs   Monitor:  PO intake Weight Labs  Reason for Assessment: Consult  62 y.o. female  Admitting Dx: HCAP (healthcare-associated pneumonia)  ASSESSMENT: 63 y.o. WF PMHx ischemic colitis, which necessitated multiple abdominal surgeries, currently has a ileostomy bag, and partially healed surgical site mid abdomen from exploratory laparotomy on 05/22/2012. Hx of CMML, B12 deficiency. Patient was admitted on 9/4 2014 and 9/19 2014 to hospital for bronchitis and atrial fibrillation with RVR respectively. Pt from home reports that she started to have congestion, productive cough (yellow sputum), increased SOB x6 days, nausea x6 days, and fever yesterday.   Pt states she doesn't eat well when she gets sick or has high ileostomy output; states she has been eating poorly for the past week. Per nursing notes pt ate 25% of breakfast this am. Gave pt "Ileostomy Nutrition Therapy" handout from the [employer]; briefly reviewed foods to help reduce ostomy output. Encouraged PO intake, encouraged snacking. Pt's usual body weight is 150 lbs; weight history shows 6% weight loss in less than 3 months.  Pt currently on prescription of Zyvox (Linezolid); pt receive diet education for Low Tyramine diet from RD during previous admission.   Height: Ht Readings from Last 1 Encounters:  03/24/13 5\' 5"  (1.651 m)    Weight: Wt Readings from Last 1 Encounters:   03/24/13 140 lb 10.5 oz (63.8 kg)    Ideal Body Weight: 125 lbs  % Ideal Body Weight: 112%  Wt Readings from Last 10 Encounters:  03/24/13 140 lb 10.5 oz (63.8 kg)  03/12/13 139 lb 6.4 oz (63.231 kg)  02/27/13 137 lb 7 oz (62.341 kg)  02/19/13 141 lb (63.957 kg)  02/08/13 134 lb 3.2 oz (60.873 kg)  01/29/13 138 lb 9.6 oz (62.869 kg)  01/17/13 141 lb 12.8 oz (64.32 kg)  01/11/13 149 lb 14.6 oz (68 kg)  12/24/12 144 lb 12.8 oz (65.681 kg)  12/13/12 150 lb 12.8 oz (68.402 kg)    Usual Body Weight: 150 lbs  % Usual Body Weight: 93%  BMI:  Body mass index is 23.41 kg/(m^2).  Estimated Nutritional Needs: Kcal: 1600-1800 Protein: 75-85 grams Fluid: 1.8- 2 L/day  Skin: stage 2 wound on left posterior buttocks  Diet Order: General  EDUCATION NEEDS: -No education needs identified at this time   Intake/Output Summary (Last 24 hours) at 03/25/13 1048 Last data filed at 03/25/13 1000  Gross per 24 hour  Intake 2694.17 ml  Output   1210 ml  Net 1484.17 ml    Last BM: 12/8 ileostomy  Labs:   Recent Labs Lab 03/24/13 1400 03/25/13 0334  NA 122* 127*  K 4.3 3.8  CL 90* 96  CO2 17* 16*  BUN 14 17  CREATININE 0.84 0.90  CALCIUM 7.9* 7.3*  MG  --  0.9*  GLUCOSE 102* 120*    CBG (last 3)  No results found for this basename: GLUCAP,  in the last 72 hours  Scheduled Meds: . aspirin  325 mg Oral Daily  .  aztreonam  2 g Intravenous Q8H  . bisoprolol  5 mg Oral QAC breakfast  . buPROPion  300 mg Oral QAC breakfast  . celecoxib  200 mg Oral Daily  . Chlorhexidine Gluconate Cloth  6 each Topical Q0600  . dextromethorphan-guaiFENesin  1 tablet Oral BID  . enoxaparin (LOVENOX) injection  40 mg Subcutaneous Q24H  . estradiol  2 mg Oral QAC breakfast  . ipratropium  0.5 mg Nebulization Q4H  . levalbuterol  1.25 mg Nebulization Q4H  . linezolid  600 mg Intravenous Q12H  . magnesium sulfate 1 - 4 g bolus IVPB  2 g Intravenous Q2H  . methylPREDNISolone (SOLU-MEDROL)  injection  80 mg Intravenous Q24H  . multivitamin with minerals  1 tablet Oral Daily  . mupirocin ointment  1 application Nasal BID  . pantoprazole  80 mg Oral Daily  . vitamin B-12  2,000 mcg Oral Daily  . zinc sulfate  220 mg Oral BID    Continuous Infusions: . sodium chloride 10 mL/hr at 03/25/13 1610    Past Medical History  Diagnosis Date  . Hypertension     She has a past hx of essential  . 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 Inflammation of joint of knee     Since we last last saw her she developed problems with an acute which required surgical drainage by her orthopedist Dr. Cleophas Dunker.  Marland Kitchen MRSA (methicillin resistant Staphylococcus aureus)     Knee surgery drainage was positive for MRSA and she was treated with 3 weeks of doxycycline successfully.  . Diarrhea     Mild  . Exogenous obesity   . GERD (gastroesophageal reflux disease)     2 hosp.- ischemic colitis - residual of Norovirus, 05/2011- sm. bowel obstruction  . Headache(784.0)     migraine headache on occas, less now than when she was younger   . Arthritis     L hip, back, neck   . History of blood transfusion sept 2013    04/2011- /w ischemic colitis , trouble with matching blood  sept 2013  . Anemia     will see hematology consult prior to surgery, recommended by Dr. Patty Sermons  . Anemia 12/15/2010  . Ischemic colitis 01/31/2012  . Atrial flutter     during hospitalization, 04/2011- related to anemia & illness/stress   . Pneumonia     04/2011- not hospitalized , pt. denies SOB, changes in chest, breathing  . 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 Dizziness     occasional  . B12 deficiency 02/27/2013    Past Surgical History  Procedure Laterality Date  . Knee surgery      I&D- 2008, post laceration   . Colonoscopy  05/16/2011    Procedure: COLONOSCOPY;  Surgeon: Vertell Novak., MD;  Location: [facility];   Service: Endoscopy;  Laterality: N/A;  . Small intestine surgery  1992, 1999  . Laparotomy and lysis of adhesions    . Total hip arthroplasty  10/25/2011    Procedure: TOTAL HIP ARTHROPLASTY;  Surgeon: Valeria Batman, MD;  Location: [facility] OR;  Service: Orthopedics;  Laterality: Left;  . Appendectomy  1962  . Abdominal hysterectomy  1988  . Partial colectomy and colostomy  sept 2013    mucous fistula done  . Partial colectomy  05/17/2012    Procedure: PARTIAL COLECTOMY;  Surgeon: Adolph Pollack, MD;  Location: [facility];  Service: General;  Laterality: N/A;  . Colostomy closure  05/17/2012    Procedure: COLOSTOMY CLOSURE;  Surgeon: Adolph Pollack, MD;  Location: [facility];  Service: General;  Laterality: N/A;  . Laparotomy  05/17/2012    Procedure: EXPLORATORY LAPAROTOMY;  Surgeon: Adolph Pollack, MD;  Location: [facility];  Service: General;;  . Lysis of adhesion  05/17/2012    Procedure: LYSIS OF ADHESION;  Surgeon: Adolph Pollack, MD;  Location: [facility];  Service: General;;  . Esophageal biopsy  05/17/2012    Procedure: BIOPSY;  Surgeon: Adolph Pollack, MD;  Location: [facility];  Service: General;;  omental biopsy  . Laparotomy  05/22/2012    Procedure: EXPLORATORY LAPAROTOMY;  Surgeon: Adolph Pollack, MD;  Location: [facility];  Service: General;  Laterality: N/A;  DRAINAGE  INTRA-ABDOMINAL ABSCESS/LYSIS OF ADHESIONSFOR SMALL BOWEL OBSTRUCTION/DIVERTING LOOP ILEOSTOMY    Ian Malkin RD, LDN Inpatient Clinical Dietitian Pager: [phone] After Hours Pager: [phone]

## 2013-03-25 NOTE — Progress Notes (Signed)
[facility] PROGRESS NOTE  [name] ZOX:[id number] DOB: [birth year] DOA: 03/24/2013 PCP: Cassell Clement, MD  Assessment/Plan  HCAP with very high fever, recurrent pneumonia -Cont Aztreonam + Linezolid day #2 -  F/u blood cultures -  Strep pneumo neg, legionella pending -  Sputum culture -  Mucinex DM  -  tessalon -  Add morphine prn refractory cough -  F/u respiratory panel -  Check influenza  Fevers up to 104.62F, likely secondary to HCAP.  UA neg and abdomen stable -  Tylenol as needed -  D/c cooling blanket  Acute COPD/asthma exac - Cont methylprednisolone 80 mg IV once daily today day #2 -O2 via nasal cannula to maintain SpO2> 92%  -D/c albuterol -  Start xopenex -  Continue atrovent for now, but if tachycardia persistent, will d/c  Hyponatremia due to SIADH, currently at baseline  125- 133  Dehydration resolving.  Encouraged good oral intake today  + gap metabolic acidosis which is chronic and due to ileostomy, at baseline.    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, worsened today likely due to acute illness -will try to transfuse 1 unit PRBC, however patient has many antibodies  Atrial fibrillation with RVR, happens commonly with acute infection and fever -  Treat infection and fever -  Minimize medications which can worsen tachycardia -  Will defer cardizem for now because previously made her hypotensive at last admission -  Metoprolol x 1 and given bisoprolol this AM -  Transfusion as above -  If HR not responding, will start cardizem gtt cautiously -  Bedrest until HR trending down  Diastolic heart failure, chronic, but at risk for decompensation given IVF.  Last ECHO a few months ago with preserved EF and grade 1 DD -proBNP mildly elevated, stable from prior  Chronic ischemic colitis Patient's abdominal exam is not consistent with colitis   Elevated alkaline phosphatase chronically elevated and likely higher because she was dehydrated, febrile and sedentary.   Previous fractionation was primarily from bone  Open abdominal wall wound/Poor wound healing, gradually improving, no recent changes -Consult wound care - Start zinc   CMML, chronic stable.  Per oncology.  Vitamin B12 deficiency, last checked a few months ago and still borderline low -  Restart Vit B12 oral supplementation  Hypomagnesemia -  Give magnesium 6gm now -  Repeat magnesium after infusion complete.  Once near 2, will continue to administer 2gm IV once daily for two more days  Diet:  Regular for now, then healthy heart once starting to eat again Access:  PIV IVF:  KVO Proph:  lovenox  Code Status: Full Family Communication: patient alone Disposition Plan: pending improvement in HR, fever, sepsis, HCAP   Consultants:  None  Procedures:  CXR/KUB  Antibiotics:  linezolid 12/7 >>  Aztreonam 12/7 >>   HPI/Subjective:  States her breathing is already some better this morning, hwoever, she feels like she has a lot of phlegm rattling in her chest.  Fevers are now better.  She does not feel well, but doesn't feel terrible.  Abdominal pain is stable and wound is unchanged.  Denies nausea.  Stools have been stable although decreased because not eating recently.      Objective: Filed Vitals:   03/25/13 0400 03/25/13 0500 03/25/13 0600 03/25/13 0618  BP: 124/74   120/70  Pulse: 100 99 132 49  Temp: 98.1 F (36.7 C)     TempSrc: Rectal     Resp: 17 23 18 23   Height:  Weight:      SpO2: 100% 100% 100% 84%    Intake/Output Summary (Last 24 hours) at 03/25/13 0724 Last data filed at 03/25/13 1610  Gross per 24 hour  Intake 2176.25 ml  Output    785 ml  Net 1391.25 ml   Filed Weights   03/24/13 1749  Weight: 63.8 kg (140 lb 10.5 oz)    Exam:   General:  CF, No acute distress  HEENT:  NCAT, MMM  Cardiovascular:  IRRR rate in 130s, nl S1, S2 no obvious mrg, 2+ pulses, warm extremities  Respiratory:  Full exp wheeze with rhonchi, no focal rales,  diminished throughout, no increased WOB  Abdomen:   NABS, soft, NT/ND, ileostomy with green liquid.  Abdominal wound with health appearing granulation tissue, no discharge or odor or surrounding erythema.    MSK:   Normal tone and bulk, no LEE  Neuro:  Grossly intact  Data Reviewed: Basic Metabolic Panel:  Recent Labs Lab 03/24/13 1400 03/25/13 0334  NA 122* 127*  K 4.3 3.8  CL 90* 96  CO2 17* 16*  GLUCOSE 102* 120*  BUN 14 17  CREATININE 0.84 0.90  CALCIUM 7.9* 7.3*  MG  --  0.9*   Liver Function Tests:  Recent Labs Lab 03/24/13 1400 03/25/13 0334  AST 27 19  ALT 31 21  ALKPHOS 1150* 822*  BILITOT 0.5 0.4  PROT 7.4 6.3  ALBUMIN 2.9* 2.4*    Recent Labs Lab 03/24/13 1400  LIPASE 13   No results found for this basename: AMMONIA,  in the last 168 hours CBC:  Recent Labs Lab 03/24/13 1400 03/25/13 0334  WBC 4.5 4.6  NEUTROABS 0.8* 1.3*  HGB 8.2* 7.0*  HCT 24.4* 20.5*  MCV 93.5 94.0  PLT 122* PENDING   Cardiac Enzymes: No results found for this basename: CKTOTAL, CKMB, CKMBINDEX, TROPONINI,  in the last 168 hours BNP (last 3 results)  Recent Labs  01/11/13 0830 03/24/13 2031 03/25/13 0334  PROBNP 5074.0* 9254.0* 9403.0*   CBG: No results found for this basename: GLUCAP,  in the last 168 hours  Recent Results (from the past 240 hour(s))  MRSA PCR SCREENING     Status: Abnormal   Collection Time    03/24/13  8:05 PM      Result Value Range Status   MRSA by PCR POSITIVE (*) NEGATIVE Final   Comment:            The GeneXpert MRSA Assay (FDA     approved for NASAL specimens     only), is one component of a     comprehensive MRSA colonization     surveillance program. It is not     intended to diagnose MRSA     infection nor to guide or     monitor treatment for     MRSA infections.     CRITICAL RESULT CALLED TO, READ BACK BY AND VERIFIED WITH:     SPOKE WITH STERN,R RN [phone] HAMER,N      Studies: Dg Abd Acute  W/chest  03/24/2013   CLINICAL DATA:  Shortness of breath, abdominal pain, nausea  EXAM: ACUTE ABDOMEN SERIES (ABDOMEN 2 VIEW & CHEST 1 VIEW)  COMPARISON:  Chest radiographs dated 01/17/2013. CT abdomen pelvis dated 05/22/2012.  FINDINGS: Chronic interstitial markings/ emphysematous. Mild right basilar opacities, likely atelectasis. Left lower lobe opacity has resolved. No pleural effusion or pneumothorax.  Nonspecific bowel gas pattern without disproportionate small bowel dilatation to suggest small  bowel obstruction. Surgical sutures in the right mid abdomen.  No evidence of free air under the diaphragm on the upright view.  Degenerative changes of the lumbar spine with levoscoliosis.  Left hip arthroplasty.  IMPRESSION: Mild right basilar opacity, likely atelectasis. Prior left lower lobe opacity has resolved.  No findings to suggest small bowel obstruction or free air.   Electronically Signed   By: Charline Bills M.D.   On: 03/24/2013 14:07    Scheduled Meds: . aspirin  325 mg Oral Daily  . aztreonam  2 g Intravenous Q8H  . bisoprolol  5 mg Oral QAC breakfast  . buPROPion  300 mg Oral QAC breakfast  . celecoxib  200 mg Oral Daily  . Chlorhexidine Gluconate Cloth  6 each Topical Q0600  . dextromethorphan-guaiFENesin  1 tablet Oral BID  . enoxaparin (LOVENOX) injection  40 mg Subcutaneous Q24H  . estradiol  2 mg Oral QAC breakfast  . ipratropium  0.5 mg Nebulization Q4H  . levalbuterol  1.25 mg Nebulization Q4H  . linezolid  600 mg Intravenous Q12H  . magnesium sulfate 1 - 4 g bolus IVPB  2 g Intravenous Q2H  . methylPREDNISolone (SOLU-MEDROL) injection  80 mg Intravenous Q24H  . mupirocin ointment  1 application Nasal BID  . pantoprazole  80 mg Oral Daily   Continuous Infusions: . sodium chloride 75 mL/hr at 03/25/13 0230    Principal Problem:   HCAP (healthcare-associated pneumonia) Active Problems:   Anemia   Chronic back pain   Atrial fibrillation   Chronic ischemic colitis    Osteoarthritis of hip   Chronic hyponatremia   CMML (chronic myelomonocytic leukemia)   Ischemic colitis   Open abdominal wall wound   Anemia of other chronic disease   Febrile illness    Time spent: 30 min    Jannifer Fischler, [facility]  [facility] Pager [phone]. If 7PM-7AM, please contact night-coverage at [email or web address], password [facility] 03/25/2013, 7:24 AM  LOS: 1 day

## 2013-03-26 LAB — COMPREHENSIVE METABOLIC PANEL
ALT: 18 U/L (ref 0–35)
Albumin: 2.3 g/dL — ABNORMAL LOW (ref 3.5–5.2)
Alkaline Phosphatase: 778 U/L — ABNORMAL HIGH (ref 39–117)
BUN: 28 mg/dL — ABNORMAL HIGH (ref 6–23)
CO2: 15 mEq/L — ABNORMAL LOW (ref 19–32)
Calcium: 7.7 mg/dL — ABNORMAL LOW (ref 8.4–10.5)
Chloride: 97 mEq/L (ref 96–112)
GFR calc Af Amer: 64 mL/min — ABNORMAL LOW (ref >90)
GFR calc non Af Amer: 55 mL/min — ABNORMAL LOW (ref >90)
Glucose, Bld: 137 mg/dL — ABNORMAL HIGH (ref 70–99)
Potassium: 3.8 mEq/L (ref 3.5–5.1)
Sodium: 126 mEq/L — ABNORMAL LOW (ref 135–145)
Total Protein: 6.6 g/dL (ref 6.0–8.3)

## 2013-03-26 LAB — CBC WITH DIFFERENTIAL/PLATELET
Basophils Absolute: 0 10*3/uL (ref 0.0–0.1)
Eosinophils Absolute: 0 10*3/uL (ref 0.0–0.7)
Eosinophils Relative: 0 % (ref 0–5)
Hemoglobin: 7.8 g/dL — ABNORMAL LOW (ref 12.0–15.0)
Lymphs Abs: 0.8 10*3/uL (ref 0.7–4.0)
MCH: 31.6 pg (ref 26.0–34.0)
MCHC: 33.6 g/dL (ref 30.0–36.0)
MCV: 93.9 fL (ref 78.0–100.0)
Monocytes Absolute: 0.5 10*3/uL (ref 0.1–1.0)
Neutrophils Relative %: 53 % (ref 43–77)
Platelets: 110 10*3/uL — ABNORMAL LOW (ref 150–400)
RBC: 2.47 MIL/uL — ABNORMAL LOW (ref 3.87–5.11)
RDW: 16.6 % — ABNORMAL HIGH (ref 11.5–15.5)

## 2013-03-26 LAB — RESPIRATORY VIRUS PANEL
Adenovirus: NOT DETECTED
Influenza A H3: NOT DETECTED
Influenza A: DETECTED — AB
Influenza B: NOT DETECTED
Parainfluenza 1: NOT DETECTED
Parainfluenza 2: NOT DETECTED
Parainfluenza 3: NOT DETECTED

## 2013-03-26 LAB — PHOSPHORUS: Phosphorus: 3.7 mg/dL (ref 2.3–4.6)

## 2013-03-26 LAB — LEGIONELLA ANTIGEN, URINE

## 2013-03-26 LAB — MAGNESIUM: Magnesium: 2.5 mg/dL (ref 1.5–2.5)

## 2013-03-26 MED ORDER — OSELTAMIVIR PHOSPHATE 6 MG/ML PO SUSR
30.0000 mg | Freq: Two times a day (BID) | ORAL | Status: DC
  Administered 2013-03-26 – 2013-03-27 (×3): 30 mg via ORAL
  Filled 2013-03-26 (×5): qty 5

## 2013-03-26 MED ORDER — NICOTINE 7 MG/24HR TD PT24
7.0000 mg | MEDICATED_PATCH | Freq: Every day | TRANSDERMAL | Status: DC
  Administered 2013-03-26: 7 mg via TRANSDERMAL
  Filled 2013-03-26 (×2): qty 1

## 2013-03-26 MED ORDER — IPRATROPIUM BROMIDE 0.02 % IN SOLN
0.5000 mg | Freq: Four times a day (QID) | RESPIRATORY_TRACT | Status: DC
  Administered 2013-03-27: 0.5 mg via RESPIRATORY_TRACT
  Filled 2013-03-26 (×3): qty 2.5

## 2013-03-26 MED ORDER — PREDNISONE 50 MG PO TABS
60.0000 mg | ORAL_TABLET | Freq: Every day | ORAL | Status: DC
  Administered 2013-03-26 – 2013-03-27 (×2): 60 mg via ORAL
  Filled 2013-03-26 (×3): qty 1

## 2013-03-26 MED ORDER — BISOPROLOL FUMARATE 10 MG PO TABS
10.0000 mg | ORAL_TABLET | Freq: Every day | ORAL | Status: DC
  Administered 2013-03-26 – 2013-03-27 (×2): 10 mg via ORAL
  Filled 2013-03-26 (×3): qty 1

## 2013-03-26 MED ORDER — LEVALBUTEROL HCL 1.25 MG/0.5ML IN NEBU
1.2500 mg | INHALATION_SOLUTION | Freq: Four times a day (QID) | RESPIRATORY_TRACT | Status: DC
  Administered 2013-03-27: 08:00:00 1.25 mg via RESPIRATORY_TRACT
  Filled 2013-03-26 (×6): qty 0.5

## 2013-03-26 NOTE — Progress Notes (Signed)
ANTIBIOTIC CONSULT NOTE  Pharmacy Consult for antibiotic renal dose adjustment  Indication: Influenza A positive  Allergies  Allergen Reactions  . Vancomycin Hives and Rash    ? wheezing  . Ativan [Lorazepam]     confusion  . Codeine Nausea And Vomiting  . Tetanus Toxoids Other (See Comments)    serum  . Penicillins Hives and Rash  . Xarelto [Rivaroxaban] Hives and Rash    ?    Patient Measurements: Height: 5\' 5"  (165.1 cm) Weight: 148 lb 9.4 oz (67.4 kg) IBW/kg (Calculated) : 57  Vital Signs: Temp: 98 F (36.7 C) (12/09 0400) Temp src: Oral (12/09 0400) BP: 157/86 mmHg (12/09 0400) Pulse Rate: 111 (12/09 0700) Intake/Output from previous day: 12/08 0701 - 12/09 0700 In: 3146.5 [P.O.:1080; I.V.:854; Blood:312.5; IV Piggyback:900] Out: 3385 [Urine:2950; Stool:435]  Labs:  Recent Labs  03/24/13 1400 03/25/13 0334 03/25/13 1517 03/26/13 0414  WBC 4.5 4.6  --  2.8*  HGB 8.2* 7.0*  --  7.8*  PLT 122* 93*  --  110*  CREATININE 0.84 0.90 1.06 1.05   Estimated Creatinine Clearance: 49.3 ml/min (by C-G formula based on Cr of 1.05).  Microbiology: Recent Results (from the past 720 hour(s))  TECHNOLOGIST REVIEW     Status: None   Collection Time    02/27/13  2:17 PM      Result Value Range Status   Technologist Review Variant lymphs present, Rare Meta   Final  CULTURE, BLOOD (ROUTINE X 2)     Status: None   Collection Time    03/24/13  4:45 PM      Result Value Range Status   Specimen Description BLOOD LEFT HAND   Final   Special Requests BOTTLES DRAWN AEROBIC ONLY 2CC   Final   Culture  Setup Time     Final   Value: 03/24/2013 20:03     Performed at [facility]   Culture     Final   Value:        BLOOD CULTURE RECEIVED NO GROWTH TO DATE CULTURE WILL BE HELD FOR 5 DAYS BEFORE ISSUING A FINAL NEGATIVE REPORT     Performed at [facility]   Report Status PENDING   Incomplete  CULTURE, BLOOD (ROUTINE X 2)     Status: None   Collection Time    03/24/13  5:01 PM      Result Value Range Status   Specimen Description BLOOD LEFT HAND   Final   Special Requests BOTTLES DRAWN AEROBIC ONLY 1CC   Final   Culture  Setup Time     Final   Value: 03/24/2013 20:03     Performed at [facility]   Culture     Final   Value:        BLOOD CULTURE RECEIVED NO GROWTH TO DATE CULTURE WILL BE HELD FOR 5 DAYS BEFORE ISSUING A FINAL NEGATIVE REPORT     Performed at [facility]   Report Status PENDING   Incomplete  MRSA PCR SCREENING     Status: Abnormal   Collection Time    03/24/13  8:05 PM      Result Value Range Status   MRSA by PCR POSITIVE (*) NEGATIVE Final   Comment:            The GeneXpert MRSA Assay (FDA     approved for NASAL specimens     only), is one component of a     comprehensive MRSA  colonization     surveillance program. It is not     intended to diagnose MRSA     infection nor to guide or     monitor treatment for     MRSA infections.     CRITICAL RESULT CALLED TO, READ BACK BY AND VERIFIED WITH:     SPOKE WITH STERN,R RN [phone] [facility]     Anti-infectives   Start     Dose/Rate Route Frequency Ordered Stop   03/25/13 1330  oseltamivir (TAMIFLU) capsule 75 mg     75 mg Oral 2 times daily 03/25/13 1258 03/30/13 0959   03/24/13 2200  linezolid (ZYVOX) IVPB 600 mg  Status:  Discontinued     600 mg 300 mL/hr over 60 Minutes Intravenous Every 12 hours 03/24/13 1915 03/26/13 0701   03/24/13 2000  aztreonam (AZACTAM) 2 g in dextrose 5 % 50 mL IVPB  Status:  Discontinued     2 g 100 mL/hr over 30 Minutes Intravenous 3 times per day 03/24/13 1803 03/26/13 0701   03/24/13 1800  ciprofloxacin (CIPRO) IVPB 400 mg  Status:  Discontinued     400 mg 200 mL/hr over 60 Minutes Intravenous  Once 03/24/13 1749 03/24/13 1755      Assessment: 62 yoF admitted 12/7 with suspected pneumonia and started on broad spectrum antibiotics.  Tamiflu added 12/8 for Influenza A POSITIVE result, and linezolid/aztreonam were  d/c on 12/9.  Pharmacy is following for renal dose adjustments of antimicrobials.  Day #2 oseltamivir 75mg  PO BID - 2 doses given.  Tmax: Afebrile  WBCs: 2.8 (12/7 >> solumedrol >> 12/9 >> prednisone )  Renal: SCr 1.05 with CrCl ~ 49 ml/min  New renal dosing recommendations for oseltamivir: CrCl >30 to 60 mL/minute: 30 mg twice daily for 5 days  Goal of Therapy:  Appropriate abx dosing, eradication of infection.   Plan:   Decrease to oseltamivir 30mg  PO BID x8 more doses (total of 5 days)  Follow up renal fxn and culture results.   Lynann Beaver PharmD, BCPS Pager [phone] 03/26/2013 7:39 AM

## 2013-03-26 NOTE — Progress Notes (Addendum)
[facility] PROGRESS NOTE  [name] YNW:[id number] DOB: 1951-04-11 DOA: 03/24/2013 PCP: Cassell Clement, MD  [name] is a 62 y.o. WF with history of ischemic colitis which necessitated multiple abdominal surgeries.  She currently has a ileostomy bag, and partially healed surgical site mid abdomen from exploratory laparotomy performed by Dr. Avel Peace on 05/22/2012, per Dr.Gorsuch, Ni (oncology) possible.CMML, B12 deficiency. Patient was admitted on 9/4 2014 and 9/19 2014 to hospital for bronchitis and atrial fibrillation with RVR respectively.  Presented with congestion, productive cough (yellow sputum), increased SOB x6 days, nausea x6 days, and fever.  Started on tx for HCAP, however, she was flu positive.  D/c'd abx and continued tamiflu.  Received one blood transfusion.    Assessment/Plan  HCAP with very high fever, due to influenza -  D/c Aztreonam + Linezolid as cultures are negative. -  blood cultures NGTD -  Strep pneumo neg, legionella pending -  Not producing sputum -  Mucinex DM  -  tessalon -  Add morphine prn refractory cough -  F/u respiratory panel -  Tamiflu day 2  Fevers up to 104.91F, likely secondary to influenza.  UA neg and abdomen stable -  Tylenol as needed  Acute COPD/asthma exac - Fast prednisone taper - O2 via nasal cannula to maintain SpO2> 92%  - Continue xopenex/ipra  Hyponatremia due to SIADH, currently at baseline  125- 133  Dehydration resolved.  Encouraged good oral intake today -  D/c IVF  + gap metabolic acidosis which is chronic and due to ileostomy, at baseline.    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  Last ECHO a few months ago with preserved EF and grade 1 DD -proBNP mildly elevated but decreasing.  Chronic ischemic colitis Patient's abdominal exam is not consistent with colitis   Elevated alkaline phosphatase chronically elevated and likely higher because she was dehydrated, febrile and sedentary.  Previous fractionation was primarily from bone  Open abdominal wall wound/Poor wound healing, gradually improving, no recent changes -Consult wound care - Started zinc   CMML, chronic stable.  Per oncology.  Vitamin B12 deficiency, last checked a few months ago and still borderline low -  Restarted Vit B12 oral supplementation  Hypomagnesemia, resolved with IV supplementation  Leukopenia, likely reactive from viral infection  Thrombocytopenia, likely to marrow suppression from CMML and flu, stable.   -  Repeat CBC in am  Diet:  Regular for now, then healthy heart once starting to eat again Access:  PIV IVF:  KVO Proph:  lovenox  Code Status: Full Family Communication: patient alone Disposition Plan:  Monitor off of antibiotics and on monotherapy tamiflu, OOB today.  If feeling better, possible discharge soon   Consultants:  None  Procedures:  CXR/KUB  Antibiotics:  linezolid 12/7 >> 12/9  Aztreonam 12/7 >> 12/9  HPI/Subjective:  Feeling a little wheezy this morning.  Voided frequently overnight.  No further fevers and feeling better this morning, just tired from trying to void.  Hard to get all urine out in bedpan.     Objective: Filed Vitals:   03/26/13 0300 03/26/13 0400 03/26/13 0500 03/26/13 0600  BP:  157/86    Pulse: 109 82 88 110  Temp:  98 F (36.7 C)    TempSrc:  Oral    Resp: 14 11 14 16   Height:      Weight:  67.4 kg (148 lb 9.4 oz)    SpO2: 100% 100% 100% 100%    Intake/Output Summary (Last 24 hours) at 03/26/13 0659 Last data filed at 03/26/13 0600  Gross per 24 hour  Intake  3146.5 ml  Output   3085 ml  Net   61.5 ml   Filed Weights   03/24/13 1749 03/26/13 0400  Weight: 63.8 kg (140 lb 10.5 oz) 67.4 kg (148 lb 9.4 oz)    Exam:   General:  CF, No acute distress  HEENT:  NCAT, MMM  Cardiovascular:  IRRR rate in 130s, nl S1, S2 no obvious mrg, 2+ pulses, warm extremities  Respiratory:  Full exp wheeze with occasional rhonchi, some rales heard over right chest, diminished throughout, no increased WOB  Abdomen:   NABS, soft, NT/ND, ileostomy with green liquid.  Abdominal wound with health appearing granulation tissue, no discharge or odor or surrounding erythema.    MSK:   Normal tone and bulk, no LEE  Neuro:  Grossly intact  Data Reviewed: Basic Metabolic Panel:  Recent Labs Lab 03/24/13 1400 03/25/13 0334 03/25/13 1517 03/26/13 0414  NA 122* 127* 123* 126*  K 4.3 3.8 4.7 3.8  CL 90* 96 93* 97  CO2 17* 16* 15* 15*  GLUCOSE 102* 120* 135* 137*  BUN 14 17 26* 28*  CREATININE 0.84 0.90 1.06 1.05  CALCIUM 7.9* 7.3* 7.3* 7.7*  MG  --  0.9* 3.3* 2.5  PHOS  --   --  4.5 3.7   Liver Function Tests:  Recent Labs Lab 03/24/13 1400 03/25/13 0334 03/26/13 0414  AST 27 19 15   ALT 31 21 18   ALKPHOS 1150* 822* 778*  BILITOT 0.5 0.4 0.3  PROT 7.4 6.3 6.6  ALBUMIN 2.9* 2.4* 2.3*    Recent Labs Lab 03/24/13 1400  LIPASE 13   No results found for this basename: AMMONIA,  in the last 168 hours CBC:  Recent Labs Lab 03/24/13 1400 03/25/13 0334 03/26/13 0414  WBC 4.5 4.6 2.8*  NEUTROABS 0.8* 1.3* 1.5*  HGB 8.2* 7.0* 7.8*  HCT 24.4* 20.5* 23.2*  MCV 93.5 94.0 93.9  PLT 122* 93* 110*   Cardiac Enzymes: No results found for this basename: CKTOTAL, CKMB, CKMBINDEX, TROPONINI,  in the last 168 hours BNP (last 3 results)  Recent Labs  03/24/13 2031 03/25/13 0334 03/26/13 0414  PROBNP 9254.0* 9403.0* 4535.0*   CBG: No results found for this basename: GLUCAP,  in the last 168 hours  Recent Results (from the past 240 hour(s))   CULTURE, BLOOD (ROUTINE X 2)     Status: None   Collection Time    03/24/13  4:45 PM      Result Value Range Status   Specimen Description BLOOD LEFT HAND   Final   Special Requests BOTTLES DRAWN AEROBIC ONLY 2CC   Final   Culture  Setup Time     Final   Value: 03/24/2013 20:03     Performed at [facility]   Culture     Final   Value:        BLOOD CULTURE RECEIVED NO GROWTH TO DATE CULTURE WILL BE HELD FOR 5 DAYS BEFORE ISSUING A FINAL NEGATIVE REPORT     Performed at [facility]   Report Status PENDING   Incomplete  CULTURE, BLOOD (ROUTINE X 2)     Status: None   Collection Time    03/24/13  5:01 PM      Result Value Range Status   Specimen Description BLOOD LEFT HAND   Final   Special Requests BOTTLES DRAWN AEROBIC ONLY 1CC   Final   Culture  Setup Time     Final   Value: 03/24/2013 20:03     Performed at [facility]   Culture     Final   Value:        BLOOD CULTURE RECEIVED NO GROWTH TO DATE CULTURE WILL BE HELD FOR 5 DAYS BEFORE ISSUING A FINAL NEGATIVE REPORT     Performed at [facility]   Report Status PENDING   Incomplete  MRSA PCR SCREENING     Status: Abnormal   Collection Time    03/24/13  8:05 PM      Result Value Range Status   MRSA by PCR POSITIVE (*) NEGATIVE Final   Comment:            The GeneXpert MRSA Assay (FDA     approved for NASAL specimens     only), is one component of a     comprehensive MRSA colonization     surveillance program. It is not     intended to diagnose MRSA     infection nor to guide or     monitor treatment for     MRSA infections.     CRITICAL RESULT CALLED TO, READ BACK BY AND VERIFIED WITH:     SPOKE WITH STERN,R RN [phone] [facility]      Studies: Dg Chest Port 1 View  03/25/2013   CLINICAL DATA:  Cough.  EXAM: PORTABLE CHEST - 1 VIEW  COMPARISON:  03/24/2013  FINDINGS: The heart size is within normal limits. There is stable tortuosity of the thoracic aorta. Mild bibasilar atelectasis  present. Lungs are otherwise clear. The visualized skeletal structures are unremarkable.  IMPRESSION: Mild bibasilar atelectasis.  No focal infiltrate identified.   Electronically Signed   By: Irish Lack M.D.   On: 03/25/2013 08:42   Dg Abd Acute W/chest  03/24/2013   CLINICAL DATA:  Shortness of breath, abdominal pain, nausea  EXAM: ACUTE ABDOMEN SERIES (ABDOMEN 2 VIEW & CHEST 1 VIEW)  COMPARISON:  Chest radiographs dated 01/17/2013. CT abdomen pelvis dated 05/22/2012.  FINDINGS: Chronic interstitial markings/ emphysematous. Mild right basilar opacities, likely atelectasis. Left lower lobe opacity has resolved. No pleural effusion or pneumothorax.  Nonspecific bowel gas pattern without disproportionate small bowel dilatation to suggest small bowel obstruction. Surgical sutures in the right mid abdomen.  No evidence of free air under the diaphragm on the upright view.  Degenerative changes of the lumbar spine with levoscoliosis.  Left hip arthroplasty.  IMPRESSION: Mild right basilar opacity, likely atelectasis. Prior left lower lobe opacity has resolved.  No findings to suggest small bowel obstruction or free air.   Electronically Signed   By: Charline Bills M.D.   On: 03/24/2013 14:07    Scheduled Meds: . aspirin  325 mg Oral Daily  . aztreonam  2 g Intravenous Q8H  . bisoprolol  5 mg Oral QAC breakfast  . buPROPion  300 mg Oral QAC breakfast  . celecoxib  200 mg Oral Daily  . Chlorhexidine Gluconate Cloth  6 each Topical Q0600  . dextromethorphan-guaiFENesin  1 tablet Oral BID  .  estradiol  2 mg Oral QAC breakfast  . feeding supplement (RESOURCE BREEZE)  1 Container Oral Q24H  . ipratropium  0.5 mg Nebulization Q4H  . levalbuterol  1.25 mg Nebulization Q4H  . linezolid  600 mg Intravenous Q12H  . multivitamin with minerals  1 tablet Oral Daily  . mupirocin ointment  1 application Nasal BID  . nutrition supplement (JUVEN)  1 packet Oral Q24H  . oseltamivir  75 mg Oral BID  .  pantoprazole  80 mg Oral Daily  . predniSONE  60 mg Oral Q breakfast  . vitamin B-12  2,000 mcg Oral Daily  . zinc sulfate  220 mg Oral BID   Continuous Infusions: . sodium chloride 75 mL/hr at 03/25/13 1959    Principal Problem:   HCAP (healthcare-associated pneumonia) Active Problems:   Anemia   Chronic back pain   Atrial fibrillation   Chronic ischemic colitis   Osteoarthritis of hip   Chronic hyponatremia   CMML (chronic myelomonocytic leukemia)   Ischemic colitis   Open abdominal wall wound   Anemia of other chronic disease   Febrile illness   SIADH (syndrome of inappropriate ADH production)    Time spent: 30 min    Vaniya Augspurger  [facility] Pager [phone]. If 7PM-7AM, please contact night-coverage at [email or web address], password [facility] 03/26/2013, 6:59 AM  LOS: 2 days

## 2013-03-27 DIAGNOSIS — J111 Influenza due to unidentified influenza virus with other respiratory manifestations: Secondary | ICD-10-CM

## 2013-03-27 LAB — CBC WITH DIFFERENTIAL/PLATELET
Basophils Relative: 0 % (ref 0–1)
Eosinophils Absolute: 0 10*3/uL (ref 0.0–0.7)
Eosinophils Relative: 0 % (ref 0–5)
HCT: 24.9 % — ABNORMAL LOW (ref 36.0–46.0)
Hemoglobin: 8.6 g/dL — ABNORMAL LOW (ref 12.0–15.0)
Lymphocytes Relative: 48 % — ABNORMAL HIGH (ref 12–46)
MCHC: 34.5 g/dL (ref 30.0–36.0)
Monocytes Absolute: 0.8 10*3/uL (ref 0.1–1.0)
Monocytes Relative: 27 % — ABNORMAL HIGH (ref 3–12)
Neutro Abs: 0.8 10*3/uL — ABNORMAL LOW (ref 1.7–7.7)
Platelets: 163 10*3/uL (ref 150–400)
RBC: 2.71 MIL/uL — ABNORMAL LOW (ref 3.87–5.11)

## 2013-03-27 LAB — COMPREHENSIVE METABOLIC PANEL WITH GFR
ALT: 18 U/L (ref 0–35)
AST: 18 U/L (ref 0–37)
Albumin: 2.5 g/dL — ABNORMAL LOW (ref 3.5–5.2)
Alkaline Phosphatase: 779 U/L — ABNORMAL HIGH (ref 39–117)
BUN: 26 mg/dL — ABNORMAL HIGH (ref 6–23)
CO2: 19 meq/L (ref 19–32)
Calcium: 8.6 mg/dL (ref 8.4–10.5)
Chloride: 97 meq/L (ref 96–112)
Creatinine, Ser: 1.06 mg/dL (ref 0.50–1.10)
GFR calc Af Amer: 63 mL/min — ABNORMAL LOW
GFR calc non Af Amer: 55 mL/min — ABNORMAL LOW
Glucose, Bld: 82 mg/dL (ref 70–99)
Potassium: 3.7 meq/L (ref 3.5–5.1)
Sodium: 127 meq/L — ABNORMAL LOW (ref 135–145)
Total Bilirubin: 0.2 mg/dL — ABNORMAL LOW (ref 0.3–1.2)
Total Protein: 6.7 g/dL (ref 6.0–8.3)

## 2013-03-27 LAB — MAGNESIUM: Magnesium: 1.8 mg/dL (ref 1.5–2.5)

## 2013-03-27 MED ORDER — ZINC SULFATE 220 (50 ZN) MG PO CAPS: 220.0000 mg | ORAL_CAPSULE | Freq: Two times a day (BID) | ORAL | Status: DC

## 2013-03-27 MED ORDER — DM-GUAIFENESIN ER 30-600 MG PO TB12: 1.0000 | ORAL_TABLET | Freq: Two times a day (BID) | ORAL | Status: DC

## 2013-03-27 MED ORDER — OSELTAMIVIR PHOSPHATE 30 MG PO CAPS: 30.0000 mg | ORAL_CAPSULE | Freq: Two times a day (BID) | ORAL | Status: AC

## 2013-03-27 MED ORDER — OSELTAMIVIR PHOSPHATE 30 MG PO CAPS: 30.0000 mg | ORAL_CAPSULE | Freq: Two times a day (BID) | ORAL | Status: DC

## 2013-03-27 MED ORDER — CYANOCOBALAMIN 2000 MCG PO TABS: 2000.0000 ug | ORAL_TABLET | Freq: Every day | ORAL | Status: DC

## 2013-03-27 MED ORDER — PREDNISONE 20 MG PO TABS: 40.0000 mg | ORAL_TABLET | Freq: Every day | ORAL | Status: DC

## 2013-03-27 MED ORDER — BENZONATATE 200 MG PO CAPS: 200.0000 mg | ORAL_CAPSULE | Freq: Three times a day (TID) | ORAL | Status: DC | PRN

## 2013-03-27 NOTE — Discharge Summary (Signed)
Physician Discharge Summary  [name] WUJ:[id number] DOB: July 26, 1950 DOA: 03/24/2013  PCP: Cassell Clement, MD  Admit date: 03/24/2013 Discharge date: 03/27/2013  Time spent: >30 minutes  Recommendations for Outpatient Follow-up:  Follow-up Information   Follow up with Cassell Clement, MD. (call for appt upon discharge)    Specialty:  Cardiology   Contact information:   [address] [place] [address] [phone]       Follow up with [facility], NI, MD. (call for appt upon discharge)    Specialty:  Hematology and Oncology   Contact information:   [address] [place] [address] [phone]        Discharge Diagnoses:  Principal Problem:   Influenza with pneumonia Active Problems:   Anemia   Chronic back pain   Atrial fibrillation   Chronic ischemic colitis   Osteoarthritis of hip   Chronic hyponatremia   CMML (chronic myelomonocytic leukemia)   Ischemic colitis   Open abdominal wall wound   Anemia of other chronic disease   Febrile illness   SIADH (syndrome of inappropriate ADH production)   Discharge Condition: improved/stable  Diet recommendation: low sodium, cardiac  Filed Weights   03/24/13 1749 03/26/13 0400  Weight: 63.8 kg (140 lb 10.5 oz) 67.4 kg (148 lb 9.4 oz)    History of present illness:  [name] is a 63 y.o. WF PMHx ischemic colitis, which necessitated multiple abdominal surgeries (see surgical history), currently has a ileostomy bag, and partially healed surgical site mid abdomen from exploratory laparotomy performed by Dr. Avel Peace on 05/22/2012, per Dr.Gorsuch, Ni (oncology) possible.CMML, B12 deficiency. Patient was admitted on 9/4 2014 and 9/19 2014 to hospital for bronchitis and atrial fibrillation with RVR respectively.  Pt from home reports that she started to have congestion, productive cough (yellow sputum), increased SOB x6 days, nausea x6 days, and fever yesterday. Pt has taken OTC meds with no  relief, progressively getting worse. Pt reports some SOB, but denies CP.     HPI: [name] is a 62 y.o. WF PMHx ischemic colitis, which necessitated multiple abdominal surgeries (see surgical history), currently has a ileostomy bag, and partially healed surgical site mid abdomen from exploratory laparotomy performed by Dr. Avel Peace on 05/22/2012, per Dr.Gorsuch, Ni (oncology) possible.CMML, B12 deficiency. Patient was admitted on 9/4 2014 and 9/19 2014 to hospital for bronchitis and atrial fibrillation with RVR respectively.  Pt from home reports that she started to have congestion, productive cough (yellow sputum), increased SOB x6 days, nausea x6 days, and fever yesterday. Pt has taken OTC meds with no relief, progressively getting worse. Pt reports some SOB, but denies CP. She was admitted for further eval and management.She was admitted for further eval and management   Hospital Course:  Febrile illness/ high fever, due to influenza  Upon admission pt was placed on empiric abx with Aztreonam + Linezolid  -repeat CXR of 12/8 and the cultures obtained on admission all came back negative and the abx were dc'ed  - Strep pneumo, legionella urinary antigens all came back neg. -the influenza panel came back + and the impression was that her febrile illness was d/t influneza A and that pt did not have HCAP. - She rapidly improved, defervecsed and remained afebrile on Tamiflu and was to continue the treatment course on d/c Fevers up to 104.61F, likely secondary to influenza. UA neg and abdomen stable  - As discussed above Acute COPD/asthma exac  - she was placed prednisone taper and  brochodilators - O2 via nasal cannula to maintain SpO2> 92%  -she is to follow up outpt upon discharge. Hyponatremia due to SIADH, currently at baseline 125- 133  Dehydration resolved. Encouraged good oral intake today  - D/c IVF  + gap metabolic acidosis which is chronic and due to ileostomy, at baseline.   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, worsened today likely due to acute illness  -transfused 1 unit PRBC on 12/8, her hgb stable at 8.6 on d/c Atrial fibrillation with RVR, d/t acute infection and fever- Treat infection and fever  - acutely her bisoprolol Increased to 10mg ,infection was treated as above -Her tachycardia resolved with treatment as above and she was to continue her preadmission meds and follow up with her PCP outpt.  Diastolic heart failure, chronic, but at risk for decompensation given IVF. Last ECHO a few months ago with preserved EF and grade 1 DD  -proBNP mildly elevated but decreasing. Fluid status was monitored in the hospital and she remained compensated and was to follow up with her PCPcardiologist on d/c Chronic ischemic colitis Patient's abdominal exam is not consistent with colitis. stable  Elevated alkaline phosphatase chronically elevated and likely higher because she was dehydrated, febrile and sedentary. Previous fractionation was primarily from bone  Open abdominal wall wound/Poor wound healing, gradually improving, no recent changes  -wound care Consulted and saw pt and she rceived local wound care in the hospital - she was maintained on  zinc  CMML, chronic stable. Per oncology.  Vitamin B12 deficiency, last checked a few months ago and still borderline low  - Restarted Vit B12 oral supplementation  Hypomagnesemia, resolved with IV supplementation  Leukopenia, likely reactive from viral infection  Thrombocytopenia, likely to marrow suppression from CMML and flu, stable.  -follow up with heme outpt   Procedures:  none   none  Discharge Exam: Filed Vitals:   03/27/13 0536  BP: 160/86  Pulse: 75  Temp: 97.7 F (36.5 C)  Resp: 20   Exam:  General: CF, No acute distress  HEENT: NCAT, MMM  Cardiovascular: IRRR rate in 130s, nl S1, S2 no obvious mrg, 2+ pulses, warm extremities  Respiratory: Full exp wheeze with occasional rhonchi, some rales heard over right  chest, diminished throughout, no increased WOB  Abdomen: NABS, soft, NT/ND, ileostomy with green liquid. Abdominal wound with health appearing granulation tissue, no discharge or odor or surrounding erythema.  MSK: Normal tone and bulk, no LEE  Neuro: Grossly intact    Discharge Instructions  Discharge Orders   Future Appointments Provider Department Dept Phone   04/02/2013 11:40 AM Adolph Pollack, MD [facility], [place] [phone]   05/22/2013 1:45 PM Cassell Clement, MD [facility] [place] Office [phone]   08/28/2013 1:30 PM [facility] [phone]   08/28/2013 2:00 PM Artis Delay, MD [facility] [phone]   Future Orders Complete By Expires   Diet - low sodium heart healthy  As directed    Increase activity slowly  As directed        Medication List    STOP taking these medications       ondansetron 4 MG tablet  Commonly known as:  ZOFRAN      TAKE these medications       albuterol 108 (90 BASE) MCG/ACT inhaler  Commonly known as:  PROVENTIL HFA;VENTOLIN HFA  Inhale 1-2 puffs into the lungs every 6 (six) hours as needed for wheezing.  aspirin 325 MG EC tablet  Take 325 mg by mouth daily.     benzonatate 200 MG capsule  Commonly known as:  TESSALON  Take 1 capsule (200 mg total) by mouth 3 (three) times daily as needed for cough.     bisoprolol 5 MG tablet  Commonly known as:  ZEBETA  Take 1 tablet (5 mg total) by mouth daily before breakfast.     buPROPion 300 MG 24 hr tablet  Commonly known as:  WELLBUTRIN XL  Take 300 mg by mouth daily before breakfast.     celecoxib 200 MG capsule  Commonly known as:  CELEBREX  Take 200 mg by mouth daily.     cyanocobalamin 2000 MCG tablet  Take 1 tablet (2,000 mcg total) by mouth daily.     dextromethorphan-guaiFENesin 30-600 MG per 12 hr tablet  Commonly known as:  MUCINEX DM  Take 1 tablet by mouth 2 (two)  times daily.     diphenoxylate-atropine 2.5-0.025 MG per tablet  Commonly known as:  LOMOTIL  Take 1 tablet by mouth 4 (four) times daily as needed for diarrhea or loose stools.     esomeprazole 40 MG capsule  Commonly known as:  NEXIUM  Take 40 mg by mouth every morning.     estradiol 2 MG tablet  Commonly known as:  ESTRACE  Take 2 mg by mouth daily before breakfast.     furosemide 40 MG tablet  Commonly known as:  LASIX  Take 40 mg by mouth as needed for fluid or edema.     loperamide 2 MG capsule  Commonly known as:  IMODIUM  Take 2 mg by mouth 4 (four) times daily as needed for diarrhea or loose stools.     loratadine-pseudoephedrine 5-120 MG per tablet  Commonly known as:  CLARITIN-D 12-hour  Take 1 tablet by mouth daily as needed for allergies.     oseltamivir 30 MG capsule  Commonly known as:  TAMIFLU  Take 1 capsule (30 mg total) by mouth 2 (two) times daily.  Start taking on:  03/28/2013     predniSONE 20 MG tablet  Commonly known as:  DELTASONE  Take 2 tablets (40 mg total) by mouth daily with breakfast.     silver nitrate applicators 75-25 % applicator  Apply 1 Stick topically every 5 (five) minutes x 3 doses as needed (Please send 4 doses now/Apply to bleeders in abd incision stat).     tiotropium 18 MCG inhalation capsule  Commonly known as:  SPIRIVA  Place 1 capsule (18 mcg total) into inhaler and inhale daily.     zinc sulfate 220 MG capsule  Take 1 capsule (220 mg total) by mouth 2 (two) times daily.       Allergies  Allergen Reactions  . Vancomycin Hives and Rash    ? wheezing  . Ativan [Lorazepam]     confusion  . Codeine Nausea And Vomiting  . Tetanus Toxoids Other (See Comments)    serum  . Penicillins Hives and Rash  . Xarelto [Rivaroxaban] Hives and Rash    ?      The results of significant diagnostics from this hospitalization (including imaging, microbiology, ancillary and laboratory) are listed below for reference.     Significant Diagnostic Studies: Dg Chest Port 1 View  03/25/2013   CLINICAL DATA:  Cough.  EXAM: PORTABLE CHEST - 1 VIEW  COMPARISON:  03/24/2013  FINDINGS: The heart size is within normal limits. There is stable tortuosity of  the thoracic aorta. Mild bibasilar atelectasis present. Lungs are otherwise clear. The visualized skeletal structures are unremarkable.  IMPRESSION: Mild bibasilar atelectasis.  No focal infiltrate identified.   Electronically Signed   By: Irish Lack M.D.   On: 03/25/2013 08:42   Dg Abd Acute W/chest  03/24/2013   CLINICAL DATA:  Shortness of breath, abdominal pain, nausea  EXAM: ACUTE ABDOMEN SERIES (ABDOMEN 2 VIEW & CHEST 1 VIEW)  COMPARISON:  Chest radiographs dated 01/17/2013. CT abdomen pelvis dated 05/22/2012.  FINDINGS: Chronic interstitial markings/ emphysematous. Mild right basilar opacities, likely atelectasis. Left lower lobe opacity has resolved. No pleural effusion or pneumothorax.  Nonspecific bowel gas pattern without disproportionate small bowel dilatation to suggest small bowel obstruction. Surgical sutures in the right mid abdomen.  No evidence of free air under the diaphragm on the upright view.  Degenerative changes of the lumbar spine with levoscoliosis.  Left hip arthroplasty.  IMPRESSION: Mild right basilar opacity, likely atelectasis. Prior left lower lobe opacity has resolved.  No findings to suggest small bowel obstruction or free air.   Electronically Signed   By: Charline Bills M.D.   On: 03/24/2013 14:07    Microbiology: Recent Results (from the past 240 hour(s))  CULTURE, BLOOD (ROUTINE X 2)     Status: None   Collection Time    03/24/13  4:45 PM      Result Value Range Status   Specimen Description BLOOD LEFT HAND   Final   Special Requests BOTTLES DRAWN AEROBIC ONLY 2CC   Final   Culture  Setup Time     Final   Value: 03/24/2013 20:03     Performed at [facility]   Culture     Final   Value:        BLOOD CULTURE RECEIVED  NO GROWTH TO DATE CULTURE WILL BE HELD FOR 5 DAYS BEFORE ISSUING A FINAL NEGATIVE REPORT     Performed at [facility]   Report Status PENDING   Incomplete  CULTURE, BLOOD (ROUTINE X 2)     Status: None   Collection Time    03/24/13  5:01 PM      Result Value Range Status   Specimen Description BLOOD LEFT HAND   Final   Special Requests BOTTLES DRAWN AEROBIC ONLY 1CC   Final   Culture  Setup Time     Final   Value: 03/24/2013 20:03     Performed at [facility]   Culture     Final   Value:        BLOOD CULTURE RECEIVED NO GROWTH TO DATE CULTURE WILL BE HELD FOR 5 DAYS BEFORE ISSUING A FINAL NEGATIVE REPORT     Performed at [facility]   Report Status PENDING   Incomplete  MRSA PCR SCREENING     Status: Abnormal   Collection Time    03/24/13  8:05 PM      Result Value Range Status   MRSA by PCR POSITIVE (*) NEGATIVE Final   Comment:            The GeneXpert MRSA Assay (FDA     approved for NASAL specimens     only), is one component of a     comprehensive MRSA colonization     surveillance program. It is not     intended to diagnose MRSA     infection nor to guide or     monitor treatment for     MRSA  infections.     CRITICAL RESULT CALLED TO, READ BACK BY AND VERIFIED WITH:     SPOKE WITH STERN,R RN [phone] HAMER,N   RESPIRATORY VIRUS PANEL     Status: Abnormal   Collection Time    03/24/13 11:15 PM      Result Value Range Status   Source - RVPAN NOSE   Final   Respiratory Syncytial Virus A NOT DETECTED   Final   Respiratory Syncytial Virus B NOT DETECTED   Final   Influenza A DETECTED (*)  Final   Influenza B NOT DETECTED   Final   Parainfluenza 1 NOT DETECTED   Final   Parainfluenza 2 NOT DETECTED   Final   Parainfluenza 3 NOT DETECTED   Final   Metapneumovirus NOT DETECTED   Final   Rhinovirus NOT DETECTED   Final   Adenovirus NOT DETECTED   Final   Influenza A H1 NOT DETECTED   Final   Influenza A H3 NOT DETECTED   Final   Comment:  (NOTE)           Normal Reference Range for each Analyte: NOT DETECTED     Testing performed using the Luminex xTAG Respiratory Viral Panel test     kit.     This test was developed and its performance characteristics determined     by [facility]. It has not been cleared or approved by the [place]     Food and Drug Administration. This test is used for clinical purposes.     It should not be regarded as investigational or for research. This     laboratory is certified under the Clinical Laboratory Improvement     Amendments of 1988 (CLIA) as qualified to perform high complexity     clinical laboratory testing.     Performed at [facility]: Basic Metabolic Panel:  Recent Labs Lab 03/24/13 1400 03/25/13 0334 03/25/13 1517 03/26/13 0414 03/27/13 0759  NA 122* 127* 123* 126* 127*  K 4.3 3.8 4.7 3.8 3.7  CL 90* 96 93* 97 97  CO2 17* 16* 15* 15* 19  GLUCOSE 102* 120* 135* 137* 82  BUN 14 17 26* 28* 26*  CREATININE 0.84 0.90 1.06 1.05 1.06  CALCIUM 7.9* 7.3* 7.3* 7.7* 8.6  MG  --  0.9* 3.3* 2.5 1.8  PHOS  --   --  4.5 3.7  --    Liver Function Tests:  Recent Labs Lab 03/24/13 1400 03/25/13 0334 03/26/13 0414 03/27/13 0759  AST 27 19 15 18   ALT 31 21 18 18   ALKPHOS 1150* 822* 778* 779*  BILITOT 0.5 0.4 0.3 0.2*  PROT 7.4 6.3 6.6 6.7  ALBUMIN 2.9* 2.4* 2.3* 2.5*    Recent Labs Lab 03/24/13 1400  LIPASE 13   No results found for this basename: AMMONIA,  in the last 168 hours CBC:  Recent Labs Lab 03/24/13 1400 03/25/13 0334 03/26/13 0414 03/27/13 0759  WBC 4.5 4.6 2.8* 3.0*  NEUTROABS 0.8* 1.3* 1.5* 0.8*  HGB 8.2* 7.0* 7.8* 8.6*  HCT 24.4* 20.5* 23.2* 24.9*  MCV 93.5 94.0 93.9 91.9  PLT 122* 93* 110* 163   Cardiac Enzymes: No results found for this basename: CKTOTAL, CKMB, CKMBINDEX, TROPONINI,  in the last 168 hours BNP: BNP (last 3 results)  Recent Labs  03/24/13 2031 03/25/13 0334 03/26/13 0414  PROBNP 9254.0* 9403.0*  4535.0*   CBG: No results found for this basename: GLUCAP,  in the last 168 hours     Signed:  Kela Millin  [facility] 03/27/2013, 2:59 PM

## 2013-03-29 LAB — TYPE AND SCREEN
ABO/RH(D): A POS
DAT, IgG: POSITIVE
Unit division: 0
Unit division: 0

## 2013-03-30 LAB — CULTURE, BLOOD (ROUTINE X 2): Culture: NO GROWTH

## 2013-04-02 ENCOUNTER — Encounter (INDEPENDENT_AMBULATORY_CARE_PROVIDER_SITE_OTHER): Payer: BC Managed Care – PPO | Admitting: General Surgery

## 2013-05-06 ENCOUNTER — Encounter (INDEPENDENT_AMBULATORY_CARE_PROVIDER_SITE_OTHER): Payer: Self-pay | Admitting: General Surgery

## 2013-05-06 ENCOUNTER — Ambulatory Visit (INDEPENDENT_AMBULATORY_CARE_PROVIDER_SITE_OTHER): Payer: BC Managed Care – PPO | Admitting: General Surgery

## 2013-05-06 VITALS — BP 116/74 | HR 72 | Temp 97.6°F | Resp 16 | Ht 65.0 in | Wt 134.8 lb

## 2013-05-06 DIAGNOSIS — S31109A Unspecified open wound of abdominal wall, unspecified quadrant without penetration into peritoneal cavity, initial encounter: Secondary | ICD-10-CM

## 2013-05-06 NOTE — Progress Notes (Signed)
Subjective:     Patient ID: [name], female   DOB: 1950-11-17, 63 y.o.   MRN: [id number]  HPI  She is here for followup of her chronic open abdominal wound. Since I saw her last, she was hospitalized for influenza and pneumonia. She is doing much better from that standpoint. No bleeding from the wound.   Review of Systems     Objective:   Physical Exam Gen.-she is thin and is in no acute distress.  Abdomen-loop ileostomy in the right abdomen is present. The wound is smaller. There is some floating mesh which I debrided. No bleeding from the wound. I redressed the wound.    Assessment:     Chronic open abdominal wound that is slowly closing. Some of the floating mesh was debrided.     Plan:     Continue current wound care. Return visit 4 weeks.

## 2013-05-06 NOTE — Patient Instructions (Signed)
Continue current wound care

## 2013-05-22 ENCOUNTER — Ambulatory Visit (INDEPENDENT_AMBULATORY_CARE_PROVIDER_SITE_OTHER): Payer: BC Managed Care – PPO | Admitting: Cardiology

## 2013-05-22 ENCOUNTER — Encounter: Payer: Self-pay | Admitting: Cardiology

## 2013-05-22 VITALS — BP 128/69 | HR 93 | Ht 65.0 in | Wt 139.0 lb

## 2013-05-22 DIAGNOSIS — I4891 Unspecified atrial fibrillation: Secondary | ICD-10-CM

## 2013-05-22 DIAGNOSIS — Z9049 Acquired absence of other specified parts of digestive tract: Secondary | ICD-10-CM

## 2013-05-22 DIAGNOSIS — D638 Anemia in other chronic diseases classified elsewhere: Secondary | ICD-10-CM

## 2013-05-22 DIAGNOSIS — K551 Chronic vascular disorders of intestine: Secondary | ICD-10-CM

## 2013-05-22 DIAGNOSIS — I480 Paroxysmal atrial fibrillation: Secondary | ICD-10-CM

## 2013-05-22 DIAGNOSIS — Z9889 Other specified postprocedural states: Secondary | ICD-10-CM

## 2013-05-22 DIAGNOSIS — I119 Hypertensive heart disease without heart failure: Secondary | ICD-10-CM

## 2013-05-22 NOTE — Patient Instructions (Signed)
Your physician recommends that you continue on your current medications as directed. Please refer to the Current Medication list given to you today.  Your physician wants you to follow-up in: 4 month ov/ekg  You will receive a reminder letter in the mail two months in advance. If you don't receive a letter, please call our office to schedule the follow-up appointment.  

## 2013-05-22 NOTE — Progress Notes (Signed)
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 In early 2013 she was hospitalized on the medical service after presenting with sudden onset of severe diarrhea associated with syncope and hypotension. During the course of that hospitalization she underwent colonoscopy which showed ischemic colitis. Also during that hospital stay she also had episodes of paroxysmal atrial fibrillation which responded to beta blocker. During that hospital stay she had an echocardiogram showing normal LV systolic function and normal left atrial size. Because of her past history of anemia and multiple comorbidities she was not placed on warfarin. She has a past history of small bowel obstruction. Her surgeon is Dr. Abbey Chatters.  She has multiple medical issues which are as outlined below. These include PAF, ischemic colitis, depression, HTN, and tobacco abuse. She has a normal EF per echo from earlier this year.  In September 2013 she was admitted at [facility] with another attack of ischemic colitis. She ended up having colon surgery with a colostomy placed. She had PAF while hospitalized. Also with MRSA from a PICC line.  She subsequently had exploratory abdominal surgery here in [place] and  had a prolonged post hospital course.  Dr. Abbey Chatters is her surgeon  During her hospitalization the patient had paroxysmal atrial fibrillation with rapid ventricular response.  She has had several recent admissions to [facility].  She was hospitalized a month ago with severe anemia and a hemoglobin of 7 and required transfusion.  Apparently it was felt that the anemia was secondary to chronic blood loss around her stoma.  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 Most recently she was admitted 03/24/13  with pneumonia following influenza A. and influenza H1N1.  She responded to antibiotics and was discharged after 3 days.  While in the hospital she had paroxysmal atrial fibrillation which resolved as her medical illness improved.  Since then she has had no further atrial fibrillation.  Current Outpatient Prescriptions  Medication Sig Dispense Refill  . albuterol (PROVENTIL HFA;VENTOLIN HFA) 108 (90 BASE) MCG/ACT inhaler Inhale 1-2 puffs into the lungs every 6 (six) hours as needed for wheezing.      Marland Kitchen aspirin 325 MG EC tablet Take 325 mg by mouth daily.      . benzonatate (TESSALON) 200 MG capsule Take 1 capsule (200 mg total) by mouth 3 (three) times daily as needed for cough.  30 capsule  0  . bisoprolol (ZEBETA) 5 MG tablet Take 1 tablet (5 mg total) by mouth daily before breakfast.  90 tablet  3  . buPROPion (WELLBUTRIN XL) 300 MG 24 hr tablet Take 300 mg by mouth daily before breakfast.       . celecoxib (CELEBREX) 200 MG capsule Take 200 mg by mouth daily.      Marland Kitchen dextromethorphan-guaiFENesin (MUCINEX DM) 30-600 MG per 12 hr tablet Take 1 tablet by mouth 2 (two) times daily.  30 tablet  0  . diphenoxylate-atropine (LOMOTIL) 2.5-0.025 MG per tablet Take 1 tablet by mouth 4 (four) times daily as needed for diarrhea or loose stools.       Marland Kitchen esomeprazole (NEXIUM) 40 MG capsule Take 40 mg by mouth every morning.       Marland Kitchen estradiol (ESTRACE) 2 MG tablet Take 2 mg  by mouth daily before breakfast.       . furosemide (LASIX) 40 MG tablet Take 40 mg by mouth as needed for fluid or edema.       Marland Kitchen loperamide (IMODIUM) 2 MG capsule Take 2 mg by mouth 4 (four) times daily as needed for diarrhea or loose stools.      Marland Kitchen loratadine-pseudoephedrine (CLARITIN-D 12-HOUR) 5-120 MG per tablet Take 1 tablet by mouth daily as needed for allergies.       Marland Kitchen ondansetron (ZOFRAN) 4 MG tablet       . silver nitrate applicators 40-97 % applicator Apply 1 Stick topically every 5 (five) minutes x 3 doses as needed (Please  send 4 doses now/Apply to bleeders in abd incision stat).  100 each  0  . tiotropium (SPIRIVA) 18 MCG inhalation capsule Place 1 capsule (18 mcg total) into inhaler and inhale daily.  30 capsule  0  . vitamin B-12 2000 MCG tablet Take 1 tablet (2,000 mcg total) by mouth daily.  30 tablet  0  . zinc sulfate 220 MG capsule Take 1 capsule (220 mg total) by mouth 2 (two) times daily.  30 capsule  0  . predniSONE (DELTASONE) 20 MG tablet Take 2 tablets (40 mg total) by mouth daily with breakfast.  10 tablet  0   No current facility-administered medications for this visit.    Allergies  Allergen Reactions  . Vancomycin Hives and Rash    ? wheezing  . Ativan [Lorazepam]     confusion  . Codeine Nausea And Vomiting  . Tetanus Toxoids Other (See Comments)    serum  . Penicillins Hives and Rash  . Xarelto [Rivaroxaban] Hives and Rash    ?    Patient Active Problem List   Diagnosis Date Noted  . 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 Febrile illness 03/24/2013  . B12 deficiency 02/27/2013  . Anemia of other chronic disease 01/28/2013  . Influenza with pneumonia 01/11/2013  . Obstructive chronic bronchitis with exacerbation 01/06/2013  . Severe sepsis 01/04/2013  . Acute respiratory failure 12/20/2012  . Cough 11/19/2012  . Hypocalcemia 07/17/2012  . Hypomagnesemia 06/27/2012  . Open abdominal wall wound 06/20/2012  . Ileostomy status 06/20/2012  . Elevated liver function tests-chronic 05/17/2012  . Ischemic colitis 01/31/2012  . 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 Osteoarthritis of hip 10/27/2011  . Postoperative anemia due to acute blood loss 10/27/2011  . Chronic hyponatremia 10/27/2011  . WBC disease 10/27/2011  . Ileus 10/27/2011  . Partial small bowel obstruction 07/05/2011  . Atrial fibrillation 07/05/2011  . Chronic ischemic colitis 07/05/2011  . Hypotension 07/05/2011    Class: Acute  . Atrial fibrillation with rapid  ventricular response 05/09/2011  . Chronic back pain 05/08/2011  . Diarrhea 05/07/2011  . Dehydration 05/07/2011  . Benign hypertensive heart disease without heart failure 12/15/2010  . Pure hypercholesterolemia 12/15/2010  . Anemia 12/15/2010    History  Smoking status  . Current Some Day Smoker -- 0.25 packs/day for 20 years  . Types: Cigarettes  . Last Attempt to Quit: 03/21/2013  Smokeless tobacco  . Never Used    History  Alcohol Use No    Comment: 1 - 2 glasses of wine per week    Family History  Problem Relation Age of Onset  . Stroke Father   . Atrial fibrillation Father   . Hypertension Mother     Review of Systems: Constitutional: no fever chills diaphoresis or fatigue  or change in weight.  Head and neck: no hearing loss, no epistaxis, no photophobia or visual disturbance. Respiratory: No cough, shortness of breath or wheezing. Cardiovascular: No chest pain peripheral edema, palpitations. Gastrointestinal: No abdominal distention, no abdominal pain, no change in bowel habits hematochezia or melena. Genitourinary: No dysuria, no frequency, no urgency, no nocturia. Musculoskeletal:No arthralgias, no back pain, no gait disturbance or myalgias. Neurological: No dizziness, no headaches, no numbness, no seizures, no syncope, no weakness, no tremors. Hematologic: No lymphadenopathy, no easy bruising. Psychiatric: No confusion, no hallucinations, no sleep disturbance.    Physical Exam: Filed Vitals:   05/22/13 1354  BP: 128/69  Pulse: 93   the general appearance reveals a chronically ill middle-age woman in no distress.The head and neck exam reveals pupils equal and reactive.  Extraocular movements are full.  There is no scleral icterus.  The mouth reveals changes are candidiasis.  The neck is supple.  The carotids reveal no bruits.  The jugular venous pressure is normal.  The  thyroid is not enlarged.  There is no lymphadenopathy.  The chest  reveals scattered  rhonchi bilaterally but worse on the right base.  There is no dullness to percussion.  Expansion of the chest is symmetrical.  The precordium is quiet.  The first heart sound is normal.  The second heart sound is physiologically split.  There is no murmur gallop rub or click.  There is no abnormal lift or heave.  The abdomen is soft and nontender.  There is an ileostomy in the right lower abdomen.  There is a fistula in the midline of the abdomen which has a dressing which was not examined.  The bowel sounds are normal.  The liver and spleen are not enlarged.  There are no abdominal masses.  There are no abdominal bruits.  There is an ileostomy present.  Extremities reveal weak pedal pulses.  There is no phlebitis or edema.  There is acrocyanosis and coolness of her feet bilaterally. Strength is normal and symmetrical in all extremities.  There is no lateralizing weakness.  There are no sensory deficits.  The skin is warm and dry.  There is no rash.  EKG today confirms normal sinus rhythm.  Since 03/25/13, atrial fibrillation has resolved.   Assessment / Plan: Continue on same medication.  Recheck in 4 months for office visit and EKG

## 2013-05-22 NOTE — Assessment & Plan Note (Signed)
The patient is remaining in normal sinus rhythm.  She is not on Coumadin or other oral anticoagulation because of her severe comorbidities and prior history of severe anemia and GI bleed.  In December her hemoglobin was 7.  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

## 2013-05-22 NOTE — Assessment & Plan Note (Signed)
Blood pressure has been remaining stable on current medication.  She has not been checking her blood pressure at home however.  She has not been experiencing any dizziness or syncope.  She has not had any symptoms of CHF and her chest x-ray on 03/25/13 showed normal heart size

## 2013-05-22 NOTE — Assessment & Plan Note (Signed)
Patient has chronic ischemic colitis with multiple previous abdominal surgical procedures.  She has a slowly healing abdominal wound.  She has a functioning ileostomy.

## 2013-05-29 ENCOUNTER — Telehealth (INDEPENDENT_AMBULATORY_CARE_PROVIDER_SITE_OTHER): Payer: Self-pay | Admitting: *Deleted

## 2013-05-29 ENCOUNTER — Other Ambulatory Visit: Payer: Self-pay | Admitting: Hematology and Oncology

## 2013-05-29 ENCOUNTER — Telehealth: Payer: Self-pay | Admitting: *Deleted

## 2013-05-29 ENCOUNTER — Other Ambulatory Visit (INDEPENDENT_AMBULATORY_CARE_PROVIDER_SITE_OTHER): Payer: Self-pay | Admitting: *Deleted

## 2013-05-29 DIAGNOSIS — C9310 Chronic myelomonocytic leukemia not having achieved remission: Secondary | ICD-10-CM

## 2013-05-29 MED ORDER — DIPHENOXYLATE-ATROPINE 2.5-0.025 MG PO TABS: 1.0000 | ORAL_TABLET | Freq: Four times a day (QID) | ORAL | Status: DC | PRN

## 2013-05-29 NOTE — Telephone Encounter (Signed)
Patient called to request a refill of her Lomotil.  Explained that I would send a message to Dr. Zella Richer to ask him about this then we will let her know.  Patient states understanding and agreeable at this time.

## 2013-05-29 NOTE — Telephone Encounter (Signed)
Called prescription into [facility] [address] at this time.

## 2013-05-29 NOTE — Telephone Encounter (Signed)
May refill Lomotil X3.

## 2013-05-29 NOTE — Telephone Encounter (Signed)
Does she wants to just get labs checked and not see me? My next available 30 minutes is Monday If she wants labs only, we can get her a lab appointment but if she wants labs and see me, then we can move her May appt to next Monday

## 2013-05-29 NOTE — Telephone Encounter (Signed)
Pt states feeling more fatigued and some sob w/ activity.  Asks if she can come in to have her Hgb checked?

## 2013-05-29 NOTE — Telephone Encounter (Signed)
Instructed pt on lab appt tomorrow morning at 9:30 am.  Instructed her to wait for the results in case she needs transfusion or any new instructions from Dr. Alvy Bimler.  Instructed her to alert lab tech she is waiting so they will tell the nurse.  She verbalized understanding.

## 2013-05-30 ENCOUNTER — Other Ambulatory Visit: Payer: Self-pay | Admitting: *Deleted

## 2013-05-30 ENCOUNTER — Other Ambulatory Visit: Payer: BC Managed Care – PPO

## 2013-05-30 ENCOUNTER — Telehealth: Payer: Self-pay | Admitting: Hematology and Oncology

## 2013-05-30 NOTE — Telephone Encounter (Signed)
, °

## 2013-06-03 ENCOUNTER — Other Ambulatory Visit: Payer: Self-pay | Admitting: Hematology and Oncology

## 2013-06-03 ENCOUNTER — Other Ambulatory Visit (HOSPITAL_BASED_OUTPATIENT_CLINIC_OR_DEPARTMENT_OTHER): Payer: BC Managed Care – PPO

## 2013-06-03 DIAGNOSIS — D649 Anemia, unspecified: Secondary | ICD-10-CM

## 2013-06-03 DIAGNOSIS — E538 Deficiency of other specified B group vitamins: Secondary | ICD-10-CM

## 2013-06-03 DIAGNOSIS — D638 Anemia in other chronic diseases classified elsewhere: Secondary | ICD-10-CM

## 2013-06-03 DIAGNOSIS — C9310 Chronic myelomonocytic leukemia not having achieved remission: Secondary | ICD-10-CM

## 2013-06-03 LAB — CBC & DIFF AND RETIC
BASO%: 0.4 % (ref 0.0–2.0)
Basophils Absolute: 0 10*3/uL (ref 0.0–0.1)
EOS%: 1.8 % (ref 0.0–7.0)
Eosinophils Absolute: 0.1 10*3/uL (ref 0.0–0.5)
HEMATOCRIT: 26.6 % — AB (ref 34.8–46.6)
HGB: 8.5 g/dL — ABNORMAL LOW (ref 11.6–15.9)
IMMATURE RETIC FRACT: 15.3 % — AB (ref 1.60–10.00)
LYMPH%: 22.8 % (ref 14.0–49.7)
MCH: 31.1 pg (ref 25.1–34.0)
MCHC: 32 g/dL (ref 31.5–36.0)
MCV: 97.4 fL (ref 79.5–101.0)
MONO#: 1.6 10*3/uL — ABNORMAL HIGH (ref 0.1–0.9)
MONO%: 27.5 % — AB (ref 0.0–14.0)
NEUT#: 2.7 10*3/uL (ref 1.5–6.5)
NEUT%: 47.5 % (ref 38.4–76.8)
Platelets: 268 10*3/uL (ref 145–400)
RBC: 2.73 10*6/uL — AB (ref 3.70–5.45)
RDW: 17.9 % — ABNORMAL HIGH (ref 11.2–14.5)
Retic %: 3.44 % — ABNORMAL HIGH (ref 0.70–2.10)
Retic Ct Abs: 93.91 10*3/uL — ABNORMAL HIGH (ref 33.70–90.70)
WBC: 5.7 10*3/uL (ref 3.9–10.3)
lymph#: 1.3 10*3/uL (ref 0.9–3.3)
nRBC: 2 % — ABNORMAL HIGH (ref 0–0)

## 2013-06-03 LAB — HOLD TUBE, BLOOD BANK

## 2013-06-03 LAB — FERRITIN CHCC: Ferritin: 199 ng/ml (ref 9–269)

## 2013-06-03 LAB — TECHNOLOGIST REVIEW

## 2013-06-04 ENCOUNTER — Telehealth: Payer: Self-pay | Admitting: Hematology and Oncology

## 2013-06-04 LAB — VITAMIN B12: Vitamin B-12: 436 pg/mL (ref 211–911)

## 2013-06-04 LAB — ERYTHROPOIETIN: Erythropoietin: 42.6 m[IU]/mL — ABNORMAL HIGH (ref 2.6–18.5)

## 2013-06-04 NOTE — Telephone Encounter (Signed)
lmonvm advising the pt of the lab appt.

## 2013-06-05 ENCOUNTER — Encounter (INDEPENDENT_AMBULATORY_CARE_PROVIDER_SITE_OTHER): Payer: BC Managed Care – PPO | Admitting: General Surgery

## 2013-06-06 ENCOUNTER — Ambulatory Visit (INDEPENDENT_AMBULATORY_CARE_PROVIDER_SITE_OTHER): Payer: BC Managed Care – PPO | Admitting: General Surgery

## 2013-06-06 DIAGNOSIS — S31109A Unspecified open wound of abdominal wall, unspecified quadrant without penetration into peritoneal cavity, initial encounter: Secondary | ICD-10-CM

## 2013-06-06 MED ORDER — ONDANSETRON HCL 4 MG PO TABS: 4.0000 mg | ORAL_TABLET | Freq: Three times a day (TID) | ORAL | Status: DC | PRN

## 2013-06-06 NOTE — Progress Notes (Signed)
Subjective:     Patient ID: [name], female   DOB: 27-Oct-1950, 63 y.o.   MRN: [id number]  HPI  She is here for followup of her chronic open abdominal wound. Since I saw her last, she had one episode of bleeding from the wound which she treated with direct pressure and silver nitrate.  She has occasional nausea and takes Zofran for this.   Review of SystemsShe feels a little dizzy at times today.     Objective:   Physical Exam Gen.-she is thin and is in no acute distress.  Abdomen-loop ileostomy in the right abdomen is present. The wound continues to decrease in size.. There is some floating mesh which I debrided. No bleeding from the wound. I redressed the wound.    Assessment:     Chronic open abdominal wound that continues to slowly close. Some of the floating mesh was debrided.     Plan:     Continue current wound care. Return visit one month.

## 2013-06-06 NOTE — Patient Instructions (Signed)
Take a chewable multivitamin with iron. Continue current wound care.

## 2013-06-19 ENCOUNTER — Encounter (INDEPENDENT_AMBULATORY_CARE_PROVIDER_SITE_OTHER): Payer: Self-pay

## 2013-07-02 ENCOUNTER — Ambulatory Visit (HOSPITAL_COMMUNITY)
Admission: RE | Admit: 2013-07-02 | Discharge: 2013-07-02 | Disposition: A | Discharge status: Home / Self Care | Payer: BC Managed Care – PPO | Source: Ambulatory Visit | Attending: Hematology and Oncology | Admitting: Hematology and Oncology

## 2013-07-02 ENCOUNTER — Telehealth: Payer: Self-pay | Admitting: *Deleted

## 2013-07-02 ENCOUNTER — Other Ambulatory Visit (HOSPITAL_BASED_OUTPATIENT_CLINIC_OR_DEPARTMENT_OTHER): Payer: BC Managed Care – PPO

## 2013-07-02 ENCOUNTER — Other Ambulatory Visit: Payer: Self-pay | Admitting: Hematology and Oncology

## 2013-07-02 DIAGNOSIS — D649 Anemia, unspecified: Secondary | ICD-10-CM

## 2013-07-02 DIAGNOSIS — C9310 Chronic myelomonocytic leukemia not having achieved remission: Secondary | ICD-10-CM

## 2013-07-02 DIAGNOSIS — C9210 Chronic myeloid leukemia, BCR/ABL-positive, not having achieved remission: Secondary | ICD-10-CM

## 2013-07-02 LAB — TECHNOLOGIST REVIEW

## 2013-07-02 LAB — CBC WITH DIFFERENTIAL/PLATELET
BASO%: 1.1 % (ref 0.0–2.0)
Basophils Absolute: 0.1 10*3/uL (ref 0.0–0.1)
EOS ABS: 0.1 10*3/uL (ref 0.0–0.5)
EOS%: 1 % (ref 0.0–7.0)
HEMATOCRIT: 20.6 % — AB (ref 34.8–46.6)
HGB: 6.7 g/dL — CL (ref 11.6–15.9)
LYMPH#: 1.2 10*3/uL (ref 0.9–3.3)
LYMPH%: 20.3 % (ref 14.0–49.7)
MCH: 32.6 pg (ref 25.1–34.0)
MCHC: 32.4 g/dL (ref 31.5–36.0)
MCV: 100.7 fL (ref 79.5–101.0)
MONO#: 1.7 10*3/uL — AB (ref 0.1–0.9)
MONO%: 27.4 % — ABNORMAL HIGH (ref 0.0–14.0)
NEUT%: 50.2 % (ref 38.4–76.8)
NEUTROS ABS: 3 10*3/uL (ref 1.5–6.5)
Platelets: 284 10*3/uL (ref 145–400)
RBC: 2.04 10*6/uL — ABNORMAL LOW (ref 3.70–5.45)
RDW: 17.9 % — ABNORMAL HIGH (ref 11.2–14.5)
WBC: 6.1 10*3/uL (ref 3.9–10.3)

## 2013-07-02 LAB — HOLD TUBE, BLOOD BANK

## 2013-07-02 NOTE — Telephone Encounter (Signed)
Pt returned call and was informed of low Hgb and appt for transfusion tomorrow at 1 pm.  She verbalized understanding.

## 2013-07-02 NOTE — Telephone Encounter (Signed)
Hgb 6.7.  Called for and looked for pt in lobby.  Could not find her.  Called pt on both home and cell phone numbers and left VM asking her to call nurse back ASAP about her lab results.  (We scheduled pt for 2 units of blood tomorrow at 1 pm. )

## 2013-07-03 ENCOUNTER — Ambulatory Visit: Payer: BC Managed Care – PPO

## 2013-07-03 VITALS — BP 134/79 | HR 92 | Temp 98.0°F | Resp 18

## 2013-07-03 DIAGNOSIS — D649 Anemia, unspecified: Secondary | ICD-10-CM

## 2013-07-03 LAB — PREPARE RBC (CROSSMATCH)

## 2013-07-03 MED ORDER — SODIUM CHLORIDE 0.9 % IV SOLN: 250.0000 mL | Freq: Once | INTRAVENOUS | Status: DC

## 2013-07-03 NOTE — Patient Instructions (Signed)
Blood Transfusion Information WHAT IS A BLOOD TRANSFUSION? A transfusion is the replacement of blood or some of its parts. Blood is made up of multiple cells which provide different functions.  Red blood cells carry oxygen and are used for blood loss replacement.  White blood cells fight against infection.  Platelets control bleeding.  Plasma helps clot blood.  Other blood products are available for specialized needs, such as hemophilia or other clotting disorders. BEFORE THE TRANSFUSION  Who gives blood for transfusions?   You may be able to donate blood to be used at a later date on yourself (autologous donation).  Relatives can be asked to donate blood. This is generally not any safer than if you have received blood from a stranger. The same precautions are taken to ensure safety when a relative's blood is donated.  Healthy volunteers who are fully evaluated to make sure their blood is safe. This is blood bank blood. Transfusion therapy is the safest it has ever been in the practice of medicine. Before blood is taken from a donor, a complete history is taken to make sure that person has no history of diseases nor engages in risky social behavior (examples are intravenous drug use or sexual activity with multiple partners). The donor's travel history is screened to minimize risk of transmitting infections, such as malaria. The donated blood is tested for signs of infectious diseases, such as HIV and hepatitis. The blood is then tested to be sure it is compatible with you in order to minimize the chance of a transfusion reaction. If you or a relative donates blood, this is often done in anticipation of surgery and is not appropriate for emergency situations. It takes many days to process the donated blood. RISKS AND COMPLICATIONS Although transfusion therapy is very safe and saves many lives, the main dangers of transfusion include:   Getting an infectious disease.  Developing a  transfusion reaction. This is an allergic reaction to something in the blood you were given. Every precaution is taken to prevent this. The decision to have a blood transfusion has been considered carefully by your caregiver before blood is given. Blood is not given unless the benefits outweigh the risks. AFTER THE TRANSFUSION  Right after receiving a blood transfusion, you will usually feel much better and more energetic. This is especially true if your red blood cells have gotten low (anemic). The transfusion raises the level of the red blood cells which carry oxygen, and this usually causes an energy increase.  The nurse administering the transfusion will monitor you carefully for complications. HOME CARE INSTRUCTIONS  No special instructions are needed after a transfusion. You may find your energy is better. Speak with your caregiver about any limitations on activity for underlying diseases you may have. SEEK MEDICAL CARE IF:   Your condition is not improving after your transfusion.  You develop redness or irritation at the intravenous (IV) site. SEEK IMMEDIATE MEDICAL CARE IF:  Any of the following symptoms occur over the next 12 hours:  Shaking chills.  You have a temperature by mouth above 102 F (38.9 C), not controlled by medicine.  Chest, back, or muscle pain.  People around you feel you are not acting correctly or are confused.  Shortness of breath or difficulty breathing.  Dizziness and fainting.  You get a rash or develop hives.  You have a decrease in urine output.  Your urine turns a dark color or changes to pink, red, or brown. Any of the following   symptoms occur over the next 10 days:  You have a temperature by mouth above 102 F (38.9 C), not controlled by medicine.  Shortness of breath.  Weakness after normal activity.  The white part of the eye turns yellow (jaundice).  You have a decrease in the amount of urine or are urinating less often.  Your  urine turns a dark color or changes to pink, red, or brown. Document Released: 04/01/2000 Document Revised: 06/27/2011 Document Reviewed: 11/19/2007 [employer] Patient Information 2014 [employer].  

## 2013-07-04 LAB — TYPE AND SCREEN
ABO/RH(D): A POS
Antibody Screen: POSITIVE
DAT, IGG: POSITIVE
Donor AG Type: NEGATIVE
Donor AG Type: NEGATIVE
UNIT DIVISION: 0
UNIT DIVISION: 0

## 2013-07-08 ENCOUNTER — Encounter (INDEPENDENT_AMBULATORY_CARE_PROVIDER_SITE_OTHER): Payer: BC Managed Care – PPO | Admitting: General Surgery

## 2013-07-11 ENCOUNTER — Telehealth (INDEPENDENT_AMBULATORY_CARE_PROVIDER_SITE_OTHER): Payer: Self-pay

## 2013-07-11 NOTE — Telephone Encounter (Signed)
Noted and agree. 

## 2013-07-11 NOTE — Telephone Encounter (Signed)
I advised Nicki Reaper I would need to speak with pt since he is not noted on her release.  Pt states she twisted yesterday and has injured her back. She wants to see Dr Zella Richer for this. Pts states wound is doing well and this does not involve her wound. I advised pt that she will need to see her PCP or go to an urgent care clinic to have back pain evaluated. I advised her our office does not treat back injuries and back pain. Pt states she understands.

## 2013-07-15 ENCOUNTER — Telehealth: Payer: Self-pay | Admitting: Hematology and Oncology

## 2013-07-15 NOTE — Telephone Encounter (Signed)
cld pt lefy message that Dr Lottie Rater chgd appt on 5/13 to 12:00. Will mail a sch as well

## 2013-07-22 ENCOUNTER — Encounter (INDEPENDENT_AMBULATORY_CARE_PROVIDER_SITE_OTHER): Payer: BC Managed Care – PPO | Admitting: General Surgery

## 2013-07-22 ENCOUNTER — Ambulatory Visit (INDEPENDENT_AMBULATORY_CARE_PROVIDER_SITE_OTHER): Payer: BC Managed Care – PPO | Admitting: General Surgery

## 2013-07-22 ENCOUNTER — Encounter (INDEPENDENT_AMBULATORY_CARE_PROVIDER_SITE_OTHER): Payer: Self-pay | Admitting: General Surgery

## 2013-07-22 VITALS — BP 124/72 | HR 74 | Temp 97.5°F | Resp 14 | Ht 63.0 in | Wt 135.2 lb

## 2013-07-22 DIAGNOSIS — S31109A Unspecified open wound of abdominal wall, unspecified quadrant without penetration into peritoneal cavity, initial encounter: Secondary | ICD-10-CM

## 2013-07-22 NOTE — Patient Instructions (Signed)
Continue wound care as you are doing.

## 2013-07-22 NOTE — Progress Notes (Signed)
Subjective:     Patient ID: [name], female   DOB: 09/28/1950, 63 y.o.   MRN: [id number]  HPI  She is here for followup of her chronic open abdominal wound. Since I saw her last, she still has episodes of bleeding from the wound which she treated with direct pressure and silver nitrate.  She has occasional nausea and takes Zofran for this.  She was significantly anemic and had to be given a blood transfusion. This likely is partially of function of someone bleeding as well as her CMML.  She remains dyspneic most of the time.  She is also hurt her back recently.   Review of Systemsno problems with ileostomy.     Objective:   Physical Exam Gen.-she is thin and is in no acute distress.  Abdomen-loop ileostomy in the right abdomen is present. The wound continues to decrease in size.. There is some floating mesh which I debrided. No bleeding from the wound. I redressed the wound.    Assessment:     Chronic open abdominal wound that continues to slowly close. Some of the floating mesh was debrided. Her other health issues make any type of major surgery unsafe for her in my opinion, at this time.    Plan:     Continue current wound care. Return visit 4 weeks.

## 2013-08-21 ENCOUNTER — Inpatient Hospital Stay (HOSPITAL_COMMUNITY)
Admission: EM | Admit: 2013-08-21 | Discharge: 2013-08-26 | DRG: 811 | Disposition: A | Discharge status: Home / Self Care | Payer: BC Managed Care – PPO | Attending: Internal Medicine | Admitting: Internal Medicine

## 2013-08-21 ENCOUNTER — Encounter (HOSPITAL_COMMUNITY): Payer: Self-pay | Admitting: Emergency Medicine

## 2013-08-21 ENCOUNTER — Ambulatory Visit (INDEPENDENT_AMBULATORY_CARE_PROVIDER_SITE_OTHER): Payer: BC Managed Care – PPO | Admitting: General Surgery

## 2013-08-21 ENCOUNTER — Encounter (INDEPENDENT_AMBULATORY_CARE_PROVIDER_SITE_OTHER): Payer: Self-pay | Admitting: General Surgery

## 2013-08-21 ENCOUNTER — Emergency Department (HOSPITAL_COMMUNITY): Payer: BC Managed Care – PPO

## 2013-08-21 ENCOUNTER — Telehealth: Payer: Self-pay | Admitting: Cardiology

## 2013-08-21 VITALS — BP 116/68 | HR 76 | Temp 97.8°F | Resp 14 | Wt 135.8 lb

## 2013-08-21 DIAGNOSIS — S31109A Unspecified open wound of abdominal wall, unspecified quadrant without penetration into peritoneal cavity, initial encounter: Secondary | ICD-10-CM

## 2013-08-21 DIAGNOSIS — Z79899 Other long term (current) drug therapy: Secondary | ICD-10-CM

## 2013-08-21 DIAGNOSIS — D649 Anemia, unspecified: Secondary | ICD-10-CM

## 2013-08-21 DIAGNOSIS — E538 Deficiency of other specified B group vitamins: Secondary | ICD-10-CM

## 2013-08-21 DIAGNOSIS — E875 Hyperkalemia: Secondary | ICD-10-CM

## 2013-08-21 DIAGNOSIS — T8189XA Other complications of procedures, not elsewhere classified, initial encounter: Secondary | ICD-10-CM | POA: Diagnosis present

## 2013-08-21 DIAGNOSIS — F17200 Nicotine dependence, unspecified, uncomplicated: Secondary | ICD-10-CM | POA: Diagnosis present

## 2013-08-21 DIAGNOSIS — J40 Bronchitis, not specified as acute or chronic: Secondary | ICD-10-CM | POA: Diagnosis present

## 2013-08-21 DIAGNOSIS — Z888 Allergy status to other drugs, medicaments and biological substances status: Secondary | ICD-10-CM

## 2013-08-21 DIAGNOSIS — J189 Pneumonia, unspecified organism: Secondary | ICD-10-CM

## 2013-08-21 DIAGNOSIS — N179 Acute kidney failure, unspecified: Secondary | ICD-10-CM | POA: Diagnosis not present

## 2013-08-21 DIAGNOSIS — Z932 Ileostomy status: Secondary | ICD-10-CM

## 2013-08-21 DIAGNOSIS — D638 Anemia in other chronic diseases classified elsewhere: Principal | ICD-10-CM

## 2013-08-21 DIAGNOSIS — I4891 Unspecified atrial fibrillation: Secondary | ICD-10-CM

## 2013-08-21 DIAGNOSIS — R059 Cough, unspecified: Secondary | ICD-10-CM

## 2013-08-21 DIAGNOSIS — Z880 Allergy status to penicillin: Secondary | ICD-10-CM

## 2013-08-21 DIAGNOSIS — E872 Acidosis, unspecified: Secondary | ICD-10-CM

## 2013-08-21 DIAGNOSIS — Z8249 Family history of ischemic heart disease and other diseases of the circulatory system: Secondary | ICD-10-CM

## 2013-08-21 DIAGNOSIS — Z9049 Acquired absence of other specified parts of digestive tract: Secondary | ICD-10-CM

## 2013-08-21 DIAGNOSIS — I10 Essential (primary) hypertension: Secondary | ICD-10-CM | POA: Diagnosis present

## 2013-08-21 DIAGNOSIS — Z418 Encounter for other procedures for purposes other than remedying health state: Secondary | ICD-10-CM

## 2013-08-21 DIAGNOSIS — Z2989 Encounter for other specified prophylactic measures: Secondary | ICD-10-CM

## 2013-08-21 DIAGNOSIS — D630 Anemia in neoplastic disease: Secondary | ICD-10-CM | POA: Diagnosis present

## 2013-08-21 DIAGNOSIS — J811 Chronic pulmonary edema: Secondary | ICD-10-CM

## 2013-08-21 DIAGNOSIS — R197 Diarrhea, unspecified: Secondary | ICD-10-CM

## 2013-08-21 DIAGNOSIS — R0600 Dyspnea, unspecified: Secondary | ICD-10-CM | POA: Diagnosis present

## 2013-08-21 DIAGNOSIS — Z823 Family history of stroke: Secondary | ICD-10-CM

## 2013-08-21 DIAGNOSIS — E871 Hypo-osmolality and hyponatremia: Secondary | ICD-10-CM

## 2013-08-21 DIAGNOSIS — E860 Dehydration: Secondary | ICD-10-CM

## 2013-08-21 DIAGNOSIS — J9600 Acute respiratory failure, unspecified whether with hypoxia or hypercapnia: Secondary | ICD-10-CM

## 2013-08-21 DIAGNOSIS — C9310 Chronic myelomonocytic leukemia not having achieved remission: Secondary | ICD-10-CM | POA: Diagnosis present

## 2013-08-21 DIAGNOSIS — R05 Cough: Secondary | ICD-10-CM

## 2013-08-21 DIAGNOSIS — Y836 Removal of other organ (partial) (total) as the cause of abnormal reaction of the patient, or of later complication, without mention of misadventure at the time of the procedure: Secondary | ICD-10-CM | POA: Diagnosis present

## 2013-08-21 DIAGNOSIS — K219 Gastro-esophageal reflux disease without esophagitis: Secondary | ICD-10-CM | POA: Diagnosis present

## 2013-08-21 DIAGNOSIS — Z7982 Long term (current) use of aspirin: Secondary | ICD-10-CM

## 2013-08-21 DIAGNOSIS — Z22322 Carrier or suspected carrier of Methicillin resistant Staphylococcus aureus: Secondary | ICD-10-CM

## 2013-08-21 DIAGNOSIS — C9210 Chronic myeloid leukemia, BCR/ABL-positive, not having achieved remission: Secondary | ICD-10-CM | POA: Diagnosis present

## 2013-08-21 DIAGNOSIS — K551 Chronic vascular disorders of intestine: Secondary | ICD-10-CM

## 2013-08-21 DIAGNOSIS — Z96649 Presence of unspecified artificial hip joint: Secondary | ICD-10-CM

## 2013-08-21 DIAGNOSIS — R112 Nausea with vomiting, unspecified: Secondary | ICD-10-CM | POA: Diagnosis present

## 2013-08-21 DIAGNOSIS — I4892 Unspecified atrial flutter: Secondary | ICD-10-CM | POA: Diagnosis present

## 2013-08-21 HISTORY — DX: Carrier or suspected carrier of methicillin resistant Staphylococcus aureus: Z22.322

## 2013-08-21 LAB — BASIC METABOLIC PANEL
BUN: 13 mg/dL (ref 6–23)
CO2: 16 mEq/L — ABNORMAL LOW (ref 19–32)
CREATININE: 0.81 mg/dL (ref 0.50–1.10)
Calcium: 6.8 mg/dL — ABNORMAL LOW (ref 8.4–10.5)
Chloride: 95 mEq/L — ABNORMAL LOW (ref 96–112)
GFR calc non Af Amer: 75 mL/min — ABNORMAL LOW (ref >90)
GFR, EST AFRICAN AMERICAN: 87 mL/min — AB (ref >90)
Glucose, Bld: 79 mg/dL (ref 70–99)
Potassium: 5.7 mEq/L — ABNORMAL HIGH (ref 3.7–5.3)
Sodium: 128 mEq/L — ABNORMAL LOW (ref 137–147)

## 2013-08-21 LAB — CBC WITH DIFFERENTIAL/PLATELET
BASOS ABS: 0 10*3/uL (ref 0.0–0.1)
Basophils Relative: 1 % (ref 0–1)
Eosinophils Absolute: 0.1 10*3/uL (ref 0.0–0.7)
Eosinophils Relative: 2 % (ref 0–5)
HCT: 20 % — ABNORMAL LOW (ref 36.0–46.0)
Hemoglobin: 6.8 g/dL — CL (ref 12.0–15.0)
LYMPHS PCT: 30 % (ref 12–46)
Lymphs Abs: 1.2 10*3/uL (ref 0.7–4.0)
MCH: 32.1 pg (ref 26.0–34.0)
MCHC: 34 g/dL (ref 30.0–36.0)
MCV: 94.3 fL (ref 78.0–100.0)
Monocytes Absolute: 1.1 10*3/uL — ABNORMAL HIGH (ref 0.1–1.0)
Monocytes Relative: 27 % — ABNORMAL HIGH (ref 3–12)
Neutro Abs: 1.7 10*3/uL (ref 1.7–7.7)
Neutrophils Relative %: 40 % — ABNORMAL LOW (ref 43–77)
PLATELETS: 179 10*3/uL (ref 150–400)
RBC: 2.12 MIL/uL — ABNORMAL LOW (ref 3.87–5.11)
RDW: 18.6 % — AB (ref 11.5–15.5)
WBC: 4.1 10*3/uL (ref 4.0–10.5)

## 2013-08-21 LAB — PREPARE RBC (CROSSMATCH)

## 2013-08-21 MED ORDER — ALBUTEROL SULFATE HFA 108 (90 BASE) MCG/ACT IN AERS
2.0000 | INHALATION_SPRAY | Freq: Once | RESPIRATORY_TRACT | Status: AC
  Administered 2013-08-21: 2 via RESPIRATORY_TRACT
  Filled 2013-08-21: qty 6.7

## 2013-08-21 MED ORDER — LEVOFLOXACIN 500 MG PO TABS
500.0000 mg | ORAL_TABLET | Freq: Once | ORAL | Status: AC
  Administered 2013-08-21: 500 mg via ORAL
  Filled 2013-08-21: qty 1

## 2013-08-21 NOTE — ED Notes (Signed)
Pt cough, SOB, and dizziness x 2 weeks, pt states dizziness probably d/t dehydration. Pt seen by doctor this morning and was told she probably has pneumonia and she should come to ED. Pt denies pain. Denies N/V.

## 2013-08-21 NOTE — Telephone Encounter (Signed)
New Prob    Pt calling to see if she can be seen today. States she was seen by Dr. Zella Richer and was told she should be seen for pneumonia or pulmonary edema. Please call.

## 2013-08-21 NOTE — ED Notes (Signed)
Bed: WA04 Expected date:  Expected time:  Means of arrival:  Comments: Cancer pt

## 2013-08-21 NOTE — Progress Notes (Signed)
Subjective:     Patient ID: [name], female   DOB: October 06, 1950, 63 y.o.   MRN: [id number]  HPI  She is here for followup of her chronic open abdominal wound. She still has episodes of bleeding from the wound which she treated with direct pressure and silver nitrate.  Today, she is having some problems with shortness of breath, low energy, and a cough. She is concerned she may have pneumonia. She has not sought medical attention for this. Her daughter is very concerned about her.   Review of SystemsShe feels like she may be dehydrated.     Objective:   Physical Exam Gen.-she is thin and does not look well Lungs-Coarse rhonchi in the right base  Abdomen-loop ileostomy in the right abdomen is present. The wound has substantially decreased in size over the last month.    Assessment:     Chronic open abdominal wound that continues to slowly close.  Has signs and symptoms of a pulmonary infection.     Plan:     Continue current wound care. She has been advised to see her primary care physician today or go to urgent care or the emergency department. Return visit one month.

## 2013-08-21 NOTE — ED Provider Notes (Signed)
CSN: [id number]     Arrival date & time 08/21/13  1800 History   First MD Initiated Contact with Patient 08/21/13 1822     Chief Complaint  Patient presents with  . Pneumonia     HPI Patient presents emergency department for increasing exertional shortness of apparatus without orthopnea the past 2 weeks.  She's also had some dizziness.  She has a history of anemia requiring blood transfusion before in the past.  She was seen in general surgery office today for routine followup and was told that she had possible rhonchi in her bilateral bases and recommended evaluation by a physician for possible pneumonia.  Patient does report productive cough.  She's had no fevers or chills.  No prior history of DVT or pulmonary embolism.  She has a history of total colectomy secondary to ischemic colitis.  She has no ileostomy.  She's had normal output from her ileostomy and denies melena.  The patient is a Designer, jewellery   Past Medical History  Diagnosis Date  . Hypertension     She has a past hx of essential  . 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 Inflammation of joint of knee     Since we last last saw her she developed problems with an acute which required surgical drainage by her orthopedist Dr. Durward Fortes.  Marland Kitchen MRSA (methicillin resistant Staphylococcus aureus)     Knee surgery drainage was positive for MRSA and she was treated with 3 weeks of doxycycline successfully.  . Diarrhea     Mild  . Exogenous obesity   . GERD (gastroesophageal reflux disease)     2 hosp.- ischemic colitis - residual of Norovirus, 05/2011- sm. bowel obstruction  . Headache(784.0)     migraine headache on occas, less now than when she was younger   . Arthritis     L hip, back, neck   . History of blood transfusion sept 2013    04/2011- /w ischemic colitis , trouble with matching blood  sept 2013  . Anemia     will see hematology consult  prior to surgery, recommended by Dr. Mare Ferrari  . Anemia 12/15/2010  . Ischemic colitis 01/31/2012  . Atrial flutter     during hospitalization, 04/2011- related to anemia & illness/stress   . Pneumonia     04/2011- not hospitalized , pt. denies SOB, changes in chest, breathing  . 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 Dizziness     occasional  . B12 deficiency 02/27/2013   Past Surgical History  Procedure Laterality Date  . Knee surgery      I&D- 2008, post laceration   . Colonoscopy  05/16/2011    Procedure: COLONOSCOPY;  Surgeon: Winfield Cunas., MD;  Location: [facility];  Service: Endoscopy;  Laterality: N/A;  . Small intestine surgery  1992, 1999  . Laparotomy and lysis of adhesions    . Total hip arthroplasty  10/25/2011    Procedure: TOTAL HIP ARTHROPLASTY;  Surgeon: Garald Balding, MD;  Location: [place];  Service: Orthopedics;  Laterality: Left;  . Appendectomy  1962  . Abdominal hysterectomy  1988  . Partial colectomy and colostomy  sept 2013    mucous fistula done  . Partial colectomy  05/17/2012    Procedure: PARTIAL COLECTOMY;  Surgeon: Odis Hollingshead, MD;  Location: [facility];  Service: General;  Laterality: N/A;  .  Colostomy closure  05/17/2012    Procedure: COLOSTOMY CLOSURE;  Surgeon: Odis Hollingshead, MD;  Location: [facility];  Service: General;  Laterality: N/A;  . Laparotomy  05/17/2012    Procedure: EXPLORATORY LAPAROTOMY;  Surgeon: Odis Hollingshead, MD;  Location: [facility];  Service: General;;  . Lysis of adhesion  05/17/2012    Procedure: LYSIS OF ADHESION;  Surgeon: Odis Hollingshead, MD;  Location: [facility];  Service: General;;  . Esophageal biopsy  05/17/2012    Procedure: BIOPSY;  Surgeon: Odis Hollingshead, MD;  Location: [facility];  Service: General;;  omental biopsy  . Laparotomy  05/22/2012    Procedure: EXPLORATORY LAPAROTOMY;  Surgeon: Odis Hollingshead, MD;  Location: [facility];  Service: General;  Laterality: N/A;  DRAINAGE  INTRA-ABDOMINAL  ABSCESS/LYSIS OF ADHESIONSFOR SMALL BOWEL OBSTRUCTION/DIVERTING LOOP ILEOSTOMY   Family History  Problem Relation Age of Onset  . Stroke Father   . Atrial fibrillation Father   . Hypertension Mother    History  Substance Use Topics  . Smoking status: Current Some Day Smoker -- 0.25 packs/day for 20 years    Types: Cigarettes    Last Attempt to Quit: 03/21/2013  . Smokeless tobacco: Never Used  . Alcohol Use: No     Comment: 1 - 2 glasses of wine per week   OB History   Grav Para Term Preterm Abortions TAB SAB Ect Mult Living                 Review of Systems  All other systems reviewed and are negative.     Allergies  Vancomycin; Ativan; Codeine; Tetanus toxoids; Penicillins; and Xarelto  Home Medications   Prior to Admission medications   Medication Sig Start Date End Date Taking? Authorizing Provider  albuterol (PROVENTIL HFA;VENTOLIN HFA) 108 (90 BASE) MCG/ACT inhaler Inhale 1-2 puffs into the lungs every 6 (six) hours as needed for wheezing.    Historical Provider, MD  aspirin 325 MG EC tablet Take 325 mg by mouth daily.    Historical Provider, MD  benzonatate (TESSALON) 200 MG capsule Take 1 capsule (200 mg total) by mouth 3 (three) times daily as needed for cough. 03/27/13   Sheila Oats, MD  bisoprolol (ZEBETA) 5 MG tablet Take 1 tablet (5 mg total) by mouth daily before breakfast. 08/17/12   Darlin Coco, MD  buPROPion (WELLBUTRIN XL) 300 MG 24 hr tablet Take 300 mg by mouth daily before breakfast.     Historical Provider, MD  celecoxib (CELEBREX) 200 MG capsule Take 200 mg by mouth daily.    Historical Provider, MD  cyclobenzaprine (FLEXERIL) 10 MG tablet  07/12/13   Historical Provider, MD  dextromethorphan-guaiFENesin (MUCINEX DM) 30-600 MG per 12 hr tablet Take 1 tablet by mouth 2 (two) times daily. 03/27/13   Sheila Oats, MD  diphenoxylate-atropine (LOMOTIL) 2.5-0.025 MG per tablet Take 1 tablet by mouth 4 (four) times daily as needed for diarrhea or  loose stools.     Historical Provider, MD  diphenoxylate-atropine (LOMOTIL) 2.5-0.025 MG per tablet Take 1 tablet by mouth 4 (four) times daily as needed for diarrhea or loose stools. 05/29/13   Odis Hollingshead, MD  esomeprazole (NEXIUM) 40 MG capsule Take 40 mg by mouth every morning.     Historical Provider, MD  estradiol (ESTRACE) 2 MG tablet Take 2 mg by mouth daily before breakfast.     Historical Provider, MD  furosemide (LASIX) 40 MG tablet Take 40 mg by mouth as  needed for fluid or edema.  12/24/12   Historical Provider, MD  HYDROcodone-acetaminophen [facility]) 10-325 MG per tablet  07/13/13   Historical Provider, MD  loperamide (IMODIUM) 2 MG capsule Take 2 mg by mouth 4 (four) times daily as needed for diarrhea or loose stools.    Historical Provider, MD  loratadine-pseudoephedrine (CLARITIN-D 12-HOUR) 5-120 MG per tablet Take 1 tablet by mouth daily as needed for allergies.     Historical Provider, MD  ondansetron (ZOFRAN) 4 MG tablet Take 1 tablet (4 mg total) by mouth every 8 (eight) hours as needed for nausea or vomiting. 06/06/13   Odis Hollingshead, MD  silver nitrate applicators 14-78 % applicator Apply 1 Stick topically every 5 (five) minutes x 3 doses as needed (Please send 4 doses now/Apply to bleeders in abd incision stat). 12/24/12   Hosie Poisson, MD  tiotropium (SPIRIVA) 18 MCG inhalation capsule Place 1 capsule (18 mcg total) into inhaler and inhale daily. 01/11/13   Eugenie Filler, MD  vitamin B-12 2000 MCG tablet Take 1 tablet (2,000 mcg total) by mouth daily. 03/27/13   Sheila Oats, MD  zinc sulfate 220 MG capsule Take 1 capsule (220 mg total) by mouth 2 (two) times daily. 03/27/13   Sheila Oats, MD   BP 137/79  Pulse 108  Temp(Src) 98.1 F (36.7 C) (Oral)  Resp 16  SpO2 90% Physical Exam  Nursing note and vitals reviewed. Constitutional: She is oriented to person, place, and time. She appears well-developed and well-nourished. No distress.  HENT:  Head:  Normocephalic and atraumatic.  Eyes: EOM are normal.  Neck: Normal range of motion.  Cardiovascular: Normal rate, regular rhythm and normal heart sounds.   Pulmonary/Chest: Effort normal and breath sounds normal.  Rhonchi bilateral lower lobes  Abdominal: Soft. She exhibits no distension. There is no tenderness.  Musculoskeletal: Normal range of motion.  Neurological: She is alert and oriented to person, place, and time.  Skin: Skin is warm and dry.  Psychiatric: She has a normal mood and affect. Judgment normal.    ED Course  Procedures (including critical care time)  Apiration of blood Performed by: Hoy Morn Consent obtained. Required items: required blood products, implants, devices, and special equipment available Patient identity confirmed: verbally with patient Time out: Immediately prior to procedure a "time out" was called to verify the correct patient, procedure, equipment, support staff and site/side marked as required. Preparation: Patient was prepped and draped in the usual sterile fashion. Patient tolerance: Patient tolerated the procedure well with no immediate complications. Location of aspiration: left antecubital fossa ULTRASOUND guided aspiration     Labs Review Labs Reviewed  BASIC METABOLIC PANEL - Abnormal; Notable for the following:    Sodium 128 (*)    Potassium 5.7 (*)    Chloride 95 (*)    CO2 16 (*)    Calcium 6.8 (*)    GFR calc non Af Amer 75 (*)    GFR calc Af Amer 87 (*)    All other components within normal limits  CBC WITH DIFFERENTIAL - Abnormal; Notable for the following:    RBC 2.12 (*)    Hemoglobin 6.8 (*)    HCT 20.0 (*)    RDW 18.6 (*)    Neutrophils Relative % 40 (*)    Monocytes Relative 27 (*)    Monocytes Absolute 1.1 (*)    All other components within normal limits  D-DIMER, QUANTITATIVE  PRO B NATRIURETIC PEPTIDE  TYPE AND  SCREEN  PREPARE RBC (CROSSMATCH)    Imaging Review Dg Chest 2 View  08/21/2013    CLINICAL DATA:  Cough, shortness of breath, dizziness  EXAM: CHEST  2 VIEW  COMPARISON:  DG CHEST 1V PORT dated 03/25/2013  FINDINGS: There is bilateral diffuse interstitial and patchy alveolar airspace opacities, right greater than left. There is no pleural effusion or pneumothorax. Stable cardiomediastinal silhouette. Unremarkable osseous structures.  IMPRESSION: Bilateral diffuse interstitial and patchy alveolar airspace opacities, right greater than left concerning for multilobar pneumonia including atypical etiologies versus pulmonary edema.   Electronically Signed   By: Kathreen Devoid   On: 08/21/2013 19:21  I personally reviewed the imaging tests through PACS system I reviewed available ER/hospitalization records through the EMR    EKG Interpretation None      MDM   Final diagnoses:  Symptomatic anemia    Patient will be admitted for blood transfusion.  Symptomatic anemia with exertional shortness of breath.  Questionable pneumonia.  Cover with Levaquin.    Hoy Morn, MD 08/21/13 [id number]

## 2013-08-21 NOTE — Telephone Encounter (Signed)
Discussed with  Dr. Mare Ferrari and with patients complex medical history advised patient to go to Urgent Care or ED, verbalized understanding.

## 2013-08-21 NOTE — ED Notes (Signed)
Patient transported to X-ray 

## 2013-08-21 NOTE — Patient Instructions (Signed)
Continue current wound care. You need to be seen by a primary care physician today.

## 2013-08-21 NOTE — H&P (Signed)
[facility]                                                                                    Patient Demographics  [name], is a 63 y.o. female  CSN: [id number]  MRN: [id number]  DOB - 15-Jun-1950  Admit Date - 08/21/2013  Outpatient Primary MD for the patient is Darlin Coco, MD   With History of -  Past Medical History  Diagnosis Date  . Hypertension     She has a past hx of essential  . 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 Inflammation of joint of knee     Since we last last saw her she developed problems with an acute which required surgical drainage by her orthopedist Dr. Durward Fortes.  Marland Kitchen MRSA (methicillin resistant Staphylococcus aureus)     Knee surgery drainage was positive for MRSA and she was treated with 3 weeks of doxycycline successfully.  . Diarrhea     Mild  . Exogenous obesity   . GERD (gastroesophageal reflux disease)     2 hosp.- ischemic colitis - residual of Norovirus, 05/2011- sm. bowel obstruction  . Headache(784.0)     migraine headache on occas, less now than when she was younger   . Arthritis     L hip, back, neck   . History of blood transfusion sept 2013    04/2011- /w ischemic colitis , trouble with matching blood  sept 2013  . Anemia     will see hematology consult prior to surgery, recommended by Dr. Mare Ferrari  . Anemia 12/15/2010  . Ischemic colitis 01/31/2012  . Atrial flutter     during hospitalization, 04/2011- related to anemia & illness/stress   . Pneumonia     04/2011- not hospitalized , pt. denies SOB, changes in chest, breathing  . 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 Dizziness     occasional  . B12 deficiency 02/27/2013      Past Surgical History  Procedure Laterality Date  . Knee surgery      I&D- 2008, post laceration   . Colonoscopy  05/16/2011    Procedure: COLONOSCOPY;  Surgeon: Winfield Cunas., MD;   Location: [facility];  Service: Endoscopy;  Laterality: N/A;  . Small intestine surgery  1992, 1999  . Laparotomy and lysis of adhesions    . Total hip arthroplasty  10/25/2011    Procedure: TOTAL HIP ARTHROPLASTY;  Surgeon: Garald Balding, MD;  Location: [place];  Service: Orthopedics;  Laterality: Left;  . Appendectomy  1962  . Abdominal hysterectomy  1988  . Partial colectomy and colostomy  sept 2013    mucous fistula done  . Partial colectomy  05/17/2012    Procedure: PARTIAL COLECTOMY;  Surgeon: Odis Hollingshead, MD;  Location: [facility];  Service: General;  Laterality: N/A;  . Colostomy closure  05/17/2012    Procedure: COLOSTOMY CLOSURE;  Surgeon: Odis Hollingshead, MD;  Location: [facility];  Service: General;  Laterality: N/A;  . Laparotomy  05/17/2012    Procedure: EXPLORATORY LAPAROTOMY;  Surgeon: Odis Hollingshead, MD;  Location: [facility];  Service: General;;  . Lysis of adhesion  05/17/2012    Procedure: LYSIS OF ADHESION;  Surgeon: Odis Hollingshead, MD;  Location: [facility];  Service: General;;  . Esophageal biopsy  05/17/2012    Procedure: BIOPSY;  Surgeon: Odis Hollingshead, MD;  Location: [facility];  Service: General;;  omental biopsy  . Laparotomy  05/22/2012    Procedure: EXPLORATORY LAPAROTOMY;  Surgeon: Odis Hollingshead, MD;  Location: [facility];  Service: General;  Laterality: N/A;  DRAINAGE  INTRA-ABDOMINAL ABSCESS/LYSIS OF ADHESIONSFOR SMALL BOWEL OBSTRUCTION/DIVERTING LOOP ILEOSTOMY    in for   Chief Complaint  Patient presents with  . Pneumonia     HPI  [name]  is a 63 y.o. female, with history of CMML and total colectomy with high output ileostomy and previous admissions for symptomatic anemia presenting today with 2 weeks history of increasing dyspnea on exertion. No fever or chills no nausea or vomiting. She complains of mild cough. No headache mild dizziness    Review of Systems    In addition to the HPI above,  No Fever-chills, No Headache, No changes with  Vision or hearing, No problems swallowing food or Liquids, No Chest pain, , No Abdominal pain, No Nausea or Vommitting, Bowel movements are regular, No Blood in stool or Urine, No dysuria, No new skin rashes or bruises, No new joints pains-aches,  No new weakness, tingling, numbness in any extremity, No recent weight gain or loss, No polyuria, polydypsia or polyphagia, No significant Mental Stressors.  A full 10 point Review of Systems was done, except as stated above, all other Review of Systems were negative.   Social History History  Substance Use Topics  . Smoking status: Current Some Day Smoker -- 0.25 packs/day for 20 years    Types: Cigarettes    Last Attempt to Quit: 03/21/2013  . Smokeless tobacco: Never Used  . Alcohol Use: No     Comment: 1 - 2 glasses of wine per week     Family History Family History  Problem Relation Age of Onset  . Stroke Father   . Atrial fibrillation Father   . Hypertension Mother      Prior to Admission medications   Medication Sig Start Date End Date Taking? Authorizing Provider  albuterol (PROVENTIL HFA;VENTOLIN HFA) 108 (90 BASE) MCG/ACT inhaler Inhale 1-2 puffs into the lungs every 6 (six) hours as needed for wheezing.   Yes Historical Provider, MD  aspirin 325 MG EC tablet Take 325 mg by mouth daily.   Yes Historical Provider, MD  bisoprolol (ZEBETA) 5 MG tablet Take 1 tablet (5 mg total) by mouth daily before breakfast. 08/17/12  Yes Darlin Coco, MD  buPROPion (WELLBUTRIN XL) 300 MG 24 hr tablet Take 300 mg by mouth daily before breakfast.    Yes Historical Provider, MD  celecoxib (CELEBREX) 200 MG capsule Take 200 mg by mouth daily.   Yes Historical Provider, MD  cyclobenzaprine (FLEXERIL) 10 MG tablet Take 10 mg by mouth 3 (three) times daily as needed for muscle spasms.  07/12/13  Yes Historical Provider, MD  diphenoxylate-atropine (LOMOTIL) 2.5-0.025 MG per tablet Take 1 tablet by mouth at bedtime.    Yes Historical Provider,  MD  esomeprazole (NEXIUM) 40 MG capsule Take 40 mg by mouth every morning.    Yes Historical Provider, MD  estradiol (ESTRACE) 2 MG tablet Take 2 mg by mouth daily before breakfast.    Yes  Historical Provider, MD  furosemide (LASIX) 40 MG tablet Take 40 mg by mouth as needed for fluid or edema.  12/24/12  Yes Historical Provider, MD  HYDROcodone-acetaminophen (NORCO) 10-325 MG per tablet Take 1 tablet by mouth every 4 (four) hours as needed for moderate pain.  07/13/13  Yes Historical Provider, MD  loperamide (IMODIUM) 2 MG capsule Take 2 mg by mouth 4 (four) times daily as needed for diarrhea or loose stools.   Yes Historical Provider, MD  loratadine-pseudoephedrine (CLARITIN-D 12-HOUR) 5-120 MG per tablet Take 1 tablet by mouth daily as needed for allergies.    Yes Historical Provider, MD  ondansetron (ZOFRAN) 4 MG tablet Take 1 tablet (4 mg total) by mouth every 8 (eight) hours as needed for nausea or vomiting. 06/06/13  Yes Odis Hollingshead, MD    Allergies  Allergen Reactions  . Vancomycin Hives and Rash    ? wheezing  . Ativan [Lorazepam]     confusion  . Codeine Nausea And Vomiting  . Tetanus Toxoids Other (See Comments)    serum  . Penicillins Hives and Rash  . Xarelto [Rivaroxaban] Hives and Rash    ?    Physical Exam  Vitals  Blood pressure 137/79, pulse 108, temperature 98.1 F (36.7 C), temperature source Oral, resp. rate 16, SpO2 90.00%.   1. General elderly white female, extremely pleasant, looks chronically ill  2. Normal affect and insight, Not Suicidal or Homicidal, Awake Alert, Oriented X 3.  3. No F.N deficits, ALL C.Nerves Intact, Strength 5/5 all 4 extremities, Sensation intact all 4 extremities, Plantars down going.  4. Ears and Eyes appear Normal, Conjunctivae pale, PERRLA. Moist Oral Mucosa.  5. Supple Neck, No JVD, No cervical lymphadenopathy appriciated, No Carotid Bruits.  6. Symmetrical Chest wall movement, bilateral crackles, .  7. RRR, No Gallops,  Rubs or Murmurs, No Parasternal Heave.  8. Positive Bowel Sounds, Abdomen Soft, Non tender, No organomegaly appriciated,No rebound -guarding or rigidity. Ileostomy noted, draining  9.  No Cyanosis, Normal Skin Turgor, No Skin Rash or Bruise.  10. Good muscle tone,  joints appear normal , no effusions, Normal ROM.  11. No Palpable Lymph Nodes in Neck or Axillae    Data Review  CBC  Recent Labs Lab 08/21/13 2218  WBC 4.1  HGB 6.8*  HCT 20.0*  PLT 179  MCV 94.3  MCH 32.1  MCHC 34.0  RDW 18.6*  LYMPHSABS 1.2  MONOABS 1.1*  EOSABS 0.1  BASOSABS 0.0   ------------------------------------------------------------------------------------------------------------------  Chemistries   Recent Labs Lab 08/21/13 1833  NA 128*  K 5.7*  CL 95*  CO2 16*  GLUCOSE 79  BUN 13  CREATININE 0.81  CALCIUM 6.8*   ------------------------------------------------------------------------------------------------------------------ CrCl is unknown because both a height and weight (above a minimum accepted value) are required for this calculation. ------------------------------------------------------------------------------------------------------------------ No results found for this basename: TSH, T4TOTAL, FREET3, T3FREE, THYROIDAB,  in the last 72 hours   Coagulation profile No results found for this basename: INR, PROTIME,  in the last 168 hours ------------------------------------------------------------------------------------------------------------------- No results found for this basename: DDIMER,  in the last 72 hours -------------------------------------------------------------------------------------------------------------------  Cardiac Enzymes No results found for this basename: CK, CKMB, TROPONINI, MYOGLOBIN,  in the last 168 hours ------------------------------------------------------------------------------------------------------------------ No components found with  this basename: POCBNP,       Imaging results:   Dg Chest 2 View  08/21/2013   CLINICAL DATA:  Cough, shortness of breath, dizziness  EXAM: CHEST  2 VIEW  COMPARISON:  DG CHEST  1V PORT dated 03/25/2013  FINDINGS: There is bilateral diffuse interstitial and patchy alveolar airspace opacities, right greater than left. There is no pleural effusion or pneumothorax. Stable cardiomediastinal silhouette. Unremarkable osseous structures.  IMPRESSION: Bilateral diffuse interstitial and patchy alveolar airspace opacities, right greater than left concerning for multilobar pneumonia including atypical etiologies versus pulmonary edema.   Electronically Signed   By: Kathreen Devoid   On: 08/21/2013 19:21      Assessment & Plan  1. symptomatic anemia    Type cross match and transfuse 2 units of packed RBCs    Previous admissions for same problem    History of CMML 2. Bronchitis    Start Levaquin    Nebulizer treatments 3. Mild pulmonary edema    Lasix IV, extra dose today 4. Hyperkalemia     Kayexalate     DVT Prophylaxis SCDs  AM Labs Ordered, also please review Full Orders  Family Communication: Admission, patients condition and plan of care including tests being ordered have been discussed with the patient and sister who indicate understanding and agree with the plan and Code Status.  Code Status full  Disposition Plan: Home  Time spent in minutes : 34 minutes  Condition GUARDED   @SIGNATURE @

## 2013-08-22 ENCOUNTER — Inpatient Hospital Stay (HOSPITAL_COMMUNITY): Payer: BC Managed Care – PPO

## 2013-08-22 ENCOUNTER — Encounter (HOSPITAL_COMMUNITY): Payer: Self-pay | Admitting: *Deleted

## 2013-08-22 DIAGNOSIS — Z22322 Carrier or suspected carrier of Methicillin resistant Staphylococcus aureus: Secondary | ICD-10-CM

## 2013-08-22 DIAGNOSIS — Z932 Ileostomy status: Secondary | ICD-10-CM

## 2013-08-22 DIAGNOSIS — J189 Pneumonia, unspecified organism: Secondary | ICD-10-CM

## 2013-08-22 DIAGNOSIS — E871 Hypo-osmolality and hyponatremia: Secondary | ICD-10-CM

## 2013-08-22 DIAGNOSIS — E872 Acidosis, unspecified: Secondary | ICD-10-CM

## 2013-08-22 DIAGNOSIS — R0600 Dyspnea, unspecified: Secondary | ICD-10-CM | POA: Diagnosis present

## 2013-08-22 DIAGNOSIS — E875 Hyperkalemia: Secondary | ICD-10-CM | POA: Diagnosis present

## 2013-08-22 HISTORY — DX: Carrier or suspected carrier of methicillin resistant Staphylococcus aureus: Z22.322

## 2013-08-22 LAB — BASIC METABOLIC PANEL
BUN: 14 mg/dL (ref 6–23)
CHLORIDE: 94 meq/L — AB (ref 96–112)
CO2: 18 mEq/L — ABNORMAL LOW (ref 19–32)
CREATININE: 0.86 mg/dL (ref 0.50–1.10)
Calcium: 7.1 mg/dL — ABNORMAL LOW (ref 8.4–10.5)
GFR calc Af Amer: 81 mL/min — ABNORMAL LOW (ref >90)
GFR calc non Af Amer: 70 mL/min — ABNORMAL LOW (ref >90)
Glucose, Bld: 89 mg/dL (ref 70–99)
Potassium: 4.4 mEq/L (ref 3.7–5.3)
Sodium: 129 mEq/L — ABNORMAL LOW (ref 137–147)

## 2013-08-22 LAB — LACTIC ACID, PLASMA: LACTIC ACID, VENOUS: 1.3 mmol/L (ref 0.5–2.2)

## 2013-08-22 LAB — CBC
HEMATOCRIT: 22 % — AB (ref 36.0–46.0)
HEMOGLOBIN: 8 g/dL — AB (ref 12.0–15.0)
MCH: 35.9 pg — ABNORMAL HIGH (ref 26.0–34.0)
MCHC: 36.4 g/dL — AB (ref 30.0–36.0)
MCV: 98.7 fL (ref 78.0–100.0)
Platelets: 203 10*3/uL (ref 150–400)
RBC: 2.23 MIL/uL — ABNORMAL LOW (ref 3.87–5.11)
RDW: 19.4 % — ABNORMAL HIGH (ref 11.5–15.5)
WBC: 4.2 10*3/uL (ref 4.0–10.5)

## 2013-08-22 LAB — MRSA PCR SCREENING: MRSA BY PCR: POSITIVE — AB

## 2013-08-22 LAB — PREPARE RBC (CROSSMATCH)

## 2013-08-22 MED ORDER — ESTRADIOL 2 MG PO TABS
2.0000 mg | ORAL_TABLET | Freq: Every day | ORAL | Status: DC
  Administered 2013-08-22 – 2013-08-26 (×5): 2 mg via ORAL
  Filled 2013-08-22 (×6): qty 1

## 2013-08-22 MED ORDER — ALBUTEROL SULFATE (2.5 MG/3ML) 0.083% IN NEBU: 2.5000 mg | INHALATION_SOLUTION | RESPIRATORY_TRACT | Status: DC | PRN

## 2013-08-22 MED ORDER — FUROSEMIDE 10 MG/ML IJ SOLN
40.0000 mg | Freq: Once | INTRAMUSCULAR | Status: AC
  Administered 2013-08-22: 40 mg via INTRAVENOUS
  Filled 2013-08-22: qty 4

## 2013-08-22 MED ORDER — FUROSEMIDE 40 MG PO TABS
40.0000 mg | ORAL_TABLET | ORAL | Status: DC | PRN
  Filled 2013-08-22: qty 1

## 2013-08-22 MED ORDER — ASPIRIN EC 325 MG PO TBEC
325.0000 mg | DELAYED_RELEASE_TABLET | Freq: Every day | ORAL | Status: DC
  Administered 2013-08-22 – 2013-08-26 (×5): 325 mg via ORAL
  Filled 2013-08-22 (×5): qty 1

## 2013-08-22 MED ORDER — VITAMINS A & D EX OINT
TOPICAL_OINTMENT | CUTANEOUS | Status: AC
  Administered 2013-08-22: 23:00:00
  Filled 2013-08-22: qty 5

## 2013-08-22 MED ORDER — BUPROPION HCL ER (XL) 300 MG PO TB24
300.0000 mg | ORAL_TABLET | Freq: Every day | ORAL | Status: DC
  Administered 2013-08-22 – 2013-08-26 (×4): 300 mg via ORAL
  Filled 2013-08-22 (×6): qty 1

## 2013-08-22 MED ORDER — PROMETHAZINE HCL 25 MG/ML IJ SOLN
12.5000 mg | Freq: Once | INTRAMUSCULAR | Status: AC
  Administered 2013-08-22: 12.5 mg via INTRAVENOUS
  Filled 2013-08-22: qty 1

## 2013-08-22 MED ORDER — LEVOFLOXACIN IN D5W 500 MG/100ML IV SOLN
500.0000 mg | INTRAVENOUS | Status: DC
  Administered 2013-08-22 – 2013-08-25 (×4): 500 mg via INTRAVENOUS
  Filled 2013-08-22 (×5): qty 100

## 2013-08-22 MED ORDER — IOHEXOL 300 MG/ML  SOLN: 20.0000 mL | Freq: Once | INTRAMUSCULAR | Status: AC | PRN

## 2013-08-22 MED ORDER — ONDANSETRON HCL 4 MG PO TABS
4.0000 mg | ORAL_TABLET | Freq: Four times a day (QID) | ORAL | Status: DC | PRN
  Administered 2013-08-22 – 2013-08-25 (×4): 4 mg via ORAL
  Filled 2013-08-22 (×4): qty 1

## 2013-08-22 MED ORDER — LOPERAMIDE HCL 2 MG PO CAPS
2.0000 mg | ORAL_CAPSULE | ORAL | Status: DC | PRN
  Administered 2013-08-22 – 2013-08-26 (×10): 2 mg via ORAL
  Filled 2013-08-22 (×9): qty 1

## 2013-08-22 MED ORDER — CHLORHEXIDINE GLUCONATE 0.12 % MT SOLN
15.0000 mL | Freq: Two times a day (BID) | OROMUCOSAL | Status: DC
  Administered 2013-08-22 – 2013-08-26 (×6): 15 mL via OROMUCOSAL
  Filled 2013-08-22 (×11): qty 15

## 2013-08-22 MED ORDER — SODIUM CHLORIDE 0.9 % IJ SOLN: 10.0000 mL | INTRAMUSCULAR | Status: DC | PRN

## 2013-08-22 MED ORDER — PANTOPRAZOLE SODIUM 40 MG PO TBEC
40.0000 mg | DELAYED_RELEASE_TABLET | Freq: Every day | ORAL | Status: DC
  Administered 2013-08-22 – 2013-08-26 (×5): 40 mg via ORAL
  Filled 2013-08-22 (×5): qty 1

## 2013-08-22 MED ORDER — BISOPROLOL FUMARATE 5 MG PO TABS
5.0000 mg | ORAL_TABLET | Freq: Every day | ORAL | Status: DC
  Administered 2013-08-22 – 2013-08-26 (×5): 5 mg via ORAL
  Filled 2013-08-22 (×6): qty 1

## 2013-08-22 MED ORDER — DIPHENOXYLATE-ATROPINE 2.5-0.025 MG PO TABS
1.0000 | ORAL_TABLET | Freq: Every day | ORAL | Status: DC
  Administered 2013-08-22 – 2013-08-25 (×5): 1 via ORAL
  Filled 2013-08-22 (×4): qty 1

## 2013-08-22 MED ORDER — SODIUM POLYSTYRENE SULFONATE 15 GM/60ML PO SUSP
30.0000 g | Freq: Once | ORAL | Status: DC
  Filled 2013-08-22: qty 120

## 2013-08-22 MED ORDER — ALBUTEROL SULFATE (2.5 MG/3ML) 0.083% IN NEBU: 2.5000 mg | INHALATION_SOLUTION | Freq: Four times a day (QID) | RESPIRATORY_TRACT | Status: DC

## 2013-08-22 MED ORDER — IPRATROPIUM-ALBUTEROL 0.5-2.5 (3) MG/3ML IN SOLN
3.0000 mL | Freq: Three times a day (TID) | RESPIRATORY_TRACT | Status: DC
  Administered 2013-08-22 – 2013-08-24 (×4): 3 mL via RESPIRATORY_TRACT
  Filled 2013-08-22 (×7): qty 3

## 2013-08-22 MED ORDER — CHLORHEXIDINE GLUCONATE CLOTH 2 % EX PADS
6.0000 | MEDICATED_PAD | Freq: Every day | CUTANEOUS | Status: DC
  Administered 2013-08-22 – 2013-08-26 (×4): 6 via TOPICAL

## 2013-08-22 MED ORDER — HYDROMORPHONE HCL PF 1 MG/ML IJ SOLN
0.5000 mg | INTRAMUSCULAR | Status: DC | PRN
Start: 2013-08-22 — End: 2013-08-26
  Administered 2013-08-24 – 2013-08-25 (×5): 0.5 mg via INTRAVENOUS
  Filled 2013-08-22 (×5): qty 1

## 2013-08-22 MED ORDER — ONDANSETRON HCL 4 MG/2ML IJ SOLN
4.0000 mg | Freq: Four times a day (QID) | INTRAMUSCULAR | Status: DC | PRN
  Administered 2013-08-22 – 2013-08-25 (×8): 4 mg via INTRAVENOUS
  Filled 2013-08-22 (×8): qty 2

## 2013-08-22 MED ORDER — ACETAMINOPHEN 650 MG RE SUPP: 650.0000 mg | Freq: Four times a day (QID) | RECTAL | Status: DC | PRN

## 2013-08-22 MED ORDER — MUPIROCIN 2 % EX OINT
1.0000 "application " | TOPICAL_OINTMENT | Freq: Two times a day (BID) | CUTANEOUS | Status: DC
  Administered 2013-08-22 – 2013-08-26 (×8): 1 via NASAL
  Filled 2013-08-22: qty 22

## 2013-08-22 MED ORDER — ALBUTEROL SULFATE (2.5 MG/3ML) 0.083% IN NEBU: 2.5000 mg | INHALATION_SOLUTION | Freq: Three times a day (TID) | RESPIRATORY_TRACT | Status: DC

## 2013-08-22 MED ORDER — BIOTENE DRY MOUTH MT LIQD
15.0000 mL | Freq: Two times a day (BID) | OROMUCOSAL | Status: DC
  Administered 2013-08-22 – 2013-08-26 (×7): 15 mL via OROMUCOSAL

## 2013-08-22 MED ORDER — BIOTENE DRY MOUTH MT LIQD
15.0000 mL | Freq: Two times a day (BID) | OROMUCOSAL | Status: DC
  Administered 2013-08-22 – 2013-08-25 (×7): 15 mL via OROMUCOSAL

## 2013-08-22 MED ORDER — DIPHENOXYLATE-ATROPINE 2.5-0.025 MG PO TABS
1.0000 | ORAL_TABLET | Freq: Every evening | ORAL | Status: DC | PRN
  Administered 2013-08-25: 1 via ORAL
  Filled 2013-08-22 (×2): qty 1

## 2013-08-22 MED ORDER — ACETAMINOPHEN 325 MG PO TABS
650.0000 mg | ORAL_TABLET | Freq: Four times a day (QID) | ORAL | Status: DC | PRN
  Administered 2013-08-22: 650 mg via ORAL
  Filled 2013-08-22: qty 2

## 2013-08-22 MED ORDER — CYCLOBENZAPRINE HCL 10 MG PO TABS
10.0000 mg | ORAL_TABLET | Freq: Three times a day (TID) | ORAL | Status: DC | PRN
  Administered 2013-08-26: 10 mg via ORAL
  Filled 2013-08-22: qty 1

## 2013-08-22 MED ORDER — HYDROCODONE-ACETAMINOPHEN 10-325 MG PO TABS
1.0000 | ORAL_TABLET | ORAL | Status: DC | PRN
  Administered 2013-08-22 – 2013-08-26 (×10): 1 via ORAL
  Filled 2013-08-22 (×10): qty 1

## 2013-08-22 MED ORDER — IPRATROPIUM BROMIDE 0.02 % IN SOLN: 0.5000 mg | Freq: Four times a day (QID) | RESPIRATORY_TRACT | Status: DC

## 2013-08-22 MED ORDER — LIDOCAINE HCL 1 % IJ SOLN
INTRAMUSCULAR | Status: AC
  Filled 2013-08-22: qty 20

## 2013-08-22 MED ORDER — CELECOXIB 200 MG PO CAPS
200.0000 mg | ORAL_CAPSULE | Freq: Every day | ORAL | Status: DC
  Administered 2013-08-22 – 2013-08-26 (×5): 200 mg via ORAL
  Filled 2013-08-22 (×5): qty 1

## 2013-08-22 MED ORDER — FUROSEMIDE 10 MG/ML IJ SOLN
20.0000 mg | Freq: Once | INTRAMUSCULAR | Status: AC
  Administered 2013-08-22: 20 mg via INTRAVENOUS
  Filled 2013-08-22 (×2): qty 2

## 2013-08-22 MED ORDER — IPRATROPIUM BROMIDE 0.02 % IN SOLN: 0.5000 mg | Freq: Three times a day (TID) | RESPIRATORY_TRACT | Status: DC

## 2013-08-22 NOTE — Progress Notes (Signed)
Progress Note   [name] XLK:[id number] DOB: [birth year] DOA: 08/21/2013 PCP: Darlin Coco, MD   Brief Narrative:   [name] is an 63 y.o. female with a PMH of recurrent ischemic colitis status post loop ileostomy complicated by chronic abdominal infection with an open abdominal wound, possible chronic myelomonocytic leukemia versus bone marrow dyspoeisis related to B12 deficiency and chronic infection with chronic anemia requiring blood transfusion, who was admitted on 08/21/13 with a chief complaint of a two-week history of increasing dyspnea on exertion and mild cough. Upon initial evaluating [name] in ED, patient was afebrile with an oxygen saturation of 90%, a normal WBC, and a chest x-ray that showed bilateral diffuse interstitial and patchy alveolar airspace opacity is, right greater than left concerning for multilobar pneumonia including atypical etiologies versus pulmonary edema.  Assessment/Plan:   Principal Problem:   Symptomatic anemia  2 units of packed red blood cells will be transfused once an appropriate match is made. Patient has multiple antibodies.  We'll notify Dr. Alvy Bimler of the patient's admission.  Office visit note from 02/27/13 indicates that her anemia may be secondary to B12 deficiency and anemia of chronic disease, rather than CMML. Active Problems:   Chronic open abdominal wound  Wound care nurse consulted. Continue wet to dry dressing changes.   Chronic hyponatremia  Sodium stable.   CMML (chronic myelomonocytic leukemia)  We'll notify Dr. Alvy Bimler of the patient's admission.   Ileostomy status  Continue Lomotil to reduce ostomy output.   B12 deficiency  Seems to be corrected, last B12 level 436 on 06/04/13.   Dyspnea  Likely multifactorial with anemia and pulmonary edema +/- pneumonia contributory.  Will receive 2 units of packed red blood cells. Diuresed. On empiric antibiotics.  Continue albuterol as needed.    Hyperkalemia  Received Kayexalate 30 g x15/7/15.   Metabolic acidosis  Likely from bicarbonate losses secondary to high ostomy output.   MRSA carrier  Continue contact isolation.  Continue Bactroban intranasally and surgical scrubs daily.   Pneumonia, multilobar vs. pulmonary edema  Patient is afebrile and without leukocytosis. She was placed on empiric Levaquin.  Given a dose of IV Lasix 08/21/13 with I/O. balance is -2.8 L. We'll repeat chest x-ray.  His chest x-ray shows clearing, can likely discontinue antibiotics.   DVT Prophylaxis  Continue SCDs.   Code Status: Full. Family Communication: Daughter-in-law at bedside. Disposition Plan: Home when stable.   IV Access:    Peripheral IV   Procedures:    None.   Medical Consultants:    None.   Other Consultants:    None.   Anti-Infectives:    Levaquin 08/21/13--->  Subjective:    [name] feels a bit fatigued, nauseated, not frankly dyspneic while lying in bed.  Objective:    Filed Vitals:   08/22/13 0010 08/22/13 0130 08/22/13 0646 08/22/13 0831  BP: 148/88  134/87   Pulse: 108 102 106   Temp: 98.4 F (36.9 C)  98.7 F (37.1 C)   TempSrc: Oral  Oral   Resp: 16  16   Height: 5\' 5"  (1.651 m)     Weight: 62.007 kg (136 lb 11.2 oz)     SpO2: 91%  92% 94%    Intake/Output Summary (Last 24 hours) at 08/22/13 1357 Last data filed at 08/22/13 1019  Gross per 24 hour  Intake    770 ml  Output   3600 ml  Net  -2830 ml    Exam: Gen:  NAD,  pale Cardiovascular:  Tachycardic, No M/R/G Respiratory:  Lungs CTAB Gastrointestinal:  Abdomen soft, midline abdominal wound proximally 4 to-5 cm with denuded skin and scabs, ostomy right lower quadrant with pink stoma Extremities:  No C/E/C   Data Reviewed:    Labs: Basic Metabolic Panel:  Recent Labs Lab 08/21/13 1833 08/22/13 0840  NA 128* 129*  K 5.7* 4.4  CL 95* 94*  CO2 16* 18*  GLUCOSE 79 89  BUN 13 14  CREATININE 0.81 0.86   CALCIUM 6.8* 7.1*   GFR Estimated Creatinine Clearance: 59.5 ml/min (by C-G formula based on Cr of 0.86).  CBC:  Recent Labs Lab 08/21/13 2218 08/22/13 0840  WBC 4.1 4.2  NEUTROABS 1.7  --   HGB 6.8* 8.0*  HCT 20.0* 22.0*  MCV 94.3 98.7  PLT 179 203   Cardiac Enzymes: No results found for this basename: CKTOTAL, CKMB, CKMBINDEX, TROPONINI,  in the last 168 hours BNP (last 3 results)  Recent Labs  03/24/13 2031 03/25/13 0334 03/26/13 0414  PROBNP 9254.0* 9403.0* 4535.0*   Sepsis Labs:  Recent Labs Lab 08/21/13 2218 08/22/13 0840  WBC 4.1 4.2  LATICACIDVEN  --  1.3   Microbiology Recent Results (from the past 240 hour(s))  MRSA PCR SCREENING     Status: Abnormal   Collection Time    08/22/13  1:25 AM      Result Value Ref Range Status   MRSA by PCR POSITIVE (*) NEGATIVE Final   Comment:            The GeneXpert MRSA Assay (FDA     approved for NASAL specimens     only), is one component of a     comprehensive MRSA colonization     surveillance program. It is not     intended to diagnose MRSA     infection nor to guide or     monitor treatment for     MRSA infections.     RESULT CALLED TO, READ BACK BY AND VERIFIED WITH:     J. DODSON RN AT 0530 ON 05.07.15 BY SHUEA     Radiographs/Studies:   Dg Chest 2 View  08/21/2013   CLINICAL DATA:  Cough, shortness of breath, dizziness  EXAM: CHEST  2 VIEW  COMPARISON:  DG CHEST 1V PORT dated 03/25/2013  FINDINGS: There is bilateral diffuse interstitial and patchy alveolar airspace opacities, right greater than left. There is no pleural effusion or pneumothorax. Stable cardiomediastinal silhouette. Unremarkable osseous structures.  IMPRESSION: Bilateral diffuse interstitial and patchy alveolar airspace opacities, right greater than left concerning for multilobar pneumonia including atypical etiologies versus pulmonary edema.   Electronically Signed   By: Kathreen Devoid   On: 08/21/2013 19:21    Medications:   .  antiseptic oral rinse  15 mL Mouth Rinse BID  . antiseptic oral rinse  15 mL Mouth Rinse q12n4p  . aspirin  325 mg Oral Daily  . bisoprolol  5 mg Oral QAC breakfast  . buPROPion  300 mg Oral QAC breakfast  . celecoxib  200 mg Oral Daily  . chlorhexidine  15 mL Mouth Rinse BID  . Chlorhexidine Gluconate Cloth  6 each Topical Daily  . diphenoxylate-atropine  1 tablet Oral QHS  . estradiol  2 mg Oral QAC breakfast  . furosemide  20 mg Intravenous Once  . ipratropium-albuterol  3 mL Nebulization TID  . levofloxacin (LEVAQUIN) IV  500 mg Intravenous Q24H  . mupirocin ointment  1 application Nasal BID  .  pantoprazole  40 mg Oral Daily  . sodium polystyrene  30 g Oral Once   Continuous Infusions:   Time spent: 35 minutes with > 50% of time discussing current diagnostic test results, clinical impression and plan of care.    LOS: 1 day   [place]  [facility] Pager [phone]. If unable to reach me by pager, please call my cell phone at [phone].  *Please refer to [email or web address], password [email or web address] to get updated schedule on who will round on this patient, as hospitalists switch teams weekly. If 7PM-7AM, please contact night-coverage at [email or web address], password [email or web address] for any overnight needs.  08/22/2013, 1:57 PM    **Disclaimer: This note was dictated with voice recognition software. Similar sounding words can inadvertently be transcribed and this note may contain transcription errors which may not have been corrected upon publication of note.**

## 2013-08-22 NOTE — Procedures (Addendum)
Successful placement of dual lumen PICC line to right brachial vein. Length 36 cm Tip at lower SVC/RA No complications Ready for use. See PACS report for details  [name] [facility] Interventional Radiology 08/22/2013 2:56 PM

## 2013-08-22 NOTE — Progress Notes (Signed)
Patient admitted from ED with anemia and PNA vs pulmonary edema. VSS. Has chronic wound to mid abdomen that she did not want assessed now. Does daily wet to dry dsgs at home. Denies pain. Hx of MRSA so PCR sent. Oriented to room and unit.

## 2013-08-22 NOTE — Progress Notes (Signed)
Peripherally Inserted Central Catheter/Midline Placement  The IV Nurse has discussed with the patient and/or persons authorized to consent for the patient, the purpose of this procedure and the potential benefits and risks involved with this procedure.  The benefits include less needle sticks, lab draws from the catheter and patient may be discharged home with the catheter.  Risks include, but not limited to, infection, bleeding, blood clot (thrombus formation), and puncture of an artery; nerve damage and irregular heat beat.  Alternatives to this procedure were also discussed.  PICC/Midline Placement Documentation        [name] 08/22/2013, 11:31 AM

## 2013-08-22 NOTE — Progress Notes (Signed)
Spoke to NP Schorr about blood. Patient with "lots" of antibodies per blood bank. Difficult crossmatch. "May take a while" per blood bank to get blood. NP going to call blood bank as well.

## 2013-08-22 NOTE — Progress Notes (Signed)
Attempted to insert PICC via right basilic and brachial veins without success.   Unable to thread guidewires.  Called Interventional Radiology and spoke with [facility].   Gave her the needed information for PICC insertion.   Primary RN made aware and Dr. Rockne Menghini in and made aware too.

## 2013-08-22 NOTE — Progress Notes (Signed)
Patient MRSA PCR +. Initiated order set.

## 2013-08-22 NOTE — Progress Notes (Signed)
Patient refused kayexalate. Worried that it would make her chronic diarrhea from her ileostomy worse. Did agree to take the IV lasix in divided doses this morning to "help lower the potassium".

## 2013-08-23 DIAGNOSIS — S31109A Unspecified open wound of abdominal wall, unspecified quadrant without penetration into peritoneal cavity, initial encounter: Secondary | ICD-10-CM

## 2013-08-23 DIAGNOSIS — J9600 Acute respiratory failure, unspecified whether with hypoxia or hypercapnia: Secondary | ICD-10-CM

## 2013-08-23 DIAGNOSIS — Z22322 Carrier or suspected carrier of Methicillin resistant Staphylococcus aureus: Secondary | ICD-10-CM

## 2013-08-23 DIAGNOSIS — I4891 Unspecified atrial fibrillation: Secondary | ICD-10-CM

## 2013-08-23 LAB — TYPE AND SCREEN
ABO/RH(D): A POS
ANTIBODY SCREEN: POSITIVE
DAT, IgG: POSITIVE
DONOR AG TYPE: NEGATIVE
DONOR AG TYPE: NEGATIVE
Unit division: 0
Unit division: 0

## 2013-08-23 LAB — CBC
HEMATOCRIT: 27.1 % — AB (ref 36.0–46.0)
Hemoglobin: 9.5 g/dL — ABNORMAL LOW (ref 12.0–15.0)
MCH: 30.7 pg (ref 26.0–34.0)
MCHC: 35.1 g/dL (ref 30.0–36.0)
MCV: 88.7 fL (ref 78.0–100.0)
PLATELETS: 197 10*3/uL (ref 150–400)
RBC: 3.09 MIL/uL — ABNORMAL LOW (ref 3.87–5.11)
RDW: 20.1 % — AB (ref 11.5–15.5)
WBC: 5.7 10*3/uL (ref 4.0–10.5)

## 2013-08-23 LAB — COMPREHENSIVE METABOLIC PANEL
ALBUMIN: 2.5 g/dL — AB (ref 3.5–5.2)
ALK PHOS: 518 U/L — AB (ref 39–117)
ALT: 10 U/L (ref 0–35)
AST: 15 U/L (ref 0–37)
BUN: 21 mg/dL (ref 6–23)
CO2: 18 mEq/L — ABNORMAL LOW (ref 19–32)
Calcium: 6.8 mg/dL — ABNORMAL LOW (ref 8.4–10.5)
Chloride: 94 mEq/L — ABNORMAL LOW (ref 96–112)
Creatinine, Ser: 1.26 mg/dL — ABNORMAL HIGH (ref 0.50–1.10)
GFR calc Af Amer: 51 mL/min — ABNORMAL LOW (ref >90)
GFR calc non Af Amer: 44 mL/min — ABNORMAL LOW (ref >90)
Glucose, Bld: 95 mg/dL (ref 70–99)
POTASSIUM: 4.1 meq/L (ref 3.7–5.3)
SODIUM: 127 meq/L — AB (ref 137–147)
TOTAL PROTEIN: 7.2 g/dL (ref 6.0–8.3)
Total Bilirubin: 0.7 mg/dL (ref 0.3–1.2)

## 2013-08-23 MED ORDER — PROMETHAZINE HCL 25 MG/ML IJ SOLN
12.5000 mg | Freq: Once | INTRAMUSCULAR | Status: AC
  Administered 2013-08-23: 12.5 mg via INTRAVENOUS
  Filled 2013-08-23: qty 1

## 2013-08-23 NOTE — Progress Notes (Signed)
Progress Note   [name] SWN:[id number] DOB: [birth year] DOA: 08/21/2013 PCP: Darlin Coco, MD   Brief Narrative:   [name] is an 63 y.o. female with a PMH of recurrent ischemic colitis status post loop ileostomy complicated by chronic abdominal infection with an open abdominal wound, possible chronic myelomonocytic leukemia versus bone marrow dyspoeisis related to B12 deficiency and chronic infection with chronic anemia requiring blood transfusion, who was admitted on 08/21/13 with a chief complaint of a two-week history of increasing dyspnea on exertion and mild cough. Upon initial evaluating [name] in ED, patient was afebrile with an oxygen saturation of 90%, a normal WBC, and a chest x-ray that showed bilateral diffuse interstitial and patchy alveolar airspace opacity is, right greater than left concerning for multilobar pneumonia including atypical etiologies versus pulmonary edema.  Assessment/Plan:   Principal Problem:   Symptomatic anemia  2 units of packed red blood cells given overnight with a post transfusion hemoglobin stable at 9.5.   Dr. Alvy Bimler made aware of the patient's admission.  Office visit note from 02/27/13 indicates that her anemia may be secondary to B12 deficiency and anemia of chronic disease, rather than CMML. Active Problems:   Acute kidney injury  Likely from diuresis, monitor.   Chronic open abdominal wound  Wound care nurse consulted. Continue wet to dry dressing changes.   Chronic hyponatremia  Sodium stable.   CMML (chronic myelomonocytic leukemia)  Dr. Alvy Bimler notified of the patient's admission.   Ileostomy status  Continue Lomotil to reduce ostomy output.   B12 deficiency  Seems to be corrected, last B12 level 436 on 06/04/13.   Dyspnea  Likely multifactorial with anemia and pulmonary edema +/- pneumonia contributory.  Will receive 2 units of packed red blood cells. Diuresed. On empiric antibiotics.  Continue albuterol as  needed.   Hyperkalemia  Received Kayexalate 30 g x1 on 08/22/13. Potassium normal now.   Metabolic acidosis  Likely from bicarbonate losses secondary to high ostomy output.   MRSA carrier  Continue contact isolation.  Continue Bactroban intranasally and surgical scrubs daily.   Pneumonia, multilobar vs. pulmonary edema  Patient is afebrile and without leukocytosis, but reports cough over the past few weeks.   Continue empiric Levaquin for now.  Given a dose of IV Lasix 08/21/13 with I/O. balance is - 3 L over the course of hospital stay. Repeat chest x-ray does show partial improvement in asymmetric interstitial and airspace disease.   DVT Prophylaxis  Continue SCDs.   Code Status: Full. Family Communication: Daughter-in-law at bedside 08/22/13. Disposition Plan: Home when stable.   IV Access:    PICC line   Procedures:    Insertion of PICC line 08/22/13   Medical Consultants:    None.   Other Consultants:    None.   Anti-Infectives:    Levaquin 08/21/13--->  Subjective:    [name] feels a bit better, but is still having some problems with nausea. She continues to have a cough, productive of clear sputum. Feels a bit more energetic after having the blood.  Objective:    Filed Vitals:   08/22/13 2100 08/22/13 2143 08/22/13 2230 08/23/13 0548  BP:  124/76 138/85 128/82  Pulse: 102 110 102 97  Temp:  98.2 F (36.8 C) 98.3 F (36.8 C) 98.6 F (37 C)  TempSrc:  Oral Oral Oral  Resp:  18 16 16   Height:      Weight:      SpO2:   96% 94%  Intake/Output Summary (Last 24 hours) at 08/23/13 0807 Last data filed at 08/23/13 0500  Gross per 24 hour  Intake 2310.5 ml  Output   3600 ml  Net -1289.5 ml    Exam: Gen:  NAD, pale Cardiovascular:  Tachycardic, No M/R/G Respiratory:  Lungs CTAB Gastrointestinal:  Abdomen soft, positive bowel sounds  Extremities:  No C/E/C   Data Reviewed:    Labs: Basic Metabolic Panel:  Recent Labs Lab  08/21/13 1833 08/22/13 0840 08/23/13 0445  NA 128* 129* 127*  K 5.7* 4.4 4.1  CL 95* 94* 94*  CO2 16* 18* 18*  GLUCOSE 79 89 95  BUN 13 14 21   CREATININE 0.81 0.86 1.26*  CALCIUM 6.8* 7.1* 6.8*   GFR Estimated Creatinine Clearance: 40.6 ml/min (by C-G formula based on Cr of 1.26).  CBC:  Recent Labs Lab 08/21/13 2218 08/22/13 0840 08/23/13 0445  WBC 4.1 4.2 5.7  NEUTROABS 1.7  --   --   HGB 6.8* 8.0* 9.5*  HCT 20.0* 22.0* 27.1*  MCV 94.3 98.7 88.7  PLT 179 203 197   BNP (last 3 results)  Recent Labs  03/24/13 2031 03/25/13 0334 03/26/13 0414  PROBNP 9254.0* 9403.0* 4535.0*   Sepsis Labs:  Recent Labs Lab 08/21/13 2218 08/22/13 0840 08/23/13 0445  WBC 4.1 4.2 5.7  LATICACIDVEN  --  1.3  --    Microbiology Recent Results (from the past 240 hour(s))  MRSA PCR SCREENING     Status: Abnormal   Collection Time    08/22/13  1:25 AM      Result Value Ref Range Status   MRSA by PCR POSITIVE (*) NEGATIVE Final   Comment:            The GeneXpert MRSA Assay (FDA     approved for NASAL specimens     only), is one component of a     comprehensive MRSA colonization     surveillance program. It is not     intended to diagnose MRSA     infection nor to guide or     monitor treatment for     MRSA infections.     RESULT CALLED TO, READ BACK BY AND VERIFIED WITH:     J. DODSON RN AT 0530 ON 05.07.15 BY SHUEA     Radiographs/Studies:   Dg Chest 2 View  08/21/2013   CLINICAL DATA:  Cough, shortness of breath, dizziness  EXAM: CHEST  2 VIEW  COMPARISON:  DG CHEST 1V PORT dated 03/25/2013  FINDINGS: There is bilateral diffuse interstitial and patchy alveolar airspace opacities, right greater than left. There is no pleural effusion or pneumothorax. Stable cardiomediastinal silhouette. Unremarkable osseous structures.  IMPRESSION: Bilateral diffuse interstitial and patchy alveolar airspace opacities, right greater than left concerning for multilobar pneumonia including  atypical etiologies versus pulmonary edema.   Electronically Signed   By: Kathreen Devoid   On: 08/21/2013 19:21    Medications:   . antiseptic oral rinse  15 mL Mouth Rinse BID  . antiseptic oral rinse  15 mL Mouth Rinse q12n4p  . aspirin  325 mg Oral Daily  . bisoprolol  5 mg Oral QAC breakfast  . buPROPion  300 mg Oral QAC breakfast  . celecoxib  200 mg Oral Daily  . chlorhexidine  15 mL Mouth Rinse BID  . Chlorhexidine Gluconate Cloth  6 each Topical Daily  . diphenoxylate-atropine  1 tablet Oral QHS  . estradiol  2 mg Oral QAC breakfast  .  ipratropium-albuterol  3 mL Nebulization TID  . levofloxacin (LEVAQUIN) IV  500 mg Intravenous Q24H  . mupirocin ointment  1 application Nasal BID  . pantoprazole  40 mg Oral Daily  . sodium polystyrene  30 g Oral Once   Continuous Infusions:   Time spent: 25 minutes.    LOS: 2 days   [place]  [facility] Pager [phone]. If unable to reach me by pager, please call my cell phone at [phone].  *Please refer to [email or web address], password [email or web address] to get updated schedule on who will round on this patient, as hospitalists switch teams weekly. If 7PM-7AM, please contact night-coverage at [email or web address], password [email or web address] for any overnight needs.  08/23/2013, 8:07 AM    **Disclaimer: This note was dictated with voice recognition software. Similar sounding words can inadvertently be transcribed and this note may contain transcription errors which may not have been corrected upon publication of note.**

## 2013-08-23 NOTE — Consult Note (Addendum)
WOC wound consult note Reason for Consult: Chronic open wound to abdomen.  Nonhealing surgical wound.  RLQ Ileostomy Wound type: Chronic, nonhealing surgical wound.  History (+) MRSA Measurement: 10 cm x 13.3 cm x 0.2 cm with scattered scabbed areas noted to perimeter of wound that patient states will bleed heavily if removed.  Wound bed: 100% pale pink. Drainage (amount, consistency, odor) Moderate, thick, green drainage.  History of pseudomonas, per patient.  Musty odor.  Periwound: Scabbing noted to periphery of wound.  Otherwise intact.  Dressing procedure/placement/frequency: Cleanse wound with NS and pat gently dry.  Apply Silver hydrogel impregnated kerlix to wound bed. Top with dry gauze and secure with tape.  Change daily.   WOC ostomy consult note Stoma type/location: RLQ Ileostomy Stomal assessment/size: not assessed this visit.  Pouch was changed last night.  Peristomal assessment: Patient states skin is intact.  Independent with self care.   Treatment options for stomal/peristomal skin: Convex, 1 piece pouching system.  Output Liquid, high output.  Ostomy pouching: 1pc. Pouch 1 1/2 " Kellie Simmering # [phone])  Education provided:  POuches ordered.  Patient independent with self care.   Will not follow at this time.  Please re-consult if needed.  Domenic Moras RN BSN [place] Pager [phone]

## 2013-08-24 ENCOUNTER — Inpatient Hospital Stay (HOSPITAL_COMMUNITY): Payer: BC Managed Care – PPO

## 2013-08-24 DIAGNOSIS — E860 Dehydration: Secondary | ICD-10-CM

## 2013-08-24 DIAGNOSIS — R05 Cough: Secondary | ICD-10-CM

## 2013-08-24 DIAGNOSIS — E538 Deficiency of other specified B group vitamins: Secondary | ICD-10-CM

## 2013-08-24 DIAGNOSIS — R059 Cough, unspecified: Secondary | ICD-10-CM

## 2013-08-24 LAB — BASIC METABOLIC PANEL
BUN: 21 mg/dL (ref 6–23)
CALCIUM: 7.3 mg/dL — AB (ref 8.4–10.5)
CO2: 17 mEq/L — ABNORMAL LOW (ref 19–32)
Chloride: 95 mEq/L — ABNORMAL LOW (ref 96–112)
Creatinine, Ser: 1.22 mg/dL — ABNORMAL HIGH (ref 0.50–1.10)
GFR, EST AFRICAN AMERICAN: 53 mL/min — AB (ref >90)
GFR, EST NON AFRICAN AMERICAN: 46 mL/min — AB (ref >90)
Glucose, Bld: 107 mg/dL — ABNORMAL HIGH (ref 70–99)
POTASSIUM: 4.7 meq/L (ref 3.7–5.3)
SODIUM: 126 meq/L — AB (ref 137–147)

## 2013-08-24 LAB — CBC
HCT: 28.5 % — ABNORMAL LOW (ref 36.0–46.0)
HEMOGLOBIN: 9.8 g/dL — AB (ref 12.0–15.0)
MCH: 30.8 pg (ref 26.0–34.0)
MCHC: 34.4 g/dL (ref 30.0–36.0)
MCV: 89.6 fL (ref 78.0–100.0)
PLATELETS: 185 10*3/uL (ref 150–400)
RBC: 3.18 MIL/uL — AB (ref 3.87–5.11)
RDW: 19.8 % — ABNORMAL HIGH (ref 11.5–15.5)
WBC: 5.3 10*3/uL (ref 4.0–10.5)

## 2013-08-24 MED ORDER — SODIUM CHLORIDE 0.9 % IV SOLN: INTRAVENOUS | Status: DC

## 2013-08-24 MED ORDER — PROMETHAZINE HCL 25 MG/ML IJ SOLN: 12.5000 mg | Freq: Four times a day (QID) | INTRAMUSCULAR | Status: DC | PRN

## 2013-08-24 MED ORDER — PROMETHAZINE HCL 25 MG/ML IJ SOLN
12.5000 mg | Freq: Four times a day (QID) | INTRAMUSCULAR | Status: DC | PRN
  Administered 2013-08-24: 12.5 mg via INTRAVENOUS
  Filled 2013-08-24: qty 1

## 2013-08-24 MED ORDER — METOCLOPRAMIDE HCL 5 MG/ML IJ SOLN
5.0000 mg | Freq: Once | INTRAMUSCULAR | Status: AC
  Administered 2013-08-24: 5 mg via INTRAVENOUS
  Filled 2013-08-24: qty 2

## 2013-08-24 MED ORDER — PROMETHAZINE HCL 25 MG/ML IJ SOLN
12.5000 mg | Freq: Four times a day (QID) | INTRAMUSCULAR | Status: DC | PRN
  Administered 2013-08-24: 12.5 mg via INTRAVENOUS
  Administered 2013-08-24 – 2013-08-25 (×3): 25 mg via INTRAVENOUS
  Filled 2013-08-24 (×4): qty 1

## 2013-08-24 MED ORDER — IPRATROPIUM-ALBUTEROL 0.5-2.5 (3) MG/3ML IN SOLN
3.0000 mL | Freq: Two times a day (BID) | RESPIRATORY_TRACT | Status: DC
  Administered 2013-08-25 – 2013-08-26 (×3): 3 mL via RESPIRATORY_TRACT
  Filled 2013-08-24 (×4): qty 3

## 2013-08-24 NOTE — Progress Notes (Addendum)
Patient still with nausea despite antiemetics. Paged NP on call orders obtained. Ostomy with little output.

## 2013-08-24 NOTE — Progress Notes (Signed)
Patient remains nauseated despite zofran and phenergan. Output from ostomy only 75 ml this whole shift. Paged Kathline Magic NP on call. Orders received for reglan and KUB.

## 2013-08-24 NOTE — Progress Notes (Signed)
Patient with nausea without vomiting after zofran. Paged NP and order obtained for phenergan.

## 2013-08-24 NOTE — Progress Notes (Signed)
After portable xray completed, patient vomited 800 ml of emesis. Felt "better".

## 2013-08-24 NOTE — Progress Notes (Signed)
Patient still with nausea despite zofran and phenergan. Patient's ostomy remains with very little output. + increased distention. Paged NP on call orders obtained.

## 2013-08-24 NOTE — Progress Notes (Signed)
Progress Note   Foster Miyoshi GEX:[id number] DOB: Aug 23, 1950 DOA: 08/21/2013 PCP: Darlin Coco, MD   Brief Narrative:   [name] is an 63 y.o. female with a PMH of recurrent ischemic colitis status post loop ileostomy complicated by chronic abdominal infection with an open abdominal wound, possible chronic myelomonocytic leukemia versus bone marrow dyspoeisis related to B12 deficiency and chronic infection with chronic anemia requiring blood transfusion, who was admitted on 08/21/13 with a chief complaint of a two-week history of increasing dyspnea on exertion and mild cough. Upon initial evaluating [name], patient was afebrile with an oxygen saturation of 90%, a normal WBC, and a chest x-ray that showed bilateral diffuse interstitial and patchy alveolar airspace opacity is, right greater than left concerning for multilobar pneumonia including atypical etiologies versus pulmonary edema.  Assessment/Plan:   Principal Problem:   Symptomatic anemia  2 units of packed red blood cells given overnight with a post transfusion hemoglobin stable at 9.5.   Dr. Alvy Bimler made aware of the patient's admission.  Office visit note from 02/27/13 indicates that her anemia may be secondary to B12 deficiency and anemia of chronic disease, rather than CMML. Active Problems:   Nausea and vomiting  Continue anti-emetics as needed.  Start IV fluids.  Abdominal films negative. Likely a side effect from antibiotics.   Acute kidney injury  Likely from diuresis, monitor.   Chronic open abdominal wound  Wound care nurse consulted. Continue wet to dry dressing changes.   Chronic hyponatremia  Sodium slightly lower today, hydrate with normal saline.   CMML (chronic myelomonocytic leukemia)  Dr. Alvy Bimler notified of the patient's admission.   Ileostomy status  Continue Lomotil to reduce ostomy output.   B12 deficiency  Seems to be corrected, last B12 level 436 on 06/04/13.    Dyspnea  Likely multifactorial with anemia and pulmonary edema +/- pneumonia contributory.  Will receive 2 units of packed red blood cells. Diuresed. On empiric antibiotics.  Continue albuterol as needed.   Hyperkalemia  Received Kayexalate 30 g x1 on 08/22/13. Potassium normal now.   Metabolic acidosis  Likely from bicarbonate losses secondary to high ostomy output.   MRSA carrier  Continue contact isolation.  Continue Bactroban intranasally and surgical scrubs daily.   Pneumonia, multilobar vs. pulmonary edema  Patient is afebrile and without leukocytosis, but reports cough over the past few weeks.   Continue empiric Levaquin for now.  Given a dose of IV Lasix 08/21/13 with I/O. balance is - 3.3 L over the course of hospital stay. Repeat chest x-ray does show partial improvement in asymmetric interstitial and airspace disease.   DVT Prophylaxis  Continue SCDs.   Code Status: Full. Family Communication: Daughter-in-law at bedside 08/22/13. Disposition Plan: Home when stable.   IV Access:    PICC line   Procedures:    Insertion of PICC line 08/22/13   Medical Consultants:    None.   Other Consultants:    None.   Anti-Infectives:    Levaquin 08/21/13--->  Subjective:    [name] feels worse today. She had nausea and vomiting yesterday with very little oral intake. She still feels nauseated this morning. States her wheezing is better, but she still has a cough, still productive of clear sputum.  Objective:    Filed Vitals:   08/23/13 0809 08/23/13 1501 08/23/13 2137 08/24/13 0437  BP:  123/70 126/92 145/104  Pulse:  76 105 99  Temp:  98.1 F (36.7 C) 98.5 F (36.9 C)  97.8 F (36.6 C)  TempSrc:  Oral Oral Oral  Resp:  18 18 20   Height:      Weight:      SpO2: 93% 94% 92% 90%    Intake/Output Summary (Last 24 hours) at 08/24/13 0743 Last data filed at 08/24/13 0617  Gross per 24 hour  Intake    840 ml  Output   1100 ml  Net   -260 ml     Exam: Gen:  NAD Cardiovascular:  Tachycardic, No M/R/G Respiratory:  Lungs CTAB Gastrointestinal:  Abdomen soft, positive bowel sounds  Extremities:  No C/E/C   Data Reviewed:    Labs: Basic Metabolic Panel:  Recent Labs Lab 08/21/13 1833 08/22/13 0840 08/23/13 0445 08/24/13 0420  NA 128* 129* 127* 126*  K 5.7* 4.4 4.1 4.7  CL 95* 94* 94* 95*  CO2 16* 18* 18* 17*  GLUCOSE 79 89 95 107*  BUN 13 14 21 21   CREATININE 0.81 0.86 1.26* 1.22*  CALCIUM 6.8* 7.1* 6.8* 7.3*   GFR Estimated Creatinine Clearance: 41.9 ml/min (by C-G formula based on Cr of 1.22).  CBC:  Recent Labs Lab 08/21/13 2218 08/22/13 0840 08/23/13 0445 08/24/13 0420  WBC 4.1 4.2 5.7 5.3  NEUTROABS 1.7  --   --   --   HGB 6.8* 8.0* 9.5* 9.8*  HCT 20.0* 22.0* 27.1* 28.5*  MCV 94.3 98.7 88.7 89.6  PLT 179 203 197 185   BNP (last 3 results)  Recent Labs  03/24/13 2031 03/25/13 0334 03/26/13 0414  PROBNP 9254.0* 9403.0* 4535.0*   Sepsis Labs:  Recent Labs Lab 08/21/13 2218 08/22/13 0840 08/23/13 0445 08/24/13 0420  WBC 4.1 4.2 5.7 5.3  LATICACIDVEN  --  1.3  --   --    Microbiology Recent Results (from the past 240 hour(s))  MRSA PCR SCREENING     Status: Abnormal   Collection Time    08/22/13  1:25 AM      Result Value Ref Range Status   MRSA by PCR POSITIVE (*) NEGATIVE Final   Comment:            The GeneXpert MRSA Assay (FDA     approved for NASAL specimens     only), is one component of a     comprehensive MRSA colonization     surveillance program. It is not     intended to diagnose MRSA     infection nor to guide or     monitor treatment for     MRSA infections.     RESULT CALLED TO, READ BACK BY AND VERIFIED WITH:     J. DODSON RN AT 0530 ON 05.07.15 BY SHUEA     Radiographs/Studies:   Dg Chest 2 View  08/21/2013   CLINICAL DATA:  Cough, shortness of breath, dizziness  EXAM: CHEST  2 VIEW  COMPARISON:  DG CHEST 1V PORT dated 03/25/2013  FINDINGS: There is  bilateral diffuse interstitial and patchy alveolar airspace opacities, right greater than left. There is no pleural effusion or pneumothorax. Stable cardiomediastinal silhouette. Unremarkable osseous structures.  IMPRESSION: Bilateral diffuse interstitial and patchy alveolar airspace opacities, right greater than left concerning for multilobar pneumonia including atypical etiologies versus pulmonary edema.   Electronically Signed   By: Kathreen Devoid   On: 08/21/2013 19:21    Medications:   . antiseptic oral rinse  15 mL Mouth Rinse BID  . antiseptic oral rinse  15 mL Mouth Rinse q12n4p  . aspirin  325  mg Oral Daily  . bisoprolol  5 mg Oral QAC breakfast  . buPROPion  300 mg Oral QAC breakfast  . celecoxib  200 mg Oral Daily  . chlorhexidine  15 mL Mouth Rinse BID  . Chlorhexidine Gluconate Cloth  6 each Topical Daily  . diphenoxylate-atropine  1 tablet Oral QHS  . estradiol  2 mg Oral QAC breakfast  . ipratropium-albuterol  3 mL Nebulization TID  . levofloxacin (LEVAQUIN) IV  500 mg Intravenous Q24H  . mupirocin ointment  1 application Nasal BID  . pantoprazole  40 mg Oral Daily  . sodium polystyrene  30 g Oral Once   Continuous Infusions:   Time spent: 25 minutes.    LOS: 3 days   [place]  [facility] Pager [phone]. If unable to reach me by pager, please call my cell phone at [phone].  *Please refer to [email or web address], password [email or web address] to get updated schedule on who will round on this patient, as hospitalists switch teams weekly. If 7PM-7AM, please contact night-coverage at [email or web address], password [email or web address] for any overnight needs.  08/24/2013, 7:43 AM    **Disclaimer: This note was dictated with voice recognition software. Similar sounding words can inadvertently be transcribed and this note may contain transcription errors which may not have been corrected upon publication of note.**  Information printed out and reviewed with the patient/family:     In an effort to  keep you and your family informed about your hospital stay, I am providing you with this information sheet. If you or your family have any questions, please do not hesitate to have the nursing staff page me to set up a meeting time.  Also note that the hospitalist doctors typically change on Tuesdays or Wednesdays to a different hospitalist doctor.  Jarah Plascencia 08/24/2013 3 (Number of days in the hospital)  Treatment team:  Dr. Jacquelynn Cree, Hospitalist (Internist)   Active Treatment Issues with Plan: Principal Problem:   Anemia  2 units of packed red blood cells given 08/23/13 with a post transfusion hemoglobin of 9.5, 9.8 today.   Followup with Dr. Alvy Bimler as an outpatient for ongoing evaluation of the cause of your anemia. Active Problems:   Chronic open abdominal wound  Continue wet to dry dressing changes. Seen by wound care nurse 08/23/13.   Chronic low serum sodium  Sodium 126 today.   Possible CMML (chronic myelomonocytic leukemia)  Dr. Alvy Bimler notified of the patient's admission. She will see you in followup as an outpatient.   Ileostomy status  Continue Lomotil to reduce ostomy output.   B12 deficiency  Seems to be corrected, last B12 level 436 on 06/04/13.   Shortness of breath  Likely multifactorial with anemia and pulmonary edema +/- pneumonia contributory.  Given 2 units of packed red blood cells to address anemia. Diuresed with Lasix to address pulmonary edema. On empiric antibiotics to address pneumonia.  Continue albuterol as needed.  Repeat chest x-ray showed improvement in airspace disease.   High serum potassium  Received Kayexalate 30 g x1 on 08/22/13. Potassium normal now.   Low serum bicarbonate  Likely from bicarbonate losses secondary to high ostomy output.   MRSA carrier  Continue contact isolation.  Continue Bactroban intranasally and surgical scrubs daily.  Anticipated discharge date: Depends on when you feel better.

## 2013-08-25 DIAGNOSIS — R0609 Other forms of dyspnea: Secondary | ICD-10-CM

## 2013-08-25 DIAGNOSIS — R0989 Other specified symptoms and signs involving the circulatory and respiratory systems: Secondary | ICD-10-CM

## 2013-08-25 LAB — CBC
HCT: 26.3 % — ABNORMAL LOW (ref 36.0–46.0)
Hemoglobin: 9.1 g/dL — ABNORMAL LOW (ref 12.0–15.0)
MCH: 31.3 pg (ref 26.0–34.0)
MCHC: 34.6 g/dL (ref 30.0–36.0)
MCV: 90.4 fL (ref 78.0–100.0)
PLATELETS: 182 10*3/uL (ref 150–400)
RBC: 2.91 MIL/uL — AB (ref 3.87–5.11)
RDW: 19.2 % — ABNORMAL HIGH (ref 11.5–15.5)
WBC: 8.1 10*3/uL (ref 4.0–10.5)

## 2013-08-25 LAB — BASIC METABOLIC PANEL
BUN: 23 mg/dL (ref 6–23)
CALCIUM: 6.8 mg/dL — AB (ref 8.4–10.5)
CO2: 16 meq/L — AB (ref 19–32)
Chloride: 97 mEq/L (ref 96–112)
Creatinine, Ser: 0.95 mg/dL (ref 0.50–1.10)
GFR calc Af Amer: 72 mL/min — ABNORMAL LOW (ref >90)
GFR, EST NON AFRICAN AMERICAN: 62 mL/min — AB (ref >90)
GLUCOSE: 80 mg/dL (ref 70–99)
POTASSIUM: 4.6 meq/L (ref 3.7–5.3)
Sodium: 129 mEq/L — ABNORMAL LOW (ref 137–147)

## 2013-08-25 MED ORDER — GUAIFENESIN ER 600 MG PO TB12: 600.0000 mg | ORAL_TABLET | Freq: Two times a day (BID) | ORAL | Status: DC

## 2013-08-25 MED ORDER — SODIUM BICARBONATE 650 MG PO TABS
650.0000 mg | ORAL_TABLET | Freq: Three times a day (TID) | ORAL | Status: DC
  Administered 2013-08-25 – 2013-08-26 (×4): 650 mg via ORAL
  Filled 2013-08-25 (×6): qty 1

## 2013-08-25 MED ORDER — LEVOFLOXACIN 750 MG PO TABS: 750.0000 mg | ORAL_TABLET | Freq: Every day | ORAL | Status: DC

## 2013-08-25 MED ORDER — GUAIFENESIN ER 600 MG PO TB12
600.0000 mg | ORAL_TABLET | Freq: Two times a day (BID) | ORAL | Status: DC
  Administered 2013-08-25 – 2013-08-26 (×3): 600 mg via ORAL
  Filled 2013-08-25 (×4): qty 1

## 2013-08-25 MED ORDER — SODIUM BICARBONATE 650 MG PO TABS: 650.0000 mg | ORAL_TABLET | Freq: Three times a day (TID) | ORAL | Status: DC

## 2013-08-25 NOTE — Progress Notes (Addendum)
Progress Note   [name] OHY:[id number] DOB: 12/14/1950 DOA: 08/21/2013 PCP: Darlin Coco, MD   Brief Narrative:   [name] is an 63 y.o. female with a PMH of recurrent ischemic colitis status post loop ileostomy complicated by chronic abdominal infection with an open abdominal wound, possible chronic myelomonocytic leukemia versus bone marrow dyspoeisis related to B12 deficiency and chronic infection with chronic anemia requiring blood transfusion, who was admitted on 08/21/13 with a chief complaint of a two-week history of increasing dyspnea on exertion and mild cough. Upon initial evaluating [name] in ED, patient was afebrile with an oxygen saturation of 90%, a normal WBC, and a chest x-ray that showed bilateral diffuse interstitial and patchy alveolar airspace opacity is, right greater than left concerning for multilobar pneumonia including atypical etiologies versus pulmonary edema.  Assessment/Plan:   Principal Problem:   Symptomatic anemia  2 units of packed red blood cells given 08/22/13 with a post transfusion hemoglobin of 9.8, hemoglobin remains stable at 9.1.   Dr. Alvy Bimler made aware of the patient's admission.  Office visit note from 02/27/13 indicates that her anemia may be secondary to B12 deficiency and anemia of chronic disease, rather than CMML. Active Problems:   Metabolic acidosis  Likely from bicarb losses in high output ileostomy.  Start on bicarb supplement.   Nausea and vomiting  Continue anti-emetics as needed. Hydrating.  Abdominal films negative. Likely a side effect from antibiotics.   Acute kidney injury  Likely from diuresis, monitor.  Creatinine improved.   Chronic open abdominal wound  Wound care nurse consulted. Continue wet to dry dressing changes.   Chronic hyponatremia   Sodium slightly improved today, hydrating with normal saline.   CMML (chronic myelomonocytic leukemia)  Dr. Alvy Bimler notified of the patient's admission.  Ileostomy status  Continue Lomotil to reduce ostomy output.   B12 deficiency  Seems to be corrected, last B12 level 436 on 06/04/13.   Dyspnea  Likely multifactorial with anemia and pulmonary edema +/- pneumonia contributory.  Received 2 units of packed red blood cells. Diuresed. On empiric antibiotics.  Continue albuterol as needed.   Hyperkalemia  Received Kayexalate 30 g x1 on 08/22/13. Potassium normal now.   MRSA carrier  Continue contact isolation.  Continue Bactroban intranasally and surgical scrubs daily.   Pneumonia, multilobar vs. pulmonary edema  Patient is afebrile and without leukocytosis, but reports cough over the past few weeks.   Continue empiric Levaquin for now.  Given a dose of IV Lasix 08/21/13 with I/O. balance is - 4.5 L over the course of hospital stay. Repeat chest x-ray does show partial improvement in asymmetric interstitial and airspace disease.   DVT Prophylaxis  Continue SCDs.   Code Status: Full. Family Communication: Daughter-in-law at bedside 08/22/13. Disposition Plan: Home when stable.   IV Access:    PICC line   Procedures:    Insertion of PICC line 08/22/13   Medical Consultants:    None.   Other Consultants:    None.   Anti-Infectives:    Levaquin 08/21/13--->  Subjective:   [name] feels better today. Still has some nausea, but no vomiting since yesterday a.m. Ate a few bites of breakfast this morning.   No wheezing, but still has a cough.  Objective:    Filed Vitals:   08/24/13 0715 08/24/13 1506 08/24/13 2055 08/25/13 0527  BP:  144/89 143/99 114/69  Pulse: 104 107 60 94  Temp:  97.7 F (36.5 C) 98 F (36.7 C) 98.2  F (36.8 C)  TempSrc:  Oral Oral Oral  Resp:  18 20 20   Height:      Weight:      SpO2: 94% 96% 90% 94%    Intake/Output Summary (Last 24 hours) at 08/25/13 0757 Last data filed at 08/25/13 0528  Gross per 24 hour  Intake    460 ml  Output   1775 ml  Net  -1315 ml     Exam: Gen:  NAD Cardiovascular:  RRR, No M/R/G Respiratory:  Lungs CTAB Gastrointestinal:  Abdomen soft, positive bowel sounds  Extremities:  No C/E/C   Data Reviewed:    Labs: Basic Metabolic Panel:  Recent Labs Lab 08/21/13 1833 08/22/13 0840 08/23/13 0445 08/24/13 0420 08/25/13 0510  NA 128* 129* 127* 126* 129*  K 5.7* 4.4 4.1 4.7 4.6  CL 95* 94* 94* 95* 97  CO2 16* 18* 18* 17* 16*  GLUCOSE 79 89 95 107* 80  BUN 13 14 21 21 23   CREATININE 0.81 0.86 1.26* 1.22* 0.95  CALCIUM 6.8* 7.1* 6.8* 7.3* 6.8*   GFR Estimated Creatinine Clearance: 53.8 ml/min (by C-G formula based on Cr of 0.95).  CBC:  Recent Labs Lab 08/21/13 2218 08/22/13 0840 08/23/13 0445 08/24/13 0420 08/25/13 0510  WBC 4.1 4.2 5.7 5.3 8.1  NEUTROABS 1.7  --   --   --   --   HGB 6.8* 8.0* 9.5* 9.8* 9.1*  HCT 20.0* 22.0* 27.1* 28.5* 26.3*  MCV 94.3 98.7 88.7 89.6 90.4  PLT 179 203 197 185 182   BNP (last 3 results)  Recent Labs  03/24/13 2031 03/25/13 0334 03/26/13 0414  PROBNP 9254.0* 9403.0* 4535.0*   Sepsis Labs:  Recent Labs Lab 08/22/13 0840 08/23/13 0445 08/24/13 0420 08/25/13 0510  WBC 4.2 5.7 5.3 8.1  LATICACIDVEN 1.3  --   --   --    Microbiology Recent Results (from the past 240 hour(s))  MRSA PCR SCREENING     Status: Abnormal   Collection Time    08/22/13  1:25 AM      Result Value Ref Range Status   MRSA by PCR POSITIVE (*) NEGATIVE Final   Comment:            The GeneXpert MRSA Assay (FDA     approved for NASAL specimens     only), is one component of a     comprehensive MRSA colonization     surveillance program. It is not     intended to diagnose MRSA     infection nor to guide or     monitor treatment for     MRSA infections.     RESULT CALLED TO, READ BACK BY AND VERIFIED WITH:     J. DODSON RN AT 0530 ON 05.07.15 BY SHUEA     Radiographs/Studies:   Dg Chest 2 View  08/21/2013   CLINICAL DATA:  Cough, shortness of breath, dizziness  EXAM:  CHEST  2 VIEW  COMPARISON:  DG CHEST 1V PORT dated 03/25/2013  FINDINGS: There is bilateral diffuse interstitial and patchy alveolar airspace opacities, right greater than left. There is no pleural effusion or pneumothorax. Stable cardiomediastinal silhouette. Unremarkable osseous structures.  IMPRESSION: Bilateral diffuse interstitial and patchy alveolar airspace opacities, right greater than left concerning for multilobar pneumonia including atypical etiologies versus pulmonary edema.   Electronically Signed   By: Kathreen Devoid   On: 08/21/2013 19:21    Medications:   . antiseptic oral rinse  15 mL Mouth  Rinse BID  . antiseptic oral rinse  15 mL Mouth Rinse q12n4p  . aspirin  325 mg Oral Daily  . bisoprolol  5 mg Oral QAC breakfast  . buPROPion  300 mg Oral QAC breakfast  . celecoxib  200 mg Oral Daily  . chlorhexidine  15 mL Mouth Rinse BID  . Chlorhexidine Gluconate Cloth  6 each Topical Daily  . diphenoxylate-atropine  1 tablet Oral QHS  . estradiol  2 mg Oral QAC breakfast  . ipratropium-albuterol  3 mL Nebulization BID  . levofloxacin (LEVAQUIN) IV  500 mg Intravenous Q24H  . mupirocin ointment  1 application Nasal BID  . pantoprazole  40 mg Oral Daily  . sodium polystyrene  30 g Oral Once   Continuous Infusions: . sodium chloride      Time spent: 25 minutes.    LOS: 4 days   [place]  [facility] Pager [phone]. If unable to reach me by pager, please call my cell phone at [phone].  *Please refer to [email or web address], password [email or web address] to get updated schedule on who will round on this patient, as hospitalists switch teams weekly. If 7PM-7AM, please contact night-coverage at [email or web address], password [email or web address] for any overnight needs.  08/25/2013, 7:57 AM    **Disclaimer: This note was dictated with voice recognition software. Similar sounding words can inadvertently be transcribed and this note may contain transcription errors which may not have been corrected upon  publication of note.**

## 2013-08-25 NOTE — Discharge Summary (Addendum)
Physician Discharge Summary  [name] [id number] DOB: 1950-06-12 DOA: 08/21/2013  PCP: Darlin Coco, MD  Admit date: 08/21/2013 Discharge date: 08/26/2013   Recommendations for Outpatient Follow-Up:   1. F/U with Dr. Alvy Bimler 08/28/13 for a repeat of CBC, ongoing evaluation of anemia. 2. Note, a PICC line was placed and she will be discharged with this to expedite blood draws from appointment 08/28/13. If not needed for further transfusion, please discontinue PICC line.   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 pulmonary edema   Discharge Condition: Improved.  Diet recommendation:  Regular.   History of Present Illness:   [name] is an 63 y.o. female with a PMH of recurrent ischemic colitis status post loop ileostomy complicated by chronic abdominal infection with an open abdominal wound, possible chronic myelomonocytic leukemia versus bone marrow dyspoeisis related to B12 deficiency and chronic infection with chronic anemia requiring blood transfusion, who was admitted on 08/21/13 with a chief complaint of a two-week history of increasing dyspnea on exertion and mild cough. Upon initial evaluating [name], patient was afebrile with an oxygen saturation of 90%, a normal WBC, and a chest x-ray that showed bilateral diffuse interstitial and patchy alveolar airspace opacity is, right greater than left concerning for multilobar pneumonia including atypical etiologies versus pulmonary edema.  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 Last hemoglobin check 9.1.   Dr. Alvy Bimler made aware of the patient's admission.   Office visit note from 02/27/13 indicates that her anemia may be secondary to B12 deficiency and anemia of chronic disease, rather than CMML. Active Problems:  Metabolic acidosis   Likely from bicarb losses in high output ileostomy.   Will discharge on bicarb supplement. Nausea and vomiting   Continue anti-emetics as needed. Hydrated with improvement.   Abdominal films checked and were negative. Likely a side effect from antibiotics. Acute kidney injury   Likely from diuresis. Creatinine improved with rehydration. Chronic non-healing open abdominal surgical wound   Wound care nurse consulted. Continue wet to dry dressing changes. Chronic hyponatremia   Sodium improved with rehydration. CMML (chronic myelomonocytic leukemia)   Dr. Alvy Bimler notified of the patient's admission. Ileostomy status   Continue Lomotil to reduce ostomy output. B12 deficiency   Seems to be corrected, last B12 level 436 on 06/04/13. Dyspnea   Likely multifactorial with anemia and pulmonary edema +/- pneumonia contributory.   Received 2 units of packed red blood cells. Diuresed. On empiric antibiotics.   Continue albuterol as needed. Hyperkalemia   Received Kayexalate 30 g x1 on 08/22/13. Potassium normal now. MRSA carrier   Maintained on contact isolation and prescribed eradication therapy while in hospital.  Pneumonia, multilobar vs. pulmonary edema   Patient was afebrile and without leukocytosis, but reported cough over the past few weeks.   Treated with empiric Levaquin, which will be switched to by mouth therapy for a total treatment course of 7 days.   Given a dose of  IV Lasix 08/21/13 with I/O. balance was negative over the course of hospital stay. Repeat chest x-ray done 08/22/13 showed partial improvement in asymmetric interstitial and airspace  disease.  Procedures:    Insertion of PICC line 08/22/13  Medical Consultants:    None.   Discharge Exam:   Filed Vitals:   08/26/13 0615  BP: 136/76  Pulse: 104  Temp: 98.2 F (36.8 C)  Resp:    Filed Vitals:   08/25/13 0527 08/25/13 1340 08/25/13 2018 08/26/13 0615  BP: 114/69 114/66 117/76 136/76  Pulse: 94 79 100 104  Temp: 98.2 F (36.8 C) 98.3 F (36.8 C) 98.7 F (37.1 C) 98.2 F (36.8 C)  TempSrc: Oral Oral Oral Oral  Resp: 20 16    Height:      Weight:      SpO2: 94% 98%      Gen:  NAD Cardiovascular:  RRR, No M/R/G Respiratory: Lungs CTAB Gastrointestinal: Abdomen soft, NT/ND with normal active bowel sounds. Extremities: No C/E/C    Discharge Instructions:       Discharge Orders   Future Appointments Provider Department Dept Phone   08/28/2013 12:00 PM [facility] [phone]   08/28/2013 12:30 PM Heath Lark, MD [facility] [phone]   09/19/2013 2:00 PM Odis Hollingshead, MD [facility] [place] [phone]   Future Orders Complete By Expires   Call MD for:  difficulty breathing, headache or visual disturbances  As directed    Call MD for:  extreme fatigue  As directed    Call MD for:  persistant nausea and vomiting  As directed    Call MD for:  temperature >100.4  As directed    Diet general  As directed    Discharge instructions  As directed    Increase activity slowly  As directed        Medication List         albuterol 108 (90 BASE) MCG/ACT inhaler  Commonly known as:  PROVENTIL HFA;VENTOLIN HFA  Inhale 1-2 puffs into the lungs every 6 (six) hours as needed for wheezing.     aspirin 325 MG EC tablet  Take 325 mg by mouth daily.     bisoprolol 5 MG tablet  Commonly known as:  ZEBETA  Take 1 tablet (5 mg total) by mouth daily before breakfast.     buPROPion 300 MG 24 hr tablet  Commonly known as:  WELLBUTRIN XL  Take 300 mg by mouth  daily before breakfast.     celecoxib 200 MG capsule  Commonly known as:  CELEBREX  Take 200 mg by mouth daily.     cyclobenzaprine 10 MG tablet  Commonly known as:  FLEXERIL  Take 10 mg by mouth 3 (three) times daily as needed for muscle spasms.     diphenoxylate-atropine 2.5-0.025 MG per tablet  Commonly known as:  LOMOTIL  Take 1 tablet by mouth at bedtime.     esomeprazole 40 MG capsule  Commonly known as:  NEXIUM  Take 40 mg by mouth every morning.     estradiol 2 MG tablet  Commonly known as:  ESTRACE  Take 2 mg by mouth daily before breakfast.     furosemide 40 MG tablet  Commonly known as:  LASIX  Take 40 mg by mouth as needed for fluid or edema.     guaiFENesin 600 MG 12 hr tablet  Commonly known as:  MUCINEX  Take  1 tablet (600 mg total) by mouth 2 (two) times daily.     HYDROcodone-acetaminophen 10-325 MG per tablet  Commonly known as:  NORCO  Take 1 tablet by mouth every 4 (four) hours as needed for moderate pain.     levofloxacin 750 MG tablet  Commonly known as:  LEVAQUIN  Take 1 tablet (750 mg total) by mouth daily.     loperamide 2 MG capsule  Commonly known as:  IMODIUM  Take 2 mg by mouth 4 (four) times daily as needed for diarrhea or loose stools.     loratadine-pseudoephedrine 5-120 MG per tablet  Commonly known as:  CLARITIN-D 12-hour  Take 1 tablet by mouth daily as needed for allergies.     ondansetron 4 MG tablet  Commonly known as:  ZOFRAN  Take 1 tablet (4 mg total) by mouth every 8 (eight) hours as needed for nausea or vomiting.     sodium bicarbonate 650 MG tablet  Take 1 tablet (650 mg total) by mouth 3 (three) times daily.       Follow-up Information   Schedule an appointment as soon as possible for a visit with Darlin Coco, MD. (If symptoms worsen)    Specialty:  Cardiology   Contact information:   [address] [place] [address] [phone]       Follow up with [facility], NI, MD. (At your appt time  noted below.)    Specialty:  Hematology and Oncology   Contact information:   [place] [id number] [phone]        The results of significant diagnostics from this hospitalization (including imaging, microbiology, ancillary and laboratory) are listed below for reference.     Significant Diagnostic Studies:   Radiographs: Dg Chest 1 View  08/22/2013   CLINICAL DATA:  F/U multifocal PNA vs edema after 2.8 L diuresis  EXAM: CHEST - 1 VIEW  COMPARISON:  the previous day's study  FINDINGS: Right arm PICC extends to the mid SVC. Mild perihilar and bibasilar interstitial edema, decreased since previous exam. Patchy right mid lung and bibasilar airspace opacities persist. . No effusion. Visualized skeletal structures are unremarkable.  IMPRESSION: 1. PICC line to SVC. 2. Partial improvement in asymmetric interstitial and airspace disease.   Electronically Signed   By: Arne Cleveland M.D.   On: 08/22/2013 15:06   Dg Chest 2 View  08/21/2013   CLINICAL DATA:  Cough, shortness of breath, dizziness  EXAM: CHEST  2 VIEW  COMPARISON:  DG CHEST 1V PORT dated 03/25/2013  FINDINGS: There is bilateral diffuse interstitial and patchy alveolar airspace opacities, right greater than left. There is no pleural effusion or pneumothorax. Stable cardiomediastinal silhouette. Unremarkable osseous structures.  IMPRESSION: Bilateral diffuse interstitial and patchy alveolar airspace opacities, right greater than left concerning for multilobar pneumonia including atypical etiologies versus pulmonary edema.   Electronically Signed   By: Kathreen Devoid   On: 08/21/2013 19:21   Dg Abd 1 View  08/24/2013   CLINICAL DATA:  Vomiting and decreased ostomy output. Rule out obstruction.  EXAM: ABDOMEN - 1 VIEW  COMPARISON:  03/24/2013  FINDINGS: Assuming a right abdominal ostomy is a colostomy in this patient with right hemicolectomy, there is likely no bowel obstruction. There is a single loop of prominent bowel  right and midline, measuring nearly 7 cm, that is likely colonic. No definite definitive dilated small bowel.  No suspicious intra-abdominal calcification or mass effect.  Severe lumbar degenerative disc disease  with levoscoliosis. Total left hip arthroplasty. No acute osseous findings.  IMPRESSION: Non obstructive bowel gas pattern.   Electronically Signed   By: Jorje Guild M.D.   On: 08/24/2013 07:26   Ir Fluoro Guide Cv Line Right  08/22/2013   CLINICAL DATA:  Poor venous access.  Request PICC line placement.  EXAM: IR RIGHT FLOURO GUIDE CV LINE; IR ULTRASOUND GUIDANCE VASC ACCESS RIGHT  FLUOROSCOPY TIME:  4 min, 12 seconds .  TECHNIQUE: The right arm was prepped with chlorhexidine, draped in the usual sterile fashion using maximum barrier technique (cap and mask, sterile gown, sterile gloves, large sterile sheet, hand hygiene and cutaneous antiseptic). Local anesthesia was attained by infiltration with 1% lidocaine.  Ultrasound demonstrated patency of the brachial vein, and this was documented with an image. Under real-time ultrasound guidance, this vein was accessed with a 21 gauge micropuncture needle and image documentation was performed. The needle was exchanged over a guidewire for a peel-away sheath. There was difficulty in getting the wire to pass into the SVC due to what appeared to be a tight angle. A brief venogram was performed which demonstrated this tight angle at the junction of the innominate vein to the SVC, but it was widely patent. A Kumpe catheter was inserted over the wire and maneuvered past the angle allowing the guidewire to be passed into the lower SVC. A measurement was taken and then a 36 cm five [place] dual lumen power injectable PICC was advanced over the wire, and positioned with its tip at the lower SVC/right atrial junction. Fluoroscopy during the procedure and fluoro spot radiograph confirms appropriate catheter position. The catheter was flushed, secured to the skin with  Prolene sutures, and covered with a sterile dressing.  COMPLICATIONS: None.  The patient tolerated the procedure well.  IMPRESSION: Successful placement of a right arm PICC with sonographic and fluoroscopic guidance. The catheter is ready for use.  Read by Ascencion Dike PA-C   Electronically Signed   By: Arne Cleveland M.D.   On: 08/22/2013 15:28   Ir US Guide Vasc Access Right  08/22/2013   CLINICAL DATA:  Poor venous access.  Request PICC line placement.  EXAM: IR RIGHT FLOURO GUIDE CV LINE; IR ULTRASOUND GUIDANCE VASC ACCESS RIGHT  FLUOROSCOPY TIME:  4 min, 12 seconds .  TECHNIQUE: The right arm was prepped with chlorhexidine, draped in the usual sterile fashion using maximum barrier technique (cap and mask, sterile gown, sterile gloves, large sterile sheet, hand hygiene and cutaneous antiseptic). Local anesthesia was attained by infiltration with 1% lidocaine.  Ultrasound demonstrated patency of the brachial vein, and this was documented with an image. Under real-time ultrasound guidance, this vein was accessed with a 21 gauge micropuncture needle and image documentation was performed. The needle was exchanged over a guidewire for a peel-away sheath. There was difficulty in getting the wire to pass into the SVC due to what appeared to be a tight angle. A brief venogram was performed which demonstrated this tight angle at the junction of the innominate vein to the SVC, but it was widely patent. A Kumpe catheter was inserted over the wire and maneuvered past the angle allowing the guidewire to be passed into the lower SVC. A measurement was taken and then a 36 cm five [place] dual lumen power injectable PICC was advanced over the wire, and positioned with its tip at the lower SVC/right atrial junction. Fluoroscopy during the procedure and fluoro spot radiograph confirms appropriate catheter position. The catheter was  flushed, secured to the skin with Prolene sutures, and covered with a sterile dressing.   COMPLICATIONS: None.  The patient tolerated the procedure well.  IMPRESSION: Successful placement of a right arm PICC with sonographic and fluoroscopic guidance. The catheter is ready for use.  Read by Ascencion Dike PA-C   Electronically Signed   By: Arne Cleveland M.D.   On: 08/22/2013 15:28    Labs:  Basic Metabolic Panel:  Recent Labs Lab 08/22/13 0840 08/23/13 0445 08/24/13 0420 08/25/13 0510 08/26/13 0540  NA 129* 127* 126* 129* 130*  K 4.4 4.1 4.7 4.6 4.2  CL 94* 94* 95* 97 101  CO2 18* 18* 17* 16* 16*  GLUCOSE 89 95 107* 80 76  BUN 14 21 21 23 18   CREATININE 0.86 1.26* 1.22* 0.95 0.96  CALCIUM 7.1* 6.8* 7.3* 6.8* 6.4*   GFR Estimated Creatinine Clearance: 53.3 ml/min (by C-G formula based on Cr of 0.96). Liver Function Tests:  Recent Labs Lab 08/23/13 0445  AST 15  ALT 10  ALKPHOS 518*  BILITOT 0.7  PROT 7.2  ALBUMIN 2.5*   CBC:  Recent Labs Lab 08/21/13 2218 08/22/13 0840 08/23/13 0445 08/24/13 0420 08/25/13 0510  WBC 4.1 4.2 5.7 5.3 8.1  NEUTROABS 1.7  --   --   --   --   HGB 6.8* 8.0* 9.5* 9.8* 9.1*  HCT 20.0* 22.0* 27.1* 28.5* 26.3*  MCV 94.3 98.7 88.7 89.6 90.4  PLT 179 203 197 185 182   Microbiology Recent Results (from the past 240 hour(s))  MRSA PCR SCREENING     Status: Abnormal   Collection Time    08/22/13  1:25 AM      Result Value Ref Range Status   MRSA by PCR POSITIVE (*) NEGATIVE Final   Comment:            The GeneXpert MRSA Assay (FDA     approved for NASAL specimens     only), is one component of a     comprehensive MRSA colonization     surveillance program. It is not     intended to diagnose MRSA     infection nor to guide or     monitor treatment for     MRSA infections.     RESULT CALLED TO, READ BACK BY AND VERIFIED WITH:     J. DODSON RN AT 0530 ON 05.07.15 BY SHUEA    Time coordinating discharge: 35 minutes.  SignedVenetia Maxon Inman Fettig  Pager [phone] [facility] 08/26/2013, 10:56  AM

## 2013-08-25 NOTE — Discharge Instructions (Signed)
Anemia, Nonspecific Anemia is a condition in which the concentration of red blood cells or hemoglobin in the blood is below normal. Hemoglobin is a substance in red blood cells that carries oxygen to the tissues of the body. Anemia results in not enough oxygen reaching these tissues.  CAUSES  Common causes of anemia include:   Excessive bleeding. Bleeding may be internal or external. This includes excessive bleeding from periods (in women) or from the intestine.   Poor nutrition.   Chronic kidney, thyroid, and liver disease.  Bone marrow disorders that decrease red blood cell production.  Cancer and treatments for cancer.  HIV, AIDS, and their treatments.  Spleen problems that increase red blood cell destruction.  Blood disorders.  Excess destruction of red blood cells due to infection, medicines, and autoimmune disorders. SIGNS AND SYMPTOMS   Minor weakness.   Dizziness.   Headache.  Palpitations.   Shortness of breath, especially with exercise.   Paleness.  Cold sensitivity.  Indigestion.  Nausea.  Difficulty sleeping.  Difficulty concentrating. Symptoms may occur suddenly or they may develop slowly.  DIAGNOSIS  Additional blood tests are often needed. These help your health care provider determine the best treatment. Your health care provider will check your stool for blood and look for other causes of blood loss.  TREATMENT  Treatment varies depending on the cause of the anemia. Treatment can include:   Supplements of iron, vitamin B12, or folic acid.   Hormone medicines.   A blood transfusion. This may be needed if blood loss is severe.   Hospitalization. This may be needed if there is significant continual blood loss.   Dietary changes.  Spleen removal. HOME CARE INSTRUCTIONS Keep all follow-up appointments. It often takes many weeks to correct anemia, and having your health care provider check on your condition and your response to  treatment is very important. SEEK IMMEDIATE MEDICAL CARE IF:   You develop extreme weakness, shortness of breath, or chest pain.   You become dizzy or have trouble concentrating.  You develop heavy vaginal bleeding.   You develop a rash.   You have bloody or black, tarry stools.   You faint.   You vomit up blood.   You vomit repeatedly.   You have abdominal pain.  You have a fever or persistent symptoms for more than 2 3 days.   You have a fever and your symptoms suddenly get worse.   You are dehydrated.  MAKE SURE YOU:  Understand these instructions.  Will watch your condition.  Will get help right away if you are not doing well or get worse. Document Released: 05/12/2004 Document Revised: 12/05/2012 Document Reviewed: 09/28/2012 ExitCare Patient Information 2014 [employer]. Pneumonia, Adult Pneumonia is an infection of the lungs.  CAUSES Pneumonia may be caused by bacteria or a virus. Usually, these infections are caused by breathing infectious particles into the lungs (respiratory tract). SYMPTOMS   Cough.  Fever.  Chest pain.  Increased rate of breathing.  Wheezing.  Mucus production. DIAGNOSIS  If you have the common symptoms of pneumonia, your caregiver will typically confirm the diagnosis with a chest X-ray. The X-ray will show an abnormality in the lung (pulmonary infiltrate) if you have pneumonia. Other tests of your blood, urine, or sputum may be done to find the specific cause of your pneumonia. Your caregiver may also do tests (blood gases or pulse oximetry) to see how well your lungs are working. TREATMENT  Some forms of pneumonia may be spread to   other people when you cough or sneeze. You may be asked to wear a mask before and during your exam. Pneumonia that is caused by bacteria is treated with antibiotic medicine. Pneumonia that is caused by the influenza virus may be treated with an antiviral medicine. Most other viral infections  must run their course. These infections will not respond to antibiotics.  PREVENTION A pneumococcal shot (vaccine) is available to prevent a common bacterial cause of pneumonia. This is usually suggested for:  People over 65 years old.  Patients on chemotherapy.  People with chronic lung problems, such as bronchitis or emphysema.  People with immune system problems. If you are over 65 or have a high risk condition, you may receive the pneumococcal vaccine if you have not received it before. In some countries, a routine influenza vaccine is also recommended. This vaccine can help prevent some cases of pneumonia.You may be offered the influenza vaccine as part of your care. If you smoke, it is time to quit. You may receive instructions on how to stop smoking. Your caregiver can provide medicines and counseling to help you quit. HOME CARE INSTRUCTIONS   Cough suppressants may be used if you are losing too much rest. However, coughing protects you by clearing your lungs. You should avoid using cough suppressants if you can.  Your caregiver may have prescribed medicine if he or she thinks your pneumonia is caused by a bacteria or influenza. Finish your medicine even if you start to feel better.  Your caregiver may also prescribe an expectorant. This loosens the mucus to be coughed up.  Only take over-the-counter or prescription medicines for pain, discomfort, or fever as directed by your caregiver.  Do not smoke. Smoking is a common cause of bronchitis and can contribute to pneumonia. If you are a smoker and continue to smoke, your cough may last several weeks after your pneumonia has cleared.  A cold steam vaporizer or humidifier in your room or home may help loosen mucus.  Coughing is often worse at night. Sleeping in a semi-upright position in a recliner or using a couple pillows under your head will help with this.  Get rest as you feel it is needed. Your body will usually let you know  when you need to rest. SEEK IMMEDIATE MEDICAL CARE IF:   Your illness becomes worse. This is especially true if you are elderly or weakened from any other disease.  You cannot control your cough with suppressants and are losing sleep.  You begin coughing up blood.  You develop pain which is getting worse or is uncontrolled with medicines.  You have a fever.  Any of the symptoms which initially brought you in for treatment are getting worse rather than better.  You develop shortness of breath or chest pain. MAKE SURE YOU:   Understand these instructions.  Will watch your condition.  Will get help right away if you are not doing well or get worse. Document Released: 04/04/2005 Document Revised: 06/27/2011 Document Reviewed: 06/24/2010 Exit[employer] Patient Information 2014 [employer].  

## 2013-08-26 DIAGNOSIS — D638 Anemia in other chronic diseases classified elsewhere: Principal | ICD-10-CM

## 2013-08-26 DIAGNOSIS — R197 Diarrhea, unspecified: Secondary | ICD-10-CM

## 2013-08-26 LAB — BASIC METABOLIC PANEL
BUN: 18 mg/dL (ref 6–23)
CHLORIDE: 101 meq/L (ref 96–112)
CO2: 16 mEq/L — ABNORMAL LOW (ref 19–32)
Calcium: 6.4 mg/dL — CL (ref 8.4–10.5)
Creatinine, Ser: 0.96 mg/dL (ref 0.50–1.10)
GFR calc Af Amer: 71 mL/min — ABNORMAL LOW (ref >90)
GFR calc non Af Amer: 61 mL/min — ABNORMAL LOW (ref >90)
GLUCOSE: 76 mg/dL (ref 70–99)
Potassium: 4.2 mEq/L (ref 3.7–5.3)
SODIUM: 130 meq/L — AB (ref 137–147)

## 2013-08-26 MED ORDER — SODIUM CHLORIDE 0.9 % IV SOLN
1.0000 g | Freq: Once | INTRAVENOUS | Status: AC
  Administered 2013-08-26: 1 g via INTRAVENOUS
  Filled 2013-08-26: qty 10

## 2013-08-26 MED ORDER — HEPARIN SOD (PORK) LOCK FLUSH 100 UNIT/ML IV SOLN
500.0000 [IU] | Freq: Once | INTRAVENOUS | Status: AC
  Administered 2013-08-26: 500 [IU] via INTRAVENOUS
  Filled 2013-08-26: qty 5

## 2013-08-26 NOTE — Progress Notes (Signed)
CRITICAL VALUE ALERT  Critical value received: Calcium 6.4   Date of notification:  08/26/13  Time of notification:  0736  Critical value read back:yes  Nurse who received alert:  Jena Gauss  MD notified (1st page):  Dr. Rockne Menghini  Time of first page:  [phone]  MD notified (2nd page):  Time of second page:  Responding MD:  Dr. Rockne Menghini   Time MD responded:  [phone]

## 2013-08-27 ENCOUNTER — Other Ambulatory Visit: Payer: Self-pay | Admitting: Hematology and Oncology

## 2013-08-27 DIAGNOSIS — C9310 Chronic myelomonocytic leukemia not having achieved remission: Secondary | ICD-10-CM

## 2013-08-27 DIAGNOSIS — D638 Anemia in other chronic diseases classified elsewhere: Secondary | ICD-10-CM

## 2013-08-28 ENCOUNTER — Telehealth: Payer: Self-pay | Admitting: Hematology and Oncology

## 2013-08-28 ENCOUNTER — Ambulatory Visit (HOSPITAL_BASED_OUTPATIENT_CLINIC_OR_DEPARTMENT_OTHER): Payer: BC Managed Care – PPO | Admitting: Hematology and Oncology

## 2013-08-28 ENCOUNTER — Encounter: Payer: Self-pay | Admitting: Hematology and Oncology

## 2013-08-28 ENCOUNTER — Ambulatory Visit: Payer: BC Managed Care – PPO

## 2013-08-28 ENCOUNTER — Ambulatory Visit (HOSPITAL_BASED_OUTPATIENT_CLINIC_OR_DEPARTMENT_OTHER): Payer: BC Managed Care – PPO

## 2013-08-28 VITALS — BP 115/71 | HR 91 | Temp 97.4°F | Resp 17 | Ht 65.0 in | Wt 138.0 lb

## 2013-08-28 DIAGNOSIS — Z452 Encounter for adjustment and management of vascular access device: Secondary | ICD-10-CM

## 2013-08-28 DIAGNOSIS — D638 Anemia in other chronic diseases classified elsewhere: Secondary | ICD-10-CM

## 2013-08-28 DIAGNOSIS — D72821 Monocytosis (symptomatic): Secondary | ICD-10-CM

## 2013-08-28 DIAGNOSIS — C9310 Chronic myelomonocytic leukemia not having achieved remission: Secondary | ICD-10-CM

## 2013-08-28 DIAGNOSIS — C9210 Chronic myeloid leukemia, BCR/ABL-positive, not having achieved remission: Secondary | ICD-10-CM

## 2013-08-28 DIAGNOSIS — F17200 Nicotine dependence, unspecified, uncomplicated: Secondary | ICD-10-CM

## 2013-08-28 DIAGNOSIS — E538 Deficiency of other specified B group vitamins: Secondary | ICD-10-CM

## 2013-08-28 DIAGNOSIS — C9200 Acute myeloblastic leukemia, not having achieved remission: Secondary | ICD-10-CM

## 2013-08-28 LAB — COMPREHENSIVE METABOLIC PANEL (CC13)
ALK PHOS: 495 U/L — AB (ref 40–150)
ALT: 14 U/L (ref 0–55)
AST: 19 U/L (ref 5–34)
Albumin: 2.4 g/dL — ABNORMAL LOW (ref 3.5–5.0)
Anion Gap: 9 mEq/L (ref 3–11)
BILIRUBIN TOTAL: 0.37 mg/dL (ref 0.20–1.20)
BUN: 8.8 mg/dL (ref 7.0–26.0)
CO2: 16 mEq/L — ABNORMAL LOW (ref 22–29)
Calcium: 6.7 mg/dL — ABNORMAL LOW (ref 8.4–10.4)
Chloride: 108 mEq/L (ref 98–109)
Creatinine: 0.8 mg/dL (ref 0.6–1.1)
Glucose: 79 mg/dl (ref 70–140)
Potassium: 3.7 mEq/L (ref 3.5–5.1)
Sodium: 133 mEq/L — ABNORMAL LOW (ref 136–145)
Total Protein: 6.9 g/dL (ref 6.4–8.3)

## 2013-08-28 LAB — CBC & DIFF AND RETIC
BASO%: 0.2 % (ref 0.0–2.0)
Basophils Absolute: 0 10*3/uL (ref 0.0–0.1)
EOS ABS: 0.1 10*3/uL (ref 0.0–0.5)
EOS%: 1.7 % (ref 0.0–7.0)
HCT: 26.6 % — ABNORMAL LOW (ref 34.8–46.6)
HGB: 8.8 g/dL — ABNORMAL LOW (ref 11.6–15.9)
IMMATURE RETIC FRACT: 4.8 % (ref 1.60–10.00)
LYMPH#: 1.4 10*3/uL (ref 0.9–3.3)
LYMPH%: 29.5 % (ref 14.0–49.7)
MCH: 30.2 pg (ref 25.1–34.0)
MCHC: 33.1 g/dL (ref 31.5–36.0)
MCV: 91.4 fL (ref 79.5–101.0)
MONO#: 1.4 10*3/uL — AB (ref 0.1–0.9)
MONO%: 30 % — ABNORMAL HIGH (ref 0.0–14.0)
NEUT%: 38.6 % (ref 38.4–76.8)
NEUTROS ABS: 1.8 10*3/uL (ref 1.5–6.5)
Platelets: 176 10*3/uL (ref 145–400)
RBC: 2.91 10*6/uL — AB (ref 3.70–5.45)
RDW: 19 % — AB (ref 11.2–14.5)
RETIC %: 0.94 % (ref 0.70–2.10)
RETIC CT ABS: 27.35 10*3/uL — AB (ref 33.70–90.70)
WBC: 4.7 10*3/uL (ref 3.9–10.3)

## 2013-08-28 LAB — IRON AND TIBC CHCC
%SAT: 34 % (ref 21–57)
IRON: 85 ug/dL (ref 41–142)
TIBC: 246 ug/dL (ref 236–444)
UIBC: 162 ug/dL (ref 120–384)

## 2013-08-28 LAB — FERRITIN CHCC: Ferritin: 288 ng/ml — ABNORMAL HIGH (ref 9–269)

## 2013-08-28 LAB — MORPHOLOGY: PLT EST: ADEQUATE

## 2013-08-28 MED ORDER — HEPARIN SOD (PORK) LOCK FLUSH 100 UNIT/ML IV SOLN
500.0000 [IU] | Freq: Once | INTRAVENOUS | Status: AC
  Administered 2013-08-28: 250 [IU] via INTRAVENOUS
  Filled 2013-08-28: qty 5

## 2013-08-28 MED ORDER — SODIUM CHLORIDE 0.9 % IJ SOLN
10.0000 mL | INTRAMUSCULAR | Status: DC | PRN
  Administered 2013-08-28: 10 mL via INTRAVENOUS
  Filled 2013-08-28: qty 10

## 2013-08-28 NOTE — Progress Notes (Signed)
[facility] OFFICE PROGRESS NOTE  Patient Care Team: Darlin Coco, MD as PCP - General (Cardiology) Winfield Cunas., MD (Gastroenterology) Garald Balding, MD (Orthopedic Surgery) Odis Hollingshead, MD as Attending Physician (General Surgery) Heath Lark, MD as Consulting Physician (Hematology and Oncology)  DIAGNOSIS: Chronic anemia with monocytosis, B12 deficiency, CMML  SUMMARY OF ONCOLOGIC HISTORY: The patient had recurrent ischemic colitis in 2013. She has significant surgery, sepsis, recurrent hospitalization including intensive care, recurrent blood transfusion, finally was referred to see a hematologist for evaluation. She had extensive workup and subsequently was found to have B12 deficiency along with the diagnosis of CMML. She had a bone marrow aspirate and biopsy which was not grossly abnormal but suggestive of possible diagnosis of CMML due to mild dyspoiesis with mildly elevated monocyte count. She was being observed. Since she was last seen here, she had repeated admission to the hospital for recurrent infection.  INTERVAL HISTORY: [name] 63 y.o. female returns for followup.  She continue to have a chronic nonhealing wound with oozing. According to the patient, the healing is low but is improving. She was recommended to take oral B12 supplements but she has not taken that for while. She continued to have high output diarrhea as she needs to change her stoma bag 6-7 times a day. She was recently transfused in the hospital and is discharged with oral antibiotics for pneumonia. She continued to have productive cough. A PICC line was placed due to poor venous access. The patient continued to smoke. She is attempting to quit. She is currently smoking about 3 cigarettes per day. The patient denies any recent signs or symptoms of bleeding such as spontaneous epistaxis, hematuria or hematochezia. She denies any symptoms of anemia. Denies any chest pain or  shortness of breath. She does complain of fatigue. I have reviewed the past medical history, past surgical history, social history and family history with the patient and they are unchanged from previous note.  ALLERGIES:  is allergic to vancomycin; ativan; codeine; tetanus toxoids; penicillins; and xarelto.  MEDICATIONS:  Current Outpatient Prescriptions  Medication Sig Dispense Refill  . albuterol (PROVENTIL HFA;VENTOLIN HFA) 108 (90 BASE) MCG/ACT inhaler Inhale 1-2 puffs into the lungs every 6 (six) hours as needed for wheezing.      Marland Kitchen aspirin 325 MG EC tablet Take 325 mg by mouth daily.      . bisoprolol (ZEBETA) 5 MG tablet Take 1 tablet (5 mg total) by mouth daily before breakfast.  90 tablet  3  . buPROPion (WELLBUTRIN XL) 300 MG 24 hr tablet Take 300 mg by mouth daily before breakfast.       . celecoxib (CELEBREX) 200 MG capsule Take 200 mg by mouth daily.      . cyclobenzaprine (FLEXERIL) 10 MG tablet Take 10 mg by mouth 3 (three) times daily as needed for muscle spasms.       . diphenoxylate-atropine (LOMOTIL) 2.5-0.025 MG per tablet Take 1 tablet by mouth at bedtime.       Marland Kitchen esomeprazole (NEXIUM) 40 MG capsule Take 40 mg by mouth every morning.       Marland Kitchen estradiol (ESTRACE) 2 MG tablet Take 2 mg by mouth daily before breakfast.       . guaiFENesin (MUCINEX) 600 MG 12 hr tablet Take 1 tablet (600 mg total) by mouth 2 (two) times daily.      Marland Kitchen HYDROcodone-acetaminophen (NORCO) 10-325 MG per tablet Take 1 tablet by mouth every 4 (four)  hours as needed for moderate pain.       Marland Kitchen levofloxacin (LEVAQUIN) 750 MG tablet Take 1 tablet (750 mg total) by mouth daily.  5 tablet  0  . loperamide (IMODIUM) 2 MG capsule Take 2 mg by mouth 4 (four) times daily as needed for diarrhea or loose stools.      Marland Kitchen loratadine-pseudoephedrine (CLARITIN-D 12-HOUR) 5-120 MG per tablet Take 1 tablet by mouth daily as needed for allergies.       Marland Kitchen ondansetron (ZOFRAN) 4 MG tablet Take 1 tablet (4 mg total) by mouth  every 8 (eight) hours as needed for nausea or vomiting.  30 tablet  3  . sodium bicarbonate 650 MG tablet Take 1 tablet (650 mg total) by mouth 3 (three) times daily.  90 tablet  3  . furosemide (LASIX) 40 MG tablet Take 40 mg by mouth as needed for fluid or edema.        No current facility-administered medications for this visit.   Facility-Administered Medications Ordered in Other Visits  Medication Dose Route Frequency Provider Last Rate Last Dose  . sodium chloride 0.9 % injection 10 mL  10 mL Intravenous PRN Heath Lark, MD   10 mL at 08/28/13 1219    REVIEW OF SYSTEMS:   Constitutional: Denies fevers, chills or abnormal weight loss Eyes: Denies blurriness of vision Ears, nose, mouth, throat, and face: Denies mucositis or sore throat Cardiovascular: Denies palpitation, chest discomfort or lower extremity swelling Skin: Denies abnormal skin rashes Lymphatics: Denies new lymphadenopathy  Neurological:Denies numbness, tingling or new weaknesses Behavioral/Psych: Mood is stable, no new changes  All other systems were reviewed with the patient and are negative.  PHYSICAL EXAMINATION: ECOG PERFORMANCE STATUS: 1 - Symptomatic but completely ambulatory  Filed Vitals:   08/28/13 1231  BP: 115/71  Pulse: 91  Temp: 97.4 F (36.3 C)  Resp: 17   Filed Weights   08/28/13 1231  Weight: 138 lb (62.596 kg)    GENERAL:alert, no distress and comfortable. She looks pale SKIN: Significant skin bruising is noted. No petechiae rash EYES: normal, Conjunctiva are pink and non-injected, sclera clear OROPHARYNX:no exudate, no erythema and lips, buccal mucosa, and tongue normal  NECK: supple, thyroid normal size, non-tender, without nodularity LYMPH:  no palpable lymphadenopathy in the cervical, axillary or inguinal LUNGS: clear to auscultation and percussion with normal breathing effort HEART: regular rate & rhythm and no murmurs and no lower extremity edema ABDOMEN:abdomen soft, non-tender  and normal bowel sounds. Stoma looks okay Musculoskeletal:no cyanosis of digits and no clubbing  NEURO: alert & oriented x 3 with fluent speech, no focal motor/sensory deficits  LABORATORY DATA:  I have reviewed the data as listed    Component Value Date/Time   NA 133* 08/28/2013 1211   NA 130* 08/26/2013 0540   K 3.7 08/28/2013 1211   K 4.2 08/26/2013 0540   CL 101 08/26/2013 0540   CL 101 02/09/2012 1321   CO2 16* 08/28/2013 1211   CO2 16* 08/26/2013 0540   GLUCOSE 79 08/28/2013 1211   GLUCOSE 76 08/26/2013 0540   GLUCOSE 79 02/09/2012 1321   BUN 8.8 08/28/2013 1211   BUN 18 08/26/2013 0540   CREATININE 0.8 08/28/2013 1211   CREATININE 0.96 08/26/2013 0540   CREATININE 0.92 08/10/2012 1528   CALCIUM 6.7* 08/28/2013 1211   CALCIUM 6.4* 08/26/2013 0540   PROT 6.9 08/28/2013 1211   PROT 7.2 08/23/2013 0445   ALBUMIN 2.4* 08/28/2013 1211   ALBUMIN 2.5* 08/23/2013 0445  AST 19 08/28/2013 1211   AST 15 08/23/2013 0445   ALT 14 08/28/2013 1211   ALT 10 08/23/2013 0445   ALKPHOS 495* 08/28/2013 1211   ALKPHOS 518* 08/23/2013 0445   BILITOT 0.37 08/28/2013 1211   BILITOT 0.7 08/23/2013 0445   GFRNONAA 61* 08/26/2013 0540   GFRAA 71* 08/26/2013 0540    No results found for this basename: SPEP,  UPEP,   kappa and lambda light chains    Lab Results  Component Value Date   WBC 4.7 08/28/2013   NEUTROABS 1.8 08/28/2013   HGB 8.8* 08/28/2013   HCT 26.6* 08/28/2013   MCV 91.4 08/28/2013   PLT 176 08/28/2013      Chemistry      Component Value Date/Time   NA 133* 08/28/2013 1211   NA 130* 08/26/2013 0540   K 3.7 08/28/2013 1211   K 4.2 08/26/2013 0540   CL 101 08/26/2013 0540   CL 101 02/09/2012 1321   CO2 16* 08/28/2013 1211   CO2 16* 08/26/2013 0540   BUN 8.8 08/28/2013 1211   BUN 18 08/26/2013 0540   CREATININE 0.8 08/28/2013 1211   CREATININE 0.96 08/26/2013 0540   CREATININE 0.92 08/10/2012 1528      Component Value Date/Time   CALCIUM 6.7* 08/28/2013 1211   CALCIUM 6.4* 08/26/2013 0540   ALKPHOS 495*  08/28/2013 1211   ALKPHOS 518* 08/23/2013 0445   AST 19 08/28/2013 1211   AST 15 08/23/2013 0445   ALT 14 08/28/2013 1211   ALT 10 08/23/2013 0445   BILITOT 0.37 08/28/2013 1211   BILITOT 0.7 08/23/2013 0445      ASSESSMENT & PLAN:  #1 CMML I'm skeptical about the diagnosis. The bone marrow biopsy was done in this setting when she was unwell. Sometime monocytosis is seen in the bone marrow in the setting of chronic infection. The mild dyspoiesis could be related to her history of B12 deficiency. I think we will have to revisit this diagnosis in the near future. #2 chronic anemia This is anemia chronic disease. She extensive surgery, infection, and chronic oozing from her wound. We will continue aggressive supportive care and transfuse as needed. We discussed the risks and benefits of transfusion. I would establish a transfusion threshold to 8 g. I discussed with her the risk and benefit of erythropoietin stimulating agents. The patient declined. #3 history of B12 deficiency Recheck on the B12 level is pending. I recommend she continue oral high-dose B12 supplements for now.  #4 portal venous access She has a PICC line now. We discussed about the risk and benefit of keeping the PICC line. The patient have recurrent sepsis related to PICC line. She wants that removed today and I will go ahead and order it to be removed. #5 Poor wound healing We had a long discussion about this. Her chronic smoking  contributes it to her poor wound healing. #6 chronic smoking  The patient is motivated to quit smoking on her own  Orders Placed This Encounter  Procedures  . CBC & Diff and Retic    Standing Status: Standing     Number of Occurrences: 9     Standing Expiration Date: 08/29/2014  . Hold Tube, Blood Bank    Standing Status: Standing     Number of Occurrences: 9     Standing Expiration Date: 08/29/2014  . PICC line removal    Standing Status: Standing     Number of Occurrences: 1     Standing  Expiration Date:    All questions were answered. The patient knows to call the clinic with any problems, questions or concerns. No barriers to learning was detected.    Heath Lark, MD 08/28/2013 2:13 PM

## 2013-08-28 NOTE — Patient Instructions (Signed)
Peripherally Inserted Central Catheter (PICC) Removal and Care After A peripherally inserted catheter (PICC) is removed when it is no longer needed, when it is clotted, or when it may be infected.  PROCEDURE  The removal of a PICC is usually painless. Removing the tape that holds the PICC in place may be the most discomfort you have.  A physicians order needs to be obtained to have the PICC removed.  A PICC can be removed in the hospital or in an outpatient setting.  Never remove or take out the PICC yourself. Only a trained clinical professional, such as a PICC nurse, should remove the PICC.  If a PICC is suspected to be infected, the PICC tip is sent to the lab for culture. HOME CARE INSTRUCTIONS  When the PICC is out, pressure is applied at the insertion site to prevent bleeding. An antibiotic ointment may be applied to the insertion site. A dry, sterile gauze is then taped over the insertion site. This dressing should stay on for 24 hours.  After the 24 hours is up, the dressing may be removed. The PICC insertion site is very small. A small scab may develop over the insertion site. It is okay to wash the site gently with soap and water. Be careful to not remove or pick the scab off. After washing, gently pat the site dry. You do not need to put another dressing over the insertion site after you wash it.  Avoid heavy, strenuous physical activity for 24 hours after the PICC is removed. This includes things like:  Weight lifting.  Strenuous yard work.  Any physical activity with repetitive arm movement. SEEK MEDICAL CARE IF:  Call or see your caregiver as soon as possible if you develop the following conditions in the arm in which the PICC was inserted:  Swelling or puffiness.  Increasing tenderness or pain. SEEK IMMEDIATE MEDICAL CARE IF:  You develop any of the following conditions in the arm that had the PICC:  Numbness or tingling in your fingers, hand, or arm.  You arm has  a bluish color and it is cold to the touch.  Redness around the insertion site or a red-streak that goes up your arm.  Any type of drainage from the PICC insertion site. This includes drainage such as:  Bleeding from the insertion site. (If this happens, apply firm, direct pressure to the PICC insertion site with a clean towel.)  Drainage that is yellow or tan in color.  You have an oral temperature above 102 F (38.9 C), not controlled by medicine. Document Released: 09/22/2009 Document Revised: 06/27/2011 Document Reviewed: 09/22/2009 [employer] Patient Information 2014 [employer].  

## 2013-08-28 NOTE — Progress Notes (Signed)
Pt with no complaints post PICC removal. VSS. Instructions given re: keep dressing dry and intact for 24 hours. May remove dressing after 24 hours. Call if bleeding through dressing occurs. Pt repeated instructions, and verbalized understanding.

## 2013-08-28 NOTE — Patient Instructions (Signed)
PICC Home Guide A peripherally inserted central catheter (PICC) is a long, thin, flexible tube that is inserted into a vein in the upper arm. It is a form of intravenous (IV) access. It is considered to be a "central" line because the tip of the PICC ends in a large vein in your chest. This large vein is called the superior vena cava (SVC). The PICC tip ends in the SVC because there is a lot of blood flow in the SVC. This allows medicines and IV fluids to be quickly distributed throughout the body. The PICC is inserted using a sterile technique by a specially trained nurse or physician. After the PICC is inserted, a chest X-ray exam is done to be sure it is in the correct place.  A PICC may be placed for different reasons, such as:  To give medicines and liquid nutrition that can only be given through a central line. Examples are:  Certain antibiotic treatments.  Chemotherapy.  Total parenteral nutrition (TPN).  To take frequent blood samples.  To give IV fluids and blood products.  If there is difficulty placing a peripheral intravenous (PIV) catheter. If taken care of properly, a PICC can remain in place for several months. A PICC can also allow a person to go home from the hospital early. Medicine and PICC care can be managed at home by a family member or home health care team. WHAT PROBLEMS CAN HAPPEN WHEN I HAVE A PICC? Problems with a PICC can occasionally occur. These may include:  A blood clot (thrombus) forming in or at the tip of the PICC. This can cause the PICC to become clogged. A clot-dissolving medicine called tissue plasminogen activator (tPA) can be given through the PICC to help break up the clot.  Inflammation of the vein (phlebitis) in which the PICC is placed. Signs of inflammation may include redness, pain at the insertion site, red streaks, or being able to feel a "cord" in the vein where the PICC is located.  Infection in the PICC or at the insertion site. Signs of  infection may include fever, chills, redness, swelling, or pus drainage from the PICC insertion site.  PICC movement (malposition). The PICC tip may move from its original position due to excessive physical activity, forceful coughing, sneezing, or vomiting.  A break or cut in the PICC. It is important to not use scissors near the PICC.  Nerve or tendon irritation or injury during PICC insertion. WHAT SHOULD I KEEP IN MIND ABOUT ACTIVITIES WHEN I HAVE A PICC?  You may bend your arm and move it freely. If your PICC is near or at the bend of your elbow, avoid activity with repeated motion at the elbow.  Rest at home for the remainder of the day following PICC line insertion.  Avoid lifting heavy objects as instructed by your health care provider.  Avoid using a crutch with the arm on the same side as your PICC. You may need to use a walker. WHAT SHOULD I KNOW ABOUT MY PICC DRESSING?  Keep your PICC bandage (dressing) clean and dry to prevent infection.  Ask your health care provider when you may shower. Ask your health care provider to teach you how to wrap the PICC when you do take a shower.  Change the PICC dressing as instructed by your health care provider.  Change your PICC dressing if it becomes loose or wet. WHAT SHOULD I KNOW ABOUT PICC CARE?  Check the PICC insertion site daily for   leakage, redness, swelling, or pain.  Do not take a bath, swim, or use hot tubs when you have a PICC. Cover PICC line with clear plastic wrap and tape to keep it dry while showering.  Flush the PICC as directed by your health care provider. Let your health care provider know right away if the PICC is difficult to flush or does not flush. Do not use force to flush the PICC.  Do not use a syringe that is less than 10 mL to flush the PICC.  Never pull or tug on the PICC.  Avoid blood pressure checks on the arm with the PICC.  Keep your PICC identification card with you at all times.  Do not  take the PICC out yourself. Only a trained clinical professional should remove the PICC. SEEK IMMEDIATE MEDICAL CARE IF:  Your PICC is accidently pulled all the way out. If this happens, cover the insertion site with a bandage or gauze dressing. Do not throw the PICC away. Your health care provider will need to inspect it.  Your PICC was tugged or pulled and has partially come out. Do not  push the PICC back in.  There is any type of drainage, redness, or swelling where the PICC enters the skin.  You cannot flush the PICC, it is difficult to flush, or the PICC leaks around the insertion site when it is flushed.  You hear a "flushing" sound when the PICC is flushed.  You have pain, discomfort, or numbness in your arm, shoulder, or jaw on the same side as the PICC.  You feel your heart "racing" or skipping beats.  You notice a hole or tear in the PICC.  You develop chills or a fever. MAKE SURE YOU:   Understand these instructions.  Will watch your condition.  Will get help right away if you are not doing well or get worse. Document Released: 10/09/2002 Document Revised: 01/23/2013 Document Reviewed: 12/10/2012 [employer] Patient Information 2014 [employer].  

## 2013-08-28 NOTE — Telephone Encounter (Signed)
gv pt sister appt schedule for may/june. pt to have wkly lab * 4 but will be out of town wk of 5/18. NG informed and per NG get pt in for next lab asap after return. per sister pt will be gone the entire wk. 5/25 is a holiday and next appt is 5/26. wkly lab for 5/27 moved to 5/26. sister aware and has schedule.

## 2013-08-28 NOTE — Progress Notes (Signed)
Pt here for PICC removal.  Right PICC removed without difficulty; tip at 36 cm.  Petroleum gauze applied with 4x4 and medipore tape dressing; pt tolerated well without any problems.  Pt laying flat for 30 min post PICC removal.

## 2013-08-30 LAB — VITAMIN B12: Vitamin B-12: 403 pg/mL (ref 211–911)

## 2013-08-30 LAB — ERYTHROPOIETIN: Erythropoietin: 28.8 m[IU]/mL — ABNORMAL HIGH (ref 2.6–18.5)

## 2013-09-07 ENCOUNTER — Other Ambulatory Visit: Payer: Self-pay | Admitting: Cardiology

## 2013-09-10 ENCOUNTER — Telehealth: Payer: Self-pay | Admitting: Hematology and Oncology

## 2013-09-10 ENCOUNTER — Other Ambulatory Visit: Payer: BC Managed Care – PPO

## 2013-09-10 NOTE — Telephone Encounter (Signed)
returned pt call and r/s lab totomorrow per pt request...pt aware of new d.t

## 2013-09-11 ENCOUNTER — Telehealth: Payer: Self-pay | Admitting: *Deleted

## 2013-09-11 ENCOUNTER — Other Ambulatory Visit (HOSPITAL_BASED_OUTPATIENT_CLINIC_OR_DEPARTMENT_OTHER): Payer: BC Managed Care – PPO

## 2013-09-11 ENCOUNTER — Other Ambulatory Visit: Payer: BC Managed Care – PPO

## 2013-09-11 DIAGNOSIS — C9310 Chronic myelomonocytic leukemia not having achieved remission: Secondary | ICD-10-CM

## 2013-09-11 DIAGNOSIS — C9200 Acute myeloblastic leukemia, not having achieved remission: Secondary | ICD-10-CM

## 2013-09-11 LAB — CBC & DIFF AND RETIC
BASO%: 0.3 % (ref 0.0–2.0)
Basophils Absolute: 0 10*3/uL (ref 0.0–0.1)
EOS ABS: 0.1 10*3/uL (ref 0.0–0.5)
EOS%: 1.3 % (ref 0.0–7.0)
HCT: 26.8 % — ABNORMAL LOW (ref 34.8–46.6)
HGB: 8.8 g/dL — ABNORMAL LOW (ref 11.6–15.9)
IMMATURE RETIC FRACT: 10.2 % — AB (ref 1.60–10.00)
LYMPH#: 1.5 10*3/uL (ref 0.9–3.3)
LYMPH%: 22.5 % (ref 14.0–49.7)
MCH: 30.4 pg (ref 25.1–34.0)
MCHC: 32.8 g/dL (ref 31.5–36.0)
MCV: 92.7 fL (ref 79.5–101.0)
MONO#: 1.8 10*3/uL — AB (ref 0.1–0.9)
MONO%: 26.8 % — ABNORMAL HIGH (ref 0.0–14.0)
NEUT%: 49.1 % (ref 38.4–76.8)
NEUTROS ABS: 3.3 10*3/uL (ref 1.5–6.5)
Platelets: 280 10*3/uL (ref 145–400)
RBC: 2.89 10*6/uL — AB (ref 3.70–5.45)
RDW: 18.9 % — ABNORMAL HIGH (ref 11.2–14.5)
RETIC %: 1.58 % (ref 0.70–2.10)
RETIC CT ABS: 45.66 10*3/uL (ref 33.70–90.70)
WBC: 6.7 10*3/uL (ref 3.9–10.3)

## 2013-09-11 LAB — HOLD TUBE, BLOOD BANK

## 2013-09-11 LAB — TECHNOLOGIST REVIEW

## 2013-09-11 NOTE — Telephone Encounter (Signed)
S/w pt in lobby.  Gave her CBC results,  Hgb is 8.8.  Informed no need for transfusion.  Pt states she is surprised b/c she has been feeling very weak and dizzy for the past week.  She thinks she may be dehydrated. She has mouth sores since getting antibiotics on recent hospitalization for pneumonia.  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  She asks if she can be scheduled for IVFs?  Pt also wants to get Epogen when she comes back to see Dr. Alvy Bimler on 6/11.  Informed her we may be able to get medication pre-certed so she can get it on 6/11 if Dr. Alvy Bimler orders.   Pt's daughter here to take pt home and nurse will call her back this afternoon after speaking w/ Dr. Alvy Bimler. Pt willing to come in tomorrow or Friday for IVFs if Dr. Alvy Bimler will order them. She also warns she is a very difficult IV stick.

## 2013-09-12 ENCOUNTER — Other Ambulatory Visit: Payer: Self-pay | Admitting: Hematology and Oncology

## 2013-09-12 ENCOUNTER — Telehealth: Payer: Self-pay | Admitting: Hematology and Oncology

## 2013-09-12 NOTE — Telephone Encounter (Signed)
, °

## 2013-09-12 NOTE — Telephone Encounter (Signed)
Informed pt Dr. Alvy Bimler willing to order IVFs and we have room tomorrow morning at 8 am.  Pt states understanding and she plans to come.  Pt says she will call to cancel if she is feeling better later today.

## 2013-09-13 ENCOUNTER — Other Ambulatory Visit (INDEPENDENT_AMBULATORY_CARE_PROVIDER_SITE_OTHER): Payer: Self-pay

## 2013-09-13 ENCOUNTER — Telehealth (INDEPENDENT_AMBULATORY_CARE_PROVIDER_SITE_OTHER): Payer: Self-pay

## 2013-09-13 ENCOUNTER — Ambulatory Visit: Payer: BC Managed Care – PPO

## 2013-09-13 MED ORDER — ONDANSETRON HCL 4 MG PO TABS: 4.0000 mg | ORAL_TABLET | Freq: Three times a day (TID) | ORAL | Status: DC | PRN

## 2013-09-13 MED ORDER — DIPHENOXYLATE-ATROPINE 2.5-0.025 MG PO TABS: 1.0000 | ORAL_TABLET | Freq: Every day | ORAL | Status: DC

## 2013-09-13 NOTE — Telephone Encounter (Signed)
May refill those medications.

## 2013-09-13 NOTE — Telephone Encounter (Signed)
Called pt to let her know that her Zofran was refilled and called into her pharmacy. Her Lomotil was refilled and it is at the front desk for her to pickup. Pt verbalized understanding.

## 2013-09-13 NOTE — Telephone Encounter (Signed)
Pt s/p chronic open abdominal wound. Pt would like to get a refill on her Lomotil and her Zofran. Pt states that she is still having nausea. Informed pt that I would send Dr Zella Richer a message for her refills. Pt verbalized understanding and agrees with POC.

## 2013-09-14 ENCOUNTER — Other Ambulatory Visit (INDEPENDENT_AMBULATORY_CARE_PROVIDER_SITE_OTHER): Payer: Self-pay | Admitting: General Surgery

## 2013-09-18 ENCOUNTER — Other Ambulatory Visit: Payer: BC Managed Care – PPO

## 2013-09-19 ENCOUNTER — Telehealth: Payer: Self-pay | Admitting: Hematology and Oncology

## 2013-09-19 ENCOUNTER — Encounter (INDEPENDENT_AMBULATORY_CARE_PROVIDER_SITE_OTHER): Payer: BC Managed Care – PPO | Admitting: General Surgery

## 2013-09-19 NOTE — Telephone Encounter (Signed)
returned tp call and r/s missed appt...done...pt aware of new d.t

## 2013-09-20 ENCOUNTER — Other Ambulatory Visit: Payer: BC Managed Care – PPO

## 2013-09-26 ENCOUNTER — Ambulatory Visit (HOSPITAL_BASED_OUTPATIENT_CLINIC_OR_DEPARTMENT_OTHER): Payer: BC Managed Care – PPO

## 2013-09-26 ENCOUNTER — Ambulatory Visit (HOSPITAL_COMMUNITY)
Admission: RE | Admit: 2013-09-26 | Discharge: 2013-09-26 | Disposition: A | Discharge status: Home / Self Care | Payer: BC Managed Care – PPO | Source: Ambulatory Visit | Attending: Hematology and Oncology | Admitting: Hematology and Oncology

## 2013-09-26 ENCOUNTER — Other Ambulatory Visit (HOSPITAL_BASED_OUTPATIENT_CLINIC_OR_DEPARTMENT_OTHER): Payer: BC Managed Care – PPO

## 2013-09-26 ENCOUNTER — Ambulatory Visit (HOSPITAL_BASED_OUTPATIENT_CLINIC_OR_DEPARTMENT_OTHER): Payer: BC Managed Care – PPO | Admitting: Hematology and Oncology

## 2013-09-26 ENCOUNTER — Encounter: Payer: Self-pay | Admitting: Hematology and Oncology

## 2013-09-26 ENCOUNTER — Telehealth: Payer: Self-pay | Admitting: Hematology and Oncology

## 2013-09-26 ENCOUNTER — Other Ambulatory Visit: Payer: Self-pay | Admitting: Hematology and Oncology

## 2013-09-26 VITALS — BP 78/50 | HR 95 | Temp 97.6°F | Resp 20 | Ht 65.0 in | Wt 124.6 lb

## 2013-09-26 DIAGNOSIS — J4489 Other specified chronic obstructive pulmonary disease: Secondary | ICD-10-CM | POA: Insufficient documentation

## 2013-09-26 DIAGNOSIS — E538 Deficiency of other specified B group vitamins: Secondary | ICD-10-CM

## 2013-09-26 DIAGNOSIS — C9310 Chronic myelomonocytic leukemia not having achieved remission: Secondary | ICD-10-CM

## 2013-09-26 DIAGNOSIS — D638 Anemia in other chronic diseases classified elsewhere: Secondary | ICD-10-CM | POA: Insufficient documentation

## 2013-09-26 DIAGNOSIS — S31109A Unspecified open wound of abdominal wall, unspecified quadrant without penetration into peritoneal cavity, initial encounter: Secondary | ICD-10-CM

## 2013-09-26 DIAGNOSIS — J449 Chronic obstructive pulmonary disease, unspecified: Secondary | ICD-10-CM

## 2013-09-26 DIAGNOSIS — F17200 Nicotine dependence, unspecified, uncomplicated: Secondary | ICD-10-CM

## 2013-09-26 DIAGNOSIS — Z720 Tobacco use: Secondary | ICD-10-CM

## 2013-09-26 DIAGNOSIS — I959 Hypotension, unspecified: Secondary | ICD-10-CM

## 2013-09-26 DIAGNOSIS — R197 Diarrhea, unspecified: Secondary | ICD-10-CM

## 2013-09-26 DIAGNOSIS — C9210 Chronic myeloid leukemia, BCR/ABL-positive, not having achieved remission: Secondary | ICD-10-CM

## 2013-09-26 DIAGNOSIS — E860 Dehydration: Secondary | ICD-10-CM

## 2013-09-26 DIAGNOSIS — H60399 Other infective otitis externa, unspecified ear: Secondary | ICD-10-CM

## 2013-09-26 DIAGNOSIS — Z932 Ileostomy status: Secondary | ICD-10-CM

## 2013-09-26 DIAGNOSIS — H6090 Unspecified otitis externa, unspecified ear: Secondary | ICD-10-CM | POA: Insufficient documentation

## 2013-09-26 DIAGNOSIS — R0600 Dyspnea, unspecified: Secondary | ICD-10-CM

## 2013-09-26 HISTORY — DX: Chronic obstructive pulmonary disease, unspecified: J44.9

## 2013-09-26 LAB — CBC & DIFF AND RETIC
BASO%: 0.2 % (ref 0.0–2.0)
BASOS ABS: 0 10*3/uL (ref 0.0–0.1)
EOS ABS: 0.1 10*3/uL (ref 0.0–0.5)
EOS%: 2 % (ref 0.0–7.0)
HEMATOCRIT: 22.6 % — AB (ref 34.8–46.6)
HEMOGLOBIN: 7.3 g/dL — AB (ref 11.6–15.9)
Immature Retic Fract: 17 % — ABNORMAL HIGH (ref 1.60–10.00)
LYMPH%: 24.2 % (ref 14.0–49.7)
MCH: 30.4 pg (ref 25.1–34.0)
MCHC: 32.3 g/dL (ref 31.5–36.0)
MCV: 94.2 fL (ref 79.5–101.0)
MONO#: 1.3 10*3/uL — ABNORMAL HIGH (ref 0.1–0.9)
MONO%: 27 % — AB (ref 0.0–14.0)
NEUT#: 2.3 10*3/uL (ref 1.5–6.5)
NEUT%: 46.6 % (ref 38.4–76.8)
PLATELETS: 252 10*3/uL (ref 145–400)
RBC: 2.4 10*6/uL — AB (ref 3.70–5.45)
RDW: 19.7 % — ABNORMAL HIGH (ref 11.2–14.5)
Retic %: 2.46 % — ABNORMAL HIGH (ref 0.70–2.10)
Retic Ct Abs: 59.04 10*3/uL (ref 33.70–90.70)
WBC: 5 10*3/uL (ref 3.9–10.3)
lymph#: 1.2 10*3/uL (ref 0.9–3.3)

## 2013-09-26 LAB — PREPARE RBC (CROSSMATCH)

## 2013-09-26 LAB — HOLD TUBE, BLOOD BANK

## 2013-09-26 LAB — TECHNOLOGIST REVIEW

## 2013-09-26 MED ORDER — OCTREOTIDE ACETATE 20 MG IM KIT
20.0000 mg | PACK | Freq: Once | INTRAMUSCULAR | Status: AC
  Administered 2013-09-26: 20 mg via INTRAMUSCULAR
  Filled 2013-09-26: qty 1

## 2013-09-26 MED ORDER — CIPROFLOXACIN-HYDROCORTISONE 0.2-1 % OT SUSP: 3.0000 [drp] | Freq: Two times a day (BID) | OTIC | Status: DC

## 2013-09-26 MED ORDER — SODIUM CHLORIDE 0.9 % IV SOLN: 250.0000 mL | Freq: Once | INTRAVENOUS | Status: DC

## 2013-09-26 NOTE — Assessment & Plan Note (Signed)
Discussing high output diarrhea. Hopefully with blood transfusion, this would help with her symptoms.

## 2013-09-26 NOTE — Assessment & Plan Note (Signed)
Continue Imodium and Lomotil. I will start her on the trial of octreotide.

## 2013-09-26 NOTE — Assessment & Plan Note (Signed)
She is not on any treatment due to low IPSS score and poor performance status. Continue close followup and transfusion support as needed.

## 2013-09-26 NOTE — Assessment & Plan Note (Signed)
We discussed some of the risks, benefits, and alternatives of blood transfusions. The patient is symptomatic from anemia and the hemoglobin level is critically low.  Some of the side-effects to be expected including risks of transfusion reactions, chills, infection, syndrome of volume overload and risk of hospitalization from various reasons and the patient is willing to proceed and went ahead to sign consent today. I would not recommend Erythropoetin stimulating agents now as I do not think it would be beneficial for her due to chronic unresolved infection.

## 2013-09-26 NOTE — Assessment & Plan Note (Addendum)
She continues to smoke. Nicotine cessation counseling was provided. She continues to cough. I recommend referral to a pulmonologist for COPD management and she agreed.

## 2013-09-26 NOTE — Assessment & Plan Note (Signed)
I spent some time counseling the patient the importance of tobacco cessation. she is currently attempting to quit on her own 

## 2013-09-26 NOTE — Progress Notes (Signed)
Pt.'s BP-78/50 at MD visit, MD aware.  Pt does not need IV fluids today per Dr. Alvy Bimler.  MD notified that pt will return on 09/27/13 d/t blood bank unable to obtain blood until then d/t antibodies.

## 2013-09-26 NOTE — Assessment & Plan Note (Signed)
Clinically, she appears dehydrated. She has no venous access. Recommend increase oral fluid intake.

## 2013-09-26 NOTE — Assessment & Plan Note (Signed)
This has not healed for over 30 years. I continue to encourage her to quit smoking.

## 2013-09-26 NOTE — Assessment & Plan Note (Signed)
Since adequately replaced. Continue oral B12.

## 2013-09-26 NOTE — Assessment & Plan Note (Signed)
This is due to dehydration. Transfusion should be helpful.

## 2013-09-26 NOTE — Assessment & Plan Note (Signed)
I will prescribe eardrops for her.

## 2013-09-26 NOTE — Patient Instructions (Signed)

## 2013-09-26 NOTE — Telephone Encounter (Signed)
gv adn printed appt sched and avs for pt for June and July....sed added tx. °

## 2013-09-26 NOTE — Progress Notes (Signed)
[place] OFFICE PROGRESS NOTE  Patient Care Team: Darlin Coco, MD as PCP - General (Cardiology) Winfield Cunas., MD (Gastroenterology) Garald Balding, MD (Orthopedic Surgery) Odis Hollingshead, MD as Attending Physician (General Surgery) Heath Lark, MD as Consulting Physician (Hematology and Oncology)  SUMMARY OF ONCOLOGIC HISTORY: DIAGNOSIS: Chronic anemia with monocytosis, B12 deficiency, CMML  SUMMARY OF ONCOLOGIC HISTORY: The patient had recurrent ischemic colitis in 2013. She has significant surgery, sepsis, recurrent hospitalization including intensive care, recurrent blood transfusion, finally was referred to see a hematologist for evaluation. She had extensive workup and subsequently was found to have B12 deficiency along with the diagnosis of CMML. She had a bone marrow aspirate and biopsy which was not grossly abnormal but suggestive of possible diagnosis of CMML due to mild dyspoiesis with mildly elevated monocyte count. She was being observed. Since she was last seen here, she had repeated admission to the hospital for recurrent infection.  INTERVAL HISTORY: Please see below for problem oriented charting. She complained of fatigue. She continued to have high output diarrhea. She felt dizzy overall. She continued to have oozing from her wound which is not healed. She has no discharge from her right ear REVIEW OF SYSTEMS:   Constitutional: Denies fevers, chills or abnormal weight loss Eyes: Denies blurriness of vision Ears, nose, mouth, throat, and face: Denies mucositis or sore throat Cardiovascular: Denies palpitation, chest discomfort or lower extremity swelling Skin: Denies abnormal skin rashes Lymphatics: Denies new lymphadenopathy or easy bruising Neurological:Denies numbness, tingling  Behavioral/Psych: Mood is stable, no new changes  All other systems were reviewed with the patient and are negative.  I have reviewed the past medical  history, past surgical history, social history and family history with the patient and they are unchanged from previous note.  ALLERGIES:  is allergic to vancomycin; ativan; codeine; tetanus toxoids; penicillins; and xarelto.  MEDICATIONS:  Current Outpatient Prescriptions  Medication Sig Dispense Refill  . albuterol (PROVENTIL HFA;VENTOLIN HFA) 108 (90 BASE) MCG/ACT inhaler Inhale 1-2 puffs into the lungs every 6 (six) hours as needed for wheezing.      Marland Kitchen aspirin 325 MG EC tablet Take 325 mg by mouth daily.      . bisoprolol (ZEBETA) 5 MG tablet take 1 tablet by mouth once daily BEFORE BREAKFAST  90 tablet  1  . buPROPion (WELLBUTRIN XL) 300 MG 24 hr tablet Take 300 mg by mouth daily before breakfast.       . cyclobenzaprine (FLEXERIL) 10 MG tablet Take 10 mg by mouth 3 (three) times daily as needed for muscle spasms.       . diphenoxylate-atropine (LOMOTIL) 2.5-0.025 MG per tablet Take 1 tablet by mouth at bedtime.  30 tablet  1  . esomeprazole (NEXIUM) 40 MG capsule Take 40 mg by mouth every morning.       Marland Kitchen estradiol (ESTRACE) 2 MG tablet Take 2 mg by mouth daily before breakfast.       . furosemide (LASIX) 40 MG tablet Take 40 mg by mouth as needed for fluid or edema.       Marland Kitchen guaiFENesin (MUCINEX) 600 MG 12 hr tablet Take 1 tablet (600 mg total) by mouth 2 (two) times daily.      Marland Kitchen HYDROcodone-acetaminophen (NORCO) 10-325 MG per tablet Take 1 tablet by mouth every 4 (four) hours as needed for moderate pain.       Marland Kitchen loperamide (IMODIUM) 2 MG capsule Take 2 mg by mouth 4 (four) times daily  as needed for diarrhea or loose stools.      Marland Kitchen loratadine-pseudoephedrine (CLARITIN-D 12-HOUR) 5-120 MG per tablet Take 1 tablet by mouth daily as needed for allergies.       Marland Kitchen ondansetron (ZOFRAN) 4 MG tablet take 1 tablet by mouth every 8 hours if needed for nausea and vomiting  30 tablet  3  . sodium bicarbonate 650 MG tablet Take 1 tablet (650 mg total) by mouth 3 (three) times daily.  90 tablet  3  .  celecoxib (CELEBREX) 200 MG capsule Take 200 mg by mouth daily.      . ciprofloxacin-hydrocortisone (CIPRO HC) otic suspension Place 3 drops into the right ear 2 (two) times daily.  10 mL  0   No current facility-administered medications for this visit.   Facility-Administered Medications Ordered in Other Visits  Medication Dose Route Frequency Provider Last Rate Last Dose  . sodium chloride 0.9 % injection 10 mL  10 mL Intravenous PRN Heath Lark, MD   10 mL at 08/28/13 1219    PHYSICAL EXAMINATION: ECOG PERFORMANCE STATUS: 3 - Symptomatic, >50% confined to bed  Filed Vitals:   09/26/13 1009  BP: 78/50  Pulse: 95  Temp: 97.6 F (36.4 C)  Resp: 20   Filed Weights   09/26/13 1009  Weight: 124 lb 9.6 oz (56.518 kg)    GENERAL:alert, no distress and comfortable. She looks thin SKIN: skin color is pale, texture, no rashes or significant lesions but I did not remove the bandage on her abdomen EYES: normal, Conjunctiva are pale and non-injected, sclera clear OROPHARYNX:no exudate, no erythema and lips, buccal mucosa, and tongue dry. Noted evidence of right ear otitis externa. NECK: supple, thyroid normal size, non-tender, without nodularity LYMPH:  no palpable lymphadenopathy in the cervical, axillary or inguinal LUNGS: clear to auscultation and percussion with normal breathing effort HEART: regular rate & rhythm and no murmurs and no lower extremity edema ABDOMEN:abdomen soft, non-tender and normal bowel sounds Musculoskeletal:no cyanosis of digits and no clubbing  NEURO: alert & oriented x 3 with fluent speech, no focal motor/sensory deficits  LABORATORY DATA:  I have reviewed the data as listed    Component Value Date/Time   NA 133* 08/28/2013 1211   NA 130* 08/26/2013 0540   K 3.7 08/28/2013 1211   K 4.2 08/26/2013 0540   CL 101 08/26/2013 0540   CL 101 02/09/2012 1321   CO2 16* 08/28/2013 1211   CO2 16* 08/26/2013 0540   GLUCOSE 79 08/28/2013 1211   GLUCOSE 76 08/26/2013 0540    GLUCOSE 79 02/09/2012 1321   BUN 8.8 08/28/2013 1211   BUN 18 08/26/2013 0540   CREATININE 0.8 08/28/2013 1211   CREATININE 0.96 08/26/2013 0540   CREATININE 0.92 08/10/2012 1528   CALCIUM 6.7* 08/28/2013 1211   CALCIUM 6.4* 08/26/2013 0540   PROT 6.9 08/28/2013 1211   PROT 7.2 08/23/2013 0445   ALBUMIN 2.4* 08/28/2013 1211   ALBUMIN 2.5* 08/23/2013 0445   AST 19 08/28/2013 1211   AST 15 08/23/2013 0445   ALT 14 08/28/2013 1211   ALT 10 08/23/2013 0445   ALKPHOS 495* 08/28/2013 1211   ALKPHOS 518* 08/23/2013 0445   BILITOT 0.37 08/28/2013 1211   BILITOT 0.7 08/23/2013 0445   GFRNONAA 61* 08/26/2013 0540   GFRAA 71* 08/26/2013 0540    No results found for this basename: SPEP,  UPEP,   kappa and lambda light chains    Lab Results  Component Value Date   WBC  5.0 09/26/2013   NEUTROABS 2.3 09/26/2013   HGB 7.3* 09/26/2013   HCT 22.6* 09/26/2013   MCV 94.2 09/26/2013   PLT 252 09/26/2013      Chemistry      Component Value Date/Time   NA 133* 08/28/2013 1211   NA 130* 08/26/2013 0540   K 3.7 08/28/2013 1211   K 4.2 08/26/2013 0540   CL 101 08/26/2013 0540   CL 101 02/09/2012 1321   CO2 16* 08/28/2013 1211   CO2 16* 08/26/2013 0540   BUN 8.8 08/28/2013 1211   BUN 18 08/26/2013 0540   CREATININE 0.8 08/28/2013 1211   CREATININE 0.96 08/26/2013 0540   CREATININE 0.92 08/10/2012 1528      Component Value Date/Time   CALCIUM 6.7* 08/28/2013 1211   CALCIUM 6.4* 08/26/2013 0540   ALKPHOS 495* 08/28/2013 1211   ALKPHOS 518* 08/23/2013 0445   AST 19 08/28/2013 1211   AST 15 08/23/2013 0445   ALT 14 08/28/2013 1211   ALT 10 08/23/2013 0445   BILITOT 0.37 08/28/2013 1211   BILITOT 0.7 08/23/2013 0445     ASSESSMENT & PLAN:  CMML (chronic myelomonocytic leukemia) She is not on any treatment due to low IPSS score and poor performance status. Continue close followup and transfusion support as needed.  Anemia of other chronic disease We discussed some of the risks, benefits, and alternatives of blood transfusions. The  patient is symptomatic from anemia and the hemoglobin level is critically low.  Some of the side-effects to be expected including risks of transfusion reactions, chills, infection, syndrome of volume overload and risk of hospitalization from various reasons and the patient is willing to proceed and went ahead to sign consent today. I would not recommend Erythropoetin stimulating agents now as I do not think it would be beneficial for her due to chronic unresolved infection.  B12 deficiency Since adequately replaced. Continue oral B12.  Ileostomy status Discussing high output diarrhea. Hopefully with blood transfusion, this would help with her symptoms.  Dehydration Clinically, she appears dehydrated. She has no venous access. Recommend increase oral fluid intake.  COPD (chronic obstructive pulmonary disease) She continues to smoke. Nicotine cessation counseling was provided. She continues to cough. I recommend referral to a pulmonologist for COPD management and she agreed.  Tobacco abuse I spent some time counseling the patient the importance of tobacco cessation. she is currently attempting to quit on her own   Hypotension This is due to dehydration. Transfusion should be helpful.  Open abdominal wall wound This has not healed for over 30 years. I continue to encourage her to quit smoking.  Otitis externa I will prescribe eardrops for her.  Diarrhea Continue Imodium and Lomotil. I will start her on the trial of octreotide.      Orders Placed This Encounter  Procedures  . Ambulatory referral to Pulmonology    Referral Priority:  Routine    Referral Type:  Consultation    Referral Reason:  Specialty Services Required    Requested Specialty:  Pulmonary Disease    Number of Visits Requested:  1   All questions were answered. The patient knows to call the clinic with any problems, questions or concerns. No barriers to learning was detected.    Winfield, Jennings,  MD 09/26/2013 10:20 PM

## 2013-09-27 ENCOUNTER — Ambulatory Visit (HOSPITAL_BASED_OUTPATIENT_CLINIC_OR_DEPARTMENT_OTHER): Payer: BC Managed Care – PPO

## 2013-09-27 ENCOUNTER — Other Ambulatory Visit: Payer: Self-pay | Admitting: *Deleted

## 2013-09-27 VITALS — BP 131/74 | HR 93 | Temp 98.2°F | Resp 18

## 2013-09-27 DIAGNOSIS — D649 Anemia, unspecified: Secondary | ICD-10-CM

## 2013-09-27 MED ORDER — SODIUM CHLORIDE 0.9 % IV SOLN
Freq: Once | INTRAVENOUS | Status: AC
  Administered 2013-09-27: 12:00:00 via INTRAVENOUS

## 2013-09-27 NOTE — Progress Notes (Signed)
Blood consent obtained today, patient tolerated blood infusions well.

## 2013-09-27 NOTE — Patient Instructions (Signed)
Blood Transfusion Information WHAT IS A BLOOD TRANSFUSION? A transfusion is the replacement of blood or some of its parts. Blood is made up of multiple cells which provide different functions.  Red blood cells carry oxygen and are used for blood loss replacement.  White blood cells fight against infection.  Platelets control bleeding.  Plasma helps clot blood.  Other blood products are available for specialized needs, such as hemophilia or other clotting disorders. BEFORE THE TRANSFUSION  Who gives blood for transfusions?   You may be able to donate blood to be used at a later date on yourself (autologous donation).  Relatives can be asked to donate blood. This is generally not any safer than if you have received blood from a stranger. The same precautions are taken to ensure safety when a relative's blood is donated.  Healthy volunteers who are fully evaluated to make sure their blood is safe. This is blood bank blood. Transfusion therapy is the safest it has ever been in the practice of medicine. Before blood is taken from a donor, a complete history is taken to make sure that person has no history of diseases nor engages in risky social behavior (examples are intravenous drug use or sexual activity with multiple partners). The donor's travel history is screened to minimize risk of transmitting infections, such as malaria. The donated blood is tested for signs of infectious diseases, such as HIV and hepatitis. The blood is then tested to be sure it is compatible with you in order to minimize the chance of a transfusion reaction. If you or a relative donates blood, this is often done in anticipation of surgery and is not appropriate for emergency situations. It takes many days to process the donated blood. RISKS AND COMPLICATIONS Although transfusion therapy is very safe and saves many lives, the main dangers of transfusion include:   Getting an infectious disease.  Developing a  transfusion reaction. This is an allergic reaction to something in the blood you were given. Every precaution is taken to prevent this. The decision to have a blood transfusion has been considered carefully by your caregiver before blood is given. Blood is not given unless the benefits outweigh the risks. AFTER THE TRANSFUSION  Right after receiving a blood transfusion, you will usually feel much better and more energetic. This is especially true if your red blood cells have gotten low (anemic). The transfusion raises the level of the red blood cells which carry oxygen, and this usually causes an energy increase.  The nurse administering the transfusion will monitor you carefully for complications. HOME CARE INSTRUCTIONS  No special instructions are needed after a transfusion. You may find your energy is better. Speak with your caregiver about any limitations on activity for underlying diseases you may have. SEEK MEDICAL CARE IF:   Your condition is not improving after your transfusion.  You develop redness or irritation at the intravenous (IV) site. SEEK IMMEDIATE MEDICAL CARE IF:  Any of the following symptoms occur over the next 12 hours:  Shaking chills.  You have a temperature by mouth above 102 F (38.9 C), not controlled by medicine.  Chest, back, or muscle pain.  People around you feel you are not acting correctly or are confused.  Shortness of breath or difficulty breathing.  Dizziness and fainting.  You get a rash or develop hives.  You have a decrease in urine output.  Your urine turns a dark color or changes to pink, red, or brown. Any of the following   symptoms occur over the next 10 days:  You have a temperature by mouth above 102 F (38.9 C), not controlled by medicine.  Shortness of breath.  Weakness after normal activity.  The white part of the eye turns yellow (jaundice).  You have a decrease in the amount of urine or are urinating less often.  Your  urine turns a dark color or changes to pink, red, or brown. Document Released: 04/01/2000 Document Revised: 06/27/2011 Document Reviewed: 11/19/2007 [employer] Patient Information 2014 [employer].  

## 2013-09-29 LAB — TYPE AND SCREEN
ABO/RH(D): A POS
ANTIBODY SCREEN: POSITIVE
DAT, IgG: POSITIVE
DONOR AG TYPE: NEGATIVE
Donor AG Type: NEGATIVE
Unit division: 0
Unit division: 0

## 2013-10-15 ENCOUNTER — Encounter (INDEPENDENT_AMBULATORY_CARE_PROVIDER_SITE_OTHER): Payer: Self-pay | Admitting: General Surgery

## 2013-10-15 ENCOUNTER — Inpatient Hospital Stay (HOSPITAL_COMMUNITY)
Admission: EM | Admit: 2013-10-15 | Discharge: 2013-10-18 | DRG: 641 | Disposition: A | Discharge status: Home / Self Care | Payer: BC Managed Care – PPO | Attending: Internal Medicine | Admitting: Internal Medicine

## 2013-10-15 ENCOUNTER — Emergency Department (HOSPITAL_COMMUNITY): Payer: BC Managed Care – PPO

## 2013-10-15 ENCOUNTER — Ambulatory Visit (INDEPENDENT_AMBULATORY_CARE_PROVIDER_SITE_OTHER): Payer: BC Managed Care – PPO | Admitting: General Surgery

## 2013-10-15 ENCOUNTER — Encounter (HOSPITAL_COMMUNITY): Payer: Self-pay | Admitting: Emergency Medicine

## 2013-10-15 VITALS — BP 80/60 | Ht 65.0 in | Wt 118.2 lb

## 2013-10-15 DIAGNOSIS — Z9049 Acquired absence of other specified parts of digestive tract: Secondary | ICD-10-CM

## 2013-10-15 DIAGNOSIS — C9310 Chronic myelomonocytic leukemia not having achieved remission: Secondary | ICD-10-CM

## 2013-10-15 DIAGNOSIS — E860 Dehydration: Secondary | ICD-10-CM

## 2013-10-15 DIAGNOSIS — Z79899 Other long term (current) drug therapy: Secondary | ICD-10-CM

## 2013-10-15 DIAGNOSIS — E872 Acidosis, unspecified: Secondary | ICD-10-CM | POA: Diagnosis present

## 2013-10-15 DIAGNOSIS — Z96649 Presence of unspecified artificial hip joint: Secondary | ICD-10-CM

## 2013-10-15 DIAGNOSIS — N179 Acute kidney failure, unspecified: Secondary | ICD-10-CM | POA: Diagnosis present

## 2013-10-15 DIAGNOSIS — Z720 Tobacco use: Secondary | ICD-10-CM | POA: Diagnosis present

## 2013-10-15 DIAGNOSIS — D630 Anemia in neoplastic disease: Secondary | ICD-10-CM | POA: Diagnosis present

## 2013-10-15 DIAGNOSIS — J4489 Other specified chronic obstructive pulmonary disease: Secondary | ICD-10-CM | POA: Diagnosis present

## 2013-10-15 DIAGNOSIS — Z8249 Family history of ischemic heart disease and other diseases of the circulatory system: Secondary | ICD-10-CM

## 2013-10-15 DIAGNOSIS — Z823 Family history of stroke: Secondary | ICD-10-CM

## 2013-10-15 DIAGNOSIS — F17200 Nicotine dependence, unspecified, uncomplicated: Secondary | ICD-10-CM | POA: Diagnosis present

## 2013-10-15 DIAGNOSIS — D638 Anemia in other chronic diseases classified elsewhere: Secondary | ICD-10-CM | POA: Diagnosis present

## 2013-10-15 DIAGNOSIS — K219 Gastro-esophageal reflux disease without esophagitis: Secondary | ICD-10-CM | POA: Diagnosis present

## 2013-10-15 DIAGNOSIS — Z7982 Long term (current) use of aspirin: Secondary | ICD-10-CM

## 2013-10-15 DIAGNOSIS — Z22322 Carrier or suspected carrier of Methicillin resistant Staphylococcus aureus: Secondary | ICD-10-CM

## 2013-10-15 DIAGNOSIS — N390 Urinary tract infection, site not specified: Secondary | ICD-10-CM | POA: Diagnosis present

## 2013-10-15 DIAGNOSIS — I4891 Unspecified atrial fibrillation: Secondary | ICD-10-CM | POA: Diagnosis present

## 2013-10-15 DIAGNOSIS — E871 Hypo-osmolality and hyponatremia: Secondary | ICD-10-CM | POA: Diagnosis present

## 2013-10-15 DIAGNOSIS — Z8701 Personal history of pneumonia (recurrent): Secondary | ICD-10-CM

## 2013-10-15 DIAGNOSIS — C9210 Chronic myeloid leukemia, BCR/ABL-positive, not having achieved remission: Secondary | ICD-10-CM | POA: Diagnosis present

## 2013-10-15 DIAGNOSIS — E559 Vitamin D deficiency, unspecified: Secondary | ICD-10-CM | POA: Diagnosis present

## 2013-10-15 DIAGNOSIS — I10 Essential (primary) hypertension: Secondary | ICD-10-CM | POA: Diagnosis present

## 2013-10-15 DIAGNOSIS — Z932 Ileostomy status: Secondary | ICD-10-CM

## 2013-10-15 DIAGNOSIS — Z8614 Personal history of Methicillin resistant Staphylococcus aureus infection: Secondary | ICD-10-CM

## 2013-10-15 DIAGNOSIS — J449 Chronic obstructive pulmonary disease, unspecified: Secondary | ICD-10-CM | POA: Diagnosis present

## 2013-10-15 LAB — CBC WITH DIFFERENTIAL/PLATELET
BASOS PCT: 0 % (ref 0–1)
Basophils Absolute: 0 10*3/uL (ref 0.0–0.1)
EOS PCT: 0 % (ref 0–5)
Eosinophils Absolute: 0 10*3/uL (ref 0.0–0.7)
HCT: 23.9 % — ABNORMAL LOW (ref 36.0–46.0)
HEMOGLOBIN: 8 g/dL — AB (ref 12.0–15.0)
LYMPHS ABS: 2.4 10*3/uL (ref 0.7–4.0)
Lymphocytes Relative: 20 % (ref 12–46)
MCH: 30.8 pg (ref 26.0–34.0)
MCHC: 33.5 g/dL (ref 30.0–36.0)
MCV: 91.9 fL (ref 78.0–100.0)
Monocytes Absolute: 2.6 10*3/uL — ABNORMAL HIGH (ref 0.1–1.0)
Monocytes Relative: 22 % — ABNORMAL HIGH (ref 3–12)
NEUTROS PCT: 58 % (ref 43–77)
Neutro Abs: 6.9 10*3/uL (ref 1.7–7.7)
Platelets: 317 10*3/uL (ref 150–400)
RBC: 2.6 MIL/uL — AB (ref 3.87–5.11)
RDW: 18.7 % — ABNORMAL HIGH (ref 11.5–15.5)
WBC: 11.9 10*3/uL — ABNORMAL HIGH (ref 4.0–10.5)

## 2013-10-15 LAB — FOLATE: FOLATE: 13.2 ng/mL

## 2013-10-15 LAB — VITAMIN B12: VITAMIN B 12: 884 pg/mL (ref 211–911)

## 2013-10-15 LAB — COMPREHENSIVE METABOLIC PANEL
ALT: 14 U/L (ref 0–35)
AST: 21 U/L (ref 0–37)
Albumin: 2.7 g/dL — ABNORMAL LOW (ref 3.5–5.2)
Alkaline Phosphatase: 551 U/L — ABNORMAL HIGH (ref 39–117)
BILIRUBIN TOTAL: 0.4 mg/dL (ref 0.3–1.2)
BUN: 17 mg/dL (ref 6–23)
CO2: 13 meq/L — AB (ref 19–32)
Calcium: 6 mg/dL — CL (ref 8.4–10.5)
Chloride: 100 mEq/L (ref 96–112)
Creatinine, Ser: 1.29 mg/dL — ABNORMAL HIGH (ref 0.50–1.10)
GFR, EST AFRICAN AMERICAN: 50 mL/min — AB (ref >90)
GFR, EST NON AFRICAN AMERICAN: 43 mL/min — AB (ref >90)
GLUCOSE: 110 mg/dL — AB (ref 70–99)
Potassium: 4.7 mEq/L (ref 3.7–5.3)
SODIUM: 128 meq/L — AB (ref 137–147)
Total Protein: 7.2 g/dL (ref 6.0–8.3)

## 2013-10-15 LAB — RETICULOCYTES
RBC.: 2.43 MIL/uL — AB (ref 3.87–5.11)
RETIC COUNT ABSOLUTE: 36.5 10*3/uL (ref 19.0–186.0)
Retic Ct Pct: 1.5 % (ref 0.4–3.1)

## 2013-10-15 LAB — IRON AND TIBC
IRON: 127 ug/dL (ref 42–135)
Saturation Ratios: 55 % (ref 20–55)
TIBC: 233 ug/dL — ABNORMAL LOW (ref 250–470)
UIBC: 106 ug/dL — ABNORMAL LOW (ref 125–400)

## 2013-10-15 LAB — PHOSPHORUS: Phosphorus: 3.1 mg/dL (ref 2.3–4.6)

## 2013-10-15 LAB — FERRITIN: Ferritin: 405 ng/mL — ABNORMAL HIGH (ref 10–291)

## 2013-10-15 LAB — MAGNESIUM: MAGNESIUM: 0.8 mg/dL — AB (ref 1.5–2.5)

## 2013-10-15 LAB — MRSA PCR SCREENING: MRSA BY PCR: POSITIVE — AB

## 2013-10-15 MED ORDER — SODIUM CHLORIDE 0.9 % IV SOLN
1000.0000 mL | Freq: Once | INTRAVENOUS | Status: AC
  Administered 2013-10-15: 1000 mL via INTRAVENOUS

## 2013-10-15 MED ORDER — LOPERAMIDE HCL 2 MG PO CAPS
2.0000 mg | ORAL_CAPSULE | Freq: Four times a day (QID) | ORAL | Status: DC
  Administered 2013-10-15 – 2013-10-18 (×11): 2 mg via ORAL
  Filled 2013-10-15 (×15): qty 1

## 2013-10-15 MED ORDER — CHLORHEXIDINE GLUCONATE CLOTH 2 % EX PADS
6.0000 | MEDICATED_PAD | Freq: Every day | CUTANEOUS | Status: DC
  Administered 2013-10-16 – 2013-10-18 (×3): 6 via TOPICAL

## 2013-10-15 MED ORDER — DEXTROSE-NACL 5-0.9 % IV SOLN
INTRAVENOUS | Status: DC
  Administered 2013-10-15 – 2013-10-16 (×2): via INTRAVENOUS

## 2013-10-15 MED ORDER — ASPIRIN EC 325 MG PO TBEC
325.0000 mg | DELAYED_RELEASE_TABLET | Freq: Every morning | ORAL | Status: DC
  Administered 2013-10-16 – 2013-10-18 (×3): 325 mg via ORAL
  Filled 2013-10-15 (×3): qty 1

## 2013-10-15 MED ORDER — ALBUTEROL SULFATE (2.5 MG/3ML) 0.083% IN NEBU: 3.0000 mL | INHALATION_SOLUTION | Freq: Four times a day (QID) | RESPIRATORY_TRACT | Status: DC | PRN

## 2013-10-15 MED ORDER — NICOTINE 14 MG/24HR TD PT24
14.0000 mg | MEDICATED_PATCH | Freq: Every day | TRANSDERMAL | Status: DC
  Administered 2013-10-16 – 2013-10-18 (×3): 14 mg via TRANSDERMAL
  Filled 2013-10-15 (×4): qty 1

## 2013-10-15 MED ORDER — CYCLOBENZAPRINE HCL 10 MG PO TABS
10.0000 mg | ORAL_TABLET | Freq: Three times a day (TID) | ORAL | Status: DC | PRN
  Administered 2013-10-15 – 2013-10-17 (×5): 10 mg via ORAL
  Filled 2013-10-15 (×5): qty 1

## 2013-10-15 MED ORDER — BISOPROLOL FUMARATE 5 MG PO TABS
5.0000 mg | ORAL_TABLET | Freq: Every morning | ORAL | Status: DC
  Administered 2013-10-16 – 2013-10-18 (×3): 5 mg via ORAL
  Filled 2013-10-15 (×3): qty 1

## 2013-10-15 MED ORDER — MUPIROCIN 2 % EX OINT
1.0000 "application " | TOPICAL_OINTMENT | Freq: Two times a day (BID) | CUTANEOUS | Status: DC
  Administered 2013-10-16 – 2013-10-18 (×5): 1 via NASAL
  Filled 2013-10-15: qty 22

## 2013-10-15 MED ORDER — BUPROPION HCL ER (XL) 300 MG PO TB24
300.0000 mg | ORAL_TABLET | Freq: Every day | ORAL | Status: DC
  Administered 2013-10-16 – 2013-10-18 (×3): 300 mg via ORAL
  Filled 2013-10-15 (×4): qty 1

## 2013-10-15 MED ORDER — DIPHENOXYLATE-ATROPINE 2.5-0.025 MG PO TABS
1.0000 | ORAL_TABLET | Freq: Every day | ORAL | Status: DC
  Administered 2013-10-15 – 2013-10-16 (×2): 1 via ORAL
  Filled 2013-10-15 (×2): qty 1

## 2013-10-15 MED ORDER — MAGNESIUM SULFATE 4000MG/100ML IJ SOLN
4.0000 g | Freq: Once | INTRAMUSCULAR | Status: AC
  Administered 2013-10-15: 4 g via INTRAVENOUS
  Filled 2013-10-15: qty 100

## 2013-10-15 MED ORDER — HYDROCOD POLST-CHLORPHEN POLST 10-8 MG/5ML PO LQCR
5.0000 mL | Freq: Two times a day (BID) | ORAL | Status: DC | PRN
  Administered 2013-10-15: 5 mL via ORAL
  Filled 2013-10-15: qty 5

## 2013-10-15 MED ORDER — ALBUTEROL SULFATE HFA 108 (90 BASE) MCG/ACT IN AERS: 1.0000 | INHALATION_SPRAY | Freq: Four times a day (QID) | RESPIRATORY_TRACT | Status: DC | PRN

## 2013-10-15 MED ORDER — CIPROFLOXACIN-HYDROCORTISONE 0.2-1 % OT SUSP
3.0000 [drp] | Freq: Two times a day (BID) | OTIC | Status: DC
  Administered 2013-10-15: 3 [drp] via OTIC
  Filled 2013-10-15: qty 10

## 2013-10-15 MED ORDER — PANTOPRAZOLE SODIUM 40 MG PO TBEC
40.0000 mg | DELAYED_RELEASE_TABLET | Freq: Every day | ORAL | Status: DC
  Administered 2013-10-16 – 2013-10-18 (×3): 40 mg via ORAL
  Filled 2013-10-15 (×3): qty 1

## 2013-10-15 MED ORDER — CELECOXIB 200 MG PO CAPS
200.0000 mg | ORAL_CAPSULE | Freq: Every morning | ORAL | Status: DC
  Administered 2013-10-16 – 2013-10-18 (×3): 200 mg via ORAL
  Filled 2013-10-15 (×3): qty 1

## 2013-10-15 MED ORDER — ONDANSETRON HCL 4 MG PO TABS
4.0000 mg | ORAL_TABLET | Freq: Three times a day (TID) | ORAL | Status: DC | PRN
  Administered 2013-10-17 (×2): 4 mg via ORAL
  Filled 2013-10-15 (×2): qty 1

## 2013-10-15 MED ORDER — CIPROFLOXACIN-HYDROCORTISONE 0.2-1 % OT SUSP
3.0000 [drp] | Freq: Two times a day (BID) | OTIC | Status: DC
  Administered 2013-10-16 – 2013-10-18 (×5): 3 [drp] via OTIC
  Filled 2013-10-15: qty 10

## 2013-10-15 MED ORDER — SODIUM CHLORIDE 0.9 % IV SOLN
1.0000 g | Freq: Once | INTRAVENOUS | Status: AC
  Administered 2013-10-15: 1 g via INTRAVENOUS
  Filled 2013-10-15: qty 10

## 2013-10-15 MED ORDER — HYDROCODONE-ACETAMINOPHEN 10-325 MG PO TABS
1.0000 | ORAL_TABLET | ORAL | Status: DC | PRN
  Administered 2013-10-15 – 2013-10-18 (×11): 1 via ORAL
  Filled 2013-10-15 (×11): qty 1

## 2013-10-15 MED ORDER — ESTRADIOL 2 MG PO TABS
2.0000 mg | ORAL_TABLET | Freq: Every day | ORAL | Status: DC
  Administered 2013-10-16 – 2013-10-18 (×3): 2 mg via ORAL
  Filled 2013-10-15 (×4): qty 1

## 2013-10-15 NOTE — ED Notes (Signed)
Per pt, states was at PCP and BP low-was told to come to ED for fluids

## 2013-10-15 NOTE — Progress Notes (Signed)
CRITICAL VALUE ALERT  Critical value received: MRSA positive  Date of notification:  10/15/13  Time of notification:  2325  Critical value read back:Yes.    Nurse who received alert:  Reynold Bowen, RN  MD notified (1st page):  N/A  Time of first page:  N/A  MD notified (2nd page):  Time of second page:  Responding MD:  N/A  Time MD responded:  N/A  RN put in MRSA positive standing orders. Patient on orange contact.

## 2013-10-15 NOTE — Progress Notes (Signed)
Subjective:     Patient ID: [name], female   DOB: 1951/04/05, 63 y.o.   MRN: [id number]  HPI  She is here for another followup of her chronic open abdominal wound. Since I have seen her, she's had at least one fall. She gets dizzy. She has had a blood transfusion. She still has intermittent high output from her ileostomy. She is feeling very weak today.  Review of Systems  As above.     Objective:   Physical Exam Gen.-she is thin and does not look well.  She has a thready pulse.  Abdomen-loop ileostomy in the right abdomen is present. The wound continues to decrease in size.  Some mesh was debrided from the wound.   Assessment:     Chronic open abdominal wound that continues to slowly close. Hypotension and thready pulse concerning for dehydration.     Plan:     Continue current wound care.  I have sent her to the ED for hydration and a medical evaluation.

## 2013-10-15 NOTE — Progress Notes (Signed)
CRITICAL VALUE ALERT  Critical value received:  Mg 0.8  Date of notification:  10/15/2013  Time of notification:  2012  Critical value read back:Yes.    Nurse who received alert:  Capes, Julieta Gutting RN, CN- notified pt's nurse of critical value  MD notified (1st page):  Levi Aland, NP  Time of first page:  2015  MD notified (2nd page):  Time of second page:  Responding MD:  Levi Aland, NP  Time MD responded:  Entered orders at 2058

## 2013-10-15 NOTE — ED Notes (Signed)
Bad reading on oxygen sensor. O2 sensor taped to forehead.

## 2013-10-15 NOTE — ED Notes (Cosign Needed Addendum)
CRITICAL VALUE ALERT  Critical value received: Calcium 6.0 mg/dL Date of notification:  10/15/2013 Time of notification:  1242 Critical value read back:Yes.   Nurse who received alert: Renita Papa, RN MD notified: Chrisandra Netters, MD Time of notification: 71, face-to-face Primary RN notified: Hinton Dyer at [phone], face-to-face

## 2013-10-15 NOTE — Patient Instructions (Addendum)
Go to the [facility] ER for hydration and evaluation.  Take Kaopectate three times a day, along with Lomotil and Imodium.

## 2013-10-15 NOTE — H&P (Signed)
[facility] History and Physical  [name] VQQ:[id number] DOB: April 02, 1951 DOA: 10/15/2013  Referring physician: Dr. Ashok Cordia PCP: Darlin Coco, MD   Chief Complaint: Dehydration, elevation in serum creatinine, metabolic acidosis  HPI: [name] is a 63 y.o. female  With history of CMML, ischemic colitis status post partial resection of colon as well as ileostomy with high ostomy output of 1.5-1 L per day, atrial fibrillation, history of hyponatremia, and history of anemia. Is presenting to the hospital after recommendations from surgeon to come for further evaluation. Patient was at a routine appointment with surgeon and labwork found several metabolic abnormalities as such she was recommended to followup at the ED. Patient's main complaint is increase in generalized weakness which has been progressive over the last few days. The problem has been persistent and progressively getting worse.  While in the ED patient initially was tachycardic and hypotensive which resolved after IV fluid administration. Patient was found to have hypocalcemia and mild elevation in serum creatinine above baseline of 0.9 to 0.8. Given findings were consulted for admission evaluation and recommendations. Per my discussion with resident pulse oximeter did not have good waveform and therefore was inaccurate   Review of Systems:  14 point review of systems reviewed and negative unless otherwise mentioned above Past Medical History  Diagnosis Date  . Hypertension     She has a past hx of essential  . 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 Inflammation of joint of knee     Since we last last saw her she developed problems with an acute which required surgical drainage by her orthopedist Dr. Durward Fortes.  Marland Kitchen MRSA (methicillin resistant Staphylococcus aureus)     Knee surgery drainage was positive for MRSA and she was treated with 3  weeks of doxycycline successfully.  . Diarrhea     Mild  . Exogenous obesity   . GERD (gastroesophageal reflux disease)     2 hosp.- ischemic colitis - residual of Norovirus, 05/2011- sm. bowel obstruction  . Headache(784.0)     migraine headache on occas, less now than when she was younger   . Arthritis     L hip, back, neck   . History of blood transfusion sept 2013    04/2011- /w ischemic colitis , trouble with matching blood  sept 2013  . Anemia     will see hematology consult prior to surgery, recommended by Dr. Mare Ferrari  . Anemia 12/15/2010  . Ischemic colitis 01/31/2012  . Atrial flutter     during hospitalization, 04/2011- related to anemia & illness/stress   . Pneumonia     04/2011- not hospitalized , pt. denies SOB, changes in chest, breathing  . 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 Dizziness     occasional  . B12 deficiency 02/27/2013  . 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 COPD (chronic obstructive pulmonary disease) 09/26/2013   Past Surgical History  Procedure Laterality Date  . Knee surgery      I&D- 2008, post laceration   . Colonoscopy  05/16/2011    Procedure: COLONOSCOPY;  Surgeon: Winfield Cunas., MD;  Location: [facility];  Service: Endoscopy;  Laterality: N/A;  . Small intestine surgery  1992, 1999  . Laparotomy and lysis of adhesions    . Total hip arthroplasty  10/25/2011    Procedure: TOTAL HIP ARTHROPLASTY;  Surgeon: Garald Balding, MD;  Location: [facility];  Service: Orthopedics;  Laterality: Left;  . Appendectomy  1962  . Abdominal hysterectomy  1988  . Partial colectomy and colostomy  sept 2013    mucous fistula done  . Partial colectomy  05/17/2012    Procedure: PARTIAL COLECTOMY;  Surgeon: Odis Hollingshead, MD;  Location: [facility];  Service: General;  Laterality: N/A;  . Colostomy closure  05/17/2012    Procedure: COLOSTOMY CLOSURE;  Surgeon: Odis Hollingshead, MD;  Location: [facility];  Service: General;  Laterality: N/A;  . Laparotomy  05/17/2012      Procedure: EXPLORATORY LAPAROTOMY;  Surgeon: Odis Hollingshead, MD;  Location: [facility];  Service: General;;  . Lysis of adhesion  05/17/2012    Procedure: LYSIS OF ADHESION;  Surgeon: Odis Hollingshead, MD;  Location: [facility];  Service: General;;  . Esophageal biopsy  05/17/2012    Procedure: BIOPSY;  Surgeon: Odis Hollingshead, MD;  Location: [facility];  Service: General;;  omental biopsy  . Laparotomy  05/22/2012    Procedure: EXPLORATORY LAPAROTOMY;  Surgeon: Odis Hollingshead, MD;  Location: [facility];  Service: General;  Laterality: N/A;  DRAINAGE  INTRA-ABDOMINAL ABSCESS/LYSIS OF ADHESIONSFOR SMALL BOWEL OBSTRUCTION/DIVERTING LOOP ILEOSTOMY   Social History:  reports that she has been smoking Cigarettes.  She has a 5 pack-year smoking history. She has never used smokeless tobacco. She reports that she does not drink alcohol or use illicit drugs.  Allergies  Allergen Reactions  . Vancomycin Hives and Rash    wheezing  . Ativan [Lorazepam] Other (See Comments)    "makes me crazy"  . Codeine Nausea And Vomiting  . Tetanus Toxoids Other (See Comments)    Serum reaction  . Penicillins Hives and Rash  . Xarelto [Rivaroxaban] Hives and Rash    (May not be allergic. Vancomycin was also being taken when reaction occurred.)    Family History  Problem Relation Age of Onset  . Stroke Father   . Atrial fibrillation Father   . Hypertension Mother      Prior to Admission medications   Medication Sig Start Date End Date Taking? Authorizing Provider  albuterol (PROVENTIL HFA;VENTOLIN HFA) 108 (90 BASE) MCG/ACT inhaler Inhale 1-2 puffs into the lungs every 6 (six) hours as needed for wheezing or shortness of breath.    Yes Historical Provider, MD  aspirin 325 MG EC tablet Take 325 mg by mouth every morning.    Yes Historical Provider, MD  bisoprolol (ZEBETA) 5 MG tablet Take 5 mg by mouth every morning.   Yes Historical Provider, MD  buPROPion (WELLBUTRIN XL) 300 MG 24 hr tablet Take 300 mg by mouth  daily before breakfast.    Yes Historical Provider, MD  celecoxib (CELEBREX) 200 MG capsule Take 200 mg by mouth every morning.    Yes Historical Provider, MD  ciprofloxacin-hydrocortisone (CIPRO HC OTIC) otic suspension Place 3 drops into the right ear 2 (two) times daily. 09/26/13 10/17/13 Yes Historical Provider, MD  cyclobenzaprine (FLEXERIL) 10 MG tablet Take 10 mg by mouth 3 (three) times daily as needed for muscle spasms.  07/12/13  Yes Historical Provider, MD  diphenoxylate-atropine (LOMOTIL) 2.5-0.025 MG per tablet Take 1 tablet by mouth at bedtime.   Yes Historical Provider, MD  esomeprazole (NEXIUM) 40 MG capsule Take 40 mg by mouth every morning.    Yes Historical Provider, MD  estradiol (ESTRACE) 2 MG tablet Take 2 mg by mouth daily before breakfast.    Yes Historical Provider, MD  furosemide (LASIX) 40 MG  tablet Take 40 mg by mouth daily as needed for fluid or edema.  12/24/12  Yes Historical Provider, MD  guaiFENesin (MUCINEX) 600 MG 12 hr tablet Take 600 mg by mouth 2 (two) times daily as needed for cough or to loosen phlegm.   Yes Historical Provider, MD  HYDROcodone-acetaminophen (NORCO) 10-325 MG per tablet Take 1 tablet by mouth every 4 (four) hours as needed for moderate pain.  07/13/13  Yes Historical Provider, MD  loperamide (IMODIUM) 2 MG capsule Take 2 mg by mouth 4 (four) times daily.    Yes Historical Provider, MD  loratadine-pseudoephedrine (CLARITIN-D 12-HOUR) 5-120 MG per tablet Take 1 tablet by mouth daily as needed for allergies.    Yes Historical Provider, MD  ondansetron (ZOFRAN) 4 MG tablet Take 4 mg by mouth every 8 (eight) hours as needed for nausea or vomiting.   Yes Historical Provider, MD   Physical Exam: Filed Vitals:   10/15/13 1525  BP: 91/63  Pulse:   Temp: 97.4 F (36.3 C)  Resp: 20    BP 91/63  Pulse 42  Temp(Src) 97.4 F (36.3 C) (Oral)  Resp 20  Ht 5\' 5"  (1.651 m)  Wt 53.524 kg (118 lb)  BMI 19.64 kg/m2  SpO2 81%  General:  Appears calm and  comfortable Eyes: PERRL, normal lids, irises & conjunctiva ENT: grossly normal hearing, lips & tongue Neck: no LAD, masses or thyromegaly Cardiovascular: Irregularly irregular rate controlled, no m/r/g. No LE edema. Respiratory: CTA bilaterally, no w/r/r. Normal respiratory effort. Abdomen: soft, nt, nd, ostomy pink and intact with no leakage through bag or seal Skin: no rash or induration seen on limited exam Musculoskeletal: grossly normal tone BUE/BLE Psychiatric: grossly normal mood and affect, speech fluent and appropriate Neurologic: grossly non-focal.          Labs on Admission:  Basic Metabolic Panel:  Recent Labs Lab 10/15/13 1154  NA 128*  K 4.7  CL 100  CO2 13*  GLUCOSE 110*  BUN 17  CREATININE 1.29*  CALCIUM 6.0*   Liver Function Tests:  Recent Labs Lab 10/15/13 1154  AST 21  ALT 14  ALKPHOS 551*  BILITOT 0.4  PROT 7.2  ALBUMIN 2.7*   No results found for this basename: LIPASE, AMYLASE,  in the last 168 hours No results found for this basename: AMMONIA,  in the last 168 hours CBC:  Recent Labs Lab 10/15/13 1154  WBC 11.9*  NEUTROABS 6.9  HGB 8.0*  HCT 23.9*  MCV 91.9  PLT 317   Cardiac Enzymes: No results found for this basename: CKTOTAL, CKMB, CKMBINDEX, TROPONINI,  in the last 168 hours  BNP (last 3 results)  Recent Labs  03/24/13 2031 03/25/13 0334 03/26/13 0414  PROBNP 9254.0* 9403.0* 4535.0*   CBG: No results found for this basename: GLUCAP,  in the last 168 hours  Radiological Exams on Admission: Dg Ribs Unilateral W/chest Left  10/15/2013   CLINICAL DATA:  Dizziness and hypotension and recent fall now with mid anterior left rib discomfort  EXAM: LEFT RIBS AND CHEST - 3+ VIEW  COMPARISON:  [facility] chest x-ray of Aug 22, 2013.  FINDINGS: The lungs are adequately inflated. There is no pneumothorax, pleural effusion, or pulmonary contusion. The patchy bilateral airspace opacification has become more conspicuous. The heart is top-normal  in size. There is stable tortuosity of the descending thoracic aorta. The pulmonary vascularity is not engorged. The PICC line has been removed. The rib detail views reveal no acute fractures.  IMPRESSION:  1. There is no acute rib fracture nor evidence of other acute post traumatic injury. 2. Persistent bilateral pulmonary airspace opacification.   Electronically Signed   By: David  Martinique   On: 10/15/2013 13:05    Assessment/Plan Active Problems:   Dehydration - Most likely 2ary to combination of lasix administration and concurrent high GI output from ostomy - Place on IVF's and reassess next am.    Atrial fibrillation - Given soft blood pressures will continue B blocker with holding parameters.    Ischemic colitis sp Ileostomy status - Pt to f/u with general surgeon for routine evaluation after discharge - Involving high output through ostomy. Pt currently on loperamide and lomotil    Hypocalcemia - replace with calclium gluconate x 1 time. - reassess next am.    Metabolic acidosis - Most likely from high GI output as patient states that she has 1-1.5L output through her ostomy daily. - Place on IVF's and reassess next am  Anemia - Anemia panel - most likely related to absorption abnormalities given history but will reassess next am. Transfuse if hgb less than 8.0 given cardiac history.  DVT prophylaxis - SCD's  Code Status: full Family Communication: discussed with patient and family at bedside. Disposition Plan: pending improvement in metabolic abnormalities   Time spent: > 55 minutes  Velvet Bathe [facility] Pager [phone]  **Disclaimer: This note may have been dictated with voice recognition software. Similar sounding words can inadvertently be transcribed and this note may contain transcription errors which may not have been corrected upon publication of note.**

## 2013-10-15 NOTE — ED Provider Notes (Signed)
CSN: [id number]     Arrival date & time 10/15/13  1103 History   First MD Initiated Contact with Patient 10/15/13 1119     Chief Complaint  Patient presents with  . Hypotension     (Consider location/radiation/quality/duration/timing/severity/associated sxs/prior Treatment) HPI  63 year old female a complex past medical history, presenting at the direction of her general surgeon, after she went for a regular followup office visit. At that visit her blood pressure was noted to be low. She complains of recent increasing lightheadedness and fatigue. 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 She has required transfusions in the past he anemia. Her last transfusion was 4 weeks ago. She endorses recent increased dyspnea on exertion. She was recently treated for bilateral pneumonia in May and June. She denies any chest pain or vomiting, also denies fevers. She's been able to eat and drink. Her ostomy output has been 1 to 1.5 L per day, which she states is normal for her. She endorses significant fatigue. She's also noticed some suprapubic pressure, but no dysuria.  Past Medical History  Diagnosis Date  . Hypertension     She has a past hx of essential  . 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 Inflammation of joint of knee     Since we last last saw her she developed problems with an acute which required surgical drainage by her orthopedist Dr. Durward Fortes.  Marland Kitchen MRSA (methicillin resistant Staphylococcus aureus)     Knee surgery drainage was positive for MRSA and she was treated with 3 weeks of doxycycline successfully.  . Diarrhea     Mild  . Exogenous obesity   . GERD (gastroesophageal reflux disease)     2 hosp.- ischemic colitis - residual of Norovirus, 05/2011- sm. bowel obstruction  . Headache(784.0)     migraine headache on occas, less now than when she was younger   .  Arthritis     L hip, back, neck   . History of blood transfusion sept 2013    04/2011- /w ischemic colitis , trouble with matching blood  sept 2013  . Anemia     will see hematology consult prior to surgery, recommended by Dr. Mare Ferrari  . Anemia 12/15/2010  . Ischemic colitis 01/31/2012  . Atrial flutter     during hospitalization, 04/2011- related to anemia & illness/stress   . Pneumonia     04/2011- not hospitalized , pt. denies SOB, changes in chest, breathing  . 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 Dizziness     occasional  . B12 deficiency 02/27/2013  . 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 COPD (chronic obstructive pulmonary disease) 09/26/2013   Past Surgical History  Procedure Laterality Date  . Knee surgery      I&D- 2008, post laceration   . Colonoscopy  05/16/2011    Procedure: COLONOSCOPY;  Surgeon: Winfield Cunas., MD;  Location: [facility];  Service: Endoscopy;  Laterality: N/A;  . Small intestine surgery  1992, 1999  . Laparotomy and lysis of adhesions    . Total hip arthroplasty  10/25/2011    Procedure: TOTAL HIP ARTHROPLASTY;  Surgeon: Garald Balding, MD;  Location: [facility];  Service: Orthopedics;  Laterality: Left;  . Appendectomy  1962  . Abdominal hysterectomy  1988  . Partial colectomy and colostomy  sept 2013    mucous fistula done  .  Partial colectomy  05/17/2012    Procedure: PARTIAL COLECTOMY;  Surgeon: Odis Hollingshead, MD;  Location: [facility];  Service: General;  Laterality: N/A;  . Colostomy closure  05/17/2012    Procedure: COLOSTOMY CLOSURE;  Surgeon: Odis Hollingshead, MD;  Location: [facility];  Service: General;  Laterality: N/A;  . Laparotomy  05/17/2012    Procedure: EXPLORATORY LAPAROTOMY;  Surgeon: Odis Hollingshead, MD;  Location: [facility];  Service: General;;  . Lysis of adhesion  05/17/2012    Procedure: LYSIS OF ADHESION;  Surgeon: Odis Hollingshead, MD;  Location: [facility];  Service: General;;  . Esophageal biopsy  05/17/2012    Procedure:  BIOPSY;  Surgeon: Odis Hollingshead, MD;  Location: [facility];  Service: General;;  omental biopsy  . Laparotomy  05/22/2012    Procedure: EXPLORATORY LAPAROTOMY;  Surgeon: Odis Hollingshead, MD;  Location: [facility];  Service: General;  Laterality: N/A;  DRAINAGE  INTRA-ABDOMINAL ABSCESS/LYSIS OF ADHESIONSFOR SMALL BOWEL OBSTRUCTION/DIVERTING LOOP ILEOSTOMY   Family History  Problem Relation Age of Onset  . Stroke Father   . Atrial fibrillation Father   . Hypertension Mother    History  Substance Use Topics  . Smoking status: Current Every Day Smoker -- 0.25 packs/day for 20 years    Types: Cigarettes    Last Attempt to Quit: 03/21/2013  . Smokeless tobacco: Never Used  . Alcohol Use: No     Comment: 1 - 2 glasses of wine per week   OB History   Grav Para Term Preterm Abortions TAB SAB Ect Mult Living                 Review of Systems  Constitutional: Positive for fatigue. Negative for fever.  Respiratory: Positive for shortness of breath.   Cardiovascular: Negative for chest pain.  Gastrointestinal: Negative for abdominal pain.  Genitourinary: Negative for dysuria.  All other systems reviewed and are negative.     Allergies  Vancomycin; Ativan; Codeine; Tetanus toxoids; Penicillins; and Xarelto  Home Medications   Prior to Admission medications   Medication Sig Start Date End Date Taking? Authorizing Provider  albuterol (PROVENTIL HFA;VENTOLIN HFA) 108 (90 BASE) MCG/ACT inhaler Inhale 1-2 puffs into the lungs every 6 (six) hours as needed for wheezing or shortness of breath.    Yes Historical Provider, MD  aspirin 325 MG EC tablet Take 325 mg by mouth every morning.    Yes Historical Provider, MD  bisoprolol (ZEBETA) 5 MG tablet Take 5 mg by mouth every morning.   Yes Historical Provider, MD  buPROPion (WELLBUTRIN XL) 300 MG 24 hr tablet Take 300 mg by mouth daily before breakfast.    Yes Historical Provider, MD  celecoxib (CELEBREX) 200 MG capsule Take 200 mg by mouth every  morning.    Yes Historical Provider, MD  ciprofloxacin-hydrocortisone (CIPRO HC OTIC) otic suspension Place 3 drops into the right ear 2 (two) times daily. 09/26/13 10/17/13 Yes Historical Provider, MD  cyclobenzaprine (FLEXERIL) 10 MG tablet Take 10 mg by mouth 3 (three) times daily as needed for muscle spasms.  07/12/13  Yes Historical Provider, MD  diphenoxylate-atropine (LOMOTIL) 2.5-0.025 MG per tablet Take 1 tablet by mouth at bedtime.   Yes Historical Provider, MD  esomeprazole (NEXIUM) 40 MG capsule Take 40 mg by mouth every morning.    Yes Historical Provider, MD  estradiol (ESTRACE) 2 MG tablet Take 2 mg by mouth daily before breakfast.    Yes Historical Provider, MD  furosemide (LASIX)  40 MG tablet Take 40 mg by mouth daily as needed for fluid or edema.  12/24/12  Yes Historical Provider, MD  guaiFENesin (MUCINEX) 600 MG 12 hr tablet Take 600 mg by mouth 2 (two) times daily as needed for cough or to loosen phlegm.   Yes Historical Provider, MD  HYDROcodone-acetaminophen (NORCO) 10-325 MG per tablet Take 1 tablet by mouth every 4 (four) hours as needed for moderate pain.  07/13/13  Yes Historical Provider, MD  loperamide (IMODIUM) 2 MG capsule Take 2 mg by mouth 4 (four) times daily.    Yes Historical Provider, MD  loratadine-pseudoephedrine (CLARITIN-D 12-HOUR) 5-120 MG per tablet Take 1 tablet by mouth daily as needed for allergies.    Yes Historical Provider, MD  ondansetron (ZOFRAN) 4 MG tablet Take 4 mg by mouth every 8 (eight) hours as needed for nausea or vomiting.   Yes Historical Provider, MD   BP 103/74  Pulse 95  Resp 21  SpO2 80% Physical Exam  Constitutional: She is oriented to person, place, and time. She appears well-developed and well-nourished. No distress.  HENT:  Head: Normocephalic and atraumatic.  Dry mucous membranes  Eyes: Pupils are equal, round, and reactive to light. Right eye exhibits no discharge. Left eye exhibits no discharge.  Cardiovascular: Regular rhythm  and normal heart sounds.   No murmur heard. Pulmonary/Chest: Effort normal and breath sounds normal. No respiratory distress. She has no wheezes. She has no rales.  Abdominal: Soft. Bowel sounds are normal. She exhibits no distension. There is no tenderness. There is no rebound and no guarding.  Musculoskeletal: Normal range of motion. She exhibits no edema and no tenderness.  Neurological: She is alert and oriented to person, place, and time.  Skin: Skin is warm and dry. She is not diaphoretic.  Psychiatric: She has a normal mood and affect. Her behavior is normal.    ED Course  Procedures (including critical care time) Labs Review Labs Reviewed  CBC WITH DIFFERENTIAL - Abnormal; Notable for the following:    WBC 11.9 (*)    RBC 2.60 (*)    Hemoglobin 8.0 (*)    HCT 23.9 (*)    RDW 18.7 (*)    Monocytes Relative 22 (*)    Monocytes Absolute 2.6 (*)    All other components within normal limits  COMPREHENSIVE METABOLIC PANEL - Abnormal; Notable for the following:    Sodium 128 (*)    CO2 13 (*)    Glucose, Bld 110 (*)    Creatinine, Ser 1.29 (*)    Calcium 6.0 (*)    Albumin 2.7 (*)    Alkaline Phosphatase 551 (*)    GFR calc non Af Amer 43 (*)    GFR calc Af Amer 50 (*)    All other components within normal limits  URINALYSIS, ROUTINE W REFLEX MICROSCOPIC    Imaging Review Dg Ribs Unilateral W/chest Left  10/15/2013   CLINICAL DATA:  Dizziness and hypotension and recent fall now with mid anterior left rib discomfort  EXAM: LEFT RIBS AND CHEST - 3+ VIEW  COMPARISON:  [facility] chest x-ray of Aug 22, 2013.  FINDINGS: The lungs are adequately inflated. There is no pneumothorax, pleural effusion, or pulmonary contusion. The patchy bilateral airspace opacification has become more conspicuous. The heart is top-normal in size. There is stable tortuosity of the descending thoracic aorta. The pulmonary vascularity is not engorged. The PICC line has been removed. The rib detail views reveal no  acute fractures.  IMPRESSION:  1. There is no acute rib fracture nor evidence of other acute post traumatic injury. 2. Persistent bilateral pulmonary airspace opacification.   Electronically Signed   By: David  Martinique   On: 10/15/2013 13:05     EKG Interpretation None      MDM   Final diagnoses:  None    63 year old female a complex past medical history, presenting with increased dyspnea on exertion, fatigue, and hypertension. She's been bolused here in the emergency room with resultant improvement in her blood pressure. Her labs are significant for mild acute kidney injury, acidosis, and hypocalcemia. She is dehydrated. She will require admission to the hospital for further IV hydration, and correction of her lab abnormalities. Spoke with hospitalist Dr. Wendee Beavers who is going to see the patient and admit her.  Chrisandra Netters, MD Family Medicine PGY-2     Leeanne Rio, MD 10/15/13 [phone]

## 2013-10-16 DIAGNOSIS — C9210 Chronic myeloid leukemia, BCR/ABL-positive, not having achieved remission: Secondary | ICD-10-CM

## 2013-10-16 DIAGNOSIS — E871 Hypo-osmolality and hyponatremia: Secondary | ICD-10-CM

## 2013-10-16 DIAGNOSIS — E860 Dehydration: Secondary | ICD-10-CM

## 2013-10-16 LAB — COMPREHENSIVE METABOLIC PANEL
ALBUMIN: 2.4 g/dL — AB (ref 3.5–5.2)
ALT: 12 U/L (ref 0–35)
AST: 14 U/L (ref 0–37)
Alkaline Phosphatase: 493 U/L — ABNORMAL HIGH (ref 39–117)
BUN: 16 mg/dL (ref 6–23)
CALCIUM: 6.1 mg/dL — AB (ref 8.4–10.5)
CO2: 13 mEq/L — ABNORMAL LOW (ref 19–32)
Chloride: 104 mEq/L (ref 96–112)
Creatinine, Ser: 1.01 mg/dL (ref 0.50–1.10)
GFR calc Af Amer: 67 mL/min — ABNORMAL LOW (ref >90)
GFR calc non Af Amer: 58 mL/min — ABNORMAL LOW (ref >90)
Glucose, Bld: 135 mg/dL — ABNORMAL HIGH (ref 70–99)
Potassium: 4.1 mEq/L (ref 3.7–5.3)
SODIUM: 131 meq/L — AB (ref 137–147)
TOTAL PROTEIN: 6.2 g/dL (ref 6.0–8.3)
Total Bilirubin: 0.3 mg/dL (ref 0.3–1.2)

## 2013-10-16 LAB — MAGNESIUM: MAGNESIUM: 1.9 mg/dL (ref 1.5–2.5)

## 2013-10-16 LAB — URINALYSIS, ROUTINE W REFLEX MICROSCOPIC
BILIRUBIN URINE: NEGATIVE
Glucose, UA: NEGATIVE mg/dL
Ketones, ur: NEGATIVE mg/dL
NITRITE: POSITIVE — AB
PROTEIN: NEGATIVE mg/dL
SPECIFIC GRAVITY, URINE: 1.009 (ref 1.005–1.030)
UROBILINOGEN UA: 0.2 mg/dL (ref 0.0–1.0)
pH: 6 (ref 5.0–8.0)

## 2013-10-16 LAB — URINE MICROSCOPIC-ADD ON

## 2013-10-16 LAB — GLUCOSE, CAPILLARY: Glucose-Capillary: 110 mg/dL — ABNORMAL HIGH (ref 70–99)

## 2013-10-16 LAB — PREPARE RBC (CROSSMATCH)

## 2013-10-16 MED ORDER — SODIUM BICARBONATE 8.4 % IV SOLN
INTRAVENOUS | Status: DC
  Administered 2013-10-16 – 2013-10-17 (×2): via INTRAVENOUS
  Filled 2013-10-16 (×5): qty 1000

## 2013-10-16 MED ORDER — DEXTROSE 5 % IV SOLN
1.0000 g | INTRAVENOUS | Status: DC
  Administered 2013-10-16 – 2013-10-18 (×3): 1 g via INTRAVENOUS
  Filled 2013-10-16 (×3): qty 10

## 2013-10-16 MED ORDER — SILVER NITRATE-POT NITRATE 75-25 % EX MISC
1.0000 "application " | CUTANEOUS | Status: DC | PRN
  Filled 2013-10-16 (×10): qty 1

## 2013-10-16 MED ORDER — SODIUM CHLORIDE 0.9 % IV SOLN
1.0000 g | Freq: Once | INTRAVENOUS | Status: AC
  Administered 2013-10-16: 1 g via INTRAVENOUS
  Filled 2013-10-16: qty 10

## 2013-10-16 MED ORDER — MAGNESIUM SULFATE IN D5W 10-5 MG/ML-% IV SOLN
1.0000 g | Freq: Once | INTRAVENOUS | Status: AC
  Administered 2013-10-16: 1 g via INTRAVENOUS
  Filled 2013-10-16: qty 100

## 2013-10-16 NOTE — Progress Notes (Signed)
Patient's UA came back with many bacteria in it. NP oncall notified. New orders given for IV antibiotics.

## 2013-10-16 NOTE — Progress Notes (Addendum)
[facility] PROGRESS NOTE  [name] KVQ:[id number] DOB: 1950-07-12 DOA: 10/15/2013 PCP: Darlin Coco, MD   Brief narrative 63 year old female with history of CMML, ischemic colitis status post partial resection of colon followed by the ostomy with high output , A. fib, and anemia requiring frequent transfusions as outpatient was sent to the ED by her surgeon as she was found to have significant metabolic abnormalities on labs drawn during routine outpatient visit. Patient reports feeling generalized weakness and nausea for past 2 days. In the ED patient was found to be tachycardic and hypotensive which is resolved with IV fluids. Labs done showed significant hypocalcemia with mild elevation of creatinine of 1.29. She was found to be anemic with hemoglobin of 8 (baseline hemoglobin around 8.6-8.8), hyponatremic with sodium of 643 and metabolic acidosis with bicarbonate of 13 and anion gap of 15. Her magnesium was low at 0.8. The patient admitted to hospitalist service for further management. She was given 1 g of calcium gluconate and 4 g magnesium sulfate.   Assessment/Plan: Dehydration with  metabolic acidosis Likely combination of high ostomy output and concurrent dose of Lasix. Patient started on IV fluids. Clinic and we'll calcium with IV calcium gluconate. (Corrected calcium of 7 .4 this am). -replenished low  magnesium. 1.9 this morning. -Patient reports symptomatic improvement. Will obtain PT eval -Still has a low bicarbonate of 13. place her on D5 with collapse of bicarbonate and monitor. Sodium improving  Hypocalcemia As above. Replenishing with IV calcium gluconate. Recheck in morning. low magnesium replenished  Hyponatremia Slowly improving with IV hydration. Monitor  Acute kidney injury Secondary to dehydration and Lasix. Resolved with IV fluids  UTI As noted on UA. Followup culture. Started on IV Rocephin empirically  Metabolic acidosis Monitor with IV  bicarbonate  Anemia of chronic disease Secondary to CMML. Iron panel and B12 level reviewed. Follows with Dr. Alvy Bimler and gets transfusion Periodically.  last transfusion was 3 weeks ago for hemoglobin of 6.8. I will transfuse her with anemia PRBCs and monitor. Patient reports being considered for Epogen as outpt  ileostomy status Monitor output. Seen by wound care  Nurse. Notified pts admission to her surgeon Dr Zella Richer  A. fib In sinus rhythm. Continue full dose aspirin  DVT prophylaxis: SCDs Diet: Regular  Code Status: Full code Family Communication: None at bedside Disposition Plan: Home Once improved   Consultants:  None. Called Dr Zella Richer and left a message  Procedures:  None  Antibiotics:  None  HPI/Subjective: Patient seen and examined this morning. Reports feeling much better today and feels last week her feet.  Objective: Filed Vitals:   10/16/13 1412  BP: 119/62  Pulse: 97  Temp: 97.7 F (36.5 C)  Resp: 18    Intake/Output Summary (Last 24 hours) at 10/16/13 1516 Last data filed at 10/16/13 1413  Gross per 24 hour  Intake 2223.5 ml  Output   1975 ml  Net  248.5 ml   Filed Weights   10/15/13 1525  Weight: 53.524 kg (118 lb)    Exam:   General:  Elderly female lying in bed in no acute distress, appears pale  HEENT: Pallor present, moist oral mucosa,  Cardiovascular: Normal S1 and S2, normal no gallop  Respiratory: Diminished breath sounds over lung bases, no crackles or rhonchi  Abdomen: Soft, nondistended, ileostomy with clear output, nontender, bowel sounds present  Musculoskeletal: Warm, no edema  CNS: AAO x3   Data Reviewed: Basic Metabolic Panel:  Recent Labs Lab 10/15/13 1154 10/16/13 0451  NA 128* 131*  K 4.7 4.1  CL 100 104  CO2 13* 13*  GLUCOSE 110* 135*  BUN 17 16  CREATININE 1.29* 1.01  CALCIUM 6.0* 6.1*  MG 0.8* 1.9  PHOS 3.1  --    Liver Function Tests:  Recent Labs Lab 10/15/13 1154  10/16/13 0451  AST 21 14  ALT 14 12  ALKPHOS 551* 493*  BILITOT 0.4 0.3  PROT 7.2 6.2  ALBUMIN 2.7* 2.4*   No results found for this basename: LIPASE, AMYLASE,  in the last 168 hours No results found for this basename: AMMONIA,  in the last 168 hours CBC:  Recent Labs Lab 10/15/13 1154  WBC 11.9*  NEUTROABS 6.9  HGB 8.0*  HCT 23.9*  MCV 91.9  PLT 317   Cardiac Enzymes: No results found for this basename: CKTOTAL, CKMB, CKMBINDEX, TROPONINI,  in the last 168 hours BNP (last 3 results)  Recent Labs  03/24/13 2031 03/25/13 0334 03/26/13 0414  PROBNP 9254.0* 9403.0* 4535.0*   CBG:  Recent Labs Lab 10/16/13 0712  GLUCAP 110*    Recent Results (from the past 240 hour(s))  MRSA PCR SCREENING     Status: Abnormal   Collection Time    10/15/13  8:37 PM      Result Value Ref Range Status   MRSA by PCR POSITIVE (*) NEGATIVE Final   Comment:            The GeneXpert MRSA Assay (FDA     approved for NASAL specimens     only), is one component of a     comprehensive MRSA colonization     surveillance program. It is not     intended to diagnose MRSA     infection nor to guide or     monitor treatment for     MRSA infections.     RESULT CALLED TO, READ BACK BY AND VERIFIED WITH:     L.HUDSON AT 2325 ON 10/15/13 BY [facility]     Studies: Dg Ribs Unilateral W/chest Left  10/15/2013   CLINICAL DATA:  Dizziness and hypotension and recent fall now with mid anterior left rib discomfort  EXAM: LEFT RIBS AND CHEST - 3+ VIEW  COMPARISON:  [facility] chest x-ray of Aug 22, 2013.  FINDINGS: The lungs are adequately inflated. There is no pneumothorax, pleural effusion, or pulmonary contusion. The patchy bilateral airspace opacification has become more conspicuous. The heart is top-normal in size. There is stable tortuosity of the descending thoracic aorta. The pulmonary vascularity is not engorged. The PICC line has been removed. The rib detail views reveal no acute fractures.  IMPRESSION: 1.  There is no acute rib fracture nor evidence of other acute post traumatic injury. 2. Persistent bilateral pulmonary airspace opacification.   Electronically Signed   By: David  Martinique   On: 10/15/2013 13:05    Scheduled Meds: . aspirin  325 mg Oral q morning - 10a  . bisoprolol  5 mg Oral q morning - 10a  . buPROPion  300 mg Oral QAC breakfast  . cefTRIAXone (ROCEPHIN)  IV  1 g Intravenous Q24H  . celecoxib  200 mg Oral q morning - 10a  . Chlorhexidine Gluconate Cloth  6 each Topical Q0600  . ciprofloxacin-hydrocortisone  3 drop Both Ears BID  . diphenoxylate-atropine  1 tablet Oral QHS  . estradiol  2 mg Oral QAC breakfast  . loperamide  2 mg Oral QID  . mupirocin ointment  1 application Nasal BID  .  nicotine  14 mg Transdermal Daily  . pantoprazole  40 mg Oral Daily   Continuous Infusions: . dextrose 5 % 1,000 mL with sodium bicarbonate 100 mEq infusion 100 mL/hr at 10/16/13 1121      Time spent: 25 minutes    [name], Ranger  [facility] Pager [phone]. If 7PM-7AM, please contact night-coverage at [email or web address], password [email or web address] 10/16/2013, 3:16 PM  LOS: 1 day

## 2013-10-16 NOTE — Progress Notes (Signed)
CRITICAL VALUE ALERT  Critical value received: calcium 6.1  Date of notification:  10/16/13  Time of notification:  0603  Critical value read back:Yes.    Nurse who received alert:  Reynold Bowen, RN  MD notified (1st page): Chaney Malling, NP  Time of first page: [phone]  MD notified (2nd page):  Time of second page:  Responding MD:  Chaney Malling, NP  Time MD responded:  [phone]  NP put in new orders into computer. Will continue to monitor.

## 2013-10-16 NOTE — Consult Note (Signed)
WOC wound consult note Reason for Consult:abdominal wound.  Patient is self care and well known to me.  She has asked that I replenish her supply of silver nitrate sticks and I am happy to do so for her use during this admission and to replace those from home that she has been using.  I have provided her with additional ostomy pouching supplies for her RLQ ileostomy  Kellie Simmering [phone]) and will provide her with mesh briefs as her own underwear is getting soiled with pouch emptying and dressing changes.  Patient has declined my offer to assist her with a dressing change as she has just performed wound care. [facility] nursing team will not follow, but will remain available to this patient, the nursing and medical and surgical teams.  Please re-consult if needed. Thanks, Maudie Flakes, MSN, RN, [place], [place], [place] [phone])

## 2013-10-17 ENCOUNTER — Ambulatory Visit (HOSPITAL_COMMUNITY)
Admit: 2013-10-17 | Discharge: 2013-10-17 | Disposition: A | Discharge status: Home / Self Care | Payer: BC Managed Care – PPO | Attending: Hematology and Oncology | Admitting: Hematology and Oncology

## 2013-10-17 DIAGNOSIS — D638 Anemia in other chronic diseases classified elsewhere: Secondary | ICD-10-CM

## 2013-10-17 DIAGNOSIS — S31109A Unspecified open wound of abdominal wall, unspecified quadrant without penetration into peritoneal cavity, initial encounter: Secondary | ICD-10-CM

## 2013-10-17 DIAGNOSIS — Z932 Ileostomy status: Secondary | ICD-10-CM

## 2013-10-17 DIAGNOSIS — E860 Dehydration: Secondary | ICD-10-CM

## 2013-10-17 LAB — CBC
HCT: 23.8 % — ABNORMAL LOW (ref 36.0–46.0)
Hemoglobin: 8.1 g/dL — ABNORMAL LOW (ref 12.0–15.0)
MCH: 30.7 pg (ref 26.0–34.0)
MCHC: 34 g/dL (ref 30.0–36.0)
MCV: 90.2 fL (ref 78.0–100.0)
Platelets: 225 10*3/uL (ref 150–400)
RBC: 2.64 MIL/uL — AB (ref 3.87–5.11)
RDW: 18.9 % — ABNORMAL HIGH (ref 11.5–15.5)
WBC: 6.3 10*3/uL (ref 4.0–10.5)

## 2013-10-17 LAB — BASIC METABOLIC PANEL
Anion gap: 11 (ref 5–15)
BUN: 13 mg/dL (ref 6–23)
CHLORIDE: 103 meq/L (ref 96–112)
CO2: 18 meq/L — AB (ref 19–32)
CREATININE: 0.85 mg/dL (ref 0.50–1.10)
Calcium: 6.1 mg/dL — CL (ref 8.4–10.5)
GFR calc non Af Amer: 71 mL/min — ABNORMAL LOW (ref >90)
GFR, EST AFRICAN AMERICAN: 82 mL/min — AB (ref >90)
Glucose, Bld: 106 mg/dL — ABNORMAL HIGH (ref 70–99)
Potassium: 4.1 mEq/L (ref 3.7–5.3)
SODIUM: 132 meq/L — AB (ref 137–147)

## 2013-10-17 LAB — PREPARE RBC (CROSSMATCH)

## 2013-10-17 MED ORDER — CALCIUM GLUCONATE 10 % IV SOLN
1.0000 g | Freq: Once | INTRAVENOUS | Status: AC
Start: 2013-10-17 — End: 2013-10-17
  Administered 2013-10-17: 1 g via INTRAVENOUS
  Filled 2013-10-17: qty 10

## 2013-10-17 MED ORDER — VITAMIN B-12 1000 MCG PO TABS
1000.0000 ug | ORAL_TABLET | Freq: Every day | ORAL | Status: DC
  Administered 2013-10-17 – 2013-10-18 (×2): 1000 ug via ORAL
  Filled 2013-10-17 (×2): qty 1

## 2013-10-17 MED ORDER — DIPHENOXYLATE-ATROPINE 2.5-0.025 MG PO TABS
1.0000 | ORAL_TABLET | Freq: Four times a day (QID) | ORAL | Status: DC
  Administered 2013-10-17 – 2013-10-18 (×5): 1 via ORAL
  Filled 2013-10-17 (×5): qty 1

## 2013-10-17 MED ORDER — SODIUM BICARBONATE 650 MG PO TABS
650.0000 mg | ORAL_TABLET | Freq: Three times a day (TID) | ORAL | Status: DC
Start: 2013-10-17 — End: 2013-10-18
  Administered 2013-10-17 – 2013-10-18 (×4): 650 mg via ORAL
  Filled 2013-10-17 (×6): qty 1

## 2013-10-17 MED ORDER — SODIUM CHLORIDE 0.9 % IV SOLN
INTRAVENOUS | Status: DC
  Administered 2013-10-17: 100 mL/h via INTRAVENOUS
  Administered 2013-10-18: 01:00:00 via INTRAVENOUS

## 2013-10-17 MED ORDER — FOLIC ACID 1 MG PO TABS
1.0000 mg | ORAL_TABLET | Freq: Every day | ORAL | Status: DC
  Administered 2013-10-17 – 2013-10-18 (×2): 1 mg via ORAL
  Filled 2013-10-17 (×2): qty 1

## 2013-10-17 NOTE — ED Provider Notes (Signed)
I saw and evaluated the patient, reviewed the resident's note and I agree with the findings and plan.  Pt w ileostomy, relatively high output at baseline. From gen surg office w gen weakness, dehydration.  Labs. Iv ns bolus. Admit.    Mirna Mires, MD 10/17/13 [phone]

## 2013-10-17 NOTE — Discharge Instructions (Signed)
Increase the salt in your diet.  Take 1-2 Lomotil tablets 30 minutes before meals and bedtime.  Take 2 Imodium tablets 30 minutes before meals and bedtime.  Take Tums EX 3 tablets three times a day.  Need to take Vitamin B12 and Folate daily.

## 2013-10-17 NOTE — Progress Notes (Signed)
CRITICAL VALUE ALERT  Critical value received:  Calcium 6.1  Date of notification:  10/17/2013  Time of notification:  0610  Critical value read back:Yes.    Nurse who received alert:  L. Vergel de Larkin Ina RN  MD notified (1st page):  Lamar Blinks NP  Time of first page:  [phone]  MD notified (2nd page):  Time of second page:  Responding MD:  Lamar Blinks NP  Time MD responded:  [phone]

## 2013-10-17 NOTE — Progress Notes (Signed)
Feels better.  Hydration improving.  Electrolyte anomalies improving except for hypocalcemia.  Has 1-1.5 liters of ileostomy output.  Has not been well enough nor healed completely so that she could have ileostomy reversal.  Would increase Lomotil, give folate and Vit B12 daily as well and increase the sodium in her diet.

## 2013-10-17 NOTE — Progress Notes (Signed)
[facility] PROGRESS NOTE  [name] RWE:[id number] DOB: 1950-07-11 DOA: 10/15/2013 PCP: Darlin Coco, MD   Brief narrative 63 year old female with history of CMML, ischemic colitis status post partial resection of colon followed by the ostomy with high output , A. fib, and anemia requiring frequent transfusions as outpatient was sent to the ED by her surgeon as she was found to have significant metabolic abnormalities on labs drawn during routine outpatient visit. Patient reports feeling generalized weakness and nausea for past 2 days. In the ED patient was found to be tachycardic and hypotensive which is resolved with IV fluids. Labs done showed significant hypocalcemia with mild elevation of creatinine of 1.29. She was found to be anemic with hemoglobin of 8 (baseline hemoglobin around 8.6-8.8), hyponatremic with sodium of 619 and metabolic acidosis with bicarbonate of 13 and anion gap of 15. Her magnesium was low at 0.8. The patient admitted to hospitalist service for further management. She was given 1 g of calcium gluconate and 4 g magnesium sulfate.   Assessment/Plan: Dehydration with  metabolic acidosis Likely combination of high ostomy output and concurrent dose of Lasix. Almost 2L ostomy output in 24 hrs. Patient started on IV fluids. IV ca gluconate 1 mg given x3 so far , ca still low at 6.1, will repat this afternoon. Low mg corrected.check vit D level -Patient reports symptomatic improvement. Will obtain PT eval low bicarbonate of 13, improved with bicarb drip to 18 today. Added sodium bicarb tablets Dr Zella Richer increased her dose of lomotil today and recommended adding daily b12 and folate..   Hypocalcemia As above. Replenishing with IV calcium gluconate. Recheck in morning. low magnesium replenished  Hyponatremia Slowly improving with IV hydration. Monitor  Acute kidney injury Secondary to dehydration and Lasix. Resolved with IV fluids  UTI As noted on UA.  Followup culture. Started on IV Rocephin empirically  Metabolic acidosis Improving with bicarb drip  Anemia of chronic disease Secondary to CMML. Iron panel and B12 level reviewed. Follows with Dr. Alvy Bimler and gets transfusion Periodically.  last transfusion was 3 weeks ago for hemoglobin of 6.8. Given 1 U PRBC with hb improved only to 8.1. Will given another 1u today. Check stool for occult blood. Patient reports being considered for Epogen as outpt  High output ileostomy status 1.9 L outpout past 24 hrs. Increased lomotil dose by surgery. Plan on outpt ileostomy reversal once clinically more stable.  A. fib In sinus rhythm. Continue full dose aspirin  DVT prophylaxis: SCDs Diet: Regular  Code Status: Full code Family Communication: None at bedside Disposition Plan: Home Once electrolytes improved   Consultants:   Dr Zella Richer   Procedures:  None  Antibiotics:  None  HPI/Subjective: Patient seen and examined this morning. feels better  Objective: Filed Vitals:   10/17/13 0611  BP: 116/74  Pulse: 83  Temp: 97.5 F (36.4 C)  Resp: 18    Intake/Output Summary (Last 24 hours) at 10/17/13 1205 Last data filed at 10/17/13 0900  Gross per 24 hour  Intake 2669.58 ml  Output   1300 ml  Net 1369.58 ml   Filed Weights   10/15/13 1525  Weight: 53.524 kg (118 lb)    Exam:   General:  Elderly female lying in bed in no acute distress, appears pale  HEENT: Pallor present, moist oral mucosa,  Cardiovascular: Normal S1 and S2, normal no gallop  Respiratory: Diminished breath sounds over lung bases, no crackles or rhonchi  Abdomen: Soft, nondistended, ileostomy with clear output, nontender, bowel  sounds present  Musculoskeletal: Warm, no edema  CNS: AAO x3   Data Reviewed: Basic Metabolic Panel:  Recent Labs Lab 10/15/13 1154 10/16/13 0451 10/17/13 0444  NA 128* 131* 132*  K 4.7 4.1 4.1  CL 100 104 103  CO2 13* 13* 18*  GLUCOSE 110* 135* 106*   BUN 17 16 13   CREATININE 1.29* 1.01 0.85  CALCIUM 6.0* 6.1* 6.1*  MG 0.8* 1.9  --   PHOS 3.1  --   --    Liver Function Tests:  Recent Labs Lab 10/15/13 1154 10/16/13 0451  AST 21 14  ALT 14 12  ALKPHOS 551* 493*  BILITOT 0.4 0.3  PROT 7.2 6.2  ALBUMIN 2.7* 2.4*   No results found for this basename: LIPASE, AMYLASE,  in the last 168 hours No results found for this basename: AMMONIA,  in the last 168 hours CBC:  Recent Labs Lab 10/15/13 1154 10/17/13 0444  WBC 11.9* 6.3  NEUTROABS 6.9  --   HGB 8.0* 8.1*  HCT 23.9* 23.8*  MCV 91.9 90.2  PLT 317 225   Cardiac Enzymes: No results found for this basename: CKTOTAL, CKMB, CKMBINDEX, TROPONINI,  in the last 168 hours BNP (last 3 results)  Recent Labs  03/24/13 2031 03/25/13 0334 03/26/13 0414  PROBNP 9254.0* 9403.0* 4535.0*   CBG:  Recent Labs Lab 10/16/13 0712  GLUCAP 110*    Recent Results (from the past 240 hour(s))  MRSA PCR SCREENING     Status: Abnormal   Collection Time    10/15/13  8:37 PM      Result Value Ref Range Status   MRSA by PCR POSITIVE (*) NEGATIVE Final   Comment:            The GeneXpert MRSA Assay (FDA     approved for NASAL specimens     only), is one component of a     comprehensive MRSA colonization     surveillance program. It is not     intended to diagnose MRSA     infection nor to guide or     monitor treatment for     MRSA infections.     RESULT CALLED TO, READ BACK BY AND VERIFIED WITH:     L.HUDSON AT 2325 ON 10/15/13 BY [facility]     Studies: Dg Ribs Unilateral W/chest Left  10/15/2013   CLINICAL DATA:  Dizziness and hypotension and recent fall now with mid anterior left rib discomfort  EXAM: LEFT RIBS AND CHEST - 3+ VIEW  COMPARISON:  [facility] chest x-ray of Aug 22, 2013.  FINDINGS: The lungs are adequately inflated. There is no pneumothorax, pleural effusion, or pulmonary contusion. The patchy bilateral airspace opacification has become more conspicuous. The heart is  top-normal in size. There is stable tortuosity of the descending thoracic aorta. The pulmonary vascularity is not engorged. The PICC line has been removed. The rib detail views reveal no acute fractures.  IMPRESSION: 1. There is no acute rib fracture nor evidence of other acute post traumatic injury. 2. Persistent bilateral pulmonary airspace opacification.   Electronically Signed   By: David  Martinique   On: 10/15/2013 13:05    Scheduled Meds: . aspirin  325 mg Oral q morning - 10a  . bisoprolol  5 mg Oral q morning - 10a  . buPROPion  300 mg Oral QAC breakfast  . cefTRIAXone (ROCEPHIN)  IV  1 g Intravenous Q24H  . celecoxib  200 mg Oral q morning - 10a  .  Chlorhexidine Gluconate Cloth  6 each Topical Q0600  . ciprofloxacin-hydrocortisone  3 drop Both Ears BID  . diphenoxylate-atropine  1 tablet Oral QID  . estradiol  2 mg Oral QAC breakfast  . folic acid  1 mg Oral Daily  . loperamide  2 mg Oral QID  . mupirocin ointment  1 application Nasal BID  . nicotine  14 mg Transdermal Daily  . pantoprazole  40 mg Oral Daily  . sodium bicarbonate  650 mg Oral TID  . vitamin B-12  1,000 mcg Oral Daily   Continuous Infusions: . sodium chloride 100 mL/hr (10/17/13 1117)      Time spent: 25 minutes    Joury Allcorn, [place]  [facility] Pager [phone]. If 7PM-7AM, please contact night-coverage at [email or web address], password [facility] 10/17/2013, 12:05 PM  LOS: 2 days

## 2013-10-18 DIAGNOSIS — E559 Vitamin D deficiency, unspecified: Secondary | ICD-10-CM | POA: Diagnosis present

## 2013-10-18 DIAGNOSIS — D638 Anemia in other chronic diseases classified elsewhere: Secondary | ICD-10-CM | POA: Diagnosis present

## 2013-10-18 LAB — CBC
HCT: 29.2 % — ABNORMAL LOW (ref 36.0–46.0)
Hemoglobin: 10.1 g/dL — ABNORMAL LOW (ref 12.0–15.0)
MCH: 31.7 pg (ref 26.0–34.0)
MCHC: 34.6 g/dL (ref 30.0–36.0)
MCV: 91.5 fL (ref 78.0–100.0)
PLATELETS: 242 10*3/uL (ref 150–400)
RBC: 3.19 MIL/uL — AB (ref 3.87–5.11)
RDW: 18.6 % — AB (ref 11.5–15.5)
WBC: 5.5 10*3/uL (ref 4.0–10.5)

## 2013-10-18 LAB — TYPE AND SCREEN
ABO/RH(D): A POS
ANTIBODY SCREEN: POSITIVE
DAT, IgG: POSITIVE
DONOR AG TYPE: NEGATIVE
Donor AG Type: NEGATIVE
UNIT DIVISION: 0
Unit division: 0

## 2013-10-18 LAB — BASIC METABOLIC PANEL
ANION GAP: 11 (ref 5–15)
BUN: 11 mg/dL (ref 6–23)
CALCIUM: 7 mg/dL — AB (ref 8.4–10.5)
CO2: 20 mEq/L (ref 19–32)
Chloride: 104 mEq/L (ref 96–112)
Creatinine, Ser: 0.76 mg/dL (ref 0.50–1.10)
GFR calc Af Amer: >90 mL/min (ref >90)
GFR, EST NON AFRICAN AMERICAN: 87 mL/min — AB (ref >90)
Glucose, Bld: 89 mg/dL (ref 70–99)
POTASSIUM: 5 meq/L (ref 3.7–5.3)
SODIUM: 135 meq/L — AB (ref 137–147)

## 2013-10-18 LAB — VITAMIN D 25 HYDROXY (VIT D DEFICIENCY, FRACTURES)

## 2013-10-18 MED ORDER — CALCIUM CARBONATE-VITAMIN D 500-200 MG-UNIT PO TABS: 1.0000 | ORAL_TABLET | Freq: Two times a day (BID) | ORAL | Status: DC

## 2013-10-18 MED ORDER — VITAMIN D (ERGOCALCIFEROL) 1.25 MG (50000 UNIT) PO CAPS: 50000.0000 [IU] | ORAL_CAPSULE | ORAL | Status: DC

## 2013-10-18 MED ORDER — DIPHENOXYLATE-ATROPINE 2.5-0.025 MG PO TABS: 1.0000 | ORAL_TABLET | Freq: Four times a day (QID) | ORAL | Status: DC

## 2013-10-18 MED ORDER — FOLIC ACID 1 MG PO TABS: 1.0000 mg | ORAL_TABLET | Freq: Every day | ORAL | Status: DC

## 2013-10-18 MED ORDER — CYANOCOBALAMIN 1000 MCG PO TABS: 1000.0000 ug | ORAL_TABLET | Freq: Every day | ORAL | Status: DC

## 2013-10-18 MED ORDER — NICOTINE 14 MG/24HR TD PT24: 14.0000 mg | MEDICATED_PATCH | Freq: Every day | TRANSDERMAL | Status: DC

## 2013-10-18 MED ORDER — CALCIUM CARBONATE ANTACID 750 MG PO CHEW: 1.0000 | CHEWABLE_TABLET | Freq: Three times a day (TID) | ORAL | Status: DC

## 2013-10-18 MED ORDER — VITAMIN D (ERGOCALCIFEROL) 1.25 MG (50000 UNIT) PO CAPS
50000.0000 [IU] | ORAL_CAPSULE | ORAL | Status: DC
  Administered 2013-10-18: 50000 [IU] via ORAL
  Filled 2013-10-18: qty 1

## 2013-10-18 MED ORDER — SODIUM BICARBONATE 650 MG PO TABS: 650.0000 mg | ORAL_TABLET | Freq: Two times a day (BID) | ORAL | Status: DC

## 2013-10-18 MED ORDER — CIPROFLOXACIN-HYDROCORTISONE 0.2-1 % OT SUSP: 3.0000 [drp] | Freq: Two times a day (BID) | OTIC | Status: AC

## 2013-10-18 NOTE — Progress Notes (Signed)
Pt leaving at this time with her daughter at her side. Pt alert, oriented, and without c/o. Discharge instructions/prescriptions given/explained with pt verbalizing understanding. Followup appointments noted.

## 2013-10-18 NOTE — Discharge Summary (Signed)
Physician Discharge Summary  [name] KGM:[id number] DOB: 1951/03/16 DOA: 10/15/2013  PCP: Darlin Coco, MD  Admit date: 10/15/2013 Discharge date: 10/18/2013  Time spent: 40 minutes  Recommendations for Outpatient Follow-up:  Home with outpatient followup with PCP, oncology and surgery. Patient will need blood work for CBC and basic metabolic panel in one week.  Discharge Diagnoses:  Principal Problem:   Hypocalcemia   Active Problems:   Atrial fibrillation   Dehydration   CMML (chronic myelomonocytic leukemia)   Ileostomy status with high output   Hypomagnesemia   Anemia of other chronic disease   Metabolic acidosis   COPD (chronic obstructive pulmonary disease)   Tobacco abuse   Anemia of chronic disease   Unspecified vitamin D deficiency   Discharge Condition: Fair  Diet recommendation: Regular  Filed Weights   10/15/13 1525  Weight: 53.524 kg (118 lb)    History of present illness:  Please refer to admission H&P for details, but in brief,63 year old female with history of CMML, ischemic colitis status post partial resection of colon followed by the ostomy with high output , A. fib, and anemia requiring frequent transfusions as outpatient was sent to the ED by her surgeon as she was found to have significant metabolic abnormalities on labs drawn during routine outpatient visit. Patient reports feeling generalized weakness and nausea for past 2 days.  In the ED patient was found to be tachycardic and hypotensive which is resolved with IV fluids. Labs done showed significant hypocalcemia with mild elevation of creatinine of 1.29. She was found to be anemic with hemoglobin of 8 (baseline hemoglobin around 8.6-8.8), hyponatremic with sodium of 644 and metabolic acidosis with bicarbonate of 13 and anion gap of 15. Her magnesium was low at 0.8. The patient admitted to hospitalist service for further management. She was given 1 g of calcium gluconate and 4 g magnesium  sulfate.   Hospital Course:  Hypocalcemia with Dehydration and metabolic acidosis  Likely combination of high ostomy output and concurrent dose of Lasix. Patient has on average is 1-1.5 L ostomy output in 24 hrs. Patient started on IV fluids. She received 4 doses of IV calcium gluconate 1 g was in the hospital. Calcium level has improved to 7 this morning, corrected level of 8.2. Low mg corrected.patient noted to have a very low vitamin D level . -Patient now well hydrated and symptomatically improved. -Patient will be prescribed calcium carbonate tablets and ergocalciferol 50,000 units once a week for low vitamin D. levels. -Patient had low bicarbonate of 13, improved with bicarb drip . I will add sodium bicarbonate tablets upon discharge. Dr Zella Richer increased her dose of lomotil and have added daily b12 and folate.. she will continue taking Imodium as well to reduce the ostomy output.   Hyponatremia  Improved with IV hydration  Acute kidney injury  Secondary to dehydration and Lasix. Resolved with IV fluids   UTI  UA suggestive of UTI. Patient denies any symptoms she was placed on IV Rocephin empirically . Urine culture not sent. She has already received 3 doses of IV Rocephin and will not treat further.  Metabolic acidosis  Improving with bicarb drip   Anemia of chronic disease  Secondary to CMML. Iron panel and B12 level reviewed. Follows with Dr. Alvy Bimler and gets transfusion Periodically. last transfusion was 3 weeks ago for hemoglobin of 6.8. Patient given 2 units PRBC wide in the hospital and hemoglobin has improved to 10. Patient reports being considered for Epogen as outpt   High  output ileostomy status  On average has 1-1.5 L output 24 hours. Increased lomotil dose by surgery. Plan on outpt ileostomy reversal once clinically more stable. Increased dose of Lomotil and Imodium.  A. fib  In sinus rhythm. Continue full dose aspirin   Tobacco abuse Counseled on smoking  cessation. Prescribe nicotine patch.  Patient is clinically stable to be discharged home with outpatient followup.  Diet: Regular   Code Status: Full code  Family Communication: None at bedside  Disposition Plan: Home    Consultants:  Dr Zella Richer    Procedures:  None Antibiotics:  IV Rocephin for UTI     Discharge Exam: Filed Vitals:   10/18/13 0524  BP: 130/67  Pulse: 65  Temp: 98.1 F (36.7 C)  Resp: 16     General: Elderly female lying in bed in no acute distress HEENT: Pallor present, moist oral mucosa,  Cardiovascular: Normal S1 and S2, normal no gallop  Respiratory: Clear bilaterally, no crackles or rhonchi  Abdomen: Soft, nondistended, ileostomy with clear output, nontender, bowel sounds present  Musculoskeletal: Warm, no edema  CNS: AAO x3   Discharge Instructions You were cared for by a hospitalist during your hospital stay. If you have any questions about your discharge medications or the care you received while you were in the hospital after you are discharged, you can call the unit and asked to speak with the hospitalist on call if the hospitalist that took care of you is not available. Once you are discharged, your primary care physician will handle any further medical issues. Please note that NO REFILLS for any discharge medications will be authorized once you are discharged, as it is imperative that you return to your primary care physician (or establish a relationship with a primary care physician if you do not have one) for your aftercare needs so that they can reassess your need for medications and monitor your lab values.     Medication List         albuterol 108 (90 BASE) MCG/ACT inhaler  Commonly known as:  PROVENTIL HFA;VENTOLIN HFA  Inhale 1-2 puffs into the lungs every 6 (six) hours as needed for wheezing or shortness of breath.     aspirin 325 MG EC tablet  Take 325 mg by mouth every morning.     bisoprolol 5 MG tablet  Commonly  known as:  ZEBETA  Take 5 mg by mouth every morning.     buPROPion 300 MG 24 hr tablet  Commonly known as:  WELLBUTRIN XL  Take 300 mg by mouth daily before breakfast.     calcium carbonate 750 MG chewable tablet  Commonly known as:  TUMS E-X 750  Chew 1 tablet (750 mg total) by mouth 3 (three) times daily.     celecoxib 200 MG capsule  Commonly known as:  CELEBREX  Take 200 mg by mouth every morning.     ciprofloxacin-hydrocortisone otic suspension  Commonly known as:  CIPRO HC OTIC  Place 3 drops into the right ear 2 (two) times daily.     cyanocobalamin 1000 MCG tablet  Take 1 tablet (1,000 mcg total) by mouth daily.     cyclobenzaprine 10 MG tablet  Commonly known as:  FLEXERIL  Take 10 mg by mouth 3 (three) times daily as needed for muscle spasms.     diphenoxylate-atropine 2.5-0.025 MG per tablet  Commonly known as:  LOMOTIL  Take 1 tablet by mouth 4 (four) times daily.     esomeprazole 40  MG capsule  Commonly known as:  NEXIUM  Take 40 mg by mouth every morning.     estradiol 2 MG tablet  Commonly known as:  ESTRACE  Take 2 mg by mouth daily before breakfast.     folic acid 1 MG tablet  Commonly known as:  FOLVITE  Take 1 tablet (1 mg total) by mouth daily.     furosemide 40 MG tablet  Commonly known as:  LASIX  Take 40 mg by mouth daily as needed for fluid or edema.     guaiFENesin 600 MG 12 hr tablet  Commonly known as:  MUCINEX  Take 600 mg by mouth 2 (two) times daily as needed for cough or to loosen phlegm.     HYDROcodone-acetaminophen 10-325 MG per tablet  Commonly known as:  NORCO  Take 1 tablet by mouth every 4 (four) hours as needed for moderate pain.     loperamide 2 MG capsule  Commonly known as:  IMODIUM  Take 2 mg by mouth 4 (four) times daily.     loratadine-pseudoephedrine 5-120 MG per tablet  Commonly known as:  CLARITIN-D 12-hour  Take 1 tablet by mouth daily as needed for allergies.     nicotine 14 mg/24hr patch  Commonly known  as:  NICODERM CQ - dosed in mg/24 hours  Place 1 patch (14 mg total) onto the skin daily.     ondansetron 4 MG tablet  Commonly known as:  ZOFRAN  Take 4 mg by mouth every 8 (eight) hours as needed for nausea or vomiting.     sodium bicarbonate 650 MG tablet  Take 1 tablet (650 mg total) by mouth 2 (two) times daily.     Vitamin D (Ergocalciferol) 50000 UNITS Caps capsule  Commonly known as:  DRISDOL  Take 1 capsule (50,000 Units total) by mouth every 7 (seven) days.  Start taking on:  10/25/2013       Allergies  Allergen Reactions  . Vancomycin Hives and Rash    wheezing  . Ativan [Lorazepam] Other (See Comments)    "makes me crazy"  . Codeine Nausea And Vomiting  . Tetanus Toxoids Other (See Comments)    Serum reaction  . Penicillins Hives and Rash  . Xarelto [Rivaroxaban] Hives and Rash    (May not be allergic. Vancomycin was also being taken when reaction occurred.)       Follow-up Information   Follow up with Darlin Coco, MD In 2 weeks.   Specialty:  Cardiology   Contact information:   [address] [place] [address] [phone]       Follow up with [facility], NI, MD. Call in 2 weeks.   Specialty:  Hematology and Oncology   Contact information:   [place] [address] [phone]       Follow up with ROSENBOWER,TODD J, MD. Schedule an appointment as soon as possible for a visit in 2 weeks.   Specialty:  General Surgery   Contact information:   [address] [place] [address] [phone]        The results of significant diagnostics from this hospitalization (including imaging, microbiology, ancillary and laboratory) are listed below for reference.    Significant Diagnostic Studies: Dg Ribs Unilateral W/chest Left  10/15/2013   CLINICAL DATA:  Dizziness and hypotension and recent fall now with mid anterior left rib discomfort  EXAM: LEFT RIBS AND CHEST - 3+ VIEW  COMPARISON:  [facility] chest  x-ray of  Aug 22, 2013.  FINDINGS: The lungs are adequately inflated. There is no pneumothorax, pleural effusion, or pulmonary contusion. The patchy bilateral airspace opacification has become more conspicuous. The heart is top-normal in size. There is stable tortuosity of the descending thoracic aorta. The pulmonary vascularity is not engorged. The PICC line has been removed. The rib detail views reveal no acute fractures.  IMPRESSION: 1. There is no acute rib fracture nor evidence of other acute post traumatic injury. 2. Persistent bilateral pulmonary airspace opacification.   Electronically Signed   By: David  Martinique   On: 10/15/2013 13:05    Microbiology: Recent Results (from the past 240 hour(s))  MRSA PCR SCREENING     Status: Abnormal   Collection Time    10/15/13  8:37 PM      Result Value Ref Range Status   MRSA by PCR POSITIVE (*) NEGATIVE Final   Comment:            The GeneXpert MRSA Assay (FDA     approved for NASAL specimens     only), is one component of a     comprehensive MRSA colonization     surveillance program. It is not     intended to diagnose MRSA     infection nor to guide or     monitor treatment for     MRSA infections.     RESULT CALLED TO, READ BACK BY AND VERIFIED WITH:     L.HUDSON AT 2325 ON 10/15/13 BY [facility]     Labs: Basic Metabolic Panel:  Recent Labs Lab 10/15/13 1154 10/16/13 0451 10/17/13 0444 10/18/13 0520  NA 128* 131* 132* 135*  K 4.7 4.1 4.1 5.0  CL 100 104 103 104  CO2 13* 13* 18* 20  GLUCOSE 110* 135* 106* 89  BUN 17 16 13 11   CREATININE 1.29* 1.01 0.85 0.76  CALCIUM 6.0* 6.1* 6.1* 7.0*  MG 0.8* 1.9  --   --   PHOS 3.1  --   --   --    Liver Function Tests:  Recent Labs Lab 10/15/13 1154 10/16/13 0451  AST 21 14  ALT 14 12  ALKPHOS 551* 493*  BILITOT 0.4 0.3  PROT 7.2 6.2  ALBUMIN 2.7* 2.4*   No results found for this basename: LIPASE, AMYLASE,  in the last 168 hours No results found for this basename: AMMONIA,  in the last 168  hours CBC:  Recent Labs Lab 10/15/13 1154 10/17/13 0444 10/18/13 0520  WBC 11.9* 6.3 5.5  NEUTROABS 6.9  --   --   HGB 8.0* 8.1* 10.1*  HCT 23.9* 23.8* 29.2*  MCV 91.9 90.2 91.5  PLT 317 225 242   Cardiac Enzymes: No results found for this basename: CKTOTAL, CKMB, CKMBINDEX, TROPONINI,  in the last 168 hours BNP: BNP (last 3 results)  Recent Labs  03/24/13 2031 03/25/13 0334 03/26/13 0414  PROBNP 9254.0* 9403.0* 4535.0*   CBG:  Recent Labs Lab 10/16/13 0712  GLUCAP 110*       Signed:  Addysin Porco  [facility] 10/18/2013, 10:39 AM

## 2013-10-22 ENCOUNTER — Other Ambulatory Visit (HOSPITAL_BASED_OUTPATIENT_CLINIC_OR_DEPARTMENT_OTHER): Payer: BC Managed Care – PPO

## 2013-10-22 ENCOUNTER — Telehealth: Payer: Self-pay | Admitting: Hematology and Oncology

## 2013-10-22 ENCOUNTER — Ambulatory Visit (HOSPITAL_BASED_OUTPATIENT_CLINIC_OR_DEPARTMENT_OTHER): Payer: BC Managed Care – PPO | Admitting: Hematology and Oncology

## 2013-10-22 ENCOUNTER — Encounter: Payer: Self-pay | Admitting: Hematology and Oncology

## 2013-10-22 VITALS — BP 107/76 | HR 94 | Temp 97.0°F | Resp 18 | Ht 65.0 in | Wt 132.3 lb

## 2013-10-22 DIAGNOSIS — C9210 Chronic myeloid leukemia, BCR/ABL-positive, not having achieved remission: Secondary | ICD-10-CM

## 2013-10-22 DIAGNOSIS — C9310 Chronic myelomonocytic leukemia not having achieved remission: Secondary | ICD-10-CM

## 2013-10-22 DIAGNOSIS — R197 Diarrhea, unspecified: Secondary | ICD-10-CM

## 2013-10-22 DIAGNOSIS — R5381 Other malaise: Secondary | ICD-10-CM

## 2013-10-22 DIAGNOSIS — E538 Deficiency of other specified B group vitamins: Secondary | ICD-10-CM

## 2013-10-22 DIAGNOSIS — R5383 Other fatigue: Secondary | ICD-10-CM

## 2013-10-22 DIAGNOSIS — D638 Anemia in other chronic diseases classified elsewhere: Secondary | ICD-10-CM

## 2013-10-22 LAB — HOLD TUBE, BLOOD BANK

## 2013-10-22 LAB — CBC & DIFF AND RETIC
BASO%: 0.5 % (ref 0.0–2.0)
BASOS ABS: 0 10*3/uL (ref 0.0–0.1)
EOS ABS: 0.1 10*3/uL (ref 0.0–0.5)
EOS%: 1.5 % (ref 0.0–7.0)
HCT: 32.4 % — ABNORMAL LOW (ref 34.8–46.6)
HEMOGLOBIN: 10.5 g/dL — AB (ref 11.6–15.9)
IMMATURE RETIC FRACT: 9.6 % (ref 1.60–10.00)
LYMPH#: 2.2 10*3/uL (ref 0.9–3.3)
LYMPH%: 29.2 % (ref 14.0–49.7)
MCH: 30.3 pg (ref 25.1–34.0)
MCHC: 32.4 g/dL (ref 31.5–36.0)
MCV: 93.4 fL (ref 79.5–101.0)
MONO#: 1.3 10*3/uL — ABNORMAL HIGH (ref 0.1–0.9)
MONO%: 17.3 % — ABNORMAL HIGH (ref 0.0–14.0)
NEUT%: 51.5 % (ref 38.4–76.8)
NEUTROS ABS: 3.8 10*3/uL (ref 1.5–6.5)
Platelets: 226 10*3/uL (ref 145–400)
RBC: 3.47 10*6/uL — ABNORMAL LOW (ref 3.70–5.45)
RDW: 19.1 % — ABNORMAL HIGH (ref 11.2–14.5)
RETIC %: 1.35 % (ref 0.70–2.10)
RETIC CT ABS: 46.85 10*3/uL (ref 33.70–90.70)
WBC: 7.4 10*3/uL (ref 3.9–10.3)

## 2013-10-22 LAB — TECHNOLOGIST REVIEW

## 2013-10-22 NOTE — Assessment & Plan Note (Signed)
Could be related to status post ileostomy. She will continue high-dose oral B12 supplements. Her last B12 level was adequate.

## 2013-10-22 NOTE — Assessment & Plan Note (Signed)
I discussed with her the risks, benefits and side effects of erythropoietin stimulating agents for treatment of anemia. She agreed to proceed. I will start with Aranesp as 500 mcg every other week with goal of hemoglobin greater than 10 and to reduce transfusion requirements.

## 2013-10-22 NOTE — Progress Notes (Signed)
[place] OFFICE PROGRESS NOTE  Patient Care Team: Darlin Coco, MD as PCP - General (Cardiology) Winfield Cunas., MD (Gastroenterology) Garald Balding, MD (Orthopedic Surgery) Odis Hollingshead, MD as Attending Physician (General Surgery) Heath Lark, MD as Consulting Physician (Hematology and Oncology)  DIAGNOSIS: Chronic anemia with monocytosis, B12 deficiency, CMML  SUMMARY OF ONCOLOGIC HISTORY: The patient had recurrent ischemic colitis in 2013. She has significant surgery, sepsis, recurrent hospitalization including intensive care, recurrent blood transfusion, finally was referred to see a hematologist for evaluation. She had extensive workup and subsequently was found to have B12 deficiency along with the diagnosis of CMML. She had a bone marrow aspirate and biopsy which was not grossly abnormal but suggestive of possible diagnosis of CMML due to mild dyspoiesis with mildly elevated monocyte count. She was being observed. Since she was last seen here, she had repeated admission to the hospital for recurrent infection. She was transfused intermittently.  INTERVAL HISTORY: Please see below for problem oriented charting. She was recently admitted to the hospital for dehydration and received further blood transfusion. She complained of fatigue. She continued to have high output diarrhea. She felt dizzy overall. Sandostatin injection was not helpful. She continued to have oozing from her wound which is not healed.  REVIEW OF SYSTEMS:   Constitutional: Denies fevers, chills or abnormal weight loss Eyes: Denies blurriness of vision Ears, nose, mouth, throat, and face: Denies mucositis or sore throat Respiratory: Denies cough, dyspnea or wheezes Cardiovascular: Denies palpitation, chest discomfort or lower extremity swelling Skin: Denies abnormal skin rashes Lymphatics: Denies new lymphadenopathy or easy bruising Neurological:Denies numbness, tingling or new  weaknesses Behavioral/Psych: Mood is stable, no new changes  All other systems were reviewed with the patient and are negative.  I have reviewed the past medical history, past surgical history, social history and family history with the patient and they are unchanged from previous note.  ALLERGIES:  is allergic to vancomycin; ativan; codeine; tetanus toxoids; penicillins; and xarelto.  MEDICATIONS:  Current Outpatient Prescriptions  Medication Sig Dispense Refill  . albuterol (PROVENTIL HFA;VENTOLIN HFA) 108 (90 BASE) MCG/ACT inhaler Inhale 1-2 puffs into the lungs every 6 (six) hours as needed for wheezing or shortness of breath.       Marland Kitchen aspirin 325 MG EC tablet Take 325 mg by mouth every morning.       . bisoprolol (ZEBETA) 5 MG tablet Take 5 mg by mouth every morning.      Marland Kitchen buPROPion (WELLBUTRIN XL) 300 MG 24 hr tablet Take 300 mg by mouth daily before breakfast.       . calcium carbonate (TUMS E-X 750) 750 MG chewable tablet Chew 1 tablet (750 mg total) by mouth 3 (three) times daily.  90 tablet  0  . celecoxib (CELEBREX) 200 MG capsule Take 200 mg by mouth every morning.       . ciprofloxacin-hydrocortisone (CIPRO HC OTIC) otic suspension Place 3 drops into the right ear 2 (two) times daily.  10 mL  0  . cyclobenzaprine (FLEXERIL) 10 MG tablet Take 10 mg by mouth 3 (three) times daily as needed for muscle spasms.       . diphenoxylate-atropine (LOMOTIL) 2.5-0.025 MG per tablet Take 1 tablet by mouth 4 (four) times daily.  30 tablet  0  . esomeprazole (NEXIUM) 40 MG capsule Take 40 mg by mouth every morning.       Marland Kitchen estradiol (ESTRACE) 2 MG tablet Take 2 mg by mouth daily before  breakfast.       . folic acid (FOLVITE) 1 MG tablet Take 1 tablet (1 mg total) by mouth daily.  30 tablet  0  . guaiFENesin (MUCINEX) 600 MG 12 hr tablet Take 600 mg by mouth 2 (two) times daily as needed for cough or to loosen phlegm.      Marland Kitchen HYDROcodone-acetaminophen (NORCO) 10-325 MG per tablet Take 1 tablet by  mouth every 4 (four) hours as needed for moderate pain.       Marland Kitchen loperamide (IMODIUM) 2 MG capsule Take 2 mg by mouth 4 (four) times daily.       Marland Kitchen loratadine-pseudoephedrine (CLARITIN-D 12-HOUR) 5-120 MG per tablet Take 1 tablet by mouth daily as needed for allergies.       . nicotine (NICODERM CQ - DOSED IN MG/24 HOURS) 14 mg/24hr patch Place 1 patch (14 mg total) onto the skin daily.  28 patch  0  . ondansetron (ZOFRAN) 4 MG tablet Take 4 mg by mouth every 8 (eight) hours as needed for nausea or vomiting.      . sodium bicarbonate 650 MG tablet Take 1 tablet (650 mg total) by mouth 2 (two) times daily.  60 tablet  0  . vitamin B-12 1000 MCG tablet Take 1 tablet (1,000 mcg total) by mouth daily.  30 tablet  0  . [START ON 10/25/2013] Vitamin D, Ergocalciferol, (DRISDOL) 50000 UNITS CAPS capsule Take 1 capsule (50,000 Units total) by mouth every 7 (seven) days.  12 capsule  0  . furosemide (LASIX) 40 MG tablet Take 40 mg by mouth daily as needed for fluid or edema.        No current facility-administered medications for this visit.    PHYSICAL EXAMINATION: ECOG PERFORMANCE STATUS: 1 - Symptomatic but completely ambulatory  Filed Vitals:   10/22/13 1117  BP: 107/76  Pulse: 94  Temp: 97 F (36.1 C)  Resp: 18   Filed Weights   10/22/13 1117  Weight: 132 lb 4.8 oz (60.011 kg)    GENERAL:alert, no distress and comfortable. She looks thin SKIN: skin color, texture, turgor are normal, no rashes or significant lesions EYES: normal, Conjunctiva are pale and non-injected, sclera clear OROPHARYNX:no exudate, no erythema and lips, buccal mucosa, and tongue normal  Musculoskeletal:no cyanosis of digits and no clubbing  NEURO: alert & oriented x 3 with fluent speech, no focal motor/sensory deficits  LABORATORY DATA:  I have reviewed the data as listed    Component Value Date/Time   NA 135* 10/18/2013 0520   NA 133* 08/28/2013 1211   K 5.0 10/18/2013 0520   K 3.7 08/28/2013 1211   CL 104  10/18/2013 0520   CL 101 02/09/2012 1321   CO2 20 10/18/2013 0520   CO2 16* 08/28/2013 1211   GLUCOSE 89 10/18/2013 0520   GLUCOSE 79 08/28/2013 1211   GLUCOSE 79 02/09/2012 1321   BUN 11 10/18/2013 0520   BUN 8.8 08/28/2013 1211   CREATININE 0.76 10/18/2013 0520   CREATININE 0.8 08/28/2013 1211   CREATININE 0.92 08/10/2012 1528   CALCIUM 7.0* 10/18/2013 0520   CALCIUM 6.7* 08/28/2013 1211   PROT 6.2 10/16/2013 0451   PROT 6.9 08/28/2013 1211   ALBUMIN 2.4* 10/16/2013 0451   ALBUMIN 2.4* 08/28/2013 1211   AST 14 10/16/2013 0451   AST 19 08/28/2013 1211   ALT 12 10/16/2013 0451   ALT 14 08/28/2013 1211   ALKPHOS 493* 10/16/2013 0451   ALKPHOS 495* 08/28/2013 1211   BILITOT 0.3  10/16/2013 0451   BILITOT 0.37 08/28/2013 1211   GFRNONAA 87* 10/18/2013 0520   GFRAA >90 10/18/2013 0520    No results found for this basename: SPEP,  UPEP,   kappa and lambda light chains    Lab Results  Component Value Date   WBC 7.4 10/22/2013   NEUTROABS 3.8 10/22/2013   HGB 10.5* 10/22/2013   HCT 32.4* 10/22/2013   MCV 93.4 10/22/2013   PLT 226 10/22/2013      Chemistry      Component Value Date/Time   NA 135* 10/18/2013 0520   NA 133* 08/28/2013 1211   K 5.0 10/18/2013 0520   K 3.7 08/28/2013 1211   CL 104 10/18/2013 0520   CL 101 02/09/2012 1321   CO2 20 10/18/2013 0520   CO2 16* 08/28/2013 1211   BUN 11 10/18/2013 0520   BUN 8.8 08/28/2013 1211   CREATININE 0.76 10/18/2013 0520   CREATININE 0.8 08/28/2013 1211   CREATININE 0.92 08/10/2012 1528      Component Value Date/Time   CALCIUM 7.0* 10/18/2013 0520   CALCIUM 6.7* 08/28/2013 1211   ALKPHOS 493* 10/16/2013 0451   ALKPHOS 495* 08/28/2013 1211   AST 14 10/16/2013 0451   AST 19 08/28/2013 1211   ALT 12 10/16/2013 0451   ALT 14 08/28/2013 1211   BILITOT 0.3 10/16/2013 0451   BILITOT 0.37 08/28/2013 1211     ASSESSMENT & PLAN:  CMML (chronic myelomonocytic leukemia) I discussed with her the risks, benefits and side effects of erythropoietin stimulating agents for treatment of anemia. She agreed  to proceed. I will start with Aranesp as 500 mcg every other week with goal of hemoglobin greater than 10 and to reduce transfusion requirements.  Anemia of chronic disease We discussed some of the risks, benefits, and alternatives of blood transfusions.  She will continue her blood count monitoring every other week and to get blood transfusion if is less than 8 g, 2 units each time.   B12 deficiency Could be related to status post ileostomy. She will continue high-dose oral B12 supplements. Her last B12 level was adequate.  Diarrhea Previously, I gave her Sandostatin injection. She denies any benefit. I would discontinue that. She will continue Lomotil and increase oral hydration.    No orders of the defined types were placed in this encounter.   All questions were answered. The patient knows to call the clinic with any problems, questions or concerns. No barriers to learning was detected. I spent 25 minutes counseling the patient face to face. The total time spent in the appointment was 30 minutes and more than 50% was on counseling and review of test results     [facility], Keanu Lesniak, MD 10/22/2013 12:21 PM

## 2013-10-22 NOTE — Telephone Encounter (Signed)
Gave pt appt for lab, injection and MD until november 2015

## 2013-10-22 NOTE — Assessment & Plan Note (Signed)
Previously, I gave her Sandostatin injection. She denies any benefit. I would discontinue that. She will continue Lomotil and increase oral hydration.

## 2013-10-22 NOTE — Assessment & Plan Note (Signed)
We discussed some of the risks, benefits, and alternatives of blood transfusions.  She will continue her blood count monitoring every other week and to get blood transfusion if is less than 8 g, 2 units each time.

## 2013-10-24 ENCOUNTER — Ambulatory Visit: Payer: BC Managed Care – PPO | Admitting: Cardiology

## 2013-11-01 ENCOUNTER — Telehealth (INDEPENDENT_AMBULATORY_CARE_PROVIDER_SITE_OTHER): Payer: Self-pay

## 2013-11-01 DIAGNOSIS — R197 Diarrhea, unspecified: Secondary | ICD-10-CM

## 2013-11-01 MED ORDER — DIPHENOXYLATE-ATROPINE 2.5-0.025 MG PO TABS: 1.0000 | ORAL_TABLET | Freq: Four times a day (QID) | ORAL | Status: DC

## 2013-11-01 NOTE — Telephone Encounter (Signed)
Noted  

## 2013-11-01 NOTE — Telephone Encounter (Addendum)
Called pt to notify her that we refilled the Lomotil 2.5-0.025mg  #30 x 4rf's per Dr Zella Richer. Pt understands. Rx not able to be e prescribed so I called [facility] on [address] to give a verbal order to the pharmacist.

## 2013-11-01 NOTE — Addendum Note (Signed)
Addended by: Illene Regulus on: 11/01/2013 03:22 PM   Modules accepted: Orders

## 2013-11-01 NOTE — Telephone Encounter (Signed)
Pt s/p exp lap. Pt was calling today to get a refill on her Lomotil. Pt states that she has been taking Lomotil four times a day. Pt states that she feels the Lomotil has been helping her. Informed pt that would send Dr Zella Richer a message and we would contact her as soon as we receive a response. Pt verbalized understanding and agrees with POC.

## 2013-11-05 ENCOUNTER — Other Ambulatory Visit (HOSPITAL_BASED_OUTPATIENT_CLINIC_OR_DEPARTMENT_OTHER): Payer: BC Managed Care – PPO

## 2013-11-05 ENCOUNTER — Ambulatory Visit (HOSPITAL_BASED_OUTPATIENT_CLINIC_OR_DEPARTMENT_OTHER): Payer: BC Managed Care – PPO

## 2013-11-05 VITALS — BP 112/68 | HR 90 | Temp 97.3°F

## 2013-11-05 DIAGNOSIS — D638 Anemia in other chronic diseases classified elsewhere: Secondary | ICD-10-CM

## 2013-11-05 DIAGNOSIS — C9310 Chronic myelomonocytic leukemia not having achieved remission: Secondary | ICD-10-CM

## 2013-11-05 DIAGNOSIS — E860 Dehydration: Secondary | ICD-10-CM

## 2013-11-05 DIAGNOSIS — Z932 Ileostomy status: Secondary | ICD-10-CM

## 2013-11-05 LAB — CBC & DIFF AND RETIC
BASO%: 0.1 % (ref 0.0–2.0)
Basophils Absolute: 0 10*3/uL (ref 0.0–0.1)
EOS%: 1.5 % (ref 0.0–7.0)
Eosinophils Absolute: 0.1 10*3/uL (ref 0.0–0.5)
HCT: 28.6 % — ABNORMAL LOW (ref 34.8–46.6)
HGB: 9.4 g/dL — ABNORMAL LOW (ref 11.6–15.9)
Immature Retic Fract: 8.9 % (ref 1.60–10.00)
LYMPH#: 2.1 10*3/uL (ref 0.9–3.3)
LYMPH%: 25.4 % (ref 14.0–49.7)
MCH: 30.8 pg (ref 25.1–34.0)
MCHC: 32.9 g/dL (ref 31.5–36.0)
MCV: 93.8 fL (ref 79.5–101.0)
MONO#: 2.3 10*3/uL — ABNORMAL HIGH (ref 0.1–0.9)
MONO%: 28.7 % — ABNORMAL HIGH (ref 0.0–14.0)
NEUT#: 3.6 10*3/uL (ref 1.5–6.5)
NEUT%: 44.3 % (ref 38.4–76.8)
PLATELETS: 277 10*3/uL (ref 145–400)
RBC: 3.05 10*6/uL — AB (ref 3.70–5.45)
RDW: 18.8 % — ABNORMAL HIGH (ref 11.2–14.5)
Retic %: 1.17 % (ref 0.70–2.10)
Retic Ct Abs: 35.69 10*3/uL (ref 33.70–90.70)
WBC: 8.2 10*3/uL (ref 3.9–10.3)

## 2013-11-05 LAB — HOLD TUBE, BLOOD BANK

## 2013-11-05 LAB — TECHNOLOGIST REVIEW

## 2013-11-05 MED ORDER — DARBEPOETIN ALFA-POLYSORBATE 300 MCG/0.6ML IJ SOLN
300.0000 ug | Freq: Once | INTRAMUSCULAR | Status: AC
  Administered 2013-11-05: 300 ug via SUBCUTANEOUS
  Filled 2013-11-05: qty 0.6

## 2013-11-05 NOTE — Patient Instructions (Signed)
Darbepoetin Alfa injection What is this medicine? DARBEPOETIN ALFA (dar be POE e tin AL fa) helps your body make more red blood cells. It is used to treat anemia caused by chronic kidney failure and chemotherapy. This medicine may be used for other purposes; ask your health care provider or pharmacist if you have questions. COMMON BRAND NAME(S): Aranesp What should I tell my health care provider before I take this medicine? They need to know if you have any of these conditions: -blood clotting disorders or history of blood clots -cancer patient not on chemotherapy -cystic fibrosis -heart disease, such as angina, heart failure, or a history of a heart attack -hemoglobin level of 12 g/dL or greater -high blood pressure -low levels of folate, iron, or vitamin B12 -seizures -an unusual or allergic reaction to darbepoetin, erythropoietin, albumin, hamster proteins, latex, other medicines, foods, dyes, or preservatives -pregnant or trying to get pregnant -breast-feeding How should I use this medicine? This medicine is for injection into a vein or under the skin. It is usually given by a health care professional in a hospital or clinic setting. If you get this medicine at home, you will be taught how to prepare and give this medicine. Do not shake the solution before you withdraw a dose. Use exactly as directed. Take your medicine at regular intervals. Do not take your medicine more often than directed. It is important that you put your used needles and syringes in a special sharps container. Do not put them in a trash can. If you do not have a sharps container, call your pharmacist or healthcare provider to get one. Talk to your pediatrician regarding the use of this medicine in children. While this medicine may be used in children as young as 1 year for selected conditions, precautions do apply. Overdosage: If you think you have taken too much of this medicine contact a poison control center or  emergency room at once. NOTE: This medicine is only for you. Do not share this medicine with others. What if I miss a dose? If you miss a dose, take it as soon as you can. If it is almost time for your next dose, take only that dose. Do not take double or extra doses. What may interact with this medicine? Do not take this medicine with any of the following medications: -epoetin alfa This list may not describe all possible interactions. Give your health care provider a list of all the medicines, herbs, non-prescription drugs, or dietary supplements you use. Also tell them if you smoke, drink alcohol, or use illegal drugs. Some items may interact with your medicine. What should I watch for while using this medicine? Visit your prescriber or health care professional for regular checks on your progress and for the needed blood tests and blood pressure measurements. It is especially important for the doctor to make sure your hemoglobin level is in the desired range, to limit the risk of potential side effects and to give you the best benefit. Keep all appointments for any recommended tests. Check your blood pressure as directed. Ask your doctor what your blood pressure should be and when you should contact him or her. As your body makes more red blood cells, you may need to take iron, folic acid, or vitamin B supplements. Ask your doctor or health care provider which products are right for you. If you have kidney disease continue dietary restrictions, even though this medication can make you feel better. Talk with your doctor or health   care professional about the foods you eat and the vitamins that you take. What side effects may I notice from receiving this medicine? Side effects that you should report to your doctor or health care professional as soon as possible: -allergic reactions like skin rash, itching or hives, swelling of the face, lips, or tongue -breathing problems -changes in vision -chest  pain -confusion, trouble speaking or understanding -feeling faint or lightheaded, falls -high blood pressure -muscle aches or pains -pain, swelling, warmth in the leg -rapid weight gain -severe headaches -sudden numbness or weakness of the face, arm or leg -trouble walking, dizziness, loss of balance or coordination -seizures (convulsions) -swelling of the ankles, feet, hands -unusually weak or tired Side effects that usually do not require medical attention (report to your doctor or health care professional if they continue or are bothersome): -diarrhea -fever, chills (flu-like symptoms) -headaches -nausea, vomiting -redness, stinging, or swelling at site where injected This list may not describe all possible side effects. Call your doctor for medical advice about side effects. You may report side effects to FDA at 1-800-FDA-1088. Where should I keep my medicine? Keep out of the reach of children. Store in a refrigerator between 2 and 8 degrees C (36 and 46 degrees F). Do not freeze. Do not shake. Throw away any unused portion if using a single-dose vial. Throw away any unused medicine after the expiration date. NOTE: This sheet is a summary. It may not cover all possible information. If you have questions about this medicine, talk to your doctor, pharmacist, or health care provider.  2015, [employer]. (2008-03-18 10:23:57)  

## 2013-11-18 ENCOUNTER — Encounter (INDEPENDENT_AMBULATORY_CARE_PROVIDER_SITE_OTHER): Payer: BC Managed Care – PPO | Admitting: General Surgery

## 2013-11-18 ENCOUNTER — Emergency Department (HOSPITAL_COMMUNITY): Payer: BC Managed Care – PPO

## 2013-11-18 ENCOUNTER — Inpatient Hospital Stay (HOSPITAL_COMMUNITY)
Admission: EM | Admit: 2013-11-18 | Discharge: 2013-11-27 | DRG: 871 | Disposition: A | Discharge status: Home / Self Care | Payer: BC Managed Care – PPO | Attending: Internal Medicine | Admitting: Internal Medicine

## 2013-11-18 ENCOUNTER — Encounter (HOSPITAL_COMMUNITY): Payer: Self-pay | Admitting: Emergency Medicine

## 2013-11-18 ENCOUNTER — Telehealth: Payer: Self-pay | Admitting: Dietician

## 2013-11-18 DIAGNOSIS — S31109A Unspecified open wound of abdominal wall, unspecified quadrant without penetration into peritoneal cavity, initial encounter: Secondary | ICD-10-CM

## 2013-11-18 DIAGNOSIS — A419 Sepsis, unspecified organism: Secondary | ICD-10-CM | POA: Diagnosis not present

## 2013-11-18 DIAGNOSIS — N179 Acute kidney failure, unspecified: Secondary | ICD-10-CM

## 2013-11-18 DIAGNOSIS — E222 Syndrome of inappropriate secretion of antidiuretic hormone: Secondary | ICD-10-CM

## 2013-11-18 DIAGNOSIS — C9310 Chronic myelomonocytic leukemia not having achieved remission: Secondary | ICD-10-CM | POA: Diagnosis present

## 2013-11-18 DIAGNOSIS — I4891 Unspecified atrial fibrillation: Secondary | ICD-10-CM | POA: Diagnosis present

## 2013-11-18 DIAGNOSIS — R0603 Acute respiratory distress: Secondary | ICD-10-CM

## 2013-11-18 DIAGNOSIS — N10 Acute tubulo-interstitial nephritis: Secondary | ICD-10-CM

## 2013-11-18 DIAGNOSIS — R062 Wheezing: Secondary | ICD-10-CM | POA: Diagnosis present

## 2013-11-18 DIAGNOSIS — D899 Disorder involving the immune mechanism, unspecified: Secondary | ICD-10-CM | POA: Diagnosis present

## 2013-11-18 DIAGNOSIS — F17200 Nicotine dependence, unspecified, uncomplicated: Secondary | ICD-10-CM | POA: Diagnosis present

## 2013-11-18 DIAGNOSIS — E43 Unspecified severe protein-calorie malnutrition: Secondary | ICD-10-CM | POA: Insufficient documentation

## 2013-11-18 DIAGNOSIS — Z79899 Other long term (current) drug therapy: Secondary | ICD-10-CM

## 2013-11-18 DIAGNOSIS — Z8614 Personal history of Methicillin resistant Staphylococcus aureus infection: Secondary | ICD-10-CM

## 2013-11-18 DIAGNOSIS — Z720 Tobacco use: Secondary | ICD-10-CM

## 2013-11-18 DIAGNOSIS — E861 Hypovolemia: Secondary | ICD-10-CM | POA: Diagnosis present

## 2013-11-18 DIAGNOSIS — I10 Essential (primary) hypertension: Secondary | ICD-10-CM | POA: Diagnosis present

## 2013-11-18 DIAGNOSIS — Z823 Family history of stroke: Secondary | ICD-10-CM

## 2013-11-18 DIAGNOSIS — E669 Obesity, unspecified: Secondary | ICD-10-CM | POA: Diagnosis present

## 2013-11-18 DIAGNOSIS — Z22322 Carrier or suspected carrier of Methicillin resistant Staphylococcus aureus: Secondary | ICD-10-CM

## 2013-11-18 DIAGNOSIS — Z791 Long term (current) use of non-steroidal anti-inflammatories (NSAID): Secondary | ICD-10-CM

## 2013-11-18 DIAGNOSIS — J4489 Other specified chronic obstructive pulmonary disease: Secondary | ICD-10-CM | POA: Diagnosis present

## 2013-11-18 DIAGNOSIS — K219 Gastro-esophageal reflux disease without esophagitis: Secondary | ICD-10-CM | POA: Diagnosis present

## 2013-11-18 DIAGNOSIS — I959 Hypotension, unspecified: Secondary | ICD-10-CM | POA: Diagnosis present

## 2013-11-18 DIAGNOSIS — E876 Hypokalemia: Secondary | ICD-10-CM | POA: Diagnosis present

## 2013-11-18 DIAGNOSIS — Z7982 Long term (current) use of aspirin: Secondary | ICD-10-CM

## 2013-11-18 DIAGNOSIS — Z9049 Acquired absence of other specified parts of digestive tract: Secondary | ICD-10-CM

## 2013-11-18 DIAGNOSIS — J189 Pneumonia, unspecified organism: Secondary | ICD-10-CM | POA: Diagnosis present

## 2013-11-18 DIAGNOSIS — E872 Acidosis, unspecified: Secondary | ICD-10-CM | POA: Diagnosis present

## 2013-11-18 DIAGNOSIS — R339 Retention of urine, unspecified: Secondary | ICD-10-CM | POA: Diagnosis present

## 2013-11-18 DIAGNOSIS — K912 Postsurgical malabsorption, not elsewhere classified: Secondary | ICD-10-CM | POA: Diagnosis present

## 2013-11-18 DIAGNOSIS — G8929 Other chronic pain: Secondary | ICD-10-CM

## 2013-11-18 DIAGNOSIS — R6521 Severe sepsis with septic shock: Secondary | ICD-10-CM

## 2013-11-18 DIAGNOSIS — J449 Chronic obstructive pulmonary disease, unspecified: Secondary | ICD-10-CM | POA: Diagnosis present

## 2013-11-18 DIAGNOSIS — E875 Hyperkalemia: Secondary | ICD-10-CM | POA: Diagnosis present

## 2013-11-18 DIAGNOSIS — Z96649 Presence of unspecified artificial hip joint: Secondary | ICD-10-CM

## 2013-11-18 DIAGNOSIS — R945 Abnormal results of liver function studies: Secondary | ICD-10-CM

## 2013-11-18 DIAGNOSIS — R6520 Severe sepsis without septic shock: Secondary | ICD-10-CM

## 2013-11-18 DIAGNOSIS — R531 Weakness: Secondary | ICD-10-CM

## 2013-11-18 DIAGNOSIS — Z8249 Family history of ischemic heart disease and other diseases of the circulatory system: Secondary | ICD-10-CM

## 2013-11-18 DIAGNOSIS — I482 Chronic atrial fibrillation, unspecified: Secondary | ICD-10-CM

## 2013-11-18 DIAGNOSIS — R918 Other nonspecific abnormal finding of lung field: Secondary | ICD-10-CM

## 2013-11-18 DIAGNOSIS — N390 Urinary tract infection, site not specified: Secondary | ICD-10-CM | POA: Diagnosis present

## 2013-11-18 DIAGNOSIS — D72829 Elevated white blood cell count, unspecified: Secondary | ICD-10-CM | POA: Diagnosis present

## 2013-11-18 DIAGNOSIS — C9210 Chronic myeloid leukemia, BCR/ABL-positive, not having achieved remission: Secondary | ICD-10-CM | POA: Diagnosis present

## 2013-11-18 DIAGNOSIS — E860 Dehydration: Secondary | ICD-10-CM

## 2013-11-18 DIAGNOSIS — IMO0002 Reserved for concepts with insufficient information to code with codable children: Secondary | ICD-10-CM

## 2013-11-18 DIAGNOSIS — B3749 Other urogenital candidiasis: Secondary | ICD-10-CM

## 2013-11-18 DIAGNOSIS — J441 Chronic obstructive pulmonary disease with (acute) exacerbation: Secondary | ICD-10-CM

## 2013-11-18 DIAGNOSIS — D638 Anemia in other chronic diseases classified elsewhere: Secondary | ICD-10-CM | POA: Diagnosis present

## 2013-11-18 DIAGNOSIS — D630 Anemia in neoplastic disease: Secondary | ICD-10-CM | POA: Diagnosis present

## 2013-11-18 DIAGNOSIS — I480 Paroxysmal atrial fibrillation: Secondary | ICD-10-CM

## 2013-11-18 DIAGNOSIS — E538 Deficiency of other specified B group vitamins: Secondary | ICD-10-CM

## 2013-11-18 DIAGNOSIS — R110 Nausea: Secondary | ICD-10-CM

## 2013-11-18 DIAGNOSIS — E871 Hypo-osmolality and hyponatremia: Secondary | ICD-10-CM

## 2013-11-18 DIAGNOSIS — M549 Dorsalgia, unspecified: Secondary | ICD-10-CM

## 2013-11-18 DIAGNOSIS — Z932 Ileostomy status: Secondary | ICD-10-CM

## 2013-11-18 DIAGNOSIS — N170 Acute kidney failure with tubular necrosis: Secondary | ICD-10-CM | POA: Diagnosis present

## 2013-11-18 DIAGNOSIS — A4902 Methicillin resistant Staphylococcus aureus infection, unspecified site: Secondary | ICD-10-CM | POA: Diagnosis present

## 2013-11-18 DIAGNOSIS — R7989 Other specified abnormal findings of blood chemistry: Secondary | ICD-10-CM

## 2013-11-18 DIAGNOSIS — J9600 Acute respiratory failure, unspecified whether with hypoxia or hypercapnia: Secondary | ICD-10-CM | POA: Diagnosis present

## 2013-11-18 LAB — URINALYSIS, ROUTINE W REFLEX MICROSCOPIC
Glucose, UA: NEGATIVE mg/dL
KETONES UR: NEGATIVE mg/dL
NITRITE: POSITIVE — AB
PROTEIN: 30 mg/dL — AB
Specific Gravity, Urine: 1.015 (ref 1.005–1.030)
Urobilinogen, UA: 0.2 mg/dL (ref 0.0–1.0)
pH: 6.5 (ref 5.0–8.0)

## 2013-11-18 LAB — CBC WITH DIFFERENTIAL/PLATELET
BASOS PCT: 0 % (ref 0–1)
Basophils Absolute: 0 10*3/uL (ref 0.0–0.1)
EOS PCT: 0 % (ref 0–5)
Eosinophils Absolute: 0 10*3/uL (ref 0.0–0.7)
HCT: 29.7 % — ABNORMAL LOW (ref 36.0–46.0)
HEMOGLOBIN: 10.1 g/dL — AB (ref 12.0–15.0)
Lymphocytes Relative: 7 % — ABNORMAL LOW (ref 12–46)
Lymphs Abs: 1.4 10*3/uL (ref 0.7–4.0)
MCH: 33.3 pg (ref 26.0–34.0)
MCHC: 34 g/dL (ref 30.0–36.0)
MCV: 98 fL (ref 78.0–100.0)
MONO ABS: 5.3 10*3/uL — AB (ref 0.1–1.0)
Monocytes Relative: 27 % — ABNORMAL HIGH (ref 3–12)
NEUTROS ABS: 13 10*3/uL — AB (ref 1.7–7.7)
Neutrophils Relative %: 66 % (ref 43–77)
Platelets: 315 10*3/uL (ref 150–400)
RBC: 3.03 MIL/uL — AB (ref 3.87–5.11)
RDW: 19.7 % — ABNORMAL HIGH (ref 11.5–15.5)
WBC: 19.7 10*3/uL — ABNORMAL HIGH (ref 4.0–10.5)

## 2013-11-18 LAB — COMPREHENSIVE METABOLIC PANEL
ALK PHOS: 708 U/L — AB (ref 39–117)
ALT: 9 U/L (ref 0–35)
ANION GAP: 21 — AB (ref 5–15)
AST: 13 U/L (ref 0–37)
Albumin: 3.3 g/dL — ABNORMAL LOW (ref 3.5–5.2)
BILIRUBIN TOTAL: 0.4 mg/dL (ref 0.3–1.2)
BUN: 51 mg/dL — AB (ref 6–23)
CHLORIDE: 101 meq/L (ref 96–112)
CO2: 9 meq/L — AB (ref 19–32)
Calcium: 6.8 mg/dL — ABNORMAL LOW (ref 8.4–10.5)
Creatinine, Ser: 2.23 mg/dL — ABNORMAL HIGH (ref 0.50–1.10)
GFR, EST AFRICAN AMERICAN: 26 mL/min — AB (ref >90)
GFR, EST NON AFRICAN AMERICAN: 22 mL/min — AB (ref >90)
GLUCOSE: 103 mg/dL — AB (ref 70–99)
POTASSIUM: 5.6 meq/L — AB (ref 3.7–5.3)
Sodium: 131 mEq/L — ABNORMAL LOW (ref 137–147)
Total Protein: 8 g/dL (ref 6.0–8.3)

## 2013-11-18 LAB — URINE MICROSCOPIC-ADD ON

## 2013-11-18 MED ORDER — SODIUM CHLORIDE 0.9 % IV BOLUS (SEPSIS)
1000.0000 mL | Freq: Once | INTRAVENOUS | Status: AC
  Administered 2013-11-18: 1000 mL via INTRAVENOUS

## 2013-11-18 MED ORDER — ONDANSETRON HCL 4 MG/2ML IJ SOLN
4.0000 mg | Freq: Once | INTRAMUSCULAR | Status: AC
  Administered 2013-11-18: 4 mg via INTRAVENOUS
  Filled 2013-11-18: qty 2

## 2013-11-18 MED ORDER — PROMETHAZINE HCL 25 MG/ML IJ SOLN
25.0000 mg | Freq: Once | INTRAMUSCULAR | Status: AC
  Administered 2013-11-18: 25 mg via INTRAVENOUS
  Filled 2013-11-18: qty 1

## 2013-11-18 NOTE — Telephone Encounter (Signed)
Brief Outpatient Oncology Nutrition Note  Patient has been identified to be at risk on malnutrition screen.  Wt Readings from Last 10 Encounters:  10/22/13 132 lb 4.8 oz (60.011 kg)  10/15/13 118 lb (53.524 kg)  10/15/13 118 lb 3.2 oz (53.615 kg)  09/26/13 124 lb 9.6 oz (56.518 kg)  08/28/13 138 lb (62.596 kg)  08/22/13 136 lb 11.2 oz (62.007 kg)  08/21/13 135 lb 12.8 oz (61.598 kg)  07/22/13 135 lb 3.2 oz (61.326 kg)  05/22/13 139 lb (63.05 kg)  05/06/13 134 lb 12.8 oz (61.145 kg)    Dx:  CMML, Anemia for chronic disease, Vitamin B 12 deficiency, diarrhea.   Hx includes:  Recurrent ischemic colitis in 2013 which required ileostomy, sepsis and recurrent ICU admits.    Called patient due to weight.  Weight appears to have increased.  Patient was unavailable.  Left message with contact information for the [place] [facility] for any nutrition related questions.  Antonieta Iba, RD, LDN

## 2013-11-18 NOTE — ED Notes (Signed)
I attempted multiple times to obtain pulse ox and pulse on patient and have been unsuccessful multiple times. RN Ledell Noss notified as well as Loma Sousa, [place].

## 2013-11-18 NOTE — ED Notes (Signed)
Pt reports recently receiving epogen x 2 weeks ago and became nauseous right after and has been since.  Pt reports she has not been able to eat or drink d/t nausea.  Pt also reports dizziness when standing up

## 2013-11-18 NOTE — ED Notes (Signed)
Pt refusing blood cultures. Courtney PA notified.

## 2013-11-18 NOTE — ED Notes (Signed)
Lab called for stick for blood cultures.

## 2013-11-18 NOTE — ED Provider Notes (Signed)
CSN: [id number]     Arrival date & time 11/18/13  1832 History   First MD Initiated Contact with Patient 11/18/13 1904     Chief Complaint  Patient presents with  . Nausea  . Dizziness   HPI  Patient is a 63 y.o. 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  Patient states that she has had worsening nausea for the past two weeks after being given epogen for chronic anemia by Dr. Alvy Bimler.  Patient states that since that time she has had worsening nausea without vomiting.  Due to the nausea she states that she hasn't been eating and drinking at this time.  Patient states that she is also having a lot of dizziness which is both at rest and with movement.  She states that she can usually ambulate on her own, but at this time cannot walk around. She denies any chest pain at this time.  Past Medical History  Diagnosis Date  . Hypertension     She has a past hx of essential  . 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 Inflammation of joint of knee     Since we last last saw her she developed problems with an acute which required surgical drainage by her orthopedist Dr. Durward Fortes.  Marland Kitchen MRSA (methicillin resistant Staphylococcus aureus)     Knee surgery drainage was positive for MRSA and she was treated with 3 weeks of doxycycline successfully.  . Diarrhea     Mild  . Exogenous obesity   . GERD (gastroesophageal reflux disease)     2 hosp.- ischemic colitis - residual of Norovirus, 05/2011- sm. bowel obstruction  . Headache(784.0)     migraine headache on occas, less now than when she was younger   . Arthritis     L hip, back, neck   . History of blood transfusion sept 2013    04/2011- /w ischemic colitis , trouble with matching blood  sept 2013  . Anemia     will see hematology consult prior to surgery, recommended by Dr.  Mare Ferrari  . Anemia 12/15/2010  . Ischemic colitis 01/31/2012  . Atrial flutter     during hospitalization, 04/2011- related to anemia & illness/stress   . Pneumonia     04/2011- not hospitalized , pt. denies SOB, changes in chest, breathing  . 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 Dizziness     occasional  . B12 deficiency 02/27/2013  . 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 COPD (chronic obstructive pulmonary disease) 09/26/2013   Past Surgical History  Procedure Laterality Date  . Knee surgery      I&D- 2008, post laceration   . Colonoscopy  05/16/2011    Procedure: COLONOSCOPY;  Surgeon: Winfield Cunas., MD;  Location: [facility];  Service: Endoscopy;  Laterality: N/A;  . Small intestine surgery  1992, 1999  . Laparotomy and lysis of adhesions    . Total hip arthroplasty  10/25/2011    Procedure: TOTAL HIP ARTHROPLASTY;  Surgeon: Garald Balding, MD;  Location: [place];  Service: Orthopedics;  Laterality: Left;  . Appendectomy  1962  . Abdominal hysterectomy  1988  . Partial colectomy and colostomy  sept 2013    mucous fistula done  . Partial colectomy  05/17/2012    Procedure:  PARTIAL COLECTOMY;  Surgeon: Odis Hollingshead, MD;  Location: [facility];  Service: General;  Laterality: N/A;  . Colostomy closure  05/17/2012    Procedure: COLOSTOMY CLOSURE;  Surgeon: Odis Hollingshead, MD;  Location: [facility];  Service: General;  Laterality: N/A;  . Laparotomy  05/17/2012    Procedure: EXPLORATORY LAPAROTOMY;  Surgeon: Odis Hollingshead, MD;  Location: [facility];  Service: General;;  . Lysis of adhesion  05/17/2012    Procedure: LYSIS OF ADHESION;  Surgeon: Odis Hollingshead, MD;  Location: [facility];  Service: General;;  . Esophageal biopsy  05/17/2012    Procedure: BIOPSY;  Surgeon: Odis Hollingshead, MD;  Location: [facility];  Service: General;;  omental biopsy  . Laparotomy  05/22/2012    Procedure: EXPLORATORY LAPAROTOMY;  Surgeon: Odis Hollingshead, MD;  Location: [facility];  Service: General;   Laterality: N/A;  DRAINAGE  INTRA-ABDOMINAL ABSCESS/LYSIS OF ADHESIONSFOR SMALL BOWEL OBSTRUCTION/DIVERTING LOOP ILEOSTOMY   Family History  Problem Relation Age of Onset  . Stroke Father   . Atrial fibrillation Father   . Hypertension Mother    History  Substance Use Topics  . Smoking status: Former Smoker -- 0.25 packs/day for 20 years    Types: Cigarettes    Quit date: 03/21/2013  . Smokeless tobacco: Never Used  . Alcohol Use: No     Comment: 1 - 2 glasses of wine per week   OB History   Grav Para Term Preterm Abortions TAB SAB Ect Mult Living                 Review of Systems  Constitutional: Positive for chills and fatigue. Negative for fever.  Respiratory: Positive for cough and shortness of breath. Negative for chest tightness and wheezing.   Cardiovascular: Negative for chest pain, palpitations and leg swelling.  Gastrointestinal: Positive for nausea. Negative for vomiting, abdominal pain, diarrhea, constipation and blood in stool.  Genitourinary: Negative for dysuria, urgency, frequency, hematuria and difficulty urinating.    See ROS above  Allergies  Vancomycin; Ativan; Codeine; Tetanus toxoids; Penicillins; and Xarelto  Home Medications   Prior to Admission medications   Medication Sig Start Date End Date Taking? Authorizing Provider  albuterol (PROVENTIL HFA;VENTOLIN HFA) 108 (90 BASE) MCG/ACT inhaler Inhale 1-2 puffs into the lungs every 6 (six) hours as needed for wheezing or shortness of breath.    Yes Historical Provider, MD  aspirin 325 MG EC tablet Take 325 mg by mouth every morning.    Yes Historical Provider, MD  bisoprolol (ZEBETA) 5 MG tablet Take 5 mg by mouth every morning.   Yes Historical Provider, MD  buPROPion (WELLBUTRIN XL) 300 MG 24 hr tablet Take 300 mg by mouth daily before breakfast.    Yes Historical Provider, MD  calcium carbonate (TUMS E-X 750) 750 MG chewable tablet Chew 1 tablet (750 mg total) by mouth 3 (three) times daily. 10/18/13   Yes Nishant Dhungel, MD  celecoxib (CELEBREX) 200 MG capsule Take 200 mg by mouth every morning.    Yes Historical Provider, MD  cyclobenzaprine (FLEXERIL) 10 MG tablet Take 10 mg by mouth 3 (three) times daily as needed for muscle spasms.  07/12/13  Yes Historical Provider, MD  diphenoxylate-atropine (LOMOTIL) 2.5-0.025 MG per tablet Take 1 tablet by mouth 4 (four) times daily. 11/01/13  Yes Odis Hollingshead, MD  esomeprazole (NEXIUM) 40 MG capsule Take 40 mg by mouth 2 (two) times daily before a meal.    Yes Historical Provider,  MD  estradiol (ESTRACE) 2 MG tablet Take 2 mg by mouth daily before breakfast.    Yes Historical Provider, MD  HYDROcodone-acetaminophen (NORCO) 10-325 MG per tablet Take 1 tablet by mouth every 4 (four) hours as needed for moderate pain.  07/13/13  Yes Historical Provider, MD  loperamide (IMODIUM) 2 MG capsule Take 2 mg by mouth 4 (four) times daily.    Yes Historical Provider, MD  loratadine-pseudoephedrine (CLARITIN-D 12-HOUR) 5-120 MG per tablet Take 1 tablet by mouth daily as needed for allergies.    Yes Historical Provider, MD  nicotine (NICODERM CQ - DOSED IN MG/24 HOURS) 14 mg/24hr patch Place 1 patch (14 mg total) onto the skin daily. 10/18/13  Yes Nishant Dhungel, MD  ondansetron (ZOFRAN) 4 MG tablet Take 4 mg by mouth every 8 (eight) hours as needed for nausea or vomiting.   Yes Historical Provider, MD  promethazine (PHENERGAN) 25 MG tablet Take 25 mg by mouth every 6 (six) hours as needed for nausea or vomiting (nausea).   Yes Historical Provider, MD   BP 98/74  Pulse 77  Temp(Src) 97.1 F (36.2 C) (Axillary)  Resp 18  SpO2 % Physical Exam  Nursing note and vitals reviewed. Constitutional: She is oriented to person, place, and time. She appears well-developed and well-nourished. No distress.  HENT:  Head: Normocephalic and atraumatic.  Mouth/Throat: Mucous membranes are pale and dry. No oropharyngeal exudate.  Eyes: Conjunctivae and EOM are normal. Pupils  are equal, round, and reactive to light. No scleral icterus.  Neck: Normal range of motion. Neck supple. No JVD present. No thyromegaly present.  Cardiovascular: Normal rate, regular rhythm, normal heart sounds and intact distal pulses.  Exam reveals no gallop and no friction rub.   No murmur heard. Pulmonary/Chest: Effort normal. No respiratory distress. She has no wheezes. She has rales. She exhibits no tenderness.  Abdominal: Soft. Bowel sounds are normal. She exhibits no distension and no mass. There is tenderness. There is no rebound and no guarding.  Central open abdominal wound 3 in x 2 in without drainage at this time.  There is no erythema or surrounding cellulitis around the wound.  There is a right sided ostomy.  There is soft yellow brown stool in ostomy bag.  Ostomy appears ruberous and viable at this time.  Musculoskeletal: Normal range of motion.  Lymphadenopathy:    She has no cervical adenopathy.  Neurological: She is alert and oriented to person, place, and time. No cranial nerve deficit. Coordination normal.  Skin: Skin is warm and dry. She is not diaphoretic.  Psychiatric: She has a normal mood and affect. Her behavior is normal. Judgment and thought content normal.    ED Course  Procedures (including critical care time) Labs Review Labs Reviewed  CBC WITH DIFFERENTIAL - Abnormal; Notable for the following:    WBC 19.7 (*)    RBC 3.03 (*)    Hemoglobin 10.1 (*)    HCT 29.7 (*)    RDW 19.7 (*)    Lymphocytes Relative 7 (*)    Monocytes Relative 27 (*)    Neutro Abs 13.0 (*)    Monocytes Absolute 5.3 (*)    All other components within normal limits  COMPREHENSIVE METABOLIC PANEL - Abnormal; Notable for the following:    Sodium 131 (*)    Potassium 5.6 (*)    CO2 9 (*)    Glucose, Bld 103 (*)    BUN 51 (*)    Creatinine, Ser 2.23 (*)  Calcium 6.8 (*)    Albumin 3.3 (*)    Alkaline Phosphatase 708 (*)    GFR calc non Af Amer 22 (*)    GFR calc Af Amer 26 (*)     Anion gap 21 (*)    All other components within normal limits  URINALYSIS, ROUTINE W REFLEX MICROSCOPIC - Abnormal; Notable for the following:    Color, Urine AMBER (*)    APPearance CLOUDY (*)    Hgb urine dipstick SMALL (*)    Bilirubin Urine SMALL (*)    Protein, ur 30 (*)    Nitrite POSITIVE (*)    Leukocytes, UA LARGE (*)    All other components within normal limits  URINE MICROSCOPIC-ADD ON - Abnormal; Notable for the following:    Bacteria, UA MANY (*)    Casts GRANULAR CAST (*)    All other components within normal limits  CULTURE, BLOOD (ROUTINE X 2)  CULTURE, BLOOD (ROUTINE X 2)    Imaging Review Dg Chest 2 View  11/18/2013   CLINICAL DATA:  Hypoxia  EXAM: CHEST  2 VIEW  COMPARISON:  October 15, 2013  FINDINGS: There is a 1.5 x 1.5 cm nodular opacity in the right lower lobe. There are scattered areas of mild scarring bilaterally. There is no edema or consolidation. Heart is upper normal in size with pulmonary vascularity within normal limits. No adenopathy. No bone lesions.  IMPRESSION: 1.5 x 1.5 cm nodular opacity right lower lobe. This finding warrants noncontrast enhanced chest CT to further evaluate.  No edema or consolidation.   Electronically Signed   By: Lowella Grip M.D.   On: 11/18/2013 21:10   Ct Chest Wo Contrast  11/18/2013   CLINICAL DATA:  Abnormal chest x-ray.  Rule out mass lesion  EXAM: CT CHEST WITHOUT CONTRAST  TECHNIQUE: Multidetector CT imaging of the chest was performed following the standard protocol without IV contrast.  COMPARISON:  Chest x-ray 11/18/2013  FINDINGS: Multiple ill-defined nodular densities in the right upper lobe and right lower lobe. Confluent infiltrate in the right posterior lung base is present consistent with pneumonia. Other nodules may also be infectious however tumor not excluded.  Mild patchy nodular infiltrates in the left upper lobe and left lower lobe.  Negative for pleural effusion. Negative for adenopathy. Esophagus is  dilated and fluid-filled. No acute bony change.  IMPRESSION: Numerous upper and lower lobe nodular densities bilaterally, right greater than left. Favor diffuse pneumonitis however metastatic disease cannot be excluded. Correlate with fever and white count. Followup CT after antibiotic treatment would be helpful to assure clearing.   Electronically Signed   By: Franchot Gallo M.D.   On: 11/18/2013 23:24     EKG Interpretation None      MDM   Final diagnoses:  UTI (lower urinary tract infection)  AKI (acute kidney injury)  Dehydration  Nausea   Patient is a 63 y.o. Immunocompromised female who presents to the ED with nausea and dizziness.  Patient clinically appears dry.  CBC shows leukocytosis and anemia at baseline at this time.  Blood cultures were drawn at this time and are currently pending.  CXR shows pulmonary nodules and Chest CT without contrast was ordered per recommendations of the radiologist which show possible malignancy vs. Pneumonitis.  CMP reveals elevated SCr, elevated alk phosphate, anion gap of 21 at this time.  Patient does have a UTI at this time which is health care associated due to recent admission on 10/18/13.  Patient was given  2 L NS bolus at this time, IV phenergan, and IV zofran with some relief.  Patient was started on aztreonam and levaquin here at this time.  Patient will be admitted to medsurg at this time by Dr. Doyle Askew at this time.  Patient has also been seen and evaluated by Dr. Kathrynn Humble who agrees with the above plan at this time.    Cherylann Parr, PA-C 11/19/13 [phone]

## 2013-11-18 NOTE — ED Notes (Signed)
Patient transported to X-ray 

## 2013-11-19 ENCOUNTER — Other Ambulatory Visit: Payer: BC Managed Care – PPO

## 2013-11-19 ENCOUNTER — Inpatient Hospital Stay (HOSPITAL_COMMUNITY): Payer: BC Managed Care – PPO

## 2013-11-19 ENCOUNTER — Ambulatory Visit: Payer: BC Managed Care – PPO

## 2013-11-19 DIAGNOSIS — R339 Retention of urine, unspecified: Secondary | ICD-10-CM | POA: Diagnosis present

## 2013-11-19 DIAGNOSIS — I4891 Unspecified atrial fibrillation: Secondary | ICD-10-CM | POA: Diagnosis present

## 2013-11-19 DIAGNOSIS — Z932 Ileostomy status: Secondary | ICD-10-CM | POA: Diagnosis not present

## 2013-11-19 DIAGNOSIS — I519 Heart disease, unspecified: Secondary | ICD-10-CM

## 2013-11-19 DIAGNOSIS — N390 Urinary tract infection, site not specified: Secondary | ICD-10-CM | POA: Diagnosis present

## 2013-11-19 DIAGNOSIS — Z823 Family history of stroke: Secondary | ICD-10-CM | POA: Diagnosis not present

## 2013-11-19 DIAGNOSIS — C9210 Chronic myeloid leukemia, BCR/ABL-positive, not having achieved remission: Secondary | ICD-10-CM | POA: Diagnosis present

## 2013-11-19 DIAGNOSIS — E876 Hypokalemia: Secondary | ICD-10-CM | POA: Diagnosis present

## 2013-11-19 DIAGNOSIS — J189 Pneumonia, unspecified organism: Secondary | ICD-10-CM

## 2013-11-19 DIAGNOSIS — E871 Hypo-osmolality and hyponatremia: Secondary | ICD-10-CM | POA: Diagnosis present

## 2013-11-19 DIAGNOSIS — E669 Obesity, unspecified: Secondary | ICD-10-CM | POA: Diagnosis present

## 2013-11-19 DIAGNOSIS — R0603 Acute respiratory distress: Secondary | ICD-10-CM | POA: Diagnosis present

## 2013-11-19 DIAGNOSIS — E861 Hypovolemia: Secondary | ICD-10-CM | POA: Diagnosis present

## 2013-11-19 DIAGNOSIS — Z22322 Carrier or suspected carrier of Methicillin resistant Staphylococcus aureus: Secondary | ICD-10-CM | POA: Diagnosis not present

## 2013-11-19 DIAGNOSIS — R918 Other nonspecific abnormal finding of lung field: Secondary | ICD-10-CM | POA: Diagnosis not present

## 2013-11-19 DIAGNOSIS — R531 Weakness: Secondary | ICD-10-CM | POA: Diagnosis present

## 2013-11-19 DIAGNOSIS — IMO0002 Reserved for concepts with insufficient information to code with codable children: Secondary | ICD-10-CM | POA: Diagnosis not present

## 2013-11-19 DIAGNOSIS — K219 Gastro-esophageal reflux disease without esophagitis: Secondary | ICD-10-CM | POA: Diagnosis present

## 2013-11-19 DIAGNOSIS — E875 Hyperkalemia: Secondary | ICD-10-CM | POA: Diagnosis present

## 2013-11-19 DIAGNOSIS — E872 Acidosis, unspecified: Secondary | ICD-10-CM | POA: Diagnosis present

## 2013-11-19 DIAGNOSIS — D72829 Elevated white blood cell count, unspecified: Secondary | ICD-10-CM | POA: Diagnosis present

## 2013-11-19 DIAGNOSIS — E860 Dehydration: Secondary | ICD-10-CM | POA: Diagnosis present

## 2013-11-19 DIAGNOSIS — Z96649 Presence of unspecified artificial hip joint: Secondary | ICD-10-CM | POA: Diagnosis not present

## 2013-11-19 DIAGNOSIS — D899 Disorder involving the immune mechanism, unspecified: Secondary | ICD-10-CM | POA: Diagnosis present

## 2013-11-19 DIAGNOSIS — N170 Acute kidney failure with tubular necrosis: Secondary | ICD-10-CM | POA: Diagnosis present

## 2013-11-19 DIAGNOSIS — R6520 Severe sepsis without septic shock: Secondary | ICD-10-CM | POA: Diagnosis present

## 2013-11-19 DIAGNOSIS — N179 Acute kidney failure, unspecified: Secondary | ICD-10-CM

## 2013-11-19 DIAGNOSIS — D638 Anemia in other chronic diseases classified elsewhere: Secondary | ICD-10-CM | POA: Diagnosis present

## 2013-11-19 DIAGNOSIS — I10 Essential (primary) hypertension: Secondary | ICD-10-CM | POA: Diagnosis present

## 2013-11-19 DIAGNOSIS — A419 Sepsis, unspecified organism: Secondary | ICD-10-CM | POA: Diagnosis present

## 2013-11-19 DIAGNOSIS — R6510 Systemic inflammatory response syndrome (SIRS) of non-infectious origin without acute organ dysfunction: Secondary | ICD-10-CM | POA: Diagnosis not present

## 2013-11-19 DIAGNOSIS — Z791 Long term (current) use of non-steroidal anti-inflammatories (NSAID): Secondary | ICD-10-CM | POA: Diagnosis not present

## 2013-11-19 DIAGNOSIS — Z79899 Other long term (current) drug therapy: Secondary | ICD-10-CM | POA: Diagnosis not present

## 2013-11-19 DIAGNOSIS — J9600 Acute respiratory failure, unspecified whether with hypoxia or hypercapnia: Secondary | ICD-10-CM | POA: Diagnosis present

## 2013-11-19 DIAGNOSIS — Z8614 Personal history of Methicillin resistant Staphylococcus aureus infection: Secondary | ICD-10-CM | POA: Diagnosis not present

## 2013-11-19 DIAGNOSIS — Z7982 Long term (current) use of aspirin: Secondary | ICD-10-CM | POA: Diagnosis not present

## 2013-11-19 DIAGNOSIS — R062 Wheezing: Secondary | ICD-10-CM | POA: Diagnosis present

## 2013-11-19 DIAGNOSIS — R799 Abnormal finding of blood chemistry, unspecified: Secondary | ICD-10-CM | POA: Diagnosis not present

## 2013-11-19 DIAGNOSIS — Z8249 Family history of ischemic heart disease and other diseases of the circulatory system: Secondary | ICD-10-CM | POA: Diagnosis not present

## 2013-11-19 DIAGNOSIS — J984 Other disorders of lung: Secondary | ICD-10-CM

## 2013-11-19 DIAGNOSIS — R911 Solitary pulmonary nodule: Secondary | ICD-10-CM | POA: Diagnosis not present

## 2013-11-19 DIAGNOSIS — E43 Unspecified severe protein-calorie malnutrition: Secondary | ICD-10-CM | POA: Diagnosis present

## 2013-11-19 DIAGNOSIS — A4902 Methicillin resistant Staphylococcus aureus infection, unspecified site: Secondary | ICD-10-CM | POA: Diagnosis present

## 2013-11-19 DIAGNOSIS — F17200 Nicotine dependence, unspecified, uncomplicated: Secondary | ICD-10-CM | POA: Diagnosis present

## 2013-11-19 DIAGNOSIS — J449 Chronic obstructive pulmonary disease, unspecified: Secondary | ICD-10-CM | POA: Diagnosis present

## 2013-11-19 DIAGNOSIS — Z9049 Acquired absence of other specified parts of digestive tract: Secondary | ICD-10-CM | POA: Diagnosis not present

## 2013-11-19 DIAGNOSIS — K912 Postsurgical malabsorption, not elsewhere classified: Secondary | ICD-10-CM | POA: Diagnosis present

## 2013-11-19 LAB — BASIC METABOLIC PANEL
ANION GAP: 18 — AB (ref 5–15)
Anion gap: 18 — ABNORMAL HIGH (ref 5–15)
BUN: 46 mg/dL — AB (ref 6–23)
BUN: 34 mg/dL — ABNORMAL HIGH (ref 6–23)
CALCIUM: 5.8 mg/dL — AB (ref 8.4–10.5)
CALCIUM: 5.4 mg/dL — AB (ref 8.4–10.5)
CO2: 9 mEq/L — CL (ref 19–32)
CO2: 14 meq/L — AB (ref 19–32)
Chloride: 105 mEq/L (ref 96–112)
Chloride: 102 mEq/L (ref 96–112)
Creatinine, Ser: 1.8 mg/dL — ABNORMAL HIGH (ref 0.50–1.10)
Creatinine, Ser: 1.29 mg/dL — ABNORMAL HIGH (ref 0.50–1.10)
GFR calc Af Amer: 50 mL/min — ABNORMAL LOW (ref >90)
GFR, EST AFRICAN AMERICAN: 33 mL/min — AB (ref >90)
GFR, EST NON AFRICAN AMERICAN: 29 mL/min — AB (ref >90)
GFR, EST NON AFRICAN AMERICAN: 43 mL/min — AB (ref >90)
GLUCOSE: 89 mg/dL (ref 70–99)
Glucose, Bld: 173 mg/dL — ABNORMAL HIGH (ref 70–99)
Potassium: 5 mEq/L (ref 3.7–5.3)
Potassium: 3.4 mEq/L — ABNORMAL LOW (ref 3.7–5.3)
SODIUM: 132 meq/L — AB (ref 137–147)
Sodium: 134 mEq/L — ABNORMAL LOW (ref 137–147)

## 2013-11-19 LAB — SALICYLATE LEVEL

## 2013-11-19 LAB — PHOSPHORUS: PHOSPHORUS: 4.2 mg/dL (ref 2.3–4.6)

## 2013-11-19 LAB — HEPATIC FUNCTION PANEL
ALT: 8 U/L (ref 0–35)
AST: 12 U/L (ref 0–37)
Albumin: 2.7 g/dL — ABNORMAL LOW (ref 3.5–5.2)
Alkaline Phosphatase: 588 U/L — ABNORMAL HIGH (ref 39–117)
BILIRUBIN TOTAL: 0.4 mg/dL (ref 0.3–1.2)
Bilirubin, Direct: <0.2 mg/dL (ref 0.0–0.3)
Total Protein: 6.7 g/dL (ref 6.0–8.3)

## 2013-11-19 LAB — BLOOD GAS, ARTERIAL
Acid-base deficit: 19.9 mmol/L — ABNORMAL HIGH (ref 0.0–2.0)
Bicarbonate: 6.8 mEq/L — ABNORMAL LOW (ref 20.0–24.0)
DRAWN BY: 11249
O2 CONTENT: 3 L/min
O2 Saturation: 97.8 %
PH ART: 7.205 — AB (ref 7.350–7.450)
Patient temperature: 97.4
TCO2: 6.6 mmol/L (ref 0–100)
pCO2 arterial: 17.7 mmHg — CL (ref 35.0–45.0)
pO2, Arterial: 124 mmHg — ABNORMAL HIGH (ref 80.0–100.0)

## 2013-11-19 LAB — STREP PNEUMONIAE URINARY ANTIGEN: Strep Pneumo Urinary Antigen: NEGATIVE

## 2013-11-19 LAB — CBC
HCT: 25.1 % — ABNORMAL LOW (ref 36.0–46.0)
Hemoglobin: 8.4 g/dL — ABNORMAL LOW (ref 12.0–15.0)
MCH: 32.3 pg (ref 26.0–34.0)
MCHC: 33.5 g/dL (ref 30.0–36.0)
MCV: 96.5 fL (ref 78.0–100.0)
PLATELETS: 222 10*3/uL (ref 150–400)
RBC: 2.6 MIL/uL — AB (ref 3.87–5.11)
RDW: 19.3 % — ABNORMAL HIGH (ref 11.5–15.5)
WBC: 14.9 10*3/uL — AB (ref 4.0–10.5)

## 2013-11-19 LAB — FOLATE: Folate: 13 ng/mL

## 2013-11-19 LAB — FERRITIN: FERRITIN: 482 ng/mL — AB (ref 10–291)

## 2013-11-19 LAB — MRSA PCR SCREENING: MRSA BY PCR: POSITIVE — AB

## 2013-11-19 LAB — HIV ANTIBODY (ROUTINE TESTING W REFLEX): HIV 1&2 Ab, 4th Generation: NONREACTIVE

## 2013-11-19 LAB — TROPONIN I
Troponin I: <0.3 ng/mL (ref <0.30)
Troponin I: <0.3 ng/mL (ref <0.30)
Troponin I: <0.3 ng/mL (ref <0.30)

## 2013-11-19 LAB — MAGNESIUM: Magnesium: 0.7 mg/dL — CL (ref 1.5–2.5)

## 2013-11-19 LAB — LEGIONELLA ANTIGEN, URINE: Legionella Antigen, Urine: NEGATIVE

## 2013-11-19 LAB — VITAMIN D 25 HYDROXY (VIT D DEFICIENCY, FRACTURES): VIT D 25 HYDROXY: 14 ng/mL — AB (ref 30–89)

## 2013-11-19 LAB — PREALBUMIN: PREALBUMIN: 11.8 mg/dL — AB (ref 17.0–34.0)

## 2013-11-19 LAB — ACETAMINOPHEN LEVEL

## 2013-11-19 LAB — CREATININE, URINE, RANDOM: Creatinine, Urine: 74.2 mg/dL

## 2013-11-19 LAB — VITAMIN B12: VITAMIN B 12: 536 pg/mL (ref 211–911)

## 2013-11-19 LAB — LACTIC ACID, PLASMA
LACTIC ACID, VENOUS: 0.8 mmol/L (ref 0.5–2.2)
Lactic Acid, Venous: 0.8 mmol/L (ref 0.5–2.2)

## 2013-11-19 LAB — SODIUM, URINE, RANDOM: SODIUM UR: 17 meq/L

## 2013-11-19 LAB — PROCALCITONIN: PROCALCITONIN: 0.22 ng/mL

## 2013-11-19 LAB — CALCIUM, IONIZED: Calcium, Ion: 0.88 mmol/L — ABNORMAL LOW (ref 1.13–1.30)

## 2013-11-19 LAB — ETHANOL

## 2013-11-19 LAB — CORTISOL: Cortisol, Plasma: 29.5 ug/dL

## 2013-11-19 MED ORDER — PROMETHAZINE HCL 25 MG/ML IJ SOLN
12.5000 mg | Freq: Four times a day (QID) | INTRAMUSCULAR | Status: DC | PRN
  Administered 2013-11-19 – 2013-11-21 (×5): 12.5 mg via INTRAVENOUS
  Administered 2013-11-21: 25 mg via INTRAVENOUS
  Administered 2013-11-21 – 2013-11-23 (×6): 12.5 mg via INTRAVENOUS
  Administered 2013-11-24 (×2): 25 mg via INTRAVENOUS
  Filled 2013-11-19 (×14): qty 1

## 2013-11-19 MED ORDER — SODIUM CHLORIDE 0.9 % IV SOLN
INTRAVENOUS | Status: DC
  Administered 2013-11-19: 02:00:00 via INTRAVENOUS

## 2013-11-19 MED ORDER — ASPIRIN EC 325 MG PO TBEC
325.0000 mg | DELAYED_RELEASE_TABLET | Freq: Every morning | ORAL | Status: DC
  Administered 2013-11-19 – 2013-11-27 (×7): 325 mg via ORAL
  Filled 2013-11-19 (×11): qty 1

## 2013-11-19 MED ORDER — SODIUM CHLORIDE 0.9 % IV BOLUS (SEPSIS)
750.0000 mL | Freq: Once | INTRAVENOUS | Status: AC
  Administered 2013-11-19: 750 mL via INTRAVENOUS

## 2013-11-19 MED ORDER — SODIUM CHLORIDE 0.9 % IV BOLUS (SEPSIS)
1000.0000 mL | Freq: Once | INTRAVENOUS | Status: AC
  Administered 2013-11-19: 1000 mL via INTRAVENOUS

## 2013-11-19 MED ORDER — CYCLOBENZAPRINE HCL 10 MG PO TABS
10.0000 mg | ORAL_TABLET | Freq: Three times a day (TID) | ORAL | Status: DC | PRN
  Filled 2013-11-19: qty 1

## 2013-11-19 MED ORDER — POTASSIUM CHLORIDE 10 MEQ/50ML IV SOLN
10.0000 meq | INTRAVENOUS | Status: AC
  Administered 2013-11-19 – 2013-11-20 (×3): 10 meq via INTRAVENOUS
  Filled 2013-11-19: qty 50

## 2013-11-19 MED ORDER — LEVOFLOXACIN IN D5W 750 MG/150ML IV SOLN
750.0000 mg | INTRAVENOUS | Status: DC
  Administered 2013-11-20: 750 mg via INTRAVENOUS
  Filled 2013-11-19: qty 150

## 2013-11-19 MED ORDER — IPRATROPIUM-ALBUTEROL 0.5-2.5 (3) MG/3ML IN SOLN
3.0000 mL | Freq: Four times a day (QID) | RESPIRATORY_TRACT | Status: DC
  Administered 2013-11-19 – 2013-11-20 (×6): 3 mL via RESPIRATORY_TRACT
  Filled 2013-11-19 (×7): qty 3

## 2013-11-19 MED ORDER — HYDROMORPHONE HCL PF 1 MG/ML IJ SOLN
1.0000 mg | INTRAMUSCULAR | Status: DC | PRN
  Administered 2013-11-19 (×3): 1 mg via INTRAVENOUS
  Filled 2013-11-19 (×3): qty 1

## 2013-11-19 MED ORDER — ENOXAPARIN SODIUM 40 MG/0.4ML ~~LOC~~ SOLN
40.0000 mg | SUBCUTANEOUS | Status: DC
  Administered 2013-11-19 – 2013-11-27 (×9): 40 mg via SUBCUTANEOUS
  Filled 2013-11-19 (×11): qty 0.4

## 2013-11-19 MED ORDER — NICOTINE 14 MG/24HR TD PT24
14.0000 mg | MEDICATED_PATCH | Freq: Every day | TRANSDERMAL | Status: DC
  Administered 2013-11-19 – 2013-11-27 (×9): 14 mg via TRANSDERMAL
  Filled 2013-11-19 (×10): qty 1

## 2013-11-19 MED ORDER — DIPHENOXYLATE-ATROPINE 2.5-0.025 MG PO TABS: 1.0000 | ORAL_TABLET | Freq: Four times a day (QID) | ORAL | Status: DC

## 2013-11-19 MED ORDER — BUPROPION HCL ER (XL) 300 MG PO TB24
300.0000 mg | ORAL_TABLET | Freq: Every day | ORAL | Status: DC
  Administered 2013-11-19 – 2013-11-27 (×7): 300 mg via ORAL
  Filled 2013-11-19 (×10): qty 1

## 2013-11-19 MED ORDER — ONDANSETRON HCL 4 MG PO TABS: 4.0000 mg | ORAL_TABLET | Freq: Three times a day (TID) | ORAL | Status: DC | PRN

## 2013-11-19 MED ORDER — AZTREONAM 2 G IJ SOLR
2.0000 g | Freq: Once | INTRAMUSCULAR | Status: DC
  Filled 2013-11-19: qty 2

## 2013-11-19 MED ORDER — CETYLPYRIDINIUM CHLORIDE 0.05 % MT LIQD
7.0000 mL | Freq: Two times a day (BID) | OROMUCOSAL | Status: DC
  Administered 2013-11-20 – 2013-11-27 (×9): 7 mL via OROMUCOSAL

## 2013-11-19 MED ORDER — MUPIROCIN 2 % EX OINT
1.0000 "application " | TOPICAL_OINTMENT | Freq: Two times a day (BID) | CUTANEOUS | Status: AC
  Administered 2013-11-19 – 2013-11-23 (×10): 1 via NASAL
  Filled 2013-11-19: qty 22

## 2013-11-19 MED ORDER — PROMETHAZINE HCL 25 MG PO TABS
25.0000 mg | ORAL_TABLET | Freq: Four times a day (QID) | ORAL | Status: DC | PRN
  Administered 2013-11-19: 25 mg via ORAL
  Filled 2013-11-19: qty 1

## 2013-11-19 MED ORDER — SODIUM BICARBONATE 8.4 % IV SOLN
INTRAVENOUS | Status: DC
  Administered 2013-11-19 – 2013-11-20 (×3): via INTRAVENOUS
  Filled 2013-11-19 (×11): qty 1000

## 2013-11-19 MED ORDER — PANTOPRAZOLE SODIUM 40 MG IV SOLR
40.0000 mg | Freq: Every day | INTRAVENOUS | Status: DC
  Administered 2013-11-20 – 2013-11-26 (×7): 40 mg via INTRAVENOUS
  Filled 2013-11-19 (×8): qty 40

## 2013-11-19 MED ORDER — CELECOXIB 200 MG PO CAPS
200.0000 mg | ORAL_CAPSULE | Freq: Every morning | ORAL | Status: DC
  Filled 2013-11-19: qty 1

## 2013-11-19 MED ORDER — MAGNESIUM SULFATE 40 MG/ML IJ SOLN
2.0000 g | Freq: Once | INTRAMUSCULAR | Status: AC
Start: 2013-11-19 — End: 2013-11-19
  Administered 2013-11-19: 2 g via INTRAVENOUS
  Filled 2013-11-19: qty 50

## 2013-11-19 MED ORDER — ESTRADIOL 2 MG PO TABS
2.0000 mg | ORAL_TABLET | Freq: Every day | ORAL | Status: DC
  Administered 2013-11-19 – 2013-11-27 (×7): 2 mg via ORAL
  Filled 2013-11-19 (×10): qty 1

## 2013-11-19 MED ORDER — ALBUTEROL SULFATE (2.5 MG/3ML) 0.083% IN NEBU: 2.5000 mg | INHALATION_SOLUTION | RESPIRATORY_TRACT | Status: DC | PRN

## 2013-11-19 MED ORDER — CALCIUM CITRATE 950 (200 CA) MG PO TABS
500.0000 mg | ORAL_TABLET | Freq: Four times a day (QID) | ORAL | Status: DC
  Administered 2013-11-19 – 2013-11-23 (×14): 500 mg via ORAL
  Administered 2013-11-23: 475 mg via ORAL
  Administered 2013-11-23 – 2013-11-27 (×13): 500 mg via ORAL
  Filled 2013-11-19 (×37): qty 3

## 2013-11-19 MED ORDER — ALBUTEROL SULFATE HFA 108 (90 BASE) MCG/ACT IN AERS: 1.0000 | INHALATION_SPRAY | Freq: Four times a day (QID) | RESPIRATORY_TRACT | Status: DC | PRN

## 2013-11-19 MED ORDER — FENTANYL CITRATE 0.05 MG/ML IJ SOLN
50.0000 ug | Freq: Once | INTRAMUSCULAR | Status: AC
  Administered 2013-11-19: 50 ug via INTRAVENOUS
  Filled 2013-11-19: qty 2

## 2013-11-19 MED ORDER — DEXTROSE 5 % IV SOLN
2.0000 g | Freq: Once | INTRAVENOUS | Status: DC
  Filled 2013-11-19: qty 2

## 2013-11-19 MED ORDER — ALBUTEROL SULFATE (2.5 MG/3ML) 0.083% IN NEBU
2.5000 mg | INHALATION_SOLUTION | Freq: Four times a day (QID) | RESPIRATORY_TRACT | Status: DC | PRN
Start: 2013-11-19 — End: 2013-11-19

## 2013-11-19 MED ORDER — SODIUM BICARBONATE 650 MG PO TABS
650.0000 mg | ORAL_TABLET | Freq: Three times a day (TID) | ORAL | Status: DC
  Administered 2013-11-19: 650 mg via ORAL
  Filled 2013-11-19: qty 1

## 2013-11-19 MED ORDER — CHLORHEXIDINE GLUCONATE CLOTH 2 % EX PADS
6.0000 | MEDICATED_PAD | Freq: Every day | CUTANEOUS | Status: AC
  Administered 2013-11-19 – 2013-11-23 (×5): 6 via TOPICAL

## 2013-11-19 MED ORDER — METHYLPREDNISOLONE SODIUM SUCC 125 MG IJ SOLR
60.0000 mg | Freq: Two times a day (BID) | INTRAMUSCULAR | Status: DC
  Filled 2013-11-19 (×3): qty 0.96

## 2013-11-19 MED ORDER — HYDROMORPHONE HCL PF 2 MG/ML IJ SOLN: 2.0000 mg | INTRAMUSCULAR | Status: DC | PRN

## 2013-11-19 MED ORDER — CHLORHEXIDINE GLUCONATE 0.12 % MT SOLN
15.0000 mL | Freq: Two times a day (BID) | OROMUCOSAL | Status: DC
  Administered 2013-11-19 – 2013-11-27 (×14): 15 mL via OROMUCOSAL
  Filled 2013-11-19 (×18): qty 15

## 2013-11-19 MED ORDER — ONDANSETRON HCL 4 MG/2ML IJ SOLN
4.0000 mg | Freq: Four times a day (QID) | INTRAMUSCULAR | Status: DC | PRN
  Administered 2013-11-19 – 2013-11-26 (×16): 4 mg via INTRAVENOUS
  Filled 2013-11-19 (×16): qty 2

## 2013-11-19 MED ORDER — VITAMIN D3 25 MCG (1000 UNIT) PO TABS
1000.0000 [IU] | ORAL_TABLET | Freq: Every day | ORAL | Status: DC
  Administered 2013-11-19 – 2013-11-27 (×7): 1000 [IU] via ORAL
  Filled 2013-11-19 (×8): qty 1

## 2013-11-19 MED ORDER — AZTREONAM 1 G IJ SOLR
1.0000 g | Freq: Three times a day (TID) | INTRAMUSCULAR | Status: DC
  Administered 2013-11-19 – 2013-11-25 (×17): 1 g via INTRAVENOUS
  Filled 2013-11-19 (×21): qty 1

## 2013-11-19 MED ORDER — SODIUM CHLORIDE 0.9 % IV SOLN
1.0000 g | Freq: Once | INTRAVENOUS | Status: AC
  Administered 2013-11-19: 1 g via INTRAVENOUS
  Filled 2013-11-19: qty 10

## 2013-11-19 MED ORDER — HYDROMORPHONE HCL PF 1 MG/ML IJ SOLN
1.0000 mg | INTRAMUSCULAR | Status: DC | PRN
  Administered 2013-11-19 – 2013-11-25 (×17): 1 mg via INTRAVENOUS
  Administered 2013-11-25: 0.5 mg via INTRAVENOUS
  Administered 2013-11-25 – 2013-11-26 (×4): 1 mg via INTRAVENOUS
  Filled 2013-11-19 (×23): qty 1

## 2013-11-19 MED ORDER — PANTOPRAZOLE SODIUM 40 MG PO TBEC
40.0000 mg | DELAYED_RELEASE_TABLET | Freq: Every day | ORAL | Status: DC
  Filled 2013-11-19: qty 1

## 2013-11-19 MED ORDER — PROCHLORPERAZINE EDISYLATE 5 MG/ML IJ SOLN
10.0000 mg | Freq: Four times a day (QID) | INTRAMUSCULAR | Status: DC | PRN
  Administered 2013-11-20: 10 mg via INTRAVENOUS
  Filled 2013-11-19 (×2): qty 2

## 2013-11-19 MED ORDER — HYDROCODONE-ACETAMINOPHEN 10-325 MG PO TABS
1.0000 | ORAL_TABLET | ORAL | Status: DC | PRN
  Administered 2013-11-19: 1 via ORAL
  Filled 2013-11-19: qty 1

## 2013-11-19 MED ORDER — LEVOFLOXACIN IN D5W 750 MG/150ML IV SOLN
750.0000 mg | Freq: Once | INTRAVENOUS | Status: AC
  Administered 2013-11-19: 750 mg via INTRAVENOUS
  Filled 2013-11-19: qty 150

## 2013-11-19 NOTE — Progress Notes (Signed)
[facility] PROGRESS NOTE  [name] GMW:[id number] DOB: 16-Dec-1950 DOA: 11/18/2013 PCP: Darlin Coco, MD  Assessment/Plan: #1 acute respiratory distress Likely multifactorial secondary to probable community acquired pneumonia/pneumonitis. Patient per ABG noted to be in metabolic acidosis. Patient also with a productive cough. Sputum Gram stain and culture pending. Blood cultures are pending. Repeat lactic acid level. Check a pro calcitonin level. Patient currently on nasal cannula. Continue empiric IV aztreonam and IV Levaquin. Will consult with the critical care medicine for further evaluation and management.   #2 severe sepsis likely secondary to pneumonia and UTI Patient is currently afebrile. WBC is trending down. Blood cultures are pending. Urine cultures are pending. Repeat lactic acid level. Check a pro calcitonin level. CT of the chest with probable pneumonitis/community acquired pneumonia. Will add bicarbonate to IV fluids. Continue empiric IV aztreonam and IV Levaquin. Consult with critical care medicine for further evaluation and management.  #3 severe metabolic acidosis Questionable etiology. May be secondary to GI losses via ileostomy. Patient is not in DKA. Patient denies any excess Tylenol or salicylate ingestion. Patient denies any ethylene glycol ingestion. Will check a lactic acid level. Check a pro calcitonin level. Check a salicylate level. Check a Tylenol level. Renal function improving. Patient has been pancultured and cultures are pending. Will add bicarbonate to IV fluids. Continue empiric IV antibiotics. Follow.  #4 community acquired pneumonia Patient with history of CML likely immunocompromise. Patient has been pancultured and cultures are pending. Continue empiric IV aztreonam and IV Levaquin.  #5 hypocalcemia May be secondary to vitamin D deficiency. Patient is asymptomatic. Check a PTH. Check a ionized calcium. Check a magnesium level. Check a phosphorus  level. Will replace calcium. Follow.  #6 acute renal failure Likely secondary to prerenal azotemia. Urinalysis with 30 protein. Check a urine sodium. Check a urine creatinine. Bladder scan noted to have 600 cc of urine. Place a Foley catheter. Continue hydration with IV fluids. Follow.  #6 urinary tract infection Urine cultures are pending. Continue IV Levaquin.  #7 leukocytosis Likely secondary to community-acquired pneumonia and UTI. Blood cultures are pending. Urine cultures are pending. Sputum Gram stain and cultures are pending. Urine Legionella antigen is pending. Urine pneumococcus antigen is pending. WBC is trending down. Continue empiric IV aztreonam and IV Levaquin. Follow.  #8 anemia Patient with no overt GI bleed. Check an anemia panel. Follow H&H. Transfusion threshold hemoglobin less than 7.  #9 lung nodules noted on CT scan Will need repeat CT scan once acute infection/pneumonia has been treated in a few months to assure clearing.  #10 COPD Stable. Patient with no wheezing. Continue bronchodilators. DC IV steroids. Follow.  #11 severe malnutrition Consult with dietitian. Place on initial.  #12 CML Will inform oncology of admission.  #13 prophylaxis PPI for GI prophylaxis. Lovenox for DVT prophylaxis.  Code Status: Full Family Communication: Updated patient and daughter at bedside. Disposition Plan: Transfer to ICU.   Consultants:  PCCM  Steve Minor, NP 11/19/2013  Procedures:  CT Chest 11/18/13  Chest x-ray 11/18/2013  Central line pending  Antibiotics:  IV Levaquin 11/19/2013  IV Aztreonam 11/19/13  HPI/Subjective: Patient states she is sick. Patient with some shortness of breath. Patient denies any chest pain. Patient with a cough.  Objective: Filed Vitals:   11/19/13 0752  BP:   Pulse:   Temp: 97.6 F (36.4 C)  Resp:     Intake/Output Summary (Last 24 hours) at 11/19/13 0900 Last data filed at 11/19/13 0500  Gross per 24 hour  Intake   217.5 ml  Output      0 ml  Net  217.5 ml   Filed Weights   11/19/13 0420 11/19/13 0500  Weight: 60 kg (132 lb 4.4 oz) 53.6 kg (118 lb 2.7 oz)    Exam:   General:  NAD. Speaking in full sentences. Extremely dry mucous membranes.  Cardiovascular: RRR no m/r/g  Respiratory: Some scattered coarse breath sounds diffusely. No wheezing.  Abdomen: Soft, ileostomy in place with loose stool, positive bowel sounds, nondistended, nontender. 4 X 4 over \\mid  abdominal area with a healing scar.  Musculoskeletal: No clubbing cyanosis or edema.  Data Reviewed: Basic Metabolic Panel:  Recent Labs Lab 11/18/13 1945 11/19/13 0615  NA 131* 132*  K 5.6* 5.0  CL 101 105  CO2 9* 9*  GLUCOSE 103* 89  BUN 51* 46*  CREATININE 2.23* 1.80*  CALCIUM 6.8* 5.8*   Liver Function Tests:  Recent Labs Lab 11/18/13 1945  AST 13  ALT 9  ALKPHOS 708*  BILITOT 0.4  PROT 8.0  ALBUMIN 3.3*   No results found for this basename: LIPASE, AMYLASE,  in the last 168 hours No results found for this basename: AMMONIA,  in the last 168 hours CBC:  Recent Labs Lab 11/18/13 1945 11/19/13 0615  WBC 19.7* 14.9*  NEUTROABS 13.0*  --   HGB 10.1* 8.4*  HCT 29.7* 25.1*  MCV 98.0 96.5  PLT 315 222   Cardiac Enzymes: No results found for this basename: CKTOTAL, CKMB, CKMBINDEX, TROPONINI,  in the last 168 hours BNP (last 3 results)  Recent Labs  03/24/13 2031 03/25/13 0334 03/26/13 0414  PROBNP 9254.0* 9403.0* 4535.0*   CBG: No results found for this basename: GLUCAP,  in the last 168 hours  Recent Results (from the past 240 hour(s))  MRSA PCR SCREENING     Status: Abnormal   Collection Time    11/19/13  2:45 AM      Result Value Ref Range Status   MRSA by PCR POSITIVE (*) NEGATIVE Final   Comment:            The GeneXpert MRSA Assay (FDA     approved for NASAL specimens     only), is one component of a     comprehensive MRSA colonization     surveillance program. It is not      intended to diagnose MRSA     infection nor to guide or     monitor treatment for     MRSA infections.     RESULT CALLED TO, READ BACK BY AND VERIFIED WITH:     JOHNSON RN AT 0510 ON 08.04.15 BY SHUEA     Studies: Dg Chest 2 View  11/18/2013   CLINICAL DATA:  Hypoxia  EXAM: CHEST  2 VIEW  COMPARISON:  October 15, 2013  FINDINGS: There is a 1.5 x 1.5 cm nodular opacity in the right lower lobe. There are scattered areas of mild scarring bilaterally. There is no edema or consolidation. Heart is upper normal in size with pulmonary vascularity within normal limits. No adenopathy. No bone lesions.  IMPRESSION: 1.5 x 1.5 cm nodular opacity right lower lobe. This finding warrants noncontrast enhanced chest CT to further evaluate.  No edema or consolidation.   Electronically Signed   By: Lowella Grip M.D.   On: 11/18/2013 21:10   Ct Chest Wo Contrast  11/18/2013   CLINICAL DATA:  Abnormal chest x-ray.  Rule out mass lesion  EXAM: CT  CHEST WITHOUT CONTRAST  TECHNIQUE: Multidetector CT imaging of the chest was performed following the standard protocol without IV contrast.  COMPARISON:  Chest x-ray 11/18/2013  FINDINGS: Multiple ill-defined nodular densities in the right upper lobe and right lower lobe. Confluent infiltrate in the right posterior lung base is present consistent with pneumonia. Other nodules may also be infectious however tumor not excluded.  Mild patchy nodular infiltrates in the left upper lobe and left lower lobe.  Negative for pleural effusion. Negative for adenopathy. Esophagus is dilated and fluid-filled. No acute bony change.  IMPRESSION: Numerous upper and lower lobe nodular densities bilaterally, right greater than left. Favor diffuse pneumonitis however metastatic disease cannot be excluded. Correlate with fever and white count. Followup CT after antibiotic treatment would be helpful to assure clearing.   Electronically Signed   By: Franchot Gallo M.D.   On: 11/18/2013 23:24     Scheduled Meds: . aspirin  325 mg Oral q morning - 10a  . aztreonam  1 g Intravenous Q8H  . aztreonam  2 g Intravenous Once  . buPROPion  300 mg Oral QAC breakfast  . calcium citrate  500 mg of elemental calcium Oral QID  . cholecalciferol  1,000 Units Oral Daily  . enoxaparin (LOVENOX) injection  40 mg Subcutaneous Q24H  . estradiol  2 mg Oral QAC breakfast  . ipratropium-albuterol  3 mL Nebulization QID  . [START ON 11/20/2013] levofloxacin (LEVAQUIN) IV  750 mg Intravenous Q48H  . methylPREDNISolone (SOLU-MEDROL) injection  60 mg Intravenous Q12H  . nicotine  14 mg Transdermal Daily  . pantoprazole (PROTONIX) IV  40 mg Intravenous Daily  . sodium bicarbonate  650 mg Oral TID   Continuous Infusions: . dextrose 5 % 1,000 mL with sodium bicarbonate 150 mEq infusion      Principal Problem:   Acute respiratory distress Active Problems:   Metabolic acidosis   Severe sepsis   ARF (acute renal failure)   Dehydration   Hypotension   Ileostomy status   Hypocalcemia   Anemia of chronic disease   UTI (lower urinary tract infection)   Weakness   UTI (urinary tract infection)   Urinary retention   CAP (community acquired pneumonia)   Leukocytosis, unspecified    Time spent: [facility] Pager [phone]. If 7PM-7AM, please contact night-coverage at [email or web address], password [facility] 11/19/2013, 9:00 AM  LOS: 1 day

## 2013-11-19 NOTE — H&P (Addendum)
[facility] History and Physical  [name] QIH:[id number] DOB: 10/26/1950 DOA: 11/18/2013  Referring physician: ED physician PCP: Darlin Coco, MD   Chief Complaint:   HPI:  63 y.o. 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 She explains that due to her nausea she is unable to eat or drink much and feels dehydrated . She denies chest pain or shortness of breath but reports chronic productive cough of clear sputum. Please note that pt doses off throughout the interview and is unable to provide clear history. No family at bedside.   In ED, pt noted to be hemodynamically stable, WBC 19.7, CT chest with ? Underlying lung cancer vs pneumonitis. [facility] asked to admit for further e valuation.   Assessment and Plan: Active Problems: Acute hypoxic respiratory failure - ? PNA/pneumonitis as suggested per CT chest vs malignancy - will place on broad spectrum ABX - provide oxygen via [place], bronchodilators scheduled and as needed  - will also place on Solumedrol due to mild wheezing  - sputum analysis requested, urine legionella and strep pneumo   SIRS - secondary to the above - ABX Aztreonam and Levaquin - provide IVF, check lactic acid, blood and urine culture - additional bolus NS 1 L now  - repeat CBC in AM UTI  - no clear urinary symptoms reported per pt, suggested based on UA - current ABX should be adequate in coverage  - follow up on urine culture  Lung nodules  - pt with long history of tobacco abuse  - needs close monitoring  Acute renal failure - appears to be pre renal in etiology secondary to poor oral intake - provide IVF and repeat BMP in AM COPD - provide bronchodilators scheduled and as needed  - solumedrol ordered - ABX as noted above   Hyperkalemia - repeat BMP now  Leukocytosis - secondary to ? PNA/ UTI, underlying CML - ABX Levaquin and Aztreonam as noted above and repeat CBC in AM Anemia of chronic disease - no signs of active bleeding  - repeat CBC in AM Severe malnutrition - advance diet as pt able to tolerate  CML   Radiological Exams on Admission: Dg Chest 2 View  11/18/2013   CLINICAL DATA:  Hypoxia  EXAM: CHEST  2 VIEW  COMPARISON:  October 15, 2013  FINDINGS: There is a 1.5 x 1.5 cm nodular opacity in the right lower lobe. There are scattered areas of mild scarring bilaterally. There is no edema or consolidation. Heart is upper normal in size with pulmonary vascularity within normal limits. No adenopathy. No bone lesions.  IMPRESSION: 1.5 x 1.5 cm nodular opacity right lower lobe. This finding warrants noncontrast enhanced chest CT to further evaluate.  No edema or consolidation.   Electronically Signed   By: Lowella Grip M.D.   On: 11/18/2013 21:10   Ct Chest Wo Contrast  11/18/2013   CLINICAL DATA:  Abnormal chest x-ray.  Rule out mass lesion  EXAM: CT CHEST WITHOUT CONTRAST  TECHNIQUE: Multidetector CT imaging of the chest was performed following the standard protocol without IV contrast.  COMPARISON:  Chest x-ray 11/18/2013  FINDINGS: Multiple ill-defined nodular densities in the right upper lobe and right lower lobe. Confluent infiltrate in the right posterior lung base is present consistent  with pneumonia. Other nodules may also be infectious however tumor not excluded.  Mild patchy nodular infiltrates in the left upper lobe and left lower lobe.  Negative for pleural effusion. Negative for adenopathy. Esophagus is dilated and fluid-filled. No acute bony change.  IMPRESSION: Numerous upper and lower lobe nodular densities bilaterally, right greater than left. Favor diffuse pneumonitis however metastatic disease cannot be excluded. Correlate with fever and white count. Followup CT after antibiotic treatment would  be helpful to assure clearing.   Electronically Signed   By: Franchot Gallo M.D.   On: 11/18/2013 23:24     Code Status: Full Family Communication: Pt at bedside Disposition Plan: Admit for further evaluation     Review of Systems:  Constitutional: Negative for diaphoresis.  HENT: Negative for hearing loss, ear pain, nosebleeds, sore throat, neck pain, tinnitus and ear discharge.   Eyes: Negative for blurred vision, double vision, photophobia, pain, discharge and redness.  Respiratory: Negative for wheezing and stridor.   Cardiovascular: Negative for chest pain, palpitations, orthopnea, claudication and leg swelling.  Gastrointestinal: Negative for heartburn, constipation, blood in stool and melena.  Genitourinary: Negative for dysuria, urgency, frequency, hematuria and flank pain.  Musculoskeletal: Negative for myalgias, back pain, joint pain and falls.  Skin: Negative for itching and rash.  Neurological: Negative for tingling, tremors, sensory change, loss of consciousness and headaches.  Endo/Heme/Allergies: Negative for environmental allergies and polydipsia. Does not bruise/bleed easily.  Psychiatric/Behavioral: Negative for suicidal ideas. The patient is not nervous/anxious.      Past Medical History  Diagnosis Date  . Hypertension     She has a past hx of essential  . 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 Inflammation of joint of knee     Since we last last saw her she developed problems with an acute which required surgical drainage by her orthopedist Dr. Durward Fortes.  Marland Kitchen MRSA (methicillin resistant Staphylococcus aureus)     Knee surgery drainage was positive for MRSA and she was treated with 3 weeks of doxycycline successfully.  . Diarrhea     Mild  . Exogenous obesity   . GERD (gastroesophageal reflux disease)     2 hosp.- ischemic colitis - residual of Norovirus, 05/2011- sm. bowel obstruction   . Headache(784.0)     migraine headache on occas, less now than when she was younger   . Arthritis     L hip, back, neck   . History of blood transfusion sept 2013    04/2011- /w ischemic colitis , trouble with matching blood  sept 2013  . Anemia     will see hematology consult prior to surgery, recommended by Dr. Mare Ferrari  . Anemia 12/15/2010  . Ischemic colitis 01/31/2012  . Atrial flutter     during hospitalization, 04/2011- related to anemia & illness/stress   . Pneumonia     04/2011- not hospitalized , pt. denies SOB, changes in chest, breathing  . 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 Dizziness     occasional  . B12 deficiency 02/27/2013  . 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 COPD (chronic obstructive pulmonary disease) 09/26/2013    Past Surgical History  Procedure Laterality Date  . Knee surgery      I&D- 2008, post laceration   . Colonoscopy  05/16/2011    Procedure: COLONOSCOPY;  Surgeon: Winfield Cunas., MD;  Location: [facility];  Service: Endoscopy;  Laterality: N/A;  . Small intestine surgery  1992, 1999  . Laparotomy and lysis of adhesions    . Total hip arthroplasty  10/25/2011    Procedure: TOTAL HIP ARTHROPLASTY;  Surgeon: Garald Balding, MD;  Location: [facility];  Service: Orthopedics;  Laterality: Left;  . Appendectomy  1962  . Abdominal hysterectomy  1988  . Partial colectomy and colostomy  sept 2013    mucous fistula done  . Partial colectomy  05/17/2012    Procedure: PARTIAL COLECTOMY;  Surgeon: Odis Hollingshead, MD;  Location: [facility];  Service: General;  Laterality: N/A;  . Colostomy closure  05/17/2012    Procedure: COLOSTOMY CLOSURE;  Surgeon: Odis Hollingshead, MD;  Location: [facility];  Service: General;  Laterality: N/A;  . Laparotomy  05/17/2012    Procedure: EXPLORATORY LAPAROTOMY;  Surgeon: Odis Hollingshead, MD;  Location: [facility];  Service: General;;  . Lysis of adhesion  05/17/2012    Procedure: LYSIS OF ADHESION;  Surgeon: Odis Hollingshead,  MD;  Location: [facility];  Service: General;;  . Esophageal biopsy  05/17/2012    Procedure: BIOPSY;  Surgeon: Odis Hollingshead, MD;  Location: [facility];  Service: General;;  omental biopsy  . Laparotomy  05/22/2012    Procedure: EXPLORATORY LAPAROTOMY;  Surgeon: Odis Hollingshead, MD;  Location: [facility];  Service: General;  Laterality: N/A;  DRAINAGE  INTRA-ABDOMINAL ABSCESS/LYSIS OF ADHESIONSFOR SMALL BOWEL OBSTRUCTION/DIVERTING LOOP ILEOSTOMY    Social History:  reports that she quit smoking about 7 months ago. Her smoking use included Cigarettes. She has a 5 pack-year smoking history. She has never used smokeless tobacco. She reports that she does not drink alcohol or use illicit drugs.  Allergies  Allergen Reactions  . Vancomycin Hives and Rash    wheezing  . Ativan [Lorazepam] Other (See Comments)    "makes me crazy"  . Codeine Nausea And Vomiting  . Tetanus Toxoids Other (See Comments)    Serum reaction  . Penicillins Hives and Rash  . Xarelto [Rivaroxaban] Hives and Rash    (May not be allergic. Vancomycin was also being taken when reaction occurred.)    Family History  Problem Relation Age of Onset  . Stroke Father   . Atrial fibrillation Father   . Hypertension Mother     Prior to Admission medications   Medication Sig Start Date End Date Taking? Authorizing Provider  albuterol (PROVENTIL HFA;VENTOLIN HFA) 108 (90 BASE) MCG/ACT inhaler Inhale 1-2 puffs into the lungs every 6 (six) hours as needed for wheezing or shortness of breath.    Yes Historical Provider, MD  aspirin 325 MG EC tablet Take 325 mg by mouth every morning.    Yes Historical Provider, MD  bisoprolol (ZEBETA) 5 MG tablet Take 5 mg by mouth every morning.   Yes Historical Provider, MD  buPROPion (WELLBUTRIN XL) 300 MG 24 hr tablet Take 300 mg by mouth daily before breakfast.    Yes Historical Provider, MD  calcium carbonate (TUMS E-X 750) 750 MG chewable tablet Chew 1 tablet (750 mg total) by mouth 3 (three) times  daily. 10/18/13  Yes Nishant Dhungel, MD  celecoxib (CELEBREX) 200 MG capsule Take 200 mg by mouth every morning.    Yes Historical Provider, MD  cyclobenzaprine (FLEXERIL) 10 MG tablet Take 10 mg by mouth 3 (three) times daily as needed for muscle spasms.  07/12/13  Yes Historical Provider, MD  diphenoxylate-atropine (LOMOTIL) 2.5-0.025 MG per tablet Take 1 tablet by mouth 4 (  four) times daily. 11/01/13  Yes Odis Hollingshead, MD  esomeprazole (NEXIUM) 40 MG capsule Take 40 mg by mouth 2 (two) times daily before a meal.    Yes Historical Provider, MD  estradiol (ESTRACE) 2 MG tablet Take 2 mg by mouth daily before breakfast.    Yes Historical Provider, MD  HYDROcodone-acetaminophen (NORCO) 10-325 MG per tablet Take 1 tablet by mouth every 4 (four) hours as needed for moderate pain.  07/13/13  Yes Historical Provider, MD  loperamide (IMODIUM) 2 MG capsule Take 2 mg by mouth 4 (four) times daily.    Yes Historical Provider, MD  loratadine-pseudoephedrine (CLARITIN-D 12-HOUR) 5-120 MG per tablet Take 1 tablet by mouth daily as needed for allergies.    Yes Historical Provider, MD  nicotine (NICODERM CQ - DOSED IN MG/24 HOURS) 14 mg/24hr patch Place 1 patch (14 mg total) onto the skin daily. 10/18/13  Yes Nishant Dhungel, MD  ondansetron (ZOFRAN) 4 MG tablet Take 4 mg by mouth every 8 (eight) hours as needed for nausea or vomiting.   Yes Historical Provider, MD  promethazine (PHENERGAN) 25 MG tablet Take 25 mg by mouth every 6 (six) hours as needed for nausea or vomiting (nausea).   Yes Historical Provider, MD    Physical Exam: Filed Vitals:   11/18/13 1906 11/18/13 2121  BP: 127/84 98/74  Pulse:  77  Temp: 97.1 F (36.2 C)   TempSrc: Axillary   Resp: 20 18    Physical Exam  Constitutional: Appears somnolent but easy to arouse, answers most of the questions appropriately, frail and cachectic  HENT: Normocephalic. External right and left ear normal. Dry MM Eyes: Conjunctivae and EOM are normal.  PERRLA, no scleral icterus.  Neck: Normal ROM. Neck supple. No JVD. No tracheal deviation. No thyromegaly.  CVS: RRR, S1/S2 +, no murmurs, no gallops, no carotid bruit.  Pulmonary: Diffuse rhonchi bilaterally with expiratory wheezing, diminished air movement at bases  Abdominal: Soft. BS +,  no distension, tenderness, rebound or guarding.  Musculoskeletal: Normal range of motion. No edema and no tenderness.  Lymphadenopathy: No lymphadenopathy noted, cervical, inguinal. Neuro: Somnolent but follows commands when awake. Strength in upper and lower extremities equal bilaterally  Skin: Skin is warm and dry. No rash noted. Not diaphoretic. No erythema. No pallor.  Psychiatric: Normal mood and affect.   Labs on Admission:  Basic Metabolic Panel:  Recent Labs Lab 11/18/13 1945  NA 131*  K 5.6*  CL 101  CO2 9*  GLUCOSE 103*  BUN 51*  CREATININE 2.23*  CALCIUM 6.8*   Liver Function Tests:  Recent Labs Lab 11/18/13 1945  AST 13  ALT 9  ALKPHOS 708*  BILITOT 0.4  PROT 8.0  ALBUMIN 3.3*   CBC:  Recent Labs Lab 11/18/13 1945  WBC 19.7*  NEUTROABS 13.0*  HGB 10.1*  HCT 29.7*  MCV 98.0  PLT 315   EKG: Normal sinus rhythm, no ST/T wave changes  Faye Ramsay, MD  [facility] Pager [phone]  If 7PM-7AM, please contact night-coverage [email or web address] Password [email or web address] 11/19/2013, 1:00 AM

## 2013-11-19 NOTE — ED Provider Notes (Signed)
Medical screening examination/treatment/procedure(s) were conducted as a shared visit with non-physician practitioner(s) and myself.  I personally evaluated the patient during the encounter.   EKG Interpretation None     Pt comes in with cc of nausea, emesis. Hx of abd surgery, and she has an open wound being attended to daily in her abdomen (healing by secondary intention). Pt has AKI, UTI, and dehydrated. Will hydrate. Abd exam is benign - no Ct indicated. Admit.  Varney Biles, MD 11/19/13 1610

## 2013-11-19 NOTE — Consult Note (Signed)
PULMONARY / CRITICAL CARE MEDICINE   Name: [name] MRN: [id number] DOB: [birth year]    ADMISSION DATE:  11/18/2013 CONSULTATION DATE: 8/4  REFERRING MD : [facility]  CHIEF COMPLAINT:  Poor PO intake  INITIAL PRESENTATION: Dehydration/shock  STUDIES:  8/4 procal>> 8/4 trop i>> CT chest 8/3 >> nodular infiltrates bilaterally, R>L; ? Infectious vs other  SIGNIFICANT EVENTS: 8/4 tx to icu shock, metabolic disarray   HISTORY OF PRESENT ILLNESS:   She was in her usual state of poor health till 4 days ago(7/31) when she developed nausea(no vomiting) decreased PO intake. These symptoms were exacerbated by recent Neupogen inject for chronic anemia. She presented to [facility] 8/3 , hypotensive, poorly compensated metabolic acidosis (ph 7.8,GNF6 18, bicarb 6.8) along with renal failure base creatine .85, 2.23 on admit. She has no IV assess and her CT of chest revealed multiple pulmonary nodules. PCCM asked to consult, place cvl and assist in managing her care.   PAST MEDICAL HISTORY :  Past Medical History  Diagnosis Date  . Hypertension     She has a past hx of essential  . 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 Inflammation of joint of knee     Since we last last saw her she developed problems with an acute which required surgical drainage by her orthopedist Dr. Durward Fortes.  Marland Kitchen MRSA (methicillin resistant Staphylococcus aureus)     Knee surgery drainage was positive for MRSA and she was treated with 3 weeks of doxycycline successfully.  . Diarrhea     Mild  . Exogenous obesity   . GERD (gastroesophageal reflux disease)     2 hosp.- ischemic colitis - residual of Norovirus, 05/2011- sm. bowel obstruction  . Headache(784.0)     migraine headache on occas, less now than when she was younger   . Arthritis     L hip, back, neck   . History of blood transfusion sept 2013    04/2011- /w ischemic colitis , trouble with  matching blood  sept 2013  . Anemia     will see hematology consult prior to surgery, recommended by Dr. Mare Ferrari  . Anemia 12/15/2010  . Ischemic colitis 01/31/2012  . Atrial flutter     during hospitalization, 04/2011- related to anemia & illness/stress   . Pneumonia     04/2011- not hospitalized , pt. denies SOB, changes in chest, breathing  . 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 Dizziness     occasional  . B12 deficiency 02/27/2013  . 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 COPD (chronic obstructive pulmonary disease) 09/26/2013   Past Surgical History  Procedure Laterality Date  . Knee surgery      I&D- 2008, post laceration   . Colonoscopy  05/16/2011    Procedure: COLONOSCOPY;  Surgeon: Winfield Cunas., MD;  Location: [facility];  Service: Endoscopy;  Laterality: N/A;  . Small intestine surgery  1992, 1999  . Laparotomy and lysis of adhesions    . Total hip arthroplasty  10/25/2011    Procedure: TOTAL HIP ARTHROPLASTY;  Surgeon: Garald Balding, MD;  Location: [employer] [facility];  Service: Orthopedics;  Laterality: Left;  . Appendectomy  1962  . Abdominal hysterectomy  1988  . Partial colectomy and colostomy  sept 2013    mucous fistula done  . Partial colectomy  05/17/2012    Procedure: PARTIAL COLECTOMY;  Surgeon: Odis Hollingshead, MD;  Location: [facility];  Service: General;  Laterality: N/A;  . Colostomy closure  05/17/2012    Procedure: COLOSTOMY CLOSURE;  Surgeon: Odis Hollingshead, MD;  Location: [facility];  Service: General;  Laterality: N/A;  . Laparotomy  05/17/2012    Procedure: EXPLORATORY LAPAROTOMY;  Surgeon: Odis Hollingshead, MD;  Location: [facility];  Service: General;;  . Lysis of adhesion  05/17/2012    Procedure: LYSIS OF ADHESION;  Surgeon: Odis Hollingshead, MD;  Location: [facility];  Service: General;;  . Esophageal biopsy  05/17/2012    Procedure: BIOPSY;  Surgeon: Odis Hollingshead, MD;  Location: [facility];  Service: General;;  omental biopsy  . Laparotomy  05/22/2012     Procedure: EXPLORATORY LAPAROTOMY;  Surgeon: Odis Hollingshead, MD;  Location: [facility];  Service: General;  Laterality: N/A;  DRAINAGE  INTRA-ABDOMINAL ABSCESS/LYSIS OF ADHESIONSFOR SMALL BOWEL OBSTRUCTION/DIVERTING LOOP ILEOSTOMY   Prior to Admission medications   Medication Sig Start Date End Date Taking? Authorizing Provider  albuterol (PROVENTIL HFA;VENTOLIN HFA) 108 (90 BASE) MCG/ACT inhaler Inhale 1-2 puffs into the lungs every 6 (six) hours as needed for wheezing or shortness of breath.    Yes Historical Provider, MD  aspirin 325 MG EC tablet Take 325 mg by mouth every morning.    Yes Historical Provider, MD  bisoprolol (ZEBETA) 5 MG tablet Take 5 mg by mouth every morning.   Yes Historical Provider, MD  buPROPion (WELLBUTRIN XL) 300 MG 24 hr tablet Take 300 mg by mouth daily before breakfast.    Yes Historical Provider, MD  calcium carbonate (TUMS E-X 750) 750 MG chewable tablet Chew 1 tablet (750 mg total) by mouth 3 (three) times daily. 10/18/13  Yes Nishant Dhungel, MD  celecoxib (CELEBREX) 200 MG capsule Take 200 mg by mouth every morning.    Yes Historical Provider, MD  cyclobenzaprine (FLEXERIL) 10 MG tablet Take 10 mg by mouth 3 (three) times daily as needed for muscle spasms.  07/12/13  Yes Historical Provider, MD  diphenoxylate-atropine (LOMOTIL) 2.5-0.025 MG per tablet Take 1 tablet by mouth 4 (four) times daily. 11/01/13  Yes Odis Hollingshead, MD  esomeprazole (NEXIUM) 40 MG capsule Take 40 mg by mouth 2 (two) times daily before a meal.    Yes Historical Provider, MD  estradiol (ESTRACE) 2 MG tablet Take 2 mg by mouth daily before breakfast.    Yes Historical Provider, MD  HYDROcodone-acetaminophen (NORCO) 10-325 MG per tablet Take 1 tablet by mouth every 4 (four) hours as needed for moderate pain.  07/13/13  Yes Historical Provider, MD  loperamide (IMODIUM) 2 MG capsule Take 2 mg by mouth 4 (four) times daily.    Yes Historical Provider, MD  loratadine-pseudoephedrine (CLARITIN-D  12-HOUR) 5-120 MG per tablet Take 1 tablet by mouth daily as needed for allergies.    Yes Historical Provider, MD  nicotine (NICODERM CQ - DOSED IN MG/24 HOURS) 14 mg/24hr patch Place 1 patch (14 mg total) onto the skin daily. 10/18/13  Yes Nishant Dhungel, MD  ondansetron (ZOFRAN) 4 MG tablet Take 4 mg by mouth every 8 (eight) hours as needed for nausea or vomiting.   Yes Historical Provider, MD  promethazine (PHENERGAN) 25 MG tablet Take 25 mg by mouth every 6 (six) hours as needed for nausea or vomiting (nausea).   Yes Historical Provider, MD   Allergies  Allergen Reactions  . Vancomycin Hives and Rash    wheezing  . Ativan [Lorazepam] Other (See Comments)    "makes  me crazy"  . Codeine Nausea And Vomiting  . Tetanus Toxoids Other (See Comments)    Serum reaction  . Penicillins Hives and Rash  . Xarelto [Rivaroxaban] Hives and Rash    (May not be allergic. Vancomycin was also being taken when reaction occurred.)    FAMILY HISTORY:  Family History  Problem Relation Age of Onset  . Stroke Father   . Atrial fibrillation Father   . Hypertension Mother    SOCIAL HISTORY:  reports that she quit smoking about 7 months ago. Her smoking use included Cigarettes. She has a 5 pack-year smoking history. She has never used smokeless tobacco. She reports that she does not drink alcohol or use illicit drugs.  REVIEW OF SYSTEMS:   10 point review of system taken, please see HPI for positives and negatives.  SUBJECTIVE:    VITAL SIGNS: Temp:  [97.1 F (36.2 C)-97.8 F (36.6 C)] 97.6 F (36.4 C) (08/04 0752) Pulse Rate:  [29-77] 42 (08/04 0618) Resp:  [15-20] 15 (08/04 0618) BP: (98-128)/(70-90) 128/90 mmHg (08/04 0500) SpO2:  [81 %-100 %] 100 % (08/04 0618) Weight:  [118 lb 2.7 oz (53.6 kg)-132 lb 4.4 oz (60 kg)] 118 lb 2.7 oz (53.6 kg) (08/04 0500) HEMODYNAMICS:   VENTILATOR SETTINGS:   INTAKE / OUTPUT:  Intake/Output Summary (Last 24 hours) at 11/19/13 6759 Last data filed at  11/19/13 0500  Gross per 24 hour  Intake  217.5 ml  Output      0 ml  Net  217.5 ml    PHYSICAL EXAMINATION: General:  Frail, ill appearing , wasted female Neuro:  Alert and follows commands HEENT: No JVD/LAN ,  HSr RRR Lungs:  diminished in bases, + rhonchi Abdomen:  Dressing intact, ileostomy with drainage  Musculoskeletal:  Wasted muscles Skin:  warm  LABS:  CBC  Recent Labs Lab 11/18/13 1945 11/19/13 0615  WBC 19.7* 14.9*  HGB 10.1* 8.4*  HCT 29.7* 25.1*  PLT 315 222   Coag's No results found for this basename: APTT, INR,  in the last 168 hours BMET  Recent Labs Lab 11/18/13 1945 11/19/13 0615  NA 131* 132*  K 5.6* 5.0  CL 101 105  CO2 9* 9*  BUN 51* 46*  CREATININE 2.23* 1.80*  GLUCOSE 103* 89   Electrolytes  Recent Labs Lab 11/18/13 1945 11/19/13 0615  CALCIUM 6.8* 5.8*   Sepsis Markers  Recent Labs Lab 11/19/13 0615  LATICACIDVEN 0.8   ABG  Recent Labs Lab 11/19/13 0335  PHART 7.205*  PCO2ART 17.7*  PO2ART 124.0*   Liver Enzymes  Recent Labs Lab 11/18/13 1945  AST 13  ALT 9  ALKPHOS 708*  BILITOT 0.4  ALBUMIN 3.3*   Cardiac Enzymes No results found for this basename: TROPONINI, PROBNP,  in the last 168 hours Glucose No results found for this basename: GLUCAP,  in the last 168 hours  Imaging Dg Chest 2 View  11/18/2013   CLINICAL DATA:  Hypoxia  EXAM: CHEST  2 VIEW  COMPARISON:  October 15, 2013  FINDINGS: There is a 1.5 x 1.5 cm nodular opacity in the right lower lobe. There are scattered areas of mild scarring bilaterally. There is no edema or consolidation. Heart is upper normal in size with pulmonary vascularity within normal limits. No adenopathy. No bone lesions.  IMPRESSION: 1.5 x 1.5 cm nodular opacity right lower lobe. This finding warrants noncontrast enhanced chest CT to further evaluate.  No edema or consolidation.   Electronically Signed  By: Lowella Grip M.D.   On: 11/18/2013 21:10   Ct Chest Wo  Contrast  11/18/2013   CLINICAL DATA:  Abnormal chest x-ray.  Rule out mass lesion  EXAM: CT CHEST WITHOUT CONTRAST  TECHNIQUE: Multidetector CT imaging of the chest was performed following the standard protocol without IV contrast.  COMPARISON:  Chest x-ray 11/18/2013  FINDINGS: Multiple ill-defined nodular densities in the right upper lobe and right lower lobe. Confluent infiltrate in the right posterior lung base is present consistent with pneumonia. Other nodules may also be infectious however tumor not excluded.  Mild patchy nodular infiltrates in the left upper lobe and left lower lobe.  Negative for pleural effusion. Negative for adenopathy. Esophagus is dilated and fluid-filled. No acute bony change.  IMPRESSION: Numerous upper and lower lobe nodular densities bilaterally, right greater than left. Favor diffuse pneumonitis however metastatic disease cannot be excluded. Correlate with fever and white count. Followup CT after antibiotic treatment would be helpful to assure clearing.   Electronically Signed   By: Franchot Gallo M.D.   On: 11/18/2013 23:24     ASSESSMENT / PLAN:  PULMONARY OETT>>none  A: Hypoxia Pulmonary nodules, suspect HCAP but consider other causes COPD with continued tobacco use P:   O2, BD's Would treat as suspected HCAP, plan to follow repeat chest imaging, probably repeat CT in 4-6 weeks to look for clearance. If nodularity persists then favor FOB or biopsies. If she decompensates then will perform FOB sooner for cx data Smoking cessation  CARDIOVASCULAR L IJ CVC 8/4>> A: Shock Presumed dehydration but consider sepsis - Not on home steroids P:  Place cvl 8/4 Hydration Dc antihypertensives Check cortisol level Check trop I Monitor cvp x 48 hours and volume resuscitate Empiric abx as below  RENAL A:  Acute renal failure, likely ATN from hypovolemia Mixed AG and non-AG metabolic acidosis (delta ratio 0.4 on 8/4). Suspect due to high-output ileostomy and GI  losses + acute renal failure P:   Hydration Avoid nephrotoxins Bicarb drip started 8/4  GASTROINTESTINAL A:   Short gut syndrome Hx Ischemic bowel with ileostomy Open abd wound  P:   CCS consult Consider short term TNA depending on ability to maintain PO calories and fluids  HEMATOLOGIC A:   CMML Anemia  P:  Consider Hem/onc consult  INFECTIOUS A:   Possible UTI Probable HCAP, R>L P:   BCx2 8/3>> UC 8/3>> Abx: azactam, start date8/3, day 1/x Abx: levaquin, start date8/3, day 1/x  Consider Zyvox (vanc allergy) in immunocompromised pt if she decompensates Check procalcitonin 8/4>>  ENDOCRINE A:  No acute issue P:     NEUROLOGIC A:  No focal defects Mild confusion from illness state P:   RASS goal: 0   TODAY'S SUMMARY:  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 Most notable is ischemic bowel requiring colostomy with subsequent ileostomy with slowly healing midline wound (Dr. Zella Richer). Her care is further complicated by CMML (Dr. Alvy Bimler). She was in her usual state of poor health till 4 days ago (7/31) when she developed nausea (no vomiting) decreased PO intake. These symptoms may have been exacerbated by recent Neupogen inject for chronic anemia. She presented to [facility] 8/3 , hypotensive, poorly compensated metabolic acidosis (ph 4.0,NUU7 18, bicarb 6.8) along with renal failure, baseline creatine .85, 2.23 on admit. CT of chest revealed multiple nodular infiltrates, suspicious for HCAP.Marland Kitchen PCCM asked to consult, place cvl and assist in managing her care.  Richardson Landry Minor ACNP Maryanna Shape PCCM Pager [phone] till 3 pm If no answer page [phone] 11/19/2013, 9:58 AM  I have personally obtained a history, examined the patient, evaluated laboratory and imaging results, formulated the assessment and plan and placed orders. CRITICAL CARE: The patient is critically ill with multiple organ systems failure and requires high complexity  decision making for assessment and support, frequent evaluation and titration of therapies, application of advanced monitoring technologies and extensive interpretation of multiple databases. Critical Care Time devoted to patient care services described in this note is 60 minutes.   Baltazar Apo, MD, PhD 11/19/2013, 11:10 AM [facility] [phone] or if no answer [phone]

## 2013-11-19 NOTE — Progress Notes (Addendum)
CARE MANAGEMENT NOTE 11/22/2013  Patient:  [name], [name]   Account Number:  [id number]  Date Initiated:  11/19/2013  Documentation initiated by:  DAVIS,RHONDA  Subjective/Objective Assessment:   8/4 tx to icu shock, metabolic disarray     Action/Plan:   home when stable   Anticipated DC Date:  11/22/2013   Anticipated DC Plan:  HOME/SELF CARE  In-house referral  NA      DC Planning Services  NA      [place] [place] [facility] Choice  NA   Choice offered to / List presented to:  NA   DME arranged  NA      DME agency  NA     [place] arranged  NA      [place] agency  NA   Status of service:  In process, will continue to follow Medicare Important Message given?  NA - LOS <3 / Initial given by admissions (If response is "NO", the following Medicare IM given date fields will be blank) Date Medicare IM given:   Medicare IM given by:   Date Additional Medicare IM given:   Additional Medicare IM given by:    Discharge Disposition:    Per UR Regulation:  Reviewed for med. necessity/level of care/duration of stay  If discussed at [place] of Stay Meetings, dates discussed:    Comments:  [id number]/Rhonda Eldridge Dace, [place], [place] [phone] Chart Reviewed for discharge and hospital needs. Discharge needs at time of review: None present will follow for needs. Review of patient progress due on [id number].

## 2013-11-19 NOTE — Procedures (Signed)
Central Venous Catheter Insertion Procedure Note Aubria Vanecek [id number] 1950/12/20  Procedure: Insertion of Central Venous Catheter Indications: Assessment of intravascular volume, Drug and/or fluid administration and Frequent blood sampling  Procedure Details Consent: Risks of procedure as well as the alternatives and risks of each were explained to the (patient/caregiver).  Consent for procedure obtained. Time Out: Verified patient identification, verified procedure, site/side was marked, verified correct patient position, special equipment/implants available, medications/allergies/relevent history reviewed, required imaging and test results available.  Performed  Maximum sterile technique was used including antiseptics, cap, gloves, gown, hand hygiene, mask and sheet. Skin prep: Chlorhexidine; local anesthetic administered A antimicrobial bonded/coated triple lumen catheter was placed in the left internal jugular vein using the Seldinger technique. Ultrasound guidance used.Yes.   Catheter placed to 20 cm. Blood aspirated via all 3 ports and then flushed x 3. Line sutured x 2 and dressing applied.  Evaluation Blood flow good Complications: No apparent complications Patient did tolerate procedure well. Chest X-ray ordered to verify placement.  CXR: Derrill Center Minor ACNP Maryanna Shape PCCM Pager [phone] till 3 pm If no answer page [phone] 11/19/2013, 10:09 AM   Baltazar Apo, MD, PhD 11/20/2013, 10:22 AM [facility] [phone] or if no answer [phone]

## 2013-11-19 NOTE — Progress Notes (Signed)
Echocardiogram 2D Echocardiogram has been performed.  [name] 11/19/2013, 1:49 PM

## 2013-11-19 NOTE — Progress Notes (Signed)
CRITICAL VALUE ALERT  Critical value received: calcium 5.4  Date of notification: 11/19/13  Time of notification:  2200  Critical value read back:Yes.    Nurse who received alert:  Darrin Nipper   MD notified (1st page):  Elink  Time of first page:  2200  Responding MD:  Warren Lacy

## 2013-11-19 NOTE — Progress Notes (Addendum)
Pt remains hypoxic, ABG requested, close monitoring in SDU. Repeat BMP now, lactic acid. Continue ABX Levaquin and Aztreonam.   Faye Ramsay, MD  [facility] Pager [phone] Cell [phone]  If 7PM-7AM, please contact night-coverage [email or web address] Password [email or web address]

## 2013-11-19 NOTE — Progress Notes (Signed)
CRITICAL VALUE ALERT  Critical value received:  Mag 0.7  Date of notification:  11/19/13  Time of notification:  1110  Critical value read back:Yes.    Nurse who received alert:  M. Gustaf Mccarter  MD notified (1st page):  Dr. Lamonte Sakai  Time of first page:  56  MD notified (2nd page):  Time of second page:  Responding MD:  Dr. Lamonte Sakai  Time MD responded:  [phone]

## 2013-11-19 NOTE — Progress Notes (Signed)
ANTIBIOTIC CONSULT NOTE - INITIAL  Pharmacy Consult for Levaquin/Azactam Indication: Urosepsis/r/o PNA  Allergies  Allergen Reactions  . Vancomycin Hives and Rash    wheezing  . Ativan [Lorazepam] Other (See Comments)    "makes me crazy"  . Codeine Nausea And Vomiting  . Tetanus Toxoids Other (See Comments)    Serum reaction  . Penicillins Hives and Rash  . Xarelto [Rivaroxaban] Hives and Rash    (May not be allergic. Vancomycin was also being taken when reaction occurred.)    Patient Measurements: Height: 5' 4.96" (165 cm) Weight: 132 lb 4.4 oz (60 kg) IBW/kg (Calculated) : 56.91   Vital Signs: Temp: 97.6 F (36.4 C) (08/04 0150) Temp src: Oral (08/04 0150) BP: 112/79 mmHg (08/04 0150) Pulse Rate: 71 (08/04 0150) Intake/Output from previous day:   Intake/Output from this shift:    Labs:  Recent Labs  11/18/13 1945  WBC 19.7*  HGB 10.1*  PLT 315  CREATININE 2.23*   Estimated Creatinine Clearance: 22.9 ml/min (by C-G formula based on Cr of 2.23). No results found for this basename: Letta Median, VANCORANDOM, [place], GENTPEAK, [place], [place], TOBRAPEAK, TOBRARND, AMIKACINPEAK, AMIKACINTROU, AMIKACIN,  in the last 72 hours   Microbiology: Recent Results (from the past 720 hour(s))  TECHNOLOGIST REVIEW     Status: None   Collection Time    10/22/13 10:50 AM      Result Value Ref Range Status   Technologist Review 3% Metas and 4% Myelocytes present, 1% NRBCs   Final  TECHNOLOGIST REVIEW     Status: None   Collection Time    11/05/13 11:39 AM      Result Value Ref Range Status   Technologist Review Rare NRBC and Rare Myelo   Final    Medical History: Past Medical History  Diagnosis Date  . Hypertension     She has a past hx of essential  . 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 Inflammation of joint of knee     Since we last last saw her she developed  problems with an acute which required surgical drainage by her orthopedist Dr. Durward Fortes.  Marland Kitchen MRSA (methicillin resistant Staphylococcus aureus)     Knee surgery drainage was positive for MRSA and she was treated with 3 weeks of doxycycline successfully.  . Diarrhea     Mild  . Exogenous obesity   . GERD (gastroesophageal reflux disease)     2 hosp.- ischemic colitis - residual of Norovirus, 05/2011- sm. bowel obstruction  . Headache(784.0)     migraine headache on occas, less now than when she was younger   . Arthritis     L hip, back, neck   . History of blood transfusion sept 2013    04/2011- /w ischemic colitis , trouble with matching blood  sept 2013  . Anemia     will see hematology consult prior to surgery, recommended by Dr. Mare Ferrari  . Anemia 12/15/2010  . Ischemic colitis 01/31/2012  . Atrial flutter     during hospitalization, 04/2011- related to anemia & illness/stress   . Pneumonia     04/2011- not hospitalized , pt. denies SOB, changes in chest, breathing  . 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 Dizziness     occasional  . B12 deficiency 02/27/2013  . 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 COPD (chronic obstructive pulmonary disease) 09/26/2013    Medications:  Scheduled:  . aspirin  325 mg Oral q morning - 10a  . aztreonam  1 g Intravenous Q8H  . aztreonam  2 g Intravenous Once  . buPROPion  300 mg Oral QAC breakfast  . enoxaparin (LOVENOX) injection  40 mg Subcutaneous Q24H  . estradiol  2 mg Oral QAC breakfast  . [START ON 11/20/2013] levofloxacin (LEVAQUIN) IV  750 mg Intravenous Q48H  . nicotine  14 mg Transdermal Daily  . pantoprazole  40 mg Oral Q1200   Infusions:  . sodium chloride 75 mL/hr at 11/19/13 0206   Assessment: 14 yoF with a PMH of CML on oral chemotherapy, COPD, anemia of chronic disease, HTN, and s/p ileostomy presents to the ED with main concern of several days duration of poor oral intake, nausea, and says it started after she was given epogen  by her oncologist for anemia. Azactam and Levaquin for urosepsis, possible PNA per Rx.    Goal of Therapy:  Treat infection  Plan:   Azactam 2Gm x1 then 1 Gm IV q8h  Levaquin 750mg  IV q48h  F/U SCr/cultures/levels as needed  Lawana Pai R 11/19/2013,4:28 AM

## 2013-11-19 NOTE — Progress Notes (Signed)
CRITICAL VALUE ALERT  Critical value received:  Ca+ 5.8, CO2 9  Date of notification:  11/19/13  Time of notification:  0715  Critical value read back:Yes.    Nurse who received alert:  M. Sabre Leonetti  MD notified (1st page):  Dr. Grandville Silos  Time of first page:  0730  MD notified (2nd page):  Time of second page:  Responding MD:  Dr. Grandville Silos  Time MD responded:  [phone]

## 2013-11-19 NOTE — Progress Notes (Signed)
eLink Physician-Brief Progress Note Patient Name: [name] DOB: November 13, 1950 MRN: [id number]  Date of Service  11/19/2013   HPI/Events of Note   hypocal/hypoKkalemia  eICU Interventions  -repleted   Intervention Category Intermediate Interventions: Electrolyte abnormality - evaluation and management  Sarath Privott V. 11/19/2013, 10:09 PM

## 2013-11-20 ENCOUNTER — Inpatient Hospital Stay (HOSPITAL_COMMUNITY): Payer: BC Managed Care – PPO

## 2013-11-20 DIAGNOSIS — J441 Chronic obstructive pulmonary disease with (acute) exacerbation: Secondary | ICD-10-CM

## 2013-11-20 DIAGNOSIS — E43 Unspecified severe protein-calorie malnutrition: Secondary | ICD-10-CM | POA: Insufficient documentation

## 2013-11-20 DIAGNOSIS — J9600 Acute respiratory failure, unspecified whether with hypoxia or hypercapnia: Secondary | ICD-10-CM

## 2013-11-20 LAB — COMPREHENSIVE METABOLIC PANEL
ALT: 9 U/L (ref 0–35)
AST: 16 U/L (ref 0–37)
Albumin: 2.4 g/dL — ABNORMAL LOW (ref 3.5–5.2)
Alkaline Phosphatase: 571 U/L — ABNORMAL HIGH (ref 39–117)
Anion gap: 16 — ABNORMAL HIGH (ref 5–15)
BUN: 27 mg/dL — ABNORMAL HIGH (ref 6–23)
CALCIUM: 5.9 mg/dL — AB (ref 8.4–10.5)
CO2: 18 meq/L — AB (ref 19–32)
Chloride: 100 mEq/L (ref 96–112)
Creatinine, Ser: 1.13 mg/dL — ABNORMAL HIGH (ref 0.50–1.10)
GFR calc Af Amer: 58 mL/min — ABNORMAL LOW (ref >90)
GFR, EST NON AFRICAN AMERICAN: 50 mL/min — AB (ref >90)
Glucose, Bld: 165 mg/dL — ABNORMAL HIGH (ref 70–99)
POTASSIUM: 3.7 meq/L (ref 3.7–5.3)
SODIUM: 134 meq/L — AB (ref 137–147)
Total Bilirubin: 0.4 mg/dL (ref 0.3–1.2)
Total Protein: 5.8 g/dL — ABNORMAL LOW (ref 6.0–8.3)

## 2013-11-20 LAB — PTH, INTACT AND CALCIUM
Calcium, Total (PTH): 5.3 mg/dL — CL (ref 8.4–10.5)
PTH: 198.5 pg/mL — ABNORMAL HIGH (ref 14.0–72.0)

## 2013-11-20 LAB — CBC WITH DIFFERENTIAL/PLATELET
BASOS ABS: 0 10*3/uL (ref 0.0–0.1)
BASOS PCT: 0 % (ref 0–1)
EOS PCT: 0 % (ref 0–5)
Eosinophils Absolute: 0 10*3/uL (ref 0.0–0.7)
HCT: 19.6 % — ABNORMAL LOW (ref 36.0–46.0)
Hemoglobin: 6.7 g/dL — CL (ref 12.0–15.0)
Lymphocytes Relative: 18 % (ref 12–46)
Lymphs Abs: 1.7 10*3/uL (ref 0.7–4.0)
MCH: 31.3 pg (ref 26.0–34.0)
MCHC: 34.2 g/dL (ref 30.0–36.0)
MCV: 91.6 fL (ref 78.0–100.0)
Monocytes Absolute: 3.1 10*3/uL — ABNORMAL HIGH (ref 0.1–1.0)
Monocytes Relative: 32 % — ABNORMAL HIGH (ref 3–12)
Neutro Abs: 4.9 10*3/uL (ref 1.7–7.7)
Neutrophils Relative %: 50 % (ref 43–77)
Platelets: 188 10*3/uL (ref 150–400)
RBC: 2.14 MIL/uL — AB (ref 3.87–5.11)
RDW: 19.1 % — ABNORMAL HIGH (ref 11.5–15.5)
WBC: 9.7 10*3/uL (ref 4.0–10.5)

## 2013-11-20 LAB — PREPARE RBC (CROSSMATCH)

## 2013-11-20 LAB — PROCALCITONIN: PROCALCITONIN: 0.19 ng/mL

## 2013-11-20 LAB — IRON AND TIBC
Iron: 169 ug/dL — ABNORMAL HIGH (ref 42–135)
Saturation Ratios: 84 % — ABNORMAL HIGH (ref 20–55)
TIBC: 202 ug/dL — ABNORMAL LOW (ref 250–470)
UIBC: 33 ug/dL — ABNORMAL LOW (ref 125–400)

## 2013-11-20 LAB — MAGNESIUM: MAGNESIUM: 1.5 mg/dL (ref 1.5–2.5)

## 2013-11-20 LAB — TROPONIN I

## 2013-11-20 LAB — GLUCOSE, CAPILLARY: Glucose-Capillary: 175 mg/dL — ABNORMAL HIGH (ref 70–99)

## 2013-11-20 MED ORDER — MAGNESIUM SULFATE 40 MG/ML IJ SOLN
2.0000 g | Freq: Once | INTRAMUSCULAR | Status: AC
  Administered 2013-11-20: 2 g via INTRAVENOUS
  Filled 2013-11-20: qty 50

## 2013-11-20 MED ORDER — ACETAMINOPHEN 325 MG PO TABS
650.0000 mg | ORAL_TABLET | ORAL | Status: DC | PRN
  Administered 2013-11-26: 650 mg via ORAL
  Filled 2013-11-20 (×2): qty 2

## 2013-11-20 MED ORDER — ENSURE COMPLETE PO LIQD
237.0000 mL | Freq: Three times a day (TID) | ORAL | Status: DC
  Administered 2013-11-20 – 2013-11-25 (×6): 237 mL via ORAL

## 2013-11-20 MED ORDER — DILTIAZEM HCL 100 MG IV SOLR
5.0000 mg/h | INTRAVENOUS | Status: DC
  Administered 2013-11-20 – 2013-11-21 (×2): 5 mg/h via INTRAVENOUS
  Filled 2013-11-20: qty 100

## 2013-11-20 MED ORDER — METOPROLOL TARTRATE 1 MG/ML IV SOLN
5.0000 mg | Freq: Four times a day (QID) | INTRAVENOUS | Status: DC | PRN
  Administered 2013-11-20 – 2013-11-21 (×3): 5 mg via INTRAVENOUS
  Filled 2013-11-20 (×3): qty 5

## 2013-11-20 MED ORDER — SODIUM CHLORIDE 0.9 % IV SOLN: Freq: Once | INTRAVENOUS | Status: DC

## 2013-11-20 NOTE — Progress Notes (Signed)
Neb not given due to pt having tachycardia. RN aware and at bedside. Pt in no distress at this time.

## 2013-11-20 NOTE — Consult Note (Signed)
CARDIOLOGY CONSULT NOTE  Patient ID: [name], MRN: [id number], DOB/AGE: Apr 18, 1951 63 y.o. Admit date: 11/18/2013 Date of Consult: 11/20/2013  Primary Physician: Darlin Coco, MD Primary Cardiologist: Dr Mare Ferrari Referring Physician: Dr Doyle Askew  Chief Complaint: Pneumonia/Shortness of breath Reason for Consultation: abnormal EKG/tachycardia  HPI: 63 year-old woman with multiple medical problems is followed by Dr Mare Ferrari for PAF and hypertension. 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 She was hospitalized 8/4 with poor PO intake, nausea, and weakness. She is being treated for pneumonia and dehydration. Called to see patient because of abnormal EKG and tachycardia.   The patient denies chest pain, chest pressure, dyspnea at rest, orthopnea, PND, or edema. She has no palpitations. She has a hx of PAF that has occurred during prior hospitalizations. She has not been anticoagulated.   Medical History:  Past Medical History  Diagnosis Date  . Hypertension     She has a past hx of essential  . 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 Inflammation of joint of knee     Since we last last saw her she developed problems with an acute which required surgical drainage by her orthopedist Dr. Durward Fortes.  Marland Kitchen MRSA (methicillin resistant Staphylococcus aureus)     Knee surgery drainage was positive for MRSA and she was treated with 3 weeks of doxycycline successfully.  . Diarrhea     Mild  . Exogenous obesity   . GERD (gastroesophageal reflux disease)     2 hosp.- ischemic colitis - residual of Norovirus, 05/2011- sm. bowel obstruction  . Headache(784.0)     migraine headache on occas, less now than when she was younger   . Arthritis     L hip, back, neck   . History of blood transfusion sept 2013    04/2011- /w ischemic colitis , trouble with matching  blood  sept 2013  . Anemia     will see hematology consult prior to surgery, recommended by Dr. Mare Ferrari  . Anemia 12/15/2010  . Ischemic colitis 01/31/2012  . Atrial flutter     during hospitalization, 04/2011- related to anemia & illness/stress   . Pneumonia     04/2011- not hospitalized , pt. denies SOB, changes in chest, breathing  . 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 Dizziness     occasional  . B12 deficiency 02/27/2013  . 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 COPD (chronic obstructive pulmonary disease) 09/26/2013      Surgical History:  Past Surgical History  Procedure Laterality Date  . Knee surgery      I&D- 2008, post laceration   . Colonoscopy  05/16/2011    Procedure: COLONOSCOPY;  Surgeon: Winfield Cunas., MD;  Location: [facility];  Service: Endoscopy;  Laterality: N/A;  . Small intestine surgery  1992, 1999  . Laparotomy and lysis of adhesions    . Total hip arthroplasty  10/25/2011    Procedure: TOTAL HIP ARTHROPLASTY;  Surgeon: Garald Balding, MD;  Location: [place] [facility];  Service: Orthopedics;  Laterality: Left;  . Appendectomy  1962  . Abdominal hysterectomy  1988  . Partial colectomy and colostomy  sept 2013    mucous fistula done  . Partial colectomy  05/17/2012    Procedure: PARTIAL COLECTOMY;  Surgeon: Odis Hollingshead, MD;  Location: [facility];  Service: General;  Laterality: N/A;  . Colostomy closure  05/17/2012    Procedure: COLOSTOMY CLOSURE;  Surgeon: Odis Hollingshead, MD;  Location: [facility];  Service: General;  Laterality: N/A;  . Laparotomy  05/17/2012    Procedure: EXPLORATORY LAPAROTOMY;  Surgeon: Odis Hollingshead, MD;  Location: [facility];  Service: General;;  . Lysis of adhesion  05/17/2012    Procedure: LYSIS OF ADHESION;  Surgeon: Odis Hollingshead, MD;  Location: [facility];  Service: General;;  . Esophageal biopsy  05/17/2012    Procedure: BIOPSY;  Surgeon: Odis Hollingshead, MD;  Location: [facility];  Service: General;;  omental biopsy  . Laparotomy   05/22/2012    Procedure: EXPLORATORY LAPAROTOMY;  Surgeon: Odis Hollingshead, MD;  Location: [facility];  Service: General;  Laterality: N/A;  DRAINAGE  INTRA-ABDOMINAL ABSCESS/LYSIS OF ADHESIONSFOR SMALL BOWEL OBSTRUCTION/DIVERTING LOOP ILEOSTOMY     Home Meds: Prior to Admission medications   Medication Sig Start Date End Date Taking? Authorizing Provider  albuterol (PROVENTIL HFA;VENTOLIN HFA) 108 (90 BASE) MCG/ACT inhaler Inhale 1-2 puffs into the lungs every 6 (six) hours as needed for wheezing or shortness of breath.    Yes Historical Provider, MD  aspirin 325 MG EC tablet Take 325 mg by mouth every morning.    Yes Historical Provider, MD  bisoprolol (ZEBETA) 5 MG tablet Take 5 mg by mouth every morning.   Yes Historical Provider, MD  buPROPion (WELLBUTRIN XL) 300 MG 24 hr tablet Take 300 mg by mouth daily before breakfast.    Yes Historical Provider, MD  calcium carbonate (TUMS E-X 750) 750 MG chewable tablet Chew 1 tablet (750 mg total) by mouth 3 (three) times daily. 10/18/13  Yes Nishant Dhungel, MD  celecoxib (CELEBREX) 200 MG capsule Take 200 mg by mouth every morning.    Yes Historical Provider, MD  cyclobenzaprine (FLEXERIL) 10 MG tablet Take 10 mg by mouth 3 (three) times daily as needed for muscle spasms.  07/12/13  Yes Historical Provider, MD  diphenoxylate-atropine (LOMOTIL) 2.5-0.025 MG per tablet Take 1 tablet by mouth 4 (four) times daily. 11/01/13  Yes Odis Hollingshead, MD  esomeprazole (NEXIUM) 40 MG capsule Take 40 mg by mouth 2 (two) times daily before a meal.    Yes Historical Provider, MD  estradiol (ESTRACE) 2 MG tablet Take 2 mg by mouth daily before breakfast.    Yes Historical Provider, MD  HYDROcodone-acetaminophen (NORCO) 10-325 MG per tablet Take 1 tablet by mouth every 4 (four) hours as needed for moderate pain.  07/13/13  Yes Historical Provider, MD  loperamide (IMODIUM) 2 MG capsule Take 2 mg by mouth 4 (four) times daily.    Yes Historical Provider, MD    loratadine-pseudoephedrine (CLARITIN-D 12-HOUR) 5-120 MG per tablet Take 1 tablet by mouth daily as needed for allergies.    Yes Historical Provider, MD  nicotine (NICODERM CQ - DOSED IN MG/24 HOURS) 14 mg/24hr patch Place 1 patch (14 mg total) onto the skin daily. 10/18/13  Yes Nishant Dhungel, MD  ondansetron (ZOFRAN) 4 MG tablet Take 4 mg by mouth every 8 (eight) hours as needed for nausea or vomiting.   Yes Historical Provider, MD  promethazine (PHENERGAN) 25 MG tablet Take 25 mg by mouth every 6 (six) hours as needed for nausea or vomiting (nausea).   Yes Historical Provider, MD    Inpatient Medications:  . sodium chloride   Intravenous Once  . antiseptic oral rinse  7 mL Mouth Rinse BID  . aspirin  325  mg Oral q morning - 10a  . aztreonam  1 g Intravenous Q8H  . buPROPion  300 mg Oral QAC breakfast  . calcium citrate  500 mg of elemental calcium Oral QID  . chlorhexidine  15 mL Mouth Rinse BID  . Chlorhexidine Gluconate Cloth  6 each Topical Q0600  . cholecalciferol  1,000 Units Oral Daily  . enoxaparin (LOVENOX) injection  40 mg Subcutaneous Q24H  . estradiol  2 mg Oral QAC breakfast  . feeding supplement (ENSURE COMPLETE)  237 mL Oral TID BM  . ipratropium-albuterol  3 mL Nebulization QID  . levofloxacin (LEVAQUIN) IV  750 mg Intravenous Q48H  . magnesium sulfate 1 - 4 g bolus IVPB  2 g Intravenous Once  . mupirocin ointment  1 application Nasal BID  . nicotine  14 mg Transdermal Daily  . pantoprazole (PROTONIX) IV  40 mg Intravenous Daily   . dextrose 5 % 1,000 mL with sodium bicarbonate 150 mEq infusion 125 mL/hr at 11/19/13 1922    Allergies:  Allergies  Allergen Reactions  . Vancomycin Hives and Rash    wheezing  . Ativan [Lorazepam] Other (See Comments)    "makes me crazy"  . Codeine Nausea And Vomiting  . Tetanus Toxoids Other (See Comments)    Serum reaction  . Penicillins Hives and Rash  . Xarelto [Rivaroxaban] Hives and Rash    (May not be allergic.  Vancomycin was also being taken when reaction occurred.)    History   Social History  . Marital Status: Single    Spouse Name: N/A    Number of Children: 3  . Years of Education: N/A   Occupational History  .      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 Smoking status: Former Smoker -- 0.25 packs/day for 20 years    Types: Cigarettes    Quit date: 03/21/2013  . Smokeless tobacco: Never Used  . Alcohol Use: No     Comment: 1 - 2 glasses of wine per week  . Drug Use: No  . Sexual Activity: Not Currently   Other Topics Concern  . Not on file   Social History Narrative  . No narrative on file     Family History  Problem Relation Age of Onset  . Stroke Father   . Atrial fibrillation Father   . Hypertension Mother      Review of Systems: General: negative for chills, fever, night sweats   ENT: negative for rhinorrhea or epistaxis Cardiovascular: negative for chest pain, edema, orthopnea, palpitations, or paroxysmal nocturnal dyspnea. Positive for chronic DOE Dermatological: negative for rash Respiratory: negative for cough or wheezing GI: negative for nausea, vomiting, diarrhea, bright red blood per rectum, melena, or hematemesis GU: no hematuria, urgency, or frequency Neurologic: negative for visual changes, syncope, headache, or dizziness Heme: no easy bruising or bleeding Endo: negative for excessive thirst, thyroid disorder, or flushing Musculoskeletal: negative for joint pain or swelling, negative for myalgias  All other systems reviewed and are otherwise negative except as noted above.  Physical Exam: Blood pressure 140/80, pulse 117, temperature 99.1 F (37.3 C), temperature source Core (Comment), resp. rate 19, height 5\' 5"  (1.651 m), weight 123 lb 14.4 oz (56.2 kg), SpO2 100.00%. Pt is alert and oriented, in no distress. HEENT: normal Neck: JVP normal. Carotid upstrokes normal with right carotid bruit. No  thyromegaly. Lungs: equal expansion, clear bilaterally CV: Apex is discrete and nondisplaced, tachy  and irregular without murmur Abd: soft, NT, +BS, no bruit, no hepatosplenomegaly Back: no CVA tenderness Ext: no C/C/E        DP/PT pulses intact and = Skin: warm and dry without rash Neuro: CNII-XII intact             Strength intact = bilaterally    Labs:  Recent Labs  11/19/13 1000 11/19/13 1605 11/19/13 2200  TROPONINI <0.30 <0.30 <0.30   Lab Results  Component Value Date   WBC 9.7 11/20/2013   HGB 6.7* 11/20/2013   HCT 19.6* 11/20/2013   MCV 91.6 11/20/2013   PLT 188 11/20/2013    Recent Labs Lab 11/20/13 0344  NA 134*  K 3.7  CL 100  CO2 18*  BUN 27*  CREATININE 1.13*  CALCIUM 5.9*  PROT 5.8*  BILITOT 0.4  ALKPHOS 571*  ALT 9  AST 16  GLUCOSE 165*   Lab Results  Component Value Date   CHOL 108 05/28/2012   HDL 92.50 12/15/2010   LDLCALC  Value: 115        Total Cholesterol/HDL:CHD Risk Coronary Heart Disease Risk Table                     Men   Women  1/2 Average Risk   3.4   3.3* 07/20/2007   TRIG 105 05/28/2012   Lab Results  Component Value Date   DDIMER 2.56* 01/04/2012    Radiology/Studies:  Dg Chest 2 View  11/18/2013   CLINICAL DATA:  Hypoxia  EXAM: CHEST  2 VIEW  COMPARISON:  October 15, 2013  FINDINGS: There is a 1.5 x 1.5 cm nodular opacity in the right lower lobe. There are scattered areas of mild scarring bilaterally. There is no edema or consolidation. Heart is upper normal in size with pulmonary vascularity within normal limits. No adenopathy. No bone lesions.  IMPRESSION: 1.5 x 1.5 cm nodular opacity right lower lobe. This finding warrants noncontrast enhanced chest CT to further evaluate.  No edema or consolidation.   Electronically Signed   By: Lowella Grip M.D.   On: 11/18/2013 21:10   Ct Chest Wo Contrast  11/18/2013   CLINICAL DATA:  Abnormal chest x-ray.  Rule out mass lesion  EXAM: CT CHEST WITHOUT CONTRAST  TECHNIQUE: Multidetector CT imaging  of the chest was performed following the standard protocol without IV contrast.  COMPARISON:  Chest x-ray 11/18/2013  FINDINGS: Multiple ill-defined nodular densities in the right upper lobe and right lower lobe. Confluent infiltrate in the right posterior lung base is present consistent with pneumonia. Other nodules may also be infectious however tumor not excluded.  Mild patchy nodular infiltrates in the left upper lobe and left lower lobe.  Negative for pleural effusion. Negative for adenopathy. Esophagus is dilated and fluid-filled. No acute bony change.  IMPRESSION: Numerous upper and lower lobe nodular densities bilaterally, right greater than left. Favor diffuse pneumonitis however metastatic disease cannot be excluded. Correlate with fever and white count. Followup CT after antibiotic treatment would be helpful to assure clearing.   Electronically Signed   By: Franchot Gallo M.D.   On: 11/18/2013 23:24   Dg Chest Port 1 View  11/20/2013   CLINICAL DATA:  Pneumonia.  EXAM: PORTABLE CHEST - 1 VIEW  COMPARISON:  11/19/2013  FINDINGS: No cardiomegaly. Unchanged upper mediastinal contours, including aortic tortuosity.  Left IJ central line, tip at the SVC origin.  Nodular airspace disease in the lower lungs is similar to previous.  There may have been slight progression at the left base, although not convincing. No effusion. No pneumothorax.  IMPRESSION: Bibasilar pneumonia without significant change since yesterday.   Electronically Signed   By: Jorje Guild M.D.   On: 11/20/2013 06:17   Dg Chest Port 1 View  11/19/2013   CLINICAL DATA:  Central line placement.  EXAM: PORTABLE CHEST - 1 VIEW  COMPARISON:  CT and chest x-ray of 11/18/2013.  FINDINGS: Left IJ line in good anatomic position. Mediastinum and hilar structures are unremarkable. Cardiovascular structures are unremarkable. Patchy nodular pulmonary infiltrates noted, better demonstrated by prior chest CT of 11/18/2013. No pleural effusion or  pneumothorax. No acute bony abnormality.  IMPRESSION: 1. Patchy nodular bilateral infiltrates suggesting pneumonia again noted. These are better demonstrated on prior CT of 11/18/2013. Again follow-up is needed to ensure complete clearing to exclude metastatic disease. Reference is made to CT report of 11/18/2013. 2. Left IJ line in good anatomic position.   Electronically Signed   By: Marcello Moores  Register   On: 11/19/2013 10:17    EKG: atrial fib with RVR  Cardiac Studies: 2D Echo: Study Conclusions  - Left ventricle: The cavity size was normal. Systolic function was normal. The estimated ejection fraction was in the range of 60% to 65%. Wall motion was normal; there were no regional wall motion abnormalities. Doppler parameters are consistent with abnormal left ventricular relaxation (grade 1 diastolic dysfunction). Doppler parameters are consistent with elevated ventricular end-diastolic filling pressure. - Ascending aorta: The ascending aorta was mildly dilated measuring 41 mm. - Mitral valve: Calcified annulus. - Left atrium: The atrium was normal in size. - Right atrium: The atrium was normal in size. - Tricuspid valve: There was no regurgitation. - Pulmonary arteries: Systolic pressure was within the normal range.  Impressions:  - The ascending aorta was mildly dilated measuring 41 mm, unchanged from the study on 12/21/2012.  ASSESSMENT AND PLAN:  1. Atrial fib with RVR 2. Acute respiratory failure, improved - treated for pneumonia 3. SIRS - now on broad spectrum ABX with improved BP 4. COPD 5. Malnutrition  Suspect stress of acute illness, holding of beta blocker (necessary because of hypotension) has resulted in AF with RVR in patient with past hx of this. Would start IV metoprolol as you have done. Add cardizem if she remains tachycardic. She is not an appropriate candidate for anticoagulation because of chronic anemia/multiple comorbidities (see Dr Sherryl Barters last office  note).   Will follow with you. thx   Signed, Sherren Mocha MD, [facility] 11/20/2013, 3:12 PM

## 2013-11-20 NOTE — Progress Notes (Signed)
     Still with bursts of tachycardia 150-170s. Asymptomatic. Will start dilt gtt. Monitor BP closely.    Perry Mount PA-C  [facility]

## 2013-11-20 NOTE — Consult Note (Signed)
PULMONARY / CRITICAL CARE MEDICINE   Name: [name] MRN: [id number] DOB: [birth year]    ADMISSION DATE:  11/18/2013 CONSULTATION DATE: 8/4  REFERRING MD : [facility]  CHIEF COMPLAINT:  Poor PO intake  INITIAL PRESENTATION: Dehydration/shock  STUDIES:  8/4 procal>>.19 8/4 trop i>><.30 CT chest 8/3 >> nodular infiltrates bilaterally, R>L; ? Infectious vs other  SIGNIFICANT EVENTS: 8/4 tx to icu shock, metabolic disarray   HISTORY OF PRESENT ILLNESS:   She was in her usual state of poor health till 4 days ago(7/31) when she developed nausea(no vomiting) decreased PO intake. These symptoms were exacerbated by recent Neupogen inject for chronic anemia. She presented to [facility] 8/3 , hypotensive, poorly compensated metabolic acidosis (ph 5.8,IFO2 18, bicarb 6.8) along with renal failure base creatine .85, 2.23 on admit. She has no IV assess and her CT of chest revealed multiple pulmonary nodules. PCCM asked to consult, place cvl and assist in managing her care.   SUBJECTIVE:  In less distress today  VITAL SIGNS: Temp:  [98.6 F (37 C)-100 F (37.8 C)] 99.7 F (37.6 C) (08/05 1001) Pulse Rate:  [30-117] 117 (08/05 0900) Resp:  [10-19] 15 (08/05 1001) BP: (118-157)/(67-91) 156/86 mmHg (08/05 1000) SpO2:  [93 %-100 %] 98 % (08/05 1001) Weight:  [123 lb 14.4 oz (56.2 kg)] 123 lb 14.4 oz (56.2 kg) (08/05 0514) HEMODYNAMICS: CVP:  [7 mmHg-12 mmHg] 8 mmHg VENTILATOR SETTINGS:   INTAKE / OUTPUT:  Intake/Output Summary (Last 24 hours) at 11/20/13 1028 Last data filed at 11/20/13 1000  Gross per 24 hour  Intake 4087.92 ml  Output   2450 ml  Net 1637.92 ml    PHYSICAL EXAMINATION: General:  Frail, ill appearing , wasted female, more alert Neuro:  Alert and follows commands HEENT: No JVD/LAN ,  HSr RRR Lungs:  diminished in bases, + rhonchi Abdomen:  Dressing intact, ileostomy with drainage  Musculoskeletal:  Wasted muscles Skin:  warm  LABS:  CBC  Recent Labs Lab  11/18/13 1945 11/19/13 0615 11/20/13 0344  WBC 19.7* 14.9* 9.7  HGB 10.1* 8.4* 6.7*  HCT 29.7* 25.1* 19.6*  PLT 315 222 188   Coag's No results found for this basename: APTT, INR,  in the last 168 hours BMET  Recent Labs Lab 11/19/13 0615 11/19/13 2100 11/20/13 0344  NA 132* 134* 134*  K 5.0 3.4* 3.7  CL 105 102 100  CO2 9* 14* 18*  BUN 46* 34* 27*  CREATININE 1.80* 1.29* 1.13*  GLUCOSE 89 173* 165*   Electrolytes  Recent Labs Lab 11/19/13 0615 11/19/13 1000 11/19/13 2100 11/20/13 0344  CALCIUM 5.8*  --  5.4* 5.9*  MG  --  0.7*  --  1.5  PHOS  --  4.2  --   --    Sepsis Markers  Recent Labs Lab 11/19/13 0615 11/19/13 1000 11/19/13 1015 11/20/13 0344  LATICACIDVEN 0.8  --  0.8  --   PROCALCITON  --  0.22  --  0.19   ABG  Recent Labs Lab 11/19/13 0335  PHART 7.205*  PCO2ART 17.7*  PO2ART 124.0*   Liver Enzymes  Recent Labs Lab 11/18/13 1945 11/19/13 1000 11/20/13 0344  AST 13 12 16   ALT 9 8 9   ALKPHOS 708* 588* 571*  BILITOT 0.4 0.4 0.4  ALBUMIN 3.3* 2.7* 2.4*   Cardiac Enzymes  Recent Labs Lab 11/19/13 1000 11/19/13 1605 11/19/13 2200  TROPONINI <0.30 <0.30 <0.30   Glucose No results found for this basename: GLUCAP,  in the last  168 hours  Imaging Dg Chest Port 1 View  11/19/2013   CLINICAL DATA:  Central line placement.  EXAM: PORTABLE CHEST - 1 VIEW  COMPARISON:  CT and chest x-ray of 11/18/2013.  FINDINGS: Left IJ line in good anatomic position. Mediastinum and hilar structures are unremarkable. Cardiovascular structures are unremarkable. Patchy nodular pulmonary infiltrates noted, better demonstrated by prior chest CT of 11/18/2013. No pleural effusion or pneumothorax. No acute bony abnormality.  IMPRESSION: 1. Patchy nodular bilateral infiltrates suggesting pneumonia again noted. These are better demonstrated on prior CT of 11/18/2013. Again follow-up is needed to ensure complete clearing to exclude metastatic disease. Reference  is made to CT report of 11/18/2013. 2. Left IJ line in good anatomic position.   Electronically Signed   By: Marcello Moores  Register   On: 11/19/2013 10:17     ASSESSMENT / PLAN:  PULMONARY OETT>>none  A: Hypoxia Pulmonary nodules, suspect HCAP but consider other causes COPD with continued tobacco use P:   O2, BD's Would treat as suspected HCAP, plan to follow repeat chest imaging, probably repeat CT in 4-6 weeks to look for clearance. If nodularity persists then favor FOB or biopsies. If she decompensates then will perform FOB sooner for cx data Smoking cessation  CARDIOVASCULAR L IJ CVC 8/4>> A: Shock (resolved 8/5) Presumed dehydration but consider sepsis - Not on home steroids P:  Dc'd antihypertensives, restart when able Monitor cvp x 48 hours and volume resuscitate Empiric abx as below  RENAL A:  Acute renal failure, likely ATN from hypovolemia, improving Mixed AG and non-AG metabolic acidosis (delta ratio 0.4 on 8/4). Suspect due to high-output ileostomy and GI losses + acute renal failure. Improving 8/5 P:   Hydration Avoid nephrotoxins Bicarb drip started 8/4 at 125, co2 18 8/5, continue bicarb for now, consider PO bicarb in future if short gut allows absorption   GASTROINTESTINAL A:   Short gut syndrome Hx Ischemic bowel with ileostomy Open abd wound  P:   CCS consult Consider short term TNA depending on ability to maintain PO calories and fluids  HEMATOLOGIC A:   CMML Anemia  P:  Consider Hem/onc consult  INFECTIOUS A:   Possible UTI Probable HCAP, R>L P:   BCx2 8/3>> UC 8/3>> Abx: azactam, start date8/3, day 3/x Abx: levaquin, start date8/3, day 3/x  Consider Zyvox (vanc allergy) in immunocompromised pt if she decompensates  ENDOCRINE A:  No acute issue P:     NEUROLOGIC A:  No focal defects Mild confusion from illness state P:   RASS goal: 0   TODAY'S SUMMARY:  Improved 8/5, off pressors, continue bicarb drip /abxand follow  labs.   Richardson Landry Minor ACNP Maryanna Shape PCCM Pager [phone] till 3 pm If no answer page [phone] 11/20/2013, 10:28 AM   Baltazar Apo, MD, PhD 11/20/2013, 10:59 AM [facility] [phone] or if no answer [phone]

## 2013-11-20 NOTE — Progress Notes (Signed)
Patient ID: [name], female   DOB: 1950-08-06, 63 y.o.   MRN: [id number]  [facility] PROGRESS NOTE  [name] AOZ:[id number] DOB: 1950/10/30 DOA: 11/18/2013 PCP: Darlin Coco, MD  Brief narrative: Pt is 63 yo female with HTN CMML, with ileostomy , COPD, anemia of chronic disease, presented to [facility] ED with several days duration of progressively worsening nausea with no vomiting, poor oral intake. In ED, pt noted to be hypotensive with poor compensated metabolic acidosis (ph 9.6,EXB2 18, bicarb 6.8) along with renal failure Cr 2.23 (baseline Cr <1), CT of chest revealed multiple pulmonary nodules. PCCM asked to consult, place cvl and assist in managing her care.   Significant events since admission:  8/4 --> transferred to ICU, placed L IJ CVC   Principal Problem:   Acute hypoxic respiratory distress - with pulmonary nodules c/w PNA, suspect HCAP but underlying malignancy not excluded so needs close follow up to ensure resolution   - pt also with COPD and ongoing tobacco abuse  - pt is clinically improving but still requiring oxygen via [place], WBC trending down and WNL this AM, Tmax 99.7 F over the past 24 hours  - continue to provide supportive care with BD's, oxygen via [place] - needs repeat CT in 4-6 weeks to ensure clearance  - if nodularity persists, will need additional evaluation, with FOB Active Problems:   Septic shock, from HCAP, ? UTI - resolved, lactic acid is WNL, vital signs stable  - continue current ABX Aztreonam and Levaquin day #3 - continue IVF with bicarb    ARF (acute renal failure) - likely from hypovolemia and developing ATN  - Cr is improving and trending down - repeat BMP in AM   Mixed AG and non-AG metabolic acidosis (delta ratio 0.4 on 8/4) - Suspect due to high-output ileostomy and GI losses + acute renal failure - improving, AG 16 this AM - continue IVF, bicarb drip    Hypocalcemia - still low, continue to supplement and repeat BMP in AM  Hypokalemia with hypomagnesemia - K is stable and WNL this AM - will add Mg 2 gm IV as it is 1.5 this AM   Open abdominal wall wound - with iliostomy in place and with drainage   Anemia of other chronic disease - 10 --> 8.4 --> 6.7 - transfuse 2 U PRBC, repeat CBC in AM - hemoccult stool pending    COPD (chronic obstructive pulmonary disease) - continue BD's scheduled and as needed - continue to provide nicotine patch    Protein-calorie malnutrition, severe - advance diet as pt able to tolerate   Consultants:  PCCM Procedures/Studies: CXR 11/18/2013  1.5 x 1.5 cm nodular opacity right lower lobe. No edema or consolidation.   CXR 11/20/2013  Bibasilar pneumonia without significant change since yesterday.   CXR 11/19/2013  Nodular infiltrates, c/w PNA. CT chest  11/18/2013   Numerous upper and lower lobe nodular densities bilaterally, R>L, ? diffuse pneumonitis vsmetastatic  Antibiotics:  Aztreonam 8/3 -->  Levaquin 8/3 -->   Code Status: Full Family Communication: Pt at bedside Disposition Plan: Remains inpatient   HPI/Subjective: No events overnight.   Objective: Filed Vitals:   11/20/13 0900 11/20/13 0901 11/20/13 1000 11/20/13 1001  BP: 157/84  156/86   Pulse: 117     Temp: 99.5 F (37.5 C)  99.7 F (37.6 C) 99.7 F (37.6 C)  TempSrc: Core (Comment)  Core (Comment)   Resp: 18  11 15   Height:  Weight:      SpO2: 95% 97% 95% 98%    Intake/Output Summary (Last 24 hours) at 11/20/13 1148 Last data filed at 11/20/13 1000  Gross per 24 hour  Intake 3002.5 ml  Output   2450 ml  Net  552.5 ml    Exam:   General:  Pt is more alert, follows commands appropriately, chronically ill appearing   Cardiovascular: Regular rhythm, tachycardic,,no rubs, no gallops  Respiratory: Bilateral rhonchi and diminished air movement at bases  Abdomen: Soft, non tender, non distended, bowel sounds present, no guarding  Extremities: No edema, pulses DP and PT palpable  bilaterally   Data Reviewed: Basic Metabolic Panel:  Recent Labs Lab 11/18/13 1945 11/19/13 0615 11/19/13 1000 11/19/13 2100 11/20/13 0344  NA 131* 132*  --  134* 134*  K 5.6* 5.0  --  3.4* 3.7  CL 101 105  --  102 100  CO2 9* 9*  --  14* 18*  GLUCOSE 103* 89  --  173* 165*  BUN 51* 46*  --  34* 27*  CREATININE 2.23* 1.80*  --  1.29* 1.13*  CALCIUM 6.8* 5.8*  --  5.4* 5.9*  MG  --   --  0.7*  --  1.5  PHOS  --   --  4.2  --   --    Liver Function Tests:  Recent Labs Lab 11/18/13 1945 11/19/13 1000 11/20/13 0344  AST 13 12 16   ALT 9 8 9   ALKPHOS 708* 588* 571*  BILITOT 0.4 0.4 0.4  PROT 8.0 6.7 5.8*  ALBUMIN 3.3* 2.7* 2.4*   CBC:  Recent Labs Lab 11/18/13 1945 11/19/13 0615 11/20/13 0344  WBC 19.7* 14.9* 9.7  NEUTROABS 13.0*  --  4.9  HGB 10.1* 8.4* 6.7*  HCT 29.7* 25.1* 19.6*  MCV 98.0 96.5 91.6  PLT 315 222 188   Cardiac Enzymes:  Recent Labs Lab 11/19/13 1000 11/19/13 1605 11/19/13 2200  TROPONINI <0.30 <0.30 <0.30   Recent Results (from the past 240 hour(s))  CULTURE, BLOOD (ROUTINE X 2)     Status: None   Collection Time    11/18/13 11:25 PM      Result Value Ref Range Status   Specimen Description BLOOD L ARM   Final   Special Requests BOTTLES DRAWN AEROBIC AND ANAEROBIC 5CC   Final   Culture  Setup Time     Final   Value: 11/19/2013 03:53     Performed at [facility]   Culture     Final   Value:        BLOOD CULTURE RECEIVED NO GROWTH TO DATE CULTURE WILL BE HELD FOR 5 DAYS BEFORE ISSUING A FINAL NEGATIVE REPORT     Performed at [facility]   Report Status PENDING   Incomplete  CULTURE, BLOOD (ROUTINE X 2)     Status: None   Collection Time    11/18/13 11:30 PM      Result Value Ref Range Status   Specimen Description BLOOD LEFT ANTECUBITAL   Final   Special Requests BOTTLES DRAWN AEROBIC AND ANAEROBIC 5CC   Final   Culture  Setup Time     Final   Value: 11/19/2013 03:54     Performed at [facility]    Culture     Final   Value:        BLOOD CULTURE RECEIVED NO GROWTH TO DATE CULTURE WILL BE HELD FOR 5 DAYS BEFORE ISSUING A FINAL NEGATIVE  REPORT     Performed at [facility]   Report Status PENDING   Incomplete  MRSA PCR SCREENING     Status: Abnormal   Collection Time    11/19/13  2:45 AM      Result Value Ref Range Status   MRSA by PCR POSITIVE (*) NEGATIVE Final   Comment:            The GeneXpert MRSA Assay (FDA     approved for NASAL specimens     only), is one component of a     comprehensive MRSA colonization     surveillance program. It is not     intended to diagnose MRSA     infection nor to guide or     monitor treatment for     MRSA infections.     RESULT CALLED TO, READ BACK BY AND VERIFIED WITH:     JOHNSON RN AT 0510 ON 08.04.15 BY SHUEA     Scheduled Meds: . aspirin  325 mg Oral q morning - 10a  . aztreonam  1 g Intravenous Q8H  . buPROPion  300 mg Oral QAC breakfast  . calcium citrate  500 mg  Oral QID  . cholecalciferol  1,000 Units Oral Daily  . enoxaparin injection  40 mg Subcutaneous Q24H  . estradiol  2 mg Oral QAC breakfast  . ipratropium-albuterol  3 mL Nebulization QID  . levofloxacin IV  750 mg Intravenous Q48H  . nicotine  14 mg Transdermal Daily  . pantoprazole  IV  40 mg Intravenous Daily   Continuous Infusions: . dextrose 5 % 1,000 mL with sodium bicarbonate 150 mEq infusion 125 mL/hr at 11/19/13 Marjory Lies, MD  [facility] Pager [phone]  If 7PM-7AM, please contact night-coverage [email or web address] Password [email or web address] 11/20/2013, 11:48 AM   LOS: 2 days

## 2013-11-20 NOTE — Progress Notes (Signed)
Begin having bursts of Afib with  RVR 150-170. Totally asymptomatic. Stable BP, sleeping, no WOB.Was evaluated by Dr. Burt Knack, CARDS, this afternoon. Little response to IV Lopressor given at 1615; HR 110-120. History of Atrial Flutter (2013).  Cardizem infusion started at 1900. Now in ST at 140. Temp core 38. Elink updated. Continue close observation.

## 2013-11-20 NOTE — Progress Notes (Signed)
Dr Garwin Brothers notifed of rhythm and rare change earlier.Calcium for PTH of 5.3 drawn 11/19/13 has since been replaced

## 2013-11-20 NOTE — Progress Notes (Signed)
INITIAL NUTRITION ASSESSMENT  Pt meets criteria for severe MALNUTRITION in the context of chronic illness as evidenced by <75% estimated energy intake with 6.8% weight loss in the past month.  DOCUMENTATION CODES Per approved criteria  -Severe malnutrition in the context of chronic illness   INTERVENTION: - Ensure Complete TID - Encouraged increased meal intake as nausea improves - RD to continue to monitor  NUTRITION DIAGNOSIS: Inadequate oral intake related to nausea as evidenced by pt report.   Goal: 1. Resolution of nausea 2. Pt to consume >90% of meals/supplements  Monitor:  Weights, labs, intake, nausea   Reason for Assessment: Malnutrition screening tool   63 y.o. female  Admitting Dx: Acute respiratory distress  ASSESSMENT: Pt was in her usual state of poor health till 4 days ago(7/31) when she developed nausea(no vomiting) decreased PO intake. These symptoms were exacerbated by recent Neupogen inject for chronic anemia. She presented to [facility] 8/3 , hypotensive, poorly compensated metabolic acidosis along with renal failure base creatine. CT of chest revealed multiple pulmonary nodules. 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   - Met with pt who c/o nausea x 2 weeks that worsened with any solid food intake, attributes the Neupogen as the source of her nausea - Was consuming lots of fluids like water and diet drinks - Weight down 9 pounds in the past month - Prior to 2 weeks ago she was eating 2 good meals/day - Was not on Ensure because she wanted to get her extra nutrition by snacking rather than nutritional supplements - Unable to perform nutrition focused physical exam as pt c/o feeling uncomfortable, notified RN    Height: Ht Readings from Last 1 Encounters:  11/19/13 _0  (1.651 m)    Weight: Wt Readings from Last 1 Encounters:  11/20/13 123 lb 14.4 oz (56.2 kg)    Ideal Body Weight: 125 lbs   %  Ideal Body Weight: 98%  Wt Readings from Last 10 Encounters:  11/20/13 123 lb 14.4 oz (56.2 kg)  10/22/13 132 lb 4.8 oz (60.011 kg)  10/15/13 118 lb (53.524 kg)  10/15/13 118 lb 3.2 oz (53.615 kg)  09/26/13 124 lb 9.6 oz (56.518 kg)  08/28/13 138 lb (62.596 kg)  08/22/13 136 lb 11.2 oz (62.007 kg)  08/21/13 135 lb 12.8 oz (61.598 kg)  07/22/13 135 lb 3.2 oz (61.326 kg)  05/22/13 139 lb (63.05 kg)    Usual Body Weight: 132 lbs last month  % Usual Body Weight: 93%  BMI:  Body mass index is 20.62 kg/(m^2).  Estimated Nutritional Needs: Kcal: 1650-1850 Protein: 85-105g Fluid: per MD  Skin: deep tissue injury on coccyx, chronic open lower abdominal wound  Diet Order: General  EDUCATION NEEDS: -No education needs identified at this time   Intake/Output Summary (Last 24 hours) at 11/20/13 1110 Last data filed at 11/20/13 1000  Gross per 24 hour  Intake 3052.5 ml  Output   2450 ml  Net  602.5 ml    Last BM: 8/4  Labs:   Recent Labs Lab 11/19/13 0615 11/19/13 1000 11/19/13 2100 11/20/13 0344  NA 132*  --  134* 134*  K 5.0  --  3.4* 3.7  CL 105  --  102 100  CO2 9*  --  14* 18*  BUN 46*  --  34* 27*  CREATININE 1.80*  --  1.29* 1.13*  CALCIUM 5.8*  --  5.4* 5.9*  MG  --  0.7*  --  1.5  PHOS  --  4.2  --   --   GLUCOSE 89  --  173* 165*    CBG (last 3)  No results found for this basename: GLUCAP,  in the last 72 hours  Scheduled Meds: . sodium chloride   Intravenous Once  . antiseptic oral rinse  7 mL Mouth Rinse BID  . aspirin  325 mg Oral q morning - 10a  . aztreonam  1 g Intravenous Q8H  . aztreonam  2 g Intravenous Once  . buPROPion  300 mg Oral QAC breakfast  . calcium citrate  500 mg of elemental calcium Oral QID  . chlorhexidine  15 mL Mouth Rinse BID  . Chlorhexidine Gluconate Cloth  6 each Topical Q0600  . cholecalciferol  1,000 Units Oral Daily  . enoxaparin (LOVENOX) injection  40 mg Subcutaneous Q24H  . estradiol  2 mg Oral QAC  breakfast  . ipratropium-albuterol  3 mL Nebulization QID  . levofloxacin (LEVAQUIN) IV  750 mg Intravenous Q48H  . mupirocin ointment  1 application Nasal BID  . nicotine  14 mg Transdermal Daily  . pantoprazole (PROTONIX) IV  40 mg Intravenous Daily    Continuous Infusions: . dextrose 5 % 1,000 mL with sodium bicarbonate 150 mEq infusion 125 mL/hr at 11/19/13 3329    Past Medical History  Diagnosis Date  . Hypertension     She has a past hx of essential  . 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 Inflammation of joint of knee     Since we last last saw her she developed problems with an acute which required surgical drainage by her orthopedist Dr. Durward Fortes.  Marland Kitchen MRSA (methicillin resistant Staphylococcus aureus)     Knee surgery drainage was positive for MRSA and she was treated with 3 weeks of doxycycline successfully.  . Diarrhea     Mild  . Exogenous obesity   . GERD (gastroesophageal reflux disease)     2 hosp.- ischemic colitis - residual of Norovirus, 05/2011- sm. bowel obstruction  . Headache(784.0)     migraine headache on occas, less now than when she was younger   . Arthritis     L hip, back, neck   . History of blood transfusion sept 2013    04/2011- /w ischemic colitis , trouble with matching blood  sept 2013  . Anemia     will see hematology consult prior to surgery, recommended by Dr. Mare Ferrari  . Anemia 12/15/2010  . Ischemic colitis 01/31/2012  . Atrial flutter     during hospitalization, 04/2011- related to anemia & illness/stress   . Pneumonia     04/2011- not hospitalized , pt. denies SOB, changes in chest, breathing  . 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 Dizziness     occasional  . B12 deficiency 02/27/2013  . 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 COPD (chronic obstructive pulmonary disease) 09/26/2013    Past Surgical History  Procedure Laterality Date  . Knee surgery       I&D- 2008, post laceration   . Colonoscopy  05/16/2011    Procedure: COLONOSCOPY;  Surgeon: Winfield Cunas., MD;  Location: [facility];  Service: Endoscopy;  Laterality: N/A;  . Small intestine surgery  1992, 1999  . Laparotomy and lysis of adhesions    . Total hip arthroplasty  10/25/2011    Procedure:  TOTAL HIP ARTHROPLASTY;  Surgeon: Garald Balding, MD;  Location: [facility];  Service: Orthopedics;  Laterality: Left;  . Appendectomy  1962  . Abdominal hysterectomy  1988  . Partial colectomy and colostomy  sept 2013    mucous fistula done  . Partial colectomy  05/17/2012    Procedure: PARTIAL COLECTOMY;  Surgeon: Odis Hollingshead, MD;  Location: [facility];  Service: General;  Laterality: N/A;  . Colostomy closure  05/17/2012    Procedure: COLOSTOMY CLOSURE;  Surgeon: Odis Hollingshead, MD;  Location: [facility];  Service: General;  Laterality: N/A;  . Laparotomy  05/17/2012    Procedure: EXPLORATORY LAPAROTOMY;  Surgeon: Odis Hollingshead, MD;  Location: [facility];  Service: General;;  . Lysis of adhesion  05/17/2012    Procedure: LYSIS OF ADHESION;  Surgeon: Odis Hollingshead, MD;  Location: [facility];  Service: General;;  . Esophageal biopsy  05/17/2012    Procedure: BIOPSY;  Surgeon: Odis Hollingshead, MD;  Location: [facility];  Service: General;;  omental biopsy  . Laparotomy  05/22/2012    Procedure: EXPLORATORY LAPAROTOMY;  Surgeon: Odis Hollingshead, MD;  Location: [facility];  Service: General;  Laterality: N/A;  DRAINAGE  INTRA-ABDOMINAL ABSCESS/LYSIS OF ADHESIONSFOR SMALL BOWEL OBSTRUCTION/DIVERTING LOOP ILEOSTOMY    Carlis Stable MS, RD, LDN [phone] Pager [phone] Weekend/After Hours Pager

## 2013-11-20 NOTE — Progress Notes (Addendum)
Observed cardiac changes on monitor. Performed EKG. Paged MD Doyle Askew 1:50pm.   Cardiology consult requested, pt placed on metoprolol 5 mg IV Q6 hours PRN given HR in 130 - 140's. Will follow up on cardiology recommendations.  Faye Ramsay, MD  [facility] Pager [phone] Cell [phone]  If 7PM-7AM, please contact night-coverage [email or web address] Password [email or web address]

## 2013-11-21 ENCOUNTER — Encounter (INDEPENDENT_AMBULATORY_CARE_PROVIDER_SITE_OTHER): Payer: Self-pay | Admitting: General Surgery

## 2013-11-21 DIAGNOSIS — I4891 Unspecified atrial fibrillation: Secondary | ICD-10-CM

## 2013-11-21 DIAGNOSIS — A4902 Methicillin resistant Staphylococcus aureus infection, unspecified site: Secondary | ICD-10-CM

## 2013-11-21 DIAGNOSIS — Z113 Encounter for screening for infections with a predominantly sexual mode of transmission: Secondary | ICD-10-CM

## 2013-11-21 DIAGNOSIS — J449 Chronic obstructive pulmonary disease, unspecified: Secondary | ICD-10-CM

## 2013-11-21 DIAGNOSIS — R799 Abnormal finding of blood chemistry, unspecified: Secondary | ICD-10-CM

## 2013-11-21 DIAGNOSIS — R911 Solitary pulmonary nodule: Secondary | ICD-10-CM

## 2013-11-21 LAB — PROCALCITONIN: Procalcitonin: 0.13 ng/mL

## 2013-11-21 LAB — CBC WITH DIFFERENTIAL/PLATELET
BASOS ABS: 0 10*3/uL (ref 0.0–0.1)
Basophils Relative: 0 % (ref 0–1)
EOS PCT: 0 % (ref 0–5)
Eosinophils Absolute: 0 10*3/uL (ref 0.0–0.7)
HCT: 24.3 % — ABNORMAL LOW (ref 36.0–46.0)
Hemoglobin: 8.4 g/dL — ABNORMAL LOW (ref 12.0–15.0)
Lymphocytes Relative: 28 % (ref 12–46)
Lymphs Abs: 2.1 10*3/uL (ref 0.7–4.0)
MCH: 31.7 pg (ref 26.0–34.0)
MCHC: 34.6 g/dL (ref 30.0–36.0)
MCV: 91.7 fL (ref 78.0–100.0)
Monocytes Absolute: 2.2 10*3/uL — ABNORMAL HIGH (ref 0.1–1.0)
Monocytes Relative: 29 % — ABNORMAL HIGH (ref 3–12)
Neutro Abs: 3.2 10*3/uL (ref 1.7–7.7)
Neutrophils Relative %: 43 % (ref 43–77)
PLATELETS: 154 10*3/uL (ref 150–400)
RBC: 2.65 MIL/uL — ABNORMAL LOW (ref 3.87–5.11)
RDW: 19.3 % — AB (ref 11.5–15.5)
WBC: 7.6 10*3/uL (ref 4.0–10.5)

## 2013-11-21 LAB — BASIC METABOLIC PANEL
Anion gap: 9 (ref 5–15)
BUN: 13 mg/dL (ref 6–23)
CALCIUM: 5.9 mg/dL — AB (ref 8.4–10.5)
CO2: 31 mEq/L (ref 19–32)
Chloride: 95 mEq/L — ABNORMAL LOW (ref 96–112)
Creatinine, Ser: 0.77 mg/dL (ref 0.50–1.10)
GFR calc Af Amer: >90 mL/min (ref >90)
GFR, EST NON AFRICAN AMERICAN: 87 mL/min — AB (ref >90)
Glucose, Bld: 136 mg/dL — ABNORMAL HIGH (ref 70–99)
Potassium: 2.9 mEq/L — CL (ref 3.7–5.3)
SODIUM: 135 meq/L — AB (ref 137–147)

## 2013-11-21 LAB — TYPE AND SCREEN
ABO/RH(D): A POS
Antibody Screen: NEGATIVE
DONOR AG TYPE: NEGATIVE
Unit division: 0

## 2013-11-21 LAB — URINE CULTURE: Colony Count: >=100000

## 2013-11-21 MED ORDER — POTASSIUM CHLORIDE 10 MEQ/50ML IV SOLN
10.0000 meq | INTRAVENOUS | Status: AC
  Administered 2013-11-21 (×6): 10 meq via INTRAVENOUS
  Filled 2013-11-21 (×5): qty 50

## 2013-11-21 MED ORDER — SODIUM CHLORIDE 0.9 % IV SOLN
1.0000 g | Freq: Once | INTRAVENOUS | Status: AC
  Administered 2013-11-21: 1 g via INTRAVENOUS
  Filled 2013-11-21: qty 10

## 2013-11-21 MED ORDER — LEVALBUTEROL HCL 0.63 MG/3ML IN NEBU
0.6300 mg | INHALATION_SOLUTION | Freq: Three times a day (TID) | RESPIRATORY_TRACT | Status: DC
  Administered 2013-11-21 – 2013-11-23 (×7): 0.63 mg via RESPIRATORY_TRACT
  Filled 2013-11-21 (×10): qty 3

## 2013-11-21 MED ORDER — IPRATROPIUM BROMIDE 0.02 % IN SOLN
0.5000 mg | Freq: Three times a day (TID) | RESPIRATORY_TRACT | Status: DC
  Administered 2013-11-21 – 2013-11-23 (×7): 0.5 mg via RESPIRATORY_TRACT
  Filled 2013-11-21 (×7): qty 2.5

## 2013-11-21 MED ORDER — METOPROLOL TARTRATE 1 MG/ML IV SOLN
2.5000 mg | INTRAVENOUS | Status: DC | PRN
  Administered 2013-11-21: 2.5 mg via INTRAVENOUS
  Filled 2013-11-21 (×2): qty 5

## 2013-11-21 MED ORDER — METOPROLOL TARTRATE 25 MG PO TABS
25.0000 mg | ORAL_TABLET | Freq: Four times a day (QID) | ORAL | Status: DC
  Administered 2013-11-21 – 2013-11-24 (×11): 25 mg via ORAL
  Filled 2013-11-21 (×15): qty 1

## 2013-11-21 MED ORDER — LINEZOLID 2 MG/ML IV SOLN
600.0000 mg | Freq: Two times a day (BID) | INTRAVENOUS | Status: DC
  Administered 2013-11-21 – 2013-11-25 (×9): 600 mg via INTRAVENOUS
  Filled 2013-11-21 (×11): qty 300

## 2013-11-21 NOTE — Progress Notes (Signed)
eLink Physician-Brief Progress Note Patient Name: [name] DOB: [birth year] MRN: [id number]  Date of Service  11/21/2013   HPI/Events of Note   BMET reviewed: hypokalemic, hypocalcemic, bicarb up  eICU Interventions  D/c bicarb gtt Give KCl, Calcium   Intervention Category Minor Interventions: Electrolytes abnormality - evaluation and management  MCQUAID, DOUGLAS 11/21/2013, 6:36 AM

## 2013-11-21 NOTE — Progress Notes (Signed)
CRITICAL VALUE ALERT  Critical value received:  K 2.9 Calcium 5.9  Date of notification:  8/6  Time of notification:  0620  Critical value read back:Yes.    Nurse who received alert:  Darrin Nipper  MD notified (1st page):  Warren Lacy

## 2013-11-21 NOTE — Progress Notes (Addendum)
CRITICAL VALUE ALERT  Critical value received:  Greater than 100,000 colonies of mrsa in urine    Date of notification:  11/21/2013  Time of notification: 0840  Critical value read back:yes Nurse who received alert:SDillon RN  MD notified (1st page):  Dr Doyle Askew  Time of first page:  [phone] came to pt's room   MD notified (2nd page):  Time of second page:  Responding MD:  Time MD responded:

## 2013-11-21 NOTE — Progress Notes (Signed)
Cardizem restarted per order of Rhonda Barrett PA-C. Pt cont to have atial tachy rate 130's. Received po Metoprolol at 9am with no chg in rhythm.  Also given 2.5mg  of IV Metoprolol.

## 2013-11-21 NOTE — Progress Notes (Signed)
Pt converted to SR with HR in mid 90's around 1433pm.  Rosaria Ferries PAC notified. Will continue on Cardizem drip at current rate of 10mg /hr.

## 2013-11-21 NOTE — Progress Notes (Signed)
PULMONARY / CRITICAL CARE MEDICINE   Name: [name] MRN: [id number] DOB: 1950-12-01    ADMISSION DATE:  11/18/2013 CONSULTATION DATE: 8/4  REFERRING MD : [facility]  CHIEF COMPLAINT:  Poor PO intake  INITIAL PRESENTATION: Dehydration/shock  STUDIES:  8/4 procal>>.19 8/4 trop i>><.30 CT chest 8/3 >> nodular infiltrates bilaterally, R>L; ? Infectious vs other  SIGNIFICANT EVENTS: 8/4 tx to icu shock, metabolic disarray   HISTORY OF PRESENT ILLNESS:   She was in her usual state of poor health till 4 days ago(7/31) when she developed nausea(no vomiting) decreased PO intake. These symptoms were exacerbated by recent Neupogen inject for chronic anemia. She presented to [facility] 8/3 , hypotensive, poorly compensated metabolic acidosis (ph 5.3,GUY4 18, bicarb 6.8) along with renal failure base creatine .85, 2.23 on admit. She has no IV assess and her CT of chest revealed multiple pulmonary nodules. PCCM asked to consult, place cvl and assist in managing her care.   SUBJECTIVE:  A Fib + RVR overnight Bicarb gtt stopped, K replaced 8/6 am  VITAL SIGNS: Temp:  [99.1 F (37.3 C)-100.2 F (37.9 C)] 99.7 F (37.6 C) (08/06 0600) Pulse Rate:  [100-137] 124 (08/06 0400) Resp:  [11-24] 16 (08/06 0600) BP: (113-161)/(63-91) 160/91 mmHg (08/06 0600) SpO2:  [92 %-100 %] 100 % (08/06 0828) HEMODYNAMICS: CVP:  [7 mmHg-11 mmHg] 7 mmHg VENTILATOR SETTINGS:   INTAKE / OUTPUT:  Intake/Output Summary (Last 24 hours) at 11/21/13 0843 Last data filed at 11/21/13 0600  Gross per 24 hour  Intake 3329.58 ml  Output   1715 ml  Net 1614.58 ml    PHYSICAL EXAMINATION: General:  Frail, ill appearing , wasted female, more alert Neuro:  Alert and follows commands HEENT: No JVD/LAN ,  HSr RRR Lungs:  diminished in bases, + rhonchi Abdomen:  Dressing intact, ileostomy with drainage  Musculoskeletal:  Wasted muscles Skin:  warm  LABS:  CBC  Recent Labs Lab 11/19/13 0615 11/20/13 0344  11/21/13 0530  WBC 14.9* 9.7 7.6  HGB 8.4* 6.7* 8.4*  HCT 25.1* 19.6* 24.3*  PLT 222 188 154   Coag's No results found for this basename: APTT, INR,  in the last 168 hours BMET  Recent Labs Lab 11/19/13 2100 11/20/13 0344 11/21/13 0530  NA 134* 134* 135*  K 3.4* 3.7 2.9*  CL 102 100 95*  CO2 14* 18* 31  BUN 34* 27* 13  CREATININE 1.29* 1.13* 0.77  GLUCOSE 173* 165* 136*   Electrolytes  Recent Labs Lab 11/19/13 1000 11/19/13 2100 11/20/13 0344 11/21/13 0530  CALCIUM 5.3* 5.4* 5.9* 5.9*  MG 0.7*  --  1.5  --   PHOS 4.2  --   --   --    Sepsis Markers  Recent Labs Lab 11/19/13 0615 11/19/13 1000 11/19/13 1015 11/20/13 0344 11/21/13 0530  LATICACIDVEN 0.8  --  0.8  --   --   PROCALCITON  --  0.22  --  0.19 0.13   ABG  Recent Labs Lab 11/19/13 0335  PHART 7.205*  PCO2ART 17.7*  PO2ART 124.0*   Liver Enzymes  Recent Labs Lab 11/18/13 1945 11/19/13 1000 11/20/13 0344  AST 13 12 16   ALT 9 8 9   ALKPHOS 708* 588* 571*  BILITOT 0.4 0.4 0.4  ALBUMIN 3.3* 2.7* 2.4*   Cardiac Enzymes  Recent Labs Lab 11/19/13 1605 11/19/13 2200 11/20/13 1500  TROPONINI <0.30 <0.30 <0.30   Glucose  Recent Labs Lab 11/20/13 0838  GLUCAP 175*    Imaging Dg Chest  Port 1 View  11/20/2013   CLINICAL DATA:  Pneumonia.  EXAM: PORTABLE CHEST - 1 VIEW  COMPARISON:  11/19/2013  FINDINGS: No cardiomegaly. Unchanged upper mediastinal contours, including aortic tortuosity.  Left IJ central line, tip at the SVC origin.  Nodular airspace disease in the lower lungs is similar to previous. There may have been slight progression at the left base, although not convincing. No effusion. No pneumothorax.  IMPRESSION: Bibasilar pneumonia without significant change since yesterday.   Electronically Signed   By: Jorje Guild M.D.   On: 11/20/2013 06:17     ASSESSMENT / PLAN:  PULMONARY OETT>>none  A: Hypoxia Pulmonary nodules, suspect HCAP but consider other causes COPD  with continued tobacco use P:   O2, BD's Would treat as suspected HCAP, plan to follow repeat chest imaging, probably repeat CT in 4-6 weeks to look for clearance. If nodularity persists then favor FOB or biopsies. If she decompensates then will perform FOB sooner for cx data. Will set her up for pulm f/u w a CT scan > F/u w Aasir Daigler 9/10 at 10:15am. Please order a CT scan chest without contrast to be done just before that appointment. Thank you Smoking cessation  CARDIOVASCULAR L IJ CVC 8/4>> A: Shock (resolved 8/5) Presumed dehydration but consider sepsis - Not on home steroids A Fib + RVR P:  Agree with restart rate control and BP regimen, appreciate cardiology assistance Empiric abx as below  RENAL A:  Acute renal failure, likely ATN from hypovolemia, improved Mixed AG and non-AG metabolic acidosis (delta ratio 0.4 on 8/4). Suspect due to high-output ileostomy and GI losses + acute renal failure. Improved P:   Hydration Avoid nephrotoxins Bicarb drip finished 8/6 am > likely need to start PO bicarb suppliments due to her chronic losses via ileostomy  GASTROINTESTINAL A:   Short gut syndrome Hx Ischemic bowel with ileostomy Open abd wound  P:   CCS consult if needed  HEMATOLOGIC A:   CMML Anemia  P:  Consider Hem/onc consult  INFECTIOUS A:   Possible UTI Probable HCAP, R>L P:   BCx2 8/3>> UC 8/3>> Abx: azactam, start date8/3, day 4/x Abx: levaquin, start date8/3, day 4/x   ENDOCRINE A:  No acute issue P:     NEUROLOGIC A:  No focal defects Mild confusion from illness state P:   RASS goal: 0   TODAY'S SUMMARY:  Improved 8/6. Metabolic status stabilize. Dealing with A Fib. Need to treat suspected HCAP and then follow in [place] clinic to review repeat Ct scan   PCCM will sign off. Please call if we can help you   Baltazar Apo, MD, PhD 11/21/2013, 8:43 AM [facility] [phone] or if no answer [phone]

## 2013-11-21 NOTE — Consult Note (Addendum)
[name] for Infectious Disease    Date of Admission:  11/18/2013  Date of Consult:  11/21/2013  Reason for Consult: MRSA in the urine, pulmonary nodules Referring Physician: Dr. Doyle Askew   HPI: [name] is an 63 y.o. 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 She explained that due to her nausea she is unable to eat or drink much and feels dehydrated .   On admission she had clear SIRS, concerning for septic physiology hypovolemia, ARF. She was found on CXR and CT to have lung nodule seen in upper and lower lobes bilaterally, right  greater than left.  She was admitted to ICU volume repleted, placed on aztreoneam and levaquin. Blood cultures were obtained on admission along with urine cx.UA with pyuria. Blood cultures so far NGTD. Urine has grown MRSA >100K. We were consulted to work up her MRSA in the urine and pulmonary nodules.   Past Medical History  Diagnosis Date  . Hypertension     She has a past hx of essential  . 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 Inflammation of joint of knee     Since we last last saw her she developed problems with an acute which required surgical drainage by her orthopedist Dr. Durward Fortes.  Marland Kitchen MRSA (methicillin resistant Staphylococcus aureus)     Knee surgery drainage was positive for MRSA and she was treated with 3 weeks of doxycycline successfully.  . Diarrhea     Mild  . Exogenous obesity   . GERD (gastroesophageal reflux disease)     2 hosp.- ischemic colitis - residual of Norovirus, 05/2011- sm. bowel obstruction  . Headache(784.0)     migraine headache on occas, less now than when she was younger   . Arthritis     L hip, back, neck   . History of  blood transfusion sept 2013    04/2011- /w ischemic colitis , trouble with matching blood  sept 2013  . Anemia     will see hematology consult prior to surgery, recommended by Dr. Mare Ferrari  . Anemia 12/15/2010  . Ischemic colitis 01/31/2012  . Atrial flutter     during hospitalization, 04/2011- related to anemia & illness/stress   . Pneumonia     04/2011- not hospitalized , pt. denies SOB, changes in chest, breathing  . 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 Dizziness     occasional  . B12 deficiency 02/27/2013  . 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 COPD (chronic obstructive pulmonary disease) 09/26/2013    Past Surgical History  Procedure Laterality Date  . Knee surgery      I&D- 2008, post laceration   . Colonoscopy  05/16/2011    Procedure: COLONOSCOPY;  Surgeon: Winfield Cunas., MD;  Location: [facility];  Service: Endoscopy;  Laterality: N/A;  . Small intestine surgery  1992, 1999  . Laparotomy and lysis of adhesions    . Total hip arthroplasty  10/25/2011    Procedure: TOTAL HIP ARTHROPLASTY;  Surgeon: Garald Balding, MD;  Location: [facility];  Service: Orthopedics;  Laterality: Left;  . Appendectomy  1962  . Abdominal hysterectomy  1988  . Partial colectomy and colostomy  sept 2013    mucous fistula done  . Partial colectomy  05/17/2012    Procedure: PARTIAL COLECTOMY;  Surgeon: Odis Hollingshead, MD;  Location: [facility];  Service: General;  Laterality: N/A;  . Colostomy closure  05/17/2012    Procedure: COLOSTOMY CLOSURE;  Surgeon: Odis Hollingshead, MD;  Location: [facility];  Service: General;  Laterality: N/A;  . Laparotomy  05/17/2012    Procedure: EXPLORATORY LAPAROTOMY;  Surgeon: Odis Hollingshead, MD;  Location: [facility];  Service: General;;  . Lysis of adhesion  05/17/2012    Procedure: LYSIS OF ADHESION;  Surgeon: Odis Hollingshead, MD;  Location: [facility];  Service: General;;  . Esophageal biopsy  05/17/2012    Procedure: BIOPSY;  Surgeon: Odis Hollingshead, MD;  Location:  [facility];  Service: General;;  omental biopsy  . Laparotomy  05/22/2012    Procedure: EXPLORATORY LAPAROTOMY;  Surgeon: Odis Hollingshead, MD;  Location: [facility];  Service: General;  Laterality: N/A;  DRAINAGE  INTRA-ABDOMINAL ABSCESS/LYSIS OF ADHESIONSFOR SMALL BOWEL OBSTRUCTION/DIVERTING LOOP ILEOSTOMY  ergies:   Allergies  Allergen Reactions  . Vancomycin Hives and Rash    wheezing  . Ativan [Lorazepam] Other (See Comments)    "makes me crazy"  . Codeine Nausea And Vomiting  . Tetanus Toxoids Other (See Comments)    Serum reaction  . Penicillins Hives and Rash  . Xarelto [Rivaroxaban] Hives and Rash    (May not be allergic. Vancomycin was also being taken when reaction occurred.)     Medications: I have reviewed patients current medications as documented in Epic Anti-infectives   Start     Dose/Rate Route Frequency Ordered Stop   11/21/13 1000  linezolid (ZYVOX) IVPB 600 mg     600 mg 300 mL/hr over 60 Minutes Intravenous Every 12 hours 11/21/13 0902     11/20/13 2200  levofloxacin (LEVAQUIN) IVPB 750 mg  Status:  Discontinued     750 mg 100 mL/hr over 90 Minutes Intravenous Every 48 hours 11/19/13 0425 11/21/13 0901   11/19/13 1400  aztreonam (AZACTAM) 1 g in dextrose 5 % 50 mL IVPB     1 g 100 mL/hr over 30 Minutes Intravenous Every 8 hours 11/19/13 0423     11/19/13 0430  aztreonam (AZACTAM) 2 g in dextrose 5 % 50 mL IVPB  Status:  Discontinued     2 g 100 mL/hr over 30 Minutes Intravenous  Once 11/19/13 0423 11/20/13 1210   11/19/13 0015  levofloxacin (LEVAQUIN) IVPB 750 mg     750 mg 100 mL/hr over 90 Minutes Intravenous  Once 11/19/13 0000 11/19/13 0200   11/19/13 0015  aztreonam (AZACTAM) 2 g in dextrose 5 % 50 mL IVPB  Status:  Discontinued     2 g 100 mL/hr over 30 Minutes Intravenous  Once 11/19/13 0000 11/19/13 0132      Social History:  reports that she quit smoking about 8 months ago. Her smoking use included Cigarettes. She has a 5 pack-year smoking history.  She has never used smokeless tobacco. She reports that she does not drink alcohol or use illicit drugs.  Family History  Problem Relation Age of Onset  . Stroke Father   . Atrial fibrillation Father   . Hypertension Mother     As in HPI and primary teams notes otherwise 11 Sanchez review of systems is negative  Blood pressure 146/90, pulse 137, temperature 99.5 F (37.5 C), temperature source Core (Comment), resp. rate 16, height 5' 5"  (  1.651 m), weight 123 lb 14.4 oz (56.2 kg), SpO2 100.00%. General: Alert and awake, oriented x3, not in any acute distress. HEENT:EOMI, oropharynx clear and without exudate CVS regular rate, normal r,  no murmur rubs or gallops Chest: clear to auscultation bilaterally, no wheezing, rales or rhonchi Abdomen: soft nontender, nondistended, normal bowel sounds, Extremities: no  clubbing or edema noted bilaterally Skin: no rashes Neuro: nonfocal, strength and sensation intact   Results for orders placed during the hospital encounter of 11/18/13 (from the past 48 hour(s))  BASIC METABOLIC PANEL     Status: Abnormal   Collection Time    11/19/13  9:00 PM      Result Value Ref Range   Sodium 134 (*) 137 - 147 mEq/L   Potassium 3.4 (*) 3.7 - 5.3 mEq/L   Comment: DELTA CHECK NOTED     NO VISIBLE HEMOLYSIS   Chloride 102  96 - 112 mEq/L   CO2 14 (*) 19 - 32 mEq/L   Glucose, Bld 173 (*) 70 - 99 mg/dL   BUN 34 (*) 6 - 23 mg/dL   Creatinine, Ser 1.29 (*) 0.50 - 1.10 mg/dL   Calcium 5.4 (*) 8.4 - 10.5 mg/dL   Comment: CRITICAL RESULT CALLED TO, READ BACK BY AND VERIFIED WITH:     Lauretta Grill RN 2152 11/19/13 A NAVARRO   GFR calc non Af Amer 43 (*) >90 mL/min   GFR calc Af Amer 50 (*) >90 mL/min   Comment: (NOTE)     The eGFR has been calculated using the CKD EPI equation.     This calculation has not been validated in all clinical situations.     eGFR's persistently <90 mL/min signify possible Chronic Kidney     Disease.   Anion gap 18 (*) 5 - 15  TROPONIN  I     Status: None   Collection Time    11/19/13 10:00 PM      Result Value Ref Range   Troponin I <0.30  <0.30 ng/mL   Comment:            Due to the release kinetics of cTnI,     a negative result within the first hours     of the onset of symptoms does not rule out     myocardial infarction with certainty.     If myocardial infarction is still suspected,     repeat the test at appropriate intervals.  PROCALCITONIN     Status: None   Collection Time    11/20/13  3:44 AM      Result Value Ref Range   Procalcitonin 0.19     Comment:            Interpretation:     PCT (Procalcitonin) <= 0.5 ng/mL:     Systemic infection (sepsis) is not likely.     Local bacterial infection is possible.     (NOTE)             ICU PCT Algorithm               Non ICU PCT Algorithm        ----------------------------     ------------------------------             PCT < 0.25 ng/mL                 PCT < 0.1 ng/mL         Stopping of antibiotics  Stopping of antibiotics           strongly encouraged.               strongly encouraged.        ----------------------------     ------------------------------           PCT level decrease by               PCT < 0.25 ng/mL           >= 80% from peak PCT           OR PCT 0.25 - 0.5 ng/mL          Stopping of antibiotics                                                 encouraged.         Stopping of antibiotics               encouraged.        ----------------------------     ------------------------------           PCT level decrease by              PCT >= 0.25 ng/mL           < 80% from peak PCT            AND PCT >= 0.5 ng/mL            Continuing antibiotics                                                  encouraged.           Continuing antibiotics                encouraged.        ----------------------------     ------------------------------         PCT level increase compared          PCT > 0.5 ng/mL             with peak PCT AND               PCT >= 0.5 ng/mL             Escalation of antibiotics                                              strongly encouraged.          Escalation of antibiotics            strongly encouraged.  COMPREHENSIVE METABOLIC PANEL     Status: Abnormal   Collection Time    11/20/13  3:44 AM      Result Value Ref Range   Sodium 134 (*) 137 - 147 mEq/L   Potassium 3.7  3.7 - 5.3 mEq/L   Chloride 100  96 - 112 mEq/L   CO2 18 (*) 19 - 32 mEq/L   Glucose, Bld 165 (*) 70 - 99 mg/dL   BUN 27 (*)  6 - 23 mg/dL   Creatinine, Ser 1.13 (*) 0.50 - 1.10 mg/dL   Calcium 5.9 (*) 8.4 - 10.5 mg/dL   Comment: CRITICAL RESULT CALLED TO, READ BACK BY AND VERIFIED WITH:     COBBS,D RN AT 0423 08.05.15 BY TIBBITTS,K   Total Protein 5.8 (*) 6.0 - 8.3 g/dL   Albumin 2.4 (*) 3.5 - 5.2 g/dL   AST 16  0 - 37 U/L   ALT 9  0 - 35 U/L   Alkaline Phosphatase 571 (*) 39 - 117 U/L   Total Bilirubin 0.4  0.3 - 1.2 mg/dL   GFR calc non Af Amer 50 (*) >90 mL/min   GFR calc Af Amer 58 (*) >90 mL/min   Comment: (NOTE)     The eGFR has been calculated using the CKD EPI equation.     This calculation has not been validated in all clinical situations.     eGFR's persistently <90 mL/min signify possible Chronic Kidney     Disease.   Anion gap 16 (*) 5 - 15  CBC WITH DIFFERENTIAL     Status: Abnormal   Collection Time    11/20/13  3:44 AM      Result Value Ref Range   WBC 9.7  4.0 - 10.5 K/uL   RBC 2.14 (*) 3.87 - 5.11 MIL/uL   Hemoglobin 6.7 (*) 12.0 - 15.0 g/dL   Comment: DELTA CHECK NOTED     REPEATED TO VERIFY     CRITICAL RESULT CALLED TO, READ BACK BY AND VERIFIED WITH:     SPOKE WITH BARHAM,M RN [id number] COVINGTON,N   HCT 19.6 (*) 36.0 - 46.0 %   MCV 91.6  78.0 - 100.0 fL   MCH 31.3  26.0 - 34.0 pg   MCHC 34.2  30.0 - 36.0 g/dL   RDW 19.1 (*) 11.5 - 15.5 %   Platelets 188  150 - 400 K/uL   Neutrophils Relative % 50  43 - 77 %   Lymphocytes Relative 18  12 - 46 %   Monocytes Relative 32 (*) 3 - 12 %    Eosinophils Relative 0  0 - 5 %   Basophils Relative 0  0 - 1 %   Neutro Abs 4.9  1.7 - 7.7 K/uL   Lymphs Abs 1.7  0.7 - 4.0 K/uL   Monocytes Absolute 3.1 (*) 0.1 - 1.0 K/uL   Eosinophils Absolute 0.0  0.0 - 0.7 K/uL   Basophils Absolute 0.0  0.0 - 0.1 K/uL   RBC Morphology ELLIPTOCYTES     WBC Morphology       Value: MODERATE LEFT SHIFT (>5% METAS AND MYELOS,OCC PRO NOTED)  MAGNESIUM     Status: None   Collection Time    11/20/13  3:44 AM      Result Value Ref Range   Magnesium 1.5  1.5 - 2.5 mg/dL  PREPARE RBC (CROSSMATCH)     Status: None   Collection Time    11/20/13  4:45 AM      Result Value Ref Range   Order Confirmation ORDER PROCESSED BY BLOOD BANK    TYPE AND SCREEN     Status: None   Collection Time    11/20/13  4:45 AM      Result Value Ref Range   ABO/RH(D) A POS     Antibody Screen NEG     Sample Expiration 11/23/2013     Unit Number [id number]     Blood  Component Type RBC, LR IRR     Unit division 00     Status of Unit ISSUED,FINAL     Donor AG Type       Value: NEGATIVE FOR DUFFY A ANTIGEN NEGATIVE FOR E ANTIGEN   Transfusion Status OK TO TRANSFUSE     Crossmatch Result COMPATIBLE    GLUCOSE, CAPILLARY     Status: Abnormal   Collection Time    11/20/13  8:38 AM      Result Value Ref Range   Glucose-Capillary 175 (*) 70 - 99 mg/dL  TROPONIN I     Status: None   Collection Time    11/20/13  3:00 PM      Result Value Ref Range   Troponin I <0.30  <0.30 ng/mL   Comment:            Due to the release kinetics of cTnI,     a negative result within the first hours     of the onset of symptoms does not rule out     myocardial infarction with certainty.     If myocardial infarction is still suspected,     repeat the test at appropriate intervals.  PROCALCITONIN     Status: None   Collection Time    11/21/13  5:30 AM      Result Value Ref Range   Procalcitonin 0.13     Comment:            Interpretation:     PCT (Procalcitonin) <= 0.5 ng/mL:      Systemic infection (sepsis) is not likely.     Local bacterial infection is possible.     (NOTE)             ICU PCT Algorithm               Non ICU PCT Algorithm        ----------------------------     ------------------------------             PCT < 0.25 ng/mL                 PCT < 0.1 ng/mL         Stopping of antibiotics            Stopping of antibiotics           strongly encouraged.               strongly encouraged.        ----------------------------     ------------------------------           PCT level decrease by               PCT < 0.25 ng/mL           >= 80% from peak PCT           OR PCT 0.25 - 0.5 ng/mL          Stopping of antibiotics                                                 encouraged.         Stopping of antibiotics               encouraged.        ----------------------------     ------------------------------  PCT level decrease by              PCT >= 0.25 ng/mL           < 80% from peak PCT            AND PCT >= 0.5 ng/mL            Continuing antibiotics                                                  encouraged.           Continuing antibiotics                encouraged.        ----------------------------     ------------------------------         PCT level increase compared          PCT > 0.5 ng/mL             with peak PCT AND              PCT >= 0.5 ng/mL             Escalation of antibiotics                                              strongly encouraged.          Escalation of antibiotics            strongly encouraged.  CBC WITH DIFFERENTIAL     Status: Abnormal   Collection Time    11/21/13  5:30 AM      Result Value Ref Range   WBC 7.6  4.0 - 10.5 K/uL   RBC 2.65 (*) 3.87 - 5.11 MIL/uL   Hemoglobin 8.4 (*) 12.0 - 15.0 g/dL   Comment: DELTA CHECK NOTED     REPEATED TO VERIFY     POST TRANSFUSION SPECIMEN   HCT 24.3 (*) 36.0 - 46.0 %   MCV 91.7  78.0 - 100.0 fL   MCH 31.7  26.0 - 34.0 pg   MCHC 34.6  30.0 - 36.0 g/dL   RDW  19.3 (*) 11.5 - 15.5 %   Platelets 154  150 - 400 K/uL   Neutrophils Relative % 43  43 - 77 %   Neutro Abs 3.2  1.7 - 7.7 K/uL   Lymphocytes Relative 28  12 - 46 %   Lymphs Abs 2.1  0.7 - 4.0 K/uL   Monocytes Relative 29 (*) 3 - 12 %   Monocytes Absolute 2.2 (*) 0.1 - 1.0 K/uL   Eosinophils Relative 0  0 - 5 %   Eosinophils Absolute 0.0  0.0 - 0.7 K/uL   Basophils Relative 0  0 - 1 %   Basophils Absolute 0.0  0.0 - 0.1 K/uL  BASIC METABOLIC PANEL     Status: Abnormal   Collection Time    11/21/13  5:30 AM      Result Value Ref Range   Sodium 135 (*) 137 - 147 mEq/L   Potassium 2.9 (*) 3.7 - 5.3 mEq/L   Comment: DELTA CHECK NOTED     REPEATED TO VERIFY     CRITICAL  RESULT CALLED TO, READ BACK BY AND VERIFIED WITH:     BARHAM,M/2W @0621  ON 11/21/13 BY KARCZEWSKI,S.   Chloride 95 (*) 96 - 112 mEq/L   CO2 31  19 - 32 mEq/L   Glucose, Bld 136 (*) 70 - 99 mg/dL   BUN 13  6 - 23 mg/dL   Comment: DELTA CHECK NOTED     REPEATED TO VERIFY   Creatinine, Ser 0.77  0.50 - 1.10 mg/dL   Calcium 5.9 (*) 8.4 - 10.5 mg/dL   Comment: CRITICAL RESULT CALLED TO, READ BACK BY AND VERIFIED WITH:     BARHAM,M/2W @0621  ON 11/21/13 BY KARCZEWSKI,S.   GFR calc non Af Amer 87 (*) >90 mL/min   GFR calc Af Amer >90  >90 mL/min   Comment: (NOTE)     The eGFR has been calculated using the CKD EPI equation.     This calculation has not been validated in all clinical situations.     eGFR's persistently <90 mL/min signify possible Chronic Kidney     Disease.   Anion gap 9  5 - 15   @BRIEFLABTABLE (sdes,specrequest,cult,reptstatus)   ) Recent Results (from the past 720 hour(s))  TECHNOLOGIST REVIEW     Status: None   Collection Time    11/05/13 11:39 AM      Result Value Ref Range Status   Technologist Review Rare NRBC and Rare Myelo   Final  CULTURE, BLOOD (ROUTINE X 2)     Status: None   Collection Time    11/18/13 11:25 PM      Result Value Ref Range Status   Specimen Description BLOOD L ARM    Final   Special Requests BOTTLES DRAWN AEROBIC AND ANAEROBIC 5CC   Final   Culture  Setup Time     Final   Value: 11/19/2013 03:53     Performed at [facility]   Culture     Final   Value:        BLOOD CULTURE RECEIVED NO GROWTH TO DATE CULTURE WILL BE HELD FOR 5 DAYS BEFORE ISSUING A FINAL NEGATIVE REPORT     Performed at [facility]   Report Status PENDING   Incomplete  CULTURE, BLOOD (ROUTINE X 2)     Status: None   Collection Time    11/18/13 11:30 PM      Result Value Ref Range Status   Specimen Description BLOOD LEFT ANTECUBITAL   Final   Special Requests BOTTLES DRAWN AEROBIC AND ANAEROBIC 5CC   Final   Culture  Setup Time     Final   Value: 11/19/2013 03:54     Performed at [facility]   Culture     Final   Value:        BLOOD CULTURE RECEIVED NO GROWTH TO DATE CULTURE WILL BE HELD FOR 5 DAYS BEFORE ISSUING A FINAL NEGATIVE REPORT     Performed at [facility]   Report Status PENDING   Incomplete  MRSA PCR SCREENING     Status: Abnormal   Collection Time    11/19/13  2:45 AM      Result Value Ref Range Status   MRSA by PCR POSITIVE (*) NEGATIVE Final   Comment:            The GeneXpert MRSA Assay (FDA     approved for NASAL specimens     only), is one component of a     comprehensive MRSA colonization  surveillance program. It is not     intended to diagnose MRSA     infection nor to guide or     monitor treatment for     MRSA infections.     RESULT CALLED TO, READ BACK BY AND VERIFIED WITH:     JOHNSON RN AT 0510 ON 08.04.15 BY SHUEA  URINE CULTURE     Status: None   Collection Time    11/19/13  8:49 AM      Result Value Ref Range Status   Specimen Description URINE, CATHETERIZED   Final   Special Requests NONE   Final   Culture  Setup Time     Final   Value: 11/19/2013 11:10     Performed at [facility]     Final   Value: >=100,000 COLONIES/ML     Performed at [facility]   Culture      Final   Value: METHICILLIN RESISTANT STAPHYLOCOCCUS AUREUS     Note: RIFAMPIN AND GENTAMICIN SHOULD NOT BE USED AS SINGLE DRUGS FOR TREATMENT OF STAPH INFECTIONS. CRITICAL RESULT CALLED TO, READ BACK BY AND VERIFIED WITH: STEPHANIE DILLON @ 8:31AM 11/21/13 BY DWEEKS     Performed at [facility]   Report Status 11/21/2013 FINAL   Final   Organism ID, Bacteria METHICILLIN RESISTANT STAPHYLOCOCCUS AUREUS   Final     Impression/Recommendation  Principal Problem:   Acute respiratory distress Active Problems:   Dehydration   Hypotension   CMML (chronic myelomonocytic leukemia)   Open abdominal wall wound   Ileostomy status   Hypocalcemia   Anemia of other chronic disease   Metabolic acidosis   COPD (chronic obstructive pulmonary disease)   Anemia of chronic disease   UTI (lower urinary tract infection)   Weakness   UTI (urinary tract infection)   Severe sepsis   ARF (acute renal failure)   Urinary retention   CAP (community acquired pneumonia)   Leukocytosis, unspecified   Protein-calorie malnutrition, severe   [name] is a 63 y.o. female with  CML receiving chemotherapy sp epogen admitted with SIRS, hypovolemia, found to have multiple pulmonary nodules, being rx for CAP (sans MRSA coverage) now with MRSA in urine cx but blood cultures negative  #1 MRSA in the urine: ID dictum is that MRSA in the urine is sign of overt or occult MRSA bacteremia that seeded the urine, outside of a few scenarios including those with indwelling catheters, improper collection of urine.  --in this highly compromised patient and with pulnonary nodules I worry that she could have been having occult bacteremias and could even have right sided endocarditis with lung nodules representing possible septic emboli.   2 D echo without vegetations  --repeat BLOOD cultures --start zyvox 660m q 12 hours --watch her platelets --consider TEE  #2 Lung nodules: Agree with continued antibiotics but  would narrow to zyvox and aztreonam for now, dc FQ  #3 Screening: HIV negative and check hep panel   11/21/2013, 5:03 PM   Thank you so much for this interesting consult  Kari [phone](pager) [phone](office) 11/21/2013, 5:03 PM  [name] 11/21/2013, 5:03 PM

## 2013-11-21 NOTE — Progress Notes (Signed)
Patient ID: [name], female   DOB: 19-Jul-1950, 63 y.o.   MRN: [id number]  [facility] PROGRESS NOTE  Addilee Grilli KYH:[id number] DOB: 01-Dec-1950 DOA: 11/18/2013 PCP: Darlin Coco, MD  Brief narrative:  Pt is 63 yo female with HTN CMML, with ileostomy , COPD, anemia of chronic disease, presented to [facility] ED with several days duration of progressively worsening nausea with no vomiting, poor oral intake. In ED, pt noted to be hypotensive with poor compensated metabolic acidosis (ph 1.5,VVO1 18, bicarb 6.8) along with renal failure Cr 2.23 (baseline Cr <1), CT of chest revealed multiple pulmonary nodules. PCCM asked to consult, place cvl and assist in managing her care.   Significant events since admission:  8/4 --> transferred to ICU, placed L IJ CVC  8/6 --> MRSA + urine culture   Principal Problem:  Acute hypoxic respiratory distress  - with pulmonary nodules c/w PNA, suspect HCAP but underlying malignancy not excluded so needs close follow up to ensure resolution  - pt also with COPD and ongoing tobacco abuse  - pt is clinically improving but still requiring oxygen via [place], WBC trending down and WNL this AM, Tmax 99.7 F over the past 24 hours  - continue to provide supportive care with BD's, oxygen via [place]  - needs repeat CT in 4-6 weeks to ensure clearance  - if nodularity persists, will need additional evaluation, with FOB  Active Problems:  Septic shock, from HCAP, ? UTI  - resolved, lactic acid is WNL, vital signs stable  - pt completed ABX Aztreonam and Levaquin day #4 - urine culture positive for MRSA - change ABX to Zyvox, stop Levaquin - appreciate ID input  UTI - MRSA isolated in urine - ABX changed to Zyvox  ARF (acute renal failure)  - likely from hypovolemia and developing ATN  - Cr is improving and trending down, WNL this AM - repeat BMP in AM  Mixed AG and non-AG metabolic acidosis (delta ratio 0.4 on 8/4)  - Suspect due to high-output ileostomy and GI losses  + acute renal failure  - improving - continue IVF, bicarb drip  Hypocalcemia  - still low, continue to supplement and repeat BMP in AM  Hypokalemia with hypomagnesemia  - supplement K via IV route  Open abdominal wall wound  - with iliostomy in place and with drainage  Anemia of other chronic disease  - 10 --> 8.4 --> 6.7  - transfused 2 U PRBC August 5th, 2015 with appropriate increase in hg post transfusion  - hemoccult stool pending  COPD (chronic obstructive pulmonary disease)  - continue BD's scheduled and as needed  - continue to provide nicotine patch  Protein-calorie malnutrition, severe  - advance diet as pt able to tolerate   Consultants:  PCCM ID Procedures/Studies:  CXR 11/18/2013 1.5 x 1.5 cm nodular opacity right lower lobe. No edema or consolidation.  CXR 11/20/2013 Bibasilar pneumonia without significant change since yesterday.  CXR 11/19/2013 Nodular infiltrates, c/w PNA.  CT chest 11/18/2013 Numerous upper and lower lobe nodular densities bilaterally, R>L, ? diffuse pneumonitis vsmetastatic  Antibiotics:  Aztreonam 8/3 -->  Levaquin 8/3 --> 8/6 Zyvox 8/6 -->  Code Status: Full  Family Communication: Pt at bedside  Disposition Plan: Remains inpatient   HPI/Subjective: No events overnight.   Objective: Filed Vitals:   11/21/13 0400 11/21/13 0500 11/21/13 0600 11/21/13 0828  BP: 157/89 149/90 160/91   Pulse: 124     Temp: 99.7 F (37.6 C) 99.7 F (37.6 C) 99.7  F (37.6 C)   TempSrc:      Resp: 24 15 16    Height:      Weight:      SpO2:    100%    Intake/Output Summary (Last 24 hours) at 11/21/13 0843 Last data filed at 11/21/13 0600  Gross per 24 hour  Intake 3329.58 ml  Output   1715 ml  Net 1614.58 ml    Exam:   General:  Pt is alert, follows commands appropriately, not in acute distress  Cardiovascular: Irregular rate and rhythm  Respiratory: Bilateral rhonchi  Abdomen: Soft, non tender, non distended, bowel sounds present, no  guarding  Extremities: No edema, pulses DP and PT palpable bilaterally  Neuro: Grossly nonfocal  Data Reviewed: Basic Metabolic Panel:  Recent Labs Lab 11/18/13 1945 11/19/13 0615 11/19/13 1000 11/19/13 2100 11/20/13 0344 11/21/13 0530  NA 131* 132*  --  134* 134* 135*  K 5.6* 5.0  --  3.4* 3.7 2.9*  CL 101 105  --  102 100 95*  CO2 9* 9*  --  14* 18* 31  GLUCOSE 103* 89  --  173* 165* 136*  BUN 51* 46*  --  34* 27* 13  CREATININE 2.23* 1.80*  --  1.29* 1.13* 0.77  CALCIUM 6.8* 5.8* 5.3* 5.4* 5.9* 5.9*  MG  --   --  0.7*  --  1.5  --   PHOS  --   --  4.2  --   --   --    Liver Function Tests:  Recent Labs Lab 11/18/13 1945 11/19/13 1000 11/20/13 0344  AST 13 12 16   ALT 9 8 9   ALKPHOS 708* 588* 571*  BILITOT 0.4 0.4 0.4  PROT 8.0 6.7 5.8*  ALBUMIN 3.3* 2.7* 2.4*   CBC:  Recent Labs Lab 11/18/13 1945 11/19/13 0615 11/20/13 0344 11/21/13 0530  WBC 19.7* 14.9* 9.7 7.6  NEUTROABS 13.0*  --  4.9 3.2  HGB 10.1* 8.4* 6.7* 8.4*  HCT 29.7* 25.1* 19.6* 24.3*  MCV 98.0 96.5 91.6 91.7  PLT 315 222 188 154   Cardiac Enzymes:  Recent Labs Lab 11/19/13 1000 11/19/13 1605 11/19/13 2200 11/20/13 1500  TROPONINI <0.30 <0.30 <0.30 <0.30   CBG:  Recent Labs Lab 11/20/13 0838  GLUCAP 175*    Recent Results (from the past 240 hour(s))  CULTURE, BLOOD (ROUTINE X 2)     Status: None   Collection Time    11/18/13 11:25 PM      Result Value Ref Range Status   Specimen Description BLOOD L ARM   Final   Special Requests BOTTLES DRAWN AEROBIC AND ANAEROBIC 5CC   Final   Culture  Setup Time     Final   Value: 11/19/2013 03:53     Performed at [facility]   Culture     Final   Value:        BLOOD CULTURE RECEIVED NO GROWTH TO DATE CULTURE WILL BE HELD FOR 5 DAYS BEFORE ISSUING A FINAL NEGATIVE REPORT     Performed at [facility]   Report Status PENDING   Incomplete  CULTURE, BLOOD (ROUTINE X 2)     Status: None   Collection Time    11/18/13  11:30 PM      Result Value Ref Range Status   Specimen Description BLOOD LEFT ANTECUBITAL   Final   Special Requests BOTTLES DRAWN AEROBIC AND ANAEROBIC 5CC   Final   Culture  Setup Time  Final   Value: 11/19/2013 03:54     Performed at [facility]   Culture     Final   Value:        BLOOD CULTURE RECEIVED NO GROWTH TO DATE CULTURE WILL BE HELD FOR 5 DAYS BEFORE ISSUING A FINAL NEGATIVE REPORT     Performed at [facility]   Report Status PENDING   Incomplete  MRSA PCR SCREENING     Status: Abnormal   Collection Time    11/19/13  2:45 AM      Result Value Ref Range Status   MRSA by PCR POSITIVE (*) NEGATIVE Final   Comment:            The GeneXpert MRSA Assay (FDA     approved for NASAL specimens     only), is one component of a     comprehensive MRSA colonization     surveillance program. It is not     intended to diagnose MRSA     infection nor to guide or     monitor treatment for     MRSA infections.     RESULT CALLED TO, READ BACK BY AND VERIFIED WITH:     JOHNSON RN AT 0510 ON 08.04.15 BY SHUEA  URINE CULTURE     Status: None   Collection Time    11/19/13  8:49 AM      Result Value Ref Range Status   Specimen Description URINE, CATHETERIZED   Final   Special Requests NONE   Final   Culture  Setup Time     Final   Value: 11/19/2013 11:10     Performed at [place]     Final   Value: >=100,000 COLONIES/ML     Performed at [facility]   Culture     Final   Value: METHICILLIN RESISTANT STAPHYLOCOCCUS AUREUS     Note: RIFAMPIN AND GENTAMICIN SHOULD NOT BE USED AS SINGLE DRUGS FOR TREATMENT OF STAPH INFECTIONS. CRITICAL RESULT CALLED TO, READ BACK BY AND VERIFIED WITH: STEPHANIE DILLON @ 8:31AM 11/21/13 BY DWEEKS     Performed at [facility]   Report Status 11/21/2013 FINAL   Final   Organism ID, Bacteria METHICILLIN RESISTANT STAPHYLOCOCCUS AUREUS   Final     Scheduled Meds: . sodium chloride   Intravenous  Once  . antiseptic oral rinse  7 mL Mouth Rinse BID  . aspirin  325 mg Oral q morning - 10a  . aztreonam  1 g Intravenous Q8H  . buPROPion  300 mg Oral QAC breakfast  . calcium citrate  500 mg of elemental calcium Oral QID  . calcium gluconate  1 g Intravenous Once  . chlorhexidine  15 mL Mouth Rinse BID  . Chlorhexidine Gluconate Cloth  6 each Topical Q0600  . cholecalciferol  1,000 Units Oral Daily  . enoxaparin (LOVENOX) injection  40 mg Subcutaneous Q24H  . estradiol  2 mg Oral QAC breakfast  . feeding supplement (ENSURE COMPLETE)  237 mL Oral TID BM  . ipratropium  0.5 mg Nebulization TID  . levalbuterol  0.63 mg Nebulization TID  . levofloxacin (LEVAQUIN) IV  750 mg Intravenous Q48H  . metoprolol tartrate  25 mg Oral 4 times per day  . mupirocin ointment  1 application Nasal BID  . nicotine  14 mg Transdermal Daily  . pantoprazole (PROTONIX) IV  40 mg Intravenous Daily  . potassium chloride  10 mEq Intravenous  Q1 Hr x 6   Continuous Infusions: . diltiazem (CARDIZEM) infusion Stopped (11/20/13 2300)   Faye Ramsay, MD  [facility] Pager [phone]  If 7PM-7AM, please contact night-coverage [email or web address] Password [email or web address] 11/21/2013, 8:43 AM   LOS: 3 days

## 2013-11-21 NOTE — Progress Notes (Signed)
SUBJECTIVE:  Weak and nauseated.  Uncomfortable but no pain. No dyspnea   PHYSICAL EXAM Filed Vitals:   11/21/13 0300 11/21/13 0400 11/21/13 0500 11/21/13 0600  BP: 145/82 157/89 149/90 160/91  Pulse:  124    Temp: 99.7 F (37.6 C) 99.7 F (37.6 C) 99.7 F (37.6 C) 99.7 F (37.6 C)  TempSrc:      Resp: 16 24 15 16   Height:      Weight:      SpO2:       General:  Chronically ill appearing Lungs:  Decreased breath sounds Heart:  RRR Abdomen:  Positive bowel sounds, no rebound no guarding Extremities:  Mild edema   LABS: Lab Results  Component Value Date   TROPONINI <0.30 11/20/2013   Results for orders placed during the hospital encounter of 11/18/13 (from the past 24 hour(s))  GLUCOSE, CAPILLARY     Status: Abnormal   Collection Time    11/20/13  8:38 AM      Result Value Ref Range   Glucose-Capillary 175 (*) 70 - 99 mg/dL  TROPONIN I     Status: None   Collection Time    11/20/13  3:00 PM      Result Value Ref Range   Troponin I <0.30  <0.30 ng/mL  PROCALCITONIN     Status: None   Collection Time    11/21/13  5:30 AM      Result Value Ref Range   Procalcitonin 0.13    CBC WITH DIFFERENTIAL     Status: Abnormal   Collection Time    11/21/13  5:30 AM      Result Value Ref Range   WBC 7.6  4.0 - 10.5 K/uL   RBC 2.65 (*) 3.87 - 5.11 MIL/uL   Hemoglobin 8.4 (*) 12.0 - 15.0 g/dL   HCT 24.3 (*) 36.0 - 46.0 %   MCV 91.7  78.0 - 100.0 fL   MCH 31.7  26.0 - 34.0 pg   MCHC 34.6  30.0 - 36.0 g/dL   RDW 19.3 (*) 11.5 - 15.5 %   Platelets 154  150 - 400 K/uL   Neutrophils Relative % 43  43 - 77 %   Neutro Abs 3.2  1.7 - 7.7 K/uL   Lymphocytes Relative 28  12 - 46 %   Lymphs Abs 2.1  0.7 - 4.0 K/uL   Monocytes Relative 29 (*) 3 - 12 %   Monocytes Absolute 2.2 (*) 0.1 - 1.0 K/uL   Eosinophils Relative 0  0 - 5 %   Eosinophils Absolute 0.0  0.0 - 0.7 K/uL   Basophils Relative 0  0 - 1 %   Basophils Absolute 0.0  0.0 - 0.1 K/uL  BASIC METABOLIC PANEL     Status:  Abnormal   Collection Time    11/21/13  5:30 AM      Result Value Ref Range   Sodium 135 (*) 137 - 147 mEq/L   Potassium 2.9 (*) 3.7 - 5.3 mEq/L   Chloride 95 (*) 96 - 112 mEq/L   CO2 31  19 - 32 mEq/L   Glucose, Bld 136 (*) 70 - 99 mg/dL   BUN 13  6 - 23 mg/dL   Creatinine, Ser 0.77  0.50 - 1.10 mg/dL   Calcium 5.9 (*) 8.4 - 10.5 mg/dL   GFR calc non Af Amer 87 (*) >90 mL/min   GFR calc Af Amer >90  >90 mL/min   Anion gap  9  5 - 15    Intake/Output Summary (Last 24 hours) at 11/21/13 0752 Last data filed at 11/21/13 0600  Gross per 24 hour  Intake 3454.58 ml  Output   1775 ml  Net 1679.58 ml    ASSESSMENT AND PLAN:  ATRIAL FIB/RVR:   Paroxysmal atrial tachycardia on telemetry.  BP looks like it will tolerate increased medication.  I will change to PO beta blocker.  If still elevated later today we can start Cardizem.   ACUTE RESPIRATORY FAILURE:   She is in no acute distress.  Plan per primary team.    Minus Breeding 11/21/2013 7:52 AM

## 2013-11-22 DIAGNOSIS — R6510 Systemic inflammatory response syndrome (SIRS) of non-infectious origin without acute organ dysfunction: Secondary | ICD-10-CM

## 2013-11-22 DIAGNOSIS — C9210 Chronic myeloid leukemia, BCR/ABL-positive, not having achieved remission: Secondary | ICD-10-CM

## 2013-11-22 DIAGNOSIS — E861 Hypovolemia: Secondary | ICD-10-CM

## 2013-11-22 DIAGNOSIS — R918 Other nonspecific abnormal finding of lung field: Secondary | ICD-10-CM

## 2013-11-22 DIAGNOSIS — R3919 Other difficulties with micturition: Secondary | ICD-10-CM

## 2013-11-22 LAB — HEPATITIS PANEL, ACUTE
HCV AB: NEGATIVE
HEP B S AG: NEGATIVE
Hep A IgM: NONREACTIVE
Hep B C IgM: NONREACTIVE

## 2013-11-22 LAB — CBC
HCT: 25.7 % — ABNORMAL LOW (ref 36.0–46.0)
Hemoglobin: 8.6 g/dL — ABNORMAL LOW (ref 12.0–15.0)
MCH: 31.4 pg (ref 26.0–34.0)
MCHC: 33.5 g/dL (ref 30.0–36.0)
MCV: 93.8 fL (ref 78.0–100.0)
PLATELETS: 185 10*3/uL (ref 150–400)
RBC: 2.74 MIL/uL — ABNORMAL LOW (ref 3.87–5.11)
RDW: 19.4 % — ABNORMAL HIGH (ref 11.5–15.5)
WBC: 9.7 10*3/uL (ref 4.0–10.5)

## 2013-11-22 LAB — BASIC METABOLIC PANEL
Anion gap: 9 (ref 5–15)
BUN: 9 mg/dL (ref 6–23)
CO2: 31 mEq/L (ref 19–32)
CREATININE: 0.76 mg/dL (ref 0.50–1.10)
Calcium: 6.1 mg/dL — CL (ref 8.4–10.5)
Chloride: 95 mEq/L — ABNORMAL LOW (ref 96–112)
GFR, EST NON AFRICAN AMERICAN: 87 mL/min — AB (ref >90)
Glucose, Bld: 118 mg/dL — ABNORMAL HIGH (ref 70–99)
Potassium: 3.5 mEq/L — ABNORMAL LOW (ref 3.7–5.3)
Sodium: 135 mEq/L — ABNORMAL LOW (ref 137–147)

## 2013-11-22 MED ORDER — SODIUM CHLORIDE 0.9 % IJ SOLN
10.0000 mL | INTRAMUSCULAR | Status: DC | PRN
  Administered 2013-11-23 – 2013-11-25 (×5): 10 mL
  Administered 2013-11-26 – 2013-11-27 (×2): 20 mL

## 2013-11-22 MED ORDER — SODIUM CHLORIDE 0.9 % IV SOLN
1.0000 g | Freq: Once | INTRAVENOUS | Status: AC
  Administered 2013-11-22: 1 g via INTRAVENOUS
  Filled 2013-11-22 (×3): qty 10

## 2013-11-22 MED ORDER — POTASSIUM CHLORIDE 10 MEQ/50ML IV SOLN
INTRAVENOUS | Status: AC
  Administered 2013-11-22: 10 meq via INTRAVENOUS
  Filled 2013-11-22: qty 50

## 2013-11-22 MED ORDER — MAGIC MOUTHWASH W/LIDOCAINE
15.0000 mL | Freq: Four times a day (QID) | ORAL | Status: DC | PRN
  Administered 2013-11-22: 15 mL via ORAL
  Filled 2013-11-22: qty 15

## 2013-11-22 MED ORDER — POTASSIUM CHLORIDE 10 MEQ/50ML IV SOLN
10.0000 meq | INTRAVENOUS | Status: AC
  Administered 2013-11-22 (×4): 10 meq via INTRAVENOUS
  Filled 2013-11-22 (×4): qty 50

## 2013-11-22 MED ORDER — ALTEPLASE 2 MG IJ SOLR
2.0000 mg | Freq: Once | INTRAMUSCULAR | Status: AC
Start: 2013-11-22 — End: 2013-11-22
  Administered 2013-11-22: 2 mg
  Filled 2013-11-22: qty 2

## 2013-11-22 MED ORDER — LEVALBUTEROL HCL 1.25 MG/0.5ML IN NEBU
1.2500 mg | INHALATION_SOLUTION | RESPIRATORY_TRACT | Status: DC | PRN
  Filled 2013-11-22: qty 0.5

## 2013-11-22 MED ORDER — SILVER NITRATE-POT NITRATE 75-25 % EX MISC
1.0000 "application " | Freq: Every morning | CUTANEOUS | Status: DC
  Administered 2013-11-23 – 2013-11-27 (×3): 1 via TOPICAL
  Filled 2013-11-22 (×6): qty 1

## 2013-11-22 MED ORDER — DILTIAZEM HCL 30 MG PO TABS
30.0000 mg | ORAL_TABLET | Freq: Four times a day (QID) | ORAL | Status: DC
  Administered 2013-11-22 – 2013-11-24 (×9): 30 mg via ORAL
  Filled 2013-11-22 (×13): qty 1

## 2013-11-22 NOTE — Progress Notes (Signed)
[facility] for Infectious Disease    Subjective: No new complaints   Antibiotics:  Anti-infectives   Start     Dose/Rate Route Frequency Ordered Stop   11/21/13 1000  linezolid (ZYVOX) IVPB 600 mg     600 mg 300 mL/hr over 60 Minutes Intravenous Every 12 hours 11/21/13 0902     11/20/13 2200  levofloxacin (LEVAQUIN) IVPB 750 mg  Status:  Discontinued     750 mg 100 mL/hr over 90 Minutes Intravenous Every 48 hours 11/19/13 0425 11/21/13 0901   11/19/13 1400  aztreonam (AZACTAM) 1 g in dextrose 5 % 50 mL IVPB     1 g 100 mL/hr over 30 Minutes Intravenous Every 8 hours 11/19/13 0423     11/19/13 0430  aztreonam (AZACTAM) 2 g in dextrose 5 % 50 mL IVPB  Status:  Discontinued     2 g 100 mL/hr over 30 Minutes Intravenous  Once 11/19/13 0423 11/20/13 1210   11/19/13 0015  levofloxacin (LEVAQUIN) IVPB 750 mg     750 mg 100 mL/hr over 90 Minutes Intravenous  Once 11/19/13 0000 11/19/13 0200   11/19/13 0015  aztreonam (AZACTAM) 2 g in dextrose 5 % 50 mL IVPB  Status:  Discontinued     2 g 100 mL/hr over 30 Minutes Intravenous  Once 11/19/13 0000 11/19/13 0132      Medications: Scheduled Meds: . sodium chloride   Intravenous Once  . antiseptic oral rinse  7 mL Mouth Rinse BID  . aspirin  325 mg Oral q morning - 10a  . aztreonam  1 g Intravenous Q8H  . buPROPion  300 mg Oral QAC breakfast  . calcium citrate  500 mg of elemental calcium Oral QID  . chlorhexidine  15 mL Mouth Rinse BID  . Chlorhexidine Gluconate Cloth  6 each Topical Q0600  . cholecalciferol  1,000 Units Oral Daily  . diltiazem  30 mg Oral 4 times per day  . enoxaparin (LOVENOX) injection  40 mg Subcutaneous Q24H  . estradiol  2 mg Oral QAC breakfast  . feeding supplement (ENSURE COMPLETE)  237 mL Oral TID BM  . ipratropium  0.5 mg Nebulization TID  . levalbuterol  0.63 mg Nebulization TID  . linezolid  600 mg Intravenous Q12H  . metoprolol tartrate  25 mg Oral 4 times per day  . mupirocin ointment  1  application Nasal BID  . nicotine  14 mg Transdermal Daily  . pantoprazole (PROTONIX) IV  40 mg Intravenous Daily   Continuous Infusions:  PRN Meds:.acetaminophen, HYDROmorphone (DILAUDID) injection, levalbuterol, magic mouthwash w/lidocaine, metoprolol, ondansetron (ZOFRAN) IV, prochlorperazine, promethazine    Objective: Weight change:   Intake/Output Summary (Last 24 hours) at 11/22/13 1545 Last data filed at 11/22/13 1243  Gross per 24 hour  Intake 1472.92 ml  Output   1155 ml  Net 317.92 ml   Blood pressure 146/88, pulse 89, temperature 99.1 F (37.3 C), temperature source Core (Comment), resp. rate 15, height 5\' 5"  (1.651 m), weight 129 lb 10.1 oz (58.8 kg), SpO2 100.00%. Temp:  [98.1 F (36.7 C)-100.2 F (37.9 C)] 99.1 F (37.3 C) (08/07 1500) Pulse Rate:  [83-102] 89 (08/07 1208) Resp:  [8-24] 15 (08/07 1500) BP: (134-166)/(76-88) 146/88 mmHg (08/07 1300) SpO2:  [90 %-100 %] 100 % (08/07 1500) Weight:  [129 lb 10.1 oz (58.8 kg)] 129 lb 10.1 oz (58.8 kg) (08/07 0400)  Physical Exam:  General: Alert and awake, oriented x3, not in any acute distress.  HEENT:EOMI, oropharynx clear and without exudate  CVS regular rate, normal r, no murmur rubs or gallops  Chest: clear to auscultation bilaterally, no wheezing, rales or rhonchi  Abdomen: soft nontender, nondistended, normal bowel sounds,  Extremities: no clubbing or edema noted bilaterally  Skin: no rashes  Neuro: nonfocal, strength and sensation intact  CBC:  Recent Labs Lab 11/18/13 1945 11/19/13 0615 11/20/13 0344 11/21/13 0530 11/22/13 0530  HGB 10.1* 8.4* 6.7* 8.4* 8.6*  HCT 29.7* 25.1* 19.6* 24.3* 25.7*  PLT 315 222 188 154 185     BMET  Recent Labs  11/21/13 0530 11/22/13 0530  NA 135* 135*  K 2.9* 3.5*  CL 95* 95*  CO2 31 31  GLUCOSE 136* 118*  BUN 13 9  CREATININE 0.77 0.76  CALCIUM 5.9* 6.1*     Liver Panel   Recent Labs  11/20/13 0344  PROT 5.8*  ALBUMIN 2.4*  AST 16  ALT  9  ALKPHOS 571*  BILITOT 0.4       Sedimentation Rate No results found for this basename: ESRSEDRATE,  in the last 72 hours C-Reactive Protein No results found for this basename: CRP,  in the last 72 hours  Micro Results: Recent Results (from the past 720 hour(s))  TECHNOLOGIST REVIEW     Status: None   Collection Time    11/05/13 11:39 AM      Result Value Ref Range Status   Technologist Review Rare NRBC and Rare Myelo   Final  CULTURE, BLOOD (ROUTINE X 2)     Status: None   Collection Time    11/18/13 11:25 PM      Result Value Ref Range Status   Specimen Description BLOOD L ARM   Final   Special Requests BOTTLES DRAWN AEROBIC AND ANAEROBIC 5CC   Final   Culture  Setup Time     Final   Value: 11/19/2013 03:53     Performed at [facility]   Culture     Final   Value:        BLOOD CULTURE RECEIVED NO GROWTH TO DATE CULTURE WILL BE HELD FOR 5 DAYS BEFORE ISSUING A FINAL NEGATIVE REPORT     Performed at [facility]   Report Status PENDING   Incomplete  CULTURE, BLOOD (ROUTINE X 2)     Status: None   Collection Time    11/18/13 11:30 PM      Result Value Ref Range Status   Specimen Description BLOOD LEFT ANTECUBITAL   Final   Special Requests BOTTLES DRAWN AEROBIC AND ANAEROBIC 5CC   Final   Culture  Setup Time     Final   Value: 11/19/2013 03:54     Performed at [facility]   Culture     Final   Value:        BLOOD CULTURE RECEIVED NO GROWTH TO DATE CULTURE WILL BE HELD FOR 5 DAYS BEFORE ISSUING A FINAL NEGATIVE REPORT     Performed at [facility]   Report Status PENDING   Incomplete  MRSA PCR SCREENING     Status: Abnormal   Collection Time    11/19/13  2:45 AM      Result Value Ref Range Status   MRSA by PCR POSITIVE (*) NEGATIVE Final   Comment:            The GeneXpert MRSA Assay (FDA     approved for NASAL specimens     only), is one  component of a     comprehensive MRSA colonization     surveillance program. It is not      intended to diagnose MRSA     infection nor to guide or     monitor treatment for     MRSA infections.     RESULT CALLED TO, READ BACK BY AND VERIFIED WITH:     JOHNSON RN AT 0510 ON 08.04.15 BY SHUEA  URINE CULTURE     Status: None   Collection Time    11/19/13  8:49 AM      Result Value Ref Range Status   Specimen Description URINE, CATHETERIZED   Final   Special Requests NONE   Final   Culture  Setup Time     Final   Value: 11/19/2013 11:10     Performed at [facility]     Final   Value: >=100,000 COLONIES/ML     Performed at [facility]   Culture     Final   Value: METHICILLIN RESISTANT STAPHYLOCOCCUS AUREUS     Note: RIFAMPIN AND GENTAMICIN SHOULD NOT BE USED AS SINGLE DRUGS FOR TREATMENT OF STAPH INFECTIONS. CRITICAL RESULT CALLED TO, READ BACK BY AND VERIFIED WITH: STEPHANIE DILLON @ 8:31AM 11/21/13 BY DWEEKS     Performed at [facility]   Report Status 11/21/2013 FINAL   Final   Organism ID, Bacteria METHICILLIN RESISTANT STAPHYLOCOCCUS AUREUS   Final  CULTURE, BLOOD (ROUTINE X 2)     Status: None   Collection Time    11/21/13  9:50 AM      Result Value Ref Range Status   Specimen Description BLOOD LEFT HAND   Final   Special Requests BOTTLES DRAWN AEROBIC AND ANAEROBIC [facility] EACH   Final   Culture  Setup Time     Final   Value: 11/21/2013 13:23     Performed at [facility]   Culture     Final   Value:        BLOOD CULTURE RECEIVED NO GROWTH TO DATE CULTURE WILL BE HELD FOR 5 DAYS BEFORE ISSUING A FINAL NEGATIVE REPORT     Performed at [facility]   Report Status PENDING   Incomplete  CULTURE, BLOOD (ROUTINE X 2)     Status: None   Collection Time    11/21/13  9:50 AM      Result Value Ref Range Status   Specimen Description BLOOD RIGHT ARM   Final   Special Requests BOTTLES DRAWN AEROBIC AND ANAEROBIC 5 CC EACH   Final   Culture  Setup Time     Final   Value: 11/21/2013 13:22     Performed at [facility]   Culture     Final   Value:        BLOOD CULTURE RECEIVED NO GROWTH TO DATE CULTURE WILL BE HELD FOR 5 DAYS BEFORE ISSUING A FINAL NEGATIVE REPORT     Performed at [facility]   Report Status PENDING   Incomplete    Studies/Results: No results found.    Assessment/Plan:  Principal Problem:   Acute respiratory distress Active Problems:   Dehydration   Hypotension   CMML (chronic myelomonocytic leukemia)   Open abdominal wall wound   Ileostomy status   Hypocalcemia   Anemia of other chronic disease   Metabolic acidosis   COPD (chronic obstructive pulmonary disease)   Anemia of chronic disease  UTI (lower urinary tract infection)   Weakness   UTI (urinary tract infection)   Severe sepsis   ARF (acute renal failure)   Urinary retention   CAP (community acquired pneumonia)   Leukocytosis, unspecified   Protein-calorie malnutrition, severe    [name] is a 63 y.o. female with CML receiving chemotherapy sp epogen admitted with SIRS, hypovolemia, found to have multiple pulmonary nodules, being rx for CAP (sans MRSA coverage) now with MRSA in urine cx but blood cultures negative   #1 MRSA in the urine: ID dictum is that MRSA in the urine is sign of overt or occult MRSA bacteremia that seeded the urine, outside of a few scenarios including those with indwelling catheters, improper collection of urine.  In this highly compromised patient and with pulnonary nodules I worry that she could have been having occult bacteremias and could even have right sided endocarditis with lung nodules representing possible septic emboli.  2 D echo without vegetations   --IF IT CAN BE SAFELY DONE I WOULD RECOMMEND GETTING TEE ON Monday Tuesday NEXT WEEK AT [place] TO TRY TO RULE IN OR OUT ENDOCARDITIS --CONTINUE ZYVOX FOR NOW BUT IF SHE NEEDS ENDOCARDITIS DURATION THERAPY WOULD SWITCH TO TEFLARO 600MG  Q 12 IV  #2 Lung nodules: Agree with continued antibiotics but would narrow  to zyvox and aztreonam for now, dc FQ  #3 Screening: HIV negative and hep panel negative    LOS: 4 days   Alcide Evener 11/22/2013, 3:45 PM

## 2013-11-22 NOTE — Progress Notes (Signed)
CARE MANAGEMENT NOTE 11/22/2013  Patient:  [name], [name]   Account Number:  [id number]  Date Initiated:  11/19/2013  Documentation initiated by:  Lessie Manigo  Subjective/Objective Assessment:   8/4 tx to icu shock, metabolic disarray     Action/Plan:   home when stable   Anticipated DC Date:  11/25/2013   Anticipated DC Plan:  HOME/SELF CARE  In-house referral  NA      DC Planning Services  NA      [facility] Choice  NA   Choice offered to / List presented to:  NA   DME arranged  NA      DME agency  NA     [place] arranged  NA      [place] agency  NA   Status of service:  In process, will continue to follow Medicare Important Message given?  NA - LOS <3 / Initial given by admissions (If response is "NO", the following Medicare IM given date fields will be blank) Date Medicare IM given:   Medicare IM given by:   Date Additional Medicare IM given:   Additional Medicare IM given by:    Discharge Disposition:    Per UR Regulation:  Reviewed for med. necessity/level of care/duration of stay  If discussed at [place] of Stay Meetings, dates discussed:    Comments:  [id number]/Etter Royall Rosana Hoes RN, BSN, CCM: [phone] Chart reviewed for discharge needs. None present at the time of review.  Patient is switched from iv carizem to p.o. route. Next chart review due on [id number].   [id number] Eldridge Dace, BSN, [place] [phone] Chart Reviewed for discharge and hospital needs. Discharge needs at time of review: None present will follow for needs. Review of patient progress due on [id number].

## 2013-11-22 NOTE — Progress Notes (Addendum)
Patient ID: [name], female   DOB: April 13, 1951, 63 y.o.   MRN: [id number]  [facility] PROGRESS NOTE  [name] OVF:[id number] DOB: 03/24/1951 DOA: 11/18/2013 PCP: Darlin Coco, MD  Brief narrative:  Pt is 63 yo female with HTN CMML, with ileostomy , COPD, anemia of chronic disease, presented to [facility] ED with several days duration of progressively worsening nausea with no vomiting, poor oral intake. In ED, pt noted to be hypotensive with poor compensated metabolic acidosis (ph 8.4,ZYS0 18, bicarb 6.8) along with renal failure Cr 2.23 (baseline Cr <1), CT of chest revealed multiple pulmonary nodules. PCCM asked to consult, place cvl and assist in managing her care.   Significant events since admission:  8/4 --> transferred to ICU, placed L IJ CVC  8/6 --> MRSA + urine culture   Principal Problem:  Acute hypoxic respiratory distress  - with pulmonary nodules c/w PNA, suspect HCAP but underlying malignancy not excluded so needs close follow up to ensure resolution  - pt also with COPD and ongoing tobacco abuse  - pt is clinically improving and reports feeling better - still requiring oxygen via [place], WBC trending down and WNL this AM, Tmax 100.2 F over the past 24 hours  - continue to provide supportive care with BD's, oxygen via [place]  - needs repeat CT in 4-6 weeks to ensure clearance  - if nodularity persists, will need additional evaluation, with FOB  Active Problems:  Septic shock, from HCAP, ? UTI  - resolved, lactic acid is WNL, vital signs stable  - pt completed ABX Aztreonam and Levaquin days #4  - urine culture positive for MRSA (August 6th, 2015) - changed ABX to Zyvox August 6th, 2015, continue Aztreonam day #5 - appreciate ID input  UTI  - MRSA isolated in urine  - continue Zyvox day #2 Atrial fibrillation with RVR - rate controlled, in NSR this AM - continue Metoprolol, Cardizem PO added per cardiology (August 7th, 2105) - appreciate cardiology input - pt  determined not to be [facility] candidate per her primary cardiologist  ARF (acute renal failure)  - likely from hypovolemia and developing ATN  - Cr is improving and trending down, WNL this AM  - repeat BMP in AM  Mixed AG and non-AG metabolic acidosis (delta ratio 0.4 on 8/4)  - Suspect due to high-output ileostomy and GI losses + acute renal failure  - improving  - off bicarb drip, CO2 stable per BMP this AM Hypocalcemia  - still low, continue to supplement and repeat BMP in AM  Hypokalemia with hypomagnesemia  - still low, continue to supplement and repeat BMP in AM - check Mg level in AM Open abdominal wall wound  - with iliostomy in place and with drainage  Anemia of other chronic disease  - 10 --> 8.4 --> 6.7 (August 5th, 2015) - transfused 2 U PRBC August 5th, 2015 with appropriate increase in Hg post transfusion (8.6) COPD (chronic obstructive pulmonary disease)  - continue BD's scheduled and as needed  - continue to provide nicotine patch  Protein-calorie malnutrition, severe  - advance diet as pt able to tolerate   Consultants:  PCCM  ID Procedures/Studies:  CXR 11/18/2013 1.5 x 1.5 cm nodular opacity right lower lobe. No edema or consolidation.  CXR 11/20/2013 Bibasilar pneumonia without significant change since yesterday.  CXR 11/19/2013 Nodular infiltrates, c/w PNA.  CT chest 11/18/2013 Numerous upper and lower lobe nodular densities bilaterally, R>L, ? diffuse pneumonitis vsmetastatic  Antibiotics:  Aztreonam 8/3 -->  Levaquin 8/3 --> 8/6  Zyvox 8/6 -->  Code Status: Full  Family Communication: Pt at bedside  Disposition Plan: Remains inpatient, transfer to telemetry bed   HPI/Subjective: No events overnight.   Objective: Filed Vitals:   11/22/13 0500 11/22/13 0542 11/22/13 0600 11/22/13 0700  BP: 140/78 140/78 142/83 143/78  Pulse:  83    Temp: 98.2 F (36.8 C)  98.1 F (36.7 C) 98.2 F (36.8 C)  TempSrc:      Resp: 10   15  Height:      Weight:      SpO2:         Intake/Output Summary (Last 24 hours) at 11/22/13 0723 Last data filed at 11/22/13 0500  Gross per 24 hour  Intake 1722.92 ml  Output   1105 ml  Net 617.92 ml    Exam:   General:  Pt is alert, follows commands appropriately, not in acute distress  Cardiovascular: Regular rate and rhythm, no rubs, no gallops  Respiratory: Bibasilar rhonchi, no wheezing   Abdomen: Soft, non tender, non distended, bowel sounds present, no guarding  Extremities: No edema, pulses DP and PT palpable bilaterally  Neuro: Grossly nonfocal  Data Reviewed: Basic Metabolic Panel:  Recent Labs Lab 11/19/13 0615 11/19/13 1000 11/19/13 2100 11/20/13 0344 11/21/13 0530 11/22/13 0530  NA 132*  --  134* 134* 135* 135*  K 5.0  --  3.4* 3.7 2.9* 3.5*  CL 105  --  102 100 95* 95*  CO2 9*  --  14* 18* 31 31  GLUCOSE 89  --  173* 165* 136* 118*  BUN 46*  --  34* 27* 13 9  CREATININE 1.80*  --  1.29* 1.13* 0.77 0.76  CALCIUM 5.8* 5.3* 5.4* 5.9* 5.9* 6.1*  MG  --  0.7*  --  1.5  --   --   PHOS  --  4.2  --   --   --   --    Liver Function Tests:  Recent Labs Lab 11/18/13 1945 11/19/13 1000 11/20/13 0344  AST 13 12 16   ALT 9 8 9   ALKPHOS 708* 588* 571*  BILITOT 0.4 0.4 0.4  PROT 8.0 6.7 5.8*  ALBUMIN 3.3* 2.7* 2.4*   CBC:  Recent Labs Lab 11/18/13 1945 11/19/13 0615 11/20/13 0344 11/21/13 0530 11/22/13 0530  WBC 19.7* 14.9* 9.7 7.6 9.7  NEUTROABS 13.0*  --  4.9 3.2  --   HGB 10.1* 8.4* 6.7* 8.4* 8.6*  HCT 29.7* 25.1* 19.6* 24.3* 25.7*  MCV 98.0 96.5 91.6 91.7 93.8  PLT 315 222 188 154 185   Cardiac Enzymes:  Recent Labs Lab 11/19/13 1000 11/19/13 1605 11/19/13 2200 11/20/13 1500  TROPONINI <0.30 <0.30 <0.30 <0.30   CBG:  Recent Labs Lab 11/20/13 0838  GLUCAP 175*    Recent Results (from the past 240 hour(s))  CULTURE, BLOOD (ROUTINE X 2)     Status: None   Collection Time    11/18/13 11:25 PM      Result Value Ref Range Status   Specimen Description BLOOD  L ARM   Final   Special Requests BOTTLES DRAWN AEROBIC AND ANAEROBIC 5CC   Final   Culture  Setup Time     Final   Value: 11/19/2013 03:53     Performed at [facility]   Culture     Final   Value:        BLOOD CULTURE RECEIVED NO GROWTH TO DATE CULTURE WILL BE HELD FOR  5 DAYS BEFORE ISSUING A FINAL NEGATIVE REPORT     Performed at [facility]   Report Status PENDING   Incomplete  CULTURE, BLOOD (ROUTINE X 2)     Status: None   Collection Time    11/18/13 11:30 PM      Result Value Ref Range Status   Specimen Description BLOOD LEFT ANTECUBITAL   Final   Special Requests BOTTLES DRAWN AEROBIC AND ANAEROBIC 5CC   Final   Culture  Setup Time     Final   Value: 11/19/2013 03:54     Performed at [facility]   Culture     Final   Value:        BLOOD CULTURE RECEIVED NO GROWTH TO DATE CULTURE WILL BE HELD FOR 5 DAYS BEFORE ISSUING A FINAL NEGATIVE REPORT     Performed at [facility]   Report Status PENDING   Incomplete  MRSA PCR SCREENING     Status: Abnormal   Collection Time    11/19/13  2:45 AM      Result Value Ref Range Status   MRSA by PCR POSITIVE (*) NEGATIVE Final   Comment:            The GeneXpert MRSA Assay (FDA     approved for NASAL specimens     only), is one component of a     comprehensive MRSA colonization     surveillance program. It is not     intended to diagnose MRSA     infection nor to guide or     monitor treatment for     MRSA infections.     RESULT CALLED TO, READ BACK BY AND VERIFIED WITH:     JOHNSON RN AT 0510 ON 08.04.15 BY SHUEA  URINE CULTURE     Status: None   Collection Time    11/19/13  8:49 AM      Result Value Ref Range Status   Specimen Description URINE, CATHETERIZED   Final   Special Requests NONE   Final   Culture  Setup Time     Final   Value: 11/19/2013 11:10     Performed at [place]     Final   Value: >=100,000 COLONIES/ML     Performed at [facility]    Culture     Final   Value: METHICILLIN RESISTANT STAPHYLOCOCCUS AUREUS     Note: RIFAMPIN AND GENTAMICIN SHOULD NOT BE USED AS SINGLE DRUGS FOR TREATMENT OF STAPH INFECTIONS. CRITICAL RESULT CALLED TO, READ BACK BY AND VERIFIED WITH: STEPHANIE DILLON @ 8:31AM 11/21/13 BY DWEEKS     Performed at [facility]   Report Status 11/21/2013 FINAL   Final   Organism ID, Bacteria METHICILLIN RESISTANT STAPHYLOCOCCUS AUREUS   Final     Scheduled Meds: . aspirin  325 mg Oral q morning - 10a  . aztreonam  1 g Intravenous Q8H  . buPROPion  300 mg Oral QAC breakfast  . calcium citrate  500 mg of elemental c Oral QID  . cholecalciferol  1,000 Units Oral Daily  . enoxaparin  injection  40 mg Subcutaneous Q24H  . estradiol  2 mg Oral QAC breakfast  . ipratropium  0.5 mg Nebulization TID  . levalbuterol  0.63 mg Nebulization TID  . linezolid  600 mg Intravenous Q12H  . metoprolol tartrate  25 mg Oral 4 times per day  . mupirocin ointment  1  application Nasal BID  . nicotine  14 mg Transdermal Daily  . pantoprazole) IV  40 mg Intravenous Daily   Continuous Infusions: . diltiazem (CARDIZEM) infusion 10 mg/hr (11/22/13 0500)     Faye Ramsay, MD  [facility] Pager [phone]  If 7PM-7AM, please contact night-coverage [email or web address] Password [email or web address] 11/22/2013, 7:23 AM   LOS: 4 days

## 2013-11-22 NOTE — Progress Notes (Signed)
    SUBJECTIVE:  Weak and nauseated still.  No dyspnea   PHYSICAL EXAM Filed Vitals:   11/22/13 0500 11/22/13 0542 11/22/13 0600 11/22/13 0700  BP: 140/78 140/78 142/83 143/78  Pulse:  83    Temp: 98.2 F (36.8 C)  98.1 F (36.7 C) 98.2 F (36.8 C)  TempSrc:      Resp: 10   15  Height:      Weight:      SpO2:       General:  Chronically ill appearing Lungs:  Decreased breath sounds Heart:  RRR Abdomen:  Positive bowel sounds, no rebound no guarding Extremities:  Mild edema   LABS:  Results for orders placed during the hospital encounter of 11/18/13 (from the past 24 hour(s))  BASIC METABOLIC PANEL     Status: Abnormal   Collection Time    11/22/13  5:30 AM      Result Value Ref Range   Sodium 135 (*) 137 - 147 mEq/L   Potassium 3.5 (*) 3.7 - 5.3 mEq/L   Chloride 95 (*) 96 - 112 mEq/L   CO2 31  19 - 32 mEq/L   Glucose, Bld 118 (*) 70 - 99 mg/dL   BUN 9  6 - 23 mg/dL   Creatinine, Ser 0.76  0.50 - 1.10 mg/dL   Calcium 6.1 (*) 8.4 - 10.5 mg/dL   GFR calc non Af Amer 87 (*) >90 mL/min   GFR calc Af Amer >90  >90 mL/min   Anion gap 9  5 - 15  CBC     Status: Abnormal   Collection Time    11/22/13  5:30 AM      Result Value Ref Range   WBC 9.7  4.0 - 10.5 K/uL   RBC 2.74 (*) 3.87 - 5.11 MIL/uL   Hemoglobin 8.6 (*) 12.0 - 15.0 g/dL   HCT 25.7 (*) 36.0 - 46.0 %   MCV 93.8  78.0 - 100.0 fL   MCH 31.4  26.0 - 34.0 pg   MCHC 33.5  30.0 - 36.0 g/dL   RDW 19.4 (*) 11.5 - 15.5 %   Platelets 185  150 - 400 K/uL    Intake/Output Summary (Last 24 hours) at 11/22/13 0720 Last data filed at 11/22/13 0500  Gross per 24 hour  Intake 1722.92 ml  Output   1105 ml  Net 617.92 ml    ASSESSMENT AND PLAN:  ATRIAL FIB/RVR:   Now in NSR.  I will change to PO Cardizem and continue the beta blocker  ACUTE RESPIRATORY FAILURE:   She is in no acute distress.  Plan per primary team.    Minus Breeding 11/22/2013 7:20 AM

## 2013-11-22 NOTE — Plan of Care (Signed)
Problem: Phase I Progression Outcomes Goal: Pain controlled with appropriate interventions Outcome: Progressing Pt is still requiring PRN pain medication

## 2013-11-22 NOTE — Plan of Care (Signed)
Problem: Phase I Progression Outcomes Goal: Hemodynamically stable Outcome: Progressing PO cardizem, taken off cardizem drip, currently NSR, rate controlled.

## 2013-11-23 LAB — BASIC METABOLIC PANEL
ANION GAP: 11 (ref 5–15)
BUN: 9 mg/dL (ref 6–23)
CALCIUM: 6.1 mg/dL — AB (ref 8.4–10.5)
CO2: 28 mEq/L (ref 19–32)
Chloride: 89 mEq/L — ABNORMAL LOW (ref 96–112)
Creatinine, Ser: 0.78 mg/dL (ref 0.50–1.10)
GFR calc Af Amer: >90 mL/min (ref >90)
GFR calc non Af Amer: 87 mL/min — ABNORMAL LOW (ref >90)
Glucose, Bld: 87 mg/dL (ref 70–99)
Potassium: 4 mEq/L (ref 3.7–5.3)
SODIUM: 128 meq/L — AB (ref 137–147)

## 2013-11-23 LAB — OCCULT BLOOD X 1 CARD TO LAB, STOOL: Fecal Occult Bld: NEGATIVE

## 2013-11-23 LAB — MAGNESIUM: MAGNESIUM: 0.6 mg/dL — AB (ref 1.5–2.5)

## 2013-11-23 MED ORDER — MAGNESIUM SULFATE 40 MG/ML IJ SOLN
2.0000 g | Freq: Once | INTRAMUSCULAR | Status: AC
  Administered 2013-11-23: 2 g via INTRAVENOUS
  Filled 2013-11-23: qty 50

## 2013-11-23 MED ORDER — SODIUM CHLORIDE 0.9 % IV SOLN
1.0000 g | Freq: Once | INTRAVENOUS | Status: AC
  Administered 2013-11-23: 1 g via INTRAVENOUS
  Filled 2013-11-23: qty 10

## 2013-11-23 NOTE — Progress Notes (Signed)
Patient ID: [name], female   DOB: 1950/10/11, 63 y.o.   MRN: [id number]  [facility] PROGRESS NOTE  [name] GGE:[id number] DOB: 1950-05-27 DOA: 11/18/2013 PCP: Darlin Coco, MD  Brief narrative:  Pt is 63 yo female with HTN CMML, with ileostomy , COPD, anemia of chronic disease, presented to [facility] ED with several days duration of progressively worsening nausea with no vomiting, poor oral intake. In ED, pt noted to be hypotensive with poor compensated metabolic acidosis (ph 4.6,TKP5 18, bicarb 6.8) along with renal failure Cr 2.23 (baseline Cr <1), CT of chest revealed multiple pulmonary nodules. PCCM asked to consult, place cvl and assist in managing her care.   Significant events since admission:  8/4 --> transferred to ICU, placed L IJ CVC  8/6 --> MRSA + urine culture   Principal Problem:  Acute hypoxic respiratory distress  - with pulmonary nodules c/w PNA, suspect HCAP but underlying malignancy not excluded so needs close follow up to ensure resolution  - pt also with COPD and ongoing tobacco abuse  - pt is clinically improving  - still requiring oxygen via [place], WBC trending down and WNL this AM, Tmax 99.1 F over the past 24 hours  - continue to provide supportive care with BD's, oxygen via [place]  - needs repeat CT in 4-6 weeks to ensure clearance  - if nodularity persists, will need additional evaluation, with FOB  Active Problems:  Septic shock, from HCAP, MRSA UTI  - resolved, lactic acid is WNL, vital signs stable  - pt completed ABX Aztreonam and Levaquin days #4 - urine culture positive for MRSA (August 6th, 2015)  - changed ABX to Zyvox August 6th, 2015, continue Aztreonam day #6 - appreciate ID input  - plan for TEE Monday  UTI  - MRSA isolated in urine  - continue Zyvox day #3 - TEE Monday  Atrial fibrillation with RVR  - rate controlled, in NSR this AM  - continue Metoprolol, Cardizem PO added per cardiology (August 7th, 2105)  - pt determined not to be  [facility] candidate per her primary cardiologist  ARF (acute renal failure)  - likely from hypovolemia and developing ATN  - Cr is improving and trending down, WNL this AM  Mixed AG and non-AG metabolic acidosis (delta ratio 0.4 on 8/4)  - Suspect due to high-output ileostomy and GI losses + acute renal failure  - resolved  Hypocalcemia  - continue to supplement and repeat BMP in AM  Hypokalemia with hypomagnesemia  - still low Mg so will continue to supplement - K is WNL this AM Open abdominal wall wound  - with iliostomy in place and with drainage  Anemia of other chronic disease  - 10 --> 8.4 --> 6.7 (August 5th, 2015)  - transfused 2 U PRBC August 5th, 2015 with appropriate increase in Hg post transfusion (8.6)  COPD (chronic obstructive pulmonary disease)  - continue BD's scheduled and as needed  - continue to provide nicotine patch  Protein-calorie malnutrition, severe  - advance diet as pt able to tolerate   Consultants:  PCCM  ID Cardio Procedures/Studies:  CXR 11/18/2013 1.5 x 1.5 cm nodular opacity right lower lobe. No edema or consolidation.  CXR 11/20/2013 Bibasilar pneumonia without significant change since yesterday.  CXR 11/19/2013 Nodular infiltrates, c/w PNA.  CT chest 11/18/2013 Numerous upper and lower lobe nodular densities bilaterally, R>L, ? diffuse pneumonitis vsmetastatic  Antibiotics:  Aztreonam 8/3 -->  Levaquin 8/3 --> 8/6  Zyvox 8/6 -->  Code  Status: Full  Family Communication: Pt at bedside  Disposition Plan: Remains inpatient  HPI/Subjective: No events overnight.   Objective: Filed Vitals:   11/22/13 2056 11/23/13 0014 11/23/13 0508 11/23/13 0823  BP: 151/97 142/91 157/87   Pulse: 98 93 85   Temp: 98.2 F (36.8 C) 98.1 F (36.7 C) 98.2 F (36.8 C)   TempSrc: Oral Oral Oral   Resp: 18 18 16    Height:      Weight:   62.6 kg (138 lb 0.1 oz)   SpO2: 100% 98% 95% 92%    Intake/Output Summary (Last 24 hours) at 11/23/13 1117 Last data filed at  11/23/13 0900  Gross per 24 hour  Intake    760 ml  Output   1375 ml  Net   -615 ml    Exam:   General:  Pt is alert, follows commands appropriately, not in acute distress  Cardiovascular: Regular rate and rhythm, S1/S2, no murmurs, no rubs, no gallops  Respiratory: Clear to auscultation bilaterally, no wheezing, rhonchi at bases   Abdomen: Soft, non tender, non distended, bowel sounds present, no guardingl  Data Reviewed: Basic Metabolic Panel:  Recent Labs Lab 11/19/13 1000 11/19/13 2100 11/20/13 0344 11/21/13 0530 11/22/13 0530 11/23/13 0440  NA  --  134* 134* 135* 135* 128*  K  --  3.4* 3.7 2.9* 3.5* 4.0  CL  --  102 100 95* 95* 89*  CO2  --  14* 18* 31 31 28   GLUCOSE  --  173* 165* 136* 118* 87  BUN  --  34* 27* 13 9 9   CREATININE  --  1.29* 1.13* 0.77 0.76 0.78  CALCIUM 5.3* 5.4* 5.9* 5.9* 6.1* 6.1*  MG 0.7*  --  1.5  --   --  0.6*  PHOS 4.2  --   --   --   --   --    Liver Function Tests:  Recent Labs Lab 11/18/13 1945 11/19/13 1000 11/20/13 0344  AST 13 12 16   ALT 9 8 9   ALKPHOS 708* 588* 571*  BILITOT 0.4 0.4 0.4  PROT 8.0 6.7 5.8*  ALBUMIN 3.3* 2.7* 2.4*   CBC:  Recent Labs Lab 11/18/13 1945 11/19/13 0615 11/20/13 0344 11/21/13 0530 11/22/13 0530  WBC 19.7* 14.9* 9.7 7.6 9.7  NEUTROABS 13.0*  --  4.9 3.2  --   HGB 10.1* 8.4* 6.7* 8.4* 8.6*  HCT 29.7* 25.1* 19.6* 24.3* 25.7*  MCV 98.0 96.5 91.6 91.7 93.8  PLT 315 222 188 154 185   Cardiac Enzymes:  Recent Labs Lab 11/19/13 1000 11/19/13 1605 11/19/13 2200 11/20/13 1500  TROPONINI <0.30 <0.30 <0.30 <0.30    CBG:  Recent Labs Lab 11/20/13 0838  GLUCAP 175*    Recent Results (from the past 240 hour(s))  CULTURE, BLOOD (ROUTINE X 2)     Status: None   Collection Time    11/18/13 11:25 PM      Result Value Ref Range Status   Specimen Description BLOOD L ARM   Final   Special Requests BOTTLES DRAWN AEROBIC AND ANAEROBIC 5CC   Final   Culture  Setup Time     Final    Value: 11/19/2013 03:53     Performed at [facility]   Culture     Final   Value:        BLOOD CULTURE RECEIVED NO GROWTH TO DATE CULTURE WILL BE HELD FOR 5 DAYS BEFORE ISSUING A FINAL NEGATIVE REPORT  Performed at [facility]   Report Status PENDING   Incomplete  CULTURE, BLOOD (ROUTINE X 2)     Status: None   Collection Time    11/18/13 11:30 PM      Result Value Ref Range Status   Specimen Description BLOOD LEFT ANTECUBITAL   Final   Special Requests BOTTLES DRAWN AEROBIC AND ANAEROBIC 5CC   Final   Culture  Setup Time     Final   Value: 11/19/2013 03:54     Performed at [facility]   Culture     Final   Value:        BLOOD CULTURE RECEIVED NO GROWTH TO DATE CULTURE WILL BE HELD FOR 5 DAYS BEFORE ISSUING A FINAL NEGATIVE REPORT     Performed at [facility]   Report Status PENDING   Incomplete  MRSA PCR SCREENING     Status: Abnormal   Collection Time    11/19/13  2:45 AM      Result Value Ref Range Status   MRSA by PCR POSITIVE (*) NEGATIVE Final   Comment:            The GeneXpert MRSA Assay (FDA     approved for NASAL specimens     only), is one component of a     comprehensive MRSA colonization     surveillance program. It is not     intended to diagnose MRSA     infection nor to guide or     monitor treatment for     MRSA infections.     RESULT CALLED TO, READ BACK BY AND VERIFIED WITH:     JOHNSON RN AT 0510 ON 08.04.15 BY SHUEA  URINE CULTURE     Status: None   Collection Time    11/19/13  8:49 AM      Result Value Ref Range Status   Specimen Description URINE, CATHETERIZED   Final   Special Requests NONE   Final   Culture  Setup Time     Final   Value: 11/19/2013 11:10     Performed at [place]     Final   Value: >=100,000 COLONIES/ML     Performed at [facility]   Culture     Final   Value: METHICILLIN RESISTANT STAPHYLOCOCCUS AUREUS     Note: RIFAMPIN AND GENTAMICIN SHOULD NOT BE  USED AS SINGLE DRUGS FOR TREATMENT OF STAPH INFECTIONS. CRITICAL RESULT CALLED TO, READ BACK BY AND VERIFIED WITH: STEPHANIE DILLON @ 8:31AM 11/21/13 BY DWEEKS     Performed at [facility]   Report Status 11/21/2013 FINAL   Final   Organism ID, Bacteria METHICILLIN RESISTANT STAPHYLOCOCCUS AUREUS   Final  CULTURE, BLOOD (ROUTINE X 2)     Status: None   Collection Time    11/21/13  9:50 AM      Result Value Ref Range Status   Specimen Description BLOOD LEFT HAND   Final   Special Requests BOTTLES DRAWN AEROBIC AND ANAEROBIC [facility] EACH   Final   Culture  Setup Time     Final   Value: 11/21/2013 13:23     Performed at [facility]   Culture     Final   Value:        BLOOD CULTURE RECEIVED NO GROWTH TO DATE CULTURE WILL BE HELD FOR 5 DAYS BEFORE ISSUING A FINAL NEGATIVE REPORT     Performed at  [facility]   Report Status PENDING   Incomplete  CULTURE, BLOOD (ROUTINE X 2)     Status: None   Collection Time    11/21/13  9:50 AM      Result Value Ref Range Status   Specimen Description BLOOD RIGHT ARM   Final   Special Requests BOTTLES DRAWN AEROBIC AND ANAEROBIC 5 CC EACH   Final   Culture  Setup Time     Final   Value: 11/21/2013 13:22     Performed at [facility]   Culture     Final   Value:        BLOOD CULTURE RECEIVED NO GROWTH TO DATE CULTURE WILL BE HELD FOR 5 DAYS BEFORE ISSUING A FINAL NEGATIVE REPORT     Performed at [facility]   Report Status PENDING   Incomplete     Scheduled Meds: . sodium chloride   Intravenous Once  . antiseptic oral rinse  7 mL Mouth Rinse BID  . aspirin  325 mg Oral q morning - 10a  . aztreonam  1 g Intravenous Q8H  . buPROPion  300 mg Oral QAC breakfast  . calcium citrate  500 mg of elemental calcium Oral QID  . chlorhexidine  15 mL Mouth Rinse BID  . Chlorhexidine Gluconate Cloth  6 each Topical Q0600  . cholecalciferol  1,000 Units Oral Daily  . diltiazem  30 mg Oral 4 times per day  . enoxaparin  (LOVENOX) injection  40 mg Subcutaneous Q24H  . estradiol  2 mg Oral QAC breakfast  . feeding supplement (ENSURE COMPLETE)  237 mL Oral TID BM  . linezolid  600 mg Intravenous Q12H  . metoprolol tartrate  25 mg Oral 4 times per day  . mupirocin ointment  1 application Nasal BID  . nicotine  14 mg Transdermal Daily  . pantoprazole (PROTONIX) IV  40 mg Intravenous Daily  . silver nitrate applicators  1 application Topical q morning - 10a   Continuous Infusions:    Faye Ramsay, MD  [facility] Pager [phone]  If 7PM-7AM, please contact night-coverage [email or web address] Password [email or web address] 11/23/2013, 11:17 AM   LOS: 5 days

## 2013-11-23 NOTE — Progress Notes (Signed)
    SUBJECTIVE:  She is improved.  No pain.     PHYSICAL EXAM Filed Vitals:   11/22/13 2039 11/22/13 2056 11/23/13 0014 11/23/13 0508  BP:  151/97 142/91 157/87  Pulse:  98 93 85  Temp:  98.2 F (36.8 C) 98.1 F (36.7 C) 98.2 F (36.8 C)  TempSrc:  Oral Oral Oral  Resp:  18 18 16   Height:      Weight:    138 lb 0.1 oz (62.6 kg)  SpO2: 97% 100% 98% 95%   General:  Chronically ill appearing Lungs:  Decreased breath sounds Heart:  RRR Abdomen:  Positive bowel sounds, no rebound no guarding Extremities:  Mild edema   LABS:  Results for orders placed during the hospital encounter of 11/18/13 (from the past 24 hour(s))  BASIC METABOLIC PANEL     Status: Abnormal   Collection Time    11/23/13  4:40 AM      Result Value Ref Range   Sodium 128 (*) 137 - 147 mEq/L   Potassium 4.0  3.7 - 5.3 mEq/L   Chloride 89 (*) 96 - 112 mEq/L   CO2 28  19 - 32 mEq/L   Glucose, Bld 87  70 - 99 mg/dL   BUN 9  6 - 23 mg/dL   Creatinine, Ser 0.78  0.50 - 1.10 mg/dL   Calcium 6.1 (*) 8.4 - 10.5 mg/dL   GFR calc non Af Amer 87 (*) >90 mL/min   GFR calc Af Amer >90  >90 mL/min   Anion gap 11  5 - 15  MAGNESIUM     Status: Abnormal   Collection Time    11/23/13  4:40 AM      Result Value Ref Range   Magnesium 0.6 (*) 1.5 - 2.5 mg/dL    Intake/Output Summary (Last 24 hours) at 11/23/13 0806 Last data filed at 11/23/13 0543  Gross per 24 hour  Intake    630 ml  Output   1375 ml  Net   -745 ml    ASSESSMENT AND PLAN:  ATRIAL FIB/RVR:   Now in NSR with short paroxysms of fib.  Tolerating both beta blocker and calcium channel blocker.  We can consolidate to long acting before discharge.    ACUTE RESPIRATORY FAILURE:   She is in no acute distress.  Plan per primary team.   MRSA in the urine:  Per Dr. Tommy Medal we will try to coordinate a TEE for Monday to rule out endocarditis.    Jeneen Rinks [facility] 11/23/2013 8:06 AM

## 2013-11-24 LAB — BASIC METABOLIC PANEL
ANION GAP: 13 (ref 5–15)
BUN: 10 mg/dL (ref 6–23)
CO2: 27 meq/L (ref 19–32)
CREATININE: 0.75 mg/dL (ref 0.50–1.10)
Calcium: 6.3 mg/dL — CL (ref 8.4–10.5)
Chloride: 87 mEq/L — ABNORMAL LOW (ref 96–112)
GFR calc non Af Amer: 88 mL/min — ABNORMAL LOW (ref >90)
Glucose, Bld: 95 mg/dL (ref 70–99)
Potassium: 3.5 mEq/L — ABNORMAL LOW (ref 3.7–5.3)
Sodium: 127 mEq/L — ABNORMAL LOW (ref 137–147)

## 2013-11-24 LAB — OCCULT BLOOD X 1 CARD TO LAB, STOOL: Fecal Occult Bld: POSITIVE — AB

## 2013-11-24 LAB — MAGNESIUM: Magnesium: 1.3 mg/dL — ABNORMAL LOW (ref 1.5–2.5)

## 2013-11-24 LAB — VITAMIN D 1,25 DIHYDROXY
Vitamin D 1, 25 (OH)2 Total: 12 pg/mL — ABNORMAL LOW (ref 18–72)
Vitamin D2 1, 25 (OH)2: 12 pg/mL
Vitamin D3 1, 25 (OH)2: <8 pg/mL

## 2013-11-24 MED ORDER — MAGNESIUM SULFATE IN D5W 10-5 MG/ML-% IV SOLN
1.0000 g | Freq: Once | INTRAVENOUS | Status: AC
  Administered 2013-11-24: 1 g via INTRAVENOUS
  Filled 2013-11-24: qty 100

## 2013-11-24 MED ORDER — SODIUM CHLORIDE 0.9 % IV SOLN
1.0000 g | Freq: Once | INTRAVENOUS | Status: AC
  Administered 2013-11-24: 1 g via INTRAVENOUS
  Filled 2013-11-24: qty 10

## 2013-11-24 MED ORDER — POTASSIUM CHLORIDE 10 MEQ/100ML IV SOLN
10.0000 meq | INTRAVENOUS | Status: AC
  Administered 2013-11-24 (×2): 10 meq via INTRAVENOUS
  Filled 2013-11-24 (×2): qty 100

## 2013-11-24 MED ORDER — DILTIAZEM HCL ER COATED BEADS 120 MG PO CP24
120.0000 mg | ORAL_CAPSULE | Freq: Every day | ORAL | Status: DC
  Administered 2013-11-24 – 2013-11-27 (×3): 120 mg via ORAL
  Filled 2013-11-24 (×4): qty 1

## 2013-11-24 MED ORDER — METOPROLOL TARTRATE 50 MG PO TABS
50.0000 mg | ORAL_TABLET | Freq: Two times a day (BID) | ORAL | Status: DC
  Administered 2013-11-24 – 2013-11-27 (×5): 50 mg via ORAL
  Filled 2013-11-24 (×7): qty 1

## 2013-11-24 MED ORDER — MAGNESIUM SULFATE 4000MG/100ML IJ SOLN
4.0000 g | Freq: Once | INTRAMUSCULAR | Status: AC
  Administered 2013-11-24: 4 g via INTRAVENOUS
  Filled 2013-11-24: qty 100

## 2013-11-24 NOTE — Progress Notes (Signed)
ANTIBIOTIC CONSULT NOTE - follow up  Pharmacy Consult for Azactam, Zyvox per Md Indication: HCAP, MRSA UTI  Allergies  Allergen Reactions  . Vancomycin Hives and Rash    wheezing  . Ativan [Lorazepam] Other (See Comments)    "makes me crazy"  . Codeine Nausea And Vomiting  . Tetanus Toxoids Other (See Comments)    Serum reaction  . Penicillins Hives and Rash  . Xarelto [Rivaroxaban] Hives and Rash    (May not be allergic. Vancomycin was also being taken when reaction occurred.)    Patient Measurements: Height: 5\' 5"  (165.1 cm) Weight: 140 lb 4.8 oz (63.64 kg) IBW/kg (Calculated) : 57   Vital Signs: Temp: 97.9 F (36.6 C) (08/09 0625) Temp src: Oral (08/09 0625) BP: 149/90 mmHg (08/09 0625) Pulse Rate: 92 (08/09 0625) Intake/Output from previous day: 08/08 0701 - 08/09 0700 In: 2570 [P.O.:1420; I.V.:450; IV Piggyback:700] Out: 2025 [Urine:1825; Stool:200] Intake/Output from this shift: Total I/O In: 360 [P.O.:360] Out: -   Labs:  Recent Labs  11/22/13 0530 11/23/13 0440 11/24/13 0430  WBC 9.7  --   --   HGB 8.6*  --   --   PLT 185  --   --   CREATININE 0.76 0.78 0.75   Estimated Creatinine Clearance: 63.9 ml/min (by C-G formula based on Cr of 0.75). No results found for this basename: VANCOTROUGH, VANCOPEAK, VANCORANDOM, [place], [place], [place], [place], [place], TOBRARND, AMIKACINPEAK, AMIKACINTROU, AMIKACIN,  in the last 72 hours   Microbiology: Recent Results (from the past 720 hour(s))  TECHNOLOGIST REVIEW     Status: None   Collection Time    11/05/13 11:39 AM      Result Value Ref Range Status   Technologist Review Rare NRBC and Rare Myelo   Final  CULTURE, BLOOD (ROUTINE X 2)     Status: None   Collection Time    11/18/13 11:25 PM      Result Value Ref Range Status   Specimen Description BLOOD L ARM   Final   Special Requests BOTTLES DRAWN AEROBIC AND ANAEROBIC 5CC   Final   Culture  Setup Time     Final   Value: 11/19/2013  03:53     Performed at [facility]   Culture     Final   Value:        BLOOD CULTURE RECEIVED NO GROWTH TO DATE CULTURE WILL BE HELD FOR 5 DAYS BEFORE ISSUING A FINAL NEGATIVE REPORT     Performed at [facility]   Report Status PENDING   Incomplete  CULTURE, BLOOD (ROUTINE X 2)     Status: None   Collection Time    11/18/13 11:30 PM      Result Value Ref Range Status   Specimen Description BLOOD LEFT ANTECUBITAL   Final   Special Requests BOTTLES DRAWN AEROBIC AND ANAEROBIC 5CC   Final   Culture  Setup Time     Final   Value: 11/19/2013 03:54     Performed at [facility]   Culture     Final   Value:        BLOOD CULTURE RECEIVED NO GROWTH TO DATE CULTURE WILL BE HELD FOR 5 DAYS BEFORE ISSUING A FINAL NEGATIVE REPORT     Performed at [facility]   Report Status PENDING   Incomplete  MRSA PCR SCREENING     Status: Abnormal   Collection Time    11/19/13  2:45 AM  Result Value Ref Range Status   MRSA by PCR POSITIVE (*) NEGATIVE Final   Comment:            The GeneXpert MRSA Assay (FDA     approved for NASAL specimens     only), is one component of a     comprehensive MRSA colonization     surveillance program. It is not     intended to diagnose MRSA     infection nor to guide or     monitor treatment for     MRSA infections.     RESULT CALLED TO, READ BACK BY AND VERIFIED WITH:     JOHNSON RN AT 0510 ON 08.04.15 BY SHUEA  URINE CULTURE     Status: None   Collection Time    11/19/13  8:49 AM      Result Value Ref Range Status   Specimen Description URINE, CATHETERIZED   Final   Special Requests NONE   Final   Culture  Setup Time     Final   Value: 11/19/2013 11:10     Performed at [facility]     Final   Value: >=100,000 COLONIES/ML     Performed at [facility]   Culture     Final   Value: METHICILLIN RESISTANT STAPHYLOCOCCUS AUREUS     Note: RIFAMPIN AND GENTAMICIN SHOULD NOT BE USED AS SINGLE  DRUGS FOR TREATMENT OF STAPH INFECTIONS. CRITICAL RESULT CALLED TO, READ BACK BY AND VERIFIED WITH: STEPHANIE DILLON @ 8:31AM 11/21/13 BY DWEEKS     Performed at [facility]   Report Status 11/21/2013 FINAL   Final   Organism ID, Bacteria METHICILLIN RESISTANT STAPHYLOCOCCUS AUREUS   Final  CULTURE, BLOOD (ROUTINE X 2)     Status: None   Collection Time    11/21/13  9:50 AM      Result Value Ref Range Status   Specimen Description BLOOD LEFT HAND   Final   Special Requests BOTTLES DRAWN AEROBIC AND ANAEROBIC [facility] EACH   Final   Culture  Setup Time     Final   Value: 11/21/2013 13:23     Performed at [facility]   Culture     Final   Value:        BLOOD CULTURE RECEIVED NO GROWTH TO DATE CULTURE WILL BE HELD FOR 5 DAYS BEFORE ISSUING A FINAL NEGATIVE REPORT     Performed at [facility]   Report Status PENDING   Incomplete  CULTURE, BLOOD (ROUTINE X 2)     Status: None   Collection Time    11/21/13  9:50 AM      Result Value Ref Range Status   Specimen Description BLOOD RIGHT ARM   Final   Special Requests BOTTLES DRAWN AEROBIC AND ANAEROBIC 5 CC EACH   Final   Culture  Setup Time     Final   Value: 11/21/2013 13:22     Performed at [facility]   Culture     Final   Value:        BLOOD CULTURE RECEIVED NO GROWTH TO DATE CULTURE WILL BE HELD FOR 5 DAYS BEFORE ISSUING A FINAL NEGATIVE REPORT     Performed at [facility]   Report Status PENDING   Incomplete    Medical History: Past Medical History  Diagnosis Date  . Hypertension     She has a past hx of essential  .  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 Inflammation of joint of knee     Since we last last saw her she developed problems with an acute which required surgical drainage by her orthopedist Dr. Durward Fortes.  Marland Kitchen MRSA (methicillin resistant Staphylococcus aureus)     Knee surgery drainage was positive for  MRSA and she was treated with 3 weeks of doxycycline successfully.  . Diarrhea     Mild  . Exogenous obesity   . GERD (gastroesophageal reflux disease)     2 hosp.- ischemic colitis - residual of Norovirus, 05/2011- sm. bowel obstruction  . Headache(784.0)     migraine headache on occas, less now than when she was younger   . Arthritis     L hip, back, neck   . History of blood transfusion sept 2013    04/2011- /w ischemic colitis , trouble with matching blood  sept 2013  . Anemia     will see hematology consult prior to surgery, recommended by Dr. Mare Ferrari  . Anemia 12/15/2010  . Ischemic colitis 01/31/2012  . Atrial flutter     during hospitalization, 04/2011- related to anemia & illness/stress   . Pneumonia     04/2011- not hospitalized , pt. denies SOB, changes in chest, breathing  . 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 Dizziness     occasional  . B12 deficiency 02/27/2013  . 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 COPD (chronic obstructive pulmonary disease) 09/26/2013    Medications:  Scheduled:  . sodium chloride   Intravenous Once  . antiseptic oral rinse  7 mL Mouth Rinse BID  . aspirin  325 mg Oral q morning - 10a  . aztreonam  1 g Intravenous Q8H  . buPROPion  300 mg Oral QAC breakfast  . calcium citrate  500 mg of elemental calcium Oral QID  . calcium gluconate  1 g Intravenous Once  . chlorhexidine  15 mL Mouth Rinse BID  . cholecalciferol  1,000 Units Oral Daily  . diltiazem  120 mg Oral Daily  . enoxaparin (LOVENOX) injection  40 mg Subcutaneous Q24H  . estradiol  2 mg Oral QAC breakfast  . feeding supplement (ENSURE COMPLETE)  237 mL Oral TID BM  . linezolid  600 mg Intravenous Q12H  . metoprolol tartrate  50 mg Oral BID  . nicotine  14 mg Transdermal Daily  . pantoprazole (PROTONIX) IV  40 mg Intravenous Daily  . potassium chloride  10 mEq Intravenous Q1 Hr x 2  . silver nitrate applicators  1 application Topical q morning - 10a   Infusions:     Assessment: 47 yoF with a PMH of CML on oral chemotherapy, COPD, anemia of chronic disease, HTN, and s/p ileostomy presents to the ED with main concern of several days duration of poor oral intake, nausea, and says it started after she was given epogen by her oncologist for anemia. Azactam and Levaquin for urosepsis, possible PNA per Rx on 8/4  8/4 >> Levaquin >> 8/6 8/4 >> Azactam 1g q8 >> 8/6 >> Zyvox 600mg  IV q12 >>  Day #6 Azactam, D#4 Zyvox   Afebrile  WNL  SCr stable, CrCl 64  8/4 urine: MRSA   Goal of Therapy:  Treat infection  Plan:  1) Continue current dosing of aztreonam 2) Noted plan per ID note for length of treatment with abx's 3) per ID, plan TEE to  rule out endocarditis   Adrian Saran, PharmD, BCPS Pager [phone] 11/24/2013 9:46 AM

## 2013-11-24 NOTE — Progress Notes (Signed)
Pt c/o nausea and pain (same as last night) at 2010.  Given Dialudid and phenergan and has become confused.  Telemetry is going between Afib and ST with rates 100 to 150, 98,9, 138/102, 92% on Ra but I have placed her on 2/m [place], rr is 20, Kathline Magic on call and notified of this and her electrolyte imbalance this AM and tx today.  No new orders at this time just continue to monitor.

## 2013-11-24 NOTE — Progress Notes (Signed)
    SUBJECTIVE:  She is improved.  No pain.  Mild nausea   PHYSICAL EXAM Filed Vitals:   11/23/13 1435 11/23/13 1500 11/23/13 2119 11/24/13 0625  BP: 145/117 149/96 135/86 149/90  Pulse: 95  89 92  Temp: 98.7 F (37.1 C)  97.8 F (36.6 C) 97.9 F (36.6 C)  TempSrc: Oral  Oral Oral  Resp: 16  20 21   Height:      Weight:    140 lb 4.8 oz (63.64 kg)  SpO2: 98%  95% 90%   General:  Chronically ill appearing Lungs:  Decreased breath sounds Heart:  RRR Abdomen:  Positive bowel sounds, no rebound no guarding Extremities:  No edema   LABS:  Results for orders placed during the hospital encounter of 11/18/13 (from the past 24 hour(s))  OCCULT BLOOD X 1 CARD TO LAB, STOOL     Status: None   Collection Time    11/23/13 11:33 PM      Result Value Ref Range   Fecal Occult Bld NEGATIVE  NEGATIVE  BASIC METABOLIC PANEL     Status: Abnormal   Collection Time    11/24/13  4:30 AM      Result Value Ref Range   Sodium 127 (*) 137 - 147 mEq/L   Potassium 3.5 (*) 3.7 - 5.3 mEq/L   Chloride 87 (*) 96 - 112 mEq/L   CO2 27  19 - 32 mEq/L   Glucose, Bld 95  70 - 99 mg/dL   BUN 10  6 - 23 mg/dL   Creatinine, Ser 0.75  0.50 - 1.10 mg/dL   Calcium 6.3 (*) 8.4 - 10.5 mg/dL   GFR calc non Af Amer 88 (*) >90 mL/min   GFR calc Af Amer >90  >90 mL/min   Anion gap 13  5 - 15  MAGNESIUM     Status: Abnormal   Collection Time    11/24/13  4:30 AM      Result Value Ref Range   Magnesium 1.3 (*) 1.5 - 2.5 mg/dL    Intake/Output Summary (Last 24 hours) at 11/24/13 0831 Last data filed at 11/24/13 0600  Gross per 24 hour  Intake   2570 ml  Output   2025 ml  Net    545 ml    ASSESSMENT AND PLAN:  ATRIAL FIB/RVR:   NSR  Changed to Cardizem CD and consolidated metorpolol today.   MRSA in the urine:  Per Dr. Tommy Medal we will try to coordinate a TEE for Monday to rule out endocarditis.    Minus Breeding 11/24/2013 8:31 AM

## 2013-11-24 NOTE — Progress Notes (Signed)
Patient ID: [name], female   DOB: 1950-05-26, 63 y.o.   MRN: [id number]  [facility] PROGRESS NOTE  [name] WUJ:[id number] DOB: 23-Jul-1950 DOA: 11/18/2013 PCP: Darlin Coco, MD  Brief narrative:  Pt is 63 yo female with HTN CMML, with ileostomy , COPD, anemia of chronic disease, presented to [facility] ED with several days duration of progressively worsening nausea with no vomiting, poor oral intake. In ED, pt noted to be hypotensive with poor compensated metabolic acidosis (ph 9.5,AOZ3 18, bicarb 6.8) along with renal failure Cr 2.23 (baseline Cr <1), CT of chest revealed multiple pulmonary nodules. PCCM asked to consult, place cvl and assist in managing her care.   Significant events since admission:  8/4 --> transferred to ICU, placed L IJ CVC  8/6 --> MRSA + urine culture   Principal Problem:  Acute hypoxic respiratory distress  - with pulmonary nodules c/w PNA, suspect HCAP but underlying malignancy not excluded  - pt also with COPD and ongoing tobacco abuse  - pt is clinically improving, denies chest pain and shortness of breath this AM - WBC trending down and WNL this AM, Tmax 99.1 F over the past 24 hours  - continue to provide supportive care with BD's, oxygen via [place]  - needs repeat CT in 4-6 weeks to ensure clearance  - if nodularity persists, will need additional evaluation, with FOB  Active Problems:  Septic shock, from HCAP, MRSA UTI  - resolved, lactic acid is WNL, vital signs stable  - pt completed ABX Aztreonam and Levaquin days #4  - urine culture positive for MRSA (August 6th, 2015)  - changed ABX to Zyvox August 6th, 2015, continue Aztreonam day #7 - appreciate ID input  - plan for TEE Monday  UTI  - MRSA isolated in urine  - continue Zyvox day #4 - TEE Monday  Atrial fibrillation with RVR  - rate controlled, in NSR this AM  - continue Metoprolol, Cardizem PO added per cardiology (August 7th, 2105)  - pt determined not to be [facility] candidate per her  primary cardiologist  ARF (acute renal failure)  - likely from hypovolemia and developing ATN  - Cr is improving and trending down, WNL this AM  Mixed AG and non-AG metabolic acidosis (delta ratio 0.4 on 8/4)  - Suspect due to high-output ileostomy and GI losses + acute renal failure  - resolved  Hypocalcemia  - continue to supplement and repeat BMP in AM  Hypokalemia with hypomagnesemia  - still low Mg and K, so will continue to supplement  Open abdominal wall wound  - with iliostomy in place and with drainage  Anemia of other chronic disease  - 10 --> 8.4 --> 6.7 (August 5th, 2015)  - transfused 2 U PRBC August 5th, 2015 with appropriate increase in Hg post transfusion (8.6)  COPD (chronic obstructive pulmonary disease)  - continue BD's scheduled and as needed  - continue to provide nicotine patch  Protein-calorie malnutrition, severe  - advance diet as pt able to tolerate   Consultants:  PCCM  ID  Cardio Procedures/Studies:  CXR 11/18/2013 1.5 x 1.5 cm nodular opacity right lower lobe. No edema or consolidation.  CXR 11/20/2013 Bibasilar pneumonia without significant change since yesterday.  CXR 11/19/2013 Nodular infiltrates, c/w PNA.  CT chest 11/18/2013 Numerous upper and lower lobe nodular densities bilaterally, R>L, ? diffuse pneumonitis vsmetastatic  Antibiotics:  Aztreonam 8/3 -->  Levaquin 8/3 --> 8/6  Zyvox 8/6 -->  Code Status: Full  Family Communication: Pt  at bedside  Disposition Plan: Remains inpatient     HPI/Subjective: No events overnight.   Objective: Filed Vitals:   11/23/13 1435 11/23/13 1500 11/23/13 2119 11/24/13 0625  BP: 145/117 149/96 135/86 149/90  Pulse: 95  89 92  Temp: 98.7 F (37.1 C)  97.8 F (36.6 C) 97.9 F (36.6 C)  TempSrc: Oral  Oral Oral  Resp: 16  20 21   Height:      Weight:    63.64 kg (140 lb 4.8 oz)  SpO2: 98%  95% 90%    Intake/Output Summary (Last 24 hours) at 11/24/13 1200 Last data filed at 11/24/13 0745  Gross per  24 hour  Intake   2340 ml  Output   2025 ml  Net    315 ml    Exam:   General:  Pt is alert, follows commands appropriately, not in acute distress  Cardiovascular: Regular rate and rhythm, S1/S2, no murmurs, no rubs, no gallops  Respiratory: Clear to auscultation bilaterally, minimal rales at bases   Abdomen: Soft, non tender, non distended, bowel sounds present, no guarding  Extremities: No edema, pulses DP and PT palpable bilaterally  Data Reviewed: Basic Metabolic Panel:  Recent Labs Lab 11/19/13 1000  11/20/13 0344 11/21/13 0530 11/22/13 0530 11/23/13 0440 11/24/13 0430  NA  --   < > 134* 135* 135* 128* 127*  K  --   < > 3.7 2.9* 3.5* 4.0 3.5*  CL  --   < > 100 95* 95* 89* 87*  CO2  --   < > 18* 31 31 28 27   GLUCOSE  --   < > 165* 136* 118* 87 95  BUN  --   < > 27* 13 9 9 10   CREATININE  --   < > 1.13* 0.77 0.76 0.78 0.75  CALCIUM 5.3*  < > 5.9* 5.9* 6.1* 6.1* 6.3*  MG 0.7*  --  1.5  --   --  0.6* 1.3*  PHOS 4.2  --   --   --   --   --   --   < > = values in this interval not displayed. Liver Function Tests:  Recent Labs Lab 11/18/13 1945 11/19/13 1000 11/20/13 0344  AST 13 12 16   ALT 9 8 9   ALKPHOS 708* 588* 571*  BILITOT 0.4 0.4 0.4  PROT 8.0 6.7 5.8*  ALBUMIN 3.3* 2.7* 2.4*  CBC:  Recent Labs Lab 11/18/13 1945 11/19/13 0615 11/20/13 0344 11/21/13 0530 11/22/13 0530  WBC 19.7* 14.9* 9.7 7.6 9.7  NEUTROABS 13.0*  --  4.9 3.2  --   HGB 10.1* 8.4* 6.7* 8.4* 8.6*  HCT 29.7* 25.1* 19.6* 24.3* 25.7*  MCV 98.0 96.5 91.6 91.7 93.8  PLT 315 222 188 154 185   Cardiac Enzymes:  Recent Labs Lab 11/19/13 1000 11/19/13 1605 11/19/13 2200 11/20/13 1500  TROPONINI <0.30 <0.30 <0.30 <0.30   CBG:  Recent Labs Lab 11/20/13 0838  GLUCAP 175*    Recent Results (from the past 240 hour(s))  CULTURE, BLOOD (ROUTINE X 2)     Status: None   Collection Time    11/18/13 11:25 PM      Result Value Ref Range Status   Specimen Description BLOOD L  ARM   Final   Special Requests BOTTLES DRAWN AEROBIC AND ANAEROBIC 5CC   Final   Culture  Setup Time     Final   Value: 11/19/2013 03:53     Performed at [facility]  Culture     Final   Value:        BLOOD CULTURE RECEIVED NO GROWTH TO DATE CULTURE WILL BE HELD FOR 5 DAYS BEFORE ISSUING A FINAL NEGATIVE REPORT     Performed at [facility]   Report Status PENDING   Incomplete  CULTURE, BLOOD (ROUTINE X 2)     Status: None   Collection Time    11/18/13 11:30 PM      Result Value Ref Range Status   Specimen Description BLOOD LEFT ANTECUBITAL   Final   Special Requests BOTTLES DRAWN AEROBIC AND ANAEROBIC 5CC   Final   Culture  Setup Time     Final   Value: 11/19/2013 03:54     Performed at [facility]   Culture     Final   Value:        BLOOD CULTURE RECEIVED NO GROWTH TO DATE CULTURE WILL BE HELD FOR 5 DAYS BEFORE ISSUING A FINAL NEGATIVE REPORT     Performed at [facility]   Report Status PENDING   Incomplete  MRSA PCR SCREENING     Status: Abnormal   Collection Time    11/19/13  2:45 AM      Result Value Ref Range Status   MRSA by PCR POSITIVE (*) NEGATIVE Final   Comment:            The GeneXpert MRSA Assay (FDA     approved for NASAL specimens     only), is one component of a     comprehensive MRSA colonization     surveillance program. It is not     intended to diagnose MRSA     infection nor to guide or     monitor treatment for     MRSA infections.     RESULT CALLED TO, READ BACK BY AND VERIFIED WITH:     JOHNSON RN AT 0510 ON 08.04.15 BY SHUEA  URINE CULTURE     Status: None   Collection Time    11/19/13  8:49 AM      Result Value Ref Range Status   Specimen Description URINE, CATHETERIZED   Final   Special Requests NONE   Final   Culture  Setup Time     Final   Value: 11/19/2013 11:10     Performed at [facility]     Final   Value: >=100,000 COLONIES/ML     Performed at [facility]    Culture     Final   Value: METHICILLIN RESISTANT STAPHYLOCOCCUS AUREUS     Note: RIFAMPIN AND GENTAMICIN SHOULD NOT BE USED AS SINGLE DRUGS FOR TREATMENT OF STAPH INFECTIONS. CRITICAL RESULT CALLED TO, READ BACK BY AND VERIFIED WITH: STEPHANIE DILLON @ 8:31AM 11/21/13 BY DWEEKS     Performed at [facility]   Report Status 11/21/2013 FINAL   Final   Organism ID, Bacteria METHICILLIN RESISTANT STAPHYLOCOCCUS AUREUS   Final  CULTURE, BLOOD (ROUTINE X 2)     Status: None   Collection Time    11/21/13  9:50 AM      Result Value Ref Range Status   Specimen Description BLOOD LEFT HAND   Final   Special Requests BOTTLES DRAWN AEROBIC AND ANAEROBIC [facility] EACH   Final   Culture  Setup Time     Final   Value: 11/21/2013 13:23     Performed at [facility]  Final   Value:        BLOOD CULTURE RECEIVED NO GROWTH TO DATE CULTURE WILL BE HELD FOR 5 DAYS BEFORE ISSUING A FINAL NEGATIVE REPORT     Performed at [facility]   Report Status PENDING   Incomplete  CULTURE, BLOOD (ROUTINE X 2)     Status: None   Collection Time    11/21/13  9:50 AM      Result Value Ref Range Status   Specimen Description BLOOD RIGHT ARM   Final   Special Requests BOTTLES DRAWN AEROBIC AND ANAEROBIC 5 CC EACH   Final   Culture  Setup Time     Final   Value: 11/21/2013 13:22     Performed at [facility]   Culture     Final   Value:        BLOOD CULTURE RECEIVED NO GROWTH TO DATE CULTURE WILL BE HELD FOR 5 DAYS BEFORE ISSUING A FINAL NEGATIVE REPORT     Performed at [facility]   Report Status PENDING   Incomplete     Scheduled Meds: . sodium chloride   Intravenous Once  . antiseptic oral rinse  7 mL Mouth Rinse BID  . aspirin  325 mg Oral q morning - 10a  . aztreonam  1 g Intravenous Q8H  . buPROPion  300 mg Oral QAC breakfast  . calcium citrate  500 mg of elemental calcium Oral QID  . chlorhexidine  15 mL Mouth Rinse BID  . cholecalciferol  1,000 Units Oral  Daily  . diltiazem  120 mg Oral Daily  . enoxaparin (LOVENOX) injection  40 mg Subcutaneous Q24H  . estradiol  2 mg Oral QAC breakfast  . feeding supplement (ENSURE COMPLETE)  237 mL Oral TID BM  . linezolid  600 mg Intravenous Q12H  . metoprolol tartrate  50 mg Oral BID  . nicotine  14 mg Transdermal Daily  . pantoprazole (PROTONIX) IV  40 mg Intravenous Daily  . silver nitrate applicators  1 application Topical q morning - 10a   Continuous Infusions:   Faye Ramsay, MD  [facility] Pager [phone]  If 7PM-7AM, please contact night-coverage [email or web address] Password [email or web address] 11/24/2013, 12:00 PM   LOS: 6 days

## 2013-11-25 ENCOUNTER — Encounter (HOSPITAL_COMMUNITY)
Admission: EM | Disposition: A | Discharge status: Home / Self Care | Payer: Self-pay | Source: Home / Self Care | Attending: Internal Medicine

## 2013-11-25 ENCOUNTER — Encounter (HOSPITAL_COMMUNITY): Payer: Self-pay | Admitting: Gastroenterology

## 2013-11-25 DIAGNOSIS — D72829 Elevated white blood cell count, unspecified: Secondary | ICD-10-CM

## 2013-11-25 DIAGNOSIS — I359 Nonrheumatic aortic valve disorder, unspecified: Secondary | ICD-10-CM

## 2013-11-25 DIAGNOSIS — D638 Anemia in other chronic diseases classified elsewhere: Secondary | ICD-10-CM

## 2013-11-25 HISTORY — PX: TEE WITHOUT CARDIOVERSION: SHX5443

## 2013-11-25 LAB — CBC
HCT: 26.4 % — ABNORMAL LOW (ref 36.0–46.0)
Hemoglobin: 9.1 g/dL — ABNORMAL LOW (ref 12.0–15.0)
MCH: 31.6 pg (ref 26.0–34.0)
MCHC: 34.5 g/dL (ref 30.0–36.0)
MCV: 91.7 fL (ref 78.0–100.0)
PLATELETS: 182 10*3/uL (ref 150–400)
RBC: 2.88 MIL/uL — ABNORMAL LOW (ref 3.87–5.11)
RDW: 18.2 % — AB (ref 11.5–15.5)
WBC: 21.1 10*3/uL — ABNORMAL HIGH (ref 4.0–10.5)

## 2013-11-25 LAB — CULTURE, BLOOD (ROUTINE X 2)
Culture: NO GROWTH
Culture: NO GROWTH

## 2013-11-25 LAB — MAGNESIUM: Magnesium: 2.3 mg/dL (ref 1.5–2.5)

## 2013-11-25 LAB — BASIC METABOLIC PANEL
ANION GAP: 14 (ref 5–15)
BUN: 14 mg/dL (ref 6–23)
CALCIUM: 7 mg/dL — AB (ref 8.4–10.5)
CHLORIDE: 87 meq/L — AB (ref 96–112)
CO2: 25 mEq/L (ref 19–32)
CREATININE: 1.03 mg/dL (ref 0.50–1.10)
GFR calc Af Amer: 65 mL/min — ABNORMAL LOW (ref >90)
GFR calc non Af Amer: 56 mL/min — ABNORMAL LOW (ref >90)
Glucose, Bld: 116 mg/dL — ABNORMAL HIGH (ref 70–99)
Potassium: 4 mEq/L (ref 3.7–5.3)
Sodium: 126 mEq/L — ABNORMAL LOW (ref 137–147)

## 2013-11-25 SURGERY — ECHOCARDIOGRAM, TRANSESOPHAGEAL
Anesthesia: Moderate Sedation

## 2013-11-25 MED ORDER — FENTANYL CITRATE 0.05 MG/ML IJ SOLN
INTRAMUSCULAR | Status: DC | PRN
  Administered 2013-11-25: 25 ug via INTRAVENOUS

## 2013-11-25 MED ORDER — BUTAMBEN-TETRACAINE-BENZOCAINE 2-2-14 % EX AERO
INHALATION_SPRAY | CUTANEOUS | Status: DC | PRN
  Administered 2013-11-25: 2 via TOPICAL

## 2013-11-25 MED ORDER — MIDAZOLAM HCL 10 MG/2ML IJ SOLN
INTRAMUSCULAR | Status: DC | PRN
  Administered 2013-11-25 (×2): 1 mg via INTRAVENOUS
  Administered 2013-11-25: 2 mg via INTRAVENOUS

## 2013-11-25 MED ORDER — SODIUM CHLORIDE 0.9 % IV SOLN: INTRAVENOUS | Status: DC

## 2013-11-25 MED ORDER — SODIUM CHLORIDE 0.9 % IV BOLUS (SEPSIS)
500.0000 mL | Freq: Once | INTRAVENOUS | Status: AC
  Administered 2013-11-25: 500 mL via INTRAVENOUS

## 2013-11-25 MED ORDER — LINEZOLID 600 MG PO TABS
600.0000 mg | ORAL_TABLET | Freq: Two times a day (BID) | ORAL | Status: DC
  Administered 2013-11-26 – 2013-11-27 (×3): 600 mg via ORAL
  Filled 2013-11-25 (×5): qty 1

## 2013-11-25 NOTE — CV Procedure (Addendum)
   Transesophageal Echocardiogram Note  Mckenna Gamm [id number] February 15, 1951  Procedure: Transesophageal Echocardiogram Indications: Endocarditis, MRSA in urine, sepsis  Procedure Details Consent: Obtained Time Out: Verified patient identification, verified procedure, site/side was marked, verified correct patient position, special equipment/implants available, Radiology Safety Procedures followed,  medications/allergies/relevent history reviewed, required imaging and test results available.  Performed  Medications: Fentanyl: 25 mcg Versed: 4 mg  Left Ventrical:  The cavity size was normal. Systolic function was normal. The estimated ejection fraction was in the range of 60% to 65%. Wall motion was normal; there were no regional wall motion abnormalities.   Ascending aorta: The ascending aorta was mildly dilated measuring 41 mm.  Mitral Valve: Calcified annulus. Mild MR. No vegetation.   Aortic Valve: Trace AI, no vegetation.   Tricuspid Valve: Normal, no vegetation.   Pulmonic Valve: Grossly normal. No PR.   Left Atrium/ Left atrial appendage: No thrombus in LA or LAA.   Atrial septum: No PFO by color Doppler.   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    Complications: No apparent complications Patient did tolerate procedure well.  Dorothy Spark, MD, [facility] 11/25/2013, 11:04 AM

## 2013-11-25 NOTE — Progress Notes (Signed)
2nd guiac of stool is POSITIVE

## 2013-11-25 NOTE — Progress Notes (Signed)
Shift event: RN paged this NP secondary to pt being in Afib with RVR confirmed with EKG. Pt missed her Metoprolol and Cardizem CD this am in light of having a TEE to r/o endocarditis. She has a hx of PAF during this hospitalization and has been followed by cardiology. I asked RN to take pt's BP manually and page this NP again so that plan could be made for medication to be given for HR/Afib. RN paged back and pt's rhythm at that time was sinus tach with occasional PVC with HR in the one teen's and no longer in Afib. However, BP was in the 80s manually and pt was c/o epigastric discomfort, nausea, and her SaO2 was in the 70s. NP to bedside. Upon this NP's arrival... S: pt says she has epigastric pain, no chest pain. She has had this pain since admission, on and off, worse after trying to eat, which she did recently. She is also nauseated and has been given Zofran per RN. She has no SOB. States her O2 sat is always difficult to pick up due to her cold extremities and this has happened before. Says she is actually breathing better than before.  O: Appears chronically ill but not toxic. She is alert and oriented, pleasant and recognizes me from previous visit. VS reviewed. We tried several ways to obtain an O2 sat and were unsuccessful. Pt has no cyanosis nor increased effort breathing nor tachypnea. Card: S1S2 with murmur, occasional irregularity.  A/P: 1. Tachycardia with PAF-was doing fine on po Metoprolol and Cardizem CD until held today for TEE. Will follow schedule for those if BP holds. Will follow closely tonight. May need bolus NS. She is presently not in Afib.  2. Hypotension-decreased fluid intake today due to NPO for TEE. Consider bolus. Hold meds prn.   3. Hypoxia-believe this to be false as there are no clinical signs supporting this. RN to keep trying to get O2 sat with other equipment.  4. Epigastric pain/nausea-not new, continue prn meds.  Clance Boll, NP [facility]

## 2013-11-25 NOTE — Progress Notes (Signed)
Subjective:  No chest pain  Objective:   Vital Signs in the last 24 hours: Temp:  [97.6 F (36.4 C)-98.9 F (37.2 C)] 97.6 F (36.4 C) (08/10 0443) Pulse Rate:  [88-120] 88 (08/10 0443) Resp:  [20] 20 (08/10 0443) BP: (109-138)/(72-102) 109/76 mmHg (08/10 0443) SpO2:  [90 %-98 %] 93 % (08/10 0443) Weight:  [143 lb 8.3 oz (65.1 kg)] 143 lb 8.3 oz (65.1 kg) (08/10 0443)  Intake/Output from previous day: 08/09 0701 - 08/10 0700 In: 1920 [P.O.:720; I.V.:350; IV Piggyback:850] Out: 1025 [Urine:875; Stool:150]  Medications: . sodium chloride   Intravenous Once  . antiseptic oral rinse  7 mL Mouth Rinse BID  . aspirin  325 mg Oral q morning - 10a  . aztreonam  1 g Intravenous Q8H  . buPROPion  300 mg Oral QAC breakfast  . calcium citrate  500 mg of elemental calcium Oral QID  . chlorhexidine  15 mL Mouth Rinse BID  . cholecalciferol  1,000 Units Oral Daily  . diltiazem  120 mg Oral Daily  . enoxaparin (LOVENOX) injection  40 mg Subcutaneous Q24H  . estradiol  2 mg Oral QAC breakfast  . feeding supplement (ENSURE COMPLETE)  237 mL Oral TID BM  . linezolid  600 mg Intravenous Q12H  . metoprolol tartrate  50 mg Oral BID  . nicotine  14 mg Transdermal Daily  . pantoprazole (PROTONIX) IV  40 mg Intravenous Daily  . silver nitrate applicators  1 application Topical q morning - 10a       Physical Exam:   General appearance: alert, cooperative and no distress Neck: no adenopathy, no JVD, supple, symmetrical, trachea midline and thyroid not enlarged, symmetric, no tenderness/mass/nodules Lungs: slightly decreased BS Heart: regular rate and rhythm and 1-2 sem Abdomen: ileostomy; mildly tender Extremities: no edema, redness or tenderness in the calves or thighs Neurologic: Grossly normal   Rate: 70  Rhythm: normal sinus rhythm  Lab Results:   Recent Labs  11/24/13 0430 11/25/13 0420  NA 127* 126*  K 3.5* 4.0  CL 87* 87*  CO2 27 25  GLUCOSE 95 116*  BUN 10 14    CREATININE 0.75 1.03   CBC Latest Ref Rng 11/25/2013 11/22/2013 11/21/2013  WBC 4.0 - 10.5 K/uL 21.1(H) 9.7 7.6  Hemoglobin 12.0 - 15.0 g/dL 9.1(L) 8.6(L) 8.4(L)  Hematocrit 36.0 - 46.0 % 26.4(L) 25.7(L) 24.3(L)  Platelets 150 - 400 K/uL 182 185 154    No results found for this basename: TROPONINI, CK, MB,  in the last 72 hours  Hepatic Function Panel No results found for this basename: PROT, ALBUMIN, AST, ALT, ALKPHOS, BILITOT, BILIDIR, IBILI,  in the last 72 hours No results found for this basename: INR,  in the last 72 hours BNP (last 3 results)  Recent Labs  03/24/13 2031 03/25/13 0334 03/26/13 0414  PROBNP 9254.0* 9403.0* 4535.0*    Lipid Panel     Component Value Date/Time   CHOL 108 05/28/2012 0420   TRIG 105 05/28/2012 0420   HDL 92.50 12/15/2010 1144   CHOLHDL 2 12/15/2010 1144   VLDL 13.6 12/15/2010 1144   LDLCALC  Value: 115        Total Cholesterol/HDL:CHD Risk Coronary Heart Disease Risk Table                     Men   Women  1/2 Average Risk   3.4   3.3* 07/20/2007 0327      Imaging:  No  results found.    Assessment/Plan:   Principal Problem:   Acute respiratory distress Active Problems:   Dehydration   Hypotension   CMML (chronic myelomonocytic leukemia)   Open abdominal wall wound   Ileostomy status   Hypocalcemia   Anemia of other chronic disease   Metabolic acidosis   COPD (chronic obstructive pulmonary disease)   Anemia of chronic disease   UTI (lower urinary tract infection)   Weakness   UTI (urinary tract infection)   Severe sepsis   ARF (acute renal failure)   Urinary retention   CAP (community acquired pneumonia)   Leukocytosis, unspecified   Protein-calorie malnutrition, severe  1. PAF: converted to NSR 2. Leukocytosis with WBC inc to 21K today 3. Anemia 4. Hyponatremia 5. MSRA in urine  Maintaining NSR on cardizem 120 mg.  WBC inc to 21k from 9.7 3 days ago. For TEE today to assess for endocarditis.   Troy Sine, MD,  [facility] 11/25/2013, 9:07 AM

## 2013-11-25 NOTE — Care Management Note (Signed)
    Page 1 of 2   11/25/2013     2:27:38 PM CARE MANAGEMENT NOTE 11/25/2013  Patient:  [name], [name]   Account Number:  [id number]  Date Initiated:  11/19/2013  Documentation initiated by:  DAVIS,RHONDA  Subjective/Objective Assessment:   8/4 tx to icu shock, metabolic disarray     Action/Plan:   home when stable   Anticipated DC Date:  11/26/2013   Anticipated DC Plan:  HOME/SELF CARE  In-house referral  NA      DC Planning Services  CM consult      PAC Choice  NA   Choice offered to / List presented to:  NA   DME arranged  NA      DME agency  NA     [place] arranged  NA      [place] agency  NA   Status of service:  In process, will continue to follow Medicare Important Message given?  NA - LOS <3 / Initial given by admissions (If response is "NO", the following Medicare IM given date fields will be blank) Date Medicare IM given:   Medicare IM given by:   Date Additional Medicare IM given:   Additional Medicare IM given by:    Discharge Disposition:    Per UR Regulation:  Reviewed for med. necessity/level of care/duration of stay  If discussed at [place] of Stay Meetings, dates discussed:    Comments:  11/25/13 Taressa Rauh RN,BSN  NCM [phone] TEE.CARDIO FOLLOWING.D/C PLAN HOME.  [id number] Rosana Hoes, RN, BSN, CCM: [phone] Chart reviewed for discharge needs. None present at the time of review.  Patient is switched from iv carizem to p.o. route. Next chart review due on [id number].   [id number] Eldridge Dace, BSN, [place] [phone] Chart Reviewed for discharge and hospital needs. Discharge needs at time of review: None present will follow for needs. Review of patient progress due on [id number].

## 2013-11-25 NOTE — Progress Notes (Addendum)
[place] for Infectious Disease  Day #7 aztreonam Day #5 linezolid  The patient also received 3 days of levofloxacin  Subjective: Hungry now with better appetite after being n.p.o.   Antibiotics:  Anti-infectives   Start     Dose/Rate Route Frequency Ordered Stop   11/25/13 2000  linezolid (ZYVOX) tablet 600 mg     600 mg Oral Every 12 hours 11/25/13 1508     11/21/13 1000  linezolid (ZYVOX) IVPB 600 mg  Status:  Discontinued     600 mg 300 mL/hr over 60 Minutes Intravenous Every 12 hours 11/21/13 0902 11/25/13 1508   11/20/13 2200  levofloxacin (LEVAQUIN) IVPB 750 mg  Status:  Discontinued     750 mg 100 mL/hr over 90 Minutes Intravenous Every 48 hours 11/19/13 0425 11/21/13 0901   11/19/13 1400  aztreonam (AZACTAM) 1 g in dextrose 5 % 50 mL IVPB  Status:  Discontinued     1 g 100 mL/hr over 30 Minutes Intravenous Every 8 hours 11/19/13 0423 11/25/13 1508   11/19/13 0430  aztreonam (AZACTAM) 2 g in dextrose 5 % 50 mL IVPB  Status:  Discontinued     2 g 100 mL/hr over 30 Minutes Intravenous  Once 11/19/13 0423 11/20/13 1210   11/19/13 0015  levofloxacin (LEVAQUIN) IVPB 750 mg     750 mg 100 mL/hr over 90 Minutes Intravenous  Once 11/19/13 0000 11/19/13 0200   11/19/13 0015  aztreonam (AZACTAM) 2 g in dextrose 5 % 50 mL IVPB  Status:  Discontinued     2 g 100 mL/hr over 30 Minutes Intravenous  Once 11/19/13 0000 11/19/13 0132      Medications: Scheduled Meds: . sodium chloride   Intravenous Once  . antiseptic oral rinse  7 mL Mouth Rinse BID  . aspirin  325 mg Oral q morning - 10a  . buPROPion  300 mg Oral QAC breakfast  . calcium citrate  500 mg of elemental calcium Oral QID  . chlorhexidine  15 mL Mouth Rinse BID  . cholecalciferol  1,000 Units Oral Daily  . diltiazem  120 mg Oral Daily  . enoxaparin (LOVENOX) injection  40 mg Subcutaneous Q24H  . estradiol  2 mg Oral QAC breakfast  . feeding supplement (ENSURE COMPLETE)  237 mL Oral TID BM  . linezolid   600 mg Oral Q12H  . metoprolol tartrate  50 mg Oral BID  . nicotine  14 mg Transdermal Daily  . pantoprazole (PROTONIX) IV  40 mg Intravenous Daily  . silver nitrate applicators  1 application Topical q morning - 10a   Continuous Infusions: . sodium chloride     PRN Meds:.acetaminophen, HYDROmorphone (DILAUDID) injection, levalbuterol, magic mouthwash w/lidocaine, ondansetron (ZOFRAN) IV, prochlorperazine, promethazine, sodium chloride    Objective: Weight change: 3 lb 3.5 oz (1.46 kg)  Intake/Output Summary (Last 24 hours) at 11/25/13 1528 Last data filed at 11/25/13 1500  Gross per 24 hour  Intake   1500 ml  Output    600 ml  Net    900 ml   Blood pressure 99/66, pulse 85, temperature 98 F (36.7 C), temperature source Oral, resp. rate 20, height 5\' 5"  (1.651 m), weight 143 lb 8.3 oz (65.1 kg), SpO2 99.00%. Temp:  [97.6 F (36.4 C)-98.9 F (37.2 C)] 98 F (36.7 C) (08/10 1236) Pulse Rate:  [82-120] 85 (08/10 1236) Resp:  [13-26] 20 (08/10 1236) BP: (73-138)/(48-102) 99/66 mmHg (08/10 1236) SpO2:  [92 %-100 %] 99 % (08/10 1236)  Weight:  [143 lb 8.3 oz (65.1 kg)] 143 lb 8.3 oz (65.1 kg) (08/10 0443)  Physical Exam:  General: Alert and awake, oriented x3, not in any acute distress.  HEENT:EOMI, oropharynx clear and without exudate  CVS regular rate, normal r, no murmur rubs or gallops  Chest: clear to auscultation bilaterally, no wheezing, rales or rhonchi  Abdomen: soft nontender, nondistended, normal bowel sounds,  Extremities: no clubbing or edema noted bilaterally  Skin: no rashes  Neuro: nonfocal, strength and sensation intact  CBC:  CBC Latest Ref Rng 11/25/2013 11/22/2013 11/21/2013  WBC 4.0 - 10.5 K/uL 21.1(H) 9.7 7.6  Hemoglobin 12.0 - 15.0 g/dL 9.1(L) 8.6(L) 8.4(L)  Hematocrit 36.0 - 46.0 % 26.4(L) 25.7(L) 24.3(L)  Platelets 150 - 400 K/uL 182 185 154      BMET  Recent Labs  11/24/13 0430 11/25/13 0420  NA 127* 126*  K 3.5* 4.0  CL 87* 87*  CO2 27  25  GLUCOSE 95 116*  BUN 10 14  CREATININE 0.75 1.03  CALCIUM 6.3* 7.0*     Liver Panel  No results found for this basename: PROT, ALBUMIN, AST, ALT, ALKPHOS, BILITOT, BILIDIR, IBILI,  in the last 72 hours     Sedimentation Rate No results found for this basename: ESRSEDRATE,  in the last 72 hours C-Reactive Protein No results found for this basename: CRP,  in the last 72 hours  Micro Results: Recent Results (from the past 720 hour(s))  TECHNOLOGIST REVIEW     Status: None   Collection Time    11/05/13 11:39 AM      Result Value Ref Range Status   Technologist Review Rare NRBC and Rare Myelo   Final  CULTURE, BLOOD (ROUTINE X 2)     Status: None   Collection Time    11/18/13 11:25 PM      Result Value Ref Range Status   Specimen Description BLOOD L ARM   Final   Special Requests BOTTLES DRAWN AEROBIC AND ANAEROBIC 5CC   Final   Culture  Setup Time     Final   Value: 11/19/2013 03:53     Performed at [facility]   Culture     Final   Value: NO GROWTH 5 DAYS     Performed at [facility]   Report Status 11/25/2013 FINAL   Final  CULTURE, BLOOD (ROUTINE X 2)     Status: None   Collection Time    11/18/13 11:30 PM      Result Value Ref Range Status   Specimen Description BLOOD LEFT ANTECUBITAL   Final   Special Requests BOTTLES DRAWN AEROBIC AND ANAEROBIC 5CC   Final   Culture  Setup Time     Final   Value: 11/19/2013 03:54     Performed at [facility]   Culture     Final   Value: NO GROWTH 5 DAYS     Performed at [facility]   Report Status 11/25/2013 FINAL   Final  MRSA PCR SCREENING     Status: Abnormal   Collection Time    11/19/13  2:45 AM      Result Value Ref Range Status   MRSA by PCR POSITIVE (*) NEGATIVE Final   Comment:            The GeneXpert MRSA Assay (FDA     approved for NASAL specimens     only), is one component of a     comprehensive  MRSA colonization     surveillance program. It is not     intended to  diagnose MRSA     infection nor to guide or     monitor treatment for     MRSA infections.     RESULT CALLED TO, READ BACK BY AND VERIFIED WITH:     JOHNSON RN AT 0510 ON 08.04.15 BY SHUEA  URINE CULTURE     Status: None   Collection Time    11/19/13  8:49 AM      Result Value Ref Range Status   Specimen Description URINE, CATHETERIZED   Final   Special Requests NONE   Final   Culture  Setup Time     Final   Value: 11/19/2013 11:10     Performed at [place]     Final   Value: >=100,000 COLONIES/ML     Performed at [facility]   Culture     Final   Value: METHICILLIN RESISTANT STAPHYLOCOCCUS AUREUS     Note: RIFAMPIN AND GENTAMICIN SHOULD NOT BE USED AS SINGLE DRUGS FOR TREATMENT OF STAPH INFECTIONS. CRITICAL RESULT CALLED TO, READ BACK BY AND VERIFIED WITH: STEPHANIE DILLON @ 8:31AM 11/21/13 BY DWEEKS     Performed at [facility]   Report Status 11/21/2013 FINAL   Final   Organism ID, Bacteria METHICILLIN RESISTANT STAPHYLOCOCCUS AUREUS   Final  CULTURE, BLOOD (ROUTINE X 2)     Status: None   Collection Time    11/21/13  9:50 AM      Result Value Ref Range Status   Specimen Description BLOOD LEFT HAND   Final   Special Requests BOTTLES DRAWN AEROBIC AND ANAEROBIC [facility] EACH   Final   Culture  Setup Time     Final   Value: 11/21/2013 13:23     Performed at [facility]   Culture     Final   Value:        BLOOD CULTURE RECEIVED NO GROWTH TO DATE CULTURE WILL BE HELD FOR 5 DAYS BEFORE ISSUING A FINAL NEGATIVE REPORT     Performed at [facility]   Report Status PENDING   Incomplete  CULTURE, BLOOD (ROUTINE X 2)     Status: None   Collection Time    11/21/13  9:50 AM      Result Value Ref Range Status   Specimen Description BLOOD RIGHT ARM   Final   Special Requests BOTTLES DRAWN AEROBIC AND ANAEROBIC 5 CC EACH   Final   Culture  Setup Time     Final   Value: 11/21/2013 13:22     Performed at [facility]    Culture     Final   Value:        BLOOD CULTURE RECEIVED NO GROWTH TO DATE CULTURE WILL BE HELD FOR 5 DAYS BEFORE ISSUING A FINAL NEGATIVE REPORT     Performed at [facility]   Report Status PENDING   Incomplete    Studies/Results: No results found.    Assessment/Plan:  Principal Problem:   Acute respiratory distress Active Problems:   Dehydration   Hypotension   CMML (chronic myelomonocytic leukemia)   Open abdominal wall wound   Ileostomy status   Hypocalcemia   Anemia of other chronic disease   Metabolic acidosis   COPD (chronic obstructive pulmonary disease)   Anemia of chronic disease   UTI (lower urinary tract infection)   Weakness  UTI (urinary tract infection)   Severe sepsis   ARF (acute renal failure)   Urinary retention   CAP (community acquired pneumonia)   Leukocytosis, unspecified   Protein-calorie malnutrition, severe    [name] is a 63 y.o. female with CML receiving chemotherapy sp epogen admitted with SIRS, hypovolemia, found to have multiple pulmonary nodules, being rx for CAP (sans MRSA coverage) now with MRSA in urine cx but blood cultures negative   #1 MRSA in the urine: ID dictum is that MRSA in the urine is sign of overt or occult MRSA bacteremia that seeded the urine, outside of a few scenarios including those with indwelling catheters, improper collection of urine.  In this highly compromised patient and with pulnonary nodules I worry that she could have been having occult bacteremias and could even have right sided endocarditis with lung nodules representing possible septic emboli.  2 D echo without vegetations and TEE also fortunately without vegetations.  --We'll continue Zyvox but changed to oral Zyvox 600 mg twice daily --I would like to give her at least 2 weeks of zyvox, then extended with something like doxycycline for another 1-2 week   #2 Lung nodules: We'll discontinue her gram-negative coverage and as mentioned like  to extend to Zyvox for a total of 2 weeks followed by anti-MRSA therapy with either doxycycline or Bactrim for another 1-2 weeks after Zyvox completion. Patient will need repeat CT scan  #3 leukocytosis: Patient does have a diagnosis of CML. Will add CBC with differential tomorrow as well as perhaps this is a lowering of CML again.  IF she no longer has strong reason for central line would DC it   LOS: 7 days   Alcide Evener 11/25/2013, 3:28 PM

## 2013-11-25 NOTE — Progress Notes (Signed)
Echocardiogram 2D Echocardiogram has been performed.  [name] 11/25/2013, 11:41 AM

## 2013-11-25 NOTE — Progress Notes (Signed)
Patient ID: [name], female   DOB: Nov 29, 1950, 63 y.o.   MRN: [id number]  [facility] PROGRESS NOTE  [name] WSF:[id number] DOB: Apr 03, 1951 DOA: 11/18/2013 PCP: Darlin Coco, MD  Brief narrative:  Pt is 63 yo female with HTN CMML, with ileostomy , COPD, anemia of chronic disease, presented to [facility] ED with several days duration of progressively worsening nausea with no vomiting, poor oral intake. In ED, pt noted to be hypotensive with poor compensated metabolic acidosis (ph 0.1,VCB4 18, bicarb 6.8) along with renal failure Cr 2.23 (baseline Cr <1), CT of chest revealed multiple pulmonary nodules. PCCM asked to consult, place cvl and assist in managing her care.   Significant events since admission:  8/4 --> transferred to ICU, placed L IJ CVC  8/6 --> MRSA + urine culture  8/10 --> TEE  Principal Problem:  Acute hypoxic respiratory distress  - with pulmonary nodules c/w PNA, suspect HCAP but underlying malignancy not excluded  - pt also with COPD and ongoing tobacco abuse  - pt is clinically improving, denies chest pain and shortness of breath this AM but more productive cough noted on exam  - WBC trending down and WNL this AM, Tmax 98.7 F over the past 24 hours  - continue to provide supportive care with BD's, oxygen via   - needs repeat CT in 4-6 weeks to ensure clearance  - if nodularity persists, will need additional evaluation, with FOB  Active Problems:  Septic shock, from HCAP, MRSA UTI  - resolved, lactic acid is WNL, vital signs stable  - pt completed ABX Aztreonam and Levaquin for 4 days  - urine culture positive for MRSA (August 6th, 2015)  - changed ABX to Zyvox August 6th, 2015, continue Aztreonam day #8 - appreciate ID input  - plan for TEE today to rule out endocarditis  UTI  - MRSA isolated in urine  - continue Zyvox day #5 - TEE today   Leukocytosis - unclear etiology, pt looks better this AM and with no fevers over the past 24 hours but more cough  is noted on exam  - may need repeat CXR and repeat CBC Atrial fibrillation with RVR  - rate controlled, in NSR this AM  - continue Metoprolol, Cardizem PO added per cardiology (August 7th, 2105)  - pt determined not to be [facility] candidate per her primary cardiologist  ARF (acute renal failure)  - likely from hypovolemia and developing ATN  - Cr is improving and trending down, WNL this AM  Mixed AG and non-AG metabolic acidosis (delta ratio 0.4 on 8/4)  - Suspect due to high-output ileostomy and GI losses + acute renal failure  - resolved  Hypocalcemia  - continue to supplement and repeat BMP in AM  Hypokalemia with hypomagnesemia  - supplemented and WNL this AM - repeat BMP and Mg level in AM Hyponatremia - encourage PO intake as pt able to tolerate  - repeat BMP in AM Open abdominal wall wound  - with iliostomy in place and with drainage  Anemia of other chronic disease  - 10 --> 8.4 --> 6.7 (August 5th, 2015)  - transfused 2 U PRBC August 5th, 2015 with appropriate increase in Hg post transfusion (8.6)  - Hg remains stable over the past 24 hours  COPD (chronic obstructive pulmonary disease)  - continue BD's scheduled and as needed  - continue to provide nicotine patch  Protein-calorie malnutrition, severe  - advance diet as pt able to tolerate   Consultants:  PCCM  ID  Cardio Procedures/Studies:  CXR 11/18/2013 1.5 x 1.5 cm nodular opacity right lower lobe. No edema or consolidation.  CXR 11/20/2013 Bibasilar pneumonia without significant change since yesterday.  CXR 11/19/2013 Nodular infiltrates, c/w PNA.  CT chest 11/18/2013 Numerous upper and lower lobe nodular densities bilaterally, R>L, ? diffuse pneumonitis vsmetastatic Microbiology data:   Blood cultures 8/5 --> no growth  Urine culture 8/4 --> MRSA Blood culture 8/4 --> no growth  Antibiotics:  Aztreonam 8/3 -->  Levaquin 8/3 --> 8/6  Zyvox 8/6 -->  Code Status: Full  Family Communication: Pt at bedside  Disposition  Plan: Remains inpatient   HPI/Subjective: No events overnight. More productive cough of yellow sputum this AM.  Objective: Filed Vitals:   11/24/13 2100 11/24/13 2137 11/25/13 0006 11/25/13 0443  BP: 138/102  120/80 109/76  Pulse: 120  100 88  Temp: 98.9 F (37.2 C)  98.8 F (37.1 C) 97.6 F (36.4 C)  TempSrc: Oral  Oral Oral  Resp: 20  20 20   Height:      Weight:    65.1 kg (143 lb 8.3 oz)  SpO2: 92% 96% 98% 93%    Intake/Output Summary (Last 24 hours) at 11/25/13 0916 Last data filed at 11/25/13 0700  Gross per 24 hour  Intake   1560 ml  Output   1025 ml  Net    535 ml    Exam:   General:  Pt is alert, follows commands appropriately, not in acute distress  Cardiovascular: Regular rate and rhythm, no rubs, no gallops  Respiratory: Clear to auscultation bilaterally, rales at bases  Abdomen: Soft, non tender, non distended, bowel sounds present, no guarding  Extremities: No edema, pulses DP and PT palpable bilaterally  Neuro: Grossly nonfocal  Data Reviewed: Basic Metabolic Panel:  Recent Labs Lab 11/19/13 1000  11/20/13 0344 11/21/13 0530 11/22/13 0530 11/23/13 0440 11/24/13 0430 11/25/13 0420  NA  --   < > 134* 135* 135* 128* 127* 126*  K  --   < > 3.7 2.9* 3.5* 4.0 3.5* 4.0  CL  --   < > 100 95* 95* 89* 87* 87*  CO2  --   < > 18* 31 31 28 27 25   GLUCOSE  --   < > 165* 136* 118* 87 95 116*  BUN  --   < > 27* 13 9 9 10 14   CREATININE  --   < > 1.13* 0.77 0.76 0.78 0.75 1.03  CALCIUM 5.3*  < > 5.9* 5.9* 6.1* 6.1* 6.3* 7.0*  MG 0.7*  --  1.5  --   --  0.6* 1.3* 2.3  PHOS 4.2  --   --   --   --   --   --   --   < > = values in this interval not displayed. Liver Function Tests:  Recent Labs Lab 11/18/13 1945 11/19/13 1000 11/20/13 0344  AST 13 12 16   ALT 9 8 9   ALKPHOS 708* 588* 571*  BILITOT 0.4 0.4 0.4  PROT 8.0 6.7 5.8*  ALBUMIN 3.3* 2.7* 2.4*   CBC:  Recent Labs Lab 11/18/13 1945 11/19/13 0615 11/20/13 0344 11/21/13 0530  11/22/13 0530 11/25/13 0825  WBC 19.7* 14.9* 9.7 7.6 9.7 21.1*  NEUTROABS 13.0*  --  4.9 3.2  --   --   HGB 10.1* 8.4* 6.7* 8.4* 8.6* 9.1*  HCT 29.7* 25.1* 19.6* 24.3* 25.7* 26.4*  MCV 98.0 96.5 91.6 91.7 93.8 91.7  PLT 315  222 188 154 185 182   Cardiac Enzymes:  Recent Labs Lab 11/19/13 1000 11/19/13 1605 11/19/13 2200 11/20/13 1500  TROPONINI <0.30 <0.30 <0.30 <0.30   CBG:  Recent Labs Lab 11/20/13 0838  GLUCAP 175*   Scheduled Meds: . aspirin  325 mg Oral q morning - 10a  . aztreonam  1 g Intravenous Q8H  . buPROPion  300 mg Oral QAC breakfast  . calcium citrate 500 mg Oral QID  . cholecalciferol  1,000 Units Oral Daily  . diltiazem  120 mg Oral Daily  . enoxaparin  injection  40 mg Subcutaneous Q24H  . estradiol  2 mg Oral QAC breakfast  . linezolid  600 mg Intravenous Q12H  . metoprolol tartrate  50 mg Oral BID  . nicotine  14 mg Transdermal Daily  . pantoprazole IV  40 mg Intravenous Daily   Continuous Infusions:   Faye Ramsay, MD  [facility] Pager [phone]  If 7PM-7AM, please contact night-coverage [email or web address] Password [email or web address] 11/25/2013, 9:16 AM   LOS: 7 days

## 2013-11-26 ENCOUNTER — Encounter (HOSPITAL_COMMUNITY): Payer: Self-pay | Admitting: Cardiology

## 2013-11-26 DIAGNOSIS — R82998 Other abnormal findings in urine: Secondary | ICD-10-CM

## 2013-11-26 DIAGNOSIS — E871 Hypo-osmolality and hyponatremia: Secondary | ICD-10-CM

## 2013-11-26 LAB — CBC WITH DIFFERENTIAL/PLATELET
Basophils Absolute: 0 10*3/uL (ref 0.0–0.1)
Basophils Relative: 0 % (ref 0–1)
EOS ABS: 0 10*3/uL (ref 0.0–0.7)
Eosinophils Relative: 0 % (ref 0–5)
HEMATOCRIT: 24.9 % — AB (ref 36.0–46.0)
Hemoglobin: 8.6 g/dL — ABNORMAL LOW (ref 12.0–15.0)
LYMPHS ABS: 2.7 10*3/uL (ref 0.7–4.0)
Lymphocytes Relative: 16 % (ref 12–46)
MCH: 32.1 pg (ref 26.0–34.0)
MCHC: 34.5 g/dL (ref 30.0–36.0)
MCV: 92.9 fL (ref 78.0–100.0)
MONO ABS: 3.3 10*3/uL — AB (ref 0.1–1.0)
Monocytes Relative: 20 % — ABNORMAL HIGH (ref 3–12)
NEUTROS ABS: 10.6 10*3/uL — AB (ref 1.7–7.7)
Neutrophils Relative %: 64 % (ref 43–77)
Platelets: 181 10*3/uL (ref 150–400)
RBC: 2.68 MIL/uL — ABNORMAL LOW (ref 3.87–5.11)
RDW: 18.4 % — ABNORMAL HIGH (ref 11.5–15.5)
WBC: 16.6 10*3/uL — ABNORMAL HIGH (ref 4.0–10.5)

## 2013-11-26 LAB — BASIC METABOLIC PANEL
ANION GAP: 12 (ref 5–15)
BUN: 17 mg/dL (ref 6–23)
CALCIUM: 7 mg/dL — AB (ref 8.4–10.5)
CO2: 25 mEq/L (ref 19–32)
CREATININE: 1.08 mg/dL (ref 0.50–1.10)
Chloride: 87 mEq/L — ABNORMAL LOW (ref 96–112)
GFR calc non Af Amer: 53 mL/min — ABNORMAL LOW (ref >90)
GFR, EST AFRICAN AMERICAN: 62 mL/min — AB (ref >90)
Glucose, Bld: 110 mg/dL — ABNORMAL HIGH (ref 70–99)
Potassium: 3.8 mEq/L (ref 3.7–5.3)
Sodium: 124 mEq/L — ABNORMAL LOW (ref 137–147)

## 2013-11-26 MED ORDER — PANTOPRAZOLE SODIUM 40 MG PO TBEC
40.0000 mg | DELAYED_RELEASE_TABLET | Freq: Two times a day (BID) | ORAL | Status: DC
  Administered 2013-11-26 – 2013-11-27 (×2): 40 mg via ORAL
  Filled 2013-11-26 (×4): qty 1

## 2013-11-26 MED ORDER — PROMETHAZINE HCL 25 MG PO TABS: 25.0000 mg | ORAL_TABLET | ORAL | Status: DC | PRN

## 2013-11-26 MED ORDER — HYDROMORPHONE HCL 2 MG PO TABS
2.0000 mg | ORAL_TABLET | ORAL | Status: DC | PRN
  Administered 2013-11-26: 2 mg via ORAL
  Administered 2013-11-26 (×2): 4 mg via ORAL
  Administered 2013-11-27: 2 mg via ORAL
  Filled 2013-11-26 (×2): qty 1
  Filled 2013-11-26 (×2): qty 2

## 2013-11-26 MED ORDER — ONDANSETRON HCL 4 MG PO TABS
4.0000 mg | ORAL_TABLET | ORAL | Status: DC | PRN
  Administered 2013-11-26 – 2013-11-27 (×3): 4 mg via ORAL
  Filled 2013-11-26 (×3): qty 1

## 2013-11-26 NOTE — Progress Notes (Signed)
Subjective:  c/o GERD symptoms  Objective:   Vital Signs in the last 24 hours: Temp:  [97.9 F (36.6 C)-98.2 F (36.8 C)] 97.9 F (36.6 C) (08/11 0500) Pulse Rate:  [54-112] 60 (08/11 0559) Resp:  [13-26] 20 (08/11 0500) BP: (73-121)/(48-74) 98/64 mmHg (08/11 0500) SpO2:  [76 %-100 %] 92 % (08/11 0559) Weight:  [142 lb 1.6 oz (64.456 kg)] 142 lb 1.6 oz (64.456 kg) (08/11 0500)  Intake/Output from previous day: 08/10 0701 - 08/11 0700 In: 1760 [P.O.:720; I.V.:240; IV Piggyback:800] Out: 1300 [Urine:1000; Stool:300]  Medications: . sodium chloride   Intravenous Once  . antiseptic oral rinse  7 mL Mouth Rinse BID  . aspirin  325 mg Oral q morning - 10a  . buPROPion  300 mg Oral QAC breakfast  . calcium citrate  500 mg of elemental calcium Oral QID  . chlorhexidine  15 mL Mouth Rinse BID  . cholecalciferol  1,000 Units Oral Daily  . diltiazem  120 mg Oral Daily  . enoxaparin (LOVENOX) injection  40 mg Subcutaneous Q24H  . estradiol  2 mg Oral QAC breakfast  . feeding supplement (ENSURE COMPLETE)  237 mL Oral TID BM  . linezolid  600 mg Oral Q12H  . metoprolol tartrate  50 mg Oral BID  . nicotine  14 mg Transdermal Daily  . pantoprazole (PROTONIX) IV  40 mg Intravenous Daily  . silver nitrate applicators  1 application Topical q morning - 10a    . sodium chloride      Physical Exam:  General appearance: alert, cooperative and no distress  Neck: no adenopathy, no JVD, supple, symmetrical, trachea midline and thyroid not enlarged, symmetric, no tenderness/mass/nodules  Lungs:  decreased BS; no wheezing Heart: regular rate and rhythm and 1-2 sem  Abdomen: ileostomy; mildly tender  Extremities: no edema, redness or tenderness in the calves or thighs  Neurologic: Grossly normal      Rate: 95  Rhythm: normal sinus rhythm  Lab Results:   Recent Labs  11/25/13 0420 11/26/13 0450  NA 126* 124*  K 4.0 3.8  CL 87* 87*  CO2 25 25  GLUCOSE 116* 110*  BUN 14 17    CREATININE 1.03 1.08    No results found for this basename: TROPONINI, CK, MB,  in the last 72 hours  Hepatic Function Panel No results found for this basename: PROT, ALBUMIN, AST, ALT, ALKPHOS, BILITOT, BILIDIR, IBILI,  in the last 72 hours No results found for this basename: INR,  in the last 72 hours BNP (last 3 results)  Recent Labs  03/24/13 2031 03/25/13 0334 03/26/13 0414  PROBNP 9254.0* 9403.0* 4535.0*    Lipid Panel     Component Value Date/Time   CHOL 108 05/28/2012 0420   TRIG 105 05/28/2012 0420   HDL 92.50 12/15/2010 1144   CHOLHDL 2 12/15/2010 1144   VLDL 13.6 12/15/2010 1144   LDLCALC  Value: 115        Total Cholesterol/HDL:CHD Risk Coronary Heart Disease Risk Table                     Men   Women  1/2 Average Risk   3.4   3.3* 07/20/2007 0327      Imaging:   TEE: Left Ventrical: The cavity size was normal. Systolic function was normal. The estimated ejection fraction was in the range of 60% to 65%. Wall motion was normal; there were no regional wall motion abnormalities.  Ascending aorta: The  ascending aorta was mildly dilated measuring 41 mm. Mitral Valve: Calcified annulus. Mild MR. No vegetation.  Aortic Valve: Trace AI, no vegetation.  Tricuspid Valve: Normal, no vegetation.  Pulmonic Valve: Grossly normal. No PR.  Left Atrium/ Left atrial appendage: No thrombus in LA or LAA.  Atrial septum: No PFO by color Doppler.  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     Assessment/Plan:   Principal Problem:   Acute respiratory distress Active Problems:   Dehydration   Hypotension   CMML (chronic myelomonocytic leukemia)   Open abdominal wall wound   Ileostomy status   Hypocalcemia   Anemia of other chronic disease   Metabolic acidosis   COPD (chronic obstructive pulmonary disease)   Anemia of chronic disease   UTI (lower urinary tract infection)   Weakness   UTI (urinary tract infection)   Severe sepsis   ARF (acute  renal failure)   Urinary retention   CAP (community acquired pneumonia)   Leukocytosis, unspecified   Protein-calorie malnutrition, severe   Went into AF RVR yesterday after not receiving metoprolol/cardizem. Now back in sinus rhythm with rate ~ 90. BP remains low and precludes further titration of cardizem presently. Na is 124; need to free water restrict. ID assessment noted. No vegetations seen on TEE to account for lung nodules. GERD symptoms according to patient were better controlled with nexium rather than protonix.   Troy Sine, MD, [facility] 11/26/2013, 9:16 AM

## 2013-11-26 NOTE — Progress Notes (Signed)
Pt refuse to have the central line removed until she is going to be d/c'ed home. States she does not have any veins to start an IV.  Raquel Sarna RN notified of pt's requests and will contact the MD. Catalina Pizza

## 2013-11-26 NOTE — Progress Notes (Signed)
[place] for Infectious Disease  Day #6  linezolid   7 days aztreonam The patient also received 3 days of levofloxacin  Subjective: Doing better still coughing quite a bit   Antibiotics:  Anti-infectives   Start     Dose/Rate Route Frequency Ordered Stop   11/25/13 2000  linezolid (ZYVOX) tablet 600 mg     600 mg Oral Every 12 hours 11/25/13 1508     11/21/13 1000  linezolid (ZYVOX) IVPB 600 mg  Status:  Discontinued     600 mg 300 mL/hr over 60 Minutes Intravenous Every 12 hours 11/21/13 0902 11/25/13 1508   11/20/13 2200  levofloxacin (LEVAQUIN) IVPB 750 mg  Status:  Discontinued     750 mg 100 mL/hr over 90 Minutes Intravenous Every 48 hours 11/19/13 0425 11/21/13 0901   11/19/13 1400  aztreonam (AZACTAM) 1 g in dextrose 5 % 50 mL IVPB  Status:  Discontinued     1 g 100 mL/hr over 30 Minutes Intravenous Every 8 hours 11/19/13 0423 11/25/13 1508   11/19/13 0430  aztreonam (AZACTAM) 2 g in dextrose 5 % 50 mL IVPB  Status:  Discontinued     2 g 100 mL/hr over 30 Minutes Intravenous  Once 11/19/13 0423 11/20/13 1210   11/19/13 0015  levofloxacin (LEVAQUIN) IVPB 750 mg     750 mg 100 mL/hr over 90 Minutes Intravenous  Once 11/19/13 0000 11/19/13 0200   11/19/13 0015  aztreonam (AZACTAM) 2 g in dextrose 5 % 50 mL IVPB  Status:  Discontinued     2 g 100 mL/hr over 30 Minutes Intravenous  Once 11/19/13 0000 11/19/13 0132      Medications: Scheduled Meds: . sodium chloride   Intravenous Once  . antiseptic oral rinse  7 mL Mouth Rinse BID  . aspirin  325 mg Oral q morning - 10a  . buPROPion  300 mg Oral QAC breakfast  . calcium citrate  500 mg of elemental calcium Oral QID  . chlorhexidine  15 mL Mouth Rinse BID  . cholecalciferol  1,000 Units Oral Daily  . diltiazem  120 mg Oral Daily  . enoxaparin (LOVENOX) injection  40 mg Subcutaneous Q24H  . estradiol  2 mg Oral QAC breakfast  . feeding supplement (ENSURE COMPLETE)  237 mL Oral TID BM  . linezolid  600 mg  Oral Q12H  . metoprolol tartrate  50 mg Oral BID  . nicotine  14 mg Transdermal Daily  . pantoprazole  40 mg Oral BID AC  . silver nitrate applicators  1 application Topical q morning - 10a   Continuous Infusions:   PRN Meds:.acetaminophen, HYDROmorphone, levalbuterol, magic mouthwash w/lidocaine, ondansetron, promethazine, sodium chloride    Objective: Weight change: -1 lb 6.7 oz (-0.644 kg)  Intake/Output Summary (Last 24 hours) at 11/26/13 1321 Last data filed at 11/26/13 0631  Gross per 24 hour  Intake   1760 ml  Output   1300 ml  Net    460 ml   Blood pressure 98/64, pulse 64, temperature 97.9 F (36.6 C), temperature source Axillary, resp. rate 20, height 5\' 5"  (1.651 m), weight 142 lb 1.6 oz (64.456 kg), SpO2 92.00%. Temp:  [97.9 F (36.6 C)] 97.9 F (36.6 C) (08/11 0500) Pulse Rate:  [54-112] 64 (08/11 0929) Resp:  [16-20] 20 (08/11 0500) BP: (73-109)/(50-74) 98/64 mmHg (08/11 0500) SpO2:  [76 %-100 %] 92 % (08/11 0559) Weight:  [142 lb 1.6 oz (64.456 kg)] 142 lb 1.6 oz (64.456 kg) (  08/11 0500)  Physical Exam:  General: Alert and awake, oriented x3, not in any acute distress.  HEENT:EOMI, oropharynx clear and without exudate  CVS regular rate, normal r, no murmur rubs or gallops  Chest rhonchi Abdomen: soft nontender, nondistended, normal bowel sounds,  Extremities: no clubbing or edema noted bilaterally  Skin: no rashes  Neuro: nonfocal, strength and sensation intact  CBC:  CBC Latest Ref Rng 11/26/2013 11/25/2013 11/22/2013  WBC 4.0 - 10.5 K/uL 16.6(H) 21.1(H) 9.7  Hemoglobin 12.0 - 15.0 g/dL 8.6(L) 9.1(L) 8.6(L)  Hematocrit 36.0 - 46.0 % 24.9(L) 26.4(L) 25.7(L)  Platelets 150 - 400 K/uL 181 182 185      BMET  Recent Labs  11/25/13 0420 11/26/13 0450  NA 126* 124*  K 4.0 3.8  CL 87* 87*  CO2 25 25  GLUCOSE 116* 110*  BUN 14 17  CREATININE 1.03 1.08  CALCIUM 7.0* 7.0*     Liver Panel  No results found for this basename: PROT, ALBUMIN,  AST, ALT, ALKPHOS, BILITOT, BILIDIR, IBILI,  in the last 72 hours     Sedimentation Rate No results found for this basename: ESRSEDRATE,  in the last 72 hours C-Reactive Protein No results found for this basename: CRP,  in the last 72 hours  Micro Results: Recent Results (from the past 720 hour(s))  TECHNOLOGIST REVIEW     Status: None   Collection Time    11/05/13 11:39 AM      Result Value Ref Range Status   Technologist Review Rare NRBC and Rare Myelo   Final  CULTURE, BLOOD (ROUTINE X 2)     Status: None   Collection Time    11/18/13 11:25 PM      Result Value Ref Range Status   Specimen Description BLOOD L ARM   Final   Special Requests BOTTLES DRAWN AEROBIC AND ANAEROBIC 5CC   Final   Culture  Setup Time     Final   Value: 11/19/2013 03:53     Performed at [facility]   Culture     Final   Value: NO GROWTH 5 DAYS     Performed at [facility]   Report Status 11/25/2013 FINAL   Final  CULTURE, BLOOD (ROUTINE X 2)     Status: None   Collection Time    11/18/13 11:30 PM      Result Value Ref Range Status   Specimen Description BLOOD LEFT ANTECUBITAL   Final   Special Requests BOTTLES DRAWN AEROBIC AND ANAEROBIC 5CC   Final   Culture  Setup Time     Final   Value: 11/19/2013 03:54     Performed at [facility]   Culture     Final   Value: NO GROWTH 5 DAYS     Performed at [facility]   Report Status 11/25/2013 FINAL   Final  MRSA PCR SCREENING     Status: Abnormal   Collection Time    11/19/13  2:45 AM      Result Value Ref Range Status   MRSA by PCR POSITIVE (*) NEGATIVE Final   Comment:            The GeneXpert MRSA Assay (FDA     approved for NASAL specimens     only), is one component of a     comprehensive MRSA colonization     surveillance program. It is not     intended to diagnose MRSA  infection nor to guide or     monitor treatment for     MRSA infections.     RESULT CALLED TO, READ BACK BY AND VERIFIED WITH:       JOHNSON RN AT 0510 ON 08.04.15 BY SHUEA  URINE CULTURE     Status: None   Collection Time    11/19/13  8:49 AM      Result Value Ref Range Status   Specimen Description URINE, CATHETERIZED   Final   Special Requests NONE   Final   Culture  Setup Time     Final   Value: 11/19/2013 11:10     Performed at [facility]     Final   Value: >=100,000 COLONIES/ML     Performed at [facility]   Culture     Final   Value: METHICILLIN RESISTANT STAPHYLOCOCCUS AUREUS     Note: RIFAMPIN AND GENTAMICIN SHOULD NOT BE USED AS SINGLE DRUGS FOR TREATMENT OF STAPH INFECTIONS. CRITICAL RESULT CALLED TO, READ BACK BY AND VERIFIED WITH: STEPHANIE DILLON @ 8:31AM 11/21/13 BY DWEEKS     Performed at [facility]   Report Status 11/21/2013 FINAL   Final   Organism ID, Bacteria METHICILLIN RESISTANT STAPHYLOCOCCUS AUREUS   Final  CULTURE, BLOOD (ROUTINE X 2)     Status: None   Collection Time    11/21/13  9:50 AM      Result Value Ref Range Status   Specimen Description BLOOD LEFT HAND   Final   Special Requests BOTTLES DRAWN AEROBIC AND ANAEROBIC [facility] EACH   Final   Culture  Setup Time     Final   Value: 11/21/2013 13:23     Performed at [facility]   Culture     Final   Value:        BLOOD CULTURE RECEIVED NO GROWTH TO DATE CULTURE WILL BE HELD FOR 5 DAYS BEFORE ISSUING A FINAL NEGATIVE REPORT     Performed at [facility]   Report Status PENDING   Incomplete  CULTURE, BLOOD (ROUTINE X 2)     Status: None   Collection Time    11/21/13  9:50 AM      Result Value Ref Range Status   Specimen Description BLOOD RIGHT ARM   Final   Special Requests BOTTLES DRAWN AEROBIC AND ANAEROBIC 5 CC EACH   Final   Culture  Setup Time     Final   Value: 11/21/2013 13:22     Performed at [facility]   Culture     Final   Value:        BLOOD CULTURE RECEIVED NO GROWTH TO DATE CULTURE WILL BE HELD FOR 5 DAYS BEFORE ISSUING A FINAL NEGATIVE REPORT      Performed at [facility]   Report Status PENDING   Incomplete    Studies/Results: No results found.    Assessment/Plan:  Principal Problem:   Acute respiratory distress Active Problems:   Dehydration   Hypotension   CMML (chronic myelomonocytic leukemia)   Open abdominal wall wound   Ileostomy status   Hypocalcemia   Anemia of other chronic disease   Metabolic acidosis   COPD (chronic obstructive pulmonary disease)   Anemia of chronic disease   UTI (lower urinary tract infection)   Weakness   UTI (urinary tract infection)   Severe sepsis   ARF (acute renal failure)   Urinary retention  CAP (community acquired pneumonia)   Leukocytosis, unspecified   Protein-calorie malnutrition, severe    [name] is a 63 y.o. female with CML receiving chemotherapy sp epogen admitted with SIRS, hypovolemia, found to have multiple pulmonary nodules, being rx for CAP (sans MRSA coverage) now with MRSA in urine cx but blood cultures negative   #1 MRSA in the urine: ID dictum is that MRSA in the urine is sign of overt or occult MRSA bacteremia that seeded the urine, outside of a few scenarios including those with indwelling catheters, improper collection of urine.  In this highly compromised patient and with pulnonary nodules I worry that she could have been having occult bacteremias and could even have right sided endocarditis with lung nodules representing possible septic emboli.  2 D echo without vegetations and TEE also fortunately without vegetations.  --We'll continue Zyvox but changed to oral Zyvox 600 mg twice daily --I would like to give her at least 2 weeks of zyvox, then extend for another 2 weeks after that with doxycycline 100 mg bid For 4 weeks total anti-MRSA activity   #2 Lung nodules:  as mentioned like to extend to Zyvox for a total of 2 weeks followed by anti-MRSA therapy with either doxycycline or Bactrim for another 2 weeks after Zyvox completion.    Patient will need repeat CT scan and followup with Pulmonary  #3 leukocytosis: Patient does have a diagnosis of CML. Slightly partly contributes to her leukocytosis although she also has a quite a few neutrophils. In any case her leukocytosis is improving.   IF she no longer has strong reason for central line would DC   I will sign off for now  Please call if further quesitons and we would be happy to see the patient in followup in the next 4-5 weeks   LOS: 8 days   Alcide Evener 11/26/2013, 1:21 PM

## 2013-11-26 NOTE — Discharge Instructions (Signed)

## 2013-11-26 NOTE — Evaluation (Signed)
Physical Therapy Evaluation Patient Details Name: Aaleyah Witherow MRN: [id number] DOB: 10-03-1950 Today's Date: 11/26/2013   History of Present Illness  63 yo female with HTN, CMML, ileostomy , COPD, anemia of chronic disease, presented to ED with several days duration of progressively worsening nausea with no vomiting, poor oral intake; admitted for acute respiratory distress and septic shock.  Clinical Impression  Pt admitted with above. Pt currently with functional limitations due to the deficits listed below (see PT Problem List).  Pt will benefit from skilled PT to increase their independence and safety with mobility to allow discharge to the venue listed below.  Pt reports fatigue and dizziness today which limited mobility, however reports daughter to assist at home.  Please see below regarding oxygen saturations.  SATURATION QUALIFICATIONS: (This note is used to comply with regulatory documentation for home oxygen)  Patient Saturations on Room Air at Rest = 77%  Patient Saturations on Room Air while Ambulating = N/A  Patient Saturations on 2 Liters of oxygen while Ambulating = 83%  Please briefly explain why patient needs home oxygen: to assist with keeping oxygen saturations above 88%  Difficult to get reading for saturation due to cold hands, used ear lobe.  Pt SpO2 improved to 95% on 2L after reapplying oxygen prior to ambulating.     Follow Up Recommendations Home health PT;Supervision for mobility/OOB    Equipment Recommendations  None recommended by PT    Recommendations for Other Services       Precautions / Restrictions Precautions Precautions: Fall Precaution Comments: reports hx of low BP, monitor vitals      Mobility  Bed Mobility Overal bed mobility: Modified Independent             General bed mobility comments: increased time  Transfers Overall transfer level: Needs assistance   Transfers: Sit to/from Stand Sit to Stand: Min guard          General transfer comment: verbal cues to watch for lines, min/guard for safety, pt reports hx of low BP/dizziness with mobility  Ambulation/Gait Ambulation/Gait assistance: Min assist Ambulation Distance (Feet): 50 Feet Assistive device:  (IV pole bil UE support) Gait Pattern/deviations: Step-through pattern;Decreased stride length Gait velocity: decr   General Gait Details: pt used IV pole for UE support, declined RW today, upon standing felt fatigued and requested a few steps over to recliner to rest prior to continuing with ambulation, pt then assisted with further ambulation however reported dizziness so assisted back to recliner and checked BP: 89/63 mmHg and SpO2 83% on 2L so had pt perform a few deep breaths however unable to pick up sats again (cold hands, nsg has been using her ear)  Stairs            Wheelchair Mobility    Modified Rankin (Stroke Patients Only)       Balance                                             Pertinent Vitals/Pain Pain Assessment: 0-10 Pain Score: 1  Pain Location: abdomen Pain Descriptors / Indicators: Discomfort Pain Intervention(s): Patient requesting pain meds-RN notified;Repositioned    Home Living Family/patient expects to be discharged to:: Private residence Living Arrangements: Children Available Help at Discharge: Family Type of Home: House Home Access: Stairs to enter   Technical brewer of Steps: 4 Home Layout: One level Home  Equipment: Gilford Rile - 2 wheels;Cane - single point      Prior Function Level of Independence: Independent               Hand Dominance        Extremity/Trunk Assessment               Lower Extremity Assessment: Generalized weakness         Communication   Communication: No difficulties  Cognition Arousal/Alertness: Awake/alert Behavior During Therapy: WFL for tasks assessed/performed Overall Cognitive Status: Within Functional Limits for tasks  assessed                      General Comments      Exercises        Assessment/Plan    PT Assessment Patient needs continued PT services  PT Diagnosis Difficulty walking   PT Problem List Decreased strength;Decreased activity tolerance;Decreased mobility;Decreased balance;Decreased knowledge of use of DME;Cardiopulmonary status limiting activity  PT Treatment Interventions Gait training;DME instruction;Patient/family education;Functional mobility training;Therapeutic activities;Therapeutic exercise;Stair training   PT Goals (Current goals can be found in the Care Plan section) Acute Rehab PT Goals PT Goal Formulation: With patient Time For Goal Achievement: 12/03/13 Potential to Achieve Goals: Good    Frequency Min 3X/week   Barriers to discharge        Co-evaluation               End of Session Equipment Utilized During Treatment: Oxygen Activity Tolerance: Patient limited by fatigue Patient left: in chair;with call bell/phone within reach Nurse Communication: Mobility status;Patient requests pain meds         Time: 2010-0712 PT Time Calculation (min): 37 min   Charges:   PT Evaluation $Initial PT Evaluation Tier I: 1 Procedure PT Treatments $Gait Training: 23-37 mins   PT G Codes:          Sukaina Toothaker,KATHrine E 11/26/2013, 4:04 PM Carmelia Bake, PT, DPT 11/26/2013 Pager: [phone]

## 2013-11-26 NOTE — Progress Notes (Addendum)
Patient ID: [name], female   DOB: [birth year], 62 y.o.   MRN: [id number]  [facility] PROGRESS NOTE  [name] QAS:[id number] DOB: [name] DOA: 11/18/2013 PCP: Kari Coco, MD  Brief narrative:  Pt is 63 yo female with HTN CMML, with ileostomy , COPD, anemia of chronic disease, presented to [facility] ED with several days duration of progressively worsening nausea with no vomiting, poor oral intake. In ED, pt noted to be hypotensive with poor compensated metabolic acidosis (ph 7.9,GXQ1 18, bicarb 6.8) along with renal failure Cr 2.23 (baseline Cr <1), CT of chest revealed multiple pulmonary nodules. PCCM asked to consult, place cvl and assist in managing her care.   Significant events since admission:  8/4 --> transferred to ICU, placed L IJ CVC  8/6 --> MRSA + urine culture  8/7 --> went in atrial fibrillation requiring metoprolol  8/10 --> TEE negative for vegetations   Principal Problem:  Acute hypoxic respiratory distress  - with pulmonary nodules c/w PNA, suspect HCAP but underlying malignancy not excluded  - pt also with COPD and ongoing tobacco abuse  - pt is clinically improving, denies chest pain and shortness of breath this AM  - WBC trending down, Tmax 98.7 F over the past 48 hours  - continue to provide supportive care with BD's, oxygen via   - needs repeat CT in 4-6 weeks to ensure clearance, pt and her daughter both made aware - will place information under the follow up section  - if nodularity persists, will need additional evaluation, with FOB  Active Problems:  Septic shock, from HCAP, MRSA UTI  - resolved, lactic acid is WNL, vital signs stable  - pt completed ABX Levaquin for 4 days  - urine culture positive for MRSA (August 6th, 2015)  - changed ABX to Zyvox August 6th, 2015, continued Aztreonam for 8 days, stopped august 10th, 2015 per ID - appreciate ID input  - no endocarditis on TEE - pt transitioned to oral Zyvox to complete tx for 2 weeks with  transition to oral doxy or bactrim for 2 more weeks after Zyvox, per ID UTI  - MRSA isolated in urine  - continue Zyvox day #6, transitioned to oral Zyvox 600 mg BID for 2 more weeks post discharge with continuation of ABX with doxy or bactrim for 2 additional weeks after Zyvox treatment, per ID recommendations  - TEE today  Leukocytosis  - unclear etiology, pt looks better this AM and WBC trending down - she has history of CML Atrial fibrillation with RVR  - rate controlled, in NSR this AM  - went into a-fib last night August 10th, 2015 but in NSR this AM - continue Metoprolol, Cardizem PO added per cardiology (August 7th, 2105)  - pt determined not to be [facility] candidate per her primary cardiologist  ARF (acute renal failure)  - likely from hypovolemia and developing ATN  - Cr is improving and trending down, WNL this AM  Mixed AG and non-AG metabolic acidosis (delta ratio 0.4 on 8/4)  - Suspect due to high-output ileostomy and GI losses + acute renal failure  - resolved  Hypocalcemia  - continue to supplement and repeat BMP in AM  Hypokalemia with hypomagnesemia  - supplemented and WNL this AM  - repeat BMP and Mg level in AM  Hyponatremia  - encourage PO intake as pt able to tolerate  - repeat BMP in AM  Open abdominal wall wound  - with iliostomy in place and with drainage  Anemia of other chronic disease  - 10 --> 8.4 --> 6.7 (August 5th, 2015)  - transfused 2 U PRBC August 5th, 2015 with appropriate increase in Hg post transfusion (8.6)  - Hg slightly down over the past 24 hours so monitor closely and repeat CBC in AM Hyponatremia - Na down over the past 24 hours - pt is clinically stable, repeat BMP in AM COPD (chronic obstructive pulmonary disease)  - continue BD's scheduled and as needed  - continue to provide nicotine patch  Protein-calorie malnutrition, severe  - advance diet as pt able to tolerate  DVT prophylaxis: Lovenox SQ  Consultants:  PCCM --> signed off, pt  made aware she needs to follow up with PCCM in 4-6 weeks post discharge  ID  Cardio Procedures/Studies:  CXR 11/18/2013 1.5 x 1.5 cm nodular opacity right lower lobe. No edema or consolidation.  CXR 11/20/2013 Bibasilar pneumonia without significant change since yesterday.  CXR 11/19/2013 Nodular infiltrates, c/w PNA.  CT chest 11/18/2013 Numerous upper and lower lobe nodular densities bilaterally, R>L, ? diffuse pneumonitis vs metastatic dz TEE 8/10 Negative for vegetations  Microbiology data:  Blood cultures 8/5 --> no growth  Urine culture 8/4 --> MRSA  Blood culture 8/4 --> no growth  Antibiotics:  Aztreonam 8/3 --> 8/10 Levaquin 8/3 --> 8/6  Zyvox 8/6 -->  Code Status: Full  Family Communication: Pt and daughter at bedside  Disposition Plan: Possible d/c in AM if no a-fib and electrolytes stable and if tolerating PO, all meds now changed to PO and central line to be removed today August 11th, 2015   HPI/Subjective: No events overnight.   Objective: Filed Vitals:   11/25/13 2311 11/26/13 0500 11/26/13 0559 11/26/13 0929  BP: 109/74 98/64    Pulse:  54 60 64  Temp:  97.9 F (36.6 C)    TempSrc:  Axillary    Resp:  20    Height:      Weight:  64.456 kg (142 lb 1.6 oz)    SpO2:  88% 92%     Intake/Output Summary (Last 24 hours) at 11/26/13 0941 Last data filed at 11/26/13 0631  Gross per 24 hour  Intake   1760 ml  Output   1300 ml  Net    460 ml    Exam:   General:  Pt is alert, follows commands appropriately, not in acute distress  Cardiovascular: Regular rate and rhythm, S1/S2, no murmurs, no rubs, no gallops  Respiratory: Clear to auscultation bilaterally, no wheezing,   Abdomen: Soft, non tender, non distended, bowel sounds present, no guarding  Extremities: No edema, pulses DP and PT palpable bilaterally   Data Reviewed: Basic Metabolic Panel:  Recent Labs Lab 11/19/13 1000  11/20/13 0344  11/22/13 0530 11/23/13 0440 11/24/13 0430 11/25/13 0420  11/26/13 0450  NA  --   < > 134*  < > 135* 128* 127* 126* 124*  K  --   < > 3.7  < > 3.5* 4.0 3.5* 4.0 3.8  CL  --   < > 100  < > 95* 89* 87* 87* 87*  CO2  --   < > 18*  < > 31 28 27 25 25   GLUCOSE  --   < > 165*  < > 118* 87 95 116* 110*  BUN  --   < > 27*  < > 9 9 10 14 17   CREATININE  --   < > 1.13*  < > 0.76 0.78 0.75 1.03  1.08  CALCIUM 5.3*  < > 5.9*  < > 6.1* 6.1* 6.3* 7.0* 7.0*  MG 0.7*  --  1.5  --   --  0.6* 1.3* 2.3  --   PHOS 4.2  --   --   --   --   --   --   --   --   < > = values in this interval not displayed. Liver Function Tests:  Recent Labs Lab 11/19/13 1000 11/20/13 0344  AST 12 16  ALT 8 9  ALKPHOS 588* 571*  BILITOT 0.4 0.4  PROT 6.7 5.8*  ALBUMIN 2.7* 2.4*   CBC:  Recent Labs Lab 11/20/13 0344 11/21/13 0530 11/22/13 0530 11/25/13 0825 11/26/13 0450  WBC 9.7 7.6 9.7 21.1* 16.6*  NEUTROABS 4.9 3.2  --   --  10.6*  HGB 6.7* 8.4* 8.6* 9.1* 8.6*  HCT 19.6* 24.3* 25.7* 26.4* 24.9*  MCV 91.6 91.7 93.8 91.7 92.9  PLT 188 154 185 182 181   Cardiac Enzymes:  Recent Labs Lab 11/19/13 1000 11/19/13 1605 11/19/13 2200 11/20/13 1500  TROPONINI <0.30 <0.30 <0.30 <0.30   CBG:  Recent Labs Lab 11/20/13 0838  GLUCAP 175*    Recent Results (from the past 240 hour(s))  CULTURE, BLOOD (ROUTINE X 2)     Status: None   Collection Time    11/18/13 11:25 PM      Result Value Ref Range Status   Specimen Description BLOOD L ARM   Final   Special Requests BOTTLES DRAWN AEROBIC AND ANAEROBIC 5CC   Final   Culture  Setup Time     Final   Value: 11/19/2013 03:53     Performed at [facility]   Culture     Final   Value: NO GROWTH 5 DAYS     Performed at [facility]   Report Status 11/25/2013 FINAL   Final  CULTURE, BLOOD (ROUTINE X 2)     Status: None   Collection Time    11/18/13 11:30 PM      Result Value Ref Range Status   Specimen Description BLOOD LEFT ANTECUBITAL   Final   Special Requests BOTTLES DRAWN AEROBIC AND  ANAEROBIC 5CC   Final   Culture  Setup Time     Final   Value: 11/19/2013 03:54     Performed at [facility]   Culture     Final   Value: NO GROWTH 5 DAYS     Performed at [facility]   Report Status 11/25/2013 FINAL   Final  MRSA PCR SCREENING     Status: Abnormal   Collection Time    11/19/13  2:45 AM      Result Value Ref Range Status   MRSA by PCR POSITIVE (*) NEGATIVE Final   Comment:            The GeneXpert MRSA Assay (FDA     approved for NASAL specimens     only), is one component of a     comprehensive MRSA colonization     surveillance program. It is not     intended to diagnose MRSA     infection nor to guide or     monitor treatment for     MRSA infections.     RESULT CALLED TO, READ BACK BY AND VERIFIED WITH:     JOHNSON RN AT 0510 ON 08.04.15 BY SHUEA  URINE CULTURE     Status: None  Collection Time    11/19/13  8:49 AM      Result Value Ref Range Status   Specimen Description URINE, CATHETERIZED   Final   Special Requests NONE   Final   Culture  Setup Time     Final   Value: 11/19/2013 11:10     Performed at [place]     Final   Value: >=100,000 COLONIES/ML     Performed at [facility]   Culture     Final   Value: METHICILLIN RESISTANT STAPHYLOCOCCUS AUREUS     Note: RIFAMPIN AND GENTAMICIN SHOULD NOT BE USED AS SINGLE DRUGS FOR TREATMENT OF STAPH INFECTIONS. CRITICAL RESULT CALLED TO, READ BACK BY AND VERIFIED WITH: STEPHANIE DILLON @ 8:31AM 11/21/13 BY DWEEKS     Performed at [facility]   Report Status 11/21/2013 FINAL   Final   Organism ID, Bacteria METHICILLIN RESISTANT STAPHYLOCOCCUS AUREUS   Final  CULTURE, BLOOD (ROUTINE X 2)     Status: None   Collection Time    11/21/13  9:50 AM      Result Value Ref Range Status   Specimen Description BLOOD LEFT HAND   Final   Special Requests BOTTLES DRAWN AEROBIC AND ANAEROBIC [facility] EACH   Final   Culture  Setup Time     Final   Value: 11/21/2013  13:23     Performed at [facility]   Culture     Final   Value:        BLOOD CULTURE RECEIVED NO GROWTH TO DATE CULTURE WILL BE HELD FOR 5 DAYS BEFORE ISSUING A FINAL NEGATIVE REPORT     Performed at [facility]   Report Status PENDING   Incomplete  CULTURE, BLOOD (ROUTINE X 2)     Status: None   Collection Time    11/21/13  9:50 AM      Result Value Ref Range Status   Specimen Description BLOOD RIGHT ARM   Final   Special Requests BOTTLES DRAWN AEROBIC AND ANAEROBIC 5 CC EACH   Final   Culture  Setup Time     Final   Value: 11/21/2013 13:22     Performed at [facility]   Culture     Final   Value:        BLOOD CULTURE RECEIVED NO GROWTH TO DATE CULTURE WILL BE HELD FOR 5 DAYS BEFORE ISSUING A FINAL NEGATIVE REPORT     Performed at [facility]   Report Status PENDING   Incomplete     Scheduled Meds: . aspirin  325 mg Oral q morning - 10a  . buPROPion  300 mg Oral QAC breakfast  . calcium citrate 500 mg Oral QID  . cholecalciferol  1,000 Units Oral Daily  . diltiazem  120 mg Oral Daily  . enoxaparin  injection  40 mg Subcutaneous Q24H  . estradiol  2 mg Oral QAC breakfast  . linezolid  600 mg Oral Q12H  . metoprolol tartrate  50 mg Oral BID  . nicotine  14 mg Transdermal Daily  . pantoprazole IV  40 mg Intravenous Daily   Continuous Infusions: . sodium chloride       Faye Ramsay, MD  [facility] Pager [phone]  If 7PM-7AM, please contact night-coverage [email or web address] Password [email or web address] 11/26/2013, 9:41 AM   LOS: 8 days

## 2013-11-27 DIAGNOSIS — R5381 Other malaise: Secondary | ICD-10-CM

## 2013-11-27 DIAGNOSIS — R5383 Other fatigue: Secondary | ICD-10-CM

## 2013-11-27 DIAGNOSIS — E538 Deficiency of other specified B group vitamins: Secondary | ICD-10-CM

## 2013-11-27 DIAGNOSIS — E43 Unspecified severe protein-calorie malnutrition: Secondary | ICD-10-CM

## 2013-11-27 DIAGNOSIS — E236 Other disorders of pituitary gland: Secondary | ICD-10-CM

## 2013-11-27 DIAGNOSIS — F17200 Nicotine dependence, unspecified, uncomplicated: Secondary | ICD-10-CM

## 2013-11-27 DIAGNOSIS — Z932 Ileostomy status: Secondary | ICD-10-CM

## 2013-11-27 LAB — BASIC METABOLIC PANEL
Anion gap: 11 (ref 5–15)
BUN: 16 mg/dL (ref 6–23)
CALCIUM: 7.3 mg/dL — AB (ref 8.4–10.5)
CHLORIDE: 90 meq/L — AB (ref 96–112)
CO2: 26 mEq/L (ref 19–32)
CREATININE: 0.97 mg/dL (ref 0.50–1.10)
GFR, EST AFRICAN AMERICAN: 70 mL/min — AB (ref >90)
GFR, EST NON AFRICAN AMERICAN: 60 mL/min — AB (ref >90)
Glucose, Bld: 98 mg/dL (ref 70–99)
Potassium: 4.1 mEq/L (ref 3.7–5.3)
Sodium: 127 mEq/L — ABNORMAL LOW (ref 137–147)

## 2013-11-27 LAB — CULTURE, BLOOD (ROUTINE X 2)
CULTURE: NO GROWTH
Culture: NO GROWTH

## 2013-11-27 MED ORDER — DILTIAZEM HCL ER COATED BEADS 120 MG PO CP24: 120.0000 mg | ORAL_CAPSULE | Freq: Every day | ORAL | Status: DC

## 2013-11-27 MED ORDER — METOPROLOL TARTRATE 50 MG PO TABS: 50.0000 mg | ORAL_TABLET | Freq: Two times a day (BID) | ORAL | Status: DC

## 2013-11-27 MED ORDER — ENSURE COMPLETE PO LIQD: 237.0000 mL | Freq: Three times a day (TID) | ORAL | Status: DC

## 2013-11-27 MED ORDER — DOXYCYCLINE HYCLATE 100 MG PO TABS: 100.0000 mg | ORAL_TABLET | Freq: Two times a day (BID) | ORAL | Status: DC

## 2013-11-27 MED ORDER — LINEZOLID 600 MG PO TABS: 600.0000 mg | ORAL_TABLET | Freq: Two times a day (BID) | ORAL | Status: AC

## 2013-11-27 MED ORDER — HYDROMORPHONE HCL 2 MG PO TABS: 2.0000 mg | ORAL_TABLET | Freq: Four times a day (QID) | ORAL | Status: DC | PRN

## 2013-11-27 MED ORDER — SACCHAROMYCES BOULARDII 250 MG PO CAPS: 250.0000 mg | ORAL_CAPSULE | Freq: Two times a day (BID) | ORAL | Status: DC

## 2013-11-27 NOTE — Progress Notes (Signed)
[facility]: [facility]  Subjective:  Feels much better today; more alert  Objective:   Vital Signs in the last 24 hours: Temp:  [97.5 F (36.4 C)-98.4 F (36.9 C)] 97.5 F (36.4 C) (08/12 0517) Pulse Rate:  [64-91] 87 (08/12 0517) Resp:  [20] 20 (08/12 0517) BP: (91-101)/(56-66) 101/66 mmHg (08/12 0517) SpO2:  [92 %-100 %] 92 % (08/12 0517) Weight:  [145 lb 1.6 oz (65.817 kg)] 145 lb 1.6 oz (65.817 kg) (08/12 0517)  Intake/Output from previous day: 08/11 0701 - 08/12 0700 In: 360 [P.O.:360] Out: 1000 [Urine:750; Stool:250]  Medications: . sodium chloride   Intravenous Once  . antiseptic oral rinse  7 mL Mouth Rinse BID  . aspirin  325 mg Oral q morning - 10a  . buPROPion  300 mg Oral QAC breakfast  . calcium citrate  500 mg of elemental calcium Oral QID  . chlorhexidine  15 mL Mouth Rinse BID  . cholecalciferol  1,000 Units Oral Daily  . diltiazem  120 mg Oral Daily  . enoxaparin (LOVENOX) injection  40 mg Subcutaneous Q24H  . estradiol  2 mg Oral QAC breakfast  . feeding supplement (ENSURE COMPLETE)  237 mL Oral TID BM  . linezolid  600 mg Oral Q12H  . metoprolol tartrate  50 mg Oral BID  . nicotine  14 mg Transdermal Daily  . pantoprazole  40 mg Oral BID AC  . silver nitrate applicators  1 application Topical q morning - 10a       Physical Exam:  General appearance: alert, cooperative and no distress  Neck: no adenopathy, no JVD, supple, symmetrical, trachea midline and thyroid not enlarged, symmetric, no tenderness/mass/nodules  Lungs:  decreased BS; no wheezing Heart: regular rate and rhythm and 1-2 sem  Abdomen: ileostomy; mildly tender  Extremities: no edema, redness or tenderness in the calves or thighs  Neurologic: Grossly normal      Rate:70  Rhythm: normal sinus rhythm  Lab Results:   Recent Labs  11/26/13 0450 11/27/13 0345  NA 124* 127*  K 3.8 4.1  CL 87* 90*  CO2 25 26  GLUCOSE 110* 98  BUN 17 16  CREATININE 1.08 0.97   CBC Latest Ref Rng  11/26/2013 11/25/2013 11/22/2013  WBC 4.0 - 10.5 K/uL 16.6(H) 21.1(H) 9.7  Hemoglobin 12.0 - 15.0 g/dL 8.6(L) 9.1(L) 8.6(L)  Hematocrit 36.0 - 46.0 % 24.9(L) 26.4(L) 25.7(L)  Platelets 150 - 400 K/uL 181 182 185    No results found for this basename: TROPONINI, CK, MB,  in the last 72 hours  Hepatic Function Panel No results found for this basename: PROT, ALBUMIN, AST, ALT, ALKPHOS, BILITOT, BILIDIR, IBILI,  in the last 72 hours No results found for this basename: INR,  in the last 72 hours BNP (last 3 results)  Recent Labs  03/24/13 2031 03/25/13 0334 03/26/13 0414  PROBNP 9254.0* 9403.0* 4535.0*    Lipid Panel     Component Value Date/Time   CHOL 108 05/28/2012 0420   TRIG 105 05/28/2012 0420   HDL 92.50 12/15/2010 1144   CHOLHDL 2 12/15/2010 1144   VLDL 13.6 12/15/2010 1144   LDLCALC  Value: 115        Total Cholesterol/HDL:CHD Risk Coronary Heart Disease Risk Table                     Men   Women  1/2 Average Risk   3.4   3.3* 07/20/2007 0327      Imaging:  TEE: Left Ventrical: The cavity size was normal. Systolic function was normal. The estimated ejection fraction was in the range of 60% to 65%. Wall motion was normal; there were no regional wall motion abnormalities.  Ascending aorta: The ascending aorta was mildly dilated measuring 41 mm. Mitral Valve: Calcified annulus. Mild MR. No vegetation.  Aortic Valve: Trace AI, no vegetation.  Tricuspid Valve: Normal, no vegetation.  Pulmonic Valve: Grossly normal. No PR.  Left Atrium/ Left atrial appendage: No thrombus in LA or LAA.  Atrial septum: No PFO by color Doppler.  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     Assessment/Plan:   Principal Problem:   Acute respiratory distress Active Problems:   Dehydration   Hypotension   CMML (chronic myelomonocytic leukemia)   Open abdominal wall wound   Ileostomy status   Hypocalcemia   Anemia of other chronic disease   Metabolic acidosis    COPD (chronic obstructive pulmonary disease)   Anemia of chronic disease   UTI (lower urinary tract infection)   Weakness   UTI (urinary tract infection)   Severe sepsis   ARF (acute renal failure)   Urinary retention   CAP (community acquired pneumonia)   Leukocytosis, unspecified   Protein-calorie malnutrition, severe   Currently in sinus rhythm with rate now in the 70's. Normal LV function.  BP remains low at ~ 100 and precludes further titration of cardizem. Hyponatremia is slightly better 124 to 127 today; free water restrict. ID assessment noted. No vegetations seen on TEE to account for lung nodules. Leukocytosis improving.  GERD symptoms according to patient were better controlled with nexium rather than protonix.   Troy Sine, MD, [facility] 11/27/2013, 8:18 AM

## 2013-11-27 NOTE — Discharge Summary (Signed)
Physician Discharge Summary  Bienville Kaneko PFX:[id number] DOB: 1950-06-07 DOA: 11/18/2013  PCP: Darlin Coco, MD  Admit date: 11/18/2013 Discharge date: 11/27/2013  Time spent: >30 minutes  Recommendations for Outpatient Follow-up:  1. CBC to follow Hgb trend 2. BMET to follow electrolytes and renal function 3. Reassess HR and adjust medications for better control as needed 4. Follow up with pulmonary service in 4-6 weeks and will need to have CT chest at that time to follow lungs nodularity 5. Follow up with ID for further treatment of MRSA infection  Discharge Diagnoses:  Principal Problem:   Acute respiratory distress Active Problems:   Dehydration   Hypotension   CMML (chronic myelomonocytic leukemia)   Open abdominal wall wound   Ileostomy status   Hypocalcemia   Anemia of other chronic disease   Metabolic acidosis   COPD (chronic obstructive pulmonary disease)   Anemia of chronic disease   UTI (lower urinary tract infection)   Weakness   UTI (urinary tract infection)   Severe sepsis   ARF (acute renal failure)   Urinary retention   CAP (community acquired pneumonia)   Leukocytosis, unspecified   Protein-calorie malnutrition, severe   Discharge Condition: stable and improved. Will follow with PCP in 10 days and with Id in approx 2 weeks. Patient advised to follow with pulmonary service in 4-6 weeks to reassess pulmonary nodularity and treat further COPD  Diet recommendation: regular diet; fluid restricted to 1-1.2L daily  Filed Weights   11/25/13 0443 11/26/13 0500 11/27/13 0517  Weight: 65.1 kg (143 lb 8.3 oz) 64.456 kg (142 lb 1.6 oz) 65.817 kg (145 lb 1.6 oz)    History of present illness:  62 y.o. female with a PMH of CML on oral chemotherapy, COPD, anemia of chronic disease, HTN, and s/p ileostomy presents to the ED with main concern of several days duration of poor oral intake, nausea, and says it started after she was given epogen by her oncologist for  anemia. She explains that due to her nausea she is unable to eat or drink much and feels dehydrated . She denies chest pain or shortness of breath but reports chronic productive cough of clear sputum. Please note that pt doses off throughout the interview and is unable to provide clear history. No family at bedside.  In ED, pt noted to be hemodynamically stable, WBC 19.7, CT chest with ? Underlying lung cancer vs pneumonitis. TRH asked to admit for further e valuation.    Hospital Course:  Acute hypoxic respiratory distress  - with pulmonary nodules c/w PNA, suspect HCAP but underlying malignancy not excluded  - pt also with COPD and ongoing tobacco abuse  - pt is clinically improving, denies chest pain and shortness of breath at discharge - WBC trending down, and patient afebrile over the last 96 hours  - continue to provide supportive care with BD's, follow up with pulmonary service at discharge -good O2 sat on RA now, no will hold on oxygen supplementation    - needs repeat CT in 4-6 weeks to ensure clearance, pt and her daughter both made aware  - if nodularity persists, will need additional evaluation, with FOB   Septic shock, from HCAP, MRSA UTI  -resolved, lactic acid is WNL, vital signs now stable  -urine culture positive for MRSA (August 6th, 2015)  -Per ID recommendation and given MRSA infection will treat with zyvox for 2 weeks and then 2 weeks of doxycycline -no endocarditis on TEE  -Patient will follow with ID  in outpatient therapy -continue good hydration and nutrition.  MRSA UTI  -continue Zyvox day day 7/14, with continuation of anti-staph ABX with doxy 100mg  BID for another 2 weeks -will follow with Infectious disease service as an outpatient  Leukocytosis  -she has history of CML  -WBC's trending down -continue antibiotics as indicated by ID -no fever.  Atrial fibrillation with RVR  - rate controlled, and in NSR at discharge  - continue Metoprolol and Cardizem as  per cardiology rec's  - pt determined not to be AC candidate per her primary cardiologist   ARF (acute renal failure)  - likely from hypovolemia and developing of ATN  - Cr is WNL at discharge -close follow up of electrolytes and renal function at discharge  Mixed AG and non-AG metabolic acidosis (delta ratio 0.4 on 8/4)  - Suspect due to high-output ileostomy and GI losses + acute renal failure  - resolved at discharge  Hypokalemia with hypomagnesemia  - supplemented and WNL at discharge  -encourage good PO intake and nutrition  Open abdominal wall wound  - with iliostomy in place and with good output -no signs of overt/superimposed infection   Anemia of other chronic disease  -10 --> 8.4 --> 6.7 (August 5th, 2015)  -transfused 2 U PRBC August 5th, 2015 with appropriate increase in Hg post transfusion (8.6)  -Hgb has remained stable at 8.6; will need close monitoring (especially with use of Zyvox)  Hyponatremia:  -With concerns of for SIADH given lung infection and multiple pulmonary nodules. -Improving with fluid restrictions -At discharge sodium 127 and patient completely asymptomatic. -Advised to keep fluid intake restricted to 1-1.2 L per day.  COPD (chronic obstructive pulmonary disease):  - continue as needed BD's as indicated by pulmonary service -Patient advised to stop smoking -She will follow with pulmonary service in about 4 weeks after discharge for formal PFTs and repeat CT scan of the chest.  Protein-calorie malnutrition, severe  -Patient encourage to increase by mouth intake -Discharge feeding supplements (ensure)   Procedures: CXR 11/18/2013 1.5 x 1.5 cm nodular opacity right lower lobe. No edema or consolidation.  CXR 11/20/2013 Bibasilar pneumonia without significant change since yesterday.  CXR 11/19/2013 Nodular infiltrates, c/w PNA.  CT chest 11/18/2013 Numerous upper and lower lobe nodular densities bilaterally, R>L, ? diffuse pneumonitis vs metastatic dz   TEE 8/10 Negative for vegetations   Consultations:  PCCM  Cardiology  ID  Discharge Exam: Filed Vitals:   11/27/13 0517  BP: 101/66  Pulse: 87  Temp: 97.5 F (36.4 C)  Resp: 20    General: afebrile, NAD, denies CP or SOB. HR stable and well controlled. Cardiovascular: positive SEM, rate controlled, no rubs or gallops; no Le edema Respiratory: good air movement, no wheezing; good O2 sat on RA Abd: soft, ileostomy bag in place, no rebound, positive BS Neuro: no focal deficit.   Discharge Instructions You were cared for by a hospitalist during your hospital stay. If you have any questions about your discharge medications or the care you received while you were in the hospital after you are discharged, you can call the unit and asked to speak with the hospitalist on call if the hospitalist that took care of you is not available. Once you are discharged, your primary care physician will handle any further medical issues. Please note that NO REFILLS for any discharge medications will be authorized once you are discharged, as it is imperative that you return to your primary care physician (or establish a relationship  with a primary care physician if you do not have one) for your aftercare needs so that they can reassess your need for medications and monitor your lab values.  Discharge Instructions   Discharge instructions    Complete by:  As directed   Maintain good hydration and nutrition Take medications as prescribed Stop smoking Please follow with pulmonary service in 4-6 week to have CT chest repeated Arrange follow up with PCP in 2 weeks            Medication List    STOP taking these medications       bisoprolol 5 MG tablet  Commonly known as:  ZEBETA     celecoxib 200 MG capsule  Commonly known as:  CELEBREX     diphenoxylate-atropine 2.5-0.025 MG per tablet  Commonly known as:  LOMOTIL     HYDROcodone-acetaminophen 10-325 MG per tablet  Commonly known as:   NORCO     loperamide 2 MG capsule  Commonly known as:  IMODIUM      TAKE these medications       albuterol 108 (90 BASE) MCG/ACT inhaler  Commonly known as:  PROVENTIL HFA;VENTOLIN HFA  Inhale 1-2 puffs into the lungs every 6 (six) hours as needed for wheezing or shortness of breath.     aspirin 325 MG EC tablet  Take 325 mg by mouth every morning.     buPROPion 300 MG 24 hr tablet  Commonly known as:  WELLBUTRIN XL  Take 300 mg by mouth daily before breakfast.     calcium carbonate 750 MG chewable tablet  Commonly known as:  TUMS E-X 750  Chew 1 tablet (750 mg total) by mouth 3 (three) times daily.     cyclobenzaprine 10 MG tablet  Commonly known as:  FLEXERIL  Take 10 mg by mouth 3 (three) times daily as needed for muscle spasms.     diltiazem 120 MG 24 hr capsule  Commonly known as:  CARDIZEM CD  Take 1 capsule (120 mg total) by mouth daily.     doxycycline 100 MG tablet  Commonly known as:  VIBRA-TABS  Take 1 tablet (100 mg total) by mouth 2 (two) times daily.  Start taking on:  12/10/2013     esomeprazole 40 MG capsule  Commonly known as:  NEXIUM  Take 40 mg by mouth 2 (two) times daily before a meal.     estradiol 2 MG tablet  Commonly known as:  ESTRACE  Take 2 mg by mouth daily before breakfast.     feeding supplement (ENSURE COMPLETE) Liqd  Take 237 mLs by mouth 3 (three) times daily between meals.     HYDROmorphone 2 MG tablet  Commonly known as:  DILAUDID  Take 1-2 tablets (2-4 mg total) by mouth every 6 (six) hours as needed for severe pain.     linezolid 600 MG tablet  Commonly known as:  ZYVOX  Take 1 tablet (600 mg total) by mouth every 12 (twelve) hours.     loratadine-pseudoephedrine 5-120 MG per tablet  Commonly known as:  CLARITIN-D 12-hour  Take 1 tablet by mouth daily as needed for allergies.     metoprolol 50 MG tablet  Commonly known as:  LOPRESSOR  Take 1 tablet (50 mg total) by mouth 2 (two) times daily.     nicotine 14 mg/24hr  patch  Commonly known as:  NICODERM CQ - dosed in mg/24 hours  Place 1 patch (14 mg total) onto the skin daily.  ondansetron 4 MG tablet  Commonly known as:  ZOFRAN  Take 4 mg by mouth every 8 (eight) hours as needed for nausea or vomiting.     promethazine 25 MG tablet  Commonly known as:  PHENERGAN  Take 25 mg by mouth every 6 (six) hours as needed for nausea or vomiting (nausea).     saccharomyces boulardii 250 MG capsule  Commonly known as:  FLORASTOR  Take 1 capsule (250 mg total) by mouth 2 (two) times daily.       Allergies  Allergen Reactions  . Vancomycin Hives and Rash    wheezing  . Ativan [Lorazepam] Other (See Comments)    "makes me crazy"  . Codeine Nausea And Vomiting  . Tetanus Toxoids Other (See Comments)    Serum reaction  . Penicillins Hives and Rash  . Xarelto [Rivaroxaban] Hives and Rash    (May not be allergic. Vancomycin was also being taken when reaction occurred.)       Follow-up Information   Follow up with Collene Gobble., MD On 12/26/2013. (10:15am)    Specialty:  Pulmonary Disease   Contact information:   [address] [address] [phone]       Schedule an appointment as soon as possible for a visit with Alcide Evener, MD.   Specialty:  Infectious Diseases   Contact information:   [address] [place] [address] [phone]       Follow up with Faye Ramsay, MD. (As needed, If symptoms worsen)    Specialty:  Internal Medicine   Contact information:   [address] [place] [address] [phone]       Schedule an appointment as soon as possible for a visit with Darlin Coco, MD.   Specialty:  Cardiology   Contact information:   [address] [place] [address] [phone]       The results of significant diagnostics from this hospitalization (including imaging, microbiology, ancillary and laboratory) are listed below for reference.     Significant Diagnostic Studies: Dg Chest 2 View  11/18/2013   CLINICAL DATA:  Hypoxia  EXAM: CHEST  2 VIEW  COMPARISON:  October 15, 2013  FINDINGS: There is a 1.5 x 1.5 cm nodular opacity in the right lower lobe. There are scattered areas of mild scarring bilaterally. There is no edema or consolidation. Heart is upper normal in size with pulmonary vascularity within normal limits. No adenopathy. No bone lesions.  IMPRESSION: 1.5 x 1.5 cm nodular opacity right lower lobe. This finding warrants noncontrast enhanced chest CT to further evaluate.  No edema or consolidation.   Electronically Signed   By: Lowella Grip M.D.   On: 11/18/2013 21:10   Ct Chest Wo Contrast  11/18/2013   CLINICAL DATA:  Abnormal chest x-ray.  Rule out mass lesion  EXAM: CT CHEST WITHOUT CONTRAST  TECHNIQUE: Multidetector CT imaging of the chest was performed following the standard protocol without IV contrast.  COMPARISON:  Chest x-ray 11/18/2013  FINDINGS: Multiple ill-defined nodular densities in the right upper lobe and right lower lobe. Confluent infiltrate in the right posterior lung base is present consistent with pneumonia. Other nodules may also be infectious however tumor not excluded.  Mild patchy nodular infiltrates in the left upper lobe and left lower lobe.  Negative for pleural effusion. Negative for adenopathy. Esophagus is dilated and fluid-filled. No acute bony change.  IMPRESSION: Numerous upper and lower lobe nodular  densities bilaterally, right greater than left. Favor diffuse pneumonitis however metastatic disease cannot be excluded. Correlate with fever and white count. Followup CT after antibiotic treatment would be helpful to assure clearing.   Electronically Signed   By: Franchot Gallo M.D.   On: 11/18/2013 23:24   Dg Chest Port 1 View  11/20/2013   CLINICAL DATA:  Pneumonia.  EXAM: PORTABLE CHEST - 1 VIEW  COMPARISON:  11/19/2013  FINDINGS: No cardiomegaly. Unchanged upper mediastinal contours, including  aortic tortuosity.  Left IJ central line, tip at the SVC origin.  Nodular airspace disease in the lower lungs is similar to previous. There may have been slight progression at the left base, although not convincing. No effusion. No pneumothorax.  IMPRESSION: Bibasilar pneumonia without significant change since yesterday.   Electronically Signed   By: Jorje Guild M.D.   On: 11/20/2013 06:17   Dg Chest Port 1 View  11/19/2013   CLINICAL DATA:  Central line placement.  EXAM: PORTABLE CHEST - 1 VIEW  COMPARISON:  CT and chest x-ray of 11/18/2013.  FINDINGS: Left IJ line in good anatomic position. Mediastinum and hilar structures are unremarkable. Cardiovascular structures are unremarkable. Patchy nodular pulmonary infiltrates noted, better demonstrated by prior chest CT of 11/18/2013. No pleural effusion or pneumothorax. No acute bony abnormality.  IMPRESSION: 1. Patchy nodular bilateral infiltrates suggesting pneumonia again noted. These are better demonstrated on prior CT of 11/18/2013. Again follow-up is needed to ensure complete clearing to exclude metastatic disease. Reference is made to CT report of 11/18/2013. 2. Left IJ line in good anatomic position.   Electronically Signed   By: Marcello Moores  Register   On: 11/19/2013 10:17    Microbiology: Recent Results (from the past 240 hour(s))  CULTURE, BLOOD (ROUTINE X 2)     Status: None   Collection Time    11/18/13 11:25 PM      Result Value Ref Range Status   Specimen Description BLOOD L ARM   Final   Special Requests BOTTLES DRAWN AEROBIC AND ANAEROBIC 5CC   Final   Culture  Setup Time     Final   Value: 11/19/2013 03:53     Performed at [facility]   Culture     Final   Value: NO GROWTH 5 DAYS     Performed at [facility]   Report Status 11/25/2013 FINAL   Final  CULTURE, BLOOD (ROUTINE X 2)     Status: None   Collection Time    11/18/13 11:30 PM      Result Value Ref Range Status   Specimen Description BLOOD LEFT  ANTECUBITAL   Final   Special Requests BOTTLES DRAWN AEROBIC AND ANAEROBIC 5CC   Final   Culture  Setup Time     Final   Value: 11/19/2013 03:54     Performed at [facility]   Culture     Final   Value: NO GROWTH 5 DAYS     Performed at [facility]   Report Status 11/25/2013 FINAL   Final  MRSA PCR SCREENING     Status: Abnormal   Collection Time    11/19/13  2:45 AM      Result Value Ref Range Status   MRSA by PCR POSITIVE (*) NEGATIVE Final   Comment:            The GeneXpert MRSA Assay (FDA     approved for NASAL specimens     only), is one component  of a     comprehensive MRSA colonization     surveillance program. It is not     intended to diagnose MRSA     infection nor to guide or     monitor treatment for     MRSA infections.     RESULT CALLED TO, READ BACK BY AND VERIFIED WITH:     JOHNSON RN AT 0510 ON 08.04.15 BY SHUEA  URINE CULTURE     Status: None   Collection Time    11/19/13  8:49 AM      Result Value Ref Range Status   Specimen Description URINE, CATHETERIZED   Final   Special Requests NONE   Final   Culture  Setup Time     Final   Value: 11/19/2013 11:10     Performed at [facility]     Final   Value: >=100,000 COLONIES/ML     Performed at [facility]   Culture     Final   Value: METHICILLIN RESISTANT STAPHYLOCOCCUS AUREUS     Note: RIFAMPIN AND GENTAMICIN SHOULD NOT BE USED AS SINGLE DRUGS FOR TREATMENT OF STAPH INFECTIONS. CRITICAL RESULT CALLED TO, READ BACK BY AND VERIFIED WITH: STEPHANIE DILLON @ 8:31AM 11/21/13 BY DWEEKS     Performed at [facility]   Report Status 11/21/2013 FINAL   Final   Organism ID, Bacteria METHICILLIN RESISTANT STAPHYLOCOCCUS AUREUS   Final  CULTURE, BLOOD (ROUTINE X 2)     Status: None   Collection Time    11/21/13  9:50 AM      Result Value Ref Range Status   Specimen Description BLOOD LEFT HAND   Final   Special Requests BOTTLES DRAWN AEROBIC AND ANAEROBIC [facility]  EACH   Final   Culture  Setup Time     Final   Value: 11/21/2013 13:23     Performed at [facility]   Culture     Final   Value: NO GROWTH 5 DAYS     Performed at [facility]   Report Status 11/27/2013 FINAL   Final  CULTURE, BLOOD (ROUTINE X 2)     Status: None   Collection Time    11/21/13  9:50 AM      Result Value Ref Range Status   Specimen Description BLOOD RIGHT ARM   Final   Special Requests BOTTLES DRAWN AEROBIC AND ANAEROBIC 5 CC EACH   Final   Culture  Setup Time     Final   Value: 11/21/2013 13:22     Performed at [facility]   Culture     Final   Value: NO GROWTH 5 DAYS     Performed at [facility]   Report Status 11/27/2013 FINAL   Final     Labs: Basic Metabolic Panel:  Recent Labs Lab 11/23/13 0440 11/24/13 0430 11/25/13 0420 11/26/13 0450 11/27/13 0345  NA 128* 127* 126* 124* 127*  K 4.0 3.5* 4.0 3.8 4.1  CL 89* 87* 87* 87* 90*  CO2 28 27 25 25 26   GLUCOSE 87 95 116* 110* 98  BUN 9 10 14 17 16   CREATININE 0.78 0.75 1.03 1.08 0.97  CALCIUM 6.1* 6.3* 7.0* 7.0* 7.3*  MG 0.6* 1.3* 2.3  --   --    CBC:  Recent Labs Lab 11/21/13 0530 11/22/13 0530 11/25/13 0825 11/26/13 0450  WBC 7.6 9.7 21.1* 16.6*  NEUTROABS 3.2  --   --  10.6*  HGB 8.4* 8.6* 9.1* 8.6*  HCT 24.3* 25.7* 26.4* 24.9*  MCV 91.7 93.8 91.7 92.9  PLT 154 185 182 181   Cardiac Enzymes:  Recent Labs Lab 11/20/13 1500  TROPONINI <0.30   BNP: BNP (last 3 results)  Recent Labs  03/24/13 2031 03/25/13 0334 03/26/13 0414  PROBNP 9254.0* 9403.0* 4535.0*    Signed:  Barton Dubois  [facility] 11/27/2013, 12:38 PM

## 2013-12-02 ENCOUNTER — Ambulatory Visit: Payer: BC Managed Care – PPO | Admitting: Cardiology

## 2013-12-03 ENCOUNTER — Telehealth: Payer: Self-pay | Admitting: *Deleted

## 2013-12-03 ENCOUNTER — Other Ambulatory Visit: Payer: BC Managed Care – PPO

## 2013-12-03 ENCOUNTER — Ambulatory Visit: Payer: BC Managed Care – PPO

## 2013-12-03 NOTE — Telephone Encounter (Signed)
Does she have IV access and when does she wants to come for blood

## 2013-12-03 NOTE — Telephone Encounter (Signed)
Pt missed lab/injection appt today.  States is just too weak to get here and also does not want any more Epogen until she can discuss w/ Dr. Alvy Bimler.  State she got really sick right after last Epogen injection and has been in hospital for over a week w/ dehydration.  She would like to be r/s for lab and asks if Dr. Alvy Bimler will order IVFs?  She just doesn't want Epogen again until she sees Dr. Alvy Bimler.

## 2013-12-04 ENCOUNTER — Encounter: Payer: Self-pay | Admitting: *Deleted

## 2013-12-04 ENCOUNTER — Other Ambulatory Visit: Payer: Self-pay | Admitting: Hematology and Oncology

## 2013-12-05 ENCOUNTER — Telehealth: Payer: Self-pay | Admitting: *Deleted

## 2013-12-05 ENCOUNTER — Other Ambulatory Visit: Payer: BC Managed Care – PPO

## 2013-12-05 ENCOUNTER — Encounter: Payer: Self-pay | Admitting: Cardiology

## 2013-12-05 ENCOUNTER — Telehealth: Payer: Self-pay | Admitting: Hematology and Oncology

## 2013-12-05 NOTE — Telephone Encounter (Signed)
Infusion unable to add patient for IVF today. Pt states she is "doing some better, walking better-not as dizzy, drinking OK, slept well, still having reflux" To have lab and IVF on Friday. Instructed patient to call back if she feels worse.Marland KitchenMarland KitchenVerbalized understanding

## 2013-12-05 NOTE — Telephone Encounter (Signed)
tammy advised pt on appts.Marland KitchenMarland Kitchen

## 2013-12-06 ENCOUNTER — Other Ambulatory Visit: Payer: Self-pay | Admitting: Hematology and Oncology

## 2013-12-06 ENCOUNTER — Ambulatory Visit (HOSPITAL_BASED_OUTPATIENT_CLINIC_OR_DEPARTMENT_OTHER): Payer: BC Managed Care – PPO

## 2013-12-06 ENCOUNTER — Other Ambulatory Visit (HOSPITAL_BASED_OUTPATIENT_CLINIC_OR_DEPARTMENT_OTHER): Payer: BC Managed Care – PPO

## 2013-12-06 VITALS — BP 103/70 | HR 120 | Temp 97.9°F | Resp 18

## 2013-12-06 DIAGNOSIS — D638 Anemia in other chronic diseases classified elsewhere: Secondary | ICD-10-CM

## 2013-12-06 DIAGNOSIS — C9310 Chronic myelomonocytic leukemia not having achieved remission: Secondary | ICD-10-CM

## 2013-12-06 DIAGNOSIS — C9210 Chronic myeloid leukemia, BCR/ABL-positive, not having achieved remission: Secondary | ICD-10-CM

## 2013-12-06 DIAGNOSIS — E860 Dehydration: Secondary | ICD-10-CM

## 2013-12-06 DIAGNOSIS — Z932 Ileostomy status: Secondary | ICD-10-CM

## 2013-12-06 LAB — CBC & DIFF AND RETIC
BASO%: 0.2 % (ref 0.0–2.0)
BASOS ABS: 0 10*3/uL (ref 0.0–0.1)
EOS%: 0.3 % (ref 0.0–7.0)
Eosinophils Absolute: 0 10*3/uL (ref 0.0–0.5)
HCT: 27.8 % — ABNORMAL LOW (ref 34.8–46.6)
HEMOGLOBIN: 9.2 g/dL — AB (ref 11.6–15.9)
Immature Retic Fract: 20.6 % — ABNORMAL HIGH (ref 1.60–10.00)
LYMPH%: 17.9 % (ref 14.0–49.7)
MCH: 30.9 pg (ref 25.1–34.0)
MCHC: 33.1 g/dL (ref 31.5–36.0)
MCV: 93.3 fL (ref 79.5–101.0)
MONO#: 4.5 10*3/uL — ABNORMAL HIGH (ref 0.1–0.9)
MONO%: 31.9 % — ABNORMAL HIGH (ref 0.0–14.0)
NEUT#: 7 10*3/uL — ABNORMAL HIGH (ref 1.5–6.5)
NEUT%: 49.7 % (ref 38.4–76.8)
Platelets: 408 10*3/uL — ABNORMAL HIGH (ref 145–400)
RBC: 2.98 10*6/uL — ABNORMAL LOW (ref 3.70–5.45)
RDW: 19.1 % — AB (ref 11.2–14.5)
RETIC %: 1.87 % (ref 0.70–2.10)
Retic Ct Abs: 55.73 10*3/uL (ref 33.70–90.70)
WBC: 14.2 10*3/uL — ABNORMAL HIGH (ref 3.9–10.3)
lymph#: 2.5 10*3/uL (ref 0.9–3.3)
nRBC: 1 % — ABNORMAL HIGH (ref 0–0)

## 2013-12-06 LAB — TECHNOLOGIST REVIEW

## 2013-12-06 LAB — HOLD TUBE, BLOOD BANK

## 2013-12-06 MED ORDER — ONDANSETRON 8 MG/50ML IVPB (CHCC)
8.0000 mg | Freq: Every day | INTRAVENOUS | Status: DC | PRN
  Administered 2013-12-06: 8 mg via INTRAVENOUS

## 2013-12-06 MED ORDER — ONDANSETRON 8 MG/NS 50 ML IVPB
INTRAVENOUS | Status: AC
Start: 2013-12-06 — End: 2013-12-06
  Filled 2013-12-06: qty 8

## 2013-12-06 MED ORDER — SODIUM CHLORIDE 0.9 % IV SOLN
Freq: Once | INTRAVENOUS | Status: AC
  Administered 2013-12-06: 13:00:00 via INTRAVENOUS

## 2013-12-06 MED ORDER — PROMETHAZINE HCL 25 MG/ML IJ SOLN
25.0000 mg | Freq: Every day | INTRAMUSCULAR | Status: DC | PRN
  Administered 2013-12-06: 12.5 mg via INTRAVENOUS
  Filled 2013-12-06: qty 1

## 2013-12-06 MED ORDER — SODIUM CHLORIDE 0.9 % IV SOLN
1000.0000 mL | Freq: Once | INTRAVENOUS | Status: AC
  Administered 2013-12-06: 11:00:00 via INTRAVENOUS

## 2013-12-06 NOTE — Patient Instructions (Signed)
Nausea and Vomiting  Nausea is a sick feeling that often comes before throwing up (vomiting). Vomiting is a reflex where stomach contents come out of your mouth. Vomiting can cause severe loss of body fluids (dehydration). Children and elderly adults can become dehydrated quickly, especially if they also have diarrhea. Nausea and vomiting are symptoms of a condition or disease. It is important to find the cause of your symptoms.  CAUSES    Direct irritation of the stomach lining. This irritation can result from increased acid production (gastroesophageal reflux disease), infection, food poisoning, taking certain medicines (such as nonsteroidal anti-inflammatory drugs), alcohol use, or tobacco use.   Signals from the brain.These signals could be caused by a headache, heat exposure, an inner ear disturbance, increased pressure in the brain from injury, infection, a tumor, or a concussion, pain, emotional stimulus, or metabolic problems.   An obstruction in the gastrointestinal tract (bowel obstruction).   Illnesses such as diabetes, hepatitis, gallbladder problems, appendicitis, kidney problems, cancer, sepsis, atypical symptoms of a heart attack, or eating disorders.   Medical treatments such as chemotherapy and radiation.   Receiving medicine that makes you sleep (general anesthetic) during surgery.  DIAGNOSIS  Your caregiver may ask for tests to be done if the problems do not improve after a few days. Tests may also be done if symptoms are severe or if the reason for the nausea and vomiting is not clear. Tests may include:   Urine tests.   Blood tests.   Stool tests.   Cultures (to look for evidence of infection).   X-rays or other imaging studies.  Test results can help your caregiver make decisions about treatment or the need for additional tests.  TREATMENT  You need to stay well hydrated. Drink frequently but in small amounts.You may wish to drink water, sports drinks, clear broth, or eat  frozen ice pops or gelatin dessert to help stay hydrated.When you eat, eating slowly may help prevent nausea.There are also some antinausea medicines that may help prevent nausea.  HOME CARE INSTRUCTIONS    Take all medicine as directed by your caregiver.   If you do not have an appetite, do not force yourself to eat. However, you must continue to drink fluids.   If you have an appetite, eat a normal diet unless your caregiver tells you differently.   Eat a variety of complex carbohydrates (rice, wheat, potatoes, bread), lean meats, yogurt, fruits, and vegetables.   Avoid high-fat foods because they are more difficult to digest.   Drink enough water and fluids to keep your urine clear or pale yellow.   If you are dehydrated, ask your caregiver for specific rehydration instructions. Signs of dehydration may include:   Severe thirst.   Dry lips and mouth.   Dizziness.   Dark urine.   Decreasing urine frequency and amount.   Confusion.   Rapid breathing or pulse.  SEEK IMMEDIATE MEDICAL CARE IF:    You have blood or brown flecks (like coffee grounds) in your vomit.   You have black or bloody stools.   You have a severe headache or stiff neck.   You are confused.   You have severe abdominal pain.   You have chest pain or trouble breathing.   You do not urinate at least once every 8 hours.   You develop cold or clammy skin.   You continue to vomit for longer than 24 to 48 hours.   You have a fever.  MAKE SURE YOU:      Understand these instructions.   Will watch your condition.   Will get help right away if you are not doing well or get worse.  Document Released: 04/04/2005 Document Revised: 06/27/2011 Document Reviewed: 09/01/2010  ExitCare Patient Information 2015 [employer]. This information is not intended to replace advice given to you by your health care provider. Make sure you discuss any questions you have with your health care provider.  Dehydration, Adult  Dehydration is when you  lose more fluids from the body than you take in. Vital organs like the kidneys, brain, and heart cannot function without a proper amount of fluids and salt. Any loss of fluids from the body can cause dehydration.   CAUSES    Vomiting.   Diarrhea.   Excessive sweating.   Excessive urine output.   Fever.  SYMPTOMS   Mild dehydration   Thirst.   Dry lips.   Slightly dry mouth.  Moderate dehydration   Very dry mouth.   Sunken eyes.   Skin does not bounce back quickly when lightly pinched and released.   Dark urine and decreased urine production.   Decreased tear production.   Headache.  Severe dehydration   Very dry mouth.   Extreme thirst.   Rapid, weak pulse (more than 100 beats per minute at rest).   Cold hands and feet.   Not able to sweat in spite of heat and temperature.   Rapid breathing.   Blue lips.   Confusion and lethargy.   Difficulty being awakened.   Minimal urine production.   No tears.  DIAGNOSIS   Your caregiver will diagnose dehydration based on your symptoms and your exam. Blood and urine tests will help confirm the diagnosis. The diagnostic evaluation should also identify the cause of dehydration.  TREATMENT   Treatment of mild or moderate dehydration can often be done at home by increasing the amount of fluids that you drink. It is best to drink small amounts of fluid more often. Drinking too much at one time can make vomiting worse. Refer to the home care instructions below.  Severe dehydration needs to be treated at the hospital where you will probably be given intravenous (IV) fluids that contain water and electrolytes.  HOME CARE INSTRUCTIONS    Ask your caregiver about specific rehydration instructions.   Drink enough fluids to keep your urine clear or pale yellow.   Drink small amounts frequently if you have nausea and vomiting.   Eat as you normally do.   Avoid:   Foods or drinks high in sugar.   Carbonated drinks.   Juice.   Extremely hot or cold  fluids.   Drinks with caffeine.   Fatty, greasy foods.   Alcohol.   Tobacco.   Overeating.   Gelatin desserts.   Wash your hands well to avoid spreading bacteria and viruses.   Only take over-the-counter or prescription medicines for pain, discomfort, or fever as directed by your caregiver.   Ask your caregiver if you should continue all prescribed and over-the-counter medicines.   Keep all follow-up appointments with your caregiver.  SEEK MEDICAL CARE IF:   You have abdominal pain and it increases or stays in one area (localizes).   You have a rash, stiff neck, or severe headache.   You are irritable, sleepy, or difficult to awaken.   You are weak, dizzy, or extremely thirsty.  SEEK IMMEDIATE MEDICAL CARE IF:    You are unable to keep fluids down or you get worse despite   treatment.   You have frequent episodes of vomiting or diarrhea.   You have blood or green matter (bile) in your vomit.   You have blood in your stool or your stool looks black and tarry.   You have not urinated in 6 to 8 hours, or you have only urinated a small amount of very dark urine.   You have a fever.   You faint.  MAKE SURE YOU:    Understand these instructions.   Will watch your condition.   Will get help right away if you are not doing well or get worse.  Document Released: 04/04/2005 Document Revised: 06/27/2011 Document Reviewed: 11/22/2010  [employer] Patient Information 2015 [employer]. This information is not intended to replace advice given to you by your health care provider. Make sure you discuss any questions you have with your health care provider.

## 2013-12-06 NOTE — Progress Notes (Signed)
Dr. Alvy Bimler notified BP 70/palp per manual by Shauna Hugh, RN and patient reports of dizziness; per MD run IVF as bolus or "however fast IV can handle" and report new BP at completion. RN to follow up and monitor.  Hominy Dr. Alvy Bimler at chairside to assess patient at completion of 1st liter. Per MD, give additional 1 liter NS at 750 if patient able to tolerate. Verbal order received for additional IVF today, 1 liter over 2 hours on Saturday 12/07/13, and to leave PIV in place to use on Saturday. Patient is in agreement with this plan.   1435 2nd liter nearing end of infusion, patient reports improvement in nausea and dizziness.   1445 2nd liter NS complete VSS improved from time of arrival. Patient reports dizziness upon transferring from infusion rm chair to wheelchair. She states she does not feel good when she gets up. This RN instructed patient to remain here until I consult with Dr. Alvy Bimler about a plan. MD with patient on the exam side and I was not able to speak to her about pt's current status. When I returned to the patient in infusion room, she had been taken to the lobby for discharge via wheelchair. Patient scheduled to return tomorrow for additional IVF, patient and family given AVS and schedule and they are aware of when to return to clinic.

## 2013-12-07 ENCOUNTER — Telehealth: Payer: Self-pay | Admitting: *Deleted

## 2013-12-07 ENCOUNTER — Ambulatory Visit: Payer: BC Managed Care – PPO

## 2013-12-07 NOTE — Telephone Encounter (Signed)
Called patient to see if she is coming for IVF today. Daughter states patient does not feel she needs to come today for fluids," is doing better"

## 2013-12-14 ENCOUNTER — Emergency Department (HOSPITAL_COMMUNITY): Payer: BC Managed Care – PPO

## 2013-12-14 ENCOUNTER — Inpatient Hospital Stay (HOSPITAL_COMMUNITY)
Admission: EM | Admit: 2013-12-14 | Discharge: 2013-12-22 | DRG: 871 | Disposition: A | Discharge status: Home / Self Care | Payer: BC Managed Care – PPO | Attending: Internal Medicine | Admitting: Internal Medicine

## 2013-12-14 ENCOUNTER — Encounter (HOSPITAL_COMMUNITY): Payer: Self-pay | Admitting: Emergency Medicine

## 2013-12-14 ENCOUNTER — Inpatient Hospital Stay (HOSPITAL_COMMUNITY): Payer: BC Managed Care – PPO

## 2013-12-14 DIAGNOSIS — I10 Essential (primary) hypertension: Secondary | ICD-10-CM | POA: Diagnosis present

## 2013-12-14 DIAGNOSIS — K560 Paralytic ileus: Secondary | ICD-10-CM | POA: Diagnosis present

## 2013-12-14 DIAGNOSIS — J449 Chronic obstructive pulmonary disease, unspecified: Secondary | ICD-10-CM | POA: Diagnosis present

## 2013-12-14 DIAGNOSIS — E43 Unspecified severe protein-calorie malnutrition: Secondary | ICD-10-CM

## 2013-12-14 DIAGNOSIS — R110 Nausea: Secondary | ICD-10-CM | POA: Diagnosis present

## 2013-12-14 DIAGNOSIS — Z932 Ileostomy status: Secondary | ICD-10-CM

## 2013-12-14 DIAGNOSIS — Z8614 Personal history of Methicillin resistant Staphylococcus aureus infection: Secondary | ICD-10-CM

## 2013-12-14 DIAGNOSIS — R6520 Severe sepsis without septic shock: Secondary | ICD-10-CM | POA: Diagnosis present

## 2013-12-14 DIAGNOSIS — Z881 Allergy status to other antibiotic agents status: Secondary | ICD-10-CM | POA: Diagnosis not present

## 2013-12-14 DIAGNOSIS — Z96649 Presence of unspecified artificial hip joint: Secondary | ICD-10-CM | POA: Diagnosis not present

## 2013-12-14 DIAGNOSIS — E872 Acidosis, unspecified: Secondary | ICD-10-CM | POA: Diagnosis present

## 2013-12-14 DIAGNOSIS — E8779 Other fluid overload: Secondary | ICD-10-CM | POA: Diagnosis present

## 2013-12-14 DIAGNOSIS — C9110 Chronic lymphocytic leukemia of B-cell type not having achieved remission: Secondary | ICD-10-CM | POA: Diagnosis present

## 2013-12-14 DIAGNOSIS — Z888 Allergy status to other drugs, medicaments and biological substances status: Secondary | ICD-10-CM

## 2013-12-14 DIAGNOSIS — Z7982 Long term (current) use of aspirin: Secondary | ICD-10-CM | POA: Diagnosis not present

## 2013-12-14 DIAGNOSIS — N179 Acute kidney failure, unspecified: Secondary | ICD-10-CM | POA: Diagnosis present

## 2013-12-14 DIAGNOSIS — R918 Other nonspecific abnormal finding of lung field: Secondary | ICD-10-CM | POA: Diagnosis present

## 2013-12-14 DIAGNOSIS — Z9049 Acquired absence of other specified parts of digestive tract: Secondary | ICD-10-CM | POA: Diagnosis not present

## 2013-12-14 DIAGNOSIS — A419 Sepsis, unspecified organism: Secondary | ICD-10-CM | POA: Diagnosis present

## 2013-12-14 DIAGNOSIS — Z885 Allergy status to narcotic agent status: Secondary | ICD-10-CM | POA: Diagnosis not present

## 2013-12-14 DIAGNOSIS — A4902 Methicillin resistant Staphylococcus aureus infection, unspecified site: Secondary | ICD-10-CM | POA: Diagnosis present

## 2013-12-14 DIAGNOSIS — X58XXXA Exposure to other specified factors, initial encounter: Secondary | ICD-10-CM | POA: Diagnosis present

## 2013-12-14 DIAGNOSIS — R112 Nausea with vomiting, unspecified: Secondary | ICD-10-CM | POA: Diagnosis present

## 2013-12-14 DIAGNOSIS — R42 Dizziness and giddiness: Secondary | ICD-10-CM | POA: Diagnosis present

## 2013-12-14 DIAGNOSIS — R7989 Other specified abnormal findings of blood chemistry: Secondary | ICD-10-CM | POA: Diagnosis present

## 2013-12-14 DIAGNOSIS — Z880 Allergy status to penicillin: Secondary | ICD-10-CM | POA: Diagnosis not present

## 2013-12-14 DIAGNOSIS — Z79899 Other long term (current) drug therapy: Secondary | ICD-10-CM | POA: Diagnosis not present

## 2013-12-14 DIAGNOSIS — A09 Infectious gastroenteritis and colitis, unspecified: Secondary | ICD-10-CM | POA: Diagnosis present

## 2013-12-14 DIAGNOSIS — Z823 Family history of stroke: Secondary | ICD-10-CM

## 2013-12-14 DIAGNOSIS — R197 Diarrhea, unspecified: Secondary | ICD-10-CM

## 2013-12-14 DIAGNOSIS — C9310 Chronic myelomonocytic leukemia not having achieved remission: Secondary | ICD-10-CM | POA: Diagnosis present

## 2013-12-14 DIAGNOSIS — R579 Shock, unspecified: Secondary | ICD-10-CM | POA: Diagnosis present

## 2013-12-14 DIAGNOSIS — F40298 Other specified phobia: Secondary | ICD-10-CM | POA: Diagnosis present

## 2013-12-14 DIAGNOSIS — E871 Hypo-osmolality and hyponatremia: Secondary | ICD-10-CM | POA: Diagnosis present

## 2013-12-14 DIAGNOSIS — N390 Urinary tract infection, site not specified: Secondary | ICD-10-CM | POA: Diagnosis present

## 2013-12-14 DIAGNOSIS — E669 Obesity, unspecified: Secondary | ICD-10-CM | POA: Diagnosis present

## 2013-12-14 DIAGNOSIS — K219 Gastro-esophageal reflux disease without esophagitis: Secondary | ICD-10-CM | POA: Diagnosis present

## 2013-12-14 DIAGNOSIS — E236 Other disorders of pituitary gland: Secondary | ICD-10-CM | POA: Diagnosis present

## 2013-12-14 DIAGNOSIS — E861 Hypovolemia: Secondary | ICD-10-CM | POA: Diagnosis present

## 2013-12-14 DIAGNOSIS — M129 Arthropathy, unspecified: Secondary | ICD-10-CM | POA: Diagnosis present

## 2013-12-14 DIAGNOSIS — A0472 Enterocolitis due to Clostridium difficile, not specified as recurrent: Secondary | ICD-10-CM | POA: Diagnosis not present

## 2013-12-14 DIAGNOSIS — Z8249 Family history of ischemic heart disease and other diseases of the circulatory system: Secondary | ICD-10-CM | POA: Diagnosis not present

## 2013-12-14 DIAGNOSIS — I959 Hypotension, unspecified: Secondary | ICD-10-CM

## 2013-12-14 DIAGNOSIS — R000 Tachycardia, unspecified: Secondary | ICD-10-CM | POA: Diagnosis present

## 2013-12-14 DIAGNOSIS — I4891 Unspecified atrial fibrillation: Secondary | ICD-10-CM | POA: Diagnosis present

## 2013-12-14 DIAGNOSIS — C9210 Chronic myeloid leukemia, BCR/ABL-positive, not having achieved remission: Secondary | ICD-10-CM | POA: Diagnosis present

## 2013-12-14 DIAGNOSIS — Z933 Colostomy status: Secondary | ICD-10-CM | POA: Diagnosis not present

## 2013-12-14 DIAGNOSIS — E538 Deficiency of other specified B group vitamins: Secondary | ICD-10-CM | POA: Diagnosis present

## 2013-12-14 DIAGNOSIS — Z87891 Personal history of nicotine dependence: Secondary | ICD-10-CM | POA: Diagnosis not present

## 2013-12-14 DIAGNOSIS — J189 Pneumonia, unspecified organism: Secondary | ICD-10-CM

## 2013-12-14 DIAGNOSIS — E876 Hypokalemia: Secondary | ICD-10-CM | POA: Diagnosis present

## 2013-12-14 DIAGNOSIS — J432 Centrilobular emphysema: Secondary | ICD-10-CM

## 2013-12-14 DIAGNOSIS — F411 Generalized anxiety disorder: Secondary | ICD-10-CM | POA: Diagnosis present

## 2013-12-14 DIAGNOSIS — J438 Other emphysema: Secondary | ICD-10-CM | POA: Diagnosis present

## 2013-12-14 DIAGNOSIS — R059 Cough, unspecified: Secondary | ICD-10-CM

## 2013-12-14 DIAGNOSIS — A414 Sepsis due to anaerobes: Secondary | ICD-10-CM | POA: Diagnosis present

## 2013-12-14 DIAGNOSIS — E2749 Other adrenocortical insufficiency: Secondary | ICD-10-CM | POA: Diagnosis present

## 2013-12-14 DIAGNOSIS — Z8701 Personal history of pneumonia (recurrent): Secondary | ICD-10-CM | POA: Diagnosis not present

## 2013-12-14 DIAGNOSIS — Z22322 Carrier or suspected carrier of Methicillin resistant Staphylococcus aureus: Secondary | ICD-10-CM | POA: Diagnosis not present

## 2013-12-14 DIAGNOSIS — R05 Cough: Secondary | ICD-10-CM

## 2013-12-14 DIAGNOSIS — D72829 Elevated white blood cell count, unspecified: Secondary | ICD-10-CM | POA: Diagnosis present

## 2013-12-14 DIAGNOSIS — I739 Peripheral vascular disease, unspecified: Secondary | ICD-10-CM | POA: Diagnosis present

## 2013-12-14 DIAGNOSIS — R6521 Severe sepsis with septic shock: Secondary | ICD-10-CM

## 2013-12-14 DIAGNOSIS — R911 Solitary pulmonary nodule: Secondary | ICD-10-CM | POA: Diagnosis not present

## 2013-12-14 DIAGNOSIS — D649 Anemia, unspecified: Secondary | ICD-10-CM

## 2013-12-14 DIAGNOSIS — S31109A Unspecified open wound of abdominal wall, unspecified quadrant without penetration into peritoneal cavity, initial encounter: Secondary | ICD-10-CM | POA: Diagnosis present

## 2013-12-14 DIAGNOSIS — E860 Dehydration: Secondary | ICD-10-CM

## 2013-12-14 DIAGNOSIS — D638 Anemia in other chronic diseases classified elsewhere: Secondary | ICD-10-CM | POA: Diagnosis present

## 2013-12-14 DIAGNOSIS — R0603 Acute respiratory distress: Secondary | ICD-10-CM

## 2013-12-14 DIAGNOSIS — K567 Ileus, unspecified: Secondary | ICD-10-CM

## 2013-12-14 LAB — COMPREHENSIVE METABOLIC PANEL WITH GFR
ALT: 10 U/L (ref 0–35)
AST: 17 U/L (ref 0–37)
Albumin: 2 g/dL — ABNORMAL LOW (ref 3.5–5.2)
Alkaline Phosphatase: 306 U/L — ABNORMAL HIGH (ref 39–117)
Anion gap: 16 — ABNORMAL HIGH (ref 5–15)
BUN: 20 mg/dL (ref 6–23)
CO2: 13 meq/L — ABNORMAL LOW (ref 19–32)
Calcium: 4.8 mg/dL — CL (ref 8.4–10.5)
Chloride: 101 meq/L (ref 96–112)
Creatinine, Ser: 1.38 mg/dL — ABNORMAL HIGH (ref 0.50–1.10)
GFR calc Af Amer: 46 mL/min — ABNORMAL LOW (ref >90)
GFR calc non Af Amer: 39 mL/min — ABNORMAL LOW (ref >90)
Glucose, Bld: 99 mg/dL (ref 70–99)
Potassium: 5.3 meq/L (ref 3.7–5.3)
Sodium: 130 meq/L — ABNORMAL LOW (ref 137–147)
Total Bilirubin: 0.3 mg/dL (ref 0.3–1.2)
Total Protein: 5.7 g/dL — ABNORMAL LOW (ref 6.0–8.3)

## 2013-12-14 LAB — CBC WITH DIFFERENTIAL/PLATELET
Basophils Absolute: 0 K/uL (ref 0.0–0.1)
Basophils Relative: 0 % (ref 0–1)
Eosinophils Absolute: 0 K/uL (ref 0.0–0.7)
Eosinophils Relative: 0 % (ref 0–5)
HCT: 22.3 % — ABNORMAL LOW (ref 36.0–46.0)
Hemoglobin: 7.4 g/dL — ABNORMAL LOW (ref 12.0–15.0)
Lymphocytes Relative: 16 % (ref 12–46)
Lymphs Abs: 2.6 K/uL (ref 0.7–4.0)
MCH: 31 pg (ref 26.0–34.0)
MCHC: 33.2 g/dL (ref 30.0–36.0)
MCV: 93.3 fL (ref 78.0–100.0)
Monocytes Absolute: 3.5 K/uL — ABNORMAL HIGH (ref 0.1–1.0)
Monocytes Relative: 21 % — ABNORMAL HIGH (ref 3–12)
Neutro Abs: 10.4 K/uL — ABNORMAL HIGH (ref 1.7–7.7)
Neutrophils Relative %: 63 % (ref 43–77)
Platelets: 417 K/uL — ABNORMAL HIGH (ref 150–400)
RBC: 2.39 MIL/uL — ABNORMAL LOW (ref 3.87–5.11)
RDW: 20.2 % — ABNORMAL HIGH (ref 11.5–15.5)
WBC: 16.5 K/uL — ABNORMAL HIGH (ref 4.0–10.5)

## 2013-12-14 LAB — PROTIME-INR
INR: 1.32 (ref 0.00–1.49)
Prothrombin Time: 16.4 seconds — ABNORMAL HIGH (ref 11.6–15.2)

## 2013-12-14 LAB — IRON AND TIBC
Iron: 11 ug/dL — ABNORMAL LOW (ref 42–135)
Saturation Ratios: 8 % — ABNORMAL LOW (ref 20–55)
TIBC: 131 ug/dL — ABNORMAL LOW (ref 250–470)
UIBC: 120 ug/dL — ABNORMAL LOW (ref 125–400)

## 2013-12-14 LAB — PHOSPHORUS: Phosphorus: 3.7 mg/dL (ref 2.3–4.6)

## 2013-12-14 LAB — VITAMIN B12: Vitamin B-12: 545 pg/mL (ref 211–911)

## 2013-12-14 LAB — RETICULOCYTES
RBC.: 1.81 MIL/uL — ABNORMAL LOW (ref 3.87–5.11)
RETIC COUNT ABSOLUTE: 38 10*3/uL (ref 19.0–186.0)
Retic Ct Pct: 2.1 % (ref 0.4–3.1)

## 2013-12-14 LAB — APTT: aPTT: 34 s (ref 24–37)

## 2013-12-14 LAB — MAGNESIUM: MAGNESIUM: 0.5 mg/dL — AB (ref 1.5–2.5)

## 2013-12-14 LAB — MRSA PCR SCREENING: MRSA by PCR: POSITIVE — AB

## 2013-12-14 LAB — PREPARE RBC (CROSSMATCH)

## 2013-12-14 LAB — CORTISOL: Cortisol, Plasma: 71.1 ug/dL

## 2013-12-14 LAB — FERRITIN: Ferritin: 832 ng/mL — ABNORMAL HIGH (ref 10–291)

## 2013-12-14 LAB — LACTIC ACID, PLASMA: LACTIC ACID, VENOUS: 1.8 mmol/L (ref 0.5–2.2)

## 2013-12-14 LAB — FOLATE: FOLATE: 8.7 ng/mL

## 2013-12-14 MED ORDER — OXYCODONE HCL 5 MG PO TABS
5.0000 mg | ORAL_TABLET | ORAL | Status: DC | PRN
  Administered 2013-12-14 – 2013-12-20 (×11): 5 mg via ORAL
  Filled 2013-12-14 (×11): qty 1

## 2013-12-14 MED ORDER — PANTOPRAZOLE SODIUM 40 MG PO TBEC
40.0000 mg | DELAYED_RELEASE_TABLET | Freq: Two times a day (BID) | ORAL | Status: DC
  Administered 2013-12-14 – 2013-12-15 (×3): 40 mg via ORAL
  Filled 2013-12-14 (×3): qty 1

## 2013-12-14 MED ORDER — SACCHAROMYCES BOULARDII 250 MG PO CAPS
250.0000 mg | ORAL_CAPSULE | Freq: Two times a day (BID) | ORAL | Status: DC
  Administered 2013-12-14 – 2013-12-22 (×15): 250 mg via ORAL
  Filled 2013-12-14 (×19): qty 1

## 2013-12-14 MED ORDER — DOXYCYCLINE HYCLATE 100 MG PO TABS: 100.0000 mg | ORAL_TABLET | Freq: Two times a day (BID) | ORAL | Status: DC

## 2013-12-14 MED ORDER — DILTIAZEM HCL 100 MG IV SOLR
5.0000 mg/h | Freq: Once | INTRAVENOUS | Status: AC
  Administered 2013-12-14: 5 mg/h via INTRAVENOUS

## 2013-12-14 MED ORDER — SODIUM CHLORIDE 0.9 % IV BOLUS (SEPSIS)
500.0000 mL | Freq: Once | INTRAVENOUS | Status: AC
  Administered 2013-12-14: 500 mL via INTRAVENOUS

## 2013-12-14 MED ORDER — SODIUM CHLORIDE 0.9 % IV SOLN
1.5000 mg/kg/h | INTRAVENOUS | Status: DC
  Administered 2013-12-14: 1.5 mg/kg/h via INTRAVENOUS
  Filled 2013-12-14 (×2): qty 100

## 2013-12-14 MED ORDER — CALCIUM CARBONATE ANTACID 500 MG PO CHEW
1.0000 | CHEWABLE_TABLET | Freq: Three times a day (TID) | ORAL | Status: DC
  Administered 2013-12-14 – 2013-12-17 (×11): 200 mg via ORAL
  Filled 2013-12-14 (×15): qty 1

## 2013-12-14 MED ORDER — METOPROLOL TARTRATE 25 MG PO TABS: 50.0000 mg | ORAL_TABLET | Freq: Two times a day (BID) | ORAL | Status: DC

## 2013-12-14 MED ORDER — MIDAZOLAM HCL 2 MG/2ML IJ SOLN
2.0000 mg | Freq: Once | INTRAMUSCULAR | Status: DC
  Filled 2013-12-14: qty 2

## 2013-12-14 MED ORDER — DIGOXIN 0.25 MG/ML IJ SOLN
0.2500 mg | Freq: Once | INTRAMUSCULAR | Status: AC
  Administered 2013-12-14: 0.25 mg via INTRAVENOUS
  Filled 2013-12-14: qty 1

## 2013-12-14 MED ORDER — DIGOXIN 0.25 MG/ML IJ SOLN
0.1250 mg | Freq: Every day | INTRAMUSCULAR | Status: DC
Start: 2013-12-15 — End: 2013-12-22
  Administered 2013-12-15 – 2013-12-21 (×7): 0.125 mg via INTRAVENOUS
  Filled 2013-12-14 (×9): qty 0.5

## 2013-12-14 MED ORDER — AZTREONAM 2 G IJ SOLR
2.0000 g | Freq: Three times a day (TID) | INTRAMUSCULAR | Status: DC
  Administered 2013-12-14 – 2013-12-15 (×4): 2 g via INTRAVENOUS
  Filled 2013-12-14 (×5): qty 2

## 2013-12-14 MED ORDER — ESTRADIOL 2 MG PO TABS
2.0000 mg | ORAL_TABLET | Freq: Every day | ORAL | Status: DC
  Administered 2013-12-14 – 2013-12-22 (×9): 2 mg via ORAL
  Filled 2013-12-14 (×10): qty 1

## 2013-12-14 MED ORDER — ACETAMINOPHEN 325 MG PO TABS
650.0000 mg | ORAL_TABLET | Freq: Four times a day (QID) | ORAL | Status: DC | PRN
  Filled 2013-12-14: qty 2

## 2013-12-14 MED ORDER — FENTANYL CITRATE 0.05 MG/ML IJ SOLN
50.0000 ug | Freq: Once | INTRAMUSCULAR | Status: DC
  Filled 2013-12-14: qty 2

## 2013-12-14 MED ORDER — METOPROLOL TARTRATE 1 MG/ML IV SOLN
2.5000 mg | Freq: Four times a day (QID) | INTRAVENOUS | Status: DC | PRN
  Administered 2013-12-15 – 2013-12-16 (×2): 2.5 mg via INTRAVENOUS
  Filled 2013-12-14 (×2): qty 5

## 2013-12-14 MED ORDER — SODIUM CHLORIDE 0.9 % IV SOLN
INTRAVENOUS | Status: DC
  Administered 2013-12-14: 04:00:00 via INTRAVENOUS

## 2013-12-14 MED ORDER — ONDANSETRON HCL 4 MG/2ML IJ SOLN: 4.0000 mg | Freq: Once | INTRAMUSCULAR | Status: DC

## 2013-12-14 MED ORDER — DOXYCYCLINE HYCLATE 100 MG IV SOLR
100.0000 mg | Freq: Two times a day (BID) | INTRAVENOUS | Status: DC
  Administered 2013-12-14 – 2013-12-15 (×3): 100 mg via INTRAVENOUS
  Filled 2013-12-14 (×3): qty 100

## 2013-12-14 MED ORDER — ASPIRIN EC 325 MG PO TBEC
325.0000 mg | DELAYED_RELEASE_TABLET | Freq: Every morning | ORAL | Status: DC
  Administered 2013-12-14 – 2013-12-22 (×9): 325 mg via ORAL
  Filled 2013-12-14 (×10): qty 1

## 2013-12-14 MED ORDER — ENOXAPARIN SODIUM 40 MG/0.4ML ~~LOC~~ SOLN
40.0000 mg | SUBCUTANEOUS | Status: DC
  Administered 2013-12-14 – 2013-12-22 (×9): 40 mg via SUBCUTANEOUS
  Filled 2013-12-14 (×9): qty 0.4

## 2013-12-14 MED ORDER — CYCLOBENZAPRINE HCL 10 MG PO TABS
10.0000 mg | ORAL_TABLET | Freq: Three times a day (TID) | ORAL | Status: DC | PRN
  Administered 2013-12-14 (×2): 10 mg via ORAL
  Filled 2013-12-14 (×2): qty 1

## 2013-12-14 MED ORDER — MAGNESIUM SULFATE 4000MG/100ML IJ SOLN
4.0000 g | Freq: Once | INTRAMUSCULAR | Status: AC
  Administered 2013-12-14: 4 g via INTRAVENOUS
  Filled 2013-12-14: qty 100

## 2013-12-14 MED ORDER — MAGNESIUM SULFATE 40 MG/ML IJ SOLN: 2.0000 g | Freq: Once | INTRAMUSCULAR | Status: DC

## 2013-12-14 MED ORDER — CALCIUM GLUCONATE 10 % IV SOLN
1.0000 g | Freq: Once | INTRAVENOUS | Status: AC
  Administered 2013-12-14: 1 g via INTRAVENOUS
  Filled 2013-12-14: qty 10

## 2013-12-14 MED ORDER — HYDROMORPHONE HCL PF 1 MG/ML IJ SOLN: 0.5000 mg | INTRAMUSCULAR | Status: DC | PRN

## 2013-12-14 MED ORDER — ACETAMINOPHEN 650 MG RE SUPP: 650.0000 mg | Freq: Four times a day (QID) | RECTAL | Status: DC | PRN

## 2013-12-14 MED ORDER — FENTANYL CITRATE 0.05 MG/ML IJ SOLN
25.0000 ug | INTRAMUSCULAR | Status: DC | PRN
  Administered 2013-12-14 – 2013-12-22 (×31): 50 ug via INTRAVENOUS
  Administered 2013-12-22: 25 ug via INTRAVENOUS
  Filled 2013-12-14 (×34): qty 2

## 2013-12-14 MED ORDER — SODIUM CHLORIDE 0.9 % IV BOLUS (SEPSIS): 1000.0000 mL | Freq: Once | INTRAVENOUS | Status: DC

## 2013-12-14 MED ORDER — SODIUM CHLORIDE 0.9 % IV SOLN: Freq: Once | INTRAVENOUS | Status: DC

## 2013-12-14 MED ORDER — ONDANSETRON HCL 4 MG PO TABS
4.0000 mg | ORAL_TABLET | Freq: Four times a day (QID) | ORAL | Status: DC | PRN
  Administered 2013-12-14: 4 mg via ORAL
  Filled 2013-12-14: qty 1

## 2013-12-14 MED ORDER — SUCRALFATE 1 GM/10ML PO SUSP
1.0000 g | Freq: Three times a day (TID) | ORAL | Status: DC
  Administered 2013-12-14 – 2013-12-16 (×7): 1 g via ORAL
  Filled 2013-12-14 (×7): qty 10

## 2013-12-14 MED ORDER — BUPROPION HCL ER (XL) 300 MG PO TB24
300.0000 mg | ORAL_TABLET | Freq: Every day | ORAL | Status: DC
  Administered 2013-12-14: 300 mg via ORAL
  Filled 2013-12-14: qty 1

## 2013-12-14 MED ORDER — DILTIAZEM HCL 100 MG IV SOLR
5.0000 mg/h | INTRAVENOUS | Status: DC
  Filled 2013-12-14: qty 100

## 2013-12-14 MED ORDER — ONDANSETRON HCL 4 MG/2ML IJ SOLN
4.0000 mg | Freq: Four times a day (QID) | INTRAMUSCULAR | Status: DC | PRN
  Administered 2013-12-14 – 2013-12-22 (×22): 4 mg via INTRAVENOUS
  Filled 2013-12-14 (×24): qty 2

## 2013-12-14 MED ORDER — LIP MEDEX EX OINT
TOPICAL_OINTMENT | CUTANEOUS | Status: DC | PRN
  Filled 2013-12-14: qty 7

## 2013-12-14 MED ORDER — DEXTROSE 5 % IV SOLN
2.0000 ug/min | Freq: Once | INTRAVENOUS | Status: DC
  Filled 2013-12-14: qty 4

## 2013-12-14 MED ORDER — SODIUM CHLORIDE 0.9 % IV SOLN: INTRAVENOUS | Status: DC

## 2013-12-14 MED ORDER — ONDANSETRON HCL 4 MG/2ML IJ SOLN: 4.0000 mg | Freq: Three times a day (TID) | INTRAMUSCULAR | Status: DC | PRN

## 2013-12-14 MED ORDER — SODIUM BICARBONATE 8.4 % IV SOLN
INTRAVENOUS | Status: DC
  Administered 2013-12-14 – 2013-12-19 (×10): via INTRAVENOUS
  Filled 2013-12-14 (×21): qty 150

## 2013-12-14 MED ORDER — SODIUM CHLORIDE 0.9 % IV BOLUS (SEPSIS)
1000.0000 mL | Freq: Once | INTRAVENOUS | Status: AC
  Administered 2013-12-14: 1000 mL via INTRAVENOUS

## 2013-12-14 MED ORDER — PANTOPRAZOLE SODIUM 40 MG PO TBEC: 40.0000 mg | DELAYED_RELEASE_TABLET | Freq: Every day | ORAL | Status: DC

## 2013-12-14 MED ORDER — DILTIAZEM HCL 25 MG/5ML IV SOLN
10.0000 mg | Freq: Once | INTRAVENOUS | Status: AC
  Administered 2013-12-14: 10 mg via INTRAVENOUS
  Filled 2013-12-14: qty 5

## 2013-12-14 MED ORDER — CETYLPYRIDINIUM CHLORIDE 0.05 % MT LIQD
7.0000 mL | Freq: Two times a day (BID) | OROMUCOSAL | Status: DC
  Administered 2013-12-14 – 2013-12-22 (×15): 7 mL via OROMUCOSAL

## 2013-12-14 NOTE — Progress Notes (Signed)
[facility] PROGRESS NOTE  [name] ALP:[id number] DOB: [birth year] DOA: 12/14/2013 PCP: Darlin Coco, MD  Assessment/Plan: 1. Hypovolemia dehydration/Shock/ Hypotension- due to High Output Ostomy  -continue IVFs for Fluid Resuscitation  -central line in place and will target 8-10 on CVP -PCCM on board and helping with management -with follow clinical response   2. Atrial fibrillation with rapid ventricular response  -Due to #1  -continue IVFs, metoprolol, cardizem and digoxin -heart rate in the 120's  3. Hypocalcemia- due to loss from [employer] and poor nutrition -IV Calcium Gluconate given -continue tums -follow electrolytes  4. 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 CMML (chronic myelomonocytic leukemia)  -stable  -continue follow up with oncology service -given drop in Hgb will transfuse 1 unit of PRBC  6. Open abdominal wall wound  -Wound care evaluation   7. Metabolic acidosis- Due to High Output Ostomy, and Resolving Infection ( on Doxycycline)  -Change Doxycycline to IV due to high transit through GI  -given elevated WBC's and recent hospital admission with lungs infiltrates will add aztreonam  8. COPD (chronic obstructive pulmonary disease)  -Monitor O2 Sats and Albuterol Nebs PRN  -prn oxygen supplementation  9. Anemia of chronic disease -  -Send Anemia panel   DVT: lovenox  Code Status: Full Family Communication: daughter at bedside Disposition Plan: to be determine. remains in stepdown   Consultants:  PCCM  Procedures:  See below for x-ray reports   Antibiotics: Doxycycline 8/29 Aztreonam 8/29  HPI/Subjective: Feeling ok. Complaining of ?HA's and is requesting to eat something. Patient continue to have high output from ileostomy and is complaining of abd pain.  Objective: Filed Vitals:   12/14/13 0800   BP: 108/65  Pulse:   Temp:   Resp:     Intake/Output Summary (Last 24 hours) at 12/14/13 0834 Last data filed at 12/14/13 0610  Gross per 24 hour  Intake 139.19 ml  Output      0 ml  Net 139.19 ml   Filed Weights   12/14/13 0404  Weight: 65.8 kg (145 lb 1 oz)    Exam:   General:  Shivering, complaining of chills; denies CP, vomiting, melena and hematochezia. High output from ileostomy continues. Patient complaining of HA.  Cardiovascular:irregular, no rubs or gallops  Respiratory: no wheezing, no crackles  Abdomen: soft, No distended, ileostomy in place (plenty of output); positive BS  Musculoskeletal: no swelling or erythema  Data Reviewed: Basic Metabolic Panel:  Recent Labs Lab 12/14/13 0215  NA 130*  K 5.3  CL 101  CO2 13*  GLUCOSE 99  BUN 20  CREATININE 1.38*  CALCIUM 4.8*   Liver Function Tests:  Recent Labs Lab 12/14/13 0215  AST 17  ALT 10  ALKPHOS 306*  BILITOT 0.3  PROT 5.7*  ALBUMIN 2.0*   CBC:  Recent Labs Lab 12/14/13 0215  WBC 16.5*  NEUTROABS 10.4*  HGB 7.4*  HCT 22.3*  MCV 93.3  PLT 417*   BNP (last 3 results)  Recent Labs  03/24/13 2031 03/25/13 0334 03/26/13 0414  PROBNP 9254.0* 9403.0* 4535.0*   CBG: No results found for this basename: GLUCAP,  in the last 168 hours  Recent Results (from the past 240 hour(s))  TECHNOLOGIST REVIEW     Status: None   Collection Time    12/06/13  9:04 AM      Result Value Ref  Range Status   Technologist Review Rare myelocyte   Final  MRSA PCR SCREENING     Status: Abnormal   Collection Time    12/14/13  5:30 AM      Result Value Ref Range Status   MRSA by PCR POSITIVE (*) NEGATIVE Final   Comment:            The GeneXpert MRSA Assay (FDA     approved for NASAL specimens     only), is one component of a     comprehensive MRSA colonization     surveillance program. It is not     intended to diagnose MRSA     infection nor to guide or     monitor treatment for      MRSA infections.     RESULT CALLED TO, READ BACK BY AND VERIFIED WITH:     Wallene Huh [id number] @ 0808 BY J SCOTTON     Studies: Dg Chest Port 1 View  12/14/2013   CLINICAL DATA:  Status post central line placed  EXAM: PORTABLE CHEST - 1 VIEW  COMPARISON:  12/14/2013 217 hr  FINDINGS: The left jugular central line is been placed with the catheter tip at the cavoatrial junction. No pneumothorax is noted. Cardiac shadow is stable. The lungs are well aerated. Very minimal right basilar atelectasis is noted. No bony abnormality is seen.  IMPRESSION: No pneumothorax following central line placement.  Minimal right basilar atelectasis.   Electronically Signed   By: Inez Catalina M.D.   On: 12/14/2013 07:42   Dg Chest Port 1 View  12/14/2013   CLINICAL DATA:  Chest pain  EXAM: PORTABLE CHEST - 1 VIEW  COMPARISON:  Prior radiograph from 11/20/2013  FINDINGS: Heart size is stable from prior exam, and remains within normal limits. Tortuosity of the intrathoracic aorta noted.  The lungs are normally inflated. Diffuse prominence of the interstitial markings is stable. No airspace consolidation, pleural effusion, or pulmonary edema is identified. There is no pneumothorax.  No acute osseous abnormality identified.  IMPRESSION: No acute cardiopulmonary abnormality.   Electronically Signed   By: Jeannine Boga M.D.   On: 12/14/2013 02:45   Dg Abd Portable 2v  12/14/2013   CLINICAL DATA:  Abdominal distention.  Dizziness.  Nausea.  EXAM: PORTABLE ABDOMEN - 2 VIEW  COMPARISON:  One-view abdomen 08/24/2013.  FINDINGS: The stent loops of large and small bowel are again seen. Fluid levels are present on the decubitus image. There is no free air. Degenerative changes are present in the thoracolumbar spine with stable scoliosis.  IMPRESSION: 1. Ten loops of large and small bowel with fluid levels but no obstruction. The finding is most compatible with an adynamic ileus. 2. No free air. 3. Stable degenerative change in  scoliosis in the lumbar spine.   Electronically Signed   By: Lawrence Santiago M.D.   On: 12/14/2013 02:39    Scheduled Meds: . sodium chloride   Intravenous Once  . antiseptic oral rinse  7 mL Mouth Rinse BID  . aspirin  325 mg Oral q morning - 10a  . buPROPion  300 mg Oral Daily  . calcium carbonate  1 tablet Oral TID  . [START ON 12/15/2013] digoxin  0.125 mg Intravenous Daily  . digoxin  0.25 mg Intravenous Once  . digoxin  0.25 mg Intravenous Once  . doxycycline (VIBRAMYCIN) IV  100 mg Intravenous Q12H  . enoxaparin (LOVENOX) injection  40 mg Subcutaneous Q24H  . estradiol  2 mg Oral Daily  . metoprolol  50 mg Oral BID  . pantoprazole  40 mg Oral BID  . saccharomyces boulardii  250 mg Oral BID  . sodium chloride  1,000 mL Intravenous Once  . sucralfate  1 g Oral TID WC & HS   Continuous Infusions: . sodium chloride    . calcium gluconate infusion(930 mg elemental calcium/L) 1.5 mg elemental calcium/kg/hr (12/14/13 0446)  . diltiazem (CARDIZEM) infusion Stopped (12/14/13 0500)  .  sodium bicarbonate  infusion 1000 mL 50 mL/hr at 12/14/13 8786    Principal Problem:   Hypovolemia dehydration Active Problems:   Atrial fibrillation with rapid ventricular response   Hypotension   Chronic hyponatremia   CMML (chronic myelomonocytic leukemia)   Open abdominal wall wound   Hypocalcemia   Metabolic acidosis   MRSA carrier   COPD (chronic obstructive pulmonary disease)   Anemia of chronic disease   Leukocytosis, unspecified   Shock   Nausea    Time spent: >30 minutes    Barton Dubois  [facility] Pager [phone]. If 7PM-7AM, please contact night-coverage at [email or web address], password [facility] 12/14/2013, 8:34 AM  LOS: 0 days

## 2013-12-14 NOTE — Consult Note (Addendum)
PULMONARY  / CRITICAL CARE MEDICINE CONSULTATION   Name: [name] MRN: [id number] DOB: 1950/11/10    ADMISSION DATE:  12/14/2013 CONSULTATION DATE: December 14, 2013  REQUESTING CLINICIAN: Dr. Jana Hakim PRIMARY SERVICE: [facility]  CHIEF COMPLAINT:  Dizziness  BRIEF PATIENT DESCRIPTION: 63 y/o woman with a  History of CLL, ostomy with high output and chronic metabolic disarray who presents with nausea, poor PO intake, hypotension, and afib with RVR.  SIGNIFICANT EVENTS / STUDIES:  none  LINES / TUBES: PIVs x2  CULTURES: Pending  ANTIBIOTICS: Doxycycline  HISTORY OF PRESENT ILLNESS:   [name] is a 63 y/o woman with a history of CML, COPD, anemia, PVD with ischemic bowel s/p resection and end-ileostomy with frequent admissions for dehydration who presents with a week's history of decreased PO intake, decreased urine output, and lightheadedness. She reports abdominal tenderness. This presentation is fairly typical for her. She frequently presents with atrial fibrillation with RVR. The patient reports that she went to the cancer hospital a week ago and got IVF, but failed to take in very much PO, and feels this contributed greatly to her current state. As she became more lightheaded, she called EMS, and was taken to the ED for further management. She was found to be hypotensive and in afib with RVR, but did respond to fluid resuscitation and calcium channel blocker infusion.  PAST MEDICAL HISTORY :  Past Medical History  Diagnosis Date  . Hypertension     She has a past hx of essential  . 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 Inflammation of joint of knee     Since we last last saw her she developed problems with an acute which required surgical drainage by her orthopedist Dr. Durward Fortes.  Marland Kitchen MRSA (methicillin resistant Staphylococcus aureus)     Knee surgery drainage was positive for  MRSA and she was treated with 3 weeks of doxycycline successfully.  . Diarrhea     Mild  . Exogenous obesity   . GERD (gastroesophageal reflux disease)     2 hosp.- ischemic colitis - residual of Norovirus, 05/2011- sm. bowel obstruction  . Headache(784.0)     migraine headache on occas, less now than when she was younger   . Arthritis     L hip, back, neck   . History of blood transfusion sept 2013    04/2011- /w ischemic colitis , trouble with matching blood  sept 2013  . Anemia     will see hematology consult prior to surgery, recommended by Dr. Mare Ferrari  . Anemia 12/15/2010  . Ischemic colitis 01/31/2012  . Atrial flutter     during hospitalization, 04/2011- related to anemia & illness/stress   . Pneumonia     04/2011- not hospitalized , pt. denies SOB, changes in chest, breathing  . 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 Dizziness     occasional  . B12 deficiency 02/27/2013  . 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 COPD (chronic obstructive pulmonary disease) 09/26/2013   Past Surgical History  Procedure Laterality Date  . Knee surgery      I&D- 2008, post laceration   . Colonoscopy  05/16/2011    Procedure: COLONOSCOPY;  Surgeon: Winfield Cunas., MD;  Location: [facility];  Service: Endoscopy;  Laterality: N/A;  . Small intestine surgery  1992, 1999  . Laparotomy and lysis of adhesions    .  Total hip arthroplasty  10/25/2011    Procedure: TOTAL HIP ARTHROPLASTY;  Surgeon: Garald Balding, MD;  Location: [place];  Service: Orthopedics;  Laterality: Left;  . Appendectomy  1962  . Abdominal hysterectomy  1988  . Partial colectomy and colostomy  sept 2013    mucous fistula done  . Partial colectomy  05/17/2012    Procedure: PARTIAL COLECTOMY;  Surgeon: Odis Hollingshead, MD;  Location: [facility];  Service: General;  Laterality: N/A;  . Colostomy closure  05/17/2012    Procedure: COLOSTOMY CLOSURE;  Surgeon: Odis Hollingshead, MD;  Location: [facility];  Service: General;  Laterality:  N/A;  . Laparotomy  05/17/2012    Procedure: EXPLORATORY LAPAROTOMY;  Surgeon: Odis Hollingshead, MD;  Location: [facility];  Service: General;;  . Lysis of adhesion  05/17/2012    Procedure: LYSIS OF ADHESION;  Surgeon: Odis Hollingshead, MD;  Location: [facility];  Service: General;;  . Esophageal biopsy  05/17/2012    Procedure: BIOPSY;  Surgeon: Odis Hollingshead, MD;  Location: [facility];  Service: General;;  omental biopsy  . Laparotomy  05/22/2012    Procedure: EXPLORATORY LAPAROTOMY;  Surgeon: Odis Hollingshead, MD;  Location: [facility];  Service: General;  Laterality: N/A;  DRAINAGE  INTRA-ABDOMINAL ABSCESS/LYSIS OF ADHESIONSFOR SMALL BOWEL OBSTRUCTION/DIVERTING LOOP ILEOSTOMY  . Tee without cardioversion N/A 11/25/2013    Procedure: TRANSESOPHAGEAL ECHOCARDIOGRAM (TEE);  Surgeon: Dorothy Spark, MD;  Location: [place];  Service: Cardiovascular;  Laterality: N/A;   Prior to Admission medications   Medication Sig Start Date End Date Taking? Authorizing Provider  albuterol (PROVENTIL HFA;VENTOLIN HFA) 108 (90 BASE) MCG/ACT inhaler Inhale 1-2 puffs into the lungs every 6 (six) hours as needed for wheezing or shortness of breath.    Yes Historical Provider, MD  aspirin 325 MG EC tablet Take 325 mg by mouth every morning.    Yes Historical Provider, MD  buPROPion (WELLBUTRIN XL) 300 MG 24 hr tablet Take 300 mg by mouth daily before breakfast.    Yes Historical Provider, MD  calcium carbonate (TUMS E-X 750) 750 MG chewable tablet Chew 1 tablet (750 mg total) by mouth 3 (three) times daily. 10/18/13  Yes Nishant Dhungel, MD  cyclobenzaprine (FLEXERIL) 10 MG tablet Take 10 mg by mouth 3 (three) times daily as needed for muscle spasms.  07/12/13  Yes Historical Provider, MD  diltiazem (CARDIZEM CD) 120 MG 24 hr capsule Take 1 capsule (120 mg total) by mouth daily. 11/27/13  Yes Barton Dubois, MD  doxycycline (VIBRA-TABS) 100 MG tablet Take 1 tablet (100 mg total) by mouth 2 (two) times daily. 12/10/13 01/06/14 Yes  Barton Dubois, MD  esomeprazole (NEXIUM) 40 MG capsule Take 40 mg by mouth 2 (two) times daily before a meal.    Yes Historical Provider, MD  estradiol (ESTRACE) 2 MG tablet Take 2 mg by mouth daily before breakfast.    Yes Historical Provider, MD  HYDROmorphone (DILAUDID) 2 MG tablet Take 2-4 mg by mouth every 6 (six) hours as needed for severe pain.   Yes Historical Provider, MD  metoprolol (LOPRESSOR) 50 MG tablet Take 1 tablet (50 mg total) by mouth 2 (two) times daily. 11/27/13  Yes Barton Dubois, MD  ondansetron (ZOFRAN) 4 MG tablet Take 4 mg by mouth every 8 (eight) hours as needed for nausea or vomiting.   Yes Historical Provider, MD  promethazine (PHENERGAN) 25 MG tablet Take 25 mg by mouth every 6 (six) hours as needed for nausea  or vomiting (nausea).   Yes Historical Provider, MD  saccharomyces boulardii (FLORASTOR) 250 MG capsule Take 1 capsule (250 mg total) by mouth 2 (two) times daily. 11/27/13  Yes Barton Dubois, MD  loratadine-pseudoephedrine (CLARITIN-D 12-HOUR) 5-120 MG per tablet Take 1 tablet by mouth daily as needed for allergies.     Historical Provider, MD   Allergies  Allergen Reactions  . Vancomycin Hives and Rash    wheezing  . Ativan [Lorazepam] Other (See Comments)    "makes me crazy"  . Codeine Nausea And Vomiting  . Tetanus Toxoids Other (See Comments)    Serum reaction  . Penicillins Hives and Rash  . Xarelto [Rivaroxaban] Hives and Rash    (May not be allergic. Vancomycin was also being taken when reaction occurred.)    FAMILY HISTORY:  Family History  Problem Relation Age of Onset  . Stroke Father   . Atrial fibrillation Father   . Hypertension Mother    SOCIAL HISTORY:  reports that she quit smoking about 8 months ago. Her smoking use included Cigarettes. She has a 5 pack-year smoking history. She has never used smokeless tobacco. She reports that she does not drink alcohol or use illicit drugs.  REVIEW OF SYSTEMS:  10 systems were reviewed and are  negative except as noted in the HPI.  SUBJECTIVE:   VITAL SIGNS: Temp:  [97.9 F (36.6 C)] 97.9 F (36.6 C) (08/29 0142) Pulse Rate:  [85-130] 85 (08/29 0142) Resp:  [21] 21 (08/29 0142) BP: (60-82)/(40-64) 82/64 mmHg (08/29 0429) Weight:  [145 lb 1 oz (65.8 kg)] 145 lb 1 oz (65.8 kg) (08/29 0404) HEMODYNAMICS:   VENTILATOR SETTINGS:   INTAKE / OUTPUT: Intake/Output   None     PHYSICAL EXAMINATION: General:  Chronically ill appearing woman appearing older than stated age. Neuro:  Shaking, but grossly intact. HEENT: Dry mucus membtraaes Neck: No JVD Cardiovascular: No LAD* Abdomen:  Moderate distended, TTP. Musculoskeletal:  No edema Skin:  Per RN documentation.  LABS:  CBC  Recent Labs Lab 12/14/13 0215  WBC 16.5*  HGB 7.4*  HCT 22.3*  PLT 417*   Coag's  Recent Labs Lab 12/14/13 0215  APTT 34  INR 1.32   BMET  Recent Labs Lab 12/14/13 0215  NA 130*  K 5.3  CL 101  CO2 13*  BUN 20  CREATININE 1.38*  GLUCOSE 99   Electrolytes  Recent Labs Lab 12/14/13 0215  CALCIUM 4.8*   Sepsis Markers  Recent Labs Lab 12/14/13 0215  LATICACIDVEN 1.8   ABG No results found for this basename: PHART, PCO2ART, PO2ART,  in the last 168 hours Liver Enzymes  Recent Labs Lab 12/14/13 0215  AST 17  ALT 10  ALKPHOS 306*  BILITOT 0.3  ALBUMIN 2.0*   Cardiac Enzymes No results found for this basename: TROPONINI, PROBNP,  in the last 168 hours Glucose No results found for this basename: GLUCAP,  in the last 168 hours  Imaging Dg Chest Port 1 View  12/14/2013   CLINICAL DATA:  Chest pain  EXAM: PORTABLE CHEST - 1 VIEW  COMPARISON:  Prior radiograph from 11/20/2013  FINDINGS: Heart size is stable from prior exam, and remains within normal limits. Tortuosity of the intrathoracic aorta noted.  The lungs are normally inflated. Diffuse prominence of the interstitial markings is stable. No airspace consolidation, pleural effusion, or pulmonary edema is  identified. There is no pneumothorax.  No acute osseous abnormality identified.  IMPRESSION: No acute cardiopulmonary abnormality.   Electronically  Signed   By: Jeannine Boga M.D.   On: 12/14/2013 02:45   Dg Abd Portable 2v  12/14/2013   CLINICAL DATA:  Abdominal distention.  Dizziness.  Nausea.  EXAM: PORTABLE ABDOMEN - 2 VIEW  COMPARISON:  One-view abdomen 08/24/2013.  FINDINGS: The stent loops of large and small bowel are again seen. Fluid levels are present on the decubitus image. There is no free air. Degenerative changes are present in the thoracolumbar spine with stable scoliosis.  IMPRESSION: 1. Ten loops of large and small bowel with fluid levels but no obstruction. The finding is most compatible with an adynamic ileus. 2. No free air. 3. Stable degenerative change in scoliosis in the lumbar spine.   Electronically Signed   By: Lawrence Santiago M.D.   On: 12/14/2013 02:39    EKG: Afib with RVR CXR: Low lung volumes, no infiltrate.  ASSESSMENT / PLAN:  PULMONARY A: Metabolic Acidosis generally non-gap -- appears to be compensating well. P:   No pulmonary interventions requied hat.  CARDIOVASCULAR A: Hypovolemic Hypotension Atrial Fibrillation with RVR P:   Agree with plan for volume resuscitation; will need to be cautious with crystaloid so as not to worsen NAGMA.  RENAL A: AKI Hypocalcemia P:   Due to dehydration; will treat with IVF. Replete lytes as necessary. Given NAGMA, plan for bicarb to help her nongap acidosis.  GASTROINTESTINAL A: Functional ileus presenting with nausea, and abdomenal distension P:   Avoid agents that are likely to worsen ileus (e.g. nartcotics)  HEMATOLOGIC A: Anemia CML P:  Pt's counts are somehwat elevated, more anemic than usual. Touch base with heme; perhaps consider a BM biopsy. Patient has elevated reticulocytic index.  INFECTIOUS A: Agree w/ empiric treatment of sepsis in the absence of compelling argument. P:   Vanc /  zoysn per pharmacy.  ENDOCRINE A: No acute events. P:    NEUROLOGIC A: NO acute events P:  TODAY'S SUMMARY: 63 y/o woman with chronic ileostomy, frequent admissions for dehydration, presenting with AKI, and AF with RVR and 3 days of lightheadedness and dizziness, found to be hypotensive, but responded to IVF.  I have personally obtained a history, examined the patient, evaluated laboratory and imaging results, formulated the assessment and plan and placed orders.  CRITICAL CARE: The patient is critically ill with multiple organ systems failure and requires high complexity decision making for assessment and support, frequent evaluation and titration of therapies, application of advanced monitoring technologies and extensive interpretation of multiple databases. Critical Care Time devoted to patient care services described in this note is 45 minutes.   Luz Brazen, MD Pulmonary & Critical Care Medicine December 14, 2013, 5:09 AM \  12/14/2013, 4:36 AM

## 2013-12-14 NOTE — Progress Notes (Signed)
ANTIBIOTIC CONSULT NOTE - INITIAL  Pharmacy Consult for Aztreonam Indication: rule out sepsis  Allergies  Allergen Reactions  . Vancomycin Hives and Rash    wheezing  . Ativan [Lorazepam] Other (See Comments)    "makes me crazy"  . Codeine Nausea And Vomiting  . Tetanus Toxoids Other (See Comments)    Serum reaction  . Penicillins Hives and Rash  . Xarelto [Rivaroxaban] Hives and Rash    (May not be allergic. Vancomycin was also being taken when reaction occurred.)    Patient Measurements: Height: 5\' 5"  (165.1 cm) Weight: 145 lb 1 oz (65.8 kg) IBW/kg (Calculated) : 57  Vital Signs: Temp: 98.6 F (37 C) (08/29 0830) Temp src: Oral (08/29 0830) BP: 96/58 mmHg (08/29 0859) Pulse Rate: 117 (08/29 0600) Intake/Output from previous day: 08/28 0701 - 08/29 0700 In: 139.2 [I.V.:130.9; IV Piggyback:8.3] Out: -  Intake/Output from this shift:    Labs:  Recent Labs  12/14/13 0215  WBC 16.5*  HGB 7.4*  PLT 417*  CREATININE 1.38*   Estimated Creatinine Clearance: 37.1 ml/min (by C-G formula based on Cr of 1.38). No results found for this basename: VANCOTROUGH, Corlis Leak, VANCORANDOM, GENTTROUGH, GENTPEAK, GENTRANDOM, TOBRATROUGH, TOBRAPEAK, TOBRARND, AMIKACINPEAK, AMIKACINTROU, AMIKACIN,  in the last 72 hours   Microbiology: Recent Results (from the past 720 hour(s))  CULTURE, BLOOD (ROUTINE X 2)     Status: None   Collection Time    11/18/13 11:25 PM      Result Value Ref Range Status   Specimen Description BLOOD L ARM   Final   Special Requests BOTTLES DRAWN AEROBIC AND ANAEROBIC 5CC   Final   Culture  Setup Time     Final   Value: 11/19/2013 03:53     Performed at [facility]   Culture     Final   Value: NO GROWTH 5 DAYS     Performed at [facility]   Report Status 11/25/2013 FINAL   Final  CULTURE, BLOOD (ROUTINE X 2)     Status: None   Collection Time    11/18/13 11:30 PM      Result Value Ref Range Status   Specimen Description BLOOD  LEFT ANTECUBITAL   Final   Special Requests BOTTLES DRAWN AEROBIC AND ANAEROBIC 5CC   Final   Culture  Setup Time     Final   Value: 11/19/2013 03:54     Performed at [facility]   Culture     Final   Value: NO GROWTH 5 DAYS     Performed at [facility]   Report Status 11/25/2013 FINAL   Final  MRSA PCR SCREENING     Status: Abnormal   Collection Time    11/19/13  2:45 AM      Result Value Ref Range Status   MRSA by PCR POSITIVE (*) NEGATIVE Final   Comment:            The GeneXpert MRSA Assay (FDA     approved for NASAL specimens     only), is one component of a     comprehensive MRSA colonization     surveillance program. It is not     intended to diagnose MRSA     infection nor to guide or     monitor treatment for     MRSA infections.     RESULT CALLED TO, READ BACK BY AND VERIFIED WITH:     JOHNSON RN AT 0510 ON 08.04.15 BY  SHUEA  URINE CULTURE     Status: None   Collection Time    11/19/13  8:49 AM      Result Value Ref Range Status   Specimen Description URINE, CATHETERIZED   Final   Special Requests NONE   Final   Culture  Setup Time     Final   Value: 11/19/2013 11:10     Performed at [facility]     Final   Value: >=100,000 COLONIES/ML     Performed at [facility]   Culture     Final   Value: METHICILLIN RESISTANT STAPHYLOCOCCUS AUREUS     Note: RIFAMPIN AND GENTAMICIN SHOULD NOT BE USED AS SINGLE DRUGS FOR TREATMENT OF STAPH INFECTIONS. CRITICAL RESULT CALLED TO, READ BACK BY AND VERIFIED WITH: STEPHANIE DILLON @ 8:31AM 11/21/13 BY DWEEKS     Performed at [facility]   Report Status 11/21/2013 FINAL   Final   Organism ID, Bacteria METHICILLIN RESISTANT STAPHYLOCOCCUS AUREUS   Final  CULTURE, BLOOD (ROUTINE X 2)     Status: None   Collection Time    11/21/13  9:50 AM      Result Value Ref Range Status   Specimen Description BLOOD LEFT HAND   Final   Special Requests BOTTLES DRAWN AEROBIC AND ANAEROBIC  [facility] EACH   Final   Culture  Setup Time     Final   Value: 11/21/2013 13:23     Performed at [facility]   Culture     Final   Value: NO GROWTH 5 DAYS     Performed at [facility]   Report Status 11/27/2013 FINAL   Final  CULTURE, BLOOD (ROUTINE X 2)     Status: None   Collection Time    11/21/13  9:50 AM      Result Value Ref Range Status   Specimen Description BLOOD RIGHT ARM   Final   Special Requests BOTTLES DRAWN AEROBIC AND ANAEROBIC 5 CC EACH   Final   Culture  Setup Time     Final   Value: 11/21/2013 13:22     Performed at [facility]   Culture     Final   Value: NO GROWTH 5 DAYS     Performed at [facility]   Report Status 11/27/2013 FINAL   Final  TECHNOLOGIST REVIEW     Status: None   Collection Time    12/06/13  9:04 AM      Result Value Ref Range Status   Technologist Review Rare myelocyte   Final  MRSA PCR SCREENING     Status: Abnormal   Collection Time    12/14/13  5:30 AM      Result Value Ref Range Status   MRSA by PCR POSITIVE (*) NEGATIVE Final   Comment:            The GeneXpert MRSA Assay (FDA     approved for NASAL specimens     only), is one component of a     comprehensive MRSA colonization     surveillance program. It is not     intended to diagnose MRSA     infection nor to guide or     monitor treatment for     MRSA infections.     RESULT CALLED TO, READ BACK BY AND VERIFIED WITH:     Wallene Huh [id number] @ [place]  Medical History: Past Medical History  Diagnosis Date  . Hypertension     She has a past hx of essential  . 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 Inflammation of joint of knee     Since we last last saw her she developed problems with an acute which required surgical drainage by her orthopedist Dr. Durward Fortes.  Marland Kitchen MRSA (methicillin resistant Staphylococcus aureus)     Knee surgery drainage was  positive for MRSA and she was treated with 3 weeks of doxycycline successfully.  . Diarrhea     Mild  . Exogenous obesity   . GERD (gastroesophageal reflux disease)     2 hosp.- ischemic colitis - residual of Norovirus, 05/2011- sm. bowel obstruction  . Headache(784.0)     migraine headache on occas, less now than when she was younger   . Arthritis     L hip, back, neck   . History of blood transfusion sept 2013    04/2011- /w ischemic colitis , trouble with matching blood  sept 2013  . Anemia     will see hematology consult prior to surgery, recommended by Dr. Mare Ferrari  . Anemia 12/15/2010  . Ischemic colitis 01/31/2012  . Atrial flutter     during hospitalization, 04/2011- related to anemia & illness/stress   . Pneumonia     04/2011- not hospitalized , pt. denies SOB, changes in chest, breathing  . 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 Dizziness     occasional  . B12 deficiency 02/27/2013  . 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 COPD (chronic obstructive pulmonary disease) 09/26/2013    Medications:  Anti-infectives   Start     Dose/Rate Route Frequency Ordered Stop   12/14/13 1000  doxycycline (VIBRA-TABS) tablet 100 mg  Status:  Discontinued     100 mg Oral 2 times daily 12/14/13 0410 12/14/13 0520   12/14/13 0600  doxycycline (VIBRAMYCIN) 100 mg in dextrose 5 % 250 mL IVPB     100 mg 125 mL/hr over 120 Minutes Intravenous Every 12 hours 12/14/13 0520       Assessment: 63yoF with CLL, ileostomy admitted for nausea/anorexia, hypotension, AF/RVR.  Has draining abdominal wound with Hx pseudomonas infection.  Also with Hx MRSA uti  On doxycycline PTA.  WBC elevated Tmax 99.27F SCr mildly elevated; CrCl ~ 40 ml/min  WCx 8/29: pending UCx 8/29: pending BCx 8/29: pending  Goal of Therapy:  Eradication of infection; appropriate renal dosing of antibiotics.  Plan:  Aztreonam 2g IV q8h (higher dose for pseudo infection, possible sepsis) Follow renal function and culture  results  Reuel Boom, PharmD [phone] 12/14/2013 10:45 AM

## 2013-12-14 NOTE — ED Provider Notes (Signed)
CSN: [id number]     Arrival date & time 12/14/13  0140 History   None    Chief Complaint  Patient presents with  . Near Syncope     (Consider location/radiation/quality/duration/timing/severity/associated sxs/prior Treatment) HPI Comments: The patient is a 63 year old female with a very complicated medical history including a history of chronic monomyelocytic leukemia, ischemic bowel disease status post colonic resection and ileostomy placement, this is a high output colostomy. She also has a history of multiple episodes of paroxysmal atrial fibrillation that seem to occur during stress. She had an echocardiogram in 2013 which was unremarkable she presents to the hospital because of near-syncope after feeling several days of increasing dehydration, an inability to keep up with her fluid losses from her ostomy, nausea vomiting. She denies chest pain but feels a rapid heart rate, has abdominal pain and distention which is at baseline per the patient. She denies any blood in her ostomy. The symptoms are persistent, gradually worsening, they are severe on arrival. Paramedics noted that the patient was likely in atrial fibrillation with a rapid response and was hypotensive at 60 systolic.Marland Kitchen    Patient is a 63 y.o. female presenting with near-syncope. The history is provided by the patient and the EMS personnel.  Near Syncope    Past Medical History  Diagnosis Date  . Hypertension     She has a past hx of essential  . 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 Inflammation of joint of knee     Since we last last saw her she developed problems with an acute which required surgical drainage by her orthopedist Dr. Durward Fortes.  Marland Kitchen MRSA (methicillin resistant Staphylococcus aureus)     Knee surgery drainage was positive for MRSA and she was treated with 3 weeks of doxycycline successfully.  . Diarrhea     Mild  . Exogenous  obesity   . GERD (gastroesophageal reflux disease)     2 hosp.- ischemic colitis - residual of Norovirus, 05/2011- sm. bowel obstruction  . Headache(784.0)     migraine headache on occas, less now than when she was younger   . Arthritis     L hip, back, neck   . History of blood transfusion sept 2013    04/2011- /w ischemic colitis , trouble with matching blood  sept 2013  . Anemia     will see hematology consult prior to surgery, recommended by Dr. Mare Ferrari  . Anemia 12/15/2010  . Ischemic colitis 01/31/2012  . Atrial flutter     during hospitalization, 04/2011- related to anemia & illness/stress   . Pneumonia     04/2011- not hospitalized , pt. denies SOB, changes in chest, breathing  . 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 Dizziness     occasional  . B12 deficiency 02/27/2013  . 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 COPD (chronic obstructive pulmonary disease) 09/26/2013   Past Surgical History  Procedure Laterality Date  . Knee surgery      I&D- 2008, post laceration   . Colonoscopy  05/16/2011    Procedure: COLONOSCOPY;  Surgeon: Winfield Cunas., MD;  Location: [facility];  Service: Endoscopy;  Laterality: N/A;  . Small intestine surgery  1992, 1999  . Laparotomy and lysis of adhesions    . Total hip arthroplasty  10/25/2011    Procedure: TOTAL HIP ARTHROPLASTY;  Surgeon: Garald Balding, MD;  Location: [facility]  OR;  Service: Orthopedics;  Laterality: Left;  . Appendectomy  1962  . Abdominal hysterectomy  1988  . Partial colectomy and colostomy  sept 2013    mucous fistula done  . Partial colectomy  05/17/2012    Procedure: PARTIAL COLECTOMY;  Surgeon: Odis Hollingshead, MD;  Location: [facility] ORS;  Service: General;  Laterality: N/A;  . Colostomy closure  05/17/2012    Procedure: COLOSTOMY CLOSURE;  Surgeon: Odis Hollingshead, MD;  Location: [facility] ORS;  Service: General;  Laterality: N/A;  . Laparotomy  05/17/2012    Procedure: EXPLORATORY LAPAROTOMY;  Surgeon: Odis Hollingshead, MD;   Location: [facility]S;  Service: General;;  . Lysis of adhesion  05/17/2012    Procedure: LYSIS OF ADHESION;  Surgeon: Odis Hollingshead, MD;  Location: [facility];  Service: General;;  . Esophageal biopsy  05/17/2012    Procedure: BIOPSY;  Surgeon: Odis Hollingshead, MD;  Location: [facility];  Service: General;;  omental biopsy  . Laparotomy  05/22/2012    Procedure: EXPLORATORY LAPAROTOMY;  Surgeon: Odis Hollingshead, MD;  Location: [facility];  Service: General;  Laterality: N/A;  DRAINAGE  INTRA-ABDOMINAL ABSCESS/LYSIS OF ADHESIONSFOR SMALL BOWEL OBSTRUCTION/DIVERTING LOOP ILEOSTOMY  . Tee without cardioversion N/A 11/25/2013    Procedure: TRANSESOPHAGEAL ECHOCARDIOGRAM (TEE);  Surgeon: Dorothy Spark, MD;  Location[facility];  Service: Cardiovascular;  Laterality: N/A;   Family History  Problem Relation Age of Onset  . Stroke Father   . Atrial fibrillation Father   . Hypertension Mother    History  Substance Use Topics  . Smoking status: Former Smoker -- 0.25 packs/day for 20 years    Types: Cigarettes    Quit date: 03/21/2013  . Smokeless tobacco: Never Used  . Alcohol Use: No     Comment: 1 - 2 glasses of wine per week   OB History   Grav Para Term Preterm Abortions TAB SAB Ect Mult Living                 Review of Systems  Cardiovascular: Positive for near-syncope.  All other systems reviewed and are negative.     Allergies  Vancomycin; Ativan; Codeine; Tetanus toxoids; Penicillins; and Xarelto  Home Medications   Prior to Admission medications   Medication Sig Start Date End Date Taking? Authorizing Provider  albuterol (PROVENTIL HFA;VENTOLIN HFA) 108 (90 BASE) MCG/ACT inhaler Inhale 1-2 puffs into the lungs every 6 (six) hours as needed for wheezing or shortness of breath.    Yes Historical Provider, MD  aspirin 325 MG EC tablet Take 325 mg by mouth every morning.    Yes Historical Provider, MD  buPROPion (WELLBUTRIN XL) 300 MG 24 hr tablet Take 300 mg by mouth daily before  breakfast.    Yes Historical Provider, MD  calcium carbonate (TUMS E-X 750) 750 MG chewable tablet Chew 1 tablet (750 mg total) by mouth 3 (three) times daily. 10/18/13  Yes Nishant Dhungel, MD  cyclobenzaprine (FLEXERIL) 10 MG tablet Take 10 mg by mouth 3 (three) times daily as needed for muscle spasms.  07/12/13  Yes Historical Provider, MD  diltiazem (CARDIZEM CD) 120 MG 24 hr capsule Take 1 capsule (120 mg total) by mouth daily. 11/27/13  Yes Barton Dubois, MD  doxycycline (VIBRA-TABS) 100 MG tablet Take 1 tablet (100 mg total) by mouth 2 (two) times daily. 12/10/13 01/06/14 Yes Barton Dubois, MD  esomeprazole (NEXIUM) 40 MG capsule Take 40 mg by mouth 2 (two) times daily before a meal.  Yes Historical Provider, MD  estradiol (ESTRACE) 2 MG tablet Take 2 mg by mouth daily before breakfast.    Yes Historical Provider, MD  HYDROmorphone (DILAUDID) 2 MG tablet Take 2-4 mg by mouth every 6 (six) hours as needed for severe pain.   Yes Historical Provider, MD  metoprolol (LOPRESSOR) 50 MG tablet Take 1 tablet (50 mg total) by mouth 2 (two) times daily. 11/27/13  Yes Barton Dubois, MD  ondansetron (ZOFRAN) 4 MG tablet Take 4 mg by mouth every 8 (eight) hours as needed for nausea or vomiting.   Yes Historical Provider, MD  promethazine (PHENERGAN) 25 MG tablet Take 25 mg by mouth every 6 (six) hours as needed for nausea or vomiting (nausea).   Yes Historical Provider, MD  saccharomyces boulardii (FLORASTOR) 250 MG capsule Take 1 capsule (250 mg total) by mouth 2 (two) times daily. 11/27/13  Yes Barton Dubois, MD  loratadine-pseudoephedrine (CLARITIN-D 12-HOUR) 5-120 MG per tablet Take 1 tablet by mouth daily as needed for allergies.     Historical Provider, MD   BP 60/41  Pulse 85  Temp(Src) 97.9 F (36.6 C) (Rectal)  Resp 21 Physical Exam  Nursing note and vitals reviewed. Constitutional: She appears distressed.  Ill appearing  HENT:  Head: Normocephalic and atraumatic.  Mouth/Throat: No  oropharyngeal exudate.  Dry mucous membranes  Eyes: EOM are normal. Pupils are equal, round, and reactive to light. Right eye exhibits no discharge. Left eye exhibits no discharge. No scleral icterus.  Pale conjunctiva  Neck: Normal range of motion. Neck supple. No JVD present. No thyromegaly present.  Cardiovascular: Regular rhythm, normal heart sounds and intact distal pulses.  Exam reveals no gallop and no friction rub.   No murmur heard. Tachycardia, slightly irregular, thready pulses peripherally  Pulmonary/Chest: Effort normal and breath sounds normal. No respiratory distress. She has no wheezes. She has no rales.  Abdominal: Soft. Bowel sounds are normal. She exhibits distension. She exhibits no mass. There is tenderness.  Diffusely moderately distended abdomen with tympanitic sounds to percussion diffusely, tenderness diffusely but no peritoneal signs  Musculoskeletal: Normal range of motion. She exhibits no edema and no tenderness.  Lymphadenopathy:    She has no cervical adenopathy.  Neurological: She is alert. Coordination normal.  Skin: Skin is warm and dry. No rash noted. No erythema.   surgical wound left of midline healing by secondary intentis left with a small patch, no surrounding erythema, no purulence, no bleeding   Psychiatric: She has a normal mood and affect. Her behavior is normal.    ED Course  Procedures (including critical care time) Labs Review Labs Reviewed  CBC WITH DIFFERENTIAL - Abnormal; Notable for the following:    WBC 16.5 (*)    RBC 2.39 (*)    Hemoglobin 7.4 (*)    HCT 22.3 (*)    RDW 20.2 (*)    Platelets 417 (*)    Monocytes Relative 21 (*)    Neutro Abs 10.4 (*)    Monocytes Absolute 3.5 (*)    All other components within normal limits  COMPREHENSIVE METABOLIC PANEL - Abnormal; Notable for the following:    Sodium 130 (*)    CO2 13 (*)    Creatinine, Ser 1.38 (*)    Calcium 4.8 (*)    Total Protein 5.7 (*)    Albumin 2.0 (*)     Alkaline Phosphatase 306 (*)    GFR calc non Af Amer 39 (*)    GFR calc Af Amer 46 (*)  Anion gap 16 (*)    All other components within normal limits  PROTIME-INR - Abnormal; Notable for the following:    Prothrombin Time 16.4 (*)    All other components within normal limits  APTT  LACTIC ACID, PLASMA  COMPREHENSIVE METABOLIC PANEL  TYPE AND SCREEN    Imaging Review Dg Chest Port 1 View  12/14/2013   CLINICAL DATA:  Chest pain  EXAM: PORTABLE CHEST - 1 VIEW  COMPARISON:  Prior radiograph from 11/20/2013  FINDINGS: Heart size is stable from prior exam, and remains within normal limits. Tortuosity of the intrathoracic aorta noted.  The lungs are normally inflated. Diffuse prominence of the interstitial markings is stable. No airspace consolidation, pleural effusion, or pulmonary edema is identified. There is no pneumothorax.  No acute osseous abnormality identified.  IMPRESSION: No acute cardiopulmonary abnormality.   Electronically Signed   By: Jeannine Boga M.D.   On: 12/14/2013 02:45   Dg Abd Portable 2v  12/14/2013   CLINICAL DATA:  Abdominal distention.  Dizziness.  Nausea.  EXAM: PORTABLE ABDOMEN - 2 VIEW  COMPARISON:  One-view abdomen 08/24/2013.  FINDINGS: The stent loops of large and small bowel are again seen. Fluid levels are present on the decubitus image. There is no free air. Degenerative changes are present in the thoracolumbar spine with stable scoliosis.  IMPRESSION: 1. Ten loops of large and small bowel with fluid levels but no obstruction. The finding is most compatible with an adynamic ileus. 2. No free air. 3. Stable degenerative change in scoliosis in the lumbar spine.   Electronically Signed   By: Lawrence Santiago M.D.   On: 12/14/2013 02:39     EKG Interpretation   Date/Time:  Saturday December 14 2013 02:11:58 EDT Ventricular Rate:  124 PR Interval:    QRS Duration: 94 QT Interval:  356 QTC Calculation: 511 R Axis:   -55 Text Interpretation:  Atrial  fibrillation with rapid ventricular response  Left anterior fascicular block Borderline low voltage, extremity leads  Abnormal R-wave progression, late transition Nonspecific T abnormalities,  lateral leads Prolonged QT interval since last tracing no significant  change Confirmed by Muhanad Torosyan  MD, Tarrin Lebow ([id number]) on 12/14/2013 2:14:48 AM      MDM   Final diagnoses:  Shock  Atrial fibrillation with RVR  Anemia, unspecified anemia type  Hypocalcemia    The patient appears critically ill, she is hypertensive, tachycardic and her EKG shows that she has atrial fibrillation with a rapid ventricular response. Peripheral IV access was obtained by ultrasound guidance by the nurse, IV fluid rehydration by bolus has been started, she is likely dehydrated, possibly has worsening anemia, will need laboratory workup including lactic acid, renal function, blood counts, imaging of her abdomen and fluid resuscitation.  Remained at the patient's bedside for over 35 minutes, the patient has persistent hypotension despite multiple fluid boluses, 3 L of normal saline given, pressure improved to 85 systolic, 10 mg of Cardizem was given with improvement of heart rate from 140's to between 80 and 90 beats per minute. She gradually trended upwards and required a Cardizem drip. She was afebrile, she did have a leukocytosis and has severe hypocalcemia. Intravenous calcium was given, ongoing fluid resuscitation, consideration of sepsis due to the patient's abdominal wound though this did not appear abscess and did not appear to be a deep tissue or deep space infection. Imaging of the abdomen showed ileus but no signs of obstruction or perforation. No signs of pneumonia on x-ray.  Dr.  deterding of the intensive care unit with consult and, the hospitalist Dr. Arnoldo Morale was counseled that and will admit the patient to the intensive.Theron Arista insertion Performed by: Johnna Acosta  Consent: Verbal consent obtained. Risks and  benefits: risks, benefits and alternatives were discussed Time out: Immediately prior to procedure a "time out" was called to verify the correct patient, procedure, equipment, support staff and site/side marked as required.  Preparation: Patient was prepped and draped in the usual sterile fashion.  Vein Location: L EJ  Not Ultrasound Guided  Gauge: 18  Normal blood return and flush without difficulty Patient tolerance: Patient tolerated the procedure well with no immediate complications.   CRITICAL CARE Performed by: Johnna Acosta Total critical care time: 35 Critical care time was exclusive of separately billable procedures and treating other patients. Critical care was necessary to treat or prevent imminent or life-threatening deterioration. Critical care was time spent personally by me on the following activities: development of treatment plan with patient and/or surrogate as well as nursing, discussions with consultants, evaluation of patient's response to treatment, examination of patient, obtaining history from patient or surrogate, ordering and performing treatments and interventions, ordering and review of laboratory studies, ordering and review of radiographic studies, pulse oximetry and re-evaluation of patient's condition.   Johnna Acosta, MD 12/14/13 0400

## 2013-12-14 NOTE — ED Notes (Signed)
Per EMS, pt from home, c/o dizziness and nausea.  Prior to arrival, pt reports near syncopal episode when she attempted to sit up.  Hx of vertigo.  Pt is A&OX 4 at this time.  Pt is hypotensive and tachy at 130s, afib.

## 2013-12-14 NOTE — H&P (Signed)
[facility] Admission History and Physical       [name] KWI:[id number] DOB: 16-Feb-1951 DOA: 12/14/2013                                   Referring physician: EDP PCP: Kari Coco, MD  Specialists:   Chief Complaint:   HPI: [name] is a 63 y.o. female with a history of Multiple Medical problems including a history of CML,  Ischemic Colitis in 01/2012 S/P small bowel resection and Ileostomy placement who presents to the ED with complaints of nausea and ABD Distension and Dizziness worsening over the past week.    She has an high output Ostomy and has recurrent hospitalizations for bouts of dehydration and electrolyte derangements.    She denies any fevers or chills.  She was hospitalized recently for MRSA UTI and was placed on oral doxycycline to continue as an outpatient.       Review of Systems:  Constitutional: No Weight Loss, No Weight Gain, Night Sweats, Fevers, Chills, Dizziness, Fatigue, or Generalized Weakness HEENT: No Headaches, Difficulty Swallowing,Tooth/Dental Problems,Sore Throat,  No Sneezing, Rhinitis, Ear Ache, Nasal Congestion, or Post Nasal Drip,  Cardio-vascular:  No Chest pain, Orthopnea, PND, Edema in Lower Extremities, Anasarca, Dizziness, Palpitations  Resp: No Dyspnea, No DOE, No Productive Cough, No Non-Productive Cough, No Hemoptysis, No Wheezing.    GI: No Heartburn, Indigestion, Abdominal Pain, Nausea, Vomiting, Diarrhea, Hematemesis, Hematochezia, Melena, Change in Bowel Habits,  Loss of Appetite  GU: No Dysuria, Change in Color of Urine, No Urgency or Frequency, No Flank pain.  Musculoskeletal: No Joint Pain or Swelling, No Decreased Range of Motion, No Back Pain.  Neurologic: No Syncope, No Seizures, Muscle Weakness, Paresthesia, Vision Disturbance or Loss, No Diplopia, No Vertigo, No Difficulty Walking,  Skin: No Rash or Lesions. Psych: No Change in Mood or Affect, No Depression or Anxiety, No Memory loss, No Confusion, or  Hallucinations   Past Medical History  Diagnosis Date  . Hypertension     She has a past hx of essential  . 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 Inflammation of joint of knee     Since we last last saw her she developed problems with an acute which required surgical drainage by her orthopedist Dr. Durward Fortes.  Marland Kitchen MRSA (methicillin resistant Staphylococcus aureus)     Knee surgery drainage was positive for MRSA and she was treated with 3 weeks of doxycycline successfully.  . Diarrhea     Mild  . Exogenous obesity   . GERD (gastroesophageal reflux disease)     2 hosp.- ischemic colitis - residual of Norovirus, 05/2011- sm. bowel obstruction  . Headache(784.0)     migraine headache on occas, less now than when she was younger   . Arthritis     L hip, back, neck   . History of blood transfusion sept 2013    04/2011- /w ischemic colitis , trouble with matching blood  sept 2013  . Anemia     will see hematology consult prior to surgery, recommended by Dr. Mare Ferrari  . Anemia 12/15/2010  . Ischemic colitis 01/31/2012  . Atrial flutter     during hospitalization, 04/2011- related to anemia & illness/stress   . Pneumonia     04/2011- not hospitalized , pt. denies SOB, changes in chest, breathing  .  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 Dizziness     occasional  . B12 deficiency 02/27/2013  . 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 COPD (chronic obstructive pulmonary disease) 09/26/2013      Past Surgical History  Procedure Laterality Date  . Knee surgery      I&D- 2008, post laceration   . Colonoscopy  05/16/2011    Procedure: COLONOSCOPY;  Surgeon: Winfield Cunas., MD;  Location: [facility];  Service: Endoscopy;  Laterality: N/A;  . Small intestine surgery  1992, 1999  . Laparotomy and lysis of adhesions    . Total hip arthroplasty  10/25/2011    Procedure: TOTAL HIP ARTHROPLASTY;  Surgeon: Garald Balding, MD;  Location: [facility];  Service: Orthopedics;  Laterality: Left;  . Appendectomy  1962  . Abdominal hysterectomy  1988  . Partial colectomy and colostomy  sept 2013    mucous fistula done  . Partial colectomy  05/17/2012    Procedure: PARTIAL COLECTOMY;  Surgeon: Odis Hollingshead, MD;  Location: [facility];  Service: General;  Laterality: N/A;  . Colostomy closure  05/17/2012    Procedure: COLOSTOMY CLOSURE;  Surgeon: Odis Hollingshead, MD;  Location: [facility];  Service: General;  Laterality: N/A;  . Laparotomy  05/17/2012    Procedure: EXPLORATORY LAPAROTOMY;  Surgeon: Odis Hollingshead, MD;  Location: [facility];  Service: General;;  . Lysis of adhesion  05/17/2012    Procedure: LYSIS OF ADHESION;  Surgeon: Odis Hollingshead, MD;  Location: [facility];  Service: General;;  . Esophageal biopsy  05/17/2012    Procedure: BIOPSY;  Surgeon: Odis Hollingshead, MD;  Location: [facility];  Service: General;;  omental biopsy  . Laparotomy  05/22/2012    Procedure: EXPLORATORY LAPAROTOMY;  Surgeon: Odis Hollingshead, MD;  Location: [facility];  Service: General;  Laterality: N/A;  DRAINAGE  INTRA-ABDOMINAL ABSCESS/LYSIS OF ADHESIONSFOR SMALL BOWEL OBSTRUCTION/DIVERTING LOOP ILEOSTOMY  . Tee without cardioversion N/A 11/25/2013    Procedure: TRANSESOPHAGEAL ECHOCARDIOGRAM (TEE);  Surgeon: Dorothy Spark, MD;  Location: [place];  Service: Cardiovascular;  Laterality: N/A;       Prior to Admission medications   Medication Sig Start Date End Date Taking? Authorizing Provider  albuterol (PROVENTIL HFA;VENTOLIN HFA) 108 (90 BASE) MCG/ACT inhaler Inhale 1-2 puffs into the lungs every 6 (six) hours as needed for wheezing or shortness of breath.    Yes Historical Provider, MD  aspirin 325 MG EC tablet Take 325 mg by mouth every morning.    Yes Historical Provider, MD  buPROPion (WELLBUTRIN XL) 300 MG 24 hr tablet Take 300 mg by mouth daily before breakfast.    Yes Historical Provider, MD  calcium carbonate (TUMS E-X  750) 750 MG chewable tablet Chew 1 tablet (750 mg total) by mouth 3 (three) times daily. 10/18/13  Yes Nishant Dhungel, MD  cyclobenzaprine (FLEXERIL) 10 MG tablet Take 10 mg by mouth 3 (three) times daily as needed for muscle spasms.  07/12/13  Yes Historical Provider, MD  diltiazem (CARDIZEM CD) 120 MG 24 hr capsule Take 1 capsule (120 mg total) by mouth daily. 11/27/13  Yes Barton Dubois, MD  doxycycline (VIBRA-TABS) 100 MG tablet Take 1 tablet (100 mg total) by mouth 2 (two) times daily. 12/10/13 01/06/14 Yes Barton Dubois, MD  esomeprazole (NEXIUM) 40 MG capsule Take 40 mg by mouth 2 (two) times daily before a meal.    Yes Historical Provider, MD  estradiol (ESTRACE) 2 MG tablet Take 2 mg by  mouth daily before breakfast.    Yes Historical Provider, MD  HYDROmorphone (DILAUDID) 2 MG tablet Take 2-4 mg by mouth every 6 (six) hours as needed for severe pain.   Yes Historical Provider, MD  metoprolol (LOPRESSOR) 50 MG tablet Take 1 tablet (50 mg total) by mouth 2 (two) times daily. 11/27/13  Yes Barton Dubois, MD  ondansetron (ZOFRAN) 4 MG tablet Take 4 mg by mouth every 8 (eight) hours as needed for nausea or vomiting.   Yes Historical Provider, MD  promethazine (PHENERGAN) 25 MG tablet Take 25 mg by mouth every 6 (six) hours as needed for nausea or vomiting (nausea).   Yes Historical Provider, MD  saccharomyces boulardii (FLORASTOR) 250 MG capsule Take 1 capsule (250 mg total) by mouth 2 (two) times daily. 11/27/13  Yes Barton Dubois, MD  loratadine-pseudoephedrine (CLARITIN-D 12-HOUR) 5-120 MG per tablet Take 1 tablet by mouth daily as needed for allergies.     Historical Provider, MD      Allergies  Allergen Reactions  . Vancomycin Hives and Rash    wheezing  . Ativan [Lorazepam] Other (See Comments)    "makes me crazy"  . Codeine Nausea And Vomiting  . Tetanus Toxoids Other (See Comments)    Serum reaction  . Penicillins Hives and Rash  . Xarelto [Rivaroxaban] Hives and Rash    (May not be  allergic. Vancomycin was also being taken when reaction occurred.)     Social History:  reports that she quit smoking about 8 months ago. Her smoking use included Cigarettes. She has a 5 pack-year smoking history. She has never used smokeless tobacco. She reports that she does not drink alcohol or use illicit drugs.     Family History  Problem Relation Age of Onset  . Stroke Father   . Atrial fibrillation Father   . Hypertension Mother        Physical Exam:  GEN:  Pleasant Frail thin 63 y.o. Caucasian female  examined  and in no acute distress; cooperative with exam Filed Vitals:   12/14/13 0140 12/14/13 0142 12/14/13 0404  BP: 64/40 60/41   Pulse: 130 85   Temp:  97.9 F (36.6 C)   TempSrc:  Rectal   Resp:  21   Height:   5\' 5"  (1.651 m)  Weight:   65.8 kg (145 lb 1 oz)   Blood pressure 60/41, pulse 85, temperature 97.9 F (36.6 C), temperature source Rectal, resp. rate 21, height 5\' 5"  (1.651 m), weight 65.8 kg (145 lb 1 oz). PSYCH: SHe is alert and oriented x4; does not appear anxious does not appear depressed; affect is normal HEENT: Normocephalic and Atraumatic, Mucous membranes pink; PERRLA; EOM intact; Fundi:  Benign;  No scleral icterus, Nares: Patent, Oropharynx: Clear, Fair Dentition, Neck:  FROM, No Cervical Lymphadenopathy nor Thyromegaly or Carotid Bruit; No JVD; Breasts:: Not examined CHEST WALL: No tenderness CHEST: Normal respiration, clear to auscultation bilaterally HEART: Regular rate and rhythm; no murmurs rubs or gallops BACK: No kyphosis or scoliosis; No CVA tenderness ABDOMEN: Positive Bowel Sounds, + Ileostomy present, on RLQ, and + Open ABD Wound with Greenish Granulation tissue centrally, No Foul odor, Soft Non-Tender; No Masses, No Organomegaly, No Pannus; No Intertriginous candida. Rectal Exam: Not done EXTREMITIES: No Cyanosis, Clubbing, or Edema; No Ulcerations. Genitalia: not examined PULSES: 2+ and symmetric SKIN: Normal hydration no rash or  ulceration CNS: Alert and oriented x 4, No focal Deficits, Generalized Weakness   Vascular: pulses palpable throughout  Labs on Admission:  Basic Metabolic Panel:  Recent Labs Lab 12/14/13 0215  NA 130*  K 5.3  CL 101  CO2 13*  GLUCOSE 99  BUN 20  CREATININE 1.38*  CALCIUM 4.8*   Liver Function Tests:  Recent Labs Lab 12/14/13 0215  AST 17  ALT 10  ALKPHOS 306*  BILITOT 0.3  PROT 5.7*  ALBUMIN 2.0*   No results found for this basename: LIPASE, AMYLASE,  in the last 168 hours No results found for this basename: AMMONIA,  in the last 168 hours CBC:  Recent Labs Lab 12/14/13 0215  WBC 16.5*  NEUTROABS 10.4*  HGB 7.4*  HCT 22.3*  MCV 93.3  PLT 417*   Cardiac Enzymes: No results found for this basename: CKTOTAL, CKMB, CKMBINDEX, TROPONINI,  in the last 168 hours  BNP (last 3 results)  Recent Labs  03/24/13 2031 03/25/13 0334 03/26/13 0414  PROBNP 9254.0* 9403.0* 4535.0*   CBG: No results found for this basename: GLUCAP,  in the last 168 hours  Radiological Exams on Admission: Dg Chest Port 1 View  12/14/2013   CLINICAL DATA:  Chest pain  EXAM: PORTABLE CHEST - 1 VIEW  COMPARISON:  Prior radiograph from 11/20/2013  FINDINGS: Heart size is stable from prior exam, and remains within normal limits. Tortuosity of the intrathoracic aorta noted.  The lungs are normally inflated. Diffuse prominence of the interstitial markings is stable. No airspace consolidation, pleural effusion, or pulmonary edema is identified. There is no pneumothorax.  No acute osseous abnormality identified.  IMPRESSION: No acute cardiopulmonary abnormality.   Electronically Signed   By: Jeannine Boga M.D.   On: 12/14/2013 02:45   Dg Abd Portable 2v  12/14/2013   CLINICAL DATA:  Abdominal distention.  Dizziness.  Nausea.  EXAM: PORTABLE ABDOMEN - 2 VIEW  COMPARISON:  One-view abdomen 08/24/2013.  FINDINGS: The stent loops of large and small bowel are again seen. Fluid levels are  present on the decubitus image. There is no free air. Degenerative changes are present in the thoracolumbar spine with stable scoliosis.  IMPRESSION: 1. Ten loops of large and small bowel with fluid levels but no obstruction. The finding is most compatible with an adynamic ileus. 2. No free air. 3. Stable degenerative change in scoliosis in the lumbar spine.   Electronically Signed   By: Lawrence Santiago M.D.   On: 12/14/2013 02:39     EKG: Independently reviewed.    Assessment/Plan:   63 y.o. female with   Principal Problem:   1.    Hypovolemia dehydration/Shock/ Hypotension- due to High Output Ostomy   IVFs for Fluid Resuscitation   Monitor for Signs and Sxs of fluid Overload     PCCM consult in event needs pressors    2.    Atrial fibrillation with rapid ventricular response   Due to #1   IVFs to correct #1   IV Cardizem Drip       3.    Hypocalcemia- due to loss from High Output Ostomy   IV Calcium Gluconate for replacement   Monitor Calcium levels   Also continue PO TUMS TID     4.    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    CMML (chronic myelomonocytic leukemia)   stable     6.    Open abdominal wall wound   Wound care evaluation  7.    Metabolic acidosis- Due to High Output Ostomy, and Resolvign Infection ( on Doxycycline)   Change Doxycycline to IV due to high transit through GI     8.    MRSA carrier   Chronic   Hibiclens Washes BID x 1week   May need ID consult     9.    COPD (chronic obstructive pulmonary disease)   Monitor O2 Sats and Albuterol Nebs PRN    10.   Anemia of chronic disease -    Send Anemia panel      11.   Leukocytosis, unspecified-   Due to # 5, Monitor Trend   +/- Early Sepsis   12.   Nausea   IV Anti-Emetics PRN    13.   DVT prophylaxis   Lovenox.         Code Status:  FULL CODE   Family Communication:   Family at Bedside  Disposition Plan:        Inpatient to ICU   Time spent:  [facility] Pager [phone]   If [place] Please Contact the Day Rounding Team MD for [facility] 5, and  If 7PM-7AM, Please Contact night-coverage  [email or web address] Password [facility] 12/14/2013, 4:23 AM

## 2013-12-14 NOTE — Procedures (Signed)
[name] is a 63 y.o. female patient. 1. Shock   2. Atrial fibrillation with RVR   3. Anemia, unspecified anemia type   4. Hypocalcemia   5. Hypovolemia dehydration   6. Anemia of chronic disease   7. CMML (chronic myelomonocytic leukemia)   8. Centrilobular emphysema   9. Hyponatremia   10. Hypotension, unspecified hypotension type   11. Leukocytosis, unspecified   12. Metabolic acidosis   13. Open abdominal wall wound, initial encounter   14. Protein-calorie malnutrition, severe    Past Medical History  Diagnosis Date  . Hypertension     She has a past hx of essential  . 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 Inflammation of joint of knee     Since we last last saw her she developed problems with an acute which required surgical drainage by her orthopedist Dr. Durward Fortes.  Marland Kitchen MRSA (methicillin resistant Staphylococcus aureus)     Knee surgery drainage was positive for MRSA and she was treated with 3 weeks of doxycycline successfully.  . Diarrhea     Mild  . Exogenous obesity   . GERD (gastroesophageal reflux disease)     2 hosp.- ischemic colitis - residual of Norovirus, 05/2011- sm. bowel obstruction  . Headache(784.0)     migraine headache on occas, less now than when she was younger   . Arthritis     L hip, back, neck   . History of blood transfusion sept 2013    04/2011- /w ischemic colitis , trouble with matching blood  sept 2013  . Anemia     will see hematology consult prior to surgery, recommended by Dr. Mare Ferrari  . Anemia 12/15/2010  . Ischemic colitis 01/31/2012  . Atrial flutter     during hospitalization, 04/2011- related to anemia & illness/stress   . Pneumonia     04/2011- not hospitalized , pt. denies SOB, changes in chest, breathing  . 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 Dizziness     occasional  . B12 deficiency 02/27/2013  . 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 COPD  (chronic obstructive pulmonary disease) 09/26/2013   Blood pressure 57/36, pulse 117, temperature 99.1 F (37.3 C), temperature source Axillary, resp. rate 21, height 5\' 5"  (1.651 m), weight 145 lb 1 oz (65.8 kg), SpO2 94.00%.  Procedures: Central Venous Catheter Insertion Procedure Note Dalesha Stanback [id number] 1950/10/19  Procedure: Insertion of Central Venous Catheter Indications: Assessment of intravascular volume, Drug and/or fluid administration and Frequent blood sampling  Procedure Details Consent: Risks of procedure as well as the alternatives and risks of each were explained to the (patient/caregiver).  Consent for procedure obtained. Time Out: Verified patient identification, verified procedure, site/side was marked, verified correct patient position, special equipment/implants available, medications/allergies/relevent history reviewed, required imaging and test results available.  Performed  Maximum sterile technique was used including antiseptics, cap, gloves, gown, hand hygiene, mask and sheet. Skin prep: Chlorhexidine; local anesthetic administered A antimicrobial bonded/coated triple lumen catheter was placed in the left internal jugular vein using the Seldinger technique.  Evaluation Blood flow good Complications: No apparent complications Patient did not tolerate procedure well due to claustrophobia / anxiety. Chest X-ray ordered to verify placement.  CXR: pending.  Timo Hartwig 12/14/2013, 7:26 AM    Kahlen Morais 12/14/2013

## 2013-12-14 NOTE — ED Notes (Signed)
Calcium 4.8, Dr. Sabra Heck and [place], RN notified

## 2013-12-14 NOTE — Progress Notes (Signed)
PULMONARY  / CRITICAL CARE MEDICINE CONSULTATION   Name: [name] MRN: [id number] DOB: 12-18-1950    ADMISSION DATE:  12/14/2013 CONSULTATION DATE: 12/14/2013  REQUESTING CLINICIAN: Dr. 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  SIGNIFICANT EVENTS / STUDIES:   LINES / TUBES: L IJ CVL 8/29 >>>  CULTURES: Urine 8/4 >>> MRSA Blood 8/29 >>>  Abdominal wound 8/29 >>>  ANTIBIOTICS: Doxycycline 8/29 >>> Aztreonam 8/29 >>>  INTERVAL HISTORY:  BP improved after NS 1000 x 5, last CVP 8, complaints of some pain, RN reports drainage form abdominal would  VITAL SIGNS: Temp:  [97.9 F (36.6 C)-99.1 F (37.3 C)] 98.6 F (37 C) (08/29 0830) Pulse Rate:  [61-130] 117 (08/29 0600) Resp:  [18-22] 21 (08/29 0600) BP: (57-114)/(36-98) 96/58 mmHg (08/29 0859) SpO2:  [54 %-98 %] 94 % (08/29 0600) Weight:  [65.8 kg (145 lb 1 oz)] 65.8 kg (145 lb 1 oz) (08/29 0404)  HEMODYNAMICS:   VENTILATOR SETTINGS:   INTAKE / OUTPUT: Intake/Output     08/28 0701 - 08/29 0700 08/29 0701 - 08/30 0700   I.V. (mL/kg) 130.9 (2)    IV Piggyback 8.3    Total Intake(mL/kg) 139.2 (2.1)    Net +139.2           PHYSICAL EXAMINATION: General:  No distress Neuro:  Awake, alert, cooperative, appears to be in pain HEENT: Dry membranes Neck: No JVD Cardiovascular: Irregular, somewhat tachycardic Abdomen:  Viable looking ileostomy, healing abdominal wound with some drainage Musculoskeletal:  No edema Skin:  No rash  LABS:  CBC  Recent Labs Lab 12/14/13 0215  WBC 16.5*  HGB 7.4*  HCT 22.3*  PLT 417*   Coag's  Recent Labs Lab 12/14/13 0215  APTT 34  INR 1.32   BMET  Recent Labs Lab 12/14/13 0215  NA 130*  K 5.3  CL 101  CO2 13*  BUN 20  CREATININE 1.38*  GLUCOSE 99   Electrolytes  Recent Labs Lab  12/14/13 0215  CALCIUM 4.8*   Sepsis Markers  Recent Labs Lab 12/14/13 0215  LATICACIDVEN 1.8   ABG No results found for this basename: PHART, PCO2ART, PO2ART,  in the last 168 hours Liver Enzymes  Recent Labs Lab 12/14/13 0215  AST 17  ALT 10  ALKPHOS 306*  BILITOT 0.3  ALBUMIN 2.0*   Cardiac Enzymes No results found for this basename: TROPONINI, PROBNP,  in the last 168 hours  Glucose No results found for this basename: GLUCAP,  in the last 168 hours  IMAGING:   Dg Chest Port 1 View  12/14/2013   CLINICAL DATA:  Status post central line placed  EXAM: PORTABLE CHEST - 1 VIEW  COMPARISON:  12/14/2013 217 hr  FINDINGS: The left jugular central line is been placed with the catheter tip at the cavoatrial junction. No pneumothorax is noted. Cardiac shadow is stable. The lungs are well aerated. Very minimal right basilar atelectasis is noted. No bony abnormality is seen.  IMPRESSION: No pneumothorax following central line placement.  Minimal right basilar atelectasis.   Electronically Signed   By: Inez Catalina M.D.   On: 12/14/2013 07:42   Dg Chest Port 1 View  12/14/2013   CLINICAL DATA:  Chest pain  EXAM: PORTABLE CHEST - 1 VIEW  COMPARISON:  Prior radiograph from 11/20/2013  FINDINGS: Heart  size is stable from prior exam, and remains within normal limits. Tortuosity of the intrathoracic aorta noted.  The lungs are normally inflated. Diffuse prominence of the interstitial markings is stable. No airspace consolidation, pleural effusion, or pulmonary edema is identified. There is no pneumothorax.  No acute osseous abnormality identified.  IMPRESSION: No acute cardiopulmonary abnormality.   Electronically Signed   By: Jeannine Boga M.D.   On: 12/14/2013 02:45   Dg Abd Portable 2v  12/14/2013   CLINICAL DATA:  Abdominal distention.  Dizziness.  Nausea.  EXAM: PORTABLE ABDOMEN - 2 VIEW  COMPARISON:  One-view abdomen 08/24/2013.  FINDINGS: The stent loops of large and small bowel  are again seen. Fluid levels are present on the decubitus image. There is no free air. Degenerative changes are present in the thoracolumbar spine with stable scoliosis.  IMPRESSION: 1. Ten loops of large and small bowel with fluid levels but no obstruction. The finding is most compatible with an adynamic ileus. 2. No free air. 3. Stable degenerative change in scoliosis in the lumbar spine.   Electronically Signed   By: Lawrence Santiago M.D.   On: 12/14/2013 02:39   ASSESSMENT / PLAN:  PULMONARY A: No active issues P:   Goal SpO2>92 Supplemental oxygen PRN  CARDIOVASCULAR A: Hypotension secondary to hypovolemia Possible SIRS / sepsis Lactate is  reassuring AF with RVR P:   Goal MAP > 65, HR<120 Neo-Synephrine PRN Digoxin load ( one more dose pending ) D/c Cardizem D/c Metoprlol Metoprolol PRN  RENAL A: AKI Hypovolemia Hypocalcemia, suspect partly erroneous secondary to malnutrition / hypovolemia Metabolic acidosis secondary to bicarbonate loss P:   Goal CVP 8-10 D/c NS gtt D/c Ca gtt Increase bicarbonate gtt to 100 mL/h Check ionized Ca NS 500 x 1 and PRN for goal CVP  GASTROINTESTINAL A: S/p ileostomy with high autput  Ileus GERD P:   Liquid diet Protonix Florastor Sucralfate  HEMATOLOGIC A: Anemia CML VTE Px P:   Goal Hb>=7 Lovenox  INFECTIOUS A: Recent MRSA UTI Draining abdominal wound with h/o Pseudomonas infection PCN allergy P:   Doxycycline 8/29 Add Aztreonam 8/29 Add wound cx / urine cx Follow blood cx   ENDOCRINE A: Unknown adrenal function P:   Cortisol level  NEUROLOGIC A: Pain Depression P: Oxycodone PRN Add Fentanyl PRN Preadmission Wellbutrin  I have personally obtained history, examined patient, evaluated and interpreted laboratory and imaging results, reviewed medical records, formulated assessment / plan and placed orders.  CRITICAL CARE:  The patient is critically ill with multiple organ systems failure and  requires high complexity decision making for assessment and support, frequent evaluation and titration of therapies, application of advanced monitoring technologies and extensive interpretation of multiple databases. Additional Critical Care Time devoted to patient care services described in this note is 35 minutes.   Doree Fudge, MD Pulmonary and [place] Pager: [phone]  12/14/2013, 10:18 AM

## 2013-12-15 DIAGNOSIS — R911 Solitary pulmonary nodule: Secondary | ICD-10-CM

## 2013-12-15 DIAGNOSIS — K560 Paralytic ileus: Secondary | ICD-10-CM

## 2013-12-15 DIAGNOSIS — R579 Shock, unspecified: Secondary | ICD-10-CM

## 2013-12-15 DIAGNOSIS — A419 Sepsis, unspecified organism: Secondary | ICD-10-CM

## 2013-12-15 LAB — TYPE AND SCREEN
ABO/RH(D): A POS
Antibody Screen: NEGATIVE
Donor AG Type: NEGATIVE
Unit division: 0

## 2013-12-15 LAB — CBC
HEMATOCRIT: 19.7 % — AB (ref 36.0–46.0)
HEMOGLOBIN: 7 g/dL — AB (ref 12.0–15.0)
MCH: 32.1 pg (ref 26.0–34.0)
MCHC: 35.5 g/dL (ref 30.0–36.0)
MCV: 90.4 fL (ref 78.0–100.0)
Platelets: 230 10*3/uL (ref 150–400)
RBC: 2.18 MIL/uL — ABNORMAL LOW (ref 3.87–5.11)
RDW: 18.5 % — ABNORMAL HIGH (ref 11.5–15.5)
WBC: 25.5 10*3/uL — AB (ref 4.0–10.5)

## 2013-12-15 LAB — BASIC METABOLIC PANEL
ANION GAP: 12 (ref 5–15)
BUN: 20 mg/dL (ref 6–23)
CHLORIDE: 99 meq/L (ref 96–112)
CO2: 18 meq/L — AB (ref 19–32)
Calcium: 4.6 mg/dL — CL (ref 8.4–10.5)
Creatinine, Ser: 1.06 mg/dL (ref 0.50–1.10)
GFR calc Af Amer: 63 mL/min — ABNORMAL LOW (ref >90)
GFR calc non Af Amer: 54 mL/min — ABNORMAL LOW (ref >90)
GLUCOSE: 124 mg/dL — AB (ref 70–99)
Potassium: 4.1 mEq/L (ref 3.7–5.3)
SODIUM: 129 meq/L — AB (ref 137–147)

## 2013-12-15 LAB — CALCIUM, IONIZED: CALCIUM ION: 0.65 mmol/L — AB (ref 1.13–1.30)

## 2013-12-15 LAB — CLOSTRIDIUM DIFFICILE BY PCR: Toxigenic C. Difficile by PCR: POSITIVE — AB

## 2013-12-15 MED ORDER — MAGNESIUM SULFATE 40 MG/ML IJ SOLN
2.0000 g | Freq: Once | INTRAMUSCULAR | Status: AC
  Administered 2013-12-15: 2 g via INTRAVENOUS
  Filled 2013-12-15: qty 50

## 2013-12-15 MED ORDER — MAGNESIUM OXIDE 400 (241.3 MG) MG PO TABS
400.0000 mg | ORAL_TABLET | Freq: Two times a day (BID) | ORAL | Status: DC
  Administered 2013-12-16 – 2013-12-22 (×12): 400 mg via ORAL
  Filled 2013-12-15 (×15): qty 1

## 2013-12-15 MED ORDER — SODIUM CHLORIDE 0.9 % IV SOLN
1.0000 g | Freq: Once | INTRAVENOUS | Status: AC
  Administered 2013-12-15: 1 g via INTRAVENOUS
  Filled 2013-12-15: qty 10

## 2013-12-15 MED ORDER — METRONIDAZOLE IN NACL 5-0.79 MG/ML-% IV SOLN
500.0000 mg | Freq: Three times a day (TID) | INTRAVENOUS | Status: DC
  Administered 2013-12-15 – 2013-12-20 (×14): 500 mg via INTRAVENOUS
  Filled 2013-12-15 (×15): qty 100

## 2013-12-15 MED ORDER — FAMOTIDINE 40 MG PO TABS
40.0000 mg | ORAL_TABLET | Freq: Every day | ORAL | Status: DC
  Administered 2013-12-16 – 2013-12-22 (×7): 40 mg via ORAL
  Filled 2013-12-15: qty 2
  Filled 2013-12-15 (×6): qty 1
  Filled 2013-12-15: qty 2

## 2013-12-15 MED ORDER — SODIUM CHLORIDE 0.9 % IV SOLN: Freq: Once | INTRAVENOUS | Status: DC

## 2013-12-15 MED ORDER — SODIUM CHLORIDE 0.9 % IV BOLUS (SEPSIS)
500.0000 mL | Freq: Once | INTRAVENOUS | Status: AC
  Administered 2013-12-15: 500 mL via INTRAVENOUS

## 2013-12-15 MED ORDER — FIDAXOMICIN 200 MG PO TABS
200.0000 mg | ORAL_TABLET | Freq: Two times a day (BID) | ORAL | Status: DC
  Administered 2013-12-15 – 2013-12-22 (×15): 200 mg via ORAL
  Filled 2013-12-15 (×17): qty 1

## 2013-12-15 MED ORDER — LINEZOLID 2 MG/ML IV SOLN
600.0000 mg | Freq: Two times a day (BID) | INTRAVENOUS | Status: DC
  Administered 2013-12-15 – 2013-12-16 (×3): 600 mg via INTRAVENOUS
  Filled 2013-12-15 (×4): qty 300

## 2013-12-15 NOTE — Progress Notes (Signed)
CRITICAL VALUE ALERT  Critical value received: C diff positive  Date of notification:  12/15/2013  Time of notification: 9702  Critical value read back:yes  Nurse who received alert:  Lacinda Axon  RN  MD notified (1st page):  Dr. Dyann Kief  Time of first page: 1345  MD notified (2nd page):n/a  Time of second page:n/a  Responding MD:  Dr. Dyann Kief  Time MD responded:  1400

## 2013-12-15 NOTE — Progress Notes (Signed)
eLink Physician-Brief Progress Note Patient Name: [name] DOB: [birth year] MRN: [id number]   Date of Service  12/15/2013  HPI/Events of Note  Hypocalcemia  eICU Interventions  Calcium replaced     Intervention Category Intermediate Interventions: Electrolyte abnormality - evaluation and management  [name] 12/15/2013, 6:12 AM

## 2013-12-15 NOTE — Consult Note (Addendum)
[facility]    Date of Admission:  12/14/2013  Date of Consult:  12/15/2013  Reason for Consult: Clostridium difficile enteritis, with ILEUS recent MRSA in urine with pulmonary nodules  Referring Physician: Dr. Dyann Kief  HPI: [name] is an 63 y.o. female. 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 During that admission she had SIRS, septic physiology and was found to have MRSA in urine but not on admission blood cultures. CT chest had shown multiple nodules in the lungs which made me concerned the patient could have had occult MRSA bacteremia, endocarditis with septic emboli  She underwent TEE which actually did not show vegeations.  We opted to treat her for 2 weeks with zyvox and the oral doxycyline. She was still taking zyvox (actually beyond the 2 weeks I had recommended it appears) when she developed fevers, some abdominal discomfort and nausea. She had BP in the 60s. She came to ED with shock picture likely multifactorial with sepsis + AF RVR being main suspects and was admitted to [facility] with consultation from CCM. She has been found to have adynamic ileus.She had central line placed and has been volume rescuscitated. She has been treated with zyvox and aztreonam again (patient is apparently allergic to pcn and vancomycin and hence she did not get the typical vanco/zosyn sepsis abx) she is improved today. She reportedly has had increased output from her ostomy bag but today she tells me that she has been having this higher output for months and past few days are NOT more than usual. She had C difficile PCR positive stool and started on DIFICID.    Past Medical History  Diagnosis Date  . Hypertension     She has a past hx of essential  . Elevated liver function tests     She  also has a past hx of chronically studies felt to be secondary to Celebrex  . Inflammation of joint of knee     Since we last last saw her she developed problems with an acute which required surgical drainage by her orthopedist Dr. Durward Fortes.  Marland Kitchen MRSA (methicillin resistant Staphylococcus aureus)     Knee surgery drainage was positive for MRSA and she was treated with 3 weeks of doxycycline successfully.  . Diarrhea     Mild  . Exogenous obesity   . GERD (gastroesophageal reflux disease)     2 hosp.- ischemic colitis - residual of Norovirus, 05/2011- sm. bowel obstruction  . Headache(784.0)     migraine headache on occas, less now than when she was younger   . Arthritis     L hip, back, neck   . History of blood transfusion sept 2013    04/2011- /w ischemic colitis , trouble with matching blood  sept 2013  . Anemia     will see hematology consult prior to surgery, recommended by Dr. Mare Ferrari  . Anemia 12/15/2010  . Ischemic colitis 01/31/2012  . Atrial flutter     during hospitalization, 04/2011- related to anemia & illness/stress   . Pneumonia     04/2011- not hospitalized , pt. denies SOB, changes in chest, breathing  . 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 Dizziness     occasional  . B12 deficiency 02/27/2013  . 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  COPD (chronic obstructive pulmonary disease) 09/26/2013    Past Surgical History  Procedure Laterality Date  . Knee surgery      I&D- 2008, post laceration   . Colonoscopy  05/16/2011    Procedure: COLONOSCOPY;  Surgeon: Winfield Cunas., MD;  Location: [facility];  Service: Endoscopy;  Laterality: N/A;  . Small intestine surgery  1992, 1999  . Laparotomy and lysis of adhesions    . Total hip arthroplasty  10/25/2011    Procedure: TOTAL HIP ARTHROPLASTY;  Surgeon: Garald Balding, MD;  Location: [facility];  Service: Orthopedics;  Laterality: Left;  . Appendectomy  1962  . Abdominal hysterectomy  1988  . Partial colectomy and  colostomy  sept 2013    mucous fistula done  . Partial colectomy  05/17/2012    Procedure: PARTIAL COLECTOMY;  Surgeon: Odis Hollingshead, MD;  Location: [facility];  Service: General;  Laterality: N/A;  . Colostomy closure  05/17/2012    Procedure: COLOSTOMY CLOSURE;  Surgeon: Odis Hollingshead, MD;  Location: [facility];  Service: General;  Laterality: N/A;  . Laparotomy  05/17/2012    Procedure: EXPLORATORY LAPAROTOMY;  Surgeon: Odis Hollingshead, MD;  Location: [facility];  Service: General;;  . Lysis of adhesion  05/17/2012    Procedure: LYSIS OF ADHESION;  Surgeon: Odis Hollingshead, MD;  Location: [facility];  Service: General;;  . Esophageal biopsy  05/17/2012    Procedure: BIOPSY;  Surgeon: Odis Hollingshead, MD;  Location: [facility];  Service: General;;  omental biopsy  . Laparotomy  05/22/2012    Procedure: EXPLORATORY LAPAROTOMY;  Surgeon: Odis Hollingshead, MD;  Location: [facility];  Service: General;  Laterality: N/A;  DRAINAGE  INTRA-ABDOMINAL ABSCESS/LYSIS OF ADHESIONSFOR SMALL BOWEL OBSTRUCTION/DIVERTING LOOP ILEOSTOMY  . Tee without cardioversion N/A 11/25/2013    Procedure: TRANSESOPHAGEAL ECHOCARDIOGRAM (TEE);  Surgeon: Dorothy Spark, MD;  Location: [facility];  Service: Cardiovascular;  Laterality: N/A;  ergies:   Allergies  Allergen Reactions  . Vancomycin Hives and Rash    wheezing  . Ativan [Lorazepam] Other (See Comments)    "makes me crazy"  . Codeine Nausea And Vomiting  . Tetanus Toxoids Other (See Comments)    Serum reaction  . Penicillins Hives and Rash  . Xarelto [Rivaroxaban] Hives and Rash    (May not be allergic. Vancomycin was also being taken when reaction occurred.)     Medications: I have reviewed patients current medications as documented in Epic Anti-infectives   Start     Dose/Rate Route Frequency Ordered Stop   12/15/13 1600  metroNIDAZOLE (FLAGYL) IVPB 500 mg     500 mg 100 mL/hr over 60 Minutes Intravenous Every 8 hours 12/15/13 1504     12/15/13 1415   fidaxomicin (DIFICID) tablet 200 mg     200 mg Oral 2 times daily 12/15/13 1355 12/25/13 0959   12/15/13 1000  linezolid (ZYVOX) IVPB 600 mg     600 mg 300 mL/hr over 60 Minutes Intravenous Every 12 hours 12/15/13 0843     12/14/13 1100  aztreonam (AZACTAM) 2 g in dextrose 5 % 50 mL IVPB  Status:  Discontinued     2 g 100 mL/hr over 30 Minutes Intravenous 3 times per day 12/14/13 1045 12/15/13 1502   12/14/13 1000  doxycycline (VIBRA-TABS) tablet 100 mg  Status:  Discontinued     100 mg Oral 2 times daily 12/14/13 0410 12/14/13 0520   12/14/13 0600  doxycycline (VIBRAMYCIN) 100 mg in  dextrose 5 % 250 mL IVPB  Status:  Discontinued     100 mg 125 mL/hr over 120 Minutes Intravenous Every 12 hours 12/14/13 0520 12/15/13 0843      Social History:  reports that she quit smoking about 8 months ago. Her smoking use included Cigarettes. She has a 5 pack-year smoking history. She has never used smokeless tobacco. She reports that she does not drink alcohol or use illicit drugs.  Family History  Problem Relation Age of Onset  . Stroke Father   . Atrial fibrillation Father   . Hypertension Mother     As in HPI and primary teams notes otherwise 12 point review of systems is negative  Blood pressure 103/62, pulse 96, temperature 98 F (36.7 C), temperature source Oral, resp. rate 17, height 5' 5"  (1.651 m), weight 141 lb 8.6 oz (64.2 kg), SpO2 100.00%. General: Alert and awake, oriented x3, not in any acute distress. HEENT:  EOMI, oropharynx clear and without exudate CVS regular rate, normal r,  no murmur rubs or gallops Chest: clear to auscultation bilaterally, no wheezing, rales or rhonchi Abdomen: soft ostomy bag being emptied with liquid brown stool Neuro: nonfocal, strength and sensation intact   Results for orders placed during the hospital encounter of 12/14/13 (from the past 48 hour(s))  MAGNESIUM     Status: Abnormal   Collection Time    12/14/13  2:08 AM      Result Value Ref  Range   Magnesium 0.5 (*) 1.5 - 2.5 mg/dL   Comment: CRITICAL RESULT CALLED TO, READ BACK BY AND VERIFIED WITH:     MAIN,S AT [place] ON [id number] BY HOOKER,B  PHOSPHORUS     Status: None   Collection Time    12/14/13  2:08 AM      Result Value Ref Range   Phosphorus 3.7  2.3 - 4.6 mg/dL  CBC WITH DIFFERENTIAL     Status: Abnormal   Collection Time    12/14/13  2:15 AM      Result Value Ref Range   WBC 16.5 (*) 4.0 - 10.5 K/uL   RBC 2.39 (*) 3.87 - 5.11 MIL/uL   Hemoglobin 7.4 (*) 12.0 - 15.0 g/dL   HCT 22.3 (*) 36.0 - 46.0 %   MCV 93.3  78.0 - 100.0 fL   MCH 31.0  26.0 - 34.0 pg   MCHC 33.2  30.0 - 36.0 g/dL   RDW 20.2 (*) 11.5 - 15.5 %   Platelets 417 (*) 150 - 400 K/uL   Neutrophils Relative % 63  43 - 77 %   Lymphocytes Relative 16  12 - 46 %   Monocytes Relative 21 (*) 3 - 12 %   Eosinophils Relative 0  0 - 5 %   Basophils Relative 0  0 - 1 %   Neutro Abs 10.4 (*) 1.7 - 7.7 K/uL   Lymphs Abs 2.6  0.7 - 4.0 K/uL   Monocytes Absolute 3.5 (*) 0.1 - 1.0 K/uL   Eosinophils Absolute 0.0  0.0 - 0.7 K/uL   Basophils Absolute 0.0  0.0 - 0.1 K/uL   RBC Morphology POLYCHROMASIA PRESENT     Comment: RARE NRBCs   WBC Morphology MILD LEFT SHIFT (1-5% METAS, OCC MYELO, OCC BANDS)     Smear Review LARGE PLATELETS PRESENT    COMPREHENSIVE METABOLIC PANEL     Status: Abnormal   Collection Time    12/14/13  2:15 AM      Result Value Ref  Range   Sodium 130 (*) 137 - 147 mEq/L   Potassium 5.3  3.7 - 5.3 mEq/L   Chloride 101  96 - 112 mEq/L   CO2 13 (*) 19 - 32 mEq/L   Glucose, Bld 99  70 - 99 mg/dL   BUN 20  6 - 23 mg/dL   Creatinine, Ser 1.38 (*) 0.50 - 1.10 mg/dL   Calcium 4.8 (*) 8.4 - 10.5 mg/dL   Comment: REPEATED TO VERIFY     CRITICAL RESULT CALLED TO, READ BACK BY AND VERIFIED WITH:     A.DENNIS,RN AT 0304 ON 12/14/13 BY [facility]   Total Protein 5.7 (*) 6.0 - 8.3 g/dL   Albumin 2.0 (*) 3.5 - 5.2 g/dL   AST 17  0 - 37 U/L   ALT 10  0 - 35 U/L   Alkaline Phosphatase 306 (*) 39 -  117 U/L   Total Bilirubin 0.3  0.3 - 1.2 mg/dL   GFR calc non Af Amer 39 (*) >90 mL/min   GFR calc Af Amer 46 (*) >90 mL/min   Comment: (NOTE)     The eGFR has been calculated using the CKD EPI equation.     This calculation has not been validated in all clinical situations.     eGFR's persistently <90 mL/min signify possible Chronic Kidney     Disease.   Anion gap 16 (*) 5 - 15  APTT     Status: None   Collection Time    12/14/13  2:15 AM      Result Value Ref Range   aPTT 34  24 - 37 seconds  PROTIME-INR     Status: Abnormal   Collection Time    12/14/13  2:15 AM      Result Value Ref Range   Prothrombin Time 16.4 (*) 11.6 - 15.2 seconds   INR 1.32  0.00 - 1.49  LACTIC ACID, PLASMA     Status: None   Collection Time    12/14/13  2:15 AM      Result Value Ref Range   Lactic Acid, Venous 1.8  0.5 - 2.2 mmol/L  TYPE AND SCREEN     Status: None   Collection Time    12/14/13  3:32 AM      Result Value Ref Range   ABO/RH(D) A POS     Antibody Screen NEG     Sample Expiration 12/17/2013     Unit Number [id number]     Blood Component Type RBC, LR IRR     Unit division 00     Status of Unit ISSUED,FINAL     Donor AG Type       Value: NEGATIVE FOR E ANTIGEN NEGATIVE FOR DUFFY A ANTIGEN   Transfusion Status OK TO TRANSFUSE     Crossmatch Result COMPATIBLE    MRSA PCR SCREENING     Status: Abnormal   Collection Time    12/14/13  5:30 AM      Result Value Ref Range   MRSA by PCR POSITIVE (*) NEGATIVE   Comment:            The GeneXpert MRSA Assay (FDA     approved for NASAL specimens     only), is one component of a     comprehensive MRSA colonization     surveillance program. It is not     intended to diagnose MRSA     infection nor to guide or     monitor  treatment for     MRSA infections.     RESULT CALLED TO, READ BACK BY AND VERIFIED WITH:     Wallene Huh [id number] @ [phone] BY J SCOTTON  CULTURE, BLOOD (ROUTINE X 2)     Status: None   Collection Time    12/14/13   7:55 AM      Result Value Ref Range   Specimen Description BLOOD RIGHT HAND     Special Requests BOTTLES DRAWN AEROBIC ONLY 5 CC     Culture  Setup Time       Value: 12/14/2013 15:22     Performed at [facility]   Culture       Value:        BLOOD CULTURE RECEIVED NO GROWTH TO DATE CULTURE WILL BE HELD FOR 5 DAYS BEFORE ISSUING A FINAL NEGATIVE REPORT     Performed at [facility]   Report Status PENDING    PREPARE RBC (CROSSMATCH)     Status: None   Collection Time    12/14/13  9:00 AM      Result Value Ref Range   Order Confirmation ORDER PROCESSED BY BLOOD BANK    CULTURE, BLOOD (ROUTINE X 2)     Status: None   Collection Time    12/14/13 10:55 AM      Result Value Ref Range   Specimen Description BLOOD LEFT PORTA CATH     Special Requests       Value: BOTTLES DRAWN AEROBIC AND ANAEROBIC 10CC IMMUNOCOMPROMISED   Culture  Setup Time       Value: 12/14/2013 18:15     Performed at [facility]   Culture       Value:        BLOOD CULTURE RECEIVED NO GROWTH TO DATE CULTURE WILL BE HELD FOR 5 DAYS BEFORE ISSUING A FINAL NEGATIVE REPORT     Performed at [facility]   Report Status PENDING    VITAMIN B12     Status: None   Collection Time    12/14/13 10:55 AM      Result Value Ref Range   Vitamin B-12 545  211 - 911 pg/mL   Comment: Performed at [facility]     Status: None   Collection Time    12/14/13 10:55 AM      Result Value Ref Range   Folate 8.7     Comment: (NOTE)     Reference Ranges            Deficient:       0.4 - 3.3 ng/mL            Indeterminate:   3.4 - 5.4 ng/mL            Normal:              > 5.4 ng/mL     Performed at [facility]     Status: Abnormal   Collection Time    12/14/13 10:55 AM      Result Value Ref Range   Iron 11 (*) 42 - 135 ug/dL   TIBC 131 (*) 250 - 470 ug/dL   Saturation Ratios 8 (*) 20 - 55 %   UIBC 120 (*) 125 - 400 ug/dL   Comment: Performed at [facility]     Status: Abnormal   Collection Time    12/14/13 10:55 AM  Result Value Ref Range   Ferritin 832 (*) 10 - 291 ng/mL   Comment: Performed at [facility]     Status: Abnormal   Collection Time    12/14/13 10:55 AM      Result Value Ref Range   Retic Ct Pct 2.1  0.4 - 3.1 %   RBC. 1.81 (*) 3.87 - 5.11 MIL/uL   Retic Count, Manual 38.0  19.0 - 186.0 K/uL  CORTISOL     Status: None   Collection Time    12/14/13 10:55 AM      Result Value Ref Range   Cortisol, Plasma 71.1     Comment: (NOTE)     AM:  4.3 - 22.4 ug/dL     PM:  3.1 - 16.7 ug/dL     Performed at [facility], IONIZED     Status: Abnormal   Collection Time    12/14/13 10:55 AM      Result Value Ref Range   Calcium, Ion 0.65 (*) 1.13 - 1.30 mmol/L   Comment: Performed at [facility]     Status: None   Collection Time    12/14/13 11:15 AM      Result Value Ref Range   Specimen Description WOUND ABDOMINAL WOUND     Special Requests NONE     Gram Stain PENDING     Culture       Value: Culture reincubated for better growth     Performed at [facility]   Report Status PENDING    BASIC METABOLIC PANEL     Status: Abnormal   Collection Time    12/15/13  5:05 AM      Result Value Ref Range   Sodium 129 (*) 137 - 147 mEq/L   Potassium 4.1  3.7 - 5.3 mEq/L   Comment: RESULT REPEATED AND VERIFIED     DELTA CHECK NOTED   Chloride 99  96 - 112 mEq/L   CO2 18 (*) 19 - 32 mEq/L   Glucose, Bld 124 (*) 70 - 99 mg/dL   BUN 20  6 - 23 mg/dL   Creatinine, Ser 1.06  0.50 - 1.10 mg/dL   Calcium 4.6 (*) 8.4 - 10.5 mg/dL   Comment: REPEATED TO VERIFY     CRITICAL RESULT CALLED TO, READ BACK BY AND VERIFIED WITH:     Q MBEMENA AT 0605 ON 08.30.2015 BY NBROOKS   GFR calc non Af Amer 54 (*) >90 mL/min   GFR calc Af Amer 63 (*) >90 mL/min   Comment: (NOTE)     The eGFR has been calculated using the CKD EPI equation.     This  calculation has not been validated in all clinical situations.     eGFR's persistently <90 mL/min signify possible Chronic Kidney     Disease.   Anion gap 12  5 - 15  CBC     Status: Abnormal   Collection Time    12/15/13  5:05 AM      Result Value Ref Range   WBC 25.5 (*) 4.0 - 10.5 K/uL   RBC 2.18 (*) 3.87 - 5.11 MIL/uL   Hemoglobin 7.0 (*) 12.0 - 15.0 g/dL   HCT 19.7 (*) 36.0 - 46.0 %   MCV 90.4  78.0 - 100.0 fL   MCH 32.1  26.0 - 34.0 pg   MCHC 35.5  30.0 - 36.0 g/dL   RDW 18.5 (*)  11.5 - 15.5 %   Platelets 230  150 - 400 K/uL   Comment: DELTA CHECK NOTED     REPEATED TO VERIFY     SPECIMEN CHECKED FOR CLOTS  CLOSTRIDIUM DIFFICILE BY PCR     Status: Abnormal   Collection Time    12/15/13  9:24 AM      Result Value Ref Range   C difficile by pcr POSITIVE (*) NEGATIVE   Comment: CRITICAL RESULT CALLED TO, READ BACK BY AND VERIFIED WITH:     A.ARNOLD,RN 6720 12/15/13 M.CAMPBELL     Performed at [facility]   @BRIEFLABTABLE (sdes,specrequest,cult,reptstatus)   ) Recent Results (from the past 720 hour(s))  CULTURE, BLOOD (ROUTINE X 2)     Status: None   Collection Time    11/18/13 11:25 PM      Result Value Ref Range Status   Specimen Description BLOOD L ARM   Final   Special Requests BOTTLES DRAWN AEROBIC AND ANAEROBIC 5CC   Final   Culture  Setup Time     Final   Value: 11/19/2013 03:53     Performed at [facility]   Culture     Final   Value: NO GROWTH 5 DAYS     Performed at [facility]   Report Status 11/25/2013 FINAL   Final  CULTURE, BLOOD (ROUTINE X 2)     Status: None   Collection Time    11/18/13 11:30 PM      Result Value Ref Range Status   Specimen Description BLOOD LEFT ANTECUBITAL   Final   Special Requests BOTTLES DRAWN AEROBIC AND ANAEROBIC 5CC   Final   Culture  Setup Time     Final   Value: 11/19/2013 03:54     Performed at [facility]   Culture     Final   Value: NO GROWTH 5 DAYS     Performed at [facility]   Report Status 11/25/2013 FINAL   Final  MRSA PCR SCREENING     Status: Abnormal   Collection Time    11/19/13  2:45 AM      Result Value Ref Range Status   MRSA by PCR POSITIVE (*) NEGATIVE Final   Comment:            The GeneXpert MRSA Assay (FDA     approved for NASAL specimens     only), is one component of a     comprehensive MRSA colonization     surveillance program. It is not     intended to diagnose MRSA     infection nor to guide or     monitor treatment for     MRSA infections.     RESULT CALLED TO, READ BACK BY AND VERIFIED WITH:     JOHNSON RN AT 0510 ON 08.04.15 BY [facility]  URINE CULTURE     Status: None   Collection Time    11/19/13  8:49 AM      Result Value Ref Range Status   Specimen Description URINE, CATHETERIZED   Final   Special Requests NONE   Final   Culture  Setup Time     Final   Value: 11/19/2013 11:10     Performed at [facility]     Final   Value: >=100,000 COLONIES/ML     Performed at [facility]   Culture     Final   Value: METHICILLIN RESISTANT  STAPHYLOCOCCUS AUREUS     Note: RIFAMPIN AND GENTAMICIN SHOULD NOT BE USED AS SINGLE DRUGS FOR TREATMENT OF STAPH INFECTIONS. CRITICAL RESULT CALLED TO, READ BACK BY AND VERIFIED WITH: STEPHANIE DILLON @ 8:31AM 11/21/13 BY DWEEKS     Performed at [facility]   Report Status 11/21/2013 FINAL   Final   Organism ID, Bacteria METHICILLIN RESISTANT STAPHYLOCOCCUS AUREUS   Final  CULTURE, BLOOD (ROUTINE X 2)     Status: None   Collection Time    11/21/13  9:50 AM      Result Value Ref Range Status   Specimen Description BLOOD LEFT HAND   Final   Special Requests BOTTLES DRAWN AEROBIC AND ANAEROBIC [facility] EACH   Final   Culture  Setup Time     Final   Value: 11/21/2013 13:23     Performed at [facility]   Culture     Final   Value: NO GROWTH 5 DAYS     Performed at [facility]   Report Status 11/27/2013 FINAL   Final  CULTURE, BLOOD  (ROUTINE X 2)     Status: None   Collection Time    11/21/13  9:50 AM      Result Value Ref Range Status   Specimen Description BLOOD RIGHT ARM   Final   Special Requests BOTTLES DRAWN AEROBIC AND ANAEROBIC 5 CC EACH   Final   Culture  Setup Time     Final   Value: 11/21/2013 13:22     Performed at [facility]   Culture     Final   Value: NO GROWTH 5 DAYS     Performed at [facility]   Report Status 11/27/2013 FINAL   Final  TECHNOLOGIST REVIEW     Status: None   Collection Time    12/06/13  9:04 AM      Result Value Ref Range Status   Technologist Review Rare myelocyte   Final  MRSA PCR SCREENING     Status: Abnormal   Collection Time    12/14/13  5:30 AM      Result Value Ref Range Status   MRSA by PCR POSITIVE (*) NEGATIVE Final   Comment:            The GeneXpert MRSA Assay (FDA     approved for NASAL specimens     only), is one component of a     comprehensive MRSA colonization     surveillance program. It is not     intended to diagnose MRSA     infection nor to guide or     monitor treatment for     MRSA infections.     RESULT CALLED TO, READ BACK BY AND VERIFIED WITH:     Wallene Huh [id number] @ [phone] BY J SCOTTON  CULTURE, BLOOD (ROUTINE X 2)     Status: None   Collection Time    12/14/13  7:55 AM      Result Value Ref Range Status   Specimen Description BLOOD RIGHT HAND   Final   Special Requests BOTTLES DRAWN AEROBIC ONLY 5 CC   Final   Culture  Setup Time     Final   Value: 12/14/2013 15:22     Performed at [facility]   Culture     Final   Value:        BLOOD CULTURE RECEIVED NO GROWTH TO DATE CULTURE WILL BE HELD FOR 5  DAYS BEFORE ISSUING A FINAL NEGATIVE REPORT     Performed at [facility]   Report Status PENDING   Incomplete  CULTURE, BLOOD (ROUTINE X 2)     Status: None   Collection Time    12/14/13 10:55 AM      Result Value Ref Range Status   Specimen Description BLOOD LEFT PORTA CATH   Final   Special Requests      Final   Value: BOTTLES DRAWN AEROBIC AND ANAEROBIC 10CC IMMUNOCOMPROMISED   Culture  Setup Time     Final   Value: 12/14/2013 18:15     Performed at [facility]   Culture     Final   Value:        BLOOD CULTURE RECEIVED NO GROWTH TO DATE CULTURE WILL BE HELD FOR 5 DAYS BEFORE ISSUING A FINAL NEGATIVE REPORT     Performed at [facility]   Report Status PENDING   Incomplete  WOUND CULTURE     Status: None   Collection Time    12/14/13 11:15 AM      Result Value Ref Range Status   Specimen Description WOUND ABDOMINAL WOUND   Final   Special Requests NONE   Final   Gram Stain PENDING   Incomplete   Culture     Final   Value: Culture reincubated for better growth     Performed at [facility]   Report Status PENDING   Incomplete  CLOSTRIDIUM DIFFICILE BY PCR     Status: Abnormal   Collection Time    12/15/13  9:24 AM      Result Value Ref Range Status   C difficile by pcr POSITIVE (*) NEGATIVE Final   Comment: CRITICAL RESULT CALLED TO, READ BACK BY AND VERIFIED WITH:     A.ARNOLD,RN 9518 12/15/13 M.CAMPBELL     Performed at [facility]     Impression/Recommendation  Principal Problem:   Hypovolemia dehydration Active Problems:   Atrial fibrillation with rapid ventricular response   Hypotension   Chronic hyponatremia   CMML (chronic myelomonocytic leukemia)   Open abdominal wall wound   Hypocalcemia   Metabolic acidosis   MRSA carrier   COPD (chronic obstructive pulmonary disease)   Anemia of chronic disease   Leukocytosis, unspecified   Shock   Nausea   Leliana Nesmith is a 63 y.o. female with CML recently with admission with sepsis, with MRSA found in urine (but not simultaneous blood cultures) pulnonary nodules sp TEE, sp zyvox for more than 2 weeks now with admission with septic shock again, AKI, now improved with IVF, continued zyvox, aztreonam and now added DIFICID with her CDIFF PCR being positive  #1 Clostridium difficile  enteritis  with ? Increased stool output: --agree with DIFICID and I added IV flagyl given adynamic ileus --could consider vancomycin per the ostomy bag since near none would be systemically absorbed but will hold off given concern for allergy  #2 Sepsis: I would think this is largely due to her CDifficile and ileus unless there has also been gut translocation into blood of microbes or unless she really has other source for her sepsis  --ok to continue zyvox --I will dc the aztreonam for now --followup cultures  #3 Pulmonary nodules in context of MRSA in the urine: I had planned to give her a month of anti-MRSA antibiotics but if abx are precipitating CDIff I would consider just stopping them. Will keep them for now until the "  dust settles"  --consider repeat chest CT  12/15/2013, 3:53 PM   Thank you so much for this interesting consult  [place] [place] [phone] (pager) [phone] (office) 12/15/2013, 3:53 PM  Rhina Brackett [place] 12/15/2013, 3:53 PM

## 2013-12-15 NOTE — Progress Notes (Addendum)
PULMONARY  / CRITICAL CARE MEDICINE CONSULTATION  Name: [name] MRN: [id number] DOB: 02-Apr-1951    ADMISSION DATE:  12/14/2013 CONSULTATION DATE: 12/14/2013  REQUESTING CLINICIAN: Dr. 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  SIGNIFICANT EVENTS / STUDIES:   LINES / TUBES: L IJ CVL 8/29 >>>  CULTURES: Urine 8/4 >>> MRSA Urine 8/29 >>> Blood 8/29 >>>  Abdominal wound 8/29 >>>  ANTIBIOTICS: Doxycycline 8/29 >>> 8/30 Aztreonam 8/29 >>> Zyvox 8/30 >>>  INTERVAL HISTORY:  No overnight issues.  VITAL SIGNS: Temp:  [97.9 F (36.6 C)-99 F (37.2 C)] 98 F (36.7 C) (08/30 0400) Pulse Rate:  [29-115] 94 (08/30 0600) Resp:  [14-23] 20 (08/30 0800) BP: (85-132)/(52-108) 104/75 mmHg (08/30 0800) SpO2:  [97 %-100 %] 99 % (08/30 0800) Weight:  [64.2 kg (141 lb 8.6 oz)] 64.2 kg (141 lb 8.6 oz) (08/30 0100)  HEMODYNAMICS: CVP:  [5 mmHg-7 mmHg] 6 mmHg  VENTILATOR SETTINGS:   INTAKE / OUTPUT: Intake/Output     08/29 0701 - 08/30 0700 08/30 0701 - 08/31 0700   P.O. 480    I.V. (mL/kg) 2856.1 (44.5) 110 (1.7)   Blood 330    IV Piggyback 1600    Total Intake(mL/kg) 5266.1 (82) 110 (1.7)   Urine (mL/kg/hr) 580 (0.4) 125 (1.2)   Emesis/NG output 30 (0)    Stool 1475 (1) 100 (0.9)   Total Output 2085 225   Net +3181.1 -115         PHYSICAL EXAMINATION: General:  Comfortable Neuro:  Awake, alert, improved pain control HEENT: Dry membranes Neck: No JVD Cardiovascular: Regular, no murmurs Abdomen:  Soft, dry wound dressing Musculoskeletal:  No edema Skin:  No rash  LABS:  CBC  Recent Labs Lab 12/14/13 0215 12/15/13 0505  WBC 16.5* 25.5*  HGB 7.4* 7.0*  HCT 22.3* 19.7*  PLT 417* 230   Coag's  Recent Labs Lab 12/14/13 0215  APTT 34  INR 1.32   BMET  Recent Labs Lab  12/14/13 0215 12/15/13 0505  NA 130* 129*  K 5.3 4.1  CL 101 99  CO2 13* 18*  BUN 20 20  CREATININE 1.38* 1.06  GLUCOSE 99 124*   Electrolytes  Recent Labs Lab 12/14/13 0208 12/14/13 0215 12/15/13 0505  CALCIUM  --  4.8* 4.6*  MG 0.5*  --   --   PHOS 3.7  --   --    Sepsis Markers  Recent Labs Lab 12/14/13 0215  LATICACIDVEN 1.8   ABG No results found for this basename: PHART, PCO2ART, PO2ART,  in the last 168 hours Liver Enzymes  Recent Labs Lab 12/14/13 0215  AST 17  ALT 10  ALKPHOS 306*  BILITOT 0.3  ALBUMIN 2.0*   Cardiac Enzymes No results found for this basename: TROPONINI, PROBNP,  in the last 168 hours  Glucose No results found for this basename: GLUCAP,  in the last 168 hours  IMAGING:   Dg Chest Port 1 View  12/14/2013   CLINICAL DATA:  Status post central line placed  EXAM: PORTABLE CHEST - 1 VIEW  COMPARISON:  12/14/2013 217 hr  FINDINGS: The left jugular central line is been placed with the catheter tip at the cavoatrial junction. No pneumothorax is noted. Cardiac shadow is stable. The lungs are well aerated. Very minimal right basilar atelectasis is noted. No  bony abnormality is seen.  IMPRESSION: No pneumothorax following central line placement.  Minimal right basilar atelectasis.   Electronically Signed   By: Inez Catalina M.D.   On: 12/14/2013 07:42   Dg Chest Port 1 View  12/14/2013   CLINICAL DATA:  Chest pain  EXAM: PORTABLE CHEST - 1 VIEW  COMPARISON:  Prior radiograph from 11/20/2013  FINDINGS: Heart size is stable from prior exam, and remains within normal limits. Tortuosity of the intrathoracic aorta noted.  The lungs are normally inflated. Diffuse prominence of the interstitial markings is stable. No airspace consolidation, pleural effusion, or pulmonary edema is identified. There is no pneumothorax.  No acute osseous abnormality identified.  IMPRESSION: No acute cardiopulmonary abnormality.   Electronically Signed   By: Jeannine Boga M.D.   On: 12/14/2013 02:45   Dg Abd Portable 2v  12/14/2013   CLINICAL DATA:  Abdominal distention.  Dizziness.  Nausea.  EXAM: PORTABLE ABDOMEN - 2 VIEW  COMPARISON:  One-view abdomen 08/24/2013.  FINDINGS: The stent loops of large and small bowel are again seen. Fluid levels are present on the decubitus image. There is no free air. Degenerative changes are present in the thoracolumbar spine with stable scoliosis.  IMPRESSION: 1. Ten loops of large and small bowel with fluid levels but no obstruction. The finding is most compatible with an adynamic ileus. 2. No free air. 3. Stable degenerative change in scoliosis in the lumbar spine.   Electronically Signed   By: Lawrence Santiago M.D.   On: 12/14/2013 02:39   ASSESSMENT / PLAN:  PULMONARY A: No active issues P:   Goal SpO2>92 Supplemental oxygen PRN  CARDIOVASCULAR A: Possible SIRS / sepsis vs hypovolemic hypotension Lactate is  reassuring AF with RVR, converted to SR 8/30 P:   Goal MAP > 65, HR<120 Neo-Synephrine PRN Metoprolol PRN  RENAL A: AKI, resolving Hypovolemia Hypocalcemia ( ionized Ca 0.65 on 8/29 ) Mypomagnesemia Metabolic acidosis secondary to bicarbonate loss, improving P:   Goal CVP 8-10 Bicarbonate gtt to 100 mL/h Ca 1 Mg 4 NS 500 x 1 and PRN for goal CVP  GASTROINTESTINAL A: S/p ileostomy with high autput  Ileus GERD P:   Liquid diet Protonix Florastor Sucralfate  HEMATOLOGIC A: Anemia CML VTE Px P:   Goal Hb>=7 Lovenox  INFECTIOUS A: Recent MRSA UTI Draining abdominal wound with h/o Pseudomonas infection PCN / vancomycin allergy Increasing WBC in spite of abx P:   D/c Doxycycline 8/29 >>> 8/30 Aztreonam 8/29 Add Zyvox 8/30 Follow blood cx / wound cx / urine cx Follow C.dif PCR  ENDOCRINE A: Adrenal insufficiency rule out ( cortisol 70 on 8/29 ) P:   No intervention required  NEUROLOGIC A: Pain Depression P: Oxycodone / Fentanyl PRN Hold Wellbutrin /  Flexeryl while on Zyvox ( at risk for serotonin syndrome )  I have personally obtained history, examined patient, evaluated and interpreted laboratory and imaging results, reviewed medical records, formulated assessment / plan and placed orders.  CRITICAL CARE:  The patient is critically ill with multiple organ systems failure and requires high complexity decision making for assessment and support, frequent evaluation and titration of therapies, application of advanced monitoring technologies and extensive interpretation of multiple databases. Additional Critical Care Time devoted to patient care services described in this note is 35 minutes.   Doree Fudge, MD [facility] [place] Pager: [phone]  12/15/2013, 8:40 AM

## 2013-12-15 NOTE — Progress Notes (Signed)
[facility] PROGRESS NOTE  [name] HTD:[id number] DOB: 1950/07/28 DOA: 12/14/2013 PCP: Darlin Coco, MD  Assessment/Plan: 1. Hypovolemia dehydration/Shock/ Hypotension- due to High Output Ostomy and c. Diff infection -continue IVFs for Fluid Resuscitation  -central line in place and will target 8-10 on CVP -PCCM on board and helping with management -with follow clinical response -Id consulted   2. Atrial fibrillation with rapid ventricular response  -Due to #1  -continue IVFs, metoprolol, cardizem and digoxin -heart rate in the 90's  3. Hypocalcemia- due to loss from [employer] and poor nutrition -continue calcium repletion -continue tums -follow electrolytes  4. 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 CMML (chronic myelomonocytic leukemia)  -stable  -continue follow up with oncology service -Hgb stable (7.0); hemodilution playing role  6. Open abdominal wall wound  -Wound care evaluation  -no drainage or superimposed signs of infection  7. Metabolic acidosis- Due to High Output Ostomy, and Resolving Infection   -most likely triggered by c. Diff infection and diarrhea -will treat c. Diff -continue bicarb drip  8. COPD (chronic obstructive pulmonary disease)  -Monitor O2 Sats and Albuterol Nebs PRN  -prn oxygen supplementation  9. Anemia of chronic disease -  -follow anemia panel  -status post 1 units of PRBC's; Hgb 7.0 -will hold further transfusion today; patient w/o overt bleeding and has received approx 7L of fluids (hemodilution playing role)  10. C. Diff enteritis: will treat with difficid   -severe infection base on algorithm -patient allergic to vanc -ID consulted   DVT: lovenox  Code Status: Full Family Communication: daughter at bedside Disposition Plan: to be determine. remains in  stepdown   Consultants:  PCCM  ID  Procedures:  See below for x-ray reports   Antibiotics: Doxycycline 8/29--8/30 Aztreonam 8/29 zyvox 8/30  HPI/Subjective: Feeling better; still with increase ostomy output and abd pain. No fever this morning. WBC's 25,000  Objective: Filed Vitals:   12/15/13 1200  BP: 119/70  Pulse: 94  Temp:   Resp: 18    Intake/Output Summary (Last 24 hours) at 12/15/13 1359 Last data filed at 12/15/13 1200  Gross per 24 hour  Intake   5730 ml  Output   2905 ml  Net   2825 ml   Filed Weights   12/14/13 0404 12/15/13 0100  Weight: 65.8 kg (145 lb 1 oz) 64.2 kg (141 lb 8.6 oz)    Exam:   General:  Feeling better; still with some abd discomfort and high ostomy output. Denies CP, vomiting, melena and hematochezia.   Cardiovascular:HR in the high 90's now, no rubs or gallops  Respiratory: no wheezing, no crackles  Abdomen: soft, No distended, ileostomy in place (plenty of output); positive BS; no guarding. abd wound w/o superimposed signs of infections or active drainage  Musculoskeletal: no swelling or erythema  Data Reviewed: Basic Metabolic Panel:  Recent Labs Lab 12/14/13 0208 12/14/13 0215 12/15/13 0505  NA  --  130* 129*  K  --  5.3 4.1  CL  --  101 99  CO2  --  13* 18*  GLUCOSE  --  99 124*  BUN  --  20 20  CREATININE  --  1.38* 1.06  CALCIUM  --  4.8* 4.6*  MG 0.5*  --   --   PHOS 3.7  --   --    Liver Function Tests:  Recent Labs Lab 12/14/13 0215  AST 17  ALT 10  ALKPHOS 306*  BILITOT 0.3  PROT 5.7*  ALBUMIN 2.0*   CBC:  Recent Labs Lab 12/14/13 0215 12/15/13 0505  WBC 16.5* 25.5*  NEUTROABS 10.4*  --   HGB 7.4* 7.0*  HCT 22.3* 19.7*  MCV 93.3 90.4  PLT 417* 230   BNP (last 3 results)  Recent Labs  03/24/13 2031 03/25/13 0334 03/26/13 0414  PROBNP 9254.0* 9403.0* 4535.0*   CBG: No results found for this basename: GLUCAP,  in the last 168 hours  Recent Results (from the past 240  hour(s))  TECHNOLOGIST REVIEW     Status: None   Collection Time    12/06/13  9:04 AM      Result Value Ref Range Status   Technologist Review Rare myelocyte   Final  MRSA PCR SCREENING     Status: Abnormal   Collection Time    12/14/13  5:30 AM      Result Value Ref Range Status   MRSA by PCR POSITIVE (*) NEGATIVE Final   Comment:            The GeneXpert MRSA Assay (FDA     approved for NASAL specimens     only), is one component of a     comprehensive MRSA colonization     surveillance program. It is not     intended to diagnose MRSA     infection nor to guide or     monitor treatment for     MRSA infections.     RESULT CALLED TO, READ BACK BY AND VERIFIED WITH:     Wallene Huh [id number] @ [phone] BY J SCOTTON  CULTURE, BLOOD (ROUTINE X 2)     Status: None   Collection Time    12/14/13  7:55 AM      Result Value Ref Range Status   Specimen Description BLOOD RIGHT HAND   Final   Special Requests BOTTLES DRAWN AEROBIC ONLY 5 CC   Final   Culture  Setup Time     Final   Value: 12/14/2013 15:22     Performed at [facility]   Culture     Final   Value:        BLOOD CULTURE RECEIVED NO GROWTH TO DATE CULTURE WILL BE HELD FOR 5 DAYS BEFORE ISSUING A FINAL NEGATIVE REPORT     Performed at [facility]   Report Status PENDING   Incomplete  CULTURE, BLOOD (ROUTINE X 2)     Status: None   Collection Time    12/14/13 10:55 AM      Result Value Ref Range Status   Specimen Description BLOOD LEFT PORTA CATH   Final   Special Requests     Final   Value: BOTTLES DRAWN AEROBIC AND ANAEROBIC 10CC IMMUNOCOMPROMISED   Culture  Setup Time     Final   Value: 12/14/2013 18:15     Performed at [facility]   Culture     Final   Value:        BLOOD CULTURE RECEIVED NO GROWTH TO DATE CULTURE WILL BE HELD FOR 5 DAYS BEFORE ISSUING A FINAL NEGATIVE REPORT     Performed at [facility]   Report Status PENDING   Incomplete  WOUND CULTURE     Status: None    Collection Time    12/14/13 11:15 AM      Result Value Ref Range Status   Specimen Description WOUND  ABDOMINAL WOUND   Final   Special Requests NONE   Final   Gram Stain PENDING   Incomplete   Culture     Final   Value: Culture reincubated for better growth     Performed at [facility]   Report Status PENDING   Incomplete  CLOSTRIDIUM DIFFICILE BY PCR     Status: Abnormal   Collection Time    12/15/13  9:24 AM      Result Value Ref Range Status   C difficile by pcr POSITIVE (*) NEGATIVE Final   Comment: CRITICAL RESULT CALLED TO, READ BACK BY AND VERIFIED WITH:     A.ARNOLD,RN [id number] 12/15/13 M.CAMPBELL     Performed at [facility]     Studies: Dg Chest Port 1 View  12/14/2013   CLINICAL DATA:  Status post central line placed  EXAM: PORTABLE CHEST - 1 VIEW  COMPARISON:  12/14/2013 217 hr  FINDINGS: The left jugular central line is been placed with the catheter tip at the cavoatrial junction. No pneumothorax is noted. Cardiac shadow is stable. The lungs are well aerated. Very minimal right basilar atelectasis is noted. No bony abnormality is seen.  IMPRESSION: No pneumothorax following central line placement.  Minimal right basilar atelectasis.   Electronically Signed   By: Inez Catalina M.D.   On: 12/14/2013 07:42   Dg Chest Port 1 View  12/14/2013   CLINICAL DATA:  Chest pain  EXAM: PORTABLE CHEST - 1 VIEW  COMPARISON:  Prior radiograph from 11/20/2013  FINDINGS: Heart size is stable from prior exam, and remains within normal limits. Tortuosity of the intrathoracic aorta noted.  The lungs are normally inflated. Diffuse prominence of the interstitial markings is stable. No airspace consolidation, pleural effusion, or pulmonary edema is identified. There is no pneumothorax.  No acute osseous abnormality identified.  IMPRESSION: No acute cardiopulmonary abnormality.   Electronically Signed   By: Jeannine Boga M.D.   On: 12/14/2013 02:45   Dg Abd Portable 2v  12/14/2013    CLINICAL DATA:  Abdominal distention.  Dizziness.  Nausea.  EXAM: PORTABLE ABDOMEN - 2 VIEW  COMPARISON:  One-view abdomen 08/24/2013.  FINDINGS: The stent loops of large and small bowel are again seen. Fluid levels are present on the decubitus image. There is no free air. Degenerative changes are present in the thoracolumbar spine with stable scoliosis.  IMPRESSION: 1. Ten loops of large and small bowel with fluid levels but no obstruction. The finding is most compatible with an adynamic ileus. 2. No free air. 3. Stable degenerative change in scoliosis in the lumbar spine.   Electronically Signed   By: Lawrence Santiago M.D.   On: 12/14/2013 02:39    Scheduled Meds: . sodium chloride   Intravenous Once  . sodium chloride   Intravenous Once  . antiseptic oral rinse  7 mL Mouth Rinse BID  . aspirin  325 mg Oral q morning - 10a  . aztreonam  2 g Intravenous 3 times per day  . calcium carbonate  1 tablet Oral TID  . digoxin  0.125 mg Intravenous Daily  . enoxaparin (LOVENOX) injection  40 mg Subcutaneous Q24H  . estradiol  2 mg Oral Daily  . famotidine  40 mg Oral Daily  . fidaxomicin  200 mg Oral BID  . linezolid  600 mg Intravenous Q12H  . [START ON 12/16/2013] magnesium oxide  400 mg Oral BID  . saccharomyces boulardii  250 mg Oral BID  . sodium  chloride  1,000 mL Intravenous Once  . sucralfate  1 g Oral TID WC & HS   Continuous Infusions: .  sodium bicarbonate  infusion 1000 mL 100 mL/hr at 12/15/13 0506    Principal Problem:   Hypovolemia dehydration Active Problems:   Atrial fibrillation with rapid ventricular response   Hypotension   Chronic hyponatremia   CMML (chronic myelomonocytic leukemia)   Open abdominal wall wound   Hypocalcemia   Metabolic acidosis   MRSA carrier   COPD (chronic obstructive pulmonary disease)   Anemia of chronic disease   Leukocytosis, unspecified   Shock   Nausea    Time spent: >30 minutes    Barton Dubois  [facility] Pager  [phone]. If 7PM-7AM, please contact night-coverage at [email or web address], password [facility] 12/15/2013, 1:59 PM  LOS: 1 day

## 2013-12-16 DIAGNOSIS — N390 Urinary tract infection, site not specified: Secondary | ICD-10-CM

## 2013-12-16 DIAGNOSIS — E860 Dehydration: Secondary | ICD-10-CM

## 2013-12-16 DIAGNOSIS — R197 Diarrhea, unspecified: Secondary | ICD-10-CM

## 2013-12-16 DIAGNOSIS — Z932 Ileostomy status: Secondary | ICD-10-CM

## 2013-12-16 DIAGNOSIS — E538 Deficiency of other specified B group vitamins: Secondary | ICD-10-CM

## 2013-12-16 DIAGNOSIS — N179 Acute kidney failure, unspecified: Secondary | ICD-10-CM

## 2013-12-16 LAB — BASIC METABOLIC PANEL
Anion gap: 13 (ref 5–15)
BUN: 13 mg/dL (ref 6–23)
CO2: 22 mEq/L (ref 19–32)
Calcium: 4.5 mg/dL — CL (ref 8.4–10.5)
Chloride: 101 mEq/L (ref 96–112)
Creatinine, Ser: 0.73 mg/dL (ref 0.50–1.10)
GFR calc Af Amer: >90 mL/min (ref >90)
GFR calc non Af Amer: 88 mL/min — ABNORMAL LOW (ref >90)
GLUCOSE: 107 mg/dL — AB (ref 70–99)
POTASSIUM: 3.9 meq/L (ref 3.7–5.3)
SODIUM: 136 meq/L — AB (ref 137–147)

## 2013-12-16 LAB — CBC
HEMATOCRIT: 21.3 % — AB (ref 36.0–46.0)
Hemoglobin: 7.4 g/dL — ABNORMAL LOW (ref 12.0–15.0)
MCH: 32 pg (ref 26.0–34.0)
MCHC: 34.7 g/dL (ref 30.0–36.0)
MCV: 92.2 fL (ref 78.0–100.0)
Platelets: 224 10*3/uL (ref 150–400)
RBC: 2.31 MIL/uL — AB (ref 3.87–5.11)
RDW: 18.7 % — ABNORMAL HIGH (ref 11.5–15.5)
WBC: 9.5 10*3/uL (ref 4.0–10.5)

## 2013-12-16 LAB — MAGNESIUM: MAGNESIUM: 1.2 mg/dL — AB (ref 1.5–2.5)

## 2013-12-16 LAB — PHOSPHORUS: PHOSPHORUS: 2.1 mg/dL — AB (ref 2.3–4.6)

## 2013-12-16 MED ORDER — METOPROLOL TARTRATE 1 MG/ML IV SOLN
2.5000 mg | Freq: Once | INTRAVENOUS | Status: AC
  Administered 2013-12-16: 2.5 mg via INTRAVENOUS

## 2013-12-16 MED ORDER — METOPROLOL TARTRATE 1 MG/ML IV SOLN
INTRAVENOUS | Status: AC
  Filled 2013-12-16: qty 5

## 2013-12-16 MED ORDER — SUCRALFATE 1 G PO TABS
1.0000 g | ORAL_TABLET | Freq: Three times a day (TID) | ORAL | Status: DC
  Administered 2013-12-16 – 2013-12-22 (×24): 1 g via ORAL
  Filled 2013-12-16 (×29): qty 1

## 2013-12-16 MED ORDER — ADULT MULTIVITAMIN W/MINERALS CH
1.0000 | ORAL_TABLET | Freq: Every day | ORAL | Status: DC
  Administered 2013-12-16 – 2013-12-21 (×5): 1 via ORAL
  Filled 2013-12-16 (×7): qty 1

## 2013-12-16 MED ORDER — PROMETHAZINE HCL 25 MG/ML IJ SOLN
12.5000 mg | Freq: Four times a day (QID) | INTRAMUSCULAR | Status: DC | PRN
  Administered 2013-12-16: 12.5 mg via INTRAVENOUS
  Filled 2013-12-16: qty 1

## 2013-12-16 NOTE — Progress Notes (Signed)
PULMONARY  / CRITICAL CARE MEDICINE CONSULTATION  Name: [name] MRN: [id number] DOB: Sep 02, 1950    ADMISSION DATE:  12/14/2013 CONSULTATION DATE: 12/14/2013  REQUESTING CLINICIAN: Dr. 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 +  cdiff.  SIGNIFICANT EVENTS / STUDIES:   LINES / TUBES: L IJ CVL 8/29 >>>  CULTURES: Urine 8/4 >>> MRSA Urine 8/29 >>> Blood 8/29 >>>  Abdominal wound 8/29 >>> c  Diff 8/30>>++ ANTIBIOTICS: Per ID as of 8/30  Doxycycline 8/29 >>> 8/30 Aztreonam 8/29 >>>8/30 Zyvox 8/30 >>> Dificid 8/30>> Flagyl 8/30>>    INTERVAL HISTORY:  Feels better  VITAL SIGNS: Temp:  [97.5 F (36.4 C)-98.6 F (37 C)] 98.6 F (37 C) (08/31 0829) Pulse Rate:  [58-121] 97 (08/31 0800) Resp:  [13-23] 21 (08/31 0800) BP: (93-140)/(55-84) 111/74 mmHg (08/31 0750) SpO2:  [97 %-100 %] 100 % (08/31 0800) Weight:  [145 lb 3.2 oz (65.862 kg)] 145 lb 3.2 oz (65.862 kg) (08/31 0500)  HEMODYNAMICS: CVP:  [7 mmHg] 7 mmHg  VENTILATOR SETTINGS:   INTAKE / OUTPUT: Intake/Output     08/30 0701 - 08/31 0700 08/31 0701 - 09/01 0700   P.O. 1460    I.V. (mL/kg) 3140 (47.7) 110 (1.7)   Blood     IV Piggyback 900 100   Total Intake(mL/kg) 5500 (83.5) 210 (3.2)   Urine (mL/kg/hr) 1100 (0.7) 500 (4.6)   Emesis/NG output     Stool 2450 (1.5)    Total Output 3550 500   Net +1950 -290         PHYSICAL EXAMINATION: General:  Comfortable, reports feeling better, wasted. Neuro:  Awake, alert, adequate pain control HEENT: Dry membranes Neck: No JVD Cardiovascular: Regular, no murmurs, converted from Afib to SR Abdomen:  Soft, dry wound dressing, ileostomy with liquid drainage Musculoskeletal:  No edema Skin:  No rash  LABS:  CBC  Recent Labs Lab 12/14/13 0215 12/15/13 0505 12/16/13 0545  WBC  16.5* 25.5* 9.5  HGB 7.4* 7.0* 7.4*  HCT 22.3* 19.7* 21.3*  PLT 417* 230 224   Coag's  Recent Labs Lab 12/14/13 0215  APTT 34  INR 1.32   BMET  Recent Labs Lab 12/14/13 0215 12/15/13 0505 12/16/13 0545  NA 130* 129* 136*  K 5.3 4.1 3.9  CL 101 99 101  CO2 13* 18* 22  BUN 20 20 13   CREATININE 1.38* 1.06 0.73  GLUCOSE 99 124* 107*   Electrolytes  Recent Labs Lab 12/14/13 0208 12/14/13 0215 12/15/13 0505 12/16/13 0545  CALCIUM  --  4.8* 4.6* 4.5*  MG 0.5*  --   --  1.2*  PHOS 3.7  --   --  2.1*   Sepsis Markers  Recent Labs Lab 12/14/13 0215  LATICACIDVEN 1.8   ABG No results found for this basename: PHART, PCO2ART, PO2ART,  in the last 168 hours Liver Enzymes  Recent Labs Lab 12/14/13 0215  AST 17  ALT 10  ALKPHOS 306*  BILITOT 0.3  ALBUMIN 2.0*   Cardiac Enzymes No results found for this basename: TROPONINI, PROBNP,  in the last 168 hours  Glucose No results found for this basename: GLUCAP,  in the last 168 hours  IMAGING:   No results found. ASSESSMENT / PLAN:  PULMONARY A: No active issues P:   Goal SpO2>92 Supplemental oxygen PRN  CARDIOVASCULAR  A: Possible SIRS / sepsis vs hypovolemic hypotension Lactate is  reassuring AF with RVR, converted to SR 8/30 P:   Goal MAP > 65, HR<120 Neo-Synephrine PRN Metoprolol PRN Adequate hydration with high output ostomy ++ cdiff  RENAL  A: AKI, resolved Hypovolemia Hypocalcemia ( correctedCa 0.61 on 8/31 ) Hypomagnesemia (9/45 1.2) Metabolic acidosis secondary to bicarbonate loss, improving co2 22 P:   Goal CVP 8-10 Bicarbonate gtt to 75 mL/h On mg/ca replacement  NS 500   PRN for goal CVP  GASTROINTESTINAL A: S/p ileostomy with high autput  Ileus GERD P:   Liquid diet Protonix Florastor Sucralfate  HEMATOLOGIC A: Anemia CML VTE Px P:   Goal Hb>=7 Lovenox  INFECTIOUS A: Recent MRSA UTI Draining abdominal wound with h/o Pseudomonas infection PCN /  vancomycin allergy Increasing WBC in spite of abx Dx with Cdiff 8/30 ID consult 8/30 P:   D/c Doxycycline 8/29 >>> 8/30 Aztreonam 8/29>>8/30 Add Zyvox 8/30 Add dificid 8/30(ID) Flagyl added 8/30>> Follow blood cx / wound cx / urine cx +++ C.dif PCR  ENDOCRINE A: Adrenal insufficiency rule out ( cortisol 70 on 8/29 ) P:   No intervention required  NEUROLOGIC A: Pain Depression P: Oxycodone / Fentanyl PRN Hold Wellbutrin / Flexeryl while on Zyvox ( at risk for serotonin syndrome )    Consider tx out of ICU ? PCCM sign off   Richardson Landry Destinee Taber ACNP Maryanna Shape PCCM Pager [phone] till 3 pm If no answer page [phone] 12/16/2013, 8:52 AM  I have personally obtained history, examined patient, evaluated and interpreted laboratory and imaging results, reviewed medical records, formulated assessment / plan and placed orders.  CRITICAL CARE:  The patient is critically ill with multiple organ systems failure and requires high complexity decision making for assessment and support, frequent evaluation and titration of therapies, application of advanced monitoring technologies and extensive interpretation of multiple databases. Additional Critical Care Time devoted to patient care services described in this note is 35 minutes.

## 2013-12-16 NOTE — Progress Notes (Signed)
Agree with NP S. Minor assessment and plan.  Patient seen an examined with NP.  Critical Care time = 35 mins  Vilinda Boehringer, MD [place] [facility] Pager [phone] On Call Pager [phone]

## 2013-12-16 NOTE — Progress Notes (Signed)
eLink Physician-Brief Progress Note Patient Name: [name] DOB: [birth year] MRN: [id number]   Date of Service  12/16/2013  HPI/Events of Note  Pt with hx of A fib.  Now with RVR.  BP 102/55.    eICU Interventions  Will give 2.5 mg lopressor IV x one.     Intervention Category Major Interventions: Other:  [name] 12/16/2013, 12:46 AM

## 2013-12-16 NOTE — Progress Notes (Signed)
[facility] PROGRESS NOTE  [name] JSH:[id number] DOB: 13-Jul-1950 DOA: 12/14/2013 PCP: Darlin Coco, MD  Assessment/Plan: 1. Hypovolemia dehydration/Shock/ Hypotension- due to High Output Ostomy and c. Diff infection -continue IVFs for Fluid Resuscitation  -central line in place and will target 8-10 on CVP -with follow clinical response; if remains stable will transfer out of the unit on 9/1 -ID on board will follow recommendations for antibiotic adjustment in length of therapy.   2. Atrial fibrillation with rapid ventricular response  -Due to #1  -continue IVFs, metoprolol, cardizem and digoxin -heart rate in the 90's; who has now converted to sinus rhythm.  3. Hypocalcemia and hypomagnesemia: due to loss from [employer] and poor nutrition -continue calcium and magnesium repletion -continue tums -follow electrolytes  4. 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 On 8/31 sodium 136  5. CMML (chronic myelomonocytic leukemia)  -stable  -continue follow up with oncology service as an outpatient -Hgb stable (7.4); hemodilution playing role  6. Open abdominal wall wound  -Will follow Wound care team recommendations   -no drainage or superimposed signs of infection  7. Metabolic acidosis- Due to High Output Ostomy, and Resolving Infection   -most likely triggered by c. Diff infection and diarrhea -will treat c. Diff -continue bicarb drip but will adjust rate to 75 cc per hour;   8. COPD (chronic obstructive pulmonary disease)  -Monitor O2 Sats and Albuterol Nebs PRN  -prn oxygen supplementation  9. Anemia of chronic disease -  -follow anemia panel  -status post 1 units of PRBC's; Hgb 7.4 (stable and requiring further transfusion at this moment; 8/31). -will hold further transfusion today; patient w/o overt bleeding and has received approx 7L of  fluids (hemodilution playing role)  10. C. Diff enteritis: will treat with dificid and IV flagyl -severe infection base on algorithm -Will add florastor -avoiding protonix due to C. Diff infection -patient allergic to vanc -ID consulted will follow recommendations  DVT: lovenox  Code Status: Full Family Communication: daughter at bedside Disposition Plan: to be determine. remains in stepdown   Consultants:  PCCM  ID  Procedures:  See below for x-ray reports   Antibiotics: Doxycycline 8/29--8/30 Aztreonam 8/29 zyvox 8/30 IV Flagyl 8/30 Dificid 8/30  HPI/Subjective: Feeling better; still with intermittent episode of nausea and abdominal discomfort; increase ileostomy output still present. No fever   Objective: Filed Vitals:   12/16/13 1615  BP: 130/91  Pulse: 25  Temp:   Resp: 13    Intake/Output Summary (Last 24 hours) at 12/16/13 1701 Last data filed at 12/16/13 1700  Gross per 24 hour  Intake 4263.67 ml  Output   3680 ml  Net 583.67 ml   Filed Weights   12/14/13 0404 12/15/13 0100 12/16/13 0500  Weight: 65.8 kg (145 lb 1 oz) 64.2 kg (141 lb 8.6 oz) 65.862 kg (145 lb 3.2 oz)    Exam:   General:  Feeling better; still with some abd discomfort and high ostomy output. Denies CP, vomiting, melena and hematochezia.   Cardiovascular:HR in the high 90's now, but sinus rhythm; no rubs or gallops  Respiratory: no wheezing, no crackles  Abdomen: soft, No distended, ileostomy in place (plenty of output); positive BS; no guarding. abd wound w/o superimposed signs of infections or active drainage  Musculoskeletal: no swelling or erythema  Data Reviewed: Basic Metabolic Panel:  Recent Labs Lab 12/14/13 0208 12/14/13 0215 12/15/13 0505 12/16/13  0545  NA  --  130* 129* 136*  K  --  5.3 4.1 3.9  CL  --  101 99 101  CO2  --  13* 18* 22  GLUCOSE  --  99 124* 107*  BUN  --  20 20 13   CREATININE  --  1.38* 1.06 0.73  CALCIUM  --  4.8* 4.6* 4.5*  MG  0.5*  --   --  1.2*  PHOS 3.7  --   --  2.1*   Liver Function Tests:  Recent Labs Lab 12/14/13 0215  AST 17  ALT 10  ALKPHOS 306*  BILITOT 0.3  PROT 5.7*  ALBUMIN 2.0*   CBC:  Recent Labs Lab 12/14/13 0215 12/15/13 0505 12/16/13 0545  WBC 16.5* 25.5* 9.5  NEUTROABS 10.4*  --   --   HGB 7.4* 7.0* 7.4*  HCT 22.3* 19.7* 21.3*  MCV 93.3 90.4 92.2  PLT 417* 230 224   BNP (last 3 results)  Recent Labs  03/24/13 2031 03/25/13 0334 03/26/13 0414  PROBNP 9254.0* 9403.0* 4535.0*    Recent Results (from the past 240 hour(s))  MRSA PCR SCREENING     Status: Abnormal   Collection Time    12/14/13  5:30 AM      Result Value Ref Range Status   MRSA by PCR POSITIVE (*) NEGATIVE Final   Comment:            The GeneXpert MRSA Assay (FDA     approved for NASAL specimens     only), is one component of a     comprehensive MRSA colonization     surveillance program. It is not     intended to diagnose MRSA     infection nor to guide or     monitor treatment for     MRSA infections.     RESULT CALLED TO, READ BACK BY AND VERIFIED WITH:     Wallene Huh [id number] @ [phone] BY J SCOTTON  CULTURE, BLOOD (ROUTINE X 2)     Status: None   Collection Time    12/14/13  7:55 AM      Result Value Ref Range Status   Specimen Description BLOOD RIGHT HAND   Final   Special Requests BOTTLES DRAWN AEROBIC ONLY 5 CC   Final   Culture  Setup Time     Final   Value: 12/14/2013 15:22     Performed at [facility]   Culture     Final   Value:        BLOOD CULTURE RECEIVED NO GROWTH TO DATE CULTURE WILL BE HELD FOR 5 DAYS BEFORE ISSUING A FINAL NEGATIVE REPORT     Performed at [facility]   Report Status PENDING   Incomplete  CULTURE, BLOOD (ROUTINE X 2)     Status: None   Collection Time    12/14/13 10:55 AM      Result Value Ref Range Status   Specimen Description BLOOD LEFT PORTA CATH   Final   Special Requests     Final   Value: BOTTLES DRAWN AEROBIC AND ANAEROBIC 10CC  IMMUNOCOMPROMISED   Culture  Setup Time     Final   Value: 12/14/2013 18:15     Performed at [facility]   Culture     Final   Value:        BLOOD CULTURE RECEIVED NO GROWTH TO DATE CULTURE WILL BE HELD FOR 5 DAYS BEFORE ISSUING A  FINAL NEGATIVE REPORT     Performed at [facility]   Report Status PENDING   Incomplete  WOUND CULTURE     Status: None   Collection Time    12/14/13 11:15 AM      Result Value Ref Range Status   Specimen Description WOUND ABDOMINAL WOUND   Final   Special Requests NONE   Final   Gram Stain     Final   Value: NO WBC SEEN     NO SQUAMOUS EPITHELIAL CELLS SEEN     MODERATE GRAM POSITIVE COCCI     IN PAIRS IN CLUSTERS IN CHAINS     Performed at [facility]   Culture     Final   Value: MULTIPLE ORGANISMS PRESENT, NONE PREDOMINANT     Performed at [facility]   Report Status PENDING   Incomplete  CLOSTRIDIUM DIFFICILE BY PCR     Status: Abnormal   Collection Time    12/15/13  9:24 AM      Result Value Ref Range Status   C difficile by pcr POSITIVE (*) NEGATIVE Final   Comment: CRITICAL RESULT CALLED TO, READ BACK BY AND VERIFIED WITH:     A.ARNOLD,RN [id number] 12/15/13 M.CAMPBELL     Performed at [facility]     Studies: No results found.  Scheduled Meds: . sodium chloride   Intravenous Once  . sodium chloride   Intravenous Once  . antiseptic oral rinse  7 mL Mouth Rinse BID  . aspirin  325 mg Oral q morning - 10a  . calcium carbonate  1 tablet Oral TID  . digoxin  0.125 mg Intravenous Daily  . enoxaparin (LOVENOX) injection  40 mg Subcutaneous Q24H  . estradiol  2 mg Oral Daily  . famotidine  40 mg Oral Daily  . fidaxomicin  200 mg Oral BID  . linezolid  600 mg Intravenous Q12H  . magnesium oxide  400 mg Oral BID  . metronidazole  500 mg Intravenous Q8H  . multivitamin with minerals  1 tablet Oral Daily  . saccharomyces boulardii  250 mg Oral BID  . sodium chloride  1,000 mL Intravenous Once  .  sucralfate  1 g Oral TID WC & HS   Continuous Infusions: .  sodium bicarbonate  infusion 1000 mL 75 mL/hr at 12/16/13 1109    Principal Problem:   Hypovolemia dehydration Active Problems:   Atrial fibrillation with rapid ventricular response   Hypotension   Chronic hyponatremia   CMML (chronic myelomonocytic leukemia)   Open abdominal wall wound   Hypocalcemia   Metabolic acidosis   MRSA carrier   COPD (chronic obstructive pulmonary disease)   Anemia of chronic disease   Leukocytosis, unspecified   Shock   Nausea    Time spent: >30 minutes    Barton Dubois  [facility] Pager [phone]. If 7PM-7AM, please contact night-coverage at [email or web address], password [facility] 12/16/2013, 5:01 PM  LOS: 2 days

## 2013-12-16 NOTE — Progress Notes (Signed)
[facility]  Day # 2 DIFICID  Day # 2  IV flagyl  Day # 26 of zyvox  Subjective: No new complaints   Antibiotics:  Anti-infectives   Start     Dose/Rate Route Frequency Ordered Stop   12/15/13 1600  metroNIDAZOLE (FLAGYL) IVPB 500 mg     500 mg 100 mL/hr over 60 Minutes Intravenous Every 8 hours 12/15/13 1504     12/15/13 1415  fidaxomicin (DIFICID) tablet 200 mg     200 mg Oral 2 times daily 12/15/13 1355 12/25/13 0959   12/15/13 1000  linezolid (ZYVOX) IVPB 600 mg     600 mg 300 mL/hr over 60 Minutes Intravenous Every 12 hours 12/15/13 0843     12/14/13 1100  aztreonam (AZACTAM) 2 g in dextrose 5 % 50 mL IVPB  Status:  Discontinued     2 g 100 mL/hr over 30 Minutes Intravenous 3 times per day 12/14/13 1045 12/15/13 1502   12/14/13 1000  doxycycline (VIBRA-TABS) tablet 100 mg  Status:  Discontinued     100 mg Oral 2 times daily 12/14/13 0410 12/14/13 0520   12/14/13 0600  doxycycline (VIBRAMYCIN) 100 mg in dextrose 5 % 250 mL IVPB  Status:  Discontinued     100 mg 125 mL/hr over 120 Minutes Intravenous Every 12 hours 12/14/13 0520 12/15/13 0843      Medications: Scheduled Meds: . sodium chloride   Intravenous Once  . sodium chloride   Intravenous Once  . antiseptic oral rinse  7 mL Mouth Rinse BID  . aspirin  325 mg Oral q morning - 10a  . calcium carbonate  1 tablet Oral TID  . digoxin  0.125 mg Intravenous Daily  . enoxaparin (LOVENOX) injection  40 mg Subcutaneous Q24H  . estradiol  2 mg Oral Daily  . famotidine  40 mg Oral Daily  . fidaxomicin  200 mg Oral BID  . linezolid  600 mg Intravenous Q12H  . magnesium oxide  400 mg Oral BID  . metronidazole  500 mg Intravenous Q8H  . multivitamin with minerals  1 tablet Oral Daily  . saccharomyces boulardii  250 mg Oral BID  . sodium chloride  1,000 mL Intravenous Once  . sucralfate  1 g Oral TID WC & HS   Continuous Infusions: .  sodium bicarbonate  infusion 1000 mL 75 mL/hr at  12/16/13 1109   PRN Meds:.acetaminophen, acetaminophen, fentaNYL, lip balm, metoprolol, ondansetron (ZOFRAN) IV, ondansetron, oxyCODONE, promethazine    Objective: Weight change: 3 lb 10.6 oz (1.662 kg)  Intake/Output Summary (Last 24 hours) at 12/16/13 1715 Last data filed at 12/16/13 1700  Gross per 24 hour  Intake 4588.67 ml  Output   3680 ml  Net 908.67 ml   Blood pressure 130/91, pulse 25, temperature 98.6 F (37 C), temperature source Oral, resp. rate 13, height 5\' 5"  (1.651 m), weight 145 lb 3.2 oz (65.862 kg), SpO2 100.00%. Temp:  [97.5 F (36.4 C)-98.7 F (37.1 C)] 98.6 F (37 C) (08/31 1600) Pulse Rate:  [25-121] 25 (08/31 1615) Resp:  [13-31] 13 (08/31 1615) BP: (102-140)/(55-91) 130/91 mmHg (08/31 1615) SpO2:  [97 %-100 %] 100 % (08/31 1615) Weight:  [145 lb 3.2 oz (65.862 kg)] 145 lb 3.2 oz (65.862 kg) (08/31 0500)  Physical Exam: General: Alert and awake, oriented x3, not in any acute distress.  HEENT: EOMI, oropharynx clear and without exudate  CVS regular rate, normal r, no murmur rubs or gallops  Chest: clear to auscultation bilaterally, no wheezing, rales or rhonchi  Abdomen: ostomy with liquid stool  Neuro: nonfocal, strength and sensation intact   CBC:  CBC Latest Ref Rng 12/16/2013 12/15/2013 12/14/2013  WBC 4.0 - 10.5 K/uL 9.5 25.5(H) 16.5(H)  Hemoglobin 12.0 - 15.0 g/dL 7.4(L) 7.0(L) 7.4(L)  Hematocrit 36.0 - 46.0 % 21.3(L) 19.7(L) 22.3(L)  Platelets 150 - 400 K/uL 224 230 417(H)       BMET  Recent Labs  12/15/13 0505 12/16/13 0545  NA 129* 136*  K 4.1 3.9  CL 99 101  CO2 18* 22  GLUCOSE 124* 107*  BUN 20 13  CREATININE 1.06 0.73  CALCIUM 4.6* 4.5*     Liver Panel   Recent Labs  12/14/13 0215  PROT 5.7*  ALBUMIN 2.0*  AST 17  ALT 10  ALKPHOS 306*  BILITOT 0.3       Sedimentation Rate No results found for this basename: ESRSEDRATE,  in the last 72 hours C-Reactive Protein No results found for this basename: CRP,   in the last 72 hours  Micro Results: Recent Results (from the past 720 hour(s))  CULTURE, BLOOD (ROUTINE X 2)     Status: None   Collection Time    11/18/13 11:25 PM      Result Value Ref Range Status   Specimen Description BLOOD L ARM   Final   Special Requests BOTTLES DRAWN AEROBIC AND ANAEROBIC 5CC   Final   Culture  Setup Time     Final   Value: 11/19/2013 03:53     Performed at [facility]   Culture     Final   Value: NO GROWTH 5 DAYS     Performed at [facility]   Report Status 11/25/2013 FINAL   Final  CULTURE, BLOOD (ROUTINE X 2)     Status: None   Collection Time    11/18/13 11:30 PM      Result Value Ref Range Status   Specimen Description BLOOD LEFT ANTECUBITAL   Final   Special Requests BOTTLES DRAWN AEROBIC AND ANAEROBIC 5CC   Final   Culture  Setup Time     Final   Value: 11/19/2013 03:54     Performed at [facility]   Culture     Final   Value: NO GROWTH 5 DAYS     Performed at [facility]   Report Status 11/25/2013 FINAL   Final  MRSA PCR SCREENING     Status: Abnormal   Collection Time    11/19/13  2:45 AM      Result Value Ref Range Status   MRSA by PCR POSITIVE (*) NEGATIVE Final   Comment:            The GeneXpert MRSA Assay (FDA     approved for NASAL specimens     only), is one component of a     comprehensive MRSA colonization     surveillance program. It is not     intended to diagnose MRSA     infection nor to guide or     monitor treatment for     MRSA infections.     RESULT CALLED TO, READ BACK BY AND VERIFIED WITH:     JOHNSON RN AT 0510 ON 08.04.15 BY SHUEA  URINE CULTURE     Status: None   Collection Time    11/19/13  8:49 AM      Result Value Ref Range Status  Specimen Description URINE, CATHETERIZED   Final   Special Requests NONE   Final   Culture  Setup Time     Final   Value: 11/19/2013 11:10     Performed at [facility]     Final   Value: >=100,000 COLONIES/ML      Performed at [facility]   Culture     Final   Value: METHICILLIN RESISTANT STAPHYLOCOCCUS AUREUS     Note: RIFAMPIN AND GENTAMICIN SHOULD NOT BE USED AS SINGLE DRUGS FOR TREATMENT OF STAPH INFECTIONS. CRITICAL RESULT CALLED TO, READ BACK BY AND VERIFIED WITH: STEPHANIE DILLON @ 8:31AM 11/21/13 BY DWEEKS     Performed at [facility]   Report Status 11/21/2013 FINAL   Final   Organism ID, Bacteria METHICILLIN RESISTANT STAPHYLOCOCCUS AUREUS   Final  CULTURE, BLOOD (ROUTINE X 2)     Status: None   Collection Time    11/21/13  9:50 AM      Result Value Ref Range Status   Specimen Description BLOOD LEFT HAND   Final   Special Requests BOTTLES DRAWN AEROBIC AND ANAEROBIC [facility] EACH   Final   Culture  Setup Time     Final   Value: 11/21/2013 13:23     Performed at [facility]   Culture     Final   Value: NO GROWTH 5 DAYS     Performed at [facility]   Report Status 11/27/2013 FINAL   Final  CULTURE, BLOOD (ROUTINE X 2)     Status: None   Collection Time    11/21/13  9:50 AM      Result Value Ref Range Status   Specimen Description BLOOD RIGHT ARM   Final   Special Requests BOTTLES DRAWN AEROBIC AND ANAEROBIC 5 CC EACH   Final   Culture  Setup Time     Final   Value: 11/21/2013 13:22     Performed at [facility]   Culture     Final   Value: NO GROWTH 5 DAYS     Performed at [facility]   Report Status 11/27/2013 FINAL   Final  TECHNOLOGIST REVIEW     Status: None   Collection Time    12/06/13  9:04 AM      Result Value Ref Range Status   Technologist Review Rare myelocyte   Final  MRSA PCR SCREENING     Status: Abnormal   Collection Time    12/14/13  5:30 AM      Result Value Ref Range Status   MRSA by PCR POSITIVE (*) NEGATIVE Final   Comment:            The GeneXpert MRSA Assay (FDA     approved for NASAL specimens     only), is one component of a     comprehensive MRSA colonization     surveillance program. It is not      intended to diagnose MRSA     infection nor to guide or     monitor treatment for     MRSA infections.     RESULT CALLED TO, READ BACK BY AND VERIFIED WITH:     Wallene Huh [id number] @ [phone] BY J SCOTTON  CULTURE, BLOOD (ROUTINE X 2)     Status: None   Collection Time    12/14/13  7:55 AM      Result Value Ref Range Status  Specimen Description BLOOD RIGHT HAND   Final   Special Requests BOTTLES DRAWN AEROBIC ONLY 5 CC   Final   Culture  Setup Time     Final   Value: 12/14/2013 15:22     Performed at [facility]   Culture     Final   Value:        BLOOD CULTURE RECEIVED NO GROWTH TO DATE CULTURE WILL BE HELD FOR 5 DAYS BEFORE ISSUING A FINAL NEGATIVE REPORT     Performed at [facility]   Report Status PENDING   Incomplete  CULTURE, BLOOD (ROUTINE X 2)     Status: None   Collection Time    12/14/13 10:55 AM      Result Value Ref Range Status   Specimen Description BLOOD LEFT PORTA CATH   Final   Special Requests     Final   Value: BOTTLES DRAWN AEROBIC AND ANAEROBIC 10CC IMMUNOCOMPROMISED   Culture  Setup Time     Final   Value: 12/14/2013 18:15     Performed at [facility]   Culture     Final   Value:        BLOOD CULTURE RECEIVED NO GROWTH TO DATE CULTURE WILL BE HELD FOR 5 DAYS BEFORE ISSUING A FINAL NEGATIVE REPORT     Performed at [facility]   Report Status PENDING   Incomplete  WOUND CULTURE     Status: None   Collection Time    12/14/13 11:15 AM      Result Value Ref Range Status   Specimen Description WOUND ABDOMINAL WOUND   Final   Special Requests NONE   Final   Gram Stain     Final   Value: NO WBC SEEN     NO SQUAMOUS EPITHELIAL CELLS SEEN     MODERATE GRAM POSITIVE COCCI     IN PAIRS IN CLUSTERS IN CHAINS     Performed at [facility]   Culture     Final   Value: MULTIPLE ORGANISMS PRESENT, NONE PREDOMINANT     Performed at [facility]   Report Status PENDING   Incomplete  CLOSTRIDIUM DIFFICILE BY PCR      Status: Abnormal   Collection Time    12/15/13  9:24 AM      Result Value Ref Range Status   C difficile by pcr POSITIVE (*) NEGATIVE Final   Comment: CRITICAL RESULT CALLED TO, READ BACK BY AND VERIFIED WITH:     A.ARNOLD,RN [id number] 12/15/13 M.CAMPBELL     Performed at [facility]    Studies/Results: No results found.    Assessment/Plan:  Principal Problem:   Hypovolemia dehydration Active Problems:   Atrial fibrillation with rapid ventricular response   Hypotension   Chronic hyponatremia   CMML (chronic myelomonocytic leukemia)   Open abdominal wall wound   Hypocalcemia   Metabolic acidosis   MRSA carrier   COPD (chronic obstructive pulmonary disease)   Anemia of chronic disease   Leukocytosis, unspecified   Shock   Nausea    [name] is a 63 y.o. female with  with CML recently with admission with sepsis, with MRSA found in urine (but not simultaneous blood cultures) pulnonary nodules sp TEE, sp zyvox for 26 days now with admission with septic shock again, AKI, now improved with IVF, continued zyvox, aztreonam and now added DIFICID with her CDIFF PCR being positive   #1 Clostridium difficile enteritis with ? Increased  stool output:  --agree with DIFICID and I added IV flagyl given adynamic ileus  (she claims she has anaphylaxis with vancomycin so I would be reluctant to use even vancomycin per ostomy)  #2 Sepsis: I would think this is largely due to her CDifficile and ileus unless there has also been gut translocation into blood of microbes or unless she really has other source for her sepsis  --will dc zyvox  --followup cultures , clinical status  #3 Pulmonary nodules in context of MRSA in the urine: I had planned to give her a month of anti-MRSA antibiotics. I have looked back and by my count had approximately 26 days of zyvox so will dc it  Consider repeat CT at some point in case these are non infectious or not responding  Dr. Megan Salon will be here  tomorrow to take over the service     LOS: 2 days   Alcide Evener 12/16/2013, 5:15 PM

## 2013-12-16 NOTE — Progress Notes (Signed)
Pt converted to normal sinus rhythm at about 0117 with HR 92, BP 117/72.

## 2013-12-16 NOTE — Progress Notes (Signed)
CRITICAL VALUE ALERT  Critical value received:  Ca 4.5  Date of notification:  12/16/13  Time of notification:  0635  Critical value read back:Yes.    Nurse who received alert:  Jari Favre RN  MD notified (1st page):  E-Link  Time of first page:  [phone]  MD notified (2nd page):  Time of second page:  Responding MD:  Dr Halford Chessman  Time MD responded:  [id number], awaiting new orders

## 2013-12-16 NOTE — Progress Notes (Addendum)
INITIAL NUTRITION ASSESSMENT  DOCUMENTATION CODES Per approved criteria  -Not Applicable   INTERVENTION: - Encouraged increased meal intake, especially bland foods due to nausea - Discussed diet therapy for excess ileostomy output including avoiding lots of concentrated sweets, pt expressed understanding - MVI 1 tablet PO daily - RD to continue to monitor  NUTRITION DIAGNOSIS: Inadequate oral intake related to poor appetite, nausea, vomiting as evidenced by pt report.   Goal: 1. Pt to consume >90% of meals 2. Resolution of nausea 3. Ileostomy output <1L/day   Monitor:  Weights, labs, intake, ileostomy output, nausea   Reason for Assessment: Malnutrition screening tool   63 y.o. female  Admitting Dx: Hypovolemia dehydration  ASSESSMENT: Pt with history of CML, Ischemic Colitis in 01/2012 S/P small bowel resection and Ileostomy placement who presents to the ED with complaints of nausea and ABD Distension and Dizziness worsening over the past week. She has an high output Ostomy and has recurrent hospitalizations for bouts of dehydration and electrolyte derangements.  - Pt known to RD from previous admission - Reports she's been sick for the past month with pt eating only 1/3 of her meals and has had a poor appetite - Sample meals would be whatever she could tolerate including mac & cheese and said she was getting in some protein with every meal - Doesn't like any nutritional supplements, said it makes her vomit - C/o nausea x 1 month with on/off vomiting - Said she had small amount of emesis this morning - Had about 25% of pimento cheese sandwich for lunch today - Unable to perform nutrition focused physical exam as pt wanted to rest - Pt with 2.4L ileostomy output yesterday   Potassium WNL Phosphorus and magnesium low, magnesium oxide    Height: Ht Readings from Last 1 Encounters:  12/14/13 5\' 5"  (1.651 m)    Weight: Wt Readings from Last 1 Encounters:  12/16/13 145  lb 3.2 oz (65.862 kg)    Ideal Body Weight: 125 lbs   % Ideal Body Weight: 116%  Wt Readings from Last 10 Encounters:  12/16/13 145 lb 3.2 oz (65.862 kg)  11/27/13 145 lb 1.6 oz (65.817 kg)  11/27/13 145 lb 1.6 oz (65.817 kg)  10/22/13 132 lb 4.8 oz (60.011 kg)  10/15/13 118 lb (53.524 kg)  10/15/13 118 lb 3.2 oz (53.615 kg)  09/26/13 124 lb 9.6 oz (56.518 kg)  08/28/13 138 lb (62.596 kg)  08/22/13 136 lb 11.2 oz (62.007 kg)  08/21/13 135 lb 12.8 oz (61.598 kg)    Usual Body Weight: 132 pounds last month  % Usual Body Weight: 110%  BMI:  Body mass index is 24.16 kg/(m^2).  Estimated Nutritional Needs: Kcal: 1900-2100 Protein: 100-120g Fluid: 1.9-2.1L/day   Skin: Stage II coccyx pressure ulcer, open abdominal wound   Diet Order: General  EDUCATION NEEDS: -No education needs identified at this time   Intake/Output Summary (Last 24 hours) at 12/16/13 1624 Last data filed at 12/16/13 1445  Gross per 24 hour  Intake 4223.67 ml  Output   3655 ml  Net 568.67 ml    Last BM: 8/31  Labs:   Recent Labs Lab 12/14/13 0208 12/14/13 0215 12/15/13 0505 12/16/13 0545  NA  --  130* 129* 136*  K  --  5.3 4.1 3.9  CL  --  101 99 101  CO2  --  13* 18* 22  BUN  --  20 20 13   CREATININE  --  1.38* 1.06 0.73  CALCIUM  --  4.8* 4.6* 4.5*  MG 0.5*  --   --  1.2*  PHOS 3.7  --   --  2.1*  GLUCOSE  --  99 124* 107*    CBG (last 3)  No results found for this basename: GLUCAP,  in the last 72 hours  Scheduled Meds: . sodium chloride   Intravenous Once  . sodium chloride   Intravenous Once  . antiseptic oral rinse  7 mL Mouth Rinse BID  . aspirin  325 mg Oral q morning - 10a  . calcium carbonate  1 tablet Oral TID  . digoxin  0.125 mg Intravenous Daily  . enoxaparin (LOVENOX) injection  40 mg Subcutaneous Q24H  . estradiol  2 mg Oral Daily  . famotidine  40 mg Oral Daily  . fidaxomicin  200 mg Oral BID  . linezolid  600 mg Intravenous Q12H  . magnesium oxide  400  mg Oral BID  . metronidazole  500 mg Intravenous Q8H  . saccharomyces boulardii  250 mg Oral BID  . sodium chloride  1,000 mL Intravenous Once  . sucralfate  1 g Oral TID WC & HS    Continuous Infusions: .  sodium bicarbonate  infusion 1000 mL 75 mL/hr at 12/16/13 1109    Past Medical History  Diagnosis Date  . Hypertension     She has a past hx of essential  . 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 Inflammation of joint of knee     Since we last last saw her she developed problems with an acute which required surgical drainage by her orthopedist Dr. Durward Fortes.  Marland Kitchen MRSA (methicillin resistant Staphylococcus aureus)     Knee surgery drainage was positive for MRSA and she was treated with 3 weeks of doxycycline successfully.  . Diarrhea     Mild  . Exogenous obesity   . GERD (gastroesophageal reflux disease)     2 hosp.- ischemic colitis - residual of Norovirus, 05/2011- sm. bowel obstruction  . Headache(784.0)     migraine headache on occas, less now than when she was younger   . Arthritis     L hip, back, neck   . History of blood transfusion sept 2013    04/2011- /w ischemic colitis , trouble with matching blood  sept 2013  . Anemia     will see hematology consult prior to surgery, recommended by Dr. Mare Ferrari  . Anemia 12/15/2010  . Ischemic colitis 01/31/2012  . Atrial flutter     during hospitalization, 04/2011- related to anemia & illness/stress   . Pneumonia     04/2011- not hospitalized , pt. denies SOB, changes in chest, breathing  . 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 Dizziness     occasional  . B12 deficiency 02/27/2013  . 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 COPD (chronic obstructive pulmonary disease) 09/26/2013    Past Surgical History  Procedure Laterality Date  . Knee surgery      I&D- 2008, post laceration   . Colonoscopy  05/16/2011    Procedure: COLONOSCOPY;  Surgeon: Winfield Cunas., MD;  Location: [facility];  Service: Endoscopy;  Laterality: N/A;  . Small intestine surgery  1992, 1999  . Laparotomy and lysis of adhesions    . Total hip arthroplasty  10/25/2011    Procedure: TOTAL HIP ARTHROPLASTY;  Surgeon: Garald Balding, MD;  Location: [place];  Service: Orthopedics;  Laterality: Left;  . Appendectomy  1962  . Abdominal hysterectomy  1988  . Partial colectomy and colostomy  sept 2013    mucous fistula done  . Partial colectomy  05/17/2012    Procedure: PARTIAL COLECTOMY;  Surgeon: Odis Hollingshead, MD;  Location: [facility];  Service: General;  Laterality: N/A;  . Colostomy closure  05/17/2012    Procedure: COLOSTOMY CLOSURE;  Surgeon: Odis Hollingshead, MD;  Location: [facility];  Service: General;  Laterality: N/A;  . Laparotomy  05/17/2012    Procedure: EXPLORATORY LAPAROTOMY;  Surgeon: Odis Hollingshead, MD;  Location: [facility];  Service: General;;  . Lysis of adhesion  05/17/2012    Procedure: LYSIS OF ADHESION;  Surgeon: Odis Hollingshead, MD;  Location: [facility];  Service: General;;  . Esophageal biopsy  05/17/2012    Procedure: BIOPSY;  Surgeon: Odis Hollingshead, MD;  Location: [facility];  Service: General;;  omental biopsy  . Laparotomy  05/22/2012    Procedure: EXPLORATORY LAPAROTOMY;  Surgeon: Odis Hollingshead, MD;  Location: [facility];  Service: General;  Laterality: N/A;  DRAINAGE  INTRA-ABDOMINAL ABSCESS/LYSIS OF ADHESIONSFOR SMALL BOWEL OBSTRUCTION/DIVERTING LOOP ILEOSTOMY  . Tee without cardioversion N/A 11/25/2013    Procedure: TRANSESOPHAGEAL ECHOCARDIOGRAM (TEE);  Surgeon: Dorothy Spark, MD;  Location: [place];  Service: Cardiovascular;  Laterality: N/A;    Carlis Stable MS, [place], [place] Pager [phone] Weekend/After Hours Pager

## 2013-12-16 NOTE — Consult Note (Signed)
WOC wound consult note   Reason for Consult: Chronic open wound to abdomen. Nonhealing surgical wound.  RLQ Ileostomy  C-Diff positive stool at this time.  Wound type: Chronic, nonhealing surgical wound. History (+) MRSA  Measurement: 10 cm x 12 cm scarred area with nonintact skin measuring 4.5 cm x 6 cm x 0.1 cm.  Bled minimally at 12 o'clock with dressing removal and wound care. Wound bed: 100% pale pink.  Drainage (amount, consistency, odor) Moderate, thick, green drainage. History of pseudomonas, per patient. Musty odor.  Periwound: Scabbing noted to periphery of wound. Otherwise intact. Scarring noted with new epithelial growth present.  Dressing procedure/placement/frequency: Cleanse wound with NS and pat gently dry. Apply Silver hydrofiber dressing  to wound bed. [phone])  Top with dry gauze and secure with tape. Change Mon/Wed/Fri WOC ostomy consult note  Stoma type/location: RLQ Ileostomy  Stomal assessment/size: not assessed this visit. Pouch was changed yesterday x 2.  Peristomal assessment: Patient states skin is intact. Independent with self care.  Treatment options for stomal/peristomal skin: Convex, 1 piece pouching system.  Output Liquid, high output.  Ostomy pouching: 1pc. Pouch 1 1/2 " Kellie Simmering # [phone])  Education provided:  Pouches and Hydrofiber ordered. Patient independent with self care.  Will not follow at this time. Please re-consult if needed.  Domenic Moras RN BSN [place]  Pager [phone]

## 2013-12-17 ENCOUNTER — Ambulatory Visit: Payer: BC Managed Care – PPO

## 2013-12-17 ENCOUNTER — Other Ambulatory Visit: Payer: BC Managed Care – PPO

## 2013-12-17 DIAGNOSIS — A0472 Enterocolitis due to Clostridium difficile, not specified as recurrent: Secondary | ICD-10-CM | POA: Diagnosis present

## 2013-12-17 DIAGNOSIS — Z22322 Carrier or suspected carrier of Methicillin resistant Staphylococcus aureus: Secondary | ICD-10-CM

## 2013-12-17 DIAGNOSIS — R110 Nausea: Secondary | ICD-10-CM

## 2013-12-17 LAB — WOUND CULTURE: Gram Stain: NONE SEEN

## 2013-12-17 MED ORDER — BUPROPION HCL ER (XL) 300 MG PO TB24
300.0000 mg | ORAL_TABLET | Freq: Every day | ORAL | Status: DC
  Administered 2013-12-17 – 2013-12-22 (×6): 300 mg via ORAL
  Filled 2013-12-17 (×7): qty 1

## 2013-12-17 MED ORDER — DILTIAZEM HCL ER COATED BEADS 120 MG PO CP24
120.0000 mg | ORAL_CAPSULE | Freq: Every day | ORAL | Status: DC
  Administered 2013-12-17 – 2013-12-22 (×6): 120 mg via ORAL
  Filled 2013-12-17 (×7): qty 1

## 2013-12-17 MED ORDER — METOPROLOL TARTRATE 25 MG PO TABS
25.0000 mg | ORAL_TABLET | Freq: Two times a day (BID) | ORAL | Status: DC
  Administered 2013-12-17 – 2013-12-22 (×11): 25 mg via ORAL
  Filled 2013-12-17 (×13): qty 1

## 2013-12-17 MED ORDER — PROMETHAZINE HCL 25 MG/ML IJ SOLN
12.5000 mg | Freq: Four times a day (QID) | INTRAMUSCULAR | Status: DC | PRN
  Administered 2013-12-17 – 2013-12-22 (×7): 12.5 mg via INTRAVENOUS
  Filled 2013-12-17 (×8): qty 1

## 2013-12-17 MED ORDER — PSEUDOEPHEDRINE HCL ER 120 MG PO TB12
120.0000 mg | ORAL_TABLET | Freq: Two times a day (BID) | ORAL | Status: DC | PRN
  Administered 2013-12-17 – 2013-12-18 (×2): 120 mg via ORAL
  Filled 2013-12-17 (×2): qty 1

## 2013-12-17 MED ORDER — LORATADINE 10 MG PO TABS
5.0000 mg | ORAL_TABLET | Freq: Two times a day (BID) | ORAL | Status: DC | PRN
  Administered 2013-12-17 – 2013-12-18 (×2): 5 mg via ORAL
  Filled 2013-12-17 (×2): qty 0.5

## 2013-12-17 MED ORDER — LORATADINE-PSEUDOEPHEDRINE ER 5-120 MG PO TB12: 1.0000 | ORAL_TABLET | Freq: Every day | ORAL | Status: DC | PRN

## 2013-12-17 NOTE — Progress Notes (Signed)
PULMONARY  / CRITICAL CARE MEDICINE CONSULTATION  Name: [name] MRN: [id number] DOB: [birth year]    ADMISSION DATE:  12/14/2013 CONSULTATION DATE: 12/14/2013  REQUESTING CLINICIAN: Dr. 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 +  cdiff.  SIGNIFICANT EVENTS / STUDIES:   LINES / TUBES: L IJ CVL 8/29 >>>  CULTURES: Urine 8/4 >>> MRSA Urine 8/29 >>>e coli/staph>> Blood 8/29 >>>  Abdominal wound 8/29 >>> c  Diff 8/30>>++ ANTIBIOTICS: Per ID as of 8/30  Doxycycline 8/29 >>> 8/30 Aztreonam 8/29 >>>8/30 Zyvox 8/30 >>>8/31 Dificid 8/30>> Flagyl 8/30>>    INTERVAL HISTORY:  Feels better, stable  VITAL SIGNS: Temp:  [98.6 F (37 C)-99.3 F (37.4 C)] 98.7 F (37.1 C) (08/31 2357) Pulse Rate:  [25-132] 29 (09/01 0800) Resp:  [12-31] 13 (09/01 0800) BP: (108-136)/(80-91) 125/86 mmHg (09/01 0800) SpO2:  [92 %-100 %] 100 % (09/01 0800) Weight:  [149 lb 11.1 oz (67.9 kg)] 149 lb 11.1 oz (67.9 kg) (09/01 0538)  HEMODYNAMICS:    VENTILATOR SETTINGS:   INTAKE / OUTPUT: Intake/Output     08/31 0701 - 09/01 0700 09/01 0701 - 09/02 0700   P.O. 920 240   I.V. (mL/kg) 2118.7 (31.2) 255 (3.8)   IV Piggyback 600 100   Total Intake(mL/kg) 3638.7 (53.6) 595 (8.8)   Urine (mL/kg/hr) 1985 (1.2) 500 (2.4)   Stool 2025 (1.2) 125 (0.6)   Total Output 4010 625   Net -371.3 -30         PHYSICAL EXAMINATION: General:  Comfortable, reports feeling better, no further vomiting. Neuro:  Awake, alert, adequate pain control HEENT: Dry membranes Neck: No JVD Cardiovascular: Regular, no murmurs, converted from Afib to SR Abdomen:  Soft, dry wound dressing, ileostomy with liquid drainage Musculoskeletal:  No edema Skin:  No rash  LABS:  CBC  Recent Labs Lab 12/14/13 0215 12/15/13 0505 12/16/13 0545  WBC  16.5* 25.5* 9.5  HGB 7.4* 7.0* 7.4*  HCT 22.3* 19.7* 21.3*  PLT 417* 230 224   Coag's  Recent Labs Lab 12/14/13 0215  APTT 34  INR 1.32   BMET  Recent Labs Lab 12/14/13 0215 12/15/13 0505 12/16/13 0545  NA 130* 129* 136*  K 5.3 4.1 3.9  CL 101 99 101  CO2 13* 18* 22  BUN 20 20 13   CREATININE 1.38* 1.06 0.73  GLUCOSE 99 124* 107*   Electrolytes  Recent Labs Lab 12/14/13 0208 12/14/13 0215 12/15/13 0505 12/16/13 0545  CALCIUM  --  4.8* 4.6* 4.5*  MG 0.5*  --   --  1.2*  PHOS 3.7  --   --  2.1*   Sepsis Markers  Recent Labs Lab 12/14/13 0215  LATICACIDVEN 1.8   ABG No results found for this basename: PHART, PCO2ART, PO2ART,  in the last 168 hours Liver Enzymes  Recent Labs Lab 12/14/13 0215  AST 17  ALT 10  ALKPHOS 306*  BILITOT 0.3  ALBUMIN 2.0*   Cardiac Enzymes No results found for this basename: TROPONINI, PROBNP,  in the last 168 hours  Glucose No results found for this basename: GLUCAP,  in the last 168 hours  IMAGING:   No results found. ASSESSMENT / PLAN:  PULMONARY A: No active issues P:   Goal SpO2>92 Supplemental oxygen PRN  CARDIOVASCULAR A: Possible SIRS / sepsis vs hypovolemic hypotension Lactate is  reassuring AF with RVR, converted to SR 8/30 P:   Goal MAP > 65, HR<120 Neo-Synephrine PRN Metoprolol PRN Adequate hydration with high output ostomy ++ cdiff  RENAL  A: AKI, resolved Hypovolemia Hypocalcemia ( correctedCa 0.61 on 8/31 ) Hypomagnesemia (0/56 1.2) Metabolic acidosis secondary to bicarbonate loss, improving co2 22 P:   Goal CVP 8-10 Bicarbonate gtt to 75 mL/h On mg/ca replacement  NS 500   PRN for goal CVP  GASTROINTESTINAL A: S/p ileostomy with high autput  Ileus GERD P:   Liquid diet Protonix Florastor Sucralfate  HEMATOLOGIC A: Anemia CML VTE Px P:   Goal Hb>=7 Lovenox  INFECTIOUS A: Recent MRSA UTI Draining abdominal wound with h/o Pseudomonas infection PCN /  vancomycin allergy Increasing WBC in spite of abx Dx with Cdiff 8/30 ID consult 8/30 P:   D/c Doxycycline 8/29 >>> 8/30 Aztreonam 8/29>>8/30 Add Zyvox 8/30>>8/31 Add dificid 8/30(ID) Flagyl added 8/30>> Follow blood cx / wound cx / urine cx +++ C.dif PCR ID now in control, not treating MRSA but tx for c diff priority.  ENDOCRINE A: Adrenal insufficiency rule out ( cortisol 70 on 8/29 ) P:   No intervention required  NEUROLOGIC A: Pain Depression P: Oxycodone / Fentanyl PRN Hold Wellbutrin / Flexeryl while on Zyvox ( at risk for serotonin syndrome )     tx out of ICU  And PCCM sign off.  Please call if needed.   Richardson Landry Omega Durante ACNP Maryanna Shape PCCM Pager [phone] till 3 pm If no answer page [phone] 12/17/2013, 10:02 AM

## 2013-12-17 NOTE — Progress Notes (Signed)
Patient ID: [name], female   DOB: [birth year], 63 y.o.   MRN: [id number]         [facility]    Date of Admission:  12/14/2013           Day 3 IV metronidazole        Day 3 oral fiaxomicin Principal Problem:   C. difficile enteritis Active Problems:   Atrial fibrillation with rapid ventricular response   Hypotension   Chronic hyponatremia   CMML (chronic myelomonocytic leukemia)   Open abdominal wall wound   Hypocalcemia   Metabolic acidosis   MRSA carrier   COPD (chronic obstructive pulmonary disease)   Anemia of chronic disease   Leukocytosis, unspecified   Shock   Hypovolemia dehydration   Nausea   . sodium chloride   Intravenous Once  . sodium chloride   Intravenous Once  . antiseptic oral rinse  7 mL Mouth Rinse BID  . aspirin  325 mg Oral q morning - 10a  . calcium carbonate  1 tablet Oral TID  . digoxin  0.125 mg Intravenous Daily  . diltiazem  120 mg Oral Daily  . enoxaparin (LOVENOX) injection  40 mg Subcutaneous Q24H  . estradiol  2 mg Oral Daily  . famotidine  40 mg Oral Daily  . fidaxomicin  200 mg Oral BID  . magnesium oxide  400 mg Oral BID  . metoprolol tartrate  25 mg Oral BID  . metronidazole  500 mg Intravenous Q8H  . multivitamin with minerals  1 tablet Oral Daily  . saccharomyces boulardii  250 mg Oral BID  . sodium chloride  1,000 mL Intravenous Once  . sucralfate  1 g Oral TID WC & HS    Subjective: She is feeling a little bit better but still having some nausea and abdominal cramps. She has not noted any change in her ostomy output. She has a very mild nonproductive cough that she feels is due to her seasonal allergies. She has no shortness of breath. It is not having any dysuria.  Objective: Temp:  [98.2 F (36.8 C)-99.3 F (37.4 C)] 98.8 F (37.1 C) (09/01 1316) Pulse Rate:  [35-132] 86 (09/01 1316) Resp:  [12-25] 18 (09/01 1316) BP: (108-136)/(80-96) 136/96 mmHg (09/01 1316) SpO2:  [92 %-100 %] 99 %  (09/01 1316) Weight:  [149 lb 11.1 oz (67.9 kg)-151 lb 0.2 oz (68.5 kg)] 151 lb 0.2 oz (68.5 kg) (09/01 1316)  General: She is smiling and in good spirits Skin: No rash Lungs: Clear Cor: Regular S1 and S2 no murmurs Abdomen: Soft and nontender with quiet bowel sounds. She has a clean dry dressing over her mid abdominal, chronic wound. She has a right-sided ostomy   Lab Results Lab Results  Component Value Date   WBC 9.5 12/16/2013   HGB 7.4* 12/16/2013   HCT 21.3* 12/16/2013   MCV 92.2 12/16/2013   PLT 224 12/16/2013    Lab Results  Component Value Date   CREATININE 0.73 12/16/2013   BUN 13 12/16/2013   NA 136* 12/16/2013   K 3.9 12/16/2013   CL 101 12/16/2013   CO2 22 12/16/2013    Lab Results  Component Value Date   ALT 10 12/14/2013   AST 17 12/14/2013   ALKPHOS 306* 12/14/2013   BILITOT 0.3 12/14/2013      Microbiology: Recent Results (from the past 240 hour(s))  MRSA PCR SCREENING     Status: Abnormal   Collection Time  12/14/13  5:30 AM      Result Value Ref Range Status   MRSA by PCR POSITIVE (*) NEGATIVE Final   Comment:            The GeneXpert MRSA Assay (FDA     approved for NASAL specimens     only), is one component of a     comprehensive MRSA colonization     surveillance program. It is not     intended to diagnose MRSA     infection nor to guide or     monitor treatment for     MRSA infections.     RESULT CALLED TO, READ BACK BY AND VERIFIED WITH:     Wallene Huh [id number] @ [phone] BY J SCOTTON  CULTURE, BLOOD (ROUTINE X 2)     Status: None   Collection Time    12/14/13  7:55 AM      Result Value Ref Range Status   Specimen Description BLOOD RIGHT HAND   Final   Special Requests BOTTLES DRAWN AEROBIC ONLY 5 CC   Final   Culture  Setup Time     Final   Value: 12/14/2013 15:22     Performed at [facility]   Culture     Final   Value:        BLOOD CULTURE RECEIVED NO GROWTH TO DATE CULTURE WILL BE HELD FOR 5 DAYS BEFORE ISSUING A FINAL NEGATIVE  REPORT     Performed at [facility]   Report Status PENDING   Incomplete  CULTURE, BLOOD (ROUTINE X 2)     Status: None   Collection Time    12/14/13 10:55 AM      Result Value Ref Range Status   Specimen Description BLOOD LEFT PORTA CATH   Final   Special Requests     Final   Value: BOTTLES DRAWN AEROBIC AND ANAEROBIC 10CC IMMUNOCOMPROMISED   Culture  Setup Time     Final   Value: 12/14/2013 18:15     Performed at [facility]   Culture     Final   Value:        BLOOD CULTURE RECEIVED NO GROWTH TO DATE CULTURE WILL BE HELD FOR 5 DAYS BEFORE ISSUING A FINAL NEGATIVE REPORT     Performed at [facility]   Report Status PENDING   Incomplete  WOUND CULTURE     Status: None   Collection Time    12/14/13 11:15 AM      Result Value Ref Range Status   Specimen Description WOUND ABDOMINAL WOUND   Final   Special Requests NONE   Final   Gram Stain     Final   Value: NO WBC SEEN     NO SQUAMOUS EPITHELIAL CELLS SEEN     MODERATE GRAM POSITIVE COCCI     IN PAIRS IN CLUSTERS IN CHAINS     Performed at [facility]   Culture     Final   Value: MULTIPLE ORGANISMS PRESENT, NONE PREDOMINANT     Note: NO STAPHYLOCOCCUS AUREUS ISOLATED NO GROUP A STREP (S.PYOGENES) ISOLATED     Performed at [facility]   Report Status 12/17/2013 FINAL   Final  URINE CULTURE     Status: None   Collection Time    12/14/13  1:21 PM      Result Value Ref Range Status   Specimen Description URINE, CATHETERIZED   Final   Special Requests  NONE   Final   Culture  Setup Time     Final   Value: 12/14/2013 19:07     Performed at [facility]     Final   Value: >=100,000 COLONIES/ML     Performed at [facility]   Culture     Final   Value: ENTEROCOCCUS SPECIES     STAPHYLOCOCCUS AUREUS     Note: RIFAMPIN AND GENTAMICIN SHOULD NOT BE USED AS SINGLE DRUGS FOR TREATMENT OF STAPH INFECTIONS.     Performed at [facility]   Report  Status PENDING   Incomplete  CLOSTRIDIUM DIFFICILE BY PCR     Status: Abnormal   Collection Time    12/15/13  9:24 AM      Result Value Ref Range Status   C difficile by pcr POSITIVE (*) NEGATIVE Final   Comment: CRITICAL RESULT CALLED TO, READ BACK BY AND VERIFIED WITH:     A.ARNOLD,RN [id number] 12/15/13 M.Annaka Cleaver     Performed at [facility]    Assessment: She's had some improvement on therapy for C. difficile enteritis. She is still having some signs of ileus with persistent nausea so I will continue IV metronidazole and oral fidaxomicin. Once her nausea has resolved we can stop IV metronidazole and complete 2 weeks of fidaxomicin.  She's completed 26 days of linezolid for MRSA UTI and possible pneumonia. She has no UTI symptoms currently and her chest x-ray is improved. I agree with discontinuing linezolid.  Plan: 1. Continue IV metronidazole and oral fidaxomicin for now  Michel Bickers, MD [facility] [place] [phone] pager   [phone] cell 12/17/2013, 4:33 PM

## 2013-12-17 NOTE — Progress Notes (Signed)
[facility] PROGRESS NOTE  [name] CZY:[id number] DOB: January 08, 1951 DOA: 12/14/2013 PCP: Darlin Coco, MD  Assessment/Plan: 1. Hypovolemia dehydration/Shock/ Hypotension- due to High Output Ostomy and c. Diff infection -continue IVFs for Fluid Resuscitation  -central line in place and will target 8-10 on CVP -with follow clinical response; if remains stable will transfer out of the unit on 9/1 -ID on board will follow recommendations for antibiotic adjustment in length of therapy.   2. Atrial fibrillation with rapid ventricular response  -Due to #1  -continue IVFs, metoprolol, cardizem and digoxin -heart rate in the 90's; who has now converted to sinus rhythm.  3. Hypocalcemia and hypomagnesemia: due to loss from [employer] and poor nutrition -continue calcium and magnesium repletion -continue tums -follow electrolytes  4. 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 On 8/31 sodium 136  5. CMML (chronic myelomonocytic leukemia)  -stable  -continue follow up with oncology service as an outpatient -Hgb stable (7.4); hemodilution playing role  6. Open abdominal wall wound  -Will follow Wound care team recommendations   -no drainage or superimposed signs of infection  7. Metabolic acidosis- Due to High Output Ostomy, and Resolving Infection   -most likely triggered by c. Diff infection and diarrhea -will treat c. Diff -continue bicarb drip but will adjust rate to 75 cc per hour;  -BMET in am  8. COPD (chronic obstructive pulmonary disease)  -Monitor O2 Sats and Albuterol Nebs PRN  -prn oxygen supplementation  9. Anemia of chronic disease -  -follow anemia panel  -status post 1 units of PRBC's; Hgb 7.4 (stable and no requiring further transfusion at this moment) -will hold further transfusion today; patient w/o overt bleeding and has received  approx 7L of fluids (hemodilution playing role) -will follow CBC in am  10. C. Diff enteritis: will treat with dificid and IV flagyl -severe infection base on algorithm -Will add florastor -avoiding protonix due to C. Diff infection -patient allergic to vanc -ID consulted will follow recommendations  11. MRSA/enterococcus infection in urine: appears to be colonization -patient has received 26 days of zyvox  -per ID treat just C. Diff and discontinue the rest of other antibiotics  DVT: lovenox  Code Status: Full Family Communication: daughter at bedside Disposition Plan: to be determine. remains in stepdown   Consultants:  PCCM  ID  Procedures:  See below for x-ray reports   Antibiotics: Doxycycline 8/29--8/30 Aztreonam 8/29 zyvox 8/30 IV Flagyl 8/30 Dificid 8/30  HPI/Subjective: Feeling better; no fever. denies dysuria. Patient urine cx positive for MRSA and enterococcus; high output through ileostomy due to C. Diff still present.  Objective: Filed Vitals:   12/17/13 1316  BP: 136/96  Pulse: 86  Temp: 98.8 F (37.1 C)  Resp: 18    Intake/Output Summary (Last 24 hours) at 12/17/13 1346 Last data filed at 12/17/13 1200  Gross per 24 hour  Intake   2915 ml  Output   3910 ml  Net   -995 ml   Filed Weights   12/16/13 0500 12/17/13 0538 12/17/13 1316  Weight: 65.862 kg (145 lb 3.2 oz) 67.9 kg (149 lb 11.1 oz) 68.5 kg (151 lb 0.2 oz)    Exam:   General:  Feeling better; still with some abd discomfort and high ostomy output. Denies CP, vomiting, melena and hematochezia.   Cardiovascular:HR in the high 90's now, but sinus rhythm; no rubs or gallops  Respiratory: no  wheezing, no crackles  Abdomen: soft, No distended, ileostomy in place (plenty of output); positive BS; no guarding. abd wound w/o superimposed signs of infections or active drainage  Musculoskeletal: no swelling or erythema  Data Reviewed: Basic Metabolic Panel:  Recent Labs Lab  12/14/13 0208 12/14/13 0215 12/15/13 0505 12/16/13 0545  NA  --  130* 129* 136*  K  --  5.3 4.1 3.9  CL  --  101 99 101  CO2  --  13* 18* 22  GLUCOSE  --  99 124* 107*  BUN  --  20 20 13   CREATININE  --  1.38* 1.06 0.73  CALCIUM  --  4.8* 4.6* 4.5*  MG 0.5*  --   --  1.2*  PHOS 3.7  --   --  2.1*   Liver Function Tests:  Recent Labs Lab 12/14/13 0215  AST 17  ALT 10  ALKPHOS 306*  BILITOT 0.3  PROT 5.7*  ALBUMIN 2.0*   CBC:  Recent Labs Lab 12/14/13 0215 12/15/13 0505 12/16/13 0545  WBC 16.5* 25.5* 9.5  NEUTROABS 10.4*  --   --   HGB 7.4* 7.0* 7.4*  HCT 22.3* 19.7* 21.3*  MCV 93.3 90.4 92.2  PLT 417* 230 224   BNP (last 3 results)  Recent Labs  03/24/13 2031 03/25/13 0334 03/26/13 0414  PROBNP 9254.0* 9403.0* 4535.0*    Recent Results (from the past 240 hour(s))  MRSA PCR SCREENING     Status: Abnormal   Collection Time    12/14/13  5:30 AM      Result Value Ref Range Status   MRSA by PCR POSITIVE (*) NEGATIVE Final   Comment:            The GeneXpert MRSA Assay (FDA     approved for NASAL specimens     only), is one component of a     comprehensive MRSA colonization     surveillance program. It is not     intended to diagnose MRSA     infection nor to guide or     monitor treatment for     MRSA infections.     RESULT CALLED TO, READ BACK BY AND VERIFIED WITH:     Wallene Huh [id number] @ [phone] BY J SCOTTON  CULTURE, BLOOD (ROUTINE X 2)     Status: None   Collection Time    12/14/13  7:55 AM      Result Value Ref Range Status   Specimen Description BLOOD RIGHT HAND   Final   Special Requests BOTTLES DRAWN AEROBIC ONLY 5 CC   Final   Culture  Setup Time     Final   Value: 12/14/2013 15:22     Performed at [facility]   Culture     Final   Value:        BLOOD CULTURE RECEIVED NO GROWTH TO DATE CULTURE WILL BE HELD FOR 5 DAYS BEFORE ISSUING A FINAL NEGATIVE REPORT     Performed at [facility]   Report Status PENDING    Incomplete  CULTURE, BLOOD (ROUTINE X 2)     Status: None   Collection Time    12/14/13 10:55 AM      Result Value Ref Range Status   Specimen Description BLOOD LEFT PORTA CATH   Final   Special Requests     Final   Value: BOTTLES DRAWN AEROBIC AND ANAEROBIC 10CC IMMUNOCOMPROMISED   Culture  Setup Time  Final   Value: 12/14/2013 18:15     Performed at [facility]   Culture     Final   Value:        BLOOD CULTURE RECEIVED NO GROWTH TO DATE CULTURE WILL BE HELD FOR 5 DAYS BEFORE ISSUING A FINAL NEGATIVE REPORT     Performed at [facility]   Report Status PENDING   Incomplete  WOUND CULTURE     Status: None   Collection Time    12/14/13 11:15 AM      Result Value Ref Range Status   Specimen Description WOUND ABDOMINAL WOUND   Final   Special Requests NONE   Final   Gram Stain     Final   Value: NO WBC SEEN     NO SQUAMOUS EPITHELIAL CELLS SEEN     MODERATE GRAM POSITIVE COCCI     IN PAIRS IN CLUSTERS IN CHAINS     Performed at [facility]   Culture     Final   Value: MULTIPLE ORGANISMS PRESENT, NONE PREDOMINANT     Note: NO STAPHYLOCOCCUS AUREUS ISOLATED NO GROUP A STREP (S.PYOGENES) ISOLATED     Performed at [facility]   Report Status 12/17/2013 FINAL   Final  URINE CULTURE     Status: None   Collection Time    12/14/13  1:21 PM      Result Value Ref Range Status   Specimen Description URINE, CATHETERIZED   Final   Special Requests NONE   Final   Culture  Setup Time     Final   Value: 12/14/2013 19:07     Performed at [facility]     Final   Value: >=100,000 COLONIES/ML     Performed at [facility]   Culture     Final   Value: ENTEROCOCCUS SPECIES     STAPHYLOCOCCUS AUREUS     Note: RIFAMPIN AND GENTAMICIN SHOULD NOT BE USED AS SINGLE DRUGS FOR TREATMENT OF STAPH INFECTIONS.     Performed at [facility]   Report Status PENDING   Incomplete  CLOSTRIDIUM DIFFICILE BY PCR     Status:  Abnormal   Collection Time    12/15/13  9:24 AM      Result Value Ref Range Status   C difficile by pcr POSITIVE (*) NEGATIVE Final   Comment: CRITICAL RESULT CALLED TO, READ BACK BY AND VERIFIED WITH:     A.ARNOLD,RN 0355 12/15/13 M.CAMPBELL     Performed at [facility]     Studies: No results found.  Scheduled Meds: . sodium chloride   Intravenous Once  . sodium chloride   Intravenous Once  . antiseptic oral rinse  7 mL Mouth Rinse BID  . aspirin  325 mg Oral q morning - 10a  . calcium carbonate  1 tablet Oral TID  . digoxin  0.125 mg Intravenous Daily  . diltiazem  120 mg Oral Daily  . enoxaparin (LOVENOX) injection  40 mg Subcutaneous Q24H  . estradiol  2 mg Oral Daily  . famotidine  40 mg Oral Daily  . fidaxomicin  200 mg Oral BID  . magnesium oxide  400 mg Oral BID  . metoprolol tartrate  25 mg Oral BID  . metronidazole  500 mg Intravenous Q8H  . multivitamin with minerals  1 tablet Oral Daily  . saccharomyces boulardii  250 mg Oral BID  . sodium chloride  1,000 mL  Intravenous Once  . sucralfate  1 g Oral TID WC & HS   Continuous Infusions: .  sodium bicarbonate  infusion 1000 mL 75 mL/hr at 12/17/13 1113    Principal Problem:   Hypovolemia dehydration Active Problems:   Atrial fibrillation with rapid ventricular response   Hypotension   Chronic hyponatremia   CMML (chronic myelomonocytic leukemia)   Open abdominal wall wound   Hypocalcemia   Metabolic acidosis   MRSA carrier   COPD (chronic obstructive pulmonary disease)   Anemia of chronic disease   Leukocytosis, unspecified   Shock   Nausea    Time spent: >30 minutes    Barton Dubois  [facility] Pager [phone]. If 7PM-7AM, please contact night-coverage at [email or web address], password [facility] 12/17/2013, 1:46 PM  LOS: 3 days

## 2013-12-17 NOTE — Progress Notes (Signed)
No change from am assessment. Pt alert and oriented no c/o

## 2013-12-17 NOTE — Progress Notes (Signed)
Patient seen and examined with NP S. Minor.  Lab, images, and vitals reviewed. Agree with NP S. Minor assessment and plan with the following exceptions:  1. C. Diff infection - con't with dificid and flagyl, ID following - monitor all drain output, she has the potential to become dehydrated again.   Stable to transfer out of ICU.  PCCM signoff.   Thank you for consulting [facility].  Critical Care Time = 35  Vilinda Boehringer, MD [facility] Pager [phone] On Call Pager [phone]

## 2013-12-18 DIAGNOSIS — J984 Other disorders of lung: Secondary | ICD-10-CM

## 2013-12-18 DIAGNOSIS — D649 Anemia, unspecified: Secondary | ICD-10-CM

## 2013-12-18 DIAGNOSIS — J189 Pneumonia, unspecified organism: Secondary | ICD-10-CM

## 2013-12-18 LAB — URINE CULTURE

## 2013-12-18 MED ORDER — K PHOS MONO-SOD PHOS DI & MONO 155-852-130 MG PO TABS
500.0000 mg | ORAL_TABLET | Freq: Three times a day (TID) | ORAL | Status: DC
Start: 2013-12-18 — End: 2013-12-19
  Administered 2013-12-18 – 2013-12-19 (×3): 500 mg via ORAL
  Filled 2013-12-18 (×7): qty 2

## 2013-12-18 MED ORDER — SODIUM CHLORIDE 0.9 % IV SOLN
1.0000 mg/kg/h | INTRAVENOUS | Status: DC
  Administered 2013-12-18 – 2013-12-19 (×3): 1 mg/kg/h via INTRAVENOUS
  Filled 2013-12-18 (×8): qty 100

## 2013-12-18 MED ORDER — SODIUM CHLORIDE 0.9 % IJ SOLN
10.0000 mL | INTRAMUSCULAR | Status: DC | PRN
  Administered 2013-12-18: 30 mL
  Administered 2013-12-19: 40 mL
  Administered 2013-12-19 – 2013-12-21 (×5): 10 mL
  Administered 2013-12-22: 20 mL

## 2013-12-18 MED ORDER — MAGNESIUM SULFATE 40 MG/ML IJ SOLN
2.0000 g | Freq: Once | INTRAMUSCULAR | Status: AC
  Administered 2013-12-18: 2 g via INTRAVENOUS
  Filled 2013-12-18: qty 50

## 2013-12-18 NOTE — Progress Notes (Signed)
CRITICAL VALUE ALERT  Critical value received: Urine culture positive; 100,000 colonies of growth, enterococcus species, +MRSA.  Date of notification:  12/18/2013  Time of notification:  0146  Critical value read back:Yes.    Nurse who received alert:  Carolyn Stare, RN  MD notified (1st page):  Lamar Blinks, NP   Time of first page:  0159  MD notified (2nd page):  Time of second page:  Responding MD:  Awaiting call back.  Time MD responded: Awaiting call back

## 2013-12-18 NOTE — Progress Notes (Signed)
Patient ID: [name], female   DOB: 18-Jan-1951, 63 y.o.   MRN: [id number]         [facility]    Date of Admission:  12/14/2013   Total days of Clostridium difficile antibiotics 4        Principal Problem:   C. difficile enteritis Active Problems:   Atrial fibrillation with rapid ventricular response   Hypotension   Chronic hyponatremia   CMML (chronic myelomonocytic leukemia)   Open abdominal wall wound   Hypocalcemia   Metabolic acidosis   MRSA carrier   COPD (chronic obstructive pulmonary disease)   Anemia of chronic disease   Leukocytosis, unspecified   Shock   Hypovolemia dehydration   Nausea   . sodium chloride   Intravenous Once  . sodium chloride   Intravenous Once  . antiseptic oral rinse  7 mL Mouth Rinse BID  . aspirin  325 mg Oral q morning - 10a  . buPROPion  300 mg Oral Daily  . digoxin  0.125 mg Intravenous Daily  . diltiazem  120 mg Oral Daily  . enoxaparin (LOVENOX) injection  40 mg Subcutaneous Q24H  . estradiol  2 mg Oral Daily  . famotidine  40 mg Oral Daily  . fidaxomicin  200 mg Oral BID  . magnesium oxide  400 mg Oral BID  . magnesium sulfate 1 - 4 g bolus IVPB  2 g Intravenous Once  . metoprolol tartrate  25 mg Oral BID  . metronidazole  500 mg Intravenous Q8H  . multivitamin with minerals  1 tablet Oral Daily  . phosphorus  500 mg Oral TID  . saccharomyces boulardii  250 mg Oral BID  . sodium chloride  1,000 mL Intravenous Once  . sucralfate  1 g Oral TID WC & HS    Subjective: She is still bothered by nausea but has not had any further vomiting. She is also still having abdominal cramps. She has not had any dysuria. She was able to eat some cheese and fruit for breakfast.  Objective: Temp:  [98.1 F (36.7 C)-98.8 F (37.1 C)] 98.1 F (36.7 C) (09/02 0513) Pulse Rate:  [69-122] 91 (09/02 0626) Resp:  [18] 18 (09/02 0513) BP: (102-136)/(65-107) 102/65 mmHg (09/02 0513) SpO2:  [91 %-99 %] 93 % (09/02  0626) Weight:  [151 lb 0.2 oz (68.5 kg)] 151 lb 0.2 oz (68.5 kg) (09/01 1316)  General: Alert and comfortable watching television Abdomen: Soft and nontender with quiet bowel sounds  Lab Results Lab Results  Component Value Date   WBC 9.5 12/16/2013   HGB 7.4* 12/16/2013   HCT 21.3* 12/16/2013   MCV 92.2 12/16/2013   PLT 224 12/16/2013    Lab Results  Component Value Date   CREATININE 0.73 12/16/2013   BUN 13 12/16/2013   NA 136* 12/16/2013   K 3.9 12/16/2013   CL 101 12/16/2013   CO2 22 12/16/2013     Microbiology: Recent Results (from the past 240 hour(s))  MRSA PCR SCREENING     Status: Abnormal   Collection Time    12/14/13  5:30 AM      Result Value Ref Range Status   MRSA by PCR POSITIVE (*) NEGATIVE Final   Comment:            The GeneXpert MRSA Assay (FDA     approved for NASAL specimens     only), is one component of a     comprehensive MRSA colonization  surveillance program. It is not     intended to diagnose MRSA     infection nor to guide or     monitor treatment for     MRSA infections.     RESULT CALLED TO, READ BACK BY AND VERIFIED WITH:     Wallene Huh [id number] @ [phone] BY J SCOTTON  CULTURE, BLOOD (ROUTINE X 2)     Status: None   Collection Time    12/14/13  7:55 AM      Result Value Ref Range Status   Specimen Description BLOOD RIGHT HAND   Final   Special Requests BOTTLES DRAWN AEROBIC ONLY 5 CC   Final   Culture  Setup Time     Final   Value: 12/14/2013 15:22     Performed at [facility]   Culture     Final   Value:        BLOOD CULTURE RECEIVED NO GROWTH TO DATE CULTURE WILL BE HELD FOR 5 DAYS BEFORE ISSUING A FINAL NEGATIVE REPORT     Performed at [facility]   Report Status PENDING   Incomplete  CULTURE, BLOOD (ROUTINE X 2)     Status: None   Collection Time    12/14/13 10:55 AM      Result Value Ref Range Status   Specimen Description BLOOD LEFT PORTA CATH   Final   Special Requests     Final   Value: BOTTLES DRAWN  AEROBIC AND ANAEROBIC 10CC IMMUNOCOMPROMISED   Culture  Setup Time     Final   Value: 12/14/2013 18:15     Performed at [facility]   Culture     Final   Value:        BLOOD CULTURE RECEIVED NO GROWTH TO DATE CULTURE WILL BE HELD FOR 5 DAYS BEFORE ISSUING A FINAL NEGATIVE REPORT     Performed at [facility]   Report Status PENDING   Incomplete  WOUND CULTURE     Status: None   Collection Time    12/14/13 11:15 AM      Result Value Ref Range Status   Specimen Description WOUND ABDOMINAL WOUND   Final   Special Requests NONE   Final   Gram Stain     Final   Value: NO WBC SEEN     NO SQUAMOUS EPITHELIAL CELLS SEEN     MODERATE GRAM POSITIVE COCCI     IN PAIRS IN CLUSTERS IN CHAINS     Performed at [facility]   Culture     Final   Value: MULTIPLE ORGANISMS PRESENT, NONE PREDOMINANT     Note: NO STAPHYLOCOCCUS AUREUS ISOLATED NO GROUP A STREP (S.PYOGENES) ISOLATED     Performed at [facility]   Report Status 12/17/2013 FINAL   Final  URINE CULTURE     Status: None   Collection Time    12/14/13  1:21 PM      Result Value Ref Range Status   Specimen Description URINE, CATHETERIZED   Final   Special Requests NONE   Final   Culture  Setup Time     Final   Value: 12/14/2013 19:07     Performed at [facility]     Final   Value: >=100,000 COLONIES/ML     Performed at [facility]   Culture     Final   Value: ENTEROCOCCUS SPECIES     METHICILLIN RESISTANT  STAPHYLOCOCCUS AUREUS     Note: RIFAMPIN AND GENTAMICIN SHOULD NOT BE USED AS SINGLE DRUGS FOR TREATMENT OF STAPH INFECTIONS. CRITICAL RESULT CALLED TO, READ BACK BY AND VERIFIED WITH: DAWN BROWN 12/18/13 AT 0150 RIDK     Performed at [facility]   Report Status 12/18/2013 FINAL   Final   Organism ID, Bacteria ENTEROCOCCUS SPECIES   Final   Organism ID, Bacteria METHICILLIN RESISTANT STAPHYLOCOCCUS AUREUS   Final  CLOSTRIDIUM DIFFICILE BY PCR     Status:  Abnormal   Collection Time    12/15/13  9:24 AM      Result Value Ref Range Status   C difficile by pcr POSITIVE (*) NEGATIVE Final   Comment: CRITICAL RESULT CALLED TO, READ BACK BY AND VERIFIED WITH:     A.ARNOLD,RN [id number] 12/15/13 M.Bettie Capistran     Performed at [facility]    Assessment: She is improving very slowly on therapy for C. difficile enteritis. Her nausea suggests some persistent ileus a lot we'll continue her current 2 drug regimen for now. She does not have any evidence of asymptomatic urinary tract infection and I would not start her back on therapy for enterococcus or MRSA. She also grew multiple organisms from a swab of her chronic abdominal wound but she does not have any clinical evidence of active wound infection.  Plan: 1. Continue IV metronidazole and oral fidaxomicin  Michel Bickers, MD [facility] for Infectious [place] [phone] pager   [phone] cell 12/18/2013, 9:37 AM

## 2013-12-18 NOTE — Care Management Note (Addendum)
    Page 1 of 1   12/20/2013     1:57:36 PM CARE MANAGEMENT NOTE 12/20/2013  Patient:  [name], [name]   Account Number:  [id number]  Date Initiated:  12/18/2013  Documentation initiated by:  Dessa Phi  Subjective/Objective Assessment:   62 Y/O F ADMITTED W/C DIFF,UTI,SEPSIS.OE:HOZYYQMGN.READMIT-8/3-8/12/15.     Action/Plan:   FROM HOME.   Anticipated DC Date:  12/21/2013   Anticipated DC Plan:  [place]  CM consult      Choice offered to / List presented to:  C-1 Patient           Status of service:  In process, will continue to follow Medicare Important Message given?   (If response is "NO", the following Medicare IM given date fields will be blank) Date Medicare IM given:   Medicare IM given by:   Date Additional Medicare IM given:   Additional Medicare IM given by:    Discharge Disposition:    Per UR Regulation:  Reviewed for med. necessity/level of care/duration of stay  If discussed at [place] of Stay Meetings, dates discussed:   12/19/2013    Comments:  12/20/13 Dorian Renfro RN,BSN NCM [phone] NOTED OT-HH.PT TO EVAL.[facility] CHOSEN IF HHC NEEDED,SPOKE TO KATIE REP [place] OF REFERRAL.  12/19/13 Gilda Abboud RN,BSN NCM [phone] D/C PLAN IN AM HOME.NO ANTICIPATED D/ C NEEDS.  12/18/13 Deajah Erkkila RN,BSN NCM [phone] UTI,C DIFF,HYPOVOLEMIA,HIGH ILEOSTOMY OUTPUT.IV ABX,IV MG/IV CALCIUM.D/C PLAN HOME.

## 2013-12-18 NOTE — Progress Notes (Signed)
[facility] PROGRESS NOTE  [name] LZJ:[id number] DOB: 1950/09/09 DOA: 12/14/2013 PCP: Darlin Coco, MD  HPI/Subjective: Still complaining about nausea, did not completely meet her meals.  Assessment/Plan:  Hypovolemia dehydration/Shock/ Hypotension- due to High Output Ostomy and c. Diff infection -continue IVFs for Fluid Resuscitation  -central line in place and will target 8-10 on CVP -with follow clinical response; if remains stable will transfer out of the unit on 9/1 -ID on board will follow recommendations for antibiotic adjustment in length of therapy.  C. Diff enteritis: will treat with dificid and IV flagyl -severe infection base on algorithm -Florastor added -avoiding protonix due to C. Diff infection -patient allergic to vanc -ID consulted will follow recommendations  MRSA/enterococcus infection in urine: appears to be colonization -patient has received 26 days of zyvox.  -per ID treat just C. Diff and discontinue the rest of other antibiotics  Atrial fibrillation with rapid ventricular response  -Due to #1  -continue IVFs, metoprolol, cardizem and digoxin -heart rate in the 90's; who has now converted to sinus rhythm.  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 On 8/31 sodium 136  CMML (chronic myelomonocytic leukemia)  -stable  -continue follow up with oncology service as an outpatient -Hgb stable (7.4); hemodilution playing role  Open abdominal wall wound  -Will follow Wound care team recommendations   -no drainage or superimposed signs of infection  Metabolic acidosis- Due to High Output Ostomy, and Resolving Infection   -most likely  triggered by c. Diff infection and diarrhea -will treat c. Diff -continue bicarb drip but will adjust rate to 75 cc per hour;  -BMET in am  COPD (chronic obstructive pulmonary disease)  -Monitor O2 Sats and Albuterol Nebs PRN  -prn oxygen supplementation  Anemia of chronic disease -  -Anemia panel shows anemia of chronic disease with low iron. -status post 1 units of PRBC's; Hgb 7.4 (stable and no requiring further transfusion at this moment) -will hold further transfusion today; patient w/o overt bleeding and has received approx 7L of fluids (hemodilution playing role) -Check CBC in a.m.   DVT: lovenox  Code Status: Full Family Communication: daughter at bedside Disposition Plan: to be determine. remains in stepdown   Consultants:  PCCM  ID  Procedures:  See below for x-ray reports   Antibiotics: Doxycycline 8/29--8/30 Aztreonam 8/29 zyvox 8/30 IV Flagyl 8/30 Dificid 8/30   Objective: Filed Vitals:   12/18/13 1040  BP: 140/90  Pulse: 96  Temp: 98.9 F (37.2 C)  Resp: 18    Intake/Output Summary (Last 24 hours) at 12/18/13 1323 Last data filed at 12/18/13 1300  Gross per 24 hour  Intake   2555 ml  Output   1775 ml  Net    780 ml   Filed Weights   12/16/13 0500 12/17/13 0538 12/17/13 1316  Weight: 65.862 kg (145 lb 3.2 oz) 67.9 kg (149 lb 11.1 oz) 68.5 kg (151 lb 0.2 oz)    Exam:   General:  Feeling better; still with some abd discomfort and high ostomy output. Denies CP, vomiting, melena and hematochezia.   Cardiovascular:HR in the high 90's now, but sinus rhythm; no rubs or gallops  Respiratory: no wheezing, no crackles  Abdomen: soft, No distended, ileostomy in place (plenty of output); positive  BS; no guarding. abd wound w/o superimposed signs of infections or active drainage  Musculoskeletal: no swelling or erythema  Data Reviewed: Basic Metabolic Panel:  Recent Labs Lab 12/14/13 0208 12/14/13 0215 12/15/13 0505 12/16/13 0545   NA  --  130* 129* 136*  K  --  5.3 4.1 3.9  CL  --  101 99 101  CO2  --  13* 18* 22  GLUCOSE  --  99 124* 107*  BUN  --  20 20 13   CREATININE  --  1.38* 1.06 0.73  CALCIUM  --  4.8* 4.6* 4.5*  MG 0.5*  --   --  1.2*  PHOS 3.7  --   --  2.1*   Liver Function Tests:  Recent Labs Lab 12/14/13 0215  AST 17  ALT 10  ALKPHOS 306*  BILITOT 0.3  PROT 5.7*  ALBUMIN 2.0*   CBC:  Recent Labs Lab 12/14/13 0215 12/15/13 0505 12/16/13 0545  WBC 16.5* 25.5* 9.5  NEUTROABS 10.4*  --   --   HGB 7.4* 7.0* 7.4*  HCT 22.3* 19.7* 21.3*  MCV 93.3 90.4 92.2  PLT 417* 230 224   BNP (last 3 results)  Recent Labs  03/24/13 2031 03/25/13 0334 03/26/13 0414  PROBNP 9254.0* 9403.0* 4535.0*    Recent Results (from the past 240 hour(s))  MRSA PCR SCREENING     Status: Abnormal   Collection Time    12/14/13  5:30 AM      Result Value Ref Range Status   MRSA by PCR POSITIVE (*) NEGATIVE Final   Comment:            The GeneXpert MRSA Assay (FDA     approved for NASAL specimens     only), is one component of a     comprehensive MRSA colonization     surveillance program. It is not     intended to diagnose MRSA     infection nor to guide or     monitor treatment for     MRSA infections.     RESULT CALLED TO, READ BACK BY AND VERIFIED WITH:     Wallene Huh [id number] @ [phone] BY J SCOTTON  CULTURE, BLOOD (ROUTINE X 2)     Status: None   Collection Time    12/14/13  7:55 AM      Result Value Ref Range Status   Specimen Description BLOOD RIGHT HAND   Final   Special Requests BOTTLES DRAWN AEROBIC ONLY 5 CC   Final   Culture  Setup Time     Final   Value: 12/14/2013 15:22     Performed at [facility]   Culture     Final   Value:        BLOOD CULTURE RECEIVED NO GROWTH TO DATE CULTURE WILL BE HELD FOR 5 DAYS BEFORE ISSUING A FINAL NEGATIVE REPORT     Performed at [facility]   Report Status PENDING   Incomplete  CULTURE, BLOOD (ROUTINE X 2)     Status: None    Collection Time    12/14/13 10:55 AM      Result Value Ref Range Status   Specimen Description BLOOD LEFT PORTA CATH   Final   Special Requests     Final   Value: BOTTLES DRAWN AEROBIC AND ANAEROBIC 10CC IMMUNOCOMPROMISED   Culture  Setup Time     Final   Value: 12/14/2013 18:15     Performed at [facility]   Culture     Final   Value:        BLOOD CULTURE RECEIVED NO GROWTH TO DATE CULTURE WILL BE HELD FOR 5 DAYS BEFORE ISSUING A FINAL NEGATIVE REPORT     Performed at [facility]   Report Status PENDING   Incomplete  WOUND CULTURE     Status: None   Collection Time    12/14/13 11:15 AM      Result Value Ref Range Status   Specimen Description WOUND ABDOMINAL WOUND   Final   Special Requests NONE   Final   Gram Stain     Final   Value: NO WBC SEEN     NO SQUAMOUS EPITHELIAL CELLS SEEN     MODERATE GRAM POSITIVE COCCI     IN PAIRS IN CLUSTERS IN CHAINS     Performed at [facility]   Culture     Final   Value: MULTIPLE ORGANISMS PRESENT, NONE PREDOMINANT     Note: NO STAPHYLOCOCCUS AUREUS ISOLATED NO GROUP A STREP (S.PYOGENES) ISOLATED     Performed at [facility]   Report Status 12/17/2013 FINAL   Final  URINE CULTURE     Status: None   Collection Time    12/14/13  1:21 PM      Result Value Ref Range Status   Specimen Description URINE, CATHETERIZED   Final   Special Requests NONE   Final   Culture  Setup Time     Final   Value: 12/14/2013 19:07     Performed at [facility]     Final   Value: >=100,000 COLONIES/ML     Performed at [facility]   Culture     Final   Value: ENTEROCOCCUS SPECIES     METHICILLIN RESISTANT STAPHYLOCOCCUS AUREUS     Note: RIFAMPIN AND GENTAMICIN SHOULD NOT BE USED AS SINGLE DRUGS FOR TREATMENT OF STAPH INFECTIONS. CRITICAL RESULT CALLED TO, READ BACK BY AND VERIFIED WITH: DAWN BROWN 12/18/13 AT 0150 RIDK     Performed at [facility]   Report Status 12/18/2013 FINAL    Final   Organism ID, Bacteria ENTEROCOCCUS SPECIES   Final   Organism ID, Bacteria METHICILLIN RESISTANT STAPHYLOCOCCUS AUREUS   Final  CLOSTRIDIUM DIFFICILE BY PCR     Status: Abnormal   Collection Time    12/15/13  9:24 AM      Result Value Ref Range Status   C difficile by pcr POSITIVE (*) NEGATIVE Final   Comment: CRITICAL RESULT CALLED TO, READ BACK BY AND VERIFIED WITH:     A.ARNOLD,RN [id number] 12/15/13 M.CAMPBELL     Performed at [facility]     Studies: No results found.  Scheduled Meds: . sodium chloride   Intravenous Once  . sodium chloride   Intravenous Once  . antiseptic oral rinse  7 mL Mouth Rinse BID  . aspirin  325 mg Oral q morning - 10a  . buPROPion  300 mg Oral Daily  . digoxin  0.125 mg Intravenous Daily  . diltiazem  120 mg Oral Daily  . enoxaparin (LOVENOX) injection  40 mg Subcutaneous Q24H  . estradiol  2 mg Oral Daily  . famotidine  40 mg Oral Daily  . fidaxomicin  200 mg Oral BID  . magnesium oxide  400 mg Oral BID  . metoprolol tartrate  25 mg Oral BID  . metronidazole  500 mg Intravenous Q8H  .  multivitamin with minerals  1 tablet Oral Daily  . phosphorus  500 mg Oral TID  . saccharomyces boulardii  250 mg Oral BID  . sodium chloride  1,000 mL Intravenous Once  . sucralfate  1 g Oral TID WC & HS   Continuous Infusions: . calcium gluconate infusion(930 mg elemental calcium/L) 1 mg elemental calcium/kg/hr (12/18/13 1018)  .  sodium bicarbonate  infusion 1000 mL 75 mL/hr at 12/18/13 0114    Principal Problem:   C. difficile enteritis Active Problems:   Atrial fibrillation with rapid ventricular response   Hypotension   Chronic hyponatremia   CMML (chronic myelomonocytic leukemia)   Open abdominal wall wound   Hypocalcemia   Metabolic acidosis   MRSA carrier   COPD (chronic obstructive pulmonary disease)   Anemia of chronic disease   Leukocytosis, unspecified   Shock   Hypovolemia dehydration   Nausea    Time spent: >30  minutes    [facility] Pager [phone]. If 7PM-7AM, please contact night-coverage at [email or web address], password [facility] 12/18/2013, 1:23 PM  LOS: 4 days

## 2013-12-19 LAB — BASIC METABOLIC PANEL
ANION GAP: 9 (ref 5–15)
BUN: 6 mg/dL (ref 6–23)
CHLORIDE: 92 meq/L — AB (ref 96–112)
CO2: 32 meq/L (ref 19–32)
Calcium: 7.2 mg/dL — ABNORMAL LOW (ref 8.4–10.5)
Creatinine, Ser: 0.57 mg/dL (ref 0.50–1.10)
GFR calc Af Amer: >90 mL/min (ref >90)
GFR calc non Af Amer: >90 mL/min (ref >90)
Glucose, Bld: 102 mg/dL — ABNORMAL HIGH (ref 70–99)
POTASSIUM: 3.6 meq/L — AB (ref 3.7–5.3)
SODIUM: 133 meq/L — AB (ref 137–147)

## 2013-12-19 LAB — RENAL FUNCTION PANEL
Albumin: 1.7 g/dL — ABNORMAL LOW (ref 3.5–5.2)
Anion gap: 11 (ref 5–15)
BUN: 7 mg/dL (ref 6–23)
CHLORIDE: 93 meq/L — AB (ref 96–112)
CO2: 32 meq/L (ref 19–32)
Calcium: 6.3 mg/dL — CL (ref 8.4–10.5)
Creatinine, Ser: 0.52 mg/dL (ref 0.50–1.10)
Glucose, Bld: 93 mg/dL (ref 70–99)
Phosphorus: 3.3 mg/dL (ref 2.3–4.6)
Potassium: 2.9 mEq/L — CL (ref 3.7–5.3)
SODIUM: 136 meq/L — AB (ref 137–147)

## 2013-12-19 LAB — CBC
HCT: 22 % — ABNORMAL LOW (ref 36.0–46.0)
Hemoglobin: 7.6 g/dL — ABNORMAL LOW (ref 12.0–15.0)
MCH: 31.8 pg (ref 26.0–34.0)
MCHC: 34.5 g/dL (ref 30.0–36.0)
MCV: 92.1 fL (ref 78.0–100.0)
PLATELETS: 222 10*3/uL (ref 150–400)
RBC: 2.39 MIL/uL — AB (ref 3.87–5.11)
RDW: 18.5 % — ABNORMAL HIGH (ref 11.5–15.5)
WBC: 7.8 10*3/uL (ref 4.0–10.5)

## 2013-12-19 LAB — MAGNESIUM: Magnesium: 1.2 mg/dL — ABNORMAL LOW (ref 1.5–2.5)

## 2013-12-19 MED ORDER — MAGNESIUM SULFATE 40 MG/ML IJ SOLN
2.0000 g | Freq: Once | INTRAMUSCULAR | Status: AC
  Administered 2013-12-19: 2 g via INTRAVENOUS
  Filled 2013-12-19: qty 50

## 2013-12-19 MED ORDER — POTASSIUM CHLORIDE CRYS ER 20 MEQ PO TBCR
40.0000 meq | EXTENDED_RELEASE_TABLET | Freq: Four times a day (QID) | ORAL | Status: AC
  Administered 2013-12-19: 40 meq via ORAL
  Filled 2013-12-19 (×2): qty 2

## 2013-12-19 MED ORDER — POTASSIUM CHLORIDE 10 MEQ/100ML IV SOLN
10.0000 meq | INTRAVENOUS | Status: AC
  Administered 2013-12-19 (×4): 10 meq via INTRAVENOUS
  Filled 2013-12-19 (×4): qty 100

## 2013-12-19 MED ORDER — MAGNESIUM SULFATE 4000MG/100ML IJ SOLN
4.0000 g | Freq: Once | INTRAMUSCULAR | Status: AC
  Administered 2013-12-19: 4 g via INTRAVENOUS
  Filled 2013-12-19: qty 100

## 2013-12-19 MED ORDER — K PHOS MONO-SOD PHOS DI & MONO 155-852-130 MG PO TABS
500.0000 mg | ORAL_TABLET | Freq: Two times a day (BID) | ORAL | Status: DC
  Administered 2013-12-19 – 2013-12-22 (×6): 500 mg via ORAL
  Filled 2013-12-19 (×7): qty 2

## 2013-12-19 NOTE — Evaluation (Signed)
Occupational Therapy Evaluation Patient Details Name: [name] MRN: [id number] DOB: Oct 31, 1950 Today's Date: 12/19/2013    History of Present Illness   63yo CLL, ostomy with high output and chronic metabolic disarray who presents with nausea, poor oral intake, hypotension, and AF with RVR. +  cdiff   Clinical Impression   Pt presents to OT with decreased I with ADL activity due to problems listed below. Pt will benefit from skilled OT to increase I with ADL activity and return to PLOF    Follow Up Recommendations  Home health OT;Supervision/Assistance - 24 hour    Equipment Recommendations  None recommended by OT    Recommendations for Other Services PT consult     Precautions / Restrictions Precautions Precautions: Fall      Mobility Bed Mobility Overal bed mobility: Needs Assistance Bed Mobility: Sit to Supine;Supine to Sit     Supine to sit: Mod assist Sit to supine: Mod assist   General bed mobility comments: increased time  Transfers Overall transfer level: Needs assistance Equipment used: 1 person hand held assist Transfers: Sit to/from [employer] Transfers Sit to Stand: Mod assist Stand pivot transfers: Mod assist       General transfer comment: verbal cues for safety         ADL Overall ADL's : Needs assistance/impaired                         Toilet Transfer: Moderate assistance;BSC;Cueing for sequencing;Cueing for safety   Toileting- Clothing Manipulation and Hygiene: Moderate assistance;Sit to/from stand       Functional mobility during ADLs: Moderate assistance General ADL Comments: Pt needed VC to slow down and perform transfer to toilet safely.  Pt asking for pain meds but RN reports just giving it to her               Pertinent Vitals/Pain Pain Score: 8  Pain Location: abdomen Pain Intervention(s): Monitored during session;Patient requesting pain meds-RN notified        Extremity/Trunk Assessment Upper  Extremity Assessment Upper Extremity Assessment: Generalized weakness           Communication Communication Communication: No difficulties   Cognition Arousal/Alertness: Awake/alert Behavior During Therapy: WFL for tasks assessed/performed;Anxious Overall Cognitive Status: Within Functional Limits for tasks assessed                     General Comments   pt anxious about getting to [facility].  Pt verbalized instructions to therapist on how to A her.             Home Living Family/patient expects to be discharged to:: Private residence Living Arrangements: Children Available Help at Discharge: Family Type of Home: House Home Access: Stairs to enter Technical brewer of Steps: [place]: One level     Bathroom Shower/Tub: Occupational psychologist: [place]: Environmental consultant - 2 wheels;Cane - single point          Prior Functioning/Environment Level of Independence: Independent             OT Diagnosis: Generalized weakness   OT Problem List: Decreased strength;Decreased activity tolerance;Impaired balance (sitting and/or standing);Pain   OT Treatment/Interventions: Self-care/ADL training;Patient/family education;Balance training;Therapeutic activities    OT Goals(Current goals can be found in the care plan section) Acute Rehab OT Goals Patient Stated Goal: home with daugther to care for her OT Goal Formulation: With patient  Time For Goal Achievement: 01/02/14 Potential to Achieve Goals: Good  OT Frequency: Min 2X/week           End of Session Nurse Communication: Mobility status  Activity Tolerance: Patient limited by fatigue Patient left: in bed   Time: 1222-4114 OT Time Calculation (min): 17 min Charges:  OT General Charges $OT Visit: 1 Procedure OT Evaluation $Initial OT Evaluation Tier I: 1 Procedure OT Treatments $Self Care/Home Management : 8-22 mins G-Codes:    Payton Mccallum D January 03, 2014, 11:57  AM

## 2013-12-19 NOTE — Progress Notes (Signed)
Patient ID: [name], female   DOB: [birth year], 63 y.o.   MRN: [id number]         [facility]    Date of Admission:  12/14/2013   Total days of Clostridium difficile antibiotics 5        Principal Problem:   C. difficile enteritis Active Problems:   Atrial fibrillation with rapid ventricular response   Hypotension   Chronic hyponatremia   CMML (chronic myelomonocytic leukemia)   Open abdominal wall wound   Hypocalcemia   Metabolic acidosis   MRSA carrier   COPD (chronic obstructive pulmonary disease)   Anemia of chronic disease   Leukocytosis, unspecified   Shock   Hypovolemia dehydration   Nausea   . sodium chloride   Intravenous Once  . antiseptic oral rinse  7 mL Mouth Rinse BID  . aspirin  325 mg Oral q morning - 10a  . buPROPion  300 mg Oral Daily  . digoxin  0.125 mg Intravenous Daily  . diltiazem  120 mg Oral Daily  . enoxaparin (LOVENOX) injection  40 mg Subcutaneous Q24H  . estradiol  2 mg Oral Daily  . famotidine  40 mg Oral Daily  . fidaxomicin  200 mg Oral BID  . magnesium oxide  400 mg Oral BID  . magnesium sulfate 1 - 4 g bolus IVPB  2 g Intravenous Once  . metoprolol tartrate  25 mg Oral BID  . metronidazole  500 mg Intravenous Q8H  . multivitamin with minerals  1 tablet Oral Daily  . phosphorus  500 mg Oral BID  . potassium chloride  40 mEq Oral Q6H  . saccharomyces boulardii  250 mg Oral BID  . sucralfate  1 g Oral TID WC & HS    Subjective: She is feeling better today. Her nausea is occurring less frequently and is less severe. She is also having improvement in her abdominal cramps and is able to eat more. She has not been sleeping well here in the hospital and is eager to go home soon.  Objective: Temp:  [98.2 F (36.8 C)-98.7 F (37.1 C)] 98.6 F (37 C) (09/03 1402) Pulse Rate:  [52-88] 88 (09/03 1402) Resp:  [16-20] 20 (09/03 1402) BP: (116-142)/(72-96) 142/72 mmHg (09/03 1402) SpO2:  [90 %] 90 % (09/03  0451)  General: Alert and comfortable  Abdomen: Soft and nontender with quiet bowel sounds  Lab Results Lab Results  Component Value Date   WBC 7.8 12/19/2013   HGB 7.6* 12/19/2013   HCT 22.0* 12/19/2013   MCV 92.1 12/19/2013   PLT 222 12/19/2013    Lab Results  Component Value Date   CREATININE 0.57 12/19/2013   BUN 6 12/19/2013   NA 133* 12/19/2013   K 3.6* 12/19/2013   CL 92* 12/19/2013   CO2 32 12/19/2013     Microbiology: Recent Results (from the past 240 hour(s))  MRSA PCR SCREENING     Status: Abnormal   Collection Time    12/14/13  5:30 AM      Result Value Ref Range Status   MRSA by PCR POSITIVE (*) NEGATIVE Final   Comment:            The GeneXpert MRSA Assay (FDA     approved for NASAL specimens     only), is one component of a     comprehensive MRSA colonization     surveillance program. It is not     intended to diagnose MRSA  infection nor to guide or     monitor treatment for     MRSA infections.     RESULT CALLED TO, READ BACK BY AND VERIFIED WITH:     Wallene Huh [id number] @ [phone] BY J SCOTTON  CULTURE, BLOOD (ROUTINE X 2)     Status: None   Collection Time    12/14/13  7:55 AM      Result Value Ref Range Status   Specimen Description BLOOD RIGHT HAND   Final   Special Requests BOTTLES DRAWN AEROBIC ONLY 5 CC   Final   Culture  Setup Time     Final   Value: 12/14/2013 15:22     Performed at [facility]   Culture     Final   Value:        BLOOD CULTURE RECEIVED NO GROWTH TO DATE CULTURE WILL BE HELD FOR 5 DAYS BEFORE ISSUING A FINAL NEGATIVE REPORT     Performed at [facility]   Report Status PENDING   Incomplete  CULTURE, BLOOD (ROUTINE X 2)     Status: None   Collection Time    12/14/13 10:55 AM      Result Value Ref Range Status   Specimen Description BLOOD LEFT PORTA CATH   Final   Special Requests     Final   Value: BOTTLES DRAWN AEROBIC AND ANAEROBIC 10CC IMMUNOCOMPROMISED   Culture  Setup Time     Final   Value: 12/14/2013 18:15      Performed at [facility]   Culture     Final   Value:        BLOOD CULTURE RECEIVED NO GROWTH TO DATE CULTURE WILL BE HELD FOR 5 DAYS BEFORE ISSUING A FINAL NEGATIVE REPORT     Performed at [facility]   Report Status PENDING   Incomplete  WOUND CULTURE     Status: None   Collection Time    12/14/13 11:15 AM      Result Value Ref Range Status   Specimen Description WOUND ABDOMINAL WOUND   Final   Special Requests NONE   Final   Gram Stain     Final   Value: NO WBC SEEN     NO SQUAMOUS EPITHELIAL CELLS SEEN     MODERATE GRAM POSITIVE COCCI     IN PAIRS IN CLUSTERS IN CHAINS     Performed at [facility]   Culture     Final   Value: MULTIPLE ORGANISMS PRESENT, NONE PREDOMINANT     Note: NO STAPHYLOCOCCUS AUREUS ISOLATED NO GROUP A STREP (S.PYOGENES) ISOLATED     Performed at [facility]   Report Status 12/17/2013 FINAL   Final  URINE CULTURE     Status: None   Collection Time    12/14/13  1:21 PM      Result Value Ref Range Status   Specimen Description URINE, CATHETERIZED   Final   Special Requests NONE   Final   Culture  Setup Time     Final   Value: 12/14/2013 19:07     Performed at [place]     Final   Value: >=100,000 COLONIES/ML     Performed at [facility]   Culture     Final   Value: ENTEROCOCCUS SPECIES     METHICILLIN RESISTANT STAPHYLOCOCCUS AUREUS     Note: RIFAMPIN AND GENTAMICIN SHOULD NOT BE USED AS SINGLE DRUGS  FOR TREATMENT OF STAPH INFECTIONS. CRITICAL RESULT CALLED TO, READ BACK BY AND VERIFIED WITH: DAWN BROWN 12/18/13 AT 0150 RIDK     Performed at [facility]   Report Status 12/18/2013 FINAL   Final   Organism ID, Bacteria ENTEROCOCCUS SPECIES   Final   Organism ID, Bacteria METHICILLIN RESISTANT STAPHYLOCOCCUS AUREUS   Final  CLOSTRIDIUM DIFFICILE BY PCR     Status: Abnormal   Collection Time    12/15/13  9:24 AM      Result Value Ref Range Status   C difficile by pcr  POSITIVE (*) NEGATIVE Final   Comment: CRITICAL RESULT CALLED TO, READ BACK BY AND VERIFIED WITH:     A.ARNOLD,RN 0174 12/15/13 M.[name]     Performed at [facility]    Assessment: She is improving very slowly on therapy for C. difficile enteritis. I will discontinue IV metronidazole and plan on 9 more days of oral fidaxomicin.  Plan: 1. Discontinue IV metronidazole  2. Continue oral fidaxomicin 9 more days  Michel Bickers, MD [facility] for Infectious [place] [phone] pager   [phone] cell 12/19/2013, 4:11 PM

## 2013-12-19 NOTE — Progress Notes (Signed)
[facility] PROGRESS NOTE  [name] GGY:[id number] DOB: [birth year] DOA: 12/14/2013 PCP: Darlin Coco, MD  HPI/Subjective: Feels much better than yesterday, denies any nausea vomiting, her appetite still poor.  Assessment/Plan:  Hypovolemia dehydration/Shock/ Hypotension- due to High Output Ostomy and c. Diff infection -Continue IVFs for Fluid Resuscitation  -Central line in place and will target 8-10 on CVP -With follow clinical response; if remains stable will transfer out of the unit on 9/1 -ID on board will follow recommendations for antibiotic adjustment in length of therapy.  C. Diff enteritis: will treat with dificid and IV flagyl -severe infection based on algorithm -Florastor added -avoiding protonix due to C. Diff infection -patient allergic to vanc -ID consulted will follow recommendations  MRSA/enterococcus infection in urine: appears to be colonization -patient has received 26 days of zyvox.  -per ID treat just C. Diff and discontinue the rest of other antibiotics  Atrial fibrillation with rapid ventricular response  -Due to #1  -continue IVFs, metoprolol, cardizem and digoxin -heart rate in the 90's; who has now converted to sinus rhythm.  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 On 8/31 sodium 136  CMML (chronic myelomonocytic leukemia)  -stable  -continue follow up with oncology service as an outpatient -Hgb stable (7.4); hemodilution playing role  Open abdominal wall wound  -Will follow Wound care team recommendations   -no drainage or superimposed signs of infection  Metabolic acidosis- Due to High Output Ostomy, and Resolving Infection    -most likely triggered by c. Diff infection and diarrhea -will treat c. Diff -continue bicarb drip but will adjust rate to 75 cc per hour;  -BMET in am  COPD (chronic obstructive pulmonary disease)  -Monitor O2 Sats and Albuterol Nebs PRN  -prn oxygen supplementation  Anemia of chronic disease -  -Anemia panel shows anemia of chronic disease with low iron. -status post 1 units of PRBC's; Hgb 7.4 (stable and no requiring further transfusion at this moment) -will hold further transfusion today; patient w/o overt bleeding and has received approx 7L of fluids (hemodilution playing role) -Check CBC in a.m.   DVT: lovenox  Code Status: Full Family Communication: daughter at bedside Disposition Plan: to be determine. remains in stepdown   Consultants:  PCCM  ID  Procedures:  See below for x-ray reports   Antibiotics: Doxycycline 8/29--8/30 Aztreonam 8/29 zyvox 8/30 IV Flagyl 8/30 Dificid 8/30   Objective: Filed Vitals:   12/19/13 1402  BP: 142/72  Pulse: 88  Temp: 98.6 F (37 C)  Resp: 20    Intake/Output Summary (Last 24 hours) at 12/19/13 1424 Last data filed at 12/19/13 1410  Gross per 24 hour  Intake 4246.9 ml  Output   1150 ml  Net 3096.9 ml   Filed Weights   12/16/13 0500 12/17/13 0538 12/17/13 1316  Weight: 65.862 kg (145 lb 3.2 oz) 67.9 kg (149 lb 11.1 oz) 68.5 kg (151 lb 0.2 oz)    Exam:   General:  Feeling better; still with some abd discomfort and high ostomy output. Denies CP, vomiting, melena and hematochezia.   Cardiovascular:HR in the high 90's now, but sinus rhythm; no rubs or gallops  Respiratory: no wheezing, no crackles  Abdomen: soft, No distended, ileostomy in place (plenty of output); positive BS; no  guarding. abd wound w/o superimposed signs of infections or active drainage  Musculoskeletal: no swelling or erythema  Data Reviewed: Basic Metabolic Panel:  Recent Labs Lab 12/14/13 0208 12/14/13 0215 12/15/13 0505  12/16/13 0545 12/19/13 0410 12/19/13 0411  NA  --  130* 129* 136*  --  136*  K  --  5.3 4.1 3.9  --  2.9*  CL  --  101 99 101  --  93*  CO2  --  13* 18* 22  --  32  GLUCOSE  --  99 124* 107*  --  93  BUN  --  20 20 13   --  7  CREATININE  --  1.38* 1.06 0.73  --  0.52  CALCIUM  --  4.8* 4.6* 4.5*  --  6.3*  MG 0.5*  --   --  1.2* 1.2*  --   PHOS 3.7  --   --  2.1*  --  3.3   Liver Function Tests:  Recent Labs Lab 12/14/13 0215 12/19/13 0411  AST 17  --   ALT 10  --   ALKPHOS 306*  --   BILITOT 0.3  --   PROT 5.7*  --   ALBUMIN 2.0* 1.7*   CBC:  Recent Labs Lab 12/14/13 0215 12/15/13 0505 12/16/13 0545 12/19/13 0410  WBC 16.5* 25.5* 9.5 7.8  NEUTROABS 10.4*  --   --   --   HGB 7.4* 7.0* 7.4* 7.6*  HCT 22.3* 19.7* 21.3* 22.0*  MCV 93.3 90.4 92.2 92.1  PLT 417* 230 224 222   BNP (last 3 results)  Recent Labs  03/24/13 2031 03/25/13 0334 03/26/13 0414  PROBNP 9254.0* 9403.0* 4535.0*    Recent Results (from the past 240 hour(s))  MRSA PCR SCREENING     Status: Abnormal   Collection Time    12/14/13  5:30 AM      Result Value Ref Range Status   MRSA by PCR POSITIVE (*) NEGATIVE Final   Comment:            The GeneXpert MRSA Assay (FDA     approved for NASAL specimens     only), is one component of a     comprehensive MRSA colonization     surveillance program. It is not     intended to diagnose MRSA     infection nor to guide or     monitor treatment for     MRSA infections.     RESULT CALLED TO, READ BACK BY AND VERIFIED WITH:     Wallene Huh [id number] @ [phone] BY J SCOTTON  CULTURE, BLOOD (ROUTINE X 2)     Status: None   Collection Time    12/14/13  7:55 AM      Result Value Ref Range Status   Specimen Description BLOOD RIGHT HAND   Final   Special Requests BOTTLES DRAWN AEROBIC ONLY 5 CC   Final   Culture  Setup Time     Final   Value: 12/14/2013 15:22     Performed at [facility]   Culture     Final   Value:        BLOOD CULTURE  RECEIVED NO GROWTH TO DATE CULTURE WILL BE HELD FOR 5 DAYS BEFORE ISSUING A FINAL NEGATIVE REPORT     Performed at [facility]   Report Status PENDING   Incomplete  CULTURE, BLOOD (ROUTINE X 2)     Status: None   Collection Time  12/14/13 10:55 AM      Result Value Ref Range Status   Specimen Description BLOOD LEFT PORTA CATH   Final   Special Requests     Final   Value: BOTTLES DRAWN AEROBIC AND ANAEROBIC 10CC IMMUNOCOMPROMISED   Culture  Setup Time     Final   Value: 12/14/2013 18:15     Performed at [facility]   Culture     Final   Value:        BLOOD CULTURE RECEIVED NO GROWTH TO DATE CULTURE WILL BE HELD FOR 5 DAYS BEFORE ISSUING A FINAL NEGATIVE REPORT     Performed at [facility]   Report Status PENDING   Incomplete  WOUND CULTURE     Status: None   Collection Time    12/14/13 11:15 AM      Result Value Ref Range Status   Specimen Description WOUND ABDOMINAL WOUND   Final   Special Requests NONE   Final   Gram Stain     Final   Value: NO WBC SEEN     NO SQUAMOUS EPITHELIAL CELLS SEEN     MODERATE GRAM POSITIVE COCCI     IN PAIRS IN CLUSTERS IN CHAINS     Performed at [facility]   Culture     Final   Value: MULTIPLE ORGANISMS PRESENT, NONE PREDOMINANT     Note: NO STAPHYLOCOCCUS AUREUS ISOLATED NO GROUP A STREP (S.PYOGENES) ISOLATED     Performed at [facility]   Report Status 12/17/2013 FINAL   Final  URINE CULTURE     Status: None   Collection Time    12/14/13  1:21 PM      Result Value Ref Range Status   Specimen Description URINE, CATHETERIZED   Final   Special Requests NONE   Final   Culture  Setup Time     Final   Value: 12/14/2013 19:07     Performed at [place]     Final   Value: >=100,000 COLONIES/ML     Performed at [facility]   Culture     Final   Value: ENTEROCOCCUS SPECIES     METHICILLIN RESISTANT STAPHYLOCOCCUS AUREUS     Note: RIFAMPIN AND GENTAMICIN SHOULD NOT  BE USED AS SINGLE DRUGS FOR TREATMENT OF STAPH INFECTIONS. CRITICAL RESULT CALLED TO, READ BACK BY AND VERIFIED WITH: DAWN BROWN 12/18/13 AT 0150 RIDK     Performed at [facility]   Report Status 12/18/2013 FINAL   Final   Organism ID, Bacteria ENTEROCOCCUS SPECIES   Final   Organism ID, Bacteria METHICILLIN RESISTANT STAPHYLOCOCCUS AUREUS   Final  CLOSTRIDIUM DIFFICILE BY PCR     Status: Abnormal   Collection Time    12/15/13  9:24 AM      Result Value Ref Range Status   C difficile by pcr POSITIVE (*) NEGATIVE Final   Comment: CRITICAL RESULT CALLED TO, READ BACK BY AND VERIFIED WITH:     A.ARNOLD,RN 3354 12/15/13 M.CAMPBELL     Performed at [facility]     Studies: No results found.  Scheduled Meds: . sodium chloride   Intravenous Once  . sodium chloride   Intravenous Once  . antiseptic oral rinse  7 mL Mouth Rinse BID  . aspirin  325 mg Oral q morning - 10a  . buPROPion  300 mg Oral Daily  . digoxin  0.125 mg Intravenous Daily  .  diltiazem  120 mg Oral Daily  . enoxaparin (LOVENOX) injection  40 mg Subcutaneous Q24H  . estradiol  2 mg Oral Daily  . famotidine  40 mg Oral Daily  . fidaxomicin  200 mg Oral BID  . magnesium oxide  400 mg Oral BID  . metoprolol tartrate  25 mg Oral BID  . metronidazole  500 mg Intravenous Q8H  . multivitamin with minerals  1 tablet Oral Daily  . phosphorus  500 mg Oral TID  . potassium chloride  40 mEq Oral Q6H  . saccharomyces boulardii  250 mg Oral BID  . sodium chloride  1,000 mL Intravenous Once  . sucralfate  1 g Oral TID WC & HS   Continuous Infusions: . calcium gluconate infusion(930 mg elemental calcium/L) 1 mg elemental calcium/kg/hr (12/19/13 0331)  .  sodium bicarbonate  infusion 1000 mL 75 mL/hr at 12/19/13 0708    Principal Problem:   C. difficile enteritis Active Problems:   Atrial fibrillation with rapid ventricular response   Hypotension   Chronic hyponatremia   CMML (chronic myelomonocytic leukemia)    Open abdominal wall wound   Hypocalcemia   Metabolic acidosis   MRSA carrier   COPD (chronic obstructive pulmonary disease)   Anemia of chronic disease   Leukocytosis, unspecified   Shock   Hypovolemia dehydration   Nausea    Time spent: >30 minutes    [facility] Pager [phone]. If 7PM-7AM, please contact night-coverage at [email or web address], password [facility] 12/19/2013, 2:24 PM  LOS: 5 days

## 2013-12-19 NOTE — Progress Notes (Signed)
PT Cancellation Note  Patient Details Name: [name] MRN: [id number] DOB: February 24, 1951   Cancelled Treatment:    Reason Eval/Treat Not Completed: Fatigue/lethargy limiting ability to participate (pt declined today, states to try tomorrow)   York Ram E 12/19/2013, 9:58 AM Carmelia Bake, PT, DPT 12/19/2013 Pager: [phone]

## 2013-12-20 LAB — RENAL FUNCTION PANEL
Albumin: 1.9 g/dL — ABNORMAL LOW (ref 3.5–5.2)
Anion gap: 11 (ref 5–15)
BUN: 7 mg/dL (ref 6–23)
CO2: 31 meq/L (ref 19–32)
Calcium: 8.5 mg/dL (ref 8.4–10.5)
Chloride: 93 meq/L — ABNORMAL LOW (ref 96–112)
Creatinine, Ser: 0.59 mg/dL (ref 0.50–1.10)
GFR calc Af Amer: >90 mL/min
GFR calc non Af Amer: >90 mL/min
Glucose, Bld: 102 mg/dL — ABNORMAL HIGH (ref 70–99)
Phosphorus: 3.6 mg/dL (ref 2.3–4.6)
Potassium: 3.4 meq/L — ABNORMAL LOW (ref 3.7–5.3)
Sodium: 135 meq/L — ABNORMAL LOW (ref 137–147)

## 2013-12-20 LAB — CULTURE, BLOOD (ROUTINE X 2)
Culture: NO GROWTH
Culture: NO GROWTH

## 2013-12-20 LAB — CBC
HCT: 23.1 % — ABNORMAL LOW (ref 36.0–46.0)
Hemoglobin: 7.8 g/dL — ABNORMAL LOW (ref 12.0–15.0)
MCH: 31.6 pg (ref 26.0–34.0)
MCHC: 33.8 g/dL (ref 30.0–36.0)
MCV: 93.5 fL (ref 78.0–100.0)
Platelets: 236 K/uL (ref 150–400)
RBC: 2.47 MIL/uL — ABNORMAL LOW (ref 3.87–5.11)
RDW: 18.7 % — ABNORMAL HIGH (ref 11.5–15.5)
WBC: 10.4 K/uL (ref 4.0–10.5)

## 2013-12-20 LAB — MAGNESIUM: Magnesium: 1.6 mg/dL (ref 1.5–2.5)

## 2013-12-20 MED ORDER — SODIUM CHLORIDE 0.9 % IV SOLN
INTRAVENOUS | Status: DC
  Administered 2013-12-20: 11:00:00 via INTRAVENOUS

## 2013-12-20 MED ORDER — POTASSIUM CHLORIDE CRYS ER 20 MEQ PO TBCR
60.0000 meq | EXTENDED_RELEASE_TABLET | Freq: Four times a day (QID) | ORAL | Status: AC
  Administered 2013-12-20 (×2): 60 meq via ORAL
  Filled 2013-12-20 (×2): qty 3

## 2013-12-20 MED ORDER — FUROSEMIDE 10 MG/ML IJ SOLN
40.0000 mg | Freq: Once | INTRAMUSCULAR | Status: AC
  Administered 2013-12-20: 40 mg via INTRAVENOUS
  Filled 2013-12-20: qty 4

## 2013-12-20 MED ORDER — MAGNESIUM SULFATE 40 MG/ML IJ SOLN
2.0000 g | Freq: Once | INTRAMUSCULAR | Status: AC
  Administered 2013-12-20: 2 g via INTRAVENOUS
  Filled 2013-12-20: qty 50

## 2013-12-20 NOTE — Progress Notes (Signed)
[facility] PROGRESS NOTE  [name] LZJ:[id number] DOB: Feb 22, 1951 DOA: 12/14/2013 PCP: Darlin Coco, MD  HPI/Subjective: Appears to be much better than yesterday, complaining about lower extremity swelling  Assessment/Plan:  Hypovolemia dehydration/Shock/ Hypotension- due to High Output Ostomy and c. Diff infection -Continue IVFs for Fluid Resuscitation  -Patient required central line for IV fluids and medications at the time of admission. -With follow clinical response; if remains stable will transfer out of the unit on 9/1 -ID on board will follow recommendations for antibiotic adjustment in length of therapy.  C. Diff enteritis:  -severe infection based on algorithm was treated with Dificid, was on Flagyl but it was discontinued. -Florastor added -avoiding protonix due to C. Diff infection -patient allergic to vanc -ID consulted will follow recommendations  MRSA/enterococcus infection in urine: appears to be colonization -patient has received 26 days of zyvox.  -per ID treat just C. Diff and discontinue the rest of other antibiotics  Atrial fibrillation with rapid ventricular response  -Due to #1  -continue IVFs, metoprolol, cardizem and digoxin -heart rate in the 90's; who has now converted to sinus rhythm.  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 On 8/31 sodium 136  CMML (chronic myelomonocytic leukemia)  -stable  -continue follow up with oncology service as an outpatient -Hgb stable (7.4); hemodilution playing role  Open abdominal wall wound  -Will follow Wound care team recommendations   -no drainage or superimposed signs of infection  Metabolic  acidosis- Due to High Output Ostomy, and Resolving Infection   -most likely triggered by c. Diff infection and diarrhea -will treat c. Diff -continue bicarb drip but will adjust rate to 75 cc per hour;  -BMET in am  COPD (chronic obstructive pulmonary disease)  -Monitor O2 Sats and Albuterol Nebs PRN  -prn oxygen supplementation  Anemia of chronic disease -  -Anemia panel shows anemia of chronic disease with low iron. -status post 1 units of PRBC's; Hgb 7.4 (stable and no requiring further transfusion at this moment) -will hold further transfusion today; patient w/o overt bleeding and has received approx 7L of fluids (hemodilution playing role) -Check CBC in a.m.   DVT: lovenox  Code Status: Full Family Communication: daughter at bedside Disposition Plan: to be determine. remains in stepdown   Consultants:  PCCM  ID  Procedures:  See below for x-ray reports   Antibiotics: Doxycycline 8/29--8/30 Aztreonam 8/29 zyvox 8/30 IV Flagyl 8/30 Dificid 8/30   Objective: Filed Vitals:   12/20/13 0639  BP: 144/80  Pulse: 83  Temp: 97.7 F (36.5 C)  Resp: 18    Intake/Output Summary (Last 24 hours) at 12/20/13 1458 Last data filed at 12/20/13 1305  Gross per 24 hour  Intake 1284.4 ml  Output   2225 ml  Net -940.6 ml   Filed Weights   12/16/13 0500 12/17/13 0538 12/17/13 1316  Weight: 65.862 kg (145 lb 3.2 oz) 67.9 kg (149 lb 11.1 oz) 68.5 kg (151 lb 0.2 oz)    Exam:   General:  Feeling better; still with some abd discomfort and high ostomy output. Denies CP, vomiting, melena and hematochezia.   Cardiovascular:HR in the high 90's now, but sinus rhythm; no rubs or gallops  Respiratory: no wheezing, no crackles  Abdomen: soft, No distended, ileostomy  in place (plenty of output); positive BS; no guarding. abd wound w/o superimposed signs of infections or active drainage  Musculoskeletal: no swelling or erythema  Data Reviewed: Basic Metabolic  Panel:  Recent Labs Lab 12/14/13 0208  12/15/13 0505 12/16/13 0545 12/19/13 0410 12/19/13 0411 12/19/13 1500 12/20/13 0357  NA  --   < > 129* 136*  --  136* 133* 135*  K  --   < > 4.1 3.9  --  2.9* 3.6* 3.4*  CL  --   < > 99 101  --  93* 92* 93*  CO2  --   < > 18* 22  --  32 32 31  GLUCOSE  --   < > 124* 107*  --  93 102* 102*  BUN  --   < > 20 13  --  7 6 7   CREATININE  --   < > 1.06 0.73  --  0.52 0.57 0.59  CALCIUM  --   < > 4.6* 4.5*  --  6.3* 7.2* 8.5  MG 0.5*  --   --  1.2* 1.2*  --   --  1.6  PHOS 3.7  --   --  2.1*  --  3.3  --  3.6  < > = values in this interval not displayed. Liver Function Tests:  Recent Labs Lab 12/14/13 0215 12/19/13 0411 12/20/13 0357  AST 17  --   --   ALT 10  --   --   ALKPHOS 306*  --   --   BILITOT 0.3  --   --   PROT 5.7*  --   --   ALBUMIN 2.0* 1.7* 1.9*   CBC:  Recent Labs Lab 12/14/13 0215 12/15/13 0505 12/16/13 0545 12/19/13 0410 12/20/13 0357  WBC 16.5* 25.5* 9.5 7.8 10.4  NEUTROABS 10.4*  --   --   --   --   HGB 7.4* 7.0* 7.4* 7.6* 7.8*  HCT 22.3* 19.7* 21.3* 22.0* 23.1*  MCV 93.3 90.4 92.2 92.1 93.5  PLT 417* 230 224 222 236   BNP (last 3 results)  Recent Labs  03/24/13 2031 03/25/13 0334 03/26/13 0414  PROBNP 9254.0* 9403.0* 4535.0*    Recent Results (from the past 240 hour(s))  MRSA PCR SCREENING     Status: Abnormal   Collection Time    12/14/13  5:30 AM      Result Value Ref Range Status   MRSA by PCR POSITIVE (*) NEGATIVE Final   Comment:            The GeneXpert MRSA Assay (FDA     approved for NASAL specimens     only), is one component of a     comprehensive MRSA colonization     surveillance program. It is not     intended to diagnose MRSA     infection nor to guide or     monitor treatment for     MRSA infections.     RESULT CALLED TO, READ BACK BY AND VERIFIED WITH:     Wallene Huh [id number] @ [phone] BY J SCOTTON  CULTURE, BLOOD (ROUTINE X 2)     Status: None   Collection Time     12/14/13  7:55 AM      Result Value Ref Range Status   Specimen Description BLOOD RIGHT HAND   Final   Special Requests BOTTLES DRAWN AEROBIC ONLY 5 CC   Final   Culture  Setup Time  Final   Value: 12/14/2013 15:22     Performed at [facility]   Culture     Final   Value: NO GROWTH 5 DAYS     Performed at [facility]   Report Status 12/20/2013 FINAL   Final  CULTURE, BLOOD (ROUTINE X 2)     Status: None   Collection Time    12/14/13 10:55 AM      Result Value Ref Range Status   Specimen Description BLOOD LEFT PORTA CATH   Final   Special Requests     Final   Value: BOTTLES DRAWN AEROBIC AND ANAEROBIC 10CC IMMUNOCOMPROMISED   Culture  Setup Time     Final   Value: 12/14/2013 18:15     Performed at [facility]   Culture     Final   Value: NO GROWTH 5 DAYS     Performed at [facility]   Report Status 12/20/2013 FINAL   Final  WOUND CULTURE     Status: None   Collection Time    12/14/13 11:15 AM      Result Value Ref Range Status   Specimen Description WOUND ABDOMINAL WOUND   Final   Special Requests NONE   Final   Gram Stain     Final   Value: NO WBC SEEN     NO SQUAMOUS EPITHELIAL CELLS SEEN     MODERATE GRAM POSITIVE COCCI     IN PAIRS IN CLUSTERS IN CHAINS     Performed at [facility]   Culture     Final   Value: MULTIPLE ORGANISMS PRESENT, NONE PREDOMINANT     Note: NO STAPHYLOCOCCUS AUREUS ISOLATED NO GROUP A STREP (S.PYOGENES) ISOLATED     Performed at [facility]   Report Status 12/17/2013 FINAL   Final  URINE CULTURE     Status: None   Collection Time    12/14/13  1:21 PM      Result Value Ref Range Status   Specimen Description URINE, CATHETERIZED   Final   Special Requests NONE   Final   Culture  Setup Time     Final   Value: 12/14/2013 19:07     Performed at [facility]     Final   Value: >=100,000 COLONIES/ML     Performed at [facility]   Culture     Final    Value: ENTEROCOCCUS SPECIES     METHICILLIN RESISTANT STAPHYLOCOCCUS AUREUS     Note: RIFAMPIN AND GENTAMICIN SHOULD NOT BE USED AS SINGLE DRUGS FOR TREATMENT OF STAPH INFECTIONS. CRITICAL RESULT CALLED TO, READ BACK BY AND VERIFIED WITH: DAWN BROWN 12/18/13 AT 0150 RIDK     Performed at [facility]   Report Status 12/18/2013 FINAL   Final   Organism ID, Bacteria ENTEROCOCCUS SPECIES   Final   Organism ID, Bacteria METHICILLIN RESISTANT STAPHYLOCOCCUS AUREUS   Final  CLOSTRIDIUM DIFFICILE BY PCR     Status: Abnormal   Collection Time    12/15/13  9:24 AM      Result Value Ref Range Status   C difficile by pcr POSITIVE (*) NEGATIVE Final   Comment: CRITICAL RESULT CALLED TO, READ BACK BY AND VERIFIED WITH:     A.ARNOLD,RN [id number] 12/15/13 M.CAMPBELL     Performed at [facility]     Studies: No results found.  Scheduled Meds: . antiseptic oral rinse  7 mL Mouth Rinse  BID  . aspirin  325 mg Oral q morning - 10a  . buPROPion  300 mg Oral Daily  . digoxin  0.125 mg Intravenous Daily  . diltiazem  120 mg Oral Daily  . enoxaparin (LOVENOX) injection  40 mg Subcutaneous Q24H  . estradiol  2 mg Oral Daily  . famotidine  40 mg Oral Daily  . fidaxomicin  200 mg Oral BID  . magnesium oxide  400 mg Oral BID  . metoprolol tartrate  25 mg Oral BID  . multivitamin with minerals  1 tablet Oral Daily  . phosphorus  500 mg Oral BID  . saccharomyces boulardii  250 mg Oral BID  . sucralfate  1 g Oral TID WC & HS   Continuous Infusions: . sodium chloride 10 mL/hr at 12/20/13 1121    Principal Problem:   C. difficile enteritis Active Problems:   Atrial fibrillation with rapid ventricular response   Hypotension   Chronic hyponatremia   CMML (chronic myelomonocytic leukemia)   Open abdominal wall wound   Hypocalcemia   Metabolic acidosis   MRSA carrier   COPD (chronic obstructive pulmonary disease)   Anemia of chronic disease   Leukocytosis, unspecified   Shock    Hypovolemia dehydration   Nausea    Time spent: >30 minutes    [facility] Pager [phone]. If 7PM-7AM, please contact night-coverage at [email or web address], password [facility] 12/20/2013, 2:58 PM  LOS: 6 days

## 2013-12-20 NOTE — Progress Notes (Signed)
Patient ID: [name], female   DOB: 06-26-1950, 63 y.o.   MRN: [id number]         [facility]    Date of Admission:  12/14/2013           Day 6 fidaxomicin  Principal Problem:   C. difficile enteritis Active Problems:   Atrial fibrillation with rapid ventricular response   Hypotension   Chronic hyponatremia   CMML (chronic myelomonocytic leukemia)   Open abdominal wall wound   Hypocalcemia   Metabolic acidosis   MRSA carrier   COPD (chronic obstructive pulmonary disease)   Anemia of chronic disease   Leukocytosis, unspecified   Shock   Hypovolemia dehydration   Nausea   . sodium chloride   Intravenous Once  . antiseptic oral rinse  7 mL Mouth Rinse BID  . aspirin  325 mg Oral q morning - 10a  . buPROPion  300 mg Oral Daily  . digoxin  0.125 mg Intravenous Daily  . diltiazem  120 mg Oral Daily  . enoxaparin (LOVENOX) injection  40 mg Subcutaneous Q24H  . estradiol  2 mg Oral Daily  . famotidine  40 mg Oral Daily  . fidaxomicin  200 mg Oral BID  . magnesium oxide  400 mg Oral BID  . metoprolol tartrate  25 mg Oral BID  . multivitamin with minerals  1 tablet Oral Daily  . phosphorus  500 mg Oral BID  . saccharomyces boulardii  250 mg Oral BID  . sucralfate  1 g Oral TID WC & HS    Subjective: She is feeling better overall. Her nausea and cramps have improved. She ate [place] toast and sausage for breakfast. She is a little worried about going home today because of increased pedal edema since admission. She has been up walking in her room.  Objective: Temp:  [97.7 F (36.5 C)-98.6 F (37 C)] 97.7 F (36.5 C) (09/04 0639) Pulse Rate:  [63-91] 83 (09/04 0639) Resp:  [18-21] 18 (09/04 0639) BP: (139-145)/(72-91) 144/80 mmHg (09/04 0639) SpO2:  [89 %-94 %] 89 % (09/04 0639)  General: She is smiling and in no distress Skin: No rash Lungs: Clear Cor: Regular S1 and S2 no murmurs Abdomen: Soft with mild left upper quadrant  tenderness  Lab Results Lab Results  Component Value Date   WBC 10.4 12/20/2013   HGB 7.8* 12/20/2013   HCT 23.1* 12/20/2013   MCV 93.5 12/20/2013   PLT 236 12/20/2013    Lab Results  Component Value Date   CREATININE 0.59 12/20/2013   BUN 7 12/20/2013   NA 135* 12/20/2013   K 3.4* 12/20/2013   CL 93* 12/20/2013   CO2 31 12/20/2013     Assessment: She is improving slowly on therapy for C. difficile enteritis.  Plan: 1. Continue fidaxomicin 8 more days 2. Please call Dr. Lita Mains [phone]) for any infectious disease questions over the holiday weekend  Michel Bickers, MD [facility] [place] [phone] pager   [phone] cell 12/20/2013, 9:15 AM

## 2013-12-20 NOTE — Evaluation (Signed)
Physical Therapy Evaluation Patient Details Name: [name] MRN: [id number] DOB: [birth year] Today's Date: 12/20/2013   History of Present Illness  63 y.o. female with a history of CML,  Ischemic Colitis in 01/2012 S/P small bowel resection and Ileostomy placement and admitted with c-diff.  Clinical Impression  Pt currently with functional limitations due to the deficits listed below (see PT Problem List).  Pt will benefit from skilled PT to increase their independence and safety with mobility to allow discharge to the venue listed below.  Pt ambulated short distance in hallway, limited by abdominal pain and fatigue.  Pt reports she will consider HHPT but uncertain at this time.     Follow Up Recommendations Home health PT;Supervision for mobility/OOB    Equipment Recommendations  None recommended by PT    Recommendations for Other Services       Precautions / Restrictions Precautions Precautions: Fall      Mobility  Bed Mobility Overal bed mobility: Needs Assistance Bed Mobility: Supine to Sit;Sit to Supine     Supine to sit: Mod assist Sit to supine: Mod assist   General bed mobility comments: assist for trunk upright and LEs onto bed  Transfers Overall transfer level: Needs assistance Equipment used: 2 person hand held assist Transfers: Sit to/from Stand Sit to Stand: Mod assist         General transfer comment: verbal cues for technique however pt and daughter report daughter assists pt to stand lifting under armpits  Ambulation/Gait Ambulation/Gait assistance: Min assist Ambulation Distance (Feet): 50 Feet Assistive device: 2 person hand held assist Gait Pattern/deviations: Step-through pattern;Decreased stride length;Narrow base of support Gait velocity: decr   General Gait Details: pt reports she usually holds daughter's hand for mobility however has also used RW at home, pt wished to ambulate with 2 HHA today, distance limited by abdominal pain and  fatigue  Stairs            Wheelchair Mobility    Modified Rankin (Stroke Patients Only)       Balance                                             Pertinent Vitals/Pain Pain Assessment: 0-10 Pain Score: 8  Pain Location: abdomen Pain Descriptors / Indicators: Discomfort Pain Intervention(s): Premedicated before session;Monitored during session;Limited activity within patient's tolerance    Home Living Family/patient expects to be discharged to:: Private residence Living Arrangements: Children Available Help at Discharge: Family Type of Home: House Home Access: Stairs to enter   Technical brewer of Steps: 4 Home Layout: One level Home Equipment: Environmental consultant - 2 wheels;Cane - single point;Bedside commode;Shower seat      Prior Function Level of Independence: Needs assistance   Gait / Transfers Assistance Needed: daughter reports being present and assisting with transfers and mobility           Hand Dominance        Extremity/Trunk Assessment               Lower Extremity Assessment: Generalized weakness         Communication   Communication: No difficulties  Cognition Arousal/Alertness: Awake/alert Behavior During Therapy: WFL for tasks assessed/performed;Anxious Overall Cognitive Status: Within Functional Limits for tasks assessed                      General  Comments      Exercises        Assessment/Plan    PT Assessment Patient needs continued PT services  PT Diagnosis Difficulty walking   PT Problem List Decreased strength;Decreased activity tolerance;Decreased mobility;Decreased balance;Pain  PT Treatment Interventions Gait training;DME instruction;Patient/family education;Functional mobility training;Therapeutic activities;Therapeutic exercise;Stair training   PT Goals (Current goals can be found in the Care Plan section) Acute Rehab PT Goals Patient Stated Goal: home as soon as possible, would  like to think about HHPT PT Goal Formulation: With patient Time For Goal Achievement: 12/27/13 Potential to Achieve Goals: Good    Frequency Min 3X/week   Barriers to discharge        Co-evaluation               End of Session Equipment Utilized During Treatment: Oxygen Activity Tolerance: Patient limited by fatigue;Patient limited by pain Patient left: with call bell/phone within reach;in bed;with bed alarm set;with family/visitor present Nurse Communication: Mobility status         Time: 0932-3557 PT Time Calculation (min): 23 min   Charges:   PT Evaluation $Initial PT Evaluation Tier I: 1 Procedure PT Treatments $Gait Training: 8-22 mins   PT G Codes:          Detrick Dani,KATHrine E 12/20/2013, 12:50 PM Carmelia Bake, PT, DPT 12/20/2013 Pager: [phone]

## 2013-12-20 NOTE — Progress Notes (Signed)
Occupational Therapy Treatment Patient Details Name: [name] MRN: [id number] DOB: October 26, 1950 Today's Date: 12/20/2013    History of present illness 63 y.o. female with a history of CML,  Ischemic Colitis in 01/2012 S/P small bowel resection and Ileostomy placement and admitted with c-diff.   OT comments  Pt with very limited activity this pm.  She would only move to EOB with max encouragement and coaxing where she sat for ~15 mins while therapist attempted to encourage her to participate in further activity.  Pt cites pain and nausea as reason why she couldn't do more.  She insisted on returning to bed.  Will continue next week.   Follow Up Recommendations  Home health OT;Supervision/Assistance - 24 hour    Equipment Recommendations  None recommended by OT    Recommendations for Other Services PT consult    Precautions / Restrictions Precautions Precautions: Fall Precaution Comments: reports hx of low BP, monitor vitals       Mobility Bed Mobility Overal bed mobility: Needs Assistance Bed Mobility: Supine to Sit;Sit to Supine     Supine to sit: Mod assist Sit to supine: Min assist   General bed mobility comments: Pt requires step by step cues and assist to lift trunk.   Pt required assist to lift feet back into bed.  Step by step cues for technique  Transfers Overall transfer level: Needs assistance Equipment used: 2 person hand held assist Transfers: Sit to/from Stand Sit to Stand: Mod assist         General transfer comment: verbal cues for technique however pt and daughter report daughter assists pt to stand lifting under armpits    Balance                                   ADL Overall ADL's : Needs assistance/impaired     Grooming: Brushing hair;Set up;Bed level Grooming Details (indicate cue type and reason): Pt refused to brush hair while seated EOB - states she can't because of pain.  Once she returned to supine, she proceeded to comb  hair                               General ADL Comments: Pt required max encouragement to move to EOB.  Attempted to encourage pt to ambulate to sink to perform grooming, or ambulate in room.  Pt refusing due to pain.  Pt sat EOB x 15 mins while RN administered meds.   Pt informed of risks associated with immobility and inactivity.  SHe states she is aware, but she just can't do more than sit EOB.   Max enouragement and coaxing provided to engage her in further activity with no success      Vision                     Perception     Praxis      Cognition   Behavior During Therapy: [facility] for tasks assessed/performed;Anxious Overall Cognitive Status: Within Functional Limits for tasks assessed                       Extremity/Trunk Assessment      Lower Extremity Assessment Lower Extremity Assessment: Generalized weakness        Exercises     Shoulder Instructions       General Comments  Pertinent Vitals/ Pain       Pain Assessment: 0-10 Pain Score: 8  Pain Location: abdomen Pain Descriptors / Indicators: Burning Pain Intervention(s): Limited activity within patient's tolerance;Repositioned;Patient requesting pain meds-RN notified  Home Living Family/patient expects to be discharged to:: Private residence Living Arrangements: Children Available Help at Discharge: Family Type of Home: House Home Access: Stairs to enter Technical brewer of Steps: 4   Home Layout: One level               Home Equipment: Environmental consultant - 2 wheels;Cane - single point;Bedside commode;Shower seat          Prior Functioning/Environment Level of Independence: Needs assistance  Gait / Transfers Assistance Needed: daughter reports being present and assisting with transfers and mobility         Frequency Min 2X/week     Progress Toward Goals  OT Goals(current goals can now be found in the care plan section)  Progress towards OT goals: Not  progressing toward goals - comment (due to pain )  Acute Rehab OT Goals Patient Stated Goal: home as soon as possible, would like to think about HHPT ADL Goals Pt Will Perform Grooming: with supervision;standing Pt Will Perform Lower Body Dressing: with min guard assist;sit to/from stand Pt Will Transfer to Toilet: with supervision;bedside commode Pt Will Perform Toileting - Clothing Manipulation and hygiene: with supervision;sit to/from stand  Plan Discharge plan remains appropriate    Co-evaluation                 End of Session     Activity Tolerance Patient limited by pain   Patient Left in bed;with nursing/sitter in room   Nurse Communication Mobility status        Time: 3500-9381 OT Time Calculation (min): 19 min  Charges: OT General Charges $OT Visit: 1 Procedure OT Treatments $Therapeutic Activity: 8-22 mins  Mykale Gandolfo M 12/20/2013, 4:39 PM

## 2013-12-21 LAB — CBC
HEMATOCRIT: 21.7 % — AB (ref 36.0–46.0)
Hemoglobin: 7.4 g/dL — ABNORMAL LOW (ref 12.0–15.0)
MCH: 31.8 pg (ref 26.0–34.0)
MCHC: 34.1 g/dL (ref 30.0–36.0)
MCV: 93.1 fL (ref 78.0–100.0)
Platelets: 219 10*3/uL (ref 150–400)
RBC: 2.33 MIL/uL — ABNORMAL LOW (ref 3.87–5.11)
RDW: 19.2 % — AB (ref 11.5–15.5)
WBC: 8.8 10*3/uL (ref 4.0–10.5)

## 2013-12-21 LAB — RENAL FUNCTION PANEL
Albumin: 1.8 g/dL — ABNORMAL LOW (ref 3.5–5.2)
Anion gap: 10 (ref 5–15)
BUN: 8 mg/dL (ref 6–23)
CALCIUM: 7.8 mg/dL — AB (ref 8.4–10.5)
CHLORIDE: 96 meq/L (ref 96–112)
CO2: 31 mEq/L (ref 19–32)
Creatinine, Ser: 0.64 mg/dL (ref 0.50–1.10)
GFR calc Af Amer: >90 mL/min (ref >90)
Glucose, Bld: 92 mg/dL (ref 70–99)
Phosphorus: 4.1 mg/dL (ref 2.3–4.6)
Potassium: 4 mEq/L (ref 3.7–5.3)
Sodium: 137 mEq/L (ref 137–147)

## 2013-12-21 LAB — MAGNESIUM: MAGNESIUM: 1.3 mg/dL — AB (ref 1.5–2.5)

## 2013-12-21 MED ORDER — MAGNESIUM SULFATE 40 MG/ML IJ SOLN
2.0000 g | Freq: Once | INTRAMUSCULAR | Status: AC
  Administered 2013-12-21: 2 g via INTRAVENOUS
  Filled 2013-12-21: qty 50

## 2013-12-21 MED ORDER — MAGNESIUM SULFATE 4000MG/100ML IJ SOLN
4.0000 g | Freq: Once | INTRAMUSCULAR | Status: AC
  Administered 2013-12-21: 4 g via INTRAVENOUS
  Filled 2013-12-21: qty 100

## 2013-12-21 MED ORDER — FUROSEMIDE 10 MG/ML IJ SOLN
40.0000 mg | Freq: Three times a day (TID) | INTRAMUSCULAR | Status: DC
  Administered 2013-12-21 – 2013-12-22 (×3): 40 mg via INTRAVENOUS
  Filled 2013-12-21 (×6): qty 4

## 2013-12-21 MED ORDER — POTASSIUM CHLORIDE CRYS ER 20 MEQ PO TBCR
40.0000 meq | EXTENDED_RELEASE_TABLET | Freq: Four times a day (QID) | ORAL | Status: AC
  Administered 2013-12-21 (×2): 40 meq via ORAL
  Filled 2013-12-21 (×2): qty 2

## 2013-12-21 MED ORDER — GUAIFENESIN ER 600 MG PO TB12
1200.0000 mg | ORAL_TABLET | Freq: Two times a day (BID) | ORAL | Status: DC
  Administered 2013-12-21 – 2013-12-22 (×2): 1200 mg via ORAL
  Filled 2013-12-21 (×3): qty 2

## 2013-12-21 NOTE — Progress Notes (Signed)
[facility] PROGRESS NOTE  [name] UXN:[id number] DOB: [birth year] DOA: 12/14/2013 PCP: Darlin Coco, MD  HPI/Subjective: Still has some abdominal cramps, but better than yesterday. Still complaining about lower extremity edema, I will increase Lasix dose.  Assessment/Plan:  Hypovolemia dehydration/Shock/ Hypotension -Patient presented with creatinine of 1.38, likely to due to High Output Ostomy and c. Diff infection -Aggressively hydrated with IV fluids. -Patient required central line for IV fluids and medications at the time of admission. -With follow clinical response; if remains stable will transfer out of the unit on 9/1 -ID on board will follow recommendations for antibiotic adjustment in length of therapy.  Fluid overload -Patient developed fluid overload likely secondary to aggressive IV fluid hydration. -IV fluid discontinued, IV Lasix started. Keep leg elevated. -According to weight patient presented with 145 pounds and now 151 pounds.  C. Diff enteritis:  -Severe infection based on algorithm was treated with Dificid, was on Flagyl but it was discontinued. -Florastor added -avoiding protonix due to C. Diff infection -patient allergic to vanc -ID consulted will follow recommendations  MRSA/enterococcus infection in urine: appears to be colonization -patient has received 26 days of zyvox.  -per ID treat just C. Diff and discontinue the rest of other antibiotics  Atrial fibrillation with rapid ventricular response  -Due to #1, severe metabolic derangement is playing a role.  -continue IVFs, metoprolol, cardizem and digoxin, now controlled. -heart rate in the 90's; who has now converted to sinus rhythm.  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 On 8/31 sodium 136  CMML (chronic myelomonocytic leukemia)  -stable  -continue follow up with oncology service as an outpatient -Hgb stable (7.4); hemodilution playing role  Open abdominal wall wound  -Will follow Wound care team recommendations   -no drainage or superimposed signs of infection  Metabolic acidosis- Due to High Output Ostomy, and Resolving Infection   -most likely triggered by c. Diff infection and diarrhea -will treat c. Diff -continue bicarb drip but will adjust rate to 75 cc per hour;  -BMET in am  COPD (chronic obstructive pulmonary disease)  -Monitor O2 Sats and Albuterol Nebs PRN  -prn oxygen supplementation  Anemia of chronic disease -  -Anemia panel shows anemia of chronic disease with low iron. -status post 1 units of PRBC's; Hgb 7.4 (stable and no requiring further transfusion at this moment) -will hold further transfusion today; patient w/o overt bleeding and has received approx 7L of fluids (hemodilution playing role) -Check CBC in a.m.   DVT: lovenox  Code Status: Full Family Communication: daughter at bedside Disposition Plan: to be determine. remains in stepdown   Consultants:  PCCM  ID  Procedures:  See below for x-ray reports   Antibiotics: Doxycycline 8/29--8/30 Aztreonam 8/29 zyvox 8/30 IV Flagyl 8/30 Dificid 8/30   Objective: Filed Vitals:   12/21/13 0956  BP: 118/67  Pulse: 94  Temp: 98.6 F (37 C)  Resp: 20    Intake/Output Summary (Last 24 hours) at 12/21/13 1133 Last data filed at 12/21/13 1020  Gross per 24 hour  Intake  656.5 ml  Output   3050 ml  Net -2393.5 ml   Filed Weights   12/16/13 0500 12/17/13 0538 12/17/13 1316  Weight: 65.862 kg (145 lb 3.2 oz) 67.9 kg (149 lb  11.1 oz) 68.5 kg (151 lb 0.2 oz)    Exam:   General:  Feeling better; still with some abd discomfort and high ostomy output. Denies CP,  vomiting, melena and hematochezia.   Cardiovascular:HR in the high 90's now, but sinus rhythm; no rubs or gallops  Respiratory: no wheezing, no crackles  Abdomen: soft, No distended, ileostomy in place (plenty of output); positive BS; no guarding. abd wound w/o superimposed signs of infections or active drainage  Musculoskeletal: no swelling or erythema  Data Reviewed: Basic Metabolic Panel:  Recent Labs Lab 12/16/13 0545 12/19/13 0410 12/19/13 0411 12/19/13 1500 12/20/13 0357 12/21/13 0415  NA 136*  --  136* 133* 135* 137  K 3.9  --  2.9* 3.6* 3.4* 4.0  CL 101  --  93* 92* 93* 96  CO2 22  --  32 32 31 31  GLUCOSE 107*  --  93 102* 102* 92  BUN 13  --  7 6 7 8   CREATININE 0.73  --  0.52 0.57 0.59 0.64  CALCIUM 4.5*  --  6.3* 7.2* 8.5 7.8*  MG 1.2* 1.2*  --   --  1.6 1.3*  PHOS 2.1*  --  3.3  --  3.6 4.1   Liver Function Tests:  Recent Labs Lab 12/19/13 0411 12/20/13 0357 12/21/13 0415  ALBUMIN 1.7* 1.9* 1.8*   CBC:  Recent Labs Lab 12/15/13 0505 12/16/13 0545 12/19/13 0410 12/20/13 0357 12/21/13 0800  WBC 25.5* 9.5 7.8 10.4 8.8  HGB 7.0* 7.4* 7.6* 7.8* 7.4*  HCT 19.7* 21.3* 22.0* 23.1* 21.7*  MCV 90.4 92.2 92.1 93.5 93.1  PLT 230 224 222 236 219   BNP (last 3 results)  Recent Labs  03/24/13 2031 03/25/13 0334 03/26/13 0414  PROBNP 9254.0* 9403.0* 4535.0*    Recent Results (from the past 240 hour(s))  MRSA PCR SCREENING     Status: Abnormal   Collection Time    12/14/13  5:30 AM      Result Value Ref Range Status   MRSA by PCR POSITIVE (*) NEGATIVE Final   Comment:            The GeneXpert MRSA Assay (FDA     approved for NASAL specimens     only), is one component of a     comprehensive MRSA colonization     surveillance program. It is not     intended to diagnose MRSA     infection nor to guide or     monitor treatment for     MRSA infections.     RESULT CALLED TO, READ BACK BY AND VERIFIED WITH:     Wallene Huh [id number] @ [phone] BY J  SCOTTON  CULTURE, BLOOD (ROUTINE X 2)     Status: None   Collection Time    12/14/13  7:55 AM      Result Value Ref Range Status   Specimen Description BLOOD RIGHT HAND   Final   Special Requests BOTTLES DRAWN AEROBIC ONLY 5 CC   Final   Culture  Setup Time     Final   Value: 12/14/2013 15:22     Performed at [facility]   Culture     Final   Value: NO GROWTH 5 DAYS     Performed at [facility]   Report Status 12/20/2013 FINAL   Final  CULTURE, BLOOD (ROUTINE X 2)     Status: None   Collection Time    12/14/13 10:55 AM  Result Value Ref Range Status   Specimen Description BLOOD LEFT PORTA CATH   Final   Special Requests     Final   Value: BOTTLES DRAWN AEROBIC AND ANAEROBIC 10CC IMMUNOCOMPROMISED   Culture  Setup Time     Final   Value: 12/14/2013 18:15     Performed at [facility]   Culture     Final   Value: NO GROWTH 5 DAYS     Performed at [facility]   Report Status 12/20/2013 FINAL   Final  WOUND CULTURE     Status: None   Collection Time    12/14/13 11:15 AM      Result Value Ref Range Status   Specimen Description WOUND ABDOMINAL WOUND   Final   Special Requests NONE   Final   Gram Stain     Final   Value: NO WBC SEEN     NO SQUAMOUS EPITHELIAL CELLS SEEN     MODERATE GRAM POSITIVE COCCI     IN PAIRS IN CLUSTERS IN CHAINS     Performed at [facility]   Culture     Final   Value: MULTIPLE ORGANISMS PRESENT, NONE PREDOMINANT     Note: NO STAPHYLOCOCCUS AUREUS ISOLATED NO GROUP A STREP (S.PYOGENES) ISOLATED     Performed at [facility]   Report Status 12/17/2013 FINAL   Final  URINE CULTURE     Status: None   Collection Time    12/14/13  1:21 PM      Result Value Ref Range Status   Specimen Description URINE, CATHETERIZED   Final   Special Requests NONE   Final   Culture  Setup Time     Final   Value: 12/14/2013 19:07     Performed at [place]     Final   Value: >=100,000  COLONIES/ML     Performed at [facility]   Culture     Final   Value: ENTEROCOCCUS SPECIES     METHICILLIN RESISTANT STAPHYLOCOCCUS AUREUS     Note: RIFAMPIN AND GENTAMICIN SHOULD NOT BE USED AS SINGLE DRUGS FOR TREATMENT OF STAPH INFECTIONS. CRITICAL RESULT CALLED TO, READ BACK BY AND VERIFIED WITH: DAWN BROWN 12/18/13 AT 0150 RIDK     Performed at [facility]   Report Status 12/18/2013 FINAL   Final   Organism ID, Bacteria ENTEROCOCCUS SPECIES   Final   Organism ID, Bacteria METHICILLIN RESISTANT STAPHYLOCOCCUS AUREUS   Final  CLOSTRIDIUM DIFFICILE BY PCR     Status: Abnormal   Collection Time    12/15/13  9:24 AM      Result Value Ref Range Status   C difficile by pcr POSITIVE (*) NEGATIVE Final   Comment: CRITICAL RESULT CALLED TO, READ BACK BY AND VERIFIED WITH:     A.ARNOLD,RN [id number] 12/15/13 M.CAMPBELL     Performed at [facility]     Studies: No results found.  Scheduled Meds: . antiseptic oral rinse  7 mL Mouth Rinse BID  . aspirin  325 mg Oral q morning - 10a  . buPROPion  300 mg Oral Daily  . digoxin  0.125 mg Intravenous Daily  . diltiazem  120 mg Oral Daily  . enoxaparin (LOVENOX) injection  40 mg Subcutaneous Q24H  . estradiol  2 mg Oral Daily  . famotidine  40 mg Oral Daily  . fidaxomicin  200 mg Oral BID  . furosemide  40  mg Intravenous 3 times per day  . magnesium oxide  400 mg Oral BID  . metoprolol tartrate  25 mg Oral BID  . multivitamin with minerals  1 tablet Oral Daily  . phosphorus  500 mg Oral BID  . potassium chloride  40 mEq Oral Q6H  . saccharomyces boulardii  250 mg Oral BID  . sucralfate  1 g Oral TID WC & HS   Continuous Infusions: . sodium chloride 10 mL/hr at 12/20/13 1121    Principal Problem:   C. difficile enteritis Active Problems:   Atrial fibrillation with rapid ventricular response   Hypotension   Chronic hyponatremia   CMML (chronic myelomonocytic leukemia)   Open abdominal wall wound   Hypocalcemia    Metabolic acidosis   MRSA carrier   COPD (chronic obstructive pulmonary disease)   Anemia of chronic disease   Leukocytosis, unspecified   Shock   Hypovolemia dehydration   Nausea    Time spent: >30 minutes    [facility] Pager [phone]. If 7PM-7AM, please contact night-coverage at [email or web address], password [facility] 12/21/2013, 11:33 AM  LOS: 7 days

## 2013-12-22 DIAGNOSIS — R059 Cough, unspecified: Secondary | ICD-10-CM

## 2013-12-22 DIAGNOSIS — R05 Cough: Secondary | ICD-10-CM

## 2013-12-22 LAB — RENAL FUNCTION PANEL
ALBUMIN: 1.8 g/dL — AB (ref 3.5–5.2)
Anion gap: 11 (ref 5–15)
BUN: 9 mg/dL (ref 6–23)
CALCIUM: 7.2 mg/dL — AB (ref 8.4–10.5)
CO2: 29 mEq/L (ref 19–32)
CREATININE: 0.74 mg/dL (ref 0.50–1.10)
Chloride: 97 mEq/L (ref 96–112)
GFR calc Af Amer: >90 mL/min (ref >90)
GFR calc non Af Amer: 88 mL/min — ABNORMAL LOW (ref >90)
Glucose, Bld: 109 mg/dL — ABNORMAL HIGH (ref 70–99)
Phosphorus: 3.6 mg/dL (ref 2.3–4.6)
Potassium: 3.6 mEq/L — ABNORMAL LOW (ref 3.7–5.3)
Sodium: 137 mEq/L (ref 137–147)

## 2013-12-22 LAB — CBC
HEMATOCRIT: 19.9 % — AB (ref 36.0–46.0)
HEMATOCRIT: 24 % — AB (ref 36.0–46.0)
Hemoglobin: 6.8 g/dL — CL (ref 12.0–15.0)
Hemoglobin: 8 g/dL — ABNORMAL LOW (ref 12.0–15.0)
MCH: 32.1 pg (ref 26.0–34.0)
MCH: 31.3 pg (ref 26.0–34.0)
MCHC: 34.2 g/dL (ref 30.0–36.0)
MCHC: 33.3 g/dL (ref 30.0–36.0)
MCV: 93.9 fL (ref 78.0–100.0)
MCV: 93.8 fL (ref 78.0–100.0)
PLATELETS: 258 10*3/uL (ref 150–400)
Platelets: 203 10*3/uL (ref 150–400)
RBC: 2.12 MIL/uL — AB (ref 3.87–5.11)
RBC: 2.56 MIL/uL — ABNORMAL LOW (ref 3.87–5.11)
RDW: 19.5 % — ABNORMAL HIGH (ref 11.5–15.5)
RDW: 19.6 % — AB (ref 11.5–15.5)
WBC: 7.9 10*3/uL (ref 4.0–10.5)
WBC: 8 10*3/uL (ref 4.0–10.5)

## 2013-12-22 LAB — MAGNESIUM: Magnesium: 1.7 mg/dL (ref 1.5–2.5)

## 2013-12-22 MED ORDER — FIDAXOMICIN 200 MG PO TABS: 200.0000 mg | ORAL_TABLET | Freq: Two times a day (BID) | ORAL | Status: DC

## 2013-12-22 MED ORDER — FUROSEMIDE 40 MG PO TABS: 40.0000 mg | ORAL_TABLET | Freq: Every day | ORAL | Status: DC

## 2013-12-22 MED ORDER — FAMOTIDINE 40 MG PO TABS: 40.0000 mg | ORAL_TABLET | Freq: Every day | ORAL | Status: DC

## 2013-12-22 MED ORDER — MAGNESIUM OXIDE 400 (241.3 MG) MG PO TABS: 400.0000 mg | ORAL_TABLET | Freq: Two times a day (BID) | ORAL | Status: DC

## 2013-12-22 MED ORDER — POTASSIUM CHLORIDE ER 20 MEQ PO TBCR: 20.0000 meq | EXTENDED_RELEASE_TABLET | Freq: Three times a day (TID) | ORAL | Status: AC

## 2013-12-22 NOTE — Progress Notes (Signed)
CRITICAL VALUE ALERT  Critical value received:  Hgb=6.8  Date of notification:  12/22/13  Time of notification:  04:38am  Critical value read back:Yes.    Nurse who received alert:  Donnald Garre  MD notified (1st page):  Corliss Blacker for [facility] group  Time of first page:  04:52am by text  MD notified (2nd page):  Time of second page:  Responding MD:  Corliss Blacker for [facility] group  Time MD responded:  04:58am(No new orders @ this time, will have rounding MD make decision on transfusion)

## 2013-12-22 NOTE — Discharge Summary (Signed)
Physician Discharge Summary  [name] LKJ:[id number] DOB: 1950-08-18 DOA: 12/14/2013  PCP: Darlin Coco, MD  Admit date: 12/14/2013 Discharge date: 12/22/2013  Time spent: 40 minutes  Recommendations for Outpatient Follow-up:  1. Followup with primary care physician within one week. 2. Check BMP and magnesium level within one week.  Discharge Diagnoses:  Principal Problem:   C. difficile enteritis Active Problems:   Atrial fibrillation with rapid ventricular response   Hypotension   Chronic hyponatremia   CMML (chronic myelomonocytic leukemia)   Open abdominal wall wound   Hypocalcemia   Metabolic acidosis   MRSA carrier   COPD (chronic obstructive pulmonary disease)   Anemia of chronic disease   Leukocytosis, unspecified   Shock   Hypovolemia dehydration   Nausea   Discharge Condition: Stable  Diet recommendation: Heart healthy  Filed Weights   12/17/13 0538 12/17/13 1316 12/22/13 0700  Weight: 67.9 kg (149 lb 11.1 oz) 68.5 kg (151 lb 0.2 oz) 71.8 kg (158 lb 4.6 oz)    History of present illness:  [name] is a 63 y.o. female with a history of Multiple Medical problems including a history of CML, Ischemic Colitis in 01/2012 S/P small bowel resection and Ileostomy placement who presents to the ED with complaints of nausea and ABD Distension and Dizziness worsening over the past week. She has an high output Ostomy and has recurrent hospitalizations for bouts of dehydration and electrolyte derangements. She denies any fevers or chills. She was hospitalized recently for MRSA UTI and was placed on oral doxycycline to continue as an outpatient.   Hospital Course:   Hypovolemia dehydration/Shock/ Hypotension  -Patient presented with creatinine of 1.38, likely to due to High Output Ostomy and c. Diff infection  -Aggressively hydrated with IV fluids.  -Patient required central line for IV fluids and medications at the time of admission.  -With follow clinical  response; if remains stable will transfer out of the unit on 9/1  -ID recommended Dificid for total 14 days. On discharge 8 more days.  Fluid overload  -Patient developed fluid overload likely secondary to aggressive IV fluid hydration.  -IV fluid discontinued, started initially on Lasix IV, on discharge discharge on Lasix 40 mg for 7 more days.  C. Diff enteritis:  -Severe infection based on algorithm was treated with Dificid, was on Flagyl but it was discontinued.  -Florastor added  -avoiding protonix due to C. Diff infection  -patient allergic to vanc  -ID recommended 14 days of deficit and avoid antibiotics as well as PPIs  MRSA/enterococcus infection in urine: appears to be colonization  -patient has received 26 days of zyvox.  -per ID treat just C. Diff and discontinue the rest of other antibiotics.   Atrial fibrillation with rapid ventricular response, transient, resolved no anticoagulation. -Due to #1, severe metabolic derangement is playing a role.  -continue IVFs, metoprolol, cardizem and digoxin, now controlled.  -heart rate in the 90's; who has now converted to sinus rhythm, she is back to her regular home medications.  Hypocalcemia, hypokalemia and hypomagnesemia: due to loss from [employer] and poor nutrition  -Repleted calcium and magnesium parenterally. -On discharge calcium 7.2, once corrected for albumin is 1.8 it will be normal at 8.9. -Electrolytes aggressively corrected, and discharged discharge her magnesium oxide -Patient will be on Lasix (for one week) so potassium 60 mEq per day for the next 7 days.  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  CMML (chronic myelomonocytic leukemia)  -stable  -continue follow up with oncology service as an outpatient  -Hgb stable (7.4); hemodilution playing role   Open abdominal wall  wound  -Will follow Wound care team recommendations  -no drainage or superimposed signs of infection   Metabolic acidosis- Due to High Output Ostomy, and Resolving Infection  -most likely triggered by c. Diff infection and diarrhea  -Was initially on bicarbonate drip, patient on calcium bicarbonate at home. -Bicarbonate drip discontinued, bicarbonate is 29 and the day of discharge.  COPD (chronic obstructive pulmonary disease)  -Monitor O2 Sats and Albuterol Nebs PRN  -prn oxygen supplementation   Anemia of chronic disease -  -Anemia panel shows anemia of chronic disease with low iron.  -status post 1 units of PRBCs on 8/29; Hgb 7.4 (stable and no requiring further transfusion at this moment)  -Hemoglobin of discharge is 8.0. Earlier in the same day hemoglobin was 6.8, turned out that lab personnel drew the CBC from central line which probably diluted blood.   Procedures:  None  Consultations:  PCCM.  Infectious disease  Discharge Exam: Filed Vitals:   12/22/13 0453  BP: 145/83  Pulse: 86  Temp: 98.3 F (36.8 C)  Resp: 19   General: Alert and awake, oriented x3, not in any acute distress. HEENT: anicteric sclera, pupils reactive to light and accommodation, EOMI CVS: S1-S2 clear, no murmur rubs or gallops Chest: clear to auscultation bilaterally, no wheezing, rales or rhonchi Abdomen: soft nontender, nondistended, normal bowel sounds, no organomegaly Extremities: no cyanosis, clubbing or edema noted bilaterally Neuro: Cranial nerves II-XII intact, no focal neurological deficits  Discharge Instructions You were cared for by a hospitalist during your hospital stay. If you have any questions about your discharge medications or the care you received while you were in the hospital after you are discharged, you can call the unit and asked to speak with the hospitalist on call if the hospitalist that took care of you is not available. Once you are discharged, your primary care  physician will handle any further medical issues. Please note that NO REFILLS for any discharge medications will be authorized once you are discharged, as it is imperative that you return to your primary care physician (or establish a relationship with a primary care physician if you do not have one) for your aftercare needs so that they can reassess your need for medications and monitor your lab values.  Discharge Instructions   Diet - low sodium heart healthy    Complete by:  As directed      Increase activity slowly    Complete by:  As directed      Practitioner attestation of consent    Complete by:  As directed   I, the ordering practitioner, attest that I have discussed with the patient the benefits, risks, side effects, alternatives, likelihood of achieving goals and potential problems during recovery for the procedure listed.  Procedure:  Blood Product(s)          Current Discharge Medication List    START taking these medications   Details  famotidine (PEPCID) 40 MG tablet Take 1 tablet (40 mg total) by mouth daily. Qty: 30 tablet, Refills: 0    fidaxomicin (DIFICID) 200 MG TABS tablet Take 1 tablet (200 mg total) by mouth 2 (two) times daily. Qty: 16 tablet, Refills: 0    furosemide (LASIX) 40 MG tablet Take 1 tablet (40 mg total)  by mouth daily. Qty: 7 tablet, Refills: 0    magnesium oxide (MAG-OX) 400 (241.3 MG) MG tablet Take 1 tablet (400 mg total) by mouth 2 (two) times daily. Qty: 60 tablet, Refills: 0    potassium chloride 20 MEQ TBCR Take 20 mEq by mouth 3 (three) times daily. Qty: 21 tablet, Refills: 0      CONTINUE these medications which have NOT CHANGED   Details  albuterol (PROVENTIL HFA;VENTOLIN HFA) 108 (90 BASE) MCG/ACT inhaler Inhale 1-2 puffs into the lungs every 6 (six) hours as needed for wheezing or shortness of breath.     aspirin 325 MG EC tablet Take 325 mg by mouth every morning.    Associated Diagnoses: Vitamin B12 deficiency; CMML (chronic  myelomonocytic leukemia); Anemia    buPROPion (WELLBUTRIN XL) 300 MG 24 hr tablet Take 300 mg by mouth daily before breakfast.     calcium carbonate (TUMS E-X 750) 750 MG chewable tablet Chew 1 tablet (750 mg total) by mouth 3 (three) times daily. Qty: 90 tablet, Refills: 0    cyclobenzaprine (FLEXERIL) 10 MG tablet Take 10 mg by mouth 3 (three) times daily as needed for muscle spasms.     diltiazem (CARDIZEM CD) 120 MG 24 hr capsule Take 1 capsule (120 mg total) by mouth daily. Qty: 30 capsule, Refills: 1    estradiol (ESTRACE) 2 MG tablet Take 2 mg by mouth daily before breakfast.     HYDROmorphone (DILAUDID) 2 MG tablet Take 2-4 mg by mouth every 6 (six) hours as needed for severe pain.    metoprolol (LOPRESSOR) 50 MG tablet Take 1 tablet (50 mg total) by mouth 2 (two) times daily. Qty: 60 tablet, Refills: 1    ondansetron (ZOFRAN) 4 MG tablet Take 4 mg by mouth every 8 (eight) hours as needed for nausea or vomiting.    promethazine (PHENERGAN) 25 MG tablet Take 25 mg by mouth every 6 (six) hours as needed for nausea or vomiting (nausea).    saccharomyces boulardii (FLORASTOR) 250 MG capsule Take 1 capsule (250 mg total) by mouth 2 (two) times daily. Qty: 30 capsule, Refills: `    loratadine-pseudoephedrine (CLARITIN-D 12-HOUR) 5-120 MG per tablet Take 1 tablet by mouth daily as needed for allergies.       STOP taking these medications     doxycycline (VIBRA-TABS) 100 MG tablet      esomeprazole (NEXIUM) 40 MG capsule      feeding supplement, ENSURE COMPLETE, (ENSURE COMPLETE) LIQD        Allergies  Allergen Reactions  . Vancomycin Hives and Rash    wheezing  . Ativan [Lorazepam] Other (See Comments)    "makes me crazy"  . Codeine Nausea And Vomiting  . Tetanus Toxoids Other (See Comments)    Serum reaction  . Penicillins Hives and Rash  . Xarelto [Rivaroxaban] Hives and Rash    (May not be allergic. Vancomycin was also being taken when reaction occurred.)    Follow-up Information   Follow up with Darlin Coco, MD In 1 week.   Specialty:  Cardiology   Contact information:   [facility] [address] [address] [phone]        The results of significant diagnostics from this hospitalization (including imaging, microbiology, ancillary and laboratory) are listed below for reference.    Significant Diagnostic Studies: Dg Chest Port 1 View  12/14/2013   CLINICAL DATA:  Status post central line placed  EXAM: PORTABLE CHEST - 1 VIEW  COMPARISON:  12/14/2013 217 hr  FINDINGS: The left jugular central line is been placed with the catheter tip at the cavoatrial junction. No pneumothorax is noted. Cardiac shadow is stable. The lungs are well aerated. Very minimal right basilar atelectasis is noted. No bony abnormality is seen.  IMPRESSION: No pneumothorax following central line placement.  Minimal right basilar atelectasis.   Electronically Signed   By: Inez Catalina M.D.   On: 12/14/2013 07:42   Dg Chest Port 1 View  12/14/2013   CLINICAL DATA:  Chest pain  EXAM: PORTABLE CHEST - 1 VIEW  COMPARISON:  Prior radiograph from 11/20/2013  FINDINGS: Heart size is stable from prior exam, and remains within normal limits. Tortuosity of the intrathoracic aorta noted.  The lungs are normally inflated. Diffuse prominence of the interstitial markings is stable. No airspace consolidation, pleural effusion, or pulmonary edema is identified. There is no pneumothorax.  No acute osseous abnormality identified.  IMPRESSION: No acute cardiopulmonary abnormality.   Electronically Signed   By: Jeannine Boga M.D.   On: 12/14/2013 02:45   Dg Abd Portable 2v  12/14/2013   CLINICAL DATA:  Abdominal distention.  Dizziness.  Nausea.  EXAM: PORTABLE ABDOMEN - 2 VIEW  COMPARISON:  One-view abdomen 08/24/2013.  FINDINGS: The stent loops of large and small bowel are again seen. Fluid levels are present on the decubitus image. There is no free air. Degenerative  changes are present in the thoracolumbar spine with stable scoliosis.  IMPRESSION: 1. Ten loops of large and small bowel with fluid levels but no obstruction. The finding is most compatible with an adynamic ileus. 2. No free air. 3. Stable degenerative change in scoliosis in the lumbar spine.   Electronically Signed   By: Lawrence Santiago M.D.   On: 12/14/2013 02:39    Microbiology: Recent Results (from the past 240 hour(s))  MRSA PCR SCREENING     Status: Abnormal   Collection Time    12/14/13  5:30 AM      Result Value Ref Range Status   MRSA by PCR POSITIVE (*) NEGATIVE Final   Comment:            The GeneXpert MRSA Assay (FDA     approved for NASAL specimens     only), is one component of a     comprehensive MRSA colonization     surveillance program. It is not     intended to diagnose MRSA     infection nor to guide or     monitor treatment for     MRSA infections.     RESULT CALLED TO, READ BACK BY AND VERIFIED WITH:     Wallene Huh [id number] @ [phone] BY J SCOTTON  CULTURE, BLOOD (ROUTINE X 2)     Status: None   Collection Time    12/14/13  7:55 AM      Result Value Ref Range Status   Specimen Description BLOOD RIGHT HAND   Final   Special Requests BOTTLES DRAWN AEROBIC ONLY 5 CC   Final   Culture  Setup Time     Final   Value: 12/14/2013 15:22     Performed at [facility]   Culture     Final   Value: NO GROWTH 5 DAYS     Performed at [facility]   Report Status 12/20/2013 FINAL   Final  CULTURE, BLOOD (ROUTINE X 2)     Status: None   Collection Time  12/14/13 10:55 AM      Result Value Ref Range Status   Specimen Description BLOOD LEFT PORTA CATH   Final   Special Requests     Final   Value: BOTTLES DRAWN AEROBIC AND ANAEROBIC 10CC IMMUNOCOMPROMISED   Culture  Setup Time     Final   Value: 12/14/2013 18:15     Performed at [facility]   Culture     Final   Value: NO GROWTH 5 DAYS     Performed at [facility]   Report Status  12/20/2013 FINAL   Final  WOUND CULTURE     Status: None   Collection Time    12/14/13 11:15 AM      Result Value Ref Range Status   Specimen Description WOUND ABDOMINAL WOUND   Final   Special Requests NONE   Final   Gram Stain     Final   Value: NO WBC SEEN     NO SQUAMOUS EPITHELIAL CELLS SEEN     MODERATE GRAM POSITIVE COCCI     IN PAIRS IN CLUSTERS IN CHAINS     Performed at [facility]   Culture     Final   Value: MULTIPLE ORGANISMS PRESENT, NONE PREDOMINANT     Note: NO STAPHYLOCOCCUS AUREUS ISOLATED NO GROUP A STREP (S.PYOGENES) ISOLATED     Performed at [facility]   Report Status 12/17/2013 FINAL   Final  URINE CULTURE     Status: None   Collection Time    12/14/13  1:21 PM      Result Value Ref Range Status   Specimen Description URINE, CATHETERIZED   Final   Special Requests NONE   Final   Culture  Setup Time     Final   Value: 12/14/2013 19:07     Performed at [place]     Final   Value: >=100,000 COLONIES/ML     Performed at [facility]   Culture     Final   Value: ENTEROCOCCUS SPECIES     METHICILLIN RESISTANT STAPHYLOCOCCUS AUREUS     Note: RIFAMPIN AND GENTAMICIN SHOULD NOT BE USED AS SINGLE DRUGS FOR TREATMENT OF STAPH INFECTIONS. CRITICAL RESULT CALLED TO, READ BACK BY AND VERIFIED WITH: DAWN BROWN 12/18/13 AT 0150 RIDK     Performed at [facility]   Report Status 12/18/2013 FINAL   Final   Organism ID, Bacteria ENTEROCOCCUS SPECIES   Final   Organism ID, Bacteria METHICILLIN RESISTANT STAPHYLOCOCCUS AUREUS   Final  CLOSTRIDIUM DIFFICILE BY PCR     Status: Abnormal   Collection Time    12/15/13  9:24 AM      Result Value Ref Range Status   C difficile by pcr POSITIVE (*) NEGATIVE Final   Comment: CRITICAL RESULT CALLED TO, READ BACK BY AND VERIFIED WITH:     A.ARNOLD,RN [id number] 12/15/13 M.CAMPBELL     Performed at [place]: Basic Metabolic Panel:  Recent Labs Lab  12/16/13 0545 12/19/13 0410 12/19/13 0411 12/19/13 1500 12/20/13 0357 12/21/13 0415 12/22/13 0409 12/22/13 0410  NA 136*  --  136* 133* 135* 137  --  137  K 3.9  --  2.9* 3.6* 3.4* 4.0  --  3.6*  CL 101  --  93* 92* 93* 96  --  97  CO2 22  --  32 32 31 31  --  29  GLUCOSE  107*  --  93 102* 102* 92  --  109*  BUN 13  --  7 6 7 8   --  9  CREATININE 0.73  --  0.52 0.57 0.59 0.64  --  0.74  CALCIUM 4.5*  --  6.3* 7.2* 8.5 7.8*  --  7.2*  MG 1.2* 1.2*  --   --  1.6 1.3* 1.7  --   PHOS 2.1*  --  3.3  --  3.6 4.1  --  3.6   Liver Function Tests:  Recent Labs Lab 12/19/13 0411 12/20/13 0357 12/21/13 0415 12/22/13 0410  ALBUMIN 1.7* 1.9* 1.8* 1.8*   No results found for this basename: LIPASE, AMYLASE,  in the last 168 hours No results found for this basename: AMMONIA,  in the last 168 hours CBC:  Recent Labs Lab 12/19/13 0410 12/20/13 0357 12/21/13 0800 12/22/13 0409 12/22/13 0746  WBC 7.8 10.4 8.8 7.9 8.0  HGB 7.6* 7.8* 7.4* 6.8* 8.0*  HCT 22.0* 23.1* 21.7* 19.9* 24.0*  MCV 92.1 93.5 93.1 93.9 93.8  PLT 222 236 219 203 258   Cardiac Enzymes: No results found for this basename: CKTOTAL, CKMB, CKMBINDEX, TROPONINI,  in the last 168 hours BNP: BNP (last 3 results)  Recent Labs  03/24/13 2031 03/25/13 0334 03/26/13 0414  PROBNP 9254.0* 9403.0* 4535.0*   CBG: No results found for this basename: GLUCAP,  in the last 168 hours     Signed:  Azarias Chiou A  [facility] 12/22/2013, 10:03 AM

## 2013-12-26 ENCOUNTER — Inpatient Hospital Stay: Payer: BC Managed Care – PPO | Admitting: Emergency Medicine

## 2013-12-31 ENCOUNTER — Other Ambulatory Visit: Payer: BC Managed Care – PPO

## 2013-12-31 ENCOUNTER — Inpatient Hospital Stay: Payer: BC Managed Care – PPO | Admitting: Emergency Medicine

## 2013-12-31 ENCOUNTER — Ambulatory Visit: Payer: BC Managed Care – PPO

## 2014-01-01 ENCOUNTER — Telehealth: Payer: Self-pay | Admitting: Cardiology

## 2014-01-01 NOTE — Telephone Encounter (Signed)
Dr. Mare Ferrari aware

## 2014-01-01 NOTE — Telephone Encounter (Signed)
[name] with [facility] called to report. He called patient to schedule PT appointment, she informed him she didn't want PT at this time.

## 2014-01-02 ENCOUNTER — Telehealth: Payer: Self-pay | Admitting: *Deleted

## 2014-01-02 ENCOUNTER — Encounter: Payer: Self-pay | Admitting: Hematology and Oncology

## 2014-01-02 ENCOUNTER — Ambulatory Visit: Payer: BC Managed Care – PPO | Admitting: Hematology and Oncology

## 2014-01-02 ENCOUNTER — Telehealth: Payer: Self-pay | Admitting: Obstetrics & Gynecology

## 2014-01-02 NOTE — Telephone Encounter (Signed)
per pof to sch pt appt-sent MW email to sch blood tran-will call pt after reply

## 2014-01-02 NOTE — Telephone Encounter (Signed)
Pt states she cannot make it for her appts this morning.  She can't make it until about noon today.  Informed pt we have no availability to see her later today.  She states she has "a lot of fluid onboard" and is trying to get diuresed.  She is having difficulty walking due to the excess fluid.  She would like to r/s her appt to Monday or Thursday next week.

## 2014-01-02 NOTE — Telephone Encounter (Signed)
Monday add on 1015 to see me with labs and transfusion for 2 units

## 2014-01-02 NOTE — Telephone Encounter (Signed)
Per staff message and POF I have scheduled appts. Advised scheduler of appts. JMW  

## 2014-01-03 ENCOUNTER — Telehealth: Payer: Self-pay | Admitting: *Deleted

## 2014-01-03 NOTE — Telephone Encounter (Signed)
Pt called to see if appts have been made for next week.  Informed pt of appts on Monday 9/21 starting at 9:45 am.  She verbalized understanding.

## 2014-01-06 ENCOUNTER — Other Ambulatory Visit (HOSPITAL_BASED_OUTPATIENT_CLINIC_OR_DEPARTMENT_OTHER): Payer: BC Managed Care – PPO

## 2014-01-06 ENCOUNTER — Telehealth: Payer: Self-pay | Admitting: Hematology and Oncology

## 2014-01-06 ENCOUNTER — Encounter: Payer: Self-pay | Admitting: Hematology and Oncology

## 2014-01-06 ENCOUNTER — Ambulatory Visit (HOSPITAL_COMMUNITY)
Admission: RE | Admit: 2014-01-06 | Discharge: 2014-01-06 | Disposition: A | Discharge status: Home / Self Care | Payer: BC Managed Care – PPO | Source: Ambulatory Visit | Attending: Hematology and Oncology | Admitting: Hematology and Oncology

## 2014-01-06 ENCOUNTER — Other Ambulatory Visit: Payer: Self-pay | Admitting: *Deleted

## 2014-01-06 ENCOUNTER — Ambulatory Visit (HOSPITAL_BASED_OUTPATIENT_CLINIC_OR_DEPARTMENT_OTHER): Payer: BC Managed Care – PPO | Admitting: Hematology and Oncology

## 2014-01-06 VITALS — BP 115/80 | HR 93 | Temp 97.5°F | Resp 18 | Ht 65.0 in | Wt 129.6 lb

## 2014-01-06 DIAGNOSIS — C9210 Chronic myeloid leukemia, BCR/ABL-positive, not having achieved remission: Secondary | ICD-10-CM | POA: Diagnosis not present

## 2014-01-06 DIAGNOSIS — D630 Anemia in neoplastic disease: Secondary | ICD-10-CM

## 2014-01-06 DIAGNOSIS — C9310 Chronic myelomonocytic leukemia not having achieved remission: Secondary | ICD-10-CM

## 2014-01-06 DIAGNOSIS — Z79899 Other long term (current) drug therapy: Secondary | ICD-10-CM | POA: Diagnosis not present

## 2014-01-06 DIAGNOSIS — D638 Anemia in other chronic diseases classified elsewhere: Secondary | ICD-10-CM

## 2014-01-06 DIAGNOSIS — A0472 Enterocolitis due to Clostridium difficile, not specified as recurrent: Secondary | ICD-10-CM | POA: Insufficient documentation

## 2014-01-06 LAB — CBC & DIFF AND RETIC
BASO%: 0.4 % (ref 0.0–2.0)
Basophils Absolute: 0 10*3/uL (ref 0.0–0.1)
EOS%: 0.6 % (ref 0.0–7.0)
Eosinophils Absolute: 0 10*3/uL (ref 0.0–0.5)
HCT: 23.8 % — ABNORMAL LOW (ref 34.8–46.6)
HGB: 7.5 g/dL — ABNORMAL LOW (ref 11.6–15.9)
Immature Retic Fract: 12.7 % — ABNORMAL HIGH (ref 1.60–10.00)
LYMPH%: 28.1 % (ref 14.0–49.7)
MCH: 32.1 pg (ref 25.1–34.0)
MCHC: 31.5 g/dL (ref 31.5–36.0)
MCV: 101.7 fL — AB (ref 79.5–101.0)
MONO#: 1.8 10*3/uL — AB (ref 0.1–0.9)
MONO%: 33.8 % — AB (ref 0.0–14.0)
NEUT%: 37.1 % — ABNORMAL LOW (ref 38.4–76.8)
NEUTROS ABS: 2 10*3/uL (ref 1.5–6.5)
Platelets: 401 10*3/uL — ABNORMAL HIGH (ref 145–400)
RBC: 2.34 10*6/uL — ABNORMAL LOW (ref 3.70–5.45)
RDW: 25.1 % — AB (ref 11.2–14.5)
RETIC %: 4.55 % — AB (ref 0.70–2.10)
RETIC CT ABS: 106.47 10*3/uL — AB (ref 33.70–90.70)
WBC: 5.2 10*3/uL (ref 3.9–10.3)
lymph#: 1.5 10*3/uL (ref 0.9–3.3)

## 2014-01-06 LAB — HOLD TUBE, BLOOD BANK

## 2014-01-06 LAB — TECHNOLOGIST REVIEW

## 2014-01-06 NOTE — Assessment & Plan Note (Signed)
I discussed with her the risks, benefits and side effects of erythropoietin stimulating agents for treatment of anemia. She agreed to proceed. We have tried Aranesp as 500 mcg every other week with goal of hemoglobin greater than 10 and to reduce transfusion requirements. Patient felt unwell while on Aranesp and wanted to discontinue her injections. I recommend she revisit this issue again because I do not believe that was the cause on her illness.

## 2014-01-06 NOTE — Progress Notes (Signed)
[place] OFFICE PROGRESS NOTE  Patient Care Team: Darlin Coco, MD as PCP - General (Cardiology) Winfield Cunas., MD (Gastroenterology) Garald Balding, MD (Orthopedic Surgery) Jackolyn Confer, MD as Attending Physician (General Surgery) Heath Lark, MD as Consulting Physician (Hematology and Oncology)  SUMMARY OF ONCOLOGIC HISTORY: The patient had recurrent ischemic colitis in 2013. She has significant surgery, sepsis, recurrent hospitalization including intensive care, recurrent blood transfusion, finally was referred to see a hematologist for evaluation. She had extensive workup and subsequently was found to have B12 deficiency along with the diagnosis of CMML. She had a bone marrow aspirate and biopsy which was not grossly abnormal but suggestive of possible diagnosis of CMML due to mild dyspoiesis with mildly elevated monocyte count. She was being observed. Since she was last seen here, she had repeated admission to the hospital for recurrent infection. She was transfused intermittently.  INTERVAL HISTORY: Please see below for problem oriented charting. She was recently discharged from the hospital with C. difficile aenteritis. Her nausea has resolved. She complains of feeling weak.  REVIEW OF SYSTEMS:   Constitutional: Denies fevers, chills or abnormal weight loss Eyes: Denies blurriness of vision Ears, nose, mouth, throat, and face: Denies mucositis or sore throat Respiratory: Denies cough, dyspnea or wheezes Cardiovascular: Denies palpitation, chest discomfort or lower extremity swelling Skin: Denies abnormal skin rashes Lymphatics: Denies new lymphadenopathy or easy bruising Neurological:Denies numbness, tingling or new weaknesses Behavioral/Psych: Mood is stable, no new changes  All other systems were reviewed with the patient and are negative.  I have reviewed the past medical history, past surgical history, social history and family history with  the patient and they are unchanged from previous note.  ALLERGIES:  is allergic to vancomycin; ativan; codeine; tetanus toxoids; penicillins; and xarelto.  MEDICATIONS:  Current Outpatient Prescriptions  Medication Sig Dispense Refill  . albuterol (PROVENTIL HFA;VENTOLIN HFA) 108 (90 BASE) MCG/ACT inhaler Inhale 1-2 puffs into the lungs every 6 (six) hours as needed for wheezing or shortness of breath.       Marland Kitchen aspirin 325 MG EC tablet Take 325 mg by mouth every morning.       Marland Kitchen buPROPion (WELLBUTRIN XL) 300 MG 24 hr tablet Take 300 mg by mouth daily before breakfast.       . calcium carbonate (TUMS E-X 750) 750 MG chewable tablet Chew 1 tablet (750 mg total) by mouth 3 (three) times daily.  90 tablet  0  . celecoxib (CELEBREX) 200 MG capsule       . cyclobenzaprine (FLEXERIL) 10 MG tablet Take 10 mg by mouth 3 (three) times daily as needed for muscle spasms.       Marland Kitchen diltiazem (CARDIZEM CD) 120 MG 24 hr capsule Take 1 capsule (120 mg total) by mouth daily.  30 capsule  1  . diphenoxylate-atropine (LOMOTIL) 2.5-0.025 MG per tablet       . estradiol (ESTRACE) 2 MG tablet Take 2 mg by mouth daily before breakfast.       . famotidine (PEPCID) 40 MG tablet Take 1 tablet (40 mg total) by mouth daily.  30 tablet  0  . fidaxomicin (DIFICID) 200 MG TABS tablet Take 1 tablet (200 mg total) by mouth 2 (two) times daily.  16 tablet  0  . furosemide (LASIX) 40 MG tablet Take 1 tablet (40 mg total) by mouth daily.  7 tablet  0  . HYDROcodone-acetaminophen (NORCO) 10-325 MG per tablet       . loratadine-pseudoephedrine (  CLARITIN-D 12-HOUR) 5-120 MG per tablet Take 1 tablet by mouth daily as needed for allergies.       . magnesium oxide (MAG-OX) 400 (241.3 MG) MG tablet Take 1 tablet (400 mg total) by mouth 2 (two) times daily.  60 tablet  0  . metoprolol (LOPRESSOR) 50 MG tablet Take 1 tablet (50 mg total) by mouth 2 (two) times daily.  60 tablet  1  . NEXIUM 40 MG capsule       . ondansetron (ZOFRAN) 4 MG  tablet Take 4 mg by mouth every 8 (eight) hours as needed for nausea or vomiting.      . potassium chloride 20 MEQ TBCR Take 20 mEq by mouth 3 (three) times daily.  21 tablet  0  . promethazine (PHENERGAN) 25 MG tablet Take 25 mg by mouth every 6 (six) hours as needed for nausea or vomiting (nausea).      Marland Kitchen HYDROmorphone (DILAUDID) 2 MG tablet Take 2-4 mg by mouth every 6 (six) hours as needed for severe pain.      Marland Kitchen saccharomyces boulardii (FLORASTOR) 250 MG capsule Take 1 capsule (250 mg total) by mouth 2 (two) times daily.  30 capsule  `   No current facility-administered medications for this visit.    PHYSICAL EXAMINATION: ECOG PERFORMANCE STATUS: 2 - Symptomatic, <50% confined to bed  Filed Vitals:   01/06/14 1007  BP: 115/80  Pulse: 93  Temp: 97.5 F (36.4 C)  Resp: 18   Filed Weights   01/06/14 1007  Weight: 129 lb 9.6 oz (58.786 kg)    GENERAL:alert, no distress and comfortable SKIN: skin color, texture, turgor are normal, no rashes or significant lesions EYES: normal, Conjunctiva are pale and non-injected, sclera clear OROPHARYNX:no exudate, no erythema and lips, buccal mucosa, and tongue normal  NECK: supple, thyroid normal size, non-tender, without nodularity LYMPH:  no palpable lymphadenopathy in the cervical, axillary or inguinal LUNGS: clear to auscultation and percussion with normal breathing effort HEART: regular rate & rhythm and no murmurs and no lower extremity edema ABDOMEN:abdomen soft, non-tender and normal bowel sounds; the ileostomy site looks okay Musculoskeletal:no cyanosis of digits and no clubbing  NEURO: alert & oriented x 3 with fluent speech, no focal motor/sensory deficits  LABORATORY DATA:  I have reviewed the data as listed    Component Value Date/Time   NA 137 12/22/2013 0410   NA 133* 08/28/2013 1211   K 3.6* 12/22/2013 0410   K 3.7 08/28/2013 1211   CL 97 12/22/2013 0410   CL 101 02/09/2012 1321   CO2 29 12/22/2013 0410   CO2 16* 08/28/2013  1211   GLUCOSE 109* 12/22/2013 0410   GLUCOSE 79 08/28/2013 1211   GLUCOSE 79 02/09/2012 1321   BUN 9 12/22/2013 0410   BUN 8.8 08/28/2013 1211   CREATININE 0.74 12/22/2013 0410   CREATININE 0.8 08/28/2013 1211   CREATININE 0.92 08/10/2012 1528   CALCIUM 7.2* 12/22/2013 0410   CALCIUM 5.3* 11/19/2013 1000   CALCIUM 6.7* 08/28/2013 1211   PROT 5.7* 12/14/2013 0215   PROT 6.9 08/28/2013 1211   ALBUMIN 1.8* 12/22/2013 0410   ALBUMIN 2.4* 08/28/2013 1211   AST 17 12/14/2013 0215   AST 19 08/28/2013 1211   ALT 10 12/14/2013 0215   ALT 14 08/28/2013 1211   ALKPHOS 306* 12/14/2013 0215   ALKPHOS 495* 08/28/2013 1211   BILITOT 0.3 12/14/2013 0215   BILITOT 0.37 08/28/2013 1211   GFRNONAA 88* 12/22/2013 0410   GFRAA >90  12/22/2013 0410    No results found for this basename: SPEP,  UPEP,   kappa and lambda light chains    Lab Results  Component Value Date   WBC 5.2 01/06/2014   NEUTROABS 2.0 01/06/2014   HGB 7.5* 01/06/2014   HCT 23.8* 01/06/2014   MCV 101.7* 01/06/2014   PLT 401* 01/06/2014      Chemistry      Component Value Date/Time   NA 137 12/22/2013 0410   NA 133* 08/28/2013 1211   K 3.6* 12/22/2013 0410   K 3.7 08/28/2013 1211   CL 97 12/22/2013 0410   CL 101 02/09/2012 1321   CO2 29 12/22/2013 0410   CO2 16* 08/28/2013 1211   BUN 9 12/22/2013 0410   BUN 8.8 08/28/2013 1211   CREATININE 0.74 12/22/2013 0410   CREATININE 0.8 08/28/2013 1211   CREATININE 0.92 08/10/2012 1528      Component Value Date/Time   CALCIUM 7.2* 12/22/2013 0410   CALCIUM 5.3* 11/19/2013 1000   CALCIUM 6.7* 08/28/2013 1211   ALKPHOS 306* 12/14/2013 0215   ALKPHOS 495* 08/28/2013 1211   AST 17 12/14/2013 0215   AST 19 08/28/2013 1211   ALT 10 12/14/2013 0215   ALT 14 08/28/2013 1211   BILITOT 0.3 12/14/2013 0215   BILITOT 0.37 08/28/2013 1211      ASSESSMENT & PLAN:  CMML (chronic myelomonocytic leukemia) I discussed with her the risks, benefits and side effects of erythropoietin stimulating agents for treatment of anemia. She agreed to  proceed. We have tried Aranesp as 500 mcg every other week with goal of hemoglobin greater than 10 and to reduce transfusion requirements. Patient felt unwell while on Aranesp and wanted to discontinue her injections. I recommend she revisit this issue again because I do not believe that was the cause on her illness.    Anemia of other chronic disease We discussed some of the risks, benefits, and alternatives of blood transfusions. The patient is symptomatic from anemia and the hemoglobin level is critically low.  Some of the side-effects to be expected including risks of transfusion reactions, chills, infection, syndrome of volume overload and risk of hospitalization from various reasons and the patient is willing to proceed and went ahead to sign consent today. I recommend 2 units of blood transfusion when her hemoglobin dropped to less than 8 g. Due to significantly antibodies, I will pursue transfusion tomorrow.   C. difficile enteritis She will continue taking antimicrobial treatment as prescribed.    No orders of the defined types were placed in this encounter.   All questions were answered. The patient knows to call the clinic with any problems, questions or concerns. No barriers to learning was detected. I spent 25 minutes counseling the patient face to face. The total time spent in the appointment was 30 minutes and more than 50% was on counseling and review of test results     [facility], [place], MD 01/06/2014 8:44 PM

## 2014-01-06 NOTE — Assessment & Plan Note (Signed)
We discussed some of the risks, benefits, and alternatives of blood transfusions. The patient is symptomatic from anemia and the hemoglobin level is critically low.  Some of the side-effects to be expected including risks of transfusion reactions, chills, infection, syndrome of volume overload and risk of hospitalization from various reasons and the patient is willing to proceed and went ahead to sign consent today. I recommend 2 units of blood transfusion when her hemoglobin dropped to less than 8 g. Due to significantly antibodies, I will pursue transfusion tomorrow.

## 2014-01-06 NOTE — Telephone Encounter (Signed)
gv adn rpitned appt sched and avs for pt for SEpt OCT and NOV.Marland KitchenMarland KitchenMarland KitchenPer md blood needed to be moved to 9.22

## 2014-01-06 NOTE — Assessment & Plan Note (Signed)
She will continue taking antimicrobial treatment as prescribed.

## 2014-01-07 ENCOUNTER — Other Ambulatory Visit: Payer: Self-pay | Admitting: *Deleted

## 2014-01-07 ENCOUNTER — Ambulatory Visit (HOSPITAL_BASED_OUTPATIENT_CLINIC_OR_DEPARTMENT_OTHER): Payer: BC Managed Care – PPO

## 2014-01-07 VITALS — BP 164/86 | HR 89 | Temp 97.9°F | Resp 20

## 2014-01-07 DIAGNOSIS — D630 Anemia in neoplastic disease: Secondary | ICD-10-CM

## 2014-01-07 DIAGNOSIS — D638 Anemia in other chronic diseases classified elsewhere: Secondary | ICD-10-CM

## 2014-01-07 LAB — PREPARE RBC (CROSSMATCH)

## 2014-01-07 MED ORDER — DIPHENHYDRAMINE HCL 25 MG PO CAPS
25.0000 mg | ORAL_CAPSULE | Freq: Once | ORAL | Status: AC
  Administered 2014-01-07: 25 mg via ORAL

## 2014-01-07 MED ORDER — DIPHENHYDRAMINE HCL 25 MG PO CAPS
ORAL_CAPSULE | ORAL | Status: AC
  Filled 2014-01-07: qty 1

## 2014-01-07 MED ORDER — SODIUM CHLORIDE 0.9 % IV SOLN
250.0000 mL | Freq: Once | INTRAVENOUS | Status: AC
  Administered 2014-01-07: 250 mL via INTRAVENOUS

## 2014-01-07 MED ORDER — ACETAMINOPHEN 325 MG PO TABS
ORAL_TABLET | ORAL | Status: AC
  Filled 2014-01-07: qty 2

## 2014-01-07 MED ORDER — ACETAMINOPHEN 325 MG PO TABS
650.0000 mg | ORAL_TABLET | Freq: Once | ORAL | Status: AC
  Administered 2014-01-07: 650 mg via ORAL

## 2014-01-07 NOTE — Patient Instructions (Signed)

## 2014-01-08 LAB — TYPE AND SCREEN
ABO/RH(D): A POS
Antibody Screen: NEGATIVE
DONOR AG TYPE: NEGATIVE
Donor AG Type: NEGATIVE
UNIT DIVISION: 0
Unit division: 0

## 2014-01-09 ENCOUNTER — Other Ambulatory Visit: Payer: Self-pay | Admitting: *Deleted

## 2014-01-09 ENCOUNTER — Telehealth: Payer: Self-pay | Admitting: Hematology and Oncology

## 2014-01-09 NOTE — Telephone Encounter (Signed)
, °

## 2014-01-10 ENCOUNTER — Inpatient Hospital Stay: Payer: BC Managed Care – PPO | Admitting: Emergency Medicine

## 2014-01-14 ENCOUNTER — Other Ambulatory Visit: Payer: BC Managed Care – PPO

## 2014-01-14 ENCOUNTER — Ambulatory Visit: Payer: BC Managed Care – PPO

## 2014-01-17 ENCOUNTER — Ambulatory Visit (INDEPENDENT_AMBULATORY_CARE_PROVIDER_SITE_OTHER): Payer: BC Managed Care – PPO | Admitting: Cardiology

## 2014-01-17 ENCOUNTER — Encounter: Payer: Self-pay | Admitting: Cardiology

## 2014-01-17 VITALS — BP 132/84 | HR 97 | Ht 65.0 in | Wt 118.0 lb

## 2014-01-17 DIAGNOSIS — I959 Hypotension, unspecified: Secondary | ICD-10-CM

## 2014-01-17 DIAGNOSIS — A047 Enterocolitis due to Clostridium difficile: Secondary | ICD-10-CM

## 2014-01-17 DIAGNOSIS — I4891 Unspecified atrial fibrillation: Secondary | ICD-10-CM

## 2014-01-17 DIAGNOSIS — I480 Paroxysmal atrial fibrillation: Secondary | ICD-10-CM

## 2014-01-17 DIAGNOSIS — I119 Hypertensive heart disease without heart failure: Secondary | ICD-10-CM

## 2014-01-17 DIAGNOSIS — C9310 Chronic myelomonocytic leukemia not having achieved remission: Secondary | ICD-10-CM

## 2014-01-17 DIAGNOSIS — A0472 Enterocolitis due to Clostridium difficile, not specified as recurrent: Secondary | ICD-10-CM

## 2014-01-17 MED ORDER — DILTIAZEM HCL ER COATED BEADS 120 MG PO CP24: 120.0000 mg | ORAL_CAPSULE | Freq: Every day | ORAL | Status: DC

## 2014-01-17 NOTE — Assessment & Plan Note (Signed)
During her last hospitalization she was found to have C. difficile enteritis.  She is allergic to vancomycin and so she had to be treated with Dificid.

## 2014-01-17 NOTE — Assessment & Plan Note (Signed)
The patient has required frequent blood transfusions for her recurring anemia.  She had 2 units of packed cells transfused earlier this week.

## 2014-01-17 NOTE — Patient Instructions (Signed)
Your physician recommends that you continue on your current medications as directed. Please refer to the Current Medication list given to you today.  Your physician wants you to follow-up in: 3 month ov/ekg You will receive a reminder letter in the mail two months in advance. If you don't receive a letter, please call our office to schedule the follow-up appointment.

## 2014-01-17 NOTE — Assessment & Plan Note (Signed)
Blood pressure has been remaining stable on current medication.  She had had a previous history of severe hypertension.  Blood pressure control has improved since she has lost considerable weight.  Since last visit she has lost 21 pounds.

## 2014-01-17 NOTE — Assessment & Plan Note (Addendum)
The patient has a history of going into atrial fibrillation with rapid ventricular response has a secondary phenomenon when she is admitted with severe problems of dehydration and electrolyte derangement.  On her most recent hospitalization she was in atrial fibrillation.  Once her underlying electrolyte depletion is corrected she will normally convert back to normal sinus rhythm.  She is in normal sinus rhythm today.  She has been taking diltiazem CD 120 mg daily and Zebeta 5 mg daily.  She has not had any thromboembolic symptoms.  She is on aspirin.  She is not on full Coumadin anticoagulation because of multiple comorbidities and severe anemia requiring frequent transfusions.

## 2014-01-17 NOTE — Progress Notes (Signed)
[name] Date of Birth:  1950/10/24 [facility] [address] 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 In early 2013 she was hospitalized on the medical service after presenting with sudden onset of severe diarrhea associated with syncope and hypotension. During the course of that hospitalization she underwent colonoscopy which showed ischemic colitis. Also during that hospital stay she also had episodes of paroxysmal atrial fibrillation which responded to beta blocker. During that hospital stay she had an echocardiogram showing normal LV systolic function and normal left atrial size. Because of her past history of anemia and multiple comorbidities she was not placed on warfarin. She has a past history of small bowel obstruction. Her surgeon is Dr. Zella Richer.  She has multiple medical issues which are as outlined below. These include PAF, ischemic colitis, depression, HTN, and tobacco abuse. She has a normal EF per echo from earlier this year.  In September 2013 she was admitted at [facility] with another attack of ischemic colitis. She ended up having colon surgery with a colostomy placed. She had PAF while hospitalized. Also with MRSA from a PICC line.  She subsequently had exploratory abdominal surgery here in [place] and had a prolonged post hospital course. Dr. Zella Richer is her surgeon During her hospitalization the patient had paroxysmal atrial fibrillation with rapid ventricular response. She has had several recent admissions to [facility].  Her most recent admission was 12/14/13 until 12/22/13 she was admitted with C. difficile enterocolitis.  Since discharge she has been followed closely by oncology and has required blood transfusions for recurring severe anemia.  Current Outpatient Prescriptions    Medication Sig Dispense Refill  . albuterol (PROVENTIL HFA;VENTOLIN HFA) 108 (90 BASE) MCG/ACT inhaler Inhale 1-2 puffs into the lungs every 6 (six) hours as needed for wheezing or shortness of breath.       Marland Kitchen aspirin 325 MG EC tablet Take 325 mg by mouth every morning.       . bisoprolol (ZEBETA) 5 MG tablet Take 5 mg by mouth daily.      Marland Kitchen buPROPion (WELLBUTRIN XL) 300 MG 24 hr tablet Take 300 mg by mouth daily before breakfast.       . calcium carbonate (TUMS E-X 750) 750 MG chewable tablet Chew 1 tablet (750 mg total) by mouth 3 (three) times daily.  90 tablet  0  . celecoxib (CELEBREX) 200 MG capsule       . cyclobenzaprine (FLEXERIL) 10 MG tablet Take 10 mg by mouth 3 (three) times daily as needed for muscle spasms.       Marland Kitchen diltiazem (CARDIZEM CD) 120 MG 24 hr capsule Take 1 capsule (120 mg total) by mouth daily.  90 capsule  1  . diphenoxylate-atropine (LOMOTIL) 2.5-0.025 MG per tablet       . estradiol (ESTRACE) 2 MG tablet Take 2 mg by mouth daily before breakfast.       . famotidine (PEPCID) 40 MG tablet Take 1 tablet (40 mg total) by mouth daily.  30 tablet  0  . furosemide (LASIX) 40 MG tablet Take 1 tablet (40 mg total) by mouth daily.  7 tablet  0  . HYDROcodone-acetaminophen (NORCO) 10-325 MG per tablet       . loratadine-pseudoephedrine (CLARITIN-D 12-HOUR) 5-120 MG per tablet Take 1 tablet by  mouth daily as needed for allergies.       . magnesium oxide (MAG-OX) 400 (241.3 MG) MG tablet Take 1 tablet (400 mg total) by mouth 2 (two) times daily.  60 tablet  0  . NEXIUM 40 MG capsule       . ondansetron (ZOFRAN) 4 MG tablet Take 4 mg by mouth every 8 (eight) hours as needed for nausea or vomiting.      . potassium chloride 20 MEQ TBCR Take 20 mEq by mouth 3 (three) times daily.  21 tablet  0  . promethazine (PHENERGAN) 25 MG tablet Take 25 mg by mouth every 6 (six) hours as needed for nausea or vomiting (nausea).      . saccharomyces boulardii (FLORASTOR) 250 MG capsule Take 1  capsule (250 mg total) by mouth 2 (two) times daily.  30 capsule  `  . fidaxomicin (DIFICID) 200 MG TABS tablet Take 1 tablet (200 mg total) by mouth 2 (two) times daily.  16 tablet  0  . HYDROmorphone (DILAUDID) 2 MG tablet Take 2-4 mg by mouth every 6 (six) hours as needed for severe pain.       No current facility-administered medications for this visit.    Allergies  Allergen Reactions  . Vancomycin Hives and Rash    wheezing  . Ativan [Lorazepam] Other (See Comments)    "makes me crazy"  . Codeine Nausea And Vomiting  . Tetanus Toxoids Other (See Comments)    Serum reaction  . Penicillins Hives and Rash  . Xarelto [Rivaroxaban] Hives and Rash    (May not be allergic. Vancomycin was also being taken when reaction occurred.)    Patient Active Problem List   Diagnosis Date Noted  . C. difficile enteritis 12/17/2013  . Shock 12/14/2013  . Hypovolemia dehydration 12/14/2013  . Nausea 12/14/2013  . Protein-calorie malnutrition, severe 11/20/2013  . UTI (lower urinary tract infection) 11/19/2013  . Weakness 11/19/2013  . UTI (urinary tract infection) 11/19/2013  . Acute respiratory distress 11/19/2013  . Severe sepsis 11/19/2013  . ARF (acute renal failure) 11/19/2013  . Urinary retention 11/19/2013  . CAP (community acquired pneumonia) 11/19/2013  . Leukocytosis, unspecified 11/19/2013  . Anemia of chronic disease 10/18/2013  . Unspecified vitamin D deficiency 10/18/2013  . Otitis externa 09/26/2013  . COPD (chronic obstructive pulmonary disease) 09/26/2013  . Tobacco abuse 09/26/2013  . Dyspnea 08/22/2013  . Hyperkalemia 08/22/2013  . Metabolic acidosis 16/01/9603  . 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 Pneumonia, multilobar vs. pulmonary edema 08/22/2013  . Symptomatic anemia 08/21/2013  . 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 Febrile illness 03/24/2013  . B12 deficiency 02/27/2013  . Anemia of other chronic disease 01/28/2013  . Influenza with  pneumonia 01/11/2013  . Obstructive chronic bronchitis with exacerbation 01/06/2013  . Cough 11/19/2012  . Hypocalcemia 07/17/2012  . Hypomagnesemia 06/27/2012  . Open abdominal wall wound 06/20/2012  . Ileostomy status 06/20/2012  . Elevated liver function tests-chronic 05/17/2012  . Ischemic colitis 01/31/2012  . 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 Osteoarthritis of hip 10/27/2011  . Postoperative anemia due to acute blood loss 10/27/2011  . Chronic hyponatremia 10/27/2011  . Ileus 10/27/2011  . Partial small bowel obstruction 07/05/2011  . Atrial fibrillation 07/05/2011  . Chronic ischemic colitis 07/05/2011  . Hypotension 07/05/2011    Class: Acute  . Atrial fibrillation with rapid ventricular response 05/09/2011  . Chronic back pain 05/08/2011  . Diarrhea 05/07/2011  . Dehydration 05/07/2011  .  Benign hypertensive heart disease without heart failure 12/15/2010  . Pure hypercholesterolemia 12/15/2010    History  Smoking status  . Former Smoker -- 0.25 packs/day for 20 years  . Types: Cigarettes  . Quit date: 03/21/2013  Smokeless tobacco  . Never Used    History  Alcohol Use No    Comment: 1 - 2 glasses of wine per week    Family History  Problem Relation Age of Onset  . Stroke Father   . Atrial fibrillation Father   . Hypertension Mother     Review of Systems: Constitutional: no fever chills diaphoresis or fatigue or change in weight.  Head and neck: no hearing loss, no epistaxis, no photophobia or visual disturbance. Respiratory: No cough, shortness of breath or wheezing. Cardiovascular: No chest pain peripheral edema, palpitations. Gastrointestinal: No abdominal distention, no abdominal pain, no change in bowel habits hematochezia or melena. Genitourinary: No dysuria, no frequency, no urgency, no nocturia. Musculoskeletal:No arthralgias, no back pain, no gait disturbance or myalgias. Neurological: No dizziness, no headaches, no numbness,  no seizures, no syncope, no weakness, no tremors. Hematologic: No lymphadenopathy, no easy bruising. Psychiatric: No confusion, no hallucinations, no sleep disturbance.    Physical Exam: Filed Vitals:   01/17/14 1442  BP: 132/84  Pulse: 97  The patient appears to be in no distress.  The patient has lost significant weight and appears to be chronically ill.  Her complexion is sallow  Head and neck exam reveals that the pupils are equal and reactive.  The extraocular movements are full.  There is no scleral icterus.  Mouth and pharynx are benign.  No lymphadenopathy.  No carotid bruits.  The jugular venous pressure is normal.  Thyroid is not enlarged or tender.  Chest is clear to percussion and auscultation.  No rales or rhonchi.  Expansion of the chest is symmetrical.  Heart reveals no abnormal lift or heave.  First and second heart sounds are normal.  There is no murmur gallop rub or click.  Heart rhythm is regular in normal sinus rhythm  The abdomen is soft and nontender.  Bowel sounds are normoactive.  There is no hepatosplenomegaly or mass.  There are no abdominal bruits.  There is a bandage over her midline abdominal incision.  She has an ileostomy in the right upper quadrant area.  Extremities reveal trace pretibial edema probably nutritional.  Neurologic exam is normal strength and no lateralizing weakness.  No sensory deficits.  Integument reveals no rash  EKG showed normal sinus rhythm with left anterior fascicular block  Assessment / Plan: Active Problems:  Paroxysmal atrial fibrillation, resolved.  She had an echocardiogram on 11/19/13 showing ejection fraction 60-65%.  We will continue current dose of diltiazem CD 120 mg daily and Zebeta 5 mg daily Hypotension, resolved  Chronic hyponatremia  CMML (chronic myelomonocytic leukemia) with anemia Open abdominal wall wound  Hypocalcemia  Metabolic acidosis  MRSA carrier  COPD (chronic obstructive pulmonary disease)  Anemia  of chronic disease  Leukocytosis, unspecified  Shock  Hypovolemia dehydration  Nausea, resolved   Disposition: Continue same medication.  Return in 3 months for followup office visit and EKG.

## 2014-01-22 ENCOUNTER — Other Ambulatory Visit: Payer: Self-pay | Admitting: Cardiology

## 2014-01-27 ENCOUNTER — Other Ambulatory Visit (HOSPITAL_BASED_OUTPATIENT_CLINIC_OR_DEPARTMENT_OTHER): Payer: BC Managed Care – PPO

## 2014-01-27 ENCOUNTER — Ambulatory Visit (HOSPITAL_BASED_OUTPATIENT_CLINIC_OR_DEPARTMENT_OTHER): Payer: BC Managed Care – PPO

## 2014-01-27 ENCOUNTER — Other Ambulatory Visit: Payer: Self-pay | Admitting: Hematology and Oncology

## 2014-01-27 VITALS — BP 136/80 | HR 112 | Temp 97.5°F

## 2014-01-27 DIAGNOSIS — D638 Anemia in other chronic diseases classified elsewhere: Secondary | ICD-10-CM

## 2014-01-27 DIAGNOSIS — Z23 Encounter for immunization: Secondary | ICD-10-CM

## 2014-01-27 DIAGNOSIS — E860 Dehydration: Secondary | ICD-10-CM

## 2014-01-27 DIAGNOSIS — Z932 Ileostomy status: Secondary | ICD-10-CM

## 2014-01-27 DIAGNOSIS — Z299 Encounter for prophylactic measures, unspecified: Secondary | ICD-10-CM

## 2014-01-27 DIAGNOSIS — C9310 Chronic myelomonocytic leukemia not having achieved remission: Secondary | ICD-10-CM

## 2014-01-27 LAB — CBC & DIFF AND RETIC
BASO%: 0.3 % (ref 0.0–2.0)
Basophils Absolute: 0 10*3/uL (ref 0.0–0.1)
EOS%: 1.4 % (ref 0.0–7.0)
Eosinophils Absolute: 0.1 10*3/uL (ref 0.0–0.5)
HCT: 28.6 % — ABNORMAL LOW (ref 34.8–46.6)
HGB: 9.4 g/dL — ABNORMAL LOW (ref 11.6–15.9)
IMMATURE RETIC FRACT: 6.7 % (ref 1.60–10.00)
LYMPH#: 2.1 10*3/uL (ref 0.9–3.3)
LYMPH%: 27.4 % (ref 14.0–49.7)
MCH: 31.6 pg (ref 25.1–34.0)
MCHC: 32.9 g/dL (ref 31.5–36.0)
MCV: 96.3 fL (ref 79.5–101.0)
MONO#: 2.3 10*3/uL — AB (ref 0.1–0.9)
MONO%: 29.8 % — ABNORMAL HIGH (ref 0.0–14.0)
NEUT#: 3.2 10*3/uL (ref 1.5–6.5)
NEUT%: 41.1 % (ref 38.4–76.8)
Platelets: 398 10*3/uL (ref 145–400)
RBC: 2.97 10*6/uL — AB (ref 3.70–5.45)
RDW: 22 % — AB (ref 11.2–14.5)
RETIC %: 1.1 % (ref 0.70–2.10)
RETIC CT ABS: 32.67 10*3/uL — AB (ref 33.70–90.70)
WBC: 7.8 10*3/uL (ref 3.9–10.3)

## 2014-01-27 LAB — TECHNOLOGIST REVIEW

## 2014-01-27 LAB — HOLD TUBE, BLOOD BANK

## 2014-01-27 MED ORDER — INFLUENZA VAC SPLIT QUAD 0.5 ML IM SUSY
0.5000 mL | PREFILLED_SYRINGE | Freq: Once | INTRAMUSCULAR | Status: AC
  Administered 2014-01-27: 0.5 mL via INTRAMUSCULAR
  Filled 2014-01-27: qty 0.5

## 2014-01-27 MED ORDER — PNEUMOCOCCAL 13-VAL CONJ VACC IM SUSP
0.5000 mL | INTRAMUSCULAR | Status: AC
  Administered 2014-01-27: 0.5 mL via INTRAMUSCULAR
  Filled 2014-01-27: qty 0.5

## 2014-01-27 MED ORDER — DARBEPOETIN ALFA-POLYSORBATE 500 MCG/ML IJ SOLN
500.0000 ug | Freq: Once | INTRAMUSCULAR | Status: AC
  Administered 2014-01-27: 500 ug via SUBCUTANEOUS
  Filled 2014-01-27: qty 1

## 2014-01-28 ENCOUNTER — Other Ambulatory Visit: Payer: BC Managed Care – PPO

## 2014-01-28 ENCOUNTER — Ambulatory Visit: Payer: BC Managed Care – PPO

## 2014-02-07 ENCOUNTER — Inpatient Hospital Stay: Payer: BC Managed Care – PPO | Admitting: Emergency Medicine

## 2014-02-11 ENCOUNTER — Ambulatory Visit: Payer: BC Managed Care – PPO

## 2014-02-11 ENCOUNTER — Other Ambulatory Visit: Payer: BC Managed Care – PPO

## 2014-02-17 ENCOUNTER — Ambulatory Visit (HOSPITAL_BASED_OUTPATIENT_CLINIC_OR_DEPARTMENT_OTHER): Payer: BC Managed Care – PPO | Admitting: Hematology and Oncology

## 2014-02-17 ENCOUNTER — Encounter: Payer: Self-pay | Admitting: Hematology and Oncology

## 2014-02-17 ENCOUNTER — Telehealth: Payer: Self-pay | Admitting: Hematology and Oncology

## 2014-02-17 ENCOUNTER — Other Ambulatory Visit (HOSPITAL_BASED_OUTPATIENT_CLINIC_OR_DEPARTMENT_OTHER): Payer: BC Managed Care – PPO

## 2014-02-17 VITALS — BP 81/49 | HR 80 | Temp 97.4°F | Resp 16 | Ht 65.0 in | Wt 119.7 lb

## 2014-02-17 DIAGNOSIS — D638 Anemia in other chronic diseases classified elsewhere: Secondary | ICD-10-CM

## 2014-02-17 DIAGNOSIS — D630 Anemia in neoplastic disease: Secondary | ICD-10-CM

## 2014-02-17 DIAGNOSIS — C9310 Chronic myelomonocytic leukemia not having achieved remission: Secondary | ICD-10-CM

## 2014-02-17 DIAGNOSIS — E860 Dehydration: Secondary | ICD-10-CM

## 2014-02-17 DIAGNOSIS — Z932 Ileostomy status: Secondary | ICD-10-CM

## 2014-02-17 DIAGNOSIS — E538 Deficiency of other specified B group vitamins: Secondary | ICD-10-CM

## 2014-02-17 LAB — CBC & DIFF AND RETIC
BASO%: 0.4 % (ref 0.0–2.0)
BASOS ABS: 0 10*3/uL (ref 0.0–0.1)
EOS ABS: 0.1 10*3/uL (ref 0.0–0.5)
EOS%: 0.8 % (ref 0.0–7.0)
HCT: 26.7 % — ABNORMAL LOW (ref 34.8–46.6)
HGB: 8.6 g/dL — ABNORMAL LOW (ref 11.6–15.9)
Immature Retic Fract: 13.6 % — ABNORMAL HIGH (ref 1.60–10.00)
LYMPH%: 27.5 % (ref 14.0–49.7)
MCH: 32.8 pg (ref 25.1–34.0)
MCHC: 32.2 g/dL (ref 31.5–36.0)
MCV: 101.9 fL — ABNORMAL HIGH (ref 79.5–101.0)
MONO#: 3.9 10*3/uL — ABNORMAL HIGH (ref 0.1–0.9)
MONO%: 40.3 % — AB (ref 0.0–14.0)
NEUT#: 3 10*3/uL (ref 1.5–6.5)
NEUT%: 31 % — ABNORMAL LOW (ref 38.4–76.8)
Platelets: 286 10*3/uL (ref 145–400)
RBC: 2.62 10*6/uL — AB (ref 3.70–5.45)
RDW: 23.4 % — AB (ref 11.2–14.5)
RETIC %: 2.75 % — AB (ref 0.70–2.10)
Retic Ct Abs: 72.05 10*3/uL (ref 33.70–90.70)
WBC: 9.6 10*3/uL (ref 3.9–10.3)
lymph#: 2.6 10*3/uL (ref 0.9–3.3)

## 2014-02-17 LAB — HOLD TUBE, BLOOD BANK

## 2014-02-17 LAB — TECHNOLOGIST REVIEW

## 2014-02-17 MED ORDER — DARBEPOETIN ALFA 500 MCG/ML IJ SOSY
500.0000 ug | PREFILLED_SYRINGE | Freq: Once | INTRAMUSCULAR | Status: AC
  Administered 2014-02-17: 500 ug via SUBCUTANEOUS
  Filled 2014-02-17: qty 1

## 2014-02-17 NOTE — Patient Instructions (Signed)
Darbepoetin Alfa injection What is this medicine? DARBEPOETIN ALFA (dar be POE e tin AL fa) helps your body make more red blood cells. It is used to treat anemia caused by chronic kidney failure and chemotherapy. This medicine may be used for other purposes; ask your health care provider or pharmacist if you have questions. COMMON BRAND NAME(S): Aranesp What should I tell my health care provider before I take this medicine? They need to know if you have any of these conditions: -blood clotting disorders or history of blood clots -cancer patient not on chemotherapy -cystic fibrosis -heart disease, such as angina, heart failure, or a history of a heart attack -hemoglobin level of 12 g/dL or greater -high blood pressure -low levels of folate, iron, or vitamin B12 -seizures -an unusual or allergic reaction to darbepoetin, erythropoietin, albumin, hamster proteins, latex, other medicines, foods, dyes, or preservatives -pregnant or trying to get pregnant -breast-feeding How should I use this medicine? This medicine is for injection into a vein or under the skin. It is usually given by a health care professional in a hospital or clinic setting. If you get this medicine at home, you will be taught how to prepare and give this medicine. Do not shake the solution before you withdraw a dose. Use exactly as directed. Take your medicine at regular intervals. Do not take your medicine more often than directed. It is important that you put your used needles and syringes in a special sharps container. Do not put them in a trash can. If you do not have a sharps container, call your pharmacist or healthcare provider to get one. Talk to your pediatrician regarding the use of this medicine in children. While this medicine may be used in children as young as 1 year for selected conditions, precautions do apply. Overdosage: If you think you have taken too much of this medicine contact a poison control center or  emergency room at once. NOTE: This medicine is only for you. Do not share this medicine with others. What if I miss a dose? If you miss a dose, take it as soon as you can. If it is almost time for your next dose, take only that dose. Do not take double or extra doses. What may interact with this medicine? Do not take this medicine with any of the following medications: -epoetin alfa This list may not describe all possible interactions. Give your health care provider a list of all the medicines, herbs, non-prescription drugs, or dietary supplements you use. Also tell them if you smoke, drink alcohol, or use illegal drugs. Some items may interact with your medicine. What should I watch for while using this medicine? Visit your prescriber or health care professional for regular checks on your progress and for the needed blood tests and blood pressure measurements. It is especially important for the doctor to make sure your hemoglobin level is in the desired range, to limit the risk of potential side effects and to give you the best benefit. Keep all appointments for any recommended tests. Check your blood pressure as directed. Ask your doctor what your blood pressure should be and when you should contact him or her. As your body makes more red blood cells, you may need to take iron, folic acid, or vitamin B supplements. Ask your doctor or health care provider which products are right for you. If you have kidney disease continue dietary restrictions, even though this medication can make you feel better. Talk with your doctor or health   care professional about the foods you eat and the vitamins that you take. What side effects may I notice from receiving this medicine? Side effects that you should report to your doctor or health care professional as soon as possible: -allergic reactions like skin rash, itching or hives, swelling of the face, lips, or tongue -breathing problems -changes in vision -chest  pain -confusion, trouble speaking or understanding -feeling faint or lightheaded, falls -high blood pressure -muscle aches or pains -pain, swelling, warmth in the leg -rapid weight gain -severe headaches -sudden numbness or weakness of the face, arm or leg -trouble walking, dizziness, loss of balance or coordination -seizures (convulsions) -swelling of the ankles, feet, hands -unusually weak or tired Side effects that usually do not require medical attention (report to your doctor or health care professional if they continue or are bothersome): -diarrhea -fever, chills (flu-like symptoms) -headaches -nausea, vomiting -redness, stinging, or swelling at site where injected This list may not describe all possible side effects. Call your doctor for medical advice about side effects. You may report side effects to FDA at 1-800-FDA-1088. Where should I keep my medicine? Keep out of the reach of children. Store in a refrigerator between 2 and 8 degrees C (36 and 46 degrees F). Do not freeze. Do not shake. Throw away any unused portion if using a single-dose vial. Throw away any unused medicine after the expiration date. NOTE: This sheet is a summary. It may not cover all possible information. If you have questions about this medicine, talk to your doctor, pharmacist, or health care provider.  2015, [employer]. (2008-03-18 10:23:57)  

## 2014-02-17 NOTE — Telephone Encounter (Signed)
gv adn printed appt sched and avs fo rpt for NOV thru Feb 2016

## 2014-02-17 NOTE — Assessment & Plan Note (Signed)
I discussed with her the risks, benefits and side effects of erythropoietin stimulating agents for treatment of anemia. She agreed to proceed. We have tried Aranesp as 500 mcg every other week with goal of hemoglobin greater than 10 and to reduce transfusion requirements.

## 2014-02-17 NOTE — Progress Notes (Signed)
[facility] OFFICE PROGRESS NOTE  Patient Care Team: Darlin Coco, MD as PCP - General (Cardiology) Winfield Cunas., MD (Gastroenterology) Garald Balding, MD (Orthopedic Surgery) Jackolyn Confer, MD as Attending Physician (General Surgery) Heath Lark, MD as Consulting Physician (Hematology and Oncology)  SUMMARY OF ONCOLOGIC HISTORY:   CMML (chronic myelomonocytic leukemia)   11/17/2011 Initial Diagnosis CMML (chronic myelomonocytic leukemia)    INTERVAL HISTORY: Please see below for problem oriented charting. The patient was started on Darbopoetin injection on 01/31/2014. She continued to feel weak, shortness of breath with minimal exertion and thought that she is anemic. The patient denies any recent signs or symptoms of bleeding such as spontaneous epistaxis, hematuria or hematochezia. She continued to have diffuse output from her ileostomy, unchanged compared to baseline. She has nonproductive cough.  REVIEW OF SYSTEMS:   Constitutional: Denies fevers, chills or abnormal weight loss Eyes: Denies blurriness of vision Ears, nose, mouth, throat, and face: Denies mucositis or sore throat Cardiovascular: Denies palpitation, chest discomfort or lower extremity swelling Gastrointestinal:  Denies nausea, heartburn or change in bowel habits Skin: Denies abnormal skin rashes Lymphatics: Denies new lymphadenopathy or easy bruising Neurological:Denies numbness, tingling or new weaknesses Behavioral/Psych: Mood is stable, no new changes  All other systems were reviewed with the patient and are negative.  I have reviewed the past medical history, past surgical history, social history and family history with the patient and they are unchanged from previous note.  ALLERGIES:  is allergic to vancomycin; ativan; codeine; tetanus toxoids; penicillins; and xarelto.  MEDICATIONS:  Current Outpatient Prescriptions  Medication Sig Dispense Refill  . albuterol (PROVENTIL  HFA;VENTOLIN HFA) 108 (90 BASE) MCG/ACT inhaler Inhale 1-2 puffs into the lungs every 6 (six) hours as needed for wheezing or shortness of breath.     Marland Kitchen aspirin 325 MG EC tablet Take 325 mg by mouth every morning.     . bisoprolol (ZEBETA) 5 MG tablet Take 5 mg by mouth daily.    Marland Kitchen buPROPion (WELLBUTRIN XL) 300 MG 24 hr tablet Take 300 mg by mouth daily before breakfast.     . calcium carbonate (TUMS E-X 750) 750 MG chewable tablet Chew 1 tablet (750 mg total) by mouth 3 (three) times daily. 90 tablet 0  . celecoxib (CELEBREX) 200 MG capsule     . cyclobenzaprine (FLEXERIL) 10 MG tablet Take 10 mg by mouth 3 (three) times daily as needed for muscle spasms.     Marland Kitchen diltiazem (CARDIZEM CD) 120 MG 24 hr capsule Take 1 capsule (120 mg total) by mouth daily. 90 capsule 1  . diphenoxylate-atropine (LOMOTIL) 2.5-0.025 MG per tablet     . estradiol (ESTRACE) 2 MG tablet Take 2 mg by mouth daily before breakfast.     . famotidine (PEPCID) 40 MG tablet Take 1 tablet (40 mg total) by mouth daily. 30 tablet 0  . fidaxomicin (DIFICID) 200 MG TABS tablet Take 1 tablet (200 mg total) by mouth 2 (two) times daily. 16 tablet 0  . furosemide (LASIX) 40 MG tablet Take 1 tablet (40 mg total) by mouth daily. 7 tablet 0  . HYDROcodone-acetaminophen (NORCO) 10-325 MG per tablet     . HYDROmorphone (DILAUDID) 2 MG tablet Take 2-4 mg by mouth every 6 (six) hours as needed for severe pain.    Marland Kitchen loratadine-pseudoephedrine (CLARITIN-D 12-HOUR) 5-120 MG per tablet Take 1 tablet by mouth daily as needed for allergies.     . magnesium oxide (MAG-OX) 400 (241.3 MG) MG  tablet Take 1 tablet (400 mg total) by mouth 2 (two) times daily. 60 tablet 0  . NEXIUM 40 MG capsule     . ondansetron (ZOFRAN) 4 MG tablet Take 4 mg by mouth every 8 (eight) hours as needed for nausea or vomiting.    . potassium chloride 20 MEQ TBCR Take 20 mEq by mouth 3 (three) times daily. 21 tablet 0  . promethazine (PHENERGAN) 25 MG tablet Take 25 mg by mouth  every 6 (six) hours as needed for nausea or vomiting (nausea).    . saccharomyces boulardii (FLORASTOR) 250 MG capsule Take 1 capsule (250 mg total) by mouth 2 (two) times daily. 30 capsule `   No current facility-administered medications for this visit.    PHYSICAL EXAMINATION: ECOG PERFORMANCE STATUS: 2 - Symptomatic, <50% confined to bed  Filed Vitals:   02/17/14 1017  BP: 81/49  Pulse: 80  Temp: 97.4 F (36.3 C)  Resp: 16   Filed Weights   02/17/14 1017  Weight: 119 lb 11.2 oz (54.296 kg)    GENERAL:alert, no distress and comfortable SKIN: skin color is pale, texture, turgor are normal, no rashes or significant lesions EYES: normal, Conjunctiva are pale and non-injected, sclera clear OROPHARYNX:no exudate, no erythema and lips, buccal mucosa, and tongue normal  NECK: supple, thyroid normal size, non-tender, without nodularity LYMPH:  no palpable lymphadenopathy in the cervical, axillary or inguinal LUNGS: clear to auscultation and percussion with normal breathing effort HEART: regular rate & rhythm and no murmurs and no lower extremity edema ABDOMEN:abdomen soft, non-tender and normal bowel sounds Musculoskeletal:no cyanosis of digits and no clubbing  NEURO: alert & oriented x 3 with fluent speech, no focal motor/sensory deficits  LABORATORY DATA:  I have reviewed the data as listed    Component Value Date/Time   NA 137 12/22/2013 0410   NA 133* 08/28/2013 1211   K 3.6* 12/22/2013 0410   K 3.7 08/28/2013 1211   CL 97 12/22/2013 0410   CL 101 02/09/2012 1321   CO2 29 12/22/2013 0410   CO2 16* 08/28/2013 1211   GLUCOSE 109* 12/22/2013 0410   GLUCOSE 79 08/28/2013 1211   GLUCOSE 79 02/09/2012 1321   BUN 9 12/22/2013 0410   BUN 8.8 08/28/2013 1211   CREATININE 0.74 12/22/2013 0410   CREATININE 0.8 08/28/2013 1211   CREATININE 0.92 08/10/2012 1528   CALCIUM 7.2* 12/22/2013 0410   CALCIUM 5.3* 11/19/2013 1000   CALCIUM 6.7* 08/28/2013 1211   PROT 5.7* 12/14/2013  0215   PROT 6.9 08/28/2013 1211   ALBUMIN 1.8* 12/22/2013 0410   ALBUMIN 2.4* 08/28/2013 1211   AST 17 12/14/2013 0215   AST 19 08/28/2013 1211   ALT 10 12/14/2013 0215   ALT 14 08/28/2013 1211   ALKPHOS 306* 12/14/2013 0215   ALKPHOS 495* 08/28/2013 1211   BILITOT 0.3 12/14/2013 0215   BILITOT 0.37 08/28/2013 1211   GFRNONAA 88* 12/22/2013 0410   GFRAA >90 12/22/2013 0410    No results found for: SPEP, UPEP  Lab Results  Component Value Date   WBC 9.6 02/17/2014   NEUTROABS 3.0 02/17/2014   HGB 8.6* 02/17/2014   HCT 26.7* 02/17/2014   MCV 101.9* 02/17/2014   PLT 286 02/17/2014      Chemistry      Component Value Date/Time   NA 137 12/22/2013 0410   NA 133* 08/28/2013 1211   K 3.6* 12/22/2013 0410   K 3.7 08/28/2013 1211   CL 97 12/22/2013 0410  CL 101 02/09/2012 1321   CO2 29 12/22/2013 0410   CO2 16* 08/28/2013 1211   BUN 9 12/22/2013 0410   BUN 8.8 08/28/2013 1211   CREATININE 0.74 12/22/2013 0410   CREATININE 0.8 08/28/2013 1211   CREATININE 0.92 08/10/2012 1528      Component Value Date/Time   CALCIUM 7.2* 12/22/2013 0410   CALCIUM 5.3* 11/19/2013 1000   CALCIUM 6.7* 08/28/2013 1211   ALKPHOS 306* 12/14/2013 0215   ALKPHOS 495* 08/28/2013 1211   AST 17 12/14/2013 0215   AST 19 08/28/2013 1211   ALT 10 12/14/2013 0215   ALT 14 08/28/2013 1211   BILITOT 0.3 12/14/2013 0215   BILITOT 0.37 08/28/2013 1211     ASSESSMENT & PLAN:  CMML (chronic myelomonocytic leukemia) I discussed with her the risks, benefits and side effects of erythropoietin stimulating agents for treatment of anemia. She agreed to proceed. We have tried Aranesp as 500 mcg every other week with goal of hemoglobin greater than 10 and to reduce transfusion requirements.    Anemia in neoplastic disease We discussed some of the risks, benefits, and alternatives of blood transfusions. The patient is symptomatic from anemia and the hemoglobin level is critically low.  Some of the  side-effects to be expected including risks of transfusion reactions, chills, infection, syndrome of volume overload and risk of hospitalization from various reasons and the patient is willing to proceed and went ahead to sign consent today. I recommend 2 units of blood transfusion when her hemoglobin dropped to less than 8 g.     B12 deficiency Her last vitamin B-12 level done 2 months ago were within normal limits. She will continue on replacement therapy.   No orders of the defined types were placed in this encounter.   All questions were answered. The patient knows to call the clinic with any problems, questions or concerns. No barriers to learning was detected. I spent 25 minutes counseling the patient face to face. The total time spent in the appointment was 30 minutes and more than 50% was on counseling and review of test results     [facility], [place], MD 02/17/2014 11:23 AM

## 2014-02-17 NOTE — Assessment & Plan Note (Signed)
We discussed some of the risks, benefits, and alternatives of blood transfusions. The patient is symptomatic from anemia and the hemoglobin level is critically low.  Some of the side-effects to be expected including risks of transfusion reactions, chills, infection, syndrome of volume overload and risk of hospitalization from various reasons and the patient is willing to proceed and went ahead to sign consent today. I recommend 2 units of blood transfusion when her hemoglobin dropped to less than 8 g.

## 2014-02-17 NOTE — Assessment & Plan Note (Signed)
Her last vitamin B-12 level done 2 months ago were within normal limits. She will continue on replacement therapy.

## 2014-02-18 ENCOUNTER — Ambulatory Visit: Payer: BC Managed Care – PPO | Admitting: Hematology and Oncology

## 2014-02-18 ENCOUNTER — Other Ambulatory Visit: Payer: BC Managed Care – PPO

## 2014-02-25 ENCOUNTER — Other Ambulatory Visit: Payer: BC Managed Care – PPO

## 2014-02-25 ENCOUNTER — Ambulatory Visit: Payer: BC Managed Care – PPO

## 2014-02-26 ENCOUNTER — Other Ambulatory Visit: Payer: Self-pay | Admitting: Hematology and Oncology

## 2014-03-03 ENCOUNTER — Ambulatory Visit (HOSPITAL_BASED_OUTPATIENT_CLINIC_OR_DEPARTMENT_OTHER): Payer: BC Managed Care – PPO

## 2014-03-03 ENCOUNTER — Other Ambulatory Visit (HOSPITAL_BASED_OUTPATIENT_CLINIC_OR_DEPARTMENT_OTHER): Payer: BC Managed Care – PPO

## 2014-03-03 DIAGNOSIS — C9310 Chronic myelomonocytic leukemia not having achieved remission: Secondary | ICD-10-CM

## 2014-03-03 DIAGNOSIS — D630 Anemia in neoplastic disease: Secondary | ICD-10-CM

## 2014-03-03 DIAGNOSIS — E860 Dehydration: Secondary | ICD-10-CM

## 2014-03-03 DIAGNOSIS — Z932 Ileostomy status: Secondary | ICD-10-CM

## 2014-03-03 DIAGNOSIS — D638 Anemia in other chronic diseases classified elsewhere: Secondary | ICD-10-CM

## 2014-03-03 LAB — CBC & DIFF AND RETIC
BASO%: 1.2 % (ref 0.0–2.0)
BASOS ABS: 0.1 10*3/uL (ref 0.0–0.1)
EOS%: 0.4 % (ref 0.0–7.0)
Eosinophils Absolute: 0.1 10*3/uL (ref 0.0–0.5)
HEMATOCRIT: 28.9 % — AB (ref 34.8–46.6)
HGB: 9.3 g/dL — ABNORMAL LOW (ref 11.6–15.9)
IMMATURE RETIC FRACT: 12.8 % — AB (ref 1.60–10.00)
LYMPH#: 2.7 10*3/uL (ref 0.9–3.3)
LYMPH%: 23.6 % (ref 14.0–49.7)
MCH: 34.1 pg — ABNORMAL HIGH (ref 25.1–34.0)
MCHC: 32.2 g/dL (ref 31.5–36.0)
MCV: 105.9 fL — ABNORMAL HIGH (ref 79.5–101.0)
MONO#: 4.1 10*3/uL — AB (ref 0.1–0.9)
MONO%: 36.4 % — ABNORMAL HIGH (ref 0.0–14.0)
NEUT#: 4.3 10*3/uL (ref 1.5–6.5)
NEUT%: 38.4 % (ref 38.4–76.8)
PLATELETS: 224 10*3/uL (ref 145–400)
RBC: 2.73 10*6/uL — ABNORMAL LOW (ref 3.70–5.45)
RDW: 21.7 % — ABNORMAL HIGH (ref 11.2–14.5)
Retic %: 5.67 % — ABNORMAL HIGH (ref 0.70–2.10)
Retic Ct Abs: 154.79 10*3/uL — ABNORMAL HIGH (ref 33.70–90.70)
WBC: 11.3 10*3/uL — AB (ref 3.9–10.3)

## 2014-03-03 LAB — HOLD TUBE, BLOOD BANK

## 2014-03-03 LAB — TECHNOLOGIST REVIEW

## 2014-03-03 MED ORDER — DARBEPOETIN ALFA 500 MCG/ML IJ SOSY
500.0000 ug | PREFILLED_SYRINGE | Freq: Once | INTRAMUSCULAR | Status: AC
  Administered 2014-03-03: 500 ug via SUBCUTANEOUS
  Filled 2014-03-03: qty 1

## 2014-03-03 NOTE — Patient Instructions (Signed)
Darbepoetin Alfa injection What is this medicine? DARBEPOETIN ALFA (dar be POE e tin AL fa) helps your body make more red blood cells. It is used to treat anemia caused by chronic kidney failure and chemotherapy. This medicine may be used for other purposes; ask your health care provider or pharmacist if you have questions. COMMON BRAND NAME(S): Aranesp What should I tell my health care provider before I take this medicine? They need to know if you have any of these conditions: -blood clotting disorders or history of blood clots -cancer patient not on chemotherapy -cystic fibrosis -heart disease, such as angina, heart failure, or a history of a heart attack -hemoglobin level of 12 g/dL or greater -high blood pressure -low levels of folate, iron, or vitamin B12 -seizures -an unusual or allergic reaction to darbepoetin, erythropoietin, albumin, hamster proteins, latex, other medicines, foods, dyes, or preservatives -pregnant or trying to get pregnant -breast-feeding How should I use this medicine? This medicine is for injection into a vein or under the skin. It is usually given by a health care professional in a hospital or clinic setting. If you get this medicine at home, you will be taught how to prepare and give this medicine. Do not shake the solution before you withdraw a dose. Use exactly as directed. Take your medicine at regular intervals. Do not take your medicine more often than directed. It is important that you put your used needles and syringes in a special sharps container. Do not put them in a trash can. If you do not have a sharps container, call your pharmacist or healthcare provider to get one. Talk to your pediatrician regarding the use of this medicine in children. While this medicine may be used in children as young as 1 year for selected conditions, precautions do apply. Overdosage: If you think you have taken too much of this medicine contact a poison control center or  emergency room at once. NOTE: This medicine is only for you. Do not share this medicine with others. What if I miss a dose? If you miss a dose, take it as soon as you can. If it is almost time for your next dose, take only that dose. Do not take double or extra doses. What may interact with this medicine? Do not take this medicine with any of the following medications: -epoetin alfa This list may not describe all possible interactions. Give your health care provider a list of all the medicines, herbs, non-prescription drugs, or dietary supplements you use. Also tell them if you smoke, drink alcohol, or use illegal drugs. Some items may interact with your medicine. What should I watch for while using this medicine? Visit your prescriber or health care professional for regular checks on your progress and for the needed blood tests and blood pressure measurements. It is especially important for the doctor to make sure your hemoglobin level is in the desired range, to limit the risk of potential side effects and to give you the best benefit. Keep all appointments for any recommended tests. Check your blood pressure as directed. Ask your doctor what your blood pressure should be and when you should contact him or her. As your body makes more red blood cells, you may need to take iron, folic acid, or vitamin B supplements. Ask your doctor or health care provider which products are right for you. If you have kidney disease continue dietary restrictions, even though this medication can make you feel better. Talk with your doctor or health   care professional about the foods you eat and the vitamins that you take. What side effects may I notice from receiving this medicine? Side effects that you should report to your doctor or health care professional as soon as possible: -allergic reactions like skin rash, itching or hives, swelling of the face, lips, or tongue -breathing problems -changes in vision -chest  pain -confusion, trouble speaking or understanding -feeling faint or lightheaded, falls -high blood pressure -muscle aches or pains -pain, swelling, warmth in the leg -rapid weight gain -severe headaches -sudden numbness or weakness of the face, arm or leg -trouble walking, dizziness, loss of balance or coordination -seizures (convulsions) -swelling of the ankles, feet, hands -unusually weak or tired Side effects that usually do not require medical attention (report to your doctor or health care professional if they continue or are bothersome): -diarrhea -fever, chills (flu-like symptoms) -headaches -nausea, vomiting -redness, stinging, or swelling at site where injected This list may not describe all possible side effects. Call your doctor for medical advice about side effects. You may report side effects to FDA at 1-800-FDA-1088. Where should I keep my medicine? Keep out of the reach of children. Store in a refrigerator between 2 and 8 degrees C (36 and 46 degrees F). Do not freeze. Do not shake. Throw away any unused portion if using a single-dose vial. Throw away any unused medicine after the expiration date. NOTE: This sheet is a summary. It may not cover all possible information. If you have questions about this medicine, talk to your doctor, pharmacist, or health care provider.  2015, [employer]. (2008-03-18 10:23:57)  

## 2014-03-17 ENCOUNTER — Other Ambulatory Visit (HOSPITAL_BASED_OUTPATIENT_CLINIC_OR_DEPARTMENT_OTHER): Payer: BC Managed Care – PPO

## 2014-03-17 ENCOUNTER — Ambulatory Visit (HOSPITAL_BASED_OUTPATIENT_CLINIC_OR_DEPARTMENT_OTHER): Payer: BC Managed Care – PPO

## 2014-03-17 ENCOUNTER — Other Ambulatory Visit: Payer: Self-pay | Admitting: Hematology and Oncology

## 2014-03-17 DIAGNOSIS — D630 Anemia in neoplastic disease: Secondary | ICD-10-CM

## 2014-03-17 DIAGNOSIS — D638 Anemia in other chronic diseases classified elsewhere: Secondary | ICD-10-CM

## 2014-03-17 DIAGNOSIS — E860 Dehydration: Secondary | ICD-10-CM

## 2014-03-17 DIAGNOSIS — C9310 Chronic myelomonocytic leukemia not having achieved remission: Secondary | ICD-10-CM

## 2014-03-17 DIAGNOSIS — Z932 Ileostomy status: Secondary | ICD-10-CM

## 2014-03-17 LAB — CBC & DIFF AND RETIC
BASO%: 0.5 % (ref 0.0–2.0)
BASOS ABS: 0 10*3/uL (ref 0.0–0.1)
EOS%: 0.4 % (ref 0.0–7.0)
Eosinophils Absolute: 0 10*3/uL (ref 0.0–0.5)
HEMATOCRIT: 28.9 % — AB (ref 34.8–46.6)
HEMOGLOBIN: 9.2 g/dL — AB (ref 11.6–15.9)
Immature Retic Fract: 16 % — ABNORMAL HIGH (ref 1.60–10.00)
LYMPH#: 2.6 10*3/uL (ref 0.9–3.3)
LYMPH%: 30.9 % (ref 14.0–49.7)
MCH: 34.1 pg — ABNORMAL HIGH (ref 25.1–34.0)
MCHC: 31.8 g/dL (ref 31.5–36.0)
MCV: 107 fL — ABNORMAL HIGH (ref 79.5–101.0)
MONO#: 2.6 10*3/uL — ABNORMAL HIGH (ref 0.1–0.9)
MONO%: 30.3 % — ABNORMAL HIGH (ref 0.0–14.0)
NEUT#: 3.2 10*3/uL (ref 1.5–6.5)
NEUT%: 37.9 % — AB (ref 38.4–76.8)
PLATELETS: 290 10*3/uL (ref 145–400)
RBC: 2.7 10*6/uL — ABNORMAL LOW (ref 3.70–5.45)
RDW: 20.4 % — ABNORMAL HIGH (ref 11.2–14.5)
RETIC CT ABS: 135 10*3/uL — AB (ref 33.70–90.70)
Retic %: 5 % — ABNORMAL HIGH (ref 0.70–2.10)
WBC: 8.5 10*3/uL (ref 3.9–10.3)
nRBC: 3 % — ABNORMAL HIGH (ref 0–0)

## 2014-03-17 LAB — HOLD TUBE, BLOOD BANK

## 2014-03-17 LAB — TECHNOLOGIST REVIEW

## 2014-03-17 MED ORDER — DARBEPOETIN ALFA 500 MCG/ML IJ SOSY
500.0000 ug | PREFILLED_SYRINGE | Freq: Once | INTRAMUSCULAR | Status: AC
  Administered 2014-03-17: 500 ug via SUBCUTANEOUS
  Filled 2014-03-17: qty 1

## 2014-03-17 NOTE — Patient Instructions (Signed)
Darbepoetin Alfa injection What is this medicine? DARBEPOETIN ALFA (dar be POE e tin AL fa) helps your body make more red blood cells. It is used to treat anemia caused by chronic kidney failure and chemotherapy. This medicine may be used for other purposes; ask your health care provider or pharmacist if you have questions. COMMON BRAND NAME(S): Aranesp What should I tell my health care provider before I take this medicine? They need to know if you have any of these conditions: -blood clotting disorders or history of blood clots -cancer patient not on chemotherapy -cystic fibrosis -heart disease, such as angina, heart failure, or a history of a heart attack -hemoglobin level of 12 g/dL or greater -high blood pressure -low levels of folate, iron, or vitamin B12 -seizures -an unusual or allergic reaction to darbepoetin, erythropoietin, albumin, hamster proteins, latex, other medicines, foods, dyes, or preservatives -pregnant or trying to get pregnant -breast-feeding How should I use this medicine? This medicine is for injection into a vein or under the skin. It is usually given by a health care professional in a hospital or clinic setting. If you get this medicine at home, you will be taught how to prepare and give this medicine. Do not shake the solution before you withdraw a dose. Use exactly as directed. Take your medicine at regular intervals. Do not take your medicine more often than directed. It is important that you put your used needles and syringes in a special sharps container. Do not put them in a trash can. If you do not have a sharps container, call your pharmacist or healthcare provider to get one. Talk to your pediatrician regarding the use of this medicine in children. While this medicine may be used in children as young as 1 year for selected conditions, precautions do apply. Overdosage: If you think you have taken too much of this medicine contact a poison control center or  emergency room at once. NOTE: This medicine is only for you. Do not share this medicine with others. What if I miss a dose? If you miss a dose, take it as soon as you can. If it is almost time for your next dose, take only that dose. Do not take double or extra doses. What may interact with this medicine? Do not take this medicine with any of the following medications: -epoetin alfa This list may not describe all possible interactions. Give your health care provider a list of all the medicines, herbs, non-prescription drugs, or dietary supplements you use. Also tell them if you smoke, drink alcohol, or use illegal drugs. Some items may interact with your medicine. What should I watch for while using this medicine? Visit your prescriber or health care professional for regular checks on your progress and for the needed blood tests and blood pressure measurements. It is especially important for the doctor to make sure your hemoglobin level is in the desired range, to limit the risk of potential side effects and to give you the best benefit. Keep all appointments for any recommended tests. Check your blood pressure as directed. Ask your doctor what your blood pressure should be and when you should contact him or her. As your body makes more red blood cells, you may need to take iron, folic acid, or vitamin B supplements. Ask your doctor or health care provider which products are right for you. If you have kidney disease continue dietary restrictions, even though this medication can make you feel better. Talk with your doctor or health   care professional about the foods you eat and the vitamins that you take. What side effects may I notice from receiving this medicine? Side effects that you should report to your doctor or health care professional as soon as possible: -allergic reactions like skin rash, itching or hives, swelling of the face, lips, or tongue -breathing problems -changes in vision -chest  pain -confusion, trouble speaking or understanding -feeling faint or lightheaded, falls -high blood pressure -muscle aches or pains -pain, swelling, warmth in the leg -rapid weight gain -severe headaches -sudden numbness or weakness of the face, arm or leg -trouble walking, dizziness, loss of balance or coordination -seizures (convulsions) -swelling of the ankles, feet, hands -unusually weak or tired Side effects that usually do not require medical attention (report to your doctor or health care professional if they continue or are bothersome): -diarrhea -fever, chills (flu-like symptoms) -headaches -nausea, vomiting -redness, stinging, or swelling at site where injected This list may not describe all possible side effects. Call your doctor for medical advice about side effects. You may report side effects to FDA at 1-800-FDA-1088. Where should I keep my medicine? Keep out of the reach of children. Store in a refrigerator between 2 and 8 degrees C (36 and 46 degrees F). Do not freeze. Do not shake. Throw away any unused portion if using a single-dose vial. Throw away any unused medicine after the expiration date. NOTE: This sheet is a summary. It may not cover all possible information. If you have questions about this medicine, talk to your doctor, pharmacist, or health care provider.  2015, [employer]. (2008-03-18 10:23:57)  

## 2014-03-28 ENCOUNTER — Other Ambulatory Visit: Payer: Self-pay

## 2014-03-31 ENCOUNTER — Other Ambulatory Visit: Payer: BC Managed Care – PPO

## 2014-03-31 ENCOUNTER — Ambulatory Visit: Payer: BC Managed Care – PPO

## 2014-03-31 ENCOUNTER — Telehealth: Payer: Self-pay | Admitting: Hematology and Oncology

## 2014-03-31 NOTE — Telephone Encounter (Signed)
pt called to r/s missed appt...done....pt aware of new d.t °

## 2014-04-01 ENCOUNTER — Ambulatory Visit (HOSPITAL_BASED_OUTPATIENT_CLINIC_OR_DEPARTMENT_OTHER): Payer: 59

## 2014-04-01 ENCOUNTER — Other Ambulatory Visit: Payer: Self-pay | Admitting: *Deleted

## 2014-04-01 ENCOUNTER — Ambulatory Visit (HOSPITAL_COMMUNITY)
Admission: RE | Admit: 2014-04-01 | Discharge: 2014-04-01 | Disposition: A | Discharge status: Home / Self Care | Payer: 59 | Source: Ambulatory Visit | Attending: Hematology and Oncology | Admitting: Hematology and Oncology

## 2014-04-01 ENCOUNTER — Other Ambulatory Visit (HOSPITAL_BASED_OUTPATIENT_CLINIC_OR_DEPARTMENT_OTHER): Payer: 59

## 2014-04-01 VITALS — BP 106/66 | HR 98 | Temp 98.4°F | Resp 18

## 2014-04-01 DIAGNOSIS — D630 Anemia in neoplastic disease: Secondary | ICD-10-CM | POA: Insufficient documentation

## 2014-04-01 DIAGNOSIS — E860 Dehydration: Secondary | ICD-10-CM

## 2014-04-01 DIAGNOSIS — C9310 Chronic myelomonocytic leukemia not having achieved remission: Secondary | ICD-10-CM

## 2014-04-01 DIAGNOSIS — Z932 Ileostomy status: Secondary | ICD-10-CM

## 2014-04-01 DIAGNOSIS — D469 Myelodysplastic syndrome, unspecified: Secondary | ICD-10-CM

## 2014-04-01 DIAGNOSIS — D638 Anemia in other chronic diseases classified elsewhere: Secondary | ICD-10-CM

## 2014-04-01 LAB — CBC & DIFF AND RETIC
BASO%: 0.4 % (ref 0.0–2.0)
Basophils Absolute: 0 10*3/uL (ref 0.0–0.1)
EOS%: 0.6 % (ref 0.0–7.0)
Eosinophils Absolute: 0.1 10*3/uL (ref 0.0–0.5)
HCT: 26.4 % — ABNORMAL LOW (ref 34.8–46.6)
HGB: 8.2 g/dL — ABNORMAL LOW (ref 11.6–15.9)
IMMATURE RETIC FRACT: 13.4 % — AB (ref 1.60–10.00)
LYMPH#: 1.3 10*3/uL (ref 0.9–3.3)
LYMPH%: 17.3 % (ref 14.0–49.7)
MCH: 34.3 pg — ABNORMAL HIGH (ref 25.1–34.0)
MCHC: 31.1 g/dL — ABNORMAL LOW (ref 31.5–36.0)
MCV: 110.5 fL — ABNORMAL HIGH (ref 79.5–101.0)
MONO#: 3.2 10*3/uL — AB (ref 0.1–0.9)
MONO%: 41.5 % — ABNORMAL HIGH (ref 0.0–14.0)
NEUT#: 3.1 10*3/uL (ref 1.5–6.5)
NEUT%: 40.2 % (ref 38.4–76.8)
Platelets: 261 10*3/uL (ref 145–400)
RBC: 2.39 10*6/uL — ABNORMAL LOW (ref 3.70–5.45)
RDW: 19.2 % — ABNORMAL HIGH (ref 11.2–14.5)
RETIC %: 4.57 % — AB (ref 0.70–2.10)
RETIC CT ABS: 109.22 10*3/uL — AB (ref 33.70–90.70)
WBC: 7.7 10*3/uL (ref 3.9–10.3)

## 2014-04-01 LAB — PREPARE RBC (CROSSMATCH)

## 2014-04-01 LAB — HOLD TUBE, BLOOD BANK

## 2014-04-01 LAB — TECHNOLOGIST REVIEW

## 2014-04-01 MED ORDER — SODIUM CHLORIDE 0.9 % IJ SOLN
10.0000 mL | INTRAMUSCULAR | Status: DC | PRN
  Filled 2014-04-01: qty 10

## 2014-04-01 MED ORDER — DARBEPOETIN ALFA 500 MCG/ML IJ SOSY
500.0000 ug | PREFILLED_SYRINGE | Freq: Once | INTRAMUSCULAR | Status: AC
  Administered 2014-04-01: 500 ug via SUBCUTANEOUS
  Filled 2014-04-01: qty 1

## 2014-04-01 MED ORDER — DIPHENHYDRAMINE HCL 25 MG PO CAPS
25.0000 mg | ORAL_CAPSULE | Freq: Once | ORAL | Status: AC
  Administered 2014-04-01: 25 mg via ORAL

## 2014-04-01 MED ORDER — ACETAMINOPHEN 325 MG PO TABS
650.0000 mg | ORAL_TABLET | Freq: Once | ORAL | Status: AC
  Administered 2014-04-01: 650 mg via ORAL

## 2014-04-01 MED ORDER — ACETAMINOPHEN 325 MG PO TABS
ORAL_TABLET | ORAL | Status: AC
  Filled 2014-04-01: qty 2

## 2014-04-01 MED ORDER — DIPHENHYDRAMINE HCL 25 MG PO CAPS
ORAL_CAPSULE | ORAL | Status: AC
  Filled 2014-04-01: qty 1

## 2014-04-01 NOTE — Patient Instructions (Signed)
Blood Transfusion Information WHAT IS A BLOOD TRANSFUSION? A transfusion is the replacement of blood or some of its parts. Blood is made up of multiple cells which provide different functions.  Red blood cells carry oxygen and are used for blood loss replacement.  White blood cells fight against infection.  Platelets control bleeding.  Plasma helps clot blood.  Other blood products are available for specialized needs, such as hemophilia or other clotting disorders. BEFORE THE TRANSFUSION  Who gives blood for transfusions?   You may be able to donate blood to be used at a later date on yourself (autologous donation).  Relatives can be asked to donate blood. This is generally not any safer than if you have received blood from a stranger. The same precautions are taken to ensure safety when a relative's blood is donated.  Healthy volunteers who are fully evaluated to make sure their blood is safe. This is blood bank blood. Transfusion therapy is the safest it has ever been in the practice of medicine. Before blood is taken from a donor, a complete history is taken to make sure that person has no history of diseases nor engages in risky social behavior (examples are intravenous drug use or sexual activity with multiple partners). The donor's travel history is screened to minimize risk of transmitting infections, such as malaria. The donated blood is tested for signs of infectious diseases, such as HIV and hepatitis. The blood is then tested to be sure it is compatible with you in order to minimize the chance of a transfusion reaction. If you or a relative donates blood, this is often done in anticipation of surgery and is not appropriate for emergency situations. It takes many days to process the donated blood. RISKS AND COMPLICATIONS Although transfusion therapy is very safe and saves many lives, the main dangers of transfusion include:   Getting an infectious disease.  Developing a  transfusion reaction. This is an allergic reaction to something in the blood you were given. Every precaution is taken to prevent this. The decision to have a blood transfusion has been considered carefully by your caregiver before blood is given. Blood is not given unless the benefits outweigh the risks. AFTER THE TRANSFUSION  Right after receiving a blood transfusion, you will usually feel much better and more energetic. This is especially true if your red blood cells have gotten low (anemic). The transfusion raises the level of the red blood cells which carry oxygen, and this usually causes an energy increase.  The nurse administering the transfusion will monitor you carefully for complications. HOME CARE INSTRUCTIONS  No special instructions are needed after a transfusion. You may find your energy is better. Speak with your caregiver about any limitations on activity for underlying diseases you may have. SEEK MEDICAL CARE IF:   Your condition is not improving after your transfusion.  You develop redness or irritation at the intravenous (IV) site. SEEK IMMEDIATE MEDICAL CARE IF:  Any of the following symptoms occur over the next 12 hours:  Shaking chills.  You have a temperature by mouth above 102 F (38.9 C), not controlled by medicine.  Chest, back, or muscle pain.  People around you feel you are not acting correctly or are confused.  Shortness of breath or difficulty breathing.  Dizziness and fainting.  You get a rash or develop hives.  You have a decrease in urine output.  Your urine turns a dark color or changes to pink, red, or brown. Any of the following   symptoms occur over the next 10 days:  You have a temperature by mouth above 102 F (38.9 C), not controlled by medicine.  Shortness of breath.  Weakness after normal activity.  The white part of the eye turns yellow (jaundice).  You have a decrease in the amount of urine or are urinating less often.  Your  urine turns a dark color or changes to pink, red, or brown. Document Released: 04/01/2000 Document Revised: 06/27/2011 Document Reviewed: 11/19/2007 [employer] Patient Information 2014 [employer].  

## 2014-04-01 NOTE — Progress Notes (Signed)
Pt complains of SOB, Fatigue, and that her surgical wound "bled quite a bit on Friday."Hgb 8.2.  Dr Alvy Bimler made aware. Pt to be given 1 unit of PRBC.  Cameo, RN made aware to put in request for blood.

## 2014-04-01 NOTE — Patient Instructions (Signed)
Darbepoetin Alfa injection What is this medicine? DARBEPOETIN ALFA (dar be POE e tin AL fa) helps your body make more red blood cells. It is used to treat anemia caused by chronic kidney failure and chemotherapy. This medicine may be used for other purposes; ask your health care provider or pharmacist if you have questions. COMMON BRAND NAME(S): Aranesp What should I tell my health care provider before I take this medicine? They need to know if you have any of these conditions: -blood clotting disorders or history of blood clots -cancer patient not on chemotherapy -cystic fibrosis -heart disease, such as angina, heart failure, or a history of a heart attack -hemoglobin level of 12 g/dL or greater -high blood pressure -low levels of folate, iron, or vitamin B12 -seizures -an unusual or allergic reaction to darbepoetin, erythropoietin, albumin, hamster proteins, latex, other medicines, foods, dyes, or preservatives -pregnant or trying to get pregnant -breast-feeding How should I use this medicine? This medicine is for injection into a vein or under the skin. It is usually given by a health care professional in a hospital or clinic setting. If you get this medicine at home, you will be taught how to prepare and give this medicine. Do not shake the solution before you withdraw a dose. Use exactly as directed. Take your medicine at regular intervals. Do not take your medicine more often than directed. It is important that you put your used needles and syringes in a special sharps container. Do not put them in a trash can. If you do not have a sharps container, call your pharmacist or healthcare provider to get one. Talk to your pediatrician regarding the use of this medicine in children. While this medicine may be used in children as young as 1 year for selected conditions, precautions do apply. Overdosage: If you think you have taken too much of this medicine contact a poison control center or  emergency room at once. NOTE: This medicine is only for you. Do not share this medicine with others. What if I miss a dose? If you miss a dose, take it as soon as you can. If it is almost time for your next dose, take only that dose. Do not take double or extra doses. What may interact with this medicine? Do not take this medicine with any of the following medications: -epoetin alfa This list may not describe all possible interactions. Give your health care provider a list of all the medicines, herbs, non-prescription drugs, or dietary supplements you use. Also tell them if you smoke, drink alcohol, or use illegal drugs. Some items may interact with your medicine. What should I watch for while using this medicine? Visit your prescriber or health care professional for regular checks on your progress and for the needed blood tests and blood pressure measurements. It is especially important for the doctor to make sure your hemoglobin level is in the desired range, to limit the risk of potential side effects and to give you the best benefit. Keep all appointments for any recommended tests. Check your blood pressure as directed. Ask your doctor what your blood pressure should be and when you should contact him or her. As your body makes more red blood cells, you may need to take iron, folic acid, or vitamin B supplements. Ask your doctor or health care provider which products are right for you. If you have kidney disease continue dietary restrictions, even though this medication can make you feel better. Talk with your doctor or health   care professional about the foods you eat and the vitamins that you take. What side effects may I notice from receiving this medicine? Side effects that you should report to your doctor or health care professional as soon as possible: -allergic reactions like skin rash, itching or hives, swelling of the face, lips, or tongue -breathing problems -changes in vision -chest  pain -confusion, trouble speaking or understanding -feeling faint or lightheaded, falls -high blood pressure -muscle aches or pains -pain, swelling, warmth in the leg -rapid weight gain -severe headaches -sudden numbness or weakness of the face, arm or leg -trouble walking, dizziness, loss of balance or coordination -seizures (convulsions) -swelling of the ankles, feet, hands -unusually weak or tired Side effects that usually do not require medical attention (report to your doctor or health care professional if they continue or are bothersome): -diarrhea -fever, chills (flu-like symptoms) -headaches -nausea, vomiting -redness, stinging, or swelling at site where injected This list may not describe all possible side effects. Call your doctor for medical advice about side effects. You may report side effects to FDA at 1-800-FDA-1088. Where should I keep my medicine? Keep out of the reach of children. Store in a refrigerator between 2 and 8 degrees C (36 and 46 degrees F). Do not freeze. Do not shake. Throw away any unused portion if using a single-dose vial. Throw away any unused medicine after the expiration date. NOTE: This sheet is a summary. It may not cover all possible information. If you have questions about this medicine, talk to your doctor, pharmacist, or health care provider.  2015, [employer]. (2008-03-18 10:23:57)  

## 2014-04-02 LAB — TYPE AND SCREEN
ABO/RH(D): A POS
Antibody Screen: NEGATIVE
Donor AG Type: NEGATIVE
Unit division: 0

## 2014-04-10 ENCOUNTER — Ambulatory Visit (INDEPENDENT_AMBULATORY_CARE_PROVIDER_SITE_OTHER)
Admission: RE | Admit: 2014-04-10 | Discharge: 2014-04-10 | Disposition: A | Discharge status: Home / Self Care | Payer: 59 | Source: Ambulatory Visit | Attending: Internal Medicine | Admitting: Internal Medicine

## 2014-04-10 ENCOUNTER — Ambulatory Visit (INDEPENDENT_AMBULATORY_CARE_PROVIDER_SITE_OTHER): Payer: 59 | Admitting: Internal Medicine

## 2014-04-10 ENCOUNTER — Encounter: Payer: Self-pay | Admitting: Internal Medicine

## 2014-04-10 ENCOUNTER — Other Ambulatory Visit: Payer: BC Managed Care – PPO

## 2014-04-10 VITALS — BP 114/66 | HR 82 | Wt 129.8 lb

## 2014-04-10 DIAGNOSIS — C9310 Chronic myelomonocytic leukemia not having achieved remission: Secondary | ICD-10-CM

## 2014-04-10 DIAGNOSIS — J189 Pneumonia, unspecified organism: Secondary | ICD-10-CM

## 2014-04-10 DIAGNOSIS — I480 Paroxysmal atrial fibrillation: Secondary | ICD-10-CM

## 2014-04-10 NOTE — Progress Notes (Signed)
04/10/14- 84 yoF former smoker, former [employer], [employer],  Self referral-multiple bouts of PNA over past 3 years; former patient of [facility] Hx CMML, chronic bronchitis, AFib, C-diff. She has been followed conservatively for her chronic myelomonocytic leukemia without chemotherapy. Now on Epogen for anemia. Occasional transfusion. History of seasonal rhinitis especially in the fall. 3 years ago Norovirus infectrion was complicated by ischemic colitis and she eventually required total colectomy. Significant weight loss and repeated pneumonias. Last hospitalized August 2015 for dehydration, paroxysmal atrial fibrillation and C.diff. 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 She does not think she has been refluxing or aspirating. She has had pneumovax-23 about 3 years ago and had flu and Prevnar 13 vaccines in 2015. She notices shortness of breath mostly when she is anemic. She denies routine cough or wheeze or phlegm.hmed Past Medical History  Diagnosis Date  . Hypertension     She has a past hx of essential  . 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 Inflammation of joint of knee     Since we last last saw her she developed problems with an acute which required surgical drainage by her orthopedist Dr. Durward Fortes.  Marland Kitchen MRSA (methicillin resistant Staphylococcus aureus)     Knee surgery drainage was positive for MRSA and she was treated with 3 weeks of doxycycline successfully.  . Diarrhea     Mild  . Exogenous obesity   . GERD (gastroesophageal reflux disease)     2 hosp.- ischemic colitis - residual of Norovirus, 05/2011- sm. bowel obstruction  . Headache(784.0)     migraine headache on occas, less now than when she was younger   . Arthritis     L hip, back, neck   . History of blood transfusion sept 2013    04/2011- /w ischemic colitis , trouble with matching blood  sept 2013  . Anemia     will see hematology consult  prior to surgery, recommended by Dr. Mare Ferrari  . Anemia 12/15/2010  . Ischemic colitis 01/31/2012  . Atrial flutter     during hospitalization, 04/2011- related to anemia & illness/stress   . Pneumonia     04/2011- not hospitalized , pt. denies SOB, changes in chest, breathing  . 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 Dizziness     occasional  . B12 deficiency 02/27/2013  . 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 COPD (chronic obstructive pulmonary disease) 09/26/2013   Past Surgical History  Procedure Laterality Date  . Knee surgery      I&D- 2008, post laceration   . Colonoscopy  05/16/2011    Procedure: COLONOSCOPY;  Surgeon: Winfield Cunas., MD;  Location: [facility];  Service: Endoscopy;  Laterality: N/A;  . Small intestine surgery  1992, 1999  . Laparotomy and lysis of adhesions    . Total hip arthroplasty  10/25/2011    Procedure: TOTAL HIP ARTHROPLASTY;  Surgeon: Garald Balding, MD;  Location: [facility];  Service: Orthopedics;  Laterality: Left;  . Appendectomy  1962  . Abdominal hysterectomy  1988  . Partial colectomy and colostomy  sept 2013    mucous fistula done  . Partial colectomy  05/17/2012    Procedure: PARTIAL COLECTOMY;  Surgeon: Odis Hollingshead, MD;  Location: [facility];  Service: General;  Laterality: N/A;  . Colostomy closure  05/17/2012    Procedure: COLOSTOMY CLOSURE;  Surgeon: Odis Hollingshead, MD;  Location: [facility];  Service: General;  Laterality: N/A;  . Laparotomy  05/17/2012    Procedure: EXPLORATORY LAPAROTOMY;  Surgeon: Odis Hollingshead, MD;  Location: [facility];  Service: General;;  . Lysis of adhesion  05/17/2012    Procedure: LYSIS OF ADHESION;  Surgeon: Odis Hollingshead, MD;  Location: [facility];  Service: General;;  . Esophageal biopsy  05/17/2012    Procedure: BIOPSY;  Surgeon: Odis Hollingshead, MD;  Location: [facility];  Service: General;;  omental biopsy  . Laparotomy  05/22/2012    Procedure: EXPLORATORY LAPAROTOMY;  Surgeon:  Odis Hollingshead, MD;  Location: [facility];  Service: General;  Laterality: N/A;  DRAINAGE  INTRA-ABDOMINAL ABSCESS/LYSIS OF ADHESIONSFOR SMALL BOWEL OBSTRUCTION/DIVERTING LOOP ILEOSTOMY  . Tee without cardioversion N/A 11/25/2013    Procedure: TRANSESOPHAGEAL ECHOCARDIOGRAM (TEE);  Surgeon: Dorothy Spark, MD;  Location: [facility];  Service: Cardiovascular;  Laterality: N/A;   Family History  Problem Relation Age of Onset  . Stroke Father   . Atrial fibrillation Father   . Hypertension Mother    History   Social History  . Marital Status: Single    Spouse Name: N/A    Number of Children: 3  . Years of Education: N/A   Occupational History  .      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 Smoking status: Former Smoker -- 0.25 packs/day for 20 years    Types: Cigarettes    Quit date: 03/21/2013  . Smokeless tobacco: Never Used  . Alcohol Use: No     Comment: 1 - 2 glasses of wine per week  . Drug Use: No  . Sexual Activity: Not Currently   Other Topics Concern  . Not on file   Social History Narrative   ROS-see HPI Constitutional:   No-   weight loss, night sweats, fevers, chills, fatigue, lassitude. HEENT:   No-  headaches, difficulty swallowing, tooth/dental problems, sore throat,       No-  sneezing, itching, ear ache, nasal congestion, post nasal drip,  CV:  No-   chest pain, orthopnea, PND, swelling in lower extremities, anasarca,                                  dizziness, palpitations Resp: No-  + shortness of breath with exertion or at rest.              No-   productive cough,  No non-productive cough,  No- coughing up of blood.              No-   change in color of mucus.  No- wheezing.   Skin: No-   rash or lesions. GI:  No-   heartburn, indigestion, abdominal pain, nausea, vomiting, diarrhea,                 change in bowel habits, loss of appetite GU: No-   dysuria, change in color of urine, no urgency or frequency.  No-  flank pain. MS:  No-   joint pain or swelling.  No- decreased range of motion.  No- back pain. Neuro-     nothing unusual Psych:  No- change in mood or affect. No depression or anxiety.  No memory loss.  OBJ- Physical Exam General- Alert, Oriented, Affect-appropriate, Distress- none acute Skin- rash-none, lesions- none,  excoriation- none Lymphadenopathy- none Head- atraumatic            Eyes- Gross vision intact, PERRLA, conjunctivae and secretions clear            Ears- Hearing, canals-normal            Nose- Clear, no-Septal dev, mucus, polyps, erosion, perforation             Throat- Mallampati II , mucosa clear , drainage- none, tonsils- atrophic Neck- flexible , trachea midline, no stridor , thyroid nl, carotid no bruit Chest - symmetrical excursion , unlabored           Heart/CV- RRR , no murmur , no gallop  , no rub, nl s1 s2                           - JVD- none , edema- none, stasis changes- none, varices- none           Lung- clear to P&A, wheeze- none, cough- none , dullness-none, rub- none           Chest wall-  Abd- tender-no, distended-no, bowel sounds-present, HSM- no,+ ileostomy Br/ Gen/ Rectal- Not done, not indicated Extrem- cyanosis- none, clubbing, none, atrophy- none, strength- nl Neuro- grossly intact to observation    .

## 2014-04-10 NOTE — Patient Instructions (Addendum)
Order- lab- Immunoglobulins (IgG, IgA, IgM, IgE                   Pneumococcal antibody level                   Diptheria antibody level                   SPEP                    CXR    Dx chronic myelomonocytic leukemia

## 2014-04-11 LAB — IGG, IGA, IGM
IGM, SERUM: 415 mg/dL — AB (ref 52–322)
IgA: 198 mg/dL (ref 69–380)
IgG (Immunoglobin G), Serum: 1080 mg/dL (ref 690–1700)

## 2014-04-12 NOTE — Assessment & Plan Note (Signed)
She is not describing a chronic bronchitis syndrome and gives history of discrete episodic pneumonias without reflux or aspiration. We can check immunoglobulins and get some idea of basic immunocompetence. The risk factors for probably her chronic myelomonocytic leukemia and intervals of debilitation. Plan-immunoglobulins, pneumococcal and diphtheria antibody levels, chest x-ray

## 2014-04-12 NOTE — Assessment & Plan Note (Signed)
Rhythm feels like regular sinus at this examination

## 2014-04-14 ENCOUNTER — Other Ambulatory Visit: Payer: BC Managed Care – PPO

## 2014-04-14 ENCOUNTER — Ambulatory Visit: Payer: BC Managed Care – PPO

## 2014-04-15 ENCOUNTER — Telehealth: Payer: Self-pay | Admitting: Hematology and Oncology

## 2014-04-15 LAB — PROTEIN ELECTROPHORESIS, SERUM
ALBUMIN ELP: 54.2 % — AB (ref 55.8–66.1)
Alpha-1-Globulin: 6.1 % — ABNORMAL HIGH (ref 2.9–4.9)
Alpha-2-Globulin: 11.2 % (ref 7.1–11.8)
BETA 2: 4.2 % (ref 3.2–6.5)
Beta Globulin: 6.4 % (ref 4.7–7.2)
Gamma Globulin: 17.9 % (ref 11.1–18.8)
Total Protein, Serum Electrophoresis: 6.7 g/dL (ref 6.0–8.3)

## 2014-04-15 LAB — IGE: IgE (Immunoglobulin E), Serum: 2 kU/L (ref <115)

## 2014-04-15 LAB — DIPHTHERIA / TETANUS ANTIBODY PANEL
DIPHTHERIA AB: 0.06 [IU]/mL
Tetanus Toxin Antibody, Total: 0.63 IU/mL (ref >0.15)

## 2014-04-15 NOTE — Telephone Encounter (Signed)
returned pt call and r/s missed appt....pt ok adn aware of new d.t

## 2014-04-16 ENCOUNTER — Ambulatory Visit: Payer: BC Managed Care – PPO

## 2014-04-16 ENCOUNTER — Other Ambulatory Visit: Payer: BC Managed Care – PPO

## 2014-04-16 ENCOUNTER — Telehealth: Payer: Self-pay | Admitting: Hematology and Oncology

## 2014-04-16 NOTE — Telephone Encounter (Signed)
returned call and s.w. pt and r/s missed appt....pt ok and aware °

## 2014-04-17 ENCOUNTER — Other Ambulatory Visit (HOSPITAL_BASED_OUTPATIENT_CLINIC_OR_DEPARTMENT_OTHER): Payer: 59

## 2014-04-17 ENCOUNTER — Other Ambulatory Visit: Payer: Self-pay | Admitting: Hematology and Oncology

## 2014-04-17 ENCOUNTER — Other Ambulatory Visit: Payer: Self-pay | Admitting: *Deleted

## 2014-04-17 ENCOUNTER — Ambulatory Visit (HOSPITAL_BASED_OUTPATIENT_CLINIC_OR_DEPARTMENT_OTHER): Payer: 59

## 2014-04-17 ENCOUNTER — Telehealth: Payer: Self-pay | Admitting: Hematology and Oncology

## 2014-04-17 DIAGNOSIS — D630 Anemia in neoplastic disease: Secondary | ICD-10-CM | POA: Diagnosis not present

## 2014-04-17 DIAGNOSIS — E860 Dehydration: Secondary | ICD-10-CM

## 2014-04-17 DIAGNOSIS — D469 Myelodysplastic syndrome, unspecified: Secondary | ICD-10-CM

## 2014-04-17 DIAGNOSIS — C9310 Chronic myelomonocytic leukemia not having achieved remission: Secondary | ICD-10-CM | POA: Diagnosis not present

## 2014-04-17 DIAGNOSIS — D638 Anemia in other chronic diseases classified elsewhere: Secondary | ICD-10-CM

## 2014-04-17 DIAGNOSIS — Z932 Ileostomy status: Secondary | ICD-10-CM

## 2014-04-17 LAB — CBC & DIFF AND RETIC
BASO%: 0.7 % (ref 0.0–2.0)
BASOS ABS: 0.1 10*3/uL (ref 0.0–0.1)
EOS ABS: 0.1 10*3/uL (ref 0.0–0.5)
EOS%: 0.8 % (ref 0.0–7.0)
HCT: 25.9 % — ABNORMAL LOW (ref 34.8–46.6)
HGB: 7.9 g/dL — ABNORMAL LOW (ref 11.6–15.9)
Immature Retic Fract: 16.1 % — ABNORMAL HIGH (ref 1.60–10.00)
LYMPH%: 23.5 % (ref 14.0–49.7)
MCH: 32.2 pg (ref 25.1–34.0)
MCHC: 30.5 g/dL — ABNORMAL LOW (ref 31.5–36.0)
MCV: 105.7 fL — ABNORMAL HIGH (ref 79.5–101.0)
MONO#: 3.9 10*3/uL — AB (ref 0.1–0.9)
MONO%: 39.2 % — AB (ref 0.0–14.0)
NEUT#: 3.5 10*3/uL (ref 1.5–6.5)
NEUT%: 35.8 % — AB (ref 38.4–76.8)
Platelets: 305 10*3/uL (ref 145–400)
RBC: 2.45 10*6/uL — ABNORMAL LOW (ref 3.70–5.45)
RDW: 24.4 % — AB (ref 11.2–14.5)
Retic %: 3 % — ABNORMAL HIGH (ref 0.70–2.10)
Retic Ct Abs: 73.5 10*3/uL (ref 33.70–90.70)
WBC: 9.9 10*3/uL (ref 3.9–10.3)
lymph#: 2.3 10*3/uL (ref 0.9–3.3)
nRBC: 2 % — ABNORMAL HIGH (ref 0–0)

## 2014-04-17 LAB — PREPARE RBC (CROSSMATCH)

## 2014-04-17 LAB — TECHNOLOGIST REVIEW

## 2014-04-17 LAB — HOLD TUBE, BLOOD BANK

## 2014-04-17 MED ORDER — DARBEPOETIN ALFA 500 MCG/ML IJ SOSY
500.0000 ug | PREFILLED_SYRINGE | Freq: Once | INTRAMUSCULAR | Status: AC
  Administered 2014-04-17: 500 ug via SUBCUTANEOUS
  Filled 2014-04-17: qty 1

## 2014-04-17 NOTE — Patient Instructions (Signed)
Darbepoetin Alfa injection What is this medicine? DARBEPOETIN ALFA (dar be POE e tin AL fa) helps your body make more red blood cells. It is used to treat anemia caused by chronic kidney failure and chemotherapy. This medicine may be used for other purposes; ask your health care provider or pharmacist if you have questions. COMMON BRAND NAME(S): Aranesp What should I tell my health care provider before I take this medicine? They need to know if you have any of these conditions: -blood clotting disorders or history of blood clots -cancer patient not on chemotherapy -cystic fibrosis -heart disease, such as angina, heart failure, or a history of a heart attack -hemoglobin level of 12 g/dL or greater -high blood pressure -low levels of folate, iron, or vitamin B12 -seizures -an unusual or allergic reaction to darbepoetin, erythropoietin, albumin, hamster proteins, latex, other medicines, foods, dyes, or preservatives -pregnant or trying to get pregnant -breast-feeding How should I use this medicine? This medicine is for injection into a vein or under the skin. It is usually given by a health care professional in a hospital or clinic setting. If you get this medicine at home, you will be taught how to prepare and give this medicine. Do not shake the solution before you withdraw a dose. Use exactly as directed. Take your medicine at regular intervals. Do not take your medicine more often than directed. It is important that you put your used needles and syringes in a special sharps container. Do not put them in a trash can. If you do not have a sharps container, call your pharmacist or healthcare provider to get one. Talk to your pediatrician regarding the use of this medicine in children. While this medicine may be used in children as young as 1 year for selected conditions, precautions do apply. Overdosage: If you think you have taken too much of this medicine contact a poison control center or  emergency room at once. NOTE: This medicine is only for you. Do not share this medicine with others. What if I miss a dose? If you miss a dose, take it as soon as you can. If it is almost time for your next dose, take only that dose. Do not take double or extra doses. What may interact with this medicine? Do not take this medicine with any of the following medications: -epoetin alfa This list may not describe all possible interactions. Give your health care provider a list of all the medicines, herbs, non-prescription drugs, or dietary supplements you use. Also tell them if you smoke, drink alcohol, or use illegal drugs. Some items may interact with your medicine. What should I watch for while using this medicine? Visit your prescriber or health care professional for regular checks on your progress and for the needed blood tests and blood pressure measurements. It is especially important for the doctor to make sure your hemoglobin level is in the desired range, to limit the risk of potential side effects and to give you the best benefit. Keep all appointments for any recommended tests. Check your blood pressure as directed. Ask your doctor what your blood pressure should be and when you should contact him or her. As your body makes more red blood cells, you may need to take iron, folic acid, or vitamin B supplements. Ask your doctor or health care provider which products are right for you. If you have kidney disease continue dietary restrictions, even though this medication can make you feel better. Talk with your doctor or health   care professional about the foods you eat and the vitamins that you take. What side effects may I notice from receiving this medicine? Side effects that you should report to your doctor or health care professional as soon as possible: -allergic reactions like skin rash, itching or hives, swelling of the face, lips, or tongue -breathing problems -changes in vision -chest  pain -confusion, trouble speaking or understanding -feeling faint or lightheaded, falls -high blood pressure -muscle aches or pains -pain, swelling, warmth in the leg -rapid weight gain -severe headaches -sudden numbness or weakness of the face, arm or leg -trouble walking, dizziness, loss of balance or coordination -seizures (convulsions) -swelling of the ankles, feet, hands -unusually weak or tired Side effects that usually do not require medical attention (report to your doctor or health care professional if they continue or are bothersome): -diarrhea -fever, chills (flu-like symptoms) -headaches -nausea, vomiting -redness, stinging, or swelling at site where injected This list may not describe all possible side effects. Call your doctor for medical advice about side effects. You may report side effects to FDA at 1-800-FDA-1088. Where should I keep my medicine? Keep out of the reach of children. Store in a refrigerator between 2 and 8 degrees C (36 and 46 degrees F). Do not freeze. Do not shake. Throw away any unused portion if using a single-dose vial. Throw away any unused medicine after the expiration date. NOTE: This sheet is a summary. It may not cover all possible information. If you have questions about this medicine, talk to your doctor, pharmacist, or health care provider.  2015, [employer]. (2008-03-18 10:23:57)  

## 2014-04-17 NOTE — Telephone Encounter (Signed)
s.w. pt and advised on Jan appt.....pt ok and aware °

## 2014-04-19 ENCOUNTER — Ambulatory Visit (HOSPITAL_COMMUNITY)
Admission: RE | Admit: 2014-04-19 | Discharge: 2014-04-19 | Disposition: A | Discharge status: Home / Self Care | Payer: 59 | Source: Ambulatory Visit | Attending: Hematology and Oncology | Admitting: Hematology and Oncology

## 2014-04-19 ENCOUNTER — Ambulatory Visit: Payer: BC Managed Care – PPO

## 2014-04-19 VITALS — BP 130/65 | HR 71 | Temp 97.8°F

## 2014-04-19 DIAGNOSIS — D62 Acute posthemorrhagic anemia: Secondary | ICD-10-CM

## 2014-04-19 NOTE — Progress Notes (Signed)
Attempted to start IV w/o success x 6 (2 RN's) per pt request "because I really want this blood today" Explained process of PICC or Port and pt states "my surgeon doesn't want me to have one because of infections"  Explained to pt that multiple attempts at IV stick also increase risk of infection. Pt tolerated IV attempts well but is disappointed re: no transfusion today.   Due to unsuccessful IV start; instructed pt to call Monday to re-schedule blood transfusion.  Instructions given to pt to go to ED if bleeding, SOB, etc. if symptoms worsen.  Pt and daughter verbalized understanding.

## 2014-04-21 ENCOUNTER — Telehealth: Payer: Self-pay | Admitting: *Deleted

## 2014-04-21 LAB — TYPE AND SCREEN
ABO/RH(D): A POS
Antibody Screen: NEGATIVE
Donor AG Type: NEGATIVE
UNIT DIVISION: 0

## 2014-04-21 NOTE — Telephone Encounter (Signed)
Received message from RN and pt she was unable to get blood transfusion this past Saturday due to they were unable to get IV access.  Dr. Alvy Bimler is aware. I sent message to scheduler to r/s pt this week for lab and one unit of blood.  Pt is aware and will expect call w/ new date and time for lab/transfusion.

## 2014-04-22 ENCOUNTER — Telehealth: Payer: Self-pay | Admitting: Hematology and Oncology

## 2014-04-22 ENCOUNTER — Telehealth: Payer: Self-pay | Admitting: *Deleted

## 2014-04-22 NOTE — Telephone Encounter (Signed)
Informed pt of transfusion appt made for Friday 1/8 w/ lab first for type and cross.  Explained this is our first available appt for transfusion.  Instructed pt to go to ED if she starts to feel much worse or weaker before Friday.  She verbalized understanding.

## 2014-04-22 NOTE — Telephone Encounter (Signed)
per pof to sch pt blood trans-sent MW email to sch pt asap per pof-will call pt after reply

## 2014-04-25 ENCOUNTER — Other Ambulatory Visit: Payer: Self-pay | Admitting: *Deleted

## 2014-04-25 ENCOUNTER — Other Ambulatory Visit (HOSPITAL_BASED_OUTPATIENT_CLINIC_OR_DEPARTMENT_OTHER): Payer: 59

## 2014-04-25 ENCOUNTER — Telehealth: Payer: Self-pay | Admitting: Hematology and Oncology

## 2014-04-25 DIAGNOSIS — D630 Anemia in neoplastic disease: Secondary | ICD-10-CM

## 2014-04-25 DIAGNOSIS — C9310 Chronic myelomonocytic leukemia not having achieved remission: Secondary | ICD-10-CM

## 2014-04-25 DIAGNOSIS — C9290 Myeloid leukemia, unspecified, not having achieved remission: Secondary | ICD-10-CM

## 2014-04-25 LAB — CBC & DIFF AND RETIC
BASO%: 2.1 % — AB (ref 0.0–2.0)
Basophils Absolute: 0.5 10*3/uL — ABNORMAL HIGH (ref 0.0–0.1)
EOS ABS: 0.1 10*3/uL (ref 0.0–0.5)
EOS%: 0.6 % (ref 0.0–7.0)
HCT: 30 % — ABNORMAL LOW (ref 34.8–46.6)
HEMOGLOBIN: 9.1 g/dL — AB (ref 11.6–15.9)
Immature Retic Fract: 40.7 % — ABNORMAL HIGH (ref 1.60–10.00)
LYMPH#: 3.1 10*3/uL (ref 0.9–3.3)
LYMPH%: 13.5 % — ABNORMAL LOW (ref 14.0–49.7)
MCH: 32.3 pg (ref 25.1–34.0)
MCHC: 30.3 g/dL — ABNORMAL LOW (ref 31.5–36.0)
MCV: 106.4 fL — ABNORMAL HIGH (ref 79.5–101.0)
MONO#: 4.5 10*3/uL — AB (ref 0.1–0.9)
MONO%: 19.5 % — ABNORMAL HIGH (ref 0.0–14.0)
NEUT%: 64.3 % (ref 38.4–76.8)
NEUTROS ABS: 14.9 10*3/uL — AB (ref 1.5–6.5)
Platelets: 338 10*3/uL (ref 145–400)
RBC: 2.82 10*6/uL — AB (ref 3.70–5.45)
RDW: 24.2 % — ABNORMAL HIGH (ref 11.2–14.5)
RETIC %: 9.88 % — AB (ref 0.70–2.10)
Retic Ct Abs: 278.62 10*3/uL — ABNORMAL HIGH (ref 33.70–90.70)
WBC: 23.2 10*3/uL — AB (ref 3.9–10.3)
nRBC: 6 % — ABNORMAL HIGH (ref 0–0)

## 2014-04-25 LAB — TECHNOLOGIST REVIEW

## 2014-04-25 LAB — HOLD TUBE, BLOOD BANK

## 2014-04-25 MED ORDER — LEVOFLOXACIN 500 MG PO TABS: 500.0000 mg | ORAL_TABLET | Freq: Every day | ORAL | Status: DC

## 2014-04-25 NOTE — Telephone Encounter (Signed)
Pt confirmed labs/ov per 01/08 POF, gave pt AVS... KJ, sent msg to add 5 hour blood..... KJ

## 2014-04-25 NOTE — Telephone Encounter (Signed)
S/w pt in lobby.  Reviewed cbc results and informed of no need for transfusion today.  Her WBC is elevated and pt says she thinks she has pneumonia.  Dr. Annamaria Boots did cxr on her a few weeks ago and it does show pneumonia.  He did not start pt on antibiotics due to her history of c-diff.  He instructed her to call him if her symptoms worsen.  Pt states she feels worse and asked if dr. Alvy Bimler will prescribe levaquin.  This has helped her pneumonia in the past.   Dr. Alvy Bimler reviewed the CXR done on 12/24.  She agreed to prescribe levaquin as long as pt aware of risk of developing c-diff again could be as high at 50% or more.  If pt starts to have any symptoms of c-diff she is instructed to go to ED.  Pt verbalized understanding of risk and wants to try the levaquin.

## 2014-04-28 ENCOUNTER — Other Ambulatory Visit: Payer: BC Managed Care – PPO

## 2014-04-28 ENCOUNTER — Ambulatory Visit: Payer: BC Managed Care – PPO

## 2014-05-01 ENCOUNTER — Ambulatory Visit (HOSPITAL_BASED_OUTPATIENT_CLINIC_OR_DEPARTMENT_OTHER): Payer: 59

## 2014-05-01 ENCOUNTER — Other Ambulatory Visit (HOSPITAL_BASED_OUTPATIENT_CLINIC_OR_DEPARTMENT_OTHER): Payer: 59

## 2014-05-01 DIAGNOSIS — D638 Anemia in other chronic diseases classified elsewhere: Secondary | ICD-10-CM

## 2014-05-01 DIAGNOSIS — D630 Anemia in neoplastic disease: Secondary | ICD-10-CM

## 2014-05-01 DIAGNOSIS — E860 Dehydration: Secondary | ICD-10-CM

## 2014-05-01 DIAGNOSIS — C9310 Chronic myelomonocytic leukemia not having achieved remission: Secondary | ICD-10-CM

## 2014-05-01 DIAGNOSIS — Z932 Ileostomy status: Secondary | ICD-10-CM

## 2014-05-01 LAB — CBC & DIFF AND RETIC
BASO%: 0.5 % (ref 0.0–2.0)
BASOS ABS: 0 10*3/uL (ref 0.0–0.1)
EOS%: 0.9 % (ref 0.0–7.0)
Eosinophils Absolute: 0.1 10*3/uL (ref 0.0–0.5)
HEMATOCRIT: 26 % — AB (ref 34.8–46.6)
HGB: 7.9 g/dL — ABNORMAL LOW (ref 11.6–15.9)
Immature Retic Fract: 9 % (ref 1.60–10.00)
LYMPH#: 1.6 10*3/uL (ref 0.9–3.3)
LYMPH%: 21.8 % (ref 14.0–49.7)
MCH: 31.7 pg (ref 25.1–34.0)
MCHC: 30.4 g/dL — ABNORMAL LOW (ref 31.5–36.0)
MCV: 104.4 fL — ABNORMAL HIGH (ref 79.5–101.0)
MONO#: 2.4 10*3/uL — ABNORMAL HIGH (ref 0.1–0.9)
MONO%: 32.7 % — ABNORMAL HIGH (ref 0.0–14.0)
NEUT%: 44.1 % (ref 38.4–76.8)
NEUTROS ABS: 3.3 10*3/uL (ref 1.5–6.5)
Platelets: 222 10*3/uL (ref 145–400)
RBC: 2.49 10*6/uL — ABNORMAL LOW (ref 3.70–5.45)
RDW: 23.2 % — AB (ref 11.2–14.5)
RETIC %: 3.07 % — AB (ref 0.70–2.10)
Retic Ct Abs: 76.44 10*3/uL (ref 33.70–90.70)
WBC: 7.4 10*3/uL (ref 3.9–10.3)

## 2014-05-01 LAB — HOLD TUBE, BLOOD BANK

## 2014-05-01 LAB — TECHNOLOGIST REVIEW

## 2014-05-01 MED ORDER — DARBEPOETIN ALFA 500 MCG/ML IJ SOSY
500.0000 ug | PREFILLED_SYRINGE | Freq: Once | INTRAMUSCULAR | Status: AC
  Administered 2014-05-01: 500 ug via SUBCUTANEOUS
  Filled 2014-05-01: qty 1

## 2014-05-01 NOTE — Progress Notes (Signed)
Dr Alvy Bimler notified of pt HGB 7.9. States that pt can either receive blood tomorrow as scheduled or pt can wait to see if HGB will rise on its own.  Pt states that she would rather wait and see how she feels.  States that she is still able to function with her HGB at 7.9.  States that if she starts to feel bad she will call the cancer center with concerns.  Pt received aranesp injection as scheduled.

## 2014-05-01 NOTE — Patient Instructions (Signed)
Darbepoetin Alfa injection What is this medicine? DARBEPOETIN ALFA (dar be POE e tin AL fa) helps your body make more red blood cells. It is used to treat anemia caused by chronic kidney failure and chemotherapy. This medicine may be used for other purposes; ask your health care provider or pharmacist if you have questions. COMMON BRAND NAME(S): Aranesp What should I tell my health care provider before I take this medicine? They need to know if you have any of these conditions: -blood clotting disorders or history of blood clots -cancer patient not on chemotherapy -cystic fibrosis -heart disease, such as angina, heart failure, or a history of a heart attack -hemoglobin level of 12 g/dL or greater -high blood pressure -low levels of folate, iron, or vitamin B12 -seizures -an unusual or allergic reaction to darbepoetin, erythropoietin, albumin, hamster proteins, latex, other medicines, foods, dyes, or preservatives -pregnant or trying to get pregnant -breast-feeding How should I use this medicine? This medicine is for injection into a vein or under the skin. It is usually given by a health care professional in a hospital or clinic setting. If you get this medicine at home, you will be taught how to prepare and give this medicine. Do not shake the solution before you withdraw a dose. Use exactly as directed. Take your medicine at regular intervals. Do not take your medicine more often than directed. It is important that you put your used needles and syringes in a special sharps container. Do not put them in a trash can. If you do not have a sharps container, call your pharmacist or healthcare provider to get one. Talk to your pediatrician regarding the use of this medicine in children. While this medicine may be used in children as young as 1 year for selected conditions, precautions do apply. Overdosage: If you think you have taken too much of this medicine contact a poison control center or  emergency room at once. NOTE: This medicine is only for you. Do not share this medicine with others. What if I miss a dose? If you miss a dose, take it as soon as you can. If it is almost time for your next dose, take only that dose. Do not take double or extra doses. What may interact with this medicine? Do not take this medicine with any of the following medications: -epoetin alfa This list may not describe all possible interactions. Give your health care provider a list of all the medicines, herbs, non-prescription drugs, or dietary supplements you use. Also tell them if you smoke, drink alcohol, or use illegal drugs. Some items may interact with your medicine. What should I watch for while using this medicine? Visit your prescriber or health care professional for regular checks on your progress and for the needed blood tests and blood pressure measurements. It is especially important for the doctor to make sure your hemoglobin level is in the desired range, to limit the risk of potential side effects and to give you the best benefit. Keep all appointments for any recommended tests. Check your blood pressure as directed. Ask your doctor what your blood pressure should be and when you should contact him or her. As your body makes more red blood cells, you may need to take iron, folic acid, or vitamin B supplements. Ask your doctor or health care provider which products are right for you. If you have kidney disease continue dietary restrictions, even though this medication can make you feel better. Talk with your doctor or health   care professional about the foods you eat and the vitamins that you take. What side effects may I notice from receiving this medicine? Side effects that you should report to your doctor or health care professional as soon as possible: -allergic reactions like skin rash, itching or hives, swelling of the face, lips, or tongue -breathing problems -changes in vision -chest  pain -confusion, trouble speaking or understanding -feeling faint or lightheaded, falls -high blood pressure -muscle aches or pains -pain, swelling, warmth in the leg -rapid weight gain -severe headaches -sudden numbness or weakness of the face, arm or leg -trouble walking, dizziness, loss of balance or coordination -seizures (convulsions) -swelling of the ankles, feet, hands -unusually weak or tired Side effects that usually do not require medical attention (report to your doctor or health care professional if they continue or are bothersome): -diarrhea -fever, chills (flu-like symptoms) -headaches -nausea, vomiting -redness, stinging, or swelling at site where injected This list may not describe all possible side effects. Call your doctor for medical advice about side effects. You may report side effects to FDA at 1-800-FDA-1088. Where should I keep my medicine? Keep out of the reach of children. Store in a refrigerator between 2 and 8 degrees C (36 and 46 degrees F). Do not freeze. Do not shake. Throw away any unused portion if using a single-dose vial. Throw away any unused medicine after the expiration date. NOTE: This sheet is a summary. It may not cover all possible information. If you have questions about this medicine, talk to your doctor, pharmacist, or health care provider.  2015, [employer]. (2008-03-18 10:23:57)  

## 2014-05-12 ENCOUNTER — Telehealth: Payer: Self-pay | Admitting: *Deleted

## 2014-05-12 ENCOUNTER — Other Ambulatory Visit: Payer: 59

## 2014-05-12 ENCOUNTER — Other Ambulatory Visit: Payer: BC Managed Care – PPO

## 2014-05-12 ENCOUNTER — Ambulatory Visit: Payer: BC Managed Care – PPO

## 2014-05-12 ENCOUNTER — Other Ambulatory Visit: Payer: Self-pay | Admitting: *Deleted

## 2014-05-12 DIAGNOSIS — R197 Diarrhea, unspecified: Secondary | ICD-10-CM

## 2014-05-12 DIAGNOSIS — C9310 Chronic myelomonocytic leukemia not having achieved remission: Secondary | ICD-10-CM

## 2014-05-12 NOTE — Telephone Encounter (Signed)
Pt called with concern due to recent use of antibiotics for pneumonia- and history of C-Diff.   " I think the C-Diff is back "  Pt has an ileostomy.  Stool has changed to " all liquid and a lot " which were symptoms when she had C-Diff.  [name] states she becomes nauseated when she eats " and the minute I put food in my stomach the diarrhea recurs "  Pt denies any vomiting or fevers.  Pt would like to bring a specimen in to lab for C-Diff.  Plan at present- pt will have a family member bring sample in to the lab this afternoon.  Appointment will be made for lab only today.  THIS NOTE WILL BE SENT TO MD AND NURSE AT DESK FOR FOLLOW UP FOR LAB RESULTS.

## 2014-05-12 NOTE — Telephone Encounter (Signed)
I am not treating C Diff as out-patient. She needs to go to ER

## 2014-05-12 NOTE — Telephone Encounter (Signed)
Instructed pt to go to ED for symptoms and history of c-diff.  Dr. Alvy Bimler does not think C-diff should be treated as out patient and does not want to order labwork for c-diff as out patient.  Notified pt and she says her symptoms are not "that bad yet" and not sure she needs to go to hospitl.   Informed Dr. Alvy Bimler of this and she says pt needs to go to ED.   Instructed pt to go to ED per Dr. Alvy Bimler and she verbalized understanding.

## 2014-05-13 ENCOUNTER — Other Ambulatory Visit: Payer: Self-pay | Admitting: Cardiology

## 2014-05-14 ENCOUNTER — Other Ambulatory Visit (INDEPENDENT_AMBULATORY_CARE_PROVIDER_SITE_OTHER): Payer: Self-pay | Admitting: General Surgery

## 2014-05-14 ENCOUNTER — Other Ambulatory Visit: Payer: Self-pay | Admitting: General Surgery

## 2014-05-15 ENCOUNTER — Other Ambulatory Visit (HOSPITAL_BASED_OUTPATIENT_CLINIC_OR_DEPARTMENT_OTHER): Payer: 59

## 2014-05-15 ENCOUNTER — Ambulatory Visit (HOSPITAL_BASED_OUTPATIENT_CLINIC_OR_DEPARTMENT_OTHER): Payer: 59

## 2014-05-15 ENCOUNTER — Other Ambulatory Visit: Payer: Self-pay | Admitting: Hematology and Oncology

## 2014-05-15 DIAGNOSIS — C9310 Chronic myelomonocytic leukemia not having achieved remission: Secondary | ICD-10-CM | POA: Diagnosis not present

## 2014-05-15 DIAGNOSIS — D630 Anemia in neoplastic disease: Secondary | ICD-10-CM | POA: Diagnosis not present

## 2014-05-15 DIAGNOSIS — Z932 Ileostomy status: Secondary | ICD-10-CM

## 2014-05-15 DIAGNOSIS — D638 Anemia in other chronic diseases classified elsewhere: Secondary | ICD-10-CM

## 2014-05-15 DIAGNOSIS — E860 Dehydration: Secondary | ICD-10-CM

## 2014-05-15 LAB — CBC & DIFF AND RETIC
BASO%: 0.6 % (ref 0.0–2.0)
BASOS ABS: 0.1 10*3/uL (ref 0.0–0.1)
EOS%: 0.7 % (ref 0.0–7.0)
Eosinophils Absolute: 0.1 10*3/uL (ref 0.0–0.5)
HCT: 30 % — ABNORMAL LOW (ref 34.8–46.6)
HGB: 9.2 g/dL — ABNORMAL LOW (ref 11.6–15.9)
IMMATURE RETIC FRACT: 14 % — AB (ref 1.60–10.00)
LYMPH#: 3.1 10*3/uL (ref 0.9–3.3)
LYMPH%: 23.7 % (ref 14.0–49.7)
MCH: 31.5 pg (ref 25.1–34.0)
MCHC: 30.7 g/dL — AB (ref 31.5–36.0)
MCV: 102.7 fL — ABNORMAL HIGH (ref 79.5–101.0)
MONO#: 4.2 10*3/uL — AB (ref 0.1–0.9)
MONO%: 32.3 % — AB (ref 0.0–14.0)
NEUT%: 42.7 % (ref 38.4–76.8)
NEUTROS ABS: 5.6 10*3/uL (ref 1.5–6.5)
Platelets: 240 10*3/uL (ref 145–400)
RBC: 2.92 10*6/uL — AB (ref 3.70–5.45)
RDW: 22.9 % — ABNORMAL HIGH (ref 11.2–14.5)
RETIC %: 4.52 % — AB (ref 0.70–2.10)
RETIC CT ABS: 131.98 10*3/uL — AB (ref 33.70–90.70)
WBC: 13 10*3/uL — AB (ref 3.9–10.3)

## 2014-05-15 LAB — HOLD TUBE, BLOOD BANK

## 2014-05-15 LAB — TECHNOLOGIST REVIEW

## 2014-05-15 MED ORDER — DARBEPOETIN ALFA 500 MCG/ML IJ SOSY
500.0000 ug | PREFILLED_SYRINGE | Freq: Once | INTRAMUSCULAR | Status: AC
  Administered 2014-05-15: 500 ug via SUBCUTANEOUS
  Filled 2014-05-15: qty 1

## 2014-05-19 ENCOUNTER — Ambulatory Visit (HOSPITAL_COMMUNITY)
Admission: RE | Admit: 2014-05-19 | Discharge: 2014-05-19 | Disposition: A | Discharge status: Home / Self Care | Payer: 59 | Source: Ambulatory Visit | Attending: Hematology and Oncology | Admitting: Hematology and Oncology

## 2014-05-19 LAB — STOOL CULTURE

## 2014-05-20 ENCOUNTER — Ambulatory Visit: Payer: Self-pay | Admitting: Cardiology

## 2014-05-23 ENCOUNTER — Ambulatory Visit: Payer: Self-pay | Admitting: Cardiology

## 2014-05-26 ENCOUNTER — Other Ambulatory Visit: Payer: BC Managed Care – PPO

## 2014-05-26 ENCOUNTER — Ambulatory Visit: Payer: BC Managed Care – PPO

## 2014-05-27 ENCOUNTER — Ambulatory Visit (INDEPENDENT_AMBULATORY_CARE_PROVIDER_SITE_OTHER): Payer: 59 | Admitting: Cardiology

## 2014-05-27 ENCOUNTER — Encounter: Payer: Self-pay | Admitting: Cardiology

## 2014-05-27 VITALS — BP 126/78 | HR 76 | Ht 65.0 in | Wt 130.8 lb

## 2014-05-27 DIAGNOSIS — I480 Paroxysmal atrial fibrillation: Secondary | ICD-10-CM

## 2014-05-27 DIAGNOSIS — C9310 Chronic myelomonocytic leukemia not having achieved remission: Secondary | ICD-10-CM

## 2014-05-27 MED ORDER — DILTIAZEM HCL ER COATED BEADS 120 MG PO CP24: 120.0000 mg | ORAL_CAPSULE | Freq: Every day | ORAL | Status: AC

## 2014-05-27 MED ORDER — BUPROPION HCL ER (XL) 300 MG PO TB24: 300.0000 mg | ORAL_TABLET | Freq: Every day | ORAL | Status: AC

## 2014-05-27 NOTE — Patient Instructions (Signed)
Your physician recommends that you continue on your current medications as directed. Please refer to the Current Medication list given to you today.  Your physician recommends that you schedule a follow-up appointment in: 4 MONTH OV 

## 2014-05-27 NOTE — Progress Notes (Signed)
Cardiology Office Note   Date:  05/27/2014   ID:  [name], DOB Jan 31, 1951, MRN [id number]  PCP:  Darlin Coco, MD  Cardiologist:   Darlin Coco, MD   No chief complaint on file.     History of Present Illness: [name] is a 64 y.o. 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 In early 2013 she was hospitalized on the medical service after presenting with sudden onset of severe diarrhea associated with syncope and hypotension. During the course of that hospitalization she underwent colonoscopy which showed ischemic colitis. Also during that hospital stay she also had episodes of paroxysmal atrial fibrillation which responded to beta blocker. During that hospital stay she had an echocardiogram showing normal LV systolic function and normal left atrial size. Because of her past history of anemia and multiple comorbidities she was not placed on warfarin. She has a past history of small bowel obstruction. Her surgeon is Dr. Zella Richer.  She has multiple medical issues which are as outlined below. These include PAF, ischemic colitis, depression, HTN, and tobacco abuse. She has a normal EF per echo from earlier this year.  In September 2013 she was admitted at [facility] with another attack of ischemic colitis. She ended up having colon surgery with a colostomy placed. She had PAF while hospitalized. Also with MRSA from a PICC line.  She subsequently had exploratory abdominal surgery here in [place] and had a prolonged post hospital course. Dr. Zella Richer is her surgeon During her hospitalization the patient had paroxysmal atrial fibrillation with rapid ventricular response. She has had several recent admissions to [facility].  Her most recent admission was 12/14/13 until 12/22/13 she was admitted with C. difficile enterocolitis. Since discharge she has been followed closely by oncology and has  required blood transfusions for recurring severe anemia. She has remained out of the hospital since that time.  She was treated as an outpatient recently with Levaquin for suspected pneumonia.  Subsequently she has had more loose stools and is being tested for recurrence of C. Difficile. She has not been having any chest pain.  No increased shortness of breath.  No dizziness or syncope.  She has had a recent head cold for which she takes Claritin.  She has recently run out of her Wellbutrin XL 300 and notices an increased desire to smoke.  Past Medical History  Diagnosis Date  . Hypertension     She has a past hx of essential  . 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 Inflammation of joint of knee     Since we last last saw her she developed problems with an acute which required surgical drainage by her orthopedist Dr. Durward Fortes.  Marland Kitchen MRSA (methicillin resistant Staphylococcus aureus)     Knee surgery drainage was positive for MRSA and she was treated with 3 weeks of doxycycline successfully.  . Diarrhea     Mild  . Exogenous obesity   . GERD (gastroesophageal reflux disease)     2 hosp.- ischemic colitis - residual of Norovirus, 05/2011- sm. bowel obstruction  . Headache(784.0)     migraine headache on occas, less now than when she was younger   . Arthritis     L hip, back, neck   . History of blood transfusion sept 2013    04/2011- /w ischemic  colitis , trouble with matching blood  sept 2013  . Anemia     will see hematology consult prior to surgery, recommended by Dr. Mare Ferrari  . Anemia 12/15/2010  . Ischemic colitis 01/31/2012  . Atrial flutter     during hospitalization, 04/2011- related to anemia & illness/stress   . Pneumonia     04/2011- not hospitalized , pt. denies SOB, changes in chest, breathing  . 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 Dizziness     occasional    . B12 deficiency 02/27/2013  . 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 COPD (chronic obstructive pulmonary disease) 09/26/2013    Past Surgical History  Procedure Laterality Date  . Knee surgery      I&D- 2008, post laceration   . Colonoscopy  05/16/2011    Procedure: COLONOSCOPY;  Surgeon: Winfield Cunas., MD;  Location: [facility];  Service: Endoscopy;  Laterality: N/A;  . Small intestine surgery  1992, 1999  . Laparotomy and lysis of adhesions    . Total hip arthroplasty  10/25/2011    Procedure: TOTAL HIP ARTHROPLASTY;  Surgeon: Garald Balding, MD;  Location: [facility];  Service: Orthopedics;  Laterality: Left;  . Appendectomy  1962  . Abdominal hysterectomy  1988  . Partial colectomy and colostomy  sept 2013    mucous fistula done  . Partial colectomy  05/17/2012    Procedure: PARTIAL COLECTOMY;  Surgeon: Odis Hollingshead, MD;  Location: [facility];  Service: General;  Laterality: N/A;  . Colostomy closure  05/17/2012    Procedure: COLOSTOMY CLOSURE;  Surgeon: Odis Hollingshead, MD;  Location: [facility];  Service: General;  Laterality: N/A;  . Laparotomy  05/17/2012    Procedure: EXPLORATORY LAPAROTOMY;  Surgeon: Odis Hollingshead, MD;  Location: [facility];  Service: General;;  . Lysis of adhesion  05/17/2012    Procedure: LYSIS OF ADHESION;  Surgeon: Odis Hollingshead, MD;  Location: [facility];  Service: General;;  . Esophageal biopsy  05/17/2012    Procedure: BIOPSY;  Surgeon: Odis Hollingshead, MD;  Location: [facility];  Service: General;;  omental biopsy  . Laparotomy  05/22/2012    Procedure: EXPLORATORY LAPAROTOMY;  Surgeon: Odis Hollingshead, MD;  Location: [facility];  Service: General;  Laterality: N/A;  DRAINAGE  INTRA-ABDOMINAL ABSCESS/LYSIS OF ADHESIONSFOR SMALL BOWEL OBSTRUCTION/DIVERTING LOOP ILEOSTOMY  . Tee without cardioversion N/A 11/25/2013    Procedure: TRANSESOPHAGEAL ECHOCARDIOGRAM (TEE);  Surgeon: Dorothy Spark, MD;  Location[facility];  Service: Cardiovascular;  Laterality: N/A;      Current Outpatient Prescriptions  Medication Sig Dispense Refill  . albuterol (PROVENTIL HFA;VENTOLIN HFA) 108 (90 BASE) MCG/ACT inhaler Inhale 1-2 puffs into the lungs every 6 (six) hours as needed for wheezing or shortness of breath.     Marland Kitchen aspirin 325 MG EC tablet Take 325 mg by mouth every morning.     . bisoprolol (ZEBETA) 5 MG tablet TAKE 1 TABLET BY MOUTH ONCE DAILY BEFORE BREAKFAST 90 tablet 0  . buPROPion (WELLBUTRIN XL) 300 MG 24 hr tablet Take 1 tablet (300 mg total) by mouth daily before breakfast. 90 tablet 3  . celecoxib (CELEBREX) 200 MG capsule Take 200 mg by mouth daily.     . cyclobenzaprine (FLEXERIL) 10 MG tablet Take 10 mg by mouth 3 (three) times daily as needed for muscle spasms.     Marland Kitchen diltiazem (CARDIZEM CD) 120 MG 24 hr capsule Take 1 capsule (120 mg  total) by mouth daily. 90 capsule 3  . diphenoxylate-atropine (LOMOTIL) 2.5-0.025 MG per tablet     . estradiol (ESTRACE) 2 MG tablet Take 2 mg by mouth daily before breakfast.     . furosemide (LASIX) 40 MG tablet Take 40 mg by mouth daily as needed.    Marland Kitchen HYDROcodone-acetaminophen (NORCO) 10-325 MG per tablet     . levofloxacin (LEVAQUIN) 500 MG tablet Take 1 tablet (500 mg total) by mouth daily. 7 tablet 0  . loratadine-pseudoephedrine (CLARITIN-D 12-HOUR) 5-120 MG per tablet Take 1 tablet by mouth daily as needed for allergies.     . magnesium oxide (MAG-OX) 400 (241.3 MG) MG tablet Take 1 tablet (400 mg total) by mouth 2 (two) times daily. 60 tablet 0  . NEXIUM 40 MG capsule     . ondansetron (ZOFRAN) 4 MG tablet Take 4 mg by mouth every 8 (eight) hours as needed for nausea or vomiting.    . potassium chloride 20 MEQ TBCR Take 20 mEq by mouth 3 (three) times daily. 21 tablet 0  . promethazine (PHENERGAN) 25 MG tablet Take 25 mg by mouth every 6 (six) hours as needed for nausea or vomiting (nausea).    . RABEprazole (ACIPHEX) 20 MG tablet Take 20 mg by mouth daily.  0   No current facility-administered medications  for this visit.    Allergies:   Vancomycin; Ativan; Codeine; Tetanus toxoids; Penicillins; and Xarelto    Social History:  The patient  reports that she quit smoking about 14 months ago. Her smoking use included Cigarettes. She has a 5 pack-year smoking history. She has never used smokeless tobacco. She reports that she does not drink alcohol or use illicit drugs.   Family History:  The patient's family history includes Atrial fibrillation in her father; Hypertension in her mother; Stroke in her father.    ROS:  Please see the history of present illness.   Otherwise, review of systems are positive for none.   All other systems are reviewed and negative.    PHYSICAL EXAM: VS:  BP 126/78 mmHg  Pulse 76  Ht 5\' 5"  (1.651 m)  Wt 130 lb 12.8 oz (59.33 kg)  BMI 21.77 kg/m2 , BMI Body mass index is 21.77 kg/(m^2). GEN: Well nourished, well developed, in no acute distress HEENT: normal Neck: no JVD, carotid bruits, or masses Cardiac: RRR; no murmurs, rubs, or gallops,no edema  Respiratory:  clear to auscultation bilaterally, normal work of breathing GI: soft, nontender, nondistended, + BS MS: no deformity or atrophy Skin: warm and dry, no rash Neuro:  Strength and sensation are intact Psych: euthymic mood, full affect   EKG:  EKG is ordered today. The ekg ordered today demonstrates normal sinus rhythm.  Within normal limits.   Recent Labs: 12/14/2013: ALT 10 12/22/2013: BUN 9; Creatinine 0.74; Magnesium 1.7; Potassium 3.6*; Sodium 137 05/15/2014: Hemoglobin 9.2*; Platelets 240    Lipid Panel    Component Value Date/Time   CHOL 108 05/28/2012 0420   TRIG 105 05/28/2012 0420   HDL 92.50 12/15/2010 1144   CHOLHDL 2 12/15/2010 1144   VLDL 13.6 12/15/2010 1144   LDLCALC * 07/20/2007 0327    115        Total Cholesterol/HDL:CHD Risk Coronary Heart Disease Risk Table                     Men   Women  1/2 Average Risk   3.4   3.3   LDLDIRECT 105.6  12/15/2010 1144      Wt  Readings from Last 3 Encounters:  05/27/14 130 lb 12.8 oz (59.33 kg)  04/10/14 129 lb 12.8 oz (58.877 kg)  02/17/14 119 lb 11.2 oz (54.296 kg)      Other studies Reviewed:   ASSESSMENT AND PLAN:  Paroxysmal atrial fibrillation, resolved. She had an echocardiogram on 11/19/13 showing ejection fraction 60-65%. We will continue current dose of diltiazem CD 120 mg daily and Zebeta 5 mg daily Hypotension, resolved  Chronic hyponatremia  CMML (chronic myelomonocytic leukemia) with anemia.  She is now receiving Epogen shots every 2 weeks through the cancer center. COPD followed closely by Dr. Baird Lyons   Current medicines are reviewed at length with the patient today.  The patient does not have concerns regarding medicines.  The following changes have been made:  no change  Labs/ tests ordered today include: None   Orders Placed This Encounter  Procedures  . EKG 12-Lead     Disposition:   FU with Dr. Mare Ferrari in 4 months   Signed, Darlin Coco, MD  05/27/2014 1:01 PM    [facility] [address], [place], [place]  [address] Phone: [phone]; Fax: [phone]

## 2014-05-29 ENCOUNTER — Telehealth: Payer: Self-pay | Admitting: Hematology and Oncology

## 2014-05-29 ENCOUNTER — Other Ambulatory Visit: Payer: 59

## 2014-05-29 ENCOUNTER — Ambulatory Visit: Payer: 59

## 2014-05-29 ENCOUNTER — Telehealth: Payer: Self-pay

## 2014-05-29 NOTE — Telephone Encounter (Signed)
Pt called stating she tried to get scheduling and could not get connected. She missed today lab and injection and wants to come in tomorrow. Of note, she has a blood transfusion appt slot tomorrow at 11am. POF sent for lab and inj appt before this 11 am blood appt.

## 2014-05-29 NOTE — Telephone Encounter (Signed)
[  er pof to r/s pt appt-cld & left pt a message of r/s time & date

## 2014-05-30 ENCOUNTER — Other Ambulatory Visit (HOSPITAL_BASED_OUTPATIENT_CLINIC_OR_DEPARTMENT_OTHER): Payer: 59

## 2014-05-30 ENCOUNTER — Ambulatory Visit (HOSPITAL_BASED_OUTPATIENT_CLINIC_OR_DEPARTMENT_OTHER): Payer: 59

## 2014-05-30 DIAGNOSIS — E860 Dehydration: Secondary | ICD-10-CM

## 2014-05-30 DIAGNOSIS — D638 Anemia in other chronic diseases classified elsewhere: Secondary | ICD-10-CM

## 2014-05-30 DIAGNOSIS — D630 Anemia in neoplastic disease: Secondary | ICD-10-CM

## 2014-05-30 DIAGNOSIS — Z932 Ileostomy status: Secondary | ICD-10-CM

## 2014-05-30 DIAGNOSIS — C9310 Chronic myelomonocytic leukemia not having achieved remission: Secondary | ICD-10-CM

## 2014-05-30 LAB — CBC & DIFF AND RETIC
BASO%: 0.3 % (ref 0.0–2.0)
BASOS ABS: 0 10*3/uL (ref 0.0–0.1)
EOS%: 0.6 % (ref 0.0–7.0)
Eosinophils Absolute: 0 10*3/uL (ref 0.0–0.5)
HCT: 29.6 % — ABNORMAL LOW (ref 34.8–46.6)
HGB: 9.1 g/dL — ABNORMAL LOW (ref 11.6–15.9)
Immature Retic Fract: 9.5 % (ref 1.60–10.00)
LYMPH%: 27.5 % (ref 14.0–49.7)
MCH: 31.6 pg (ref 25.1–34.0)
MCHC: 30.7 g/dL — AB (ref 31.5–36.0)
MCV: 102.8 fL — ABNORMAL HIGH (ref 79.5–101.0)
MONO#: 2.5 10*3/uL — ABNORMAL HIGH (ref 0.1–0.9)
MONO%: 35.6 % — ABNORMAL HIGH (ref 0.0–14.0)
NEUT#: 2.5 10*3/uL (ref 1.5–6.5)
NEUT%: 36 % — ABNORMAL LOW (ref 38.4–76.8)
PLATELETS: 222 10*3/uL (ref 145–400)
RBC: 2.88 10*6/uL — AB (ref 3.70–5.45)
RDW: 23 % — ABNORMAL HIGH (ref 11.2–14.5)
RETIC %: 1.98 % (ref 0.70–2.10)
RETIC CT ABS: 57.02 10*3/uL (ref 33.70–90.70)
WBC: 6.9 10*3/uL (ref 3.9–10.3)
lymph#: 1.9 10*3/uL (ref 0.9–3.3)

## 2014-05-30 LAB — TECHNOLOGIST REVIEW

## 2014-05-30 LAB — HOLD TUBE, BLOOD BANK

## 2014-05-30 MED ORDER — DARBEPOETIN ALFA 500 MCG/ML IJ SOSY
500.0000 ug | PREFILLED_SYRINGE | Freq: Once | INTRAMUSCULAR | Status: AC
  Administered 2014-05-30: 500 ug via SUBCUTANEOUS
  Filled 2014-05-30: qty 1

## 2014-06-09 ENCOUNTER — Ambulatory Visit: Payer: BC Managed Care – PPO | Admitting: Hematology and Oncology

## 2014-06-09 ENCOUNTER — Other Ambulatory Visit: Payer: BC Managed Care – PPO

## 2014-06-09 ENCOUNTER — Ambulatory Visit: Payer: BC Managed Care – PPO

## 2014-06-12 ENCOUNTER — Encounter: Payer: Self-pay | Admitting: Internal Medicine

## 2014-06-12 ENCOUNTER — Ambulatory Visit (HOSPITAL_BASED_OUTPATIENT_CLINIC_OR_DEPARTMENT_OTHER): Payer: 59 | Admitting: Hematology and Oncology

## 2014-06-12 ENCOUNTER — Ambulatory Visit (INDEPENDENT_AMBULATORY_CARE_PROVIDER_SITE_OTHER): Payer: 59 | Admitting: Internal Medicine

## 2014-06-12 ENCOUNTER — Telehealth: Payer: Self-pay | Admitting: Hematology and Oncology

## 2014-06-12 ENCOUNTER — Ambulatory Visit (HOSPITAL_BASED_OUTPATIENT_CLINIC_OR_DEPARTMENT_OTHER): Payer: 59

## 2014-06-12 ENCOUNTER — Encounter: Payer: Self-pay | Admitting: Hematology and Oncology

## 2014-06-12 ENCOUNTER — Other Ambulatory Visit (HOSPITAL_BASED_OUTPATIENT_CLINIC_OR_DEPARTMENT_OTHER): Payer: 59

## 2014-06-12 ENCOUNTER — Other Ambulatory Visit: Payer: Self-pay | Admitting: *Deleted

## 2014-06-12 VITALS — BP 113/74 | HR 77 | Temp 97.1°F | Resp 18 | Ht 65.0 in | Wt 129.9 lb

## 2014-06-12 VITALS — BP 114/60 | HR 82 | Wt 129.0 lb

## 2014-06-12 DIAGNOSIS — E538 Deficiency of other specified B group vitamins: Secondary | ICD-10-CM

## 2014-06-12 DIAGNOSIS — C9310 Chronic myelomonocytic leukemia not having achieved remission: Secondary | ICD-10-CM

## 2014-06-12 DIAGNOSIS — J189 Pneumonia, unspecified organism: Secondary | ICD-10-CM

## 2014-06-12 DIAGNOSIS — D630 Anemia in neoplastic disease: Secondary | ICD-10-CM

## 2014-06-12 DIAGNOSIS — E860 Dehydration: Secondary | ICD-10-CM

## 2014-06-12 DIAGNOSIS — Z932 Ileostomy status: Secondary | ICD-10-CM

## 2014-06-12 DIAGNOSIS — J441 Chronic obstructive pulmonary disease with (acute) exacerbation: Secondary | ICD-10-CM

## 2014-06-12 DIAGNOSIS — K219 Gastro-esophageal reflux disease without esophagitis: Secondary | ICD-10-CM

## 2014-06-12 DIAGNOSIS — D638 Anemia in other chronic diseases classified elsewhere: Secondary | ICD-10-CM

## 2014-06-12 LAB — CBC WITH DIFFERENTIAL/PLATELET
BASO%: 0.4 % (ref 0.0–2.0)
BASOS ABS: 0 10*3/uL (ref 0.0–0.1)
EOS%: 0.5 % (ref 0.0–7.0)
Eosinophils Absolute: 0.1 10*3/uL (ref 0.0–0.5)
HEMATOCRIT: 31 % — AB (ref 34.8–46.6)
HGB: 9.6 g/dL — ABNORMAL LOW (ref 11.6–15.9)
LYMPH%: 21.8 % (ref 14.0–49.7)
MCH: 31.5 pg (ref 25.1–34.0)
MCHC: 31 g/dL — AB (ref 31.5–36.0)
MCV: 101.6 fL — AB (ref 79.5–101.0)
MONO#: 3.3 10*3/uL — ABNORMAL HIGH (ref 0.1–0.9)
MONO%: 31 % — AB (ref 0.0–14.0)
NEUT#: 4.9 10*3/uL (ref 1.5–6.5)
NEUT%: 46.3 % (ref 38.4–76.8)
Platelets: 226 10*3/uL (ref 145–400)
RBC: 3.05 10*6/uL — ABNORMAL LOW (ref 3.70–5.45)
RDW: 22.2 % — ABNORMAL HIGH (ref 11.2–14.5)
WBC: 10.5 10*3/uL — ABNORMAL HIGH (ref 3.9–10.3)
lymph#: 2.3 10*3/uL (ref 0.9–3.3)

## 2014-06-12 LAB — TECHNOLOGIST REVIEW

## 2014-06-12 LAB — HOLD TUBE, BLOOD BANK

## 2014-06-12 MED ORDER — AZELASTINE-FLUTICASONE 137-50 MCG/ACT NA SUSP: 2.0000 | Freq: Every day | NASAL | Status: AC

## 2014-06-12 MED ORDER — DARBEPOETIN ALFA 500 MCG/ML IJ SOSY
500.0000 ug | PREFILLED_SYRINGE | Freq: Once | INTRAMUSCULAR | Status: AC
  Administered 2014-06-12: 500 ug via SUBCUTANEOUS
  Filled 2014-06-12: qty 1

## 2014-06-12 NOTE — Patient Instructions (Signed)
Try using the rescue inhaler 2 puffs twice daily for a little while, o see if you can keep your pipes clearer  Suggest you elevate the head of your bed by putting a brick under each of the head legs. That little bit of tilt will help keep any reflux down at night.  Sample trial Dymista nasal spray  2 puffs each nostril once daily at bedtime   You can increase this to twice daily if needed  Walking to build stamina will help

## 2014-06-12 NOTE — Progress Notes (Signed)
04/10/14- 58 yoF former smoker, former Designer, [employer]ell[employer],  Self referral-multiple bouts of PNA over past 3 years; former patient of [facility] Hx CMML, chronic bronchitis, AFib, C-diff. She has been followed conservatively for her chronic myelomonocytic leukemia without chemotherapy. Now on Epogen for anemia. Occasional transfusion. History of seasonal rhinitis especially in the fall. 3 years ago Norovirus infectrion was complicated by ischemic colitis and she eventually required total colectomy. Significant weight loss and repeated pneumonias. Last hospitalized August 2015 for dehydration, paroxysmal atrial fibrillation and C.diff. 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 She does not think she has been refluxing or aspirating. She has had pneumovax-23 about 3 years ago and had flu and Prevnar 13 vaccines in 2015. She notices shortness of breath mostly when she is anemic. She denies routine cough or wheeze or phlegm.hmed  06/12/14- 22 yoF former smoker, former Designer, [employer],  Self referral-multiple bouts of PNA over past 3 years/ Chronic bronchitis; complicated by AFib, CMML, ischemic colitis.  Here with friend allergy issues; runny nose (clear) SOB w/activity; prod cough at times w/clear mucus WBC 6,900 with adequate diff Immunoglobulins OK 04/11/15 SPEP- No spike Diptheria and Tetanus toxin antibody- 0.63- OK Patient reports another pneumonia around Xmas, Rx'd w/ levaquin by oncologist. WBC went up to 24K. Now at baseline. Unaware of reflux. C/O watery rhinorhea despite claritin.  DOE w/ ADLs, usually no wheeze so not using her rescue HFA. CXR 04/10/14 IMPRESSION: Patchy somewhat nodular airspace opacities are present at both lung bases, improved from radiographs of 4 months ago whether these are related to resolving or recurrent inflammation is unclear. Consider follow-up CT for further evaluation. Electronically Signed  By: Camie Patience M.D.  On: 04/10/2014 12:17  ROS-see  HPI Constitutional:   No-   weight loss, night sweats, fevers, chills, fatigue, lassitude. HEENT:   No-  headaches, difficulty swallowing, tooth/dental problems, sore throat,       No-  sneezing, itching, ear ache, nasal congestion, +post nasal drip,  CV:  No-   chest pain, orthopnea, PND, swelling in lower extremities, anasarca,                                  dizziness, palpitations Resp: No-  + shortness of breath with exertion or at rest.              No-   productive cough,  No non-productive cough,  No- coughing up of blood.              No-   change in color of mucus.  No- wheezing.   Skin: No-   rash or lesions. GI:  No-   heartburn, indigestion, abdominal pain, nausea, vomiting, GU: . MS:  No-   joint pain or swelling. . Neuro-     nothing unusual Psych:  No- change in mood or affect. No depression or anxiety.  No memory loss.  OBJ- Physical Exam General- Alert, Oriented, Affect-appropriate, Distress- none acute Skin- rash-none, lesions- none, excoriation- none Lymphadenopathy- none Head- atraumatic            Eyes- Gross vision intact, PERRLA, conjunctivae and secretions clear            Ears- Hearing, canals-normal            Nose- Clear, no-Septal dev, mucus, polyps, erosion, perforation             Throat- Mallampati II , mucosa clear ,  drainage- none, tonsils- atrophic Neck- flexible , trachea midline, no stridor , thyroid nl, carotid no bruit Chest - symmetrical excursion , unlabored           Heart/CV- RRR , no murmur , no gallop  , no rub, nl s1 s2                           - JVD- none , edema- none, stasis changes- none, varices- none           Lung- + bilateral mild rhonchi, wheeze- none, cough- none , dullness-none, rub- none           Chest wall-  Abd- + ileostomy Br/ Gen/ Rectal- Not done, not indicated Extrem- cyanosis- none, clubbing, none, atrophy- none, strength- nl Neuro- grossly intact to observation    .

## 2014-06-12 NOTE — Assessment & Plan Note (Addendum)
She has had recurrent pneumonias. Labs don't reveal obvious immunodeficiency. It may be from her CMML, but discussed acid blockers. Also recognized possible recurrent aspiration. Plan- emphasize reflux precautions.

## 2014-06-12 NOTE — Assessment & Plan Note (Signed)
Plan- try regular use of rescue inhaler for a few days to clear rhonchi. Consider use of Flutter,

## 2014-06-12 NOTE — Assessment & Plan Note (Signed)
GERD not confirmed and not symptomatic, but she is high risk with repeated pneumonias Plan- Reflux precautions including elevate HOB with brick

## 2014-06-12 NOTE — Telephone Encounter (Signed)
Pt confirmed labs/ov/inj per 02/25 POF, gave pt AVS.... KJ

## 2014-06-13 NOTE — Progress Notes (Signed)
[name] OFF[facility] PROGRESS NOTE  Patient Care Team: Darlin Coco, MD as PCP - General (Cardiology) Winfield Cunas., MD (Gastroenterology) Garald Balding, MD (Orthopedic Surgery) Jackolyn Confer, MD as Attending Physician (General Surgery) Heath Lark, MD as Consulting Physician (Hematology and Oncology)  SUMMARY OF ONCOLOGIC HISTORY:   CMML (chronic myelomonocytic leukemia)   11/17/2011 Initial Diagnosis CMML (chronic myelomonocytic leukemia)   This patient had background history of CMML and vitamin B-12 deficiency. She also have recurrent hospitalization related to high output ileostomy and complication related to dehydration and renal failure. She also required numerous blood transfusions in the past. The patient was started on Darbopoetin injection on 01/31/2014. She continued to feel weak, shortness of breath with minimal exertion. The patient denies any recent signs or symptoms of bleeding such as spontaneous epistaxis, hematuria or hematochezia. She continued to have diffuse output from her ileostomy, unchanged compared to baseline. She has chronic nonproductive cough. REVIEW OF SYSTEMS:   Constitutional: Denies fevers, chills or abnormal weight loss Eyes: Denies blurriness of vision Ears, nose, mouth, throat, and face: Denies mucositis or sore throat Cardiovascular: Denies palpitation, chest discomfort or lower extremity swelling Gastrointestinal:  Denies nausea, heartburn or change in bowel habits Skin: Denies abnormal skin rashes Lymphatics: Denies new lymphadenopathy  Neurological:Denies numbness, tingling or new weaknesses Behavioral/Psych: Mood is stable, no new changes  All other systems were reviewed with the patient and are negative.  I have reviewed the past medical history, past surgical history, social history and family history with the patient and they are unchanged from previous note.  ALLERGIES:  is allergic to vancomycin; ativan;  codeine; tetanus toxoids; penicillins; and xarelto.  MEDICATIONS:  Current Outpatient Prescriptions  Medication Sig Dispense Refill  . albuterol (PROVENTIL HFA;VENTOLIN HFA) 108 (90 BASE) MCG/ACT inhaler Inhale 1-2 puffs into the lungs every 6 (six) hours as needed for wheezing or shortness of breath.     Marland Kitchen aspirin 325 MG EC tablet Take 325 mg by mouth every morning.     . bisoprolol (ZEBETA) 5 MG tablet TAKE 1 TABLET BY MOUTH ONCE DAILY BEFORE BREAKFAST 90 tablet 0  . buPROPion (WELLBUTRIN XL) 300 MG 24 hr tablet Take 1 tablet (300 mg total) by mouth daily before breakfast. 90 tablet 3  . celecoxib (CELEBREX) 200 MG capsule Take 200 mg by mouth daily.     Marland Kitchen diltiazem (CARDIZEM CD) 120 MG 24 hr capsule Take 1 capsule (120 mg total) by mouth daily. 90 capsule 3  . diphenoxylate-atropine (LOMOTIL) 2.5-0.025 MG per tablet     . estradiol (ESTRACE) 2 MG tablet Take 2 mg by mouth daily before breakfast.     . loratadine-pseudoephedrine (CLARITIN-D 12-HOUR) 5-120 MG per tablet Take 1 tablet by mouth daily as needed for allergies.     Marland Kitchen ondansetron (ZOFRAN) 4 MG tablet Take 4 mg by mouth every 8 (eight) hours as needed for nausea or vomiting.    . RABEprazole (ACIPHEX) 20 MG tablet Take 20 mg by mouth daily.  0  . Azelastine-Fluticasone (DYMISTA) 137-50 MCG/ACT SUSP Place 2 sprays into both nostrils at bedtime. 1 Bottle 0  . cyclobenzaprine (FLEXERIL) 10 MG tablet Take 10 mg by mouth 3 (three) times daily as needed for muscle spasms.     . furosemide (LASIX) 40 MG tablet Take 40 mg by mouth daily as needed.    Marland Kitchen HYDROcodone-acetaminophen (NORCO) 10-325 MG per tablet     . magnesium oxide (MAG-OX) 400 (241.3 MG) MG tablet Take 1  tablet (400 mg total) by mouth 2 (two) times daily. (Patient not taking: Reported on 06/12/2014) 60 tablet 0  . potassium chloride 20 MEQ TBCR Take 20 mEq by mouth 3 (three) times daily. (Patient not taking: Reported on 06/12/2014) 21 tablet 0  . promethazine (PHENERGAN) 25 MG  tablet Take 25 mg by mouth every 6 (six) hours as needed for nausea or vomiting (nausea).     No current facility-administered medications for this visit.    PHYSICAL EXAMINATION: ECOG PERFORMANCE STATUS: 1 - Symptomatic but completely ambulatory  Filed Vitals:   06/12/14 1303  BP: 113/74  Pulse: 77  Temp: 97.1 F (36.2 C)  Resp: 18   Filed Weights   06/12/14 1303  Weight: 129 lb 14.4 oz (58.922 kg)    GENERAL:alert, no distress and comfortable SKIN: skin color, texture, turgor are normal, no rashes or significant lesions EYES: normal, Conjunctiva are pale and non-injected, sclera clear Musculoskeletal:no cyanosis of digits and no clubbing  NEURO: alert & oriented x 3 with fluent speech, no focal motor/sensory deficits  LABORATORY DATA:  I have reviewed the data as listed    Component Value Date/Time   NA 137 12/22/2013 0410   NA 133* 08/28/2013 1211   K 3.6* 12/22/2013 0410   K 3.7 08/28/2013 1211   CL 97 12/22/2013 0410   CL 101 02/09/2012 1321   CO2 29 12/22/2013 0410   CO2 16* 08/28/2013 1211   GLUCOSE 109* 12/22/2013 0410   GLUCOSE 79 08/28/2013 1211   GLUCOSE 79 02/09/2012 1321   BUN 9 12/22/2013 0410   BUN 8.8 08/28/2013 1211   CREATININE 0.74 12/22/2013 0410   CREATININE 0.8 08/28/2013 1211   CREATININE 0.92 08/10/2012 1528   CALCIUM 7.2* 12/22/2013 0410   CALCIUM 5.3* 11/19/2013 1000   CALCIUM 6.7* 08/28/2013 1211   PROT 5.7* 12/14/2013 0215   PROT 6.9 08/28/2013 1211   ALBUMIN 1.8* 12/22/2013 0410   ALBUMIN 2.4* 08/28/2013 1211   AST 17 12/14/2013 0215   AST 19 08/28/2013 1211   ALT 10 12/14/2013 0215   ALT 14 08/28/2013 1211   ALKPHOS 306* 12/14/2013 0215   ALKPHOS 495* 08/28/2013 1211   BILITOT 0.3 12/14/2013 0215   BILITOT 0.37 08/28/2013 1211   GFRNONAA 88* 12/22/2013 0410   GFRAA >90 12/22/2013 0410    No results found for: SPEP, UPEP  Lab Results  Component Value Date   WBC 10.5* 06/12/2014   NEUTROABS 4.9 06/12/2014   HGB 9.6*  06/12/2014   HCT 31.0* 06/12/2014   MCV 101.6* 06/12/2014   PLT 226 06/12/2014      Chemistry      Component Value Date/Time   NA 137 12/22/2013 0410   NA 133* 08/28/2013 1211   K 3.6* 12/22/2013 0410   K 3.7 08/28/2013 1211   CL 97 12/22/2013 0410   CL 101 02/09/2012 1321   CO2 29 12/22/2013 0410   CO2 16* 08/28/2013 1211   BUN 9 12/22/2013 0410   BUN 8.8 08/28/2013 1211   CREATININE 0.74 12/22/2013 0410   CREATININE 0.8 08/28/2013 1211   CREATININE 0.92 08/10/2012 1528      Component Value Date/Time   CALCIUM 7.2* 12/22/2013 0410   CALCIUM 5.3* 11/19/2013 1000   CALCIUM 6.7* 08/28/2013 1211   ALKPHOS 306* 12/14/2013 0215   ALKPHOS 495* 08/28/2013 1211   AST 17 12/14/2013 0215   AST 19 08/28/2013 1211   ALT 10 12/14/2013 0215   ALT 14 08/28/2013 1211   BILITOT  0.3 12/14/2013 0215   BILITOT 0.37 08/28/2013 1211       ASSESSMENT & PLAN:  CMML (chronic myelomonocytic leukemia) I discussed with her the risks, benefits and side effects of erythropoietin stimulating agents for treatment of anemia. She agreed to proceed. We have tried Aranesp as 500 mcg every other week with goal of hemoglobin greater than 10 and to reduce transfusion requirements. She is doing well. We will continue same treatment without dose adjustment.      B12 deficiency Her last serum B-12 level in August 2015 was adequate. We will continue close monitoring. She takes oral B-12 supplement.    Orders Placed This Encounter  Procedures  . CBC with Differential/Platelet    Standing Status: Standing     Number of Occurrences: 22     Standing Expiration Date: 06/13/2015  . Vitamin B12    Standing Status: Future     Number of Occurrences:      Standing Expiration Date: 07/17/2015  . Ferritin    Standing Status: Future     Number of Occurrences:      Standing Expiration Date: 07/17/2015  . Iron and TIBC    Standing Status: Future     Number of Occurrences:      Standing Expiration Date:  07/17/2015   All questions were answered. The patient knows to call the clinic with any problems, questions or concerns. No barriers to learning was detected. I spent 15 minutes counseling the patient face to face. The total time spent in the appointment was 20 minutes and more than 50% was on counseling and review of test results     [facility], [place], MD 06/13/2014 2:24 PM

## 2014-06-13 NOTE — Assessment & Plan Note (Signed)
Her last serum B-12 level in August 2015 was adequate. We will continue close monitoring. She takes oral B-12 supplement.

## 2014-06-13 NOTE — Assessment & Plan Note (Signed)
I discussed with her the risks, benefits and side effects of erythropoietin stimulating agents for treatment of anemia. She agreed to proceed. We have tried Aranesp as 500 mcg every other week with goal of hemoglobin greater than 10 and to reduce transfusion requirements. She is doing well. We will continue same treatment without dose adjustment.

## 2014-06-26 ENCOUNTER — Ambulatory Visit (HOSPITAL_BASED_OUTPATIENT_CLINIC_OR_DEPARTMENT_OTHER): Payer: 59

## 2014-06-26 ENCOUNTER — Other Ambulatory Visit (HOSPITAL_BASED_OUTPATIENT_CLINIC_OR_DEPARTMENT_OTHER): Payer: 59

## 2014-06-26 DIAGNOSIS — E860 Dehydration: Secondary | ICD-10-CM

## 2014-06-26 DIAGNOSIS — C9310 Chronic myelomonocytic leukemia not having achieved remission: Secondary | ICD-10-CM | POA: Diagnosis not present

## 2014-06-26 DIAGNOSIS — E538 Deficiency of other specified B group vitamins: Secondary | ICD-10-CM

## 2014-06-26 DIAGNOSIS — Z932 Ileostomy status: Secondary | ICD-10-CM

## 2014-06-26 DIAGNOSIS — D630 Anemia in neoplastic disease: Secondary | ICD-10-CM

## 2014-06-26 DIAGNOSIS — D638 Anemia in other chronic diseases classified elsewhere: Secondary | ICD-10-CM

## 2014-06-26 LAB — CBC WITH DIFFERENTIAL/PLATELET
BASO%: 0.5 % (ref 0.0–2.0)
BASOS ABS: 0.1 10*3/uL (ref 0.0–0.1)
EOS ABS: 0.1 10*3/uL (ref 0.0–0.5)
EOS%: 0.7 % (ref 0.0–7.0)
HCT: 30.4 % — ABNORMAL LOW (ref 34.8–46.6)
HGB: 9.5 g/dL — ABNORMAL LOW (ref 11.6–15.9)
LYMPH%: 23.9 % (ref 14.0–49.7)
MCH: 31.7 pg (ref 25.1–34.0)
MCHC: 31.3 g/dL — ABNORMAL LOW (ref 31.5–36.0)
MCV: 101.3 fL — ABNORMAL HIGH (ref 79.5–101.0)
MONO#: 3.6 10*3/uL — AB (ref 0.1–0.9)
MONO%: 39.1 % — AB (ref 0.0–14.0)
NEUT#: 3.3 10*3/uL (ref 1.5–6.5)
NEUT%: 35.8 % — ABNORMAL LOW (ref 38.4–76.8)
PLATELETS: 84 10*3/uL — AB (ref 145–400)
RBC: 3 10*6/uL — AB (ref 3.70–5.45)
RDW: 20.9 % — AB (ref 11.2–14.5)
WBC: 9.1 10*3/uL (ref 3.9–10.3)
lymph#: 2.2 10*3/uL (ref 0.9–3.3)
nRBC: 1 % — ABNORMAL HIGH (ref 0–0)

## 2014-06-26 LAB — TECHNOLOGIST REVIEW

## 2014-06-26 MED ORDER — DARBEPOETIN ALFA 500 MCG/ML IJ SOSY
500.0000 ug | PREFILLED_SYRINGE | Freq: Once | INTRAMUSCULAR | Status: AC
  Administered 2014-06-26: 500 ug via SUBCUTANEOUS
  Filled 2014-06-26: qty 1

## 2014-07-10 ENCOUNTER — Ambulatory Visit: Payer: 59

## 2014-07-10 ENCOUNTER — Other Ambulatory Visit (HOSPITAL_BASED_OUTPATIENT_CLINIC_OR_DEPARTMENT_OTHER): Payer: 59

## 2014-07-10 DIAGNOSIS — C9310 Chronic myelomonocytic leukemia not having achieved remission: Secondary | ICD-10-CM | POA: Diagnosis not present

## 2014-07-10 DIAGNOSIS — D638 Anemia in other chronic diseases classified elsewhere: Secondary | ICD-10-CM

## 2014-07-10 DIAGNOSIS — E860 Dehydration: Secondary | ICD-10-CM

## 2014-07-10 DIAGNOSIS — E538 Deficiency of other specified B group vitamins: Secondary | ICD-10-CM

## 2014-07-10 DIAGNOSIS — Z932 Ileostomy status: Secondary | ICD-10-CM

## 2014-07-10 LAB — CBC WITH DIFFERENTIAL/PLATELET
BASO%: 0.3 % (ref 0.0–2.0)
BASOS ABS: 0 10*3/uL (ref 0.0–0.1)
EOS%: 0.6 % (ref 0.0–7.0)
Eosinophils Absolute: 0.1 10*3/uL (ref 0.0–0.5)
HEMATOCRIT: 34.1 % — AB (ref 34.8–46.6)
HGB: 10.4 g/dL — ABNORMAL LOW (ref 11.6–15.9)
LYMPH%: 21.5 % (ref 14.0–49.7)
MCH: 31.3 pg (ref 25.1–34.0)
MCHC: 30.5 g/dL — AB (ref 31.5–36.0)
MCV: 102.7 fL — ABNORMAL HIGH (ref 79.5–101.0)
MONO#: 3.1 10*3/uL — ABNORMAL HIGH (ref 0.1–0.9)
MONO%: 31 % — AB (ref 0.0–14.0)
NEUT#: 4.7 10*3/uL (ref 1.5–6.5)
NEUT%: 46.6 % (ref 38.4–76.8)
Platelets: 231 10*3/uL (ref 145–400)
RBC: 3.32 10*6/uL — ABNORMAL LOW (ref 3.70–5.45)
RDW: 21 % — AB (ref 11.2–14.5)
WBC: 10.1 10*3/uL (ref 3.9–10.3)
lymph#: 2.2 10*3/uL (ref 0.9–3.3)

## 2014-07-10 LAB — TECHNOLOGIST REVIEW

## 2014-07-10 MED ORDER — DARBEPOETIN ALFA 500 MCG/ML IJ SOSY: 500.0000 ug | PREFILLED_SYRINGE | Freq: Once | INTRAMUSCULAR | Status: DC

## 2014-07-16 ENCOUNTER — Other Ambulatory Visit: Payer: Self-pay | Admitting: Gastroenterology

## 2014-07-16 DIAGNOSIS — K56600 Partial intestinal obstruction, unspecified as to cause: Secondary | ICD-10-CM

## 2014-07-21 ENCOUNTER — Inpatient Hospital Stay: Admission: RE | Admit: 2014-07-21 | Payer: 59 | Source: Ambulatory Visit

## 2014-07-24 ENCOUNTER — Other Ambulatory Visit (HOSPITAL_BASED_OUTPATIENT_CLINIC_OR_DEPARTMENT_OTHER): Payer: 59

## 2014-07-24 ENCOUNTER — Ambulatory Visit (HOSPITAL_BASED_OUTPATIENT_CLINIC_OR_DEPARTMENT_OTHER): Payer: 59

## 2014-07-24 DIAGNOSIS — C9310 Chronic myelomonocytic leukemia not having achieved remission: Secondary | ICD-10-CM | POA: Diagnosis not present

## 2014-07-24 DIAGNOSIS — D638 Anemia in other chronic diseases classified elsewhere: Secondary | ICD-10-CM

## 2014-07-24 DIAGNOSIS — D630 Anemia in neoplastic disease: Secondary | ICD-10-CM

## 2014-07-24 DIAGNOSIS — E538 Deficiency of other specified B group vitamins: Secondary | ICD-10-CM

## 2014-07-24 DIAGNOSIS — Z932 Ileostomy status: Secondary | ICD-10-CM

## 2014-07-24 DIAGNOSIS — E860 Dehydration: Secondary | ICD-10-CM

## 2014-07-24 LAB — CBC WITH DIFFERENTIAL/PLATELET
BASO%: 0.1 % (ref 0.0–2.0)
Basophils Absolute: 0 10*3/uL (ref 0.0–0.1)
EOS%: 0.3 % (ref 0.0–7.0)
Eosinophils Absolute: 0 10*3/uL (ref 0.0–0.5)
HCT: 28.6 % — ABNORMAL LOW (ref 34.8–46.6)
HEMOGLOBIN: 9 g/dL — AB (ref 11.6–15.9)
LYMPH%: 22.1 % (ref 14.0–49.7)
MCH: 31.7 pg (ref 25.1–34.0)
MCHC: 31.5 g/dL (ref 31.5–36.0)
MCV: 100.7 fL (ref 79.5–101.0)
MONO#: 4.1 10*3/uL — AB (ref 0.1–0.9)
MONO%: 34.7 % — AB (ref 0.0–14.0)
NEUT%: 42.8 % (ref 38.4–76.8)
NEUTROS ABS: 5 10*3/uL (ref 1.5–6.5)
PLATELETS: 315 10*3/uL (ref 145–400)
RBC: 2.84 10*6/uL — AB (ref 3.70–5.45)
RDW: 19.6 % — ABNORMAL HIGH (ref 11.2–14.5)
WBC: 11.7 10*3/uL — ABNORMAL HIGH (ref 3.9–10.3)
lymph#: 2.6 10*3/uL (ref 0.9–3.3)

## 2014-07-24 LAB — TECHNOLOGIST REVIEW

## 2014-07-24 MED ORDER — DARBEPOETIN ALFA 500 MCG/ML IJ SOSY
500.0000 ug | PREFILLED_SYRINGE | Freq: Once | INTRAMUSCULAR | Status: AC
  Administered 2014-07-24: 500 ug via SUBCUTANEOUS
  Filled 2014-07-24: qty 1

## 2014-08-07 ENCOUNTER — Ambulatory Visit (HOSPITAL_BASED_OUTPATIENT_CLINIC_OR_DEPARTMENT_OTHER): Payer: 59

## 2014-08-07 ENCOUNTER — Other Ambulatory Visit (HOSPITAL_BASED_OUTPATIENT_CLINIC_OR_DEPARTMENT_OTHER): Payer: 59

## 2014-08-07 VITALS — BP 128/73 | HR 89 | Temp 98.3°F

## 2014-08-07 DIAGNOSIS — C9310 Chronic myelomonocytic leukemia not having achieved remission: Secondary | ICD-10-CM | POA: Diagnosis not present

## 2014-08-07 DIAGNOSIS — E860 Dehydration: Secondary | ICD-10-CM

## 2014-08-07 DIAGNOSIS — D630 Anemia in neoplastic disease: Secondary | ICD-10-CM

## 2014-08-07 DIAGNOSIS — D638 Anemia in other chronic diseases classified elsewhere: Secondary | ICD-10-CM

## 2014-08-07 DIAGNOSIS — E538 Deficiency of other specified B group vitamins: Secondary | ICD-10-CM

## 2014-08-07 DIAGNOSIS — Z932 Ileostomy status: Secondary | ICD-10-CM

## 2014-08-07 LAB — CBC WITH DIFFERENTIAL/PLATELET
BASO%: 0.3 % (ref 0.0–2.0)
Basophils Absolute: 0 10*3/uL (ref 0.0–0.1)
EOS%: 0.6 % (ref 0.0–7.0)
Eosinophils Absolute: 0.1 10*3/uL (ref 0.0–0.5)
HCT: 30.1 % — ABNORMAL LOW (ref 34.8–46.6)
HGB: 9.4 g/dL — ABNORMAL LOW (ref 11.6–15.9)
LYMPH%: 26.5 % (ref 14.0–49.7)
MCH: 31.8 pg (ref 25.1–34.0)
MCHC: 31.2 g/dL — ABNORMAL LOW (ref 31.5–36.0)
MCV: 101.7 fL — AB (ref 79.5–101.0)
MONO#: 3.3 10*3/uL — ABNORMAL HIGH (ref 0.1–0.9)
MONO%: 38.1 % — ABNORMAL HIGH (ref 0.0–14.0)
NEUT#: 3 10*3/uL (ref 1.5–6.5)
NEUT%: 34.5 % — ABNORMAL LOW (ref 38.4–76.8)
Platelets: 283 10*3/uL (ref 145–400)
RBC: 2.96 10*6/uL — AB (ref 3.70–5.45)
RDW: 20 % — AB (ref 11.2–14.5)
WBC: 8.8 10*3/uL (ref 3.9–10.3)
lymph#: 2.3 10*3/uL (ref 0.9–3.3)

## 2014-08-07 LAB — TECHNOLOGIST REVIEW

## 2014-08-07 MED ORDER — DARBEPOETIN ALFA 500 MCG/ML IJ SOSY
500.0000 ug | PREFILLED_SYRINGE | Freq: Once | INTRAMUSCULAR | Status: AC
  Administered 2014-08-07: 500 ug via SUBCUTANEOUS
  Filled 2014-08-07: qty 1

## 2014-08-08 ENCOUNTER — Ambulatory Visit (INDEPENDENT_AMBULATORY_CARE_PROVIDER_SITE_OTHER): Payer: 59 | Admitting: Internal Medicine

## 2014-08-08 ENCOUNTER — Encounter: Payer: Self-pay | Admitting: Internal Medicine

## 2014-08-08 ENCOUNTER — Other Ambulatory Visit: Payer: Self-pay | Admitting: Cardiology

## 2014-08-08 VITALS — BP 112/64 | HR 63 | Ht 65.0 in | Wt 129.2 lb

## 2014-08-08 DIAGNOSIS — J309 Allergic rhinitis, unspecified: Secondary | ICD-10-CM

## 2014-08-08 DIAGNOSIS — J449 Chronic obstructive pulmonary disease, unspecified: Secondary | ICD-10-CM

## 2014-08-08 DIAGNOSIS — J302 Other seasonal allergic rhinitis: Secondary | ICD-10-CM

## 2014-08-08 DIAGNOSIS — J3089 Other allergic rhinitis: Secondary | ICD-10-CM

## 2014-08-08 MED ORDER — ALBUTEROL SULFATE HFA 108 (90 BASE) MCG/ACT IN AERS: 1.0000 | INHALATION_SPRAY | Freq: Four times a day (QID) | RESPIRATORY_TRACT | Status: AC | PRN

## 2014-08-08 NOTE — Progress Notes (Signed)
04/10/14- 21 yoF former smoker, former Designer, [employer],  Self referral-multiple bouts of PNA over past 3 years; former patient of [facility] Hx CMML, chronic bronchitis, AFib, C-diff. She has been followed conservatively for her chronic myelomonocytic leukemia without chemotherapy. Now on Epogen for anemia. Occasional transfusion. History of seasonal rhinitis especially in the fall. 3 years ago Norovirus infectrion was complicated by ischemic colitis and she eventually required total colectomy. Significant weight loss and repeated pneumonias. Last hospitalized August 2015 for dehydration, paroxysmal atrial fibrillation and C.diff. 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 She does not think she has been refluxing or aspirating. She has had pneumovax-23 about 3 years ago and had flu and Prevnar 13 vaccines in 2015. She notices shortness of breath mostly when she is anemic. She denies routine cough or wheeze or phlegm.hmed  06/12/14- 29 yoF former smoker, former [employer], [employer],  Self referral-multiple bouts of PNA over past 3 years/ Chronic bronchitis; complicated by AFib, CMML, ischemic colitis.  Here with friend allergy issues; runny nose (clear) SOB w/activity; prod cough at times w/clear mucus WBC 6,900 with adequate diff Immunoglobulins OK 04/11/15 SPEP- No spike Diptheria and Tetanus toxin antibody- 0.63- OK Patient reports another pneumonia around Xmas, Rx'd w/ levaquin by oncologist. WBC went up to 24K. Now at baseline. Unaware of reflux. C/O watery rhinorhea despite claritin.  DOE w/ ADLs, usually no wheeze so not using her rescue HFA. CXR 04/10/14 IMPRESSION: Patchy somewhat nodular airspace opacities are present at both lung bases, improved from radiographs of 4 months ago whether these are related to resolving or recurrent inflammation is unclear. Consider follow-up CT for further evaluation. Electronically Signed  By: Camie Patience M.D.  08/08/14- 36 yoF former smoker, former  Designer, [employer],  Self referral-multiple bouts of PNA over past 3 years/ Chronic bronchitis; complicated by AFib, CMML, ischemic colitis.  Here with friend   Feels better with higher hgb on Epogen. Still not ready to have ileostomy taken down. Dymista nasal spray burns nose and throat. We discussed alternatives. ROS-see HPI Constitutional:   No-   weight loss, night sweats, fevers, chills, fatigue, lassitude. HEENT:   No-  headaches, difficulty swallowing, tooth/dental problems, sore throat,       No-  sneezing, itching, ear ache, nasal congestion, +post nasal drip,  CV:  No-   chest pain, orthopnea, PND, swelling in lower extremities, anasarca,                                  dizziness, palpitations Resp:   + shortness of breath with exertion or at rest.              No-   productive cough,  No non-productive cough,  No- coughing up of blood.              No-   change in color of mucus.  No- wheezing.   Skin: No-   rash or lesions. GI:  No-   heartburn, indigestion, abdominal pain, nausea, vomiting, GU: . MS:  No-   joint pain or swelling. . Neuro-     nothing unusual Psych:  No- change in mood or affect. No depression or anxiety.  No memory loss.  OBJ- Physical Exam General- Alert, Oriented, Affect-appropriate, Distress- none acute, + looks stronger Skin- rash-none, lesions- none, excoriation- none Lymphadenopathy- none Head- atraumatic            Eyes- Gross vision intact, PERRLA,  conjunctivae and secretions clear            Ears- Hearing, canals-normal            Nose- Clear, no-Septal dev, mucus, polyps, erosion, perforation             Throat- Mallampati II , mucosa clear , drainage- none, tonsils- atrophic Neck- flexible , trachea midline, no stridor , thyroid nl, carotid no bruit Chest - symmetrical excursion , unlabored           Heart/CV- RRR , no murmur , no gallop  , no rub, nl s1 s2                           - JVD- none , edema- none, stasis changes- none, varices-  none           Lung-  wheeze + trace, cough + rattling with laughter , dullness-none, rub- none           Chest wall-  Abd- + ileostomy Br/ Gen/ Rectal- Not done, not indicated Extrem- cyanosis- none, clubbing, none, atrophy- none, strength- nl Neuro- grossly intact to observation    .

## 2014-08-08 NOTE — Patient Instructions (Addendum)
You might try allegra/ fexofenadine instead of claritin for comparison antihistamine  Instead of Dymista, which burned, try either flonase or nasacort otc (nasal steroid sprays) OR Nasalcrom/ cromolyn/ cromol otc nasal spray.  Order - schedule PFT   Dx COPD with chronic bronchitis  Albuterol rescue inhaler refill sent

## 2014-08-10 DIAGNOSIS — J3089 Other allergic rhinitis: Secondary | ICD-10-CM

## 2014-08-10 DIAGNOSIS — J302 Other seasonal allergic rhinitis: Secondary | ICD-10-CM | POA: Insufficient documentation

## 2014-08-10 NOTE — Assessment & Plan Note (Signed)
Nasal congestion and drainage. Dymista was too strong and irritating. Plan-try Nasalcrom

## 2014-08-10 NOTE — Assessment & Plan Note (Signed)
Sense of well-being may reflect higher hemoglobin but she also has had no recent respiratory exacerbations. Plan-schedule PFT, refill rescue inhaler

## 2014-08-21 ENCOUNTER — Ambulatory Visit (HOSPITAL_BASED_OUTPATIENT_CLINIC_OR_DEPARTMENT_OTHER): Payer: 59

## 2014-08-21 ENCOUNTER — Other Ambulatory Visit (HOSPITAL_BASED_OUTPATIENT_CLINIC_OR_DEPARTMENT_OTHER): Payer: 59

## 2014-08-21 VITALS — BP 134/76 | HR 86 | Temp 97.9°F

## 2014-08-21 DIAGNOSIS — D630 Anemia in neoplastic disease: Secondary | ICD-10-CM

## 2014-08-21 DIAGNOSIS — C9310 Chronic myelomonocytic leukemia not having achieved remission: Secondary | ICD-10-CM

## 2014-08-21 DIAGNOSIS — E860 Dehydration: Secondary | ICD-10-CM

## 2014-08-21 DIAGNOSIS — Z932 Ileostomy status: Secondary | ICD-10-CM

## 2014-08-21 DIAGNOSIS — E538 Deficiency of other specified B group vitamins: Secondary | ICD-10-CM

## 2014-08-21 DIAGNOSIS — D638 Anemia in other chronic diseases classified elsewhere: Secondary | ICD-10-CM

## 2014-08-21 LAB — CBC WITH DIFFERENTIAL/PLATELET
BASO%: 0.5 % (ref 0.0–2.0)
BASOS ABS: 0 10*3/uL (ref 0.0–0.1)
EOS%: 1 % (ref 0.0–7.0)
Eosinophils Absolute: 0.1 10*3/uL (ref 0.0–0.5)
HCT: 30.2 % — ABNORMAL LOW (ref 34.8–46.6)
HGB: 9.6 g/dL — ABNORMAL LOW (ref 11.6–15.9)
LYMPH#: 1.5 10*3/uL (ref 0.9–3.3)
LYMPH%: 20.9 % (ref 14.0–49.7)
MCH: 32.1 pg (ref 25.1–34.0)
MCHC: 31.8 g/dL (ref 31.5–36.0)
MCV: 101 fL (ref 79.5–101.0)
MONO#: 2.4 10*3/uL — ABNORMAL HIGH (ref 0.1–0.9)
MONO%: 31.9 % — ABNORMAL HIGH (ref 0.0–14.0)
NEUT#: 3.4 10*3/uL (ref 1.5–6.5)
NEUT%: 45.7 % (ref 38.4–76.8)
Platelets: 156 10*3/uL (ref 145–400)
RBC: 2.99 10*6/uL — ABNORMAL LOW (ref 3.70–5.45)
RDW: 19.8 % — AB (ref 11.2–14.5)
WBC: 7.4 10*3/uL (ref 3.9–10.3)
nRBC: 3 % — ABNORMAL HIGH (ref 0–0)

## 2014-08-21 LAB — TECHNOLOGIST REVIEW

## 2014-08-21 MED ORDER — DARBEPOETIN ALFA 500 MCG/ML IJ SOSY
500.0000 ug | PREFILLED_SYRINGE | Freq: Once | INTRAMUSCULAR | Status: AC
  Administered 2014-08-21: 500 ug via SUBCUTANEOUS
  Filled 2014-08-21: qty 1

## 2014-08-21 NOTE — Patient Instructions (Signed)
Darbepoetin Alfa injection What is this medicine? DARBEPOETIN ALFA (dar be POE e tin AL fa) helps your body make more red blood cells. It is used to treat anemia caused by chronic kidney failure and chemotherapy. This medicine may be used for other purposes; ask your health care provider or pharmacist if you have questions. COMMON BRAND NAME(S): Aranesp What should I tell my health care provider before I take this medicine? They need to know if you have any of these conditions: -blood clotting disorders or history of blood clots -cancer patient not on chemotherapy -cystic fibrosis -heart disease, such as angina, heart failure, or a history of a heart attack -hemoglobin level of 12 g/dL or greater -high blood pressure -low levels of folate, iron, or vitamin B12 -seizures -an unusual or allergic reaction to darbepoetin, erythropoietin, albumin, hamster proteins, latex, other medicines, foods, dyes, or preservatives -pregnant or trying to get pregnant -breast-feeding How should I use this medicine? This medicine is for injection into a vein or under the skin. It is usually given by a health care professional in a hospital or clinic setting. If you get this medicine at home, you will be taught how to prepare and give this medicine. Do not shake the solution before you withdraw a dose. Use exactly as directed. Take your medicine at regular intervals. Do not take your medicine more often than directed. It is important that you put your used needles and syringes in a special sharps container. Do not put them in a trash can. If you do not have a sharps container, call your pharmacist or healthcare provider to get one. Talk to your pediatrician regarding the use of this medicine in children. While this medicine may be used in children as young as 1 year for selected conditions, precautions do apply. Overdosage: If you think you have taken too much of this medicine contact a poison control center or  emergency room at once. NOTE: This medicine is only for you. Do not share this medicine with others. What if I miss a dose? If you miss a dose, take it as soon as you can. If it is almost time for your next dose, take only that dose. Do not take double or extra doses. What may interact with this medicine? Do not take this medicine with any of the following medications: -epoetin alfa This list may not describe all possible interactions. Give your health care provider a list of all the medicines, herbs, non-prescription drugs, or dietary supplements you use. Also tell them if you smoke, drink alcohol, or use illegal drugs. Some items may interact with your medicine. What should I watch for while using this medicine? Visit your prescriber or health care professional for regular checks on your progress and for the needed blood tests and blood pressure measurements. It is especially important for the doctor to make sure your hemoglobin level is in the desired range, to limit the risk of potential side effects and to give you the best benefit. Keep all appointments for any recommended tests. Check your blood pressure as directed. Ask your doctor what your blood pressure should be and when you should contact him or her. As your body makes more red blood cells, you may need to take iron, folic acid, or vitamin B supplements. Ask your doctor or health care provider which products are right for you. If you have kidney disease continue dietary restrictions, even though this medication can make you feel better. Talk with your doctor or health   care professional about the foods you eat and the vitamins that you take. What side effects may I notice from receiving this medicine? Side effects that you should report to your doctor or health care professional as soon as possible: -allergic reactions like skin rash, itching or hives, swelling of the face, lips, or tongue -breathing problems -changes in vision -chest  pain -confusion, trouble speaking or understanding -feeling faint or lightheaded, falls -high blood pressure -muscle aches or pains -pain, swelling, warmth in the leg -rapid weight gain -severe headaches -sudden numbness or weakness of the face, arm or leg -trouble walking, dizziness, loss of balance or coordination -seizures (convulsions) -swelling of the ankles, feet, hands -unusually weak or tired Side effects that usually do not require medical attention (report to your doctor or health care professional if they continue or are bothersome): -diarrhea -fever, chills (flu-like symptoms) -headaches -nausea, vomiting -redness, stinging, or swelling at site where injected This list may not describe all possible side effects. Call your doctor for medical advice about side effects. You may report side effects to FDA at 1-800-FDA-1088. Where should I keep my medicine? Keep out of the reach of children. Store in a refrigerator between 2 and 8 degrees C (36 and 46 degrees F). Do not freeze. Do not shake. Throw away any unused portion if using a single-dose vial. Throw away any unused medicine after the expiration date. NOTE: This sheet is a summary. It may not cover all possible information. If you have questions about this medicine, talk to your doctor, pharmacist, or health care provider.  2015, [employer]. (2008-03-18 10:23:57)  

## 2014-09-04 ENCOUNTER — Other Ambulatory Visit (HOSPITAL_BASED_OUTPATIENT_CLINIC_OR_DEPARTMENT_OTHER): Payer: 59

## 2014-09-04 ENCOUNTER — Ambulatory Visit (HOSPITAL_BASED_OUTPATIENT_CLINIC_OR_DEPARTMENT_OTHER): Payer: 59

## 2014-09-04 VITALS — BP 119/78 | HR 88 | Temp 98.4°F | Resp 18

## 2014-09-04 DIAGNOSIS — D630 Anemia in neoplastic disease: Secondary | ICD-10-CM

## 2014-09-04 DIAGNOSIS — Z932 Ileostomy status: Secondary | ICD-10-CM

## 2014-09-04 DIAGNOSIS — C9310 Chronic myelomonocytic leukemia not having achieved remission: Secondary | ICD-10-CM | POA: Diagnosis not present

## 2014-09-04 DIAGNOSIS — E860 Dehydration: Secondary | ICD-10-CM

## 2014-09-04 DIAGNOSIS — D638 Anemia in other chronic diseases classified elsewhere: Secondary | ICD-10-CM

## 2014-09-04 DIAGNOSIS — E538 Deficiency of other specified B group vitamins: Secondary | ICD-10-CM

## 2014-09-04 LAB — CBC WITH DIFFERENTIAL/PLATELET
BASO%: 0.7 % (ref 0.0–2.0)
Basophils Absolute: 0.1 10*3/uL (ref 0.0–0.1)
EOS%: 0.6 % (ref 0.0–7.0)
Eosinophils Absolute: 0.1 10*3/uL (ref 0.0–0.5)
HEMATOCRIT: 27.9 % — AB (ref 34.8–46.6)
HGB: 8.9 g/dL — ABNORMAL LOW (ref 11.6–15.9)
LYMPH#: 2.5 10*3/uL (ref 0.9–3.3)
LYMPH%: 26.4 % (ref 14.0–49.7)
MCH: 32.4 pg (ref 25.1–34.0)
MCHC: 31.9 g/dL (ref 31.5–36.0)
MCV: 101.5 fL — ABNORMAL HIGH (ref 79.5–101.0)
MONO#: 2.6 10*3/uL — ABNORMAL HIGH (ref 0.1–0.9)
MONO%: 28.3 % — ABNORMAL HIGH (ref 0.0–14.0)
NEUT%: 44 % (ref 38.4–76.8)
NEUTROS ABS: 4.1 10*3/uL (ref 1.5–6.5)
Platelets: 162 10*3/uL (ref 145–400)
RBC: 2.75 10*6/uL — ABNORMAL LOW (ref 3.70–5.45)
RDW: 19.7 % — AB (ref 11.2–14.5)
WBC: 9.3 10*3/uL (ref 3.9–10.3)
nRBC: 2 % — ABNORMAL HIGH (ref 0–0)

## 2014-09-04 LAB — TECHNOLOGIST REVIEW

## 2014-09-04 MED ORDER — DARBEPOETIN ALFA 500 MCG/ML IJ SOSY
500.0000 ug | PREFILLED_SYRINGE | Freq: Once | INTRAMUSCULAR | Status: AC
  Administered 2014-09-04: 500 ug via SUBCUTANEOUS
  Filled 2014-09-04: qty 1

## 2014-09-04 NOTE — Patient Instructions (Signed)
Darbepoetin Alfa injection What is this medicine? DARBEPOETIN ALFA (dar be POE e tin AL fa) helps your body make more red blood cells. It is used to treat anemia caused by chronic kidney failure and chemotherapy. This medicine may be used for other purposes; ask your health care provider or pharmacist if you have questions. COMMON BRAND NAME(S): Aranesp What should I tell my health care provider before I take this medicine? They need to know if you have any of these conditions: -blood clotting disorders or history of blood clots -cancer patient not on chemotherapy -cystic fibrosis -heart disease, such as angina, heart failure, or a history of a heart attack -hemoglobin level of 12 g/dL or greater -high blood pressure -low levels of folate, iron, or vitamin B12 -seizures -an unusual or allergic reaction to darbepoetin, erythropoietin, albumin, hamster proteins, latex, other medicines, foods, dyes, or preservatives -pregnant or trying to get pregnant -breast-feeding How should I use this medicine? This medicine is for injection into a vein or under the skin. It is usually given by a health care professional in a hospital or clinic setting. If you get this medicine at home, you will be taught how to prepare and give this medicine. Do not shake the solution before you withdraw a dose. Use exactly as directed. Take your medicine at regular intervals. Do not take your medicine more often than directed. It is important that you put your used needles and syringes in a special sharps container. Do not put them in a trash can. If you do not have a sharps container, call your pharmacist or healthcare provider to get one. Talk to your pediatrician regarding the use of this medicine in children. While this medicine may be used in children as young as 1 year for selected conditions, precautions do apply. Overdosage: If you think you have taken too much of this medicine contact a poison control center or  emergency room at once. NOTE: This medicine is only for you. Do not share this medicine with others. What if I miss a dose? If you miss a dose, take it as soon as you can. If it is almost time for your next dose, take only that dose. Do not take double or extra doses. What may interact with this medicine? Do not take this medicine with any of the following medications: -epoetin alfa This list may not describe all possible interactions. Give your health care provider a list of all the medicines, herbs, non-prescription drugs, or dietary supplements you use. Also tell them if you smoke, drink alcohol, or use illegal drugs. Some items may interact with your medicine. What should I watch for while using this medicine? Visit your prescriber or health care professional for regular checks on your progress and for the needed blood tests and blood pressure measurements. It is especially important for the doctor to make sure your hemoglobin level is in the desired range, to limit the risk of potential side effects and to give you the best benefit. Keep all appointments for any recommended tests. Check your blood pressure as directed. Ask your doctor what your blood pressure should be and when you should contact him or her. As your body makes more red blood cells, you may need to take iron, folic acid, or vitamin B supplements. Ask your doctor or health care provider which products are right for you. If you have kidney disease continue dietary restrictions, even though this medication can make you feel better. Talk with your doctor or health   care professional about the foods you eat and the vitamins that you take. What side effects may I notice from receiving this medicine? Side effects that you should report to your doctor or health care professional as soon as possible: -allergic reactions like skin rash, itching or hives, swelling of the face, lips, or tongue -breathing problems -changes in vision -chest  pain -confusion, trouble speaking or understanding -feeling faint or lightheaded, falls -high blood pressure -muscle aches or pains -pain, swelling, warmth in the leg -rapid weight gain -severe headaches -sudden numbness or weakness of the face, arm or leg -trouble walking, dizziness, loss of balance or coordination -seizures (convulsions) -swelling of the ankles, feet, hands -unusually weak or tired Side effects that usually do not require medical attention (report to your doctor or health care professional if they continue or are bothersome): -diarrhea -fever, chills (flu-like symptoms) -headaches -nausea, vomiting -redness, stinging, or swelling at site where injected This list may not describe all possible side effects. Call your doctor for medical advice about side effects. You may report side effects to FDA at 1-800-FDA-1088. Where should I keep my medicine? Keep out of the reach of children. Store in a refrigerator between 2 and 8 degrees C (36 and 46 degrees F). Do not freeze. Do not shake. Throw away any unused portion if using a single-dose vial. Throw away any unused medicine after the expiration date. NOTE: This sheet is a summary. It may not cover all possible information. If you have questions about this medicine, talk to your doctor, pharmacist, or health care provider.  2015, [employer]. (2008-03-18 10:23:57)  

## 2014-09-18 ENCOUNTER — Ambulatory Visit (HOSPITAL_BASED_OUTPATIENT_CLINIC_OR_DEPARTMENT_OTHER): Payer: 59

## 2014-09-18 ENCOUNTER — Other Ambulatory Visit (HOSPITAL_BASED_OUTPATIENT_CLINIC_OR_DEPARTMENT_OTHER): Payer: 59

## 2014-09-18 VITALS — BP 121/74 | HR 81 | Temp 98.2°F

## 2014-09-18 DIAGNOSIS — C9310 Chronic myelomonocytic leukemia not having achieved remission: Secondary | ICD-10-CM

## 2014-09-18 DIAGNOSIS — D630 Anemia in neoplastic disease: Secondary | ICD-10-CM

## 2014-09-18 DIAGNOSIS — Z932 Ileostomy status: Secondary | ICD-10-CM

## 2014-09-18 DIAGNOSIS — E538 Deficiency of other specified B group vitamins: Secondary | ICD-10-CM

## 2014-09-18 DIAGNOSIS — E860 Dehydration: Secondary | ICD-10-CM

## 2014-09-18 DIAGNOSIS — D638 Anemia in other chronic diseases classified elsewhere: Secondary | ICD-10-CM

## 2014-09-18 LAB — CBC WITH DIFFERENTIAL/PLATELET
BASO%: 1.2 % (ref 0.0–2.0)
Basophils Absolute: 0.2 10*3/uL — ABNORMAL HIGH (ref 0.0–0.1)
EOS ABS: 0.1 10*3/uL (ref 0.0–0.5)
EOS%: 0.4 % (ref 0.0–7.0)
HCT: 30.4 % — ABNORMAL LOW (ref 34.8–46.6)
HGB: 9.6 g/dL — ABNORMAL LOW (ref 11.6–15.9)
LYMPH#: 3.1 10*3/uL (ref 0.9–3.3)
LYMPH%: 22 % (ref 14.0–49.7)
MCH: 32.3 pg (ref 25.1–34.0)
MCHC: 31.6 g/dL (ref 31.5–36.0)
MCV: 102.4 fL — AB (ref 79.5–101.0)
MONO#: 4.6 10*3/uL — ABNORMAL HIGH (ref 0.1–0.9)
MONO%: 33.1 % — ABNORMAL HIGH (ref 0.0–14.0)
NEUT#: 6 10*3/uL (ref 1.5–6.5)
NEUT%: 43.3 % (ref 38.4–76.8)
Platelets: 265 10*3/uL (ref 145–400)
RBC: 2.97 10*6/uL — ABNORMAL LOW (ref 3.70–5.45)
RDW: 19.2 % — ABNORMAL HIGH (ref 11.2–14.5)
WBC: 13.9 10*3/uL — ABNORMAL HIGH (ref 3.9–10.3)
nRBC: 3 % — ABNORMAL HIGH (ref 0–0)

## 2014-09-18 LAB — TECHNOLOGIST REVIEW

## 2014-09-18 MED ORDER — DARBEPOETIN ALFA 500 MCG/ML IJ SOSY
500.0000 ug | PREFILLED_SYRINGE | Freq: Once | INTRAMUSCULAR | Status: AC
  Administered 2014-09-18: 500 ug via SUBCUTANEOUS
  Filled 2014-09-18: qty 1

## 2014-09-25 ENCOUNTER — Ambulatory Visit (INDEPENDENT_AMBULATORY_CARE_PROVIDER_SITE_OTHER): Payer: 59 | Admitting: Cardiology

## 2014-09-25 ENCOUNTER — Encounter: Payer: Self-pay | Admitting: Cardiology

## 2014-09-25 VITALS — BP 116/68 | HR 72 | Ht 65.0 in | Wt 136.4 lb

## 2014-09-25 DIAGNOSIS — D469 Myelodysplastic syndrome, unspecified: Secondary | ICD-10-CM | POA: Diagnosis not present

## 2014-09-25 DIAGNOSIS — I480 Paroxysmal atrial fibrillation: Secondary | ICD-10-CM | POA: Diagnosis not present

## 2014-09-25 DIAGNOSIS — K551 Chronic vascular disorders of intestine: Secondary | ICD-10-CM | POA: Diagnosis not present

## 2014-09-25 NOTE — Patient Instructions (Signed)
Medication Instructions:  Your physician recommends that you continue on your current medications as directed. Please refer to the Current Medication list given to you today.  Labwork: NONE  Testing/Procedures: NONE  Follow-Up: Your physician wants you to follow-up in: [place] will receive a reminder letter in the mail two months in advance. If you don't receive a letter, please call our office to schedule the follow-up appointment.

## 2014-09-25 NOTE — Progress Notes (Signed)
Cardiology Office Note   Date:  09/25/2014   ID:  [name], DOB 16-Jan-1951, MRN [id number]  PCP:  Warren Danes, MD  Cardiologist: Darlin Coco MD  No chief complaint on file.     History of Present Illness: [name] is a 64 y.o. female who presents for a four-month follow-up visit.  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 In early 2013 she was hospitalized on the medical service after presenting with sudden onset of severe diarrhea associated with syncope and hypotension. During the course of that hospitalization she underwent colonoscopy which showed ischemic colitis. Also during that hospital stay she also had episodes of paroxysmal atrial fibrillation which responded to beta blocker. During that hospital stay she had an echocardiogram showing normal LV systolic function and normal left atrial size. Because of her past history of anemia and multiple comorbidities she was not placed on warfarin. She has a past history of small bowel obstruction. Her surgeon is Dr. Zella Richer.  She has multiple medical issues which are as outlined below. These include PAF, ischemic colitis, depression, HTN, and tobacco abuse. She has a normal EF per echo from earlier this year.  In September 2013 she was admitted at [facility] with another attack of ischemic colitis. She ended up having colon surgery with a colostomy placed. She had PAF while hospitalized. Also with MRSA from a PICC line.  She subsequently had exploratory abdominal surgery here in [place] and had a prolonged post hospital course. Dr. Zella Richer is her surgeon During her hospitalization the patient had paroxysmal atrial fibrillation with rapid ventricular response. She has had several recent admissions to [facility]. Has a functioning ileostomy Her most recent admission was 12/14/13 until 12/22/13 she was admitted with C. difficile enterocolitis. Since discharge  she has been followed closely by oncology and has required blood transfusions for recurring severe anemia.  She receives erythropoietin in the form of Epogen every 2 weeks at the cancer center.  Dr. Alvy Bimler is her hematologist. The patient continues to smoke a few cigarettes against advice. Appetite has improved and her weight is up 7 pounds.  She looks better than she did at her last visit. His last saw her she had upper endoscopy by Dr. Laurence Spates.  She has had no further hospitalizations since last September 2015. Past Medical History  Diagnosis Date  . Hypertension     She has a past hx of essential  . 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 Inflammation of joint of knee     Since we last last saw her she developed problems with an acute which required surgical drainage by her orthopedist Dr. Durward Fortes.  Marland Kitchen MRSA (methicillin resistant Staphylococcus aureus)     Knee surgery drainage was positive for MRSA and she was treated with 3 weeks of doxycycline successfully.  . Diarrhea     Mild  . Exogenous obesity   . GERD (gastroesophageal reflux disease)     2 hosp.- ischemic colitis - residual of Norovirus, 05/2011- sm. bowel obstruction  . Headache(784.0)     migraine headache on occas, less now than when she was younger   . Arthritis     L hip, back, neck   . History of blood transfusion sept 2013    04/2011- /w ischemic colitis , trouble with matching blood  sept 2013  .  Anemia     will see hematology consult prior to surgery, recommended by Dr. Mare Ferrari  . Anemia 12/15/2010  . Ischemic colitis 01/31/2012  . Atrial flutter     during hospitalization, 04/2011- related to anemia & illness/stress   . Pneumonia     04/2011- not hospitalized , pt. denies SOB, changes in chest, breathing  . 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 Dizziness     occasional  . B12 deficiency  02/27/2013  . 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 COPD (chronic obstructive pulmonary disease) 09/26/2013    Past Surgical History  Procedure Laterality Date  . Knee surgery      I&D- 2008, post laceration   . Colonoscopy  05/16/2011    Procedure: COLONOSCOPY;  Surgeon: Winfield Cunas., MD;  Location: [facility];  Service: Endoscopy;  Laterality: N/A;  . Small intestine surgery  1992, 1999  . Laparotomy and lysis of adhesions    . Total hip arthroplasty  10/25/2011    Procedure: TOTAL HIP ARTHROPLASTY;  Surgeon: Garald Balding, MD;  Location: [facility];  Service: Orthopedics;  Laterality: Left;  . Appendectomy  1962  . Abdominal hysterectomy  1988  . Partial colectomy and colostomy  sept 2013    mucous fistula done  . Partial colectomy  05/17/2012    Procedure: PARTIAL COLECTOMY;  Surgeon: Odis Hollingshead, MD;  Location: [facility];  Service: General;  Laterality: N/A;  . Colostomy closure  05/17/2012    Procedure: COLOSTOMY CLOSURE;  Surgeon: Odis Hollingshead, MD;  Location: [facility];  Service: General;  Laterality: N/A;  . Laparotomy  05/17/2012    Procedure: EXPLORATORY LAPAROTOMY;  Surgeon: Odis Hollingshead, MD;  Location: [facility];  Service: General;;  . Lysis of adhesion  05/17/2012    Procedure: LYSIS OF ADHESION;  Surgeon: Odis Hollingshead, MD;  Location: [facility];  Service: General;;  . Esophageal biopsy  05/17/2012    Procedure: BIOPSY;  Surgeon: Odis Hollingshead, MD;  Location: [facility];  Service: General;;  omental biopsy  . Laparotomy  05/22/2012    Procedure: EXPLORATORY LAPAROTOMY;  Surgeon: Odis Hollingshead, MD;  Location: [facility];  Service: General;  Laterality: N/A;  DRAINAGE  INTRA-ABDOMINAL ABSCESS/LYSIS OF ADHESIONSFOR SMALL BOWEL OBSTRUCTION/DIVERTING LOOP ILEOSTOMY  . Tee without cardioversion N/A 11/25/2013    Procedure: TRANSESOPHAGEAL ECHOCARDIOGRAM (TEE);  Surgeon: Dorothy Spark, MD;  Location: [facility];  Service: Cardiovascular;  Laterality: N/A;     Current Outpatient  Prescriptions  Medication Sig Dispense Refill  . albuterol (PROVENTIL HFA;VENTOLIN HFA) 108 (90 BASE) MCG/ACT inhaler Inhale 1-2 puffs into the lungs every 6 (six) hours as needed for wheezing or shortness of breath. 1 Inhaler 11  . aspirin 325 MG EC tablet Take 325 mg by mouth every morning.     . Azelastine-Fluticasone (DYMISTA) 137-50 MCG/ACT SUSP Place 2 sprays into both nostrils at bedtime. 1 Bottle 0  . bisoprolol (ZEBETA) 5 MG tablet take 1 tablet by mouth once daily ;TAKE BEFORE BREAKFAST ! 90 tablet 0  . buPROPion (WELLBUTRIN XL) 300 MG 24 hr tablet Take 1 tablet (300 mg total) by mouth daily before breakfast. 90 tablet 3  . celecoxib (CELEBREX) 200 MG capsule Take 200 mg by mouth daily.     . cyclobenzaprine (FLEXERIL) 10 MG tablet Take 10 mg by mouth 3 (three) times daily as needed for muscle spasms.     Marland Kitchen diltiazem (CARDIZEM CD) 120 MG  24 hr capsule Take 1 capsule (120 mg total) by mouth daily. 90 capsule 3  . diphenoxylate-atropine (LOMOTIL) 2.5-0.025 MG per tablet Take 1 tablet by mouth daily as needed (for diarrhea).     . estradiol (ESTRACE) 2 MG tablet Take 2 mg by mouth daily before breakfast.     . furosemide (LASIX) 40 MG tablet Take 40 mg by mouth daily as needed (for fluid).     Marland Kitchen HYDROcodone-acetaminophen (NORCO) 10-325 MG per tablet Take 1 tablet by mouth every 4 (four) hours as needed (for pain).     Marland Kitchen loratadine-pseudoephedrine (CLARITIN-D 12-HOUR) 5-120 MG per tablet Take 1 tablet by mouth daily as needed for allergies.     . magnesium oxide (MAG-OX) 400 (241.3 MG) MG tablet Take 1 tablet (400 mg total) by mouth 2 (two) times daily. 60 tablet 0  . ondansetron (ZOFRAN) 4 MG tablet Take 4 mg by mouth every 8 (eight) hours as needed for nausea or vomiting.    . potassium chloride 20 MEQ TBCR Take 20 mEq by mouth 3 (three) times daily. 21 tablet 0  . promethazine (PHENERGAN) 25 MG tablet Take 25 mg by mouth every 6 (six) hours as needed for nausea or vomiting (nausea).    .  RABEprazole (ACIPHEX) 20 MG tablet Take 20 mg by mouth daily.  0   No current facility-administered medications for this visit.    Allergies:   Vancomycin; Ativan; Codeine; Tetanus toxoids; Penicillins; and Xarelto    Social History:  The patient  reports that she quit smoking about 18 months ago. Her smoking use included Cigarettes. She has a 5 pack-year smoking history. She has never used smokeless tobacco. She reports that she does not drink alcohol or use illicit drugs.   Family History:  The patient's family history includes Atrial fibrillation in her father; Hypertension in her mother; Stroke in her father.    ROS:  Please see the history of present illness.   Otherwise, review of systems are positive for none.   All other systems are reviewed and negative.    PHYSICAL EXAM: VS:  BP 116/68 mmHg  Pulse 72  Ht 5\' 5"  (1.651 m)  Wt 136 lb 6.4 oz (61.871 kg)  BMI 22.70 kg/m2 , BMI Body mass index is 22.7 kg/(m^2). GEN: Well nourished, well developed, in no acute distress HEENT: normal Neck: no JVD, carotid bruits, or masses Cardiac: RRR; no murmurs, rubs, or gallops,no edema  Respiratory:  clear to auscultation bilaterally, normal work of breathing GI: soft, nontender, nondistended, + BS MS: no deformity or atrophy Skin: Dusky acrocyanosis of the dependent lower extremities which are cold to the touch.  Pedal pulses are present. Neuro:  Strength and sensation are intact Psych: euthymic mood, full affect   EKG:  EKG is not ordered today.    Recent Labs: 12/14/2013: ALT 10 12/22/2013: BUN 9; Creatinine, Ser 0.74; Magnesium 1.7; Potassium 3.6*; Sodium 137 09/18/2014: HGB 9.6*; Platelets 265    Lipid Panel    Component Value Date/Time   CHOL 108 05/28/2012 0420   TRIG 105 05/28/2012 0420   HDL 92.50 12/15/2010 1144   CHOLHDL 2 12/15/2010 1144   VLDL 13.6 12/15/2010 1144   LDLCALC * 07/20/2007 0327    115        Total Cholesterol/HDL:CHD Risk Coronary Heart Disease Risk  Table                     Men   Women  1/2 Average  Risk   3.4   3.3   LDLDIRECT 105.6 12/15/2010 1144      Wt Readings from Last 3 Encounters:  09/25/14 136 lb 6.4 oz (61.871 kg)  08/08/14 129 lb 3.2 oz (58.605 kg)  06/12/14 129 lb 14.4 oz (58.922 kg)        ASSESSMENT AND PLAN:  Paroxysmal atrial fibrillation, resolved. She had an echocardiogram on 11/19/13 showing ejection fraction 60-65%. We will continue current dose of diltiazem CD 120 mg daily and Zebeta 5 mg daily Hypotension, resolved  Chronic hyponatremia  CMML (chronic myelomonocytic leukemia) with anemia. She is now receiving Epogen shots every 2 weeks through the cancer center. COPD followed closely by Dr. Baird Lyons Past history of ischemic colitis.  History of functioning ileostomy.   Current medicines are reviewed at length with the patient today.  The patient does not have concerns regarding medicines.  The following changes have been made:  no change  Labs/ tests ordered today include:  No orders of the defined types were placed in this encounter.     Disposition: Continue current medication.  I have asked her to stop smoking altogether.  She'll return in 4 months for follow-up office visit and EKG.  I am pleased to see that she is putting back some weight.  Berna Spare MD 09/25/2014 3:04 PM    [facility] [address], [place], [place]  [address] Phone: [phone]; Fax: [phone]

## 2014-10-01 ENCOUNTER — Ambulatory Visit (INDEPENDENT_AMBULATORY_CARE_PROVIDER_SITE_OTHER)
Admission: RE | Admit: 2014-10-01 | Discharge: 2014-10-01 | Disposition: A | Discharge status: Home / Self Care | Payer: 59 | Source: Ambulatory Visit | Attending: Internal Medicine | Admitting: Internal Medicine

## 2014-10-01 ENCOUNTER — Encounter: Payer: Self-pay | Admitting: Internal Medicine

## 2014-10-01 ENCOUNTER — Ambulatory Visit (INDEPENDENT_AMBULATORY_CARE_PROVIDER_SITE_OTHER): Payer: 59 | Admitting: Internal Medicine

## 2014-10-01 VITALS — BP 94/60 | HR 80 | Temp 97.5°F | Ht 65.0 in | Wt 135.0 lb

## 2014-10-01 DIAGNOSIS — Z720 Tobacco use: Secondary | ICD-10-CM | POA: Diagnosis not present

## 2014-10-01 DIAGNOSIS — J441 Chronic obstructive pulmonary disease with (acute) exacerbation: Secondary | ICD-10-CM | POA: Diagnosis not present

## 2014-10-01 DIAGNOSIS — J209 Acute bronchitis, unspecified: Secondary | ICD-10-CM

## 2014-10-01 MED ORDER — CEFUROXIME AXETIL 250 MG PO TABS: 250.0000 mg | ORAL_TABLET | Freq: Two times a day (BID) | ORAL | Status: DC

## 2014-10-01 NOTE — Progress Notes (Signed)
04/10/14- 63 yoF former smoker, former Designer, jewellery,  Self referral-multiple bouts of PNA over past 3 years; former patient of [facility] Hx CMML, chronic bronchitis, AFib, C-diff. She has been followed conservatively for her chronic myelomonocytic leukemia without chemotherapy. Now on Epogen for anemia. Occasional transfusion. History of seasonal rhinitis especially in the fall. 3 years ago Norovirus infectrion was complicated by ischemic colitis and she eventually required total colectomy. Significant weight loss and repeated pneumonias. Last hospitalized August 2015 for dehydration, paroxysmal atrial fibrillation and C.diff. 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 She does not think she has been refluxing or aspirating. She has had pneumovax-23 about 3 years ago and had flu and Prevnar 13 vaccines in 2015. She notices shortness of breath mostly when she is anemic. She denies routine cough or wheeze or phlegm.hmed  06/12/14- 71 yoF former smoker, former Designer, [employer]ell[employer],  Self referral-multiple bouts of PNA over past 3 years/ Chronic bronchitis; complicated by AFib, CMML, ischemic colitis.  Here with friend allergy issues; runny nose (clear) SOB w/activity; prod cough at times w/clear mucus WBC 6,900 with adequate diff Immunoglobulins OK 04/11/15 SPEP- No spike Diptheria and Tetanus toxin antibody- 0.63- OK Patient reports another pneumonia around Xmas, Rx'd w/ levaquin by oncologist. WBC went up to 24K. Now at baseline. Unaware of reflux. C/O watery rhinorhea despite claritin.  DOE w/ ADLs, usually no wheeze so not using her rescue HFA. CXR 04/10/14 IMPRESSION: Patchy somewhat nodular airspace opacities are present at both lung bases, improved from radiographs of 4 months ago whether these are related to resolving or recurrent inflammation is unclear. Consider follow-up CT for further evaluation. Electronically Signed  By: Camie Patience M.D.  08/08/14- 33 yoF former smoker, former  Designer, jewellery,  Self referral-multiple bouts of PNA over past 3 years/ Chronic bronchitis; complicated by AFib, CMML, ischemic colitis.  Here with friend   Feels better with higher hgb on Epogen. Still not ready to have ileostomy taken down. Dymista nasal spray burns nose and throat. We discussed alternatives.  10/01/14- 56 yoF former smoker, former Designer, [employer]ell[employer],  Self referral-multiple bouts of PNA over past 3 years/ Chronic bronchitis; complicated by AFib, CMML, ischemic colitis.  Here with friend  FOLLOWS FOR: Pt presents for acute visit. She c/o increased SOB, cough, wheezing, and soreness on left side of her chest for approx 1 wk. Her cough is non prod. She also c/o "cold sweats" and fatigue. Last antibiotic in January. Gets Epogen every 2 weeks, due tomorrow to keep blood counts up.  ROS-see HPI Constitutional:   No-   weight loss, night sweats, fevers, +chills, fatigue, lassitude. HEENT:   No-  headaches, difficulty swallowing, tooth/dental problems, sore throat,       No-  sneezing, itching, ear ache, nasal congestion, +post nasal drip,  CV:  No-   chest pain, orthopnea, PND, swelling in lower extremities, anasarca,                                  dizziness, palpitations Resp:   + shortness of breath with exertion or at rest.              No-   productive cough,  + non-productive cough,  No- coughing up of blood.              No-   change in color of mucus.  No- wheezing.   Skin: No-   rash or lesions.  GI:  No-   heartburn, indigestion, abdominal pain, nausea, vomiting, GU: . MS:  No-   joint pain or swelling. . Neuro-     nothing unusual Psych:  No- change in mood or affect. No depression or anxiety.  No memory loss.  OBJ- Physical Exam General- Alert, Oriented, Affect-appropriate, Distress- none acute, + looks stronger Skin- rash-none, lesions- none, excoriation- none Lymphadenopathy- none Head- atraumatic            Eyes- Gross vision intact, PERRLA, conjunctivae  and secretions clear            Ears- Hearing, canals-normal            Nose- Clear, no-Septal dev, mucus, polyps, erosion, perforation             Throat- Mallampati II , mucosa clear , drainage- none, tonsils- atrophic Neck- flexible , trachea midline, no stridor , thyroid nl, carotid no bruit Chest - symmetrical excursion , unlabored           Heart/CV- RRR , no murmur , no gallop  , no rub, nl s1 s2                           - JVD- none , edema- none, stasis changes- none, varices- none           Lung-  wheeze + trace, cough + deep, rhonchi + few , dullness-none, rub- none           Chest wall-  Abd- + ileostomy Br/ Gen/ Rectal- Not done, not indicated Extrem- cyanosis- none, clubbing, none, atrophy- none, strength- nl Neuro- grossly intact to observation    .

## 2014-10-01 NOTE — Patient Instructions (Addendum)
Script for ceftin sent  Order- CXR   Acute bronchitis  Keep August appointment unless you need help sooner

## 2014-10-02 ENCOUNTER — Other Ambulatory Visit (HOSPITAL_BASED_OUTPATIENT_CLINIC_OR_DEPARTMENT_OTHER): Payer: 59

## 2014-10-02 ENCOUNTER — Ambulatory Visit (HOSPITAL_BASED_OUTPATIENT_CLINIC_OR_DEPARTMENT_OTHER): Payer: 59

## 2014-10-02 VITALS — BP 112/66 | HR 92 | Temp 98.5°F | Resp 26

## 2014-10-02 DIAGNOSIS — C9310 Chronic myelomonocytic leukemia not having achieved remission: Secondary | ICD-10-CM | POA: Diagnosis not present

## 2014-10-02 DIAGNOSIS — E860 Dehydration: Secondary | ICD-10-CM

## 2014-10-02 DIAGNOSIS — E538 Deficiency of other specified B group vitamins: Secondary | ICD-10-CM

## 2014-10-02 DIAGNOSIS — Z932 Ileostomy status: Secondary | ICD-10-CM

## 2014-10-02 DIAGNOSIS — D630 Anemia in neoplastic disease: Secondary | ICD-10-CM

## 2014-10-02 DIAGNOSIS — D638 Anemia in other chronic diseases classified elsewhere: Secondary | ICD-10-CM

## 2014-10-02 LAB — CBC WITH DIFFERENTIAL/PLATELET
BASO%: 0.7 % (ref 0.0–2.0)
Basophils Absolute: 0 10*3/uL (ref 0.0–0.1)
EOS%: 0.5 % (ref 0.0–7.0)
Eosinophils Absolute: 0 10*3/uL (ref 0.0–0.5)
HCT: 26.4 % — ABNORMAL LOW (ref 34.8–46.6)
HGB: 8.2 g/dL — ABNORMAL LOW (ref 11.6–15.9)
LYMPH%: 22.2 % (ref 14.0–49.7)
MCH: 31.3 pg (ref 25.1–34.0)
MCHC: 31.1 g/dL — AB (ref 31.5–36.0)
MCV: 100.8 fL (ref 79.5–101.0)
MONO#: 1.8 10*3/uL — ABNORMAL HIGH (ref 0.1–0.9)
MONO%: 31.9 % — AB (ref 0.0–14.0)
NEUT%: 44.7 % (ref 38.4–76.8)
NEUTROS ABS: 2.5 10*3/uL (ref 1.5–6.5)
Platelets: 87 10*3/uL — ABNORMAL LOW (ref 145–400)
RBC: 2.62 10*6/uL — AB (ref 3.70–5.45)
RDW: 19.4 % — ABNORMAL HIGH (ref 11.2–14.5)
WBC: 5.6 10*3/uL (ref 3.9–10.3)
lymph#: 1.2 10*3/uL (ref 0.9–3.3)
nRBC: 2 % — ABNORMAL HIGH (ref 0–0)

## 2014-10-02 LAB — TECHNOLOGIST REVIEW

## 2014-10-02 MED ORDER — DARBEPOETIN ALFA 500 MCG/ML IJ SOSY
500.0000 ug | PREFILLED_SYRINGE | Freq: Once | INTRAMUSCULAR | Status: AC
Start: 2014-10-02 — End: 2014-10-02
  Administered 2014-10-02: 500 ug via SUBCUTANEOUS
  Filled 2014-10-02: qty 1

## 2014-10-03 ENCOUNTER — Telehealth: Payer: Self-pay | Admitting: Internal Medicine

## 2014-10-03 NOTE — Telephone Encounter (Signed)
Pt informed of results. Nothing further needed at this time.

## 2014-10-05 NOTE — Assessment & Plan Note (Signed)
Exacerbation consistent with bacterial infection and risk of pneumonia Plan-start cephalosporin, chest x-ray

## 2014-10-05 NOTE — Assessment & Plan Note (Signed)
Remains abstinent 

## 2014-10-16 ENCOUNTER — Ambulatory Visit (HOSPITAL_BASED_OUTPATIENT_CLINIC_OR_DEPARTMENT_OTHER): Payer: 59

## 2014-10-16 ENCOUNTER — Other Ambulatory Visit (HOSPITAL_BASED_OUTPATIENT_CLINIC_OR_DEPARTMENT_OTHER): Payer: 59

## 2014-10-16 VITALS — BP 123/72 | HR 85 | Temp 98.3°F

## 2014-10-16 DIAGNOSIS — C9310 Chronic myelomonocytic leukemia not having achieved remission: Secondary | ICD-10-CM

## 2014-10-16 DIAGNOSIS — D630 Anemia in neoplastic disease: Secondary | ICD-10-CM

## 2014-10-16 DIAGNOSIS — E860 Dehydration: Secondary | ICD-10-CM

## 2014-10-16 DIAGNOSIS — Z932 Ileostomy status: Secondary | ICD-10-CM

## 2014-10-16 DIAGNOSIS — E538 Deficiency of other specified B group vitamins: Secondary | ICD-10-CM

## 2014-10-16 DIAGNOSIS — D638 Anemia in other chronic diseases classified elsewhere: Secondary | ICD-10-CM

## 2014-10-16 LAB — CBC WITH DIFFERENTIAL/PLATELET
BASO%: 0.9 % (ref 0.0–2.0)
BASOS ABS: 0.2 10*3/uL — AB (ref 0.0–0.1)
EOS%: 0.3 % (ref 0.0–7.0)
Eosinophils Absolute: 0.1 10*3/uL (ref 0.0–0.5)
HCT: 28.3 % — ABNORMAL LOW (ref 34.8–46.6)
HGB: 8.7 g/dL — ABNORMAL LOW (ref 11.6–15.9)
LYMPH#: 4.7 10*3/uL — AB (ref 0.9–3.3)
LYMPH%: 23.5 % (ref 14.0–49.7)
MCH: 31.3 pg (ref 25.1–34.0)
MCHC: 30.7 g/dL — ABNORMAL LOW (ref 31.5–36.0)
MCV: 101.8 fL — ABNORMAL HIGH (ref 79.5–101.0)
MONO#: 3.6 10*3/uL — AB (ref 0.1–0.9)
MONO%: 18 % — AB (ref 0.0–14.0)
NEUT#: 11.5 10*3/uL — ABNORMAL HIGH (ref 1.5–6.5)
NEUT%: 57.3 % (ref 38.4–76.8)
Platelets: 279 10*3/uL (ref 145–400)
RBC: 2.78 10*6/uL — ABNORMAL LOW (ref 3.70–5.45)
RDW: 20.4 % — ABNORMAL HIGH (ref 11.2–14.5)
WBC: 20.2 10*3/uL — ABNORMAL HIGH (ref 3.9–10.3)
nRBC: 3 % — ABNORMAL HIGH (ref 0–0)

## 2014-10-16 LAB — TECHNOLOGIST REVIEW

## 2014-10-16 LAB — HOLD TUBE, BLOOD BANK

## 2014-10-16 MED ORDER — DARBEPOETIN ALFA 500 MCG/ML IJ SOSY
500.0000 ug | PREFILLED_SYRINGE | Freq: Once | INTRAMUSCULAR | Status: AC
  Administered 2014-10-16: 500 ug via SUBCUTANEOUS
  Filled 2014-10-16: qty 1

## 2014-10-30 ENCOUNTER — Other Ambulatory Visit (HOSPITAL_BASED_OUTPATIENT_CLINIC_OR_DEPARTMENT_OTHER): Payer: 59

## 2014-10-30 ENCOUNTER — Ambulatory Visit (HOSPITAL_BASED_OUTPATIENT_CLINIC_OR_DEPARTMENT_OTHER): Payer: 59

## 2014-10-30 VITALS — BP 148/78 | HR 93 | Temp 98.3°F

## 2014-10-30 DIAGNOSIS — C9310 Chronic myelomonocytic leukemia not having achieved remission: Secondary | ICD-10-CM

## 2014-10-30 DIAGNOSIS — E538 Deficiency of other specified B group vitamins: Secondary | ICD-10-CM

## 2014-10-30 DIAGNOSIS — D630 Anemia in neoplastic disease: Secondary | ICD-10-CM

## 2014-10-30 DIAGNOSIS — E860 Dehydration: Secondary | ICD-10-CM

## 2014-10-30 DIAGNOSIS — D638 Anemia in other chronic diseases classified elsewhere: Secondary | ICD-10-CM

## 2014-10-30 DIAGNOSIS — Z932 Ileostomy status: Secondary | ICD-10-CM

## 2014-10-30 LAB — IRON AND TIBC CHCC
%SAT: 56 % (ref 21–57)
IRON: 173 ug/dL — AB (ref 41–142)
TIBC: 310 ug/dL (ref 236–444)
UIBC: 137 ug/dL (ref 120–384)

## 2014-10-30 LAB — CBC WITH DIFFERENTIAL/PLATELET
BASO%: 1.4 % (ref 0.0–2.0)
BASOS ABS: 0.1 10*3/uL (ref 0.0–0.1)
EOS%: 1 % (ref 0.0–7.0)
Eosinophils Absolute: 0 10*3/uL (ref 0.0–0.5)
HEMATOCRIT: 26.7 % — AB (ref 34.8–46.6)
HGB: 8.4 g/dL — ABNORMAL LOW (ref 11.6–15.9)
LYMPH%: 26.1 % (ref 14.0–49.7)
MCH: 32 pg (ref 25.1–34.0)
MCHC: 31.4 g/dL — AB (ref 31.5–36.0)
MCV: 102 fL — ABNORMAL HIGH (ref 79.5–101.0)
MONO#: 1.6 10*3/uL — AB (ref 0.1–0.9)
MONO%: 31.2 % — ABNORMAL HIGH (ref 0.0–14.0)
NEUT%: 40.3 % (ref 38.4–76.8)
NEUTROS ABS: 2.1 10*3/uL (ref 1.5–6.5)
PLATELETS: 181 10*3/uL (ref 145–400)
RBC: 2.62 10*6/uL — ABNORMAL LOW (ref 3.70–5.45)
RDW: 21.1 % — AB (ref 11.2–14.5)
WBC: 5.2 10*3/uL (ref 3.9–10.3)
lymph#: 1.4 10*3/uL (ref 0.9–3.3)

## 2014-10-30 LAB — HOLD TUBE, BLOOD BANK

## 2014-10-30 LAB — VITAMIN B12: VITAMIN B 12: 384 pg/mL (ref 211–911)

## 2014-10-30 LAB — TECHNOLOGIST REVIEW

## 2014-10-30 LAB — FERRITIN CHCC: Ferritin: 224 ng/ml (ref 9–269)

## 2014-10-30 MED ORDER — DARBEPOETIN ALFA 500 MCG/ML IJ SOSY
500.0000 ug | PREFILLED_SYRINGE | Freq: Once | INTRAMUSCULAR | Status: AC
  Administered 2014-10-30: 500 ug via SUBCUTANEOUS
  Filled 2014-10-30: qty 1

## 2014-11-11 ENCOUNTER — Other Ambulatory Visit: Payer: Self-pay | Admitting: Cardiology

## 2014-11-13 ENCOUNTER — Other Ambulatory Visit (HOSPITAL_BASED_OUTPATIENT_CLINIC_OR_DEPARTMENT_OTHER): Payer: 59

## 2014-11-13 ENCOUNTER — Ambulatory Visit (HOSPITAL_BASED_OUTPATIENT_CLINIC_OR_DEPARTMENT_OTHER): Payer: 59

## 2014-11-13 VITALS — BP 124/70 | HR 91 | Temp 98.5°F

## 2014-11-13 DIAGNOSIS — C9310 Chronic myelomonocytic leukemia not having achieved remission: Secondary | ICD-10-CM

## 2014-11-13 DIAGNOSIS — D630 Anemia in neoplastic disease: Secondary | ICD-10-CM | POA: Diagnosis not present

## 2014-11-13 DIAGNOSIS — E538 Deficiency of other specified B group vitamins: Secondary | ICD-10-CM

## 2014-11-13 DIAGNOSIS — E860 Dehydration: Secondary | ICD-10-CM

## 2014-11-13 DIAGNOSIS — D638 Anemia in other chronic diseases classified elsewhere: Secondary | ICD-10-CM

## 2014-11-13 DIAGNOSIS — Z932 Ileostomy status: Secondary | ICD-10-CM

## 2014-11-13 LAB — CBC WITH DIFFERENTIAL/PLATELET
BASO%: 0.6 % (ref 0.0–2.0)
Basophils Absolute: 0 10*3/uL (ref 0.0–0.1)
EOS ABS: 0 10*3/uL (ref 0.0–0.5)
EOS%: 0.6 % (ref 0.0–7.0)
HCT: 28.5 % — ABNORMAL LOW (ref 34.8–46.6)
HGB: 8.9 g/dL — ABNORMAL LOW (ref 11.6–15.9)
LYMPH%: 21.4 % (ref 14.0–49.7)
MCH: 31.3 pg (ref 25.1–34.0)
MCHC: 31.2 g/dL — ABNORMAL LOW (ref 31.5–36.0)
MCV: 100.4 fL (ref 79.5–101.0)
MONO#: 2 10*3/uL — AB (ref 0.1–0.9)
MONO%: 27.3 % — AB (ref 0.0–14.0)
NEUT#: 3.6 10*3/uL (ref 1.5–6.5)
NEUT%: 50.1 % (ref 38.4–76.8)
NRBC: 2 % — AB (ref 0–0)
PLATELETS: 139 10*3/uL — AB (ref 145–400)
RBC: 2.84 10*6/uL — ABNORMAL LOW (ref 3.70–5.45)
RDW: 21.1 % — AB (ref 11.2–14.5)
WBC: 7.2 10*3/uL (ref 3.9–10.3)
lymph#: 1.5 10*3/uL (ref 0.9–3.3)

## 2014-11-13 LAB — TECHNOLOGIST REVIEW

## 2014-11-13 MED ORDER — DARBEPOETIN ALFA 500 MCG/ML IJ SOSY
500.0000 ug | PREFILLED_SYRINGE | Freq: Once | INTRAMUSCULAR | Status: AC
  Administered 2014-11-13: 500 ug via SUBCUTANEOUS
  Filled 2014-11-13: qty 1

## 2014-11-27 ENCOUNTER — Other Ambulatory Visit (HOSPITAL_BASED_OUTPATIENT_CLINIC_OR_DEPARTMENT_OTHER): Payer: 59

## 2014-11-27 ENCOUNTER — Ambulatory Visit (HOSPITAL_BASED_OUTPATIENT_CLINIC_OR_DEPARTMENT_OTHER): Payer: 59

## 2014-11-27 VITALS — BP 125/76 | HR 104 | Temp 97.8°F

## 2014-11-27 DIAGNOSIS — C9310 Chronic myelomonocytic leukemia not having achieved remission: Secondary | ICD-10-CM

## 2014-11-27 DIAGNOSIS — E860 Dehydration: Secondary | ICD-10-CM

## 2014-11-27 DIAGNOSIS — D638 Anemia in other chronic diseases classified elsewhere: Secondary | ICD-10-CM

## 2014-11-27 DIAGNOSIS — D630 Anemia in neoplastic disease: Secondary | ICD-10-CM

## 2014-11-27 DIAGNOSIS — E538 Deficiency of other specified B group vitamins: Secondary | ICD-10-CM

## 2014-11-27 DIAGNOSIS — Z932 Ileostomy status: Secondary | ICD-10-CM

## 2014-11-27 LAB — CBC WITH DIFFERENTIAL/PLATELET
BASO%: 0.7 % (ref 0.0–2.0)
BASOS ABS: 0.1 10*3/uL (ref 0.0–0.1)
EOS ABS: 0 10*3/uL (ref 0.0–0.5)
EOS%: 0.3 % (ref 0.0–7.0)
HCT: 30.4 % — ABNORMAL LOW (ref 34.8–46.6)
HGB: 9.5 g/dL — ABNORMAL LOW (ref 11.6–15.9)
LYMPH%: 28 % (ref 14.0–49.7)
MCH: 31.3 pg (ref 25.1–34.0)
MCHC: 31.3 g/dL — ABNORMAL LOW (ref 31.5–36.0)
MCV: 100 fL (ref 79.5–101.0)
MONO#: 2.7 10*3/uL — ABNORMAL HIGH (ref 0.1–0.9)
MONO%: 29.2 % — AB (ref 0.0–14.0)
NEUT#: 3.8 10*3/uL (ref 1.5–6.5)
NEUT%: 41.8 % (ref 38.4–76.8)
Platelets: 255 10*3/uL (ref 145–400)
RBC: 3.04 10*6/uL — AB (ref 3.70–5.45)
RDW: 21.3 % — ABNORMAL HIGH (ref 11.2–14.5)
WBC: 9.1 10*3/uL (ref 3.9–10.3)
lymph#: 2.5 10*3/uL (ref 0.9–3.3)

## 2014-11-27 LAB — TECHNOLOGIST REVIEW

## 2014-11-27 MED ORDER — DARBEPOETIN ALFA 500 MCG/ML IJ SOSY
500.0000 ug | PREFILLED_SYRINGE | Freq: Once | INTRAMUSCULAR | Status: AC
  Administered 2014-11-27: 500 ug via SUBCUTANEOUS
  Filled 2014-11-27: qty 1

## 2014-12-08 ENCOUNTER — Ambulatory Visit (INDEPENDENT_AMBULATORY_CARE_PROVIDER_SITE_OTHER): Payer: 59 | Admitting: Internal Medicine

## 2014-12-08 ENCOUNTER — Encounter: Payer: Self-pay | Admitting: Internal Medicine

## 2014-12-08 VITALS — BP 110/62 | HR 93 | Ht 65.0 in | Wt 127.8 lb

## 2014-12-08 DIAGNOSIS — K2100 Gastro-esophageal reflux disease with esophagitis, without bleeding: Secondary | ICD-10-CM

## 2014-12-08 DIAGNOSIS — J441 Chronic obstructive pulmonary disease with (acute) exacerbation: Secondary | ICD-10-CM

## 2014-12-08 MED ORDER — FLUTTER DEVI: Status: AC

## 2014-12-08 MED ORDER — CEFUROXIME AXETIL 250 MG PO TABS: 250.0000 mg | ORAL_TABLET | Freq: Two times a day (BID) | ORAL | Status: AC

## 2014-12-08 MED ORDER — COMPRESSOR NEBULIZER MISC: 1.0000 | Freq: Four times a day (QID) | Status: AC | PRN

## 2014-12-08 MED ORDER — ALBUTEROL SULFATE (2.5 MG/3ML) 0.083% IN NEBU: 2.5000 mg | INHALATION_SOLUTION | Freq: Four times a day (QID) | RESPIRATORY_TRACT | Status: AC | PRN

## 2014-12-08 NOTE — Progress Notes (Signed)
04/10/14- 39 yoF former smoker, former Designer, [employer]ell[employer],  Self referral-multiple bouts of PNA over past 3 years; former patient of [facility] Hx CMML, chronic bronchitis, AFib, C-diff. She has been followed conservatively for her chronic myelomonocytic leukemia without chemotherapy. Now on Epogen for anemia. Occasional transfusion. History of seasonal rhinitis especially in the fall. 3 years ago Norovirus infectrion was complicated by ischemic colitis and she eventually required total colectomy. Significant weight loss and repeated pneumonias. Last hospitalized August 2015 for dehydration, paroxysmal atrial fibrillation and C.diff. 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 She does not think she has been refluxing or aspirating. She has had pneumovax-23 about 3 years ago and had flu and Prevnar 13 vaccines in 2015. She notices shortness of breath mostly when she is anemic. She denies routine cough or wheeze or phlegm.hmed  06/12/14- 89 yoF former smoker, former Designer, [employer]ell[employer],  Self referral-multiple bouts of PNA over past 3 years/ Chronic bronchitis; complicated by AFib, CMML, ischemic colitis.  Here with friend allergy issues; runny nose (clear) SOB w/activity; prod cough at times w/clear mucus WBC 6,900 with adequate diff Immunoglobulins OK 04/11/15 SPEP- No spike Diptheria and Tetanus toxin antibody- 0.63- OK Patient reports another pneumonia around Xmas, Rx'd w/ levaquin by oncologist. WBC went up to 24K. Now at baseline. Unaware of reflux. C/O watery rhinorhea despite claritin.  DOE w/ ADLs, usually no wheeze so not using her rescue HFA. CXR 04/10/14 IMPRESSION: Patchy somewhat nodular airspace opacities are present at both lung bases, improved from radiographs of 4 months ago whether these are related to resolving or recurrent inflammation is unclear. Consider follow-up CT for further evaluation. Electronically Signed  By: Camie Patience M.D.  08/08/14- 88 yoF former smoker, former  Designer, jewellery,  Self referral-multiple bouts of PNA over past 3 years/ Chronic bronchitis; complicated by AFib, CMML, ischemic colitis.  Here with friend   Feels better with higher hgb on Epogen. Still not ready to have ileostomy taken down. Dymista nasal spray burns nose and throat. We discussed alternatives.  10/01/14- 66 yoF former smoker, former Designer, jewellery,  Self referral-multiple bouts of PNA over past 3 years/ Chronic bronchitis; complicated by AFib, CMML, ischemic colitis.  Here with friend  FOLLOWS FOR: Pt presents for acute visit. She c/o increased SOB, cough, wheezing, and soreness on left side of her chest for approx 1 wk. Her cough is non prod. She also c/o "cold sweats" and fatigue. Last antibiotic in January. Gets Epogen every 2 weeks, due tomorrow to keep blood counts up.  12/08/14- 63 yoF former smoker, former Designer, jewellery,  Self referral-multiple bouts of PNA over past 3 years/ Chronic bronchitis; complicated by AFib, CMML, ischemic colitis.  Here with friend FOLLOWS FOR: Pt was scheduled for PFT but did not have it due to pt having difficulty breathing. Pt states she completed an abx 2.5 weeks ago and states she is still not back to baseline. Pt c/p DOE, cough with little mucus production - white in color. Pt denies CP/tightness.  [place], finishing Ceftin 2 and half weeks ago. Gradually better. Phlegm now quite/scant with no blood no fever. Dr. Charolotte Capuchin treating with Nexium for recognized GERD with active reflux and recurrent aspiration. Dentist recognized enamel erosion from reflux. CXR 10/01/14- IMPRESSION: Bilateral patchy pulmonary infiltrates noted most consistent with pneumonia. Electronically Signed  By: Marcello Moores Register  On: 10/01/2014 12:25   ROS-see HPI Constitutional:   No-   weight loss, night sweats, fevers, +chills, fatigue, lassitude. HEENT:   No-  headaches, difficulty swallowing, tooth/dental problems, sore throat,        No-  sneezing, itching, ear ache, nasal congestion, +post nasal drip,  CV:  No-   chest pain, orthopnea, PND, swelling in lower extremities, anasarca,                                              dizziness, palpitations Resp:   + shortness of breath with exertion or at rest.              No-   productive cough,  + non-productive cough,  No- coughing up of blood.              No-   change in color of mucus.  No- wheezing.   Skin: No-   rash or lesions. GI:  No-   heartburn, indigestion, abdominal pain, nausea, vomiting, GU: . MS:  No-   joint pain or swelling. . Neuro-     nothing unusual Psych:  No- change in mood or affect. No depression or anxiety.  No memory loss.  OBJ- Physical Exam General- Alert, Oriented, Affect-appropriate, Distress- none acute, + does not look toxic Skin- rash-none, lesions- none, excoriation- none Lymphadenopathy- none Head- atraumatic            Eyes- Gross vision intact, PERRLA, conjunctivae and secretions clear            Ears- Hearing, canals-normal            Nose- Clear, no-Septal dev, mucus, polyps, erosion, perforation             Throat- Mallampati II , mucosa clear , drainage- none, tonsils- atrophic Neck- flexible , trachea midline, no stridor , thyroid nl, carotid no bruit Chest - symmetrical excursion , unlabored           Heart/CV-  almost RRR , no murmur , no gallop  , no rub, nl s1 s2                           - JVD- none , edema- none, stasis changes- none, varices- none           Lung-  wheeze + trace, cough + deep, rhonchi + bilateral , dullness-none, rub- none           Chest wall-  Abd- + ileostomy Br/ Gen/ Rectal- Not done, not indicated Extrem- cyanosis- none, clubbing, none, atrophy- none, strength- nl, + dusky feet Neuro- grossly intact to observation    .

## 2014-12-08 NOTE — Patient Instructions (Addendum)
Script printed for Flutter device to help pulmonary clearing  Script sent for ceftin  Scripts printed for nebulizer machine and albuterol neb solution.  Please call as needed  Order- reschedule PFT  Dx Chronic bronchitis with exacerbation

## 2014-12-09 NOTE — Assessment & Plan Note (Signed)
We discussed emphasis on pulmonary toilet Plan-flutter device, consider VEST, Ceftin to hold

## 2014-12-09 NOTE — Assessment & Plan Note (Signed)
Severe recurrent reflux with intermittent aspiration, dental enamel erosion Plan-managed by Dr. Charolotte Capuchin

## 2014-12-11 ENCOUNTER — Ambulatory Visit (HOSPITAL_BASED_OUTPATIENT_CLINIC_OR_DEPARTMENT_OTHER): Payer: 59 | Admitting: Hematology and Oncology

## 2014-12-11 ENCOUNTER — Encounter: Payer: Self-pay | Admitting: Hematology and Oncology

## 2014-12-11 ENCOUNTER — Ambulatory Visit: Payer: 59

## 2014-12-11 ENCOUNTER — Other Ambulatory Visit (HOSPITAL_BASED_OUTPATIENT_CLINIC_OR_DEPARTMENT_OTHER): Payer: 59

## 2014-12-11 ENCOUNTER — Telehealth: Payer: Self-pay | Admitting: Hematology and Oncology

## 2014-12-11 VITALS — BP 116/70 | HR 105 | Temp 98.3°F | Resp 18 | Ht 65.0 in | Wt 127.3 lb

## 2014-12-11 DIAGNOSIS — D630 Anemia in neoplastic disease: Secondary | ICD-10-CM | POA: Diagnosis not present

## 2014-12-11 DIAGNOSIS — C9310 Chronic myelomonocytic leukemia not having achieved remission: Secondary | ICD-10-CM | POA: Diagnosis not present

## 2014-12-11 DIAGNOSIS — D638 Anemia in other chronic diseases classified elsewhere: Secondary | ICD-10-CM

## 2014-12-11 DIAGNOSIS — E538 Deficiency of other specified B group vitamins: Secondary | ICD-10-CM | POA: Diagnosis not present

## 2014-12-11 DIAGNOSIS — E860 Dehydration: Secondary | ICD-10-CM

## 2014-12-11 DIAGNOSIS — Z932 Ileostomy status: Secondary | ICD-10-CM

## 2014-12-11 LAB — CBC WITH DIFFERENTIAL/PLATELET
BASO%: 1.2 % (ref 0.0–2.0)
BASOS ABS: 0.1 10*3/uL (ref 0.0–0.1)
EOS ABS: 0.1 10*3/uL (ref 0.0–0.5)
EOS%: 0.7 % (ref 0.0–7.0)
HCT: 32.8 % — ABNORMAL LOW (ref 34.8–46.6)
HEMOGLOBIN: 10.6 g/dL — AB (ref 11.6–15.9)
LYMPH%: 23.3 % (ref 14.0–49.7)
MCH: 32.7 pg (ref 25.1–34.0)
MCHC: 32.4 g/dL (ref 31.5–36.0)
MCV: 100.9 fL (ref 79.5–101.0)
MONO#: 2.9 10*3/uL — ABNORMAL HIGH (ref 0.1–0.9)
MONO%: 28.8 % — ABNORMAL HIGH (ref 0.0–14.0)
NEUT%: 46 % (ref 38.4–76.8)
NEUTROS ABS: 4.7 10*3/uL (ref 1.5–6.5)
Platelets: 241 10*3/uL (ref 145–400)
RBC: 3.25 10*6/uL — ABNORMAL LOW (ref 3.70–5.45)
RDW: 19.9 % — AB (ref 11.2–14.5)
WBC: 10.2 10*3/uL (ref 3.9–10.3)
lymph#: 2.4 10*3/uL (ref 0.9–3.3)

## 2014-12-11 LAB — TECHNOLOGIST REVIEW

## 2014-12-11 MED ORDER — DARBEPOETIN ALFA 500 MCG/ML IJ SOSY: 500.0000 ug | PREFILLED_SYRINGE | Freq: Once | INTRAMUSCULAR | Status: DC

## 2014-12-11 NOTE — Assessment & Plan Note (Signed)
Her last serum B-12 level was adequate. We will continue close monitoring. She takes oral B-12 supplement.

## 2014-12-11 NOTE — Assessment & Plan Note (Signed)
I discussed with her the risks, benefits and side effects of erythropoietin stimulating agents for treatment of anemia. She agreed to proceed. We have tried Aranesp as 500 mcg every other week with goal of hemoglobin greater than 10 and to reduce transfusion requirements. She is doing well. We will continue same treatment without dose adjustment.

## 2014-12-11 NOTE — Progress Notes (Signed)
[place] OFFICE PROGRESS NOTE  Patient Care Team: Darlin Coco, MD as PCP - General (Cardiology) Laurence Spates, MD (Gastroenterology) Garald Balding, MD (Orthopedic Surgery) Jackolyn Confer, MD as Attending Physician (General Surgery) Heath Lark, MD as Consulting Physician (Hematology and Oncology)  SUMMARY OF ONCOLOGIC HISTORY:   CMML (chronic myelomonocytic leukemia)   11/17/2011 Initial Diagnosis CMML (chronic myelomonocytic leukemia)    This patient had background history of CMML and vitamin B-12 deficiency. She also have recurrent hospitalization related to high output ileostomy and complication related to dehydration and renal failure. She also required numerous blood transfusions in the past. The patient was started on Darbopoetin injection on 01/31/2014. INTERVAL HISTORY: Please see below for problem oriented charting. She returns for further follow-up. She continues to have intermittent infection requiring anti-biotics. Her energy level is fair. She has not received blood transfusion for long time.  she continues to have chronic diarrhea, unchanged compared to baseline  REVIEW OF SYSTEMS:   Constitutional: Denies fevers, chills or abnormal weight loss Eyes: Denies blurriness of vision Ears, nose, mouth, throat, and face: Denies mucositis or sore throat Respiratory: Denies cough, dyspnea or wheezes Cardiovascular: Denies palpitation, chest discomfort or lower extremity swelling Gastrointestinal:  Denies nausea, heartburn or change in bowel habits Skin: Denies abnormal skin rashes Lymphatics: Denies new lymphadenopathy or easy bruising Neurological:Denies numbness, tingling or new weaknesses Behavioral/Psych: Mood is stable, no new changes  All other systems were reviewed with the patient and are negative.  I have reviewed the past medical history, past surgical history, social history and family history with the patient and they are unchanged from  previous note.  ALLERGIES:  is allergic to vancomycin; ativan; codeine; tetanus toxoids; penicillins; and xarelto.  MEDICATIONS:  Current Outpatient Prescriptions  Medication Sig Dispense Refill  . albuterol (PROVENTIL HFA;VENTOLIN HFA) 108 (90 BASE) MCG/ACT inhaler Inhale 1-2 puffs into the lungs every 6 (six) hours as needed for wheezing or shortness of breath. 1 Inhaler 11  . albuterol (PROVENTIL) (2.5 MG/3ML) 0.083% nebulizer solution Take 3 mLs (2.5 mg total) by nebulization every 6 (six) hours as needed for wheezing or shortness of breath. 75 mL 12  . aspirin 325 MG EC tablet Take 325 mg by mouth every morning.     . Azelastine-Fluticasone (DYMISTA) 137-50 MCG/ACT SUSP Place 2 sprays into both nostrils at bedtime. (Patient taking differently: Place 2 sprays into both nostrils as needed. ) 1 Bottle 0  . bisoprolol (ZEBETA) 5 MG tablet TAKE 1 TABLET BY MOUTH ONCE DAILY BEFORE EATING YOUR BREAKFAST 30 tablet 6  . buPROPion (WELLBUTRIN XL) 300 MG 24 hr tablet Take 1 tablet (300 mg total) by mouth daily before breakfast. 90 tablet 3  . cefUROXime (CEFTIN) 250 MG tablet Take 1 tablet (250 mg total) by mouth 2 (two) times daily with a meal. 20 tablet 5  . celecoxib (CELEBREX) 200 MG capsule Take 200 mg by mouth daily.     . cyclobenzaprine (FLEXERIL) 10 MG tablet Take 10 mg by mouth 3 (three) times daily as needed for muscle spasms.     Marland Kitchen diltiazem (CARDIZEM CD) 120 MG 24 hr capsule Take 1 capsule (120 mg total) by mouth daily. 90 capsule 3  . diphenoxylate-atropine (LOMOTIL) 2.5-0.025 MG per tablet Take 1 tablet by mouth daily as needed (for diarrhea).     . esomeprazole (NEXIUM) 40 MG capsule Take 40 mg by mouth daily at 12 noon.    Marland Kitchen estradiol (ESTRACE) 2 MG tablet Take 2 mg by  mouth daily before breakfast.     . loratadine-pseudoephedrine (CLARITIN-D 12-HOUR) 5-120 MG per tablet Take 1 tablet by mouth daily as needed for allergies.     . Nebulizers (COMPRESSOR NEBULIZER) MISC 1 Device by Does  not apply route 4 (four) times daily as needed. 1 each prn  . ondansetron (ZOFRAN-ODT) 4 MG disintegrating tablet   0  . Respiratory Therapy Supplies (FLUTTER) DEVI Blow through 4 times per set, three times daily 1 each 0  . furosemide (LASIX) 40 MG tablet Take 40 mg by mouth daily as needed (for fluid).     Marland Kitchen HYDROcodone-acetaminophen (NORCO) 10-325 MG per tablet Take 1 tablet by mouth every 4 (four) hours as needed (for pain).     . ondansetron (ZOFRAN) 4 MG tablet Take 4 mg by mouth every 8 (eight) hours as needed for nausea or vomiting.    . potassium chloride 20 MEQ TBCR Take 20 mEq by mouth 3 (three) times daily. (Patient not taking: Reported on 12/11/2014) 21 tablet 0   No current facility-administered medications for this visit.    PHYSICAL EXAMINATION: ECOG PERFORMANCE STATUS: 1 - Symptomatic but completely ambulatory  Filed Vitals:   12/11/14 1104  BP: 116/70  Pulse: 105  Temp: 98.3 F (36.8 C)  Resp: 18   Filed Weights   12/11/14 1104  Weight: 127 lb 4.8 oz (57.743 kg)    GENERAL:alert, no distress and comfortable SKIN: skin color, texture, turgor are normal, no rashes or significant lesions EYES: normal, Conjunctiva are pink and non-injected, sclera clear Musculoskeletal:no cyanosis of digits and no clubbing  NEURO: alert & oriented x 3 with fluent speech, no focal motor/sensory deficits  LABORATORY DATA:  I have reviewed the data as listed    Component Value Date/Time   NA 137 12/22/2013 0410   NA 133* 08/28/2013 1211   K 3.6* 12/22/2013 0410   K 3.7 08/28/2013 1211   CL 97 12/22/2013 0410   CL 101 02/09/2012 1321   CO2 29 12/22/2013 0410   CO2 16* 08/28/2013 1211   GLUCOSE 109* 12/22/2013 0410   GLUCOSE 79 08/28/2013 1211   GLUCOSE 79 02/09/2012 1321   BUN 9 12/22/2013 0410   BUN 8.8 08/28/2013 1211   CREATININE 0.74 12/22/2013 0410   CREATININE 0.8 08/28/2013 1211   CREATININE 0.92 08/10/2012 1528   CALCIUM 7.2* 12/22/2013 0410   CALCIUM 5.3*  11/19/2013 1000   CALCIUM 6.7* 08/28/2013 1211   PROT 5.7* 12/14/2013 0215   PROT 6.9 08/28/2013 1211   ALBUMIN 1.8* 12/22/2013 0410   ALBUMIN 2.4* 08/28/2013 1211   AST 17 12/14/2013 0215   AST 19 08/28/2013 1211   ALT 10 12/14/2013 0215   ALT 14 08/28/2013 1211   ALKPHOS 306* 12/14/2013 0215   ALKPHOS 495* 08/28/2013 1211   BILITOT 0.3 12/14/2013 0215   BILITOT 0.37 08/28/2013 1211   GFRNONAA 88* 12/22/2013 0410   GFRAA >90 12/22/2013 0410    No results found for: SPEP, UPEP  Lab Results  Component Value Date   WBC 10.2 12/11/2014   NEUTROABS 4.7 12/11/2014   HGB 10.6* 12/11/2014   HCT 32.8* 12/11/2014   MCV 100.9 12/11/2014   PLT 241 12/11/2014      Chemistry      Component Value Date/Time   NA 137 12/22/2013 0410   NA 133* 08/28/2013 1211   K 3.6* 12/22/2013 0410   K 3.7 08/28/2013 1211   CL 97 12/22/2013 0410   CL 101 02/09/2012 1321  CO2 29 12/22/2013 0410   CO2 16* 08/28/2013 1211   BUN 9 12/22/2013 0410   BUN 8.8 08/28/2013 1211   CREATININE 0.74 12/22/2013 0410   CREATININE 0.8 08/28/2013 1211   CREATININE 0.92 08/10/2012 1528      Component Value Date/Time   CALCIUM 7.2* 12/22/2013 0410   CALCIUM 5.3* 11/19/2013 1000   CALCIUM 6.7* 08/28/2013 1211   ALKPHOS 306* 12/14/2013 0215   ALKPHOS 495* 08/28/2013 1211   AST 17 12/14/2013 0215   AST 19 08/28/2013 1211   ALT 10 12/14/2013 0215   ALT 14 08/28/2013 1211   BILITOT 0.3 12/14/2013 0215   BILITOT 0.37 08/28/2013 1211      ASSESSMENT & PLAN:  CMML (chronic myelomonocytic leukemia) I discussed with her the risks, benefits and side effects of erythropoietin stimulating agents for treatment of anemia. She agreed to proceed. We have tried Aranesp as 500 mcg every other week with goal of hemoglobin greater than 10 and to reduce transfusion requirements. She is doing well. We will continue same treatment without dose adjustment.    B12 deficiency Her last serum B-12 level was adequate. We will  continue close monitoring. She takes oral B-12 supplement.    Anemia in neoplastic disease This is multifactorial, related to CMML and anemia of chronic disease. As mentioned above, she will continue with erythropoietin stimulating agents    No orders of the defined types were placed in this encounter.   All questions were answered. The patient knows to call the clinic with any problems, questions or concerns. No barriers to learning was detected. I spent 15 minutes counseling the patient face to face. The total time spent in the appointment was 20 minutes and more than 50% was on counseling and review of test results     [facility], [place], MD 12/11/2014 4:56 PM

## 2014-12-11 NOTE — Assessment & Plan Note (Signed)
This is multifactorial, related to CMML and anemia of chronic disease. As mentioned above, she will continue with erythropoietin stimulating agents

## 2014-12-11 NOTE — Telephone Encounter (Signed)
Pt confirmed labs/ov/inj per 08/25 POF, gave pt AVS and Calendar... KJ

## 2014-12-22 ENCOUNTER — Inpatient Hospital Stay (HOSPITAL_COMMUNITY)
Admission: EM | Admit: 2014-12-22 | Discharge: 2015-01-17 | DRG: 004 | Disposition: E | Payer: 59 | Attending: Pulmonary Disease | Admitting: Pulmonary Disease

## 2014-12-22 ENCOUNTER — Emergency Department (HOSPITAL_COMMUNITY): Payer: 59

## 2014-12-22 ENCOUNTER — Encounter (HOSPITAL_COMMUNITY): Payer: Self-pay | Admitting: Emergency Medicine

## 2014-12-22 ENCOUNTER — Other Ambulatory Visit (HOSPITAL_COMMUNITY): Payer: 59

## 2014-12-22 DIAGNOSIS — R34 Anuria and oliguria: Secondary | ICD-10-CM | POA: Diagnosis present

## 2014-12-22 DIAGNOSIS — E538 Deficiency of other specified B group vitamins: Secondary | ICD-10-CM | POA: Diagnosis present

## 2014-12-22 DIAGNOSIS — J9600 Acute respiratory failure, unspecified whether with hypoxia or hypercapnia: Secondary | ICD-10-CM | POA: Insufficient documentation

## 2014-12-22 DIAGNOSIS — J154 Pneumonia due to other streptococci: Secondary | ICD-10-CM | POA: Diagnosis present

## 2014-12-22 DIAGNOSIS — A4150 Gram-negative sepsis, unspecified: Principal | ICD-10-CM | POA: Diagnosis present

## 2014-12-22 DIAGNOSIS — E780 Pure hypercholesterolemia: Secondary | ICD-10-CM | POA: Diagnosis present

## 2014-12-22 DIAGNOSIS — E876 Hypokalemia: Secondary | ICD-10-CM | POA: Diagnosis present

## 2014-12-22 DIAGNOSIS — Z7982 Long term (current) use of aspirin: Secondary | ICD-10-CM | POA: Diagnosis not present

## 2014-12-22 DIAGNOSIS — D6489 Other specified anemias: Secondary | ICD-10-CM | POA: Diagnosis not present

## 2014-12-22 DIAGNOSIS — Z96649 Presence of unspecified artificial hip joint: Secondary | ICD-10-CM | POA: Diagnosis present

## 2014-12-22 DIAGNOSIS — K219 Gastro-esophageal reflux disease without esophagitis: Secondary | ICD-10-CM | POA: Diagnosis present

## 2014-12-22 DIAGNOSIS — E871 Hypo-osmolality and hyponatremia: Secondary | ICD-10-CM | POA: Diagnosis present

## 2014-12-22 DIAGNOSIS — R188 Other ascites: Secondary | ICD-10-CM | POA: Diagnosis present

## 2014-12-22 DIAGNOSIS — E162 Hypoglycemia, unspecified: Secondary | ICD-10-CM | POA: Diagnosis present

## 2014-12-22 DIAGNOSIS — Z881 Allergy status to other antibiotic agents status: Secondary | ICD-10-CM

## 2014-12-22 DIAGNOSIS — Z823 Family history of stroke: Secondary | ICD-10-CM | POA: Diagnosis not present

## 2014-12-22 DIAGNOSIS — N170 Acute kidney failure with tubular necrosis: Secondary | ICD-10-CM | POA: Diagnosis not present

## 2014-12-22 DIAGNOSIS — J9602 Acute respiratory failure with hypercapnia: Secondary | ICD-10-CM

## 2014-12-22 DIAGNOSIS — Z888 Allergy status to other drugs, medicaments and biological substances status: Secondary | ICD-10-CM

## 2014-12-22 DIAGNOSIS — I4891 Unspecified atrial fibrillation: Secondary | ICD-10-CM | POA: Diagnosis not present

## 2014-12-22 DIAGNOSIS — R6521 Severe sepsis with septic shock: Secondary | ICD-10-CM | POA: Diagnosis present

## 2014-12-22 DIAGNOSIS — I4892 Unspecified atrial flutter: Secondary | ICD-10-CM | POA: Diagnosis not present

## 2014-12-22 DIAGNOSIS — I480 Paroxysmal atrial fibrillation: Secondary | ICD-10-CM | POA: Diagnosis present

## 2014-12-22 DIAGNOSIS — J90 Pleural effusion, not elsewhere classified: Secondary | ICD-10-CM | POA: Diagnosis not present

## 2014-12-22 DIAGNOSIS — J449 Chronic obstructive pulmonary disease, unspecified: Secondary | ICD-10-CM | POA: Diagnosis present

## 2014-12-22 DIAGNOSIS — Z885 Allergy status to narcotic agent status: Secondary | ICD-10-CM | POA: Diagnosis not present

## 2014-12-22 DIAGNOSIS — E43 Unspecified severe protein-calorie malnutrition: Secondary | ICD-10-CM | POA: Diagnosis present

## 2014-12-22 DIAGNOSIS — Z90710 Acquired absence of both cervix and uterus: Secondary | ICD-10-CM | POA: Diagnosis not present

## 2014-12-22 DIAGNOSIS — Z992 Dependence on renal dialysis: Secondary | ICD-10-CM | POA: Diagnosis not present

## 2014-12-22 DIAGNOSIS — R110 Nausea: Secondary | ICD-10-CM

## 2014-12-22 DIAGNOSIS — F329 Major depressive disorder, single episode, unspecified: Secondary | ICD-10-CM | POA: Diagnosis present

## 2014-12-22 DIAGNOSIS — Z932 Ileostomy status: Secondary | ICD-10-CM

## 2014-12-22 DIAGNOSIS — J9690 Respiratory failure, unspecified, unspecified whether with hypoxia or hypercapnia: Secondary | ICD-10-CM

## 2014-12-22 DIAGNOSIS — R0602 Shortness of breath: Secondary | ICD-10-CM | POA: Diagnosis present

## 2014-12-22 DIAGNOSIS — E861 Hypovolemia: Secondary | ICD-10-CM | POA: Diagnosis present

## 2014-12-22 DIAGNOSIS — F29 Unspecified psychosis not due to a substance or known physiological condition: Secondary | ICD-10-CM

## 2014-12-22 DIAGNOSIS — R9409 Abnormal results of other function studies of central nervous system: Secondary | ICD-10-CM

## 2014-12-22 DIAGNOSIS — G8929 Other chronic pain: Secondary | ICD-10-CM | POA: Diagnosis present

## 2014-12-22 DIAGNOSIS — Y95 Nosocomial condition: Secondary | ICD-10-CM | POA: Diagnosis present

## 2014-12-22 DIAGNOSIS — C9310 Chronic myelomonocytic leukemia not having achieved remission: Secondary | ICD-10-CM | POA: Diagnosis present

## 2014-12-22 DIAGNOSIS — R109 Unspecified abdominal pain: Secondary | ICD-10-CM

## 2014-12-22 DIAGNOSIS — E872 Acidosis: Secondary | ICD-10-CM | POA: Insufficient documentation

## 2014-12-22 DIAGNOSIS — R918 Other nonspecific abnormal finding of lung field: Secondary | ICD-10-CM

## 2014-12-22 DIAGNOSIS — E874 Mixed disorder of acid-base balance: Secondary | ICD-10-CM | POA: Diagnosis present

## 2014-12-22 DIAGNOSIS — Z221 Carrier of other intestinal infectious diseases: Secondary | ICD-10-CM | POA: Diagnosis not present

## 2014-12-22 DIAGNOSIS — M7989 Other specified soft tissue disorders: Secondary | ICD-10-CM | POA: Diagnosis not present

## 2014-12-22 DIAGNOSIS — J189 Pneumonia, unspecified organism: Secondary | ICD-10-CM | POA: Diagnosis not present

## 2014-12-22 DIAGNOSIS — Z9049 Acquired absence of other specified parts of digestive tract: Secondary | ICD-10-CM | POA: Diagnosis present

## 2014-12-22 DIAGNOSIS — D638 Anemia in other chronic diseases classified elsewhere: Secondary | ICD-10-CM | POA: Diagnosis not present

## 2014-12-22 DIAGNOSIS — E875 Hyperkalemia: Secondary | ICD-10-CM | POA: Diagnosis present

## 2014-12-22 DIAGNOSIS — N179 Acute kidney failure, unspecified: Secondary | ICD-10-CM | POA: Diagnosis present

## 2014-12-22 DIAGNOSIS — J80 Acute respiratory distress syndrome: Secondary | ICD-10-CM

## 2014-12-22 DIAGNOSIS — K551 Chronic vascular disorders of intestine: Secondary | ICD-10-CM | POA: Diagnosis present

## 2014-12-22 DIAGNOSIS — Z79891 Long term (current) use of opiate analgesic: Secondary | ICD-10-CM | POA: Diagnosis not present

## 2014-12-22 DIAGNOSIS — D630 Anemia in neoplastic disease: Secondary | ICD-10-CM | POA: Diagnosis present

## 2014-12-22 DIAGNOSIS — Z8249 Family history of ischemic heart disease and other diseases of the circulatory system: Secondary | ICD-10-CM | POA: Diagnosis not present

## 2014-12-22 DIAGNOSIS — Z66 Do not resuscitate: Secondary | ICD-10-CM

## 2014-12-22 DIAGNOSIS — I10 Essential (primary) hypertension: Secondary | ICD-10-CM | POA: Diagnosis present

## 2014-12-22 DIAGNOSIS — I77819 Aortic ectasia, unspecified site: Secondary | ICD-10-CM | POA: Diagnosis present

## 2014-12-22 DIAGNOSIS — Z515 Encounter for palliative care: Secondary | ICD-10-CM | POA: Insufficient documentation

## 2014-12-22 DIAGNOSIS — Z789 Other specified health status: Secondary | ICD-10-CM

## 2014-12-22 DIAGNOSIS — T884XXA Failed or difficult intubation, initial encounter: Secondary | ICD-10-CM

## 2014-12-22 DIAGNOSIS — E860 Dehydration: Secondary | ICD-10-CM | POA: Diagnosis present

## 2014-12-22 DIAGNOSIS — R52 Pain, unspecified: Secondary | ICD-10-CM | POA: Insufficient documentation

## 2014-12-22 DIAGNOSIS — I469 Cardiac arrest, cause unspecified: Secondary | ICD-10-CM

## 2014-12-22 DIAGNOSIS — F17210 Nicotine dependence, cigarettes, uncomplicated: Secondary | ICD-10-CM | POA: Diagnosis present

## 2014-12-22 DIAGNOSIS — Z4659 Encounter for fitting and adjustment of other gastrointestinal appliance and device: Secondary | ICD-10-CM

## 2014-12-22 DIAGNOSIS — D62 Acute posthemorrhagic anemia: Secondary | ICD-10-CM | POA: Diagnosis present

## 2014-12-22 DIAGNOSIS — J948 Other specified pleural conditions: Secondary | ICD-10-CM | POA: Diagnosis not present

## 2014-12-22 DIAGNOSIS — B3749 Other urogenital candidiasis: Secondary | ICD-10-CM | POA: Diagnosis present

## 2014-12-22 DIAGNOSIS — Z880 Allergy status to penicillin: Secondary | ICD-10-CM

## 2014-12-22 DIAGNOSIS — Z978 Presence of other specified devices: Secondary | ICD-10-CM

## 2014-12-22 DIAGNOSIS — M549 Dorsalgia, unspecified: Secondary | ICD-10-CM

## 2014-12-22 DIAGNOSIS — J811 Chronic pulmonary edema: Secondary | ICD-10-CM

## 2014-12-22 DIAGNOSIS — A419 Sepsis, unspecified organism: Secondary | ICD-10-CM | POA: Diagnosis not present

## 2014-12-22 DIAGNOSIS — Z22322 Carrier or suspected carrier of Methicillin resistant Staphylococcus aureus: Secondary | ICD-10-CM

## 2014-12-22 DIAGNOSIS — D649 Anemia, unspecified: Secondary | ICD-10-CM

## 2014-12-22 DIAGNOSIS — R042 Hemoptysis: Secondary | ICD-10-CM

## 2014-12-22 DIAGNOSIS — J9601 Acute respiratory failure with hypoxia: Secondary | ICD-10-CM

## 2014-12-22 DIAGNOSIS — Z9911 Dependence on respirator [ventilator] status: Secondary | ICD-10-CM

## 2014-12-22 DIAGNOSIS — Z79899 Other long term (current) drug therapy: Secondary | ICD-10-CM

## 2014-12-22 DIAGNOSIS — M1990 Unspecified osteoarthritis, unspecified site: Secondary | ICD-10-CM | POA: Diagnosis present

## 2014-12-22 DIAGNOSIS — Z6824 Body mass index (BMI) 24.0-24.9, adult: Secondary | ICD-10-CM | POA: Diagnosis not present

## 2014-12-22 DIAGNOSIS — R0902 Hypoxemia: Secondary | ICD-10-CM

## 2014-12-22 DIAGNOSIS — R197 Diarrhea, unspecified: Secondary | ICD-10-CM | POA: Diagnosis not present

## 2014-12-22 DIAGNOSIS — R58 Hemorrhage, not elsewhere classified: Secondary | ICD-10-CM

## 2014-12-22 LAB — CBC WITH DIFFERENTIAL/PLATELET
BASOS PCT: 0 % (ref 0–1)
Basophils Absolute: 0 10*3/uL (ref 0.0–0.1)
EOS PCT: 0 % (ref 0–5)
Eosinophils Absolute: 0 10*3/uL (ref 0.0–0.7)
HCT: 28.2 % — ABNORMAL LOW (ref 36.0–46.0)
HEMOGLOBIN: 9.1 g/dL — AB (ref 12.0–15.0)
Lymphocytes Relative: 22 % (ref 12–46)
Lymphs Abs: 2 10*3/uL (ref 0.7–4.0)
MCH: 32.5 pg (ref 26.0–34.0)
MCHC: 32.3 g/dL (ref 30.0–36.0)
MCV: 100.7 fL — AB (ref 78.0–100.0)
MONO ABS: 3.3 10*3/uL — AB (ref 0.1–1.0)
Monocytes Relative: 37 % — ABNORMAL HIGH (ref 3–12)
NEUTROS ABS: 3.7 10*3/uL (ref 1.7–7.7)
NEUTROS PCT: 41 % — AB (ref 43–77)
PLATELETS: 209 10*3/uL (ref 150–400)
RBC: 2.8 MIL/uL — ABNORMAL LOW (ref 3.87–5.11)
RDW: 19.4 % — ABNORMAL HIGH (ref 11.5–15.5)
WBC: 9 10*3/uL (ref 4.0–10.5)

## 2014-12-22 LAB — BLOOD GAS, ARTERIAL
ACID-BASE DEFICIT: 11 mmol/L — AB (ref 0.0–2.0)
BICARBONATE: 15 meq/L — AB (ref 20.0–24.0)
Drawn by: 307971
O2 CONTENT: 3 L/min
O2 Saturation: 88 %
PCO2 ART: 36.1 mmHg (ref 35.0–45.0)
PO2 ART: 69 mmHg — AB (ref 80.0–100.0)
Patient temperature: 98.6
TCO2: 14.6 mmol/L (ref 0–100)
pH, Arterial: 7.243 — ABNORMAL LOW (ref 7.350–7.450)

## 2014-12-22 LAB — COMPREHENSIVE METABOLIC PANEL
ALT: 9 U/L — AB (ref 14–54)
AST: 10 U/L — AB (ref 15–41)
Albumin: 2.7 g/dL — ABNORMAL LOW (ref 3.5–5.0)
Alkaline Phosphatase: 140 U/L — ABNORMAL HIGH (ref 38–126)
Anion gap: 7 (ref 5–15)
BILIRUBIN TOTAL: 0.6 mg/dL (ref 0.3–1.2)
BUN: 38 mg/dL — AB (ref 6–20)
CALCIUM: 6.9 mg/dL — AB (ref 8.9–10.3)
CO2: 18 mmol/L — ABNORMAL LOW (ref 22–32)
CREATININE: 1.72 mg/dL — AB (ref 0.44–1.00)
Chloride: 107 mmol/L (ref 101–111)
GFR, EST AFRICAN AMERICAN: 35 mL/min — AB (ref >60)
GFR, EST NON AFRICAN AMERICAN: 30 mL/min — AB (ref >60)
Glucose, Bld: 97 mg/dL (ref 65–99)
Potassium: 5.4 mmol/L — ABNORMAL HIGH (ref 3.5–5.1)
Sodium: 132 mmol/L — ABNORMAL LOW (ref 135–145)
TOTAL PROTEIN: 5 g/dL — AB (ref 6.5–8.1)

## 2014-12-22 LAB — C DIFFICILE QUICK SCREEN W PCR REFLEX
C DIFFICLE (CDIFF) ANTIGEN: POSITIVE — AB
C Diff toxin: NEGATIVE

## 2014-12-22 LAB — PHOSPHORUS: Phosphorus: 5.4 mg/dL — ABNORMAL HIGH (ref 2.5–4.6)

## 2014-12-22 LAB — I-STAT TROPONIN, ED: Troponin i, poc: 0 ng/mL (ref 0.00–0.08)

## 2014-12-22 LAB — I-STAT CG4 LACTIC ACID, ED: Lactic Acid, Venous: 1.1 mmol/L (ref 0.5–2.0)

## 2014-12-22 LAB — MAGNESIUM: MAGNESIUM: 0.9 mg/dL — AB (ref 1.7–2.4)

## 2014-12-22 MED ORDER — LEVOFLOXACIN IN D5W 750 MG/150ML IV SOLN
750.0000 mg | Freq: Once | INTRAVENOUS | Status: DC
  Filled 2014-12-22: qty 150

## 2014-12-22 MED ORDER — ALBUTEROL SULFATE (2.5 MG/3ML) 0.083% IN NEBU
2.5000 mg | INHALATION_SOLUTION | Freq: Once | RESPIRATORY_TRACT | Status: AC
  Administered 2014-12-22: 2.5 mg via RESPIRATORY_TRACT
  Filled 2014-12-22: qty 3

## 2014-12-22 MED ORDER — FENTANYL CITRATE (PF) 100 MCG/2ML IJ SOLN
50.0000 ug | Freq: Once | INTRAMUSCULAR | Status: AC
Start: 2014-12-22 — End: 2014-12-22
  Administered 2014-12-22: 50 ug via INTRAVENOUS
  Filled 2014-12-22: qty 2

## 2014-12-22 MED ORDER — ESTRADIOL 2 MG PO TABS
2.0000 mg | ORAL_TABLET | Freq: Every day | ORAL | Status: DC
Start: 2014-12-23 — End: 2014-12-24
  Administered 2014-12-23: 2 mg via ORAL
  Filled 2014-12-22 (×4): qty 1

## 2014-12-22 MED ORDER — ENOXAPARIN SODIUM 30 MG/0.3ML ~~LOC~~ SOLN
30.0000 mg | SUBCUTANEOUS | Status: DC
Start: 2014-12-22 — End: 2014-12-23
  Administered 2014-12-22: 30 mg via SUBCUTANEOUS
  Filled 2014-12-22: qty 0.3

## 2014-12-22 MED ORDER — ALBUTEROL SULFATE (2.5 MG/3ML) 0.083% IN NEBU: 1.2500 mg | INHALATION_SOLUTION | RESPIRATORY_TRACT | Status: DC | PRN

## 2014-12-22 MED ORDER — LINEZOLID 600 MG/300ML IV SOLN
600.0000 mg | Freq: Two times a day (BID) | INTRAVENOUS | Status: DC
  Administered 2014-12-22 – 2014-12-26 (×8): 600 mg via INTRAVENOUS
  Filled 2014-12-22 (×9): qty 300

## 2014-12-22 MED ORDER — DEXTROSE 5 % IV SOLN
2.0000 g | Freq: Every day | INTRAVENOUS | Status: DC
  Administered 2014-12-22: 2 g via INTRAVENOUS
  Filled 2014-12-22: qty 2

## 2014-12-22 MED ORDER — BUPROPION HCL ER (XL) 300 MG PO TB24
300.0000 mg | ORAL_TABLET | Freq: Every day | ORAL | Status: DC
  Administered 2014-12-23: 300 mg via ORAL
  Filled 2014-12-22: qty 1

## 2014-12-22 MED ORDER — CELECOXIB 200 MG PO CAPS
200.0000 mg | ORAL_CAPSULE | Freq: Every day | ORAL | Status: DC
  Administered 2014-12-23: 200 mg via ORAL
  Filled 2014-12-22 (×2): qty 1

## 2014-12-22 MED ORDER — SODIUM CHLORIDE 0.9 % IV SOLN
INTRAVENOUS | Status: AC
  Administered 2014-12-22: 100 mL via INTRAVENOUS
  Administered 2014-12-23: 20:00:00 via INTRAVENOUS

## 2014-12-22 MED ORDER — PANTOPRAZOLE SODIUM 40 MG PO TBEC
40.0000 mg | DELAYED_RELEASE_TABLET | Freq: Two times a day (BID) | ORAL | Status: DC
  Administered 2014-12-22 – 2014-12-23 (×2): 40 mg via ORAL
  Filled 2014-12-22 (×2): qty 1

## 2014-12-22 MED ORDER — IPRATROPIUM BROMIDE 0.02 % IN SOLN: 0.5000 mg | Freq: Four times a day (QID) | RESPIRATORY_TRACT | Status: DC

## 2014-12-22 MED ORDER — MAGNESIUM SULFATE 4 GM/100ML IV SOLN
4.0000 g | Freq: Once | INTRAVENOUS | Status: AC
  Administered 2014-12-22: 4 g via INTRAVENOUS
  Filled 2014-12-22: qty 100

## 2014-12-22 MED ORDER — BUDESONIDE 0.25 MG/2ML IN SUSP
0.2500 mg | Freq: Two times a day (BID) | RESPIRATORY_TRACT | Status: DC
  Administered 2014-12-22 – 2014-12-28 (×12): 0.25 mg via RESPIRATORY_TRACT
  Filled 2014-12-22 (×12): qty 2

## 2014-12-22 MED ORDER — ALBUTEROL (5 MG/ML) CONTINUOUS INHALATION SOLN: 5.0000 mg/h | INHALATION_SOLUTION | Freq: Once | RESPIRATORY_TRACT | Status: DC

## 2014-12-22 MED ORDER — ASPIRIN EC 325 MG PO TBEC
325.0000 mg | DELAYED_RELEASE_TABLET | Freq: Every morning | ORAL | Status: DC
  Administered 2014-12-23 – 2014-12-27 (×4): 325 mg via ORAL
  Filled 2014-12-22 (×4): qty 1

## 2014-12-22 MED ORDER — CYCLOBENZAPRINE HCL 10 MG PO TABS
10.0000 mg | ORAL_TABLET | Freq: Three times a day (TID) | ORAL | Status: DC | PRN
  Administered 2014-12-23 – 2015-01-02 (×5): 10 mg via ORAL
  Filled 2014-12-22 (×5): qty 1

## 2014-12-22 MED ORDER — SODIUM CHLORIDE 0.9 % IV BOLUS (SEPSIS)
1000.0000 mL | Freq: Once | INTRAVENOUS | Status: AC
  Administered 2014-12-22: 1000 mL via INTRAVENOUS

## 2014-12-22 MED ORDER — ONDANSETRON HCL 4 MG PO TABS: 4.0000 mg | ORAL_TABLET | Freq: Four times a day (QID) | ORAL | Status: DC | PRN

## 2014-12-22 MED ORDER — BISOPROLOL FUMARATE 5 MG PO TABS
5.0000 mg | ORAL_TABLET | Freq: Every day | ORAL | Status: DC
  Administered 2014-12-23: 5 mg via ORAL
  Filled 2014-12-22: qty 1

## 2014-12-22 MED ORDER — DILTIAZEM HCL ER COATED BEADS 120 MG PO CP24
120.0000 mg | ORAL_CAPSULE | Freq: Every day | ORAL | Status: DC
  Administered 2014-12-23: 120 mg via ORAL
  Filled 2014-12-22: qty 1

## 2014-12-22 MED ORDER — HYDROCODONE-ACETAMINOPHEN 10-325 MG PO TABS
1.0000 | ORAL_TABLET | ORAL | Status: DC | PRN
  Administered 2014-12-22 – 2014-12-23 (×2): 1 via ORAL
  Filled 2014-12-22 (×2): qty 1

## 2014-12-22 MED ORDER — ONDANSETRON HCL 4 MG/2ML IJ SOLN
4.0000 mg | Freq: Four times a day (QID) | INTRAMUSCULAR | Status: DC | PRN
  Administered 2014-12-22 – 2015-01-10 (×16): 4 mg via INTRAVENOUS
  Filled 2014-12-22 (×18): qty 2

## 2014-12-22 MED ORDER — ALBUTEROL SULFATE (2.5 MG/3ML) 0.083% IN NEBU: 2.5000 mg | INHALATION_SOLUTION | Freq: Four times a day (QID) | RESPIRATORY_TRACT | Status: DC

## 2014-12-22 MED ORDER — IPRATROPIUM-ALBUTEROL 0.5-2.5 (3) MG/3ML IN SOLN
3.0000 mL | Freq: Four times a day (QID) | RESPIRATORY_TRACT | Status: DC
  Administered 2014-12-22 – 2015-01-05 (×55): 3 mL via RESPIRATORY_TRACT
  Filled 2014-12-22 (×54): qty 3

## 2014-12-22 NOTE — Progress Notes (Signed)
ANTIBIOTIC CONSULT NOTE - INITIAL  Pharmacy Consult for Ceftazidime Indication: HCAP  Allergies  Allergen Reactions  . Vancomycin Hives and Rash    wheezing  . Ativan [Lorazepam] Other (See Comments)    "makes me crazy"  . Codeine Nausea And Vomiting  . Tetanus Toxoids Other (See Comments)    Serum reaction  . Penicillins Hives and Rash  . Xarelto [Rivaroxaban] Hives and Rash    (May not be allergic. Vancomycin was also being taken when reaction occurred.)    Patient Measurements:    Vital Signs: Temp: 98.8 F (37.1 C) (09/05 1431) Temp Source: Oral (09/05 1431) BP: 108/63 mmHg (09/05 1830) Pulse Rate: 59 (09/05 1431) Intake/Output from previous day:   Intake/Output from this shift:    Labs:  Recent Labs  12/19/2014 1534  WBC 9.0  HGB 9.1*  PLT 209  CREATININE 1.72*   Estimated Creatinine Clearance: 29.3 mL/min (by C-G formula based on Cr of 1.72). No results for input(s): VANCOTROUGH, VANCOPEAK, VANCORANDOM, GENTTROUGH, GENTPEAK, GENTRANDOM, TOBRATROUGH, TOBRAPEAK, TOBRARND, AMIKACINPEAK, AMIKACINTROU, AMIKACIN in the last 72 hours.   Microbiology: Recent Results (from the past 720 hour(s))  TECHNOLOGIST REVIEW     Status: None   Collection Time: 11/27/14 11:07 AM  Result Value Ref Range Status   Technologist Review   Final    Metas and Myelocytes present, 4%nRBCs , many teardrops  TECHNOLOGIST REVIEW     Status: None   Collection Time: 12/11/14 10:49 AM  Result Value Ref Range Status   Technologist Review   Final    Metas and Myelocytes, 1% nrbc, mod teardrops and ovalocytes, few shistocytes present    Medical History: Past Medical History  Diagnosis Date  . Hypertension     She has a past hx of essential  . 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 Inflammation of joint of knee     Since we last last saw her she developed problems with an acute which required surgical  drainage by her orthopedist Dr. Durward Fortes.  Marland Kitchen MRSA (methicillin resistant Staphylococcus aureus)     Knee surgery drainage was positive for MRSA and she was treated with 3 weeks of doxycycline successfully.  . Diarrhea     Mild  . Exogenous obesity   . GERD (gastroesophageal reflux disease)     2 hosp.- ischemic colitis - residual of Norovirus, 05/2011- sm. bowel obstruction  . Headache(784.0)     migraine headache on occas, less now than when she was younger   . Arthritis     L hip, back, neck   . History of blood transfusion sept 2013    04/2011- /w ischemic colitis , trouble with matching blood  sept 2013  . Anemia     will see hematology consult prior to surgery, recommended by Dr. Mare Ferrari  . Anemia 12/15/2010  . Ischemic colitis 01/31/2012  . Atrial flutter     during hospitalization, 04/2011- related to anemia & illness/stress   . Pneumonia     04/2011- not hospitalized , pt. denies SOB, changes in chest, breathing  . 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 Dizziness     occasional  . B12 deficiency 02/27/2013  . 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 COPD (chronic obstructive pulmonary disease) 09/26/2013    Medications:  Anti-infectives    Start     Dose/Rate Route Frequency Ordered  Stop   01/03/2015 2000  linezolid (ZYVOX) IVPB 600 mg     600 mg 300 mL/hr over 60 Minutes Intravenous Every 12 hours 12/20/2014 1821     01/10/2015 1845  cefTAZidime (FORTAZ) 2 g in dextrose 5 % 50 mL IVPB     2 g 100 mL/hr over 30 Minutes Intravenous Daily 12/26/2014 1839     01/08/2015 1745  levofloxacin (LEVAQUIN) IVPB 750 mg  Status:  Discontinued     750 mg 100 mL/hr over 90 Minutes Intravenous  Once 12/26/2014 1744 12/24/2014 1820     Assessment: 64 year old female presents with increased shortness of breath over the last 4 days associated which chest pain. She's been treated multiple times for pneumonia with her primary care physician recently. She has been admitted to  the hospital previously with ARDS and multifocal pneumonia.  Allergy to vancomycin with wheezing noted. Also allergic to PCNs, but has tolerated numerous cephalosporins.  ID ok with using Zyvox, as vanco and dapto are contraindicated.  Received Levofloxacin x 1 in ED.  9/5 >> Levaquin x 1 9/5 >> Ceftazidime >> 9/5 >> Zyvox (MD) >>    Temp: afebrile WBC: wnl Renal: SCr elevated; CrCl 29 CG  9/5 blood: IP   Goal of Therapy:  Eradication of infection Appropriate antibiotic dosing for indication and renal function  Plan:  Day 1 antibiotics  Ceftazidime 2g IV q24 hr  Monitor for Zyvox ADEs  Follow clinical course, renal function, culture results as available  Follow for de-escalation of antibiotics and LOT   Reuel Boom, PharmD, BCPS Pager: [phone] 12/29/2014, 6:49 PM

## 2014-12-22 NOTE — ED Notes (Signed)
Pt refused second set of blood cultures.  One set drawn by this RN from existing ultrasound placed 20g IV site in [place].  RN notifed Dr. Laneta Simmers of Pts wishes regarding her respiratory status.  Dr. Laneta Simmers bedside to discuss with Pt.  Pt is able to talk easily and is not in any distress at this time.  Pt states she is Full Code.

## 2014-12-22 NOTE — ED Notes (Signed)
RN bedside for ultrasound IV placement and blood draw.

## 2014-12-22 NOTE — ED Notes (Signed)
Bed: WA11 Expected date:  Expected time:  Means of arrival:  Comments: Triage 1 

## 2014-12-22 NOTE — ED Notes (Addendum)
Pt c/o chest pain onset 4 days ago, SOB onset 14 days ago. Significant rhonchi and expiratory wheezing throughout. Pt has Hx of COPD and cigarette smoking.  Pt had chest x-ray in July showing pneumonia.

## 2014-12-22 NOTE — ED Notes (Signed)
Pt requesting to have blood drawn with IV start, Delay in lab results explained to pt. Rn aware.

## 2014-12-22 NOTE — ED Notes (Signed)
Patient transported to X-ray 

## 2014-12-22 NOTE — ED Notes (Signed)
Patient transported to CT 

## 2014-12-22 NOTE — Progress Notes (Signed)
CRITICAL VALUE ALERT  Critical value received: Mag 0.9  Date of notification:  01/09/2015  Time of notification:  2034  Critical value read back:Yes.    Nurse who received alert:  Jari Favre RN  MD notified (1st page):  Lamar Blinks, NP  Time of first page:  2100  MD notified (2nd page):  Time of second page:  Responding MD:  Lamar Blinks, NP  Time MD responded:  2115, new order received

## 2014-12-22 NOTE — ED Provider Notes (Signed)
CSN: [id number]     Arrival date & time 01/08/2015  1416 History   First MD Initiated Contact with Patient 12/25/2014 1521     Chief Complaint  Patient presents with  . Chest Pain  . Pneumonia     (Consider location/radiation/quality/duration/timing/severity/associated sxs/prior Treatment) Patient is a 64 y.o. female presenting with shortness of breath. The history is provided by the patient.  Shortness of Breath Severity:  Moderate Onset quality:  Gradual Duration:  2 weeks Timing:  Constant Progression:  Worsening Chronicity:  Recurrent Relieved by:  Nothing Worsened by:  Nothing tried Ineffective treatments:  None tried Associated symptoms: chest pain, cough and wheezing   Associated symptoms: no fever   Cough:    Cough characteristics:  Non-productive   Past Medical History  Diagnosis Date  . Hypertension     She has a past hx of essential  . 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 Inflammation of joint of knee     Since we last last saw her she developed problems with an acute which required surgical drainage by her orthopedist Dr. Durward Fortes.  Marland Kitchen MRSA (methicillin resistant Staphylococcus aureus)     Knee surgery drainage was positive for MRSA and she was treated with 3 weeks of doxycycline successfully.  . Diarrhea     Mild  . Exogenous obesity   . GERD (gastroesophageal reflux disease)     2 hosp.- ischemic colitis - residual of Norovirus, 05/2011- sm. bowel obstruction  . Headache(784.0)     migraine headache on occas, less now than when she was younger   . Arthritis     L hip, back, neck   . History of blood transfusion sept 2013    04/2011- /w ischemic colitis , trouble with matching blood  sept 2013  . Anemia     will see hematology consult prior to surgery, recommended by Dr. Mare Ferrari  . Anemia 12/15/2010  . Ischemic colitis 01/31/2012  . Atrial flutter     during hospitalization,  04/2011- related to anemia & illness/stress   . Pneumonia     04/2011- not hospitalized , pt. denies SOB, changes in chest, breathing  . 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 Dizziness     occasional  . B12 deficiency 02/27/2013  . 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 COPD (chronic obstructive pulmonary disease) 09/26/2013   Past Surgical History  Procedure Laterality Date  . Knee surgery      I&D- 2008, post laceration   . Colonoscopy  05/16/2011    Procedure: COLONOSCOPY;  Surgeon: Winfield Cunas., MD;  Location: [facility];  Service: Endoscopy;  Laterality: N/A;  . Small intestine surgery  1992, 1999  . Laparotomy and lysis of adhesions    . Total hip arthroplasty  10/25/2011    Procedure: TOTAL HIP ARTHROPLASTY;  Surgeon: Garald Balding, MD;  Location: [facility];  Service: Orthopedics;  Laterality: Left;  . Appendectomy  1962  . Abdominal hysterectomy  1988  . Partial colectomy and colostomy  sept 2013    mucous fistula done  . Partial colectomy  05/17/2012    Procedure: PARTIAL COLECTOMY;  Surgeon: Odis Hollingshead, MD;  Location: [facility];  Service: General;  Laterality: N/A;  . Colostomy closure  05/17/2012    Procedure: COLOSTOMY CLOSURE;  Surgeon: Odis Hollingshead, MD;  Location: [facility];  Service: General;  Laterality: N/A;  . Laparotomy  05/17/2012    Procedure: EXPLORATORY LAPAROTOMY;  Surgeon: Odis Hollingshead, MD;  Location: [facility];  Service: General;;  . Lysis of adhesion  05/17/2012    Procedure: LYSIS OF ADHESION;  Surgeon: Odis Hollingshead, MD;  Location: [facility];  Service: General;;  . Esophageal biopsy  05/17/2012    Procedure: BIOPSY;  Surgeon: Odis Hollingshead, MD;  Location: [facility];  Service: General;;  omental biopsy  . Laparotomy  05/22/2012    Procedure: EXPLORATORY LAPAROTOMY;  Surgeon: Odis Hollingshead, MD;  Location: [facility];  Service: General;  Laterality: N/A;  DRAINAGE  INTRA-ABDOMINAL ABSCESS/LYSIS OF ADHESIONSFOR SMALL BOWEL  OBSTRUCTION/DIVERTING LOOP ILEOSTOMY  . Tee without cardioversion N/A 11/25/2013    Procedure: TRANSESOPHAGEAL ECHOCARDIOGRAM (TEE);  Surgeon: Dorothy Spark, MD;  Location: [facility];  Service: Cardiovascular;  Laterality: N/A;   Family History  Problem Relation Age of Onset  . Stroke Father   . Atrial fibrillation Father   . Hypertension Mother    Social History  Substance Use Topics  . Smoking status: Former Smoker -- 0.25 packs/day for 20 years    Types: Cigarettes    Quit date: 03/21/2013  . Smokeless tobacco: Never Used  . Alcohol Use: No     Comment: 1 - 2 glasses of wine per week   OB History    No data available     Review of Systems  Constitutional: Negative for fever.  Respiratory: Positive for cough, shortness of breath and wheezing.   Cardiovascular: Positive for chest pain.  All other systems reviewed and are negative.     Allergies  Vancomycin; Ativan; Codeine; Tetanus toxoids; Penicillins; and Xarelto  Home Medications   Prior to Admission medications   Medication Sig Start Date End Date Taking? Authorizing Provider  albuterol (PROVENTIL HFA;VENTOLIN HFA) 108 (90 BASE) MCG/ACT inhaler Inhale 1-2 puffs into the lungs every 6 (six) hours as needed for wheezing or shortness of breath. 08/08/14  Yes Deneise Lever, MD  albuterol (PROVENTIL) (2.5 MG/3ML) 0.083% nebulizer solution Take 3 mLs (2.5 mg total) by nebulization every 6 (six) hours as needed for wheezing or shortness of breath. 12/08/14  Yes Deneise Lever, MD  aspirin 325 MG EC tablet Take 325 mg by mouth every morning.    Yes Historical Provider, MD  bisoprolol (ZEBETA) 5 MG tablet TAKE 1 TABLET BY MOUTH ONCE DAILY BEFORE EATING YOUR BREAKFAST Patient taking differently: TAKE 5 MG BY MOUTH ONCE DAILY BEFORE EATING YOUR BREAKFAST 11/11/14  Yes Darlin Coco, MD  buPROPion (WELLBUTRIN XL) 300 MG 24 hr tablet Take 1 tablet (300 mg total) by mouth daily before breakfast. 05/27/14  Yes Darlin Coco, MD  cefUROXime (CEFTIN) 250 MG tablet Take 1 tablet (250 mg total) by mouth 2 (two) times daily with a meal. 12/08/14  Yes Deneise Lever, MD  celecoxib (CELEBREX) 200 MG capsule Take 200 mg by mouth daily.  12/25/13  Yes Historical Provider, MD  cyclobenzaprine (FLEXERIL) 10 MG tablet Take 10 mg by mouth 3 (three) times daily as needed for muscle spasms.  07/12/13  Yes Historical Provider, MD  diltiazem (CARDIZEM CD) 120 MG 24 hr capsule Take 1 capsule (120 mg total) by mouth daily. 05/27/14  Yes Darlin Coco, MD  diphenoxylate-atropine (LOMOTIL) 2.5-0.025 MG per tablet Take 1 tablet by mouth daily as needed (for diarrhea).  12/29/13  Yes Historical Provider, MD  esomeprazole (NEXIUM) 40 MG capsule Take 40 mg  by mouth daily at 12 noon.   Yes Historical Provider, MD  estradiol (ESTRACE) 2 MG tablet Take 2 mg by mouth daily before breakfast.    Yes Historical Provider, MD  furosemide (LASIX) 40 MG tablet Take 40 mg by mouth daily as needed (for fluid).    Yes Historical Provider, MD  HYDROcodone-acetaminophen (NORCO) 10-325 MG per tablet Take 1 tablet by mouth every 4 (four) hours as needed (for pain).  11/15/13  Yes Historical Provider, MD  loratadine-pseudoephedrine (CLARITIN-D 12-HOUR) 5-120 MG per tablet Take 1 tablet by mouth daily as needed for allergies.    Yes Historical Provider, MD  Nebulizers (COMPRESSOR NEBULIZER) MISC 1 Device by Does not apply route 4 (four) times daily as needed. 12/08/14  Yes Deneise Lever, MD  ondansetron (ZOFRAN) 4 MG tablet Take 4 mg by mouth every 8 (eight) hours as needed for nausea or vomiting.   Yes Historical Provider, MD  Respiratory Therapy Supplies (FLUTTER) DEVI Blow through 4 times per set, three times daily 12/08/14  Yes Deneise Lever, MD  Azelastine-Fluticasone [facility]) 137-50 MCG/ACT SUSP Place 2 sprays into both nostrils at bedtime. Patient not taking: Reported on 01/04/2015 06/12/14   Deneise Lever, MD  potassium chloride 20 MEQ TBCR Take 20  mEq by mouth 3 (three) times daily. Patient not taking: Reported on 12/11/2014 12/22/13   Verlee Monte, MD   BP 107/54 mmHg  Pulse 59  Temp(Src) 98.8 F (37.1 C) (Oral)  Resp 20  SpO2 89% Physical Exam  Constitutional: She is oriented to person, place, and time. She appears well-developed and well-nourished. No distress.  HENT:  Head: Normocephalic.  Eyes: Conjunctivae are normal.  Neck: Neck supple. No tracheal deviation present.  Cardiovascular: Normal rate, regular rhythm and normal heart sounds.  Exam reveals no distant heart sounds.   No murmur heard. Pulmonary/Chest: Effort normal. No accessory muscle usage. No respiratory distress. She has decreased breath sounds. She has wheezes (diffuse inspiratory and expiratory).  Abdominal: Soft. She exhibits no distension. There is no tenderness.  Neurological: She is alert and oriented to person, place, and time. GCS eye subscore is 4. GCS verbal subscore is 5. GCS motor subscore is 6.  Skin: Skin is warm and dry. There is cyanosis (peripheral). There is pallor.  Psychiatric: She has a normal mood and affect.    ED Course  Procedures (including critical care time) CRITICAL CARE Performed by: Leo Grosser Total critical care time: 30 Critical care time was exclusive of separately billable procedures and treating other patients. Critical care was necessary to treat or prevent imminent or life-threatening deterioration. Critical care was time spent personally by me on the following activities: development of treatment plan with patient and/or surrogate as well as nursing, discussions with consultants, evaluation of patient's response to treatment, examination of patient, obtaining history from patient or surrogate, ordering and performing treatments and interventions, ordering and review of laboratory studies, ordering and review of radiographic studies, pulse oximetry and re-evaluation of patient's condition.   Procedure note: Ultrasound  Guided Peripheral IV Ultrasound guided peripheral 1.88 inch angiocath IV placement performed by me. Indications: Nursing unable to place IV. Details: The antecubital fossa and upper arm were evaluated with a multifrequency linear probe. Patent brachial veins were noted. 1 attempt was made to cannulate a vein under realtime US guidance with successful cannulation of the vein and catheter placement. There is return of non-pulsatile dark red blood. The patient tolerated the procedure well without complications. Images archived electronically.  CPT codes: [id number] and 03546  Labs Review Labs Reviewed  CBC WITH DIFFERENTIAL/PLATELET - Abnormal; Notable for the following:    RBC 2.80 (*)    Hemoglobin 9.1 (*)    HCT 28.2 (*)    MCV 100.7 (*)    RDW 19.4 (*)    Neutrophils Relative % 41 (*)    Monocytes Relative 37 (*)    Monocytes Absolute 3.3 (*)    All other components within normal limits  COMPREHENSIVE METABOLIC PANEL - Abnormal; Notable for the following:    Sodium 132 (*)    Potassium 5.4 (*)    CO2 18 (*)    BUN 38 (*)    Creatinine, Ser 1.72 (*)    Calcium 6.9 (*)    Total Protein 5.0 (*)    Albumin 2.7 (*)    AST 10 (*)    ALT 9 (*)    Alkaline Phosphatase 140 (*)    GFR calc non Af Amer 30 (*)    GFR calc Af Amer 35 (*)    All other components within normal limits  BLOOD GAS, ARTERIAL - Abnormal; Notable for the following:    pH, Arterial 7.243 (*)    pO2, Arterial 69.0 (*)    Bicarbonate 15.0 (*)    Acid-base deficit 11.0 (*)    All other components within normal limits  CULTURE, BLOOD (ROUTINE X 2)  CULTURE, BLOOD (ROUTINE X 2)  I-STAT CG4 LACTIC ACID, ED  Randolm Idol, ED    Imaging Review Dg Chest 2 View  12/31/2014   CLINICAL DATA:  Chest pain and short of breath.  EXAM: CHEST  2 VIEW  COMPARISON:  Radiograph 10/01/2014  FINDINGS: Stable cardiac silhouette. There is interval increase in bilateral patchy airspace disease with lower lobe predominance. Small  pleural effusions. No pneumothorax.  IMPRESSION: Bilateral patchy dense severe airspace disease concerning for multifocal pneumonia versus flash pulmonary edema.   Electronically Signed   By: Suzy Bouchard M.D.   On: 12/23/2014 16:54   Ct Chest Wo Contrast  01/02/2015   CLINICAL DATA:  Pneumonia versus pulmonary edema on chest radiograph.  EXAM: CT CHEST WITHOUT CONTRAST  TECHNIQUE: Multidetector CT imaging of the chest was performed following the standard protocol without IV contrast.  COMPARISON:  Chest radiograph 12/30/2014  FINDINGS: Mediastinum/Nodes: No axillary or supraclavicular adenopathy. There are multiple mediastinal lymph nodes present. Largest in the subcarinal location measuring 2.2 cm. RIGHT lower paratracheal lymph node measures 1.6 cm. The bilateral hilar lymph nodes. No pericardial fluid. Coronary calcifications noted. Esophagus is normal.  Lungs/Pleura: There is dense lower lobe airspace disease which reaches confluence. This also involvement of the RIGHT middle lobe and lingula. Airspace disease extends to the upper lobes additionally. Discrete nodule in the RIGHT upper lobe measures 10 mm. Nodule in lobe LEFT upper lobe measures 14 mm.  Upper abdomen: Spleen appears enlarged. Adrenal glands are normal. No upper abdominal adenopathy identified.  Musculoskeletal: Degenerate change in the spine. No aggressive osseous lesion.  IMPRESSION: 1. Dense diffuse bilateral airspace disease or reaching confluence/ consolidation and involving all the lobes of LEFT and RIGHT lung. Several discrete nodules are present in the upper lobes. 2. Bulky mediastinal adenopathy. 3. This combination of dense nodular consolidation in the lungs and bulky mediastinal adenopathy raises the concern for malignancy such as lymphoma.   Electronically Signed   By: Suzy Bouchard M.D.   On: 01/11/2015 18:36   I have personally reviewed and evaluated these images and lab results  as part of my medical decision-making.    EKG Interpretation   Date/Time:  Monday December 22 2014 14:29:55 EDT Ventricular Rate:  114 PR Interval:  180 QRS Duration: 91 QT Interval:  326 QTC Calculation: 449 R Axis:   -33 Text Interpretation:  Sinus tachycardia Left axis deviation Low voltage,  precordial leads Consider anterior infarct Minimal ST depression, lateral  leads Since last tracing rhythym now sinus with similar rate Confirmed by  Eulis Foster  MD, Vira Agar ([id number]) on 01/05/2015 3:48:40 PM      MDM   Final diagnoses:  ARDS (adult respiratory distress syndrome)  CAP (community acquired pneumonia)  Acute respiratory failure with hypoxia    64 year old female presents with increased shortness of breath over the last 4 days associated which chest pain. She's been treated multiple times for pneumonia with her primary care physician recently. She has been admitted to the hospital previously with ARDS and multifocal pneumonia. I'm concerned clinically for pulmonary emboli given her tachycardia, chest pain, recurrent infection without leukocytosis or fever currently. Unable to perform CT for pulmonary embolism due to declining renal function. Possibility of volume depletion versus severe sepsis with evidence of end organ damage.   CT without contrast concerning for developing ARDS or new oncologic diagnosis. Initially dropping in the 70s without oxygen and recovering to mid 80s, currently refusing BiPAP until she is able to receive albuterol. Patient is still full code after discussion. Will admit to hospitalist in stepdown unit. Hospitalist was consulted for admission and will see the patient in the emergency department.   Leo Grosser, MD 12/21/2014 [phone]

## 2014-12-22 NOTE — ED Notes (Signed)
Pt O2 level in range of 82-89.  Pt refuses to wear a non rebreather or any sort of mask to increase O2 level.  O2 [place] increased to 5L with no improvement in O2 sat-remaining 80-88 with occaisional drops to 78.  Pt states that she is not a DNR, she does want full treatment if she declines.

## 2014-12-22 NOTE — H&P (Signed)
[facility] History and Physical  [name] WNU:[id number] DOB: 16-Dec-1950 DOA: 12/18/2014  Referring physician: Leo Grosser, MD PCP: Warren Danes, MD   Chief Complaint: Shortness of breath.  HPI: [name] is a 64 y.o. female with a past medical history of COPD, cigarette smoker, history of multiple pneumonias, history of C. difficile, history of partial colectomy and ileostomy, paroxysmal atrial fibrillation hypertension, chronic myelocytic monoclonal leukemia, chronic anemia that requires Epogen injections and occasional blood transfusions, MRSA carrier, GERD who comes to the emergency department due to worsening shortness of breath, pleuritic chest pain, fatigue, cold sweats for several days. She has also been having occasionally productive cough of  mucus, decreased appetite, difficulty sleeping and dehydration due to high ileostomy output with decreased oral intake. She denies sick contacts or travel history.  She states that she hasn't been able to recover completely sitting hurts last episode of pneumonia. She also has a history of C. difficile colitis and the norvovirus colitis in the past. She is currently taking oral antibiotics at home. She denies abdominal pain and vomiting, but complains of frequent nausea.   In the ER she has received, bronchodilators, supplemental oxygen, IV fluids, analgesics, IV antibiotics and she states she feels better. She is currently in no acute distress but requires nasal cannula oxygen to maintain oxygenation.   Review of Systems:  Constitutional:  Positive chills, fatigue and cold sweats.  No weight loss, night sweats, Fevers, HEENT:  Occasional headaches, nasal congestion, post nasal drip,  Denies Difficulty swallowing,Tooth/dental problems,Sore throat,  No sneezing, itching, ear ache,  Cardio-vascular:  No chest pain, Orthopnea, PND, swelling in lower extremities, anasarca, dizziness, palpitations  GI: Chronic diarrhea, positive  loss of appetite  Occasional heartburn,   Denies nausea,indigestion, abdominal pain, vomiting,  change in bowel habits,  Resp:  Positive shortness of breath with exertion or at rest, positive wheezing and positive productive cough. Denies coughing up of blood.Marland KitchenNo chest wall deformity  Skin:  no rash or lesions.  GU:  no dysuria, change in color of urine, no urgency or frequency. No flank pain.  Musculoskeletal:  Occasional joint pain or swelling. No decreased range of motion. Frequent back pain.  Psych:  No change in mood or affect. No depression or anxiety. No memory loss.   Past Medical History  Diagnosis Date  . Hypertension     She has a past hx of essential  . 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 Inflammation of joint of knee     Since we last last saw her she developed problems with an acute which required surgical drainage by her orthopedist Dr. Durward Fortes.  Marland Kitchen MRSA (methicillin resistant Staphylococcus aureus)     Knee surgery drainage was positive for MRSA and she was treated with 3 weeks of doxycycline successfully.  . Diarrhea     Mild  . Exogenous obesity   . GERD (gastroesophageal reflux disease)     2 hosp.- ischemic colitis - residual of Norovirus, 05/2011- sm. bowel obstruction  . Headache(784.0)     migraine headache on occas, less now than when she was younger   . Arthritis     L hip, back, neck   . History of blood transfusion sept 2013    04/2011- /w ischemic colitis , trouble with matching blood  sept 2013  . Anemia     will see hematology consult prior to surgery,  recommended by Dr. Mare Ferrari  . Anemia 12/15/2010  . Ischemic colitis 01/31/2012  . Atrial flutter     during hospitalization, 04/2011- related to anemia & illness/stress   . Pneumonia     04/2011- not hospitalized , pt. denies SOB, changes in chest, breathing  . CMML (chronic myelomonocytic leukemia) 11/17/2011    Dr  Alvy Bimler follows this  . Dizziness     occasional  . B12 deficiency 02/27/2013  . 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 COPD (chronic obstructive pulmonary disease) 09/26/2013   Past Surgical History  Procedure Laterality Date  . Knee surgery      I&D- 2008, post laceration   . Colonoscopy  05/16/2011    Procedure: COLONOSCOPY;  Surgeon: Winfield Cunas., MD;  Location: [facility];  Service: Endoscopy;  Laterality: N/A;  . Small intestine surgery  1992, 1999  . Laparotomy and lysis of adhesions    . Total hip arthroplasty  10/25/2011    Procedure: TOTAL HIP ARTHROPLASTY;  Surgeon: Garald Balding, MD;  Location: [facility];  Service: Orthopedics;  Laterality: Left;  . Appendectomy  1962  . Abdominal hysterectomy  1988  . Partial colectomy and colostomy  sept 2013    mucous fistula done  . Partial colectomy  05/17/2012    Procedure: PARTIAL COLECTOMY;  Surgeon: Odis Hollingshead, MD;  Location: [facility];  Service: General;  Laterality: N/A;  . Colostomy closure  05/17/2012    Procedure: COLOSTOMY CLOSURE;  Surgeon: Odis Hollingshead, MD;  Location: [facility];  Service: General;  Laterality: N/A;  . Laparotomy  05/17/2012    Procedure: EXPLORATORY LAPAROTOMY;  Surgeon: Odis Hollingshead, MD;  Location: [facility];  Service: General;;  . Lysis of adhesion  05/17/2012    Procedure: LYSIS OF ADHESION;  Surgeon: Odis Hollingshead, MD;  Location: [facility];  Service: General;;  . Esophageal biopsy  05/17/2012    Procedure: BIOPSY;  Surgeon: Odis Hollingshead, MD;  Location: [facility];  Service: General;;  omental biopsy  . Laparotomy  05/22/2012    Procedure: EXPLORATORY LAPAROTOMY;  Surgeon: Odis Hollingshead, MD;  Location: [facility];  Service: General;  Laterality: N/A;  DRAINAGE  INTRA-ABDOMINAL ABSCESS/LYSIS OF ADHESIONSFOR SMALL BOWEL OBSTRUCTION/DIVERTING LOOP ILEOSTOMY  . Tee without cardioversion N/A 11/25/2013    Procedure: TRANSESOPHAGEAL ECHOCARDIOGRAM (TEE);  Surgeon: Dorothy Spark, MD;  Location: [facility];   Service: Cardiovascular;  Laterality: N/A;   Social History:  reports that she quit smoking about 21 months ago. Her smoking use included Cigarettes. She has a 5 pack-year smoking history. She has never used smokeless tobacco. She reports that she does not drink alcohol or use illicit drugs.  Allergies  Allergen Reactions  . Vancomycin Hives and Rash    wheezing  . Ativan [Lorazepam] Other (See Comments)    "makes me crazy"  . Codeine Nausea And Vomiting  . Tetanus Toxoids Other (See Comments)    Serum reaction  . Penicillins Hives and Rash  . Xarelto [Rivaroxaban] Hives and Rash    (May not be allergic. Vancomycin was also being taken when reaction occurred.)    Family History  Problem Relation Age of Onset  . Stroke Father   . Atrial fibrillation Father   . Hypertension Mother      Prior to Admission medications   Medication Sig Start Date End Date Taking? Authorizing Provider  albuterol (PROVENTIL HFA;VENTOLIN HFA) 108 (90 BASE) MCG/ACT inhaler Inhale 1-2 puffs into the lungs every 6 (six) hours  as needed for wheezing or shortness of breath. 08/08/14  Yes Deneise Lever, MD  albuterol (PROVENTIL) (2.5 MG/3ML) 0.083% nebulizer solution Take 3 mLs (2.5 mg total) by nebulization every 6 (six) hours as needed for wheezing or shortness of breath. 12/08/14  Yes Deneise Lever, MD  aspirin 325 MG EC tablet Take 325 mg by mouth every morning.    Yes Historical Provider, MD  bisoprolol (ZEBETA) 5 MG tablet TAKE 1 TABLET BY MOUTH ONCE DAILY BEFORE EATING YOUR BREAKFAST Patient taking differently: TAKE 5 MG BY MOUTH ONCE DAILY BEFORE EATING YOUR BREAKFAST 11/11/14  Yes Darlin Coco, MD  buPROPion (WELLBUTRIN XL) 300 MG 24 hr tablet Take 1 tablet (300 mg total) by mouth daily before breakfast. 05/27/14  Yes Darlin Coco, MD  cefUROXime (CEFTIN) 250 MG tablet Take 1 tablet (250 mg total) by mouth 2 (two) times daily with a meal. 12/08/14  Yes Deneise Lever, MD  celecoxib (CELEBREX) 200  MG capsule Take 200 mg by mouth daily.  12/25/13  Yes Historical Provider, MD  cyclobenzaprine (FLEXERIL) 10 MG tablet Take 10 mg by mouth 3 (three) times daily as needed for muscle spasms.  07/12/13  Yes Historical Provider, MD  diltiazem (CARDIZEM CD) 120 MG 24 hr capsule Take 1 capsule (120 mg total) by mouth daily. 05/27/14  Yes Darlin Coco, MD  diphenoxylate-atropine (LOMOTIL) 2.5-0.025 MG per tablet Take 1 tablet by mouth daily as needed (for diarrhea).  12/29/13  Yes Historical Provider, MD  esomeprazole (NEXIUM) 40 MG capsule Take 40 mg by mouth daily at 12 noon.   Yes Historical Provider, MD  estradiol (ESTRACE) 2 MG tablet Take 2 mg by mouth daily before breakfast.    Yes Historical Provider, MD  furosemide (LASIX) 40 MG tablet Take 40 mg by mouth daily as needed (for fluid).    Yes Historical Provider, MD  HYDROcodone-acetaminophen (NORCO) 10-325 MG per tablet Take 1 tablet by mouth every 4 (four) hours as needed (for pain).  11/15/13  Yes Historical Provider, MD  loratadine-pseudoephedrine (CLARITIN-D 12-HOUR) 5-120 MG per tablet Take 1 tablet by mouth daily as needed for allergies.    Yes Historical Provider, MD  Nebulizers (COMPRESSOR NEBULIZER) MISC 1 Device by Does not apply route 4 (four) times daily as needed. 12/08/14  Yes Deneise Lever, MD  ondansetron (ZOFRAN) 4 MG tablet Take 4 mg by mouth every 8 (eight) hours as needed for nausea or vomiting.   Yes Historical Provider, MD  Respiratory Therapy Supplies (FLUTTER) DEVI Blow through 4 times per set, three times daily 12/08/14  Yes Deneise Lever, MD  Azelastine-Fluticasone [facility]) 137-50 MCG/ACT SUSP Place 2 sprays into both nostrils at bedtime. Patient not taking: Reported on 01/11/2015 06/12/14   Deneise Lever, MD  potassium chloride 20 MEQ TBCR Take 20 mEq by mouth 3 (three) times daily. Patient not taking: Reported on 12/11/2014 12/22/13   Verlee Monte, MD   Physical Exam: Filed Vitals:   12/23/2014 1508 01/10/2015 1800 01/06/2015  1830 01/06/2015 1900  BP:  109/64 108/63 110/81  Pulse:  111 116 119  Temp:      TempSrc:      Resp:  16 15 14   SpO2: 89% 88% 85% 90%    Wt Readings from Last 3 Encounters:  12/11/14 57.743 kg (127 lb 4.8 oz)  12/08/14 57.97 kg (127 lb 12.8 oz)  10/01/14 61.236 kg (135 lb)    General:  Appears calm and comfortable Eyes: PERRL, normal lids, irises &  conjunctiva ENT: grossly normal hearing, lips & tongue are very dry. Neck: no LAD, masses or thyromegaly Cardiovascular: Tachycardic, No LE edema. Telemetry: Sinus tachycardia at 112 bpm Respiratory: Mild wheezing and rhonchi bilaterally. Scattered rales. Abdomen:Positive ileostomy, soft, nontender  Skin: no rash or induration seen on limited exam Musculoskeletal: grossly normal tone BUE/BLE Psychiatric: grossly normal mood and affect, speech fluent and appropriate Neurologic: grossly non-focal.          Labs on Admission:  Basic Metabolic Panel:  Recent Labs Lab 12/21/2014 1534  NA 132*  K 5.4*  CL 107  CO2 18*  GLUCOSE 97  BUN 38*  CREATININE 1.72*  CALCIUM 6.9*   Liver Function Tests:  Recent Labs Lab 12/20/2014 1534  AST 10*  ALT 9*  ALKPHOS 140*  BILITOT 0.6  PROT 5.0*  ALBUMIN 2.7*   No results for input(s): LIPASE, AMYLASE in the last 168 hours. No results for input(s): AMMONIA in the last 168 hours. CBC:  Recent Labs Lab 12/21/2014 1534  WBC 9.0  NEUTROABS 3.7  HGB 9.1*  HCT 28.2*  MCV 100.7*  PLT 209   Blood gas, arterial ([facility] ONLY) [[id number]] (Abnormal) Collected: 12/23/2014 1629    Updated: 12/28/2014 1641    Specimen Type: Blood    Specimen Source: Artery     O2 Content 3.0 L/min    Delivery systems NASAL CANNULA    pH, Arterial 7.243 (L)    pCO2 arterial 36.1 mmHg    pO2, Arterial 69.0 (L) mmHg    Bicarbonate 15.0 (L) mEq/L    TCO2 14.6 mmol/L    Acid-base deficit 11.0 (H) mmol/L    O2 Saturation 88.0 %    Patient temperature 98.6    Collection site LEFT BRACHIAL     Drawn by [id number]    Sample type ARTERIAL DRAW   I-stat troponin, ED [[id number]] Collected: 01/11/2015 1606   Updated: 12/27/2014 1617     Troponin i, poc 0.00 ng/mL    Comment 3      I-Stat CG4 Lactic Acid, ED [[id number]] Collected: 12/27/2014 1611   Updated: 01/16/2015 1614    Specimen Type: Blood     Lactic Acid, Venous 1.10 mmol/L    Radiological Exams on Admission: Dg Chest 2 View  12/25/2014   CLINICAL DATA:  Chest pain and short of breath.  EXAM: CHEST  2 VIEW  COMPARISON:  Radiograph 10/01/2014  FINDINGS: Stable cardiac silhouette. There is interval increase in bilateral patchy airspace disease with lower lobe predominance. Small pleural effusions. No pneumothorax.  IMPRESSION: Bilateral patchy dense severe airspace disease concerning for multifocal pneumonia versus flash pulmonary edema.   Electronically Signed   By: Suzy Bouchard M.D.   On: 01/13/2015 16:54   Ct Chest Wo Contrast  01/08/2015   CLINICAL DATA:  Pneumonia versus pulmonary edema on chest radiograph.  EXAM: CT CHEST WITHOUT CONTRAST  TECHNIQUE: Multidetector CT imaging of the chest was performed following the standard protocol without IV contrast.  COMPARISON:  Chest radiograph 12/21/2014  FINDINGS: Mediastinum/Nodes: No axillary or supraclavicular adenopathy. There are multiple mediastinal lymph nodes present. Largest in the subcarinal location measuring 2.2 cm. RIGHT lower paratracheal lymph node measures 1.6 cm. The bilateral hilar lymph nodes. No pericardial fluid. Coronary calcifications noted. Esophagus is normal.  Lungs/Pleura: There is dense lower lobe airspace disease which reaches confluence. This also involvement of the RIGHT middle lobe and lingula. Airspace disease extends to the upper lobes additionally. Discrete nodule in the RIGHT upper lobe measures 10  mm. Nodule in lobe LEFT upper lobe measures 14 mm.  Upper abdomen: Spleen appears enlarged. Adrenal glands are normal. No upper abdominal adenopathy  identified.  Musculoskeletal: Degenerate change in the spine. No aggressive osseous lesion.  IMPRESSION: 1. Dense diffuse bilateral airspace disease or reaching confluence/ consolidation and involving all the lobes of LEFT and RIGHT lung. Several discrete nodules are present in the upper lobes. 2. Bulky mediastinal adenopathy. 3. This combination of dense nodular consolidation in the lungs and bulky mediastinal adenopathy raises the concern for malignancy such as lymphoma.   Electronically Signed   By: Suzy Bouchard M.D.   On: 01/01/2015 18:36    Transesophageal Echocardiogram: 11/25/2013  ------------------------------------------------------------------- Indications:   Endocarditis 421.9.  ------------------------------------------------------------------- Study Conclusions  - Left ventricle: The cavity size was normal. Wall thickness was normal. Systolic function was normal. - Aortic valve: No evidence of vegetation. There was mild regurgitation. - Aorta: Mildly dilated ascending aorta measuring 41 mm. - Mitral valve: No evidence of vegetation. - Left atrium: No evidence of thrombus in the atrial cavity or appendage. No evidence of thrombus in the appendage. - Right atrium: No evidence of thrombus in the atrial cavity or appendage. - Tricuspid valve: No evidence of vegetation. - Pulmonic valve: No evidence of vegetation.  Impressions:  - No evidence of endocarditis. No cardiac source of emboli was indentified.  Transthoracic echocardiogram: 11/19/2013   History:  PMH:  Atrial fibrillation. Chronic obstructive pulmonary disease. Risk factors: Former tobacco use. Hypercholesterolemia.  ------------------------------------------------------------------- Study Conclusions  - Left ventricle: The cavity size was normal. Systolic function was normal. The estimated ejection fraction was in the range of 60% to 65%. Wall motion was normal; there were no  regional wall motion abnormalities. Doppler parameters are consistent with abnormal left ventricular relaxation (grade 1 diastolic dysfunction). Doppler parameters are consistent with elevated ventricular end-diastolic filling pressure. - Ascending aorta: The ascending aorta was mildly dilated measuring 41 mm. - Mitral valve: Calcified annulus. - Left atrium: The atrium was normal in size. - Right atrium: The atrium was normal in size. - Tricuspid valve: There was no regurgitation. - Pulmonary arteries: Systolic pressure was within the normal range.  Impressions:  - The ascending aorta was mildly dilated measuring 41 mm, unchanged from the study on 12/21/2012.    EKG: Independently reviewed. Vent. rate 114 BPM PR interval 180 ms QRS duration 91 ms QT/QTc 326/449 ms P-R-T axes 67 -33 86 Sinus tachycardia Left axis deviation Low voltage, precordial leads Consider anterior infarct Minimal ST depression, lateral leads Since last tracing rhythym now sinus with similar rate  Assessment/Plan Principal Problem:   HCAP (healthcare-associated pneumonia) Admit to a stepdown for closer monitoring. Continue supplemental oxygen. BiPAP ventilation when necessary. Continue nebulized beta agonist and ipratropium. I will add Pulmicort via nebulizer twice a day. Follow-up blood gases as needed.  Active Problems:   Diarrhea Check stool C. difficile toxin. Anesthetic precautions.    Dehydration/ AKI Continue IV hydration. Monitor BUN and creatinine.      Hyperkalemia Continue IV hydration and follow-up level of potassium in the morning.    Chronic back pain Continue hydrocodone as needed.    Atrial fibrillation Patient is in sinus tachycardia at this time. I will continue slow release still to aspirin and beta blocker.    CMML (chronic myelomonocytic leukemia)     Anemia of chronic disease Monitor CBC.  Continue Epogen injections as needed. Transfuse if  necessary.Marland Kitchen    GERD (gastroesophageal reflux disease) Continue Nexium 40 mg twice  a day.    Code Status: Full code. DVT Prophylaxis: Lovenox SQ. Family Communication:  Sister and friend at bedside. However the patient was able to provide history herself.  Lenna Sciara Sister [phone]  [phone]   Hums,Craig Son [phone]     Carlena Sax [phone]  [phone]  Disposition Plan: Admit to stepdown for close monitoring, IV antibiotics for several days, follow-up cultures and sensitivities.  Time spent: Over 70 minutes were spent during this admission.  Reubin Milan [facility] Pager [phone]

## 2014-12-22 NOTE — ED Notes (Signed)
Hospitalist at bedside 

## 2014-12-23 ENCOUNTER — Inpatient Hospital Stay (HOSPITAL_COMMUNITY): Payer: 59

## 2014-12-23 DIAGNOSIS — J9601 Acute respiratory failure with hypoxia: Secondary | ICD-10-CM | POA: Insufficient documentation

## 2014-12-23 LAB — COMPREHENSIVE METABOLIC PANEL
ALK PHOS: 137 U/L — AB (ref 38–126)
ALT: 9 U/L — AB (ref 14–54)
AST: 10 U/L — AB (ref 15–41)
Albumin: 2.6 g/dL — ABNORMAL LOW (ref 3.5–5.0)
Anion gap: 7 (ref 5–15)
BUN: 37 mg/dL — AB (ref 6–20)
CALCIUM: 6.9 mg/dL — AB (ref 8.9–10.3)
CHLORIDE: 109 mmol/L (ref 101–111)
CO2: 17 mmol/L — AB (ref 22–32)
CREATININE: 1.55 mg/dL — AB (ref 0.44–1.00)
GFR, EST AFRICAN AMERICAN: 39 mL/min — AB (ref >60)
GFR, EST NON AFRICAN AMERICAN: 34 mL/min — AB (ref >60)
Glucose, Bld: 110 mg/dL — ABNORMAL HIGH (ref 65–99)
Potassium: 5.2 mmol/L — ABNORMAL HIGH (ref 3.5–5.1)
Sodium: 133 mmol/L — ABNORMAL LOW (ref 135–145)
Total Bilirubin: 0.3 mg/dL (ref 0.3–1.2)
Total Protein: 4.9 g/dL — ABNORMAL LOW (ref 6.5–8.1)

## 2014-12-23 LAB — BASIC METABOLIC PANEL
ANION GAP: 7 (ref 5–15)
BUN: 37 mg/dL — AB (ref 6–20)
CHLORIDE: 98 mmol/L — AB (ref 101–111)
CO2: 24 mmol/L (ref 22–32)
Calcium: 6.1 mg/dL — CL (ref 8.9–10.3)
Creatinine, Ser: 1.59 mg/dL — ABNORMAL HIGH (ref 0.44–1.00)
GFR calc non Af Amer: 33 mL/min — ABNORMAL LOW (ref >60)
GFR, EST AFRICAN AMERICAN: 38 mL/min — AB (ref >60)
Glucose, Bld: 294 mg/dL — ABNORMAL HIGH (ref 65–99)
POTASSIUM: 4.6 mmol/L (ref 3.5–5.1)
SODIUM: 129 mmol/L — AB (ref 135–145)

## 2014-12-23 LAB — BLOOD GAS, ARTERIAL
Acid-base deficit: 13.1 mmol/L — ABNORMAL HIGH (ref 0.0–2.0)
BICARBONATE: 17.1 meq/L — AB (ref 20.0–24.0)
Drawn by: 422461
FIO2: 1
O2 Saturation: 97.9 %
PATIENT TEMPERATURE: 98.6
PCO2 ART: 66.6 mmHg — AB (ref 35.0–45.0)
PEEP: 5 cmH2O
RATE: 16 resp/min
TCO2: 17.7 mmol/L (ref 0–100)
VT: 460 mL
pH, Arterial: 7.037 — CL (ref 7.350–7.450)
pO2, Arterial: 166 mmHg — ABNORMAL HIGH (ref 80.0–100.0)

## 2014-12-23 LAB — CBC
HCT: 23.6 % — ABNORMAL LOW (ref 36.0–46.0)
HEMOGLOBIN: 7.6 g/dL — AB (ref 12.0–15.0)
MCH: 32.5 pg (ref 26.0–34.0)
MCHC: 32.2 g/dL (ref 30.0–36.0)
MCV: 100.9 fL — ABNORMAL HIGH (ref 78.0–100.0)
Platelets: 225 10*3/uL (ref 150–400)
RBC: 2.34 MIL/uL — AB (ref 3.87–5.11)
RDW: 19.2 % — ABNORMAL HIGH (ref 11.5–15.5)
WBC: 15.3 10*3/uL — AB (ref 4.0–10.5)

## 2014-12-23 LAB — CBC WITH DIFFERENTIAL/PLATELET
BASOS ABS: 0.1 10*3/uL (ref 0.0–0.1)
BASOS PCT: 1 % (ref 0–1)
EOS PCT: 1 % (ref 0–5)
Eosinophils Absolute: 0.1 10*3/uL (ref 0.0–0.7)
HEMATOCRIT: 28.9 % — AB (ref 36.0–46.0)
HEMOGLOBIN: 9 g/dL — AB (ref 12.0–15.0)
LYMPHS ABS: 2.4 10*3/uL (ref 0.7–4.0)
LYMPHS PCT: 22 % (ref 12–46)
MCH: 31.9 pg (ref 26.0–34.0)
MCHC: 31.1 g/dL (ref 30.0–36.0)
MCV: 102.5 fL — AB (ref 78.0–100.0)
MONOS PCT: 29 % — AB (ref 3–12)
Monocytes Absolute: 3.2 10*3/uL — ABNORMAL HIGH (ref 0.1–1.0)
NEUTROS ABS: 5.2 10*3/uL (ref 1.7–7.7)
Neutrophils Relative %: 47 % (ref 43–77)
PLATELETS: 193 10*3/uL (ref 150–400)
RBC: 2.82 MIL/uL — ABNORMAL LOW (ref 3.87–5.11)
RDW: 19.2 % — ABNORMAL HIGH (ref 11.5–15.5)
WBC: 11 10*3/uL — ABNORMAL HIGH (ref 4.0–10.5)

## 2014-12-23 LAB — STREP PNEUMONIAE URINARY ANTIGEN: Strep Pneumo Urinary Antigen: NEGATIVE

## 2014-12-23 LAB — LACTIC ACID, PLASMA: LACTIC ACID, VENOUS: 1.1 mmol/L (ref 0.5–2.0)

## 2014-12-23 LAB — MAGNESIUM: MAGNESIUM: 2 mg/dL (ref 1.7–2.4)

## 2014-12-23 LAB — TROPONIN I

## 2014-12-23 LAB — MRSA PCR SCREENING: MRSA by PCR: POSITIVE — AB

## 2014-12-23 MED ORDER — VITAMINS A & D EX OINT
TOPICAL_OINTMENT | CUTANEOUS | Status: AC
  Filled 2014-12-23: qty 5

## 2014-12-23 MED ORDER — MIDAZOLAM HCL 2 MG/2ML IJ SOLN
1.0000 mg | INTRAMUSCULAR | Status: AC | PRN
Start: 2014-12-23 — End: 2014-12-26
  Administered 2014-12-23 – 2014-12-24 (×2): 2 mg via INTRAVENOUS
  Administered 2014-12-26: 1 mg via INTRAVENOUS
  Filled 2014-12-23: qty 2

## 2014-12-23 MED ORDER — NOREPINEPHRINE BITARTRATE 1 MG/ML IV SOLN
0.0000 ug/min | INTRAVENOUS | Status: DC
  Administered 2014-12-23: 30 ug/min via INTRAVENOUS
  Administered 2014-12-23: 20 ug/min via INTRAVENOUS
  Administered 2014-12-24: 10 ug/min via INTRAVENOUS
  Administered 2014-12-24: 15 ug/min via INTRAVENOUS
  Administered 2014-12-24: 14 ug/min via INTRAVENOUS
  Administered 2014-12-24 (×2): 15 ug/min via INTRAVENOUS
  Filled 2014-12-23 (×8): qty 4

## 2014-12-23 MED ORDER — PROMETHAZINE HCL 25 MG/ML IJ SOLN
12.5000 mg | Freq: Once | INTRAMUSCULAR | Status: AC
  Administered 2014-12-23: 12.5 mg via INTRAVENOUS
  Filled 2014-12-23: qty 1

## 2014-12-23 MED ORDER — SODIUM CHLORIDE 0.9 % IV BOLUS (SEPSIS)
500.0000 mL | Freq: Once | INTRAVENOUS | Status: AC
  Administered 2014-12-23: 500 mL via INTRAVENOUS

## 2014-12-23 MED ORDER — MIDAZOLAM HCL 2 MG/2ML IJ SOLN
INTRAMUSCULAR | Status: AC
Start: 2014-12-23 — End: 2014-12-23
  Administered 2014-12-23: 2 mg via INTRAVENOUS
  Filled 2014-12-23: qty 4

## 2014-12-23 MED ORDER — DEXTROSE 5 % IV SOLN
2.0000 g | Freq: Two times a day (BID) | INTRAVENOUS | Status: DC
  Administered 2014-12-23 – 2014-12-31 (×17): 2 g via INTRAVENOUS
  Filled 2014-12-23 (×17): qty 2

## 2014-12-23 MED ORDER — CHLORHEXIDINE GLUCONATE 0.12% ORAL RINSE (MEDLINE KIT)
15.0000 mL | Freq: Two times a day (BID) | OROMUCOSAL | Status: DC
  Administered 2014-12-24 – 2015-01-12 (×38): 15 mL via OROMUCOSAL

## 2014-12-23 MED ORDER — SODIUM CHLORIDE 0.9 % IV SOLN
1.0000 g | Freq: Once | INTRAVENOUS | Status: AC
  Administered 2014-12-23: 1 g via INTRAVENOUS
  Filled 2014-12-23: qty 10

## 2014-12-23 MED ORDER — ANTISEPTIC ORAL RINSE SOLUTION (CORINZ)
7.0000 mL | Freq: Four times a day (QID) | OROMUCOSAL | Status: DC
  Administered 2014-12-23 – 2015-01-12 (×75): 7 mL via OROMUCOSAL

## 2014-12-23 MED ORDER — ALBUTEROL SULFATE (2.5 MG/3ML) 0.083% IN NEBU: 2.5000 mg | INHALATION_SOLUTION | RESPIRATORY_TRACT | Status: DC | PRN

## 2014-12-23 MED ORDER — ENSURE ENLIVE PO LIQD: 237.0000 mL | Freq: Two times a day (BID) | ORAL | Status: DC

## 2014-12-23 MED ORDER — SODIUM CHLORIDE 0.9 % IV SOLN
25.0000 ug/h | INTRAVENOUS | Status: DC
  Administered 2014-12-23: 50 ug/h via INTRAVENOUS
  Administered 2014-12-25: 350 ug/h via INTRAVENOUS
  Administered 2014-12-26: 125 ug/h via INTRAVENOUS
  Administered 2014-12-26 – 2014-12-27 (×2): 25 ug/h via INTRAVENOUS
  Administered 2014-12-27: 50 ug/h via INTRAVENOUS
  Administered 2014-12-27: 25 ug/h via INTRAVENOUS
  Filled 2014-12-23 (×5): qty 50

## 2014-12-23 MED ORDER — STERILE WATER FOR INJECTION IV SOLN
INTRAVENOUS | Status: DC
  Administered 2014-12-23 – 2014-12-26 (×6): via INTRAVENOUS
  Filled 2014-12-23 (×16): qty 850

## 2014-12-23 MED ORDER — FENTANYL BOLUS VIA INFUSION
25.0000 ug | INTRAVENOUS | Status: DC | PRN
  Filled 2014-12-23: qty 25

## 2014-12-23 MED ORDER — LIDOCAINE HCL 1 % IJ SOLN
INTRAMUSCULAR | Status: AC
  Filled 2014-12-23: qty 20

## 2014-12-23 MED ORDER — FUROSEMIDE 10 MG/ML IJ SOLN
20.0000 mg | Freq: Once | INTRAMUSCULAR | Status: AC
  Administered 2014-12-23: 20 mg via INTRAVENOUS
  Filled 2014-12-23: qty 2

## 2014-12-23 MED ORDER — MIDAZOLAM HCL 2 MG/2ML IJ SOLN
1.0000 mg | INTRAMUSCULAR | Status: DC | PRN
  Administered 2014-12-23 – 2014-12-27 (×7): 1 mg via INTRAVENOUS
  Filled 2014-12-23 (×7): qty 2

## 2014-12-23 MED ORDER — SODIUM CHLORIDE 0.9 % IV BOLUS (SEPSIS)
1000.0000 mL | Freq: Once | INTRAVENOUS | Status: AC
  Administered 2014-12-23: 1000 mL via INTRAVENOUS

## 2014-12-23 MED ORDER — CHLORHEXIDINE GLUCONATE CLOTH 2 % EX PADS
6.0000 | MEDICATED_PAD | Freq: Every day | CUTANEOUS | Status: AC
  Administered 2014-12-23 – 2014-12-27 (×5): 6 via TOPICAL

## 2014-12-23 MED ORDER — MORPHINE SULFATE (PF) 2 MG/ML IV SOLN
1.0000 mg | Freq: Once | INTRAVENOUS | Status: AC
  Administered 2014-12-23: 1 mg via INTRAVENOUS
  Filled 2014-12-23: qty 1

## 2014-12-23 MED ORDER — FENTANYL CITRATE (PF) 100 MCG/2ML IJ SOLN
50.0000 ug | Freq: Once | INTRAMUSCULAR | Status: AC
  Administered 2014-12-23: 50 ug via INTRAVENOUS

## 2014-12-23 MED ORDER — MUPIROCIN 2 % EX OINT
1.0000 "application " | TOPICAL_OINTMENT | Freq: Two times a day (BID) | CUTANEOUS | Status: AC
  Administered 2014-12-23 – 2014-12-27 (×10): 1 via NASAL
  Filled 2014-12-23: qty 22

## 2014-12-23 MED ORDER — PANTOPRAZOLE SODIUM 40 MG IV SOLR
40.0000 mg | INTRAVENOUS | Status: DC
  Administered 2014-12-23: 40 mg via INTRAVENOUS
  Filled 2014-12-23: qty 40

## 2014-12-23 MED ORDER — ENOXAPARIN SODIUM 40 MG/0.4ML ~~LOC~~ SOLN
40.0000 mg | SUBCUTANEOUS | Status: DC
  Administered 2014-12-23 – 2014-12-24 (×2): 40 mg via SUBCUTANEOUS
  Filled 2014-12-23 (×2): qty 0.4

## 2014-12-23 MED ORDER — FENTANYL CITRATE (PF) 100 MCG/2ML IJ SOLN
INTRAMUSCULAR | Status: AC
  Administered 2014-12-23: 50 ug
  Filled 2014-12-23: qty 4

## 2014-12-23 MED ORDER — SODIUM BICARBONATE 8.4 % IV SOLN
INTRAVENOUS | Status: AC
  Filled 2014-12-23: qty 50

## 2014-12-23 NOTE — Progress Notes (Signed)
md notified of pt going in and out of afib and of low bp. New orders received and noted.

## 2014-12-23 NOTE — Consult Note (Signed)
PULMONARY / CRITICAL CARE MEDICINE   Name: [name] MRN: [id number] DOB: 1950/11/01    ADMISSION DATE:  01/16/2015 CONSULTATION DATE:  9/06  REFERRING MD :  Dr. Karleen Hampshire   CHIEF COMPLAINT:  Shortness of breath   INITIAL PRESENTATION: [name], current occasional smoker (1-2 cigarettes per day) who was admitted on 9/5 with worsening shortness of breath.    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 Alvy Bimler, not on medications), chronic anemia / B12 deficiency on Epogen, suspected COPD (not defined with PFT's), and recurrent bouts of pneumonia who presented to [facility] on 9/5 with with worsening shortness of breath.    On presentation, the patient reported approximately 2 weeks of worsening shortness of breath and 4 days of chest pain.  Prior to admit, she was not on oxygen at home. She required no assistive devices for ADL's / walker etc.  She has limited activity tolerance but remained able to go out for lunch / meals without assist.  She is followed by Dr. Annamaria Boots for Pulmonary and has been worked for aspiration by GI given recurrent infiltrates which was negative.  However, there was concern for nocturnal aspiration.  In the past, she has had dyspnea associated with periods of worsening anemia.    Initial evaluation was notable for increased work of breathing and hypoxemia with saturations into the 70's.  She refused bipap on admit.  Labs Na 132, K 5.4, CO2 18, BUN 38, sr cr 1.72, alk phos 140, albumin 2.7, AST 10, ALT 9, WBC  11.0, Hgb 9, MCV 102.5 and platelets 193.  The patient was admitted to SDU per [facility]. She was further evaluated with a CT of the chest without contrast which showed diffuse bilateral airspace disease, consolidation involving all lobes of L/R lung, several discrete nodules present in upper lobes, & bulky mediastinal adenopathy.  PCCM consulted for evaluation.    Last hospitalization was 11/2013 for dehydration, PAF & C-Diff.  Dr. Annamaria Boots assessed immunoglobulins in 03/2014 and were within normal limits.  She notes her sputum production is usually white and had not changed with this episode (quantity or color).  She denies fevers / chills, n/v.      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  has past surgical history that includes Knee surgery; Colonoscopy (05/16/2011); Small intestine surgery (1992, 1999); Laparotomy and lysis of adhesions; Total hip arthroplasty (10/25/2011); Appendectomy (1962); Abdominal hysterectomy (1988); partial colectomy and colostomy (sept 2013); Partial colectomy (05/17/2012); Colostomy closure (05/17/2012); laparotomy (05/17/2012); Lysis of adhesion (05/17/2012); esophageal biopsy (05/17/2012); laparotomy (05/22/2012); and TEE without cardioversion (N/A, 11/25/2013).   Prior to Admission medications   Medication Sig Start Date End Date Taking? Authorizing Provider  albuterol (PROVENTIL HFA;VENTOLIN HFA) 108 (90 BASE) MCG/ACT inhaler Inhale 1-2  puffs into the lungs every 6 (six) hours as needed for wheezing or shortness of breath. 08/08/14  Yes Deneise Lever, MD  albuterol (PROVENTIL) (2.5 MG/3ML) 0.083% nebulizer solution Take 3 mLs (2.5 mg total) by nebulization every 6 (six) hours as needed for wheezing or shortness of breath. 12/08/14  Yes Deneise Lever, MD  aspirin 325 MG EC tablet Take 325 mg by mouth every morning.    Yes Historical Provider, MD  bisoprolol (ZEBETA) 5 MG tablet TAKE 1 TABLET BY MOUTH ONCE DAILY BEFORE EATING YOUR BREAKFAST Patient taking differently: TAKE 5 MG BY MOUTH ONCE DAILY BEFORE EATING YOUR BREAKFAST 11/11/14  Yes Darlin Coco, MD  buPROPion (WELLBUTRIN XL) 300 MG 24 hr tablet Take 1 tablet (300 mg total) by mouth daily before breakfast. 05/27/14  Yes Darlin Coco, MD  cefUROXime (CEFTIN) 250 MG tablet Take 1 tablet (250 mg total) by mouth 2 (two) times daily with a meal. 12/08/14  Yes Deneise Lever, MD  celecoxib (CELEBREX) 200 MG capsule Take 200 mg by mouth daily.  12/25/13  Yes Historical Provider, MD  cyclobenzaprine (FLEXERIL) 10 MG tablet Take 10 mg by mouth 3 (three) times daily as needed for muscle spasms.  07/12/13  Yes Historical Provider, MD  diltiazem (CARDIZEM CD) 120 MG 24 hr capsule Take 1 capsule (120 mg total) by mouth daily. 05/27/14  Yes Darlin Coco, MD  diphenoxylate-atropine (LOMOTIL) 2.5-0.025 MG per tablet Take 1 tablet by mouth daily as needed (for diarrhea).  12/29/13  Yes Historical Provider, MD  esomeprazole (NEXIUM) 40 MG capsule Take 40 mg by mouth daily at 12 noon.   Yes Historical Provider, MD  estradiol (ESTRACE) 2 MG tablet Take 2 mg by mouth daily before breakfast.    Yes Historical Provider, MD  furosemide (LASIX) 40 MG tablet Take 40 mg by mouth daily as needed (for fluid).    Yes Historical Provider, MD  HYDROcodone-acetaminophen (NORCO) 10-325 MG per tablet Take 1 tablet by mouth every 4 (four) hours as needed (for pain).  11/15/13  Yes Historical Provider, MD   loratadine-pseudoephedrine (CLARITIN-D 12-HOUR) 5-120 MG per tablet Take 1 tablet by mouth daily as needed for allergies.    Yes Historical Provider, MD  Nebulizers (COMPRESSOR NEBULIZER) MISC 1 Device by Does not apply route 4 (four) times daily as needed. 12/08/14  Yes Deneise Lever, MD  ondansetron (ZOFRAN) 4 MG tablet Take 4 mg by mouth every 8 (eight) hours as needed for nausea or vomiting.   Yes Historical Provider, MD  Respiratory Therapy Supplies (FLUTTER) DEVI Blow through 4 times per set, three times daily 12/08/14  Yes Deneise Lever, MD  Azelastine-Fluticasone [facility]) 137-50 MCG/ACT SUSP Place 2 sprays into both nostrils at bedtime. Patient not taking: Reported on 01/05/2015 06/12/14   Deneise Lever, MD  potassium chloride 20 MEQ TBCR Take 20 mEq by mouth 3 (three) times daily. Patient not taking: Reported on 12/11/2014 12/22/13   Verlee Monte, MD   Allergies  Allergen Reactions  . Vancomycin Hives and Rash    wheezing  . Ativan [Lorazepam] Other (See Comments)    "makes me crazy"  . Codeine Nausea And Vomiting  . Tetanus Toxoids Other (See Comments)    Serum reaction  . Penicillins Hives and Rash  . Xarelto [Rivaroxaban] Hives and Rash    (May not be allergic. Vancomycin was also being taken when reaction occurred.)    FAMILY  HISTORY:  indicated that her mother is deceased. She indicated that her father is deceased. She indicated that her sister is alive.    SOCIAL HISTORY:   reports that she quit smoking about 21 months ago. Her smoking use included Cigarettes. She has a 5 pack-year smoking history. She has never used smokeless tobacco. She reports that she does not drink alcohol or use illicit drugs.  REVIEW OF SYSTEMS:   Gen: Denies fever, chills, weight change, fatigue, night sweats HEENT: Denies blurred vision, double vision, hearing loss, tinnitus, sinus congestion, rhinorrhea, sore throat, neck stiffness, dysphagia PULM: Denies cough, hemoptysis, wheezing.   Reports shortness of breath, cough with white sputum production unchanged from baseline CV: Denies chest pain, edema, orthopnea, paroxysmal nocturnal dyspnea, palpitations GI: Denies abdominal pain, nausea, vomiting, diarrhea, hematochezia, melena, constipation, change in bowel habits GU: Denies dysuria, hematuria, polyuria, oliguria, urethral discharge Endocrine: Denies hot or cold intolerance, polyuria, polyphagia or appetite change Derm: Denies rash, dry skin, scaling or peeling skin change Heme: Denies easy bruising, bleeding, bleeding gums Neuro: Denies headache, numbness, weakness, slurred speech, loss of memory or consciousness   SUBJECTIVE:   VITAL SIGNS: Temp:  [97.7 F (36.5 C)-98.8 F (37.1 C)] 98.6 F (37 C) (09/06 0804) Pulse Rate:  [35-119] 108 (09/06 0913) Resp:  [10-20] 15 (09/06 0913) BP: (96-128)/(52-115) 117/57 mmHg (09/06 0830) SpO2:  [67 %-100 %] 87 % (09/06 0913) FiO2 (%):  [100 %] 100 % (09/06 0804) Weight:  [132 lb 7.9 oz (60.1 kg)] 132 lb 7.9 oz (60.1 kg) (09/05 2336)   HEMODYNAMICS:     VENTILATOR SETTINGS: Vent Mode:  [-]  FiO2 (%):  [100 %] 100 %   INTAKE / OUTPUT:  Intake/Output Summary (Last 24 hours) at 12/23/14 1140 Last data filed at 12/23/14 0900  Gross per 24 hour  Intake   2360 ml  Output    875 ml  Net   1485 ml    PHYSICAL EXAMINATION: General:  Thin, frail adult in NAD Neuro:  AAOx4, speech clear, MAE HEENT:  MM pink/dry, no jvd  Cardiovascular:  s1s2 rrr, gallop 2/6 SEM  Lungs:  resp's even/non-labored on [place] O2, crackles in LLL, coarse anterior Abdomen:  Ileostomy, mid-line old abd wound dressing c/d/i Musculoskeletal:  No acute deformities  Skin:  Warm/dry, no edema   LABS:  CBC  Recent Labs Lab 12/29/2014 1534 12/23/14 0405  WBC 9.0 11.0*  HGB 9.1* 9.0*  HCT 28.2* 28.9*  PLT 209 193   Coag's No results for input(s): APTT, INR in the last 168 hours.   BMET  Recent Labs Lab 12/20/2014 1534 12/23/14 0405  NA  132* 133*  K 5.4* 5.2*  CL 107 109  CO2 18* 17*  BUN 38* 37*  CREATININE 1.72* 1.55*  GLUCOSE 97 110*   Electrolytes  Recent Labs Lab 12/23/2014 1534 01/11/2015 2006 12/23/14 0405 12/23/14 0408  CALCIUM 6.9*  --  6.9*  --   MG  --  0.9*  --  2.0  PHOS  --  5.4*  --   --    Sepsis Markers  Recent Labs Lab 01/11/2015 1611  LATICACIDVEN 1.10   ABG  Recent Labs Lab 01/11/2015 1629  PHART 7.243*  PCO2ART 36.1  PO2ART 69.0*   Liver Enzymes  Recent Labs Lab 01/14/2015 1534 12/23/14 0405  AST 10* 10*  ALT 9* 9*  ALKPHOS 140* 137*  BILITOT 0.6 0.3  ALBUMIN 2.7* 2.6*   Cardiac Enzymes No results for input(s): TROPONINI, PROBNP in  the last 168 hours.   Glucose No results for input(s): GLUCAP in the last 168 hours.  Imaging Dg Chest 2 View  12/28/2014   CLINICAL DATA:  Chest pain and short of breath.  EXAM: CHEST  2 VIEW  COMPARISON:  Radiograph 10/01/2014  FINDINGS: Stable cardiac silhouette. There is interval increase in bilateral patchy airspace disease with lower lobe predominance. Small pleural effusions. No pneumothorax.  IMPRESSION: Bilateral patchy dense severe airspace disease concerning for multifocal pneumonia versus flash pulmonary edema.   Electronically Signed   By: Suzy Bouchard M.D.   On: 01/11/2015 16:54   Ct Chest Wo Contrast  12/20/2014   CLINICAL DATA:  Pneumonia versus pulmonary edema on chest radiograph.  EXAM: CT CHEST WITHOUT CONTRAST  TECHNIQUE: Multidetector CT imaging of the chest was performed following the standard protocol without IV contrast.  COMPARISON:  Chest radiograph 01/11/2015  FINDINGS: Mediastinum/Nodes: No axillary or supraclavicular adenopathy. There are multiple mediastinal lymph nodes present. Largest in the subcarinal location measuring 2.2 cm. RIGHT lower paratracheal lymph node measures 1.6 cm. The bilateral hilar lymph nodes. No pericardial fluid. Coronary calcifications noted. Esophagus is normal.  Lungs/Pleura: There is dense  lower lobe airspace disease which reaches confluence. This also involvement of the RIGHT middle lobe and lingula. Airspace disease extends to the upper lobes additionally. Discrete nodule in the RIGHT upper lobe measures 10 mm. Nodule in lobe LEFT upper lobe measures 14 mm.  Upper abdomen: Spleen appears enlarged. Adrenal glands are normal. No upper abdominal adenopathy identified.  Musculoskeletal: Degenerate change in the spine. No aggressive osseous lesion.  IMPRESSION: 1. Dense diffuse bilateral airspace disease or reaching confluence/ consolidation and involving all the lobes of LEFT and RIGHT lung. Several discrete nodules are present in the upper lobes. 2. Bulky mediastinal adenopathy. 3. This combination of dense nodular consolidation in the lungs and bulky mediastinal adenopathy raises the concern for malignancy such as lymphoma.   Electronically Signed   By: Suzy Bouchard M.D.   On: 01/14/2015 18:36     ASSESSMENT / PLAN:  PULMONARY A: Dyspnea  Bilateral Infiltrates - ? Of chronic silent / nocturnal aspiration, has had negative GI work up in the past Nodular Opacities  Mediastinal Adenopathy  COPD - not defined with PFT's  Tobacco Abuse  P:   Continue broad spectrum abx, treat as HCAP Pulmonary hygiene:  IS, mobilize as able  Budesonide BID Duoneb Q6 Oxygen as needed to support saturations 90-95% Smoking cessation  Consider FOB if patient does not improve on empiric HCAP Rx given nodular appearance of opacities Aspiration precautions   CARDIOVASCULAR A:  PAF Hypertension  P:  Per Primary MD  RENAL A:   Acute Kidney Injury - in setting of volume depletion / diarrhea  Gap/Non-Gap Acidosis - in setting of chronic GI loss + AKI Hyponatremia  Hyperkalemia  Hypomagnesemia   P:   Trend BMP / UOP  Replace electrolytes as indicated  Begin Bicarb gtt for NAG acidosis, thought related to GI loss.  Pt puts out approximately 1.5L from ileostomy daily   GASTROINTESTINAL A:    Diarrhea  GERD P:   See ID  HEMATOLOGIC A:   CMML Chronic Anemia  P:  Per Primary  INFECTIOUS A:   Diarrhea - chronic, 1.5L ileostomy output per day on average Bilateral Pulmonary Infiltrates P:   BCx2 9/5 >>  C-Diff 9/6 >> colonization  Zyvox, start date 9/5, day 2/x Fortaz, start date 9/5, day 2/x    Noe Gens, NP-C [place]  [facility] Pgr: [phone] or if no answer [phone] 12/23/2014, 11:41 AM

## 2014-12-23 NOTE — Procedures (Addendum)
Intubation Procedure Note [name] [id number] 1950/12/25  Procedure: Intubation Indications: Respiratory insufficiency  Procedure Details Consent: Risks of procedure as well as the alternatives and risks of each were explained to the (patient/caregiver).  Consent for procedure obtained. Time Out: Verified patient identification, verified procedure, site/side was marked, verified correct patient position, special equipment/implants available, medications/allergies/relevent history reviewed, required imaging and test results available.  Performed  Maximum sterile technique was used including gloves, hand hygiene and mask.  3  Evaluation Hemodynamic Status: Hypotension treated with pressors; O2 sats: transiently fell during during procedure and currently acceptable Patient's Current Condition: stable Complications: No apparent complications Patient did tolerate procedure well. Chest X-ray ordered to verify placement.  CXR: pending.  ET tube size 7.5. Position 23 at the lip.  Kari Garfinkel MD [place] [facility] Pager [phone] If no answer or after 3pm call: [phone] 12/23/2014, 8:13 PM

## 2014-12-23 NOTE — Progress Notes (Signed)
Patient unable to void, called for bathroom assistance three time but was unable to pass urine each time. Bladder scan done, showed > 400 cc urine in her bladder, pt requested for a foley cath to be placed. [facility] night coverage notified, order for a foley cath received.

## 2014-12-23 NOTE — Care Management Note (Signed)
Case Management Note  Patient Details  Name: [name] MRN: [id number] Date of Birth: [birth year]  Subjective/Objective:         resp distress and failure           Action/Plan:Date:  Sept.6, 2016 U.R. performed for needs and level of care. Will continue to follow for Case Management needs.  [name], RN, BSN, [place]   [phone]   Expected Discharge Date:                  Expected Discharge Plan:  Home/Self Care  In-House Referral:  NA  Discharge planning Services  CM Consult  Post Acute Care Choice:  NA Choice offered to:  NA  DME Arranged:    DME Agency:     HH Arranged:    [place] Agency:     Status of Service:  Completed, signed off  Medicare Important Message Given:    Date Medicare IM Given:    Medicare IM give by:    Date Additional Medicare IM Given:    Additional Medicare Important Message give by:     If discussed at [place] of Stay Meetings, dates discussed:    Additional Comments:  Leeroy Cha, RN 12/23/2014, 10:29 AM

## 2014-12-23 NOTE — Progress Notes (Signed)
PT refused ABG- oriented to time, place, President (RN aware- was at bedside).

## 2014-12-23 NOTE — Procedures (Signed)
Successful placement of triple lumen PICC line to right brachial vein. Length 37cm Tip at lower SVC/RA No complications Ready for use.  Tsosie Billing D PA-C 12/23/14 16:50

## 2014-12-23 NOTE — Progress Notes (Signed)
[facility] PROGRESS NOTE  [name] WUJ:[id number] DOB: 12/11/1950 DOA: 12/29/2014 PCP: Warren Danes, MD Interim summary:  [name] is a 64 y.o. female with a past medical history of COPD, cigarette smoker, history of multiple pneumonias, history of C. difficile, history of partial colectomy and ileostomy, paroxysmal atrial fibrillation hypertension, chronic myelocytic monoclonal leukemia, chronic anemia that requires Epogen injections and occasional blood transfusions, MRSA carrier, GERD presents to the emergency department due to worsening shortness of breath, pleuritic chest pain, fatigue, cold sweats for several days. Her CXR showed bilateral pneumonia. She required 100% oxygen overnight and we were able to transition to [place] oxygen this am.  PCCM consulted for recommendations.    Assessment/Plan: 1. Acute respiratory failure from health care associated pneumonia: - admitted to step down and started her on broad spectrum antibiotics.  - nasal [place]ula oxygen to keep sats greater than 90%. Baseline athome she does not use oxygen.  -  Suspect aspiration in addition to HCAP.  - bronchodilators and she might need steroids.  - PCCM consulted for recommendations.   2. Chronic diarrhea: Has a h/o cdiff colitis and her c diff antigen is positive , but the c diff toxin is negative. Suspect colonization,  Request ID consult if diarrhea does nt improve and she might need treatment for c diff.    3. Non gap acidosis: - from profuse diarrhea - replete electrolytes and bicarb.   4. Hypomagnesemia: Repleted.    5. Acute renal failure; Improving with hydration.  Repeat renal parameters in am.    6. Anemia of chronic disease: Hemoglobin appears to be sable.  Monitor.    7. CMML: Further management as per Dr Alvy Bimler.  She is currently not getting any chemotherapy.      Code Status: full code.  Family Communication: offered to speak with family, reported her daughter is  out of town  And her friend and sister coming later on today.  Disposition Plan: REMAIN inpatient in stepdown for hypotension.    Consultants:  PCCM.   IR  Procedures:  none  Antibiotics:  zyvox  fortaz  HPI/Subjective: Requested to change to regular diet.  Has had diarrhea since many weeks.    Objective: Filed Vitals:   12/23/14 0913  BP:   Pulse: 108  Temp:   Resp: 15    Intake/Output Summary (Last 24 hours) at 12/23/14 1109 Last data filed at 12/23/14 0900  Gross per 24 hour  Intake   2360 ml  Output    875 ml  Net   1485 ml   Filed Weights   01/13/2015 2336  Weight: 60.1 kg (132 lb 7.9 oz)    Exam:   General:  Alert afebrile appears comfortable, has 6 lit [place] oxygen .   Cardiovascular: s1s2, irregular, tachycardic  Respiratory: scattered wheezing posterior, with rhonchi bilateral.   Abdomen: soft non tender non distended has ileostomy with brown stool  Musculoskeletal: no pedal edema, cyanosis or clubbing.   Data Reviewed: Basic Metabolic Panel:  Recent Labs Lab 01/09/2015 1534 01/14/2015 2006 12/23/14 0405  NA 132*  --  133*  K 5.4*  --  5.2*  CL 107  --  109  CO2 18*  --  17*  GLUCOSE 97  --  110*  BUN 38*  --  37*  CREATININE 1.72*  --  1.55*  CALCIUM 6.9*  --  6.9*  MG  --  0.9*  --   PHOS  --  5.4*  --  Liver Function Tests:  Recent Labs Lab 12/25/2014 1534 12/23/14 0405  AST 10* 10*  ALT 9* 9*  ALKPHOS 140* 137*  BILITOT 0.6 0.3  PROT 5.0* 4.9*  ALBUMIN 2.7* 2.6*   No results for input(s): LIPASE, AMYLASE in the last 168 hours. No results for input(s): AMMONIA in the last 168 hours. CBC:  Recent Labs Lab 12/31/2014 1534 12/23/14 0405  WBC 9.0 11.0*  NEUTROABS 3.7 5.2  HGB 9.1* 9.0*  HCT 28.2* 28.9*  MCV 100.7* 102.5*  PLT 209 193   Cardiac Enzymes: No results for input(s): CKTOTAL, CKMB, CKMBINDEX, TROPONINI in the last 168 hours. BNP (last 3 results) No results for input(s): BNP in the last 8760  hours.  ProBNP (last 3 results) No results for input(s): PROBNP in the last 8760 hours.  CBG: No results for input(s): GLUCAP in the last 168 hours.  Recent Results (from the past 240 hour(s))  C difficile quick scan w PCR reflex     Status: Abnormal   Collection Time: 01/08/2015  9:08 PM  Result Value Ref Range Status   C Diff antigen POSITIVE (A) NEGATIVE Final   C Diff toxin NEGATIVE NEGATIVE Final   C Diff interpretation   Final    C. difficile present, but toxin not detected. This indicates colonization. In most cases, this does not require treatment. If patient has signs and symptoms consistent with colitis, consider treatment.    Comment: RESULT CALLED TO, READ BACK BY AND VERIFIED WITH: Q MBEMENA RN [id number] 12/30/2014 A NAVARRO   MRSA PCR Screening     Status: Abnormal   Collection Time: 01/16/2015  9:46 PM  Result Value Ref Range Status   MRSA by PCR POSITIVE (A) NEGATIVE Final    Comment:        The GeneXpert MRSA Assay (FDA approved for NASAL specimens only), is one component of a comprehensive MRSA colonization surveillance program. It is not intended to diagnose MRSA infection nor to guide or monitor treatment for MRSA infections. RESULT CALLED TO, READ BACK BY AND VERIFIED WITHBarnetta Chapel [facility] 0002 12/23/14 A NAVARRO      Studies: Dg Chest 2 View  01/16/2015   CLINICAL DATA:  Chest pain and short of breath.  EXAM: CHEST  2 VIEW  COMPARISON:  Radiograph 10/01/2014  FINDINGS: Stable cardiac silhouette. There is interval increase in bilateral patchy airspace disease with lower lobe predominance. Small pleural effusions. No pneumothorax.  IMPRESSION: Bilateral patchy dense severe airspace disease concerning for multifocal pneumonia versus flash pulmonary edema.   Electronically Signed   By: Suzy Bouchard M.D.   On: 12/30/2014 16:54   Ct Chest Wo Contrast  12/19/2014   CLINICAL DATA:  Pneumonia versus pulmonary edema on chest radiograph.  EXAM: CT CHEST WITHOUT CONTRAST   TECHNIQUE: Multidetector CT imaging of the chest was performed following the standard protocol without IV contrast.  COMPARISON:  Chest radiograph 12/18/2014  FINDINGS: Mediastinum/Nodes: No axillary or supraclavicular adenopathy. There are multiple mediastinal lymph nodes present. Largest in the subcarinal location measuring 2.2 cm. RIGHT lower paratracheal lymph node measures 1.6 cm. The bilateral hilar lymph nodes. No pericardial fluid. Coronary calcifications noted. Esophagus is normal.  Lungs/Pleura: There is dense lower lobe airspace disease which reaches confluence. This also involvement of the RIGHT middle lobe and lingula. Airspace disease extends to the upper lobes additionally. Discrete nodule in the RIGHT upper lobe measures 10 mm. Nodule in lobe LEFT upper lobe measures 14 mm.  Upper abdomen: Spleen appears enlarged.  Adrenal glands are normal. No upper abdominal adenopathy identified.  Musculoskeletal: Degenerate change in the spine. No aggressive osseous lesion.  IMPRESSION: 1. Dense diffuse bilateral airspace disease or reaching confluence/ consolidation and involving all the lobes of LEFT and RIGHT lung. Several discrete nodules are present in the upper lobes. 2. Bulky mediastinal adenopathy. 3. This combination of dense nodular consolidation in the lungs and bulky mediastinal adenopathy raises the concern for malignancy such as lymphoma.   Electronically Signed   By: Suzy Bouchard M.D.   On: 01/05/2015 18:36    Scheduled Meds: . aspirin  325 mg Oral q morning - 10a  . bisoprolol  5 mg Oral Daily  . budesonide (PULMICORT) nebulizer solution  0.25 mg Nebulization BID  . buPROPion  300 mg Oral QAC breakfast  . cefTAZidime (FORTAZ)  IV  2 g Intravenous Q2000  . celecoxib  200 mg Oral Daily  . Chlorhexidine Gluconate Cloth  6 each Topical Q0600  . diltiazem  120 mg Oral Daily  . enoxaparin (LOVENOX) injection  30 mg Subcutaneous Q24H  . estradiol  2 mg Oral QAC breakfast  .  ipratropium-albuterol  3 mL Nebulization Q6H  . linezolid  600 mg Intravenous Q12H  . mupirocin ointment  1 application Nasal BID  . pantoprazole  40 mg Oral BID  . vitamin A & D       Continuous Infusions: . sodium chloride 100 mL (01/05/2015 2107)    Principal Problem:   HCAP (healthcare-associated pneumonia) Active Problems:   Diarrhea   Dehydration   Chronic back pain   Atrial fibrillation   CMML (chronic myelomonocytic leukemia)   Hyperkalemia   Anemia of chronic disease   GERD (gastroesophageal reflux disease)    Time spent: 35 minutes    [place] [facility] Pager [phone] . If 7PM-7AM, please contact night-coverage at [email or web address], password [facility] 12/23/2014, 11:09 AM  LOS: 1 day

## 2014-12-23 NOTE — Progress Notes (Signed)
eLink Physician-Brief Progress Note Patient Name: [name] DOB: [birth year] MRN: [id number]   Date of Service  12/23/2014  HPI/Events of Note  Hypocalcemia  eICU Interventions  Calcium replaced     Intervention Category Intermediate Interventions: Electrolyte abnormality - evaluation and management  DETERDING,ELIZABETH 12/23/2014, 11:26 PM

## 2014-12-23 NOTE — Progress Notes (Signed)
In room with pt and sister at beside obtaining blood from rt upper arm picc. Had also counted home meds with pt. Pt reported being short of breath and requesting inhaler. Explained md had ordered  prn neb treatment. Pt become increasing anxious and sister requested breathing treatment. Unit secretary called resp at this time to request neb treatment.

## 2014-12-23 NOTE — Progress Notes (Signed)
[facility] PCCM PROGRESS NOTE  S: Called to bedisde by [facility] MD as patients respiratory status was beginning to deteriorate. He blood pressure had been low over the past few hours and she had been receiving volume resuscitation as therapy for this. Halfway through a bolus, she very suddenly developed marked SOB requiring NRB, she was gargling and having frothy secretions. CXR was ordered and showed significant diffuse opacification consistent with pulmonary edema. I arrived at bedside around this time. Patient unable to cooperate due to agitation and AMS.  Unable to obtain O2 sats on NRB due to SAT waveform not picking up anywhere.  O: BP 86/51 mmHg  Pulse 119  Temp(Src) 98.6 F (37 C) (Oral)  Resp 11  Ht 5\' 5"  (1.651 m)  Wt 60.1 kg (132 lb 7.9 oz)  BMI 22.05 kg/m2  SpO2 89%  General:  Chronically ill appearing female in significant distress Neuro:  Alert, agitated, unable to cooperate with exam HEENT:  [place]/AT, PERRL, mild JVD noted Cardiovascular:  Tachy, regular. No MRG Lungs:  Coarse bilateral breath sounds Abdomen:  Soft, non-distended. Ileostomy Musculoskeletal:  ROM intact, no acute deformity Skin:  Intact, MMM   A/P: Acute hypoxemic respiratory failure in setting of pulmonary edema/volume overload HCAP vs aspiration ?COPD without acute exacerbation - STAT intubation - Lung protective vent support (8cc/kg Vt) - May need increased PEEP - ABG after intubation and PRN - Fent gtt, PRN versed - RASS goal -1 to -2 - Consider lasix if BP will support  Shock septic vs cardiogenic, currently RSI component Volume overload - Levophed PRN for MAP goal > 65 mm/Hg - CVP monitoring via PICC if able - Monitor I&O - May need diuresis once BP can tolerate   Georgann Housekeeper, ACNP [facility] Pulmonology/Critical Care Pager [phone] or [phone]

## 2014-12-23 NOTE — Progress Notes (Signed)
Continued dyspneic, resp administered treatment. Pt refusing to wear nonrebreather. Md and elink md notified. Chest xray ordered and fluid bolus stopped. Ccm at bedside to evaluate,  talked to sister and pt, agreed to intubation.

## 2014-12-23 NOTE — Progress Notes (Signed)
ANTIBIOTIC CONSULT NOTE - Follow up  Pharmacy Consult for Ceftazidime Indication: HCAP  Allergies  Allergen Reactions  . Vancomycin Hives and Rash    wheezing  . Ativan [Lorazepam] Other (See Comments)    "makes me crazy"  . Codeine Nausea And Vomiting  . Tetanus Toxoids Other (See Comments)    Serum reaction  . Penicillins Hives and Rash  . Xarelto [Rivaroxaban] Hives and Rash    (May not be allergic. Vancomycin was also being taken when reaction occurred.)    Patient Measurements: Height: 5\' 5"  (165.1 cm) Weight: 132 lb 7.9 oz (60.1 kg) IBW/kg (Calculated) : 57  Vital Signs: Temp: 98.6 F (37 C) (09/06 1200) Temp Source: Oral (09/06 1200) BP: 133/105 mmHg (09/06 1000) Pulse Rate: 108 (09/06 0913) Intake/Output from previous day: 09/05 0701 - 09/06 0700 In: 1560 [P.O.:360; I.V.:1100; IV Piggyback:100] Out: 875 [Urine:575; Stool:300]  Labs:  Recent Labs  01/16/2015 1534 12/23/14 0405  WBC 9.0 11.0*  HGB 9.1* 9.0*  PLT 209 193  CREATININE 1.72* 1.55*   Estimated Creatinine Clearance: 32.6 mL/min (by C-G formula based on Cr of 1.55).  Assessment: 64 year old female presents with increased shortness of breath over the last 4 days associated which chest pain. She's been treated multiple times for pneumonia with her primary care physician recently. She has been admitted to the hospital previously with ARDS and multifocal pneumonia.  Allergy to vancomycin with wheezing noted. Also allergic to PCNs, but has tolerated numerous cephalosporins.  ID ok with using Zyvox, as vanco and dapto are contraindicated.  Levofloxacin ordered x 1 in ED, but not given.  Pharmacy is consulted to dose ceftazidime  9/5 >> Ceftazidime >> 9/5 >> Zyvox >>    Today, 12/23/2014:  Temp: afebrile  WBC: increased, 11  Renal: SCr elevated but improved to 1.55; CrCl 32 ml/min CG  Blood culture in progress, Cdiff toxin negative   Goal of Therapy:  Eradication of infection Appropriate  antibiotic dosing for indication and renal function  Plan:  Day 2 antibiotics  Increase to Ceftazidime 2g IV q12 hr  Monitor for Zyvox ADEs   Follow clinical course, renal function, culture results as available  Follow for de-escalation of antibiotics and LOT  Gretta Arab PharmD, BCPS Pager [phone] 12/23/2014 1:43 PM

## 2014-12-24 ENCOUNTER — Inpatient Hospital Stay (HOSPITAL_COMMUNITY): Payer: 59

## 2014-12-24 DIAGNOSIS — R197 Diarrhea, unspecified: Secondary | ICD-10-CM

## 2014-12-24 LAB — CBC WITH DIFFERENTIAL/PLATELET
BASOS ABS: 0 10*3/uL (ref 0.0–0.1)
Basophils Relative: 0 % (ref 0–1)
EOS ABS: 0 10*3/uL (ref 0.0–0.7)
Eosinophils Relative: 0 % (ref 0–5)
HEMATOCRIT: 22.3 % — AB (ref 36.0–46.0)
Hemoglobin: 7.4 g/dL — ABNORMAL LOW (ref 12.0–15.0)
LYMPHS ABS: 1.6 10*3/uL (ref 0.7–4.0)
Lymphocytes Relative: 16 % (ref 12–46)
MCH: 32.6 pg (ref 26.0–34.0)
MCHC: 33.2 g/dL (ref 30.0–36.0)
MCV: 98.2 fL (ref 78.0–100.0)
MONO ABS: 4.3 10*3/uL — AB (ref 0.1–1.0)
Monocytes Relative: 42 % — ABNORMAL HIGH (ref 3–12)
NEUTROS ABS: 4.3 10*3/uL (ref 1.7–7.7)
Neutrophils Relative %: 42 % — ABNORMAL LOW (ref 43–77)
PLATELETS: 175 10*3/uL (ref 150–400)
RBC: 2.27 MIL/uL — AB (ref 3.87–5.11)
RDW: 19 % — AB (ref 11.5–15.5)
WBC: 10.2 10*3/uL (ref 4.0–10.5)

## 2014-12-24 LAB — COMPREHENSIVE METABOLIC PANEL
ALBUMIN: 2 g/dL — AB (ref 3.5–5.0)
ALT: 8 U/L — ABNORMAL LOW (ref 14–54)
ANION GAP: 8 (ref 5–15)
AST: 11 U/L — ABNORMAL LOW (ref 15–41)
Alkaline Phosphatase: 125 U/L (ref 38–126)
BILIRUBIN TOTAL: 0.7 mg/dL (ref 0.3–1.2)
BUN: 37 mg/dL — ABNORMAL HIGH (ref 6–20)
CHLORIDE: 94 mmol/L — AB (ref 101–111)
CO2: 24 mmol/L (ref 22–32)
Calcium: 6.4 mg/dL — CL (ref 8.9–10.3)
Creatinine, Ser: 1.55 mg/dL — ABNORMAL HIGH (ref 0.44–1.00)
GFR calc Af Amer: 39 mL/min — ABNORMAL LOW (ref >60)
GFR calc non Af Amer: 34 mL/min — ABNORMAL LOW (ref >60)
GLUCOSE: 279 mg/dL — AB (ref 65–99)
POTASSIUM: 4 mmol/L (ref 3.5–5.1)
Sodium: 126 mmol/L — ABNORMAL LOW (ref 135–145)
TOTAL PROTEIN: 4 g/dL — AB (ref 6.5–8.1)

## 2014-12-24 LAB — BLOOD GAS, ARTERIAL
Acid-base deficit: 7.5 mmol/L — ABNORMAL HIGH (ref 0.0–2.0)
Acid-base deficit: 9.5 mmol/L — ABNORMAL HIGH (ref 0.0–2.0)
Bicarbonate: 17.6 mEq/L — ABNORMAL LOW (ref 20.0–24.0)
Bicarbonate: 18.1 mEq/L — ABNORMAL LOW (ref 20.0–24.0)
DRAWN BY: 422461
Drawn by: 422461
FIO2: 0.7
FIO2: 0.8
LHR: 20 {breaths}/min
MECHVT: 460 mL
MECHVT: 500 mL
O2 Saturation: 98.6 %
O2 Saturation: 98.7 %
PATIENT TEMPERATURE: 98.9
PCO2 ART: 48.2 mmHg — AB (ref 35.0–45.0)
PEEP: 5 cmH2O
PEEP: 5 cmH2O
PO2 ART: 138 mmHg — AB (ref 80.0–100.0)
PO2 ART: 157 mmHg — AB (ref 80.0–100.0)
Patient temperature: 98.3
RATE: 20 resp/min
TCO2: 17.5 mmol/L (ref 0–100)
TCO2: 17.6 mmol/L (ref 0–100)
pCO2 arterial: 39.5 mmHg (ref 35.0–45.0)
pH, Arterial: 7.189 — CL (ref 7.350–7.450)
pH, Arterial: 7.282 — ABNORMAL LOW (ref 7.350–7.450)

## 2014-12-24 LAB — GLUCOSE, CAPILLARY: Glucose-Capillary: 117 mg/dL — ABNORMAL HIGH (ref 65–99)

## 2014-12-24 LAB — LEGIONELLA ANTIGEN, URINE

## 2014-12-24 LAB — PROCALCITONIN
PROCALCITONIN: 0.57 ng/mL
Procalcitonin: 1.59 ng/mL

## 2014-12-24 LAB — HIV ANTIBODY (ROUTINE TESTING W REFLEX): HIV SCREEN 4TH GENERATION: NONREACTIVE

## 2014-12-24 LAB — TROPONIN I

## 2014-12-24 MED ORDER — FAMOTIDINE 40 MG/5ML PO SUSR: 20.0000 mg | Freq: Two times a day (BID) | ORAL | Status: DC

## 2014-12-24 MED ORDER — ETOMIDATE 2 MG/ML IV SOLN
INTRAVENOUS | Status: AC
  Administered 2014-12-23: 20 mg via INTRAVENOUS
  Filled 2014-12-24: qty 20

## 2014-12-24 MED ORDER — LIDOCAINE HCL (CARDIAC) 20 MG/ML IV SOLN
INTRAVENOUS | Status: AC
  Filled 2014-12-24: qty 5

## 2014-12-24 MED ORDER — ROCURONIUM BROMIDE 50 MG/5ML IV SOLN
INTRAVENOUS | Status: AC
  Administered 2014-12-23: 30 mg via INTRAVENOUS
  Filled 2014-12-24: qty 2

## 2014-12-24 MED ORDER — ROCURONIUM BROMIDE 50 MG/5ML IV SOLN
1.0000 mg/kg | Freq: Once | INTRAVENOUS | Status: DC
  Administered 2014-12-23: 30 mg via INTRAVENOUS

## 2014-12-24 MED ORDER — VITAL AF 1.2 CAL PO LIQD
1000.0000 mL | ORAL | Status: DC
  Administered 2014-12-24 – 2014-12-26 (×3): 1000 mL
  Filled 2014-12-24 (×5): qty 1000

## 2014-12-24 MED ORDER — ETOMIDATE 2 MG/ML IV SOLN
0.3000 mg/kg | Freq: Once | INTRAVENOUS | Status: DC
  Administered 2014-12-23: 20 mg via INTRAVENOUS

## 2014-12-24 MED ORDER — SUCCINYLCHOLINE CHLORIDE 20 MG/ML IJ SOLN
INTRAMUSCULAR | Status: AC
  Filled 2014-12-24: qty 1

## 2014-12-24 MED ORDER — CHLORHEXIDINE GLUCONATE 0.12 % MT SOLN
OROMUCOSAL | Status: AC
  Filled 2014-12-24: qty 15

## 2014-12-24 MED ORDER — RANITIDINE HCL 150 MG/10ML PO SYRP
150.0000 mg | ORAL_SOLUTION | Freq: Every day | ORAL | Status: DC
  Administered 2014-12-25 – 2014-12-27 (×3): 150 mg
  Filled 2014-12-24 (×5): qty 10

## 2014-12-24 MED ORDER — TOBRAMYCIN SULFATE 80 MG/2ML IJ SOLN
7.0000 mg/kg | Freq: Once | INTRAMUSCULAR | Status: AC
  Administered 2014-12-24: 400 mg via INTRAVENOUS
  Filled 2014-12-24: qty 10

## 2014-12-24 MED ORDER — VITAL HIGH PROTEIN PO LIQD
1000.0000 mL | ORAL | Status: DC
  Filled 2014-12-24: qty 1000

## 2014-12-24 NOTE — Progress Notes (Signed)
Initial Nutrition Assessment  DOCUMENTATION CODES:   Not applicable  INTERVENTION:  - Will order Vital AF 1.2 @ 50 mL/hr which provides 1440 kcal (96% kcal needs), 90 grams protein, and 973 mL free water - RD will continue to monitor for needs  NUTRITION DIAGNOSIS:   Inadequate oral intake related to inability to eat as evidenced by NPO status.  GOAL:   Patient will meet greater than or equal to 90% of their needs  MONITOR:   Vent status, TF tolerance, Weight trends, Labs, I & O's  REASON FOR ASSESSMENT:   Malnutrition Screening Tool, Ventilator, Consult Enteral/tube feeding initiation and management  ASSESSMENT:   64 y.o. female with a past medical history of COPD, cigarette smoker, history of multiple pneumonias, history of C. difficile, history of partial colectomy and ileostomy, paroxysmal atrial fibrillation hypertension, chronic myelocytic monoclonal leukemia, chronic anemia that requires Epogen injections and occasional blood transfusions, MRSA carrier, GERD who comes to the emergency department due to worsening shortness of breath, pleuritic chest pain, fatigue, cold sweats for several days. She has also been having occasionally productive cough of mucus, decreased appetite, difficulty sleeping and dehydration due to high ileostomy output with decreased oral intake.   Pt seen for MST, vent, RD to manage TF consult. BMI indicates normal weight status.   Patient is currently intubated on ventilator support. OGT currently in place.  MV: 10.4 L/min Temp (24hrs), Avg:98.7 F (37.1 C), Min:98.3 F (36.8 C), Max:98.9 F (37.2 C)  Propofol: none  No family in the room to provide PTA information. No muscle or fat wasting noted. Per chart review, pt lost 8 lbs (6% body weight) from 10/01/14-12/11/14 which is not significant for time frame.  Not currently meeting needs. Medications reviewed. Labs reviewed; Na: 126 mmol/L, Cl: 94 mmol/L, BUN/creatinine elevated, Ca: 6.4 mg/dL,  GFR: 34.  Drips: Fentanyl @ 150 mcg/hr, Levophed @ 15 mcg/min.   Diet Order:  Diet NPO time specified  Skin:  Reviewed, no issues  Last BM:  9/6  Height:   Ht Readings from Last 1 Encounters:  01/16/2015 5\' 5"  (1.651 m)    Weight:   Wt Readings from Last 1 Encounters:  12/24/14 149 lb 0.5 oz (67.6 kg)    Ideal Body Weight:  56.82 kg (kg)  BMI:  Body mass index is 24.8 kg/(m^2).  Estimated Nutritional Needs:   Kcal:  1500  Protein:  81-101 grams  Fluid:  2 L/day  EDUCATION NEEDS:   No education needs identified at this time     Jarome Matin, RD, LDN Inpatient Clinical Dietitian Pager # [phone] After hours/weekend pager # [phone]

## 2014-12-24 NOTE — Progress Notes (Addendum)
Inpatient Diabetes Program Recommendations  AACE/ADA: New Consensus Statement on Inpatient Glycemic Control (2013)  Target Ranges:  Prepandial:   less than 140 mg/dL      Peak postprandial:   less than 180 mg/dL (1-2 hours)      Critically ill patients:  140 - 180 mg/dL   Review of blood glucose: Results for [place]OR[place], NOLTON (MRN [id number]) as of 12/24/2014 13:18  Ref. Range 12/23/2014 21:50 12/24/2014 05:36  Glucose Latest Ref Range: 65-99 mg/dL 294 (H) 279 (H)   Diabetes history: None noted  Consider checking CBG's q 4 hours and add Novolog correction q 4 hours (ICU Glycemic Control Order Set).     Thanks, Adah Perl, RN, BC-ADM Inpatient Diabetes Coordinator Pager [phone] (8a-5p)

## 2014-12-24 NOTE — Progress Notes (Addendum)
PULMONARY / CRITICAL CARE MEDICINE   Name: [name] MRN: [id number] DOB: Sep 01, 1950    ADMISSION DATE:  01/02/2015 CONSULTATION DATE:  9/06  REFERRING MD :  Dr. Karleen Hampshire   CHIEF COMPLAINT:  Shortness of breath   INITIAL PRESENTATION: [name], current occasional smoker (1-2 cigarettes per day) who was admitted on 9/5 with worsening shortness of breath.    STUDIES:  9/05  CT Chest w/o >> diffuse bilateral airspace disease, consolidation involving all lobes of L/R lung, several discrete nodules present in upper lobes, bulky mediastinal adenopathy  SIGNIFICANT EVENTS: 9/05  Admit with worsening SOB  9/6 intubated for hypoxia while receiving fluid bolus ? 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    On presentation, the patient reported approximately 2 weeks of worsening shortness of breath and 4 days of chest pain.  Prior to admit, she was not on oxygen at home. She required no assistive devices for ADL's / walker etc.  She has limited activity tolerance but remained able to go out for lunch / meals without assist.  She is followed by Dr. Annamaria Boots for Pulmonary and has been worked for aspiration by GI given recurrent infiltrates which was negative.  However, there was concern for nocturnal aspiration.  In the past, she has had dyspnea associated with periods of worsening anemia.    Initial evaluation was notable for increased work of breathing and hypoxemia with saturations into the 70's.  She refused bipap on admit.  Labs Na 132, K 5.4,  CO2 18, BUN 38, sr cr 1.72, alk phos 140, albumin 2.7, AST 10, ALT 9, WBC 11.0, Hgb 9, MCV 102.5 and platelets 193.  The patient was admitted to SDU per [facility]. She was further evaluated with a CT of the chest without contrast which showed diffuse bilateral airspace disease, consolidation involving all lobes of L/R lung, several discrete nodules present in upper lobes, & bulky mediastinal adenopathy.  PCCM consulted for evaluation.    Last hospitalization was 11/2013 for dehydration, PAF & C-Diff.  Dr. Annamaria Boots assessed immunoglobulins in 03/2014 and were within normal limits.  She notes her sputum production is usually white and had not changed with this episode (quantity or color).  She denies fevers / chills, n/v.     SUBJECTIVE:  Afebrile Poor UO On pressors Awake & interactive  VITAL SIGNS: Temp:  [98.3 F (36.8 C)-98.9 F (37.2 C)] 98.3 F (36.8 C) (09/07 0000) Pulse Rate:  [70-120] 92 (09/07 0600) Resp:  [11-24] 20 (09/07 0600) BP: (57-155)/(29-105) 110/62 mmHg (09/07 0600) SpO2:  [74 %-100 %] 100 % (09/07 0733) FiO2 (%):  [50 %-100 %] 50 % (09/07 0733) Weight:  [149 lb 0.5 oz (67.6 kg)] 149 lb 0.5 oz (67.6 kg) (09/07 0600)   HEMODYNAMICS:     VENTILATOR SETTINGS: Vent Mode:  [-] PRVC FiO2 (%):  [50 %-100 %] 50 % Set Rate:  [18 bmp-20 bmp] 20 bmp Vt Set:  [460 mL-500 mL] 500 mL PEEP:  [5 cmH20] 5 cmH20 Plateau Pressure:  [17 cmH20-23 cmH20] 17 cmH20  INTAKE / OUTPUT:  Intake/Output Summary (Last 24 hours) at 12/24/14 0903 Last data filed at 12/24/14 0636  Gross per 24 hour  Intake 4378.67 ml  Output    590 ml  Net 3788.67 ml    PHYSICAL EXAMINATION: General:  Thin, frail adult, intubated Neuro: RASS+1, MAE HEENT:  MM pink/dry, no jvd  Cardiovascular:  s1s2 rrr, gallop 2/6 SEM  Lungs:  resp's even/non-labored on [place] O2, crackles in LLL, coarse anterior Abdomen:  Ileostomy, mid-line old abd wound dressing c/d/i Musculoskeletal:  No acute deformities   Skin:  Warm/dry, no edema   LABS:  CBC  Recent Labs Lab 12/23/14 0405 12/23/14 2150 12/24/14 0536  WBC 11.0* 15.3* 10.2  HGB 9.0* 7.6* 7.4*  HCT 28.9* 23.6* 22.3*  PLT 193 225 175   Coag's No results for input(s): APTT, INR in the last 168 hours.   BMET  Recent Labs Lab 12/23/14 0405 12/23/14 2150 12/24/14 0536  NA 133* 129* 126*  K 5.2* 4.6 4.0  CL 109 98* 94*  CO2 17* 24 24  BUN 37* 37* 37*  CREATININE 1.55* 1.59* 1.55*  GLUCOSE 110* 294* 279*   Electrolytes  Recent Labs Lab 01/05/2015 2006 12/23/14 0405 12/23/14 0408 12/23/14 2150 12/24/14 0536  CALCIUM  --  6.9*  --  6.1* 6.4*  MG 0.9*  --  2.0  --   --   PHOS 5.4*  --   --   --   --    Sepsis Markers  Recent Labs Lab 12/23/2014 1611 12/23/14 1830 12/23/14 2150 12/24/14 0536  LATICACIDVEN 1.10 1.1  --   --   PROCALCITON  --   --  0.57 1.59   ABG  Recent Labs Lab 12/23/14 2110 12/24/14 0020 12/24/14 0427  PHART 7.037* 7.189* 7.282*  PCO2ART 66.6* 48.2* 39.5  PO2ART 166* 157* 138*   Liver Enzymes  Recent Labs Lab 12/20/2014 1534 12/23/14 0405 12/24/14 0536  AST 10* 10* 11*  ALT 9* 9* 8*  ALKPHOS 140* 137* 125  BILITOT 0.6 0.3 0.7  ALBUMIN 2.7* 2.6* 2.0*   Cardiac Enzymes  Recent Labs Lab 12/23/14 2150 12/24/14 0536  TROPONINI <0.03 <0.03     Glucose No results for input(s): GLUCAP in the last 168 hours.  Imaging Ir Fluoro Guide Cv Line Right  12/23/2014   INDICATION: Poor venous access, request for peripherally inserted central catheter for intravenous medications.  EXAM: ULTRASOUND AND FLUOROSCOPIC GUIDED PICC LINE INSERTION  MEDICATIONS: None.  CONTRAST:  None  FLUOROSCOPY TIME:  1 minute 30 seconds.  COMPLICATIONS: None immediate  TECHNIQUE: The procedure, risks, benefits, and alternatives were explained to the patient and informed written consent was obtained. A timeout was performed prior to the initiation of the procedure.  The right upper extremity was prepped with  chlorhexidine in a sterile fashion, and a sterile drape was applied covering the operative field. Maximum barrier sterile technique with sterile gowns and gloves were used for the procedure. A timeout was performed prior to the initiation of the procedure. Local anesthesia was provided with 1% lidocaine.  Under direct ultrasound guidance, the right brachial vein was accessed with a micropuncture kit after the overlying soft tissues were anesthetized with 1% lidocaine. An ultrasound image was saved for documentation purposes. A guidewire was advanced to the level of the superior caval-atrial junction for measurement purposes and the PICC line was cut to length. A peel-away sheath was placed and a 37 cm, 5 [place], triple lumen was inserted to level of  the superior caval-atrial junction. A post procedure spot fluoroscopic was obtained. The catheter easily aspirated and flushed and was sutured in place. A dressing was placed. The patient tolerated the procedure well without immediate post procedural complication.  FINDINGS: After catheter placement, the tip lies within the superior cavoatrial junction. The catheter aspirates and flushes normally and is ready for immediate use.  IMPRESSION: Successful ultrasound and fluoroscopic guided placement of a right brachial vein approach, 37 cm, 5 French, triple lumen PICC with tip at the superior caval-atrial junction. The PICC line is ready for immediate use.  Read By:  Tsosie Billing PA-C   Electronically Signed   By: Aletta Edouard M.D.   On: 12/23/2014 17:02   Ir US Guide Vasc Access Right  12/23/2014   INDICATION: Poor venous access, request for peripherally inserted central catheter for intravenous medications.  EXAM: ULTRASOUND AND FLUOROSCOPIC GUIDED PICC LINE INSERTION  MEDICATIONS: None.  CONTRAST:  None  FLUOROSCOPY TIME:  1 minute 30 seconds.  COMPLICATIONS: None immediate  TECHNIQUE: The procedure, risks, benefits, and alternatives were explained to the patient  and informed written consent was obtained. A timeout was performed prior to the initiation of the procedure.  The right upper extremity was prepped with chlorhexidine in a sterile fashion, and a sterile drape was applied covering the operative field. Maximum barrier sterile technique with sterile gowns and gloves were used for the procedure. A timeout was performed prior to the initiation of the procedure. Local anesthesia was provided with 1% lidocaine.  Under direct ultrasound guidance, the right brachial vein was accessed with a micropuncture kit after the overlying soft tissues were anesthetized with 1% lidocaine. An ultrasound image was saved for documentation purposes. A guidewire was advanced to the level of the superior caval-atrial junction for measurement purposes and the PICC line was cut to length. A peel-away sheath was placed and a 37 cm, 5 [place], triple lumen was inserted to level of the superior caval-atrial junction. A post procedure spot fluoroscopic was obtained. The catheter easily aspirated and flushed and was sutured in place. A dressing was placed. The patient tolerated the procedure well without immediate post procedural complication.  FINDINGS: After catheter placement, the tip lies within the superior cavoatrial junction. The catheter aspirates and flushes normally and is ready for immediate use.  IMPRESSION: Successful ultrasound and fluoroscopic guided placement of a right brachial vein approach, 37 cm, 5 French, triple lumen PICC with tip at the superior caval-atrial junction. The PICC line is ready for immediate use.  Read By:  Tsosie Billing PA-C   Electronically Signed   By: Aletta Edouard M.D.   On: 12/23/2014 17:02   Portable Chest Xray  12/24/2014   CLINICAL DATA:  Pulmonary edema.  EXAM: PORTABLE CHEST - 1 VIEW  COMPARISON:  12/23/2014.  CT 12/21/2014.  FINDINGS: Endotracheal to and right PICC line in stable position. Interim placement of NG tube, its tip is in the upper  portion of the stomach. Side hole in the distal esophagus. Advancement of the NG tube by 10 cm suggested. Persistent mediastinal fullness consistent with previously identified adenopathy . Heart size normal. Diffuse bilateral nodular pulmonary infiltrates again noted. Differential diagnosis includes severe diffuse pneumonia, diffuse granulomas disease, and or diffuse metastatic disease. Small pleural effusions again noted. No pneumothorax.  IMPRESSION: 1. Interim placement of NG tube, its tip is in the upper portion of the stomach. Side hole is over the distal esophagus. Advancement of approximately 10 cm suggested. Endotracheal tube and right  PICC line in stable position. 2. Persistent diffuse bilateral nodular pulmonary infiltrates again noted without interim change. Again differential diagnosis includes diffuse pneumonia including diffuse granulomas disease and/or diffuse metastatic disease. Small pleural effusions again noted.  3. Persistent mediastinal fullness consistent with previously identified adenopathy . These results will be called to the ordering clinician or representative by the Radiologist Assistant, and communication documented in the PACS or zVision Dashboard.   Electronically Signed   By: Marcello Moores  Register   On: 12/24/2014 07:07   Dg Chest Port 1 View  12/23/2014   CLINICAL DATA:  Difficult intubation.  EXAM: PORTABLE CHEST - 1 VIEW  COMPARISON:  Chest radiograph from earlier today.  FINDINGS: Right rotated chest radiograph. Endotracheal tube tip is 4 cm above the carina. Right PICC terminates in the lower third of the superior vena cava. Stable cardiomediastinal silhouette with normal heart size. No pneumothorax. Stable small bilateral pleural effusions, right greater than left. Stable extensive patchy consolidation and confluent nodular opacities throughout both lungs.  IMPRESSION: 1. Well-positioned endotracheal tube. 2. Stable small bilateral pleural effusions, right greater than left. 3.  Stable extensive patchy consolidation in confluent nodular opacities throughout both lungs, differential includes multifocal pneumonia and/or confluent metastatic disease.   Electronically Signed   By: Ilona Sorrel M.D.   On: 12/23/2014 20:46   Dg Chest Port 1 View  12/23/2014   CLINICAL DATA:  Hypoxia.  EXAM: PORTABLE CHEST - 1 VIEW  COMPARISON:  December 22, 2014.  FINDINGS: Stable cardiomediastinal silhouette. Significantly increased bilateral diffuse interstitial and airspace opacities are noted throughout both lungs concerning for pneumonia or possibly edema. No pneumothorax is noted. Possible bilateral pleural effusions cannot be excluded. Interval placement of right-sided PICC line is noted with distal tip in expected position of cavoatrial junction. Bony thorax appears intact.  IMPRESSION: Interval placement of right-sided PICC line with tip in grossly good position. Significantly increased bilateral diffuse lung opacities are noted concerning for worsening pneumonia or edema.   Electronically Signed   By: Marijo Conception, M.D.   On: 12/23/2014 19:26     ASSESSMENT / PLAN:  PULMONARY A:Acute hypoxemic respiratory failure in setting of pulmonary edema/volume overload HCAP vs aspiration -Bilateral nodular Infiltrates - ? Of chronic silent / nocturnal aspiration, has had negative GI work up in the past Mediastinal Adenopathy  COPD - not defined with PFT's  Tobacco Abuse  P:   Continue broad spectrum abx, treat as HCAP Budesonide BID +Duoneb Q6 Smoking cessation  Proceed with FOB & BAL given nodular appearance of opacities Aspiration precautions  Keep RR 24 to avoid autoPEEP  CARDIOVASCULAR A:  PAF Hypertension  Septic shock  P:  Levo gtt to MAP goal 65 Obtain CVP to guide volume  RENAL A:   Acute Kidney Injury - in setting of volume depletion / diarrhea  Gap/Non-Gap Acidosis - in setting of chronic GI loss + AKI Hyponatremia  Hyperkalemia  Hypomagnesemia   P:   Trend BMP /  UOP  Replace electrolytes as indicated  Ct  Bicarb gtt for NAG acidosis, thought related to GI loss.  Pt puts out approximately 1.5L from ileostomy daily   GASTROINTESTINAL A:   Diarrhea , C diff colonisation GERD P:   Start TFs  HEMATOLOGIC A:   CMML Chronic Anemia  P:  Per Primary  INFECTIOUS A:   Diarrhea - chronic, 1.5L ileostomy output per day on average Bilateral Pulmonary Infiltrates P:   BCx2 9/5 >>  C-Diff 9/6 >> colonization  Zyvox,  start date 9/5 >> Tressie Ellis, start date 9/5>>  Neuro - Sedation Fent gtt, RASS goal 0  Summary - BL nodular opacities -treat as HCAP, at risk for Gram neg pna,  Acute hypoxic failure due to ? Superimposed edema  The patient is critically ill with multiple organ systems failure and requires high complexity decision making for assessment and support, frequent evaluation and titration of therapies, application of advanced monitoring technologies and extensive interpretation of multiple databases. Critical Care Time devoted to patient care services described in this note independent of APP time is 35 minutes.   Kara Mead MD. Shade Flood. [facility] Pager [phone] If no response call [phone]    12/24/2014, 9:03 AM

## 2014-12-24 NOTE — Progress Notes (Signed)
CRITICAL VALUE ALERT  Critical value received: Calcium=6.4 Date of notification:  12/24/2014  Time of notification:  0640  Critical value read back:yes  Nurse who received alert: Trula Ore  MD notified (1st page):    Time of first page:   MD notified (2nd page):  Time of second page:  Responding MD:    Time MD responded:

## 2014-12-24 NOTE — Progress Notes (Signed)
CRITICAL VALUE ALERT  Critical value received:  Calcium  Date of notification: 12/23/2014   Time of notification:  2320  Critical value read back: yes   Nurse who received alert:Shonice Wrisley Quentin Cornwall   MD notified (1st page):  CCM MD  Time of first page:  [phone]  MD notified (2nd page):  Time of second page:  Responding MD:    Time MD responded:

## 2014-12-24 NOTE — Progress Notes (Signed)
eLink Physician-Brief Progress Note Patient Name: [name] DOB: 03-04-1951 MRN: [id number]   Date of Service  12/24/2014  HPI/Events of Note  Mixed resp and metabolic acidosis on ABG with pH 7.2/48/157/18  eICU Interventions  Plan; Keep RR unchanged given COPD and risk for breath stacking.   Increase TV from 460 to 500cc ABG in AM     Intervention Category Major Interventions: Acid-Base disturbance - evaluation and management  Ioannis Schuh 12/24/2014, 12:41 AM

## 2014-12-24 NOTE — Progress Notes (Addendum)
ANTIBIOTIC CONSULT NOTE - INITIAL  Pharmacy Consult for Tobramycin Indication: worsening septic shock despite adequate empiric antibiotic coverage  Allergies  Allergen Reactions  . Vancomycin Hives and Rash    wheezing  . Ativan [Lorazepam] Other (See Comments)    "makes me crazy"  . Codeine Nausea And Vomiting  . Tetanus Toxoids Other (See Comments)    Serum reaction  . Penicillins Hives and Rash  . Xarelto [Rivaroxaban] Hives and Rash    (May not be allergic. Vancomycin was also being taken when reaction occurred.)    Patient Measurements: Height: 5\' 5"  (165.1 cm) Weight: 149 lb 0.5 oz (67.6 kg) IBW/kg (Calculated) : 57  Vital Signs: Temp: 98.8 F (37.1 C) (09/07 0800) Temp Source: Oral (09/07 0800) BP: 106/58 mmHg (09/07 1600) Pulse Rate: 47 (09/07 1600) Intake/Output from previous day: 09/06 0701 - 09/07 0700 In: 5178.7 [I.V.:3068.7; IV Piggyback:2110] Out: 590 [Urine:590] Intake/Output from this shift: Total I/O In: 1769.2 [I.V.:1409.2; NG/GT:10; IV Piggyback:350] Out: 425 [Urine:375; Stool:50]  Labs:  Recent Labs  12/23/14 0405 12/23/14 2150 12/24/14 0536  WBC 11.0* 15.3* 10.2  HGB 9.0* 7.6* 7.4*  PLT 193 225 175  CREATININE 1.55* 1.59* 1.55*   Estimated Creatinine Clearance: 32.6 mL/min (by C-G formula based on Cr of 1.55). No results for input(s): VANCOTROUGH, VANCOPEAK, VANCORANDOM, GENTTROUGH, GENTPEAK, GENTRANDOM, TOBRATROUGH, TOBRAPEAK, TOBRARND, AMIKACINPEAK, AMIKACINTROU, AMIKACIN in the last 72 hours.   Microbiology: Recent Results (from the past 720 hour(s))  TECHNOLOGIST REVIEW     Status: None   Collection Time: 11/27/14 11:07 AM  Result Value Ref Range Status   Technologist Review   Final    Metas and Myelocytes present, 4%nRBCs , many teardrops  TECHNOLOGIST REVIEW     Status: None   Collection Time: 12/11/14 10:49 AM  Result Value Ref Range Status   Technologist Review   Final    Metas and Myelocytes, 1% nrbc, mod teardrops and  ovalocytes, few shistocytes present  Blood culture (routine x 2)     Status: None (Preliminary result)   Collection Time: 01/09/2015  6:32 PM  Result Value Ref Range Status   Specimen Description BLOOD LEFT ANTECUBITAL  Final   Special Requests BOTTLES DRAWN AEROBIC AND ANAEROBIC 5CC  Final   Culture  Setup Time   Final    GRAM VARIABLE COCCOBACILLI AEROBIC BOTTLE ONLY CRITICAL RESULT CALLED TO, READ BACK BY AND VERIFIED WITH: D ALDRIDGE,RN AT 1220 12/24/14 BY L BENFIELD    Culture   Final    NO GROWTH 2 DAYS Performed at [facility]    Report Status PENDING  Incomplete  Blood culture (routine x 2)     Status: None (Preliminary result)   Collection Time: 01/09/2015  8:55 PM  Result Value Ref Range Status   Specimen Description BLOOD LEFT HAND  Final   Special Requests BOTTLES DRAWN AEROBIC ONLY 5CC  Final   Culture   Final    NO GROWTH 1 DAY Performed at [facility]    Report Status PENDING  Incomplete  C difficile quick scan w PCR reflex     Status: Abnormal   Collection Time: 01/15/2015  9:08 PM  Result Value Ref Range Status   C Diff antigen POSITIVE (A) NEGATIVE Final   C Diff toxin NEGATIVE NEGATIVE Final   C Diff interpretation   Final    C. difficile present, but toxin not detected. This indicates colonization. In most cases, this does not require treatment. If patient has signs and  symptoms consistent with colitis, consider treatment.    Comment: RESULT CALLED TO, READ BACK BY AND VERIFIED WITH: Q MBEMENA RN [id number] 01/05/2015 A NAVARRO   MRSA PCR Screening     Status: Abnormal   Collection Time: 01/05/2015  9:46 PM  Result Value Ref Range Status   MRSA by PCR POSITIVE (A) NEGATIVE Final    Comment:        The GeneXpert MRSA Assay (FDA approved for NASAL specimens only), is one component of a comprehensive MRSA colonization surveillance program. It is not intended to diagnose MRSA infection nor to guide or monitor treatment for MRSA infections. RESULT CALLED  TO, READ BACK BY AND VERIFIED WITHBarnetta Chapel [facility] RN 0002 12/23/14 A NAVARRO     Medical History: Past Medical History  Diagnosis Date  . Hypertension     She has a past hx of essential  . 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 Inflammation of joint of knee     Since we last last saw her she developed problems with an acute which required surgical drainage by her orthopedist Dr. Durward Fortes.  Marland Kitchen MRSA (methicillin resistant Staphylococcus aureus)     Knee surgery drainage was positive for MRSA and she was treated with 3 weeks of doxycycline successfully.  . Diarrhea     Mild  . Exogenous obesity   . GERD (gastroesophageal reflux disease)     2 hosp.- ischemic colitis - residual of Norovirus, 05/2011- sm. bowel obstruction  . Headache(784.0)     migraine headache on occas, less now than when she was younger   . Arthritis     L hip, back, neck   . History of blood transfusion sept 2013    04/2011- /w ischemic colitis , trouble with matching blood  sept 2013  . Anemia     will see hematology consult prior to surgery, recommended by Dr. Mare Ferrari  . Anemia 12/15/2010  . Ischemic colitis 01/31/2012  . Atrial flutter     during hospitalization, 04/2011- related to anemia & illness/stress   . Pneumonia     04/2011- not hospitalized , pt. denies SOB, changes in chest, breathing  . 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 Dizziness     occasional  . B12 deficiency 02/27/2013  . 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 COPD (chronic obstructive pulmonary disease) 09/26/2013    Medications:  Anti-infectives    Start     Dose/Rate Route Frequency Ordered Stop   12/24/14 1800  tobramycin (NEBCIN) 400 mg in dextrose 5 % 100 mL IVPB     7 mg/kg  57 kg (Ideal) 110 mL/hr over 60 Minutes Intravenous  Once 12/24/14 1710     12/23/14 1345  cefTAZidime (FORTAZ) 2 g in dextrose 5 % 50 mL IVPB     2  g 100 mL/hr over 30 Minutes Intravenous Every 12 hours 12/23/14 1340     01/05/2015 2000  linezolid (ZYVOX) IVPB 600 mg     600 mg 300 mL/hr over 60 Minutes Intravenous Every 12 hours 01/04/2015 1821     01/09/2015 1845  cefTAZidime (FORTAZ) 2 g in dextrose 5 % 50 mL IVPB  Status:  Discontinued     2 g 100 mL/hr over 30 Minutes Intravenous Daily 01/08/2015 1839 12/23/14 1340   12/25/2014 1745  levofloxacin (LEVAQUIN) IVPB 750 mg  Status:  Discontinued  750 mg 100 mL/hr over 90 Minutes Intravenous  Once 12/31/2014 1744 12/31/2014 1820     Assessment: 28 yoF known from ceftazidime/Zyvox dosing for sepsis, HCAP. Still requiring pressors despite appropriate empiric coverage x 3d and now 1/2 BCx with coccobacilli.  CCM would like to add Gm neg double coverage with tobramycin.  Afebrile, WBC improved to WNL, SCr 1.55 with CrCl ~ 32 ml/min  9/5 >> Ceftazidime >> 9/5 >> Zyvox (MD) >>  9/7 >> Tobramycin >>  9/5 blood: IP 9/5 Cidff Ag +, Toxin - 9/5 MRSA PCR: positive 9/5 Strep pneumo Ur Ag: negative  Dose changes/levels: 9/6 Increase to Ceftazidime 2g IV q12 hr for improved SCr   Goal of Therapy:  Eradication of infection Appropriate antibiotic dosing for indication and renal function  Plan:  Day 3 antibiotics  Continue ceftazidime and Zyvox as previously ordered  Tobramycin 400 mg IV x 1 (7 mg/kg)   Check random tobramycin level with morning labs tomorrow; continue dosing per [place] nomogram  Follow clinical course, renal function, culture results as available  Follow for de-escalation of antibiotics and LOT   Reuel Boom, PharmD, BCPS Pager: [phone] 12/24/2014, 5:15 PM

## 2014-12-24 NOTE — Progress Notes (Signed)
eLink Physician-Brief Progress Note Patient Name: [name] DOB: Dec 11, 1950 MRN: [id number]   Date of Service  12/24/2014  HPI/Events of Note  Patient has  Blood Culture positive for Gram Variable Coccobacilli from 01/09/2015. Current empiric antibiotic regimen includes Linazolid and [place]. Patient is currently on vasopressors Rx for septic shock.   eICU Interventions  Will add Tobramycin dosed per pharmacy consult.      Intervention Category Major Interventions: Infection - evaluation and management  Sommer,Steven Eugene 12/24/2014, 4:32 PM

## 2014-12-24 NOTE — Procedures (Signed)
Bronchoscopy Procedure Note [name] [id number] 11/05/1950  Procedure: Bronchoscopy Indications: Diagnostic evaluation of the airways and Obtain specimens for culture and/or other diagnostic studies  Procedure Details Consent: Risks of procedure as well as the alternatives and risks of each were explained to the (patient/caregiver).  Consent for procedure obtained. Time Out: Verified patient identification, verified procedure, site/side was marked, verified correct patient position, special equipment/implants available, medications/allergies/relevent history reviewed, required imaging and test results available.  Performed  In preparation for procedure, patient was given 100% FiO2 and bronchoscope lubricated. Sedation: Benzodiazepines -versed 2 mg & fent 150 mcg i divided doses  Airway entered and the following bronchi were examined: Bronchi.   Procedures performed: BAL performed from BL lower lobes Bronchoscope removed.  , Patient placed back on 100% FiO2 at conclusion of procedure.    Evaluation Hemodynamic Status: BP stable throughout; O2 sats: stable throughout Patient's Current Condition: stable Specimens:  Sent purulent fluid Complications: No apparent complications Patient did tolerate procedure well.   Rigoberto Noel MD 12/24/2014

## 2014-12-25 ENCOUNTER — Other Ambulatory Visit: Payer: 59

## 2014-12-25 ENCOUNTER — Ambulatory Visit: Payer: 59

## 2014-12-25 ENCOUNTER — Inpatient Hospital Stay (HOSPITAL_COMMUNITY): Payer: 59

## 2014-12-25 LAB — PROCALCITONIN: Procalcitonin: 1.88 ng/mL

## 2014-12-25 LAB — CBC WITH DIFFERENTIAL/PLATELET
BASOS ABS: 0 10*3/uL (ref 0.0–0.1)
Basophils Relative: 0 % (ref 0–1)
EOS ABS: 0 10*3/uL (ref 0.0–0.7)
EOS PCT: 0 % (ref 0–5)
HCT: 23 % — ABNORMAL LOW (ref 36.0–46.0)
Hemoglobin: 7.6 g/dL — ABNORMAL LOW (ref 12.0–15.0)
Lymphocytes Relative: 19 % (ref 12–46)
Lymphs Abs: 1.5 10*3/uL (ref 0.7–4.0)
MCH: 32.6 pg (ref 26.0–34.0)
MCHC: 33 g/dL (ref 30.0–36.0)
MCV: 98.7 fL (ref 78.0–100.0)
MONO ABS: 4.3 10*3/uL — AB (ref 0.1–1.0)
Monocytes Relative: 53 % — ABNORMAL HIGH (ref 3–12)
NEUTROS PCT: 28 % — AB (ref 43–77)
Neutro Abs: 2.3 10*3/uL (ref 1.7–7.7)
PLATELETS: 182 10*3/uL (ref 150–400)
RBC: 2.33 MIL/uL — AB (ref 3.87–5.11)
RDW: 18.6 % — AB (ref 11.5–15.5)
WBC: 8.1 10*3/uL (ref 4.0–10.5)

## 2014-12-25 LAB — GLUCOSE, CAPILLARY
GLUCOSE-CAPILLARY: 104 mg/dL — AB (ref 65–99)
GLUCOSE-CAPILLARY: 110 mg/dL — AB (ref 65–99)
GLUCOSE-CAPILLARY: 124 mg/dL — AB (ref 65–99)
Glucose-Capillary: 119 mg/dL — ABNORMAL HIGH (ref 65–99)
Glucose-Capillary: 109 mg/dL — ABNORMAL HIGH (ref 65–99)
Glucose-Capillary: 108 mg/dL — ABNORMAL HIGH (ref 65–99)

## 2014-12-25 LAB — COMPREHENSIVE METABOLIC PANEL
ALBUMIN: 2 g/dL — AB (ref 3.5–5.0)
ALT: 8 U/L — AB (ref 14–54)
AST: 10 U/L — AB (ref 15–41)
Alkaline Phosphatase: 122 U/L (ref 38–126)
Anion gap: 9 (ref 5–15)
BUN: 36 mg/dL — AB (ref 6–20)
CHLORIDE: 100 mmol/L — AB (ref 101–111)
CO2: 25 mmol/L (ref 22–32)
CREATININE: 1.45 mg/dL — AB (ref 0.44–1.00)
Calcium: 7.1 mg/dL — ABNORMAL LOW (ref 8.9–10.3)
GFR calc Af Amer: 43 mL/min — ABNORMAL LOW (ref >60)
GFR calc non Af Amer: 37 mL/min — ABNORMAL LOW (ref >60)
GLUCOSE: 127 mg/dL — AB (ref 65–99)
POTASSIUM: 3.9 mmol/L (ref 3.5–5.1)
Sodium: 134 mmol/L — ABNORMAL LOW (ref 135–145)
Total Bilirubin: 0.4 mg/dL (ref 0.3–1.2)
Total Protein: 4.2 g/dL — ABNORMAL LOW (ref 6.5–8.1)

## 2014-12-25 LAB — TOBRAMYCIN LEVEL, RANDOM: Tobramycin Rm: 9.6 ug/mL

## 2014-12-25 MED ORDER — PHENYLEPHRINE HCL 10 MG/ML IJ SOLN
30.0000 ug/min | INTRAMUSCULAR | Status: DC
  Administered 2014-12-25: 30 ug/min via INTRAVENOUS
  Administered 2014-12-25 – 2014-12-26 (×2): 50 ug/min via INTRAVENOUS
  Administered 2014-12-27: 30 ug/min via INTRAVENOUS
  Filled 2014-12-25 (×3): qty 1

## 2014-12-25 MED ORDER — ENOXAPARIN SODIUM 30 MG/0.3ML ~~LOC~~ SOLN
30.0000 mg | SUBCUTANEOUS | Status: DC
  Administered 2014-12-25: 30 mg via SUBCUTANEOUS
  Filled 2014-12-25: qty 0.3

## 2014-12-25 NOTE — Progress Notes (Signed)
Nutrition Follow-up  INTERVENTION:   -Continue Vital AF 1.2 @ 50 mL/hr which provides 1440 kcal (96% kcal needs), 90 grams protein, and 973 mL free water - RD will continue to monitor for needs  NUTRITION DIAGNOSIS:   Inadequate oral intake related to inability to eat as evidenced by NPO status.  Ongoing.  GOAL:   Patient will meet greater than or equal to 90% of their needs  Meeting.  MONITOR:   Vent status, TF tolerance, Weight trends, Labs, I & O's  ASSESSMENT:   64 y.o. female with a past medical history of COPD, cigarette smoker, history of multiple pneumonias, history of C. difficile, history of partial colectomy and ileostomy, paroxysmal atrial fibrillation hypertension, chronic myelocytic monoclonal leukemia, chronic anemia that requires Epogen injections and occasional blood transfusions, MRSA carrier, GERD who comes to the emergency department due to worsening shortness of breath, pleuritic chest pain, fatigue, cold sweats for several days. She has also been having occasionally productive cough of mucus, decreased appetite, difficulty sleeping and dehydration due to high ileostomy output with decreased oral intake.   Pt tolerating TF at goal with no issues.  Patient is currently intubated on ventilator support MV: 10.1 L/min Temp (24hrs), Avg:98.9 F (37.2 C), Min:98.6 F (37 C), Max:99.1 F (37.3 C)  Propofol: none  Labs reviewed: CBGs: 104-124 Low Na Elevated BUN & Creatinine  Diet Order:  Diet NPO time specified  Skin:  Reviewed, no issues  Last BM:  9/8  Height:   Ht Readings from Last 1 Encounters:  12/21/2014 5\' 5"  (1.651 m)    Weight:   Wt Readings from Last 1 Encounters:  12/25/14 145 lb 15.1 oz (66.2 kg)    Ideal Body Weight:  56.82 kg (kg)  BMI:  Body mass index is 24.29 kg/(m^2).  Estimated Nutritional Needs:   Kcal:  1224  Protein:  90-100g  Fluid:  1.5L/day  EDUCATION NEEDS:   No education needs identified at this  time  Clayton Bibles, MS, RD, LDN Pager: [phone] After Hours Pager: [phone]

## 2014-12-25 NOTE — Progress Notes (Signed)
PULMONARY / CRITICAL CARE MEDICINE   Name: [name] MRN: [id number] DOB: 06/19/1950    ADMISSION DATE:  01/03/2015 CONSULTATION DATE:  9/06  REFERRING MD :  Dr. Karleen Hampshire   CHIEF COMPLAINT:  Shortness of breath   INITIAL PRESENTATION: [name], current occasional smoker (1-2 cigarettes per day) who was admitted on 9/5 with worsening shortness of breath.    STUDIES:  9/05  CT Chest w/o >> diffuse bilateral airspace disease, consolidation involving all lobes of L/R lung, several discrete nodules present in upper lobes, bulky mediastinal adenopathy  SIGNIFICANT EVENTS: 9/05  Admit with worsening SOB  9/6 intubated for hypoxia while receiving fluid bolus ? 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 Alvy Bimler, not on medications), chronic anemia / B12 deficiency on Epogen, suspected COPD (not defined with PFT's), and recurrent bouts of pneumonia who presented to [facility] on 9/5 with with worsening shortness of breath.    On presentation, the patient reported approximately 2 weeks of worsening shortness of breath and 4 days of chest pain.  Prior to admit, she was not on oxygen at home. She required no assistive devices for ADL's / walker etc.  She has limited activity tolerance but remained able to go out for lunch / meals without assist.  She is followed by Dr. Annamaria Boots for Pulmonary and has been worked for aspiration by GI given recurrent infiltrates which was negative.  However, there was concern for nocturnal aspiration.  In the past, she has had dyspnea associated with periods of worsening anemia.    Initial evaluation was notable for increased work of breathing and hypoxemia with saturations into the 70's.  She refused bipap on admit.  Labs Na 132, K 5.4,  CO2 18, BUN 38, sr cr 1.72, alk phos 140, albumin 2.7, AST 10, ALT 9, WBC 11.0, Hgb 9, MCV 102.5 and platelets 193.  The patient was admitted to SDU per [facility]. She was further evaluated with a CT of the chest without contrast which showed diffuse bilateral airspace disease, consolidation involving all lobes of L/R lung, several discrete nodules present in upper lobes, & bulky mediastinal adenopathy.  PCCM consulted for evaluation.    Last hospitalization was 11/2013 for dehydration, PAF & C-Diff.  Dr. Annamaria Boots assessed immunoglobulins in 03/2014 and were within normal limits.  She notes her sputum production is usually white and had not changed with this episode (quantity or color).  She denies fevers / chills, n/v.     SUBJECTIVE:  Afebrile Poor UO On lower pressors sedated  VITAL SIGNS: Temp:  [98.6 F (37 C)-99.1 F (37.3 C)] 98.9 F (37.2 C) (09/08 0400) Pulse Rate:  [47-126] 123 (09/08 0800) Resp:  [17-23] 23 (09/08 0800) BP: (79-156)/(31-105) 112/83 mmHg (09/08 0800) SpO2:  [96 %-100 %] 96 % (09/08 0800) FiO2 (%):  [40 %] 40 % (09/08 0748) Weight:  [145 lb 15.1 oz (66.2 kg)] 145 lb 15.1 oz (66.2 kg) (09/08 0500)   HEMODYNAMICS: CVP:  [6 mmHg-13 mmHg] 6 mmHg   VENTILATOR SETTINGS: Vent Mode:  [-] PRVC FiO2 (%):  [40 %] 40 % Set Rate:  [20 bmp] 20 bmp Vt Set:  [500 mL] 500 mL PEEP:  [5 cmH20] 5 cmH20 Plateau Pressure:  [17 cmH20-20 cmH20] 19 cmH20  INTAKE / OUTPUT:  Intake/Output Summary (Last 24 hours) at 12/25/14 0839 Last data filed at 12/25/14 0600  Gross per 24 hour  Intake 4547.01 ml  Output   1155 ml  Net 3392.01 ml    PHYSICAL EXAMINATION: General:  Thin, frail adult, intubated Neuro: RASS+1, MAE HEENT:  MM pink/dry, no jvd  Cardiovascular:  s1s2 rrr, gallop 2/6 SEM  Lungs:  resp's even/non-labored on [place] O2, crackles in LLL, coarse anterior Abdomen:  Ileostomy, mid-line old abd wound dressing c/d/i Musculoskeletal:  No acute deformities  Skin:   Warm/dry, no edema   LABS:  CBC  Recent Labs Lab 12/23/14 2150 12/24/14 0536 12/25/14 0412  WBC 15.3* 10.2 8.1  HGB 7.6* 7.4* 7.6*  HCT 23.6* 22.3* 23.0*  PLT 225 175 182   Coag's No results for input(s): APTT, INR in the last 168 hours.   BMET  Recent Labs Lab 12/23/14 2150 12/24/14 0536 12/25/14 0412  NA 129* 126* 134*  K 4.6 4.0 3.9  CL 98* 94* 100*  CO2 _0 BUN 37* 37* 36*  CREATININE 1.59* 1.55* 1.45*  GLUCOSE 294* 279* 127*   Electrolytes  Recent Labs Lab 01/02/2015 2006  12/23/14 0408 12/23/14 2150 12/24/14 0536 12/25/14 0412  CALCIUM  --   < >  --  6.1* 6.4* 7.1*  MG 0.9*  --  2.0  --   --   --   PHOS 5.4*  --   --   --   --   --   < > = values in this interval not displayed. Sepsis Markers  Recent Labs Lab 01/09/2015 1611 12/23/14 1830 12/23/14 2150 12/24/14 0536 12/25/14 0412  LATICACIDVEN 1.10 1.1  --   --   --   PROCALCITON  --   --  0.57 1.59 1.88   ABG  Recent Labs Lab 12/23/14 2110 12/24/14 0020 12/24/14 0427  PHART 7.037* 7.189* 7.282*  PCO2ART 66.6* 48.2* 39.5  PO2ART 166* 157* 138*   Liver Enzymes  Recent Labs Lab 12/23/14 0405 12/24/14 0536 12/25/14 0412  AST 10* 11* 10*  ALT 9* 8* 8*  ALKPHOS 137* 125 122  BILITOT 0.3 0.7 0.4  ALBUMIN 2.6* 2.0* 2.0*   Cardiac Enzymes  Recent Labs Lab 12/23/14 2150 12/24/14 0536  TROPONINI <0.03 <0.03     Glucose  Recent Labs Lab 12/24/14 2014 12/24/14 2332  GLUCAP 117* 104*    Imaging Dg Abd 1 View  12/24/2014   CLINICAL DATA:  Patient status post NG tube repositioning.  EXAM: ABDOMEN - 1 VIEW  COMPARISON:  Abdominal radiograph 12/14/2013  FINDINGS: NG tube side-port projects over the expected location of the GE junction. The tip projects within the stomach. Relative paucity of small bowel gas limits evaluation. Bilateral lower lung heterogeneous opacities and small to moderate right pleural effusion. Multilevel degenerative changes of the thoracic lumbar  spine. Multiple pelvic phleboliths. Left hip arthroplasty. Nonspecific radiodensity projecting over the right lung base.  IMPRESSION: NG tube side-port projects at the GE junction, consider advancement 1-2 cm.  These results will be called to the ordering clinician or representative by the Radiologist Assistant, and communication documented in the PACS or zVision Dashboard.   Electronically Signed   By: Lovey Newcomer M.D.   On: 12/24/2014 12:46   Dg Chest Port 1 View  12/25/2014   CLINICAL DATA:  Respiratory failure.  EXAM: PORTABLE CHEST - 1 VIEW  COMPARISON:  12/24/2014.  12/23/2014.  CT 12/28/2014 .  FINDINGS: Endotracheal tube,  and right PICC line in stable position. Interim advancement of NG tube, its tip is below the left hemidiaphragm. Stable mild mediastinal fullness consistent previously identified adenopathy . Nodular pulmonary infiltrates are again noted. Heart size normal. Diffuse bilateral pulmonary nodular pulmonary infiltrates again noted without change. No significant pleural effusion. No pneumothorax.  IMPRESSION: 1. Interim advancement of NG tube, its tip is below the left hemidiaphragm. Endotracheal tube and right PICC line stable position. 2. Stable diffuse nodular bilateral pulmonary infiltrates. Again differential diagnosis includes diffuse pneumonia, diffuse granulomas disease, and/or diffuse metastatic disease. No pleural effusion noted on today's exam. 3. Persistent mild mediastinal fullness consistent with previously identified adenopathy .   Electronically Signed   By: Marcello Moores  Register   On: 12/25/2014 07:07     ASSESSMENT / PLAN:  PULMONARY A:Acute hypoxemic respiratory failure in setting of pulmonary edema/volume overload Gram neg HCAP vs aspiration -Bilateral nodular Infiltrates - ? Of chronic silent / nocturnal aspiration, has had negative GI work up in the past, s/p bronch 9/7 Mediastinal Adenopathy  COPD - not defined with PFT's ,Tobacco Abuse  P:   Continue broad  spectrum abx Budesonide BID +Duoneb Q6 Smoking cessation  Aspiration precautions  Keep RR 24 or lower to avoid autoPEEP  CARDIOVASCULAR A:  PAF Hypertension  Septic shock  P:  Change to neo from Levo gtt to MAP goal 65  RENAL A:   Acute Kidney Injury - in setting of volume depletion / diarrhea  Gap/Non-Gap Acidosis - in setting of chronic GI loss + AKI Hyponatremia  Hyperkalemia  Hypomagnesemia   P:   Replace electrolytes as indicated  Ct  Bicarb gtt for NAG acidosis, related to GI loss.  Pt puts out approximately 1.5L from ileostomy daily   GASTROINTESTINAL A:   Diarrhea , C diff colonisation GERD P:   Ct TFs  HEMATOLOGIC A:   CMML Chronic Anemia  P:  Transfuse for Hb 7 or lower  INFECTIOUS A:   Diarrhea - chronic, 1.5L ileostomy output per day on average Bilateral Pulmonary Infiltrates P:   BCx2 9/5 >> Gram variable coccobacilli >> C-Diff 9/6 >> colonization BAL 9/7 >>  Zyvox, start date 9/5 >> Tressie Ellis, start date 9/5>> Tobra 9/7 >> 9/8 (stop since AKI & low UO)  Neuro - Sedation Fent gtt, RASS goal 0  Summary - BL nodular opacities -treat as HCAP, Gram neg sepsis await organism,  Acute hypoxic failure  The patient is critically ill with multiple organ systems failure and requires high complexity decision making for assessment and support, frequent evaluation and titration of therapies, application of advanced monitoring technologies and extensive interpretation of multiple databases. Critical Care Time devoted to patient care services described in this note independent of APP time is 35 minutes.   Kara Mead MD. Shade Flood. [place] [facility] Pager [phone] If no response call [phone]    12/25/2014, 8:39 AM

## 2014-12-26 ENCOUNTER — Inpatient Hospital Stay (HOSPITAL_COMMUNITY): Payer: 59

## 2014-12-26 LAB — BASIC METABOLIC PANEL
Anion gap: 10 (ref 5–15)
BUN: 39 mg/dL — ABNORMAL HIGH (ref 6–20)
CALCIUM: 7.2 mg/dL — AB (ref 8.9–10.3)
CO2: 32 mmol/L (ref 22–32)
CREATININE: 1.28 mg/dL — AB (ref 0.44–1.00)
Chloride: 93 mmol/L — ABNORMAL LOW (ref 101–111)
GFR calc Af Amer: 50 mL/min — ABNORMAL LOW (ref >60)
GFR calc non Af Amer: 43 mL/min — ABNORMAL LOW (ref >60)
GLUCOSE: 121 mg/dL — AB (ref 65–99)
Potassium: 3.7 mmol/L (ref 3.5–5.1)
Sodium: 135 mmol/L (ref 135–145)

## 2014-12-26 LAB — GLUCOSE, CAPILLARY
GLUCOSE-CAPILLARY: 104 mg/dL — AB (ref 65–99)
Glucose-Capillary: 105 mg/dL — ABNORMAL HIGH (ref 65–99)
Glucose-Capillary: 114 mg/dL — ABNORMAL HIGH (ref 65–99)
Glucose-Capillary: 118 mg/dL — ABNORMAL HIGH (ref 65–99)
Glucose-Capillary: 121 mg/dL — ABNORMAL HIGH (ref 65–99)
Glucose-Capillary: 137 mg/dL — ABNORMAL HIGH (ref 65–99)
Glucose-Capillary: 93 mg/dL (ref 65–99)

## 2014-12-26 LAB — BLOOD GAS, ARTERIAL
Acid-Base Excess: 8.9 mmol/L — ABNORMAL HIGH (ref 0.0–2.0)
Bicarbonate: 34 mEq/L — ABNORMAL HIGH (ref 20.0–24.0)
Drawn by: 295031
FIO2: 0.4
O2 SAT: 76.9 %
PCO2 ART: 54.1 mmHg — AB (ref 35.0–45.0)
PEEP/CPAP: 5 cmH2O
Patient temperature: 98.6
Pressure support: 10 cmH2O
TCO2: 32.7 mmol/L (ref 0–100)
pH, Arterial: 7.415 (ref 7.350–7.450)
pO2, Arterial: 49.8 mmHg — ABNORMAL LOW (ref 80.0–100.0)

## 2014-12-26 LAB — CBC
HCT: 22.4 % — ABNORMAL LOW (ref 36.0–46.0)
HEMOGLOBIN: 7.3 g/dL — AB (ref 12.0–15.0)
MCH: 32.4 pg (ref 26.0–34.0)
MCHC: 32.6 g/dL (ref 30.0–36.0)
MCV: 99.6 fL (ref 78.0–100.0)
PLATELETS: 149 10*3/uL — AB (ref 150–400)
RBC: 2.25 MIL/uL — ABNORMAL LOW (ref 3.87–5.11)
RDW: 18.8 % — ABNORMAL HIGH (ref 11.5–15.5)
WBC: 7.5 10*3/uL (ref 4.0–10.5)

## 2014-12-26 LAB — CULTURE, BAL-QUANTITATIVE W GRAM STAIN

## 2014-12-26 LAB — CULTURE, BAL-QUANTITATIVE
COLONY COUNT: NO GROWTH
CULTURE: NO GROWTH

## 2014-12-26 LAB — CULTURE, BLOOD (ROUTINE X 2)

## 2014-12-26 LAB — MAGNESIUM: MAGNESIUM: 1.2 mg/dL — AB (ref 1.7–2.4)

## 2014-12-26 LAB — PHOSPHORUS: Phosphorus: 3.6 mg/dL (ref 2.5–4.6)

## 2014-12-26 MED ORDER — ENOXAPARIN SODIUM 40 MG/0.4ML ~~LOC~~ SOLN
40.0000 mg | SUBCUTANEOUS | Status: DC
  Administered 2014-12-26 – 2014-12-27 (×2): 40 mg via SUBCUTANEOUS
  Filled 2014-12-26 (×2): qty 0.4

## 2014-12-26 MED ORDER — FUROSEMIDE 10 MG/ML IJ SOLN
40.0000 mg | Freq: Once | INTRAMUSCULAR | Status: AC
  Administered 2014-12-26: 40 mg via INTRAVENOUS
  Filled 2014-12-26: qty 4

## 2014-12-26 MED ORDER — MAGNESIUM SULFATE 4 GM/100ML IV SOLN
4.0000 g | Freq: Once | INTRAVENOUS | Status: AC
  Administered 2014-12-26: 4 g via INTRAVENOUS
  Filled 2014-12-26: qty 100

## 2014-12-26 MED ORDER — SODIUM CHLORIDE 0.9 % IV BOLUS (SEPSIS)
500.0000 mL | Freq: Once | INTRAVENOUS | Status: AC
  Administered 2014-12-26: 500 mL via INTRAVENOUS

## 2014-12-26 NOTE — Progress Notes (Signed)
Date:  Sept.9, 2016 U.R. performed for needs and level of care. Will continue to follow for Case Management needs.  [name], RN, BSN, [place]   [phone]

## 2014-12-26 NOTE — Progress Notes (Signed)
ANTIBIOTIC CONSULT NOTE - Follow up  Pharmacy Consult for Ceftazidime Indication: HCAP  Allergies  Allergen Reactions  . Vancomycin Hives and Rash    wheezing  . Ativan [Lorazepam] Other (See Comments)    "makes me crazy"  . Codeine Nausea And Vomiting  . Tetanus Toxoids Other (See Comments)    Serum reaction  . Penicillins Hives and Rash  . Xarelto [Rivaroxaban] Hives and Rash    (May not be allergic. Vancomycin was also being taken when reaction occurred.)    Patient Measurements: Height: 5\' 5"  (165.1 cm) Weight: 157 lb 6.5 oz (71.4 kg) IBW/kg (Calculated) : 57  Vital Signs: Temp: 99.2 F (37.3 C) (09/09 0800) Temp Source: Oral (09/09 0800) BP: 114/74 mmHg (09/09 0900) Pulse Rate: 117 (09/09 0900) Intake/Output from previous day: 09/08 0701 - 09/09 0700 In: 6013.1 [I.V.:3393.1; NG/GT:1620; IV Piggyback:1000] Out: 1650 [Urine:910; Stool:740]  Labs:  Recent Labs  12/24/14 0536 12/25/14 0412 12/26/14 0445  WBC 10.2 8.1 7.5  HGB 7.4* 7.6* 7.3*  PLT 175 182 149*  CREATININE 1.55* 1.45* 1.28*   Estimated Creatinine Clearance: 43.4 mL/min (by C-G formula based on Cr of 1.28).  Assessment: 64 year old female presents with increased shortness of breath over the last 4 days associated which chest pain. She's been treated multiple times for pneumonia with her primary care physician recently. She has been admitted to the hospital previously with ARDS and multifocal pneumonia.  Allergy to vancomycin with wheezing noted. Also allergic to PCNs, but has tolerated numerous cephalosporins.  ID ok with using Zyvox, as vanco and dapto are contraindicated.  Levofloxacin ordered x 1 in ED, but not given.  Pharmacy is consulted to dose ceftazidime  9/5 >> Ceftazidime >> 9/5 >> Zyvox (MD) >>    9/7 >> Tobramycin >> 9/8  9/5 blood: 1/2 with gram variable coccobacilli on stain; Culture = gram variable coccobacilli 9/5 C.diff Ag +, Toxin - 9/5 MRSA PCR: positive 9/5 Strep pneumo Ur  Ag: negative 9/7 BAL: NG  WBC WNL SCr - improving afebrile  Goal of Therapy:  Eradication of infection Appropriate antibiotic dosing for indication and renal function  Plan:  Day 5 antibiotics  Continue Ceftazidime 2g IV q12 hr - dose appropriate for improving renal function  Monitor for Zyvox ADEs - platelets trending down now barely < 150  Follow clinical course, renal function, culture results as available - awaiting final BC result  Follow for de-escalation of antibiotics and LOT  Doreene Eland, PharmD, BCPS.   Pager: [phone] 12/26/2014 9:21 AM

## 2014-12-26 NOTE — Progress Notes (Addendum)
PULMONARY / CRITICAL CARE MEDICINE   Name: [name] MRN: [id number] DOB: 1950/08/13    ADMISSION DATE:  01/03/2015 CONSULTATION DATE:  9/06  REFERRING MD :  Dr. Karleen Hampshire   CHIEF COMPLAINT:  Shortness of breath   INITIAL PRESENTATION: [name], current occasional smoker (1-2 cigarettes per day) who was admitted on 9/5 with worsening shortness of breath.    STUDIES:  9/05  CT Chest w/o >> diffuse bilateral airspace disease, consolidation involving all lobes of L/R lung, several discrete nodules present in upper lobes, bulky mediastinal adenopathy  SIGNIFICANT EVENTS: 9/05  Admit with worsening SOB  9/6 intubated for hypoxia while receiving fluid bolus ? 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 Alvy Bimler, not on medications), chronic anemia / B12 deficiency on Epogen, suspected COPD (not defined with PFT's), and recurrent bouts of pneumonia who presented to [facility] on 9/5 with with worsening shortness of breath.    On presentation, the patient reported approximately 2 weeks of worsening shortness of breath and 4 days of chest pain.  Prior to admit, she was not on oxygen at home. She required no assistive devices for ADL's / walker etc.  She has limited activity tolerance but remained able to go out for lunch / meals without assist.  She is followed by Dr. Annamaria Boots for Pulmonary and has been worked for aspiration by GI given recurrent infiltrates which was negative.  However, there was concern for nocturnal aspiration.  In the past, she has had dyspnea associated with periods of worsening anemia.    Initial evaluation was notable for increased work of breathing and hypoxemia with saturations into the 70's.  She refused bipap on admit.  Labs Na 132, K 5.4,  CO2 18, BUN 38, sr cr 1.72, alk phos 140, albumin 2.7, AST 10, ALT 9, WBC 11.0, Hgb 9, MCV 102.5 and platelets 193.  The patient was admitted to SDU per [facility]. She was further evaluated with a CT of the chest without contrast which showed diffuse bilateral airspace disease, consolidation involving all lobes of L/R lung, several discrete nodules present in upper lobes, & bulky mediastinal adenopathy.  PCCM consulted for evaluation.    Last hospitalization was 11/2013 for dehydration, PAF & C-Diff.  Dr. Annamaria Boots assessed immunoglobulins in 03/2014 and were within normal limits.  She notes her sputum production is usually white and had not changed with this episode (quantity or color).  She denies fevers / chills, n/v.     SUBJECTIVE:  Afebrile Poor UO Off pressors Anxious , off sedation  VITAL SIGNS: Temp:  [98.5 F (36.9 C)-99.7 F (37.6 C)] 98.5 F (36.9 C) (09/09 1200) Pulse Rate:  [103-130] 120 (09/09 1345) Resp:  [8-23] 23 (09/09 1345) BP: (63-142)/(37-106) 139/106 mmHg (09/09 1345) SpO2:  [93 %-100 %] 98 % (09/09 1446) FiO2 (%):  [40 %] 40 % (09/09 1446) Weight:  [157 lb 6.5 oz (71.4 kg)] 157 lb 6.5 oz (71.4 kg) (09/09 0454)   HEMODYNAMICS: CVP:  [7 mmHg-13 mmHg] 13 mmHg   VENTILATOR SETTINGS: Vent Mode:  [-] PRVC FiO2 (%):  [40 %] 40 % Set Rate:  [20 bmp] 20 bmp Vt Set:  [500 mL] 500 mL PEEP:  [5 cmH20] 5 cmH20 Pressure Support:  [10 [place]  Pressure:  [17 cmH20-20 cmH20] 20 cmH20   INTAKE / OUTPUT:  Intake/Output Summary (Last 24 hours) at 12/26/14 1539 Last data filed at 12/26/14 1300  Gross per 24 hour  Intake 5256.96 ml  Output    960 ml  Net 4296.96 ml    PHYSICAL EXAMINATION: General:  Thin, frail adult, intubated Neuro: RASS+1, MAE HEENT:  MM pink/dry, no jvd  Cardiovascular:  s1s2 rrr, gallop 2/6 SEM  Lungs:  resp's even/non-labored on [place] O2, crackles in LLL, coarse anterior Abdomen:  Ileostomy, mid-line old abd wound dressing  c/d/i Musculoskeletal:  No acute deformities  Skin:  Warm/dry, no edema   LABS:  CBC  Recent Labs Lab 12/24/14 0536 12/25/14 0412 12/26/14 0445  WBC 10.2 8.1 7.5  HGB 7.4* 7.6* 7.3*  HCT 22.3* 23.0* 22.4*  PLT 175 182 149*   Coag's No results for input(s): APTT, INR in the last 168 hours.   BMET  Recent Labs Lab 12/24/14 0536 12/25/14 0412 12/26/14 0445  NA 126* 134* 135  K 4.0 3.9 3.7  CL 94* 100* 93*  CO2 24 25 32  BUN 37* 36* 39*  CREATININE 1.55* 1.45* 1.28*  GLUCOSE 279* 127* 121*   Electrolytes  Recent Labs Lab 12/29/2014 2006  12/23/14 0408  12/24/14 0536 12/25/14 0412 12/26/14 0445  CALCIUM  --   < >  --   < > 6.4* 7.1* 7.2*  MG 0.9*  --  2.0  --   --   --  1.2*  PHOS 5.4*  --   --   --   --   --  3.6  < > = values in this interval not displayed. Sepsis Markers  Recent Labs Lab 01/08/2015 1611 12/23/14 1830 12/23/14 2150 12/24/14 0536 12/25/14 0412  LATICACIDVEN 1.10 1.1  --   --   --   PROCALCITON  --   --  0.57 1.59 1.88   ABG  Recent Labs Lab 12/24/14 0020 12/24/14 0427 12/26/14 1100  PHART 7.189* 7.282* 7.415  PCO2ART 48.2* 39.5 54.1*  PO2ART 157* 138* 49.8*   Liver Enzymes  Recent Labs Lab 12/23/14 0405 12/24/14 0536 12/25/14 0412  AST 10* 11* 10*  ALT 9* 8* 8*  ALKPHOS 137* 125 122  BILITOT 0.3 0.7 0.4  ALBUMIN 2.6* 2.0* 2.0*   Cardiac Enzymes  Recent Labs Lab 12/23/14 2150 12/24/14 0536  TROPONINI <0.03 <0.03     Glucose  Recent Labs Lab 12/25/14 1606 12/25/14 1942 12/25/14 2337 12/26/14 0348 12/26/14 0813 12/26/14 1146  GLUCAP 108* 110* 114* 121* 118* 137*    Imaging Dg Chest Port 1 View  12/26/2014   CLINICAL DATA:  Acute respiratory failure  EXAM: PORTABLE CHEST - 1 VIEW  COMPARISON:  12/25/2014  FINDINGS: Endotracheal tube in good position. NG tube in the stomach. Central venous catheter tip in the SVC. No pneumothorax.  Extensive bilateral airspace disease. Small right pleural effusion has  developed in the interval. Progression of right lower lobe airspace disease since the prior study.  IMPRESSION: Support lines remain in good position.  Extensive bilateral airspace disease. Progressive airspace disease right lower lobe and right pleural effusion compared with the prior study.   Electronically Signed   By: Franchot Gallo M.D.   On: 12/26/2014 07:25     ASSESSMENT / PLAN:  PULMONARY A:Acute hypoxemic respiratory failure in setting of pulmonary edema/volume overload HCAP vs aspiration -Bilateral nodular Infiltrates - ? Of chronic silent / nocturnal aspiration, has had negative GI work up  in the past, s/p bronch 9/7 Mediastinal Adenopathy  COPD - not defined with PFT's ,Tobacco Abuse  P:   Continue broad spectrum abx Budesonide BID +Duoneb Q6 Smoking cessation  Aspiration precautions eventual  Keep RR 24 or lower to avoid autoPEEP, Weaned on PS but unfavorable ABg & WOB to extubate  CARDIOVASCULAR A:  PAF -RVR Hypertension  Septic shock  P:  Off pressors (neo )  RENAL A:   Acute Kidney Injury - in setting of volume depletion / diarrhea  Gap/Non-Gap Acidosis - in setting of chronic GI loss + AKI -Pt puts out approximately 1.5L from ileostomy daily  Hyponatremia  Hyperkalemia  Hypomagnesemia   P:   Replace electrolytes as indicated  Dc  Bicarb gtt Lasix 20 today - increase as toelrated  GASTROINTESTINAL A:   Diarrhea , C diff colonisation GERD P:   Ct TFs  HEMATOLOGIC A:   CMML Chronic Anemia  P:  Transfuse for Hb 7 or lower  INFECTIOUS A:   Diarrhea - chronic, 1.5L ileostomy output per day on average Bilateral Pulmonary Infiltrates P:   BCx2 9/5 >> strep viridans  (? Contaminant vs real) C-Diff 9/6 >> colonization BAL 9/7 >> ng   Zyvox, start date 9/5 >>9/9 Tressie Ellis, start date 9/5>>  Tobra 9/7 >> 9/8 (stop since AKI & low UO)   Neuro - Sedation Fent gtt, RASS goal 0, versed prn   Summary - Strep pneumonia & septic shock  -  Acute hypoxic  failure, weaning , hope to extubate soon  The patient is critically ill with multiple organ systems failure and requires high complexity decision making for assessment and support, frequent evaluation and titration of therapies, application of advanced monitoring technologies and extensive interpretation of multiple databases. Critical Care Time devoted to patient care services described in this note independent of APP time is 35 minutes.   Kara Mead MD. [place] Flood. [facility] Pager [phone] If no response call [phone]    12/26/2014, 3:39 PM

## 2014-12-26 NOTE — Progress Notes (Signed)
Pt in Atrial fluttter, HR=170's. Pt is agitated and shaking. Notified Rainier Md. Regarding pt's status. See new orders.

## 2014-12-26 NOTE — Progress Notes (Signed)
eLink Physician-Brief Progress Note Patient Name: [name] DOB: 1950-07-18 MRN: [id number]   Date of Service  12/26/2014  HPI/Events of Note  RN notified pt now in SVT. A fib/flutter on tele. Currently on Neo gtt. Has h/o A fib/flutter.  eICU Interventions  Stat NS 500cc bolus, Mag, BMP, & EKG.     Intervention Category Major Interventions: Arrhythmia - evaluation and management  Tera Partridge 12/26/2014, 11:43 PM

## 2014-12-27 ENCOUNTER — Inpatient Hospital Stay (HOSPITAL_COMMUNITY): Payer: 59

## 2014-12-27 DIAGNOSIS — J9601 Acute respiratory failure with hypoxia: Secondary | ICD-10-CM

## 2014-12-27 DIAGNOSIS — J189 Pneumonia, unspecified organism: Secondary | ICD-10-CM

## 2014-12-27 LAB — MAGNESIUM
MAGNESIUM: 1.9 mg/dL (ref 1.7–2.4)
MAGNESIUM: 2.1 mg/dL (ref 1.7–2.4)

## 2014-12-27 LAB — BASIC METABOLIC PANEL
ANION GAP: 7 (ref 5–15)
ANION GAP: 7 (ref 5–15)
BUN: 40 mg/dL — AB (ref 6–20)
BUN: 44 mg/dL — ABNORMAL HIGH (ref 6–20)
CALCIUM: 7.4 mg/dL — AB (ref 8.9–10.3)
CALCIUM: 7.5 mg/dL — AB (ref 8.9–10.3)
CO2: 34 mmol/L — ABNORMAL HIGH (ref 22–32)
CO2: 35 mmol/L — ABNORMAL HIGH (ref 22–32)
Chloride: 91 mmol/L — ABNORMAL LOW (ref 101–111)
Chloride: 92 mmol/L — ABNORMAL LOW (ref 101–111)
Creatinine, Ser: 1.12 mg/dL — ABNORMAL HIGH (ref 0.44–1.00)
Creatinine, Ser: 1.23 mg/dL — ABNORMAL HIGH (ref 0.44–1.00)
GFR calc Af Amer: 52 mL/min — ABNORMAL LOW (ref >60)
GFR calc Af Amer: 58 mL/min — ABNORMAL LOW (ref >60)
GFR, EST NON AFRICAN AMERICAN: 45 mL/min — AB (ref >60)
GFR, EST NON AFRICAN AMERICAN: 50 mL/min — AB (ref >60)
Glucose, Bld: 129 mg/dL — ABNORMAL HIGH (ref 65–99)
Glucose, Bld: 193 mg/dL — ABNORMAL HIGH (ref 65–99)
POTASSIUM: 3.7 mmol/L (ref 3.5–5.1)
POTASSIUM: 3.8 mmol/L (ref 3.5–5.1)
SODIUM: 132 mmol/L — AB (ref 135–145)
SODIUM: 134 mmol/L — AB (ref 135–145)

## 2014-12-27 LAB — PHOSPHORUS: PHOSPHORUS: 3.4 mg/dL (ref 2.5–4.6)

## 2014-12-27 LAB — CBC
HEMATOCRIT: 21.9 % — AB (ref 36.0–46.0)
HEMOGLOBIN: 7 g/dL — AB (ref 12.0–15.0)
MCH: 32.7 pg (ref 26.0–34.0)
MCHC: 32 g/dL (ref 30.0–36.0)
MCV: 102.3 fL — ABNORMAL HIGH (ref 78.0–100.0)
Platelets: 145 10*3/uL — ABNORMAL LOW (ref 150–400)
RBC: 2.14 MIL/uL — ABNORMAL LOW (ref 3.87–5.11)
RDW: 18.6 % — AB (ref 11.5–15.5)
WBC: 10.4 10*3/uL (ref 4.0–10.5)

## 2014-12-27 LAB — CBC WITH DIFFERENTIAL/PLATELET
BASOS ABS: 0 10*3/uL (ref 0.0–0.1)
Basophils Relative: 0 % (ref 0–1)
EOS ABS: 0 10*3/uL (ref 0.0–0.7)
EOS PCT: 0 % (ref 0–5)
HCT: 26.1 % — ABNORMAL LOW (ref 36.0–46.0)
Hemoglobin: 8.2 g/dL — ABNORMAL LOW (ref 12.0–15.0)
LYMPHS PCT: 26 % (ref 12–46)
Lymphs Abs: 2.3 10*3/uL (ref 0.7–4.0)
MCH: 31.8 pg (ref 26.0–34.0)
MCHC: 31.4 g/dL (ref 30.0–36.0)
MCV: 101.2 fL — AB (ref 78.0–100.0)
MONO ABS: 2.6 10*3/uL — AB (ref 0.1–1.0)
Monocytes Relative: 29 % — ABNORMAL HIGH (ref 3–12)
Neutro Abs: 3.9 10*3/uL (ref 1.7–7.7)
Neutrophils Relative %: 45 % (ref 43–77)
PLATELETS: 139 10*3/uL — AB (ref 150–400)
RBC: 2.58 MIL/uL — AB (ref 3.87–5.11)
RDW: 17.6 % — AB (ref 11.5–15.5)
WBC: 8.8 10*3/uL (ref 4.0–10.5)

## 2014-12-27 LAB — GLUCOSE, CAPILLARY
GLUCOSE-CAPILLARY: 127 mg/dL — AB (ref 65–99)
GLUCOSE-CAPILLARY: 118 mg/dL — AB (ref 65–99)
GLUCOSE-CAPILLARY: 105 mg/dL — AB (ref 65–99)
Glucose-Capillary: 148 mg/dL — ABNORMAL HIGH (ref 65–99)
Glucose-Capillary: 131 mg/dL — ABNORMAL HIGH (ref 65–99)

## 2014-12-27 LAB — PREPARE RBC (CROSSMATCH)

## 2014-12-27 MED ORDER — FENTANYL CITRATE (PF) 100 MCG/2ML IJ SOLN
50.0000 ug | INTRAMUSCULAR | Status: DC | PRN
  Administered 2014-12-27 – 2015-01-05 (×35): 50 ug via INTRAVENOUS
  Filled 2014-12-27 (×32): qty 2

## 2014-12-27 MED ORDER — OXYCODONE-ACETAMINOPHEN 5-325 MG PO TABS
1.0000 | ORAL_TABLET | Freq: Four times a day (QID) | ORAL | Status: DC | PRN
  Administered 2014-12-27: 1 via ORAL
  Filled 2014-12-27: qty 1

## 2014-12-27 MED ORDER — FAMOTIDINE 20 MG PO TABS
20.0000 mg | ORAL_TABLET | Freq: Every day | ORAL | Status: DC
  Administered 2014-12-27: 20 mg via ORAL
  Filled 2014-12-27: qty 1

## 2014-12-27 MED ORDER — DILTIAZEM HCL 100 MG IV SOLR
5.0000 mg/h | INTRAVENOUS | Status: DC
  Administered 2014-12-27: 5 mg/h via INTRAVENOUS
  Filled 2014-12-27 (×3): qty 100

## 2014-12-27 MED ORDER — ACETAMINOPHEN 325 MG PO TABS: 650.0000 mg | ORAL_TABLET | Freq: Four times a day (QID) | ORAL | Status: DC | PRN

## 2014-12-27 MED ORDER — DILTIAZEM LOAD VIA INFUSION
15.0000 mg | Freq: Once | INTRAVENOUS | Status: AC
  Administered 2014-12-27: 15 mg via INTRAVENOUS
  Filled 2014-12-27: qty 15

## 2014-12-27 MED ORDER — FUROSEMIDE 10 MG/ML IJ SOLN
40.0000 mg | Freq: Once | INTRAMUSCULAR | Status: AC
  Administered 2014-12-27: 40 mg via INTRAVENOUS
  Filled 2014-12-27: qty 4

## 2014-12-27 MED ORDER — SODIUM CHLORIDE 0.9 % IV SOLN
Freq: Once | INTRAVENOUS | Status: AC
  Administered 2014-12-27: 10 mL/h via INTRAVENOUS

## 2014-12-27 MED ORDER — POTASSIUM CHLORIDE 10 MEQ/50ML IV SOLN
10.0000 meq | INTRAVENOUS | Status: AC
  Administered 2014-12-27 (×2): 10 meq via INTRAVENOUS
  Filled 2014-12-27 (×2): qty 50

## 2014-12-27 MED ORDER — SODIUM CHLORIDE 0.9 % IV SOLN
1.0000 g | Freq: Once | INTRAVENOUS | Status: AC
  Administered 2014-12-27: 1 g via INTRAVENOUS
  Filled 2014-12-27: qty 10

## 2014-12-27 NOTE — Progress Notes (Signed)
Patient is complaining of shob.  Notified Dr. Halford Chessman, on unit.  Instructed to have RT place patient on Bipap.

## 2014-12-27 NOTE — Progress Notes (Signed)
Cuff deflated, air leak test positive, patient suctioned then extubated to a 6lpm [place]. RR 22, HR123,O2 sat 92%. Will continue to monitor. Patient verbalizing well

## 2014-12-27 NOTE — Progress Notes (Signed)
IV fentanyl 240 ml wasted.  Witness Barnetta Chapel, RN.

## 2014-12-27 NOTE — Progress Notes (Signed)
eLink Physician-Brief Progress Note Patient Name: [name] DOB: June 15, 1950 MRN: [id number]   Date of Service  12/27/2014  HPI/Events of Note  Persistent A fib w/ RVR post bolus. K 3.8. Mag >2.0.  eICU Interventions  KCl 66mEq IV. 1amp Ca gluconate>15mg  Dilt IV>Dilt gtt     Intervention Category Major Interventions: Arrhythmia - evaluation and management  Tera Partridge 12/27/2014, 12:47 AM

## 2014-12-27 NOTE — Progress Notes (Signed)
PULMONARY / CRITICAL CARE MEDICINE   Name: [name] MRN: [id number] DOB: [birth year]    ADMISSION DATE:  01/13/2015 CONSULTATION DATE:  9/06  REFERRING MD :  Dr. Karleen Hampshire   CHIEF COMPLAINT:  Shortness of breath   INITIAL PRESENTATION:  64 yo female smoker with dyspnea, VDRF from pneumococcal PNA and septic shock.  She has hx of HTN, GERD, PAF, CMML, COPD.  STUDIES:  9/05  CT Chest w/o >> diffuse bilateral airspace disease, consolidation involving all lobes of L/R lung, several discrete nodules present in upper lobes, bulky mediastinal adenopathy  SIGNIFICANT EVENTS: 9/05  Admit with worsening SOB  9/06 intubated for hypoxia while receiving fluid bolus ? Edema, levo gtt 9/10 Extubated  SUBJECTIVE:  Tolerating pressure support.  VITAL SIGNS: Temp:  [97.6 F (36.4 C)-98.7 F (37.1 C)] 98.5 F (36.9 C) (09/10 0800) Pulse Rate:  [104-120] 111 (09/10 1000) Resp:  [14-23] 14 (09/10 1000) BP: (70-139)/(20-106) 127/62 mmHg (09/10 1000) SpO2:  [95 %-100 %] 97 % (09/10 1000) FiO2 (%):  [30 %-40 %] 40 % (09/10 0916) Weight:  [164 lb 3.9 oz (74.5 kg)] 164 lb 3.9 oz (74.5 kg) (09/10 0430)   HEMODYNAMICS: CVP:  [11 mmHg-12 mmHg] 12 mmHg   VENTILATOR SETTINGS: Vent Mode:  [-] PSV;CPAP FiO2 (%):  [30 %-40 %] 40 % Set Rate:  [20 bmp] 20 bmp Vt Set:  [500 mL] 500 mL PEEP:  [5 cmH20] 5 cmH20 Pressure Support:  [5 cmH20] 5 cmH20 Plateau Pressure:  [19 cmH20-26 cmH20] 20 cmH20   INTAKE / OUTPUT:  Intake/Output Summary (Last 24 hours) at 12/27/14 1123 Last data filed at 12/27/14 1031  Gross per 24 hour  Intake 2546.05 ml  Output   2015 ml  Net 531.05 ml    PHYSICAL EXAMINATION: General: anxious to get tubes out Neuro: follows commands HEENT:  ETT in place Cardiovascular:  Regular, 2/6 SM  Lungs: basilar crackles Abdomen:  Ileostomy, mid-line old abd wound dressing c/d/i Musculoskeletal: no edema Skin: no rash   LABS:  CBC  Recent Labs Lab 12/25/14 0412  12/26/14 0445 12/27/14 0420  WBC 8.1 7.5 10.4  HGB 7.6* 7.3* 7.0*  HCT 23.0* 22.4* 21.9*  PLT 182 149* 145*   BMET  Recent Labs Lab 12/26/14 0445 12/26/14 2353 12/27/14 0420  NA 135 134* 132*  K 3.7 3.8 3.7  CL 93* 92* 91*  CO2 32 35* 34*  BUN 39* 40* 44*  CREATININE 1.28* 1.23* 1.12*  GLUCOSE 121* 129* 193*   Electrolytes  Recent Labs Lab 01/11/2015 2006  12/26/14 0445 12/26/14 2353 12/27/14 0420  CALCIUM  --   < > 7.2* 7.4* 7.5*  MG 0.9*  < > 1.2* 2.1 1.9  PHOS 5.4*  --  3.6  --  3.4  < > = values in this interval not displayed.   Sepsis Markers  Recent Labs Lab 12/21/2014 1611 12/23/14 1830 12/23/14 2150 12/24/14 0536 12/25/14 0412  LATICACIDVEN 1.10 1.1  --   --   --   PROCALCITON  --   --  0.57 1.59 1.88   ABG  Recent Labs Lab 12/24/14 0020 12/24/14 0427 12/26/14 1100  PHART 7.189* 7.282* 7.415  PCO2ART 48.2* 39.5 54.1*  PO2ART 157* 138* 49.8*   Liver Enzymes  Recent Labs Lab 12/23/14 0405 12/24/14 0536 12/25/14 0412  AST 10* 11* 10*  ALT 9* 8* 8*  ALKPHOS 137* 125 122  BILITOT 0.3 0.7 0.4  ALBUMIN 2.6* 2.0* 2.0*   Cardiac Enzymes  Recent  Labs Lab 12/23/14 2150 12/24/14 0536  TROPONINI <0.03 <0.03     Glucose  Recent Labs Lab 12/26/14 1146 12/26/14 1542 12/26/14 2018 12/26/14 2321 12/27/14 0350 12/27/14 0857  GLUCAP 137* 105* 104* 93 148* 127*    Imaging Dg Chest Port 1 View  12/27/2014   CLINICAL DATA:  Acute respiratory failure with hypoxemia.  EXAM: PORTABLE CHEST - 1 VIEW  COMPARISON:  12/26/2014.  FINDINGS: The cardiomediastinal silhouette remains stable. Support tubes and lines unchanged. Extensive BILATERAL airspace disease is observed without significant improvement. RIGHT pleural effusion may be increased.  IMPRESSION: Persistent BILATERAL pneumonia, with increasing RIGHT pleural effusion.   Electronically Signed   By: Staci Righter M.D.   On: 12/27/2014 09:42     ASSESSMENT / PLAN:  PULMONARY ETT 9/06 >>  9/10 A: Acute hypoxic respiratory failure 2nd to Streptococcal PNA and pulmonary edema. B/l nodular infiltrates with mediastinal LAN s/p bronch 9/07. Tobacco abuse with presumed COPD. P:   Proceed with extubation 9/10  F/u CXR intermittently Scheduled duoneb and budesonide Oxygen to keep SpO2 > 92%  CARDIOVASCULAR A:  A fib with RR >> back in sinus rhythm 9/10. Septic shock 2nd to Streptococcal PNA >> resolved. Hx of hypertension. P:  Wean off cardizem to keep HR < 120 Continue ASA  RENAL A:   AKI 2nd to sepsis, hypovolemia >> resolved. Metabolic acidosis >> resolved. Hypervolemia. P:   Lasix 40 mg IV x one 9/10 after transfusion  GASTROINTESTINAL A:   Chronic diarrhea with C diff colonization. Hx of GERD. Nutrition. P:   Advance diet after extubation Zantac for SUP  HEMATOLOGIC A:   Hx of CMML with chronic anemia. P:  Transfuse 1 unit PRBC 9/10 for Hb 7 Lovenox for DVT prevention  INFECTIOUS A:   Septic shock 2nd to Streptococcal pneumonia. P:   Day 6 of Abx, currently on fortaz  BCx2 9/5 >> strep viridans  (? Contaminant vs real) C-Diff 9/6 >> colonization  ENDOCRINE A: Hyperglycemia. P: Monitor blood sugars  NEUROLOGY A: Chronic pain. P: Prn fentanyl  Updated family at bedside.  CC time 35 minutes.  Chesley Mires, MD [facility] Pulmonary/Critical Care 12/27/2014, 11:35 AM Pager:  [phone] After 3pm call: [phone]

## 2014-12-28 ENCOUNTER — Inpatient Hospital Stay (HOSPITAL_COMMUNITY): Payer: 59

## 2014-12-28 DIAGNOSIS — J9601 Acute respiratory failure with hypoxia: Secondary | ICD-10-CM

## 2014-12-28 DIAGNOSIS — A419 Sepsis, unspecified organism: Secondary | ICD-10-CM

## 2014-12-28 LAB — CULTURE, BLOOD (ROUTINE X 2): Culture: NO GROWTH

## 2014-12-28 LAB — BASIC METABOLIC PANEL
ANION GAP: 12 (ref 5–15)
BUN: 45 mg/dL — ABNORMAL HIGH (ref 6–20)
CALCIUM: 8.4 mg/dL — AB (ref 8.9–10.3)
CO2: 32 mmol/L (ref 22–32)
CREATININE: 1.25 mg/dL — AB (ref 0.44–1.00)
Chloride: 93 mmol/L — ABNORMAL LOW (ref 101–111)
GFR, EST AFRICAN AMERICAN: 51 mL/min — AB (ref >60)
GFR, EST NON AFRICAN AMERICAN: 44 mL/min — AB (ref >60)
GLUCOSE: 118 mg/dL — AB (ref 65–99)
Potassium: 4 mmol/L (ref 3.5–5.1)
Sodium: 137 mmol/L (ref 135–145)

## 2014-12-28 LAB — GLUCOSE, CAPILLARY
GLUCOSE-CAPILLARY: 85 mg/dL (ref 65–99)
GLUCOSE-CAPILLARY: 81 mg/dL (ref 65–99)
Glucose-Capillary: 92 mg/dL (ref 65–99)

## 2014-12-28 LAB — BLOOD GAS, ARTERIAL
Acid-Base Excess: 4.7 mmol/L — ABNORMAL HIGH (ref 0.0–2.0)
Bicarbonate: 29 meq/L — ABNORMAL HIGH (ref 20.0–24.0)
FIO2: 1
MECHVT: 500 mL
O2 Saturation: 93.8 %
PEEP: 5 cmH2O
Patient temperature: 97.8
RATE: 28 {breaths}/min
TCO2: 27 mmol/L (ref 0–100)
pCO2 arterial: 42.9 mmHg (ref 35.0–45.0)
pH, Arterial: 7.442 (ref 7.350–7.450)
pO2, Arterial: 76 mmHg — ABNORMAL LOW (ref 80.0–100.0)

## 2014-12-28 LAB — CBC
HCT: 30.2 % — ABNORMAL LOW (ref 36.0–46.0)
Hemoglobin: 9.7 g/dL — ABNORMAL LOW (ref 12.0–15.0)
MCH: 32.4 pg (ref 26.0–34.0)
MCHC: 32.1 g/dL (ref 30.0–36.0)
MCV: 101 fL — AB (ref 78.0–100.0)
PLATELETS: 182 10*3/uL (ref 150–400)
RBC: 2.99 MIL/uL — ABNORMAL LOW (ref 3.87–5.11)
RDW: 18.5 % — AB (ref 11.5–15.5)
WBC: 15.6 10*3/uL — AB (ref 4.0–10.5)

## 2014-12-28 LAB — TROPONIN I
TROPONIN I: 0.03 ng/mL (ref <0.031)
Troponin I: <0.03 ng/mL (ref <0.031)
Troponin I: 0.03 ng/mL (ref <0.031)

## 2014-12-28 LAB — TRIGLYCERIDES: Triglycerides: 216 mg/dL — ABNORMAL HIGH (ref <150)

## 2014-12-28 LAB — MAGNESIUM: Magnesium: 1.7 mg/dL (ref 1.7–2.4)

## 2014-12-28 LAB — HEPARIN LEVEL (UNFRACTIONATED): Heparin Unfractionated: 0.59 IU/mL (ref 0.30–0.70)

## 2014-12-28 MED ORDER — HEPARIN (PORCINE) IN NACL 100-0.45 UNIT/ML-% IJ SOLN
1000.0000 [IU]/h | INTRAMUSCULAR | Status: DC
  Administered 2014-12-28 – 2014-12-30 (×3): 1000 [IU]/h via INTRAVENOUS
  Filled 2014-12-28 (×6): qty 250

## 2014-12-28 MED ORDER — FENTANYL CITRATE (PF) 100 MCG/2ML IJ SOLN
INTRAMUSCULAR | Status: AC
  Filled 2014-12-28: qty 2

## 2014-12-28 MED ORDER — LIDOCAINE HCL (CARDIAC) 20 MG/ML IV SOLN
INTRAVENOUS | Status: AC
Start: 2014-12-28 — End: 2014-12-28
  Filled 2014-12-28: qty 5

## 2014-12-28 MED ORDER — MIDAZOLAM HCL 2 MG/2ML IJ SOLN
INTRAMUSCULAR | Status: AC
  Filled 2014-12-28: qty 2

## 2014-12-28 MED ORDER — FUROSEMIDE 10 MG/ML IJ SOLN
INTRAMUSCULAR | Status: AC
  Filled 2014-12-28: qty 4

## 2014-12-28 MED ORDER — ETOMIDATE 2 MG/ML IV SOLN
INTRAVENOUS | Status: AC
  Administered 2014-12-28: 10 mg
  Filled 2014-12-28: qty 20

## 2014-12-28 MED ORDER — OXYCODONE-ACETAMINOPHEN 5-325 MG PO TABS
1.0000 | ORAL_TABLET | Freq: Four times a day (QID) | ORAL | Status: DC | PRN
  Administered 2015-01-01: 1
  Filled 2014-12-28: qty 1

## 2014-12-28 MED ORDER — DIAZEPAM 5 MG/ML IJ SOLN
5.0000 mg | Freq: Once | INTRAMUSCULAR | Status: AC
  Administered 2014-12-28: 5 mg via INTRAVENOUS

## 2014-12-28 MED ORDER — DIAZEPAM 5 MG/ML IJ SOLN
INTRAMUSCULAR | Status: AC
Start: 2014-12-28 — End: 2014-12-28
  Filled 2014-12-28: qty 2

## 2014-12-28 MED ORDER — SUCCINYLCHOLINE CHLORIDE 20 MG/ML IJ SOLN
INTRAMUSCULAR | Status: AC
  Filled 2014-12-28: qty 1

## 2014-12-28 MED ORDER — PHENYLEPHRINE HCL 10 MG/ML IJ SOLN
0.0000 ug/min | Freq: Once | INTRAVENOUS | Status: DC
  Administered 2014-12-28: 50 ug/min via INTRAVENOUS
  Filled 2014-12-28: qty 1

## 2014-12-28 MED ORDER — MIDAZOLAM HCL 2 MG/2ML IJ SOLN
INTRAMUSCULAR | Status: AC
  Administered 2014-12-28: 2 mg
  Filled 2014-12-28: qty 2

## 2014-12-28 MED ORDER — ROCURONIUM BROMIDE 50 MG/5ML IV SOLN
INTRAVENOUS | Status: AC
  Administered 2014-12-28: 30 mg
  Filled 2014-12-28: qty 2

## 2014-12-28 MED ORDER — ACETAMINOPHEN 160 MG/5ML PO SOLN: 650.0000 mg | Freq: Four times a day (QID) | ORAL | Status: DC | PRN

## 2014-12-28 MED ORDER — PROPOFOL 1000 MG/100ML IV EMUL
INTRAVENOUS | Status: AC
  Filled 2014-12-28: qty 100

## 2014-12-28 MED ORDER — ASPIRIN 325 MG PO TABS
325.0000 mg | ORAL_TABLET | Freq: Every morning | ORAL | Status: DC
  Administered 2014-12-29 – 2015-01-02 (×5): 325 mg
  Filled 2014-12-28 (×6): qty 1

## 2014-12-28 MED ORDER — MIDAZOLAM HCL 2 MG/2ML IJ SOLN
INTRAMUSCULAR | Status: AC
  Administered 2014-12-28: 4 mg
  Filled 2014-12-28: qty 2

## 2014-12-28 MED ORDER — ALBUTEROL SULFATE (2.5 MG/3ML) 0.083% IN NEBU
INHALATION_SOLUTION | RESPIRATORY_TRACT | Status: AC
  Filled 2014-12-28: qty 3

## 2014-12-28 MED ORDER — DOCUSATE SODIUM 50 MG/5ML PO LIQD: 100.0000 mg | Freq: Two times a day (BID) | ORAL | Status: DC | PRN

## 2014-12-28 MED ORDER — AMIODARONE HCL IN DEXTROSE 360-4.14 MG/200ML-% IV SOLN
60.0000 mg/h | INTRAVENOUS | Status: AC
  Administered 2014-12-28 (×2): 60 mg/h via INTRAVENOUS
  Filled 2014-12-28 (×2): qty 200

## 2014-12-28 MED ORDER — METOPROLOL TARTRATE 1 MG/ML IV SOLN
5.0000 mg | INTRAVENOUS | Status: DC | PRN
  Administered 2015-01-04: 5 mg via INTRAVENOUS
  Filled 2014-12-28 (×2): qty 5

## 2014-12-28 MED ORDER — FAMOTIDINE 20 MG PO TABS
20.0000 mg | ORAL_TABLET | Freq: Every day | ORAL | Status: DC
  Administered 2014-12-28 – 2014-12-31 (×4): 20 mg
  Filled 2014-12-28 (×4): qty 1

## 2014-12-28 MED ORDER — VITAL HIGH PROTEIN PO LIQD
1000.0000 mL | ORAL | Status: DC
  Administered 2014-12-28: 1000 mL
  Filled 2014-12-28 (×2): qty 1000

## 2014-12-28 MED ORDER — AMIODARONE HCL IN DEXTROSE 360-4.14 MG/200ML-% IV SOLN
30.0000 mg/h | INTRAVENOUS | Status: DC
  Administered 2014-12-28 – 2014-12-29 (×3): 30 mg/h via INTRAVENOUS
  Filled 2014-12-28 (×3): qty 200

## 2014-12-28 MED ORDER — SODIUM BICARBONATE 8.4 % IV SOLN
50.0000 meq | Freq: Once | INTRAVENOUS | Status: AC
  Administered 2014-12-28: 50 meq via INTRAVENOUS

## 2014-12-28 MED ORDER — FUROSEMIDE 10 MG/ML IJ SOLN
40.0000 mg | Freq: Once | INTRAMUSCULAR | Status: AC
  Administered 2014-12-28: 40 mg via INTRAVENOUS

## 2014-12-28 MED ORDER — DOPAMINE-DEXTROSE 3.2-5 MG/ML-% IV SOLN
INTRAVENOUS | Status: AC
  Administered 2014-12-28: 20 mg
  Filled 2014-12-28: qty 250

## 2014-12-28 MED ORDER — HEPARIN BOLUS VIA INFUSION
2000.0000 [IU] | Freq: Once | INTRAVENOUS | Status: AC
  Administered 2014-12-28: 2000 [IU] via INTRAVENOUS
  Filled 2014-12-28: qty 2000

## 2014-12-28 MED ORDER — SODIUM CHLORIDE 0.9 % IV BOLUS (SEPSIS)
500.0000 mL | Freq: Once | INTRAVENOUS | Status: AC
  Administered 2014-12-28: 500 mL via INTRAVENOUS

## 2014-12-28 MED ORDER — PHENYLEPHRINE HCL 10 MG/ML IJ SOLN
0.0000 ug/min | INTRAVENOUS | Status: DC
  Administered 2014-12-28: 50 ug/min via INTRAVENOUS
  Administered 2015-01-07: 30 ug/min via INTRAVENOUS
  Administered 2015-01-07: 20 ug/min via INTRAVENOUS
  Administered 2015-01-07 – 2015-01-08 (×2): 40 ug/min via INTRAVENOUS
  Administered 2015-01-08: 45 ug/min via INTRAVENOUS
  Filled 2014-12-28 (×7): qty 1

## 2014-12-28 MED ORDER — NOREPINEPHRINE BITARTRATE 1 MG/ML IV SOLN
0.0000 ug/min | Freq: Once | INTRAVENOUS | Status: AC
  Administered 2014-12-28: 30 ug/min via INTRAVENOUS
  Filled 2014-12-28: qty 4

## 2014-12-28 MED ORDER — BUDESONIDE 0.5 MG/2ML IN SUSP
0.5000 mg | Freq: Two times a day (BID) | RESPIRATORY_TRACT | Status: DC
  Administered 2014-12-28 – 2015-01-12 (×29): 0.5 mg via RESPIRATORY_TRACT
  Filled 2014-12-28 (×31): qty 2

## 2014-12-28 MED ORDER — PANTOPRAZOLE SODIUM 40 MG IV SOLR
40.0000 mg | Freq: Every day | INTRAVENOUS | Status: DC
Start: 2014-12-28 — End: 2014-12-28
  Administered 2014-12-28: 40 mg via INTRAVENOUS
  Filled 2014-12-28: qty 40

## 2014-12-28 MED ORDER — AMIODARONE LOAD VIA INFUSION
150.0000 mg | Freq: Once | INTRAVENOUS | Status: AC
  Administered 2014-12-28: 150 mg via INTRAVENOUS
  Filled 2014-12-28: qty 83.34

## 2014-12-28 MED ORDER — DEXTROSE 5 % IV SOLN
0.0000 ug/min | INTRAVENOUS | Status: DC
  Administered 2014-12-28: 20 ug/min via INTRAVENOUS
  Administered 2014-12-28: 4 ug/min via INTRAVENOUS
  Administered 2014-12-29: 5 ug/min via INTRAVENOUS
  Administered 2014-12-30: 4 ug/min via INTRAVENOUS
  Administered 2014-12-30: 5 ug/min via INTRAVENOUS
  Administered 2015-01-01: 4 ug/min via INTRAVENOUS
  Administered 2015-01-01: 6 ug/min via INTRAVENOUS
  Administered 2015-01-01: 8 ug/min via INTRAVENOUS
  Administered 2015-01-02: 9 ug/min via INTRAVENOUS
  Administered 2015-01-02: 10 ug/min via INTRAVENOUS
  Administered 2015-01-03: 40 ug/min via INTRAVENOUS
  Administered 2015-01-03: 15 ug/min via INTRAVENOUS
  Filled 2014-12-28 (×12): qty 4

## 2014-12-28 MED ORDER — VASOPRESSIN 20 UNIT/ML IV SOLN
0.0300 [IU]/min | Freq: Once | INTRAVENOUS | Status: DC
  Filled 2014-12-28: qty 2

## 2014-12-28 MED ORDER — PROPOFOL 1000 MG/100ML IV EMUL
5.0000 ug/kg/min | INTRAVENOUS | Status: DC
  Administered 2014-12-28 (×2): 60 ug/kg/min via INTRAVENOUS
  Administered 2014-12-28: 55 ug/kg/min via INTRAVENOUS
  Administered 2014-12-28: 20 ug/kg/min via INTRAVENOUS
  Administered 2014-12-29: 60 ug/kg/min via INTRAVENOUS
  Administered 2014-12-30: 20 ug/kg/min via INTRAVENOUS
  Administered 2014-12-30 (×3): 30 ug/kg/min via INTRAVENOUS
  Administered 2014-12-31: 20 ug/kg/min via INTRAVENOUS
  Administered 2014-12-31: 45 ug/kg/min via INTRAVENOUS
  Administered 2014-12-31: 30 ug/kg/min via INTRAVENOUS
  Administered 2015-01-01: 15 ug/kg/min via INTRAVENOUS
  Administered 2015-01-02: 35 ug/kg/min via INTRAVENOUS
  Administered 2015-01-02: 25 ug/kg/min via INTRAVENOUS
  Administered 2015-01-02: 35 ug/kg/min via INTRAVENOUS
  Administered 2015-01-03: 25 ug/kg/min via INTRAVENOUS
  Administered 2015-01-03 – 2015-01-05 (×2): 5 ug/kg/min via INTRAVENOUS
  Administered 2015-01-06: 10 ug/kg/min via INTRAVENOUS
  Administered 2015-01-06: 5 ug/kg/min via INTRAVENOUS
  Administered 2015-01-07: 30 ug/kg/min via INTRAVENOUS
  Administered 2015-01-07: 10 ug/kg/min via INTRAVENOUS
  Administered 2015-01-07: 25 ug/kg/min via INTRAVENOUS
  Administered 2015-01-08: 30 ug/kg/min via INTRAVENOUS
  Administered 2015-01-08: 40 ug/kg/min via INTRAVENOUS
  Administered 2015-01-08: 20 ug/kg/min via INTRAVENOUS
  Administered 2015-01-08: 30 ug/kg/min via INTRAVENOUS
  Administered 2015-01-09: 15 ug/kg/min via INTRAVENOUS
  Filled 2014-12-28 (×36): qty 100

## 2014-12-28 NOTE — Progress Notes (Signed)
PULMONARY / CRITICAL CARE MEDICINE   Name: [name] MRN: [id number] DOB: 1951/01/31    ADMISSION DATE:  12/23/2014 CONSULTATION DATE:  12/23/2014  REFERRING MD :  Dr. Karleen Hampshire   CHIEF COMPLAINT:  Shortness of breath   INITIAL PRESENTATION:  64 yo female smoker with dyspnea, VDRF from pneumococcal PNA and septic shock.  She has hx of HTN, GERD, PAF, CMML, COPD.  STUDIES:  9/05  CT Chest w/o >> diffuse bilateral airspace disease, consolidation involving all lobes of L/R lung, several discrete nodules present in upper lobes, bulky mediastinal adenopathy  SIGNIFICANT EVENTS: 9/05  Admit with worsening SOB  9/06 intubated for hypoxia while receiving fluid bolus ? Edema, levo gtt 9/10 Extubated, transfuse 1 unit PRBC 9/11 A fib with RVR >> started amiodarone/levophed, respiratory failure >> re-intubated  SUBJECTIVE:  Events of last night noted.  Remains on levophed, amiodarone.  Agitated with WUA.  VITAL SIGNS: Temp:  [97.8 F (36.6 C)-99.3 F (37.4 C)] 98.2 F (36.8 C) (09/11 0800) Pulse Rate:  [91-123] 110 (09/11 0908) Resp:  [11-28] 28 (09/11 1000) BP: (97-149)/(56-93) 118/56 mmHg (09/11 1000) SpO2:  [65 %-100 %] 100 % (09/11 0908) FiO2 (%):  [60 %-100 %] 100 % (09/11 0908) Weight:  [160 lb 4.4 oz (72.7 kg)] 160 lb 4.4 oz (72.7 kg) (09/11 0330)   VENTILATOR SETTINGS: Vent Mode:  [-] PRVC FiO2 (%):  [60 %-100 %] 100 % Set Rate:  [8 bmp-28 bmp] 28 bmp Vt Set:  [500 mL] 500 mL PEEP:  [5 cmH20] 5 cmH20 Plateau Pressure:  [22 cmH20-25 cmH20] 22 cmH20   INTAKE / OUTPUT:  Intake/Output Summary (Last 24 hours) at 12/28/14 1039 Last data filed at 12/28/14 1000  Gross per 24 hour  Intake 1082.03 ml  Output   1925 ml  Net -842.97 ml    PHYSICAL EXAMINATION: General: sedated Neuro: RASS -2 HEENT:  ETT in place Cardiovascular:  Regular, 2/6 SM  Lungs: basilar crackles Abdomen:  Ileostomy, mid-line old abd wound dressing c/d/i Musculoskeletal: no edema Skin: no rash    LABS:  CBC  Recent Labs Lab 12/27/14 0420 12/27/14 2115 12/28/14 0415  WBC 10.4 8.8 15.6*  HGB 7.0* 8.2* 9.7*  HCT 21.9* 26.1* 30.2*  PLT 145* 139* 182   BMET  Recent Labs Lab 12/26/14 2353 12/27/14 0420 12/28/14 0415  NA 134* 132* 137  K 3.8 3.7 4.0  CL 92* 91* 93*  CO2 35* 34* 32  BUN 40* 44* 45*  CREATININE 1.23* 1.12* 1.25*  GLUCOSE 129* 193* 118*   Electrolytes  Recent Labs Lab 12/27/2014 2006  12/26/14 0445 12/26/14 2353 12/27/14 0420 12/28/14 0415  CALCIUM  --   < > 7.2* 7.4* 7.5* 8.4*  MG 0.9*  < > 1.2* 2.1 1.9 1.7  PHOS 5.4*  --  3.6  --  3.4  --   < > = values in this interval not displayed.   Sepsis Markers  Recent Labs Lab 12/23/2014 1611 12/23/14 1830 12/23/14 2150 12/24/14 0536 12/25/14 0412  LATICACIDVEN 1.10 1.1  --   --   --   PROCALCITON  --   --  0.57 1.59 1.88   ABG  Recent Labs Lab 12/24/14 0427 12/26/14 1100 12/28/14 0710  PHART 7.282* 7.415 7.442  PCO2ART 39.5 54.1* 42.9  PO2ART 138* 49.8* 76.0*   Liver Enzymes  Recent Labs Lab 12/23/14 0405 12/24/14 0536 12/25/14 0412  AST 10* 11* 10*  ALT 9* 8* 8*  ALKPHOS 137* 125 122  BILITOT  0.3 0.7 0.4  ALBUMIN 2.6* 2.0* 2.0*   Cardiac Enzymes  Recent Labs Lab 12/23/14 2150 12/24/14 0536 12/28/14 0415  TROPONINI <0.03 <0.03 <0.03     Glucose  Recent Labs Lab 12/26/14 2321 12/27/14 0350 12/27/14 0857 12/27/14 1147 12/27/14 1519 12/27/14 2033  GLUCAP 93 148* 127* 131* 118* 105*    Imaging Dg Chest Port 1 View  12/28/2014   CLINICAL DATA:  Endotracheal tube placement.  EXAM: PORTABLE CHEST - 1 VIEW  COMPARISON:  12/28/2014  FINDINGS: Endotracheal tube is been placed with tip measuring 4.3 cm above the carina. Right PICC line tip over the low SVC region. Normal heart size. Diffuse bilateral airspace parenchymal infiltrates. Small bilateral pleural effusions.  IMPRESSION: Appliances appear in satisfactory position. Diffuse bilateral pulmonary infiltrates  and small bilateral pleural effusions as previously seen.   Electronically Signed   By: Lucienne Capers M.D.   On: 12/28/2014 06:46   Dg Chest Port 1 View  12/28/2014   CLINICAL DATA:  Shortness of breath. Respiratory distress. History of COPD, pneumonia, ex-smoker.  EXAM: PORTABLE CHEST - 1 VIEW  COMPARISON:  12/27/2014  FINDINGS: Interval removal of endotracheal tube and enteric tube. Right PICC line remains. Increasing bilateral airspace pulmonary infiltrates. Probable small bilateral pleural effusions. Heart size appears normal. No pneumothorax.  IMPRESSION: Increasing bilateral airspace parenchymal infiltrates in the lungs. Small bilateral pleural effusions.   Electronically Signed   By: Lucienne Capers M.D.   On: 12/28/2014 05:51     ASSESSMENT / PLAN:  PULMONARY ETT 9/06 >> 9/10 ETT 9/11 >>  A: Acute hypoxic respiratory failure 2nd to Streptococcal PNA and pulmonary edema. B/l nodular infiltrates with mediastinal LAN s/p bronch 9/07. Tobacco abuse with presumed COPD. P:   Full vent support F/u CXR Scheduled duoneb and budesonide Oxygen to keep SpO2 > 92%  CARDIOVASCULAR Rt PICC 9/06 >> A:  A fib with RVR. Septic shock 2nd to Streptococcal PNA. Hx of hypertension. P:  Continue amiodarone Wean off pressors to keep MAP > 65 Continue ASA Add heparin gtt per pharmacy F/u Echo  RENAL A:   AKI 2nd to sepsis, hypovolemia >> resolved. Metabolic acidosis >> resolved. Hypervolemia. P:   Resume diuresis once off pressors  GASTROINTESTINAL A:   Chronic diarrhea with C diff colonization. Hx of GERD. Nutrition. P:   Tube feeds while on vent Zantac for SUP  HEMATOLOGIC A:   Hx of CMML with chronic anemia. P:  F/u CBC  INFECTIOUS A:   Septic shock 2nd to Streptococcal pneumonia. P:   Day 7 of Abx, currently on fortaz  BCx2 9/5 >> strep viridans  (? Contaminant vs real) C-Diff 9/6 >> colonization  ENDOCRINE A: Hyperglycemia. P: Monitor blood  sugars  NEUROLOGY A: Chronic pain. P: RASS goal -1   CC time 38 minutes.  Chesley Mires, MD [facility] Pulmonary/Critical Care 12/28/2014, 10:39 AM Pager:  [phone] After 3pm call: [phone]

## 2014-12-28 NOTE — Progress Notes (Signed)
Pharmacy: Re- Heparin  Patient's a 64 y.o F on heparin for afib.  First heparin level now back therapeutic at 0.59 (goal 0.3-0.7). No bleeding documented.  Plan: - continue heparin rate at 1000 units/hr - f/u with AM heparin level and adjust if needed.  Dia Sitter, PharmD, BCPS 12/28/2014 7:00 PM

## 2014-12-28 NOTE — Procedures (Signed)
INTUBATION PROCEDURE NOTE  Indication: Acute respiratory failure Consent: Emergent Time Out: yes Medications: RSI: etomidate, versed, fentanyl rocuronium Paralytic/RSI: yes Technique: Direct Blade: MAC 3 Cords Visualized: yes View: Grade 1 # of attempts: 1 Tube confirmation:   Chest rise: yes  Bilateral Breath Sounds: yes  Color change on CO2 detector: yes  ETCO2: no  CXR: Yes Successful placement: yes    Kari Mattes, DO., [place]

## 2014-12-28 NOTE — Progress Notes (Signed)
ANTICOAGULATION CONSULT NOTE - Initial Consult  Pharmacy Consult for heparin Indication: atrial fibrillation  Allergies  Allergen Reactions  . Vancomycin Hives and Rash    wheezing  . Ativan [Lorazepam] Other (See Comments)    "makes me crazy"  . Codeine Nausea And Vomiting  . Tetanus Toxoids Other (See Comments)    Serum reaction  . Penicillins Hives and Rash  . Xarelto [Rivaroxaban] Hives and Rash    (May not be allergic. Vancomycin was also being taken when reaction occurred.)    Patient Measurements: Height: 5\' 5"  (165.1 cm) Weight: 160 lb 4.4 oz (72.7 kg) IBW/kg (Calculated) : 57 Heparin Dosing Weight: 73kg  Vital Signs: Temp: 98.2 F (36.8 C) (09/11 0800) Temp Source: Oral (09/11 0800) BP: 72/21 mmHg (09/11 1045) Pulse Rate: 102 (09/11 1045)  Labs:  Recent Labs  12/26/14 2353 12/27/14 0420 12/27/14 2115 12/28/14 0415  HGB  --  7.0* 8.2* 9.7*  HCT  --  21.9* 26.1* 30.2*  PLT  --  145* 139* 182  CREATININE 1.23* 1.12*  --  1.25*  TROPONINI  --   --   --  <0.03    Estimated Creatinine Clearance: 44.8 mL/min (by C-G formula based on Cr of 1.25).   Medical History: Past Medical History  Diagnosis Date  . Hypertension     She has a past hx of essential  . 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 Inflammation of joint of knee     Since we last last saw her she developed problems with an acute which required surgical drainage by her orthopedist Dr. Durward Fortes.  Marland Kitchen MRSA (methicillin resistant Staphylococcus aureus)     Knee surgery drainage was positive for MRSA and she was treated with 3 weeks of doxycycline successfully.  . Diarrhea     Mild  . Exogenous obesity   . GERD (gastroesophageal reflux disease)     2 hosp.- ischemic colitis - residual of Norovirus, 05/2011- sm. bowel obstruction  . Headache(784.0)     migraine headache on occas, less now than when she was younger   .  Arthritis     L hip, back, neck   . History of blood transfusion sept 2013    04/2011- /w ischemic colitis , trouble with matching blood  sept 2013  . Anemia     will see hematology consult prior to surgery, recommended by Dr. Mare Ferrari  . Anemia 12/15/2010  . Ischemic colitis 01/31/2012  . Atrial flutter     during hospitalization, 04/2011- related to anemia & illness/stress   . Pneumonia     04/2011- not hospitalized , pt. denies SOB, changes in chest, breathing  . 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 Dizziness     occasional  . B12 deficiency 02/27/2013  . 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 COPD (chronic obstructive pulmonary disease) 09/26/2013    Assessment:  64 year old female presents with increased shortness of breath over the last 4 days associated which chest pain. She's been treated multiple times for pneumonia with her primary care physician recently. She has been admitted to the hospital previously with ARDS and multifocal pneumonia. Patient known to pharmacy for antibiotic dosing.  The night on early 9/11 am patient developed afib w/ RVR.  Pharmacy asked to dose heparin gtt.   Today, 12/28/2014  CBC: Hgb = 9.7 following PRBC 9/10 (Hgb  was 7). Thrombocytopenia resolved  History of chronic anemia with chronic myelocytic monoclonal leukemia  Renal: Scr trending up but CrCl >30  Enoxaparin 40mg  SQ - stopped, last dose 2200 9/10  Goal of Therapy:  Heparin level 0.3-0.7 units/ml Monitor platelets by anticoagulation protocol: Yes   Plan:   Reduce dose heparin bolus 2000 units x 1 then 1000 units/hr  Check 6h heparin level   Daily heparin level and CBC  Monitor CBC closely  Doreene Eland, PharmD, BCPS.   Pager: [phone]  12/28/2014,10:49 AM

## 2014-12-28 NOTE — Progress Notes (Signed)
eLink Physician-Brief Progress Note Patient Name: [name] DOB: 24-Nov-1950 MRN: [id number]   Date of Service  12/28/2014  HPI/Events of Note  AFIB with RVR - ventricular rate in 160's. Cardizem IV infusion at 10 mg/hour.  eICU Interventions  Will send AM BMP and Magnesium level now.      Intervention Category Major Interventions: Arrhythmia - evaluation and management  Dyami Umbach Eugene 12/28/2014, 4:02 AM

## 2014-12-28 NOTE — Progress Notes (Signed)
eLink Physician-Brief Progress Note Patient Name: [name] DOB: [birth year] MRN: [id number]   Date of Service  12/28/2014  HPI/Events of Note  Now in respiratory distress.   eICU Interventions  Will order: 1. BiPAP 12/5. Keep sat >= 92%.  2. ABG at 6:15 AM. 3. Lasix 40 mg IV now.  4. Portable CXR now.      Intervention Category Intermediate Interventions: Respiratory distress - evaluation and management  Sommer,Steven Eugene 12/28/2014, 5:05 AM

## 2014-12-28 NOTE — Progress Notes (Signed)
eLink Physician-Brief Progress Note Patient Name: [name] DOB: October 11, 1950 MRN: [id number]   Date of Service  12/28/2014  HPI/Events of Note  Remains in AFIB/RVR with ventriclular rate 140's to 150's. Now on Cardizem IV infusion at 15 mg/hour. BP = 94/81. Await results of Mg++ and K+.  eICU Interventions  Will order: 1. 0.9 NaCl 500 mL IV over 30 minutes now.  2. Cycle Troponins. 3. Amiodarone IV bolus and infusion.      Intervention Category Major Interventions: Arrhythmia - evaluation and management  Kaelan Amble [place] 12/28/2014, 4:47 AM

## 2014-12-28 NOTE — Plan of Care (Signed)
PCCM Plan of Care  Called to the bedside by [facility] Attending for respiratory distress and refractory Afib with RVR.  Pt was indeed in acute respiratory distress with rapid shallow breathing and copious secretions, diffuse b/l course BS, rhonchi and wheezes present.  Acrocyanosis noted, unable to speak in 3 word sentences.  The patient lost consciousness, immediately after bipap was removed.  We administered valium 5mg  IV for agitation and anxiety and began bagging the patient.  Her BP dropped precipitously.  She was given a liter of NS bolus and dopamine was started as it was the only vasopressor available emergently.  Her hypotension persisted and she required dopamine, norepinephrine and phenylephrine to achieve acceptable BP.  Intubation was postponed to acquire adequate BP.  She was electrically cardioverted to NSR and given one amp sodium bicarb with improvement of the BP.  We intubated her successfully and set her MV at 45 for suspected acidemia.  Diltiazem was d/ced and amiodarone continued given her shock.  Plan for immediate future includes:   - ABG now  - CXR now  - If Afib with RVR >150 then give Metop IV 5mg  Q81min  - If Afib rvr with hypotension...cardiovert  - continue phenylephrine via picc  - down titrate and d/c dopamine first, then norepi (ok to increase phenylephrine to accomplish this)  - If hypotensive give 1L bolus  Total critical care time: 60 min  Critical care time was exclusive of separately billable procedures and treating other patients.  Critical care was necessary to treat or prevent imminent or life-threatening deterioration.  Critical care was time spent personally by me on the following activities: development of treatment plan with patient and/or surrogate as well as nursing, discussions with consultants, evaluation of patient's response to treatment, examination of patient, obtaining history from patient or surrogate, ordering and performing treatments and  interventions, ordering and review of laboratory studies, ordering and review of radiographic studies, pulse oximetry and re-evaluation of patient's condition.   Meribeth Mattes, DO., MS [facility]

## 2014-12-29 ENCOUNTER — Inpatient Hospital Stay (HOSPITAL_COMMUNITY): Payer: 59

## 2014-12-29 DIAGNOSIS — I4892 Unspecified atrial flutter: Secondary | ICD-10-CM

## 2014-12-29 DIAGNOSIS — J9600 Acute respiratory failure, unspecified whether with hypoxia or hypercapnia: Secondary | ICD-10-CM | POA: Insufficient documentation

## 2014-12-29 LAB — GLUCOSE, CAPILLARY
GLUCOSE-CAPILLARY: 102 mg/dL — AB (ref 65–99)
GLUCOSE-CAPILLARY: 105 mg/dL — AB (ref 65–99)
GLUCOSE-CAPILLARY: 66 mg/dL (ref 65–99)
GLUCOSE-CAPILLARY: 77 mg/dL (ref 65–99)
GLUCOSE-CAPILLARY: 74 mg/dL (ref 65–99)
GLUCOSE-CAPILLARY: 42 mg/dL — AB (ref 65–99)
Glucose-Capillary: 119 mg/dL — ABNORMAL HIGH (ref 65–99)
Glucose-Capillary: 22 mg/dL — CL (ref 65–99)
Glucose-Capillary: 221 mg/dL — ABNORMAL HIGH (ref 65–99)
Glucose-Capillary: 58 mg/dL — ABNORMAL LOW (ref 65–99)
Glucose-Capillary: 96 mg/dL (ref 65–99)

## 2014-12-29 LAB — BASIC METABOLIC PANEL
ANION GAP: 10 (ref 5–15)
BUN: 43 mg/dL — ABNORMAL HIGH (ref 6–20)
CALCIUM: 7.5 mg/dL — AB (ref 8.9–10.3)
CO2: 31 mmol/L (ref 22–32)
Chloride: 93 mmol/L — ABNORMAL LOW (ref 101–111)
Creatinine, Ser: 1.32 mg/dL — ABNORMAL HIGH (ref 0.44–1.00)
GFR calc Af Amer: 48 mL/min — ABNORMAL LOW (ref >60)
GFR, EST NON AFRICAN AMERICAN: 41 mL/min — AB (ref >60)
Glucose, Bld: 150 mg/dL — ABNORMAL HIGH (ref 65–99)
POTASSIUM: 2.8 mmol/L — AB (ref 3.5–5.1)
SODIUM: 134 mmol/L — AB (ref 135–145)

## 2014-12-29 LAB — CBC
HCT: 24 % — ABNORMAL LOW (ref 36.0–46.0)
Hemoglobin: 8.2 g/dL — ABNORMAL LOW (ref 12.0–15.0)
MCH: 34 pg (ref 26.0–34.0)
MCHC: 34.2 g/dL (ref 30.0–36.0)
MCV: 99.6 fL (ref 78.0–100.0)
PLATELETS: 193 10*3/uL (ref 150–400)
RBC: 2.41 MIL/uL — AB (ref 3.87–5.11)
RDW: 18.1 % — AB (ref 11.5–15.5)
WBC: 12.9 10*3/uL — AB (ref 4.0–10.5)

## 2014-12-29 LAB — TYPE AND SCREEN
ABO/RH(D): A POS
ANTIBODY SCREEN: NEGATIVE
Donor AG Type: NEGATIVE
UNIT DIVISION: 0

## 2014-12-29 LAB — HEPARIN LEVEL (UNFRACTIONATED): Heparin Unfractionated: 0.58 IU/mL (ref 0.30–0.70)

## 2014-12-29 LAB — MAGNESIUM: MAGNESIUM: 1.4 mg/dL — AB (ref 1.7–2.4)

## 2014-12-29 MED ORDER — DEXTROSE 50 % IV SOLN
INTRAVENOUS | Status: AC
  Filled 2014-12-29: qty 50

## 2014-12-29 MED ORDER — VITAL HIGH PROTEIN PO LIQD
1000.0000 mL | ORAL | Status: DC
  Administered 2014-12-29 – 2015-01-03 (×6): 1000 mL
  Filled 2014-12-29 (×7): qty 1000

## 2014-12-29 MED ORDER — POTASSIUM CHLORIDE 20 MEQ/15ML (10%) PO SOLN
40.0000 meq | Freq: Once | ORAL | Status: AC
  Administered 2014-12-29: 40 meq
  Filled 2014-12-29: qty 30

## 2014-12-29 MED ORDER — DEXTROSE 50 % IV SOLN
INTRAVENOUS | Status: AC
  Administered 2014-12-29: 50 mL
  Filled 2014-12-29: qty 50

## 2014-12-29 MED ORDER — MAGNESIUM SULFATE 50 % IJ SOLN
3.0000 g | Freq: Once | INTRAVENOUS | Status: AC
  Administered 2014-12-29: 3 g via INTRAVENOUS
  Filled 2014-12-29: qty 6

## 2014-12-29 MED ORDER — DEXTROSE 50 % IV SOLN: 1.0000 | Freq: Once | INTRAVENOUS | Status: AC

## 2014-12-29 MED ORDER — SODIUM CHLORIDE 0.9 % IV SOLN
INTRAVENOUS | Status: DC
  Administered 2014-12-29 – 2015-01-01 (×4): via INTRAVENOUS

## 2014-12-29 MED ORDER — DEXTROSE-NACL 5-0.9 % IV SOLN: INTRAVENOUS | Status: DC

## 2014-12-29 MED ORDER — POTASSIUM CHLORIDE 10 MEQ/50ML IV SOLN
10.0000 meq | INTRAVENOUS | Status: AC
  Administered 2014-12-29 (×6): 10 meq via INTRAVENOUS
  Filled 2014-12-29 (×6): qty 50

## 2014-12-29 NOTE — Progress Notes (Signed)
eLink Physician-Brief Progress Note Patient Name: [name] DOB: [birth year] MRN: [id number]   Date of Service  12/29/2014  HPI/Events of Note  Shock, glu low d50 bolus again but  eICU Interventions  Send glu from line to lab and compare to fingerstick Change fluids to d5saline Dc saline Recheck glu on 15 min      Intervention Category Major Interventions: Electrolyte abnormality - evaluation and management  Demetric Parslow J. 12/29/2014, 9:18 PM

## 2014-12-29 NOTE — Progress Notes (Signed)
eLink Physician-Brief Progress Note Patient Name: Zarra Geffert DOB: January 23, 1951 MRN: [id number]   Date of Service  12/29/2014  HPI/Events of Note  Glu is error from glucometer Dc d5 in fluids  eICU Interventions  All glu from line to lab     Intervention Category Intermediate Interventions: Electrolyte abnormality - evaluation and management  Kylen Ismael J. 12/29/2014, 9:30 PM

## 2014-12-29 NOTE — Progress Notes (Signed)
Nutrition Follow-up  INTERVENTION:   Continue Vital HP @ 20 ml/hr via OGT and increase by 10 ml every 4 hours to goal rate of 50 ml/hr.   Tube feeding regimen + current Propofol infusion provides 1546 kcal (100% of needs), 105 grams of protein, and 1003 ml of H2O.   RD to continue to monitor  NUTRITION DIAGNOSIS:   Inadequate oral intake related to inability to eat as evidenced by NPO status.  Ongoing.  GOAL:   Patient will meet greater than or equal to 90% of their needs  Not meeting yet.  MONITOR:   Vent status, TF tolerance, Weight trends, Labs, I & O's  REASON FOR ASSESSMENT:   Consult Assessment of nutrition requirement/status  ASSESSMENT:   64 y.o. female with a past medical history of COPD, cigarette smoker, history of multiple pneumonias, history of C. difficile, history of partial colectomy and ileostomy, paroxysmal atrial fibrillation hypertension, chronic myelocytic monoclonal leukemia, chronic anemia that requires Epogen injections and occasional blood transfusions, MRSA carrier, GERD who comes to the emergency department due to worsening shortness of breath, pleuritic chest pain, fatigue, cold sweats for several days. She has also been having occasionally productive cough of mucus, decreased appetite, difficulty sleeping and dehydration due to high ileostomy output with decreased oral intake.  Pt was extubated 9/10. Pt re-intubated 9/11.  Patient is currently intubated on ventilator support MV: 13.3 L/min Temp (24hrs), Avg:99.1 F (37.3 C), Min:98.3 F (36.8 C), Max:100.1 F (37.8 C)  Propofol: 13.1 ml/hr -> provides 346 fat kcal  Labs reviewed: Low Na, K, Mg Elevated BUN & Creatinine  Diet Order:  Diet NPO time specified  Skin:  Wound (see comment) (abdominal incision)  Last BM:  9/12  Height:   Ht Readings from Last 1 Encounters:  01/04/2015 5\' 5"  (1.651 m)    Weight:   Wt Readings from Last 1 Encounters:  12/29/14 162 lb 14.7 oz (73.9 kg)     Ideal Body Weight:  56.82 kg (kg)  BMI:  Body mass index is 27.11 kg/(m^2).  Estimated Nutritional Needs:   Kcal:  1501  Protein:  90-100g  Fluid:  1.5L/day  EDUCATION NEEDS:   No education needs identified at this time  Clayton Bibles, MS, RD, LDN Pager: [phone] After Hours Pager: [phone]

## 2014-12-29 NOTE — Progress Notes (Signed)
Date:  Sept. 12, 2016 U.R. performed for needs and level of care. On iv aminodarone [id number] with rvr, re intubated on [id number] acute resp failure. Will continue to follow for Case Management needs.  [name], RN, BSN, [place]   [phone]

## 2014-12-29 NOTE — Progress Notes (Signed)
  Echocardiogram 2D Echocardiogram has been performed.  [name] 12/29/2014, 2:40 PM

## 2014-12-29 NOTE — Progress Notes (Signed)
ANTIBIOTIC CONSULT NOTE - Follow up Pharmacy Consult for Ceftazidime Indication: HCAP  ANTICOAGULANT CONSULT NOTE - Follow up Pharmacy Consult for Heparin Indication: Atrial Fibrillation  Allergies  Allergen Reactions  . Vancomycin Hives and Rash    wheezing  . Ativan [Lorazepam] Other (See Comments)    "makes me crazy"  . Codeine Nausea And Vomiting  . Digoxin And Related Swelling  . Tetanus Toxoids Other (See Comments)    Serum reaction  . Penicillins Hives and Rash  . Xarelto [Rivaroxaban] Hives and Rash    (May not be allergic. Vancomycin was also being taken when reaction occurred.)    Patient Measurements: Height: 5\' 5"  (165.1 cm) Weight: 162 lb 14.7 oz (73.9 kg) IBW/kg (Calculated) : 57  Vital Signs: Temp: 98.7 F (37.1 C) (09/12 0400) Temp Source: Axillary (09/12 0400) BP: 86/49 mmHg (09/12 1030) Pulse Rate: 96 (09/12 1030) Intake/Output from previous day: 09/11 0701 - 09/12 0700 In: 2572.5 [I.V.:1712.5; NG/GT:610; IV Piggyback:50] Out: 2637 [Urine:1045; Stool:650]  Labs:  Recent Labs  12/27/14 0420 12/27/14 2115 12/28/14 0415 12/29/14 0445  WBC 10.4 8.8 15.6* 12.9*  HGB 7.0* 8.2* 9.7* 8.2*  PLT 145* 139* 182 193  CREATININE 1.12*  --  1.25* 1.32*   Estimated Creatinine Clearance: 42.8 mL/min (by C-G formula based on Cr of 1.32).   Recent Labs  12/27/14 0420 12/27/14 2115 12/28/14 0415 12/28/14 1005 12/28/14 1700 12/28/14 1811 12/29/14 0445 12/29/14 0900  HGB 7.0* 8.2* 9.7*  --   --   --  8.2*  --   HCT 21.9* 26.1* 30.2*  --   --   --  24.0*  --   PLT 145* 139* 182  --   --   --  193  --   HEPARINUNFRC  --   --   --   --   --  0.59  --  0.58  CREATININE 1.12*  --  1.25*  --   --   --  1.32*  --   TROPONINI  --   --  <0.03 0.03 0.03  --   --   --      Assessment: 64 year old female presents with increased shortness of breath over the last 4 days associated which chest pain. She's been treated multiple times for pneumonia with her  primary care physician recently. She has been admitted to the hospital previously with ARDS and multifocal pneumonia.  Allergy to vancomycin with wheezing noted. Also allergic to PCNs, but has tolerated numerous cephalosporins.  ID ok with using Zyvox, as vanco and dapto are contraindicated.  Levofloxacin ordered x 1 in ED, but not given.  Pharmacy is consulted to dose ceftazidime.  The night on early 9/11 am patient developed afib w/ RVR. Pharmacy asked to dose heparin gtt.   Anti-Infectives 9/5 >> Ceftazidime >> 9/5 >> Zyvox (MD) >> 9/9 9/7 >> Tobramycin >> 9/8  9/5 blood: strep viridans x 1 9/5 C.diff Ag +, Toxin - 9/5 MRSA PCR: positive 9/5 Strep pneumo Ur Ag: negative 9/7 BAL: NGTD 9/7 AFB: NGTD  As of 9/12 >> WBC 12.9, afebrile, HR 90-113, CrCl 42.8   Anticoagulation Assessment AFib with CHADS2VASc = 3, PLT 193, Hgb 8.2 9/11 >> HL = 0.59 @Bolus  + 1000 units/hr 9/12 >> HL = 0.58 @1000  units/hr  Goal of Therapy:  Eradication of infection Appropriate antibiotic dosing for indication and renal function Heparin level 0.3-0.7 units/ml Monitor platelets by anticoagulation protocol: Yes   Plan:  Day 8 antibiotics --Continue Ceftazidime 2g IV  q12 hr (consider narrowing to ceftriaxone) --Continue Heparin 1000 units/hr --Follow clinical course, renal function, HL and CBC in AM  Viann Fish  12/29/2014 11:24 AM

## 2014-12-29 NOTE — Progress Notes (Signed)
PULMONARY / CRITICAL CARE MEDICINE   Name: [name] MRN: [id number] DOB: 1950-05-10    ADMISSION DATE:  01/09/2015 CONSULTATION DATE:  12/23/2014  REFERRING MD :  Dr. Karleen Hampshire   CHIEF COMPLAINT:  Shortness of breath   INITIAL PRESENTATION:  64 yo female smoker with dyspnea, VDRF from pneumococcal PNA and septic shock.  She has hx of HTN, GERD, PAF, CMML, COPD.  STUDIES:  9/05  CT Chest w/o >> diffuse bilateral airspace disease, consolidation involving all lobes of L/R lung, several discrete nodules present in upper lobes, bulky mediastinal adenopathy  SIGNIFICANT EVENTS: 9/05  Admit with worsening SOB  9/06 intubated for hypoxia while receiving fluid bolus ? Edema, levo gtt 9/10 Extubated, transfuse 1 unit PRBC 9/11 A fib with RVR >> started amiodarone/levophed, respiratory failure >> re-intubated 9/12 mottled. ECHO pending. Appropriate. Scr bumped.   SUBJECTIVE:  No distress,  on vent.   VITAL SIGNS: Temp:  [98.3 F (36.8 C)-100.1 F (37.8 C)] 99.3 F (37.4 C) (09/12 1200) Pulse Rate:  [87-117] 98 (09/12 1445) Resp:  [15-28] 25 (09/12 1445) BP: (74-146)/(33-77) 120/71 mmHg (09/12 1445) SpO2:  [92 %-100 %] 98 % (09/12 1500) FiO2 (%):  [30 %-90 %] 30 % (09/12 1500) Weight:  [73.9 kg (162 lb 14.7 oz)] 73.9 kg (162 lb 14.7 oz) (09/12 0300)   VENTILATOR SETTINGS: Vent Mode:  [-] PRVC FiO2 (%):  [30 %-90 %] 30 % Set Rate:  [28 bmp] 28 bmp Vt Set:  [500 mL] 500 mL PEEP:  [5 cmH20] 5 cmH20 Plateau Pressure:  [19 cmH20-24 cmH20] 19 cmH20   INTAKE / OUTPUT:  Intake/Output Summary (Last 24 hours) at 12/29/14 1520 Last data filed at 12/29/14 1343  Gross per 24 hour  Intake 2023.98 ml  Output   1810 ml  Net 213.98 ml    PHYSICAL EXAMINATION: General: awake, f/c no distress.  Neuro: RASS -2 HEENT:  ETT in place Cardiovascular:  Regular, 2/6 SM  Lungs: basilar crackles, no accessory muscle use  Abdomen:  Ileostomy, mid-line old abd wound dressing  c/d/i Musculoskeletal: no edema Skin: no rash   LABS:  CBC  Recent Labs Lab 12/27/14 2115 12/28/14 0415 12/29/14 0445  WBC 8.8 15.6* 12.9*  HGB 8.2* 9.7* 8.2*  HCT 26.1* 30.2* 24.0*  PLT 139* 182 193   BMET  Recent Labs Lab 12/27/14 0420 12/28/14 0415 12/29/14 0445  NA 132* 137 134*  K 3.7 4.0 2.8*  CL 91* 93* 93*  CO2 34* 32 31  BUN 44* 45* 43*  CREATININE 1.12* 1.25* 1.32*  GLUCOSE 193* 118* 150*   Electrolytes  Recent Labs Lab 12/25/2014 2006  12/26/14 0445  12/27/14 0420 12/28/14 0415 12/29/14 0445  CALCIUM  --   < > 7.2*  < > 7.5* 8.4* 7.5*  MG 0.9*  < > 1.2*  < > 1.9 1.7 1.4*  PHOS 5.4*  --  3.6  --  3.4  --   --   < > = values in this interval not displayed.   Sepsis Markers  Recent Labs Lab 01/11/2015 1611 12/23/14 1830 12/23/14 2150 12/24/14 0536 12/25/14 0412  LATICACIDVEN 1.10 1.1  --   --   --   PROCALCITON  --   --  0.57 1.59 1.88   ABG  Recent Labs Lab 12/24/14 0427 12/26/14 1100 12/28/14 0710  PHART 7.282* 7.415 7.442  PCO2ART 39.5 54.1* 42.9  PO2ART 138* 49.8* 76.0*   Liver Enzymes  Recent Labs Lab 12/23/14 0405 12/24/14 0536 12/25/14  0412  AST 10* 11* 10*  ALT 9* 8* 8*  ALKPHOS 137* 125 122  BILITOT 0.3 0.7 0.4  ALBUMIN 2.6* 2.0* 2.0*   Cardiac Enzymes  Recent Labs Lab 12/28/14 0415 12/28/14 1005 12/28/14 1700  TROPONINI <0.03 0.03 0.03     Glucose  Recent Labs Lab 12/28/14 1907 12/29/14 0135 12/29/14 0346 12/29/14 0837 12/29/14 0917 12/29/14 1150  GLUCAP 92 96 102* 66 105* 77    Imaging Dg Chest Port 1 View  12/29/2014   CLINICAL DATA:  64 year old female with respiratory failure  EXAM: PORTABLE CHEST - 1 VIEW  COMPARISON:  Prior chest x-ray 12/28/2014  FINDINGS: Patient remains intubated. The tip of the endotracheal tube is 3.9 cm above the carina. External defibrillator pad projects over the right upper chest. Right upper extremity approach PICC. Catheter tip projects over the mid SVC. A  nasogastric tube has been placed. The tip is not visible within the imaged field of view but lies below the diaphragm and is presumably within the stomach.  There are right larger than left layering pleural effusions with associated bibasilar atelectasis. Diffuse bilateral interstitial opacities with scattered patchy airspace opacities. Overall, there may be a slight improvement in the aeration compared to the most recent prior chest x-ray. No acute osseous abnormality.  IMPRESSION: 1. New nasogastric tube. The tip is not identified but lies below the diaphragm presumably in the stomach. Otherwise, stable and satisfactory support apparatus. 2. Perhaps slight interval improvement in aeration likely secondary to an interval decrease in pulmonary edema. 3. Persistent right larger than left layering pleural effusions, bibasilar atelectasis and diffuse bilateral interstitial and scattered airspace opacities favored to reflect pulmonary edema. Additional considerations include pneumonia, pneumonitis and ARDS.   Electronically Signed   By: Jacqulynn Cadet M.D.   On: 12/29/2014 07:52  PCXR:   ASSESSMENT / PLAN:  PULMONARY ETT 9/06 >> 9/10 ETT 9/11 >>  A: Acute hypoxic respiratory failure 2nd to Streptococcal PNA and pulmonary edema. B/l nodular infiltrates with mediastinal LAN s/p bronch 9/07. Tobacco abuse with presumed COPD. CXR w/out much change. Prob element of effusion  P:   Full vent support F/u CXR Scheduled duoneb and budesonide Oxygen to keep SpO2 > 92% May need to consider thoracentesis   CARDIOVASCULAR Rt PICC 9/06 >> A:  A fib with RVR. Septic shock 2nd to Streptococcal PNA. Hx of hypertension. P:  Continue amiodarone Wean off pressors to keep MAP > 65 Continue ASA Correct K and Mg Added heparin gtt per pharmacy F/u Echo  RENAL A:   AKI 2nd to sepsis, hypovolemia >> had resolved. Now a little worse. May be r/t hypotension  Metabolic acidosis >>  resolved. Hypervolemia. Hypokalemia  P:   Replace K Keep euvolemic  R/u chem in am   GASTROINTESTINAL A:   Chronic diarrhea with C diff colonization. Hx of GERD. Nutrition. P:   Tube feeds while on vent Zantac for SUP  HEMATOLOGIC A:   Hx of CMML with chronic anemia. P:  F/u CBC  INFECTIOUS A:   Septic shock 2nd to Streptococcal pneumonia. P:   Day 8 of Abx, currently on fortaz  BCx2 9/5 >> strep viridans  (? Contaminant vs real) C-Diff 9/6 >> colonization  ENDOCRINE A: Hyperglycemia. P: Monitor blood sugars  NEUROLOGY A: Chronic pain. P: RASS goal -1  No distress on current rx. Will await ECHO, repeat CXR chem in am. Likely repeat Lasix in am. Also will need to look at chest w/ [place] and consider Thora.  Erick Colace ACNP-BC [place] Pager # [phone] OR # [phone] if no answer

## 2014-12-30 ENCOUNTER — Inpatient Hospital Stay (HOSPITAL_COMMUNITY): Payer: 59

## 2014-12-30 DIAGNOSIS — J948 Other specified pleural conditions: Secondary | ICD-10-CM

## 2014-12-30 DIAGNOSIS — I480 Paroxysmal atrial fibrillation: Secondary | ICD-10-CM

## 2014-12-30 DIAGNOSIS — J90 Pleural effusion, not elsewhere classified: Secondary | ICD-10-CM | POA: Insufficient documentation

## 2014-12-30 LAB — BODY FLUID CELL COUNT WITH DIFFERENTIAL
Eos, Fluid: 1 %
Lymphs, Fluid: 36 %
Monocyte-Macrophage-Serous Fluid: 61 % (ref 50–90)
NEUTROPHIL FLUID: 2 % (ref 0–25)
WBC FLUID: 535 uL (ref 0–1000)

## 2014-12-30 LAB — HEPARIN LEVEL (UNFRACTIONATED): HEPARIN UNFRACTIONATED: 0.5 [IU]/mL (ref 0.30–0.70)

## 2014-12-30 LAB — LACTATE DEHYDROGENASE: LDH: 178 U/L (ref 98–192)

## 2014-12-30 LAB — CBC WITH DIFFERENTIAL/PLATELET
BASOS ABS: 0 10*3/uL (ref 0.0–0.1)
BASOS PCT: 0 % (ref 0–1)
Eosinophils Absolute: 0.1 10*3/uL (ref 0.0–0.7)
Eosinophils Relative: 1 % (ref 0–5)
HEMATOCRIT: 24.3 % — AB (ref 36.0–46.0)
HEMOGLOBIN: 8.1 g/dL — AB (ref 12.0–15.0)
LYMPHS ABS: 2.8 10*3/uL (ref 0.7–4.0)
LYMPHS PCT: 22 % (ref 12–46)
MCH: 32.7 pg (ref 26.0–34.0)
MCHC: 33.3 g/dL (ref 30.0–36.0)
MCV: 98 fL (ref 78.0–100.0)
MONOS PCT: 28 % — AB (ref 3–12)
Monocytes Absolute: 3.6 10*3/uL — ABNORMAL HIGH (ref 0.1–1.0)
NEUTROS ABS: 6.3 10*3/uL (ref 1.7–7.7)
Neutrophils Relative %: 49 % (ref 43–77)
Platelets: 200 10*3/uL (ref 150–400)
RBC: 2.48 MIL/uL — ABNORMAL LOW (ref 3.87–5.11)
RDW: 18 % — AB (ref 11.5–15.5)
WBC: 12.8 10*3/uL — ABNORMAL HIGH (ref 4.0–10.5)

## 2014-12-30 LAB — RENAL FUNCTION PANEL
ALBUMIN: 1.7 g/dL — AB (ref 3.5–5.0)
Anion gap: 11 (ref 5–15)
BUN: 47 mg/dL — AB (ref 6–20)
CHLORIDE: 96 mmol/L — AB (ref 101–111)
CO2: 28 mmol/L (ref 22–32)
CREATININE: 1.52 mg/dL — AB (ref 0.44–1.00)
Calcium: 7.6 mg/dL — ABNORMAL LOW (ref 8.9–10.3)
GFR calc Af Amer: 40 mL/min — ABNORMAL LOW (ref >60)
GFR, EST NON AFRICAN AMERICAN: 35 mL/min — AB (ref >60)
GLUCOSE: 128 mg/dL — AB (ref 65–99)
Phosphorus: 3.3 mg/dL (ref 2.5–4.6)
Potassium: 3.2 mmol/L — ABNORMAL LOW (ref 3.5–5.1)
Sodium: 135 mmol/L (ref 135–145)

## 2014-12-30 LAB — GLUCOSE, CAPILLARY
GLUCOSE-CAPILLARY: 202 mg/dL — AB (ref 65–99)
GLUCOSE-CAPILLARY: 139 mg/dL — AB (ref 65–99)
GLUCOSE-CAPILLARY: 93 mg/dL (ref 65–99)
Glucose-Capillary: 63 mg/dL — ABNORMAL LOW (ref 65–99)
Glucose-Capillary: 118 mg/dL — ABNORMAL HIGH (ref 65–99)
Glucose-Capillary: 65 mg/dL (ref 65–99)
Glucose-Capillary: 84 mg/dL (ref 65–99)

## 2014-12-30 LAB — PROTEIN, BODY FLUID: Total protein, fluid: <3 g/dL

## 2014-12-30 LAB — LACTATE DEHYDROGENASE, PLEURAL OR PERITONEAL FLUID: LD, Fluid: 73 U/L — ABNORMAL HIGH (ref 3–23)

## 2014-12-30 LAB — PROTEIN, TOTAL: TOTAL PROTEIN: 3.9 g/dL — AB (ref 6.5–8.1)

## 2014-12-30 LAB — CHOLESTEROL, TOTAL: Cholesterol: 112 mg/dL (ref 0–200)

## 2014-12-30 LAB — TRIGLYCERIDES: TRIGLYCERIDES: 159 mg/dL — AB (ref <150)

## 2014-12-30 LAB — MAGNESIUM: MAGNESIUM: 1.9 mg/dL (ref 1.7–2.4)

## 2014-12-30 MED ORDER — POTASSIUM CHLORIDE 10 MEQ/50ML IV SOLN
INTRAVENOUS | Status: AC
Start: 2014-12-30 — End: 2014-12-30
  Filled 2014-12-30: qty 50

## 2014-12-30 MED ORDER — POTASSIUM CHLORIDE 20 MEQ/15ML (10%) PO SOLN
40.0000 meq | Freq: Once | ORAL | Status: AC
  Administered 2014-12-30: 40 meq
  Filled 2014-12-30: qty 30

## 2014-12-30 MED ORDER — POTASSIUM CHLORIDE 10 MEQ/50ML IV SOLN
10.0000 meq | INTRAVENOUS | Status: AC
  Administered 2014-12-30 (×2): 10 meq via INTRAVENOUS
  Filled 2014-12-30: qty 50

## 2014-12-30 NOTE — Progress Notes (Signed)
ANTICOAGULATION CONSULT NOTE - Initial Consult  Pharmacy Consult for heparin Indication: atrial fibrillation  Allergies  Allergen Reactions  . Vancomycin Hives and Rash    wheezing  . Ativan [Lorazepam] Other (See Comments)    "makes me crazy"  . Codeine Nausea And Vomiting  . Digoxin And Related Swelling  . Tetanus Toxoids Other (See Comments)    Serum reaction  . Penicillins Hives and Rash  . Xarelto [Rivaroxaban] Hives and Rash    (May not be allergic. Vancomycin was also being taken when reaction occurred.)    Patient Measurements: Height: 5\' 5"  (165.1 cm) Weight: 169 lb 1.5 oz (76.7 kg) IBW/kg (Calculated) : 57 Heparin Dosing Weight: 73kg  Vital Signs: Temp: 98.3 F (36.8 C) (09/13 0347) Temp Source: Axillary (09/13 0347) BP: 118/67 mmHg (09/13 0700) Pulse Rate: 92 (09/13 0700)  Labs:  Recent Labs  12/28/14 0415 12/28/14 1005 12/28/14 1700 12/28/14 1811 12/29/14 0445 12/29/14 0900 12/30/14 0450  HGB 9.7*  --   --   --  8.2*  --  8.1*  HCT 30.2*  --   --   --  24.0*  --  24.3*  PLT 182  --   --   --  193  --  200  HEPARINUNFRC  --   --   --  0.59  --  0.58 0.50  CREATININE 1.25*  --   --   --  1.32*  --  1.52*  TROPONINI <0.03 0.03 0.03  --   --   --   --     Estimated Creatinine Clearance: 37.8 mL/min (by C-G formula based on Cr of 1.52).   Medical History: Past Medical History  Diagnosis Date  . Hypertension     She has a past hx of essential  . 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 Inflammation of joint of knee     Since we last last saw her she developed problems with an acute which required surgical drainage by her orthopedist Dr. Durward Fortes.  Marland Kitchen MRSA (methicillin resistant Staphylococcus aureus)     Knee surgery drainage was positive for MRSA and she was treated with 3 weeks of doxycycline successfully.  . Diarrhea     Mild  . Exogenous obesity   . GERD  (gastroesophageal reflux disease)     2 hosp.- ischemic colitis - residual of Norovirus, 05/2011- sm. bowel obstruction  . Headache(784.0)     migraine headache on occas, less now than when she was younger   . Arthritis     L hip, back, neck   . History of blood transfusion sept 2013    04/2011- /w ischemic colitis , trouble with matching blood  sept 2013  . Anemia     will see hematology consult prior to surgery, recommended by Dr. Mare Ferrari  . Anemia 12/15/2010  . Ischemic colitis 01/31/2012  . Atrial flutter     during hospitalization, 04/2011- related to anemia & illness/stress   . Pneumonia     04/2011- not hospitalized , pt. denies SOB, changes in chest, breathing  . 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 Dizziness     occasional  . B12 deficiency 02/27/2013  . 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 COPD (chronic obstructive pulmonary disease) 09/26/2013    Assessment:  64 year old female presents with increased shortness of breath over the last 4  days associated which chest pain. She's been treated multiple times for pneumonia with her primary care physician recently. She has been admitted to the hospital previously with ARDS and multifocal pneumonia. Patient known to pharmacy for antibiotic dosing.  The night on early 9/11 am patient developed afib w/ RVR.  Pharmacy asked to dose heparin gtt.   AFib CHADS2VASc = 3 Hx of Anemia and CMML  9/11 >> HL = 0.59 9/12 >> HL = 0.58 9/13 >> HL = 0.50; Hgb 8.1, PLTs 200  Goal of Therapy:  Heparin level 0.3-0.7 units/ml Monitor platelets by anticoagulation protocol: Yes   Plan:  --Continue Heparin IV 1000 units/hr --Daily HL in AM and reassess --Daily CBC   Viann Fish, PharmD candidate  12/30/2014,8:20 AM

## 2014-12-30 NOTE — Progress Notes (Signed)
Pt had refused blood cultures.  Ivin Booty NP made aware. After informing her of risks pt wanted to have blood cultures drawn. Informed lab of pt decision to have blood cultures drawn.  Irven Baltimore, RN

## 2014-12-30 NOTE — Progress Notes (Signed)
Patients blood sugar at 2017 was 22 gave one amp of D50 per protocol. Rechecked blood sugar at 2040 was 40 gave another amp of D50 per protocol. Rechecked blood sugar at 2107 it was 42, MD notified of patients blood sugar. Blood sugar was checked via central line for accuracy compared to capillary. Central line blood sugar was 221. Protocol ended will now check blood sugars via central line as a result of patient being on pressors.

## 2014-12-30 NOTE — Procedures (Signed)
Thoracentesis Procedure Note  Pre-operative Diagnosis: pleural effusion   Post-operative Diagnosis: same  Indications: evaluation of pleural fluid   Procedure Details  Consent: Informed consent was obtained. Risks of the procedure were discussed including: infection, bleeding, pain, pneumothorax.  Under sterile conditions the patient was positioned. Betadine solution and sterile drapes were utilized.  1% buffered lidocaine was used to anesthetize the  rib space which was identified via real time [place]. Fluid was obtained without any difficulties and minimal blood loss.  A dressing was applied to the wound and wound care instructions were provided.   Findings 650 ml of clear pleural fluid was obtained. A sample was sent to Pathology, cell counts, as well as for infection analysis.  Complications:  None; patient tolerated the procedure well.          Condition: stable  Plan A follow up chest x-ray was ordered. Bed Rest for 0 hours. Tylenol 650 mg. for pain.

## 2014-12-30 NOTE — Progress Notes (Signed)
[facility] ADULT ICU REPLACEMENT PROTOCOL FOR AM LAB REPLACEMENT ONLY  The patient does not apply for the [facility] Adult ICU Electrolyte Replacment Protocol based on the criteria listed below:    Is GFR >/= 40 ml/min? No.  Patient's GFR today is 35   Abnormal electrolyte(s): K3.2   If a panic level lab has been reported, has the CCM MD in charge been notified? Yes.  .   Physician:  Claretta Fraise 12/30/2014 6:40 AM

## 2014-12-30 NOTE — Progress Notes (Signed)
PULMONARY / CRITICAL CARE MEDICINE   Name: [name] MRN: [id number] DOB: July 10, 1950    ADMISSION DATE:  12/19/2014 CONSULTATION DATE:  12/23/2014  REFERRING MD :  Dr. Karleen Hampshire   CHIEF COMPLAINT:  Shortness of breath   INITIAL PRESENTATION:  64 yo female smoker with dyspnea, VDRF from pneumococcal PNA and septic shock.  She has hx of HTN, GERD, PAF, CMML, COPD.  STUDIES:  9/05  CT Chest w/o >> diffuse bilateral airspace disease, consolidation involving all lobes of L/R lung, several discrete nodules present in upper lobes, bulky mediastinal adenopathy  SIGNIFICANT EVENTS: 9/05  Admit with worsening SOB  9/06 intubated for hypoxia while receiving fluid bolus ? Edema, levo gtt 9/10 Extubated, transfuse 1 unit PRBC 9/11 A fib with RVR >> started amiodarone/levophed, respiratory failure >> re-intubated 9/12 mottled (hx of Raynauds) ECHO pending. Appropriate. Scr bumped.   SUBJECTIVE:  No distress, awake and following commands, on vent.   VITAL SIGNS: Temp:  [97.2 F (36.2 C)-99.4 F (37.4 C)] 98.3 F (36.8 C) (09/13 0347) Pulse Rate:  [86-117] 103 (09/13 0815) Resp:  [13-28] 22 (09/13 0815) BP: (74-146)/(43-77) 120/60 mmHg (09/13 0815) SpO2:  [93 %-99 %] 97 % (09/13 0753) FiO2 (%):  [30 %] 30 % (09/13 0802) Weight:  [76.7 kg (169 lb 1.5 oz)] 76.7 kg (169 lb 1.5 oz) (09/13 0452)  CVP:  [8 mmHg-16 mmHg] 12 mmHg   VENTILATOR SETTINGS: Vent Mode:  [-] PRVC FiO2 (%):  [30 %] 30 % Set Rate:  [28 bmp] 28 bmp Vt Set:  [500 mL] 500 mL PEEP:  [5 cmH20] 5 cmH20 Plateau Pressure:  [19 cmH20-22 cmH20] 19 cmH20   INTAKE / OUTPUT:  Intake/Output Summary (Last 24 hours) at 12/30/14 0826 Last data filed at 12/30/14 0700  Gross per 24 hour  Intake 3582.88 ml  Output   2125 ml  Net 1457.88 ml    PHYSICAL EXAMINATION: General: awake, orally intubated.  Neuro: Alert, following commands HEENT:  ETT in place Cardiovascular:  Regular, systolic murmur Lungs: basilar crackles, no  accessory muscle use  Abdomen:  Ileostomy, mid-line old abd wound dressing Musculoskeletal: no edema Skin: no rash   LABS:  CBC  Recent Labs Lab 12/28/14 0415 12/29/14 0445 12/30/14 0450  WBC 15.6* 12.9* 12.8*  HGB 9.7* 8.2* 8.1*  HCT 30.2* 24.0* 24.3*  PLT 182 193 200   BMET  Recent Labs Lab 12/28/14 0415 12/29/14 0445 12/30/14 0450  NA 137 134* 135  K 4.0 2.8* 3.2*  CL 93* 93* 96*  CO2 32 31 28  BUN 45* 43* 47*  CREATININE 1.25* 1.32* 1.52*  GLUCOSE 118* 150* 128*   Electrolytes  Recent Labs Lab 12/26/14 0445  12/27/14 0420 12/28/14 0415 12/29/14 0445 12/30/14 0450  CALCIUM 7.2*  < > 7.5* 8.4* 7.5* 7.6*  MG 1.2*  < > 1.9 1.7 1.4* 1.9  PHOS 3.6  --  3.4  --   --  3.3  < > = values in this interval not displayed.   Sepsis Markers  Recent Labs Lab 12/23/14 1830 12/23/14 2150 12/24/14 0536 12/25/14 0412  LATICACIDVEN 1.1  --   --   --   PROCALCITON  --  0.57 1.59 1.88   ABG  Recent Labs Lab 12/24/14 0427 12/26/14 1100 12/28/14 0710  PHART 7.282* 7.415 7.442  PCO2ART 39.5 54.1* 42.9  PO2ART 138* 49.8* 76.0*   Liver Enzymes  Recent Labs Lab 12/24/14 0536 12/25/14 0412 12/30/14 0450  AST 11* 10*  --  ALT 8* 8*  --   ALKPHOS 125 122  --   BILITOT 0.7 0.4  --   ALBUMIN 2.0* 2.0* 1.7*   Cardiac Enzymes  Recent Labs Lab 12/28/14 0415 12/28/14 1005 12/28/14 1700  TROPONINI <0.03 0.03 0.03     Glucose  Recent Labs Lab 12/29/14 2107 12/29/14 2119 12/29/14 2122 12/29/14 2333 12/29/14 2353 12/30/14 0345  GLUCAP 42* 221* 58* 119* 63* 118*    Imaging Dg Chest Port 1 View  12/30/2014   CLINICAL DATA:  Pulmonary infiltrates  EXAM: PORTABLE CHEST - 1 VIEW  COMPARISON:  December 29, 2014  FINDINGS: Endotracheal tube tip is 3.2 cm above the carina. Central catheter tip is in the superior vena cava. Nasogastric tube tip and side port below the diaphragm. No pneumothorax.  There are persistent bilateral pleural effusions with  diffuse interstitial edema. Patchy alveolar opacity in the lung bases may reflect alveolar edema. Heart is mildly enlarged with pulmonary vascularity within normal limits given semi rec positioning. No adenopathy appreciable.  IMPRESSION: Tube and catheter positions as described without pneumothorax. The appearance is consistent with a degree of congestive heart failure. Superimposed pneumonia and/ or ARDS is also possible. More than one of these entities may exist concurrently.   Electronically Signed   By: Lowella Grip III M.D.   On: 12/30/2014 07:06  PCXR:   ASSESSMENT / PLAN:  PULMONARY ETT 9/06 >> 9/10 ETT 9/11 >>  A: Acute hypoxic respiratory failure 2nd to Streptococcal PNA and pulmonary edema. B/l nodular infiltrates with mediastinal LAN s/p bronch 9/07. Tobacco abuse with presumed COPD. CXR with worsening bilat effusions and CHF 9/13  P:   Diagnostic/Therapeutic Thora Daily Weaning trials  Scheduled duoneb and budesonide Oxygen to keep SpO2 > 92%  CARDIOVASCULAR Rt PICC 9/06 >> A:  A-Fib with RVR- converted to NSR. Currently ST.  Septic shock 2nd to Streptococcal PNA. Hx of hypertension. P:  D/c amiodarone. AF likely stress related Wean off pressors to keep MAP > 65 Continue ASA Added heparin gtt per pharmacy- HOLD for pending thora  Consider Coumadin vs. NOAC in future   RENAL A:   AKI 2nd to sepsis, hypovolemia >> had resolved. Now a little worse. May be r/t hypotension   Metabolic acidosis >> resolved. Hypervolemia Hypokalemia P:   Keep euvolemic  Replace K Hold Lasix due to bump in Cr F/u chem in AM  GASTROINTESTINAL A:   Chronic diarrhea with C diff colonization. Hx of GERD. Nutrition. P:   Tube feeds while on vent Zantac for SUP  HEMATOLOGIC A:   Hx of CMML with chronic anemia. P:  F/u CBC  INFECTIOUS A:   Septic shock 2nd to Streptococcal pneumonia, Strep Bacteremia? As only 1/2 cultures  P:   Day 9 of Abx, currently on  fortaz Repeat BCx2 9/13  BCx2 9/5 >> strep viridans  (? Contaminant vs real)  C-Diff 9/6 >> colonization   ENDOCRINE A: Hyperglycemia. P: Monitor blood sugars  NEUROLOGY A: Chronic pain. P: RASS goal -1  No distress on current regimen. Thoracentesis to drain effusion. Repeat BCx2. F/u CXR and labs in [place] ACNP-BC [place] Pager # [phone] OR # [phone] if no answer

## 2014-12-31 ENCOUNTER — Inpatient Hospital Stay (HOSPITAL_COMMUNITY): Payer: 59

## 2014-12-31 DIAGNOSIS — J90 Pleural effusion, not elsewhere classified: Secondary | ICD-10-CM | POA: Insufficient documentation

## 2014-12-31 LAB — CBC WITH DIFFERENTIAL/PLATELET
BASOS PCT: 0 %
Basophils Absolute: 0 10*3/uL (ref 0.0–0.1)
EOS ABS: 0 10*3/uL (ref 0.0–0.7)
EOS PCT: 0 %
HEMATOCRIT: 22.9 % — AB (ref 36.0–46.0)
HEMOGLOBIN: 7.3 g/dL — AB (ref 12.0–15.0)
LYMPHS PCT: 23 %
Lymphs Abs: 2.7 10*3/uL (ref 0.7–4.0)
MCH: 32.3 pg (ref 26.0–34.0)
MCHC: 31.9 g/dL (ref 30.0–36.0)
MCV: 101.3 fL — ABNORMAL HIGH (ref 78.0–100.0)
MONO ABS: 2.7 10*3/uL — AB (ref 0.1–1.0)
Monocytes Relative: 23 %
NEUTROS ABS: 6.5 10*3/uL (ref 1.7–7.7)
NEUTROS PCT: 54 %
Platelets: 190 10*3/uL (ref 150–400)
RBC: 2.26 MIL/uL — ABNORMAL LOW (ref 3.87–5.11)
RDW: 18.7 % — AB (ref 11.5–15.5)
WBC: 11.9 10*3/uL — ABNORMAL HIGH (ref 4.0–10.5)

## 2014-12-31 LAB — CBC
HEMATOCRIT: 22.7 % — AB (ref 36.0–46.0)
Hemoglobin: 7.3 g/dL — ABNORMAL LOW (ref 12.0–15.0)
MCH: 32.9 pg (ref 26.0–34.0)
MCHC: 32.2 g/dL (ref 30.0–36.0)
MCV: 102.3 fL — ABNORMAL HIGH (ref 78.0–100.0)
Platelets: 207 10*3/uL (ref 150–400)
RBC: 2.22 MIL/uL — ABNORMAL LOW (ref 3.87–5.11)
RDW: 18.8 % — AB (ref 11.5–15.5)
WBC: 14 10*3/uL — ABNORMAL HIGH (ref 4.0–10.5)

## 2014-12-31 LAB — BASIC METABOLIC PANEL
Anion gap: 8 (ref 5–15)
BUN: 42 mg/dL — AB (ref 6–20)
CALCIUM: 7 mg/dL — AB (ref 8.9–10.3)
CHLORIDE: 103 mmol/L (ref 101–111)
CO2: 23 mmol/L (ref 22–32)
CREATININE: 1.38 mg/dL — AB (ref 0.44–1.00)
GFR calc non Af Amer: 39 mL/min — ABNORMAL LOW (ref >60)
GFR, EST AFRICAN AMERICAN: 45 mL/min — AB (ref >60)
GLUCOSE: 122 mg/dL — AB (ref 65–99)
Potassium: 4.1 mmol/L (ref 3.5–5.1)
Sodium: 134 mmol/L — ABNORMAL LOW (ref 135–145)

## 2014-12-31 LAB — COMPREHENSIVE METABOLIC PANEL
ALBUMIN: 1.6 g/dL — AB (ref 3.5–5.0)
ALK PHOS: 117 U/L (ref 38–126)
ALT: 9 U/L — ABNORMAL LOW (ref 14–54)
ANION GAP: 9 (ref 5–15)
AST: 14 U/L — ABNORMAL LOW (ref 15–41)
BILIRUBIN TOTAL: 0.3 mg/dL (ref 0.3–1.2)
BUN: 42 mg/dL — ABNORMAL HIGH (ref 6–20)
CALCIUM: 7 mg/dL — AB (ref 8.9–10.3)
CO2: 23 mmol/L (ref 22–32)
Chloride: 103 mmol/L (ref 101–111)
Creatinine, Ser: 1.37 mg/dL — ABNORMAL HIGH (ref 0.44–1.00)
GFR calc non Af Amer: 40 mL/min — ABNORMAL LOW (ref >60)
GFR, EST AFRICAN AMERICAN: 46 mL/min — AB (ref >60)
GLUCOSE: 125 mg/dL — AB (ref 65–99)
Potassium: 3.9 mmol/L (ref 3.5–5.1)
Sodium: 135 mmol/L (ref 135–145)
TOTAL PROTEIN: 4.1 g/dL — AB (ref 6.5–8.1)

## 2014-12-31 LAB — HEPARIN LEVEL (UNFRACTIONATED)
HEPARIN UNFRACTIONATED: 0.82 [IU]/mL — AB (ref 0.30–0.70)
HEPARIN UNFRACTIONATED: 0.22 [IU]/mL — AB (ref 0.30–0.70)
Heparin Unfractionated: 0.73 IU/mL — ABNORMAL HIGH (ref 0.30–0.70)

## 2014-12-31 LAB — HEMOGLOBIN AND HEMATOCRIT, BLOOD
HEMATOCRIT: 23.1 % — AB (ref 36.0–46.0)
HEMOGLOBIN: 7.4 g/dL — AB (ref 12.0–15.0)

## 2014-12-31 LAB — LIPASE, BLOOD: LIPASE: 14 U/L — AB (ref 22–51)

## 2014-12-31 LAB — TRIGLYCERIDES: Triglycerides: 182 mg/dL — ABNORMAL HIGH (ref <150)

## 2014-12-31 LAB — GLUCOSE, CAPILLARY
GLUCOSE-CAPILLARY: 112 mg/dL — AB (ref 65–99)
GLUCOSE-CAPILLARY: 81 mg/dL (ref 65–99)
GLUCOSE-CAPILLARY: 88 mg/dL (ref 65–99)
GLUCOSE-CAPILLARY: 96 mg/dL (ref 65–99)
Glucose-Capillary: 124 mg/dL — ABNORMAL HIGH (ref 65–99)
Glucose-Capillary: 97 mg/dL (ref 65–99)
Glucose-Capillary: 21 mg/dL — CL (ref 65–99)
Glucose-Capillary: 84 mg/dL (ref 65–99)

## 2014-12-31 LAB — RENAL FUNCTION PANEL
ALBUMIN: 1.5 g/dL — AB (ref 3.5–5.0)
Anion gap: 9 (ref 5–15)
BUN: 44 mg/dL — AB (ref 6–20)
CALCIUM: 7.1 mg/dL — AB (ref 8.9–10.3)
CO2: 25 mmol/L (ref 22–32)
Chloride: 101 mmol/L (ref 101–111)
Creatinine, Ser: 1.44 mg/dL — ABNORMAL HIGH (ref 0.44–1.00)
GFR calc Af Amer: 43 mL/min — ABNORMAL LOW (ref >60)
GFR calc non Af Amer: 37 mL/min — ABNORMAL LOW (ref >60)
GLUCOSE: 112 mg/dL — AB (ref 65–99)
PHOSPHORUS: 3.8 mg/dL (ref 2.5–4.6)
Potassium: 3.2 mmol/L — ABNORMAL LOW (ref 3.5–5.1)
SODIUM: 135 mmol/L (ref 135–145)

## 2014-12-31 LAB — RHEUMATOID FACTORS, FLUID: Rheumatoid Arthritis, Qn/Fluid: NEGATIVE

## 2014-12-31 LAB — LACTIC ACID, PLASMA: Lactic Acid, Venous: 0.8 mmol/L (ref 0.5–2.0)

## 2014-12-31 LAB — MAGNESIUM
Magnesium: 1.6 mg/dL — ABNORMAL LOW (ref 1.7–2.4)
Magnesium: 2.2 mg/dL (ref 1.7–2.4)

## 2014-12-31 LAB — LACTATE DEHYDROGENASE: LDH: 191 U/L (ref 98–192)

## 2014-12-31 MED ORDER — HEPARIN (PORCINE) IN NACL 100-0.45 UNIT/ML-% IJ SOLN
800.0000 [IU]/h | INTRAMUSCULAR | Status: DC
  Administered 2014-12-31: 800 [IU]/h via INTRAVENOUS
  Filled 2014-12-31: qty 250

## 2014-12-31 MED ORDER — HEPARIN (PORCINE) IN NACL 100-0.45 UNIT/ML-% IJ SOLN
850.0000 [IU]/h | INTRAMUSCULAR | Status: DC
  Filled 2014-12-31: qty 250

## 2014-12-31 MED ORDER — HEPARIN (PORCINE) IN NACL 100-0.45 UNIT/ML-% IJ SOLN
750.0000 [IU]/h | INTRAMUSCULAR | Status: DC
  Administered 2014-12-31: 750 [IU]/h via INTRAVENOUS
  Filled 2014-12-31: qty 250

## 2014-12-31 MED ORDER — DEXTROSE 5 % IV SOLN
3.0000 g | Freq: Once | INTRAVENOUS | Status: AC
  Administered 2014-12-31: 3 g via INTRAVENOUS
  Filled 2014-12-31: qty 6

## 2014-12-31 MED ORDER — DEXTROSE 50 % IV SOLN
INTRAVENOUS | Status: AC
  Administered 2014-12-31: 50 mL
  Filled 2014-12-31: qty 50

## 2014-12-31 MED ORDER — POTASSIUM CHLORIDE 20 MEQ/15ML (10%) PO SOLN
40.0000 meq | ORAL | Status: AC
  Administered 2014-12-31 (×2): 40 meq
  Filled 2014-12-31 (×2): qty 30

## 2014-12-31 MED ORDER — ALUM & MAG HYDROXIDE-SIMETH 200-200-20 MG/5ML PO SUSP
30.0000 mL | Freq: Once | ORAL | Status: AC
  Administered 2014-12-31: 30 mL
  Filled 2014-12-31: qty 30

## 2014-12-31 MED ORDER — FUROSEMIDE 10 MG/ML IJ SOLN
20.0000 mg | Freq: Once | INTRAMUSCULAR | Status: AC
  Administered 2014-12-31: 20 mg via INTRAVENOUS
  Filled 2014-12-31: qty 2

## 2014-12-31 NOTE — Progress Notes (Signed)
[facility] Physician-Brief Progress Note Patient Name: [name] DOB: 06-Aug-1950 MRN: [id number]   Date of Service  12/31/2014  HPI/Events of Note  abdo neg, all labs neg hct not changed Pt rrports thinnks has indegestion  eICU Interventions  malocx     Intervention Category Intermediate Interventions: Pain - evaluation and management  Donica Derouin J. 12/31/2014, 7:44 PM

## 2014-12-31 NOTE — Progress Notes (Signed)
Called [facility] to inform Dr. Titus Mould about results from [place] and labs.  [facility] said they would call back in a few minutes.  Irven Baltimore, RN

## 2014-12-31 NOTE — Progress Notes (Signed)
Spoke with Dr. Titus Mould about abd [place] and lab results. See new order.  Irven Baltimore, RN

## 2014-12-31 NOTE — Progress Notes (Addendum)
Pt c/o of new abd pain midline.  No visual signs of bleeding noted. Abd [place] and labs ordered.  Informed Dr. Titus Mould at [facility].  See new orders.  Irven Baltimore, RN

## 2014-12-31 NOTE — Progress Notes (Signed)
Nutrition Follow-up  INTERVENTION:   Continue Vital HP @ 20 ml/hr via OGT and increase by 10 ml every 4 hours to goal rate of 50 ml/hr.   Tube feeding regimen + current Propofol infusion provides 1374 kcal (96% of needs), 105 grams of protein, and 1003 ml of H2O.   RD to continue to monitor  NUTRITION DIAGNOSIS:   Inadequate oral intake related to inability to eat as evidenced by NPO status.  Ongoing.  GOAL:   Patient will meet greater than or equal to 90% of their needs  Meeting.  MONITOR:   Vent status, TF tolerance, Weight trends, Labs, I & O's  ASSESSMENT:   64 y.o. female with a past medical history of COPD, cigarette smoker, history of multiple pneumonias, history of C. difficile, history of partial colectomy and ileostomy, paroxysmal atrial fibrillation hypertension, chronic myelocytic monoclonal leukemia, chronic anemia that requires Epogen injections and occasional blood transfusions, MRSA carrier, GERD who comes to the emergency department due to worsening shortness of breath, pleuritic chest pain, fatigue, cold sweats for several days. She has also been having occasionally productive cough of mucus, decreased appetite, difficulty sleeping and dehydration due to high ileostomy output with decreased oral intake.   Patient is currently intubated on ventilator support MV: 13 L/min Temp (24hrs), Avg:98.7 F (37.1 C), Min:98.1 F (36.7 C), Max:99.4 F (37.4 C)  Propofol: 6.59ml/hr-> provides 174 fat kcals  Labs reviewed: Low K, Mg Elevated BUN & Creatinine Phos WNL  Diet Order:  Diet NPO time specified  Skin:  Wound (see comment) (abdominal incision)  Last BM:  9/13  Height:   Ht Readings from Last 1 Encounters:  12/30/14 5\' 5"  (1.651 m)    Weight:   Wt Readings from Last 1 Encounters:  12/31/14 171 lb 1.2 oz (77.6 kg)    Ideal Body Weight:  56.82 kg (kg)  BMI:  Body mass index is 28.47 kg/(m^2).  Estimated Nutritional Needs:   Kcal:   1425  Protein:  90-100g  Fluid:  1.5L/day  EDUCATION NEEDS:   No education needs identified at this time  Clayton Bibles, MS, RD, LDN Pager: [phone] After Hours Pager: [phone]

## 2014-12-31 NOTE — Progress Notes (Signed)
ANTICOAGULATION CONSULT NOTE - Follow Up Consult  Pharmacy Consult for Heparin Indication: atrial fibrillation  Allergies  Allergen Reactions  . Vancomycin Hives and Rash    wheezing  . Ativan [Lorazepam] Other (See Comments)    "makes me crazy"  . Codeine Nausea And Vomiting  . Digoxin And Related Swelling  . Tetanus Toxoids Other (See Comments)    Serum reaction  . Penicillins Hives and Rash  . Xarelto [Rivaroxaban] Hives and Rash    (May not be allergic. Vancomycin was also being taken when reaction occurred.)    Patient Measurements: Height: 5\' 5"  (165.1 cm) Weight: 171 lb 1.2 oz (77.6 kg) IBW/kg (Calculated) : 57 Heparin Dosing Weight:   Vital Signs: Temp: 98 F (36.7 C) (09/14 1950) Temp Source: Axillary (09/14 1950) BP: 118/73 mmHg (09/14 2100) Pulse Rate: 119 (09/14 1922)  Labs:  Recent Labs  12/30/14 0450 12/31/14 0530 12/31/14 0535 12/31/14 1430 12/31/14 1830 12/31/14 2200  HGB 8.1*  --  7.3* 7.4* 7.3*  --   HCT 24.3*  --  22.9* 23.1* 22.7*  --   PLT 200  --  190  --  207  --   HEPARINUNFRC 0.50 0.82*  --  0.73*  --  0.22*  CREATININE 1.52*  --  1.44* 1.38* 1.37*  --     Estimated Creatinine Clearance: 42.1 mL/min (by C-G formula based on Cr of 1.37).   Medications:  Infusions:  . sodium chloride 100 mL/hr at 12/31/14 0852  . feeding supplement (VITAL HIGH PROTEIN) 1,000 mL (12/31/14 1237)  . heparin    . norepinephrine (LEVOPHED) Adult infusion 3 mcg/min (12/31/14 2300)  . phenylephrine (NEO-SYNEPHRINE) Adult infusion Stopped (12/28/14 0846)  . propofol (DIPRIVAN) infusion 30 mcg/kg/min (12/31/14 1700)    Assessment: Patient with low heparin level now.  No heparin issues per RN.  Goal of Therapy:  Heparin level 0.3-0.7 units/ml Monitor platelets by anticoagulation protocol: Yes   Plan:  Increase heparin to 800 units/hr Recheck level at 0800  Nani Skillern Crowford 12/31/2014,11:29 PM

## 2014-12-31 NOTE — Progress Notes (Signed)
eLink Physician-Brief Progress Note Patient Name: [name] DOB: 12/26/1950 MRN: [id number]   Date of Service  12/31/2014  HPI/Events of Note  New left abdo pain, no brusiing, on hep No changes in HR, BP  eICU Interventions  Assess kub,. Cbc, lip, ldh, ldh, lft     Intervention Category Intermediate Interventions: Abdominal pain - evaluation and management  FEINSTEIN,DANIEL J. 12/31/2014, 5:39 PM

## 2014-12-31 NOTE — Progress Notes (Signed)
PULMONARY / CRITICAL CARE MEDICINE   Name: [name] MRN: [id number] DOB: Jul 24, 1950    ADMISSION DATE:  01/15/2015 CONSULTATION DATE:  12/23/2014  REFERRING MD :  Dr. Karleen Hampshire   CHIEF COMPLAINT:  Shortness of breath   INITIAL PRESENTATION:  64 yo female smoker with dyspnea, VDRF from pneumococcal PNA and septic shock.  She has hx of HTN, GERD, PAF, CMML, COPD.  STUDIES:  9/05  CT Chest w/o >> diffuse bilateral airspace disease, consolidation involving all lobes of L/R lung, several discrete nodules present in upper lobes, bulky mediastinal adenopathy  SIGNIFICANT EVENTS: 9/05  Admit with worsening SOB  9/06 intubated for hypoxia while receiving fluid bolus ? Edema, levo gtt 9/10 Extubated, transfuse 1 unit PRBC 9/11 A fib with RVR >> started amiodarone/levophed, respiratory failure >> re-intubated 9/12 mottled (hx of Raynauds) ECHO pending. Appropriate. Scr bumped.  9/13 Diagnostic/Therapeutic right Thoracentesis. 650 ML clear appearing. Transudate by Light Criteria   9/14 lasix for neg volume, weaning.   SUBJECTIVE:  No distress, awake and following commands, on vent.   VITAL SIGNS: Temp:  [98.2 F (36.8 C)-99.4 F (37.4 C)] 98.7 F (37.1 C) (09/14 0400) Pulse Rate:  [96-111] 105 (09/14 0800) Resp:  [21-33] 25 (09/14 0800) BP: (87-127)/(42-87) 114/65 mmHg (09/14 0800) SpO2:  [96 %-100 %] 100 % (09/14 0800) FiO2 (%):  [30 %] 30 % (09/14 0431) Weight:  [77.6 kg (171 lb 1.2 oz)] 77.6 kg (171 lb 1.2 oz) (09/14 0400)  CVP:  [10 mmHg-13 mmHg] 10 mmHg   VENTILATOR SETTINGS: Vent Mode:  [-] PRVC FiO2 (%):  [30 %] 30 % Set Rate:  [28 bmp] 28 bmp Vt Set:  [500 mL] 500 mL PEEP:  [5 cmH20] 5 cmH20 Pressure Support:  [8 cmH20] 8 cmH20 Plateau Pressure:  [19 cmH20-23 cmH20] 23 cmH20   INTAKE / OUTPUT:  Intake/Output Summary (Last 24 hours) at 12/31/14 0807 Last data filed at 12/31/14 0700  Gross per 24 hour  Intake 5394.75 ml  Output   2170 ml  Net 3224.75 ml     PHYSICAL EXAMINATION: General: awake, orally intubated.  Neuro: Alert, following commands HEENT:  NCAT. ETT in place Cardiovascular:  RRR, systolic murmur Lungs: bilateral crackles, no accessory muscle use  Abdomen:  Ileostomy, mid-line old abd wound dressing. Abd soft, non-tender  Musculoskeletal: Mild peripheral edema bilaterally  Skin: no rash   LABS:  CBC  Recent Labs Lab 12/29/14 0445 12/30/14 0450 12/31/14 0535  WBC 12.9* 12.8* 11.9*  HGB 8.2* 8.1* 7.3*  HCT 24.0* 24.3* 22.9*  PLT 193 200 190   BMET  Recent Labs Lab 12/29/14 0445 12/30/14 0450 12/31/14 0535  NA 134* 135 135  K 2.8* 3.2* 3.2*  CL 93* 96* 101  CO2 31 28 25   BUN 43* 47* 44*  CREATININE 1.32* 1.52* 1.44*  GLUCOSE 150* 128* 112*   Electrolytes  Recent Labs Lab 12/27/14 0420  12/29/14 0445 12/30/14 0450 12/31/14 0535  CALCIUM 7.5*  < > 7.5* 7.6* 7.1*  MG 1.9  < > 1.4* 1.9 1.6*  PHOS 3.4  --   --  3.3 3.8  < > = values in this interval not displayed.   Sepsis Markers  Recent Labs Lab 12/25/14 0412  PROCALCITON 1.88   ABG  Recent Labs Lab 12/26/14 1100 12/28/14 0710  PHART 7.415 7.442  PCO2ART 54.1* 42.9  PO2ART 49.8* 76.0*   Liver Enzymes  Recent Labs Lab 12/25/14 0412 12/30/14 0450 12/31/14 0535  AST 10*  --   --  ALT 8*  --   --   ALKPHOS 122  --   --   BILITOT 0.4  --   --   ALBUMIN 2.0* 1.7* 1.5*   Cardiac Enzymes  Recent Labs Lab 12/28/14 0415 12/28/14 1005 12/28/14 1700  TROPONINI <0.03 0.03 0.03     Glucose  Recent Labs Lab 12/30/14 0834 12/30/14 1310 12/30/14 1818 12/30/14 2010 12/31/14 0018 12/31/14 0408  GLUCAP 202* 139* 84 93 96 88    Imaging Dg Chest Port 1 View  12/31/2014   CLINICAL DATA:  Intubation.  Pneumonia.  EXAM: PORTABLE CHEST - 1 VIEW  COMPARISON:  12/30/2014.  FINDINGS: Endotracheal tube, NG tube, right PICC line in stable position. Mild cardiomegaly. Diffuse bilateral pulmonary infiltrates with pleural effusions  consistent congestive heart failure. Bilateral pneumonia cannot be excluded. Slight interim worsening. No pneumothorax.  IMPRESSION: 1. Lines and tubes in stable position. 2. Mild cardiomegaly with diffuse bilateral pulmonary alveolar infiltrates and pleural effusions consistent with congestive heart failure. Bilateral pneumonia cannot be excluded. Slight interval worsening.   Electronically Signed   By: Marcello Moores  Register   On: 12/31/2014 07:26   Dg Chest Port 1 View  12/30/2014   CLINICAL DATA:  Bilateral pleural effusions.  Post thoracentesis  EXAM: PORTABLE CHEST - 1 VIEW  COMPARISON:  12/30/2014  FINDINGS: Small to moderate bilateral pleural effusions, similar to prior study. No pneumothorax. Diffuse bilateral airspace disease again noted, not significantly changed. Heart is mildly enlarged. Endotracheal tube, right central line and NG tube are unchanged.  IMPRESSION: Small to moderate bilateral effusions are unchanged. No pneumothorax.  Diffuse bilateral airspace disease, likely edema, stable.   Electronically Signed   By: Rolm Baptise M.D.   On: 12/30/2014 13:31  PCXR:   ASSESSMENT / PLAN:  PULMONARY ETT 9/06 >> 9/10 ETT 9/11 >>  A: Acute hypoxic respiratory failure 2nd to Streptococcal PNA and pulmonary edema. B/l nodular infiltrates with mediastinal LAN s/p bronch 9/07. Tobacco abuse with presumed COPD. Worsening pulmonary edema 9/13   P:   Lasix 20 mg X1 Daily Weaning trials  Scheduled duoneb and budesonide Oxygen to keep SpO2 > 92%  CARDIOVASCULAR Rt PICC 9/06 >> A:  A-Fib with RVR- converted to NSR. Currently ST.  Septic shock 2nd to Streptococcal PNA. Hx of hypertension. P:  Wean off pressors to keep MAP > 65 Continue ASA Added heparin gtt per pharmacy  Consider Coumadin vs. NOAC in future   RENAL A:   AKI 2nd to sepsis, hypovolemia >> had resolved. Now a little worse. May be r/t hypotension   Metabolic acidosis >> resolved. Hypervolemia Hypokalemia Hypomagnesemia   P:   Lasix x1 Keep euvolemic  Replace K Replace Mag F/u chem in AM  GASTROINTESTINAL A:   Chronic diarrhea with C diff colonization. Hx of GERD. Nutrition. P:   Tube feeds while on vent Zantac for SUP  HEMATOLOGIC A:   Hx of CMML with chronic anemia. P:  F/u CBC  INFECTIOUS A:   Septic shock 2nd to Streptococcal pneumonia, Strep Bacteremia? As only 1/2 cultures  P:   Day 10 of Abx, currently on fortaz (d/c 9/14) Pt refused redraw of blood cultures   BCx2 9/5 >> strep viridans  (? Contaminant vs real)  C-Diff 9/6 >> colonization   ENDOCRINE A: Hyperglycemia. P: Monitor blood sugars  NEUROLOGY A: Chronic pain. P: RASS goal -1  No distress on current regimen. Thoracentesis to drain effusion 9/13 without much clinical improvement. Lasix w/ hope to get negative vol  status.  f/u CXR and labs in AM. Hope repeat trial of extubation next 24hrs DC abx today.    Erick Colace ACNP-BC [place] Pager # [phone] OR # [phone] if no answer

## 2014-12-31 NOTE — Progress Notes (Signed)
eLink Physician-Brief Progress Note Patient Name: Nhung Danko DOB: 1951-02-05 MRN: [id number]   Date of Service  12/31/2014  HPI/Events of Note  hypokalemia  eICU Interventions  Potassium replaced     Intervention Category Intermediate Interventions: Electrolyte abnormality - evaluation and management  Troyce Febo 12/31/2014, 6:36 AM

## 2014-12-31 NOTE — Progress Notes (Signed)
ANTICOAGULATION CONSULT NOTE - Initial Consult  Pharmacy Consult for heparin Indication: atrial fibrillation  Allergies  Allergen Reactions  . Vancomycin Hives and Rash    wheezing  . Ativan [Lorazepam] Other (See Comments)    "makes me crazy"  . Codeine Nausea And Vomiting  . Digoxin And Related Swelling  . Tetanus Toxoids Other (See Comments)    Serum reaction  . Penicillins Hives and Rash  . Xarelto [Rivaroxaban] Hives and Rash    (May not be allergic. Vancomycin was also being taken when reaction occurred.)    Patient Measurements: Height: 5\' 5"  (165.1 cm) Weight: 171 lb 1.2 oz (77.6 kg) IBW/kg (Calculated) : 57 Heparin Dosing Weight: 73kg  Vital Signs: Temp: 98.7 F (37.1 C) (09/14 0400) Temp Source: Axillary (09/14 0400) BP: 87/45 mmHg (09/14 0822) Pulse Rate: 99 (09/14 0822)  Labs:  Recent Labs  12/28/14 1005 12/28/14 1700  12/29/14 0445 12/29/14 0900 12/30/14 0450 12/31/14 0530 12/31/14 0535  HGB  --   --   < > 8.2*  --  8.1*  --  7.3*  HCT  --   --   --  24.0*  --  24.3*  --  22.9*  PLT  --   --   --  193  --  200  --  190  HEPARINUNFRC  --   --   < >  --  0.58 0.50 0.82*  --   CREATININE  --   --   --  1.32*  --  1.52*  --  1.44*  TROPONINI 0.03 0.03  --   --   --   --   --   --   < > = values in this interval not displayed.  Estimated Creatinine Clearance: 40.1 mL/min (by C-G formula based on Cr of 1.44).   Medical History: Past Medical History  Diagnosis Date  . Hypertension     She has a past hx of essential  . 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 Inflammation of joint of knee     Since we last last saw her she developed problems with an acute which required surgical drainage by her orthopedist Dr. Durward Fortes.  Marland Kitchen MRSA (methicillin resistant Staphylococcus aureus)     Knee surgery drainage was positive for MRSA and she was treated with 3 weeks of doxycycline  successfully.  . Diarrhea     Mild  . Exogenous obesity   . GERD (gastroesophageal reflux disease)     2 hosp.- ischemic colitis - residual of Norovirus, 05/2011- sm. bowel obstruction  . Headache(784.0)     migraine headache on occas, less now than when she was younger   . Arthritis     L hip, back, neck   . History of blood transfusion sept 2013    04/2011- /w ischemic colitis , trouble with matching blood  sept 2013  . Anemia     will see hematology consult prior to surgery, recommended by Dr. Mare Ferrari  . Anemia 12/15/2010  . Ischemic colitis 01/31/2012  . Atrial flutter     during hospitalization, 04/2011- related to anemia & illness/stress   . Pneumonia     04/2011- not hospitalized , pt. denies SOB, changes in chest, breathing  . 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 Dizziness     occasional  . B12 deficiency 02/27/2013  .  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 COPD (chronic obstructive pulmonary disease) 09/26/2013    Assessment:  64 year old female presents with increased shortness of breath over the last 4 days associated which chest pain. She's been treated multiple times for pneumonia with her primary care physician recently. She has been admitted to the hospital previously with ARDS and multifocal pneumonia. Patient known to pharmacy for antibiotic dosing.  The night on early 9/11 am patient developed afib w/ RVR.  Pharmacy asked to dose heparin gtt.   AFib CHADS2VASc = 3 Hx of Anemia and CMML  9/11 >> HL = 0.59 9/12 >> HL = 0.58 9/13 >> HL = 0.50 9/14 >> HL = 0.82, Hgb 7.3, PLT 190  Goal of Therapy:  Heparin level 0.3-0.7 units/ml Monitor platelets by anticoagulation protocol: Yes   Plan:  --Reduce dose to Heparin IV 850 units/hr --Recheck HL in 6h (@1400 ) --Daily CBC and HL   Viann Fish, PharmD candidate  12/31/2014,8:43 AM

## 2015-01-01 ENCOUNTER — Telehealth: Payer: Self-pay | Admitting: Cardiology

## 2015-01-01 ENCOUNTER — Inpatient Hospital Stay (HOSPITAL_COMMUNITY): Payer: 59

## 2015-01-01 DIAGNOSIS — J9601 Acute respiratory failure with hypoxia: Secondary | ICD-10-CM | POA: Insufficient documentation

## 2015-01-01 LAB — CBC WITH DIFFERENTIAL/PLATELET
Basophils Absolute: 0 10*3/uL (ref 0.0–0.1)
Basophils Relative: 0 %
EOS PCT: 1 %
Eosinophils Absolute: 0.2 10*3/uL (ref 0.0–0.7)
HEMATOCRIT: 21.9 % — AB (ref 36.0–46.0)
Hemoglobin: 7.1 g/dL — ABNORMAL LOW (ref 12.0–15.0)
Lymphocytes Relative: 19 %
Lymphs Abs: 3.3 10*3/uL (ref 0.7–4.0)
MCH: 33 pg (ref 26.0–34.0)
MCHC: 32.4 g/dL (ref 30.0–36.0)
MCV: 101.9 fL — ABNORMAL HIGH (ref 78.0–100.0)
MONO ABS: 3.8 10*3/uL — AB (ref 0.1–1.0)
MONOS PCT: 22 %
NEUTROS PCT: 58 %
Neutro Abs: 10 10*3/uL — ABNORMAL HIGH (ref 1.7–7.7)
Platelets: 239 10*3/uL (ref 150–400)
RBC: 2.15 MIL/uL — AB (ref 3.87–5.11)
RDW: 18.8 % — AB (ref 11.5–15.5)
WBC: 17.3 10*3/uL — AB (ref 4.0–10.5)

## 2015-01-01 LAB — RENAL FUNCTION PANEL
ALBUMIN: 1.5 g/dL — AB (ref 3.5–5.0)
ANION GAP: 9 (ref 5–15)
BUN: 47 mg/dL — ABNORMAL HIGH (ref 6–20)
CALCIUM: 7.5 mg/dL — AB (ref 8.9–10.3)
CO2: 24 mmol/L (ref 22–32)
Chloride: 103 mmol/L (ref 101–111)
Creatinine, Ser: 1.51 mg/dL — ABNORMAL HIGH (ref 0.44–1.00)
GFR calc non Af Amer: 35 mL/min — ABNORMAL LOW (ref >60)
GFR, EST AFRICAN AMERICAN: 41 mL/min — AB (ref >60)
Glucose, Bld: 127 mg/dL — ABNORMAL HIGH (ref 65–99)
PHOSPHORUS: 4.4 mg/dL (ref 2.5–4.6)
POTASSIUM: 3.8 mmol/L (ref 3.5–5.1)
SODIUM: 136 mmol/L (ref 135–145)

## 2015-01-01 LAB — GLUCOSE, CAPILLARY
GLUCOSE-CAPILLARY: 104 mg/dL — AB (ref 65–99)
GLUCOSE-CAPILLARY: 106 mg/dL — AB (ref 65–99)
GLUCOSE-CAPILLARY: 128 mg/dL — AB (ref 65–99)
GLUCOSE-CAPILLARY: 130 mg/dL — AB (ref 65–99)
Glucose-Capillary: 131 mg/dL — ABNORMAL HIGH (ref 65–99)
Glucose-Capillary: 118 mg/dL — ABNORMAL HIGH (ref 65–99)

## 2015-01-01 LAB — HEPARIN LEVEL (UNFRACTIONATED)
Heparin Unfractionated: 0.3 IU/mL (ref 0.30–0.70)
Heparin Unfractionated: 0.22 IU/mL — ABNORMAL LOW (ref 0.30–0.70)

## 2015-01-01 LAB — MAGNESIUM: Magnesium: 1.9 mg/dL (ref 1.7–2.4)

## 2015-01-01 LAB — TRIGLYCERIDES: Triglycerides: 159 mg/dL — ABNORMAL HIGH (ref <150)

## 2015-01-01 MED ORDER — FUROSEMIDE 10 MG/ML IJ SOLN
40.0000 mg | Freq: Once | INTRAMUSCULAR | Status: AC
  Administered 2015-01-01: 40 mg via INTRAVENOUS
  Filled 2015-01-01: qty 4

## 2015-01-01 MED ORDER — HEPARIN (PORCINE) IN NACL 100-0.45 UNIT/ML-% IJ SOLN
1000.0000 [IU]/h | INTRAMUSCULAR | Status: DC
  Administered 2015-01-01: 1000 [IU]/h via INTRAVENOUS
  Filled 2015-01-01: qty 250

## 2015-01-01 MED ORDER — HEPARIN (PORCINE) IN NACL 100-0.45 UNIT/ML-% IJ SOLN
850.0000 [IU]/h | INTRAMUSCULAR | Status: DC
  Filled 2015-01-01: qty 250

## 2015-01-01 MED ORDER — PANTOPRAZOLE SODIUM 40 MG PO PACK
40.0000 mg | PACK | Freq: Every day | ORAL | Status: DC
  Administered 2015-01-01 – 2015-01-02 (×2): 40 mg
  Filled 2015-01-01 (×3): qty 20

## 2015-01-01 MED ORDER — ALBUMIN HUMAN 25 % IV SOLN
12.5000 g | Freq: Once | INTRAVENOUS | Status: AC
Start: 2015-01-01 — End: 2015-01-01
  Administered 2015-01-01: 12.5 g via INTRAVENOUS
  Filled 2015-01-01: qty 50

## 2015-01-01 MED ORDER — OXYCODONE-ACETAMINOPHEN 5-325 MG PO TABS
2.0000 | ORAL_TABLET | ORAL | Status: DC | PRN
  Administered 2015-01-02 – 2015-01-03 (×3): 2
  Filled 2015-01-01 (×3): qty 2

## 2015-01-01 MED ORDER — NICOTINE 7 MG/24HR TD PT24
7.0000 mg | MEDICATED_PATCH | Freq: Every day | TRANSDERMAL | Status: DC
  Administered 2015-01-01 – 2015-01-06 (×6): 7 mg via TRANSDERMAL
  Filled 2015-01-01 (×6): qty 1

## 2015-01-01 NOTE — Progress Notes (Signed)
PULMONARY / CRITICAL CARE MEDICINE   Name: [name] MRN: [id number] DOB: Jul 18, 1950    ADMISSION DATE:  01/15/2015 CONSULTATION DATE:  12/23/2014  REFERRING MD :  Dr. Karleen Hampshire   CHIEF COMPLAINT:  Shortness of breath   INITIAL PRESENTATION:  64 yo female smoker with dyspnea, VDRF from pneumococcal PNA and septic shock.  She has hx of HTN, GERD, PAF, CMML, COPD.  STUDIES:  9/05  CT Chest w/o >> diffuse bilateral airspace disease, consolidation involving all lobes of L/R lung, several discrete nodules present in upper lobes, bulky mediastinal adenopathy  SIGNIFICANT EVENTS: 9/05  Admit with worsening SOB  9/06 intubated for hypoxia while receiving fluid bolus ? Edema, levo gtt 9/10 Extubated, transfuse 1 unit PRBC 9/11 A fib with RVR >> started amiodarone/levophed, respiratory failure >> re-intubated 9/12 mottled (hx of Raynauds) ECHO pending. Appropriate. Scr bumped.  9/13 Diagnostic/Therapeutic right Thoracentesis. 650 ML clear appearing. Transudate by Light Criteria   9/14 lasix for neg volume, weaning.  9/15   SUBJECTIVE:  No distress. Awakens to verbal stimulus, follows commands. ETT tube in place    VITAL SIGNS: Temp:  [97.5 F (36.4 C)-98.5 F (36.9 C)] 98.5 F (36.9 C) (09/15 0326) Pulse Rate:  [95-124] 100 (09/15 0600) Resp:  [22-34] 28 (09/15 0600) BP: (82-141)/(45-85) 115/60 mmHg (09/15 0600) SpO2:  [94 %-100 %] 98 % (09/15 0747) FiO2 (%):  [30 %] 30 % (09/15 0747) Weight:  [77.7 kg (171 lb 4.8 oz)] 77.7 kg (171 lb 4.8 oz) (09/15 0600)  CVP:  [10 mmHg-11 mmHg] 11 mmHg   VENTILATOR SETTINGS: Vent Mode:  [-] PRVC FiO2 (%):  [30 %] 30 % Set Rate:  [28 bmp] 28 bmp Vt Set:  [500 mL] 500 mL PEEP:  [5 cmH20] 5 cmH20 Pressure Support:  [8 cmH20] 8 cmH20 Plateau Pressure:  [20 cmH20-25 cmH20] 20 cmH20   INTAKE / OUTPUT:  Intake/Output Summary (Last 24 hours) at 01/01/15 0947 Last data filed at 01/01/15 0500  Gross per 24 hour  Intake 3212.2 ml  Output   2175 ml   Net 1037.2 ml    PHYSICAL EXAMINATION: General: awake, orally intubated.  Neuro: Alert, following commands HEENT:  NCAT. ETT in place Cardiovascular:  RRR, systolic murmur Lungs: clear, diminished, no accessory muscle use  Abdomen:  Ileostomy, mid-line old abd wound dressing. Abd soft, non-tender  Musculoskeletal: 2+ pitting edema bilat LE Skin: no rash   LABS:  CBC  Recent Labs Lab 12/31/14 0535 12/31/14 1430 12/31/14 1830 01/01/15 0500  WBC 11.9*  --  14.0* 17.3*  HGB 7.3* 7.4* 7.3* 7.1*  HCT 22.9* 23.1* 22.7* 21.9*  PLT 190  --  207 239   BMET  Recent Labs Lab 12/31/14 1430 12/31/14 1830 01/01/15 0500  NA 134* 135 136  K 4.1 3.9 3.8  CL 103 103 103  CO2 23 23 24   BUN 42* 42* 47*  CREATININE 1.38* 1.37* 1.51*  GLUCOSE 122* 125* 127*   Electrolytes  Recent Labs Lab 12/30/14 0450 12/31/14 0535 12/31/14 1430 12/31/14 1830 01/01/15 0500  CALCIUM 7.6* 7.1* 7.0* 7.0* 7.5*  MG 1.9 1.6* 2.2  --  1.9  PHOS 3.3 3.8  --   --  4.4     Sepsis Markers  Recent Labs Lab 12/31/14 1830  LATICACIDVEN 0.8   ABG  Recent Labs Lab 12/26/14 1100 12/28/14 0710  PHART 7.415 7.442  PCO2ART 54.1* 42.9  PO2ART 49.8* 76.0*   Liver Enzymes  Recent Labs Lab 12/31/14 0535 12/31/14 1830  01/01/15 0500  AST  --  14*  --   ALT  --  9*  --   ALKPHOS  --  117  --   BILITOT  --  0.3  --   ALBUMIN 1.5* 1.6* 1.5*   Cardiac Enzymes  Recent Labs Lab 12/28/14 0415 12/28/14 1005 12/28/14 1700  TROPONINI <0.03 0.03 0.03     Glucose  Recent Labs Lab 12/31/14 1605 12/31/14 1945 12/31/14 1947 12/31/14 2035 12/31/14 2351 01/01/15 0324  GLUCAP 112* 20* 21* 84 106* 130*    Imaging Dg Abd Portable 1v  12/31/2014   CLINICAL DATA:  Midline abdominal pain.  EXAM: PORTABLE ABDOMEN - 1 VIEW  COMPARISON:  Radiograph 12/28/2014  FINDINGS: NG tube extends the stomach. Surgical clips in the RIGHT abdomen. Colostomy over the lateral RIGHT abdomen. No dilated loops  of large or small bowel. There is bibasilar pulmonary opacities and small effusions.  IMPRESSION: 1. No acute abdominal findings. 2. NG tube in good position. 3. RIGHT lower quadrant colostomy without obstruction. 4. Bibasilar pulmonary opacities suggesting edema or infiltrate.   Electronically Signed   By: Suzy Bouchard M.D.   On: 12/31/2014 18:41  PCXR:   ASSESSMENT / PLAN:  PULMONARY ETT 9/06 >> 9/10 ETT 9/11 >>  A: Acute hypoxic respiratory failure 2nd to Streptococcal PNA and pulmonary edema  B/l nodular infiltrates with mediastinal LAN s/p bronch 9/07. Tobacco abuse with presumed COPD. Bilateral pulmonary effusions likely r/t CHF  F to VT OK but accessory muscle use & tachycardia which raises concern about ability to be extubated  P:   Continue PS trials as tolerates  Arrange for perc trach Scheduled duoneb and budesonide Oxygen to keep SpO2 > 92%  CARDIOVASCULAR Rt PICC 9/06 >> A:  A-Fib with RVR- converted to NSR. Currently ST.  Septic shock 2nd to Streptococcal PNA. Hx of hypertension. P:  Wean off pressors to keep MAP > 65 Continue ASA Heparin gtt per pharmacy  Consider Coumadin vs. NOAC in future   RENAL A:   AKI 2nd to sepsis, hypovolemia >> had resolved. Now a little worse. May be r/t hypotension   Metabolic acidosis >> resolved. Hypokalemia >> resolved Hypomagnesemia >> resolved Hypervolemia P:   Hold Lasix d/t tachycardia and increased need for pressors Keep euvolemic  F/u chem in AM  GASTROINTESTINAL A:   Chronic diarrhea with C diff colonization. Hx of GERD. Nutrition. P:   Tube feeds while on vent Zantac for SUP  HEMATOLOGIC A:   Hx of CMML with chronic anemia. P:  F/u CBC  INFECTIOUS A:   Septic shock 2nd to Streptococcal pneumonia, Strep Bacteremia? As only 1/2 cultures  P:   Fortaz 9/4 >> 9/14 BCx2 9/5 >> strep viridans  (? Contaminant vs real)  C-Diff 9/6 >> colonization   ENDOCRINE A: Hyperglycemia. P: Monitor blood  sugars  NEUROLOGY A: Chronic pain. P: RASS goal -1  Continue ventilatory support and will arrange for perc trach. Seems like pt's severe malnutrition and deconditioning are driving factors. Likely will need [place] ACNP-BC [place] Pager # [phone] OR # [phone] if no answer

## 2015-01-01 NOTE — Telephone Encounter (Signed)
Will forward to  Dr. Brackbill  

## 2015-01-01 NOTE — Progress Notes (Signed)
ANTICOAGULATION CONSULT NOTE - Follow Up Consult  Pharmacy Consult for Heparin Indication: atrial fibrillation  Allergies  Allergen Reactions  . Vancomycin Hives and Rash    wheezing  . Ativan [Lorazepam] Other (See Comments)    "makes me crazy"  . Codeine Nausea And Vomiting  . Digoxin And Related Swelling  . Tetanus Toxoids Other (See Comments)    Serum reaction  . Penicillins Hives and Rash  . Xarelto [Rivaroxaban] Hives and Rash    (May not be allergic. Vancomycin was also being taken when reaction occurred.)    Patient Measurements: Height: 5\' 5"  (165.1 cm) Weight: 171 lb 4.8 oz (77.7 kg) IBW/kg (Calculated) : 57 Heparin Dosing Weight:   Vital Signs: Temp: 98.5 F (36.9 C) (09/15 0326) Temp Source: Axillary (09/15 0326) BP: 115/60 mmHg (09/15 0600) Pulse Rate: 100 (09/15 0600)  Labs:  Recent Labs  12/31/14 0535 12/31/14 1430 12/31/14 1830 12/31/14 2200 01/01/15 0500 01/01/15 0940  HGB 7.3* 7.4* 7.3*  --  7.1*  --   HCT 22.9* 23.1* 22.7*  --  21.9*  --   PLT 190  --  207  --  239  --   HEPARINUNFRC  --  0.73*  --  0.22*  --  0.30  CREATININE 1.44* 1.38* 1.37*  --  1.51*  --     Estimated Creatinine Clearance: 38.3 mL/min (by C-G formula based on Cr of 1.51).   Medications:  Infusions:  . sodium chloride 100 mL/hr at 12/31/14 0852  . feeding supplement (VITAL HIGH PROTEIN) 1,000 mL (01/01/15 0935)  . heparin    . norepinephrine (LEVOPHED) Adult infusion 6 mcg/min (01/01/15 1000)  . phenylephrine (NEO-SYNEPHRINE) Adult infusion Stopped (12/28/14 0846)  . propofol (DIPRIVAN) infusion 35 mcg/kg/min (01/01/15 0600)    Assessment: 64 year old female presents with increased shortness of breath over the last 4 days associated which chest pain. She's been treated multiple times for pneumonia with her primary care physician recently. She has been admitted to the hospital previously with ARDS and multifocal pneumonia. Patient known to pharmacy for antibiotic  dosing. The night on early 9/11 am patient developed afib w/ RVR. Pharmacy asked to dose heparin gtt.   AFib CHADS2VASc = 3 Hx of Anemia and CMML  9/11 >> HL = 0.59 9/12 >> HL = 0.58 9/13 >> HL = 0.50 9/14 >> HL = 0.82 on 1000 units/hr 9/14 >> HL = 0.73 on 850 units/hr 9/14 >> HL = 0.22 on 750 units/hr 9/15 >> HL 0.30 on 800 units/hr, Hgb 7.1, PLTs 239  Goal of Therapy:  Heparin level 0.3-0.7 units/ml Monitor platelets by anticoagulation protocol: Yes   Plan:  --Increase heparin to 850 units/hr --Recheck level at 1630 --Daily CBC and HL  Viann Fish 01/01/2015,10:06 AM

## 2015-01-01 NOTE — Telephone Encounter (Signed)
New Message  Pt sister calling to let Dr Calvert Cantor that pt is in ICU at [facility]. Pt sister just wanted to relay this information.

## 2015-01-01 NOTE — Progress Notes (Signed)
Nutrition Follow-up  INTERVENTION:   Continue Vital HP @ 50 ml/hr.   Tube feeding regimen + current Propofol infusion provides 1604 kcal (107% of needs), 105 grams of protein, and 1003 ml of H2O.   RD to continue to monitor  NUTRITION DIAGNOSIS:   Inadequate oral intake related to inability to eat as evidenced by NPO status.  GOAL:   Patient will meet greater than or equal to 90% of their needs  MONITOR:   Vent status, TF tolerance, Weight trends, Labs, I & O's  ASSESSMENT:   64 y.o. female with a past medical history of COPD, cigarette smoker, history of multiple pneumonias, history of C. difficile, history of partial colectomy and ileostomy, paroxysmal atrial fibrillation hypertension, chronic myelocytic monoclonal leukemia, chronic anemia that requires Epogen injections and occasional blood transfusions, MRSA carrier, GERD who comes to the emergency department due to worsening shortness of breath, pleuritic chest pain, fatigue, cold sweats for several days. She has also been having occasionally productive cough of mucus, decreased appetite, difficulty sleeping and dehydration due to high ileostomy output with decreased oral intake.    Patient is currently intubated on ventilator support MV: 13.5 L/min Temp (24hrs), Avg:98.4 F (36.9 C), Min:98 F (36.7 C), Max:98.6 F (37 C)  Propofol: 15.3 ml/hr -> provides 404 fat kcal  Labs reviewed: CBGs: 128-131 Elevated BUN & Creatinine  Diet Order:  Diet NPO time specified  Skin:  Wound (see comment) (abdominal incision)  Last BM:  9/15  Height:   Ht Readings from Last 1 Encounters:  12/30/14 5\' 5"  (1.651 m)    Weight:   Wt Readings from Last 1 Encounters:  01/01/15 171 lb 4.8 oz (77.7 kg)    Ideal Body Weight:  56.82 kg (kg)  BMI:  Body mass index is 28.51 kg/(m^2).  Estimated Nutritional Needs:   Kcal:  1491  Protein:  90-100g  Fluid:  1.5L/day  EDUCATION NEEDS:   No education needs identified at  this time  Clayton Bibles, MS, RD, LDN Pager: [phone] After Hours Pager: [phone]

## 2015-01-01 NOTE — Progress Notes (Signed)
eLink Physician-Brief Progress Note Patient Name: [name] DOB: 02-05-1951 MRN: [id number]   Date of Service  01/01/2015  HPI/Events of Note  Pt awake and making multiple requests 1) nicotine 2) nexium 3) celebrex 4) more pain meds   eICU Interventions  1) nicotine low doses ok = 7 mg/day 2) nexium not avail but protonix ok 3) celebrex not ideal with renal fxn deteriorating 4) ok to use  Percocet up to 2 q4h prn/ supplement with fentanyl IV   Orders written      Intervention Category Minor Interventions: Agitation / anxiety - evaluation and management  Christinia Gully 01/01/2015, 5:11 PM

## 2015-01-01 NOTE — Progress Notes (Signed)
Date:  Sept. 15, 2016 U.R. performed for needs and level of care. Will continue to follow for Case Management needs.  [name], RN, BSN, [place]   [phone]

## 2015-01-01 NOTE — Progress Notes (Signed)
ANTICOAGULATION CONSULT NOTE - Follow Up Consult  Pharmacy Consult for Heparin Indication: atrial fibrillation  Allergies  Allergen Reactions  . Vancomycin Hives and Rash    wheezing  . Ativan [Lorazepam] Other (See Comments)    "makes me crazy"  . Codeine Nausea And Vomiting  . Digoxin And Related Swelling  . Tetanus Toxoids Other (See Comments)    Serum reaction  . Penicillins Hives and Rash  . Xarelto [Rivaroxaban] Hives and Rash    (May not be allergic. Vancomycin was also being taken when reaction occurred.)    Patient Measurements: Height: 5\' 5"  (165.1 cm) Weight: 171 lb 4.8 oz (77.7 kg) IBW/kg (Calculated) : 57  Heparin dosing weight: 72 kg  Vital Signs: Temp: 97.5 F (36.4 C) (09/15 1600) Temp Source: Axillary (09/15 1600) BP: 109/61 mmHg (09/15 1600) Pulse Rate: 118 (09/15 1000)  Labs:  Recent Labs  12/31/14 0535 12/31/14 1430 12/31/14 1830 12/31/14 2200 01/01/15 0500 01/01/15 0940 01/01/15 1705  HGB 7.3* 7.4* 7.3*  --  7.1*  --   --   HCT 22.9* 23.1* 22.7*  --  21.9*  --   --   PLT 190  --  207  --  239  --   --   HEPARINUNFRC  --  0.73*  --  0.22*  --  0.30 0.22*  CREATININE 1.44* 1.38* 1.37*  --  1.51*  --   --     Estimated Creatinine Clearance: 38.3 mL/min (by C-G formula based on Cr of 1.51).   Medications:  Infusions:  . sodium chloride 100 mL/hr at 01/01/15 1728  . feeding supplement (VITAL HIGH PROTEIN) 1,000 mL (01/01/15 0935)  . heparin 850 Units/hr (01/01/15 1015)  . norepinephrine (LEVOPHED) Adult infusion 6 mcg/min (01/01/15 1234)  . phenylephrine (NEO-SYNEPHRINE) Adult infusion Stopped (12/28/14 0846)  . propofol (DIPRIVAN) infusion 15 mcg/kg/min (01/01/15 1725)    Assessment: 64 year old female presents with increased shortness of breath over the last 4 days associated which chest pain. She's been treated multiple times for pneumonia with her primary care physician recently. She has been admitted to the hospital previously with  ARDS and multifocal pneumonia. Patient known to pharmacy for antibiotic dosing. The night on early 9/11 am patient developed afib w/ RVR. Pharmacy asked to dose heparin gtt.   AFib CHADS2VASc = 3 Hx of Anemia and CMML  Today, 01/01/2015  HL 0.22 on 850 units/hr  No problems per RN  Goal of Therapy:  Heparin level 0.3-0.7 units/ml Monitor platelets by anticoagulation protocol: Yes   Plan:  Increase IV heparin from 850 units/hr to 1000 units/hr Recheck heparin level 6 hrs after rate change   Adrian Saran, PharmD, BCPS Pager [phone] 01/01/2015 5:40 PM

## 2015-01-02 ENCOUNTER — Inpatient Hospital Stay (HOSPITAL_COMMUNITY): Payer: 59

## 2015-01-02 DIAGNOSIS — D649 Anemia, unspecified: Secondary | ICD-10-CM | POA: Insufficient documentation

## 2015-01-02 LAB — CBC WITH DIFFERENTIAL/PLATELET
BASOS ABS: 0 10*3/uL (ref 0.0–0.1)
Basophils Relative: 0 %
EOS ABS: 0.2 10*3/uL (ref 0.0–0.7)
Eosinophils Relative: 1 %
HCT: 17.1 % — ABNORMAL LOW (ref 36.0–46.0)
HEMOGLOBIN: 5.4 g/dL — AB (ref 12.0–15.0)
LYMPHS PCT: 16 %
Lymphs Abs: 2.6 10*3/uL (ref 0.7–4.0)
MCH: 32.5 pg (ref 26.0–34.0)
MCHC: 31.6 g/dL (ref 30.0–36.0)
MCV: 103 fL — ABNORMAL HIGH (ref 78.0–100.0)
MONOS PCT: 30 %
Monocytes Absolute: 4.9 10*3/uL — ABNORMAL HIGH (ref 0.1–1.0)
NEUTROS ABS: 8.7 10*3/uL — AB (ref 1.7–7.7)
NEUTROS PCT: 53 %
PLATELETS: 248 10*3/uL (ref 150–400)
RBC: 1.66 MIL/uL — AB (ref 3.87–5.11)
RDW: 19.7 % — ABNORMAL HIGH (ref 11.5–15.5)
WBC: 16.4 10*3/uL — ABNORMAL HIGH (ref 4.0–10.5)

## 2015-01-02 LAB — BODY FLUID CULTURE
CULTURE: NO GROWTH
Special Requests: NORMAL

## 2015-01-02 LAB — GLUCOSE, CAPILLARY
GLUCOSE-CAPILLARY: 100 mg/dL — AB (ref 65–99)
GLUCOSE-CAPILLARY: 108 mg/dL — AB (ref 65–99)
GLUCOSE-CAPILLARY: 38 mg/dL — AB (ref 65–99)
GLUCOSE-CAPILLARY: 50 mg/dL — AB (ref 65–99)
GLUCOSE-CAPILLARY: 83 mg/dL (ref 65–99)
GLUCOSE-CAPILLARY: 85 mg/dL (ref 65–99)
GLUCOSE-CAPILLARY: 97 mg/dL (ref 65–99)
Glucose-Capillary: 114 mg/dL — ABNORMAL HIGH (ref 65–99)
Glucose-Capillary: 119 mg/dL — ABNORMAL HIGH (ref 65–99)
Glucose-Capillary: 20 mg/dL — CL (ref 65–99)

## 2015-01-02 LAB — RENAL FUNCTION PANEL
ALBUMIN: 1.6 g/dL — AB (ref 3.5–5.0)
ANION GAP: 8 (ref 5–15)
BUN: 49 mg/dL — ABNORMAL HIGH (ref 6–20)
CALCIUM: 7.6 mg/dL — AB (ref 8.9–10.3)
CO2: 23 mmol/L (ref 22–32)
Chloride: 106 mmol/L (ref 101–111)
Creatinine, Ser: 1.51 mg/dL — ABNORMAL HIGH (ref 0.44–1.00)
GFR calc non Af Amer: 35 mL/min — ABNORMAL LOW (ref >60)
GFR, EST AFRICAN AMERICAN: 41 mL/min — AB (ref >60)
GLUCOSE: 131 mg/dL — AB (ref 65–99)
PHOSPHORUS: 4.8 mg/dL — AB (ref 2.5–4.6)
Potassium: 3.5 mmol/L (ref 3.5–5.1)
SODIUM: 137 mmol/L (ref 135–145)

## 2015-01-02 LAB — HEPARIN LEVEL (UNFRACTIONATED): HEPARIN UNFRACTIONATED: 0.26 [IU]/mL — AB (ref 0.30–0.70)

## 2015-01-02 LAB — TRIGLYCERIDES: Triglycerides: 182 mg/dL — ABNORMAL HIGH (ref <150)

## 2015-01-02 LAB — PREPARE RBC (CROSSMATCH)

## 2015-01-02 LAB — MAGNESIUM: Magnesium: 1.6 mg/dL — ABNORMAL LOW (ref 1.7–2.4)

## 2015-01-02 MED ORDER — ALBUMIN HUMAN 25 % IV SOLN: 12.5000 g | Freq: Four times a day (QID) | INTRAVENOUS | Status: DC

## 2015-01-02 MED ORDER — POTASSIUM CHLORIDE 20 MEQ/15ML (10%) PO SOLN
20.0000 meq | Freq: Four times a day (QID) | ORAL | Status: AC
  Administered 2015-01-02 (×3): 20 meq
  Filled 2015-01-02 (×3): qty 15

## 2015-01-02 MED ORDER — IOHEXOL 300 MG/ML  SOLN
25.0000 mL | INTRAMUSCULAR | Status: AC
  Administered 2015-01-02 (×2): 25 mL via ORAL

## 2015-01-02 MED ORDER — FUROSEMIDE 10 MG/ML IJ SOLN
40.0000 mg | Freq: Four times a day (QID) | INTRAMUSCULAR | Status: AC
  Administered 2015-01-02 – 2015-01-03 (×3): 40 mg via INTRAVENOUS
  Filled 2015-01-02 (×3): qty 4

## 2015-01-02 MED ORDER — SODIUM CHLORIDE 0.9 % IV SOLN
Freq: Once | INTRAVENOUS | Status: AC
  Administered 2015-01-02: 18:00:00 via INTRAVENOUS

## 2015-01-02 MED ORDER — DEXTROSE 50 % IV SOLN
INTRAVENOUS | Status: AC
  Filled 2015-01-02: qty 50

## 2015-01-02 MED ORDER — MAGNESIUM SULFATE 4 GM/100ML IV SOLN
4.0000 g | Freq: Once | INTRAVENOUS | Status: AC
  Administered 2015-01-02: 4 g via INTRAVENOUS
  Filled 2015-01-02: qty 100

## 2015-01-02 MED ORDER — HEPARIN (PORCINE) IN NACL 100-0.45 UNIT/ML-% IJ SOLN
1100.0000 [IU]/h | INTRAMUSCULAR | Status: DC
  Filled 2015-01-02: qty 250

## 2015-01-02 MED ORDER — SODIUM CHLORIDE 0.9 % IV SOLN: Freq: Once | INTRAVENOUS | Status: DC

## 2015-01-02 MED ORDER — CHLORHEXIDINE GLUCONATE 0.12 % MT SOLN
OROMUCOSAL | Status: AC
  Filled 2015-01-02: qty 15

## 2015-01-02 MED ORDER — ALBUMIN HUMAN 25 % IV SOLN
12.5000 g | Freq: Four times a day (QID) | INTRAVENOUS | Status: AC
  Administered 2015-01-02 (×3): 12.5 g via INTRAVENOUS
  Filled 2015-01-02 (×3): qty 50

## 2015-01-02 MED ORDER — FUROSEMIDE 10 MG/ML IJ SOLN: 40.0000 mg | Freq: Four times a day (QID) | INTRAMUSCULAR | Status: DC

## 2015-01-02 NOTE — Progress Notes (Signed)
ANTICOAGULATION CONSULT NOTE - Follow Up Consult  Pharmacy Consult for Heparin Indication: atrial fibrillation  Allergies  Allergen Reactions  . Vancomycin Hives and Rash    wheezing  . Ativan [Lorazepam] Other (See Comments)    "makes me crazy"  . Codeine Nausea And Vomiting  . Digoxin And Related Swelling  . Tetanus Toxoids Other (See Comments)    Serum reaction  . Penicillins Hives and Rash  . Xarelto [Rivaroxaban] Hives and Rash    (May not be allergic. Vancomycin was also being taken when reaction occurred.)    Patient Measurements: Height: 5\' 5"  (165.1 cm) Weight: 171 lb 4.8 oz (77.7 kg) IBW/kg (Calculated) : 57 Heparin Dosing Weight:   Vital Signs: Temp: 97.6 F (36.4 C) (09/15 1944) Temp Source: Axillary (09/15 1944) BP: 105/53 mmHg (09/16 0100) Pulse Rate: 119 (09/15 2000)  Labs:  Recent Labs  12/31/14 0535 12/31/14 1430 12/31/14 1830  01/01/15 0500 01/01/15 0940 01/01/15 1705 01/02/15 0040  HGB 7.3* 7.4* 7.3*  --  7.1*  --   --   --   HCT 22.9* 23.1* 22.7*  --  21.9*  --   --   --   PLT 190  --  207  --  239  --   --   --   HEPARINUNFRC  --  0.73*  --   < >  --  0.30 0.22* 0.26*  CREATININE 1.44* 1.38* 1.37*  --  1.51*  --   --   --   < > = values in this interval not displayed.  Estimated Creatinine Clearance: 38.3 mL/min (by C-G formula based on Cr of 1.51).   Medications:  Infusions:  . sodium chloride 100 mL/hr at 01/02/15 0100  . feeding supplement (VITAL HIGH PROTEIN) 1,000 mL (01/02/15 0100)  . heparin 1,000 Units/hr (01/02/15 0100)  . heparin    . norepinephrine (LEVOPHED) Adult infusion 6 mcg/min (01/02/15 0100)  . phenylephrine (NEO-SYNEPHRINE) Adult infusion Stopped (12/28/14 0846)  . propofol (DIPRIVAN) infusion 35 mcg/kg/min (01/02/15 0116)    Assessment: Patient with low heparin level again.  No heparin issues per RN.  Goal of Therapy:  Heparin level 0.3-0.7 units/ml Monitor platelets by anticoagulation protocol: Yes    Plan:  Increase heparin drip to 1100 units/hr Recheck level at [address] [place] 01/02/2015,3:05 AM

## 2015-01-02 NOTE — Consult Note (Signed)
WOC ostomy consult note Stoma type/location: RLQ Ileostomy.  Patient is independent in self care but too weak today.  A pouch change is performed. Stomal assessment/size: 2 " slightly oval.   Peristomal assessment: Scarring from previous abdominal surgeries.  Scattered scabbed lesions to abdomen, do not involve pouching plane.  Treatment options for stomal/peristomal skin: 1 piece convex pouch used.  Output Liquid light brown stool Ostomy pouching: 2 pc.  Education provided: None needed at this time.  Enrolled patient in [place] program: No Will not follow at this time.  Please re-consult if needed.  Domenic Moras RN BSN [place] Pager [phone]

## 2015-01-02 NOTE — Progress Notes (Signed)
PULMONARY / CRITICAL CARE MEDICINE   Name: [name] MRN: [id number] DOB: 02/06/1951    ADMISSION DATE:  12/23/2014 CONSULTATION DATE:  12/23/2014  REFERRING MD :  Dr. Karleen Hampshire   CHIEF COMPLAINT:  Shortness of breath   INITIAL PRESENTATION:  64 yo female smoker with dyspnea, VDRF from pneumococcal PNA and septic shock.  She has hx of HTN, GERD, PAF, CMML, COPD.  STUDIES:  9/05  CT Chest w/o >> diffuse bilateral airspace disease, consolidation involving all lobes of L/R lung, several discrete nodules present in upper lobes, bulky mediastinal adenopathy  SIGNIFICANT EVENTS: 9/05  Admit with worsening SOB  9/06 intubated for hypoxia while receiving fluid bolus ? Edema, levo gtt 9/10 Extubated, transfuse 1 unit PRBC 9/11 A fib with RVR >> started amiodarone/levophed, respiratory failure >> re-intubated 9/12 mottled (hx of Raynauds) ECHO pending. Appropriate. Scr bumped.  9/13 Diagnostic/Therapeutic right Thoracentesis. 650 ML clear appearing. Transudate by [facility]   9/14 lasix for neg volume, weaning.  9/15 lasix and albumin. Passed SBT but had sig WOB and tachycardia.  9/16 failed SBT. Still positive volume in spite of lasix. Still on low dose pressors but may be r/t diprivan. Albumin/lasix x 3 doses. Family updated. Will likely need trach. Anemic. Getting 1 unit blood.   SUBJECTIVE:  No distress.not ready for extubation. Reported she was tired and got tachycardic during weaning attempt   VITAL SIGNS: Temp:  [97.5 F (36.4 C)-99 F (37.2 C)] 99 F (37.2 C) (09/16 0334) Pulse Rate:  [109-119] 119 (09/15 2000) Resp:  [22-28] 28 (09/16 0700) BP: (83-135)/(43-90) 89/49 mmHg (09/16 0700) SpO2:  [92 %-100 %] 94 % (09/16 0745) FiO2 (%):  [30 %] 30 % (09/16 0748) Weight:  [79.5 kg (175 lb 4.3 oz)] 79.5 kg (175 lb 4.3 oz) (09/16 0447)  CVP:  [8 mmHg-15 mmHg] 12 mmHg   VENTILATOR SETTINGS: Vent Mode:  [-] CPAP FiO2 (%):  [30 %] 30 % Set Rate:  [28 bmp] 28 bmp Vt Set:  [500 mL]  500 mL PEEP:  [0 cmH20-5 cmH20] 0 cmH20 Pressure Support:  [0 cmH20] 0 cmH20 Plateau Pressure:  [15 cmH20-22 cmH20] 22 cmH20   INTAKE / OUTPUT:  Intake/Output Summary (Last 24 hours) at 01/02/15 0823 Last data filed at 01/02/15 0600  Gross per 24 hour  Intake 4220.1 ml  Output   1495 ml  Net 2725.1 ml    PHYSICAL EXAMINATION: General: awake, orally intubated.  Neuro: Alert, following commands HEENT:  NCAT. ETT in place Cardiovascular:  RRR, systolic murmur Lungs: clear, diminished in bases  Abdomen:  Ileostomy, mid-line old abd wound dressing. Abd soft, non-tender  Musculoskeletal: 2+ pitting edema bilat LE Skin: no rash   LABS:  CBC  Recent Labs Lab 12/31/14 1830 01/01/15 0500 01/02/15 0432  WBC 14.0* 17.3* 16.4*  HGB 7.3* 7.1* 5.4*  HCT 22.7* 21.9* 17.1*  PLT 207 239 248   BMET  Recent Labs Lab 12/31/14 1830 01/01/15 0500 01/02/15 0432  NA 135 136 137  K 3.9 3.8 3.5  CL 103 103 106  CO2 23 24 23   BUN 42* 47* 49*  CREATININE 1.37* 1.51* 1.51*  GLUCOSE 125* 127* 131*   Electrolytes  Recent Labs Lab 12/31/14 0535 12/31/14 1430 12/31/14 1830 01/01/15 0500 01/02/15 0432  CALCIUM 7.1* 7.0* 7.0* 7.5* 7.6*  MG 1.6* 2.2  --  1.9 1.6*  PHOS 3.8  --   --  4.4 4.8*     Sepsis Markers  Recent Labs Lab 12/31/14 1830  LATICACIDVEN 0.8   ABG  Recent Labs Lab 12/26/14 1100 12/28/14 0710  PHART 7.415 7.442  PCO2ART 54.1* 42.9  PO2ART 49.8* 76.0*   Liver Enzymes  Recent Labs Lab 12/31/14 1830 01/01/15 0500 01/02/15 0432  AST 14*  --   --   ALT 9*  --   --   ALKPHOS 117  --   --   BILITOT 0.3  --   --   ALBUMIN 1.6* 1.5* 1.6*   Cardiac Enzymes  Recent Labs Lab 12/28/14 0415 12/28/14 1005 12/28/14 1700  TROPONINI <0.03 0.03 0.03     Glucose  Recent Labs Lab 01/01/15 0902 01/01/15 1147 01/01/15 1631 01/01/15 1942 01/01/15 2342 01/02/15 0326  GLUCAP 131* 128* 104* 118* 97 100*    Imaging Dg Chest Port 1  View  01/01/2015   CLINICAL DATA:  Follow-up pneumonia  EXAM: PORTABLE CHEST - 1 VIEW  COMPARISON:  12/31/2014  FINDINGS: Cardiomegaly. Moderate patchy perihilar airspace opacities. Moderate right and small left pleural effusions. Overall appearance favors moderate interstitial edema over infection.  No pneumothorax.  Endotracheal tube terminates 2 cm above the carina.  Right arm PICC terminates in the lower SVC. Enteric tube courses into the stomach.  IMPRESSION: Cardiomegaly with suspected moderate interstitial edema and bilateral pleural effusions, right greater than left. Infection is considered less likely. Overall appearance is grossly unchanged.  Endotracheal tube terminates 2 cm above the carina. Additional support apparatus as above.   Electronically Signed   By: Julian Hy M.D.   On: 01/01/2015 09:30  PCXR:   ASSESSMENT / PLAN:  PULMONARY ETT 9/06 >> 9/10 ETT 9/11 >>  A: Acute hypoxic respiratory failure 2nd to Streptococcal PNA and pulmonary edema  B/l nodular infiltrates with mediastinal LAN s/p bronch 9/07. Tobacco abuse with presumed COPD. Bilateral pulmonary effusions likely r/t CHF  Her WOB in setting of poor protein stores and severe deconditioning are the biggest issues.   P:   Continue PS trials as tolerates  Arrange for perc trach Scheduled duoneb and budesonide Oxygen to keep SpO2 > 92%  CARDIOVASCULAR Rt PICC 9/06 >> A:  A-Fib with RVR- converted to NSR. Currently ST.  Septic shock 2nd to Streptococcal PNA. Hx of hypertension. P:  Wean off pressors to keep MAP > 65 Continue ASA Heparin gtt per pharmacy  Consider Coumadin vs. NOAC in future   RENAL A:   AKI 2nd to sepsis, hypovolemia >> had resolved. Now a little worse. May be r/t hypotension   Metabolic acidosis >> resolved. Hypokalemia >> resolved Intermittent Hypomagnesemia Hypervolemia P:   Replace Mg and lytes as needed Ideally want negative fluid balance. Stop NS infusion  F/u chem in  AM  GASTROINTESTINAL A:   Chronic diarrhea with C diff colonization. Hx of GERD. Nutrition. P:   Tube feeds while on vent Zantac for SUP  HEMATOLOGIC A:   Hx of CMML with chronic anemia. hgb dropped to 5.4, from ; no evidence of active bleeding-->transfused  P:  Trend CBC  INFECTIOUS A:   Septic shock 2nd to Streptococcal pneumonia, Strep Bacteremia? As only 1/2 cultures  >completed full treatment w/ Tressie Ellis 9/14 Cdiff colonization P:   Trend fever and WBC curve  ENDOCRINE A: Hyperglycemia. P: Monitor blood sugars  NEUROLOGY A: Chronic pain. depression P: RASS goal -1  Attempted albumin and lasix. Hemodynamics stable but volume status unchanged. Failed SBT. Will cont to push diuretics but still think that w/ her malnutrition and deconditioning are her biggest barriers and that she  will need trach and prolonged care.    Erick Colace ACNP-BC [place] Pager # [phone] OR # [phone] if no answer

## 2015-01-02 NOTE — Progress Notes (Signed)
eLink Physician-Brief Progress Note Patient Name: [name] DOB: Jan 11, 1951 MRN: [id number]   Date of Service  01/02/2015  HPI/Events of Note  F/U of CT of ABD shows multiple abnormalities including spleenomeagly but with multiple fluid collections, infectious vs seroma vs blood.  Receiving transfusion and heparin d/ced.  Remains on 9 mcg NE for BP support.  Lactate had been WNL on 9/14. WBC is elevated on no ABX but h/o of C Diff and completed rx of Strep PNA on 9/14.  Afebrile.  eICU Interventions  Plan: Hold on ABX for now Re-check lactate Consider surgical consult     Intervention Category Intermediate Interventions: Diagnostic test evaluation  Ociel Retherford 01/02/2015, 10:06 PM

## 2015-01-02 NOTE — Progress Notes (Signed)
eLink Physician-Brief Progress Note Patient Name: [name] DOB: [birth year] MRN: [id number]   Date of Service  01/02/2015  HPI/Events of Note  Hg 5.5  eICU Interventions  Transfuse one unit        YACOUB,WESAM 01/02/2015, 5:54 AM

## 2015-01-03 ENCOUNTER — Inpatient Hospital Stay (HOSPITAL_COMMUNITY): Payer: 59

## 2015-01-03 DIAGNOSIS — J90 Pleural effusion, not elsewhere classified: Secondary | ICD-10-CM

## 2015-01-03 LAB — RENAL FUNCTION PANEL
ANION GAP: 11 (ref 5–15)
Albumin: 2.2 g/dL — ABNORMAL LOW (ref 3.5–5.0)
BUN: 53 mg/dL — ABNORMAL HIGH (ref 6–20)
CALCIUM: 8.4 mg/dL — AB (ref 8.9–10.3)
CO2: 22 mmol/L (ref 22–32)
CREATININE: 1.58 mg/dL — AB (ref 0.44–1.00)
Chloride: 102 mmol/L (ref 101–111)
GFR, EST AFRICAN AMERICAN: 39 mL/min — AB (ref >60)
GFR, EST NON AFRICAN AMERICAN: 33 mL/min — AB (ref >60)
Glucose, Bld: 124 mg/dL — ABNORMAL HIGH (ref 65–99)
PHOSPHORUS: 5.7 mg/dL — AB (ref 2.5–4.6)
Potassium: 3.8 mmol/L (ref 3.5–5.1)
SODIUM: 135 mmol/L (ref 135–145)

## 2015-01-03 LAB — CBC WITH DIFFERENTIAL/PLATELET
BAND NEUTROPHILS: 28 %
BASOS PCT: 0 %
Band Neutrophils: 17 %
Basophils Absolute: 0 10*3/uL (ref 0.0–0.1)
Basophils Absolute: 0 10*3/uL (ref 0.0–0.1)
Basophils Relative: 0 %
Eosinophils Absolute: 0 10*3/uL (ref 0.0–0.7)
Eosinophils Absolute: 0 10*3/uL (ref 0.0–0.7)
Eosinophils Relative: 0 %
Eosinophils Relative: 0 %
HCT: 20.4 % — ABNORMAL LOW (ref 36.0–46.0)
HEMATOCRIT: 24.7 % — AB (ref 36.0–46.0)
HEMOGLOBIN: 7.7 g/dL — AB (ref 12.0–15.0)
Hemoglobin: 6.8 g/dL — CL (ref 12.0–15.0)
LYMPHS ABS: 2.5 10*3/uL (ref 0.7–4.0)
Lymphocytes Relative: 10 %
Lymphocytes Relative: 18 %
Lymphs Abs: 8.2 10*3/uL — ABNORMAL HIGH (ref 0.7–4.0)
MCH: 30.6 pg (ref 26.0–34.0)
MCH: 30.7 pg (ref 26.0–34.0)
MCHC: 33.3 g/dL (ref 30.0–36.0)
MCHC: 31.2 g/dL (ref 30.0–36.0)
MCV: 91.9 fL (ref 78.0–100.0)
MCV: 98.4 fL (ref 78.0–100.0)
METAMYELOCYTES PCT: 1 %
MONO ABS: 4.5 10*3/uL — AB (ref 0.1–1.0)
MYELOCYTES: 4 %
MYELOCYTES: 1 %
Monocytes Absolute: 5.9 10*3/uL — ABNORMAL HIGH (ref 0.1–1.0)
Monocytes Relative: 18 %
Monocytes Relative: 13 %
NEUTROS ABS: 17.9 10*3/uL — AB (ref 1.7–7.7)
NEUTROS ABS: 31.4 10*3/uL — AB (ref 1.7–7.7)
NEUTROS PCT: 51 %
NEUTROS PCT: 39 %
PLATELETS: 260 10*3/uL (ref 150–400)
Platelets: 157 10*3/uL (ref 150–400)
RBC: 2.22 MIL/uL — AB (ref 3.87–5.11)
RBC: 2.51 MIL/uL — ABNORMAL LOW (ref 3.87–5.11)
RDW: 22.9 % — AB (ref 11.5–15.5)
RDW: 24.2 % — ABNORMAL HIGH (ref 11.5–15.5)
WBC: 24.9 10*3/uL — AB (ref 4.0–10.5)
WBC: 45.5 10*3/uL — ABNORMAL HIGH (ref 4.0–10.5)

## 2015-01-03 LAB — COMPREHENSIVE METABOLIC PANEL
ALK PHOS: 163 U/L — AB (ref 38–126)
ALT: 13 U/L — AB (ref 14–54)
AST: 30 U/L (ref 15–41)
Albumin: 2.2 g/dL — ABNORMAL LOW (ref 3.5–5.0)
Anion gap: 15 (ref 5–15)
BUN: 51 mg/dL — AB (ref 6–20)
CALCIUM: 8.7 mg/dL — AB (ref 8.9–10.3)
CO2: 13 mmol/L — AB (ref 22–32)
CREATININE: 1.74 mg/dL — AB (ref 0.44–1.00)
Chloride: 103 mmol/L (ref 101–111)
GFR calc non Af Amer: 30 mL/min — ABNORMAL LOW (ref >60)
GFR, EST AFRICAN AMERICAN: 34 mL/min — AB (ref >60)
Glucose, Bld: 193 mg/dL — ABNORMAL HIGH (ref 65–99)
Potassium: 5.9 mmol/L — ABNORMAL HIGH (ref 3.5–5.1)
SODIUM: 131 mmol/L — AB (ref 135–145)
Total Bilirubin: 0.8 mg/dL (ref 0.3–1.2)
Total Protein: 4.7 g/dL — ABNORMAL LOW (ref 6.5–8.1)

## 2015-01-03 LAB — BASIC METABOLIC PANEL
ANION GAP: 8 (ref 5–15)
Anion gap: 11 (ref 5–15)
BUN: 54 mg/dL — AB (ref 6–20)
BUN: 57 mg/dL — AB (ref 6–20)
CHLORIDE: 103 mmol/L (ref 101–111)
CO2: 22 mmol/L (ref 22–32)
CO2: 22 mmol/L (ref 22–32)
CREATININE: 1.56 mg/dL — AB (ref 0.44–1.00)
Calcium: 8.3 mg/dL — ABNORMAL LOW (ref 8.9–10.3)
Calcium: 7.8 mg/dL — ABNORMAL LOW (ref 8.9–10.3)
Chloride: 102 mmol/L (ref 101–111)
Creatinine, Ser: 1.73 mg/dL — ABNORMAL HIGH (ref 0.44–1.00)
GFR calc Af Amer: 39 mL/min — ABNORMAL LOW (ref >60)
GFR calc Af Amer: 35 mL/min — ABNORMAL LOW (ref >60)
GFR, EST NON AFRICAN AMERICAN: 34 mL/min — AB (ref >60)
GFR, EST NON AFRICAN AMERICAN: 30 mL/min — AB (ref >60)
GLUCOSE: 125 mg/dL — AB (ref 65–99)
GLUCOSE: 129 mg/dL — AB (ref 65–99)
POTASSIUM: 3.7 mmol/L (ref 3.5–5.1)
POTASSIUM: 4.2 mmol/L (ref 3.5–5.1)
Sodium: 135 mmol/L (ref 135–145)
Sodium: 133 mmol/L — ABNORMAL LOW (ref 135–145)

## 2015-01-03 LAB — CBC
HCT: 20.7 % — ABNORMAL LOW (ref 36.0–46.0)
Hemoglobin: 7 g/dL — ABNORMAL LOW (ref 12.0–15.0)
MCH: 31.1 pg (ref 26.0–34.0)
MCHC: 33.8 g/dL (ref 30.0–36.0)
MCV: 92 fL (ref 78.0–100.0)
PLATELETS: 243 10*3/uL (ref 150–400)
RBC: 2.25 MIL/uL — AB (ref 3.87–5.11)
RDW: 22.5 % — ABNORMAL HIGH (ref 11.5–15.5)
WBC: 21.4 10*3/uL — ABNORMAL HIGH (ref 4.0–10.5)

## 2015-01-03 LAB — TROPONIN I
TROPONIN I: 0.41 ng/mL — AB (ref <0.031)
TROPONIN I: 0.33 ng/mL — AB (ref <0.031)
Troponin I: 0.04 ng/mL — ABNORMAL HIGH (ref <0.031)

## 2015-01-03 LAB — GLUCOSE, CAPILLARY
GLUCOSE-CAPILLARY: 54 mg/dL — AB (ref 65–99)
GLUCOSE-CAPILLARY: 122 mg/dL — AB (ref 65–99)
GLUCOSE-CAPILLARY: 138 mg/dL — AB (ref 65–99)
Glucose-Capillary: 112 mg/dL — ABNORMAL HIGH (ref 65–99)
Glucose-Capillary: 128 mg/dL — ABNORMAL HIGH (ref 65–99)
Glucose-Capillary: 142 mg/dL — ABNORMAL HIGH (ref 65–99)

## 2015-01-03 LAB — PHOSPHORUS
PHOSPHORUS: 7.2 mg/dL — AB (ref 2.5–4.6)
Phosphorus: 5.5 mg/dL — ABNORMAL HIGH (ref 2.5–4.6)

## 2015-01-03 LAB — HEMOGLOBIN AND HEMATOCRIT, BLOOD
HCT: 18.2 % — ABNORMAL LOW (ref 36.0–46.0)
HCT: 24.5 % — ABNORMAL LOW (ref 36.0–46.0)
Hemoglobin: 6.1 g/dL — CL (ref 12.0–15.0)
Hemoglobin: 8.2 g/dL — ABNORMAL LOW (ref 12.0–15.0)

## 2015-01-03 LAB — MAGNESIUM
MAGNESIUM: 2.1 mg/dL (ref 1.7–2.4)
MAGNESIUM: 2.7 mg/dL — AB (ref 1.7–2.4)

## 2015-01-03 LAB — AMYLASE: Amylase: 86 U/L (ref 28–100)

## 2015-01-03 LAB — PROTIME-INR
INR: 1.09 (ref 0.00–1.49)
Prothrombin Time: 14.3 seconds (ref 11.6–15.2)

## 2015-01-03 LAB — LACTIC ACID, PLASMA
LACTIC ACID, VENOUS: 1.6 mmol/L (ref 0.5–2.0)
Lactic Acid, Venous: 0.8 mmol/L (ref 0.5–2.0)
Lactic Acid, Venous: 5.7 mmol/L (ref 0.5–2.0)

## 2015-01-03 LAB — PROCALCITONIN: PROCALCITONIN: 26.94 ng/mL

## 2015-01-03 LAB — LIPASE, BLOOD: LIPASE: 27 U/L (ref 22–51)

## 2015-01-03 LAB — TRIGLYCERIDES: TRIGLYCERIDES: 199 mg/dL — AB (ref <150)

## 2015-01-03 LAB — APTT: aPTT: 30 seconds (ref 24–37)

## 2015-01-03 MED ORDER — DEXTROSE 5 % IV SOLN
2.0000 g | Freq: Once | INTRAVENOUS | Status: AC
  Administered 2015-01-03: 2 g via INTRAVENOUS
  Filled 2015-01-03: qty 2

## 2015-01-03 MED ORDER — LINEZOLID 600 MG/300ML IV SOLN
600.0000 mg | Freq: Two times a day (BID) | INTRAVENOUS | Status: DC
  Administered 2015-01-04 – 2015-01-05 (×4): 600 mg via INTRAVENOUS
  Filled 2015-01-03 (×4): qty 300

## 2015-01-03 MED ORDER — ALTEPLASE 2 MG IJ SOLR
2.0000 mg | Freq: Once | INTRAMUSCULAR | Status: DC
  Filled 2015-01-03: qty 2

## 2015-01-03 MED ORDER — DEXTROSE 5 % IV SOLN
1.0000 g | Freq: Three times a day (TID) | INTRAVENOUS | Status: DC
  Administered 2015-01-04 – 2015-01-06 (×7): 1 g via INTRAVENOUS
  Filled 2015-01-03 (×9): qty 1

## 2015-01-03 MED ORDER — CHLORHEXIDINE GLUCONATE 0.12 % MT SOLN
OROMUCOSAL | Status: AC
  Filled 2015-01-03: qty 15

## 2015-01-03 MED ORDER — SODIUM CHLORIDE 0.9 % IV SOLN
Freq: Once | INTRAVENOUS | Status: AC
  Administered 2015-01-03: 17:00:00 via INTRAVENOUS

## 2015-01-03 MED ORDER — PANTOPRAZOLE SODIUM 40 MG IV SOLR
40.0000 mg | INTRAVENOUS | Status: DC
  Administered 2015-01-03 – 2015-01-11 (×9): 40 mg via INTRAVENOUS
  Filled 2015-01-03 (×10): qty 40

## 2015-01-03 MED ORDER — SODIUM CHLORIDE 0.9 % IV BOLUS (SEPSIS)
250.0000 mL | Freq: Once | INTRAVENOUS | Status: AC
  Administered 2015-01-03: 250 mL via INTRAVENOUS

## 2015-01-03 MED ORDER — SODIUM BICARBONATE 8.4 % IV SOLN
50.0000 meq | Freq: Once | INTRAVENOUS | Status: AC
  Administered 2015-01-03: 50 meq via INTRAVENOUS

## 2015-01-03 MED ORDER — METRONIDAZOLE IN NACL 5-0.79 MG/ML-% IV SOLN
500.0000 mg | Freq: Three times a day (TID) | INTRAVENOUS | Status: DC
  Administered 2015-01-03 – 2015-01-12 (×26): 500 mg via INTRAVENOUS
  Filled 2015-01-03 (×26): qty 100

## 2015-01-03 MED ORDER — NOREPINEPHRINE BITARTRATE 1 MG/ML IV SOLN
0.0000 ug/min | INTRAVENOUS | Status: DC
  Administered 2015-01-03 (×2): 40 ug/min via INTRAVENOUS
  Administered 2015-01-04: 37 ug/min via INTRAVENOUS
  Administered 2015-01-04: 34 ug/min via INTRAVENOUS
  Administered 2015-01-05: 26 ug/min via INTRAVENOUS
  Filled 2015-01-03 (×6): qty 16

## 2015-01-03 MED ORDER — SODIUM BICARBONATE 8.4 % IV SOLN
INTRAVENOUS | Status: AC
  Filled 2015-01-03: qty 50

## 2015-01-03 MED ORDER — ALTEPLASE 2 MG IJ SOLR
2.0000 mg | Freq: Once | INTRAMUSCULAR | Status: AC
  Administered 2015-01-03: 2 mg
  Filled 2015-01-03: qty 2

## 2015-01-03 NOTE — Progress Notes (Signed)
Late entry: 0930 - patient vomited, moderate amount of yellow liquid coming from mouth and noted in subglottic suction. Tube feeding turned off and MD notified. OG tube hooked up to low intermittent suction. 1L of stomach contents removed within several minutes. Shortly after, patient became agitated then immediately unresponsive and bradycardic. No pulse was felt and code blue called. CPR initiated immediately. Please see CPR record.

## 2015-01-03 NOTE — Progress Notes (Signed)
PULMONARY / CRITICAL CARE MEDICINE   Name: [name] MRN: [id number] DOB: 10-07-1950    ADMISSION DATE:  01/08/2015 CONSULTATION DATE:  12/23/2014  REFERRING MD :  Dr. Karleen Hampshire   CHIEF COMPLAINT:  Shortness of breath   INITIAL PRESENTATION:  64 yo female smoker with dyspnea, VDRF from pneumococcal PNA and septic shock.  She has hx of HTN, GERD, PAF, CMML, COPD.  STUDIES:  9/05  CT Chest w/o >> diffuse bilateral airspace disease, consolidation involving all lobes of L/R lung, several discrete nodules present in upper lobes, bulky mediastinal adenopathy  SIGNIFICANT EVENTS: 9/05  Admit with worsening SOB  9/06 intubated for hypoxia while receiving fluid bolus ? Edema, levo gtt 9/10 Extubated, transfuse 1 unit PRBC 9/11 A fib with RVR >> started amiodarone/levophed, respiratory failure >> re-intubated 9/12 mottled (hx of Raynauds) ECHO pending. Appropriate. Scr bumped.  9/13 Diagnostic/Therapeutic right Thoracentesis. 650 ML clear appearing. Transudate by [facility]   9/14 lasix for neg volume, weaning.  9/15 lasix and albumin. Passed SBT but had sig WOB and tachycardia.  9/16 failed SBT. Still positive volume in spite of lasix. Still on low dose pressors but may be r/t diprivan. Albumin/lasix x 3 doses. Family updated. Will likely need trach. Anemic. Getting 2 units blood.   SUBJECTIVE: No acute events overnight. Patient status post 2 units packed red blood cells. Patient did have some nausea and vomiting this morning.  ROS:  Unobtainable as the patient is currently intubated with some sedation with propofol.  VITAL SIGNS: Temp:  [97.6 F (36.4 C)-98.5 F (36.9 C)] 98.3 F (36.8 C) (09/17 0800) Pulse Rate:  [88-108] 108 (09/17 0410) Resp:  [20-31] 28 (09/17 0600) BP: (84-136)/(45-70) 108/63 mmHg (09/17 0600) SpO2:  [77 %-100 %] 96 % (09/17 0743) FiO2 (%):  [30 %-100 %] 30 % (09/17 0744) Weight:  [84 kg (185 lb 3 oz)] 84 kg (185 lb 3 oz) (09/17 0414)      VENTILATOR  SETTINGS: Vent Mode:  [-] PSV FiO2 (%):  [30 %-100 %] 30 % Set Rate:  [28 bmp] 28 bmp Vt Set:  [500 mL] 500 mL PEEP:  [5 cmH20] 5 cmH20 Pressure Support:  [12 cmH20] 12 cmH20 Plateau Pressure:  [21 JSE83-15 cmH20] 24 cmH20   INTAKE / OUTPUT:  Intake/Output Summary (Last 24 hours) at 01/03/15 0927 Last data filed at 01/03/15 0600  Gross per 24 hour  Intake 3673.77 ml  Output   2075 ml  Net 1598.77 ml    PHYSICAL EXAMINATION: General:  Awake. Sitting watching TV. No family in the room. Integument:  Warm & dry. No rash on exposed skin. HEENT:  No scleral injection or icterus. Endotracheal tube in place. PERRL. Cardiovascular:  Tachycardic.  Sinus tachycardia on telemetry. Anasarca. Pulmonary:  Gdecreased breath sounds bilateral lung bases. Symmetric chest wall rise on ventilator. Abdomen: Soft. Normal bowel sounds. Nondistended. Right-sided ostomy in place. Neurological:  Moving all 4 extremities equally. Patient attempting to write to communicate but illegible. Cranial nerves appear grossly intact.  LABS:  CBC  Recent Labs Lab 01/02/15 0432 01/03/15 0031 01/03/15 0550  WBC 16.4* 21.4* 24.9*  HGB 5.4* 7.0* 6.8*  HCT 17.1* 20.7* 20.4*  PLT 248 243 260   BMET  Recent Labs Lab 01/01/15 0500 01/02/15 0432 01/03/15 0550  NA 136 137 135  135  K 3.8 3.5 3.8  3.7  CL 103 106 102  102  CO2 24 23 22  22   BUN 47* 49* 53*  54*  CREATININE  1.51* 1.51* 1.58*  1.56*  GLUCOSE 127* 131* 124*  125*   Electrolytes  Recent Labs Lab 01/01/15 0500 01/02/15 0432 01/03/15 0550  CALCIUM 7.5* 7.6* 8.4*  8.3*  MG 1.9 1.6* 2.1  PHOS 4.4 4.8* 5.7*  5.5*     Sepsis Markers  Recent Labs Lab 12/31/14 1830 01/02/15 2320  LATICACIDVEN 0.8 0.8   ABG  Recent Labs Lab 12/28/14 0710  PHART 7.442  PCO2ART 42.9  PO2ART 76.0*   Liver Enzymes  Recent Labs Lab 12/31/14 1830 01/01/15 0500 01/02/15 0432 01/03/15 0550  AST 14*  --   --   --   ALT 9*  --   --    --   ALKPHOS 117  --   --   --   BILITOT 0.3  --   --   --   ALBUMIN 1.6* 1.5* 1.6* 2.2*   Cardiac Enzymes  Recent Labs Lab 12/28/14 0415 12/28/14 1005 12/28/14 1700  TROPONINI <0.03 0.03 0.03     Glucose  Recent Labs Lab 01/02/15 1549 01/02/15 1942 01/02/15 1945 01/02/15 1951 01/02/15 2330 01/03/15 0406  GLUCAP 85 50* 38* 83 114* 112*    Imaging Ct Abdomen Pelvis Wo Contrast  01/02/2015   CLINICAL DATA:  Patient with new anemia this morning. Vasopressor requirement  unchanged. Unsuccessful with diuresis with Lasix AND albumin. Administering a second unit of packed red blood cells.  History of multiple small bowel surgeries and a partial colectomy. Hysterectomy and appendectomy.  EXAM: CT ABDOMEN AND PELVIS WITHOUT CONTRAST  TECHNIQUE: Multidetector CT imaging of the abdomen and pelvis was performed following the standard protocol without IV contrast.  COMPARISON:  Abdomen radiographs, 12/31/2014.  CT, 05/22/2012  FINDINGS: Lung bases: There are small pleural effusions. There is consolidation of the visible lower lobes and the dependent portion of the right middle lobe. There are irregular hazy and reticular peribronchovascular opacities in the aerated portions of the lung bases.  Liver: Unremarkable  Spleen: Enlarged measuring 15.6 cm in greatest dimension. No splenic mass or focal lesion.  Gallbladder: Not well visualized due to adjacent fluid-filled bowel. Possible gallstone. No convincing acute cholecystitis. No bile duct dilation.  Pancreas:  Unremarkable.  Adrenal glands:  No masses.  Kidneys, ureters, bladder: Kidneys in ureters are unremarkable. Bladder not well evaluated being only mildly distended and partly obscured by left hip prosthesis artifact. No gross bladder abnormality. Foley catheter projects within the bladder.  Lymph nodes: There are prominent and mildly enlarged portal caval lymph nodes, similar to the prior exam.  Ascites: Small to moderate amount ascites is seen  adjacent to the liver and spleen, along the pericolic gutters and between the use of small bowel mesentery and collecting in the pelvis. Diffuse edema is noted throughout the mesenteric.  Gastrointestinal: Bowel anastomosis staple line lies in the central pelvis forming a Hartman's pouch of the remaining lower sigmoid colon. Right colon has been diverted into a right mid abdomen colostomy. There is an ileocolic anastomosis in the right mid to lower quadrant. Small bowel is normal in caliber. There is no evidence of obstruction.  Abdominal wall: There is a defect along the anterior abdominal wall. Only the fashion appears to cover the underlying peritoneal cavity. This is new from the prior exam. Loculated fluid collections are noted throughout the right lateral abdominal wall extending to the level of the right iliac crest. Largest collection lies just above the right iliac crest measuring 12 x 7.4 x 12 cm in size. It is likely  contiguous with the collections extend over the right antral lateral abdominal wall all the way superior to the level of the mid liver. These collections are new the prior CT.  There is diffuse subcutaneous edema.  Musculoskeletal: Left hip prosthesis, incompletely imaged but appears be well aligned. There is some subchondral cystic change along the right femoral head. Degenerative changes are noted throughout the visualized spine including a grade 1 anterolisthesis of L4 on L5. There is a moderate lumbar levoscoliosis. Bones are diffusely demineralized.  IMPRESSION: 1. There multiple abnormalities. 2. Fluid collections are noted along the right anterior and lateral abdominal wall extending from the anterior right upper quadrant to the right lateral lower abdomen above the right iliac crest. These could be infected collections. They could reflect seromas from prior hemorrhage given the history of anemia. Hounsfield unit measurement of these collections show them to be water density. 3.  Anasarca with a small to moderate amount of ascites. Small pleural effusions. Diffuse soft tissue edema. 4. Atelectasis of the visible lower lobes. Partial atelectasis of the right middle lobe. Abnormal densities in the aerated portions of the visible lung bases each could reflect infection or inflammatory change or possibly interstitial edema. 5. Mild splenomegaly of with the spleen increased in size from the prior CT. 6. Possible gallstone.   Electronically Signed   By: Lajean Manes M.D.   On: 01/02/2015 19:13   Dg Chest Port 1 View  01/03/2015   CLINICAL DATA:  Pulmonary edema.  History of hypertension and COPD.  EXAM: PORTABLE CHEST - 1 VIEW  COMPARISON:  01/01/2015 and 12/31/2014.  FINDINGS: 0511 hours. The endotracheal tube, right arm PICC and nasogastric tube appear unchanged. The heart size and mediastinal contours are stable. There are persistent bilateral pleural effusions with associated probable bibasilar atelectasis and diffuse perihilar airspace opacities. No pneumothorax is demonstrated. Bilateral cervical ribs noted.  IMPRESSION: No significant change in probable congestive heart failure with bilateral airspace opacities and pleural effusions. Stable support system.   Electronically Signed   By: Richardean Sale M.D.   On: 01/03/2015 08:59  PCXR:   ASSESSMENT / PLAN:  PULMONARY ETT 9/06 >> 9/10 ETT 9/11 >>  A: Acute Hypoxic Respiratory Failure - Multifactorial from Pneumonia & now Volume Overload Bilateral Pleural Effusions - S/P Thoracentesis/Transudate. Presumed COPD - H/O Tobacco Use.  P:   Continue PS trials as tolerates  Plan for Perc Trach on Monday Continuing scheduled Duonebs Continuing scheduled Budesonide nebs Oxygen to keep SpO2 > 92%  CARDIOVASCULAR Rt PICC 9/06 >> A:  A-Fib with RVR- converted to NSR. Currently ST.  Shock - Likely ongoing due to sedation H/O hypertension  P:  Monitoring patient on telemetry Wean off pressors to keep MAP > 65 Continue  ASA Holding heparin gtt given anemia Consider Coumadin vs. NOAC in future   RENAL A:   Acute Renal Failure Hypervolemia - due to hypoalbuminemia Metabolic acidosis - resolved. Hypokalemia - resolved Hypomagnesemia - resolved.  P:   Trending electrolytes daily Monitoring UOP w/ foley catheter Monitoring daily BUN/Creatinine  GASTROINTESTINAL A:   Chronic diarrhea with C diff colonization Hypoalbuminemia  N/V - Holding tube feedings Hx of GERD  P:   Holding Tube Feedings for Emesis OGT to suction Protonix IV daily Zofran IV prn  HEMATOLOGIC A:   Anemia - No signs of active bleeding on CT. S/P 2u PRBC 9/16.  Hx of CMML with chronic anemia.  P:  Trend Hgb daily w/ CBC Repeat Hgb/Hct @ 1500  INFECTIOUS  A:   Septic shock - Secondary to Pneumonia & questionable bacteremia. Questionable Streptococcal Pneumonia - Completed Tressie Ellis 9/14 Streptococcal Viridans Bacteremia - Completed Tressie Ellis 9/14  Cdiff colonization  P:   Trending Leukocytosis Monitoring for Fever Pan Culture for evidence of fever  Blood Ctx 9/13>> R Pleural Effusion 9/13>>Negative  BAL 9/7>>Bacteria Neg/AFB pending/Fungal pending  Blood Ctx 9/5>> 1/2 positive for Strep Viridans Urine Strep Ag 9/5>>negative Urine Leg Ag 9/5>>negative  ENDOCRINE A: Hyperglycemia  P: Monitor BG & RN to notify for <90 or >160  NEUROLOGY A: Chronic pain. Depression  P: RASS goal: 0 to -1 Propofol gtt Fentanyl IV prn   TODAYS SUMMARY:  64 year old female with atrial fibrillation and pneumonia with repeat intubation due to hypoxic respiratory failure. Patient has had failure to wean likely owing to pulmonary edema from hypoalbuminemic state. We're unsuccessful in diuresis. Patient was transfused 2 units of packed red blood cells yesterday for anemia. The etiology for the anemia is unclear. I'm holding tube feeds at this time as the patient did have nausea and vomiting today. CT scan of the abdomen was  unrevealing from yesterday. She is currently planned for a percutaneous tracheostomy on Monday. Repeat cultures from 9/13 have still showed no evidence of an infectious etiology and I suspect her rising leukocytosis from yesterday was due to her blood transfusion.  I have spent a total of 31 minutes of critical care time today caring for this patient and reviewing the patient's electronic medical record.  Sonia Baller Ashok Cordia, M.D. [facility] Pager:  [phone] After 3pm or if no response, call [phone]

## 2015-01-03 NOTE — Progress Notes (Signed)
CRITICAL VALUE ALERT  Critical value received:  hgb 6.1  Date of notification:  01/03/15  Time of notification:  15:45  Critical value read back:Yes.    Nurse who received alert:  Odis Hollingshead, RN  MD notified (1st page):  Dr. Ashok Cordia  Time of first page:  15:45  MD notified (2nd page):  Time of second page:  Responding MD:  Dr. Ashok Cordia  Time MD responded:  15:55

## 2015-01-03 NOTE — Progress Notes (Signed)
eLink Physician-Brief Progress Note Patient Name: [name] DOB: 1950-04-19 MRN: [id number]   Date of Service  01/03/2015  HPI/Events of Note  Procalcitonin 26.94.  eICU Interventions  Blood, Urine, & Resp ctx. Starting Linezolid, Aztreonam, & Flagyl.     Intervention Category Major Interventions: Sepsis - evaluation and management  Tera Partridge 01/03/2015, 10:55 PM

## 2015-01-03 NOTE — Progress Notes (Signed)
eLink Physician-Brief Progress Note Patient Name: [name] DOB: 09/29/1950 MRN: [id number]   Date of Service  01/03/2015  HPI/Events of Note  Hgb now 6.1 post code. TTE being done now. Weaning vasopressor.  eICU Interventions  Transfusing 1u PRBC & repeat Hgb/Hct after transfusion.     Intervention Category Major Interventions: Shock - evaluation and management  Tera Partridge 01/03/2015, 4:00 PM

## 2015-01-03 NOTE — Progress Notes (Signed)
PCCM Attending Code Blue Note:  Patient's nurse reports that when she set the patient up to suction with her OG tube she immediately became bradycardic and hypotensive. At that time she reports she became pulseless. CPR was initiated. On my arrival 1 mg of epinephrine was administered. On repeat pulse checked the patient was found to be pulseless and algorithm was resumed. Further epinephrine was given. The patient's norepinephrine infusion was increased. Patient had return of spontaneous circulation with cardiac rhythm visualized with bedside thoracic ultrasound as well as palpable pulse. Patient currently nonresponsive. Sinus tachycardia on EKG. Checking serum workup including lactic acid & cardiac biomarkers. Also checking stat portable chest x-ray and KUB. OG tube continued to wall suction continuously. Family notified by nursing staff and on their way.  Sonia Baller Ashok Cordia, M.D. [facility] Pager:  [phone] After 3pm or if no response, call [phone]

## 2015-01-03 NOTE — Progress Notes (Signed)
CRITICAL VALUE ALERT  Critical value received:  Lactic acid 5.6  Date of notification:  01/03/15  Time of notification:  11:00  Critical value read back:Yes.    Nurse who received alert:  Juel Burrow, RN  MD notified (1st page):  Dr. Ashok Cordia  Time of first page:  11:00  MD notified (2nd page):  Time of second page:  Responding MD:  Dr. Ashok Cordia  Time MD responded:  11:00

## 2015-01-03 NOTE — Progress Notes (Signed)
ANTIBIOTIC CONSULT NOTE - INITIAL  Pharmacy Consult for Aztreonam Indication: sepsis  Allergies  Allergen Reactions  . Vancomycin Hives and Rash    wheezing  . Ativan [Lorazepam] Other (See Comments)    "makes me crazy"  . Codeine Nausea And Vomiting  . Digoxin And Related Swelling  . Tetanus Toxoids Other (See Comments)    Serum reaction  . Penicillins Hives and Rash  . Xarelto [Rivaroxaban] Hives and Rash    (May not be allergic. Vancomycin was also being taken when reaction occurred.)    Patient Measurements: Height: 5\' 5"  (165.1 cm) Weight: 185 lb 3 oz (84 kg) IBW/kg (Calculated) : 57 Adjusted Body Weight:   Vital Signs: Temp: 100.2 F (37.9 C) (09/17 2031) Temp Source: Axillary (09/17 2031) BP: 132/61 mmHg (09/17 2043) Pulse Rate: 113 (09/17 2043) Intake/Output from previous day: 09/16 0701 - 09/17 0700 In: 4105.1 [P.O.:625; I.V.:1221.9; Blood:858.2; NG/GT:1300; IV Piggyback:100] Out: 2225 [Urine:1775; Stool:450] Intake/Output from this shift: Total I/O In: 24.4 [I.V.:24.4] Out: -   Labs:  Recent Labs  01/03/15 0031 01/03/15 0550 01/03/15 1020 01/03/15 1502 01/03/15 2100  WBC 21.4* 24.9* 45.5*  --   --   HGB 7.0* 6.8* 7.7* 6.1* 8.2*  PLT 243 260 157  --   --   CREATININE  --  1.58*  1.56* 1.74* 1.73*  --    Estimated Creatinine Clearance: 34.7 mL/min (by C-G formula based on Cr of 1.73). No results for input(s): VANCOTROUGH, VANCOPEAK, VANCORANDOM, GENTTROUGH, GENTPEAK, GENTRANDOM, TOBRATROUGH, TOBRAPEAK, TOBRARND, AMIKACINPEAK, AMIKACINTROU, AMIKACIN in the last 72 hours.   Microbiology: Recent Results (from the past 720 hour(s))  TECHNOLOGIST REVIEW     Status: None   Collection Time: 12/11/14 10:49 AM  Result Value Ref Range Status   Technologist Review   Final    Metas and Myelocytes, 1% nrbc, mod teardrops and ovalocytes, few shistocytes present  Blood culture (routine x 2)     Status: None   Collection Time: 01/06/2015  6:32 PM  Result  Value Ref Range Status   Specimen Description BLOOD LEFT ANTECUBITAL  Final   Special Requests BOTTLES DRAWN AEROBIC AND ANAEROBIC 5CC  Final   Culture  Setup Time   Final    GRAM VARIABLE COCCOBACILLI AEROBIC BOTTLE ONLY CRITICAL RESULT CALLED TO, READ BACK BY AND VERIFIED WITH: D ALDRIDGE,RN AT 1220 12/24/14 BY L BENFIELD    Culture   Final    VIRIDANS STREPTOCOCCUS THE SIGNIFICANCE OF ISOLATING THIS ORGANISM FROM A SINGLE SET OF BLOOD CULTURES WHEN MULTIPLE SETS ARE DRAWN IS UNCERTAIN. PLEASE NOTIFY THE MICROBIOLOGY DEPARTMENT WITHIN ONE WEEK IF SPECIATION AND SENSITIVITIES ARE REQUIRED. Performed at [facility]    Report Status 12/26/2014 FINAL  Final  Blood culture (routine x 2)     Status: None   Collection Time: 01/03/2015  8:55 PM  Result Value Ref Range Status   Specimen Description BLOOD LEFT HAND  Final   Special Requests BOTTLES DRAWN AEROBIC ONLY 5CC  Final   Culture   Final    NO GROWTH 5 DAYS Performed at [facility]    Report Status 12/28/2014 FINAL  Final  C difficile quick scan w PCR reflex     Status: Abnormal   Collection Time: 01/07/2015  9:08 PM  Result Value Ref Range Status   C Diff antigen POSITIVE (A) NEGATIVE Final   C Diff toxin NEGATIVE NEGATIVE Final   C Diff interpretation   Final    C. difficile present, but  toxin not detected. This indicates colonization. In most cases, this does not require treatment. If patient has signs and symptoms consistent with colitis, consider treatment.    Comment: RESULT CALLED TO, READ BACK BY AND VERIFIED WITH: Q MBEMENA RN [id number] 12/31/2014 A NAVARRO   MRSA PCR Screening     Status: Abnormal   Collection Time: 12/21/2014  9:46 PM  Result Value Ref Range Status   MRSA by PCR POSITIVE (A) NEGATIVE Final    Comment:        The GeneXpert MRSA Assay (FDA approved for NASAL specimens only), is one component of a comprehensive MRSA colonization surveillance program. It is not intended to diagnose MRSA infection  nor to guide or monitor treatment for MRSA infections. RESULT CALLED TO, READ BACK BY AND VERIFIED WITH: Q MBEMENA RN [id number] 12/23/14 A NAVARRO   Culture, bal-quantitative     Status: None   Collection Time: 12/24/14 10:47 AM  Result Value Ref Range Status   Specimen Description BRONCHIAL ALVEOLAR LAVAGE  Final   Special Requests Immunocompromised  Final   Gram Stain   Final    RARE WBC PRESENT, PREDOMINANTLY MONONUCLEAR RARE SQUAMOUS EPITHELIAL CELLS PRESENT NO ORGANISMS SEEN Performed at [place] NO GROWTH Performed at [facility]   Final   Culture   Final    NO GROWTH 2 DAYS Performed at [facility]    Report Status 12/26/2014 FINAL  Final  Fungus Culture with Smear     Status: None (Preliminary result)   Collection Time: 12/24/14 10:47 AM  Result Value Ref Range Status   Specimen Description BRONCHIAL ALVEOLAR LAVAGE  Final   Special Requests Immunocompromised  Final   Fungal Smear   Final    NO YEAST OR FUNGAL ELEMENTS SEEN Performed at [facility]    Culture   Final    CANDIDA ALBICANS Performed at [facility]    Report Status PENDING  Incomplete  AFB culture with smear     Status: None (Preliminary result)   Collection Time: 12/24/14 10:47 AM  Result Value Ref Range Status   Specimen Description BRONCHIAL ALVEOLAR LAVAGE  Final   Special Requests Immunocompromised  Final   Acid Fast Smear   Final    NO ACID FAST BACILLI SEEN Performed at [facility]    Culture   Final    CULTURE WILL BE EXAMINED FOR 6 WEEKS BEFORE ISSUING A FINAL REPORT Performed at [facility]    Report Status PENDING  Incomplete  Body fluid culture     Status: None   Collection Time: 12/30/14 12:34 PM  Result Value Ref Range Status   Specimen Description PLEURAL  Final   Special Requests Normal  Final   Gram Stain   Final    MODERATE WBC PRESENT, PREDOMINANTLY MONONUCLEAR NO ORGANISMS SEEN    Culture    Final    NO GROWTH 3 DAYS Performed at [facility]    Report Status 01/02/2015 FINAL  Final  Culture, blood (routine x 2)     Status: None (Preliminary result)   Collection Time: 12/30/14  5:15 PM  Result Value Ref Range Status   Specimen Description BLOOD LEFT HAND  Final   Special Requests IN PEDIATRIC BOTTLE  2CC  Final   Culture   Final    NO GROWTH 4 DAYS Performed at [facility]    Report Status PENDING  Incomplete  Culture, blood (  routine x 2)     Status: None (Preliminary result)   Collection Time: 12/30/14  5:30 PM  Result Value Ref Range Status   Specimen Description BLOOD LEFT HAND  Final   Special Requests IN PEDIATRIC BOTTLE  .5CC  Final   Culture   Final    NO GROWTH 4 DAYS Performed at [facility]    Report Status PENDING  Incomplete    Medical History: Past Medical History  Diagnosis Date  . Hypertension     She has a past hx of essential  . 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 Inflammation of joint of knee     Since we last last saw her she developed problems with an acute which required surgical drainage by her orthopedist Dr. Durward Fortes.  Marland Kitchen MRSA (methicillin resistant Staphylococcus aureus)     Knee surgery drainage was positive for MRSA and she was treated with 3 weeks of doxycycline successfully.  . Diarrhea     Mild  . Exogenous obesity   . GERD (gastroesophageal reflux disease)     2 hosp.- ischemic colitis - residual of Norovirus, 05/2011- sm. bowel obstruction  . Headache(784.0)     migraine headache on occas, less now than when she was younger   . Arthritis     L hip, back, neck   . History of blood transfusion sept 2013    04/2011- /w ischemic colitis , trouble with matching blood  sept 2013  . Anemia     will see hematology consult prior to surgery, recommended by Dr. Mare Ferrari  . Anemia 12/15/2010  . Ischemic colitis 01/31/2012  . Atrial  flutter     during hospitalization, 04/2011- related to anemia & illness/stress   . Pneumonia     04/2011- not hospitalized , pt. denies SOB, changes in chest, breathing  . 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 Dizziness     occasional  . B12 deficiency 02/27/2013  . 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 COPD (chronic obstructive pulmonary disease) 09/26/2013    Medications:  Anti-infectives    Start     Dose/Rate Route Frequency Ordered Stop   01/04/15 0600  aztreonam (AZACTAM) 1 g in dextrose 5 % 50 mL IVPB     1 g 100 mL/hr over 30 Minutes Intravenous 3 times per day 01/03/15 2255     01/03/15 2300  linezolid (ZYVOX) IVPB 600 mg     600 mg 300 mL/hr over 60 Minutes Intravenous Every 12 hours 01/03/15 2248     01/03/15 2300  metroNIDAZOLE (FLAGYL) IVPB 500 mg     500 mg 100 mL/hr over 60 Minutes Intravenous 3 times per day 01/03/15 2248     01/03/15 2300  aztreonam (AZACTAM) 2 g in dextrose 5 % 50 mL IVPB     2 g 100 mL/hr over 30 Minutes Intravenous  Once 01/03/15 2254     12/24/14 1800  tobramycin (NEBCIN) 400 mg in dextrose 5 % 100 mL IVPB     7 mg/kg  57 kg (Ideal) 110 mL/hr over 60 Minutes Intravenous  Once 12/24/14 1710 12/24/14 1842   12/23/14 1345  cefTAZidime (FORTAZ) 2 g in dextrose 5 % 50 mL IVPB  Status:  Discontinued     2 g 100 mL/hr over 30 Minutes Intravenous Every 12 hours 12/23/14 1340 12/31/14 1044  01/03/2015 2000  linezolid (ZYVOX) IVPB 600 mg  Status:  Discontinued     600 mg 300 mL/hr over 60 Minutes Intravenous Every 12 hours 12/26/2014 1821 12/26/14 1545   12/19/2014 1845  cefTAZidime (FORTAZ) 2 g in dextrose 5 % 50 mL IVPB  Status:  Discontinued     2 g 100 mL/hr over 30 Minutes Intravenous Daily 12/18/2014 1839 12/23/14 1340   12/21/2014 1745  levofloxacin (LEVAQUIN) IVPB 750 mg  Status:  Discontinued     750 mg 100 mL/hr over 90 Minutes Intravenous  Once 01/15/2015 1744 01/16/2015 1820     Assessment: ICU patient with sepsis and  MD starting linezolid, flagyl, and aztreonam (per pharmacy).  Patient with reduced renal function.    Goal of Therapy:  Aztreonam dosed based on patient weight and renal function   Plan:  Follow up culture results  Aztreonam 2gm iv x1, then 1gm iv q8hr  Tyler Deis, Shea Stakes Crowford 01/03/2015,10:58 PM

## 2015-01-03 NOTE — Progress Notes (Signed)
*  PRELIMINARY RESULTS* Echocardiogram 2D Echocardiogram has been performed.  Leavy Cella 01/03/2015, 4:52 PM

## 2015-01-03 NOTE — Progress Notes (Signed)
Unable to obtain ABG. 4 attempts between 2 RTs were made. Pt has low BP at this time

## 2015-01-03 NOTE — Progress Notes (Signed)
Received call from Lucina Mellow and [facility] RN reguarding critical lab value of hemoglobin at 6.1 and notified eMD Dr. Ashok Cordia of results.

## 2015-01-04 DIAGNOSIS — R6521 Severe sepsis with septic shock: Secondary | ICD-10-CM

## 2015-01-04 DIAGNOSIS — A419 Sepsis, unspecified organism: Secondary | ICD-10-CM

## 2015-01-04 LAB — RENAL FUNCTION PANEL
ANION GAP: 12 (ref 5–15)
Albumin: 1.8 g/dL — ABNORMAL LOW (ref 3.5–5.0)
BUN: 61 mg/dL — ABNORMAL HIGH (ref 6–20)
CALCIUM: 8.2 mg/dL — AB (ref 8.9–10.3)
CO2: 22 mmol/L (ref 22–32)
CREATININE: 2.03 mg/dL — AB (ref 0.44–1.00)
Chloride: 101 mmol/L (ref 101–111)
GFR, EST AFRICAN AMERICAN: 28 mL/min — AB (ref >60)
GFR, EST NON AFRICAN AMERICAN: 25 mL/min — AB (ref >60)
Glucose, Bld: 123 mg/dL — ABNORMAL HIGH (ref 65–99)
Phosphorus: 6.9 mg/dL — ABNORMAL HIGH (ref 2.5–4.6)
Potassium: 4.5 mmol/L (ref 3.5–5.1)
SODIUM: 135 mmol/L (ref 135–145)

## 2015-01-04 LAB — CULTURE, BLOOD (ROUTINE X 2)
CULTURE: NO GROWTH
Culture: NO GROWTH

## 2015-01-04 LAB — CBC
HEMATOCRIT: 24.3 % — AB (ref 36.0–46.0)
Hemoglobin: 8.1 g/dL — ABNORMAL LOW (ref 12.0–15.0)
MCH: 29.1 pg (ref 26.0–34.0)
MCHC: 33.3 g/dL (ref 30.0–36.0)
MCV: 87.4 fL (ref 78.0–100.0)
Platelets: 260 10*3/uL (ref 150–400)
RBC: 2.78 MIL/uL — ABNORMAL LOW (ref 3.87–5.11)
RDW: 20.4 % — AB (ref 11.5–15.5)
WBC: 30.2 10*3/uL — AB (ref 4.0–10.5)

## 2015-01-04 LAB — CBC WITH DIFFERENTIAL/PLATELET
BASOS ABS: 0 10*3/uL (ref 0.0–0.1)
Basophils Relative: 0 %
EOS ABS: 0 10*3/uL (ref 0.0–0.7)
Eosinophils Relative: 0 %
HCT: 20.4 % — ABNORMAL LOW (ref 36.0–46.0)
Hemoglobin: 6.9 g/dL — CL (ref 12.0–15.0)
LYMPHS ABS: 2.9 10*3/uL (ref 0.7–4.0)
Lymphocytes Relative: 8 %
MCH: 29.7 pg (ref 26.0–34.0)
MCHC: 33.8 g/dL (ref 30.0–36.0)
MCV: 87.9 fL (ref 78.0–100.0)
MONO ABS: 12.4 10*3/uL — AB (ref 0.1–1.0)
Monocytes Relative: 34 %
NEUTROS PCT: 58 %
Neutro Abs: 21.3 10*3/uL — ABNORMAL HIGH (ref 1.7–7.7)
PLATELETS: 274 10*3/uL (ref 150–400)
RBC: 2.32 MIL/uL — AB (ref 3.87–5.11)
RDW: 20.5 % — AB (ref 11.5–15.5)
WBC: 36.6 10*3/uL — AB (ref 4.0–10.5)

## 2015-01-04 LAB — GLUCOSE, CAPILLARY
GLUCOSE-CAPILLARY: 110 mg/dL — AB (ref 65–99)
GLUCOSE-CAPILLARY: 121 mg/dL — AB (ref 65–99)
Glucose-Capillary: 77 mg/dL (ref 65–99)

## 2015-01-04 LAB — TRIGLYCERIDES: TRIGLYCERIDES: 235 mg/dL — AB (ref <150)

## 2015-01-04 LAB — PROCALCITONIN: PROCALCITONIN: 29.42 ng/mL

## 2015-01-04 LAB — MAGNESIUM: MAGNESIUM: 2.2 mg/dL (ref 1.7–2.4)

## 2015-01-04 MED ORDER — DILTIAZEM LOAD VIA INFUSION
20.0000 mg | Freq: Once | INTRAVENOUS | Status: AC
  Administered 2015-01-04: 20 mg via INTRAVENOUS
  Filled 2015-01-04: qty 20

## 2015-01-04 MED ORDER — DILTIAZEM HCL 25 MG/5ML IV SOLN: 20.0000 mg | Freq: Once | INTRAVENOUS | Status: DC

## 2015-01-04 MED ORDER — SODIUM CHLORIDE 0.9 % IJ SOLN: 10.0000 mL | INTRAMUSCULAR | Status: DC | PRN

## 2015-01-04 MED ORDER — DILTIAZEM HCL 100 MG IV SOLR
5.0000 mg/h | INTRAVENOUS | Status: DC
  Administered 2015-01-04: 5 mg/h via INTRAVENOUS
  Filled 2015-01-04: qty 100

## 2015-01-04 MED ORDER — SODIUM CHLORIDE 0.9 % IJ SOLN
10.0000 mL | Freq: Two times a day (BID) | INTRAMUSCULAR | Status: DC
  Administered 2015-01-04 – 2015-01-06 (×5): 10 mL
  Administered 2015-01-07: 20 mL
  Administered 2015-01-07: 10 mL
  Administered 2015-01-08: 30 mL
  Administered 2015-01-08 – 2015-01-11 (×5): 10 mL

## 2015-01-04 MED ORDER — SODIUM CHLORIDE 0.9 % IV SOLN
1.0000 g | Freq: Once | INTRAVENOUS | Status: AC
  Administered 2015-01-04: 1 g via INTRAVENOUS
  Filled 2015-01-04: qty 10

## 2015-01-04 NOTE — Progress Notes (Signed)
PULMONARY / CRITICAL CARE MEDICINE   Name: [name] MRN: [id number] DOB: 1951-01-25    ADMISSION DATE:  12/25/2014 CONSULTATION DATE:  12/23/2014  REFERRING MD :  Dr. Karleen Hampshire   CHIEF COMPLAINT:  Shortness of breath   INITIAL PRESENTATION:  64 yo female smoker with dyspnea, VDRF from pneumococcal PNA and septic shock.  She has hx of HTN, GERD, PAF, CMML, COPD.  STUDIES:  9/05  CT Chest w/o - diffuse bilateral airspace disease, consolidation involving all lobes of L/R lung, several discrete nodules present in upper lobes, bulky mediastinal adenopathy  9/17 TTE - LV normal in size. Moderate concentric LVH. Appearance consistent with hypertrophic cardiomyopathy. EF 80-85%. No regional wall motion abnormalities. Grade 1 diastolic dysfunction. Dynamic obstruction. No significant change from prior echocardiogram except for increased EF. No pericardial effusion. IVC normal in size. Pulmonary artery systolic pressure normal. RV normal in size and function.  SIGNIFICANT EVENTS: 9/05  Admit with worsening SOB  9/06 intubated for hypoxia while receiving fluid bolus ? Edema, levo gtt 9/10 Extubated, transfuse 1 unit PRBC 9/11 A fib with RVR >> started amiodarone/levophed, respiratory failure >> re-intubated 9/12 mottled (hx of Raynauds) ECHO pending. Appropriate. Scr bumped.  9/13 Diagnostic/Therapeutic right Thoracentesis. 650 ML clear appearing. Transudate by [facility]   9/14 lasix for neg volume, weaning.  9/15 lasix and albumin. Passed SBT but had sig WOB and tachycardia.  9/16 failed SBT. Still positive volume in spite of lasix. Still on low dose pressors but may be r/t diprivan. Albumin/lasix x 3 doses. Family updated. Will likely need trach. Anemic. Getting 2 units blood.  9/17 Bradycardic arrest after N/V & probable aspiration  SUBJECTIVE: Patient had bradycardic arrest yesterday. Underwent resuscitation. She is awake and grossly nonfocal this morning she is nodding to questions. She  does report chest discomfort.  ROS:  Unobtainable as the patient is currently intubated with sedation with propofol.  VITAL SIGNS: Temp:  [98.4 F (36.9 C)-100.2 F (37.9 C)] 100 F (37.8 C) (09/18 0501) Pulse Rate:  [104-123] 104 (09/18 0815) Resp:  [12-36] 28 (09/18 0845) BP: (44-152)/(16-124) 141/67 mmHg (09/18 0845) SpO2:  [22 %-100 %] 99 % (09/18 0845) FiO2 (%):  [30 %-100 %] 40 % (09/18 0816) Weight:  [83.9 kg (184 lb 15.5 oz)] 83.9 kg (184 lb 15.5 oz) (09/18 0501)      VENTILATOR SETTINGS: Vent Mode:  [-] PRVC FiO2 (%):  [30 %-100 %] 40 % Set Rate:  [28 bmp] 28 bmp Vt Set:  [500 mL] 500 mL PEEP:  [5 cmH20] 5 cmH20 Plateau Pressure:  [20 cmH20-23 cmH20] 21 cmH20   INTAKE / OUTPUT:  Intake/Output Summary (Last 24 hours) at 01/04/15 0902 Last data filed at 01/04/15 0600  Gross per 24 hour  Intake 2580.29 ml  Output   1955 ml  Net 625.29 ml    PHYSICAL EXAMINATION: General:  Awake. No distress. Integument:  Warm & dry. No rash on exposed skin. Mottling & cyanosis of bilateral upper extremity & lower extremity digits. HEENT:  No scleral injection or icterus. Endotracheal tube in place. PERRL. Cardiovascular:  Tachycardic.  Sinus tachycardia on telemetry. Anasarca persists. Pulmonary:  Coarse breath sounds bilaterally. Symmetric chest wall rise on ventilator. Abdomen: Soft. Normal bowel sounds. Nondistended. Right-sided ostomy in place. Neurological:  Moving all 4 extremities equally.Cranial nerves appear grossly intact. Nods to questions.   LABS:  CBC  Recent Labs Lab 01/03/15 1020  01/03/15 2100 01/04/15 0500 01/04/15 0810  WBC 45.5*  --   --  36.6* 30.2*  HGB 7.7*  < > 8.2* 6.9* 8.1*  HCT 24.7*  < > 24.5* 20.4* 24.3*  PLT 157  --   --  274 260  < > = values in this interval not displayed. BMET  Recent Labs Lab 01/03/15 1020 01/03/15 1502 01/04/15 0500  NA 131* 133* 135  K 5.9* 4.2 4.5  CL 103 103 101  CO2 13* 22 22  BUN 51* 57* 61*   CREATININE 1.74* 1.73* 2.03*  GLUCOSE 193* 129* 123*   Electrolytes  Recent Labs Lab 01/03/15 0550 01/03/15 1020 01/03/15 1502 01/04/15 0500  CALCIUM 8.4*  8.3* 8.7* 7.8* 8.2*  MG 2.1 2.7*  --  2.2  PHOS 5.7*  5.5* 7.2*  --  6.9*     Sepsis Markers  Recent Labs Lab 01/02/15 2320 01/03/15 1000 01/03/15 1615 01/03/15 2100 01/04/15 0500  LATICACIDVEN 0.8 5.7* 1.6  --   --   PROCALCITON  --   --   --  26.94 29.42   ABG No results for input(s): PHART, PCO2ART, PO2ART in the last 168 hours. Liver Enzymes  Recent Labs Lab 12/31/14 1830  01/03/15 0550 01/03/15 1020 01/04/15 0500  AST 14*  --   --  30  --   ALT 9*  --   --  13*  --   ALKPHOS 117  --   --  163*  --   BILITOT 0.3  --   --  0.8  --   ALBUMIN 1.6*  < > 2.2* 2.2* 1.8*  < > = values in this interval not displayed. Cardiac Enzymes  Recent Labs Lab 01/03/15 1020 01/03/15 1615 01/03/15 2100  TROPONINI 0.04* 0.41* 0.33*     Glucose  Recent Labs Lab 01/03/15 0803 01/03/15 1238 01/03/15 1623 01/03/15 2054 01/03/15 2326 01/04/15 0458  GLUCAP 128* 142* 122* 138* 110* 121*    Imaging Dg Chest Port 1 View  01/03/2015   CLINICAL DATA:  Cardiac arrest.  EXAM: PORTABLE ABDOMEN - 1 VIEW; PORTABLE CHEST - 1 VIEW  COMPARISON:  CT scan 01/02/2015  FINDINGS: External pacer paddles are noted over the heart. There is an NG tube in the stomach. The tip is in the antropyloric region. The bowel gas pattern is grossly normal.  IMPRESSION: NG tube tip is in the antropyloric region of the stomach.   Electronically Signed   By: Marijo Sanes M.D.   On: 01/03/2015 10:57   Dg Abd Portable 1v  01/03/2015   CLINICAL DATA:  Cardiac arrest.  EXAM: PORTABLE ABDOMEN - 1 VIEW; PORTABLE CHEST - 1 VIEW  COMPARISON:  CT scan 01/02/2015  FINDINGS: External pacer paddles are noted over the heart. There is an NG tube in the stomach. The tip is in the antropyloric region. The bowel gas pattern is grossly normal.  IMPRESSION: NG  tube tip is in the antropyloric region of the stomach.   Electronically Signed   By: Marijo Sanes M.D.   On: 01/03/2015 10:57  PCXR:   ASSESSMENT / PLAN:  PULMONARY ETT 9/06 >> 9/10 ETT 9/11 >>  A: Acute Hypoxic Respiratory Failure - Multifactorial from Pneumonia & now Volume Overload Bilateral Pleural Effusions - S/P Thoracentesis/Transudate. Presumed COPD - H/O Tobacco Use.  P:   Continue PS trials as tolerates  Plan for Perc Trach on Monday Continuing scheduled Duonebs Continuing scheduled Budesonide nebs Oxygen to keep SpO2 > 92%  CARDIOVASCULAR Rt PICC 9/06 >> A:  Bradycardic Arrest 9/17 - Likely due to aspiration. A-Fib  with RVR- converted to NSR. Currently ST.  Shock - Likely ongoing due to sedation H/O hypertension  P:  Monitoring patient on telemetry Wean off pressors to keep MAP > 65 Holding ASA Holding heparin gtt given anemia  RENAL A:   Acute Renal Failure - worsening. Hypervolemia - due to hypoalbuminemia Metabolic acidosis - resolved. Hypokalemia - resolved Hypomagnesemia - resolved.  P:   Trending electrolytes daily Monitoring UOP w/ foley catheter Monitoring daily BUN/Creatinine  GASTROINTESTINAL A:   Chronic diarrhea with C diff colonization Hypoalbuminemia  N/V - Holding tube feedings Hx of GERD  P:   Holding Tube Feedings for Emesis OGT to suction Protonix IV daily Zofran IV prn See ID  HEMATOLOGIC A:   Anemia - No signs of active bleeding on CT. S/P 2u PRBC 9/16. & 1u PRBC 9/17. Hx of CMML with chronic anemia.  P:  Trend Hgb daily w/ CBC SCDs  INFECTIOUS A:   Septic shock - Secondary to Pneumonia & questionable bacteremia. Questionable Streptococcal Pneumonia - Completed Tressie Ellis 9/14 Streptococcal Viridans Bacteremia - Completed Tressie Ellis 9/14  Cdiff colonization  P:   Trending Leukocytosis Procalcitonin algorithm Re-cultured 9/17 Antibiotics restarted 9/17  Linezolid 9/17>> Aztreonam 9/17>> Flagyl IV  9/17>>  Blood Ctx 9/17>> Urine Ctx 9/17>> Resp Ctx 9/17>>  Blood Ctx 9/13>> R Pleural Effusion 9/13>>Negative  BAL 9/7>>Bacteria Neg/AFB pending/Fungal pending  Blood Ctx 9/5>> 1/2 positive for Strep Viridans Urine Strep Ag 9/5>>negative Urine Leg Ag 9/5>>negative  ENDOCRINE A: Hyperglycemia  P: Monitor BG & RN to notify for <90 or >160  NEUROLOGY A: Chest Pain post resuscitation H/O Chronic pain Depression  P: RASS goal: 0 to -1 Propofol gtt Fentanyl IV prn   TODAYS SUMMARY:  64 year old female with atrial fibrillation and pneumonia with repeat intubation due to hypoxic respiratory failure. Patient has had failure to wean likely owing to pulmonary edema from hypoalbuminemic state. Patient suffered bradycardic arrest on 9/17 after episode of emesis. Patient had no evidence of bowel obstruction or residual tube feeds prior to this event. With elevation of Procalcitonin and no obvious source for shock otherwise in the face of leukocytosis I have started broad-spectrum antibiotics after repeating cultures. Clinical prognosis remains guarded given multiple medical problems. I did speak with the patient's family yesterday after the cardiac arrest updating them on her current clinical state at bedside.  I have spent a total of 34 minutes of critical care time today caring for this patient and reviewing the patient's electronic medical record.  Sonia Baller Ashok Cordia, M.D. [facility] Pager:  [phone] After 3pm or if no response, call [phone]

## 2015-01-05 ENCOUNTER — Inpatient Hospital Stay (HOSPITAL_COMMUNITY): Payer: 59

## 2015-01-05 ENCOUNTER — Other Ambulatory Visit (HOSPITAL_COMMUNITY): Payer: 59

## 2015-01-05 DIAGNOSIS — I4891 Unspecified atrial fibrillation: Secondary | ICD-10-CM

## 2015-01-05 DIAGNOSIS — D6489 Other specified anemias: Secondary | ICD-10-CM

## 2015-01-05 LAB — CBC WITH DIFFERENTIAL/PLATELET
BASOS PCT: 0 %
Basophils Absolute: 0 10*3/uL (ref 0.0–0.1)
EOS ABS: 0 10*3/uL (ref 0.0–0.7)
EOS PCT: 0 %
HCT: 17.4 % — ABNORMAL LOW (ref 36.0–46.0)
Hemoglobin: 5.9 g/dL — CL (ref 12.0–15.0)
Lymphocytes Relative: 17 %
Lymphs Abs: 3.7 10*3/uL (ref 0.7–4.0)
MCH: 29.9 pg (ref 26.0–34.0)
MCHC: 33.9 g/dL (ref 30.0–36.0)
MCV: 88.3 fL (ref 78.0–100.0)
MONO ABS: 4.6 10*3/uL — AB (ref 0.1–1.0)
Monocytes Relative: 21 %
NEUTROS ABS: 13.5 10*3/uL — AB (ref 1.7–7.7)
NEUTROS PCT: 62 %
PLATELETS: 261 10*3/uL (ref 150–400)
RBC: 1.97 MIL/uL — ABNORMAL LOW (ref 3.87–5.11)
RDW: 20.9 % — ABNORMAL HIGH (ref 11.5–15.5)
WBC: 21.8 10*3/uL — ABNORMAL HIGH (ref 4.0–10.5)

## 2015-01-05 LAB — CBC
HCT: 21.2 % — ABNORMAL LOW (ref 36.0–46.0)
Hemoglobin: 7 g/dL — ABNORMAL LOW (ref 12.0–15.0)
MCH: 28.5 pg (ref 26.0–34.0)
MCHC: 33 g/dL (ref 30.0–36.0)
MCV: 86.2 fL (ref 78.0–100.0)
PLATELETS: 233 10*3/uL (ref 150–400)
RBC: 2.46 MIL/uL — AB (ref 3.87–5.11)
RDW: 19.8 % — ABNORMAL HIGH (ref 11.5–15.5)
WBC: 15.4 10*3/uL — AB (ref 4.0–10.5)

## 2015-01-05 LAB — GLUCOSE, CAPILLARY
Glucose-Capillary: 101 mg/dL — ABNORMAL HIGH (ref 65–99)
Glucose-Capillary: 102 mg/dL — ABNORMAL HIGH (ref 65–99)
Glucose-Capillary: 109 mg/dL — ABNORMAL HIGH (ref 65–99)
Glucose-Capillary: 131 mg/dL — ABNORMAL HIGH (ref 65–99)
Glucose-Capillary: 353 mg/dL — ABNORMAL HIGH (ref 65–99)
Glucose-Capillary: 84 mg/dL (ref 65–99)

## 2015-01-05 LAB — PREPARE RBC (CROSSMATCH)

## 2015-01-05 LAB — RENAL FUNCTION PANEL
ALBUMIN: 1.7 g/dL — AB (ref 3.5–5.0)
Anion gap: 14 (ref 5–15)
BUN: 68 mg/dL — AB (ref 6–20)
CALCIUM: 8 mg/dL — AB (ref 8.9–10.3)
CO2: 19 mmol/L — AB (ref 22–32)
CREATININE: 2.54 mg/dL — AB (ref 0.44–1.00)
Chloride: 96 mmol/L — ABNORMAL LOW (ref 101–111)
GFR calc Af Amer: 22 mL/min — ABNORMAL LOW (ref >60)
GFR calc non Af Amer: 19 mL/min — ABNORMAL LOW (ref >60)
GLUCOSE: 123 mg/dL — AB (ref 65–99)
PHOSPHORUS: 7.2 mg/dL — AB (ref 2.5–4.6)
Potassium: 4.5 mmol/L (ref 3.5–5.1)
SODIUM: 129 mmol/L — AB (ref 135–145)

## 2015-01-05 LAB — BLOOD GAS, ARTERIAL
ACID-BASE DEFICIT: 6.4 mmol/L — AB (ref 0.0–2.0)
Acid-base deficit: 11.9 mmol/L — ABNORMAL HIGH (ref 0.0–2.0)
Bicarbonate: 17.2 mEq/L — ABNORMAL LOW (ref 20.0–24.0)
Bicarbonate: 19.7 mEq/L — ABNORMAL LOW (ref 20.0–24.0)
DRAWN BY: 308601
Drawn by: 422461
FIO2: 0.4
FIO2: 0.4
MECHVT: 500 mL
MECHVT: 500 mL
O2 Saturation: 90.3 %
O2 Saturation: 98.4 %
PEEP/CPAP: 5 cmH2O
PEEP: 5 cmH2O
PO2 ART: 115 mmHg — AB (ref 80.0–100.0)
Patient temperature: 98.4
Patient temperature: 98.6
RATE: 14 resp/min
RATE: 28 resp/min
TCO2: 16.7 mmol/L (ref 0–100)
TCO2: 20.8 mmol/L (ref 0–100)
pCO2 arterial: 27.4 mmHg — ABNORMAL LOW (ref 35.0–45.0)
pCO2 arterial: 85.4 mmHg (ref 35.0–45.0)
pH, Arterial: 6.992 — CL (ref 7.350–7.450)
pH, Arterial: 7.414 (ref 7.350–7.450)
pO2, Arterial: 89.4 mmHg (ref 80.0–100.0)

## 2015-01-05 LAB — PROCALCITONIN: PROCALCITONIN: 24.77 ng/mL

## 2015-01-05 LAB — TRIGLYCERIDES: TRIGLYCERIDES: 227 mg/dL — AB (ref <150)

## 2015-01-05 LAB — MAGNESIUM: Magnesium: 2.1 mg/dL (ref 1.7–2.4)

## 2015-01-05 MED ORDER — ARFORMOTEROL TARTRATE 15 MCG/2ML IN NEBU
15.0000 ug | INHALATION_SOLUTION | Freq: Two times a day (BID) | RESPIRATORY_TRACT | Status: DC
  Administered 2015-01-05 – 2015-01-12 (×15): 15 ug via RESPIRATORY_TRACT
  Filled 2015-01-05 (×16): qty 2

## 2015-01-05 MED ORDER — SODIUM CHLORIDE 0.9 % IV SOLN
Freq: Once | INTRAVENOUS | Status: AC
  Administered 2015-01-05: 08:00:00 via INTRAVENOUS

## 2015-01-05 MED ORDER — METHYLPREDNISOLONE SODIUM SUCC 125 MG IJ SOLR
60.0000 mg | Freq: Two times a day (BID) | INTRAMUSCULAR | Status: AC
  Administered 2015-01-05 – 2015-01-06 (×3): 60 mg via INTRAVENOUS
  Filled 2015-01-05 (×3): qty 2

## 2015-01-05 MED ORDER — PROMETHAZINE HCL 25 MG/ML IJ SOLN: 6.2500 mg | Freq: Once | INTRAMUSCULAR | Status: DC

## 2015-01-05 MED ORDER — FENTANYL CITRATE (PF) 100 MCG/2ML IJ SOLN
50.0000 ug | INTRAMUSCULAR | Status: DC | PRN
  Administered 2015-01-05 (×5): 50 ug via INTRAVENOUS
  Administered 2015-01-05: 100 ug via INTRAVENOUS
  Administered 2015-01-05: 50 ug via INTRAVENOUS
  Administered 2015-01-05: 100 ug via INTRAVENOUS
  Administered 2015-01-05: 50 ug via INTRAVENOUS
  Administered 2015-01-06 (×2): 100 ug via INTRAVENOUS
  Administered 2015-01-06: 50 ug via INTRAVENOUS
  Administered 2015-01-06 – 2015-01-07 (×11): 100 ug via INTRAVENOUS
  Administered 2015-01-07: 50 ug via INTRAVENOUS
  Administered 2015-01-07 – 2015-01-08 (×4): 100 ug via INTRAVENOUS
  Administered 2015-01-08: 75 ug via INTRAVENOUS
  Administered 2015-01-08 – 2015-01-09 (×9): 100 ug via INTRAVENOUS
  Filled 2015-01-05: qty 2
  Filled 2015-01-05: qty 4
  Filled 2015-01-05 (×37): qty 2

## 2015-01-05 MED ORDER — ALBUTEROL SULFATE (2.5 MG/3ML) 0.083% IN NEBU: 2.5000 mg | INHALATION_SOLUTION | RESPIRATORY_TRACT | Status: DC | PRN

## 2015-01-05 MED ORDER — ACETYLCYSTEINE 20 % IN SOLN
4.0000 mL | RESPIRATORY_TRACT | Status: DC
  Administered 2015-01-05 (×2): 4 mL via RESPIRATORY_TRACT
  Filled 2015-01-05 (×2): qty 4

## 2015-01-05 NOTE — Progress Notes (Signed)
Pt refused to do CPT family at bedside.

## 2015-01-05 NOTE — Progress Notes (Signed)
eLink Physician-Brief Progress Note Patient Name: [name] DOB: July 26, 1950 MRN: [id number]   Date of Service  01/05/2015  HPI/Events of Note  Rn calling  Patient has pain right infra axillary area RT suctioning purulent material out of ET tube perioidc desaturations noted  CXR done now - bialteral LL consolidation ? Rt effusion too  Pain resulting in lower levophed need  eICU Interventions  Start mucomyst Prn fent for pain     Intervention Category Major Interventions: Respiratory failure - evaluation and management  RAMASWAMY,MURALI 01/05/2015, 1:57 AM

## 2015-01-05 NOTE — Progress Notes (Addendum)
PULMONARY / CRITICAL CARE MEDICINE   Name: [name] MRN: [id number] DOB: [birth year]    ADMISSION DATE:  12/27/2014 CONSULTATION DATE:  12/23/2014  REFERRING MD :  Dr. Karleen Hampshire   CHIEF COMPLAINT:  Shortness of breath   INITIAL PRESENTATION:  64 yo female smoker with dyspnea, VDRF from pneumococcal PNA and septic shock.  She has hx of HTN, GERD, PAF, CMML, COPD.  STUDIES:  9/05  CT Chest w/o - diffuse bilateral airspace disease, consolidation involving all lobes of L/R lung, several discrete nodules present in upper lobes, bulky mediastinal adenopathy  9/17 TTE - LV normal in size. Moderate concentric LVH. Appearance consistent with hypertrophic cardiomyopathy. EF 80-85%. No regional wall motion abnormalities. Grade 1 diastolic dysfunction. Dynamic obstruction. No significant change from prior echocardiogram except for increased EF. No pericardial effusion. IVC normal in size. Pulmonary artery systolic pressure normal. RV normal in size and function.  SIGNIFICANT EVENTS: 9/05  Admit with worsening SOB  9/06  intubated for hypoxia while receiving fluid bolus ? Edema, levo gtt 9/10  Extubated, transfuse 1 unit PRBC 9/11  A fib with RVR >> started amiodarone/levophed, respiratory failure >> re-intubated 9/12  mottled (hx of Raynauds) ECHO pending. Appropriate. Scr bumped.  9/13  Diagnostic/Therapeutic right Thoracentesis. 650 ML clear appearing. Transudate by [facility]  9/14  lasix for neg volume, weaning.  9/15  lasix and albumin. Passed SBT but had sig WOB and tachycardia.  9/16  failed SBT. Still positive volume in spite of lasix. Still on low dose pressors but may be r/t diprivan. Albumin/lasix x 3 doses. Family updated. Will likely need trach. Anemic. Getting 2 units blood.  9/17  Bradycardic arrest after N/V & probable aspiration.  ACLS.   SUBJECTIVE:  Concern for pink frothy sputum in ETT.  Tx 1 unit PRBC overnight & CT Chest ordered by E Link.  Pt reports soreness & swelling  "all over".     VITAL SIGNS: Temp:  [98.2 F (36.8 C)-99.2 F (37.3 C)] 98.2 F (36.8 C) (09/19 0823) Pulse Rate:  [92-114] 100 (09/19 0830) Resp:  [0-32] 28 (09/19 0830) BP: (88-154)/(44-129) 117/61 mmHg (09/19 0830) SpO2:  [82 %-100 %] 99 % (09/19 0830) FiO2 (%):  [40 %] 40 % (09/19 0823) Weight:  [187 lb 6.3 oz (85 kg)] 187 lb 6.3 oz (85 kg) (09/19 0400)      VENTILATOR SETTINGS: Vent Mode:  [-] PRVC FiO2 (%):  [40 %] 40 % Set Rate:  [28 bmp] 28 bmp Vt Set:  [500 mL] 500 mL PEEP:  [5 cmH20] 5 cmH20 Plateau Pressure:  [20 cmH20-25 cmH20] 25 cmH20   INTAKE / OUTPUT:  Intake/Output Summary (Last 24 hours) at 01/05/15 0910 Last data filed at 01/05/15 0823  Gross per 24 hour  Intake 2227.56 ml  Output    238 ml  Net 1989.56 ml    PHYSICAL EXAMINATION: General:  Awake. No distress. Integument:  Warm & dry. No rash on exposed skin. Mottling & cyanosis of bilateral upper extremity & lower extremity digits. HEENT:  No scleral injection or icterus. Endotracheal tube in place. PERRL. Cardiovascular:  Tachycardic.  Sinus tachycardia on telemetry. Anasarca. Pulmonary:  Coarse breath sounds bilaterally. Symmetric chest wall rise on ventilator. Abdomen: Soft. Normal bowel sounds. Nondistended. Right-sided ostomy in place. Neurological:  Moving all 4 extremities equally. Cranial nerves appear grossly intact. Nods to questions.   LABS:  CBC  Recent Labs Lab 01/04/15 0500 01/04/15 0810 01/05/15 0434  WBC 36.6* 30.2* 21.8*  HGB 6.9*  8.1* 5.9*  HCT 20.4* 24.3* 17.4*  PLT 274 260 261   BMET  Recent Labs Lab 01/03/15 1502 01/04/15 0500 01/05/15 0424  NA 133* 135 129*  K 4.2 4.5 4.5  CL 103 101 96*  CO2 22 22 19*  BUN 57* 61* 68*  CREATININE 1.73* 2.03* 2.54*  GLUCOSE 129* 123* 123*   Electrolytes  Recent Labs Lab 01/03/15 1020 01/03/15 1502 01/04/15 0500 01/05/15 0424 01/05/15 0434  CALCIUM 8.7* 7.8* 8.2* 8.0*  --   MG 2.7*  --  2.2  --  2.1  PHOS 7.2*   --  6.9* 7.2*  --     Sepsis Markers  Recent Labs Lab 01/02/15 2320 01/03/15 1000 01/03/15 1615 01/03/15 2100 01/04/15 0500 01/05/15 0434  LATICACIDVEN 0.8 5.7* 1.6  --   --   --   PROCALCITON  --   --   --  26.94 29.42 24.77   ABG No results for input(s): PHART, PCO2ART, PO2ART in the last 168 hours.   Liver Enzymes  Recent Labs Lab 12/31/14 1830  01/03/15 1020 01/04/15 0500 01/05/15 0424  AST 14*  --  30  --   --   ALT 9*  --  13*  --   --   ALKPHOS 117  --  163*  --   --   BILITOT 0.3  --  0.8  --   --   ALBUMIN 1.6*  < > 2.2* 1.8* 1.7*  < > = values in this interval not displayed.   Cardiac Enzymes  Recent Labs Lab 01/03/15 1020 01/03/15 1615 01/03/15 2100  TROPONINI 0.04* 0.41* 0.33*    Glucose  Recent Labs Lab 01/04/15 0458 01/04/15 0805 01/04/15 1313 01/04/15 2048 01/05/15 0041 01/05/15 0753  GLUCAP 121* 131* 353* 77 101* 84    Imaging Dg Chest Port 1 View  01/05/2015   CLINICAL DATA:  Endotracheal tube placement. History of hypertension, atrial flutter, pneumonia, COPD.  EXAM: PORTABLE CHEST - 1 VIEW  COMPARISON:  01/03/2015  FINDINGS: Endotracheal tube tip measures 3.4 cm above the carina. Enteric tube tip is off the field of view but below the left hemidiaphragm. Right PICC catheter tip overlies the lower SVC region. No pneumothorax. Heart size is normal. Bilateral pleural effusions. Bilateral perihilar and basilar infiltration. Infiltrates and effusions appear larger than on prior study.  IMPRESSION: Appliances appear in satisfactory position. Increasing infiltrates and effusions in the lungs.   Electronically Signed   By: Lucienne Capers M.D.   On: 01/05/2015 02:00  PCXR:   ASSESSMENT / PLAN:  PULMONARY ETT 9/06 >> 9/10 ETT 9/11 >>  A: Acute Hypoxic Respiratory Failure - Multifactorial from Pneumonia, Volume Overload & acute aspiration event (9/17) Pink/Frothy Sputum - CHF vs DAH with Hgb decrease 9/19 Bilateral Pleural Effusions - S/P  Thoracentesis/Transudate. Presumed COPD - H/O Tobacco Use.  P:   Continue PS trials as tolerated Continue discussions for perc Trach - family questioning overall state of health & need for trach.  Want family discussion.  Scheduled Duonebs Budesonide nebs Oxygen to keep SpO2 > 92% Intermittent CXR Await CT chest   CARDIOVASCULAR Rt PICC 9/06 >> A:  Bradycardic Arrest 9/17 - Likely due to aspiration. A-Fib with RVR- converted to NSR. Currently ST.  Shock - Likely ongoing due to sedation H/O hypertension  P:  Monitoring patient on telemetry Wean off pressors to keep MAP > 65 Holding ASA Holding heparin gtt given anemia   RENAL A:   Acute Renal Failure -  worsening with decreased UOP. Hypervolemia - due to hypoalbuminemia Metabolic acidosis - resolved. Hypokalemia - resolved Hypomagnesemia - resolved. Hyponatremia   P:   Trending electrolytes daily Monitor UOP w/ foley catheter Monitoring daily BUN/Creatinine May need Nephrology evaluation  GASTROINTESTINAL A:   Chronic diarrhea with C diff colonization Hypoalbuminemia  N/V - Holding tube feedings Hx of GERD  P:   Hold TF with recent emesis  OGT to suction Protonix IV daily Zofran IV prn See ID  HEMATOLOGIC A:   Anemia - No signs of active bleeding on CT. S/P 2u PRBC 9/16. & 1u PRBC 9/17. Hx of CMML with chronic anemia.  P:  Trend Hgb daily w/ CBC SCDs Tx 1 unit PRBC's  INFECTIOUS A:   Septic shock - Secondary to Pneumonia & questionable bacteremia. Questionable Streptococcal Pneumonia - Completed Tressie Ellis 9/14 Streptococcal Viridans Bacteremia - Completed Tressie Ellis 9/14  Cdiff colonization  P:   Trending Leukocytosis Procalcitonin algorithm Re-cultured 9/17 Antibiotics restarted 9/17  Linezolid 9/17>> 9/19 Aztreonam 9/17>> Flagyl IV 9/17>>  Blood Ctx 9/17>> Urine Ctx 9/17>> Resp Ctx 9/17>>  Blood Ctx 9/13>> negative  R Pleural Effusion 9/13>>Negative  BAL 9/7>>Bacteria Neg/AFB  pending/Fungal pending  Blood Ctx 9/5>> 1/2 positive for Strep Viridans Urine Strep Ag 9/5>>negative Urine Leg Ag 9/5>>negative  ENDOCRINE A: Hyperglycemia  P: Monitor BG & RN to notify for <90 or >160  NEUROLOGY A: Chest Pain post resuscitation H/O Chronic pain Depression  P: RASS goal: 0 to -1 Propofol gtt Fentanyl IV prn   TODAYS SUMMARY:  64 year old female with atrial fibrillation and pneumonia with repeat intubation due to hypoxic respiratory failure. Patient has had failure to wean likely owing to pulmonary edema from hypoalbuminemic state. Patient suffered bradycardic arrest on 9/17 after episode of emesis. Patient had no evidence of bowel obstruction or residual tube feeds prior to this event. With elevation of Procalcitonin and no obvious source for shock otherwise in the face of leukocytosis broad-spectrum antibiotics were restarted after repeating cultures. Hgb drop overnight uncertain etiology, consider RP bleed given flank pain.  1 unit PRBC's & repeat CBC pending. Clinical prognosis remains guarded given multiple medical problems. Family very involved and would like to have group discussion.     Plan for NP to meet with family afternoon of 9/19.   Noe Gens, NP-C [facility] Pgr: [phone] or if no answer [phone] 01/05/2015, 9:47 AM   Attending:  I have seen and examined the patient with nurse practitioner/resident and agree with the note above.   This is a complex lady has been hospitalized for several weeks now. I reviewed the records from her outpatient oncologist and my partner who sees her in the pulmonary office. Also reviewed the records from her hospitalization here she was initially hospitalized for pneumococcal pneumonia and unfortunately was reintubated in the setting of volume overload. Over the weekend it sounds as if she had an aspiration event leading to a bradycardic arrest. She is now awake and alert and interactive on  the vent. It seems as if her renal function is worsening.  On exam Awake alert, following commands, complaining of right flank pain and fullness Lungs with audible wheeze, poor air movement, air trapping noted on vent Cardiovascular exam within normal limits Dermatologic cyanosis noted of toes and fingers Neurologic as above  Chest x-ray images from today personally reviewed showing atelectasis pulmonary edema and bilateral effusions endotracheal tube is in place, PICC line in place  Acute respiratory failure with hypoxemia: complex issue:  Primarly due to HCAP/Aspiration pneumonia, but now wheezing today with air trapping, baseline COPD> add solumedrol again overnight, change to brovana, wean back RR on vent for comfort; narrow antibiotics, stop Linezolid Hemoptysis> this has stopped; doubt DAH though review of her prior CT images shows non-specific changes which could have been associated with this; I think that its more likely that she has airway bleeding as she reports pain with sucitioning and is receiving mucomyst. Plan to stop mucomyst, d/c CT chest Anemia> big drop overnight, given flank pain I'm concerned about an RP bleed; will transfuse and follow serial CBC; hold off on CT abdomen unless H/H continues to drop  Plan family conversation today.  No single diagnosis here is terminal, but the combination of her acute medical problems on top of all of her chronic issues make the likelihood of a good outcome less likely. I think she could still get out of the ICU but it's going to take significant time in a prolonged hospitalization with rehabilitation. Family has been asking questions about utility, today will plan to inform them of her current state and then try to get a sense of her wishes.  My cc time 45 minutes  Roselie Awkward, MD [facility] Pager: [phone] Cell: [phone] After 3pm or if no response, call [phone]

## 2015-01-05 NOTE — Progress Notes (Signed)
Date:  Sept. 19, 2016 U.R. performed for needs and level of care. Will continue to follow for Case Management needs.  Rhonda Davis, RN, BSN, CCM   [phone] 

## 2015-01-05 NOTE — Progress Notes (Signed)
At 2000, pt noted to be asynchronous w/ vent w/ increased PEEP and peak pressures and decreased breath sounds on R side. RT at bedside. Over the course of the next 10-20 minutes, pt became unresponsive w/ VSS. ELINK called, new orders received. Pt continued to be asynchronous w/ vent until vent switched out by RT at which point pt had synchrony. Within 5 minutes of changing vent, pt woke up w/ return to baseline mental status. Breath sounds now clear and equal bilaterally, ELINK MD aware. RT and RN at bedside throughout episode. Will continue to closely monitor pt.   Dorrene German, RN

## 2015-01-05 NOTE — Progress Notes (Signed)
eLink Physician-Brief Progress Note Patient Name: [name] DOB: 14-Sep-1950 MRN: [id number]   Date of Service  01/05/2015  HPI/Events of Note  ABG on 40%/PRVC 14/TV 500/P 5 = 6.992/85.4/89.4  eICU Interventions  Will order: 1. Increase rate to 28.  2. ABG in 1 hour.      Intervention Category Major Interventions: Respiratory failure - evaluation and management;Acid-Base disturbance - evaluation and management  Sommer,Steven Eugene 01/05/2015, 9:00 PM

## 2015-01-05 NOTE — Progress Notes (Signed)
eLink Physician-Brief Progress Note Patient Name: [name] DOB: 1950/09/23 MRN: [id number]   Date of Service  01/05/2015  HPI/Events of Note  Lab alert  1. T.  Recent Labs Lab 01/03/15 1502 01/03/15 2100 01/04/15 0500 01/04/15 0810 01/05/15 0434  HGB 6.1* 8.2* 6.9* 8.1* 5.9*   2. eRN reporting that floor RN suctioning pink returns from ET tube  3. Increasing intermittent hypxoemai  4. Worseniung CXR  eICU Interventions  ? Alveolar hge  Plan 1 unit prbc CT chest wo contrast     Intervention Category Major Interventions: Other:  RAMASWAMY,MURALI 01/05/2015, 5:08 AM

## 2015-01-05 NOTE — Progress Notes (Addendum)
Met with family and patient to discuss goals of care.  Sister, two daughters, son and daughter-in-law (pediatric RN at an office) present for conversation.  Patient present / alert for conversation.    Discussed patients multi-system medical issues to include respiratory failure, recent pneumococcal PNA, aspiration event with bradycardic arrest, atrial fibrillation, protein calorie malnutrition, acute renal failure superimposed on chronic medical illness (CMML, anemia, malnutrition).  The patient indicates she desires all therapies that offer hope of improvement.  Discussed with patient that all of her current ICU issues are hopefully reversible.  She is willing to accept tracheostomy, ventilator, and hemodialysis if it will give her hope of return to prior state of health.  She is also willing to accept CPR if needed in the event of recurrent arrest.  Informed her that while all of her issues are hopefully reversible, they may also prove in combination to be something that she might not survive.  Patient and family indicate understanding.    Plan: Full Code Consider Nephrology Consult in AM Dr. Alvy Bimler called for input on CMML as family concerned she has not been receiving therapy and this may contribute to a clinical worsening.  Discussed tracheostomy timing - will review again with family on 9/21.  Suspect she will need a trach to allow time for recovery if possible.    Additional time with patient and family - 14 minutes   Kari Gens, NP-C [facility] Pgr: [phone] or if no answer [phone] 01/05/2015, 3:05 PM

## 2015-01-05 NOTE — Progress Notes (Signed)
eLink Physician-Brief Progress Note Patient Name: Winfred Redel DOB: 08/16/1950 MRN: [id number]   Date of Service  01/05/2015  HPI/Events of Note  Decreased LOC and reported decreased BS on R side with associated increase in peak airway pressure.   eICU Interventions  Will order: 1. Portable CXR now. 2. ABG now.      Intervention Category Intermediate Interventions: Respiratory distress - evaluation and management  Sommer,Steven Eugene 01/05/2015, 8:21 PM

## 2015-01-05 NOTE — Progress Notes (Signed)
CRITICAL VALUE ALERT  Critical value received:  Hgb 5.9   Date of notification:  01/05/2015  Time of notification:  0450  Critical value read back:Yes.    Nurse who received alert:  Dorrene German, RN  MD notified (1st page): ELINK/Ramaswamy  Time of first page:  0455  MD notified (2nd page):  Time of second page:  Responding MD:  Chase Caller   Time MD responded:  [phone]

## 2015-01-05 NOTE — Progress Notes (Signed)
Pt refused CPT. Per pt she in to much pain family at bedside.

## 2015-01-05 NOTE — Progress Notes (Signed)
   01/05/15 1500  Clinical Encounter Type  Visited With Patient and family together;Health care provider  Visit Type Initial;Spiritual support;Critical Care  Referral From Chaplain  Spiritual Encounters  Spiritual Needs Prayer;Emotional  Stress Factors  Patient Stress Factors Health changes  Family Stress Factors Health changes;Major life changes  Advance Directives (For Healthcare)  Does patient have an advance directive? Yes  Type of Paramedic of [place];Living will (Copy in chart)  Bent met with family and pt following Critical care family meeting; pt and family indicated that they understand and are fully aware of decisions; Adv. Directive and POA copies put in physical chart; Spiritual & emotional support offered by [facility]; Pt and family would like [facility] to return and visit again. 3:27 PM Gwynn Burly

## 2015-01-05 NOTE — Progress Notes (Signed)
Pt refused CPT.  Pt states she is in too much pain.

## 2015-01-06 ENCOUNTER — Inpatient Hospital Stay (HOSPITAL_COMMUNITY): Payer: 59

## 2015-01-06 DIAGNOSIS — C9310 Chronic myelomonocytic leukemia not having achieved remission: Secondary | ICD-10-CM

## 2015-01-06 DIAGNOSIS — D630 Anemia in neoplastic disease: Secondary | ICD-10-CM

## 2015-01-06 DIAGNOSIS — D638 Anemia in other chronic diseases classified elsewhere: Secondary | ICD-10-CM

## 2015-01-06 LAB — RENAL FUNCTION PANEL
ALBUMIN: 1.6 g/dL — AB (ref 3.5–5.0)
ALBUMIN: 1.8 g/dL — AB (ref 3.5–5.0)
Anion gap: 13 (ref 5–15)
Anion gap: 14 (ref 5–15)
BUN: 80 mg/dL — AB (ref 6–20)
BUN: 84 mg/dL — AB (ref 6–20)
CO2: 18 mmol/L — ABNORMAL LOW (ref 22–32)
CO2: 19 mmol/L — ABNORMAL LOW (ref 22–32)
CREATININE: 2.87 mg/dL — AB (ref 0.44–1.00)
CREATININE: 2.99 mg/dL — AB (ref 0.44–1.00)
Calcium: 7.5 mg/dL — ABNORMAL LOW (ref 8.9–10.3)
Calcium: 7.8 mg/dL — ABNORMAL LOW (ref 8.9–10.3)
Chloride: 97 mmol/L — ABNORMAL LOW (ref 101–111)
Chloride: 98 mmol/L — ABNORMAL LOW (ref 101–111)
GFR calc Af Amer: 19 mL/min — ABNORMAL LOW (ref >60)
GFR calc Af Amer: 18 mL/min — ABNORMAL LOW (ref >60)
GFR, EST NON AFRICAN AMERICAN: 16 mL/min — AB (ref >60)
GFR, EST NON AFRICAN AMERICAN: 15 mL/min — AB (ref >60)
GLUCOSE: 126 mg/dL — AB (ref 65–99)
Glucose, Bld: 250 mg/dL — ABNORMAL HIGH (ref 65–99)
PHOSPHORUS: 8.5 mg/dL — AB (ref 2.5–4.6)
PHOSPHORUS: 9 mg/dL — AB (ref 2.5–4.6)
Potassium: 4.7 mmol/L (ref 3.5–5.1)
Potassium: 5.2 mmol/L — ABNORMAL HIGH (ref 3.5–5.1)
SODIUM: 128 mmol/L — AB (ref 135–145)
Sodium: 131 mmol/L — ABNORMAL LOW (ref 135–145)

## 2015-01-06 LAB — CREATININE, URINE, RANDOM: Creatinine, Urine: 57.91 mg/dL

## 2015-01-06 LAB — PREPARE RBC (CROSSMATCH)

## 2015-01-06 LAB — CBC WITH DIFFERENTIAL/PLATELET
BASOS PCT: 0 %
Basophils Absolute: 0 10*3/uL (ref 0.0–0.1)
EOS PCT: 0 %
Eosinophils Absolute: 0 10*3/uL (ref 0.0–0.7)
HEMATOCRIT: 22.4 % — AB (ref 36.0–46.0)
Hemoglobin: 7.7 g/dL — ABNORMAL LOW (ref 12.0–15.0)
LYMPHS ABS: 1 10*3/uL (ref 0.7–4.0)
Lymphocytes Relative: 14 %
MCH: 29.3 pg (ref 26.0–34.0)
MCHC: 34.4 g/dL (ref 30.0–36.0)
MCV: 85.2 fL (ref 78.0–100.0)
MONOS PCT: 15 %
Monocytes Absolute: 1.1 10*3/uL — ABNORMAL HIGH (ref 0.1–1.0)
NEUTROS ABS: 5.3 10*3/uL (ref 1.7–7.7)
Neutrophils Relative %: 71 %
Platelets: 195 10*3/uL (ref 150–400)
RBC: 2.63 MIL/uL — AB (ref 3.87–5.11)
RDW: 18.5 % — AB (ref 11.5–15.5)
WBC: 7.4 10*3/uL (ref 4.0–10.5)

## 2015-01-06 LAB — TYPE AND SCREEN
ABO/RH(D): A POS
ANTIBODY SCREEN: NEGATIVE
DONOR AG TYPE: NEGATIVE
Donor AG Type: NEGATIVE
Donor AG Type: NEGATIVE
Donor AG Type: NEGATIVE
UNIT DIVISION: 0
UNIT DIVISION: 0
UNIT DIVISION: 0
Unit division: 0

## 2015-01-06 LAB — GLUCOSE, CAPILLARY
GLUCOSE-CAPILLARY: 108 mg/dL — AB (ref 65–99)
GLUCOSE-CAPILLARY: 117 mg/dL — AB (ref 65–99)
GLUCOSE-CAPILLARY: 101 mg/dL — AB (ref 65–99)
GLUCOSE-CAPILLARY: 124 mg/dL — AB (ref 65–99)
Glucose-Capillary: 88 mg/dL (ref 65–99)
Glucose-Capillary: 38 mg/dL — CL (ref 65–99)
Glucose-Capillary: 53 mg/dL — ABNORMAL LOW (ref 65–99)
Glucose-Capillary: 98 mg/dL (ref 65–99)
Glucose-Capillary: 80 mg/dL (ref 65–99)

## 2015-01-06 LAB — URINALYSIS, ROUTINE W REFLEX MICROSCOPIC
GLUCOSE, UA: NEGATIVE mg/dL
HGB URINE DIPSTICK: NEGATIVE
KETONES UR: NEGATIVE mg/dL
Nitrite: NEGATIVE
PH: 8 (ref 5.0–8.0)
PROTEIN: 100 mg/dL — AB
Specific Gravity, Urine: 1.018 (ref 1.005–1.030)
Urobilinogen, UA: 0.2 mg/dL (ref 0.0–1.0)

## 2015-01-06 LAB — MAGNESIUM: MAGNESIUM: 2.1 mg/dL (ref 1.7–2.4)

## 2015-01-06 LAB — CBC
HCT: 17.9 % — ABNORMAL LOW (ref 36.0–46.0)
Hemoglobin: 6 g/dL — CL (ref 12.0–15.0)
MCH: 28.8 pg (ref 26.0–34.0)
MCHC: 33.5 g/dL (ref 30.0–36.0)
MCV: 86.1 fL (ref 78.0–100.0)
Platelets: 195 10*3/uL (ref 150–400)
RBC: 2.08 MIL/uL — ABNORMAL LOW (ref 3.87–5.11)
RDW: 19.7 % — AB (ref 11.5–15.5)
WBC: 5.9 10*3/uL (ref 4.0–10.5)

## 2015-01-06 LAB — PROTIME-INR
INR: 1.12 (ref 0.00–1.49)
PROTHROMBIN TIME: 14.6 s (ref 11.6–15.2)

## 2015-01-06 LAB — URINE MICROSCOPIC-ADD ON

## 2015-01-06 LAB — TRIGLYCERIDES: TRIGLYCERIDES: 156 mg/dL — AB (ref <150)

## 2015-01-06 LAB — HEMOGLOBIN AND HEMATOCRIT, BLOOD
HEMATOCRIT: 21.7 % — AB (ref 36.0–46.0)
Hemoglobin: 7.6 g/dL — ABNORMAL LOW (ref 12.0–15.0)

## 2015-01-06 LAB — SODIUM, URINE, RANDOM: Sodium, Ur: <10 mmol/L

## 2015-01-06 MED ORDER — PRISMASOL BGK 4/2.5 32-4-2.5 MEQ/L IV SOLN
INTRAVENOUS | Status: DC
  Administered 2015-01-06 – 2015-01-07 (×2): via INTRAVENOUS_CENTRAL
  Filled 2015-01-06 (×3): qty 5000

## 2015-01-06 MED ORDER — LIDOCAINE HCL (CARDIAC) 20 MG/ML IV SOLN
INTRAVENOUS | Status: AC
  Filled 2015-01-06: qty 5

## 2015-01-06 MED ORDER — PRISMASOL BGK 4/2.5 32-4-2.5 MEQ/L IV SOLN
INTRAVENOUS | Status: DC
  Administered 2015-01-06: 18:00:00 via INTRAVENOUS_CENTRAL
  Filled 2015-01-06 (×2): qty 5000

## 2015-01-06 MED ORDER — SODIUM CHLORIDE 0.9 % IV SOLN
Freq: Once | INTRAVENOUS | Status: AC
  Administered 2015-01-06: 06:00:00 via INTRAVENOUS

## 2015-01-06 MED ORDER — DEXTROSE 50 % IV SOLN
INTRAVENOUS | Status: AC
  Administered 2015-01-06: 50 mL
  Filled 2015-01-06: qty 50

## 2015-01-06 MED ORDER — ROCURONIUM BROMIDE 50 MG/5ML IV SOLN
INTRAVENOUS | Status: AC
  Filled 2015-01-06: qty 2

## 2015-01-06 MED ORDER — ETOMIDATE 2 MG/ML IV SOLN
INTRAVENOUS | Status: AC
Start: 2015-01-06 — End: 2015-01-07
  Filled 2015-01-06: qty 20

## 2015-01-06 MED ORDER — CHLORHEXIDINE GLUCONATE 0.12 % MT SOLN
OROMUCOSAL | Status: AC
  Administered 2015-01-06: 15 mL via OROMUCOSAL
  Filled 2015-01-06: qty 15

## 2015-01-06 MED ORDER — SODIUM CHLORIDE 0.9 % FOR CRRT
INTRAVENOUS_CENTRAL | Status: DC | PRN
  Filled 2015-01-06: qty 1000

## 2015-01-06 MED ORDER — ALTEPLASE 2 MG IJ SOLR
2.0000 mg | Freq: Once | INTRAMUSCULAR | Status: DC | PRN
  Filled 2015-01-06: qty 2

## 2015-01-06 MED ORDER — SUCCINYLCHOLINE CHLORIDE 20 MG/ML IJ SOLN
INTRAMUSCULAR | Status: AC
  Filled 2015-01-06: qty 1

## 2015-01-06 MED ORDER — HEPARIN SODIUM (PORCINE) 1000 UNIT/ML DIALYSIS
1000.0000 [IU] | INTRAMUSCULAR | Status: DC | PRN
  Administered 2015-01-06 – 2015-01-12 (×2): 2800 [IU] via INTRAVENOUS_CENTRAL
  Filled 2015-01-06: qty 3
  Filled 2015-01-06 (×2): qty 6
  Filled 2015-01-06: qty 3

## 2015-01-06 MED ORDER — DEXTROSE 10 % IV SOLN
INTRAVENOUS | Status: DC
  Administered 2015-01-06 – 2015-01-09 (×5): via INTRAVENOUS

## 2015-01-06 MED ORDER — PRISMASOL BGK 4/2.5 32-4-2.5 MEQ/L IV SOLN
INTRAVENOUS | Status: DC
  Administered 2015-01-06 – 2015-01-07 (×5): via INTRAVENOUS_CENTRAL
  Filled 2015-01-06 (×11): qty 5000

## 2015-01-06 MED ORDER — DEXTROSE 5 % IV SOLN
500.0000 mg | Freq: Three times a day (TID) | INTRAVENOUS | Status: DC
  Administered 2015-01-06 – 2015-01-07 (×3): 500 mg via INTRAVENOUS
  Filled 2015-01-06 (×5): qty 0.5

## 2015-01-06 NOTE — Progress Notes (Signed)
Pt continues to refuse CPT due to Pain she says she has.  Pt motions to her mid-abdomen area when asked where she hurts.  RN notified.

## 2015-01-06 NOTE — Progress Notes (Signed)
CPT on hold due to CT results ( interval hemorrhage) rn aware.

## 2015-01-06 NOTE — Progress Notes (Signed)
eLink Physician-Brief Progress Note Patient Name: Lindalou Soltis DOB: 03-18-1951 MRN: [id number]   Date of Service  01/06/2015  HPI/Events of Note  hypoglcemia  eICU Interventions  Start d10     Intervention Category Minor Interventions: Other:  RAMASWAMY,MURALI 01/06/2015, 4:18 AM

## 2015-01-06 NOTE — Care Management Note (Signed)
Case Management Note  Patient Details  Name: [name] MRN: [id number] Date of Birth: 09/28/1950  Subjective/Objective:                    Action/Plan:   Expected Discharge Date:   (UNKNOWN)               Expected Discharge Plan:  Home/Self Care  In-House Referral:  NA  Discharge planning Services  CM Consult  Post Acute Care Choice:  NA Choice offered to:  NA  DME Arranged:    DME Agency:     HH Arranged:    HH Agency:     Status of Service:  Completed, signed off  Medicare Important Message Given:    Date Medicare IM Given:    Medicare IM give by:    Date Additional Medicare IM Given:    Additional Medicare Important Message give by:     If discussed at [place] of Stay Meetings, dates discussed:  [id number]  Additional Comments:  Leeroy Cha, RN 01/06/2015, 10:41 AM

## 2015-01-06 NOTE — Consult Note (Signed)
Renal Service Consult Note [facility]  [name] 01/06/2015 Roney Jaffe D Requesting Physician:  Dr Elsworth Soho  Reason for Consult:  Acute renal failure HPI: The patient is a 64 y.o. year-old with complicated PMH , hx ischemic colitis requiring colectomy/ ileostomy in Jan 2014. She has had frequent admissions for dehydration/ electrolyte imbalance since then. Also hx of COPD/ tobacco use and recurrent PNA.  Also has CMML, recurrent anemia, DJD with L THA. Last admit had Cdif which was new, rx'd by ID.    She was admitted on 9/5 with SOB, pleuritic CP, fatigue and cold sweats. Prod cough w mucus, dehydration with inc'd ostomy output.  SHe was treated with IVF, IV abx, O2, bronchodilators. She required intubation. Then failed extubation due to CHF. Then was diuresed. Then had afib / RVR (old issue) and anemia, CT abd showed fluid collectino.  Put on pressors.  This past weekend had a cardiac arrest while on the vent, unclear cause. Now anemic with rising creat over the past week or so.  Deemed not to be a candidate for surgery due to complex abd surg history and frailty overall. We are asked to see patient for progressive AKI.   Baseline creat 0.7- 1.0  No UA on chart.  No hydro by abd CT's.   Chart review: 1-13 ischemic colitis/ diarrhea, HHD, HL, dehydration, CBP, afib/ RVR 3-13 afib, partial SBO, chronic ischemic colitis, hypotension, CBP 7-13 left THA 1-14 ischemic colitis, pelvic abscess d/t anastomotic microleak, postop SBO, pseudomonas PNA, vol overload, afib/ RVR, PC malnutrition, fascial dehiscence, debility, wound infection, CMML 9-14 SOB/ orthopnea , dx was acute resp faiulre due to diast HF. Rx IV lasix. Open abd wound, chronic hyponatremia, AKI mild 9/14 SOB/ cough/ CP > dx was afib /RVR, hypoNa, acute resp failure d/t HCAP/ edema, severe septic shock (resolved) 12-14 influenza with PNA, Anemia, CBP, afib, chronic ischemic colitis, hx THA, MML, open abd wounds 5-15  symptomatic anemia due to chronic disease and CMML / rec'd 2 u PRBC. Met acidosis due to high output ileostomy. N/V. AKI from diuresis, improved. Low Na improved w IVF. CMML.  6-15 vol depletion/ tachycardia/ hypoCa, anemia, Na 128. Low vit D level. Rx with vit D and IV Ca, Ca levels improved to 8.2 corrected. Na HCO3 for met acidosis.  8-15 anorexia/ nausea/ resp distress > pulm nodules , HCAP vs malignancy, COPD / tobacco. Improved with IV abx. UTI +MRSA. Per ID Zyvox 2 weeks and doxy 2 weeks for UTI. High output ileostomy, open abd wall wound w ileostomy in place. 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  9-15 nausea, abd pain, dizziness, hx recurrent hospitalization for dehydration and electrolyte changes due to high-output ostomy. +CDif infection. Got IVF, Dificid/ FLorastor for Cdif, no PPI. Pt allergic to vanc. Ca/ Mg repleted IV.    Past Medical History  Past Medical History  Diagnosis Date  . Hypertension     She has a past hx of essential  . 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 Inflammation of joint of knee     Since we last last saw her she developed problems with an acute which required surgical drainage by her orthopedist Dr. Durward Fortes.  Marland Kitchen MRSA (methicillin resistant Staphylococcus aureus)     Knee surgery drainage was positive for MRSA and she was treated with 3 weeks of doxycycline successfully.  . Diarrhea     Mild  .  Exogenous obesity   . GERD (gastroesophageal reflux disease)     2 hosp.- ischemic colitis - residual of Norovirus, 05/2011- sm. bowel obstruction  . Headache(784.0)     migraine headache on occas, less now than when she was younger   . Arthritis     L hip, back, neck   . History of blood transfusion sept 2013    04/2011- /w ischemic colitis , trouble with matching blood  sept 2013  . Anemia     will see hematology consult prior to surgery, recommended by Dr. Mare Ferrari  . Anemia 12/15/2010  . Ischemic  colitis 01/31/2012  . Atrial flutter     during hospitalization, 04/2011- related to anemia & illness/stress   . Pneumonia     04/2011- not hospitalized , pt. denies SOB, changes in chest, breathing  . 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 Dizziness     occasional  . B12 deficiency 02/27/2013  . 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 COPD (chronic obstructive pulmonary disease) 09/26/2013   Past Surgical History  Past Surgical History  Procedure Laterality Date  . Knee surgery      I&D- 2008, post laceration   . Colonoscopy  05/16/2011    Procedure: COLONOSCOPY;  Surgeon: Winfield Cunas., MD;  Location: [facility];  Service: Endoscopy;  Laterality: N/A;  . Small intestine surgery  1992, 1999  . Laparotomy and lysis of adhesions    . Total hip arthroplasty  10/25/2011    Procedure: TOTAL HIP ARTHROPLASTY;  Surgeon: Garald Balding, MD;  Location: [place];  Service: Orthopedics;  Laterality: Left;  . Appendectomy  1962  . Abdominal hysterectomy  1988  . Partial colectomy and colostomy  sept 2013    mucous fistula done  . Partial colectomy  05/17/2012    Procedure: PARTIAL COLECTOMY;  Surgeon: Odis Hollingshead, MD;  Location: [facility];  Service: General;  Laterality: N/A;  . Colostomy closure  05/17/2012    Procedure: COLOSTOMY CLOSURE;  Surgeon: Odis Hollingshead, MD;  Location: [facility];  Service: General;  Laterality: N/A;  . Laparotomy  05/17/2012    Procedure: EXPLORATORY LAPAROTOMY;  Surgeon: Odis Hollingshead, MD;  Location: [facility];  Service: General;;  . Lysis of adhesion  05/17/2012    Procedure: LYSIS OF ADHESION;  Surgeon: Odis Hollingshead, MD;  Location: [facility];  Service: General;;  . Esophageal biopsy  05/17/2012    Procedure: BIOPSY;  Surgeon: Odis Hollingshead, MD;  Location: [facility];  Service: General;;  omental biopsy  . Laparotomy  05/22/2012    Procedure: EXPLORATORY LAPAROTOMY;  Surgeon: Odis Hollingshead, MD;  Location: [facility];  Service:  General;  Laterality: N/A;  DRAINAGE  INTRA-ABDOMINAL ABSCESS/LYSIS OF ADHESIONSFOR SMALL BOWEL OBSTRUCTION/DIVERTING LOOP ILEOSTOMY  . Tee without cardioversion N/A 11/25/2013    Procedure: TRANSESOPHAGEAL ECHOCARDIOGRAM (TEE);  Surgeon: Dorothy Spark, MD;  Location: [facility];  Service: Cardiovascular;  Laterality: N/A;   Family History  Family History  Problem Relation Age of Onset  . Stroke Father   . Atrial fibrillation Father   . Hypertension Mother    Social History  reports that she quit smoking about 21 months ago. Her smoking use included Cigarettes. She has a 5 pack-year smoking history. She has never used smokeless tobacco. She reports that she does not drink alcohol or use illicit drugs. Allergies  Allergies  Allergen Reactions  . Vancomycin Hives and Rash  wheezing  . Ativan [Lorazepam] Other (See Comments)    "makes me crazy"  . Codeine Nausea And Vomiting  . Digoxin And Related Swelling  . Tetanus Toxoids Other (See Comments)    Serum reaction  . Penicillins Hives and Rash  . Xarelto [Rivaroxaban] Hives and Rash    (May not be allergic. Vancomycin was also being taken when reaction occurred.)   Home medications Prior to Admission medications   Medication Sig Start Date End Date Taking? Authorizing Provider  albuterol (PROVENTIL HFA;VENTOLIN HFA) 108 (90 BASE) MCG/ACT inhaler Inhale 1-2 puffs into the lungs every 6 (six) hours as needed for wheezing or shortness of breath. 08/08/14  Yes Deneise Lever, MD  albuterol (PROVENTIL) (2.5 MG/3ML) 0.083% nebulizer solution Take 3 mLs (2.5 mg total) by nebulization every 6 (six) hours as needed for wheezing or shortness of breath. 12/08/14  Yes Deneise Lever, MD  aspirin 325 MG EC tablet Take 325 mg by mouth every morning.    Yes Historical Provider, MD  bisoprolol (ZEBETA) 5 MG tablet TAKE 1 TABLET BY MOUTH ONCE DAILY BEFORE EATING YOUR BREAKFAST Patient taking differently: TAKE 5 MG BY MOUTH ONCE DAILY BEFORE  EATING YOUR BREAKFAST 11/11/14  Yes Darlin Coco, MD  buPROPion (WELLBUTRIN XL) 300 MG 24 hr tablet Take 1 tablet (300 mg total) by mouth daily before breakfast. 05/27/14  Yes Darlin Coco, MD  cefUROXime (CEFTIN) 250 MG tablet Take 1 tablet (250 mg total) by mouth 2 (two) times daily with a meal. 12/08/14  Yes Deneise Lever, MD  celecoxib (CELEBREX) 200 MG capsule Take 200 mg by mouth daily.  12/25/13  Yes Historical Provider, MD  cyclobenzaprine (FLEXERIL) 10 MG tablet Take 10 mg by mouth 3 (three) times daily as needed for muscle spasms.  07/12/13  Yes Historical Provider, MD  diltiazem (CARDIZEM CD) 120 MG 24 hr capsule Take 1 capsule (120 mg total) by mouth daily. 05/27/14  Yes Darlin Coco, MD  diphenoxylate-atropine (LOMOTIL) 2.5-0.025 MG per tablet Take 1 tablet by mouth daily as needed (for diarrhea).  12/29/13  Yes Historical Provider, MD  esomeprazole (NEXIUM) 40 MG capsule Take 40 mg by mouth daily at 12 noon.   Yes Historical Provider, MD  estradiol (ESTRACE) 2 MG tablet Take 2 mg by mouth daily before breakfast.    Yes Historical Provider, MD  furosemide (LASIX) 40 MG tablet Take 40 mg by mouth daily as needed (for fluid).    Yes Historical Provider, MD  HYDROcodone-acetaminophen (NORCO) 10-325 MG per tablet Take 1 tablet by mouth every 4 (four) hours as needed (for pain).  11/15/13  Yes Historical Provider, MD  loratadine-pseudoephedrine (CLARITIN-D 12-HOUR) 5-120 MG per tablet Take 1 tablet by mouth daily as needed for allergies.    Yes Historical Provider, MD  Nebulizers (COMPRESSOR NEBULIZER) MISC 1 Device by Does not apply route 4 (four) times daily as needed. 12/08/14  Yes Deneise Lever, MD  ondansetron (ZOFRAN) 4 MG tablet Take 4 mg by mouth every 8 (eight) hours as needed for nausea or vomiting.   Yes Historical Provider, MD  Respiratory Therapy Supplies (FLUTTER) DEVI Blow through 4 times per set, three times daily 12/08/14  Yes Deneise Lever, MD  Azelastine-Fluticasone  [facility]) 137-50 MCG/ACT SUSP Place 2 sprays into both nostrils at bedtime. Patient not taking: Reported on 12/20/2014 06/12/14   Deneise Lever, MD  potassium chloride 20 MEQ TBCR Take 20 mEq by mouth 3 (three) times daily. Patient not taking: Reported on  12/11/2014 12/22/13   Verlee Monte, MD   Liver Function Tests  Recent Labs Lab 12/30/14 1500  12/31/14 1830  01/03/15 1020 01/04/15 0500 01/05/15 0424 01/06/15 0420  AST  --   --  14*  --  30  --   --   --   ALT  --   --  9*  --  13*  --   --   --   ALKPHOS  --   --  117  --  163*  --   --   --   BILITOT  --   --  0.3  --  0.8  --   --   --   PROT 3.9*  --  4.1*  --  4.7*  --   --   --   ALBUMIN  --   < > 1.6*  < > 2.2* 1.8* 1.7* 1.6*  < > = values in this interval not displayed.  Recent Labs Lab 12/31/14 1830 01/03/15 1020  LIPASE 14* 27  AMYLASE  --  86   CBC  Recent Labs Lab 01/04/15 0500  01/05/15 0434 01/05/15 1810 01/06/15 0128 01/06/15 1027 01/06/15 1322  WBC 36.6*  < > 21.8* 15.4* 5.9 7.4  --   NEUTROABS 21.3*  --  13.5*  --   --  5.3  --   HGB 6.9*  < > 5.9* 7.0* 6.0* 7.7* 7.6*  HCT 20.4*  < > 17.4* 21.2* 17.9* 22.4* 21.7*  MCV 87.9  < > 88.3 86.2 86.1 85.2  --   PLT 274  < > 261 233 195 195  --   < > = values in this interval not displayed. Basic Metabolic Panel  Recent Labs Lab 01/01/15 0500 01/02/15 0432 01/03/15 0550 01/03/15 1020 01/03/15 1502 01/04/15 0500 01/05/15 0424 01/06/15 0420  NA 136 137 135  135 131* 133* 135 129* 128*  K 3.8 3.5 3.8  3.7 5.9* 4.2 4.5 4.5 4.7  CL 103 106 102  102 103 103 101 96* 97*  CO2 _0 13* 22 22 19* 18*  GLUCOSE 127* 131* 124*  125* 193* 129* 123* 123* 250*  BUN 47* 49* 53*  54* 51* 57* 61* 68* 80*  CREATININE 1.51* 1.51* 1.58*  1.56* 1.74* 1.73* 2.03* 2.54* 2.87*  CALCIUM 7.5* 7.6* 8.4*  8.3* 8.7* 7.8* 8.2* 8.0* 7.5*  PHOS 4.4 4.8* 5.7*  5.5* 7.2*  --  6.9* 7.2* 8.5*    Filed Vitals:   01/06/15 1215 01/06/15 1230 01/06/15 1245  01/06/15 1300  BP:  106/62  104/74  Pulse: 86 89 88 86  Temp:      TempSrc:      Resp: _1 Height:      Weight:      SpO2: 100% 99% 100% 100%   Exam Small framed WF on vent, alert and responds appropriately  No rash, cyanosis or gangrene Sclera anicteric, throat clear No JVD Chest bilat diffuse coarse BS and some exp wheezing RRR no MRG Abd soft ntnd large mid-abd scar healed over, RLQ ostomy w dark brown contents in bag GU foley in place , minimal cloudy urine Ext 2-3 + bilat LE edema, 1-2+ UE edema Neuro is responsive , nf   Assessment: 1 Acute kidney injury - suspect ATN due to shock/ cardiac arrest which happened 2-3 days ago. She has massive vol overload and diffuse pulm edema which could improve with RRT.  She wants aggressive measures  at this point. Suspect chances for renal recovery about 50% if she otherwise improves. Plan CRRT w max UF to clear lungs.  2 VDRF 3 Vol overload/ pulm edema - up 25 kg from admit 4 PNA original problem on admission 9/5  5 Hyponatremia, chronic issue 6 Malnutrition 7 Intra-abdominal bleed 8 Anemia 9 Hypotension - improved, off pressors as of today 10 Hx CMML 11 Hx colectomy/ ileostomy for ischemic bowel (2014)   Plan- CRRT to start later today after line placed  Kelly Splinter MD (pgr) [phone]    ([phone] 01/06/2015, 2:09 PM

## 2015-01-06 NOTE — Progress Notes (Signed)
Rt attempted ABG x2 was unsuccessful.Pt refused to be stuck again. RN aware and family at bedside.

## 2015-01-06 NOTE — Progress Notes (Signed)
Dr. Lake Bells notified by phone of pt's critical abdominal CT results.  Dorrene German, RN

## 2015-01-06 NOTE — Consult Note (Signed)
[place]  [phone]   Requesting Provider: [facility]  Consulting Provider: Heath Lark, MD  Primary Oncologist: Heath Lark, MD  Patient Care Team: Darlin Coco, MD as PCP - General (Cardiology) Laurence Spates, MD (Gastroenterology) Garald Balding, MD (Orthopedic Surgery) Jackolyn Confer, MD as Attending Physician (General Surgery) Heath Lark, MD as Consulting Physician (Hematology and Oncology)  HOSPITAL CONSULTATION  NOTE  Reason for Consultation: CMML, on Aranesp for anemia I have seen the patient, examined her and edited the notes as follows This patient had background history of CMML and vitamin B-12 deficiency. She also have recurrent hospitalization related to high output ileostomy and complication related to dehydration and renal failure. She also required numerous blood transfusions in the past. The patient was started on Darbopoetin injection on 01/31/2014. HPI: 64 year old woman with a history of CMML, on observation, admitted on 9/5 with ventilator dependent respirator failure, complicated by  transudative pleural effusion requiring thoracentesis, and a bradycardic arrest on 9/17 requiring ACLS among other improtant medical issues. Patient is very ill but alert to provide non verbal responses. She reports increased pink frothy sputum production by endotracheal tube, denies fever or chills. She denies chest pain. She reports abdominal pain. She does complain of nausea without vomiting. She reports increased edema.  Cultures are positive for pneumococcal pneumonia treated with IV antibiotics. To date she has received 1 unit of blood on 9/10 ,2 units on 9/16 and 1 unit on 9/19  for symptomatic anemia. A CT of the abdomen and pelvis this morning shows interval hemorrhage with large fluid collections within the right anterior / lateral abdominal wall, diffuse anasarca, moderate ascites, pulmonary consolidation within the bilateral  lower lobes with adjacent small effusions, mild splenomegaly Family requests input from Hematology regarding her leukemia, to determine if these findings are related to her oncological disease.      CMML (chronic myelomonocytic leukemia)   11/17/2011 Initial Diagnosis CMML (chronic myelomonocytic leukemia)    Past Medical History  Diagnosis Date  . Hypertension     She has a past hx of essential  . 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 Inflammation of joint of knee     Since we last last saw her she developed problems with an acute which required surgical drainage by her orthopedist Dr. Durward Fortes.  Marland Kitchen MRSA (methicillin resistant Staphylococcus aureus)     Knee surgery drainage was positive for MRSA and she was treated with 3 weeks of doxycycline successfully.  . Diarrhea     Mild  . Exogenous obesity   . GERD (gastroesophageal reflux disease)     2 hosp.- ischemic colitis - residual of Norovirus, 05/2011- sm. bowel obstruction  . Headache(784.0)     migraine headache on occas, less now than when she was younger   . Arthritis     L hip, back, neck   . History of blood transfusion sept 2013    04/2011- /w ischemic colitis , trouble with matching blood  sept 2013  . Anemia     will see hematology consult prior to surgery, recommended by Dr. Mare Ferrari  . Anemia 12/15/2010  . Ischemic colitis 01/31/2012  . Atrial flutter     during hospitalization, 04/2011- related to anemia & illness/stress   . Pneumonia     04/2011- not hospitalized , pt. denies SOB, changes in chest, breathing  .  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 Dizziness     occasional  . B12 deficiency 02/27/2013  . 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 COPD (chronic obstructive pulmonary disease) 09/26/2013     MEDICATIONS:  Scheduled Meds: . alteplase  2 mg Intracatheter Once  . antiseptic oral rinse  7 mL Mouth  Rinse QID  . arformoterol  15 mcg Nebulization BID  . aztreonam  500 mg Intravenous 3 times per day  . budesonide (PULMICORT) nebulizer solution  0.5 mg Nebulization BID  . chlorhexidine gluconate  15 mL Mouth Rinse BID  . methylPREDNISolone (SOLU-MEDROL) injection  60 mg Intravenous Q12H  . metronidazole  500 mg Intravenous 3 times per day  . nicotine  7 mg Transdermal Daily  . pantoprazole (PROTONIX) IV  40 mg Intravenous Q24H  . promethazine  6.25 mg Intravenous Once  . sodium chloride  10-40 mL Intracatheter Q12H   Continuous Infusions: . dextrose 50 mL/hr at 01/06/15 0614  . diltiazem (CARDIZEM) infusion Stopped (01/04/15 1800)  . norepinephrine (LEVOPHED) Adult infusion Stopped (01/05/15 2040)  . phenylephrine (NEO-SYNEPHRINE) Adult infusion Stopped (12/28/14 0846)  . propofol (DIPRIVAN) infusion Stopped (01/06/15 0800)   PRN Meds:.albuterol, albuterol, fentaNYL (SUBLIMAZE) injection, metoprolol, [DISCONTINUED] ondansetron **OR** ondansetron (ZOFRAN) IV, sodium chloride  ALLERGIES:  Allergies  Allergen Reactions  . Vancomycin Hives and Rash    wheezing  . Ativan [Lorazepam] Other (See Comments)    "makes me crazy"  . Codeine Nausea And Vomiting  . Digoxin And Related Swelling  . Tetanus Toxoids Other (See Comments)    Serum reaction  . Penicillins Hives and Rash  . Xarelto [Rivaroxaban] Hives and Rash    (May not be allergic. Vancomycin was also being taken when reaction occurred.)    Review of systems: Unable to obtain due to the patient being on a ventilator  Family History  Problem Relation Age of Onset  . Stroke Father   . Atrial fibrillation Father   . Hypertension Mother      Past Surgical History  Procedure Laterality Date  . Knee surgery      I&D- 2008, post laceration   . Colonoscopy  05/16/2011    Procedure: COLONOSCOPY;  Surgeon: Winfield Cunas., MD;  Location: [facility];  Service: Endoscopy;  Laterality: N/A;  . Small intestine surgery   1992, 1999  . Laparotomy and lysis of adhesions    . Total hip arthroplasty  10/25/2011    Procedure: TOTAL HIP ARTHROPLASTY;  Surgeon: Garald Balding, MD;  Location: [facility];  Service: Orthopedics;  Laterality: Left;  . Appendectomy  1962  . Abdominal hysterectomy  1988  . Partial colectomy and colostomy  sept 2013    mucous fistula done  . Partial colectomy  05/17/2012    Procedure: PARTIAL COLECTOMY;  Surgeon: Odis Hollingshead, MD;  Location: [facility];  Service: General;  Laterality: N/A;  . Colostomy closure  05/17/2012    Procedure: COLOSTOMY CLOSURE;  Surgeon: Odis Hollingshead, MD;  Location: [facility];  Service: General;  Laterality: N/A;  . Laparotomy  05/17/2012    Procedure: EXPLORATORY LAPAROTOMY;  Surgeon: Odis Hollingshead, MD;  Location: [facility];  Service: General;;  . Lysis of adhesion  05/17/2012    Procedure: LYSIS OF ADHESION;  Surgeon: Odis Hollingshead, MD;  Location: [facility];  Service: General;;  . Esophageal biopsy  05/17/2012    Procedure: BIOPSY;  Surgeon: Odis Hollingshead, MD;  Location: [facility];  Service: General;;  omental biopsy  . Laparotomy  05/22/2012    Procedure: EXPLORATORY LAPAROTOMY;  Surgeon: Odis Hollingshead, MD;  Location: [facility];  Service: General;  Laterality: N/A;  DRAINAGE  INTRA-ABDOMINAL ABSCESS/LYSIS OF ADHESIONSFOR SMALL BOWEL OBSTRUCTION/DIVERTING LOOP ILEOSTOMY  . Tee without cardioversion N/A 11/25/2013    Procedure: TRANSESOPHAGEAL ECHOCARDIOGRAM (TEE);  Surgeon: Dorothy Spark, MD;  Location: [facility];  Service: Cardiovascular;  Laterality: N/A;    Social History   Social History  . Marital Status: Single    Spouse Name: N/A  . Number of Children: 3  . Years of Education: N/A   Occupational History  .      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 Smoking status: Former Smoker -- 0.25 packs/day for 20 years    Types: Cigarettes    Quit date: 03/21/2013  . Smokeless tobacco: Never Used  . Alcohol  Use: No     Comment: 1 - 2 glasses of wine per week  . Drug Use: No  . Sexual Activity: Not Currently   Other Topics Concern  . Not on file   Social History Narrative     PHYSICAL EXAMINATION:  Filed Vitals:   01/06/15 0900  BP: 115/80  Pulse: 74  Temp:   Resp: 28      Intake/Output Summary (Last 24 hours) at 01/06/15 0958 Last data filed at 01/06/15 0900  Gross per 24 hour  Intake 1353.56 ml  Output    434 ml  Net 919.56 ml     GENERAL:alert, no distress and comfortable. She remained on the ventilator. Gross anasarca  SKIN:no rashes or significant lesions. Mottled digits and toes EYES: normal, conjunctiva are pink and non-injected, sclera clear OROPHARYNX:endotracheal tube in place LYMPH:  no palpable lymphadenopathy in the cervical, axillary or inguinal LUNGS: bilateral coarse breath sounds anteriorly HEART: regular rate & rhythm and no murmurs and no lower extremity edema ABDOMEN: soft, non-tender and normal bowel sounds. Right ostomy. Significant scarring Musculoskeletal:Note a peripheral cyanosis  PSYCH: alert  NEURO: no focal motor/sensory deficits  LABORATORY/RADIOLOGY DATA:   Recent Labs Lab 01/02/15 0432  01/03/15 0550 01/03/15 1020  01/04/15 0500 01/04/15 0810 01/05/15 0434 01/05/15 1810 01/06/15 0128  WBC 16.4*  < > 24.9* 45.5*  --  36.6* 30.2* 21.8* 15.4* 5.9  HGB 5.4*  < > 6.8* 7.7*  < > 6.9* 8.1* 5.9* 7.0* 6.0*  HCT 17.1*  < > 20.4* 24.7*  < > 20.4* 24.3* 17.4* 21.2* 17.9*  PLT 248  < > 260 157  --  274 260 261 233 195  MCV 103.0*  < > 91.9 98.4  --  87.9 87.4 88.3 86.2 86.1  MCH 32.5  < > 30.6 30.7  --  29.7 29.1 29.9 28.5 28.8  MCHC 31.6  < > 33.3 31.2  --  33.8 33.3 33.9 33.0 33.5  RDW 19.7*  < > 22.9* 24.2*  --  20.5* 20.4* 20.9* 19.8* 19.7*  LYMPHSABS 2.6  --  2.5 8.2*  --  2.9  --  3.7  --   --   MONOABS 4.9*  --  4.5* 5.9*  --  12.4*  --  4.6*  --   --   EOSABS 0.2  --  0.0 0.0  --  0.0  --  0.0  --   --   BASOSABS 0.0  --  0.0  0.0  --  0.0  --  0.0  --   --   < > = values in this interval not displayed.  CMP    Recent Labs Lab 12/31/14 1830  01/03/15 0550 01/03/15 1020 01/03/15 1502 01/04/15 0500 01/05/15 0424 01/05/15 0434 01/06/15 0420  NA 135  < > 135  135 131* 133* 135 129*  --  128*  K 3.9  < > 3.8  3.7 5.9* 4.2 4.5 4.5  --  4.7  CL 103  < > 102  102 103 103 101 96*  --  97*  CO2 23  < > 22  22 13* 22 22 19*  --  18*  GLUCOSE 125*  < > 124*  125* 193* 129* 123* 123*  --  250*  BUN 42*  < > 53*  54* 51* 57* 61* 68*  --  80*  CREATININE 1.37*  < > 1.58*  1.56* 1.74* 1.73* 2.03* 2.54*  --  2.87*  CALCIUM 7.0*  < > 8.4*  8.3* 8.7* 7.8* 8.2* 8.0*  --  7.5*  MG  --   < > 2.1 2.7*  --  2.2  --  2.1 2.1  AST 14*  --   --  30  --   --   --   --   --   ALT 9*  --   --  13*  --   --   --   --   --   ALKPHOS 117  --   --  163*  --   --   --   --   --   BILITOT 0.3  --   --  0.8  --   --   --   --   --   < > = values in this interval not displayed.      Component Value Date/Time   BILITOT 0.8 01/03/2015 1020   BILITOT 0.37 08/28/2013 1211   BILIDIR <0.2 11/19/2013 1000   IBILI NOT CALCULATED 11/19/2013 1000        Component Value Date/Time   ESRSEDRATE 75* 10/17/2011 1451     Recent Labs Lab 01/03/15 1020  INR 1.09    Liver Function Tests:  Recent Labs Lab 12/30/14 1500  12/31/14 1830  01/03/15 0550 01/03/15 1020 01/04/15 0500 01/05/15 0424 01/06/15 0420  AST  --   --  14*  --   --  30  --   --   --   ALT  --   --  9*  --   --  13*  --   --   --   ALKPHOS  --   --  117  --   --  163*  --   --   --   BILITOT  --   --  0.3  --   --  0.8  --   --   --   PROT 3.9*  --  4.1*  --   --  4.7*  --   --   --   ALBUMIN  --   < > 1.6*  < > 2.2* 2.2* 1.8* 1.7* 1.6*  < > = values in this interval not displayed.  Recent Labs Lab 12/31/14 1830 01/03/15 1020  LIPASE 14* 27  AMYLASE  --  86    CBG:  Recent Labs Lab 01/05/15 2307 01/06/15 0342 01/06/15 0405 01/06/15 0425  01/06/15 0808  GLUCAP 88 38* 53* 101* 98    Cardiac Enzymes:  Recent Labs Lab 01/03/15 1020 01/03/15  1615 01/03/15 2100  TROPONINI 0.04* 0.41* 0.33*     Radiology Studies: I have reviewed the imaging study myself   Ct Abdomen Pelvis Wo Contrast  01/06/2015      FINDINGS: Lower chest: Normal heart size. Small to moderate bilateral pleural effusions with consolidation in the visualized bilateral lower lobes. Additional scattered ground-glass opacities within the visualized lungs bilaterally.  Hepatobiliary: Liver is normal in size and contour. Gallbladder is unremarkable.  Pancreas: Unremarkable  Spleen: Enlarged measuring 14 cm.  Adrenals/Urinary Tract: Unremarkable adrenal glands. Kidneys are symmetric in size. No hydronephrosis. Urinary bladder is decompressed with a Foley catheter.  Stomach/Bowel: Enteric tube terminates within the stomach. No abnormal bowel wall thickening or evidence for bowel obstruction. Anastomotic sutures are demonstrated within the distal colon as well as within the right upper quadrant. There is a moderate amount of free fluid within the abdomen, the majority of which is located within the pelvis. Right lower quadrant colostomy.  Vascular/Lymphatic: Normal caliber abdominal aorta. Multiple prominent retroperitoneal lymph nodes, grossly stable. Peripheral calcified atherosclerotic plaque.  Other: Diffuse anasarca. Multiple fluid collections are demonstrated along the right anterior and lateral abdominal wall measuring up to 13.7 cm. In the interval there has been development of high attenuation within these fluid collections, compatible with interval hemorrhage. Anterior abdominal wall defect with fascia covering the underlying peritoneal cavity.  Musculoskeletal: Left hip arthroplasty. Degenerative changes lower lumbar spine. No aggressive or acute appearing osseous lesions.  IMPRESSION: Interval hemorrhage into previously described large fluid collections within the  right anterior and lateral abdominal wall.  Diffuse anasarca.  Moderate ascites.  Pulmonary consolidation within the bilateral lower lobes with adjacent small pleural effusions. Findings may represent atelectasis, infection or edema.  Mild splenomegaly.  Critical Value/emergent results were called by telephone at the time of interpretation on 01/06/2015 at 7:33 am to Nurse Leandra Kern, RN, who verbally acknowledged these results.   Electronically Signed   By: Lovey Newcomer M.D.   On: 01/06/2015 07:41   Ct Abdomen Pelvis Wo Contrast  01/02/2015   COMPARISON:  Abdomen radiographs, 12/31/2014.  CT, 05/22/2012  FINDINGS: Lung bases: There are small pleural effusions. There is consolidation of the visible lower lobes and the dependent portion of the right middle lobe. There are irregular hazy and reticular peribronchovascular opacities in the aerated portions of the lung bases.  Liver: Unremarkable  Spleen: Enlarged measuring 15.6 cm in greatest dimension. No splenic mass or focal lesion.  Gallbladder: Not well visualized due to adjacent fluid-filled bowel. Possible gallstone. No convincing acute cholecystitis. No bile duct dilation.  Pancreas:  Unremarkable.  Adrenal glands:  No masses.  Kidneys, ureters, bladder: Kidneys in ureters are unremarkable. Bladder not well evaluated being only mildly distended and partly obscured by left hip prosthesis artifact. No gross bladder abnormality. Foley catheter projects within the bladder.  Lymph nodes: There are prominent and mildly enlarged portal caval lymph nodes, similar to the prior exam.  Ascites: Small to moderate amount ascites is seen adjacent to the liver and spleen, along the pericolic gutters and between the use of small bowel mesentery and collecting in the pelvis. Diffuse edema is noted throughout the mesenteric.  Gastrointestinal: Bowel anastomosis staple line lies in the central pelvis forming a Hartman's pouch of the remaining lower sigmoid colon. Right colon  has been diverted into a right mid abdomen colostomy. There is an ileocolic anastomosis in the right mid to lower quadrant. Small bowel is normal in caliber. There is no evidence of obstruction.  Abdominal wall: There is a defect along the anterior abdominal wall. Only the fashion appears to cover the underlying peritoneal cavity. This is new from the prior exam. Loculated fluid collections are noted throughout the right lateral abdominal wall extending to the level of the right iliac crest. Largest collection lies just above the right iliac crest measuring 12 x 7.4 x 12 cm in size. It is likely contiguous with the collections extend over the right antral lateral abdominal wall all the way superior to the level of the mid liver. These collections are new the prior CT.  There is diffuse subcutaneous edema.  Musculoskeletal: Left hip prosthesis, incompletely imaged but appears be well aligned. There is some subchondral cystic change along the right femoral head. Degenerative changes are noted throughout the visualized spine including a grade 1 anterolisthesis of L4 on L5. There is a moderate lumbar levoscoliosis. Bones are diffusely demineralized.  IMPRESSION: 1. There multiple abnormalities. 2. Fluid collections are noted along the right anterior and lateral abdominal wall extending from the anterior right upper quadrant to the right lateral lower abdomen above the right iliac crest. These could be infected collections. They could reflect seromas from prior hemorrhage given the history of anemia. Hounsfield unit measurement of these collections show them to be water density. 3. Anasarca with a small to moderate amount of ascites. Small pleural effusions. Diffuse soft tissue edema. 4. Atelectasis of the visible lower lobes. Partial atelectasis of the right middle lobe. Abnormal densities in the aerated portions of the visible lung bases each could reflect infection or inflammatory change or possibly interstitial  edema. 5. Mild splenomegaly of with the spleen increased in size from the prior CT. 6. Possible gallstone.   Electronically Signed   By: Lajean Manes M.D.   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 Last dose was given on 11/27/14 During this admission she has received a total of 4 units of blood in the setting of infection,cardiac issues, dilution, acute illness.  CT abdomen shows interval hemorrhage with large fluid collections within the right anterior / lateral abdominal wall, diffuse anasarca, moderate ascites, pulmonary consolidation within the bilateral lower lobes with adjacent small effusions, mild splenomegaly, which may be contributing to her anemia Continue to transfuse as needed to maintain Hb greater than 7 The Aranesp will not work in a setting of severe infection and severe illness. I would cancel her outpatient appointment this week. Future plan of discharge is indeterminate. I will keep the rest of her appointment in the outpatient as is. The family can call in the future once she is discharged for future outpatient follow-up.  B12 deficiency Her last serum B-12 level was adequate.  We will continue close monitoring.  She takes oral B-12 supplement  Acute Hypoxic Respiratory Failure - Multifactorial from Pneumonia,  Volume Overload & acute aspiration event (9/17) Bradycardic Arrest 9/17 - Likely due to aspiration. A-Fib with RVR- converted to NSR. Currently ST.  Acute Renal Failure - worsening with anuria 9/20. Hypervolemia - due to hypoalbuminemia Shock - Likely ongoing due to sedation, off pressors on 9/20 Septic shock - Secondary to Pneumonia & questionable  bacteremia. Questionable Streptococcal Pneumonia - Completed Tressie Ellis 9/14 Streptococcal  Viridans Bacteremia - Completed Tressie Ellis 9/14  Cdiff colonization Appreciate CCM involvement in this complex case  Full Code   Other medical issues as per admitting team The patient and family are aware of the plan of care. I will sign off. This call if questions arise   Rondel Jumbo, PA-C 01/06/2015, 9:58 AM GORSUCH, NI, MD 01/06/2015

## 2015-01-06 NOTE — Procedures (Signed)
Central Venous Catheter Insertion Procedure Note [name] [id number] 06-Sep-1950  Procedure: Insertion of Central Venous Catheter Indications: hd  Procedure Details Consent: Risks of procedure as well as the alternatives and risks of each were explained to the (patient/caregiver).  Consent for procedure obtained. Time Out: Verified patient identification, verified procedure, site/side was marked, verified correct patient position, special equipment/implants available, medications/allergies/relevent history reviewed, required imaging and test results available.  Performed  Maximum sterile technique was used including antiseptics, cap, gloves, gown, hand hygiene, mask and sheet. Skin prep: Chlorhexidine; local anesthetic administered A antimicrobial bonded/coated triple lumen catheter was placed in the left internal jugular vein using the Seldinger technique.  Evaluation Blood flow good Complications: No apparent complications Patient did tolerate procedure well. Chest X-ray ordered to verify placement.  CXR: pending.  Raylene Miyamoto 01/06/2015, 5:40 PM  [place]  ,Lavon Paganini. Titus Mould, MD, [place] Pgr: [facility]

## 2015-01-06 NOTE — Progress Notes (Signed)
Nutrition Follow-up  INTERVENTION:   When medically able, recommend resuming tube feedings: Initiate Vital HP @ 25, increase by 10 ml every 4 hours to goal rate of 50 ml/hr.  Tube feeding regimen + current Propofol infusion provides 1316 kcal (94% of needs), 105 grams of protein, and 1003 ml of H2O.   RD to continue to monitor  NUTRITION DIAGNOSIS:   Inadequate oral intake related to inability to eat as evidenced by NPO status.  Ongoing.  GOAL:   Patient will meet greater than or equal to 90% of their needs  Not meeting.  MONITOR:   Vent status, TF tolerance, Weight trends, Labs, I & O's  REASON FOR ASSESSMENT:   Consult Assessment of nutrition requirement/status  ASSESSMENT:   64 y.o. female with a past medical history of COPD, cigarette smoker, history of multiple pneumonias, history of C. difficile, history of partial colectomy and ileostomy, paroxysmal atrial fibrillation hypertension, chronic myelocytic monoclonal leukemia, chronic anemia that requires Epogen injections and occasional blood transfusions, MRSA carrier, GERD who comes to the emergency department due to worsening shortness of breath, pleuritic chest pain, fatigue, cold sweats for several days. She has also been having occasionally productive cough of mucus, decreased appetite, difficulty sleeping and dehydration due to high ileostomy output with decreased oral intake.   TF stopped d/t emesis. Recommend restarting as soon as medically able.  Patient is currently intubated on ventilator support MV: 12.8 L/min Temp (24hrs), Avg:97.8 F (36.6 C), Min:97.5 F (36.4 C), Max:98.4 F (36.9 C)  Propofol: 4.4 ml/hr -Provides 116 fat kcal.  Labs reviewed: Low Na Elevated BUN & Creatinine, Phos Mg WNL  Diet Order:  Diet NPO time specified  Skin:  Wound (see comment) (abdominal incision)  Last BM:  9/20  Height:   Ht Readings from Last 1 Encounters:  12/30/14 5\' 5"  (1.651 m)    Weight:   Wt  Readings from Last 1 Encounters:  01/06/15 185 lb 13.6 oz (84.3 kg)    Ideal Body Weight:  56.82 kg (kg)  BMI:  Body mass index is 30.93 kg/(m^2).  Estimated Nutritional Needs:   Kcal:  1402  Protein:  90-100g  Fluid:  1.5L/day  EDUCATION NEEDS:   No education needs identified at this time  Clayton Bibles, MS, RD, LDN Pager: [phone] After Hours Pager: [phone]

## 2015-01-06 NOTE — Progress Notes (Addendum)
CPT on hold due to CT results showing abdominal hemorrhage.  RN is aware.

## 2015-01-06 NOTE — Progress Notes (Signed)
Hypoglycemic Event  CBG: 38  Treatment: D50 IV 50 mL  Symptoms: Pale  Follow-up CBG: Time:0403 CBG Result:53  Possible Reasons for Event: Unknown  Comments/MD notified: Meredeth Ide  Remember to initiate Hypoglycemia Order Set & complete

## 2015-01-06 NOTE — Progress Notes (Signed)
ANTIBIOTIC CONSULT NOTE - FOLLOW UP  Pharmacy Consult for Aztreonam Indication: pneumonia  Allergies  Allergen Reactions  . Vancomycin Hives and Rash    wheezing  . Ativan [Lorazepam] Other (See Comments)    "makes me crazy"  . Codeine Nausea And Vomiting  . Digoxin And Related Swelling  . Tetanus Toxoids Other (See Comments)    Serum reaction  . Penicillins Hives and Rash  . Xarelto [Rivaroxaban] Hives and Rash    (May not be allergic. Vancomycin was also being taken when reaction occurred.)    Patient Measurements: Height: 5\' 5"  (165.1 cm) Weight: 185 lb 13.6 oz (84.3 kg) IBW/kg (Calculated) : 57  Vital Signs: Temp: 97.7 F (36.5 C) (09/20 0643) Temp Source: Oral (09/20 0643) BP: 112/75 mmHg (09/20 0643) Pulse Rate: 89 (09/20 0643) Intake/Output from previous day: 09/19 0701 - 09/20 0700 In: 1819.2 [I.V.:469.2; POEUM:353; NG/GT:30; IV Piggyback:655] Out: 235 [Urine:95; Emesis/NG output:100; Stool:40] Intake/Output from this shift:    Labs:  Recent Labs  01/04/15 0500  01/05/15 0424 01/05/15 0434 01/05/15 1810 01/06/15 0128 01/06/15 0420  WBC 36.6*  < >  --  21.8* 15.4* 5.9  --   HGB 6.9*  < >  --  5.9* 7.0* 6.0*  --   PLT 274  < >  --  261 233 195  --   CREATININE 2.03*  --  2.54*  --   --   --  2.87*  < > = values in this interval not displayed. Estimated Creatinine Clearance: 20.9 mL/min (by C-G formula based on Cr of 2.87). No results for input(s): VANCOTROUGH, VANCOPEAK, VANCORANDOM, GENTTROUGH, GENTPEAK, GENTRANDOM, TOBRATROUGH, TOBRAPEAK, TOBRARND, AMIKACINPEAK, AMIKACINTROU, AMIKACIN in the last 72 hours.   Microbiology: Recent Results (from the past 720 hour(s))  TECHNOLOGIST REVIEW     Status: None   Collection Time: 12/11/14 10:49 AM  Result Value Ref Range Status   Technologist Review   Final    Metas and Myelocytes, 1% nrbc, mod teardrops and ovalocytes, few shistocytes present  Blood culture (routine x 2)     Status: None   Collection  Time: 12/20/2014  6:32 PM  Result Value Ref Range Status   Specimen Description BLOOD LEFT ANTECUBITAL  Final   Special Requests BOTTLES DRAWN AEROBIC AND ANAEROBIC 5CC  Final   Culture  Setup Time   Final    GRAM VARIABLE COCCOBACILLI AEROBIC BOTTLE ONLY CRITICAL RESULT CALLED TO, READ BACK BY AND VERIFIED WITH: D ALDRIDGE,RN AT 1220 12/24/14 BY L BENFIELD    Culture   Final    VIRIDANS STREPTOCOCCUS THE SIGNIFICANCE OF ISOLATING THIS ORGANISM FROM A SINGLE SET OF BLOOD CULTURES WHEN MULTIPLE SETS ARE DRAWN IS UNCERTAIN. PLEASE NOTIFY THE MICROBIOLOGY DEPARTMENT WITHIN ONE WEEK IF SPECIATION AND SENSITIVITIES ARE REQUIRED. Performed at [facility]    Report Status 12/26/2014 FINAL  Final  Blood culture (routine x 2)     Status: None   Collection Time: 01/06/2015  8:55 PM  Result Value Ref Range Status   Specimen Description BLOOD LEFT HAND  Final   Special Requests BOTTLES DRAWN AEROBIC ONLY 5CC  Final   Culture   Final    NO GROWTH 5 DAYS Performed at [facility]    Report Status 12/28/2014 FINAL  Final  C difficile quick scan w PCR reflex     Status: Abnormal   Collection Time: 01/15/2015  9:08 PM  Result Value Ref Range Status   C Diff antigen POSITIVE (A) NEGATIVE Final  C Diff toxin NEGATIVE NEGATIVE Final   C Diff interpretation   Final    C. difficile present, but toxin not detected. This indicates colonization. In most cases, this does not require treatment. If patient has signs and symptoms consistent with colitis, consider treatment.    Comment: RESULT CALLED TO, READ BACK BY AND VERIFIED WITH: Q MBEMENA RN [id number] 12/30/2014 A NAVARRO   MRSA PCR Screening     Status: Abnormal   Collection Time: 12/31/2014  9:46 PM  Result Value Ref Range Status   MRSA by PCR POSITIVE (A) NEGATIVE Final    Comment:        The GeneXpert MRSA Assay (FDA approved for NASAL specimens only), is one component of a comprehensive MRSA colonization surveillance program. It is  not intended to diagnose MRSA infection nor to guide or monitor treatment for MRSA infections. RESULT CALLED TO, READ BACK BY AND VERIFIED WITH: Q MBEMENA RN [id number] 12/23/14 A NAVARRO   Culture, bal-quantitative     Status: None   Collection Time: 12/24/14 10:47 AM  Result Value Ref Range Status   Specimen Description BRONCHIAL ALVEOLAR LAVAGE  Final   Special Requests Immunocompromised  Final   Gram Stain   Final    RARE WBC PRESENT, PREDOMINANTLY MONONUCLEAR RARE SQUAMOUS EPITHELIAL CELLS PRESENT NO ORGANISMS SEEN Performed at [place] NO GROWTH Performed at [facility]   Final   Culture   Final    NO GROWTH 2 DAYS Performed at [facility]    Report Status 12/26/2014 FINAL  Final  Fungus Culture with Smear     Status: None (Preliminary result)   Collection Time: 12/24/14 10:47 AM  Result Value Ref Range Status   Specimen Description BRONCHIAL ALVEOLAR LAVAGE  Final   Special Requests Immunocompromised  Final   Fungal Smear   Final    NO YEAST OR FUNGAL ELEMENTS SEEN Performed at [facility]    Culture   Final    CANDIDA ALBICANS Performed at [facility]    Report Status PENDING  Incomplete  AFB culture with smear     Status: None (Preliminary result)   Collection Time: 12/24/14 10:47 AM  Result Value Ref Range Status   Specimen Description BRONCHIAL ALVEOLAR LAVAGE  Final   Special Requests Immunocompromised  Final   Acid Fast Smear   Final    NO ACID FAST BACILLI SEEN Performed at [facility]    Culture   Final    CULTURE WILL BE EXAMINED FOR 6 WEEKS BEFORE ISSUING A FINAL REPORT Performed at [facility]    Report Status PENDING  Incomplete  Body fluid culture     Status: None   Collection Time: 12/30/14 12:34 PM  Result Value Ref Range Status   Specimen Description PLEURAL  Final   Special Requests Normal  Final   Gram Stain   Final    MODERATE WBC PRESENT, PREDOMINANTLY  MONONUCLEAR NO ORGANISMS SEEN    Culture   Final    NO GROWTH 3 DAYS Performed at [facility]    Report Status 01/02/2015 FINAL  Final  Culture, blood (routine x 2)     Status: None   Collection Time: 12/30/14  5:15 PM  Result Value Ref Range Status   Specimen Description BLOOD LEFT HAND  Final   Special Requests IN PEDIATRIC BOTTLE  [place]  Final   Culture   Final    NO  GROWTH 5 DAYS Performed at [facility]    Report Status 01/04/2015 FINAL  Final  Culture, blood (routine x 2)     Status: None   Collection Time: 12/30/14  5:30 PM  Result Value Ref Range Status   Specimen Description BLOOD LEFT HAND  Final   Special Requests IN PEDIATRIC BOTTLE  .5CC  Final   Culture   Final    NO GROWTH 5 DAYS Performed at [facility]    Report Status 01/04/2015 FINAL  Final  Culture, blood (routine x 2)     Status: None (Preliminary result)   Collection Time: 01/03/15 11:20 PM  Result Value Ref Range Status   Specimen Description BLOOD LEFT HAND  Final   Special Requests IN PEDIATRIC BOTTLE  2.5 CC  Final   Culture   Final    NO GROWTH 1 DAY Performed at [facility]    Report Status PENDING  Incomplete  Culture, blood (routine x 2)     Status: None (Preliminary result)   Collection Time: 01/03/15 11:25 PM  Result Value Ref Range Status   Specimen Description BLOOD LEFT WRIST  Final   Special Requests IN PEDIATRIC BOTTLE 2.5 CC  Final   Culture   Final    NO GROWTH 1 DAY Performed at [facility]    Report Status PENDING  Incomplete  Culture, Urine     Status: None (Preliminary result)   Collection Time: 01/03/15 11:29 PM  Result Value Ref Range Status   Specimen Description URINE, CATHETERIZED  Final   Special Requests Normal  Final   Culture   Final    CULTURE REINCUBATED FOR BETTER GROWTH Performed at [facility]    Report Status PENDING  Incomplete  Culture, respiratory (NON-Expectorated)     Status: None (Preliminary result)    Collection Time: 01/04/15 12:36 AM  Result Value Ref Range Status   Specimen Description TRACHEAL ASPIRATE  Final   Special Requests Normal  Final   Gram Stain   Final    FEW WBC PRESENT, PREDOMINANTLY PMN RARE SQUAMOUS EPITHELIAL CELLS PRESENT ABUNDANT GRAM NEGATIVE COCCOBACILLI MODERATE GRAM POSITIVE COCCI IN PAIRS AND CHAINS FEW YEAST    Culture   Final    ABUNDANT GRAM NEGATIVE RODS Performed at [facility]    Report Status PENDING  Incomplete    Anti-infectives    Start     Dose/Rate Route Frequency Ordered Stop   01/04/15 0600  aztreonam (AZACTAM) 1 g in dextrose 5 % 50 mL IVPB     1 g 100 mL/hr over 30 Minutes Intravenous 3 times per day 01/03/15 2255     01/03/15 2300  linezolid (ZYVOX) IVPB 600 mg  Status:  Discontinued     600 mg 300 mL/hr over 60 Minutes Intravenous Every 12 hours 01/03/15 2248 01/05/15 1039   01/03/15 2300  metroNIDAZOLE (FLAGYL) IVPB 500 mg     500 mg 100 mL/hr over 60 Minutes Intravenous 3 times per day 01/03/15 2248     01/03/15 2300  aztreonam (AZACTAM) 2 g in dextrose 5 % 50 mL IVPB     2 g 100 mL/hr over 30 Minutes Intravenous  Once 01/03/15 2254 01/04/15 0003   12/24/14 1800  tobramycin (NEBCIN) 400 mg in dextrose 5 % 100 mL IVPB     7 mg/kg  57 kg (Ideal) 110 mL/hr over 60 Minutes Intravenous  Once 12/24/14 1710 12/24/14 1842   12/23/14 1345  cefTAZidime (FORTAZ)  2 g in dextrose 5 % 50 mL IVPB  Status:  Discontinued     2 g 100 mL/hr over 30 Minutes Intravenous Every 12 hours 12/23/14 1340 12/31/14 1044   01/08/2015 2000  linezolid (ZYVOX) IVPB 600 mg  Status:  Discontinued     600 mg 300 mL/hr over 60 Minutes Intravenous Every 12 hours 12/19/2014 1821 12/26/14 1545   12/25/2014 1845  cefTAZidime (FORTAZ) 2 g in dextrose 5 % 50 mL IVPB  Status:  Discontinued     2 g 100 mL/hr over 30 Minutes Intravenous Daily 01/10/2015 1839 12/23/14 1340   01/04/2015 1745  levofloxacin (LEVAQUIN) IVPB 750 mg  Status:  Discontinued     750 mg 100  mL/hr over 90 Minutes Intravenous  Once 01/13/2015 1744 12/24/2014 1820      Assessment: 64 yo female smoker with dyspnea, VDRF from pneumococcal PNA and septic shock. She has hx of HTN, GERD, PAF, CMML, COPD.  Experienced aspiration and bradycardic arrest on 9/17.  Now with GNR in lungs and new renal failure.  Pharmacy dosing Aztreonam.  Temp: afebrile, few low grade temps on 9/17 WBC: peaked at 45, but now returned to wnl Renal: SCr continues to worsen; CrCl 21 CG PCT peaked at 29, now decreasing slowly CXR 9/19: opacities suspicious for PNA  9/5 >> Ceftazidime >> 9/14 9/5 >> Zyvox >>   9/9  9/7 >> Tobramycin >> 9/8 9/17 >> aztreonam >> 9/17 >> linezolid (MD) >> 9/19 9/17 >> metronidazole (MD) >>  9/5 blood: viridans strep 1/2 9/5 Cidff Ag +, Toxin - 9/5 MRSA PCR: positive 9/5 Strep pneumo Ur Ag: negative 9/5 Legionella UAg: neg 9/7 BAL: candida albicans (not relevant per CCM); AFB pending 9/13 pleural fluid cx: NGF 9/13 blood x 2: ngtd 9/17 blood x2:  ngtd 9/17 Urine: ngtd 9/18 trach aspirate: abundant GNR  Goal of Therapy:  Eradication of infection Appropriate antibiotic dosing for indication and renal function  Plan:  Day 4 antibiotics restart  Reduce aztreonam to 500 mg IV q8 hr.  Could justify keeping 1g dose as 2g q8 hr would have been a reasonable starting dose given severity of infection and protracted course.  However, note that UOP has been nil x 2 days now, so expect CrCl to worsen  Follow clinical course, renal function, culture results as available  Follow for de-escalation of antibiotics and LOT   Reuel Boom, PharmD, BCPS Pager: [phone] 01/06/2015, 7:05 AM

## 2015-01-06 NOTE — Progress Notes (Signed)
CPT on hold for now due to pt condition refer to CT/X-RAY.

## 2015-01-06 NOTE — Progress Notes (Signed)
eLink Physician-Brief Progress Note Patient Name: [name] DOB: [birth year] MRN: [id number]   Date of Service  01/06/2015  HPI/Events of Note   Recent Labs Lab 01/04/15 0500 01/04/15 0810 01/05/15 0434 01/05/15 1810 01/06/15 0128  HGB 6.9* 8.1* 5.9* 7.0* 6.0*    Recent Labs Lab 01/03/15 0550 01/03/15 1020 01/03/15 1502 01/04/15 0500 01/05/15 0424  CREATININE 1.58*  1.56* 1.74* 1.73* 2.03* 2.54*      eICU Interventions  1 unit prbc Ct abd/pelvis rule out RP hge     Intervention Category Intermediate Interventions: Other:  RAMASWAMY,MURALI 01/06/2015, 2:02 AM

## 2015-01-06 NOTE — Progress Notes (Signed)
PULMONARY / CRITICAL CARE MEDICINE   Name: [name] MRN: [id number] DOB: [birth year]    ADMISSION DATE:  01/13/2015 CONSULTATION DATE:  12/23/2014  REFERRING MD :  Dr. Karleen Hampshire   CHIEF COMPLAINT:  Shortness of breath   INITIAL PRESENTATION:  64 yo female smoker with dyspnea, VDRF from pneumococcal PNA and septic shock.  She has hx of HTN, GERD, PAF, CMML, COPD.  STUDIES:  9/05  CT Chest w/o >> diffuse bilateral airspace disease, consolidation involving all lobes of L/R lung, several discrete nodules present in upper lobes, bulky mediastinal adenopathy 9/16  CT Abd/Pelvis >> fluid collections noted along the R anterior / lateral abdominal wall extending from the RUQ to the R lower lateral abdomen above the iliac crest - ? Seromas from prior hemorrhage with hx of anemia, ? Infected fluid collections, measurement shows these collections to be the density of water.  Anasarca with ascites, small pleural effusions, atx in lower lobes, partial atx of RML, mild splenomegaly 9/17 TTE >> LV normal in size. Moderate concentric LVH. Appearance consistent with hypertrophic cardiomyopathy. EF 80-85%. No regional wall motion abnormalities. Grade 1 diastolic dysfunction. Dynamic obstruction. No significant change from prior echocardiogram except for increased EF. No pericardial effusion. IVC normal in size. Pulmonary artery systolic pressure normal. RV normal in size and function. 9/20  CT Abd/Pelvis >> interval hemorrhage into previously described large fluid collections within the right anterior / lateral abdominal wall, diffuse anasarca, moderate ascites, pulmonary consolidation within the bilateral lower lobes with adjacent small effusions, mild splenomegaly  SIGNIFICANT EVENTS: 9/05  Admit with worsening SOB  9/06  intubated for hypoxia while receiving fluid bolus ? Edema, levo gtt 9/10  Extubated, transfuse 1 unit PRBC 9/11  A fib with RVR >> started amiodarone/levophed, respiratory failure >>  re-intubated 9/12  mottled (hx of Raynauds) ECHO pending. Appropriate. Scr bumped.  9/13  Diagnostic/Therapeutic right Thoracentesis. 650 ML clear appearing. Transudate by [facility]  9/14  lasix for neg volume, weaning.  9/15  lasix and albumin. Passed SBT but had sig WOB and tachycardia.  9/16  failed SBT. Still positive volume in spite of lasix. Still on low dose pressors but may be r/t diprivan. Albumin/lasix x 3 doses. Family updated. Will likely need trach. Anemic. Getting 2 units blood.  CT of abd with fluid collection that appears to be the density of water 9/17  Bradycardic arrest after N/V & probable aspiration.  ACLS. 9/19  Anemia, tx 2 units.  Repeat CT found bleeding into fluid collection, hypoglycemia, AMS with pH 6.9, vent changes / changed vent out and repeat 7.4   SUBJECTIVE:   Anemia noted overnight, transfused one unit PRBC, hypoglycemia, period of AMS with acidosis, vent changed out and rate adjusted with improvement.  Off levophed   VITAL SIGNS: Temp:  [97.5 F (36.4 C)-98.4 F (36.9 C)] 97.7 F (36.5 C) (09/20 0843) Pulse Rate:  [54-122] 74 (09/20 0900) Resp:  [0-28] 28 (09/20 0900) BP: (88-135)/(45-90) 115/80 mmHg (09/20 0900) SpO2:  [90 %-100 %] 100 % (09/20 0900) FiO2 (%):  [40 %] 40 % (09/20 0800) Weight:  [185 lb 13.6 oz (84.3 kg)] 185 lb 13.6 oz (84.3 kg) (09/20 0500)      VENTILATOR SETTINGS: Vent Mode:  [-] PRVC FiO2 (%):  [40 %] 40 % Set Rate:  [14 bmp-28 bmp] 28 bmp Vt Set:  [500 mL] 500 mL PEEP:  [5 cmH20] 5 cmH20 Plateau Pressure:  [21 cmH20-25 cmH20] 21 cmH20   INTAKE / OUTPUT:  Intake/Output Summary (Last 24 hours) at 01/06/15 0904 Last data filed at 01/06/15 0900  Gross per 24 hour  Intake 1653.56 ml  Output    434 ml  Net 1219.56 ml    PHYSICAL EXAMINATION: General:  Chronically ill adult female in NAD. Integument:  Warm & dry. No rash on exposed skin. Mottling & cyanosis of bilateral upper extremity & lower extremity digits.   Ecchymosis on R flank HEENT:  No scleral injection or icterus. Endotracheal tube in place. PERRL. Cardiovascular:  Tachycardic.  Sinus tachycardia on telemetry. Anasarca ++ Pulmonary:  Coarse breath sounds bilaterally, rhonchi. Symmetric chest wall rise on ventilator. Abdomen: Soft. Normal bowel sounds. Nondistended. Right-sided ostomy in place. Neurological:  Moving all 4 extremities equally. Cranial nerves appear grossly intact. Nods to questions.   LABS:  CBC  Recent Labs Lab 01/05/15 0434 01/05/15 1810 01/06/15 0128  WBC 21.8* 15.4* 5.9  HGB 5.9* 7.0* 6.0*  HCT 17.4* 21.2* 17.9*  PLT 261 233 195   BMET  Recent Labs Lab 01/04/15 0500 01/05/15 0424 01/06/15 0420  NA 135 129* 128*  K 4.5 4.5 4.7  CL 101 96* 97*  CO2 22 19* 18*  BUN 61* 68* 80*  CREATININE 2.03* 2.54* 2.87*  GLUCOSE 123* 123* 250*   Electrolytes  Recent Labs Lab 01/04/15 0500 01/05/15 0424 01/05/15 0434 01/06/15 0420  CALCIUM 8.2* 8.0*  --  7.5*  MG 2.2  --  2.1 2.1  PHOS 6.9* 7.2*  --  8.5*    Sepsis Markers  Recent Labs Lab 01/02/15 2320 01/03/15 1000 01/03/15 1615 01/03/15 2100 01/04/15 0500 01/05/15 0434  LATICACIDVEN 0.8 5.7* 1.6  --   --   --   PROCALCITON  --   --   --  26.94 29.42 24.77   ABG  Recent Labs Lab 01/05/15 2035 01/05/15 2335  PHART 6.992* 7.414  PCO2ART 85.4* 27.4*  PO2ART 89.4 115*     Liver Enzymes  Recent Labs Lab 12/31/14 1830  01/03/15 1020 01/04/15 0500 01/05/15 0424 01/06/15 0420  AST 14*  --  30  --   --   --   ALT 9*  --  13*  --   --   --   ALKPHOS 117  --  163*  --   --   --   BILITOT 0.3  --  0.8  --   --   --   ALBUMIN 1.6*  < > 2.2* 1.8* 1.7* 1.6*  < > = values in this interval not displayed.   Cardiac Enzymes  Recent Labs Lab 01/03/15 1020 01/03/15 1615 01/03/15 2100  TROPONINI 0.04* 0.41* 0.33*    Glucose  Recent Labs Lab 01/05/15 1548 01/05/15 2003 01/05/15 2307 01/06/15 0342 01/06/15 0405 01/06/15 0425   GLUCAP 109* 102* 88 38* 53* 101*    Imaging Ct Abdomen Pelvis Wo Contrast  01/06/2015   CLINICAL DATA:  Patient with low hemoglobin. Evaluate for hemorrhage. Patient on ventilator with elevated LFTs.  EXAM: CT ABDOMEN AND PELVIS WITHOUT CONTRAST  TECHNIQUE: Multidetector CT imaging of the abdomen and pelvis was performed following the standard protocol without IV contrast.  COMPARISON:  CT abdomen pelvis 01/02/2015  FINDINGS: Lower chest: Normal heart size. Small to moderate bilateral pleural effusions with consolidation in the visualized bilateral lower lobes. Additional scattered ground-glass opacities within the visualized lungs bilaterally.  Hepatobiliary: Liver is normal in size and contour. Gallbladder is unremarkable.  Pancreas: Unremarkable  Spleen: Enlarged measuring 14 cm.  Adrenals/Urinary Tract: Unremarkable adrenal  glands. Kidneys are symmetric in size. No hydronephrosis. Urinary bladder is decompressed with a Foley catheter.  Stomach/Bowel: Enteric tube terminates within the stomach. No abnormal bowel wall thickening or evidence for bowel obstruction. Anastomotic sutures are demonstrated within the distal colon as well as within the right upper quadrant. There is a moderate amount of free fluid within the abdomen, the majority of which is located within the pelvis. Right lower quadrant colostomy.  Vascular/Lymphatic: Normal caliber abdominal aorta. Multiple prominent retroperitoneal lymph nodes, grossly stable. Peripheral calcified atherosclerotic plaque.  Other: Diffuse anasarca. Multiple fluid collections are demonstrated along the right anterior and lateral abdominal wall measuring up to 13.7 cm. In the interval there has been development of high attenuation within these fluid collections, compatible with interval hemorrhage. Anterior abdominal wall defect with fascia covering the underlying peritoneal cavity.  Musculoskeletal: Left hip arthroplasty. Degenerative changes lower lumbar spine.  No aggressive or acute appearing osseous lesions.  IMPRESSION: Interval hemorrhage into previously described large fluid collections within the right anterior and lateral abdominal wall.  Diffuse anasarca.  Moderate ascites.  Pulmonary consolidation within the bilateral lower lobes with adjacent small pleural effusions. Findings may represent atelectasis, infection or edema.  Mild splenomegaly.  Critical Value/emergent results were called by telephone at the time of interpretation on 01/06/2015 at 7:33 am to Nurse Leandra Kern, RN, who verbally acknowledged these results.   Electronically Signed   By: Lovey Newcomer M.D.   On: 01/06/2015 07:41   Dg Chest Port 1 View  01/06/2015   CLINICAL DATA:  Respiratory failure.  EXAM: PORTABLE CHEST - 1 VIEW  COMPARISON:  01/05/2015.  FINDINGS: Endotracheal to, PICC line are in stable position. NG tube noted with its tip below the lower portion of the image. Hemidiaphragms are not imaged. Persistent cardiomegaly with progressive bilateral pulmonary alveolar infiltrates and pleural effusions consistent with progressive congestive heart failure with pulmonary edema. No pneumothorax.  IMPRESSION: 1. Endotracheal tube and PICC line in stable position. Inferior tip of the NG tube is not imaged. The hemidiaphragms are not imaged. 2. Cardiomegaly with progressive bilateral pulmonary edema and pleural effusions consistent with progressive congestive heart failure.   Electronically Signed   By: Marcello Moores  Register   On: 01/06/2015 07:14   Dg Chest Port 1 View  01/06/2015   CLINICAL DATA:  Hypoxia  EXAM: PORTABLE CHEST - 1 VIEW  COMPARISON:  01/05/2015 at 0145 hour  FINDINGS: Endotracheal tube terminates 4 cm above the carina.  Multifocal patchy opacities, bilateral lower lobe predominant, suspicious for pneumonia. Suspected small bilateral pleural effusions, left greater than right.  The heart is top-normal in size.  Right arm PICC terminates in the mid/lower SVC. Enteric tube courses  below the diaphragm.  IMPRESSION: Endotracheal tube terminates 4 cm above the carina.  Multifocal patchy opacities, suspicious for pneumonia.  Small bilateral pleural effusions, left greater than right.   Electronically Signed   By: Julian Hy M.D.   On: 01/06/2015 00:55   Dg Abd Portable 1v  01/05/2015   CLINICAL DATA:  Nausea, left lower quadrant abdominal pain  EXAM: PORTABLE ABDOMEN - 1 VIEW  COMPARISON:  Abdominal plain film exams dated 01/03/2015 and 12/31/2014.  FINDINGS: Enteric tube remains adequately positioned in the stomach. Surgical clips again noted within the right abdomen which appear related to previous partial bowel resection. Colostomy again noted overlying the right abdomen.  There is a mildly prominent gas-filled loop of small bowel at the surgical anastomosis in the right abdomen. There is a paucity  of bowel gas otherwise. No significantly dilated large or small bowel loops identified.  No evidence of free intraperitoneal air. No evidence of abnormal fluid collection. Small layering pleural effusions are again seen at each lung base, with persistent bibasilar pulmonary opacities which could represent edema or infiltrate (favor edema).  IMPRESSION: 1. No evidence of bowel obstruction identified, although there is a paucity of bowel gas throughout the abdomen and pelvis. Patulous, mildly prominent gas-filled loop of small bowel noted in the right lower quadrant at a site of a previous bowel anastomosis, similar to previous studies. 2. Enteric tube remains adequately positioned in the stomach. 3. Small bilateral pleural effusions, with adjacent bibasilar pulmonary opacities which likely represent persistent edema, not significantly changed in the interval.   Electronically Signed   By: Franki Cabot M.D.   On: 01/05/2015 15:49    ASSESSMENT / PLAN:  PULMONARY ETT 9/06 >> 9/10 ETT 9/11 >>  A: Acute Hypoxic Respiratory Failure - Multifactorial from Pneumonia, Volume Overload &  acute aspiration event (9/17) Pink/Frothy Sputum - resolved Bronchospasm Bilateral Pleural Effusions - S/P Thoracentesis/Transudate. Presumed COPD - H/O Tobacco Use. Hypercarbia - 9/19 ABG 6.9, pco2 85  P:   Continue PS trials as tolerated Plan for perc Trach later in week, discussed with Sister [facility]) Scheduled Duonebs Budesonide nebs Wean PEEP / FiO2 for sats > 90% Intermittent CXR Adjust vent rate to 20 Solu-medrol 60 mg BID x 3 doses  CARDIOVASCULAR Rt PICC 9/06 >> A:  Bradycardic Arrest 9/17 - Likely due to aspiration. A-Fib with RVR- converted to NSR. Currently ST.  Shock - Likely ongoing due to sedation, off pressors on 9/20 H/O hypertension  P:  Monitoring patient on telemetry Wean off pressors to keep MAP > 65 Holding ASA SCD's only with bleeding  RENAL A:   Acute Renal Failure - worsening with anuria 9/20. Hypervolemia - due to hypoalbuminemia Metabolic acidosis  Hypokalemia - resolved Hypomagnesemia - resolved. Hyponatremia   P:   Trending electrolytes daily Monitor UOP w/ foley catheter Monitoring daily BUN/Creatinine Nephrology evaluation pending, appreciate input  GASTROINTESTINAL A:   Abdominal Hemorrhage into Fluid Collections on R  Chronic diarrhea with C diff colonization Hypoalbuminemia  N/V - Holding tube feedings Hx of GERD  P:   Hold TF with recent emesis  OGT to suction Protonix IV daily Zofran IV prn See ID Do not think she would be a surgical candidate  HEMATOLOGIC A:   Anemia - No signs of active bleeding on CT. S/P 2u PRBC 9/16. & 1u PRBC 9/17. Hx of CMML with chronic anemia.  P:  Trend Hgb daily w/ CBC SCDs Transfuse for Hgb <7 Follow H/H at noon  INFECTIOUS A:   Septic shock - Secondary to Pneumonia & questionable bacteremia. Questionable Streptococcal Pneumonia - Completed Tressie Ellis 9/14 Streptococcal Viridans Bacteremia - Completed Tressie Ellis 9/14  Cdiff colonization  P:   Trend Leukocytosis Procalcitonin  algorithm  Linezolid 9/17>> 9/19 Aztreonam 9/17>> Flagyl IV 9/17>>  Blood Ctx 9/17>> Urine Ctx 9/17>> Resp Ctx 9/17>>  Blood Ctx 9/13 >> negative  R Pleural Effusion 9/13 >> negative  BAL 9/7>>Bacteria Neg/AFB pending/Fungal pending  Blood Ctx 9/5>> 1/2 positive for Strep Viridans Urine Strep Ag 9/5>>negative Urine Leg Ag 9/5>>negative  ENDOCRINE A: Hyperglycemia  P: Monitor BG & RN to notify for <90 or >160 D10 @ 50 ml/hr  NEUROLOGY A: Chest Pain post resuscitation H/O Chronic pain Depression  P: RASS goal: 0 to -1 Fentanyl IV prn   TODAYS SUMMARY:  64 year old female with atrial fibrillation and pneumonia with repeat intubation due to hypoxic respiratory failure. Patient has had failure to wean likely owing to pulmonary edema from hypoalbuminemic state. Patient suffered bradycardic arrest on 9/17 after episode of emesis. Patient had no evidence of bowel obstruction or residual tube feeds prior to this event. With elevation of Procalcitonin and no obvious source for shock otherwise in the face of leukocytosis broad-spectrum antibiotics were restarted after repeating cultures. Hgb dropped overnight 9/19.  Repeat CT abd showed bleeding into prior fluid collections. Clinical prognosis remains guarded given multiple medical problems. Family very involved in patients care.     Sister updated on patients status 9/20.     Noe Gens, NP-C  Pulmonary & Critical Care Pgr: [phone] or if no answer [phone] 01/06/2015, 9:04 AM

## 2015-01-06 NOTE — Progress Notes (Signed)
[id number]/64 year old female with atrial fibrillation and pneumonia with repeat intubation due to hypoxic respiratory failure. Patient has had failure to wean likely owing to pulmonary edema from hypoalbuminemic state. Patient suffered bradycardic arrest on 9/17 after episode of emesis. Patient had no evidence of bowel obstruction or residual tube feeds prior to this event. With elevation of Procalcitonin and no obvious source for shock otherwise in the face of leukocytosis broad-spectrum antibiotics were restarted after repeating cultures. Hgb dropped overnight 9/19. Repeat CT abd showed bleeding into prior fluid collections. Clinical prognosis remains guarded given multiple medical problems. Family very involved in patients care.

## 2015-01-06 NOTE — Progress Notes (Signed)
Hypoglycemic Event  CBG: 53  Treatment: D50 IV 50 mL  Symptoms: Pale  Follow-up CBG: Time: 0424 CBG Result:101  Possible Reasons for Event: Unknown  Comments/MD notified: Meredeth Ide  Remember to initiate Hypoglycemia Order Set & complete

## 2015-01-07 ENCOUNTER — Inpatient Hospital Stay (HOSPITAL_COMMUNITY): Payer: 59

## 2015-01-07 LAB — POCT I-STAT, CHEM 8
BUN: 28 mg/dL — AB (ref 6–20)
BUN: 30 mg/dL — AB (ref 6–20)
BUN: 31 mg/dL — AB (ref 6–20)
BUN: 32 mg/dL — AB (ref 6–20)
BUN: 34 mg/dL — AB (ref 6–20)
BUN: 34 mg/dL — AB (ref 6–20)
BUN: 34 mg/dL — ABNORMAL HIGH (ref 6–20)
BUN: 36 mg/dL — AB (ref 6–20)
BUN: 38 mg/dL — AB (ref 6–20)
BUN: 38 mg/dL — ABNORMAL HIGH (ref 6–20)
BUN: 41 mg/dL — AB (ref 6–20)
BUN: 43 mg/dL — ABNORMAL HIGH (ref 6–20)
CALCIUM ION: 0.43 mmol/L — AB (ref 1.13–1.30)
CALCIUM ION: 0.44 mmol/L — AB (ref 1.13–1.30)
CALCIUM ION: 0.81 mmol/L — AB (ref 1.13–1.30)
CHLORIDE: 93 mmol/L — AB (ref 101–111)
CHLORIDE: 93 mmol/L — AB (ref 101–111)
CHLORIDE: 94 mmol/L — AB (ref 101–111)
CHLORIDE: 94 mmol/L — AB (ref 101–111)
CHLORIDE: 94 mmol/L — AB (ref 101–111)
CHLORIDE: 95 mmol/L — AB (ref 101–111)
CHLORIDE: 95 mmol/L — AB (ref 101–111)
CHLORIDE: 96 mmol/L — AB (ref 101–111)
CHLORIDE: 96 mmol/L — AB (ref 101–111)
CREATININE: 1.3 mg/dL — AB (ref 0.44–1.00)
CREATININE: 1.5 mg/dL — AB (ref 0.44–1.00)
CREATININE: 1.6 mg/dL — AB (ref 0.44–1.00)
CREATININE: 1.8 mg/dL — AB (ref 0.44–1.00)
CREATININE: 1.8 mg/dL — AB (ref 0.44–1.00)
CREATININE: 1.9 mg/dL — AB (ref 0.44–1.00)
Calcium, Ion: 0.62 mmol/L — CL (ref 1.13–1.30)
Calcium, Ion: 0.92 mmol/L — ABNORMAL LOW (ref 1.13–1.30)
Calcium, Ion: 0.49 mmol/L — CL (ref 1.13–1.30)
Calcium, Ion: 0.88 mmol/L — ABNORMAL LOW (ref 1.13–1.30)
Calcium, Ion: 0.46 mmol/L — CL (ref 1.13–1.30)
Calcium, Ion: 0.89 mmol/L — ABNORMAL LOW (ref 1.13–1.30)
Calcium, Ion: 0.79 mmol/L — ABNORMAL LOW (ref 1.13–1.30)
Calcium, Ion: 0.47 mmol/L — CL (ref 1.13–1.30)
Calcium, Ion: 0.88 mmol/L — ABNORMAL LOW (ref 1.13–1.30)
Chloride: 94 mmol/L — ABNORMAL LOW (ref 101–111)
Chloride: 97 mmol/L — ABNORMAL LOW (ref 101–111)
Chloride: 94 mmol/L — ABNORMAL LOW (ref 101–111)
Creatinine, Ser: 1.8 mg/dL — ABNORMAL HIGH (ref 0.44–1.00)
Creatinine, Ser: 2 mg/dL — ABNORMAL HIGH (ref 0.44–1.00)
Creatinine, Ser: 1.6 mg/dL — ABNORMAL HIGH (ref 0.44–1.00)
Creatinine, Ser: 2 mg/dL — ABNORMAL HIGH (ref 0.44–1.00)
Creatinine, Ser: 1.4 mg/dL — ABNORMAL HIGH (ref 0.44–1.00)
Creatinine, Ser: 1.3 mg/dL — ABNORMAL HIGH (ref 0.44–1.00)
GLUCOSE: 133 mg/dL — AB (ref 65–99)
GLUCOSE: 139 mg/dL — AB (ref 65–99)
GLUCOSE: 179 mg/dL — AB (ref 65–99)
Glucose, Bld: 127 mg/dL — ABNORMAL HIGH (ref 65–99)
Glucose, Bld: 134 mg/dL — ABNORMAL HIGH (ref 65–99)
Glucose, Bld: 137 mg/dL — ABNORMAL HIGH (ref 65–99)
Glucose, Bld: 151 mg/dL — ABNORMAL HIGH (ref 65–99)
Glucose, Bld: 159 mg/dL — ABNORMAL HIGH (ref 65–99)
Glucose, Bld: 162 mg/dL — ABNORMAL HIGH (ref 65–99)
Glucose, Bld: 163 mg/dL — ABNORMAL HIGH (ref 65–99)
Glucose, Bld: 166 mg/dL — ABNORMAL HIGH (ref 65–99)
Glucose, Bld: 166 mg/dL — ABNORMAL HIGH (ref 65–99)
HCT: 25 % — ABNORMAL LOW (ref 36.0–46.0)
HCT: 25 % — ABNORMAL LOW (ref 36.0–46.0)
HCT: 27 % — ABNORMAL LOW (ref 36.0–46.0)
HCT: 27 % — ABNORMAL LOW (ref 36.0–46.0)
HEMATOCRIT: 24 % — AB (ref 36.0–46.0)
HEMATOCRIT: 23 % — AB (ref 36.0–46.0)
HEMATOCRIT: 24 % — AB (ref 36.0–46.0)
HEMATOCRIT: 29 % — AB (ref 36.0–46.0)
HEMATOCRIT: 25 % — AB (ref 36.0–46.0)
HEMATOCRIT: 26 % — AB (ref 36.0–46.0)
HEMATOCRIT: 25 % — AB (ref 36.0–46.0)
HEMATOCRIT: 29 % — AB (ref 36.0–46.0)
HEMOGLOBIN: 8.2 g/dL — AB (ref 12.0–15.0)
HEMOGLOBIN: 7.8 g/dL — AB (ref 12.0–15.0)
HEMOGLOBIN: 8.8 g/dL — AB (ref 12.0–15.0)
Hemoglobin: 8.2 g/dL — ABNORMAL LOW (ref 12.0–15.0)
Hemoglobin: 8.5 g/dL — ABNORMAL LOW (ref 12.0–15.0)
Hemoglobin: 8.5 g/dL — ABNORMAL LOW (ref 12.0–15.0)
Hemoglobin: 8.5 g/dL — ABNORMAL LOW (ref 12.0–15.0)
Hemoglobin: 8.5 g/dL — ABNORMAL LOW (ref 12.0–15.0)
Hemoglobin: 9.2 g/dL — ABNORMAL LOW (ref 12.0–15.0)
Hemoglobin: 9.2 g/dL — ABNORMAL LOW (ref 12.0–15.0)
Hemoglobin: 9.9 g/dL — ABNORMAL LOW (ref 12.0–15.0)
Hemoglobin: 9.9 g/dL — ABNORMAL LOW (ref 12.0–15.0)
POTASSIUM: 3.9 mmol/L (ref 3.5–5.1)
POTASSIUM: 4 mmol/L (ref 3.5–5.1)
POTASSIUM: 3.9 mmol/L (ref 3.5–5.1)
POTASSIUM: 3.9 mmol/L (ref 3.5–5.1)
POTASSIUM: 3.8 mmol/L (ref 3.5–5.1)
POTASSIUM: 3.8 mmol/L (ref 3.5–5.1)
POTASSIUM: 3.8 mmol/L (ref 3.5–5.1)
Potassium: 3.6 mmol/L (ref 3.5–5.1)
Potassium: 3.7 mmol/L (ref 3.5–5.1)
Potassium: 3.8 mmol/L (ref 3.5–5.1)
Potassium: 4 mmol/L (ref 3.5–5.1)
Potassium: 4.1 mmol/L (ref 3.5–5.1)
SODIUM: 133 mmol/L — AB (ref 135–145)
SODIUM: 133 mmol/L — AB (ref 135–145)
SODIUM: 131 mmol/L — AB (ref 135–145)
SODIUM: 134 mmol/L — AB (ref 135–145)
SODIUM: 134 mmol/L — AB (ref 135–145)
Sodium: 131 mmol/L — ABNORMAL LOW (ref 135–145)
Sodium: 132 mmol/L — ABNORMAL LOW (ref 135–145)
Sodium: 132 mmol/L — ABNORMAL LOW (ref 135–145)
Sodium: 132 mmol/L — ABNORMAL LOW (ref 135–145)
Sodium: 133 mmol/L — ABNORMAL LOW (ref 135–145)
Sodium: 134 mmol/L — ABNORMAL LOW (ref 135–145)
Sodium: 134 mmol/L — ABNORMAL LOW (ref 135–145)
TCO2: 19 mmol/L (ref 0–100)
TCO2: 20 mmol/L (ref 0–100)
TCO2: 21 mmol/L (ref 0–100)
TCO2: 21 mmol/L (ref 0–100)
TCO2: 21 mmol/L (ref 0–100)
TCO2: 21 mmol/L (ref 0–100)
TCO2: 21 mmol/L (ref 0–100)
TCO2: 21 mmol/L (ref 0–100)
TCO2: 22 mmol/L (ref 0–100)
TCO2: 22 mmol/L (ref 0–100)
TCO2: 22 mmol/L (ref 0–100)
TCO2: 22 mmol/L (ref 0–100)

## 2015-01-07 LAB — CBC WITH DIFFERENTIAL/PLATELET
BASOS PCT: 0 %
Basophils Absolute: 0 10*3/uL (ref 0.0–0.1)
EOS PCT: 0 %
Eosinophils Absolute: 0 10*3/uL (ref 0.0–0.7)
HEMATOCRIT: 23.2 % — AB (ref 36.0–46.0)
HEMOGLOBIN: 7.8 g/dL — AB (ref 12.0–15.0)
LYMPHS ABS: 1.2 10*3/uL (ref 0.7–4.0)
Lymphocytes Relative: 12 %
MCH: 28.7 pg (ref 26.0–34.0)
MCHC: 33.6 g/dL (ref 30.0–36.0)
MCV: 85.3 fL (ref 78.0–100.0)
MONOS PCT: 22 %
Monocytes Absolute: 2.3 10*3/uL — ABNORMAL HIGH (ref 0.1–1.0)
NEUTROS ABS: 6.8 10*3/uL (ref 1.7–7.7)
Neutrophils Relative %: 66 %
Platelets: 235 10*3/uL (ref 150–400)
RBC: 2.72 MIL/uL — ABNORMAL LOW (ref 3.87–5.11)
RDW: 19 % — AB (ref 11.5–15.5)
WBC: 10.3 10*3/uL (ref 4.0–10.5)

## 2015-01-07 LAB — BLOOD GAS, ARTERIAL
Acid-base deficit: 4.2 mmol/L — ABNORMAL HIGH (ref 0.0–2.0)
BICARBONATE: 20.4 meq/L (ref 20.0–24.0)
Drawn by: 307971
FIO2: 0.4
LHR: 20 {breaths}/min
MECHVT: 500 mL
O2 Saturation: 91.1 %
PATIENT TEMPERATURE: 98.6
PCO2 ART: 37.7 mmHg (ref 35.0–45.0)
PEEP: 5 cmH2O
PO2 ART: 68.4 mmHg — AB (ref 80.0–100.0)
TCO2: 19.4 mmol/L (ref 0–100)
pH, Arterial: 7.352 (ref 7.350–7.450)

## 2015-01-07 LAB — RENAL FUNCTION PANEL
ALBUMIN: 1.7 g/dL — AB (ref 3.5–5.0)
ANION GAP: 12 (ref 5–15)
Albumin: 1.7 g/dL — ABNORMAL LOW (ref 3.5–5.0)
Anion gap: 12 (ref 5–15)
BUN: 62 mg/dL — AB (ref 6–20)
BUN: 47 mg/dL — AB (ref 6–20)
CALCIUM: 7.4 mg/dL — AB (ref 8.9–10.3)
CHLORIDE: 96 mmol/L — AB (ref 101–111)
CO2: 20 mmol/L — ABNORMAL LOW (ref 22–32)
CO2: 23 mmol/L (ref 22–32)
Calcium: 7.4 mg/dL — ABNORMAL LOW (ref 8.9–10.3)
Chloride: 98 mmol/L — ABNORMAL LOW (ref 101–111)
Creatinine, Ser: 2.24 mg/dL — ABNORMAL HIGH (ref 0.44–1.00)
Creatinine, Ser: 1.84 mg/dL — ABNORMAL HIGH (ref 0.44–1.00)
GFR calc Af Amer: 25 mL/min — ABNORMAL LOW (ref >60)
GFR calc Af Amer: 32 mL/min — ABNORMAL LOW (ref >60)
GFR calc non Af Amer: 22 mL/min — ABNORMAL LOW (ref >60)
GFR, EST NON AFRICAN AMERICAN: 28 mL/min — AB (ref >60)
GLUCOSE: 108 mg/dL — AB (ref 65–99)
GLUCOSE: 144 mg/dL — AB (ref 65–99)
PHOSPHORUS: 4.6 mg/dL (ref 2.5–4.6)
POTASSIUM: 4.4 mmol/L (ref 3.5–5.1)
POTASSIUM: 4 mmol/L (ref 3.5–5.1)
Phosphorus: 6.2 mg/dL — ABNORMAL HIGH (ref 2.5–4.6)
SODIUM: 133 mmol/L — AB (ref 135–145)
Sodium: 128 mmol/L — ABNORMAL LOW (ref 135–145)

## 2015-01-07 LAB — TYPE AND SCREEN
ABO/RH(D): A POS
Antibody Screen: NEGATIVE
DONOR AG TYPE: NEGATIVE
Unit division: 0

## 2015-01-07 LAB — GLUCOSE, CAPILLARY
GLUCOSE-CAPILLARY: 114 mg/dL — AB (ref 65–99)
GLUCOSE-CAPILLARY: 113 mg/dL — AB (ref 65–99)
GLUCOSE-CAPILLARY: 177 mg/dL — AB (ref 65–99)
Glucose-Capillary: 107 mg/dL — ABNORMAL HIGH (ref 65–99)
Glucose-Capillary: 112 mg/dL — ABNORMAL HIGH (ref 65–99)
Glucose-Capillary: 124 mg/dL — ABNORMAL HIGH (ref 65–99)
Glucose-Capillary: 126 mg/dL — ABNORMAL HIGH (ref 65–99)
Glucose-Capillary: 99 mg/dL (ref 65–99)

## 2015-01-07 LAB — URINE CULTURE
Culture: >=100000
Special Requests: NORMAL

## 2015-01-07 LAB — TRIGLYCERIDES: Triglycerides: 156 mg/dL — ABNORMAL HIGH (ref <150)

## 2015-01-07 LAB — APTT: aPTT: 25 seconds (ref 24–37)

## 2015-01-07 LAB — PROTIME-INR
INR: 1.12 (ref 0.00–1.49)
PROTHROMBIN TIME: 14.6 s (ref 11.6–15.2)

## 2015-01-07 LAB — MAGNESIUM: MAGNESIUM: 2.1 mg/dL (ref 1.7–2.4)

## 2015-01-07 MED ORDER — NEPRO/CARBSTEADY PO LIQD
1000.0000 mL | ORAL | Status: DC
  Administered 2015-01-07: 1000 mL via ORAL
  Filled 2015-01-07 (×2): qty 1000

## 2015-01-07 MED ORDER — DEXTROSE 5 % IV SOLN
1.0000 g | Freq: Three times a day (TID) | INTRAVENOUS | Status: DC
  Administered 2015-01-07 – 2015-01-09 (×6): 1 g via INTRAVENOUS
  Filled 2015-01-07 (×7): qty 1

## 2015-01-07 MED ORDER — PRO-STAT SUGAR FREE PO LIQD
30.0000 mL | Freq: Two times a day (BID) | ORAL | Status: DC
  Administered 2015-01-07 – 2015-01-11 (×10): 30 mL
  Filled 2015-01-07 (×9): qty 30

## 2015-01-07 MED ORDER — VITAL HIGH PROTEIN PO LIQD
1000.0000 mL | ORAL | Status: DC
  Administered 2015-01-07: 1000 mL
  Filled 2015-01-07: qty 1000

## 2015-01-07 MED ORDER — PRISMASOL B22GK 4/0 22-4 MEQ/L IV SOLN
INTRAVENOUS | Status: DC
  Administered 2015-01-07 – 2015-01-12 (×34): via INTRAVENOUS_CENTRAL
  Filled 2015-01-07 (×47): qty 5000

## 2015-01-07 MED ORDER — PRISMASOL B22GK 4/0 22-4 MEQ/L IV SOLN
INTRAVENOUS | Status: DC
  Administered 2015-01-07 – 2015-01-10 (×4): via INTRAVENOUS_CENTRAL
  Filled 2015-01-07 (×10): qty 5000

## 2015-01-07 MED ORDER — DEXTROSE 5 % IV SOLN
20.0000 g | INTRAVENOUS | Status: DC
  Administered 2015-01-07 – 2015-01-12 (×10): 20 g via INTRAVENOUS_CENTRAL
  Filled 2015-01-07 (×19): qty 200

## 2015-01-07 MED ORDER — DEXTROSE 5 % IV SOLN
Status: DC
  Administered 2015-01-07 – 2015-01-12 (×14): via INTRAVENOUS_CENTRAL
  Filled 2015-01-07 (×23): qty 1500

## 2015-01-07 MED ORDER — PRISMASOL B22GK 4/0 22-4 MEQ/L IV SOLN
INTRAVENOUS | Status: DC
  Administered 2015-01-10 – 2015-01-12 (×2): via INTRAVENOUS_CENTRAL
  Filled 2015-01-07 (×13): qty 5000

## 2015-01-07 NOTE — Progress Notes (Signed)
Nutrition Follow-up  INTERVENTION:   Change tube feeding formula now that pt receiving CRRT: Initiate Nepro @ 15 ml/hr via OGT and increase by 10 ml every 8 hours to goal rate of 30 ml/hr.   30 ml Prostat BID  Tube feeding regimen + current Propofol infusion provides 1726 kcal (100% of needs), 88 grams of protein, and 523 ml of H2O.   RD to continue to monitor  NUTRITION DIAGNOSIS:   Inadequate oral intake related to inability to eat as evidenced by NPO status.  Ongoing.  GOAL:   Patient will meet greater than or equal to 90% of their needs  Not meeting.  MONITOR:   Vent status, TF tolerance, Weight trends, Labs, I & O's  REASON FOR ASSESSMENT:   Consult Enteral/tube feeding initiation and management  ASSESSMENT:   64 y.o. female with a past medical history of COPD, cigarette smoker, history of multiple pneumonias, history of C. difficile, history of partial colectomy and ileostomy, paroxysmal atrial fibrillation hypertension, chronic myelocytic monoclonal leukemia, chronic anemia that requires Epogen injections and occasional blood transfusions, MRSA carrier, GERD who comes to the emergency department due to worsening shortness of breath, pleuritic chest pain, fatigue, cold sweats for several days. She has also been having occasionally productive cough of mucus, decreased appetite, difficulty sleeping and dehydration due to high ileostomy output with decreased oral intake.   Pt began CRRT 9/20. RD adjusting tube feeding formula and rate to better meets needs.   Patient is currently intubated on ventilator support MV: 9.5 L/min Temp (24hrs), Avg:97.2 F (36.2 C), Min:96 F (35.6 C), Max:98 F (36.7 C)  Propofol: 8.7 ml/hr ->provides 230 fat kcal  Labs reviewed: Low Na Elevated BUN, Creatinine, Phos Mg WNL  Diet Order:  Diet NPO time specified Diet NPO time specified  Skin:  Wound (see comment) (abdominal incision)  Last BM:  9/21  Height:   Ht Readings  from Last 1 Encounters:  12/30/14 5\' 5"  (1.651 m)    Weight:   Wt Readings from Last 1 Encounters:  01/07/15 184 lb 11.9 oz (83.8 kg)    Ideal Body Weight:  56.82 kg (kg)  BMI:  Body mass index is 30.74 kg/(m^2).  Estimated Nutritional Needs:   Kcal:  1500-2100  Protein:  90-120g  Fluid:  1.5L/day  EDUCATION NEEDS:   No education needs identified at this time  Clayton Bibles, MS, RD, LDN Pager: [phone] After Hours Pager: [phone]

## 2015-01-07 NOTE — Progress Notes (Signed)
[facility] Progress Note   Subjective: was tolerating UF initially with 3.8 L off yesterday but today BP is dropping and pressors had to be restarted (neo gtt at 30 ug/min).   Filed Vitals:   01/07/15 1315 01/07/15 1330 01/07/15 1345 01/07/15 1400  BP: 94/60 77/51 93/54  92/56  Pulse:  89 84 88  Temp:      TempSrc:      Resp: 20 20 20 20   Height:      Weight:      SpO2:  100% 100% 100%   Exam: Small framed WF on vent, alert and responds appropriately  No rash, cyanosis or gangrene Sclera anicteric, throat clear No JVD Chest bilat diffuse coarse BS and some exp wheezing RRR no MRG Abd soft ntnd large mid-abd scar healed over, RLQ ostomy w dark brown contents in bag GU foley in place , minimal cloudy urine Ext 2-3 + bilat LE edema, 1-2+ UE edema Neuro is responsive , nf   Assessment: 1 Acute kidney injury - suspect ATN due to shock/ cardiac arrest which happened 2-3 days ago. Anuric now.  D# 2 of CRRT. Not tolerating UF (in spite of marked vol overload ) as I had hoped. Has diffuse pulm edema on CXR still. Prognosis guarded. Will see if a CVP can be done on the PICC line.  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)   Plan - cont CRRT, citrate for local AC, UF as tolerated 100-200 cc/ hr even w pressors given pulm edema, check CVP    Kelly Splinter MD  pager [phone]    cell [phone]  01/07/2015, 2:31 PM     Recent Labs Lab 01/06/15 0420 01/06/15 1634 01/07/15 0640  NA 128* 131* 128*  K 4.7 5.2* 4.4  CL 97* 98* 96*  CO2 18* 19* 20*  GLUCOSE 250* 126* 108*  BUN 80* 84* 62*  CREATININE 2.87* 2.99* 2.24*  CALCIUM 7.5* 7.8* 7.4*  PHOS 8.5* 9.0* 6.2*    Recent Labs Lab 12/31/14 1830  01/03/15 1020   01/06/15 0420 01/06/15 1634 01/07/15 0640  AST 14*  --  30  --   --   --   --   ALT 9*  --  13*  --   --   --   --   ALKPHOS 117  --  163*  --   --   --   --   BILITOT 0.3  --  0.8  --   --   --   --   PROT 4.1*  --  4.7*  --   --   --   --   ALBUMIN 1.6*  < > 2.2*  < > 1.6* 1.8* 1.7*  < > = values in this interval not displayed.  Recent Labs Lab 01/05/15 0434  01/06/15 0128 01/06/15 1027 01/06/15 1322 01/07/15 0640  WBC 21.8*  < > 5.9 7.4  --  10.3  NEUTROABS 13.5*  --   --  5.3  --  6.8  HGB 5.9*  < > 6.0* 7.7* 7.6* 7.8*  HCT 17.4*  < > 17.9* 22.4* 21.7* 23.2*  MCV 88.3  < > 86.1 85.2  --  85.3  PLT 261  < > 195 195  --  235  < > = values in this interval not displayed. Marland Kitchen alteplase  2 mg Intracatheter Once  . antiseptic oral rinse  7 mL Mouth Rinse QID  . arformoterol  15 mcg Nebulization BID  . aztreonam  1 g Intravenous 3 times per day  . budesonide (PULMICORT) nebulizer solution  0.5 mg Nebulization BID  . chlorhexidine gluconate  15 mL Mouth Rinse BID  . feeding supplement (NEPRO CARB STEADY)  1,000 mL Oral Q24H  . feeding supplement (PRO-STAT SUGAR FREE 64)  30 mL Per Tube BID  . metronidazole  500 mg Intravenous 3 times per day  . pantoprazole (PROTONIX) IV  40 mg Intravenous Q24H  . promethazine  6.25 mg Intravenous Once  . sodium chloride  10-40 mL Intracatheter Q12H   . calcium gluconate infusion for CRRT 20 g (01/07/15 1154)  . dextrose 50 mL/hr at 01/07/15 0530  . phenylephrine (NEO-SYNEPHRINE) Adult infusion 30 mcg/min (01/07/15 1330)  . dialysis replacement fluid (prismasate) 400 mL/hr at 01/07/15 0841  . dialysis replacement fluid (prismasate) 200 mL/hr at 01/06/15 1821  . dialysate (PRISMASATE) 1,500 mL/hr at 01/07/15 1027  . propofol (DIPRIVAN) infusion 25 mcg/kg/min (01/07/15 1349)  . sodium citrate 2 %/dextrose 2.5% solution 3000 mL 250 mL/hr at 01/07/15 1153   albuterol, albuterol, alteplase, fentaNYL (SUBLIMAZE) injection, heparin, metoprolol,  [DISCONTINUED] ondansetron **OR** ondansetron (ZOFRAN) IV, sodium chloride, sodium chloride

## 2015-01-07 NOTE — Progress Notes (Signed)
CPT on hold due to intra-abdominal hemorrhage.

## 2015-01-07 NOTE — Progress Notes (Signed)
[id number] sent to [facility]/Rhonda Davis,RN,BSN,CCM

## 2015-01-07 NOTE — Progress Notes (Signed)
PULMONARY / CRITICAL CARE MEDICINE   Name: [name] MRN: [id number] DOB: Aug 16, 1950    ADMISSION DATE:  12/23/2014 CONSULTATION DATE:  12/23/2014  REFERRING MD :  Dr. Karleen Hampshire   CHIEF COMPLAINT:  Shortness of breath   INITIAL PRESENTATION:  64 yo female smoker with dyspnea, VDRF from pneumococcal PNA and septic shock.  She has hx of HTN, GERD, PAF, CMML, COPD.  Prolonged hospital course complicated by respiratory failure in the setting of PNA initially, then pulmonary edema w AF RVR, failed extubation, bradycardic arrest, anemia, abdominal pain with spontaneous bleeding into fluid collections / intraperitoneal hemorrhage on CT abd and acute renal failure requiring CVVHD.   STUDIES:  9/05  CT Chest w/o >> diffuse bilateral airspace disease, consolidation involving all lobes of L/R lung, several discrete nodules present in upper lobes, bulky mediastinal adenopathy 9/16  CT Abd/Pelvis >> fluid collections noted along the R anterior / lateral abdominal wall extending from the RUQ to the R lower lateral abdomen above the iliac crest - ? Seromas from prior hemorrhage with hx of anemia, ? Infected fluid collections, measurement shows these collections to be the density of water.  Anasarca with ascites, small pleural effusions, atx in lower lobes, partial atx of RML, mild splenomegaly 9/17 TTE >> LV normal in size. Moderate concentric LVH. Appearance consistent with hypertrophic cardiomyopathy. EF 80-85%. No regional wall motion abnormalities. Grade 1 diastolic dysfunction. Dynamic obstruction. No significant change from prior echocardiogram except for increased EF. No pericardial effusion. IVC normal in size. Pulmonary artery systolic pressure normal. RV normal in size and function. 9/20  CT Abd/Pelvis >> interval hemorrhage into previously described large fluid collections within the right anterior / lateral abdominal wall, diffuse anasarca, moderate ascites, pulmonary consolidation within the bilateral  lower lobes with adjacent small effusions, mild splenomegaly  SIGNIFICANT EVENTS: 9/05  Admit with worsening SOB  9/06  intubated for hypoxia while receiving fluid bolus ? Edema, levo gtt 9/10  Extubated, transfuse 1 unit PRBC 9/11  A fib with RVR >> started amiodarone/levophed, respiratory failure >> re-intubated 9/12  mottled (hx of Raynauds) ECHO pending. Appropriate. Scr bumped.  9/13  Diagnostic/Therapeutic right Thoracentesis. 650 ML clear appearing. Transudate by [facility]  9/14  lasix for neg volume, weaning.  9/15  lasix and albumin. Passed SBT but had sig WOB and tachycardia.  9/16  failed SBT. Still positive volume in spite of lasix. Still on low dose pressors but may be r/t diprivan. Albumin/lasix x 3 doses. Family updated. Will likely need trach. Anemic. Getting 2 units blood.  CT of abd with fluid collection that appears to be the density of water 9/17  Bradycardic arrest after N/V & probable aspiration.  ACLS. 9/19  Anemia, tx 2 units.  Repeat CT found bleeding into fluid collection, hypoglycemia, AMS with pH 6.9, vent changes / changed vent out and repeat 7.4 9/20  Anemia noted overnight, transfused one unit PRBC, hypoglycemia, period of AMS with acidosis, vent changed out and rate adjusted with improvement.  Off levophed.  CVVHD started.    SUBJECTIVE:  RN reports pulling net negative 200-300 per hour with CVVHD.  Pt reports ongoing abdominal and back pain. Hgb stable.   VITAL SIGNS: Temp:  [96 F (35.6 C)-98 F (36.7 C)] 97.5 F (36.4 C) (09/21 0652) Pulse Rate:  [66-94] 81 (09/21 0800) Resp:  [15-31] 15 (09/21 0800) BP: (88-146)/(45-80) 102/65 mmHg (09/21 0800) SpO2:  [93 %-100 %] 93 % (09/21 0800) FiO2 (%):  [40 %] 40 % (09/21  7619) Weight:  [184 lb 11.9 oz (83.8 kg)] 184 lb 11.9 oz (83.8 kg) (09/21 0500)      VENTILATOR SETTINGS: Vent Mode:  [-] PRVC FiO2 (%):  [40 %] 40 % Set Rate:  [20 bmp] 20 bmp Vt Set:  [500 mL] 500 mL PEEP:  [5 cmH20] 5  cmH20 Plateau Pressure:  [20 cmH20-22 cmH20] 21 cmH20   INTAKE / OUTPUT:  Intake/Output Summary (Last 24 hours) at 01/07/15 0803 Last data filed at 01/07/15 0800  Gross per 24 hour  Intake 1865.47 ml  Output   3813 ml  Net -1947.53 ml    PHYSICAL EXAMINATION: General:  Chronically ill adult female in NAD. Integument:  Warm & dry. No rash on exposed skin. Mottling & cyanosis of bilateral upper extremity & lower extremity digits.  Ecchymosis on R flank HEENT:  No scleral injection or icterus. Endotracheal tube in place. PERRL. Cardiovascular:  s1s2 rrr, no m/r/g.  Anasarca ++ Pulmonary:  Coarse breath sounds bilaterally, rhonchi. Symmetric chest wall rise on ventilator. Abdomen: Soft. Normal bowel sounds. Nondistended. Right-sided ostomy in place. Neurological:  Moving all 4 extremities equally. Cranial nerves appear grossly intact. Nods to questions, attempts to communicate.   LABS:  CBC  Recent Labs Lab 01/06/15 0128 01/06/15 1027 01/06/15 1322 01/07/15 0640  WBC 5.9 7.4  --  10.3  HGB 6.0* 7.7* 7.6* 7.8*  HCT 17.9* 22.4* 21.7* 23.2*  PLT 195 195  --  235   BMET  Recent Labs Lab 01/06/15 0420 01/06/15 1634 01/07/15 0640  NA 128* 131* 128*  K 4.7 5.2* 4.4  CL 97* 98* 96*  CO2 18* 19* 20*  BUN 80* 84* 62*  CREATININE 2.87* 2.99* 2.24*  GLUCOSE 250* 126* 108*   Electrolytes  Recent Labs Lab 01/05/15 0434 01/06/15 0420 01/06/15 1634 01/07/15 0640  CALCIUM  --  7.5* 7.8* 7.4*  MG 2.1 2.1  --  2.1  PHOS  --  8.5* 9.0* 6.2*    Sepsis Markers  Recent Labs Lab 01/02/15 2320 01/03/15 1000 01/03/15 1615 01/03/15 2100 01/04/15 0500 01/05/15 0434  LATICACIDVEN 0.8 5.7* 1.6  --   --   --   PROCALCITON  --   --   --  26.94 29.42 24.77   ABG  Recent Labs Lab 01/05/15 2035 01/05/15 2335  PHART 6.992* 7.414  PCO2ART 85.4* 27.4*  PO2ART 89.4 115*     Liver Enzymes  Recent Labs Lab 12/31/14 1830  01/03/15 1020  01/06/15 0420 01/06/15 1634  01/07/15 0640  AST 14*  --  30  --   --   --   --   ALT 9*  --  13*  --   --   --   --   ALKPHOS 117  --  163*  --   --   --   --   BILITOT 0.3  --  0.8  --   --   --   --   ALBUMIN 1.6*  < > 2.2*  < > 1.6* 1.8* 1.7*  < > = values in this interval not displayed.   Cardiac Enzymes  Recent Labs Lab 01/03/15 1020 01/03/15 1615 01/03/15 2100  TROPONINI 0.04* 0.41* 0.33*    Glucose  Recent Labs Lab 01/06/15 0808 01/06/15 1221 01/06/15 1552 01/06/15 2047 01/07/15 0018 01/07/15 0424  GLUCAP 98 80 124* 114* 124* 107*    Imaging Dg Chest Port 1 View  01/07/2015   CLINICAL DATA:  Intubation.  Respiratory acidosis.  EXAM:  PORTABLE CHEST - 1 VIEW  COMPARISON:  01/06/2015 .  FINDINGS: Endotracheal tube, bilateral IJ lines, and NG tube in stable position. Heart size stable. Persistent dense bilateral pulmonary infiltrates with bilateral pleural effusions. Stable cardiomegaly. No pneumothorax.  IMPRESSION: 1. Lines and tubes in stable position. 2. Persistent dense diffuse bilateral pulmonary infiltrates with persistent bilateral pleural effusions. No interim change. 3. Persistent stable cardiomegaly.   Electronically Signed   By: Marcello Moores  Register   On: 01/07/2015 07:26   Dg Chest Port 1 View  01/06/2015   CLINICAL DATA:  Hemodialysis patient. Catheter placement. 01/06/2015  EXAM: PORTABLE CHEST - 1 VIEW  COMPARISON:  None.  FINDINGS: Endotracheal tube, nasogastric tube, and right-sided PICC line appear unchanged. A new left IJ central line has been placed, tip overlying the level of the superior vena cava.  There are bilateral pleural effusions. Bilateral lung opacities are noted, stable in appearance. No pneumothorax.  IMPRESSION: 1. Interval placement of left IJ hemodialysis catheter. No pneumothorax. 2. Persistent bilateral infiltrates and significant lung opacities.   Electronically Signed   By: Nolon Nations M.D.   On: 01/06/2015 18:08    ASSESSMENT / PLAN:  PULMONARY ETT 9/06 >>  9/10 ETT 9/11 >>  A: Acute Hypoxic Respiratory Failure - Multifactorial from Pneumonia, Volume Overload & acute aspiration event (9/17) Pulmonary Edema  Pink/Frothy Sputum - resolved Bronchospasm Bilateral Pleural Effusions - S/P Thoracentesis/Transudate. Presumed COPD - H/O Tobacco Use. Hypercarbia - 9/19 ABG 6.9, pco2 85  P:   Continue PS trials as tolerated Plan for perc Trach later in week, discussed with Sister [facility]) Scheduled Duonebs Budesonide nebs Wean PEEP / FiO2 for sats > 90% Intermittent CXR Adjust vent rate to 20 Solu-medrol 60 mg BID x 3 doses Negative balance with CVVHD as tolerated  CARDIOVASCULAR Rt PICC 9/06 >> A:  Bradycardic Arrest 9/17 - Likely due to aspiration. A-Fib with RVR- converted to NSR. Currently ST.  Shock - Likely ongoing due to sedation, off pressors on 9/20 H/O hypertension  P:  Telemetry monitoring  Holding ASA SCD's only with bleeding  RENAL A:   Acute Renal Failure - worsening with anuria 9/20. Hypervolemia - due to hypoalbuminemia Metabolic acidosis  Hypokalemia - resolved Hypomagnesemia - resolved. Hyponatremia   P:   Trend BMP / [place]  Nephrology following, appreciate input CVVHD per Nephrology   GASTROINTESTINAL A:   Abdominal Hemorrhage into Fluid Collections on R / Intraperitoneal Hemorrhage Chronic diarrhea with C diff colonization Hypoalbuminemia  N/V - Holding tube feedings Hx of GERD  P:   Resume TF 9/21, trickle feeding and assess how patient tolerates Protonix IV daily Zofran IV prn See ID Do not think she would be a surgical candidate.  Discussed with general surgery as well and not an operative candidate due to multiple prior belly surgeries & poor overall health  HEMATOLOGIC A:   Anemia - No signs of active bleeding on CT. S/P 2u PRBC 9/16. & 1u PRBC 9/17. Hx of CMML with chronic anemia.  P:  Trend Hgb daily w/ CBC SCDs Transfuse for Hgb <7 Minimize blood draws as able   INFECTIOUS A:    Septic shock - Secondary to Pneumonia & questionable bacteremia. Questionable Streptococcal Pneumonia - Completed Tressie Ellis 9/14 Streptococcal Viridans Bacteremia - Completed Tressie Ellis 9/14  Cdiff colonization  P:   Trend Leukocytosis Procalcitonin algorithm  Linezolid 9/17>> 9/19 Aztreonam 9/17>> Flagyl IV 9/17>>  Blood Ctx 9/17>> Urine Ctx 9/17>> Resp Ctx 9/17>>  Blood Ctx 9/13 >> negative  R  Pleural Effusion 9/13 >> negative  BAL 9/7>>Bacteria Neg/AFB pending/Fungal pending  Blood Ctx 9/5>> 1/2 positive for Strep Viridans Urine Strep Ag 9/5>>negative Urine Leg Ag 9/5>>negative  ENDOCRINE A: Hyperglycemia  P: Monitor BG & RN to notify for <90 or >160 D10 @ 50 ml/hr, continue until we can see if patient tolerates TF  NEUROLOGY A: Chest Pain post resuscitation H/O Chronic pain Depression  P: RASS goal: 0 to -1 Fentanyl IV prn   TODAYS SUMMARY:  64 year old female with atrial fibrillation and pneumonia with repeat intubation due to hypoxic respiratory failure. Patient has had failure to wean likely owing to pulmonary edema from hypoalbuminemic state. Patient suffered bradycardic arrest on 9/17 after episode of emesis. Patient had no evidence of bowel obstruction or residual tube feeds prior to this event. With elevation of Procalcitonin and no obvious source for shock otherwise in the face of leukocytosis broad-spectrum antibiotics were restarted after repeating cultures. Hgb dropped overnight 9/19.  Repeat CT abd showed bleeding into prior fluid collections. CVVHD initiated on 9/20.  Clinical prognosis remains guarded given multiple medical problems. Family very involved in patients care.       Noe Gens, NP-C [facility] Pgr: [phone] or if no answer [phone] 01/07/2015, 8:03 AM

## 2015-01-07 NOTE — Progress Notes (Signed)
   01/07/15 1400  Clinical Encounter Type  Visited With Patient and family together  Visit Type Follow-up;Spiritual support  Spiritual Encounters  Spiritual Needs Emotional  [facility] following up; pt sedated but family present; family seems content; family welcomed visit and indicated that pt will be here for awhile.  [place] will follow-up next week. 2:55 PM Gwynn Burly

## 2015-01-07 NOTE — Progress Notes (Signed)
RT placed PT on new ventilator (due to existing vent messaging without audible alarm- "Expiratory Cassette Changed"- uneventful. Settings on new ventilator were set to mirror those from removed vent. RN aware.

## 2015-01-07 NOTE — Progress Notes (Signed)
ANTIBIOTIC CONSULT NOTE - FOLLOW UP  Pharmacy Consult for Aztreonam Indication: pneumonia  Allergies  Allergen Reactions  . Vancomycin Hives and Rash    wheezing  . Ativan [Lorazepam] Other (See Comments)    "makes me crazy"  . Codeine Nausea And Vomiting  . Digoxin And Related Swelling  . Tetanus Toxoids Other (See Comments)    Serum reaction  . Penicillins Hives and Rash  . Xarelto [Rivaroxaban] Hives and Rash    (May not be allergic. Vancomycin was also being taken when reaction occurred.)    Patient Measurements: Height: 5\' 5"  (165.1 cm) Weight: 184 lb 11.9 oz (83.8 kg) IBW/kg (Calculated) : 57  Vital Signs: Temp: 97 F (36.1 C) (09/21 0800) Temp Source: Axillary (09/21 0800) BP: 89/49 mmHg (09/21 1000) Pulse Rate: 92 (09/21 0930) Intake/Output from previous day: 09/20 0701 - 09/21 0700 In: 1857.6 [I.V.:1377.6; NG/GT:30; IV Piggyback:450] Out: 3820 [Urine:86; Emesis/NG output:150; Stool:165] Intake/Output from this shift: Total I/O In: 191.4 [I.V.:191.4] Out: 953 [Other:953]  Labs:  Recent Labs  01/06/15 0128 01/06/15 0420 01/06/15 1027 01/06/15 1322 01/06/15 1634 01/06/15 1639 01/07/15 0640  WBC 5.9  --  7.4  --   --   --  10.3  HGB 6.0*  --  7.7* 7.6*  --   --  7.8*  PLT 195  --  195  --   --   --  235  LABCREA  --   --   --   --   --  57.91  --   CREATININE  --  2.87*  --   --  2.99*  --  2.24*   Estimated Creatinine Clearance: 26.8 mL/min (by C-G formula based on Cr of 2.24). No results for input(s): VANCOTROUGH, VANCOPEAK, VANCORANDOM, GENTTROUGH, GENTPEAK, GENTRANDOM, TOBRATROUGH, TOBRAPEAK, TOBRARND, AMIKACINPEAK, AMIKACINTROU, AMIKACIN in the last 72 hours.   Microbiology: Recent Results (from the past 720 hour(s))  TECHNOLOGIST REVIEW     Status: None   Collection Time: 12/11/14 10:49 AM  Result Value Ref Range Status   Technologist Review   Final    Metas and Myelocytes, 1% nrbc, mod teardrops and ovalocytes, few shistocytes present   Blood culture (routine x 2)     Status: None   Collection Time: 01/09/2015  6:32 PM  Result Value Ref Range Status   Specimen Description BLOOD LEFT ANTECUBITAL  Final   Special Requests BOTTLES DRAWN AEROBIC AND ANAEROBIC 5CC  Final   Culture  Setup Time   Final    GRAM VARIABLE COCCOBACILLI AEROBIC BOTTLE ONLY CRITICAL RESULT CALLED TO, READ BACK BY AND VERIFIED WITH: D ALDRIDGE,RN AT 1220 12/24/14 BY L BENFIELD    Culture   Final    VIRIDANS STREPTOCOCCUS THE SIGNIFICANCE OF ISOLATING THIS ORGANISM FROM A SINGLE SET OF BLOOD CULTURES WHEN MULTIPLE SETS ARE DRAWN IS UNCERTAIN. PLEASE NOTIFY THE MICROBIOLOGY DEPARTMENT WITHIN ONE WEEK IF SPECIATION AND SENSITIVITIES ARE REQUIRED. Performed at [facility]    Report Status 12/26/2014 FINAL  Final  Blood culture (routine x 2)     Status: None   Collection Time: 12/20/2014  8:55 PM  Result Value Ref Range Status   Specimen Description BLOOD LEFT HAND  Final   Special Requests BOTTLES DRAWN AEROBIC ONLY 5CC  Final   Culture   Final    NO GROWTH 5 DAYS Performed at [facility]    Report Status 12/28/2014 FINAL  Final  C difficile quick scan w PCR reflex     Status: Abnormal  Collection Time: 12/18/2014  9:08 PM  Result Value Ref Range Status   C Diff antigen POSITIVE (A) NEGATIVE Final   C Diff toxin NEGATIVE NEGATIVE Final   C Diff interpretation   Final    C. difficile present, but toxin not detected. This indicates colonization. In most cases, this does not require treatment. If patient has signs and symptoms consistent with colitis, consider treatment.    Comment: RESULT CALLED TO, READ BACK BY AND VERIFIED WITH: Q MBEMENA RN [id number] 12/21/2014 A NAVARRO   MRSA PCR Screening     Status: Abnormal   Collection Time: 01/03/2015  9:46 PM  Result Value Ref Range Status   MRSA by PCR POSITIVE (A) NEGATIVE Final    Comment:        The GeneXpert MRSA Assay (FDA approved for NASAL specimens only), is one component of  a comprehensive MRSA colonization surveillance program. It is not intended to diagnose MRSA infection nor to guide or monitor treatment for MRSA infections. RESULT CALLED TO, READ BACK BY AND VERIFIED WITH: Q MBEMENA RN [id number] 12/23/14 A NAVARRO   Culture, bal-quantitative     Status: None   Collection Time: 12/24/14 10:47 AM  Result Value Ref Range Status   Specimen Description BRONCHIAL ALVEOLAR LAVAGE  Final   Special Requests Immunocompromised  Final   Gram Stain   Final    RARE WBC PRESENT, PREDOMINANTLY MONONUCLEAR RARE SQUAMOUS EPITHELIAL CELLS PRESENT NO ORGANISMS SEEN Performed at [place] NO GROWTH Performed at [facility]   Final   Culture   Final    NO GROWTH 2 DAYS Performed at [facility]    Report Status 12/26/2014 FINAL  Final  Fungus Culture with Smear     Status: None (Preliminary result)   Collection Time: 12/24/14 10:47 AM  Result Value Ref Range Status   Specimen Description BRONCHIAL ALVEOLAR LAVAGE  Final   Special Requests Immunocompromised  Final   Fungal Smear   Final    NO YEAST OR FUNGAL ELEMENTS SEEN Performed at [facility]    Culture   Final    CANDIDA ALBICANS Performed at [facility]    Report Status PENDING  Incomplete  AFB culture with smear     Status: None (Preliminary result)   Collection Time: 12/24/14 10:47 AM  Result Value Ref Range Status   Specimen Description BRONCHIAL ALVEOLAR LAVAGE  Final   Special Requests Immunocompromised  Final   Acid Fast Smear   Final    NO ACID FAST BACILLI SEEN Performed at [facility]    Culture   Final    CULTURE WILL BE EXAMINED FOR 6 WEEKS BEFORE ISSUING A FINAL REPORT Performed at [facility]    Report Status PENDING  Incomplete  Body fluid culture     Status: None   Collection Time: 12/30/14 12:34 PM  Result Value Ref Range Status   Specimen Description PLEURAL  Final   Special Requests Normal  Final    Gram Stain   Final    MODERATE WBC PRESENT, PREDOMINANTLY MONONUCLEAR NO ORGANISMS SEEN    Culture   Final    NO GROWTH 3 DAYS Performed at [facility]    Report Status 01/02/2015 FINAL  Final  Culture, blood (routine x 2)     Status: None   Collection Time: 12/30/14  5:15 PM  Result Value Ref Range Status   Specimen Description BLOOD LEFT HAND  Final   Special Requests IN PEDIATRIC BOTTLE  2CC  Final   Culture   Final    NO GROWTH 5 DAYS Performed at [facility]    Report Status 01/04/2015 FINAL  Final  Culture, blood (routine x 2)     Status: None   Collection Time: 12/30/14  5:30 PM  Result Value Ref Range Status   Specimen Description BLOOD LEFT HAND  Final   Special Requests IN PEDIATRIC BOTTLE  .5CC  Final   Culture   Final    NO GROWTH 5 DAYS Performed at [facility]    Report Status 01/04/2015 FINAL  Final  Culture, blood (routine x 2)     Status: None (Preliminary result)   Collection Time: 01/03/15 11:20 PM  Result Value Ref Range Status   Specimen Description BLOOD LEFT HAND  Final   Special Requests IN PEDIATRIC BOTTLE  2.5 CC  Final   Culture   Final    NO GROWTH 2 DAYS Performed at [facility]    Report Status PENDING  Incomplete  Culture, blood (routine x 2)     Status: None (Preliminary result)   Collection Time: 01/03/15 11:25 PM  Result Value Ref Range Status   Specimen Description BLOOD LEFT WRIST  Final   Special Requests IN PEDIATRIC BOTTLE 2.5 CC  Final   Culture   Final    NO GROWTH 2 DAYS Performed at [facility]    Report Status PENDING  Incomplete  Culture, Urine     Status: None (Preliminary result)   Collection Time: 01/03/15 11:29 PM  Result Value Ref Range Status   Specimen Description URINE, CATHETERIZED  Final   Special Requests Normal  Final   Culture   Final    >=100,000 COLONIES/mL ENTEROCOCCUS SPECIES Performed at [facility]    Report Status PENDING  Incomplete   Organism  ID, Bacteria ENTEROCOCCUS SPECIES  Final      Susceptibility   Enterococcus species - MIC*    AMPICILLIN >=32 RESISTANT Resistant     LEVOFLOXACIN >=8 RESISTANT Resistant     NITROFURANTOIN 32 SENSITIVE Sensitive     VANCOMYCIN 1 SENSITIVE Sensitive     * >=100,000 COLONIES/mL ENTEROCOCCUS SPECIES  Culture, respiratory (NON-Expectorated)     Status: None (Preliminary result)   Collection Time: 01/04/15 12:36 AM  Result Value Ref Range Status   Specimen Description TRACHEAL ASPIRATE  Final   Special Requests Normal  Final   Gram Stain   Final    FEW WBC PRESENT, PREDOMINANTLY PMN RARE SQUAMOUS EPITHELIAL CELLS PRESENT ABUNDANT GRAM NEGATIVE COCCOBACILLI MODERATE GRAM POSITIVE COCCI IN PAIRS AND CHAINS FEW YEAST    Culture   Final    Culture reincubated for better growth Performed at [facility]    Report Status PENDING  Incomplete    Anti-infectives    Start     Dose/Rate Route Frequency Ordered Stop   01/07/15 1400  aztreonam (AZACTAM) 1 g in dextrose 5 % 50 mL IVPB     1 g 100 mL/hr over 30 Minutes Intravenous 3 times per day 01/07/15 0812     01/06/15 1400  aztreonam (AZACTAM) 500 mg in dextrose 5 % 50 mL IVPB  Status:  Discontinued     500 mg 100 mL/hr over 30 Minutes Intravenous 3 times per day 01/06/15 0739 01/07/15 0812   01/04/15 0600  aztreonam (AZACTAM) 1 g in dextrose 5 % 50 mL IVPB  Status:  Discontinued     1 g 100 mL/hr over 30 Minutes Intravenous 3 times per day 01/03/15 2255 01/06/15 0739   01/03/15 2300  linezolid (ZYVOX) IVPB 600 mg  Status:  Discontinued     600 mg 300 mL/hr over 60 Minutes Intravenous Every 12 hours 01/03/15 2248 01/05/15 1039   01/03/15 2300  metroNIDAZOLE (FLAGYL) IVPB 500 mg     500 mg 100 mL/hr over 60 Minutes Intravenous 3 times per day 01/03/15 2248     01/03/15 2300  aztreonam (AZACTAM) 2 g in dextrose 5 % 50 mL IVPB     2 g 100 mL/hr over 30 Minutes Intravenous  Once 01/03/15 2254 01/04/15 0003   12/24/14 1800   tobramycin (NEBCIN) 400 mg in dextrose 5 % 100 mL IVPB     7 mg/kg  57 kg (Ideal) 110 mL/hr over 60 Minutes Intravenous  Once 12/24/14 1710 12/24/14 1842   12/23/14 1345  cefTAZidime (FORTAZ) 2 g in dextrose 5 % 50 mL IVPB  Status:  Discontinued     2 g 100 mL/hr over 30 Minutes Intravenous Every 12 hours 12/23/14 1340 12/31/14 1044   01/06/2015 2000  linezolid (ZYVOX) IVPB 600 mg  Status:  Discontinued     600 mg 300 mL/hr over 60 Minutes Intravenous Every 12 hours 01/02/2015 1821 12/26/14 1545   12/24/2014 1845  cefTAZidime (FORTAZ) 2 g in dextrose 5 % 50 mL IVPB  Status:  Discontinued     2 g 100 mL/hr over 30 Minutes Intravenous Daily 01/04/2015 1839 12/23/14 1340   01/04/2015 1745  levofloxacin (LEVAQUIN) IVPB 750 mg  Status:  Discontinued     750 mg 100 mL/hr over 90 Minutes Intravenous  Once 01/15/2015 1744 12/28/2014 1820      Assessment: 64 yo female smoker with dyspnea, VDRF from pneumococcal PNA and septic shock. She has hx of HTN, GERD, PAF, CMML, COPD.  Experienced aspiration and bradycardic arrest on 9/17.  Now with GNR in lungs and new renal failure.  Pharmacy dosing Aztreonam.  Temp: afebrile, few low temps, prob d/t CRRT WBC: peaked at 45, but now significantly improved to WNL Renal: started on CRRT with worsening renal function; still negligible UOP PCT peaked at 29, now decreasing slowly (9/19) CXR 9/21: unchanged dense diffuse bilat infiltrates  9/5 >> Ceftazidime >> 9/14 9/5 >> Zyvox >>9/9  9/7 >> Tobramycin >> 9/8 9/17 >> aztreonam >> 9/17 >> linezolid (MD) >> 9/19 9/17 >> metronidazole (MD) >>  9/5 blood: viridans strep 1/2 9/5 Cidff Ag +, Toxin - 9/5 MRSA PCR: positive 9/5 Strep pneumo Ur Ag: negative 9/5 Legionella UAg: neg 9/7 BAL: candida albicans (not relevant per CCM); AFB pending 9/13 pleural fluid cx: NGF 9/13 blood x 2: ngtd 9/17 blood x2: ngtd 9/17 Urine: > 100k enterococcus, R- Amp, Lvq; S- NTF, Vanc 9/18 trach aspirate: multiple morphotypes  present, reincubating  Goal of Therapy:  Eradication of infection Appropriate antibiotic dosing for indication and renal function  Plan:  Day 4 antibiotics restart  Increase aztreonam back to to 1 g IV q8 hr with CRRT.  Follow clinical course, renal function, culture results as available  Follow for de-escalation of antibiotics and LOT  Spoke with NP regarding enterococcal infection. Note pt did receive 5 + 3 days of Linezolid earlier.  May very well have been cause of leukocytosis on 9/17, but will likely observe off abx for next 24 hrs as Temp/WBCs resolved.   Reuel Boom, PharmD, BCPS Pager: [phone] 01/07/2015, 10:25 AM

## 2015-01-08 ENCOUNTER — Inpatient Hospital Stay (HOSPITAL_COMMUNITY): Payer: 59

## 2015-01-08 ENCOUNTER — Other Ambulatory Visit: Payer: 59

## 2015-01-08 ENCOUNTER — Ambulatory Visit: Payer: 59

## 2015-01-08 DIAGNOSIS — E872 Acidosis: Secondary | ICD-10-CM

## 2015-01-08 DIAGNOSIS — J9602 Acute respiratory failure with hypercapnia: Secondary | ICD-10-CM | POA: Insufficient documentation

## 2015-01-08 DIAGNOSIS — J9600 Acute respiratory failure, unspecified whether with hypoxia or hypercapnia: Secondary | ICD-10-CM

## 2015-01-08 LAB — CBC WITH DIFFERENTIAL/PLATELET
BASOS PCT: 0 %
Basophils Absolute: 0 10*3/uL (ref 0.0–0.1)
EOS ABS: 0.2 10*3/uL (ref 0.0–0.7)
Eosinophils Relative: 1 %
HCT: 27.5 % — ABNORMAL LOW (ref 36.0–46.0)
Hemoglobin: 9 g/dL — ABNORMAL LOW (ref 12.0–15.0)
LYMPHS ABS: 2.5 10*3/uL (ref 0.7–4.0)
Lymphocytes Relative: 13 %
MCH: 28.4 pg (ref 26.0–34.0)
MCHC: 32.7 g/dL (ref 30.0–36.0)
MCV: 86.8 fL (ref 78.0–100.0)
MONO ABS: 6 10*3/uL — AB (ref 0.1–1.0)
Monocytes Relative: 31 %
NEUTROS PCT: 55 %
Neutro Abs: 10.8 10*3/uL — ABNORMAL HIGH (ref 1.7–7.7)
PLATELETS: 305 10*3/uL (ref 150–400)
RBC: 3.17 MIL/uL — ABNORMAL LOW (ref 3.87–5.11)
RDW: 19.2 % — AB (ref 11.5–15.5)
WBC: 19.5 10*3/uL — ABNORMAL HIGH (ref 4.0–10.5)

## 2015-01-08 LAB — POCT I-STAT, CHEM 8
BUN: 26 mg/dL — AB (ref 6–20)
BUN: 27 mg/dL — AB (ref 6–20)
BUN: 31 mg/dL — AB (ref 6–20)
BUN: 25 mg/dL — AB (ref 6–20)
BUN: 29 mg/dL — AB (ref 6–20)
BUN: 24 mg/dL — AB (ref 6–20)
BUN: 27 mg/dL — ABNORMAL HIGH (ref 6–20)
BUN: 23 mg/dL — AB (ref 6–20)
BUN: 26 mg/dL — AB (ref 6–20)
BUN: 20 mg/dL (ref 6–20)
BUN: 25 mg/dL — ABNORMAL HIGH (ref 6–20)
BUN: 19 mg/dL (ref 6–20)
BUN: 24 mg/dL — AB (ref 6–20)
BUN: 18 mg/dL (ref 6–20)
BUN: 22 mg/dL — AB (ref 6–20)
BUN: 16 mg/dL (ref 6–20)
BUN: 20 mg/dL (ref 6–20)
CALCIUM ION: 0.46 mmol/L — AB (ref 1.13–1.30)
CALCIUM ION: 0.37 mmol/L — AB (ref 1.13–1.30)
CALCIUM ION: 0.85 mmol/L — AB (ref 1.13–1.30)
CALCIUM ION: 0.42 mmol/L — AB (ref 1.13–1.30)
CALCIUM ION: 1.02 mmol/L — AB (ref 1.13–1.30)
CALCIUM ION: 0.47 mmol/L — AB (ref 1.13–1.30)
CALCIUM ION: 0.7 mmol/L — AB (ref 1.13–1.30)
CHLORIDE: 75 mmol/L — AB (ref 101–111)
CHLORIDE: 93 mmol/L — AB (ref 101–111)
CHLORIDE: 92 mmol/L — AB (ref 101–111)
CHLORIDE: 92 mmol/L — AB (ref 101–111)
CHLORIDE: 91 mmol/L — AB (ref 101–111)
CHLORIDE: 90 mmol/L — AB (ref 101–111)
CHLORIDE: 91 mmol/L — AB (ref 101–111)
CHLORIDE: 90 mmol/L — AB (ref 101–111)
CHLORIDE: 93 mmol/L — AB (ref 101–111)
CHLORIDE: 92 mmol/L — AB (ref 101–111)
CHLORIDE: 91 mmol/L — AB (ref 101–111)
CHLORIDE: 89 mmol/L — AB (ref 101–111)
CHLORIDE: 89 mmol/L — AB (ref 101–111)
CHLORIDE: 89 mmol/L — AB (ref 101–111)
CHLORIDE: 90 mmol/L — AB (ref 101–111)
CREATININE: 1.3 mg/dL — AB (ref 0.44–1.00)
CREATININE: 1.4 mg/dL — AB (ref 0.44–1.00)
CREATININE: 1.1 mg/dL — AB (ref 0.44–1.00)
CREATININE: 1.5 mg/dL — AB (ref 0.44–1.00)
CREATININE: 1.1 mg/dL — AB (ref 0.44–1.00)
CREATININE: 1.4 mg/dL — AB (ref 0.44–1.00)
CREATININE: 1.2 mg/dL — AB (ref 0.44–1.00)
CREATININE: 1 mg/dL (ref 0.44–1.00)
CREATININE: 0.9 mg/dL (ref 0.44–1.00)
CREATININE: 1.2 mg/dL — AB (ref 0.44–1.00)
CREATININE: 0.8 mg/dL (ref 0.44–1.00)
CREATININE: 1.1 mg/dL — AB (ref 0.44–1.00)
Calcium, Ion: 0.81 mmol/L — ABNORMAL LOW (ref 1.13–1.30)
Calcium, Ion: 1.96 mmol/L (ref 1.13–1.30)
Calcium, Ion: 0.42 mmol/L — CL (ref 1.13–1.30)
Calcium, Ion: 0.87 mmol/L — ABNORMAL LOW (ref 1.13–1.30)
Calcium, Ion: 0.85 mmol/L — ABNORMAL LOW (ref 1.13–1.30)
Calcium, Ion: 0.4 mmol/L — CL (ref 1.13–1.30)
Calcium, Ion: 1.04 mmol/L — ABNORMAL LOW (ref 1.13–1.30)
Calcium, Ion: 0.36 mmol/L — CL (ref 1.13–1.30)
Calcium, Ion: 1.12 mmol/L — ABNORMAL LOW (ref 1.13–1.30)
Calcium, Ion: 1.22 mmol/L (ref 1.13–1.30)
Chloride: 95 mmol/L — ABNORMAL LOW (ref 101–111)
Chloride: 93 mmol/L — ABNORMAL LOW (ref 101–111)
Creatinine, Ser: 1.3 mg/dL — ABNORMAL HIGH (ref 0.44–1.00)
Creatinine, Ser: 1 mg/dL (ref 0.44–1.00)
Creatinine, Ser: 1.3 mg/dL — ABNORMAL HIGH (ref 0.44–1.00)
Creatinine, Ser: 1.3 mg/dL — ABNORMAL HIGH (ref 0.44–1.00)
Creatinine, Ser: 0.9 mg/dL (ref 0.44–1.00)
GLUCOSE: 136 mg/dL — AB (ref 65–99)
GLUCOSE: 142 mg/dL — AB (ref 65–99)
GLUCOSE: 188 mg/dL — AB (ref 65–99)
GLUCOSE: 142 mg/dL — AB (ref 65–99)
GLUCOSE: 184 mg/dL — AB (ref 65–99)
GLUCOSE: 148 mg/dL — AB (ref 65–99)
GLUCOSE: 211 mg/dL — AB (ref 65–99)
GLUCOSE: 148 mg/dL — AB (ref 65–99)
GLUCOSE: 137 mg/dL — AB (ref 65–99)
GLUCOSE: 186 mg/dL — AB (ref 65–99)
Glucose, Bld: 173 mg/dL — ABNORMAL HIGH (ref 65–99)
Glucose, Bld: >700 mg/dL (ref 65–99)
Glucose, Bld: 177 mg/dL — ABNORMAL HIGH (ref 65–99)
Glucose, Bld: 146 mg/dL — ABNORMAL HIGH (ref 65–99)
Glucose, Bld: 218 mg/dL — ABNORMAL HIGH (ref 65–99)
Glucose, Bld: 173 mg/dL — ABNORMAL HIGH (ref 65–99)
Glucose, Bld: 115 mg/dL — ABNORMAL HIGH (ref 65–99)
HCT: 20 % — ABNORMAL LOW (ref 36.0–46.0)
HCT: 25 % — ABNORMAL LOW (ref 36.0–46.0)
HCT: 27 % — ABNORMAL LOW (ref 36.0–46.0)
HCT: 27 % — ABNORMAL LOW (ref 36.0–46.0)
HCT: 27 % — ABNORMAL LOW (ref 36.0–46.0)
HCT: 27 % — ABNORMAL LOW (ref 36.0–46.0)
HCT: 28 % — ABNORMAL LOW (ref 36.0–46.0)
HCT: 24 % — ABNORMAL LOW (ref 36.0–46.0)
HCT: 26 % — ABNORMAL LOW (ref 36.0–46.0)
HEMATOCRIT: 25 % — AB (ref 36.0–46.0)
HEMATOCRIT: 28 % — AB (ref 36.0–46.0)
HEMATOCRIT: 26 % — AB (ref 36.0–46.0)
HEMATOCRIT: 24 % — AB (ref 36.0–46.0)
HEMATOCRIT: 25 % — AB (ref 36.0–46.0)
HEMATOCRIT: 27 % — AB (ref 36.0–46.0)
HEMATOCRIT: 25 % — AB (ref 36.0–46.0)
HEMATOCRIT: 25 % — AB (ref 36.0–46.0)
HEMOGLOBIN: 8.5 g/dL — AB (ref 12.0–15.0)
HEMOGLOBIN: 8.5 g/dL — AB (ref 12.0–15.0)
HEMOGLOBIN: 8.2 g/dL — AB (ref 12.0–15.0)
HEMOGLOBIN: 9.5 g/dL — AB (ref 12.0–15.0)
Hemoglobin: 6.8 g/dL — CL (ref 12.0–15.0)
Hemoglobin: 9.2 g/dL — ABNORMAL LOW (ref 12.0–15.0)
Hemoglobin: 9.2 g/dL — ABNORMAL LOW (ref 12.0–15.0)
Hemoglobin: 9.2 g/dL — ABNORMAL LOW (ref 12.0–15.0)
Hemoglobin: 9.5 g/dL — ABNORMAL LOW (ref 12.0–15.0)
Hemoglobin: 8.8 g/dL — ABNORMAL LOW (ref 12.0–15.0)
Hemoglobin: 9.2 g/dL — ABNORMAL LOW (ref 12.0–15.0)
Hemoglobin: 8.5 g/dL — ABNORMAL LOW (ref 12.0–15.0)
Hemoglobin: 8.2 g/dL — ABNORMAL LOW (ref 12.0–15.0)
Hemoglobin: 9.2 g/dL — ABNORMAL LOW (ref 12.0–15.0)
Hemoglobin: 8.5 g/dL — ABNORMAL LOW (ref 12.0–15.0)
Hemoglobin: 8.5 g/dL — ABNORMAL LOW (ref 12.0–15.0)
Hemoglobin: 8.8 g/dL — ABNORMAL LOW (ref 12.0–15.0)
POTASSIUM: 3.5 mmol/L (ref 3.5–5.1)
POTASSIUM: 3.6 mmol/L (ref 3.5–5.1)
POTASSIUM: 3.6 mmol/L (ref 3.5–5.1)
POTASSIUM: 3.6 mmol/L (ref 3.5–5.1)
POTASSIUM: 3.5 mmol/L (ref 3.5–5.1)
POTASSIUM: 3.5 mmol/L (ref 3.5–5.1)
POTASSIUM: 3.4 mmol/L — AB (ref 3.5–5.1)
POTASSIUM: 3.4 mmol/L — AB (ref 3.5–5.1)
POTASSIUM: 3.4 mmol/L — AB (ref 3.5–5.1)
POTASSIUM: 3.4 mmol/L — AB (ref 3.5–5.1)
POTASSIUM: 3.4 mmol/L — AB (ref 3.5–5.1)
Potassium: 2.9 mmol/L — ABNORMAL LOW (ref 3.5–5.1)
Potassium: 3.6 mmol/L (ref 3.5–5.1)
Potassium: 3.7 mmol/L (ref 3.5–5.1)
Potassium: 3.5 mmol/L (ref 3.5–5.1)
Potassium: 3.3 mmol/L — ABNORMAL LOW (ref 3.5–5.1)
Potassium: 3.5 mmol/L (ref 3.5–5.1)
SODIUM: 132 mmol/L — AB (ref 135–145)
SODIUM: 134 mmol/L — AB (ref 135–145)
SODIUM: 132 mmol/L — AB (ref 135–145)
SODIUM: 134 mmol/L — AB (ref 135–145)
SODIUM: 130 mmol/L — AB (ref 135–145)
SODIUM: 133 mmol/L — AB (ref 135–145)
SODIUM: 130 mmol/L — AB (ref 135–145)
Sodium: 107 mmol/L — CL (ref 135–145)
Sodium: 133 mmol/L — ABNORMAL LOW (ref 135–145)
Sodium: 134 mmol/L — ABNORMAL LOW (ref 135–145)
Sodium: 133 mmol/L — ABNORMAL LOW (ref 135–145)
Sodium: 132 mmol/L — ABNORMAL LOW (ref 135–145)
Sodium: 130 mmol/L — ABNORMAL LOW (ref 135–145)
Sodium: 133 mmol/L — ABNORMAL LOW (ref 135–145)
Sodium: 130 mmol/L — ABNORMAL LOW (ref 135–145)
Sodium: 130 mmol/L — ABNORMAL LOW (ref 135–145)
Sodium: 131 mmol/L — ABNORMAL LOW (ref 135–145)
TCO2: 19 mmol/L (ref 0–100)
TCO2: 21 mmol/L (ref 0–100)
TCO2: 22 mmol/L (ref 0–100)
TCO2: 23 mmol/L (ref 0–100)
TCO2: 23 mmol/L (ref 0–100)
TCO2: 22 mmol/L (ref 0–100)
TCO2: 23 mmol/L (ref 0–100)
TCO2: 23 mmol/L (ref 0–100)
TCO2: 23 mmol/L (ref 0–100)
TCO2: 23 mmol/L (ref 0–100)
TCO2: 25 mmol/L (ref 0–100)
TCO2: 24 mmol/L (ref 0–100)
TCO2: 26 mmol/L (ref 0–100)
TCO2: 24 mmol/L (ref 0–100)
TCO2: 26 mmol/L (ref 0–100)
TCO2: 24 mmol/L (ref 0–100)
TCO2: 26 mmol/L (ref 0–100)

## 2015-01-08 LAB — RENAL FUNCTION PANEL
ANION GAP: 10 (ref 5–15)
ANION GAP: 16 — AB (ref 5–15)
Albumin: 1.8 g/dL — ABNORMAL LOW (ref 3.5–5.0)
Albumin: 1.7 g/dL — ABNORMAL LOW (ref 3.5–5.0)
Albumin: 1.8 g/dL — ABNORMAL LOW (ref 3.5–5.0)
Anion gap: 12 (ref 5–15)
BUN: 32 mg/dL — AB (ref 6–20)
BUN: 24 mg/dL — ABNORMAL HIGH (ref 6–20)
BUN: 24 mg/dL — ABNORMAL HIGH (ref 6–20)
CALCIUM: 8 mg/dL — AB (ref 8.9–10.3)
CHLORIDE: 96 mmol/L — AB (ref 101–111)
CHLORIDE: 89 mmol/L — AB (ref 101–111)
CHLORIDE: 94 mmol/L — AB (ref 101–111)
CO2: 26 mmol/L (ref 22–32)
CO2: 28 mmol/L (ref 22–32)
CO2: 21 mmol/L — AB (ref 22–32)
CREATININE: 1.32 mg/dL — AB (ref 0.44–1.00)
Calcium: 14.5 mg/dL (ref 8.9–10.3)
Calcium: 9.9 mg/dL (ref 8.9–10.3)
Creatinine, Ser: 1.06 mg/dL — ABNORMAL HIGH (ref 0.44–1.00)
Creatinine, Ser: 1.15 mg/dL — ABNORMAL HIGH (ref 0.44–1.00)
GFR calc Af Amer: 48 mL/min — ABNORMAL LOW (ref >60)
GFR calc non Af Amer: 41 mL/min — ABNORMAL LOW (ref >60)
GFR calc non Af Amer: 49 mL/min — ABNORMAL LOW (ref >60)
GFR, EST AFRICAN AMERICAN: 57 mL/min — AB (ref >60)
GFR, EST NON AFRICAN AMERICAN: 54 mL/min — AB (ref >60)
GLUCOSE: 146 mg/dL — AB (ref 65–99)
Glucose, Bld: 284 mg/dL — ABNORMAL HIGH (ref 65–99)
Glucose, Bld: 106 mg/dL — ABNORMAL HIGH (ref 65–99)
POTASSIUM: 4.3 mmol/L (ref 3.5–5.1)
Phosphorus: 2.9 mg/dL (ref 2.5–4.6)
Phosphorus: 2.5 mg/dL (ref 2.5–4.6)
Phosphorus: 2.8 mg/dL (ref 2.5–4.6)
Potassium: 3.7 mmol/L (ref 3.5–5.1)
Potassium: 3.5 mmol/L (ref 3.5–5.1)
SODIUM: 134 mmol/L — AB (ref 135–145)
SODIUM: 131 mmol/L — AB (ref 135–145)
Sodium: 127 mmol/L — ABNORMAL LOW (ref 135–145)

## 2015-01-08 LAB — APTT: aPTT: <20 seconds — ABNORMAL LOW (ref 24–37)

## 2015-01-08 LAB — MAGNESIUM: Magnesium: 1.9 mg/dL (ref 1.7–2.4)

## 2015-01-08 MED ORDER — FENTANYL CITRATE (PF) 100 MCG/2ML IJ SOLN
200.0000 ug | Freq: Once | INTRAMUSCULAR | Status: AC
  Administered 2015-01-08: 200 ug via INTRAVENOUS
  Filled 2015-01-08: qty 4

## 2015-01-08 MED ORDER — PHENYLEPHRINE HCL 10 MG/ML IJ SOLN
30.0000 ug/min | INTRAVENOUS | Status: DC
  Administered 2015-01-08: 50 ug/min via INTRAVENOUS
  Administered 2015-01-08: 45 ug/min via INTRAVENOUS
  Administered 2015-01-09: 60 ug/min via INTRAVENOUS
  Filled 2015-01-08 (×4): qty 1

## 2015-01-08 MED ORDER — MIDAZOLAM HCL 2 MG/2ML IJ SOLN
4.0000 mg | Freq: Once | INTRAMUSCULAR | Status: AC
  Administered 2015-01-08: 4 mg via INTRAVENOUS
  Filled 2015-01-08: qty 4

## 2015-01-08 MED ORDER — ETOMIDATE 2 MG/ML IV SOLN
40.0000 mg | Freq: Once | INTRAVENOUS | Status: DC
  Filled 2015-01-08: qty 20

## 2015-01-08 MED ORDER — NEPRO/CARBSTEADY PO LIQD
1000.0000 mL | ORAL | Status: DC
  Administered 2015-01-08: 1000 mL via ORAL
  Filled 2015-01-08 (×2): qty 1000

## 2015-01-08 MED ORDER — PROPOFOL 500 MG/50ML IV EMUL
5.0000 ug/kg/min | INTRAVENOUS | Status: DC
  Filled 2015-01-08: qty 50

## 2015-01-08 MED ORDER — FLUCONAZOLE IN SODIUM CHLORIDE 400-0.9 MG/200ML-% IV SOLN
400.0000 mg | Freq: Every day | INTRAVENOUS | Status: DC
  Administered 2015-01-08 – 2015-01-11 (×4): 400 mg via INTRAVENOUS
  Filled 2015-01-08 (×5): qty 200

## 2015-01-08 MED ORDER — LINEZOLID 600 MG/300ML IV SOLN
600.0000 mg | Freq: Two times a day (BID) | INTRAVENOUS | Status: DC
  Administered 2015-01-08 – 2015-01-11 (×8): 600 mg via INTRAVENOUS
  Filled 2015-01-08 (×9): qty 300

## 2015-01-08 MED ORDER — VECURONIUM BROMIDE 10 MG IV SOLR: 10.0000 mg | Freq: Once | INTRAVENOUS | Status: DC

## 2015-01-08 NOTE — Progress Notes (Signed)
[facility] Progress Note   Subjective: net negative 3L yesterday w CRRT, wt down 81kg. CXR pending, getting trach now  Filed Vitals:   01/08/15 0600 01/08/15 0700 01/08/15 0747 01/08/15 0800  BP: 92/67 125/84    Pulse: 94 100  96  Temp:      TempSrc:      Resp: 20 21  20   Height:      Weight:      SpO2: 96% 96% 96% 96%   Exam: Small framed WF on vent, responsive No rash, cyanosis or gangrene No JVD Chest scattered rhonchi RRR no MRG Abd soft ntnd large mid-abd scar healed over, RLQ ostomy w dark brown contents in bag 2+ LE edema bilat Neuro responds to voice   Assessment: 1 Acute kidney injury - ATN due to shock/ cardiac arrest. Remains anuric.  D# 3 CRRT. Removing fluid 200-300 cc/ hr , stable neo gtt at 40 ug/min. Continue CRRT, max UF as tolerated.  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)   Plan - cont CRRT w citrate AC, max UF as tolerated    Kelly Splinter MD  pager [phone]    cell [phone]  01/08/2015, 8:37 AM     Recent Labs Lab 01/07/15 0640  01/07/15 1644  01/08/15 0525 01/08/15 0705 01/08/15 0714  NA 128*  < > 133*  < > 134* 134* 132*  K 4.4  < > 4.0  < > 3.7 3.5 3.5  CL 96*  < > 98*  < > 96* 90* 91*  CO2 20*  --  23  --  26  --   --   GLUCOSE 108*  < > 144*  < > 146* 184* 146*  BUN 62*  < > 47*  < > 32* 23* 26*  CREATININE 2.24*  < > 1.84*  < > 1.32* 1.00 1.20*  CALCIUM 7.4*  --  7.4*  --  8.0*  --   --   PHOS 6.2*  --  4.6  --  2.9  --   --   < > = values in this interval not displayed.  Recent Labs Lab 01/03/15 1020  01/07/15 0640 01/07/15 1644 01/08/15 0525  AST 30  --   --   --   --   ALT 13*  --   --   --   --   ALKPHOS 163*  --   --   --   --   BILITOT 0.8  --   --   --   --    PROT 4.7*  --   --   --   --   ALBUMIN 2.2*  < > 1.7* 1.7* 1.8*  < > = values in this interval not displayed.  Recent Labs Lab 01/06/15 1027  01/07/15 0640  01/08/15 0525 01/08/15 0705 01/08/15 0714  WBC 7.4  --  10.3  --  19.5*  --   --   NEUTROABS 5.3  --  6.8  --  10.8*  --   --   HGB 7.7*  < > 7.8*  < > 9.0* 9.2* 8.8*  HCT 22.4*  < > 23.2*  < > 27.5* 27.0* 26.0*  MCV 85.2  --  85.3  --  86.8  --   --  PLT 195  --  235  --  305  --   --   < > = values in this interval not displayed. Marland Kitchen alteplase  2 mg Intracatheter Once  . antiseptic oral rinse  7 mL Mouth Rinse QID  . arformoterol  15 mcg Nebulization BID  . aztreonam  1 g Intravenous 3 times per day  . budesonide (PULMICORT) nebulizer solution  0.5 mg Nebulization BID  . chlorhexidine gluconate  15 mL Mouth Rinse BID  . etomidate  40 mg Intravenous Once  . feeding supplement (NEPRO CARB STEADY)  1,000 mL Oral Q24H  . feeding supplement (PRO-STAT SUGAR FREE 64)  30 mL Per Tube BID  . fentaNYL (SUBLIMAZE) injection  200 mcg Intravenous Once  . metronidazole  500 mg Intravenous 3 times per day  . midazolam  4 mg Intravenous Once  . pantoprazole (PROTONIX) IV  40 mg Intravenous Q24H  . promethazine  6.25 mg Intravenous Once  . sodium chloride  10-40 mL Intracatheter Q12H  . vecuronium  10 mg Intravenous Once   . calcium gluconate infusion for CRRT 20 g (01/08/15 0750)  . dextrose 50 mL/hr at 01/08/15 0800  . phenylephrine (NEO-SYNEPHRINE) Adult infusion    . dialysis replacement fluid (prismasate) Stopped (01/07/15 1445)  . dialysis replacement fluid (prismasate) 200 mL/hr at 01/07/15 1445  . dialysate (PRISMASATE) 1,800 mL/hr at 01/08/15 0249  . propofol (DIPRIVAN) infusion 30.032 mcg/kg/min (01/08/15 0700)  . propofol    . sodium citrate 2 %/dextrose 2.5% solution 3000 mL 250 mL/hr at 01/08/15 0818   albuterol, albuterol, alteplase, fentaNYL (SUBLIMAZE) injection, heparin, metoprolol, [DISCONTINUED] ondansetron  **OR** ondansetron (ZOFRAN) IV, sodium chloride, sodium chloride

## 2015-01-08 NOTE — Consult Note (Addendum)
WOC ostomy follow-up consult note Stoma type/location: RLQ Ileostomy from several years ago.  Pt is critically ill at this time. Stomal assessment/size: 2 " slightly oval double-barreled, red and viable, flush with skin level.  Peristomal assessment: Scarring nearby from previous abdominal surgeries.  Treatment options for stomal/peristomal skin: 1 piece convex pouch used.  Output Small amt liquid light brown stool Ostomy pouching: 1 pc. with barrier ring to maintain seal.  Extra supplies at bedside for staff nurse use. Please re-consult if further assistance is needed.  Thank-you,  Julien Girt MSN, [place], [place], [place], [place]

## 2015-01-08 NOTE — Progress Notes (Signed)
Patient unable yo tolerate CPT. Patient became hypotensive and was grimising. Will try agine during the 0000 round

## 2015-01-08 NOTE — Op Note (Signed)
PCCM Video Bronchoscopy Procedure Note  The patient was informed of the risks (including but not limited to bleeding, infection, respiratory failure, lung injury, tooth/oral injury) and benefits of the procedure and gave consent, see chart.  Indication: tracheostomy placement  Post Procedure Diagnosis: Respiratory failure, thick airway secretions, successful tracheostomy placement  Location: [facility] ICU, room 1226  Condition pre procedure: Critically ill, on vent  Medications for procedure: Versed 6mg  IV, Fentanyl 313mcg IV, Propofol infusion  Procedure description: The bronchoscope was introduced through the endotracheal tube and passed to the bilateral lungs to the level of the subsegmental bronchi throughout the tracheobronchial tree.  Airway exam revealed thick secretions in the trachea to the carina.  Successful guidance of the tracheostomy performed by Dr. Titus Mould  Procedures performed: Therapeutic aspiration of the airways  Specimens sent: respiratory culture  Condition post procedure: critically ill, on vent  EBL: minimal  Complications: none  Roselie Awkward, MD [place] [facility] Pager: [phone] Cell: [phone] After 3pm or if no response, call [phone]

## 2015-01-08 NOTE — Progress Notes (Signed)
Noted urine culture with yeast and enterococcus.  Yeast also in sputum.     Plan: Resume zyvox 600 mg Q12 Rx with diflucan for yeast UTI   Noe Gens, NP-C [facility] Pgr: [phone] or if no answer [phone] 01/08/2015, 10:48 AM

## 2015-01-08 NOTE — Procedures (Signed)
Name:  [name] MRN:  [id number] DOB:  1950-12-02  OPERATIVE NOTE  Procedure:  Percutaneous tracheostomy.  Indications:  Ventilator-dependent respiratory failure, PNA  Consent:  Procedure, alternatives, risks and benefits discussed with medical POA.  Questions answered.  Consent obtained.  Anesthesia:  Versed, fentanyl, prop drip  Procedure summary:  Appropriate equipment was assembled.  The patient was identified as Acupuncturist was performed. The patient was placed in supine position with a towel roll behind shoulder blades and neck extended.  Sterile technique was used. The patient's neck and upper chest were prepped using chlorhexidine / alcohol scrub and the field was draped in usual sterile fashion with full body drape. After the adequate sedation / anesthesia was achieved, attention was directed at the midline trachea, where the cricothyroid membrane was palpated. Approximately two fingerbreadths above the sternal notch, a verticle 1 cm  incision was created with a scalpel after local infiltration with 0.2% Lidocaine. Then, using Seldinger technique and a percutaneous tracheostomy set, the trachea was entered with a 14 gauge needle with an overlying sheath. This was all confirmed under direct visualization of a fiberoptic flexible bronchoscope. Entrance into the trachea was identified through the third tracheal ring interspace. Following this, a guidewire was inserted. The needle was removed, leaving the sheath and the guidewire intact. Next, the sheath was removed and a small dilator was inserted. The tracheal rings were then dilated. A #6 Shiley was then opened. The balloon was checked. It was placed over a tracheal dilator, which was then advanced over the guidewire and through the previously dilated tract. The Shiley tracheostomy tube was noted to pass in the trachea with little resistance. The guidewire and dilator tubes were removed from the trachea. An inner cannula  was placed through the tracheostomy tube. The tracheostomy was then secured at the anterior neck with 4 monofilament sutures. The oral endotracheal tube was removed and the ventilator was attached to the newly placed tracheostomy tube. Adequate tidal volumes were noted. The cuff was inflated and no evidence of air leak was noted. No evidence of bleeding was noted. At this point, the procedure was concluded. Post-procedure chest x-ray was ordered.  Complications:  No immediate complications were noted.  Hemodynamic parameters and oxygenation remained stable throughout the procedure. I updated family  Estimated blood loss:  Less then 1 mL.  Raylene Miyamoto., MD Pulmonary and [place] Pager: [phone]  01/08/2015, 2:44 PM   Patient can follow p in our trach clinic [phone]

## 2015-01-08 NOTE — Progress Notes (Signed)
Date:  Sept. 22, 2016 U.R. performed for needs and level of care. On iv pressors and vent. Will continue to follow for Case Management needs.  [name], RN, BSN, [place]   [phone]

## 2015-01-08 NOTE — Progress Notes (Signed)
An additional dose of Fentanyl 136mcg and Versed 2mg  was given during trach procedure. Verbal order given from Dr. Lake Bells. Medication did not show up in [facility].

## 2015-01-08 NOTE — Progress Notes (Addendum)
ANTIBIOTIC CONSULT NOTE - FOLLOW UP  Pharmacy Consult for Aztreonam, Fluconazole Indication: pneumonia, candidal UTI  Allergies  Allergen Reactions  . Vancomycin Hives and Rash    wheezing  . Ativan [Lorazepam] Other (See Comments)    "makes me crazy"  . Codeine Nausea And Vomiting  . Digoxin And Related Swelling  . Tetanus Toxoids Other (See Comments)    Serum reaction  . Penicillins Hives and Rash  . Xarelto [Rivaroxaban] Hives and Rash    (May not be allergic. Vancomycin was also being taken when reaction occurred.)    Patient Measurements: Height: 5\' 5"  (165.1 cm) Weight: 178 lb 9.2 oz (81 kg) IBW/kg (Calculated) : 57  Vital Signs: Temp: 97.8 F (36.6 C) (09/22 1200) Temp Source: Axillary (09/22 1200) BP: 109/71 mmHg (09/22 1500) Pulse Rate: 96 (09/22 1500) Intake/Output from previous day: 09/21 0701 - 09/22 0700 In: 5111.5 [P.O.:30; I.V.:4431.5; NG/GT:200; IV Piggyback:450] Out: 8019 [Urine:52; Stool:200] Intake/Output from this shift: Total I/O In: 3073.4 [I.V.:2279.9; JQBHA:193; NG/GT:23.5; IV XTKWIOXBD:532] Out: 3819 [Urine:66; DJMEQ:6834; Stool:130]  Labs:  Recent Labs  01/06/15 1027  01/06/15 1639 01/07/15 0640  01/08/15 0525  01/08/15 0947 01/08/15 1123 01/08/15 1130  WBC 7.4  --   --  10.3  --  19.5*  --   --   --   --   HGB 7.7*  < >  --  7.8*  < > 9.0*  < > 8.2* 9.5* 8.2*  PLT 195  --   --  235  --  305  --   --   --   --   LABCREA  --   --  57.91  --   --   --   --   --   --   --   CREATININE  --   < >  --  2.24*  < > 1.32*  < > 1.30* 0.90 1.30*  < > = values in this interval not displayed. Estimated Creatinine Clearance: 45.4 mL/min (by C-G formula based on Cr of 1.3). No results for input(s): VANCOTROUGH, VANCOPEAK, VANCORANDOM, GENTTROUGH, GENTPEAK, GENTRANDOM, TOBRATROUGH, TOBRAPEAK, TOBRARND, AMIKACINPEAK, AMIKACINTROU, AMIKACIN in the last 72 hours.   Microbiology: Recent Results (from the past 720 hour(s))  TECHNOLOGIST REVIEW      Status: None   Collection Time: 12/11/14 10:49 AM  Result Value Ref Range Status   Technologist Review   Final    Metas and Myelocytes, 1% nrbc, mod teardrops and ovalocytes, few shistocytes present  Blood culture (routine x 2)     Status: None   Collection Time: 12/28/2014  6:32 PM  Result Value Ref Range Status   Specimen Description BLOOD LEFT ANTECUBITAL  Final   Special Requests BOTTLES DRAWN AEROBIC AND ANAEROBIC 5CC  Final   Culture  Setup Time   Final    GRAM VARIABLE COCCOBACILLI AEROBIC BOTTLE ONLY CRITICAL RESULT CALLED TO, READ BACK BY AND VERIFIED WITH: D ALDRIDGE,RN AT 1220 12/24/14 BY L BENFIELD    Culture   Final    VIRIDANS STREPTOCOCCUS THE SIGNIFICANCE OF ISOLATING THIS ORGANISM FROM A SINGLE SET OF BLOOD CULTURES WHEN MULTIPLE SETS ARE DRAWN IS UNCERTAIN. PLEASE NOTIFY THE MICROBIOLOGY DEPARTMENT WITHIN ONE WEEK IF SPECIATION AND SENSITIVITIES ARE REQUIRED. Performed at [facility]    Report Status 12/26/2014 FINAL  Final  Blood culture (routine x 2)     Status: None   Collection Time: 12/21/2014  8:55 PM  Result Value Ref Range Status   Specimen Description BLOOD  LEFT HAND  Final   Special Requests BOTTLES DRAWN AEROBIC ONLY 5CC  Final   Culture   Final    NO GROWTH 5 DAYS Performed at [facility]    Report Status 12/28/2014 FINAL  Final  C difficile quick scan w PCR reflex     Status: Abnormal   Collection Time: 01/15/2015  9:08 PM  Result Value Ref Range Status   C Diff antigen POSITIVE (A) NEGATIVE Final   C Diff toxin NEGATIVE NEGATIVE Final   C Diff interpretation   Final    C. difficile present, but toxin not detected. This indicates colonization. In most cases, this does not require treatment. If patient has signs and symptoms consistent with colitis, consider treatment.    Comment: RESULT CALLED TO, READ BACK BY AND VERIFIED WITH: Q MBEMENA RN [id number] 12/28/2014 A NAVARRO   MRSA PCR Screening     Status: Abnormal   Collection Time: 12/21/2014   9:46 PM  Result Value Ref Range Status   MRSA by PCR POSITIVE (A) NEGATIVE Final    Comment:        The GeneXpert MRSA Assay (FDA approved for NASAL specimens only), is one component of a comprehensive MRSA colonization surveillance program. It is not intended to diagnose MRSA infection nor to guide or monitor treatment for MRSA infections. RESULT CALLED TO, READ BACK BY AND VERIFIED WITH: Q MBEMENA RN [id number] 12/23/14 A NAVARRO   Culture, bal-quantitative     Status: None   Collection Time: 12/24/14 10:47 AM  Result Value Ref Range Status   Specimen Description BRONCHIAL ALVEOLAR LAVAGE  Final   Special Requests Immunocompromised  Final   Gram Stain   Final    RARE WBC PRESENT, PREDOMINANTLY MONONUCLEAR RARE SQUAMOUS EPITHELIAL CELLS PRESENT NO ORGANISMS SEEN Performed at [facility] Count NO GROWTH Performed at [facility]   Final   Culture   Final    NO GROWTH 2 DAYS Performed at [facility]    Report Status 12/26/2014 FINAL  Final  Fungus Culture with Smear     Status: None (Preliminary result)   Collection Time: 12/24/14 10:47 AM  Result Value Ref Range Status   Specimen Description BRONCHIAL ALVEOLAR LAVAGE  Final   Special Requests Immunocompromised  Final   Fungal Smear   Final    NO YEAST OR FUNGAL ELEMENTS SEEN Performed at [facility]    Culture   Final    CANDIDA ALBICANS Performed at [facility]    Report Status PENDING  Incomplete  AFB culture with smear     Status: None (Preliminary result)   Collection Time: 12/24/14 10:47 AM  Result Value Ref Range Status   Specimen Description BRONCHIAL ALVEOLAR LAVAGE  Final   Special Requests Immunocompromised  Final   Acid Fast Smear   Final    NO ACID FAST BACILLI SEEN Performed at [facility]    Culture   Final    CULTURE WILL BE EXAMINED FOR 6 WEEKS BEFORE ISSUING A FINAL REPORT Performed at [facility]    Report Status PENDING   Incomplete  Body fluid culture     Status: None   Collection Time: 12/30/14 12:34 PM  Result Value Ref Range Status   Specimen Description PLEURAL  Final   Special Requests Normal  Final   Gram Stain   Final    MODERATE WBC PRESENT, PREDOMINANTLY MONONUCLEAR NO ORGANISMS SEEN  Culture   Final    NO GROWTH 3 DAYS Performed at [facility]    Report Status 01/02/2015 FINAL  Final  Culture, blood (routine x 2)     Status: None   Collection Time: 12/30/14  5:15 PM  Result Value Ref Range Status   Specimen Description BLOOD LEFT HAND  Final   Special Requests IN PEDIATRIC BOTTLE  [facility]  Final   Culture   Final    NO GROWTH 5 DAYS Performed at [facility]    Report Status 01/04/2015 FINAL  Final  Culture, blood (routine x 2)     Status: None   Collection Time: 12/30/14  5:30 PM  Result Value Ref Range Status   Specimen Description BLOOD LEFT HAND  Final   Special Requests IN PEDIATRIC BOTTLE  .5CC  Final   Culture   Final    NO GROWTH 5 DAYS Performed at [facility]    Report Status 01/04/2015 FINAL  Final  Culture, blood (routine x 2)     Status: None (Preliminary result)   Collection Time: 01/03/15 11:20 PM  Result Value Ref Range Status   Specimen Description BLOOD LEFT HAND  Final   Special Requests IN PEDIATRIC BOTTLE  2.5 CC  Final   Culture   Final    NO GROWTH 4 DAYS Performed at [facility]    Report Status PENDING  Incomplete  Culture, blood (routine x 2)     Status: None (Preliminary result)   Collection Time: 01/03/15 11:25 PM  Result Value Ref Range Status   Specimen Description BLOOD LEFT WRIST  Final   Special Requests IN PEDIATRIC BOTTLE 2.5 CC  Final   Culture   Final    NO GROWTH 4 DAYS Performed at [facility]    Report Status PENDING  Incomplete  Culture, Urine     Status: None   Collection Time: 01/03/15 11:29 PM  Result Value Ref Range Status   Specimen Description URINE, CATHETERIZED  Final   Special  Requests Normal  Final   Culture   Final    >=100,000 COLONIES/mL ENTEROCOCCUS SPECIES >=100,000 COLONIES/mL YEAST Performed at [facility]    Report Status 01/07/2015 FINAL  Final   Organism ID, Bacteria ENTEROCOCCUS SPECIES  Final      Susceptibility   Enterococcus species - MIC*    AMPICILLIN >=32 RESISTANT Resistant     LEVOFLOXACIN >=8 RESISTANT Resistant     NITROFURANTOIN 32 SENSITIVE Sensitive     VANCOMYCIN 1 SENSITIVE Sensitive     * >=100,000 COLONIES/mL ENTEROCOCCUS SPECIES  Culture, respiratory (NON-Expectorated)     Status: None (Preliminary result)   Collection Time: 01/04/15 12:36 AM  Result Value Ref Range Status   Specimen Description TRACHEAL ASPIRATE  Final   Special Requests Normal  Final   Gram Stain   Final    FEW WBC PRESENT, PREDOMINANTLY PMN RARE SQUAMOUS EPITHELIAL CELLS PRESENT ABUNDANT GRAM NEGATIVE COCCOBACILLI MODERATE GRAM POSITIVE COCCI IN PAIRS AND CHAINS FEW YEAST    Culture   Final    ENTEROBACTER CLOACAE Performed at [facility]    Report Status PENDING  Incomplete   Organism ID, Bacteria ENTEROBACTER CLOACAE  Final      Susceptibility   Enterobacter cloacae - MIC*    CEFAZOLIN >=64 RESISTANT Resistant     CEFEPIME <=1 SENSITIVE Sensitive     CEFTAZIDIME >=64 RESISTANT Resistant     CEFTRIAXONE >=64 RESISTANT Resistant  CIPROFLOXACIN <=0.25 SENSITIVE Sensitive     GENTAMICIN <=1 SENSITIVE Sensitive     IMIPENEM 1 SENSITIVE Sensitive     PIP/TAZO >=128 RESISTANT Resistant     TOBRAMYCIN <=1 SENSITIVE Sensitive     TRIMETH/SULFA <=20 SENSITIVE Sensitive     * ENTEROBACTER CLOACAE    Anti-infectives    Start     Dose/Rate Route Frequency Ordered Stop   01/08/15 1315  fluconazole (DIFLUCAN) IVPB 400 mg     400 mg 100 mL/hr over 120 Minutes Intravenous Daily 01/08/15 1256     01/08/15 1200  linezolid (ZYVOX) IVPB 600 mg     600 mg 300 mL/hr over 60 Minutes Intravenous Every 12 hours 01/08/15 1051     01/07/15  1400  aztreonam (AZACTAM) 1 g in dextrose 5 % 50 mL IVPB     1 g 100 mL/hr over 30 Minutes Intravenous 3 times per day 01/07/15 0812     01/06/15 1400  aztreonam (AZACTAM) 500 mg in dextrose 5 % 50 mL IVPB  Status:  Discontinued     500 mg 100 mL/hr over 30 Minutes Intravenous 3 times per day 01/06/15 0739 01/07/15 0812   01/04/15 0600  aztreonam (AZACTAM) 1 g in dextrose 5 % 50 mL IVPB  Status:  Discontinued     1 g 100 mL/hr over 30 Minutes Intravenous 3 times per day 01/03/15 2255 01/06/15 0739   01/03/15 2300  linezolid (ZYVOX) IVPB 600 mg  Status:  Discontinued     600 mg 300 mL/hr over 60 Minutes Intravenous Every 12 hours 01/03/15 2248 01/05/15 1039   01/03/15 2300  metroNIDAZOLE (FLAGYL) IVPB 500 mg     500 mg 100 mL/hr over 60 Minutes Intravenous 3 times per day 01/03/15 2248     01/03/15 2300  aztreonam (AZACTAM) 2 g in dextrose 5 % 50 mL IVPB     2 g 100 mL/hr over 30 Minutes Intravenous  Once 01/03/15 2254 01/04/15 0003   12/24/14 1800  tobramycin (NEBCIN) 400 mg in dextrose 5 % 100 mL IVPB     7 mg/kg  57 kg (Ideal) 110 mL/hr over 60 Minutes Intravenous  Once 12/24/14 1710 12/24/14 1842   12/23/14 1345  cefTAZidime (FORTAZ) 2 g in dextrose 5 % 50 mL IVPB  Status:  Discontinued     2 g 100 mL/hr over 30 Minutes Intravenous Every 12 hours 12/23/14 1340 12/31/14 1044   12/29/2014 2000  linezolid (ZYVOX) IVPB 600 mg  Status:  Discontinued     600 mg 300 mL/hr over 60 Minutes Intravenous Every 12 hours 12/28/2014 1821 12/26/14 1545   12/19/2014 1845  cefTAZidime (FORTAZ) 2 g in dextrose 5 % 50 mL IVPB  Status:  Discontinued     2 g 100 mL/hr over 30 Minutes Intravenous Daily 12/21/2014 1839 12/23/14 1340   12/30/2014 1745  levofloxacin (LEVAQUIN) IVPB 750 mg  Status:  Discontinued     750 mg 100 mL/hr over 90 Minutes Intravenous  Once 12/21/2014 1744 01/06/2015 1820      Assessment: 32 yoF on CRRT (CVVHDF) known from previous aztreonam dosing.  Now with yeast (and enterococcus) in  urine.  Recovered from WBC spike after cardiac arrest; now trending up again. CCM restarting Zyvox; pharmacy to dose fluconazole  emp: afebrile, few low temps, prob d/t CRRT WBC: Spike to 45 on 9/17; resolved.  Now trending back up (did get solumedrol x3 2 days ago) Renal: started on CRRT with worsening renal function; still negligible UOP  CXR 9/21: unchanged dense diffuse bilat infiltrates  9/5 >> Ceftazidime >> 9/14 9/5 >> Zyvox >> 9/9; restart 9/17 >> 9/19; restart 9/22 >> 9/7 >> Tobramycin >> 9/8 9/17 >> aztreonam >> 9/17 >> linezolid (MD) >> 9/19 9/17 >> metronidazole (MD) >> 9/22 >> fluconazole >>  9/5 blood: viridans strep 1/2 9/5 Cidff Ag +, Toxin - 9/5 MRSA PCR: positive 9/5 Strep pneumo Ur Ag: negative 9/5 Legionella UAg: neg 9/7 BAL: candida albicans (not relevant per CCM); AFB pending 9/13 pleural fluid cx: NGF 9/13 blood x 2: ngtd 9/17 blood x2:  ngtd 9/17 Urine: > 100k enterococcus, R- Amp, Lvq; S- NTF, Vanc; >100k yeast 9/18 trach aspirate: Enterobacter, sens pending 9/22 trach aspirate: IP   Goal of Therapy:  Eradication of infection Appropriate antibiotic dosing for indication and renal function  Plan:  Day 6 antibiotics restart  Fluconazole 400 mg IV q24 hr while on CRRT  Continue aztreonam at 1 g IV q8 hr with CRRT.  Follow clinical course, renal function, culture results as available  Follow for de-escalation of antibiotics and LOT   Reuel Boom, PharmD, BCPS Pager: [phone] 01/08/2015, 3:22 PM

## 2015-01-08 NOTE — Progress Notes (Signed)
PULMONARY / CRITICAL CARE MEDICINE   Name: [name] MRN: [id number] DOB: [birth year]    ADMISSION DATE:  01/07/2015 CONSULTATION DATE:  12/23/2014  REFERRING MD :  Dr. Karleen Hampshire   CHIEF COMPLAINT:  Shortness of breath   INITIAL PRESENTATION:  64 yo female smoker with dyspnea, VDRF from pneumococcal PNA and septic shock.  She has hx of HTN, GERD, PAF, CMML, COPD.  Prolonged hospital course complicated by respiratory failure in the setting of PNA initially, then pulmonary edema w AF RVR, failed extubation, bradycardic arrest, anemia, abdominal pain with spontaneous bleeding into fluid collections / intraperitoneal hemorrhage on CT abd and acute renal failure requiring CVVHD.   STUDIES:  9/05  CT Chest w/o >> diffuse bilateral airspace disease, consolidation involving all lobes of L/R lung, several discrete nodules present in upper lobes, bulky mediastinal adenopathy 9/16  CT Abd/Pelvis >> fluid collections noted along the R anterior / lateral abdominal wall extending from the RUQ to the R lower lateral abdomen above the iliac crest - ? Seromas from prior hemorrhage with hx of anemia, ? Infected fluid collections, measurement shows these collections to be the density of water.  Anasarca with ascites, small pleural effusions, atx in lower lobes, partial atx of RML, mild splenomegaly 9/17 TTE >> LV normal in size. Moderate concentric LVH. Appearance consistent with hypertrophic cardiomyopathy. EF 80-85%. No regional wall motion abnormalities. Grade 1 diastolic dysfunction. Dynamic obstruction. No significant change from prior echocardiogram except for increased EF. No pericardial effusion. IVC normal in size. Pulmonary artery systolic pressure normal. RV normal in size and function. 9/20  CT Abd/Pelvis >> interval hemorrhage into previously described large fluid collections within the right anterior / lateral abdominal wall, diffuse anasarca, moderate ascites, pulmonary consolidation within the bilateral  lower lobes with adjacent small effusions, mild splenomegaly  SIGNIFICANT EVENTS: 9/05  Admit with worsening SOB  9/06  intubated for hypoxia while receiving fluid bolus ? Edema, levo gtt 9/10  Extubated, transfuse 1 unit PRBC 9/11  A fib with RVR >> started amiodarone/levophed, respiratory failure >> re-intubated 9/12  mottled (hx of Raynauds) ECHO pending. Appropriate. Scr bumped.  9/13  Diagnostic/Therapeutic right Thoracentesis. 650 ML clear appearing. Transudate by Light Criteria  9/14  lasix for neg volume, weaning.  9/15  lasix and albumin. Passed SBT but had sig WOB and tachycardia.  9/16  failed SBT. Still positive volume in spite of lasix. Still on low dose pressors but may be r/t diprivan. Albumin/lasix x 3 doses. Family updated. Will likely need trach. Anemic. Getting 2 units blood.  CT of abd with fluid collection that appears to be the density of water 9/17  Bradycardic arrest after N/V & probable aspiration.  ACLS. 9/19  Anemia, tx 2 units.  Repeat CT found bleeding into fluid collection, hypoglycemia, AMS with pH 6.9, vent changes / changed vent out and repeat 7.4 9/20  Anemia noted overnight, transfused one unit PRBC, hypoglycemia, period of AMS with acidosis, vent changed out and rate adjusted with improvement.  Off levophed.  CVVHD started.  9/22  Trach per DF  SUBJECTIVE:   Planned trach for am 9/22, net negative 7,767 ml from cvvhd   VITAL SIGNS: Temp:  [97.4 F (36.3 C)-98 F (36.7 C)] 97.4 F (36.3 C) (09/22 0400) Pulse Rate:  [84-101] 100 (09/22 0700) Resp:  [0-25] 21 (09/22 0700) BP: (64-125)/(40-103) 125/84 mmHg (09/22 0700) SpO2:  [93 %-100 %] 96 % (09/22 0700) FiO2 (%):  [40 %] 40 % (09/22 0407) Weight:  [119  lb 9.2 oz (81 kg)] 178 lb 9.2 oz (81 kg) (09/22 0403)      VENTILATOR SETTINGS: Vent Mode:  [-] PRVC FiO2 (%):  [40 %] 40 % Set Rate:  [20 bmp] 20 bmp Vt Set:  [500 mL] 500 mL PEEP:  [5 cmH20] 5 cmH20 Plateau Pressure:  [20 cmH20-36 cmH20] 24  cmH20   INTAKE / OUTPUT:  Intake/Output Summary (Last 24 hours) at 01/08/15 0804 Last data filed at 01/08/15 0800  Gross per 24 hour  Intake 5321.86 ml  Output   8080 ml  Net -2758.14 ml    PHYSICAL EXAMINATION: General:  Chronically ill adult female in NAD. Integument:  Warm & dry. No rash on exposed skin. Mottling & cyanosis of bilateral upper extremity & lower extremity digits.  Ecchymosis on R flank HEENT:  No scleral injection or icterus. Endotracheal tube in place. PERRL. Cardiovascular:  s1s2 rrr, no m/r/g.  Anasarca + Pulmonary:  Coarse breath sounds bilaterally, rhonchi. Symmetric chest wall rise on ventilator. Abdomen: Soft. Normal bowel sounds. Nondistended. Right-sided ostomy in place. Neurological:  Moving all 4 extremities equally. Cranial nerves appear grossly intact. Nods to questions, attempts to communicate.   LABS:  CBC  Recent Labs Lab 01/06/15 1027  01/07/15 0640  01/08/15 0525 01/08/15 0705 01/08/15 0714  WBC 7.4  --  10.3  --  19.5*  --   --   HGB 7.7*  < > 7.8*  < > 9.0* 9.2* 8.8*  HCT 22.4*  < > 23.2*  < > 27.5* 27.0* 26.0*  PLT 195  --  235  --  305  --   --   < > = values in this interval not displayed. BMET  Recent Labs Lab 01/07/15 0640  01/07/15 1644  01/08/15 0525 01/08/15 0705 01/08/15 0714  NA 128*  < > 133*  < > 134* 134* 132*  K 4.4  < > 4.0  < > 3.7 3.5 3.5  CL 96*  < > 98*  < > 96* 90* 91*  CO2 20*  --  23  --  26  --   --   BUN 62*  < > 47*  < > 32* 23* 26*  CREATININE 2.24*  < > 1.84*  < > 1.32* 1.00 1.20*  GLUCOSE 108*  < > 144*  < > 146* 184* 146*  < > = values in this interval not displayed.   Electrolytes  Recent Labs Lab 01/06/15 0420  01/07/15 0640 01/07/15 1644 01/08/15 0525  CALCIUM 7.5*  < > 7.4* 7.4* 8.0*  MG 2.1  --  2.1  --  1.9  PHOS 8.5*  < > 6.2* 4.6 2.9  < > = values in this interval not displayed.   Sepsis Markers  Recent Labs Lab 01/02/15 2320 01/03/15 1000 01/03/15 1615 01/03/15 2100  01/04/15 0500 01/05/15 0434  LATICACIDVEN 0.8 5.7* 1.6  --   --   --   PROCALCITON  --   --   --  26.94 29.42 24.77   ABG  Recent Labs Lab 01/05/15 2035 01/05/15 2335 01/07/15 1123  PHART 6.992* 7.414 7.352  PCO2ART 85.4* 27.4* 37.7  PO2ART 89.4 115* 68.4*    Liver Enzymes  Recent Labs Lab 01/03/15 1020  01/07/15 0640 01/07/15 1644 01/08/15 0525  AST 30  --   --   --   --   ALT 13*  --   --   --   --   ALKPHOS 163*  --   --   --   --  BILITOT 0.8  --   --   --   --   ALBUMIN 2.2*  < > 1.7* 1.7* 1.8*  < > = values in this interval not displayed.   Cardiac Enzymes  Recent Labs Lab 01/03/15 1020 01/03/15 1615 01/03/15 2100  TROPONINI 0.04* 0.41* 0.33*    Glucose  Recent Labs Lab 01/07/15 0424 01/07/15 0756 01/07/15 1206 01/07/15 1620 01/07/15 1951 01/07/15 2321  GLUCAP 107* 99 112* 126* 113* 177*    Imaging No results found.  ASSESSMENT / PLAN:  PULMONARY ETT 9/06 >> 9/10 ETT 9/11 >> 9/22 Trach 9/22 >>   A: Acute Hypoxic Respiratory Failure - Multifactorial from Pneumonia, Volume Overload & acute aspiration event (9/17) Pulmonary Edema  Pink/Frothy Sputum - resolved Bronchospasm Bilateral Pleural Effusions - S/P Thoracentesis/Transudate. Presumed COPD - H/O Tobacco Use. Hypercarbia - 9/19 ABG 6.9, pco2 85  P:   Per trach am 9/22 per DF SBT as tolerated  Scheduled Duonebs Budesonide nebs Wean PEEP / FiO2 for sats > 90% Intermittent CXR Adjust vent rate to 20 Negative balance with CVVHD as tolerated  CARDIOVASCULAR Rt PICC 9/06 >> A:  Bradycardic Arrest 9/17 - Likely due to aspiration. A-Fib with RVR- converted to NSR. Currently ST.  Shock - Likely ongoing due to sedation, off pressors on 9/20 H/O hypertension  P:  Telemetry monitoring  Holding ASA SCD's only with bleeding  RENAL A:   Acute Renal Failure - worsening with anuria 9/20. Hypervolemia - due to hypoalbuminemia Metabolic acidosis  Hypokalemia -  resolved Hypomagnesemia - resolved. Hyponatremia   P:   Trend BMP / Dupree  Nephrology following, appreciate input Replace electrolytes as indicated  CVVHD per Nephrology   GASTROINTESTINAL A:   Abdominal Hemorrhage into Fluid Collections on R / Intraperitoneal Hemorrhage Chronic diarrhea with C diff colonization Hypoalbuminemia  N/V - Holding tube feedings Hx of GERD  P:   Resume TF 9/21, trickle feeding and assess how patient tolerates.  HOLD for planned trach 9/22.  Protonix IV daily Zofran IV prn See ID Do not think she would be a surgical candidate.  Discussed with general surgery as well and not an operative candidate due to multiple prior belly surgeries & poor overall health  HEMATOLOGIC A:   Anemia - No signs of active bleeding on CT. S/P 2u PRBC 9/16. & 1u PRBC 9/17. Hx of CMML with chronic anemia.  P:  Trend Hgb daily w/ CBC SCDs Transfuse for Hgb <7 Minimize blood draws as able   INFECTIOUS A:   Septic shock - Secondary to Pneumonia & questionable bacteremia. Questionable Streptococcal Pneumonia - Completed Tressie Ellis 9/14 Streptococcal Viridans Bacteremia - Completed Tressie Ellis 9/14  Cdiff colonization  P:   Trend Leukocytosis Procalcitonin algorithm  Linezolid 9/17 >> 9/19 Aztreonam 9/17 >>  Flagyl IV 9/17 >>  Blood Ctx 9/17 >> Urine Ctx 9/17 >> enterococcus >> sens vanco Resp Ctx 9/17 >> enterobacter cloacae >>  Blood Ctx 9/13 >> negative  R Pleural Effusion 9/13 >> negative  BAL 9/7>>Bacteria Neg/AFB pending/Fungal pending  Blood Ctx 9/5 >> 1/2 positive for Strep Viridans Urine Strep Ag 9/5 >> negative Urine Leg Ag 9/5 >> negative  ENDOCRINE A: Hyperglycemia  P: Monitor BG & RN to notify for <90 or >160 D10 @ 50 ml/hr, continue until we can see if patient tolerates TF  NEUROLOGY A: Chest Pain post resuscitation H/O Chronic pain Depression  P: RASS goal: 0 to -1 Fentanyl IV prn   TODAYS SUMMARY:  64 year old female with atrial  fibrillation and pneumonia with repeat intubation due to hypoxic respiratory failure. Patient has had failure to wean likely owing to pulmonary edema from hypoalbuminemic state. Patient suffered bradycardic arrest on 9/17 after episode of emesis. Patient had no evidence of bowel obstruction or residual tube feeds prior to this event. With elevation of Procalcitonin and no obvious source for shock otherwise in the face of leukocytosis broad-spectrum antibiotics were restarted after repeating cultures. Hgb dropped overnight 9/19.  Repeat CT abd showed bleeding into prior fluid collections. CVVHD initiated on 9/20.  Clinical prognosis remains guarded given multiple medical problems. Family very involved in patients care.  Continue to request full code status.   Trach performed 9/22.     Noe Gens, NP-C [facility] Pgr: [phone] or if no answer [phone] 01/08/2015, 8:04 AM

## 2015-01-08 NOTE — Procedures (Signed)
Bedside Tracheostomy Insertion Procedure Note   Patient Details:   Name: [name] DOB: 17-Oct-1950 MRN: [id number]  Procedure: Tracheostomy  Pre Procedure Assessment: ET Tube Size:7.0 ET Tube secured at lip (cm):23 at the teeth Bite block in place: Yes Breath Sounds: Rhonch  Post Procedure Assessment: BP 125/84 mmHg  Pulse 96  Temp(Src) 97.4 F (36.3 C) (Axillary)  Resp 20  Ht 5\' 5"  (1.651 m)  Wt 178 lb 9.2 oz (81 kg)  BMI 29.72 kg/m2  SpO2 96% O2 sats: stable throughout Complications: No apparent complications Patient did tolerate procedure well Tracheostomy Brand:Shiley Tracheostomy Style:Cuffed Tracheostomy Size: 6.0 Tracheostomy Secured RZN:BVAPOLI and velcro Tracheostomy Placement Confirmation:Trach cuff visualized and in place  And chest x-ray    Andre Lefort Nanette 01/08/2015, 9:38 AM

## 2015-01-08 NOTE — Progress Notes (Signed)
Small bore feeding tube clogged around 1445. Multiple attempts to unclog tube to avoid reinsertion made. All attempts unsuccessful. New feeding tube placed and awaiting ABD x-ray placement.

## 2015-01-08 NOTE — Progress Notes (Signed)
CRRT Filter changed at 1800 due to air in blood. Unable to return blood to patient. CRRT restarted at [place].

## 2015-01-09 ENCOUNTER — Other Ambulatory Visit: Payer: Self-pay

## 2015-01-09 ENCOUNTER — Ambulatory Visit (HOSPITAL_COMMUNITY): Payer: 59

## 2015-01-09 ENCOUNTER — Inpatient Hospital Stay (HOSPITAL_COMMUNITY): Payer: 59

## 2015-01-09 DIAGNOSIS — M7989 Other specified soft tissue disorders: Secondary | ICD-10-CM

## 2015-01-09 DIAGNOSIS — N179 Acute kidney failure, unspecified: Secondary | ICD-10-CM

## 2015-01-09 LAB — RENAL FUNCTION PANEL
ALBUMIN: 1.7 g/dL — AB (ref 3.5–5.0)
ANION GAP: 12 (ref 5–15)
ANION GAP: 11 (ref 5–15)
Albumin: 1.6 g/dL — ABNORMAL LOW (ref 3.5–5.0)
BUN: 18 mg/dL (ref 6–20)
BUN: 14 mg/dL (ref 6–20)
CALCIUM: 10.3 mg/dL (ref 8.9–10.3)
CHLORIDE: 89 mmol/L — AB (ref 101–111)
CO2: 29 mmol/L (ref 22–32)
CO2: 31 mmol/L (ref 22–32)
CREATININE: 0.83 mg/dL (ref 0.44–1.00)
CREATININE: 0.76 mg/dL (ref 0.44–1.00)
Calcium: 10.1 mg/dL (ref 8.9–10.3)
Chloride: 91 mmol/L — ABNORMAL LOW (ref 101–111)
GFR calc Af Amer: >60 mL/min (ref >60)
GFR calc non Af Amer: >60 mL/min (ref >60)
GLUCOSE: 146 mg/dL — AB (ref 65–99)
Glucose, Bld: 123 mg/dL — ABNORMAL HIGH (ref 65–99)
PHOSPHORUS: 1.8 mg/dL — AB (ref 2.5–4.6)
Phosphorus: 2.4 mg/dL — ABNORMAL LOW (ref 2.5–4.6)
Potassium: 3.5 mmol/L (ref 3.5–5.1)
Potassium: 3.9 mmol/L (ref 3.5–5.1)
SODIUM: 132 mmol/L — AB (ref 135–145)
Sodium: 131 mmol/L — ABNORMAL LOW (ref 135–145)

## 2015-01-09 LAB — POCT I-STAT, CHEM 8
BUN: 14 mg/dL (ref 6–20)
BUN: 14 mg/dL (ref 6–20)
BUN: 15 mg/dL (ref 6–20)
BUN: 17 mg/dL (ref 6–20)
BUN: 18 mg/dL (ref 6–20)
BUN: 16 mg/dL (ref 6–20)
BUN: 12 mg/dL (ref 6–20)
BUN: 10 mg/dL (ref 6–20)
BUN: 14 mg/dL (ref 6–20)
BUN: 11 mg/dL (ref 6–20)
BUN: 19 mg/dL (ref 6–20)
BUN: 8 mg/dL (ref 6–20)
CALCIUM ION: 0.4 mmol/L — AB (ref 1.13–1.30)
CALCIUM ION: 1.13 mmol/L (ref 1.13–1.30)
CHLORIDE: 85 mmol/L — AB (ref 101–111)
CHLORIDE: 86 mmol/L — AB (ref 101–111)
CHLORIDE: 87 mmol/L — AB (ref 101–111)
CHLORIDE: 87 mmol/L — AB (ref 101–111)
CHLORIDE: 88 mmol/L — AB (ref 101–111)
CHLORIDE: 89 mmol/L — AB (ref 101–111)
CREATININE: 0.8 mg/dL (ref 0.44–1.00)
CREATININE: 0.6 mg/dL (ref 0.44–1.00)
CREATININE: 0.8 mg/dL (ref 0.44–1.00)
Calcium, Ion: 0.42 mmol/L — CL (ref 1.13–1.30)
Calcium, Ion: 0.43 mmol/L — CL (ref 1.13–1.30)
Calcium, Ion: 0.43 mmol/L — CL (ref 1.13–1.30)
Calcium, Ion: 1.13 mmol/L (ref 1.13–1.30)
Calcium, Ion: 1.15 mmol/L (ref 1.13–1.30)
Calcium, Ion: 1.17 mmol/L (ref 1.13–1.30)
Calcium, Ion: 0.37 mmol/L — CL (ref 1.13–1.30)
Calcium, Ion: 1.11 mmol/L — ABNORMAL LOW (ref 1.13–1.30)
Calcium, Ion: 0.37 mmol/L — CL (ref 1.13–1.30)
Calcium, Ion: 1.18 mmol/L (ref 1.13–1.30)
Chloride: 88 mmol/L — ABNORMAL LOW (ref 101–111)
Chloride: 89 mmol/L — ABNORMAL LOW (ref 101–111)
Chloride: 85 mmol/L — ABNORMAL LOW (ref 101–111)
Chloride: 87 mmol/L — ABNORMAL LOW (ref 101–111)
Chloride: 88 mmol/L — ABNORMAL LOW (ref 101–111)
Chloride: 87 mmol/L — ABNORMAL LOW (ref 101–111)
Creatinine, Ser: 0.7 mg/dL (ref 0.44–1.00)
Creatinine, Ser: 0.7 mg/dL (ref 0.44–1.00)
Creatinine, Ser: 0.9 mg/dL (ref 0.44–1.00)
Creatinine, Ser: 0.9 mg/dL (ref 0.44–1.00)
Creatinine, Ser: 1.1 mg/dL — ABNORMAL HIGH (ref 0.44–1.00)
Creatinine, Ser: 1 mg/dL (ref 0.44–1.00)
Creatinine, Ser: 0.5 mg/dL (ref 0.44–1.00)
Creatinine, Ser: 0.5 mg/dL (ref 0.44–1.00)
Creatinine, Ser: 0.8 mg/dL (ref 0.44–1.00)
GLUCOSE: 146 mg/dL — AB (ref 65–99)
GLUCOSE: 199 mg/dL — AB (ref 65–99)
Glucose, Bld: 152 mg/dL — ABNORMAL HIGH (ref 65–99)
Glucose, Bld: 161 mg/dL — ABNORMAL HIGH (ref 65–99)
Glucose, Bld: 178 mg/dL — ABNORMAL HIGH (ref 65–99)
Glucose, Bld: 204 mg/dL — ABNORMAL HIGH (ref 65–99)
Glucose, Bld: 209 mg/dL — ABNORMAL HIGH (ref 65–99)
Glucose, Bld: 221 mg/dL — ABNORMAL HIGH (ref 65–99)
Glucose, Bld: 126 mg/dL — ABNORMAL HIGH (ref 65–99)
Glucose, Bld: 196 mg/dL — ABNORMAL HIGH (ref 65–99)
Glucose, Bld: 113 mg/dL — ABNORMAL HIGH (ref 65–99)
Glucose, Bld: 184 mg/dL — ABNORMAL HIGH (ref 65–99)
HCT: 26 % — ABNORMAL LOW (ref 36.0–46.0)
HCT: 27 % — ABNORMAL LOW (ref 36.0–46.0)
HCT: 30 % — ABNORMAL LOW (ref 36.0–46.0)
HCT: 23 % — ABNORMAL LOW (ref 36.0–46.0)
HEMATOCRIT: 23 % — AB (ref 36.0–46.0)
HEMATOCRIT: 24 % — AB (ref 36.0–46.0)
HEMATOCRIT: 25 % — AB (ref 36.0–46.0)
HEMATOCRIT: 26 % — AB (ref 36.0–46.0)
HEMATOCRIT: 26 % — AB (ref 36.0–46.0)
HEMATOCRIT: 27 % — AB (ref 36.0–46.0)
HEMATOCRIT: 27 % — AB (ref 36.0–46.0)
HEMATOCRIT: 27 % — AB (ref 36.0–46.0)
HEMOGLOBIN: 7.8 g/dL — AB (ref 12.0–15.0)
HEMOGLOBIN: 7.8 g/dL — AB (ref 12.0–15.0)
HEMOGLOBIN: 8.2 g/dL — AB (ref 12.0–15.0)
HEMOGLOBIN: 8.8 g/dL — AB (ref 12.0–15.0)
HEMOGLOBIN: 9.2 g/dL — AB (ref 12.0–15.0)
HEMOGLOBIN: 9.2 g/dL — AB (ref 12.0–15.0)
HEMOGLOBIN: 9.2 g/dL — AB (ref 12.0–15.0)
Hemoglobin: 8.5 g/dL — ABNORMAL LOW (ref 12.0–15.0)
Hemoglobin: 8.8 g/dL — ABNORMAL LOW (ref 12.0–15.0)
Hemoglobin: 8.8 g/dL — ABNORMAL LOW (ref 12.0–15.0)
Hemoglobin: 10.2 g/dL — ABNORMAL LOW (ref 12.0–15.0)
Hemoglobin: 9.2 g/dL — ABNORMAL LOW (ref 12.0–15.0)
POTASSIUM: 3.2 mmol/L — AB (ref 3.5–5.1)
POTASSIUM: 3.2 mmol/L — AB (ref 3.5–5.1)
POTASSIUM: 3.3 mmol/L — AB (ref 3.5–5.1)
POTASSIUM: 3.3 mmol/L — AB (ref 3.5–5.1)
POTASSIUM: 3.4 mmol/L — AB (ref 3.5–5.1)
POTASSIUM: 3.6 mmol/L (ref 3.5–5.1)
POTASSIUM: 3.6 mmol/L (ref 3.5–5.1)
POTASSIUM: 3.7 mmol/L (ref 3.5–5.1)
POTASSIUM: 3.8 mmol/L (ref 3.5–5.1)
Potassium: 3.3 mmol/L — ABNORMAL LOW (ref 3.5–5.1)
Potassium: 3.4 mmol/L — ABNORMAL LOW (ref 3.5–5.1)
Potassium: 3.4 mmol/L — ABNORMAL LOW (ref 3.5–5.1)
SODIUM: 125 mmol/L — AB (ref 135–145)
SODIUM: 129 mmol/L — AB (ref 135–145)
SODIUM: 129 mmol/L — AB (ref 135–145)
SODIUM: 129 mmol/L — AB (ref 135–145)
SODIUM: 129 mmol/L — AB (ref 135–145)
SODIUM: 129 mmol/L — AB (ref 135–145)
SODIUM: 130 mmol/L — AB (ref 135–145)
SODIUM: 132 mmol/L — AB (ref 135–145)
SODIUM: 133 mmol/L — AB (ref 135–145)
Sodium: 131 mmol/L — ABNORMAL LOW (ref 135–145)
Sodium: 133 mmol/L — ABNORMAL LOW (ref 135–145)
Sodium: 132 mmol/L — ABNORMAL LOW (ref 135–145)
TCO2: 23 mmol/L (ref 0–100)
TCO2: 24 mmol/L (ref 0–100)
TCO2: 24 mmol/L (ref 0–100)
TCO2: 25 mmol/L (ref 0–100)
TCO2: 25 mmol/L (ref 0–100)
TCO2: 26 mmol/L (ref 0–100)
TCO2: 26 mmol/L (ref 0–100)
TCO2: 24 mmol/L (ref 0–100)
TCO2: 25 mmol/L (ref 0–100)
TCO2: 29 mmol/L (ref 0–100)
TCO2: 25 mmol/L (ref 0–100)
TCO2: 29 mmol/L (ref 0–100)

## 2015-01-09 LAB — CULTURE, BLOOD (ROUTINE X 2)
CULTURE: NO GROWTH
Culture: NO GROWTH

## 2015-01-09 LAB — GLUCOSE, CAPILLARY
GLUCOSE-CAPILLARY: 146 mg/dL — AB (ref 65–99)
GLUCOSE-CAPILLARY: 138 mg/dL — AB (ref 65–99)
GLUCOSE-CAPILLARY: 193 mg/dL — AB (ref 65–99)
GLUCOSE-CAPILLARY: 115 mg/dL — AB (ref 65–99)
GLUCOSE-CAPILLARY: 109 mg/dL — AB (ref 65–99)
Glucose-Capillary: 127 mg/dL — ABNORMAL HIGH (ref 65–99)
Glucose-Capillary: 172 mg/dL — ABNORMAL HIGH (ref 65–99)
Glucose-Capillary: 140 mg/dL — ABNORMAL HIGH (ref 65–99)
Glucose-Capillary: 115 mg/dL — ABNORMAL HIGH (ref 65–99)
Glucose-Capillary: 129 mg/dL — ABNORMAL HIGH (ref 65–99)

## 2015-01-09 LAB — APTT: aPTT: 27 seconds (ref 24–37)

## 2015-01-09 LAB — CALCIUM, IONIZED
CALCIUM, IONIZED, SERUM: 3.4 mg/dL — AB (ref 4.5–5.6)
CALCIUM, IONIZED, SERUM: 5 mg/dL (ref 4.5–5.6)

## 2015-01-09 LAB — CBC WITH DIFFERENTIAL/PLATELET
BASOS ABS: 0 10*3/uL (ref 0.0–0.1)
Basophils Relative: 0 %
EOS ABS: 0.3 10*3/uL (ref 0.0–0.7)
Eosinophils Relative: 1 %
HCT: 25 % — ABNORMAL LOW (ref 36.0–46.0)
Hemoglobin: 8.3 g/dL — ABNORMAL LOW (ref 12.0–15.0)
LYMPHS ABS: 2.4 10*3/uL (ref 0.7–4.0)
Lymphocytes Relative: 8 %
MCH: 29.1 pg (ref 26.0–34.0)
MCHC: 33.2 g/dL (ref 30.0–36.0)
MCV: 87.7 fL (ref 78.0–100.0)
MONO ABS: 6.2 10*3/uL — AB (ref 0.1–1.0)
MONOS PCT: 21 %
NEUTROS ABS: 20.5 10*3/uL — AB (ref 1.7–7.7)
Neutrophils Relative %: 70 %
PLATELETS: 287 10*3/uL (ref 150–400)
RBC: 2.85 MIL/uL — AB (ref 3.87–5.11)
RDW: 19.2 % — AB (ref 11.5–15.5)
WBC: 29.4 10*3/uL — AB (ref 4.0–10.5)

## 2015-01-09 LAB — MAGNESIUM: Magnesium: 1.6 mg/dL — ABNORMAL LOW (ref 1.7–2.4)

## 2015-01-09 LAB — CULTURE, RESPIRATORY W GRAM STAIN: Special Requests: NORMAL

## 2015-01-09 LAB — CULTURE, RESPIRATORY

## 2015-01-09 MED ORDER — AMIODARONE HCL IN DEXTROSE 360-4.14 MG/200ML-% IV SOLN
60.0000 mg/h | INTRAVENOUS | Status: AC
  Administered 2015-01-10: 60 mg/h via INTRAVENOUS

## 2015-01-09 MED ORDER — SODIUM CHLORIDE 0.9 % IV SOLN
25.0000 ug/h | INTRAVENOUS | Status: DC
  Administered 2015-01-09: 100 ug/h via INTRAVENOUS
  Administered 2015-01-10 (×2): 175 ug/h via INTRAVENOUS
  Administered 2015-01-11: 300 ug/h via INTRAVENOUS
  Administered 2015-01-11: 200 ug/h via INTRAVENOUS
  Administered 2015-01-11 – 2015-01-12 (×2): 300 ug/h via INTRAVENOUS
  Filled 2015-01-09 (×7): qty 50

## 2015-01-09 MED ORDER — NEPRO/CARBSTEADY PO LIQD
1000.0000 mL | ORAL | Status: DC
  Administered 2015-01-09 – 2015-01-11 (×3): 1000 mL via ORAL
  Filled 2015-01-09 (×4): qty 1000

## 2015-01-09 MED ORDER — PHENYLEPHRINE HCL 10 MG/ML IJ SOLN
30.0000 ug/min | INTRAMUSCULAR | Status: DC
  Administered 2015-01-09: 60 ug/min via INTRAVENOUS
  Administered 2015-01-10: 80 ug/min via INTRAVENOUS
  Administered 2015-01-10: 85 ug/min via INTRAVENOUS
  Administered 2015-01-10: 65 ug/min via INTRAVENOUS
  Administered 2015-01-11 (×2): 70 ug/min via INTRAVENOUS
  Administered 2015-01-11: 75 ug/min via INTRAVENOUS
  Administered 2015-01-12: 70 ug/min via INTRAVENOUS
  Filled 2015-01-09 (×10): qty 4

## 2015-01-09 MED ORDER — AMIODARONE HCL IN DEXTROSE 360-4.14 MG/200ML-% IV SOLN
INTRAVENOUS | Status: AC
  Filled 2015-01-09: qty 200

## 2015-01-09 MED ORDER — AMIODARONE HCL IN DEXTROSE 360-4.14 MG/200ML-% IV SOLN
30.0000 mg/h | INTRAVENOUS | Status: DC
  Administered 2015-01-10 – 2015-01-12 (×4): 30 mg/h via INTRAVENOUS
  Filled 2015-01-09 (×5): qty 200

## 2015-01-09 MED ORDER — AMIODARONE LOAD VIA INFUSION
150.0000 mg | Freq: Once | INTRAVENOUS | Status: AC
  Administered 2015-01-09: 150 mg via INTRAVENOUS
  Filled 2015-01-09: qty 83.34

## 2015-01-09 MED ORDER — AMIODARONE IV BOLUS ONLY 150 MG/100ML
INTRAVENOUS | Status: AC
  Administered 2015-01-09: 150 mg
  Filled 2015-01-09: qty 100

## 2015-01-09 MED ORDER — SODIUM CHLORIDE 0.9 % IV SOLN
500.0000 mg | Freq: Four times a day (QID) | INTRAVENOUS | Status: DC
  Administered 2015-01-09 – 2015-01-12 (×12): 500 mg via INTRAVENOUS
  Filled 2015-01-09 (×14): qty 500

## 2015-01-09 MED ORDER — POTASSIUM CHLORIDE 10 MEQ/50ML IV SOLN
10.0000 meq | INTRAVENOUS | Status: DC
  Administered 2015-01-09 (×2): 10 meq via INTRAVENOUS
  Filled 2015-01-09: qty 50

## 2015-01-09 MED ORDER — POTASSIUM PHOSPHATES 15 MMOLE/5ML IV SOLN
30.0000 mmol | Freq: Once | INTRAVENOUS | Status: AC
  Administered 2015-01-09: 30 mmol via INTRAVENOUS
  Filled 2015-01-09: qty 10

## 2015-01-09 MED ORDER — POTASSIUM CHLORIDE 10 MEQ/50ML IV SOLN
10.0000 meq | INTRAVENOUS | Status: AC
  Administered 2015-01-09: 10 meq via INTRAVENOUS

## 2015-01-09 MED ORDER — MAGNESIUM SULFATE 2 GM/50ML IV SOLN
2.0000 g | Freq: Once | INTRAVENOUS | Status: AC
  Administered 2015-01-09: 2 g via INTRAVENOUS
  Filled 2015-01-09: qty 50

## 2015-01-09 MED ORDER — FENTANYL CITRATE (PF) 100 MCG/2ML IJ SOLN
50.0000 ug | INTRAMUSCULAR | Status: DC | PRN
  Administered 2015-01-09 – 2015-01-10 (×2): 50 ug via INTRAVENOUS
  Administered 2015-01-11 – 2015-01-12 (×3): 200 ug via INTRAVENOUS
  Administered 2015-01-12: 100 ug via INTRAVENOUS
  Administered 2015-01-12: 200 ug via INTRAVENOUS

## 2015-01-09 NOTE — Progress Notes (Signed)
ANTIBIOTIC CONSULT NOTE -   Pharmacy Consult for Primaxin, Fluconazole Indication: pneumonia, candidal UTI  Allergies  Allergen Reactions  . Vancomycin Hives and Rash    wheezing  . Ativan [Lorazepam] Other (See Comments)    "makes me crazy"  . Codeine Nausea And Vomiting  . Digoxin And Related Swelling  . Tetanus Toxoids Other (See Comments)    Serum reaction  . Penicillins Hives and Rash  . Xarelto [Rivaroxaban] Hives and Rash    (May not be allergic. Vancomycin was also being taken when reaction occurred.)   Patient Measurements: Height: 5\' 5"  (165.1 cm) Weight: 176 lb 9.4 oz (80.1 kg) IBW/kg (Calculated) : 57  Vital Signs: Temp: 97.8 F (36.6 C) (09/23 0730) Temp Source: Oral (09/23 0730) BP: 141/120 mmHg (09/23 0900) Pulse Rate: 105 (09/23 0900) Intake/Output from previous day: 09/22 0701 - 09/23 0700 In: 7955.5 [I.V.:6121.5; NG/GT:209; IV Piggyback:1330] Out: 9626 [Urine:176; Stool:280] Intake/Output from this shift: Total I/O In: 817.7 [I.V.:514.7; NG/GT:30; IV Piggyback:273] Out: 1001 [Urine:5; Other:846; Stool:150]  Labs:  Recent Labs  01/06/15 1639 01/07/15 0640  01/08/15 0525  01/09/15 0422 01/09/15 0429 01/09/15 0440  WBC  --  10.3  --  19.5*  --   --   --  29.4*  HGB  --  7.8*  < > 9.0*  < > 9.2* 10.2* 8.3*  PLT  --  235  --  305  --   --   --  287  LABCREA 57.91  --   --   --   --   --   --   --   CREATININE  --  2.24*  < > 1.32*  < > 0.60 1.00 0.83  < > = values in this interval not displayed. Estimated Creatinine Clearance: 70.6 mL/min (by C-G formula based on Cr of 0.83). No results for input(s): VANCOTROUGH, VANCOPEAK, VANCORANDOM, GENTTROUGH, GENTPEAK, GENTRANDOM, TOBRATROUGH, TOBRAPEAK, TOBRARND, AMIKACINPEAK, AMIKACINTROU, AMIKACIN in the last 72 hours.   Microbiology: Recent Results (from the past 720 hour(s))  TECHNOLOGIST REVIEW     Status: None   Collection Time: 12/11/14 10:49 AM  Result Value Ref Range Status   Technologist  Review   Final    Metas and Myelocytes, 1% nrbc, mod teardrops and ovalocytes, few shistocytes present  Blood culture (routine x 2)     Status: None   Collection Time: 01/07/2015  6:32 PM  Result Value Ref Range Status   Specimen Description BLOOD LEFT ANTECUBITAL  Final   Special Requests BOTTLES DRAWN AEROBIC AND ANAEROBIC 5CC  Final   Culture  Setup Time   Final    GRAM VARIABLE COCCOBACILLI AEROBIC BOTTLE ONLY CRITICAL RESULT CALLED TO, READ BACK BY AND VERIFIED WITH: D ALDRIDGE,RN AT 1220 12/24/14 BY L BENFIELD    Culture   Final    VIRIDANS STREPTOCOCCUS THE SIGNIFICANCE OF ISOLATING THIS ORGANISM FROM A SINGLE SET OF BLOOD CULTURES WHEN MULTIPLE SETS ARE DRAWN IS UNCERTAIN. PLEASE NOTIFY THE MICROBIOLOGY DEPARTMENT WITHIN ONE WEEK IF SPECIATION AND SENSITIVITIES ARE REQUIRED. Performed at [facility]    Report Status 12/26/2014 FINAL  Final  Blood culture (routine x 2)     Status: None   Collection Time: 01/03/2015  8:55 PM  Result Value Ref Range Status   Specimen Description BLOOD LEFT HAND  Final   Special Requests BOTTLES DRAWN AEROBIC ONLY 5CC  Final   Culture   Final    NO GROWTH 5 DAYS Performed at [facility]  Report Status 12/28/2014 FINAL  Final  C difficile quick scan w PCR reflex     Status: Abnormal   Collection Time: 01/02/2015  9:08 PM  Result Value Ref Range Status   C Diff antigen POSITIVE (A) NEGATIVE Final   C Diff toxin NEGATIVE NEGATIVE Final   C Diff interpretation   Final    C. difficile present, but toxin not detected. This indicates colonization. In most cases, this does not require treatment. If patient has signs and symptoms consistent with colitis, consider treatment.    Comment: RESULT CALLED TO, READ BACK BY AND VERIFIED WITH: Q MBEMENA RN [id number] 12/27/2014 A NAVARRO   MRSA PCR Screening     Status: Abnormal   Collection Time: 01/02/2015  9:46 PM  Result Value Ref Range Status   MRSA by PCR POSITIVE (A) NEGATIVE Final    Comment:         The GeneXpert MRSA Assay (FDA approved for NASAL specimens only), is one component of a comprehensive MRSA colonization surveillance program. It is not intended to diagnose MRSA infection nor to guide or monitor treatment for MRSA infections. RESULT CALLED TO, READ BACK BY AND VERIFIED WITH: Q MBEMENA RN [id number] 12/23/14 A NAVARRO   Culture, bal-quantitative     Status: None   Collection Time: 12/24/14 10:47 AM  Result Value Ref Range Status   Specimen Description BRONCHIAL ALVEOLAR LAVAGE  Final   Special Requests Immunocompromised  Final   Gram Stain   Final    RARE WBC PRESENT, PREDOMINANTLY MONONUCLEAR RARE SQUAMOUS EPITHELIAL CELLS PRESENT NO ORGANISMS SEEN Performed at [place] NO GROWTH Performed at [facility]   Final   Culture   Final    NO GROWTH 2 DAYS Performed at [facility]    Report Status 12/26/2014 FINAL  Final  Fungus Culture with Smear     Status: None (Preliminary result)   Collection Time: 12/24/14 10:47 AM  Result Value Ref Range Status   Specimen Description BRONCHIAL ALVEOLAR LAVAGE  Final   Special Requests Immunocompromised  Final   Fungal Smear   Final    NO YEAST OR FUNGAL ELEMENTS SEEN Performed at [facility]    Culture   Final    CANDIDA ALBICANS Performed at [facility]    Report Status PENDING  Incomplete  AFB culture with smear     Status: None (Preliminary result)   Collection Time: 12/24/14 10:47 AM  Result Value Ref Range Status   Specimen Description BRONCHIAL ALVEOLAR LAVAGE  Final   Special Requests Immunocompromised  Final   Acid Fast Smear   Final    NO ACID FAST BACILLI SEEN Performed at [facility]    Culture   Final    CULTURE WILL BE EXAMINED FOR 6 WEEKS BEFORE ISSUING A FINAL REPORT Performed at [facility]    Report Status PENDING  Incomplete  Body fluid culture     Status: None   Collection Time: 12/30/14 12:34 PM  Result Value  Ref Range Status   Specimen Description PLEURAL  Final   Special Requests Normal  Final   Gram Stain   Final    MODERATE WBC PRESENT, PREDOMINANTLY MONONUCLEAR NO ORGANISMS SEEN    Culture   Final    NO GROWTH 3 DAYS Performed at [facility]    Report Status 01/02/2015 FINAL  Final  Culture, blood (routine x 2)     Status:  None   Collection Time: 12/30/14  5:15 PM  Result Value Ref Range Status   Specimen Description BLOOD LEFT HAND  Final   Special Requests IN PEDIATRIC BOTTLE  2CC  Final   Culture   Final    NO GROWTH 5 DAYS Performed at [facility]    Report Status 01/04/2015 FINAL  Final  Culture, blood (routine x 2)     Status: None   Collection Time: 12/30/14  5:30 PM  Result Value Ref Range Status   Specimen Description BLOOD LEFT HAND  Final   Special Requests IN PEDIATRIC BOTTLE  .5CC  Final   Culture   Final    NO GROWTH 5 DAYS Performed at [facility]    Report Status 01/04/2015 FINAL  Final  Culture, blood (routine x 2)     Status: None (Preliminary result)   Collection Time: 01/03/15 11:20 PM  Result Value Ref Range Status   Specimen Description BLOOD LEFT HAND  Final   Special Requests IN PEDIATRIC BOTTLE  2.5 CC  Final   Culture   Final    NO GROWTH 4 DAYS Performed at [facility]    Report Status PENDING  Incomplete  Culture, blood (routine x 2)     Status: None (Preliminary result)   Collection Time: 01/03/15 11:25 PM  Result Value Ref Range Status   Specimen Description BLOOD LEFT WRIST  Final   Special Requests IN PEDIATRIC BOTTLE 2.5 CC  Final   Culture   Final    NO GROWTH 4 DAYS Performed at [facility]    Report Status PENDING  Incomplete  Culture, Urine     Status: None   Collection Time: 01/03/15 11:29 PM  Result Value Ref Range Status   Specimen Description URINE, CATHETERIZED  Final   Special Requests Normal  Final   Culture   Final    >=100,000 COLONIES/mL ENTEROCOCCUS SPECIES >=100,000  COLONIES/mL YEAST Performed at [facility]    Report Status 01/07/2015 FINAL  Final   Organism ID, Bacteria ENTEROCOCCUS SPECIES  Final      Susceptibility   Enterococcus species - MIC*    AMPICILLIN >=32 RESISTANT Resistant     LEVOFLOXACIN >=8 RESISTANT Resistant     NITROFURANTOIN 32 SENSITIVE Sensitive     VANCOMYCIN 1 SENSITIVE Sensitive     * >=100,000 COLONIES/mL ENTEROCOCCUS SPECIES  Culture, respiratory (NON-Expectorated)     Status: None   Collection Time: 01/04/15 12:36 AM  Result Value Ref Range Status   Specimen Description TRACHEAL ASPIRATE  Final   Special Requests Normal  Final   Gram Stain   Final    FEW WBC PRESENT, PREDOMINANTLY PMN RARE SQUAMOUS EPITHELIAL CELLS PRESENT ABUNDANT GRAM NEGATIVE COCCOBACILLI MODERATE GRAM POSITIVE COCCI IN PAIRS AND CHAINS FEW YEAST    Culture   Final    ENTEROBACTER CLOACAE Performed at [facility]    Report Status 01/09/2015 FINAL  Final   Organism ID, Bacteria ENTEROBACTER CLOACAE  Final      Susceptibility   Enterobacter cloacae - MIC*    CEFAZOLIN >=64 RESISTANT Resistant     CEFEPIME <=1 SENSITIVE Sensitive     CEFTAZIDIME >=64 RESISTANT Resistant     CEFTRIAXONE >=64 RESISTANT Resistant     CIPROFLOXACIN <=0.25 SENSITIVE Sensitive     GENTAMICIN <=1 SENSITIVE Sensitive     IMIPENEM 1 SENSITIVE Sensitive     PIP/TAZO >=128 RESISTANT Resistant     TOBRAMYCIN <=1 SENSITIVE  Sensitive     TRIMETH/SULFA <=20 SENSITIVE Sensitive     AZTREONAM >=64 RESISTANT Resistant     * ENTEROBACTER CLOACAE  Culture, respiratory (NON-Expectorated)     Status: None (Preliminary result)   Collection Time: 01/08/15  9:40 AM  Result Value Ref Range Status   Specimen Description TRACHEAL ASPIRATE  Final   Special Requests Normal  Final   Gram Stain PENDING  Incomplete   Culture   Final    Culture reincubated for better growth Performed at [facility]    Report Status PENDING  Incomplete     Anti-infectives    Start     Dose/Rate Route Frequency Ordered Stop   01/08/15 1315  fluconazole (DIFLUCAN) IVPB 400 mg     400 mg 100 mL/hr over 120 Minutes Intravenous Daily 01/08/15 1256     01/08/15 1200  linezolid (ZYVOX) IVPB 600 mg     600 mg 300 mL/hr over 60 Minutes Intravenous Every 12 hours 01/08/15 1051     01/07/15 1400  aztreonam (AZACTAM) 1 g in dextrose 5 % 50 mL IVPB     1 g 100 mL/hr over 30 Minutes Intravenous 3 times per day 01/07/15 0812     01/06/15 1400  aztreonam (AZACTAM) 500 mg in dextrose 5 % 50 mL IVPB  Status:  Discontinued     500 mg 100 mL/hr over 30 Minutes Intravenous 3 times per day 01/06/15 0739 01/07/15 0812   01/04/15 0600  aztreonam (AZACTAM) 1 g in dextrose 5 % 50 mL IVPB  Status:  Discontinued     1 g 100 mL/hr over 30 Minutes Intravenous 3 times per day 01/03/15 2255 01/06/15 0739   01/03/15 2300  linezolid (ZYVOX) IVPB 600 mg  Status:  Discontinued     600 mg 300 mL/hr over 60 Minutes Intravenous Every 12 hours 01/03/15 2248 01/05/15 1039   01/03/15 2300  metroNIDAZOLE (FLAGYL) IVPB 500 mg     500 mg 100 mL/hr over 60 Minutes Intravenous 3 times per day 01/03/15 2248     01/03/15 2300  aztreonam (AZACTAM) 2 g in dextrose 5 % 50 mL IVPB     2 g 100 mL/hr over 30 Minutes Intravenous  Once 01/03/15 2254 01/04/15 0003   12/24/14 1800  tobramycin (NEBCIN) 400 mg in dextrose 5 % 100 mL IVPB     7 mg/kg  57 kg (Ideal) 110 mL/hr over 60 Minutes Intravenous  Once 12/24/14 1710 12/24/14 1842   12/23/14 1345  cefTAZidime (FORTAZ) 2 g in dextrose 5 % 50 mL IVPB  Status:  Discontinued     2 g 100 mL/hr over 30 Minutes Intravenous Every 12 hours 12/23/14 1340 12/31/14 1044   01/10/2015 2000  linezolid (ZYVOX) IVPB 600 mg  Status:  Discontinued     600 mg 300 mL/hr over 60 Minutes Intravenous Every 12 hours 12/23/2014 1821 12/26/14 1545   12/19/2014 1845  cefTAZidime (FORTAZ) 2 g in dextrose 5 % 50 mL IVPB  Status:  Discontinued     2 g 100 mL/hr over  30 Minutes Intravenous Daily 12/25/2014 1839 12/23/14 1340   01/03/2015 1745  levofloxacin (LEVAQUIN) IVPB 750 mg  Status:  Discontinued     750 mg 100 mL/hr over 90 Minutes Intravenous  Once 01/08/2015 1744 12/31/2014 1820      Assessment: 87 yoF on CRRT (CVVHDF) known from previous aztreonam dosing.  Now with yeast (and enterococcus) in urine.  Recovered from WBC spike after cardiac arrest; now trending  up again. CCM restarting Zyvox; pharmacy to dose fluconazole  emp: afebrile, few low temps, prob d/t CRRT WBC: Spike to 45 on 9/17; resolved.  Now trending back up (did get solumedrol x3 2 days ago) Renal: started on CRRT with worsening renal function; still negligible UOP CXR 9/21: unchanged dense diffuse bilat infiltrates  9/5 >> Ceftazidime >> 9/14 9/5 >> Zyvox >> 9/9; restart 9/17 >> 9/19; restart 9/22 >> 9/7 >> Tobramycin >> 9/8 9/17 >> aztreonam >> 9/23 9/17 >> linezolid (MD) >> 9/19 9/17 >> metronidazole (MD) >> 9/22 >> fluconazole >> 9/23 >> Primaxin >>  9/5 blood: viridans strep 1/2 9/5 Cidff Ag +, Toxin - 9/5 MRSA PCR: positive 9/5 Strep pneumo Ur Ag: negative 9/5 Legionella UAg: neg 9/7 BAL: candida albicans (not relevant per CCM); AFB pending 9/13 pleural fluid cx: NG-Final 9/13 blood x 2: NG-final 9/17 blood x2:  ngtd 9/17 Urine: > 100k enterococcus, R- Amp, Lvq; S- NTF, Vanc; >100k yeast 9/18 trach aspirate: Enterobacter, Aztreonam,Cephalosporin, Zosyn resistant; Cefepime, Quinolone, Primaxin sensitive 9/22 trach aspirate: ngtd  Goal of Therapy:  Eradication of infection Appropriate antibiotic dosing for indication and renal function  Plan:  Day 7 antibiotics from 9/17  Fluconazole 400 mg IV q24 hr while on CRRT  Discontinue aztreonam, begin Primaxin 500mg  q6hr while on CRRT  Follow clinical course, renal function, culture results as available  Follow for de-escalation of antibiotics and LOT  Minda Ditto PharmD Pager [phone] 01/09/2015, 9:43  AM

## 2015-01-09 NOTE — Progress Notes (Signed)
[facility] Progress Note   Subjective: net negative 1.6 L only yesterday. Pressor dose increasing, WBC increasing to 29k today.   Filed Vitals:   01/09/15 1200 01/09/15 1300 01/09/15 1400 01/09/15 1500  BP: 150/78 77/46 113/62 103/59  Pulse: 106  103 94  Temp: 97.8 F (36.6 C)     TempSrc: Oral     Resp: 17 19 19 17   Height:      Weight:      SpO2: 95%  100% 100%   Exam: Small framed WF on vent, responsive No rash, cyanosis or gangrene No JVD Chest scattered rhonchi RRR no MRG Abd soft ntnd large mid-abd scar healed over, RLQ ostomy w dark brown contents in bag 2+ LE edema bilat Neuro responds to voice   Assessment: 1 Acute kidney injury - ATN due to shock/ cardiac arrest. D#4 CRRT. Unable to pull much volume due to shock/ hypotension.  2 Septic shock - ^'ing pressor dosing, MS worse, labile BP's 3 VDRF/ trach 4 Vol overload- minor decrease in wts.  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 Prognosis worsening as she is continues to not improve. Had discussion w son that we are doing everything we can. Still will support full code as this was pt's wishes a couple of days ago when she was coherent.  He understands she is at high risk for arrest and is grateful for the efforts of the staff.     Kelly Splinter MD  pager [phone]    cell [phone]  01/09/2015, 3:51 PM     Recent Labs Lab 01/08/15 1650  01/08/15 2030  01/09/15 0422 01/09/15 0429 01/09/15 0440  NA 127*  < > 131*  < > 132* 129* 132*  K 3.5  < > 4.3  < > 3.4* 3.4* 3.5  CL 89*  < > 94*  < > 88* 85* 91*  CO2 28  --  21*  --   --   --  29  GLUCOSE 284*  < > 106*  < > 199* 146* 146*  BUN 24*  < > 24*  < > 12 16 18   CREATININE 1.06*  < > 1.15*  < > 0.60  1.00 0.83  CALCIUM 14.5*  --  9.9  --   --   --  10.3  PHOS 2.5  --  2.8  --   --   --  1.8*  < > = values in this interval not displayed.  Recent Labs Lab 01/03/15 1020  01/08/15 1650 01/08/15 2030 01/09/15 0440  AST 30  --   --   --   --   ALT 13*  --   --   --   --   ALKPHOS 163*  --   --   --   --   BILITOT 0.8  --   --   --   --   PROT 4.7*  --   --   --   --   ALBUMIN 2.2*  < > 1.7* 1.8* 1.7*  < > = values in this interval not displayed.  Recent Labs Lab 01/07/15 0640  01/08/15 0525  01/09/15 0422 01/09/15 0429 01/09/15 0440  WBC 10.3  --  19.5*  --   --   --  29.4*  NEUTROABS 6.8  --  10.8*  --   --   --  20.5*  HGB 7.8*  < > 9.0*  < > 9.2* 10.2* 8.3*  HCT 23.2*  < > 27.5*  < > 27.0* 30.0* 25.0*  MCV 85.3  --  86.8  --   --   --  87.7  PLT 235  --  305  --   --   --  287  < > = values in this interval not displayed. Marland Kitchen antiseptic oral rinse  7 mL Mouth Rinse QID  . arformoterol  15 mcg Nebulization BID  . budesonide (PULMICORT) nebulizer solution  0.5 mg Nebulization BID  . chlorhexidine gluconate  15 mL Mouth Rinse BID  . etomidate  40 mg Intravenous Once  . feeding supplement (NEPRO CARB STEADY)  1,000 mL Oral Q24H  . feeding supplement (PRO-STAT SUGAR FREE 64)  30 mL Per Tube BID  . fluconazole (DIFLUCAN) IV  400 mg Intravenous Q1400  . imipenem-cilastatin  500 mg Intravenous Q6H  . linezolid  600 mg Intravenous Q12H  . metronidazole  500 mg Intravenous 3 times per day  . pantoprazole (PROTONIX) IV  40 mg Intravenous Q24H  . promethazine  6.25 mg Intravenous Once  . sodium chloride  10-40 mL Intracatheter Q12H  . vecuronium  10 mg Intravenous Once   . calcium gluconate infusion for CRRT 20 g (01/09/15 0957)  . dextrose 50 mL/hr at 01/09/15 1300  . fentaNYL infusion INTRAVENOUS 100 mcg/hr (01/09/15 1340)  . phenylephrine (NEO-SYNEPHRINE) Adult infusion 60 mcg/min (01/09/15 1358)  . dialysis replacement fluid (prismasate) Stopped (01/07/15 1445)  .  dialysis replacement fluid (prismasate) 200 mL/hr at 01/08/15 1818  . dialysate (PRISMASATE) 1,800 mL/hr at 01/09/15 1437  . sodium citrate 2 %/dextrose 2.5% solution 3000 mL 250 mL/hr at 01/09/15 0945   albuterol, albuterol, alteplase, fentaNYL (SUBLIMAZE) injection, heparin, metoprolol, [DISCONTINUED] ondansetron **OR** ondansetron (ZOFRAN) IV, sodium chloride, sodium chloride

## 2015-01-09 NOTE — Progress Notes (Signed)
PULMONARY / CRITICAL CARE MEDICINE   Name: [name] MRN: [id number] DOB: Nov 22, 1950    ADMISSION DATE:  12/30/2014 CONSULTATION DATE:  12/23/2014  REFERRING MD :  Dr. Karleen Hampshire   CHIEF COMPLAINT:  Shortness of breath   INITIAL PRESENTATION:  64 yo female smoker with dyspnea, VDRF from pneumococcal PNA and septic shock.  She has hx of HTN, GERD, PAF, CMML, COPD.  Prolonged hospital course complicated by respiratory failure in the setting of PNA initially, then pulmonary edema w AF RVR, failed extubation, bradycardic arrest, anemia, abdominal pain with spontaneous bleeding into fluid collections / intraperitoneal hemorrhage on CT abd and acute renal failure requiring CVVHD.   STUDIES:  9/05  CT Chest w/o >> diffuse bilateral airspace disease, consolidation involving all lobes of L/R lung, several discrete nodules present in upper lobes, bulky mediastinal adenopathy 9/16  CT Abd/Pelvis >> fluid collections noted along the R anterior / lateral abdominal wall extending from the RUQ to the R lower lateral abdomen above the iliac crest - ? Seromas from prior hemorrhage with hx of anemia, ? Infected fluid collections, measurement shows these collections to be the density of water.  Anasarca with ascites, small pleural effusions, atx in lower lobes, partial atx of RML, mild splenomegaly 9/17 TTE >> LV normal in size. Moderate concentric LVH. Appearance consistent with hypertrophic cardiomyopathy. EF 80-85%. No regional wall motion abnormalities. Grade 1 diastolic dysfunction. Dynamic obstruction. No significant change from prior echocardiogram except for increased EF. No pericardial effusion. IVC normal in size. Pulmonary artery systolic pressure normal. RV normal in size and function. 9/20  CT Abd/Pelvis >> interval hemorrhage into previously described large fluid collections within the right anterior / lateral abdominal wall, diffuse anasarca, moderate ascites, pulmonary consolidation within the bilateral  lower lobes with adjacent small effusions, mild splenomegaly  SIGNIFICANT EVENTS: 9/05  Admit with worsening SOB  9/06  intubated for hypoxia while receiving fluid bolus ? Edema, levo gtt 9/10  Extubated, transfuse 1 unit PRBC 9/11  A fib with RVR >> started amiodarone/levophed, respiratory failure >> re-intubated 9/12  mottled (hx of Raynauds) ECHO pending. Appropriate. Scr bumped.  9/13  Diagnostic/Therapeutic right Thoracentesis. 650 ML clear appearing. Transudate by [facility]  9/14  lasix for neg volume, weaning.  9/15  lasix and albumin. Passed SBT but had sig WOB and tachycardia.  9/16  failed SBT. Still positive volume in spite of lasix. Still on low dose pressors but may be r/t diprivan. Albumin/lasix x 3 doses. Family updated. Will likely need trach. Anemic. Getting 2 units blood.  CT of abd with fluid collection that appears to be the density of water 9/17  Bradycardic arrest after N/V & probable aspiration.  ACLS. 9/19  Anemia, tx 2 units.  Repeat CT found bleeding into fluid collection, hypoglycemia, AMS with pH 6.9, vent changes / changed vent out and repeat 7.4 9/20  Anemia noted overnight, transfused one unit PRBC, hypoglycemia, period of AMS with acidosis, vent changed out and rate adjusted with improvement.  Off levophed.  CVVHD started.  9/22  Trach per DF  SUBJECTIVE:   Very uncomfortable. Sedated or in pain   VITAL SIGNS: Temp:  [97.8 F (36.6 C)-98.4 F (36.9 C)] 97.8 F (36.6 C) (09/23 0730) Pulse Rate:  [36-124] 97 (09/23 0800) Resp:  [15-22] 19 (09/23 0800) BP: (72-187)/(36-121) 105/67 mmHg (09/23 0800) SpO2:  [93 %-100 %] 99 % (09/23 0800) FiO2 (%):  [40 %-100 %] 40 % (09/23 0800) Weight:  [176 lb 9.4 oz (80.1  kg)] 176 lb 9.4 oz (80.1 kg) (09/23 0330)      VENTILATOR SETTINGS: Vent Mode:  [-] PSV FiO2 (%):  [40 %-100 %] 40 % Set Rate:  [20 bmp] 20 bmp Vt Set:  [500 mL] 500 mL PEEP:  [5 cmH20] 5 cmH20 Pressure Support:  [5 cmH20] 5 cmH20 Plateau  Pressure:  [17 cmH20-21 cmH20] 17 cmH20   INTAKE / OUTPUT:  Intake/Output Summary (Last 24 hours) at 01/09/15 0901 Last data filed at 01/09/15 0900  Gross per 24 hour  Intake 7854.36 ml  Output   9510 ml  Net -1655.64 ml    PHYSICAL EXAMINATION: General:  Chronically ill adult female in NAD. Arouses to voice, grimaces with any stimulation. Integument:  Warm & dry. No rash on exposed skin. Mottling & cyanosis of bilateral upper extremity & lower extremity digits.  Ecchymosis on R flank. Rt upper ext with PICC/ + edema but no evidence of infection. HEENT:  No scleral injection or icterus.Trach in place, no bleeding. PERRL. Cardiovascular:  s1s2 rrr, no m/r/g.  Anasarca + Pulmonary:  Coarse breath sounds bilaterally, rhonchi. Symmetric chest wall rise on ventilator. Abdomen: Soft. Normal bowel sounds. Nondistended. Right-sided ostomy in place with drainage. Neurological:  Moving all 4 extremities equally. Cranial nerves appear grossly intact. Nods to questions, attempts to communicate. Appears to be in pain  LABS:  CBC  Recent Labs Lab 01/07/15 0640  01/08/15 0525  01/09/15 0422 01/09/15 0429 01/09/15 0440  WBC 10.3  --  19.5*  --   --   --  29.4*  HGB 7.8*  < > 9.0*  < > 9.2* 10.2* 8.3*  HCT 23.2*  < > 27.5*  < > 27.0* 30.0* 25.0*  PLT 235  --  305  --   --   --  287  < > = values in this interval not displayed. BMET  Recent Labs Lab 01/08/15 1650  01/08/15 2030  01/09/15 0422 01/09/15 0429 01/09/15 0440  NA 127*  < > 131*  < > 132* 129* 132*  K 3.5  < > 4.3  < > 3.4* 3.4* 3.5  CL 89*  < > 94*  < > 88* 85* 91*  CO2 28  --  21*  --   --   --  29  BUN 24*  < > 24*  < > 12 16 18   CREATININE 1.06*  < > 1.15*  < > 0.60 1.00 0.83  GLUCOSE 284*  < > 106*  < > 199* 146* 146*  < > = values in this interval not displayed.   Electrolytes  Recent Labs Lab 01/07/15 0640  01/08/15 0525 01/08/15 1650 01/08/15 2030 01/09/15 0440  CALCIUM 7.4*  < > 8.0* 14.5* 9.9 10.3   MG 2.1  --  1.9  --   --  1.6*  PHOS 6.2*  < > 2.9 2.5 2.8 1.8*  < > = values in this interval not displayed.   Sepsis Markers  Recent Labs Lab 01/02/15 2320 01/03/15 1000 01/03/15 1615 01/03/15 2100 01/04/15 0500 01/05/15 0434  LATICACIDVEN 0.8 5.7* 1.6  --   --   --   PROCALCITON  --   --   --  26.94 29.42 24.77   ABG  Recent Labs Lab 01/05/15 2035 01/05/15 2335 01/07/15 1123  PHART 6.992* 7.414 7.352  PCO2ART 85.4* 27.4* 37.7  PO2ART 89.4 115* 68.4*    Liver Enzymes  Recent Labs Lab 01/03/15 1020  01/08/15 1650 01/08/15 2030 01/09/15 0440  AST 30  --   --   --   --   ALT 13*  --   --   --   --   ALKPHOS 163*  --   --   --   --   BILITOT 0.8  --   --   --   --   ALBUMIN 2.2*  < > 1.7* 1.8* 1.7*  < > = values in this interval not displayed.   Cardiac Enzymes  Recent Labs Lab 01/03/15 1020 01/03/15 1615 01/03/15 2100  TROPONINI 0.04* 0.41* 0.33*    Glucose  Recent Labs Lab 01/08/15 0916 01/08/15 1134 01/08/15 1552 01/09/15 0023 01/09/15 0420 01/09/15 0435  GLUCAP 127* 146* 138* 172* 193* 140*    Imaging Dg Abd 1 View  01/08/2015   CLINICAL DATA:  Feeding tube placement.  EXAM: ABDOMEN - 1 VIEW  COMPARISON:  Earlier today.  FINDINGS: Feeding tube tip in the distal stomach. Normal bowel gas pattern. Lumbar spine degenerative changes and mild scoliosis. Left hip fixation hardware.  IMPRESSION: Feeding tube tip in the distal stomach.   Electronically Signed   By: Claudie Revering M.D.   On: 01/08/2015 18:15   Dg Abd 1 View  01/08/2015   CLINICAL DATA:  Feeding tube placement.  EXAM: ABDOMEN - 1 VIEW  COMPARISON:  01/05/2015  FINDINGS: A small bore feeding tube is noted with tip overlying the mid stomach.  The bowel gas pattern is unremarkable.  Bilateral pleural effusions and bibasilar atelectasis again noted.  No acute bony abnormalities are identified.  IMPRESSION: Small bore feeding tube with tip overlying the mid stomach.   Electronically Signed    By: Margarette Canada M.D.   On: 01/08/2015 12:56   Dg Chest Port 1 View  01/09/2015   CLINICAL DATA:  Respiratory failure.  EXAM: PORTABLE CHEST 1 VIEW  COMPARISON:  01/08/2015.  FINDINGS: Tracheostomy tube, feeding tube, bilateral IJ lines in stable position. Heart size stable. Diffuse bilateral pulmonary infiltrates and or edema present and unchanged. Bilateral pleural effusions unchanged. No pneumothorax.  IMPRESSION: 1. Lines and tubes stable position. 2. Diffuse bilateral pulmonary infiltrates and/or pulmonary edema and bilateral pleural effusions unchanged. 3. Stable cardiomegaly .   Electronically Signed   By: Marcello Moores  Register   On: 01/09/2015 07:16   Dg Chest Port 1 View  01/08/2015   CLINICAL DATA:  Acute respiratory failure with hypoxemia.  EXAM: PORTABLE CHEST - 1 VIEW  COMPARISON:  January 07, 2015.  FINDINGS: Endotracheal tube has been removed, replaced with tracheostomy tube which is in grossly good position. Right-sided PICC line is noted with distal tip in expected position of the SVC. Left internal jugular dialysis catheter is noted with distal tip in the expected position of the SVC. Stable interstitial densities are noted in both lungs concerning for edema or possibly pneumonia. Stable bilateral pleural effusions are noted with probable underlying atelectasis. No pneumothorax is noted.  IMPRESSION: Tracheostomy tube in grossly good position. Other support apparatus are stable. Stable bilateral interstitial densities are noted concerning for edema or possibly inflammation. Stable moderate bilateral pleural effusions are noted with probable underlying atelectasis.   Electronically Signed   By: Marijo Conception, M.D.   On: 01/08/2015 09:33    ASSESSMENT / PLAN:  PULMONARY ETT 9/06 >> 9/10 ETT 9/11 >> 9/22 Trach 9/22 >>   A: Acute Hypoxic Respiratory Failure - Multifactorial from Pneumonia, Volume Overload & acute aspiration event (9/17) Pulmonary Edema  Pink/Frothy Sputum -  resolved Bronchospasm  Bilateral Pleural Effusions - S/P Thoracentesis/Transudate. Presumed COPD - H/O Tobacco Use. Hypercarbia - 9/19 ABG 6.9, pco2 85  P:   Trached 9/22 SBT as tolerated  Scheduled Duonebs Budesonide nebs Wean PEEP / FiO2 for sats > 90% Intermittent CXR Adjust vent rate to 20 Negative balance with CVVHD as tolerated  CARDIOVASCULAR Rt PICC 9/06 >> A:  Bradycardic Arrest 9/17 - Likely due to aspiration. A-Fib with RVR- converted to NSR. Currently ST.  Shock - Likely ongoing due to sedation, On neo drip 9/23 with sedation contributing to hypotension. H/O hypertension  P:  Telemetry monitoring  Holding ASA SCD's only with bleeding Wean sedation as tolerated  RENAL A:   Acute Renal Failure - worsening with anuria 9/20. Hypervolemia - due to hypoalbuminemia Metabolic acidosis  Hypokalemia - treat Hypomagnesemia -treat. Hyponatremia -treat  P:   Trend BMP / Hilbert  Nephrology following, appreciate input Replace electrolytes as indicated  CVVHD per Nephrology   GASTROINTESTINAL A:   Abdominal Hemorrhage into Fluid Collections on R / Intraperitoneal Hemorrhage Chronic diarrhea with C diff colonization Hypoalbuminemia  N/V - Holding tube feedings Hx of GERD  P:   Resume TF 9/21, trickle feeding and assess how patient tolerates.  Increase slowly with 20 cc 9/23 Protonix IV daily Zofran IV prn See ID Do not think she would be a surgical candidate.  Discussed with general surgery as well and not an operative candidate due to multiple prior belly surgeries & poor overall health  HEMATOLOGIC  Recent Labs  01/09/15 0429 01/09/15 0440  HGB 10.2* 8.3*    A:   Anemia - No signs of active bleeding on CT. S/P 2u PRBC 9/16. & 1u PRBC 9/17. Hx of CMML with chronic anemia.  P:  Trend Hgb daily w/ CBC SCDs Transfuse for Hgb <7 Minimize blood draws as able   INFECTIOUS A:   Septic shock - Secondary to Pneumonia & questionable  bacteremia. Questionable Streptococcal Pneumonia - Completed Fortaz 9/14 Streptococcal Viridans Bacteremia - Completed Fortaz 9/14  Cdiff colonization Enterobacter C resistant 9/23 changed to primaxin  P:   Consider ID consult  Linezolid 9/17 >> 9/19 Aztreonam 9/17 >> 9/23 Flagyl IV 9/17 >> Primaxin 9/23>>  Blood Ctx 9/17 >> Urine Ctx 9/17 >> enterococcus >> sens vanco Resp Ctx 9/17 >> enterobacter cloacae >>resistant to aztreonam Resp cx 9/22>> Blood Ctx 9/13 >> negative  R Pleural Effusion 9/13 >> negative  BAL 9/7>>Bacteria Neg/AFB pending/Fungal pending  Blood Ctx 9/5 >> 1/2 positive for Strep Viridans Urine Strep Ag 9/5 >> negative Urine Leg Ag 9/5 >> negative  ENDOCRINE CBG (last 3)   Recent Labs  01/09/15 0023 01/09/15 0420 01/09/15 0435  GLUCAP 172* 193* 140*     A: Hyperglycemia  P: Monitor BG & RN to notify for <90 or >160 D10 @ 50 ml/hr, continue until we can see if patient tolerates TF. TF currently at 15, ileostomy with drainage. Therefore increase TF to 20, continue D50 for now.  NEUROLOGY A: Chest Pain post resuscitation H/O Chronic pain Depression Vent tolearance  P: RASS goal: 0 to -1 Fentanyl IV prn + diprivan. Consider weaning sedation during day and work on weaning.    TODAYS SUMMARY:  64 year old female with atrial fibrillation and pneumonia with repeat intubation due to hypoxic respiratory failure. Patient has had failure to wean likely owing to pulmonary edema from hypoalbuminemic state. Patient suffered bradycardic arrest on 9/17 after episode of emesis. Patient had no evidence of bowel obstruction or residual tube feeds  prior to this event. With elevation of Procalcitonin and no obvious source for shock otherwise in the face of leukocytosis broad-spectrum antibiotics were restarted after repeating cultures. Hgb dropped overnight 9/19.  Repeat CT abd showed bleeding into prior fluid collections. CVVHD initiated on 9/20.  Clinical  prognosis remains guarded given multiple medical problems. Family very involved in patients care.  Continue to request full code status.   Trach performed 9/22. 9/23 she continues on trach/vent. Constant pain is an issue. Continue current care, code status may need revisit in near future.  Richardson Landry Minor ACNP [facility] Shape PCCM Pager [phone] till 3 pm If no answer page [phone] 01/09/2015, 9:03 AM

## 2015-01-09 NOTE — Progress Notes (Signed)
Patient experiencing multiple short periods of tachycardia into the 150's (approximately 1-2 seconds at a time), an irregular heart rate, and occasional irregular QRS complexes noted on the monitor . Each noted episode thus far resolving without intervention. An EKG was performed and MD on call notified via [facility]. Will continue to monitor. Luther Parody, RN

## 2015-01-09 NOTE — Progress Notes (Signed)
eLink Physician-Brief Progress Note Patient Name: [name] DOB: 1950/08/10 MRN: [id number]   Date of Service  01/09/2015  HPI/Events of Note  AFIB with RVR. Ventricular rate 120's to 150's. BP = 78/58. Phenylephrine being titrated up. K+ = 3.8. Has been in chronic AFIB.  eICU Interventions  Will order: 1. Amiodarone IV bolus and infusion.  2. Magnesium level now.      Intervention Category Major Interventions: Arrhythmia - evaluation and management  Sommer,Steven Cornelia Copa 01/09/2015, 11:44 PM

## 2015-01-09 NOTE — Progress Notes (Addendum)
RT bagged and lavaged patient as per day RT . Patient tolerated well. RT will continue to monitor.

## 2015-01-09 NOTE — Progress Notes (Signed)
eLink Physician-Brief Progress Note Patient Name: [name] DOB: 1950/10/24 MRN: [id number]   Date of Service  01/09/2015  HPI/Events of Note  Hypokalemia  eICU Interventions  Potassium replaced     Intervention Category Intermediate Interventions: Electrolyte abnormality - evaluation and management  DETERDING,ELIZABETH 01/09/2015, 2:31 AM

## 2015-01-09 NOTE — Progress Notes (Signed)
CRRT numbers have been documented and verified with Jearld Lesch., RN. Gildardo Cranker, RN 01/09/2015 5:10 PM

## 2015-01-09 NOTE — Progress Notes (Signed)
eLink Physician-Brief Progress Note Patient Name: Anastasija Anfinson DOB: [birth year] MRN: [id number]   Date of Service  01/09/2015  HPI/Events of Note  Hypokalemia, hypomag, hypophos  eICU Interventions  Potassium, mag and phos replaced     Intervention Category Intermediate Interventions: Electrolyte abnormality - evaluation and management  DETERDING,ELIZABETH 01/09/2015, 5:33 AM

## 2015-01-10 ENCOUNTER — Inpatient Hospital Stay (HOSPITAL_COMMUNITY): Payer: 59

## 2015-01-10 DIAGNOSIS — M7989 Other specified soft tissue disorders: Secondary | ICD-10-CM

## 2015-01-10 LAB — POCT I-STAT, CHEM 8
BUN: 11 mg/dL (ref 6–20)
BUN: 8 mg/dL (ref 6–20)
BUN: 8 mg/dL (ref 6–20)
BUN: 11 mg/dL (ref 6–20)
BUN: 12 mg/dL (ref 6–20)
BUN: 8 mg/dL (ref 6–20)
BUN: 10 mg/dL (ref 6–20)
BUN: 8 mg/dL (ref 6–20)
BUN: 10 mg/dL (ref 6–20)
BUN: 8 mg/dL (ref 6–20)
BUN: 10 mg/dL (ref 6–20)
BUN: 7 mg/dL (ref 6–20)
BUN: 7 mg/dL (ref 6–20)
BUN: 9 mg/dL (ref 6–20)
BUN: 7 mg/dL (ref 6–20)
BUN: 9 mg/dL (ref 6–20)
BUN: 7 mg/dL (ref 6–20)
BUN: 8 mg/dL (ref 6–20)
CALCIUM ION: 0.45 mmol/L — AB (ref 1.13–1.30)
CALCIUM ION: 1.21 mmol/L (ref 1.13–1.30)
CALCIUM ION: 0.41 mmol/L — AB (ref 1.13–1.30)
CHLORIDE: 85 mmol/L — AB (ref 101–111)
CHLORIDE: 83 mmol/L — AB (ref 101–111)
CHLORIDE: 83 mmol/L — AB (ref 101–111)
CHLORIDE: 84 mmol/L — AB (ref 101–111)
CHLORIDE: 82 mmol/L — AB (ref 101–111)
CHLORIDE: 85 mmol/L — AB (ref 101–111)
CHLORIDE: 83 mmol/L — AB (ref 101–111)
CHLORIDE: 78 mmol/L — AB (ref 101–111)
CREATININE: 0.7 mg/dL (ref 0.44–1.00)
CREATININE: 0.6 mg/dL (ref 0.44–1.00)
CREATININE: 0.7 mg/dL (ref 0.44–1.00)
CREATININE: 0.5 mg/dL (ref 0.44–1.00)
CREATININE: 0.8 mg/dL (ref 0.44–1.00)
CREATININE: 0.7 mg/dL (ref 0.44–1.00)
CREATININE: 0.7 mg/dL (ref 0.44–1.00)
Calcium, Ion: 1.18 mmol/L (ref 1.13–1.30)
Calcium, Ion: 0.46 mmol/L — CL (ref 1.13–1.30)
Calcium, Ion: 1.17 mmol/L (ref 1.13–1.30)
Calcium, Ion: 0.46 mmol/L — CL (ref 1.13–1.30)
Calcium, Ion: 1.21 mmol/L (ref 1.13–1.30)
Calcium, Ion: 0.45 mmol/L — CL (ref 1.13–1.30)
Calcium, Ion: 1.21 mmol/L (ref 1.13–1.30)
Calcium, Ion: 0.49 mmol/L — CL (ref 1.13–1.30)
Calcium, Ion: 0.42 mmol/L — CL (ref 1.13–1.30)
Calcium, Ion: 1.17 mmol/L (ref 1.13–1.30)
Calcium, Ion: 0.42 mmol/L — CL (ref 1.13–1.30)
Calcium, Ion: 1.17 mmol/L (ref 1.13–1.30)
Calcium, Ion: 1.16 mmol/L (ref 1.13–1.30)
Calcium, Ion: 0.41 mmol/L — CL (ref 1.13–1.30)
Calcium, Ion: 1.02 mmol/L — ABNORMAL LOW (ref 1.13–1.30)
Chloride: 85 mmol/L — ABNORMAL LOW (ref 101–111)
Chloride: 87 mmol/L — ABNORMAL LOW (ref 101–111)
Chloride: 86 mmol/L — ABNORMAL LOW (ref 101–111)
Chloride: 85 mmol/L — ABNORMAL LOW (ref 101–111)
Chloride: 86 mmol/L — ABNORMAL LOW (ref 101–111)
Chloride: 84 mmol/L — ABNORMAL LOW (ref 101–111)
Chloride: 82 mmol/L — ABNORMAL LOW (ref 101–111)
Chloride: 82 mmol/L — ABNORMAL LOW (ref 101–111)
Chloride: 84 mmol/L — ABNORMAL LOW (ref 101–111)
Chloride: 82 mmol/L — ABNORMAL LOW (ref 101–111)
Creatinine, Ser: 0.5 mg/dL (ref 0.44–1.00)
Creatinine, Ser: 0.6 mg/dL (ref 0.44–1.00)
Creatinine, Ser: 0.8 mg/dL (ref 0.44–1.00)
Creatinine, Ser: 0.5 mg/dL (ref 0.44–1.00)
Creatinine, Ser: 0.7 mg/dL (ref 0.44–1.00)
Creatinine, Ser: 0.6 mg/dL (ref 0.44–1.00)
Creatinine, Ser: 0.7 mg/dL (ref 0.44–1.00)
Creatinine, Ser: 0.6 mg/dL (ref 0.44–1.00)
Creatinine, Ser: 0.6 mg/dL (ref 0.44–1.00)
Creatinine, Ser: 0.6 mg/dL (ref 0.44–1.00)
Creatinine, Ser: 0.6 mg/dL (ref 0.44–1.00)
GLUCOSE: 104 mg/dL — AB (ref 65–99)
GLUCOSE: 169 mg/dL — AB (ref 65–99)
GLUCOSE: 158 mg/dL — AB (ref 65–99)
GLUCOSE: 188 mg/dL — AB (ref 65–99)
GLUCOSE: 126 mg/dL — AB (ref 65–99)
GLUCOSE: 436 mg/dL — AB (ref 65–99)
Glucose, Bld: 175 mg/dL — ABNORMAL HIGH (ref 65–99)
Glucose, Bld: 112 mg/dL — ABNORMAL HIGH (ref 65–99)
Glucose, Bld: 118 mg/dL — ABNORMAL HIGH (ref 65–99)
Glucose, Bld: 180 mg/dL — ABNORMAL HIGH (ref 65–99)
Glucose, Bld: 191 mg/dL — ABNORMAL HIGH (ref 65–99)
Glucose, Bld: 130 mg/dL — ABNORMAL HIGH (ref 65–99)
Glucose, Bld: 183 mg/dL — ABNORMAL HIGH (ref 65–99)
Glucose, Bld: 138 mg/dL — ABNORMAL HIGH (ref 65–99)
Glucose, Bld: 202 mg/dL — ABNORMAL HIGH (ref 65–99)
Glucose, Bld: 124 mg/dL — ABNORMAL HIGH (ref 65–99)
Glucose, Bld: 203 mg/dL — ABNORMAL HIGH (ref 65–99)
Glucose, Bld: 214 mg/dL — ABNORMAL HIGH (ref 65–99)
HCT: 24 % — ABNORMAL LOW (ref 36.0–46.0)
HCT: 24 % — ABNORMAL LOW (ref 36.0–46.0)
HCT: 25 % — ABNORMAL LOW (ref 36.0–46.0)
HCT: 24 % — ABNORMAL LOW (ref 36.0–46.0)
HCT: 22 % — ABNORMAL LOW (ref 36.0–46.0)
HCT: 21 % — ABNORMAL LOW (ref 36.0–46.0)
HCT: 24 % — ABNORMAL LOW (ref 36.0–46.0)
HCT: 23 % — ABNORMAL LOW (ref 36.0–46.0)
HEMATOCRIT: 23 % — AB (ref 36.0–46.0)
HEMATOCRIT: 22 % — AB (ref 36.0–46.0)
HEMATOCRIT: 24 % — AB (ref 36.0–46.0)
HEMATOCRIT: 25 % — AB (ref 36.0–46.0)
HEMATOCRIT: 24 % — AB (ref 36.0–46.0)
HEMATOCRIT: 24 % — AB (ref 36.0–46.0)
HEMATOCRIT: 24 % — AB (ref 36.0–46.0)
HEMATOCRIT: 26 % — AB (ref 36.0–46.0)
HEMATOCRIT: 26 % — AB (ref 36.0–46.0)
HEMATOCRIT: 38 % (ref 36.0–46.0)
HEMOGLOBIN: 7.8 g/dL — AB (ref 12.0–15.0)
HEMOGLOBIN: 8.2 g/dL — AB (ref 12.0–15.0)
HEMOGLOBIN: 8.2 g/dL — AB (ref 12.0–15.0)
HEMOGLOBIN: 12.9 g/dL (ref 12.0–15.0)
HEMOGLOBIN: 8.8 g/dL — AB (ref 12.0–15.0)
Hemoglobin: 8.2 g/dL — ABNORMAL LOW (ref 12.0–15.0)
Hemoglobin: 8.5 g/dL — ABNORMAL LOW (ref 12.0–15.0)
Hemoglobin: 7.5 g/dL — ABNORMAL LOW (ref 12.0–15.0)
Hemoglobin: 8.2 g/dL — ABNORMAL LOW (ref 12.0–15.0)
Hemoglobin: 7.5 g/dL — ABNORMAL LOW (ref 12.0–15.0)
Hemoglobin: 8.2 g/dL — ABNORMAL LOW (ref 12.0–15.0)
Hemoglobin: 7.1 g/dL — ABNORMAL LOW (ref 12.0–15.0)
Hemoglobin: 8.2 g/dL — ABNORMAL LOW (ref 12.0–15.0)
Hemoglobin: 7.8 g/dL — ABNORMAL LOW (ref 12.0–15.0)
Hemoglobin: 8.5 g/dL — ABNORMAL LOW (ref 12.0–15.0)
Hemoglobin: 8.2 g/dL — ABNORMAL LOW (ref 12.0–15.0)
Hemoglobin: 8.2 g/dL — ABNORMAL LOW (ref 12.0–15.0)
Hemoglobin: 8.8 g/dL — ABNORMAL LOW (ref 12.0–15.0)
POTASSIUM: 3.6 mmol/L (ref 3.5–5.1)
POTASSIUM: 3.5 mmol/L (ref 3.5–5.1)
POTASSIUM: 3.4 mmol/L — AB (ref 3.5–5.1)
POTASSIUM: 3.5 mmol/L (ref 3.5–5.1)
POTASSIUM: 3.5 mmol/L (ref 3.5–5.1)
POTASSIUM: 3.5 mmol/L (ref 3.5–5.1)
POTASSIUM: 3.4 mmol/L — AB (ref 3.5–5.1)
POTASSIUM: 3.5 mmol/L (ref 3.5–5.1)
POTASSIUM: 3.7 mmol/L (ref 3.5–5.1)
POTASSIUM: 3.1 mmol/L — AB (ref 3.5–5.1)
POTASSIUM: 3.5 mmol/L (ref 3.5–5.1)
Potassium: 3.6 mmol/L (ref 3.5–5.1)
Potassium: 3.5 mmol/L (ref 3.5–5.1)
Potassium: 3.5 mmol/L (ref 3.5–5.1)
Potassium: 3.4 mmol/L — ABNORMAL LOW (ref 3.5–5.1)
Potassium: 3.7 mmol/L (ref 3.5–5.1)
Potassium: 3.4 mmol/L — ABNORMAL LOW (ref 3.5–5.1)
Potassium: 3.5 mmol/L (ref 3.5–5.1)
SODIUM: 133 mmol/L — AB (ref 135–145)
SODIUM: 130 mmol/L — AB (ref 135–145)
SODIUM: 130 mmol/L — AB (ref 135–145)
SODIUM: 131 mmol/L — AB (ref 135–145)
SODIUM: 133 mmol/L — AB (ref 135–145)
SODIUM: 131 mmol/L — AB (ref 135–145)
SODIUM: 133 mmol/L — AB (ref 135–145)
SODIUM: 124 mmol/L — AB (ref 135–145)
SODIUM: 133 mmol/L — AB (ref 135–145)
Sodium: 129 mmol/L — ABNORMAL LOW (ref 135–145)
Sodium: 133 mmol/L — ABNORMAL LOW (ref 135–145)
Sodium: 131 mmol/L — ABNORMAL LOW (ref 135–145)
Sodium: 132 mmol/L — ABNORMAL LOW (ref 135–145)
Sodium: 129 mmol/L — ABNORMAL LOW (ref 135–145)
Sodium: 132 mmol/L — ABNORMAL LOW (ref 135–145)
Sodium: 132 mmol/L — ABNORMAL LOW (ref 135–145)
Sodium: 130 mmol/L — ABNORMAL LOW (ref 135–145)
Sodium: 133 mmol/L — ABNORMAL LOW (ref 135–145)
TCO2: 27 mmol/L (ref 0–100)
TCO2: 32 mmol/L (ref 0–100)
TCO2: 28 mmol/L (ref 0–100)
TCO2: 32 mmol/L (ref 0–100)
TCO2: 28 mmol/L (ref 0–100)
TCO2: 33 mmol/L (ref 0–100)
TCO2: 29 mmol/L (ref 0–100)
TCO2: 32 mmol/L (ref 0–100)
TCO2: 29 mmol/L (ref 0–100)
TCO2: 33 mmol/L (ref 0–100)
TCO2: 29 mmol/L (ref 0–100)
TCO2: 34 mmol/L (ref 0–100)
TCO2: 31 mmol/L (ref 0–100)
TCO2: 33 mmol/L (ref 0–100)
TCO2: 30 mmol/L (ref 0–100)
TCO2: 33 mmol/L (ref 0–100)
TCO2: 28 mmol/L (ref 0–100)
TCO2: 32 mmol/L (ref 0–100)

## 2015-01-10 LAB — RENAL FUNCTION PANEL
ALBUMIN: 1.6 g/dL — AB (ref 3.5–5.0)
ALBUMIN: 1.6 g/dL — AB (ref 3.5–5.0)
Anion gap: 11 (ref 5–15)
Anion gap: 11 (ref 5–15)
BUN: 13 mg/dL (ref 6–20)
BUN: 11 mg/dL (ref 6–20)
CO2: 33 mmol/L — ABNORMAL HIGH (ref 22–32)
CO2: 36 mmol/L — ABNORMAL HIGH (ref 22–32)
CREATININE: 0.64 mg/dL (ref 0.44–1.00)
Calcium: 10.9 mg/dL — ABNORMAL HIGH (ref 8.9–10.3)
Calcium: 11.4 mg/dL — ABNORMAL HIGH (ref 8.9–10.3)
Chloride: 89 mmol/L — ABNORMAL LOW (ref 101–111)
Chloride: 87 mmol/L — ABNORMAL LOW (ref 101–111)
Creatinine, Ser: 0.71 mg/dL (ref 0.44–1.00)
GFR calc Af Amer: >60 mL/min (ref >60)
Glucose, Bld: 108 mg/dL — ABNORMAL HIGH (ref 65–99)
Glucose, Bld: 129 mg/dL — ABNORMAL HIGH (ref 65–99)
PHOSPHORUS: 1.9 mg/dL — AB (ref 2.5–4.6)
PHOSPHORUS: 1.9 mg/dL — AB (ref 2.5–4.6)
POTASSIUM: 3.6 mmol/L (ref 3.5–5.1)
Potassium: 3.7 mmol/L (ref 3.5–5.1)
Sodium: 133 mmol/L — ABNORMAL LOW (ref 135–145)
Sodium: 134 mmol/L — ABNORMAL LOW (ref 135–145)

## 2015-01-10 LAB — BASIC METABOLIC PANEL
Anion gap: 11 (ref 5–15)
BUN: 13 mg/dL (ref 6–20)
CHLORIDE: 88 mmol/L — AB (ref 101–111)
CO2: 33 mmol/L — AB (ref 22–32)
Calcium: 10.7 mg/dL — ABNORMAL HIGH (ref 8.9–10.3)
Creatinine, Ser: 0.66 mg/dL (ref 0.44–1.00)
GFR calc Af Amer: >60 mL/min (ref >60)
GFR calc non Af Amer: >60 mL/min (ref >60)
GLUCOSE: 110 mg/dL — AB (ref 65–99)
POTASSIUM: 3.6 mmol/L (ref 3.5–5.1)
SODIUM: 132 mmol/L — AB (ref 135–145)

## 2015-01-10 LAB — CBC WITH DIFFERENTIAL/PLATELET
Basophils Absolute: 0 10*3/uL (ref 0.0–0.1)
Basophils Relative: 0 %
EOS ABS: 0 10*3/uL (ref 0.0–0.7)
EOS PCT: 0 %
HEMATOCRIT: 22.2 % — AB (ref 36.0–46.0)
Hemoglobin: 7.2 g/dL — ABNORMAL LOW (ref 12.0–15.0)
LYMPHS PCT: 19 %
Lymphs Abs: 4.1 10*3/uL — ABNORMAL HIGH (ref 0.7–4.0)
MCH: 29 pg (ref 26.0–34.0)
MCHC: 32.4 g/dL (ref 30.0–36.0)
MCV: 89.5 fL (ref 78.0–100.0)
MONO ABS: 3.9 10*3/uL — AB (ref 0.1–1.0)
Monocytes Relative: 18 %
NEUTROS PCT: 63 %
Neutro Abs: 13.8 10*3/uL — ABNORMAL HIGH (ref 1.7–7.7)
PLATELETS: 281 10*3/uL (ref 150–400)
RBC: 2.48 MIL/uL — AB (ref 3.87–5.11)
RDW: 19.4 % — AB (ref 11.5–15.5)
WBC: 21.8 10*3/uL — AB (ref 4.0–10.5)

## 2015-01-10 LAB — GLUCOSE, CAPILLARY
GLUCOSE-CAPILLARY: 117 mg/dL — AB (ref 65–99)
GLUCOSE-CAPILLARY: 112 mg/dL — AB (ref 65–99)
GLUCOSE-CAPILLARY: 112 mg/dL — AB (ref 65–99)
Glucose-Capillary: 112 mg/dL — ABNORMAL HIGH (ref 65–99)
Glucose-Capillary: 115 mg/dL — ABNORMAL HIGH (ref 65–99)

## 2015-01-10 LAB — MAGNESIUM
MAGNESIUM: 1.7 mg/dL (ref 1.7–2.4)
Magnesium: 1.7 mg/dL (ref 1.7–2.4)

## 2015-01-10 LAB — PHOSPHORUS: PHOSPHORUS: 1.8 mg/dL — AB (ref 2.5–4.6)

## 2015-01-10 LAB — APTT: aPTT: 29 seconds (ref 24–37)

## 2015-01-10 NOTE — Progress Notes (Signed)
*  PRELIMINARY RESULTS* Vascular Ultrasound Right upper extremity venous duplex has been completed.  Preliminary findings: No evidence of DVT in visualized veins.   Landry Mellow, RDMS, RVT  01/10/2015, 8:26 AM

## 2015-01-10 NOTE — Progress Notes (Signed)
PULMONARY / CRITICAL CARE MEDICINE   Name: [name] MRN: [id number] DOB: [birth year]    ADMISSION DATE:  12/23/2014 CONSULTATION DATE:  12/23/2014  REFERRING MD :  Dr. Karleen Hampshire   CHIEF COMPLAINT:  Shortness of breath   INITIAL PRESENTATION:  64 yo female smoker with dyspnea, VDRF from pneumococcal PNA and septic shock.  She has hx of HTN, GERD, PAF, CMML, COPD.  Prolonged hospital course complicated by respiratory failure in the setting of PNA initially, then pulmonary edema w AF RVR, failed extubation, bradycardic arrest, anemia, abdominal pain with spontaneous bleeding into fluid collections / intraperitoneal hemorrhage on CT abd and acute renal failure requiring CVVHD.   STUDIES:  9/05  CT Chest w/o >> diffuse bilateral airspace disease, consolidation involving all lobes of L/R lung, several discrete nodules present in upper lobes, bulky mediastinal adenopathy 9/16  CT Abd/Pelvis >> fluid collections noted along the R anterior / lateral abdominal wall extending from the RUQ to the R lower lateral abdomen above the iliac crest - ? Seromas from prior hemorrhage with hx of anemia, ? Infected fluid collections, measurement shows these collections to be the density of water.  Anasarca with ascites, small pleural effusions, atx in lower lobes, partial atx of RML, mild splenomegaly 9/17 TTE >> LV normal in size. Moderate concentric LVH. Appearance consistent with hypertrophic cardiomyopathy. EF 80-85%. No regional wall motion abnormalities. Grade 1 diastolic dysfunction. Dynamic obstruction. No significant change from prior echocardiogram except for increased EF. No pericardial effusion. IVC normal in size. Pulmonary artery systolic pressure normal. RV normal in size and function. 9/20  CT Abd/Pelvis >> interval hemorrhage into previously described large fluid collections within the right anterior / lateral abdominal wall, diffuse anasarca, moderate ascites, pulmonary consolidation within the bilateral  lower lobes with adjacent small effusions, mild splenomegaly  SIGNIFICANT EVENTS: 9/05  Admit with worsening SOB  9/06  intubated for hypoxia while receiving fluid bolus ? Edema, levo gtt 9/10  Extubated, transfuse 1 unit PRBC 9/11  A fib with RVR >> started amiodarone/levophed, respiratory failure >> re-intubated 9/12  mottled (hx of Raynauds) ECHO pending. Appropriate. Scr bumped.  9/13  Diagnostic/Therapeutic right Thoracentesis. 650 ML clear appearing. Transudate by [facility]  9/14  lasix for neg volume, weaning.  9/15  lasix and albumin. Passed SBT but had sig WOB and tachycardia.  9/16  failed SBT. Still positive volume in spite of lasix. Still on low dose pressors but may be r/t diprivan. Albumin/lasix x 3 doses. Family updated. Will likely need trach. Anemic. Getting 2 units blood.  CT of abd with fluid collection that appears to be the density of water 9/17  Bradycardic arrest after N/V & probable aspiration.  ACLS. 9/19  Anemia, tx 2 units.  Repeat CT found bleeding into fluid collection, hypoglycemia, AMS with pH 6.9, vent changes / changed vent out and repeat 7.4 9/20  Anemia noted overnight, transfused one unit PRBC, hypoglycemia, period of AMS with acidosis, vent changed out and rate adjusted with improvement.  Off levophed.  CVVHD started.  9/22  Trach per DF  SUBJECTIVE:   Very uncomfortable. Sedated or in pain   VITAL SIGNS: Temp:  [97.8 F (36.6 C)-99.4 F (37.4 C)] 98.8 F (37.1 C) (09/24 0800) Pulse Rate:  [45-116] 86 (09/24 1000) Resp:  [11-22] 20 (09/24 1000) BP: (69-150)/(32-92) 105/60 mmHg (09/24 1000) SpO2:  [92 %-100 %] 100 % (09/24 1000) FiO2 (%):  [40 %] 40 % (09/24 0749) Weight:  [170 lb 10.2 oz (77.4 kg)]  170 lb 10.2 oz (77.4 kg) (09/24 0411)      VENTILATOR SETTINGS: Vent Mode:  [-] PRVC FiO2 (%):  [40 %] 40 % Set Rate:  [20 bmp] 20 bmp Vt Set:  [500 mL] 500 mL PEEP:  [5 cmH20] 5 cmH20 Plateau Pressure:  [19 cmH20-28 cmH20] 22 cmH20    INTAKE / OUTPUT:  Intake/Output Summary (Last 24 hours) at 01/10/15 1028 Last data filed at 01/10/15 1007  Gross per 24 hour  Intake 8023.68 ml  Output  10708 ml  Net -2684.32 ml    PHYSICAL EXAMINATION: General:  Chronically ill adult female in NAD. Arouses to voice, grimaces with any stimulation. Integument:  Warm & dry. No rash on exposed skin. Mottling & cyanosis of bilateral upper extremity & lower extremity digits.  Ecchymosis on R flank. Rt upper ext with PICC/ + edema but no evidence of infection. HEENT:  No scleral injection or icterus.Trach in place, no bleeding. PERRL. Cardiovascular:  s1s2 rrr, no m/r/g.  Anasarca + Pulmonary:  Coarse breath sounds bilaterally, rhonchi. Symmetric chest wall rise on ventilator. Abdomen: Soft. Normal bowel sounds. Nondistended. Right-sided ostomy in place with drainage. Neurological:  Moving all 4 extremities equally. Cranial nerves appear grossly intact. Nods to questions, attempts to communicate. Appears to be in pain  LABS:  CBC  Recent Labs Lab 01/08/15 0525  01/09/15 0440  01/10/15 0530  01/10/15 0735 01/10/15 0949 01/10/15 1001  WBC 19.5*  --  29.4*  --  21.8*  --   --   --   --   HGB 9.0*  < > 8.3*  < > 7.2*  < > 8.5* 8.2* 7.5*  HCT 27.5*  < > 25.0*  < > 22.2*  < > 25.0* 24.0* 22.0*  PLT 305  --  287  --  281  --   --   --   --   < > = values in this interval not displayed. BMET  Recent Labs Lab 01/09/15 0440  01/09/15 1630  01/10/15 0530  01/10/15 0735 01/10/15 0949 01/10/15 1001  NA 132*  < > 131*  < > 132*  133*  < > 131* 132* 130*  K 3.5  < > 3.9  < > 3.6  3.7  < > 3.5 3.4* 3.7  CL 91*  < > 89*  < > 88*  89*  < > 85* 86* 85*  CO2 29  --  31  --  33*  33*  --   --   --   --   BUN 18  < > 14  < > 13  13  < > 11 8 12   CREATININE 0.83  < > 0.76  < > 0.66  0.64  < > 0.80 0.50 0.70  GLUCOSE 146*  < > 123*  < > 110*  108*  < > 112* 180* 118*  < > = values in this interval not displayed.    Electrolytes  Recent Labs Lab 01/09/15 0440 01/09/15 1630 01/09/15 2359 01/10/15 0530  CALCIUM 10.3 10.1  --  10.7*  10.9*  MG 1.6*  --  1.7 1.7  PHOS 1.8* 2.4*  --  1.8*  1.9*     Sepsis Markers  Recent Labs Lab 01/03/15 1615 01/03/15 2100 01/04/15 0500 01/05/15 0434  LATICACIDVEN 1.6  --   --   --   PROCALCITON  --  26.94 29.42 24.77   ABG  Recent Labs Lab 01/05/15 2035 01/05/15 2335 01/07/15 1123  PHART  6.992* 7.414 7.352  PCO2ART 85.4* 27.4* 37.7  PO2ART 89.4 115* 68.4*    Liver Enzymes  Recent Labs Lab 01/09/15 0440 01/09/15 1630 01/10/15 0530  ALBUMIN 1.7* 1.6* 1.6*     Cardiac Enzymes  Recent Labs Lab 01/03/15 1615 01/03/15 2100  TROPONINI 0.41* 0.33*    Glucose  Recent Labs Lab 01/09/15 1246 01/09/15 1630 01/09/15 2033 01/10/15 0111 01/10/15 0442 01/10/15 0756  GLUCAP 129* 115* 109* 117* 112* 112*    Imaging Dg Chest Port 1 View  01/10/2015   CLINICAL DATA:  Respiratory failure  EXAM: PORTABLE CHEST 1 VIEW  COMPARISON:  01/09/2015  FINDINGS: Support devices are in stable position. Diffuse bilateral airspace disease with layering effusions, not changed. Mild cardiomegaly.  IMPRESSION: No significant change in diffuse bilateral opacities with effusions, likely CHF.   Electronically Signed   By: Rolm Baptise M.D.   On: 01/10/2015 09:08    ASSESSMENT / PLAN:  PULMONARY ETT 9/06 >> 9/10 ETT 9/11 >> 9/22 Trach 9/22 >>   A: Acute Hypoxic Respiratory Failure - Multifactorial from Pneumonia, Volume Overload & acute aspiration event (9/17) Pulmonary Edema  Pink/Frothy Sputum - resolved Bronchospasm Bilateral Pleural Effusions - S/P Thoracentesis/Transudate. Presumed COPD - H/O Tobacco Use. Hypercarbia - 9/19 ABG 6.9, pco2 85  P:   Trached 9/22 SBT as tolerated  Scheduled Duonebs Budesonide nebs Wean PEEP / FiO2 for sats > 90% Intermittent CXR  CARDIOVASCULAR Rt PICC 9/06 >> A:  Bradycardic Arrest 9/17 - Likely due to  aspiration. A-Fib with RVR- converted to NSR. Currently ST.  Shock - Likely ongoing due to sedation, On neo drip 9/23 with sedation contributing to hypotension. H/O hypertension  P:  Telemetry monitoring  Holding ASA SCD's only with bleeding Wean sedation as tolerated. Neo for hypotension.  RENAL A:   Acute Renal Failure - worsening with anuria 9/20. Hypervolemia - due to hypoalbuminemia Metabolic acidosis  Hypokalemia - treat Hypomagnesemia -treat. Hyponatremia -treat  P:   Trend BMP / Crozier  Nephrology following, appreciate input Replace electrolytes as indicated  CVVHD per Nephrology. Keep even as pressor requirement are going up.  GASTROINTESTINAL A:   Abdominal Hemorrhage into Fluid Collections on R / Intraperitoneal Hemorrhage Chronic diarrhea with C diff colonization Hypoalbuminemia  N/V - Holding tube feedings Hx of GERD  P:   Tolerating tube feeds. At goal. Protonix IV daily Zofran IV prn See ID Not a surgical candidate.  HEMATOLOGIC  Recent Labs  01/10/15 0949 01/10/15 1001  HGB 8.2* 7.5*    A:   Anemia - No signs of active bleeding on CT. S/P 2u PRBC 9/16. & 1u PRBC 9/17. Hx of CMML with chronic anemia.  P:  Trend Hgb daily w/ CBC SCDs Transfuse for Hgb <7 Minimize blood draws as able   INFECTIOUS A:   Septic shock - Secondary to Pneumonia & questionable bacteremia. Questionable Streptococcal Pneumonia - Completed Fortaz 9/14 Streptococcal Viridans Bacteremia - Completed Fortaz 9/14  Cdiff colonization Enterobacter C resistant 9/23 changed to primaxin  P:   Consider ID consult  Linezolid 9/17 >> 9/19 Aztreonam 9/17 >> 9/23 Flagyl IV 9/17 >> Primaxin 9/23>>  Blood Ctx 9/17 >> Urine Ctx 9/17 >> enterococcus >> sens vanco Resp Ctx 9/17 >> enterobacter cloacae >>resistant to aztreonam Resp cx 9/22>> Blood Ctx 9/13 >> negative  R Pleural Effusion 9/13 >> negative  BAL 9/7>>Bacteria Neg/AFB pending/Fungal pending  Blood Ctx 9/5  >> 1/2 positive for Strep Viridans Urine Strep Ag 9/5 >> negative Urine Leg Ag  9/5 >> negative  ENDOCRINE CBG (last 3)   Recent Labs  01/10/15 0111 01/10/15 0442 01/10/15 0756  GLUCAP 117* 112* 112*     A: Hyperglycemia  P: Monitor BG & RN to notify for <90 or >160 Off D10  NEUROLOGY A: Chest Pain post resuscitation H/O Chronic pain Depression Vent tolearance  P: RASS goal: 0 to -1 Fentanyl IV prn + diprivan. Consider weaning sedation during day and work on weaning.  TODAYS SUMMARY:  64 year old female with atrial fibrillation and pneumonia with repeat intubation due to hypoxic respiratory failure. Patient has had failure to wean likely owing to pulmonary edema from hypoalbuminemic state. Patient suffered bradycardic arrest on 9/17 after episode of emesis. Patient had no evidence of bowel obstruction or residual tube feeds prior to this event. With elevation of Procalcitonin and no obvious source for shock otherwise in the face of leukocytosis broad-spectrum antibiotics were restarted after repeating cultures. Hgb dropped overnight 9/19.  Repeat CT abd showed bleeding into prior fluid collections. CVVHD initiated on 9/20.  Clinical prognosis remains guarded given multiple medical problems. Family very involved in patients care.  Continue to request full code status.   Trach performed 9/22. 9/23 she continues on trach/vent. Constant pain is an issue. Continue current care, code status may need revisit in near future.  [name] MD [facility] Pager [phone] If no answer or after 3pm call: [phone] 01/10/2015, 10:37 AM

## 2015-01-10 NOTE — Progress Notes (Addendum)
[facility] Progress Note   Subjective: net negative 2400 yesterday. Pressors up and down, currently 80 ug/ min.  WBC down slightly to 26k.  On sedation due to lots of grimacing when trying to lower fentanyl gtt dose. Not responding to commands.   Filed Vitals:   01/10/15 0730 01/10/15 0741 01/10/15 0749 01/10/15 0800  BP: 80/49   99/56  Pulse: 88   94  Temp:    98.8 F (37.1 C)  TempSrc:    Axillary  Resp: 20   20  Height:      Weight:      SpO2: 100% 99% 100% 98%   Exam: Opens eyes to voice then falls back asleep , not following commands No JVD Chest scattered rhonchi RRR no MRG Abd soft ntnd large mid-abd scar healed over, RLQ ostomy 2+ LE edema bilat Neuro as above   Assessment: 1 Acute kidney injury - ATN due to shock/ cardiac arrest. D#5 CRRT. Have gotten vol down some but hypotension prohibiting and pressors still high. New afib last night. 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 Will keep even with CRRT for now.  Talked with sister today and gave poor prognosis. She will d/w other family.     Kelly Splinter MD  pager [phone]    cell [phone]  01/10/2015, 8:58 AM     Recent Labs Lab 01/09/15 0440  01/09/15 1630  01/10/15 0530 01/10/15 0534 01/10/15 0538  NA 132*  < > 131*  < > 132*  133* 129* 133*  K 3.5  < > 3.9  < > 3.6  3.7 3.6 3.6  CL 91*  < > 89*  < > 88*  89* 85* 87*  CO2 29  --  31  --  33*  33*  --   --   GLUCOSE 146*  < > 123*  < > 110*  108* 104* 169*  BUN 18  < > 14  < > 13  13 11 8   CREATININE 0.83  < > 0.76  < >  0.66  0.64 0.70 0.50  CALCIUM 10.3  --  10.1  --  10.7*  10.9*  --   --   PHOS 1.8*  --  2.4*  --  1.8*  1.9*  --   --   < > = values in this interval not displayed.  Recent Labs Lab 01/03/15 1020  01/09/15 0440 01/09/15 1630 01/10/15 0530  AST 30  --   --   --   --   ALT 13*  --   --   --   --   ALKPHOS 163*  --   --   --   --   BILITOT 0.8  --   --   --   --   PROT 4.7*  --   --   --   --   ALBUMIN 2.2*  < > 1.7* 1.6* 1.6*  < > = values in this interval not displayed.  Recent Labs Lab 01/08/15 0525  01/09/15 0440  01/10/15 0530 01/10/15 0534  01/10/15 0538  WBC 19.5*  --  29.4*  --  21.8*  --   --   NEUTROABS 10.8*  --  20.5*  --  13.8*  --   --   HGB 9.0*  < > 8.3*  < > 7.2* 7.8* 8.2*  HCT 27.5*  < > 25.0*  < > 22.2* 23.0* 24.0*  MCV 86.8  --  87.7  --  89.5  --   --   PLT 305  --  287  --  281  --   --   < > = values in this interval not displayed. Marland Kitchen antiseptic oral rinse  7 mL Mouth Rinse QID  . arformoterol  15 mcg Nebulization BID  . budesonide (PULMICORT) nebulizer solution  0.5 mg Nebulization BID  . chlorhexidine gluconate  15 mL Mouth Rinse BID  . etomidate  40 mg Intravenous Once  . feeding supplement (NEPRO CARB STEADY)  1,000 mL Oral Q24H  . feeding supplement (PRO-STAT SUGAR FREE 64)  30 mL Per Tube BID  . fluconazole (DIFLUCAN) IV  400 mg Intravenous Q1400  . imipenem-cilastatin  500 mg Intravenous Q6H  . linezolid  600 mg Intravenous Q12H  . metronidazole  500 mg Intravenous 3 times per day  . pantoprazole (PROTONIX) IV  40 mg Intravenous Q24H  . promethazine  6.25 mg Intravenous Once  . sodium chloride  10-40 mL Intracatheter Q12H  . vecuronium  10 mg Intravenous Once   . amiodarone 30 mg/hr (01/10/15 0800)  . calcium gluconate infusion for CRRT 20 g (01/10/15 0855)  . dextrose Stopped (01/09/15 1631)  . fentaNYL infusion INTRAVENOUS 175 mcg/hr (01/10/15 0800)  . phenylephrine (NEO-SYNEPHRINE) Adult infusion 80 mcg/min (01/10/15 0854)  .  dialysis replacement fluid (prismasate) Stopped (01/07/15 1445)  . dialysis replacement fluid (prismasate) 200 mL/hr at 01/09/15 2037  . dialysate (PRISMASATE) 1,800 mL/hr at 01/10/15 0800  . sodium citrate 2 %/dextrose 2.5% solution 3000 mL 440 mL/hr at 01/10/15 0856   albuterol, albuterol, alteplase, fentaNYL (SUBLIMAZE) injection, heparin, metoprolol, [DISCONTINUED] ondansetron **OR** ondansetron (ZOFRAN) IV, sodium chloride, sodium chloride

## 2015-01-10 NOTE — Progress Notes (Signed)
RT attempted CPT via BED but Pt was grimacing in pain.  Family and RT agreed to hold at this time, RT to monitor and assess as needed.

## 2015-01-10 NOTE — Progress Notes (Signed)
RT did not do  Midnight CPT due to patient having peg tube issues. RN aware

## 2015-01-11 ENCOUNTER — Inpatient Hospital Stay (HOSPITAL_COMMUNITY): Payer: 59

## 2015-01-11 DIAGNOSIS — Z515 Encounter for palliative care: Secondary | ICD-10-CM | POA: Insufficient documentation

## 2015-01-11 DIAGNOSIS — Z992 Dependence on renal dialysis: Secondary | ICD-10-CM

## 2015-01-11 DIAGNOSIS — R52 Pain, unspecified: Secondary | ICD-10-CM | POA: Insufficient documentation

## 2015-01-11 LAB — POCT I-STAT, CHEM 8
BUN: 7 mg/dL (ref 6–20)
BUN: 9 mg/dL (ref 6–20)
BUN: 7 mg/dL (ref 6–20)
BUN: 9 mg/dL (ref 6–20)
BUN: 7 mg/dL (ref 6–20)
BUN: 9 mg/dL (ref 6–20)
BUN: 7 mg/dL (ref 6–20)
BUN: 8 mg/dL (ref 6–20)
BUN: 7 mg/dL (ref 6–20)
BUN: 9 mg/dL (ref 6–20)
BUN: 7 mg/dL (ref 6–20)
BUN: 9 mg/dL (ref 6–20)
BUN: 7 mg/dL (ref 6–20)
BUN: 8 mg/dL (ref 6–20)
BUN: 6 mg/dL (ref 6–20)
BUN: 8 mg/dL (ref 6–20)
BUN: 6 mg/dL (ref 6–20)
BUN: 9 mg/dL (ref 6–20)
BUN: 6 mg/dL (ref 6–20)
BUN: 8 mg/dL (ref 6–20)
CALCIUM ION: 1.12 mmol/L — AB (ref 1.13–1.30)
CALCIUM ION: 0.57 mmol/L — AB (ref 1.13–1.30)
CALCIUM ION: 0.85 mmol/L — AB (ref 1.13–1.30)
CALCIUM ION: 0.8 mmol/L — AB (ref 1.13–1.30)
CALCIUM ION: 0.36 mmol/L — AB (ref 1.13–1.30)
CALCIUM ION: 0.77 mmol/L — AB (ref 1.13–1.30)
CHLORIDE: 85 mmol/L — AB (ref 101–111)
CHLORIDE: 86 mmol/L — AB (ref 101–111)
CHLORIDE: 87 mmol/L — AB (ref 101–111)
CHLORIDE: 85 mmol/L — AB (ref 101–111)
CHLORIDE: 87 mmol/L — AB (ref 101–111)
CHLORIDE: 87 mmol/L — AB (ref 101–111)
CHLORIDE: 87 mmol/L — AB (ref 101–111)
CHLORIDE: 86 mmol/L — AB (ref 101–111)
CHLORIDE: 88 mmol/L — AB (ref 101–111)
CHLORIDE: 88 mmol/L — AB (ref 101–111)
CHLORIDE: 89 mmol/L — AB (ref 101–111)
CHLORIDE: 88 mmol/L — AB (ref 101–111)
CHLORIDE: 89 mmol/L — AB (ref 101–111)
CHLORIDE: 90 mmol/L — AB (ref 101–111)
CREATININE: 0.6 mg/dL (ref 0.44–1.00)
CREATININE: 0.8 mg/dL (ref 0.44–1.00)
CREATININE: 0.6 mg/dL (ref 0.44–1.00)
CREATININE: 0.6 mg/dL (ref 0.44–1.00)
CREATININE: 0.7 mg/dL (ref 0.44–1.00)
CREATININE: 0.7 mg/dL (ref 0.44–1.00)
CREATININE: 0.5 mg/dL (ref 0.44–1.00)
CREATININE: 0.5 mg/dL (ref 0.44–1.00)
CREATININE: 0.7 mg/dL (ref 0.44–1.00)
CREATININE: 0.6 mg/dL (ref 0.44–1.00)
CREATININE: 0.6 mg/dL (ref 0.44–1.00)
Calcium, Ion: 0.7 mmol/L — ABNORMAL LOW (ref 1.13–1.30)
Calcium, Ion: 1.22 mmol/L (ref 1.13–1.30)
Calcium, Ion: 0.62 mmol/L — CL (ref 1.13–1.30)
Calcium, Ion: 1.08 mmol/L — ABNORMAL LOW (ref 1.13–1.30)
Calcium, Ion: 0.5 mmol/L — CL (ref 1.13–1.30)
Calcium, Ion: 0.99 mmol/L — ABNORMAL LOW (ref 1.13–1.30)
Calcium, Ion: 0.46 mmol/L — CL (ref 1.13–1.30)
Calcium, Ion: 0.95 mmol/L — ABNORMAL LOW (ref 1.13–1.30)
Calcium, Ion: 0.44 mmol/L — CL (ref 1.13–1.30)
Calcium, Ion: 0.92 mmol/L — ABNORMAL LOW (ref 1.13–1.30)
Calcium, Ion: 0.41 mmol/L — CL (ref 1.13–1.30)
Calcium, Ion: 0.39 mmol/L — CL (ref 1.13–1.30)
Calcium, Ion: 0.83 mmol/L — ABNORMAL LOW (ref 1.13–1.30)
Calcium, Ion: 0.37 mmol/L — CL (ref 1.13–1.30)
Chloride: 87 mmol/L — ABNORMAL LOW (ref 101–111)
Chloride: 86 mmol/L — ABNORMAL LOW (ref 101–111)
Chloride: 87 mmol/L — ABNORMAL LOW (ref 101–111)
Chloride: 87 mmol/L — ABNORMAL LOW (ref 101–111)
Chloride: 87 mmol/L — ABNORMAL LOW (ref 101–111)
Chloride: 87 mmol/L — ABNORMAL LOW (ref 101–111)
Creatinine, Ser: 0.8 mg/dL (ref 0.44–1.00)
Creatinine, Ser: 0.6 mg/dL (ref 0.44–1.00)
Creatinine, Ser: 0.7 mg/dL (ref 0.44–1.00)
Creatinine, Ser: 0.6 mg/dL (ref 0.44–1.00)
Creatinine, Ser: 0.7 mg/dL (ref 0.44–1.00)
Creatinine, Ser: 0.6 mg/dL (ref 0.44–1.00)
Creatinine, Ser: 0.6 mg/dL (ref 0.44–1.00)
Creatinine, Ser: 0.7 mg/dL (ref 0.44–1.00)
Creatinine, Ser: 0.5 mg/dL (ref 0.44–1.00)
GLUCOSE: 120 mg/dL — AB (ref 65–99)
GLUCOSE: 121 mg/dL — AB (ref 65–99)
GLUCOSE: 153 mg/dL — AB (ref 65–99)
GLUCOSE: 124 mg/dL — AB (ref 65–99)
GLUCOSE: 183 mg/dL — AB (ref 65–99)
GLUCOSE: 120 mg/dL — AB (ref 65–99)
GLUCOSE: 181 mg/dL — AB (ref 65–99)
GLUCOSE: 123 mg/dL — AB (ref 65–99)
GLUCOSE: 168 mg/dL — AB (ref 65–99)
Glucose, Bld: 150 mg/dL — ABNORMAL HIGH (ref 65–99)
Glucose, Bld: 151 mg/dL — ABNORMAL HIGH (ref 65–99)
Glucose, Bld: 161 mg/dL — ABNORMAL HIGH (ref 65–99)
Glucose, Bld: 189 mg/dL — ABNORMAL HIGH (ref 65–99)
Glucose, Bld: 132 mg/dL — ABNORMAL HIGH (ref 65–99)
Glucose, Bld: 167 mg/dL — ABNORMAL HIGH (ref 65–99)
Glucose, Bld: 137 mg/dL — ABNORMAL HIGH (ref 65–99)
Glucose, Bld: 182 mg/dL — ABNORMAL HIGH (ref 65–99)
Glucose, Bld: 134 mg/dL — ABNORMAL HIGH (ref 65–99)
Glucose, Bld: 172 mg/dL — ABNORMAL HIGH (ref 65–99)
Glucose, Bld: 123 mg/dL — ABNORMAL HIGH (ref 65–99)
HCT: 24 % — ABNORMAL LOW (ref 36.0–46.0)
HCT: 25 % — ABNORMAL LOW (ref 36.0–46.0)
HCT: 23 % — ABNORMAL LOW (ref 36.0–46.0)
HCT: 24 % — ABNORMAL LOW (ref 36.0–46.0)
HCT: 23 % — ABNORMAL LOW (ref 36.0–46.0)
HCT: 24 % — ABNORMAL LOW (ref 36.0–46.0)
HCT: 22 % — ABNORMAL LOW (ref 36.0–46.0)
HCT: 21 % — ABNORMAL LOW (ref 36.0–46.0)
HCT: 22 % — ABNORMAL LOW (ref 36.0–46.0)
HCT: 22 % — ABNORMAL LOW (ref 36.0–46.0)
HEMATOCRIT: 24 % — AB (ref 36.0–46.0)
HEMATOCRIT: 24 % — AB (ref 36.0–46.0)
HEMATOCRIT: 23 % — AB (ref 36.0–46.0)
HEMATOCRIT: 24 % — AB (ref 36.0–46.0)
HEMATOCRIT: 22 % — AB (ref 36.0–46.0)
HEMATOCRIT: 25 % — AB (ref 36.0–46.0)
HEMATOCRIT: 23 % — AB (ref 36.0–46.0)
HEMATOCRIT: 22 % — AB (ref 36.0–46.0)
HEMATOCRIT: 24 % — AB (ref 36.0–46.0)
HEMATOCRIT: 21 % — AB (ref 36.0–46.0)
HEMOGLOBIN: 7.8 g/dL — AB (ref 12.0–15.0)
HEMOGLOBIN: 8.2 g/dL — AB (ref 12.0–15.0)
HEMOGLOBIN: 8.2 g/dL — AB (ref 12.0–15.0)
HEMOGLOBIN: 7.8 g/dL — AB (ref 12.0–15.0)
HEMOGLOBIN: 8.2 g/dL — AB (ref 12.0–15.0)
HEMOGLOBIN: 7.5 g/dL — AB (ref 12.0–15.0)
HEMOGLOBIN: 7.8 g/dL — AB (ref 12.0–15.0)
HEMOGLOBIN: 7.1 g/dL — AB (ref 12.0–15.0)
Hemoglobin: 8.2 g/dL — ABNORMAL LOW (ref 12.0–15.0)
Hemoglobin: 8.5 g/dL — ABNORMAL LOW (ref 12.0–15.0)
Hemoglobin: 7.8 g/dL — ABNORMAL LOW (ref 12.0–15.0)
Hemoglobin: 8.2 g/dL — ABNORMAL LOW (ref 12.0–15.0)
Hemoglobin: 8.2 g/dL — ABNORMAL LOW (ref 12.0–15.0)
Hemoglobin: 8.5 g/dL — ABNORMAL LOW (ref 12.0–15.0)
Hemoglobin: 7.5 g/dL — ABNORMAL LOW (ref 12.0–15.0)
Hemoglobin: 7.5 g/dL — ABNORMAL LOW (ref 12.0–15.0)
Hemoglobin: 8.2 g/dL — ABNORMAL LOW (ref 12.0–15.0)
Hemoglobin: 7.5 g/dL — ABNORMAL LOW (ref 12.0–15.0)
Hemoglobin: 7.1 g/dL — ABNORMAL LOW (ref 12.0–15.0)
Hemoglobin: 7.5 g/dL — ABNORMAL LOW (ref 12.0–15.0)
POTASSIUM: 3.8 mmol/L (ref 3.5–5.1)
POTASSIUM: 3.8 mmol/L (ref 3.5–5.1)
POTASSIUM: 3.6 mmol/L (ref 3.5–5.1)
POTASSIUM: 3.8 mmol/L (ref 3.5–5.1)
POTASSIUM: 3.6 mmol/L (ref 3.5–5.1)
POTASSIUM: 3.6 mmol/L (ref 3.5–5.1)
POTASSIUM: 3.7 mmol/L (ref 3.5–5.1)
POTASSIUM: 3.7 mmol/L (ref 3.5–5.1)
POTASSIUM: 3.5 mmol/L (ref 3.5–5.1)
POTASSIUM: 3.5 mmol/L (ref 3.5–5.1)
POTASSIUM: 3.7 mmol/L (ref 3.5–5.1)
POTASSIUM: 3.6 mmol/L (ref 3.5–5.1)
POTASSIUM: 3.7 mmol/L (ref 3.5–5.1)
POTASSIUM: 3.4 mmol/L — AB (ref 3.5–5.1)
Potassium: 3.7 mmol/L (ref 3.5–5.1)
Potassium: 3.7 mmol/L (ref 3.5–5.1)
Potassium: 3.6 mmol/L (ref 3.5–5.1)
Potassium: 3.6 mmol/L (ref 3.5–5.1)
Potassium: 3.6 mmol/L (ref 3.5–5.1)
Potassium: 3.5 mmol/L (ref 3.5–5.1)
SODIUM: 133 mmol/L — AB (ref 135–145)
SODIUM: 135 mmol/L (ref 135–145)
SODIUM: 133 mmol/L — AB (ref 135–145)
SODIUM: 136 mmol/L (ref 135–145)
SODIUM: 133 mmol/L — AB (ref 135–145)
SODIUM: 135 mmol/L (ref 135–145)
SODIUM: 135 mmol/L (ref 135–145)
SODIUM: 136 mmol/L (ref 135–145)
Sodium: 132 mmol/L — ABNORMAL LOW (ref 135–145)
Sodium: 133 mmol/L — ABNORMAL LOW (ref 135–145)
Sodium: 133 mmol/L — ABNORMAL LOW (ref 135–145)
Sodium: 134 mmol/L — ABNORMAL LOW (ref 135–145)
Sodium: 132 mmol/L — ABNORMAL LOW (ref 135–145)
Sodium: 134 mmol/L — ABNORMAL LOW (ref 135–145)
Sodium: 132 mmol/L — ABNORMAL LOW (ref 135–145)
Sodium: 134 mmol/L — ABNORMAL LOW (ref 135–145)
Sodium: 134 mmol/L — ABNORMAL LOW (ref 135–145)
Sodium: 134 mmol/L — ABNORMAL LOW (ref 135–145)
Sodium: 135 mmol/L (ref 135–145)
Sodium: 135 mmol/L (ref 135–145)
TCO2: 28 mmol/L (ref 0–100)
TCO2: 31 mmol/L (ref 0–100)
TCO2: 28 mmol/L (ref 0–100)
TCO2: 32 mmol/L (ref 0–100)
TCO2: 30 mmol/L (ref 0–100)
TCO2: 32 mmol/L (ref 0–100)
TCO2: 29 mmol/L (ref 0–100)
TCO2: 33 mmol/L (ref 0–100)
TCO2: 29 mmol/L (ref 0–100)
TCO2: 31 mmol/L (ref 0–100)
TCO2: 27 mmol/L (ref 0–100)
TCO2: 30 mmol/L (ref 0–100)
TCO2: 28 mmol/L (ref 0–100)
TCO2: 29 mmol/L (ref 0–100)
TCO2: 28 mmol/L (ref 0–100)
TCO2: 30 mmol/L (ref 0–100)
TCO2: 27 mmol/L (ref 0–100)
TCO2: 31 mmol/L (ref 0–100)
TCO2: 26 mmol/L (ref 0–100)
TCO2: 29 mmol/L (ref 0–100)

## 2015-01-11 LAB — CBC WITH DIFFERENTIAL/PLATELET
BASOS ABS: 0 10*3/uL (ref 0.0–0.1)
Basophils Relative: 0 %
Eosinophils Absolute: 0 10*3/uL (ref 0.0–0.7)
Eosinophils Relative: 0 %
HCT: 22 % — ABNORMAL LOW (ref 36.0–46.0)
HEMOGLOBIN: 7 g/dL — AB (ref 12.0–15.0)
LYMPHS ABS: 4.2 10*3/uL — AB (ref 0.7–4.0)
Lymphocytes Relative: 14 %
MCH: 28.6 pg (ref 26.0–34.0)
MCHC: 31.8 g/dL (ref 30.0–36.0)
MCV: 89.8 fL (ref 78.0–100.0)
MONOS PCT: 17 %
Monocytes Absolute: 5.1 10*3/uL — ABNORMAL HIGH (ref 0.1–1.0)
NEUTROS ABS: 20.4 10*3/uL — AB (ref 1.7–7.7)
Neutrophils Relative %: 69 %
PLATELETS: 308 10*3/uL (ref 150–400)
RBC: 2.45 MIL/uL — ABNORMAL LOW (ref 3.87–5.11)
RDW: 19.4 % — ABNORMAL HIGH (ref 11.5–15.5)
WBC: 29.7 10*3/uL — ABNORMAL HIGH (ref 4.0–10.5)

## 2015-01-11 LAB — PHOSPHORUS: PHOSPHORUS: 1.8 mg/dL — AB (ref 2.5–4.6)

## 2015-01-11 LAB — RENAL FUNCTION PANEL
ALBUMIN: 1.7 g/dL — AB (ref 3.5–5.0)
ANION GAP: 9 (ref 5–15)
Albumin: 1.7 g/dL — ABNORMAL LOW (ref 3.5–5.0)
Anion gap: 10 (ref 5–15)
BUN: 11 mg/dL (ref 6–20)
BUN: 11 mg/dL (ref 6–20)
CHLORIDE: 91 mmol/L — AB (ref 101–111)
CO2: 35 mmol/L — AB (ref 22–32)
CO2: 34 mmol/L — ABNORMAL HIGH (ref 22–32)
CREATININE: 0.54 mg/dL (ref 0.44–1.00)
Calcium: 10.1 mg/dL (ref 8.9–10.3)
Calcium: 8.1 mg/dL — ABNORMAL LOW (ref 8.9–10.3)
Chloride: 93 mmol/L — ABNORMAL LOW (ref 101–111)
Creatinine, Ser: 0.7 mg/dL (ref 0.44–1.00)
GFR calc Af Amer: >60 mL/min (ref >60)
GFR calc non Af Amer: >60 mL/min (ref >60)
GFR calc non Af Amer: >60 mL/min (ref >60)
GLUCOSE: 134 mg/dL — AB (ref 65–99)
Glucose, Bld: 121 mg/dL — ABNORMAL HIGH (ref 65–99)
PHOSPHORUS: 1.7 mg/dL — AB (ref 2.5–4.6)
POTASSIUM: 3.6 mmol/L (ref 3.5–5.1)
Phosphorus: 1.8 mg/dL — ABNORMAL LOW (ref 2.5–4.6)
Potassium: 3.9 mmol/L (ref 3.5–5.1)
Sodium: 135 mmol/L (ref 135–145)
Sodium: 137 mmol/L (ref 135–145)

## 2015-01-11 LAB — BASIC METABOLIC PANEL
ANION GAP: 9 (ref 5–15)
BUN: 11 mg/dL (ref 6–20)
CO2: 36 mmol/L — ABNORMAL HIGH (ref 22–32)
Calcium: 10.2 mg/dL (ref 8.9–10.3)
Chloride: 90 mmol/L — ABNORMAL LOW (ref 101–111)
Creatinine, Ser: 0.66 mg/dL (ref 0.44–1.00)
Glucose, Bld: 124 mg/dL — ABNORMAL HIGH (ref 65–99)
POTASSIUM: 3.9 mmol/L (ref 3.5–5.1)
SODIUM: 135 mmol/L (ref 135–145)

## 2015-01-11 LAB — MAGNESIUM: Magnesium: 1.8 mg/dL (ref 1.7–2.4)

## 2015-01-11 LAB — CALCIUM, IONIZED
Calcium, Ionized, Serum: 4.4 mg/dL — ABNORMAL LOW (ref 4.5–5.6)
Calcium, Ionized, Serum: 4.8 mg/dL (ref 4.5–5.6)

## 2015-01-11 LAB — APTT: APTT: 32 s (ref 24–37)

## 2015-01-11 LAB — GLUCOSE, CAPILLARY: Glucose-Capillary: 100 mg/dL — ABNORMAL HIGH (ref 65–99)

## 2015-01-11 MED ORDER — HYDROMORPHONE HCL 1 MG/ML IJ SOLN
1.0000 mg | INTRAMUSCULAR | Status: DC | PRN
  Administered 2015-01-11 – 2015-01-12 (×8): 1 mg via INTRAVENOUS
  Filled 2015-01-11 (×8): qty 1

## 2015-01-11 MED ORDER — MIDAZOLAM HCL 2 MG/2ML IJ SOLN
1.0000 mg | INTRAMUSCULAR | Status: DC | PRN
  Administered 2015-01-11 – 2015-01-12 (×6): 1 mg via INTRAVENOUS
  Filled 2015-01-11 (×7): qty 2

## 2015-01-11 NOTE — Consult Note (Signed)
Consultation Note Date: 01/11/2015   Patient Name: [name]  DOB: 1950-10-29  MRN: [id number]  Age / Sex: 64 y.o., female   PCP: Kari Coco, MD Referring Physician: Rigoberto Noel, MD  Reason for Consultation: Establishing goals of care  Palliative Care Assessment and Plan Summary of Established Goals of Care and Medical Treatment Preferences  Brief narrative: 64 yo female smoker with dyspnea, VDRF from pneumococcal PNA and septic shock. She has hx of HTN, GERD, PAF, CMML, COPD. Prolonged hospital course complicated by respiratory failure in the setting of PNA initially, then pulmonary edema w AF RVR, failed extubation, bradycardic arrest, anemia, abdominal pain with spontaneous bleeding into fluid collections / intraperitoneal hemorrhage on CT abd and acute renal failure requiring CVVHD.  STUDIES:  9/05 CT Chest w/o >> diffuse bilateral airspace disease, consolidation involving all lobes of L/R lung, several discrete nodules present in upper lobes, bulky mediastinal adenopathy 9/16 CT Abd/Pelvis >> fluid collections noted along the R anterior / lateral abdominal wall extending from the RUQ to the R lower lateral abdomen above the iliac crest - ? Seromas from prior hemorrhage with hx of anemia, ? Infected fluid collections, measurement shows these collections to be the density of water. Anasarca with ascites, small pleural effusions, atx in lower lobes, partial atx of RML, mild splenomegaly 9/17 TTE >> LV normal in size. Moderate concentric LVH. Appearance consistent with hypertrophic cardiomyopathy. EF 80-85%. No regional wall motion abnormalities. Grade 1 diastolic dysfunction. Dynamic obstruction. No significant change from prior echocardiogram except for increased EF. No pericardial effusion. IVC normal in size. Pulmonary artery systolic pressure normal. RV normal in size and function. 9/20 CT Abd/Pelvis >> interval hemorrhage into previously described large fluid collections  within the right anterior / lateral abdominal wall, diffuse anasarca, moderate ascites, pulmonary consolidation within the bilateral lower lobes with adjacent small effusions, mild splenomegaly  SIGNIFICANT EVENTS: 9/05 Admit with worsening SOB  9/06 intubated for hypoxia while receiving fluid bolus ? Edema, levo gtt 9/10 Extubated, transfuse 1 unit PRBC 9/11 A fib with RVR >> started amiodarone/levophed, respiratory failure >> re-intubated 9/12 mottled (hx of Raynauds) ECHO pending. Appropriate. Scr bumped.  9/13 Diagnostic/Therapeutic right Thoracentesis. 650 ML clear appearing. Transudate by Light Criteria  9/14 lasix for neg volume, weaning.  9/15 lasix and albumin. Passed SBT but had sig WOB and tachycardia.  9/16 failed SBT. Still positive volume in spite of lasix. Still on low dose pressors but may be r/t diprivan. Albumin/lasix x 3 doses. Family updated. Will likely need trach. Anemic. Getting 2 units blood. CT of abd with fluid collection that appears to be the density of water 9/17 Bradycardic arrest after N/V & probable aspiration. ACLS. 9/19 Anemia, tx 2 units. Repeat CT found bleeding into fluid collection, hypoglycemia, AMS with pH 6.9, vent changes / changed vent out and repeat 7.4 9/20 Anemia noted overnight, transfused one unit PRBC, hypoglycemia, period of AMS with acidosis, vent changed out and rate adjusted with improvement. Off levophed. CVVHD started.  9/22 Trach per DF  A palliative consult has been requested on 01-11-15 for goals of care discussions and for having discussions about next steps for a patient not responding to ICU level of care measures after weeks of life support.   The patient is resting in bed, she is a Designer, jewellery, she worked as a Therapist, sports for the [employer], she has worked in various settings as an NP. The patient' sister [name] is her HCPOA agent, the patient was living at  home with her daughter. Her other daughter and son  [name] are present at the bedside. It is noted that the patient's daughter with whom she was living has not been into see the patient for the past several days, the family has no knowledge of where she is. The patient has had few bouts of PNA this year for which she completed multiple rounds of antibiotics in the outpatient setting this year. The patient was able to do most of her ADLs but was with gradual progressive physical decline for the past several months now.   Patient seen and examined, she is resting in bed with her eyes open, she has a glazed look. She has a Retail banker on, she appears to be in mild to moderate distress. She is not able to respond appropriately to questions asked.   FAMILY MEETING: with patient's sister [name], daughter and son and daughter in law outside the patient's room:  Brief life review performed.  Agreed with establishing DNR  Continue current measures for a time limited trial of the next 24 hours.  Augment supportive measures such as up titration of Fentanyl infusion, using Dilaudid IV PRN, using Versed IV PRN for now.  Family wishes to notify other family members including the patient's other daughter about the patient's acute critical condition.  Family wishes to meet again with palliative in another 24 hours, will meet at 10 am in the patient's room on 2015/02/08. At that time, we will proceed with compassionate liberation from the ventilator and establishment of comfort measures only.   Discussed with patient's family about compassionate extubation/comfort care in great detail:  D/C tube feeds after midnight tonight  Titrate fentanyl infusion, use Dilaudid IV PRN,Versed IV PRN  Disconnect trach from ventilator at 10 am in the ICU  Transfer out of ICU if deemed appropriate after compassionate liberation from the ventilator.   Patient's family states that the patient has whispered "help me die" and "help me" on multiple ocassions over the course of the past  few days.  Family states that the patient has started declining even more since her trach procedure.  They have received extensive information from Dr [name] as well as Dr Jonnie Finner about the patient's prognosis, and her current clinical condition.    Contacts/Participants in Discussion: Primary Decision Maker:    Patient's sister Myriam Forehand: yes   sister [name] is the HCPOA agent.   Code Status/Advance Care Planning:  Code status established as DNR  The patient has a living will, she had designated her sister [name] to be her HCPOA agent.   Symptom Management:   Uncontrolled pain, generalized: patient allergic to Codeine. Optimize Fentanyl infusion, use Dilaudid IV PRN, use Versed IV PRN  Palliative Prophylaxis: yes   Additional Recommendations (Limitations, Scope, Preferences):  Compassionate extubation to be done on 08-Feb-2015 at 10AM. Palliative provider will attend.  Continue use of Fentanyl for pain management.   Psycho-social/Spiritual:   Support System: strong, patient's sister, son and daughter are present.   Desire for further Chaplaincy support:yes  Prognosis: Hours - Days  Discharge Planning:  likely hospital death  Values: transitioning towards a more comfort based approach to her care.  Life limiting illness: VDRF, renal failure, PNA, sepsis      Chief Complaint/History of Present Illness: pneumonia  Primary Diagnoses  Present on Admission:  . Atrial fibrillation . Dehydration . Diarrhea . CMML (chronic myelomonocytic leukemia) . Anemia of chronic disease . GERD (gastroesophageal reflux disease) . HCAP (healthcare-associated pneumonia) .  Hyperkalemia . Chronic back pain  Palliative Review of Systems: Completed through her family members 1.uncontrolled pain  I have reviewed the medical record, interviewed the patient and family, and examined the patient. The following aspects are pertinent.  Past Medical History  Diagnosis Date  . Hypertension      She has a past hx of essential  . 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 Inflammation of joint of knee     Since we last last saw her she developed problems with an acute which required surgical drainage by her orthopedist Dr. Durward Fortes.  Marland Kitchen MRSA (methicillin resistant Staphylococcus aureus)     Knee surgery drainage was positive for MRSA and she was treated with 3 weeks of doxycycline successfully.  . Diarrhea     Mild  . Exogenous obesity   . GERD (gastroesophageal reflux disease)     2 hosp.- ischemic colitis - residual of Norovirus, 05/2011- sm. bowel obstruction  . Headache(784.0)     migraine headache on occas, less now than when she was younger   . Arthritis     L hip, back, neck   . History of blood transfusion sept 2013    04/2011- /w ischemic colitis , trouble with matching blood  sept 2013  . Anemia     will see hematology consult prior to surgery, recommended by Dr. Mare Ferrari  . Anemia 12/15/2010  . Ischemic colitis 01/31/2012  . Atrial flutter     during hospitalization, 04/2011- related to anemia & illness/stress   . Pneumonia     04/2011- not hospitalized , pt. denies SOB, changes in chest, breathing  . 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 Dizziness     occasional  . B12 deficiency 02/27/2013  . 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 COPD (chronic obstructive pulmonary disease) 09/26/2013   Social History   Social History  . Marital Status: Single    Spouse Name: N/A  . Number of Children: 3  . Years of Education: N/A   Occupational History  .      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 Smoking status: Former Smoker -- 0.25 packs/day for 20 years    Types: Cigarettes    Quit date: 03/21/2013  . Smokeless tobacco: Never Used  . Alcohol Use: No     Comment: 1 - 2 glasses of wine per week  . Drug Use: No  . Sexual  Activity: Not Currently   Other Topics Concern  . None   Social History Narrative   Family History  Problem Relation Age of Onset  . Stroke Father   . Atrial fibrillation Father   . Hypertension Mother    Scheduled Meds: . antiseptic oral rinse  7 mL Mouth Rinse QID  . arformoterol  15 mcg Nebulization BID  . budesonide (PULMICORT) nebulizer solution  0.5 mg Nebulization BID  . chlorhexidine gluconate  15 mL Mouth Rinse BID  . etomidate  40 mg Intravenous Once  . feeding supplement (NEPRO CARB STEADY)  1,000 mL Oral Q24H  . feeding supplement (PRO-STAT SUGAR FREE 64)  30 mL Per Tube BID  . fluconazole (DIFLUCAN) IV  400 mg Intravenous Q1400  . imipenem-cilastatin  500 mg Intravenous Q6H  . linezolid  600 mg Intravenous Q12H  . metronidazole  500 mg  Intravenous 3 times per day  . pantoprazole (PROTONIX) IV  40 mg Intravenous Q24H  . promethazine  6.25 mg Intravenous Once  . sodium chloride  10-40 mL Intracatheter Q12H  . vecuronium  10 mg Intravenous Once   Continuous Infusions: . amiodarone 30 mg/hr (01/11/15 0428)  . calcium gluconate infusion for CRRT 20 g (01/11/15 0700)  . dextrose Stopped (01/09/15 1631)  . fentaNYL infusion INTRAVENOUS 225 mcg/hr (01/11/15 0900)  . phenylephrine (NEO-SYNEPHRINE) Adult infusion 70 mcg/min (01/11/15 1000)  . dialysis replacement fluid (prismasate) Stopped (01/11/15 0100)  . dialysis replacement fluid (prismasate) 200 mL/hr at 01/11/15 0148  . dialysate (PRISMASATE) 1,800 mL/hr at 01/11/15 0958  . sodium citrate 2 %/dextrose 2.5% solution 3000 mL 340 mL/hr at 01/11/15 1206   PRN Meds:.albuterol, albuterol, alteplase, fentaNYL (SUBLIMAZE) injection, heparin, metoprolol, [DISCONTINUED] ondansetron **OR** ondansetron (ZOFRAN) IV, sodium chloride, sodium chloride Medications Prior to Admission:  Prior to Admission medications   Medication Sig Start Date End Date Taking? Authorizing Provider  albuterol (PROVENTIL HFA;VENTOLIN HFA) 108 (90  BASE) MCG/ACT inhaler Inhale 1-2 puffs into the lungs every 6 (six) hours as needed for wheezing or shortness of breath. 08/08/14  Yes Deneise Lever, MD  albuterol (PROVENTIL) (2.5 MG/3ML) 0.083% nebulizer solution Take 3 mLs (2.5 mg total) by nebulization every 6 (six) hours as needed for wheezing or shortness of breath. 12/08/14  Yes Deneise Lever, MD  aspirin 325 MG EC tablet Take 325 mg by mouth every morning.    Yes Historical Provider, MD  bisoprolol (ZEBETA) 5 MG tablet TAKE 1 TABLET BY MOUTH ONCE DAILY BEFORE EATING YOUR BREAKFAST Patient taking differently: TAKE 5 MG BY MOUTH ONCE DAILY BEFORE EATING YOUR BREAKFAST 11/11/14  Yes Kari Coco, MD  buPROPion (WELLBUTRIN XL) 300 MG 24 hr tablet Take 1 tablet (300 mg total) by mouth daily before breakfast. 05/27/14  Yes Kari Coco, MD  cefUROXime (CEFTIN) 250 MG tablet Take 1 tablet (250 mg total) by mouth 2 (two) times daily with a meal. 12/08/14  Yes Deneise Lever, MD  celecoxib (CELEBREX) 200 MG capsule Take 200 mg by mouth daily.  12/25/13  Yes Historical Provider, MD  cyclobenzaprine (FLEXERIL) 10 MG tablet Take 10 mg by mouth 3 (three) times daily as needed for muscle spasms.  07/12/13  Yes Historical Provider, MD  diltiazem (CARDIZEM CD) 120 MG 24 hr capsule Take 1 capsule (120 mg total) by mouth daily. 05/27/14  Yes Kari Coco, MD  diphenoxylate-atropine (LOMOTIL) 2.5-0.025 MG per tablet Take 1 tablet by mouth daily as needed (for diarrhea).  12/29/13  Yes Historical Provider, MD  esomeprazole (NEXIUM) 40 MG capsule Take 40 mg by mouth daily at 12 noon.   Yes Historical Provider, MD  estradiol (ESTRACE) 2 MG tablet Take 2 mg by mouth daily before breakfast.    Yes Historical Provider, MD  furosemide (LASIX) 40 MG tablet Take 40 mg by mouth daily as needed (for fluid).    Yes Historical Provider, MD  HYDROcodone-acetaminophen (NORCO) 10-325 MG per tablet Take 1 tablet by mouth every 4 (four) hours as needed (for pain).  11/15/13   Yes Historical Provider, MD  loratadine-pseudoephedrine (CLARITIN-D 12-HOUR) 5-120 MG per tablet Take 1 tablet by mouth daily as needed for allergies.    Yes Historical Provider, MD  Nebulizers (COMPRESSOR NEBULIZER) MISC 1 Device by Does not apply route 4 (four) times daily as needed. 12/08/14  Yes Deneise Lever, MD  ondansetron (ZOFRAN) 4 MG tablet Take 4 mg by  mouth every 8 (eight) hours as needed for nausea or vomiting.   Yes Historical Provider, MD  Respiratory Therapy Supplies (FLUTTER) DEVI Blow through 4 times per set, three times daily 12/08/14  Yes Deneise Lever, MD  Azelastine-Fluticasone [facility]) 137-50 MCG/ACT SUSP Place 2 sprays into both nostrils at bedtime. Patient not taking: Reported on 01/06/2015 06/12/14   Deneise Lever, MD  potassium chloride 20 MEQ TBCR Take 20 mEq by mouth 3 (three) times daily. Patient not taking: Reported on 12/11/2014 12/22/13   Verlee Monte, MD   Allergies  Allergen Reactions  . Vancomycin Hives and Rash    wheezing  . Ativan [Lorazepam] Other (See Comments)    "makes me crazy"  . Codeine Nausea And Vomiting  . Digoxin And Related Swelling  . Tetanus Toxoids Other (See Comments)    Serum reaction  . Penicillins Hives and Rash  . Xarelto [Rivaroxaban] Hives and Rash    (May not be allergic. Vancomycin was also being taken when reaction occurred.)   CBC:    Component Value Date/Time   WBC 29.7* 01/11/2015 0400   WBC 10.2 12/11/2014 1049   HGB 7.8* 01/11/2015 0755   HGB 10.6* 12/11/2014 1049   HCT 23.0* 01/11/2015 0755   HCT 32.8* 12/11/2014 1049   PLT 308 01/11/2015 0400   PLT 241 12/11/2014 1049   MCV 89.8 01/11/2015 0400   MCV 100.9 12/11/2014 1049   NEUTROABS 20.4* 01/11/2015 0400   NEUTROABS 4.7 12/11/2014 1049   LYMPHSABS 4.2* 01/11/2015 0400   LYMPHSABS 2.4 12/11/2014 1049   MONOABS 5.1* 01/11/2015 0400   MONOABS 2.9* 12/11/2014 1049   EOSABS 0.0 01/11/2015 0400   EOSABS 0.1 12/11/2014 1049   BASOSABS 0.0 01/11/2015 0400    BASOSABS 0.1 12/11/2014 1049   Comprehensive Metabolic Panel:    Component Value Date/Time   NA 132* 01/11/2015 0755   NA 133* 08/28/2013 1211   K 3.8 01/11/2015 0755   K 3.7 08/28/2013 1211   CL 86* 01/11/2015 0755   CL 101 02/09/2012 1321   CO2 35* 01/11/2015 0400   CO2 36* 01/11/2015 0400   CO2 16* 08/28/2013 1211   BUN 9 01/11/2015 0755   BUN 8.8 08/28/2013 1211   CREATININE 0.70 01/11/2015 0755   CREATININE 0.8 08/28/2013 1211   CREATININE 0.92 08/10/2012 1528   GLUCOSE 124* 01/11/2015 0755   GLUCOSE 79 08/28/2013 1211   GLUCOSE 79 02/09/2012 1321   CALCIUM 10.1 01/11/2015 0400   CALCIUM 10.2 01/11/2015 0400   CALCIUM 5.3* 11/19/2013 1000   CALCIUM 6.7* 08/28/2013 1211   AST 30 01/03/2015 1020   AST 19 08/28/2013 1211   ALT 13* 01/03/2015 1020   ALT 14 08/28/2013 1211   ALKPHOS 163* 01/03/2015 1020   ALKPHOS 495* 08/28/2013 1211   BILITOT 0.8 01/03/2015 1020   BILITOT 0.37 08/28/2013 1211   PROT 4.7* 01/03/2015 1020   PROT 6.9 08/28/2013 1211   ALBUMIN 1.7* 01/11/2015 0400   ALBUMIN 2.4* 08/28/2013 1211    Physical Exam: Vital Signs: BP 126/64 mmHg  Pulse 79  Temp(Src) 98 F (36.7 C) (Axillary)  Resp 16  Ht 5\' 5"  (1.651 m)  Wt 77.3 kg (170 lb 6.7 oz)  BMI 28.36 kg/m2  SpO2 95% SpO2: SpO2: 95 % O2 Device: O2 Device: Ventilator O2 Flow Rate: O2 Flow Rate (L/min): 5 L/min Intake/output summary:  Intake/Output Summary (Last 24 hours) at 01/11/15 1225 Last data filed at 01/11/15 1200  Gross per 24 hour  Intake 6629.47  ml  Output   7334 ml  Net -554.16 ml   LBM: Last BM Date: 01/11/15 Baseline Weight: Weight: 60.1 kg (132 lb 7.9 oz) Most recent weight: Weight: 77.3 kg (170 lb 6.7 oz)  Exam Findings:  Pale appearing lady awake not alert Trach Diffuse edema Lungs clear shallow anteriorly Abdomen distended S1S2                       Palliative Performance Scale: 10% Additional Data Reviewed: Recent Labs     01/10/15  0530   01/11/15  0400    01/11/15  0747  01/11/15  0755  WBC  21.8*   --   29.7*   --    --    --   HGB  7.2*   < >  7.0*   < >  8.2*  7.8*  PLT  281   --   308   --    --    --   NA  132*  133*   < >  135  135   < >  134*  132*  BUN  13  13   < >  11  11   < >  7  9  CREATININE  0.66  0.64   < >  0.66  0.70   < >  0.60  0.70   < > = values in this interval not displayed.     Time In: 1100 Time Out: 1230 Time Total: 90 min  Greater than 50%  of this time was spent counseling and coordinating care related to the above assessment and plan.  Signed by: Loistine Chance, MD Martin, MD  01/11/2015, 12:25 PM  Please contact Palliative Medicine Team phone at [phone] for questions and concerns.

## 2015-01-11 NOTE — Progress Notes (Signed)
CPT not done patient in pain. Also family requesting to hold CPT for now. RN at bedside and aware of families wish to hold CPT.

## 2015-01-11 NOTE — Progress Notes (Signed)
Per Pt and family wishes RT will hold CPT.  RT to monitor and assess as needed.

## 2015-01-11 NOTE — Progress Notes (Addendum)
PULMONARY / CRITICAL CARE MEDICINE   Name: [name] MRN: [id number] DOB: [birth year]    ADMISSION DATE:  12/18/2014 CONSULTATION DATE:  12/23/2014  REFERRING MD :  Dr. Karleen Hampshire   CHIEF COMPLAINT:  Shortness of breath   INITIAL PRESENTATION:  64 yo female smoker with dyspnea, VDRF from pneumococcal PNA and septic shock.  She has hx of HTN, GERD, PAF, CMML, COPD.  Prolonged hospital course complicated by respiratory failure in the setting of PNA initially, then pulmonary edema w AF RVR, failed extubation, bradycardic arrest, anemia, abdominal pain with spontaneous bleeding into fluid collections / intraperitoneal hemorrhage on CT abd and acute renal failure requiring CVVHD.   STUDIES:  9/05  CT Chest w/o >> diffuse bilateral airspace disease, consolidation involving all lobes of L/R lung, several discrete nodules present in upper lobes, bulky mediastinal adenopathy 9/16  CT Abd/Pelvis >> fluid collections noted along the R anterior / lateral abdominal wall extending from the RUQ to the R lower lateral abdomen above the iliac crest - ? Seromas from prior hemorrhage with hx of anemia, ? Infected fluid collections, measurement shows these collections to be the density of water.  Anasarca with ascites, small pleural effusions, atx in lower lobes, partial atx of RML, mild splenomegaly 9/17 TTE >> LV normal in size. Moderate concentric LVH. Appearance consistent with hypertrophic cardiomyopathy. EF 80-85%. No regional wall motion abnormalities. Grade 1 diastolic dysfunction. Dynamic obstruction. No significant change from prior echocardiogram except for increased EF. No pericardial effusion. IVC normal in size. Pulmonary artery systolic pressure normal. RV normal in size and function. 9/20  CT Abd/Pelvis >> interval hemorrhage into previously described large fluid collections within the right anterior / lateral abdominal wall, diffuse anasarca, moderate ascites, pulmonary consolidation within the bilateral  lower lobes with adjacent small effusions, mild splenomegaly  SIGNIFICANT EVENTS: 9/05  Admit with worsening SOB  9/06  intubated for hypoxia while receiving fluid bolus ? Edema, levo gtt 9/10  Extubated, transfuse 1 unit PRBC 9/11  A fib with RVR >> started amiodarone/levophed, respiratory failure >> re-intubated 9/12  mottled (hx of Raynauds) ECHO pending. Appropriate. Scr bumped.  9/13  Diagnostic/Therapeutic right Thoracentesis. 650 ML clear appearing. Transudate by [facility]  9/14  lasix for neg volume, weaning.  9/15  lasix and albumin. Passed SBT but had sig WOB and tachycardia.  9/16  failed SBT. Still positive volume in spite of lasix. Still on low dose pressors but may be r/t diprivan. Albumin/lasix x 3 doses. Family updated. Will likely need trach. Anemic. Getting 2 units blood.  CT of abd with fluid collection that appears to be the density of water 9/17  Bradycardic arrest after N/V & probable aspiration.  ACLS. 9/19  Anemia, tx 2 units.  Repeat CT found bleeding into fluid collection, hypoglycemia, AMS with pH 6.9, vent changes / changed vent out and repeat 7.4 9/20  Anemia noted overnight, transfused one unit PRBC, hypoglycemia, period of AMS with acidosis, vent changed out and rate adjusted with improvement.  Off levophed.  CVVHD started.  9/22  Trach per DF  SUBJECTIVE:   Very uncomfortable. Seems to be in severe pain.  VITAL SIGNS: Temp:  [98 F (36.7 C)-99.8 F (37.7 C)] 98 F (36.7 C) (09/25 0800) Pulse Rate:  [33-114] 51 (09/25 1000) Resp:  [16-23] 20 (09/25 1030) BP: (75-158)/(31-114) 135/84 mmHg (09/25 1030) SpO2:  [1 %-100 %] 99 % (09/25 1030) FiO2 (%):  [40 %] 40 % (09/25 0728) Weight:  [170 lb 6.7 oz (77.3  kg)] 170 lb 6.7 oz (77.3 kg) (09/25 0405)      VENTILATOR SETTINGS: Vent Mode:  [-] PRVC FiO2 (%):  [40 %] 40 % Set Rate:  [20 bmp] 20 bmp Vt Set:  [500 mL] 500 mL PEEP:  [5 cmH20] 5 cmH20 Plateau Pressure:  [18 cmH20-24 cmH20] 23 cmH20    INTAKE / OUTPUT:  Intake/Output Summary (Last 24 hours) at 01/11/15 1107 Last data filed at 01/11/15 1000  Gross per 24 hour  Intake 6723.04 ml  Output   7008 ml  Net -284.96 ml    PHYSICAL EXAMINATION: General: Moderate distress. Winces with pain whenever touched. HEENT:  PERRL, Moist mucus membranes Cardiovascular:  S1, S2, No MRG Pulmonary:  Clear antr. Abdomen: Soft, non tender, non distended. Neurological:  No gross focal deficits.   LABS:  CBC  Recent Labs Lab 01/09/15 0440  01/10/15 0530  01/11/15 0400  01/11/15 0546 01/11/15 0747 01/11/15 0755  WBC 29.4*  --  21.8*  --  29.7*  --   --   --   --   HGB 8.3*  < > 7.2*  < > 7.0*  < > 7.8* 8.2* 7.8*  HCT 25.0*  < > 22.2*  < > 22.0*  < > 23.0* 24.0* 23.0*  PLT 287  --  281  --  308  --   --   --   --   < > = values in this interval not displayed. BMET  Recent Labs Lab 01/10/15 0530  01/10/15 1530  01/11/15 0400  01/11/15 0546 01/11/15 0747 01/11/15 0755  NA 132*  133*  < > 134*  < > 135  135  < > 133* 134* 132*  K 3.6  3.7  < > 3.6  < > 3.9  3.9  < > 3.7 3.7 3.8  CL 88*  89*  < > 87*  < > 90*  91*  < > 86* 87* 86*  CO2 33*  33*  --  36*  --  36*  35*  --   --   --   --   BUN 13  13  < > 11  < > 11  11  < > 9 7 9   CREATININE 0.66  0.64  < > 0.71  < > 0.66  0.70  < > 0.80 0.60 0.70  GLUCOSE 110*  108*  < > 129*  < > 124*  121*  < > 121* 151* 124*  < > = values in this interval not displayed.   Electrolytes  Recent Labs Lab 01/09/15 2359 01/10/15 0530 01/10/15 1530 01/11/15 0400  CALCIUM  --  10.7*  10.9* 11.4* 10.2  10.1  MG 1.7 1.7  --  1.8  PHOS  --  1.8*  1.9* 1.9* 1.8*  1.8*     Sepsis Markers  Recent Labs Lab 01/05/15 0434  PROCALCITON 24.77   ABG  Recent Labs Lab 01/05/15 2035 01/05/15 2335 01/07/15 1123  PHART 6.992* 7.414 7.352  PCO2ART 85.4* 27.4* 37.7  PO2ART 89.4 115* 68.4*    Liver Enzymes  Recent Labs Lab 01/10/15 0530 01/10/15 1530  01/11/15 0400  ALBUMIN 1.6* 1.6* 1.7*     Cardiac Enzymes No results for input(s): TROPONINI, PROBNP in the last 168 hours.  Glucose  Recent Labs Lab 01/10/15 0111 01/10/15 0442 01/10/15 0756 01/10/15 1233 01/10/15 1612 01/11/15 0044  GLUCAP 117* 112* 112* 112* 115* 100*    Imaging Dg Chest Port 1 View  01/11/2015  CLINICAL DATA:  Acute respiratory failure  EXAM: PORTABLE CHEST 1 VIEW  COMPARISON:  01/10/2015  FINDINGS: Multifocal patchy opacities in the left upper lobe and bilateral lower lobes, suspicious for moderate interstitial edema, underlying infection (particularly in the right lower lobe) not excluded.  Associated moderate bilateral pleural effusions, right greater than left. No pneumothorax.  Cardiomegaly.  Tracheostomy in satisfactory position. Right arm PICC terminates in the lower SVC. Left IJ venous catheter terminates in the mid SVC. Enteric tube courses below the diaphragm.  IMPRESSION: Tracheostomy in satisfactory position.  Multifocal patchy opacities, favored to reflect moderate interstitial edema, grossly unchanged. Underlying infection not excluded.  Moderate bilateral pleural effusions, right greater than left.  Additional support apparatus as above.   Electronically Signed   By: Julian Hy M.D.   On: 01/11/2015 07:27    ASSESSMENT / PLAN:  PULMONARY ETT 9/06 >> 9/10 ETT 9/11 >> 9/22 Trach 9/22 >>   A: Acute Hypoxic Respiratory Failure - Multifactorial from Pneumonia, Volume Overload & acute aspiration event (9/17) Pulmonary Edema  Pink/Frothy Sputum - resolved Bronchospasm Bilateral Pleural Effusions - S/P Thoracentesis/Transudate. Presumed COPD - H/O Tobacco Use. Hypercarbia - 9/19 ABG 6.9, pco2 85  P:   Trached 9/22 SBT as tolerated  Scheduled Duonebs Budesonide nebs Wean PEEP / FiO2 for sats > 90% Intermittent CXR  CARDIOVASCULAR Rt PICC 9/06 >> A:  Bradycardic Arrest 9/17 - Likely due to aspiration. A-Fib with RVR- converted to  NSR. Currently ST.  Shock - Likely ongoing due to sedation, On neo drip 9/23 with sedation contributing to hypotension. H/O hypertension  P:  Telemetry monitoring  Holding ASA SCD's only with bleeding Wean sedation as tolerated. Neo for hypotension.  RENAL A:   Acute Renal Failure - worsening with anuria 9/20. Hypervolemia - due to hypoalbuminemia Metabolic acidosis  Hypokalemia - treat Hypomagnesemia -treat. Hyponatremia -treat  P:   Trend BMP / [place]  Nephrology following, appreciate input Replace electrolytes as indicated  CVVHD per Nephrology.   GASTROINTESTINAL A:   Abdominal Hemorrhage into Fluid Collections on R / Intraperitoneal Hemorrhage Chronic diarrhea with C diff colonization Hypoalbuminemia  N/V - Holding tube feedings Hx of GERD  P:   Tolerating tube feeds. At goal. Protonix IV daily Zofran IV prn See ID Not a surgical candidate.  HEMATOLOGIC  Recent Labs  01/11/15 0747 01/11/15 0755  HGB 8.2* 7.8*    A:   Anemia - No signs of active bleeding on CT. S/P 2u PRBC 9/16. & 1u PRBC 9/17. Hx of CMML with chronic anemia.  P:  Trend Hgb daily w/ CBC SCDs Transfuse for Hgb <7 Minimize blood draws as able   INFECTIOUS A:   Septic shock - Secondary to Pneumonia & questionable bacteremia. Questionable Streptococcal Pneumonia - Completed Fortaz 9/14 Streptococcal Viridans Bacteremia - Completed Fortaz 9/14  Cdiff colonization Enterobacter C resistant 9/23 changed to primaxin  P:   Consider ID consult  Linezolid 9/17 >> 9/19 Aztreonam 9/17 >> 9/23 Flagyl IV 9/17 >> Primaxin 9/23>> Linezolid 9/22>>  Blood Ctx 9/17 >> Urine Ctx 9/17 >> enterococcus >> sens vanco Resp Ctx 9/17 >> enterobacter cloacae >>resistant to aztreonam Resp cx 9/22>> Blood Ctx 9/13 >> negative  R Pleural Effusion 9/13 >> negative  BAL 9/7>>Bacteria Neg/AFB pending/Fungal pending  Blood Ctx 9/5 >> 1/2 positive for Strep Viridans Urine Strep Ag 9/5 >>  negative Urine Leg Ag 9/5 >> negative  ENDOCRINE CBG (last 3)   Recent Labs  01/10/15 1233 01/10/15 1612 01/11/15 0044  GLUCAP 112* 115* 100*     A: Hyperglycemia  P: Monitor BG & RN to notify for <90 or >160 Off D10  NEUROLOGY A: Chest Pain post resuscitation H/O Chronic pain Depression Vent tolearance  P: RASS goal: 0 to -1 Fentanyl IV prn + diprivan. Consider weaning sedation during day and work on weaning.  TODAYS SUMMARY:  64 year old female with atrial fibrillation and pneumonia with repeat intubation due to hypoxic respiratory failure. Patient has had failure to wean likely owing to pulmonary edema from hypoalbuminemic state. Patient suffered bradycardic arrest on 9/17 after episode of emesis. Patient had no evidence of bowel obstruction or residual tube feeds prior to this event. With elevation of Procalcitonin and no obvious source for shock otherwise in the face of leukocytosis broad-spectrum antibiotics were restarted after repeating cultures. Hgb dropped overnight 9/19.  Repeat CT abd showed bleeding into prior fluid collections. CVVHD initiated on 9/20.  Clinical prognosis remains guarded given multiple medical problems. Family very involved in patients care.  Continue to request full code status.   Trach performed 9/22. 9/23 she continues on trach/vent. Constant pain is an issue.  Very poor prognosis for recovery. She appears to be is constant severe pain. Agree with renal that we need to have a goals of care discussion. Appreciate their input. Palliative care consulted. Son and daughter in law updated 9/25. Sister who is the health care proxy will arrive later today.  [name] MD [facility] Pager [phone] If no answer or after 3pm call: [phone] 01/11/2015, 11:07 AM

## 2015-01-11 NOTE — Progress Notes (Addendum)
[facility] Progress Note   Subjective: UF net negative 1.3 L yesterday. No UOP.  Sedation gtt had to be increased due to pain. WBC up 29k, pressors at 75 ug/min of neo.   Filed Vitals:   01/11/15 0700 01/11/15 0726 01/11/15 0800 01/11/15 0900  BP: 113/93  111/73 108/78  Pulse:   72 114  Temp:   98 F (36.7 C)   TempSrc:   Axillary   Resp: 20  20 20   Height:      Weight:      SpO2:  97% 99% 100%   Exam: Opens eyes to voice then falls back asleep , not following commands No JVD Chest scattered rhonchi RRR no MRG Abd soft ntnd large mid-abd scar healed over, RLQ ostomy 2+ LE edema bilat Neuro as above   Assessment: 1 Acute kidney injury - ATN due to shock/ cardiac arrest. D#6 CRRT. 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  Doubt that continued aggressive care (CRRT, etc.) will change the outcome.  Have asked palliative care physician for assistance, she will see today.       Kelly Splinter MD  pager [phone]    cell [phone]  01/11/2015, 9:20 AM     Recent Labs Lab 01/10/15 0530  01/10/15 1530  01/11/15 0400  01/11/15 0546 01/11/15 0747 01/11/15 0755  NA 132*  133*  < > 134*  < > 135  135  < > 133* 134* 132*  K 3.6  3.7  < > 3.6  < > 3.9  3.9  < > 3.7 3.7 3.8  CL 88*  89*  < > 87*  < > 90*  91*  < > 86* 87* 86*  CO2 33*  33*  --  36*  --  36*  35*  --   --   --   --   GLUCOSE 110*  108*  < > 129*  < > 124*  121*  < > 121* 151* 124*  BUN 13  13  < > 11  < > 11  11  < > 9 7 9   CREATININE 0.66  0.64  < > 0.71  < > 0.66  0.70  <  > 0.80 0.60 0.70  CALCIUM 10.7*  10.9*  --  11.4*  --  10.2  10.1  --   --   --   --   PHOS 1.8*  1.9*  --  1.9*  --  1.8*  1.8*  --   --   --   --   < > = values in this interval not displayed.  Recent Labs Lab 01/10/15 0530 01/10/15 1530 01/11/15 0400  ALBUMIN 1.6* 1.6* 1.7*    Recent Labs Lab 01/09/15 0440  01/10/15 0530  01/11/15 0400  01/11/15 0546 01/11/15 0747 01/11/15 0755  WBC 29.4*  --  21.8*  --  29.7*  --   --   --   --   NEUTROABS 20.5*  --  13.8*  --  20.4*  --   --   --   --  HGB 8.3*  < > 7.2*  < > 7.0*  < > 7.8* 8.2* 7.8*  HCT 25.0*  < > 22.2*  < > 22.0*  < > 23.0* 24.0* 23.0*  MCV 87.7  --  89.5  --  89.8  --   --   --   --   PLT 287  --  281  --  308  --   --   --   --   < > = values in this interval not displayed. Marland Kitchen antiseptic oral rinse  7 mL Mouth Rinse QID  . arformoterol  15 mcg Nebulization BID  . budesonide (PULMICORT) nebulizer solution  0.5 mg Nebulization BID  . chlorhexidine gluconate  15 mL Mouth Rinse BID  . etomidate  40 mg Intravenous Once  . feeding supplement (NEPRO CARB STEADY)  1,000 mL Oral Q24H  . feeding supplement (PRO-STAT SUGAR FREE 64)  30 mL Per Tube BID  . fluconazole (DIFLUCAN) IV  400 mg Intravenous Q1400  . imipenem-cilastatin  500 mg Intravenous Q6H  . linezolid  600 mg Intravenous Q12H  . metronidazole  500 mg Intravenous 3 times per day  . pantoprazole (PROTONIX) IV  40 mg Intravenous Q24H  . promethazine  6.25 mg Intravenous Once  . sodium chloride  10-40 mL Intracatheter Q12H  . vecuronium  10 mg Intravenous Once   . amiodarone 30 mg/hr (01/11/15 0428)  . calcium gluconate infusion for CRRT 20 g (01/11/15 0700)  . dextrose Stopped (01/09/15 1631)  . fentaNYL infusion INTRAVENOUS 200 mcg/hr (01/11/15 0800)  . phenylephrine (NEO-SYNEPHRINE) Adult infusion 75 mcg/min (01/11/15 0800)  . dialysis replacement fluid (prismasate) Stopped (01/11/15 0100)  . dialysis replacement fluid (prismasate) 200 mL/hr at  01/11/15 0148  . dialysate (PRISMASATE) 1,800 mL/hr at 01/11/15 0705  . sodium citrate 2 %/dextrose 2.5% solution 3000 mL 290 mL/hr at 01/11/15 0544   albuterol, albuterol, alteplase, fentaNYL (SUBLIMAZE) injection, heparin, metoprolol, [DISCONTINUED] ondansetron **OR** ondansetron (ZOFRAN) IV, sodium chloride, sodium chloride

## 2015-01-12 ENCOUNTER — Inpatient Hospital Stay (HOSPITAL_COMMUNITY): Payer: 59

## 2015-01-12 DIAGNOSIS — Z66 Do not resuscitate: Secondary | ICD-10-CM

## 2015-01-12 DIAGNOSIS — R0602 Shortness of breath: Secondary | ICD-10-CM | POA: Insufficient documentation

## 2015-01-12 LAB — POCT I-STAT, CHEM 8
BUN: 6 mg/dL (ref 6–20)
BUN: 6 mg/dL (ref 6–20)
BUN: 7 mg/dL (ref 6–20)
BUN: 7 mg/dL (ref 6–20)
BUN: 7 mg/dL (ref 6–20)
BUN: 8 mg/dL (ref 6–20)
BUN: 9 mg/dL (ref 6–20)
BUN: 9 mg/dL (ref 6–20)
BUN: 9 mg/dL (ref 6–20)
BUN: 9 mg/dL (ref 6–20)
CALCIUM ION: 0.35 mmol/L — AB (ref 1.13–1.30)
CALCIUM ION: 0.36 mmol/L — AB (ref 1.13–1.30)
CALCIUM ION: 0.38 mmol/L — AB (ref 1.13–1.30)
CALCIUM ION: 0.38 mmol/L — AB (ref 1.13–1.30)
CALCIUM ION: 0.78 mmol/L — AB (ref 1.13–1.30)
CALCIUM ION: 0.81 mmol/L — AB (ref 1.13–1.30)
CALCIUM ION: 0.83 mmol/L — AB (ref 1.13–1.30)
CALCIUM ION: 0.9 mmol/L — AB (ref 1.13–1.30)
CHLORIDE: 88 mmol/L — AB (ref 101–111)
CHLORIDE: 88 mmol/L — AB (ref 101–111)
CHLORIDE: 89 mmol/L — AB (ref 101–111)
CHLORIDE: 89 mmol/L — AB (ref 101–111)
CHLORIDE: 92 mmol/L — AB (ref 101–111)
CREATININE: 0.4 mg/dL — AB (ref 0.44–1.00)
CREATININE: 0.5 mg/dL (ref 0.44–1.00)
CREATININE: 0.5 mg/dL (ref 0.44–1.00)
CREATININE: 0.5 mg/dL (ref 0.44–1.00)
CREATININE: 0.5 mg/dL (ref 0.44–1.00)
CREATININE: 0.5 mg/dL (ref 0.44–1.00)
CREATININE: 0.6 mg/dL (ref 0.44–1.00)
CREATININE: 0.7 mg/dL (ref 0.44–1.00)
Calcium, Ion: 0.85 mmol/L — ABNORMAL LOW (ref 1.13–1.30)
Calcium, Ion: 0.39 mmol/L — CL (ref 1.13–1.30)
Chloride: 88 mmol/L — ABNORMAL LOW (ref 101–111)
Chloride: 88 mmol/L — ABNORMAL LOW (ref 101–111)
Chloride: 89 mmol/L — ABNORMAL LOW (ref 101–111)
Chloride: 89 mmol/L — ABNORMAL LOW (ref 101–111)
Chloride: 89 mmol/L — ABNORMAL LOW (ref 101–111)
Creatinine, Ser: 0.6 mg/dL (ref 0.44–1.00)
Creatinine, Ser: 0.5 mg/dL (ref 0.44–1.00)
GLUCOSE: 114 mg/dL — AB (ref 65–99)
GLUCOSE: 119 mg/dL — AB (ref 65–99)
GLUCOSE: 123 mg/dL — AB (ref 65–99)
GLUCOSE: 124 mg/dL — AB (ref 65–99)
GLUCOSE: 128 mg/dL — AB (ref 65–99)
GLUCOSE: 166 mg/dL — AB (ref 65–99)
GLUCOSE: 167 mg/dL — AB (ref 65–99)
GLUCOSE: 167 mg/dL — AB (ref 65–99)
GLUCOSE: 168 mg/dL — AB (ref 65–99)
GLUCOSE: 178 mg/dL — AB (ref 65–99)
HCT: 22 % — ABNORMAL LOW (ref 36.0–46.0)
HCT: 22 % — ABNORMAL LOW (ref 36.0–46.0)
HCT: 25 % — ABNORMAL LOW (ref 36.0–46.0)
HCT: 25 % — ABNORMAL LOW (ref 36.0–46.0)
HCT: 24 % — ABNORMAL LOW (ref 36.0–46.0)
HCT: 36 % (ref 36.0–46.0)
HCT: 22 % — ABNORMAL LOW (ref 36.0–46.0)
HCT: 25 % — ABNORMAL LOW (ref 36.0–46.0)
HEMATOCRIT: 23 % — AB (ref 36.0–46.0)
HEMATOCRIT: 23 % — AB (ref 36.0–46.0)
HEMOGLOBIN: 7.5 g/dL — AB (ref 12.0–15.0)
HEMOGLOBIN: 12.2 g/dL (ref 12.0–15.0)
HEMOGLOBIN: 8.5 g/dL — AB (ref 12.0–15.0)
Hemoglobin: 7.5 g/dL — ABNORMAL LOW (ref 12.0–15.0)
Hemoglobin: 7.5 g/dL — ABNORMAL LOW (ref 12.0–15.0)
Hemoglobin: 7.8 g/dL — ABNORMAL LOW (ref 12.0–15.0)
Hemoglobin: 7.8 g/dL — ABNORMAL LOW (ref 12.0–15.0)
Hemoglobin: 8.2 g/dL — ABNORMAL LOW (ref 12.0–15.0)
Hemoglobin: 8.5 g/dL — ABNORMAL LOW (ref 12.0–15.0)
Hemoglobin: 8.5 g/dL — ABNORMAL LOW (ref 12.0–15.0)
POTASSIUM: 3.3 mmol/L — AB (ref 3.5–5.1)
POTASSIUM: 3.6 mmol/L (ref 3.5–5.1)
Potassium: 3.3 mmol/L — ABNORMAL LOW (ref 3.5–5.1)
Potassium: 3.4 mmol/L — ABNORMAL LOW (ref 3.5–5.1)
Potassium: 3.4 mmol/L — ABNORMAL LOW (ref 3.5–5.1)
Potassium: 3.4 mmol/L — ABNORMAL LOW (ref 3.5–5.1)
Potassium: 3.5 mmol/L (ref 3.5–5.1)
Potassium: 3.5 mmol/L (ref 3.5–5.1)
Potassium: 3.5 mmol/L (ref 3.5–5.1)
Potassium: 3.6 mmol/L (ref 3.5–5.1)
Sodium: 132 mmol/L — ABNORMAL LOW (ref 135–145)
Sodium: 135 mmol/L (ref 135–145)
Sodium: 135 mmol/L (ref 135–145)
Sodium: 135 mmol/L (ref 135–145)
Sodium: 135 mmol/L (ref 135–145)
Sodium: 136 mmol/L (ref 135–145)
Sodium: 136 mmol/L (ref 135–145)
Sodium: 137 mmol/L (ref 135–145)
Sodium: 137 mmol/L (ref 135–145)
Sodium: 137 mmol/L (ref 135–145)
TCO2: 27 mmol/L (ref 0–100)
TCO2: 27 mmol/L (ref 0–100)
TCO2: 27 mmol/L (ref 0–100)
TCO2: 27 mmol/L (ref 0–100)
TCO2: 27 mmol/L (ref 0–100)
TCO2: 28 mmol/L (ref 0–100)
TCO2: 29 mmol/L (ref 0–100)
TCO2: 29 mmol/L (ref 0–100)
TCO2: 29 mmol/L (ref 0–100)
TCO2: 30 mmol/L (ref 0–100)

## 2015-01-12 LAB — CBC WITH DIFFERENTIAL/PLATELET
BASOS PCT: 0 %
Basophils Absolute: 0 10*3/uL (ref 0.0–0.1)
EOS PCT: 1 %
Eosinophils Absolute: 0.2 10*3/uL (ref 0.0–0.7)
HEMATOCRIT: 22.2 % — AB (ref 36.0–46.0)
HEMOGLOBIN: 7.1 g/dL — AB (ref 12.0–15.0)
LYMPHS PCT: 15 %
Lymphs Abs: 3.2 10*3/uL (ref 0.7–4.0)
MCH: 29.6 pg (ref 26.0–34.0)
MCHC: 32 g/dL (ref 30.0–36.0)
MCV: 92.5 fL (ref 78.0–100.0)
MONOS PCT: 18 %
Monocytes Absolute: 3.9 10*3/uL — ABNORMAL HIGH (ref 0.1–1.0)
NEUTROS PCT: 66 %
Neutro Abs: 14.1 10*3/uL — ABNORMAL HIGH (ref 1.7–7.7)
Platelets: 304 10*3/uL (ref 150–400)
RBC: 2.4 MIL/uL — ABNORMAL LOW (ref 3.87–5.11)
RDW: 20.1 % — AB (ref 11.5–15.5)
WBC: 21.4 10*3/uL — ABNORMAL HIGH (ref 4.0–10.5)

## 2015-01-12 LAB — RENAL FUNCTION PANEL
ALBUMIN: 1.7 g/dL — AB (ref 3.5–5.0)
ANION GAP: 10 (ref 5–15)
BUN: 11 mg/dL (ref 6–20)
CALCIUM: 8.1 mg/dL — AB (ref 8.9–10.3)
CO2: 34 mmol/L — ABNORMAL HIGH (ref 22–32)
Chloride: 93 mmol/L — ABNORMAL LOW (ref 101–111)
Creatinine, Ser: 0.58 mg/dL (ref 0.44–1.00)
GFR calc Af Amer: >60 mL/min (ref >60)
Glucose, Bld: 118 mg/dL — ABNORMAL HIGH (ref 65–99)
POTASSIUM: 3.5 mmol/L (ref 3.5–5.1)
Phosphorus: 1.4 mg/dL — ABNORMAL LOW (ref 2.5–4.6)
SODIUM: 137 mmol/L (ref 135–145)

## 2015-01-12 LAB — CALCIUM, IONIZED: Calcium, Ionized, Serum: 4.9 mg/dL (ref 4.5–5.6)

## 2015-01-12 LAB — CULTURE, RESPIRATORY W GRAM STAIN: Special Requests: NORMAL

## 2015-01-12 LAB — BASIC METABOLIC PANEL
Anion gap: 10 (ref 5–15)
BUN: 11 mg/dL (ref 6–20)
CALCIUM: 8.1 mg/dL — AB (ref 8.9–10.3)
CO2: 35 mmol/L — AB (ref 22–32)
CREATININE: 0.54 mg/dL (ref 0.44–1.00)
Chloride: 93 mmol/L — ABNORMAL LOW (ref 101–111)
GFR calc non Af Amer: >60 mL/min (ref >60)
Glucose, Bld: 115 mg/dL — ABNORMAL HIGH (ref 65–99)
Potassium: 3.4 mmol/L — ABNORMAL LOW (ref 3.5–5.1)
Sodium: 138 mmol/L (ref 135–145)

## 2015-01-12 LAB — MAGNESIUM: MAGNESIUM: 1.7 mg/dL (ref 1.7–2.4)

## 2015-01-12 LAB — CULTURE, RESPIRATORY

## 2015-01-12 LAB — APTT: aPTT: 31 seconds (ref 24–37)

## 2015-01-12 MED ORDER — HYDROMORPHONE HCL 1 MG/ML IJ SOLN
1.0000 mg | INTRAMUSCULAR | Status: DC | PRN
  Administered 2015-01-12: 2 mg via INTRAVENOUS
  Filled 2015-01-12 (×2): qty 1

## 2015-01-12 MED ORDER — SODIUM CHLORIDE 0.9 % IV SOLN
2.0000 mg/h | INTRAVENOUS | Status: DC
  Administered 2015-01-12: 2 mg/h via INTRAVENOUS
  Filled 2015-01-12: qty 10

## 2015-01-12 MED ORDER — GLYCOPYRROLATE 0.2 MG/ML IJ SOLN
0.4000 mg | INTRAMUSCULAR | Status: DC | PRN
  Filled 2015-01-12: qty 2

## 2015-01-12 MED ORDER — MIDAZOLAM HCL 2 MG/2ML IJ SOLN
2.0000 mg | INTRAMUSCULAR | Status: DC | PRN
  Administered 2015-01-12: 2 mg via INTRAVENOUS

## 2015-01-12 MED ORDER — MIDAZOLAM HCL 2 MG/2ML IJ SOLN: 2.0000 mg | INTRAMUSCULAR | Status: DC | PRN

## 2015-01-13 LAB — CALCIUM, IONIZED: CALCIUM, IONIZED, SERUM: 3.4 mg/dL — AB (ref 4.5–5.6)

## 2015-01-15 ENCOUNTER — Telehealth: Payer: Self-pay

## 2015-01-15 NOTE — Telephone Encounter (Signed)
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  Will take to [facility] tomorrow, 01-16-15, for signature, per his request.  He has already gone for the day.  Picked up certificate from [employer], faxed and called funeral home for pick-up 01-16-15.

## 2015-01-17 NOTE — Progress Notes (Signed)
RT removed PT from ventilator. Palliative MD present and asked that PT be removed from vent without triturating settings- unevenful. RN aware.

## 2015-01-17 NOTE — Progress Notes (Signed)
PULMONARY / CRITICAL CARE MEDICINE   Name: [name] MRN: [id number] DOB: 1950-09-01    ADMISSION DATE:  01/04/2015 CONSULTATION DATE:  12/23/2014  REFERRING MD :  Dr. Karleen Hampshire   CHIEF COMPLAINT:  Shortness of breath   INITIAL PRESENTATION:  64 yo female smoker with dyspnea, VDRF from pneumococcal PNA and septic shock.  She has hx of HTN, GERD, PAF, CMML, COPD.  Prolonged hospital course complicated by respiratory failure in the setting of PNA initially, then pulmonary edema w AF RVR, failed extubation, bradycardic arrest, anemia, abdominal pain with spontaneous bleeding into fluid collections / intraperitoneal hemorrhage on CT abd and acute renal failure requiring CVVHD.   STUDIES:  9/05  CT Chest w/o >> diffuse bilateral airspace disease, consolidation involving all lobes of L/R lung, several discrete nodules present in upper lobes, bulky mediastinal adenopathy 9/16  CT Abd/Pelvis >> fluid collections noted along the R anterior / lateral abdominal wall extending from the RUQ to the R lower lateral abdomen above the iliac crest - ? Seromas from prior hemorrhage with hx of anemia, ? Infected fluid collections, measurement shows these collections to be the density of water.  Anasarca with ascites, small pleural effusions, atx in lower lobes, partial atx of RML, mild splenomegaly 9/17 TTE >> LV normal in size. Moderate concentric LVH. Appearance consistent with hypertrophic cardiomyopathy. EF 80-85%. No regional wall motion abnormalities. Grade 1 diastolic dysfunction. Dynamic obstruction. No significant change from prior echocardiogram except for increased EF. No pericardial effusion. IVC normal in size. Pulmonary artery systolic pressure normal. RV normal in size and function. 9/20  CT Abd/Pelvis >> interval hemorrhage into previously described large fluid collections within the right anterior / lateral abdominal wall, diffuse anasarca, moderate ascites, pulmonary consolidation within the bilateral  lower lobes with adjacent small effusions, mild splenomegaly  SIGNIFICANT EVENTS: 9/05  Admit with worsening SOB  9/06  intubated for hypoxia while receiving fluid bolus ? Edema, levo gtt 9/10  Extubated, transfuse 1 unit PRBC 9/11  A fib with RVR >> started amiodarone/levophed, respiratory failure >> re-intubated 9/12  mottled (hx of Raynauds) ECHO pending. Appropriate. Scr bumped.  9/13  Diagnostic/Therapeutic right Thoracentesis. 650 ML clear appearing. Transudate by Light Criteria  9/14  lasix for neg volume, weaning.  9/15  lasix and albumin. Passed SBT but had sig WOB and tachycardia.  9/16  failed SBT. Still positive volume in spite of lasix. Still on low dose pressors but may be r/t diprivan. Albumin/lasix x 3 doses. Family updated. Will likely need trach. Anemic. Getting 2 units blood.  CT of abd with fluid collection that appears to be the density of water 9/17  Bradycardic arrest after N/V & probable aspiration.  ACLS. 9/19  Anemia, tx 2 units.  Repeat CT found bleeding into fluid collection, hypoglycemia, AMS with pH 6.9, vent changes / changed vent out and repeat 7.4 9/20  Anemia noted overnight, transfused one unit PRBC, hypoglycemia, period of AMS with acidosis, vent changed out and rate adjusted with improvement.  Off levophed.  CVVHD started.  9/22  Trach per DF  SUBJECTIVE:    Family at bedside.  Comfort measures being instituted.     VITAL SIGNS: Temp:  [97.8 F (36.6 C)-99 F (37.2 C)] 97.8 F (36.6 C) (09/26 0400) Pulse Rate:  [60-135] 71 (09/26 1000) Resp:  [10-22] 18 (09/26 1000) BP: (77-221)/(45-186) 83/64 mmHg (09/26 1000) SpO2:  [80 %-100 %] 100 % (09/26 1000) FiO2 (%):  [40 %] 40 % (09/26 0739) Weight:  [073  lb 10.7 oz (75.6 kg)] 166 lb 10.7 oz (75.6 kg) (09/26 0500)      VENTILATOR SETTINGS: Vent Mode:  [-] PRVC FiO2 (%):  [40 %] 40 % Set Rate:  [20 bmp] 20 bmp Vt Set:  [500 mL] 500 mL PEEP:  [5 cmH20] 5 cmH20 Plateau Pressure:  [23 cmH20-29 cmH20]  28 cmH20   INTAKE / OUTPUT:  Intake/Output Summary (Last 24 hours) at January 24, 2015 1040 Last data filed at 2015/01/24 1000  Gross per 24 hour  Intake 5859.33 ml  Output   6262 ml  Net -402.67 ml    PHYSICAL EXAMINATION: General: ill adult female in NAD HEENT:  PERRL, Moist mucus membranes Cardiovascular:  S1, S2, No MRG Pulmonary:  Clear anteriorly Abdomen: Soft, non tender, non distended. Neurological:  No gross focal deficits.   LABS:  CBC  Recent Labs Lab 01/10/15 0530  01/11/15 0400  24-Jan-2015 0400  01-24-15 0600 January 24, 2015 0749 January 24, 2015 0757  WBC 21.8*  --  29.7*  --  21.4*  --   --   --   --   HGB 7.2*  < > 7.0*  < > 7.1*  8.5*  < > 12.2 8.5* 7.5*  HCT 22.2*  < > 22.0*  < > 22.2*  25.0*  < > 36.0 25.0* 22.0*  PLT 281  --  308  --  304  --   --   --   --   < > = values in this interval not displayed.   BMET  Recent Labs Lab 01/11/15 0400  01/11/15 1608  01-24-15 0400  24-Jan-2015 0600 01-24-2015 0749 January 24, 2015 0757  NA 135  135  < > 137  < > 137  138  135  < > 135 137 132*  K 3.9  3.9  < > 3.6  < > 3.5  3.4*  3.4*  < > 3.3* 3.6 3.5  CL 90*  91*  < > 93*  < > 93*  93*  89*  < > 88* 89* 92*  CO2 36*  35*  --  34*  --  34*  35*  --   --   --   --   BUN 11  11  < > 11  < > 11  11  9   < > 9 6 9   CREATININE 0.66  0.70  < > 0.54  < > 0.58  0.54  0.60  < > 0.50 0.50 0.50  GLUCOSE 124*  121*  < > 134*  < > 118*  115*  114*  < > 128* 168* 119*  < > = values in this interval not displayed.   Electrolytes  Recent Labs Lab 01/10/15 0530  01/11/15 0400 01/11/15 1608 01/24/15 0400  CALCIUM 10.7*  10.9*  < > 10.2  10.1 8.1* 8.1*  8.1*  MG 1.7  --  1.8  --  1.7  PHOS 1.8*  1.9*  < > 1.8*  1.8* 1.7* 1.4*  < > = values in this interval not displayed.   Sepsis Markers No results for input(s): LATICACIDVEN, PROCALCITON, O2SATVEN in the last 168 hours.   ABG  Recent Labs Lab 01/05/15 2035 01/05/15 2335 01/07/15 1123  PHART 6.992* 7.414 7.352   PCO2ART 85.4* 27.4* 37.7  PO2ART 89.4 115* 68.4*    Liver Enzymes  Recent Labs Lab 01/11/15 0400 01/11/15 1608 01/24/2015 0400  ALBUMIN 1.7* 1.7* 1.7*     Cardiac Enzymes No results for input(s): TROPONINI, PROBNP in the last 168  hours.  Glucose  Recent Labs Lab 01/10/15 0111 01/10/15 0442 01/10/15 0756 01/10/15 1233 01/10/15 1612 01/11/15 0044  GLUCAP 117* 112* 112* 112* 115* 100*    Imaging Dg Chest Port 1 View  01-24-15   CLINICAL DATA:  Acute respiratory failure, COPD, but pneumonia, and atrial flutter  EXAM: PORTABLE CHEST 1 VIEW  COMPARISON:  Portable chest x-ray of January 11, 2015  FINDINGS: There are persistent patchy interstitial and early alveolar opacities. There are moderate-sized pleural effusions layering inferiorly and laterally. The cardiac silhouette remains enlarged. The central pulmonary vascularity remains mildly prominent. The tracheostomy appliance tip lies at the level of the clavicular heads 7.2 cm above the carina. The feeding tube tip projects below the inferior margin of the image. The PICC line tip projects over the midportion of the SVC.  IMPRESSION: Stable appearance of the bilateral interstitial and early alveolar opacities and a pleural effusions. The findings likely reflect CHF with interstitial edema though pneumonia is not excluded. The support tubes are in appropriate position.   Electronically Signed   By: David  Martinique M.D.   On: 2015-01-24 07:30    ASSESSMENT / PLAN:  PULMONARY ETT 9/06 >> 9/10 ETT 9/11 >> 9/22 Trach 9/22 >>   A: Acute Hypoxic Respiratory Failure - Multifactorial from Pneumonia, Volume Overload & acute aspiration event (9/17) Pulmonary Edema  Pink/Frothy Sputum - resolved Bronchospasm Bilateral Pleural Effusions - S/P Thoracentesis/Transudate. Presumed COPD - H/O Tobacco Use. Hypercarbia - 9/19 ABG 6.9, pco2 85  P:   Progress toward comfort measures only.  Remove vent support once adequate pain control  achieved.   Trached 9/22 PRN albuterol only Wean PEEP / FiO2 for sats > 90% No further CXR's  CARDIOVASCULAR Rt PICC 9/06 >> A:  Bradycardic Arrest 9/17 - Likely due to aspiration. A-Fib with RVR- converted to NSR. Currently ST.  Shock - Likely ongoing due to sedation, On neo drip 9/23 with sedation contributing to hypotension. H/O hypertension  P:  Wean pressors to off  Pain control / comfort measures  RENAL A:   Acute Renal Failure - worsening with anuria 9/20. Hypervolemia - due to hypoalbuminemia Metabolic acidosis  Hypokalemia - treat Hypomagnesemia -treat. Hyponatremia -treat  P:   No further labs  Stop CVVHD   GASTROINTESTINAL A:   Abdominal Hemorrhage into Fluid Collections on R / Intraperitoneal Hemorrhage Chronic diarrhea with C diff colonization Hypoalbuminemia  N/V - Holding tube feedings Hx of GERD  P:   D/C tube feeding  NPO  Oral care PRN PRN zofran / phenergan   HEMATOLOGIC A:   Anemia - No signs of active bleeding on CT. S/P 2u PRBC 9/16. & 1u PRBC 9/17. Hx of CMML with chronic anemia.  P:  No further labs / transfusions  INFECTIOUS A:   Septic shock - Secondary to Pneumonia & questionable bacteremia. Questionable Streptococcal Pneumonia - Completed Fortaz 9/14 Streptococcal Viridans Bacteremia - Completed Fortaz 9/14  Cdiff colonization Enterobacter C resistant 9/23 changed to primaxin  P:    Linezolid 9/17 >> 9/19 Aztreonam 9/17 >> 9/23 Flagyl IV 9/17 >> Primaxin 9/23>> Linezolid 9/22>>  Blood Ctx 9/17 >> Urine Ctx 9/17 >> enterococcus >> sens vanco Resp Ctx 9/17 >> enterobacter cloacae >>resistant to aztreonam Resp cx 9/22>> Blood Ctx 9/13 >> negative  R Pleural Effusion 9/13 >> negative  BAL 9/7>>Bacteria Neg/AFB pending/Fungal pending  Blood Ctx 9/5 >> 1/2 positive for Strep Viridans Urine Strep Ag 9/5 >> negative Urine Leg Ag 9/5 >> negative  ENDOCRINE A:  Hyperglycemia P: No further  CBG's  NEUROLOGY A: Chest Pain post resuscitation H/O Chronic pain Depression Vent tolearance  P: Fentanyl gtt for pain  Robinul for secretions  PRN dilaudid  Versed PRN  TODAYS SUMMARY:  64 year old female with atrial fibrillation and pneumonia with repeat intubation due to hypoxic respiratory failure. Patient has had failure to wean likely owing to pulmonary edema from hypoalbuminemic state. Patient suffered bradycardic arrest on 9/17 after episode of emesis. Patient had no evidence of bowel obstruction or residual tube feeds prior to this event. With elevation of Procalcitonin and no obvious source for shock otherwise in the face of leukocytosis broad-spectrum antibiotics were restarted after repeating cultures. Hgb dropped overnight 9/19.  Repeat CT abd showed bleeding into prior fluid collections. CVVHD initiated on 9/20.  Clinical prognosis remains guarded given multiple medical problems. Trach performed 9/22. Palliative Care consulted with poor prognosis for recover.  Renal felt CVVHD approaching futile care.  Family made decision to progress toward comfort care with withdrawal of support 9/26.  Appreciate palliative care input.      Noe Gens, NP-C [facility] Pgr: [phone] or if no answer [phone] 01-17-2015, 10:55 AM

## 2015-01-17 NOTE — Progress Notes (Signed)
Critical Care Note Time in: 0930 Time out: 1100  Critical Care Diagnosis: VDRF pneumococcal PNA, septic shock, renal failure requiring CVVHD.  Discussed With: Dr Vaughan Browner, patient's sister Myriam Forehand, patient's son [name], patient's daughter in law, patient's daughter, bedside RNs  People Present: the undersigned, patient's daughter, RNs, RT  Medications Used: Fentanyl infusion titrated for comfort, Versed infusion titrated for comfort, Dilaudid 2 mg IV times 1 dose used.   Narrative Summary: 64 yo NP, admitted with PNA, VDRF from pneumococcal PNA, septic shock, renal failure, hospital course complicated by intubation, extubation, subsequent respiratory failure, trach procedure, internal abdominal hemorrhage, worsening hemodynamic status. Palliative consult on 01-11-15. Compassionate liberation from the ventilator, D/C antibiotics, pressors, CRRT around 10:40 AM on 2015-01-24. Patient tolerated the procedure well, she had no look of fear, did not appear to have any non verbal gestures of distress or discomfort. Patient with no acute restless movements. Patient was suctioned by RT prior to compassionate liberation from the ventilator.    Post-Extubation Respiratory Distress Observation Scale (RDOS) Score:  0  [place] Respiratory Congestion Scale: 0/3  Post-Extubation Disposition: to remain in ICU  Prognosis appears to be minutes to at the most 1-2 hours or so. Patient will likely have a hospital death.  ? ------------------------------------------------------------------------------------------------------------------- Respiratory Distress Observation Scale (RDOS) Variable 0 points 1 Point 2 Points Total Heart Rate BL + 5 BL + 6-10 BL + >10 (<90 bpm) (90 - 109) (>110)______0 Resp Rate BL + 3 BL + 4-6 BL + >6 (</= 18) (19-30) (>30) 0 Restlessness None 0 Paradoxical Abd None Present Muscle Mvnt  Accessory Muscle None 0 Use (clavicle rise)  Guttural Sound None 0 (end-exp grunt)  Nasal  Flaring None  Look of Fear Wide Eyed Facial Grimace Furrowed brow Clenched Teeth: none noted.   Discussed with bedside RNs about titrating Versed and Fentanyl infusions for comfort care.  90+ minutes spent.  Loistine Chance MD [phone] [facility] palliative medicine team

## 2015-01-17 NOTE — Progress Notes (Signed)
Daily Progress Note   Patient Name: [name]       Date: Feb 08, 2015 DOB: 06-13-1950  Age: 64 y.o. MRN#: [id number] Attending Physician: Rigoberto Noel, MD Primary Care Physician: Warren Danes, MD Admit Date: 01/09/2015  Reason for Consultation/Follow-up: Establishing goals of care  Subjective:  awake eyes open, squeezes hand. Is able to mouth her name. How ever, is not able to understand and have full conversations about her current critical condition. Currently, there is no family present at the bedside  Interval Events: Remains hypotensive on the neo drip Fentanyl drip being titrated for comfort, also with fentanyl boluses and Dilaudid IV PRN WBC still elevated, Hb down Will discuss with family again at 10 am regarding compassionate disconnection from the ventilator and comfort measures only. At that time, will also stop CRRT and D/C pressors. Have D/C am labs today.   Length of Stay: 21 days  Current Medications: Scheduled Meds:  . antiseptic oral rinse  7 mL Mouth Rinse QID  . arformoterol  15 mcg Nebulization BID  . budesonide (PULMICORT) nebulizer solution  0.5 mg Nebulization BID  . chlorhexidine gluconate  15 mL Mouth Rinse BID  . etomidate  40 mg Intravenous Once  . feeding supplement (NEPRO CARB STEADY)  1,000 mL Oral Q24H  . feeding supplement (PRO-STAT SUGAR FREE 64)  30 mL Per Tube BID  . fluconazole (DIFLUCAN) IV  400 mg Intravenous Q1400  . imipenem-cilastatin  500 mg Intravenous Q6H  . linezolid  600 mg Intravenous Q12H  . metronidazole  500 mg Intravenous 3 times per day  . pantoprazole (PROTONIX) IV  40 mg Intravenous Q24H  . promethazine  6.25 mg Intravenous Once  . sodium chloride  10-40 mL Intracatheter Q12H  . vecuronium  10 mg Intravenous Once    Continuous Infusions: . amiodarone 30 mg/hr (2015/02/08 0506)  . calcium gluconate infusion for CRRT 20 g (Feb 08, 2015 0758)  . dextrose Stopped (01/09/15 1631)  . fentaNYL infusion INTRAVENOUS 300 mcg/hr  (2015-02-08 0644)  . phenylephrine (NEO-SYNEPHRINE) Adult infusion 70 mcg/min (02/08/2015 0239)  . dialysis replacement fluid (prismasate) 400 mL/hr at 08-Feb-2015 0051  . dialysis replacement fluid (prismasate) 200 mL/hr at 01/11/15 0148  . dialysate (PRISMASATE) 1,800 mL/hr at Feb 08, 2015 0603  . sodium citrate 2 %/dextrose 2.5% solution 3000 mL 370 mL/hr at 02/08/15 0603    PRN Meds: albuterol, alteplase, fentaNYL (SUBLIMAZE) injection, heparin, HYDROmorphone (DILAUDID) injection, metoprolol, midazolam, [DISCONTINUED] ondansetron **OR** ondansetron (ZOFRAN) IV, sodium chloride, sodium chloride  Palliative Performance Scale: 10%     Vital Signs: BP 88/66 mmHg  Pulse 122  Temp(Src) 97.8 F (36.6 C) (Axillary)  Resp 20  Ht 5\' 5"  (1.651 m)  Wt 75.6 kg (166 lb 10.7 oz)  BMI 27.73 kg/m2  SpO2 84% SpO2: SpO2: (!) 84 % O2 Device: O2 Device: Ventilator O2 Flow Rate: O2 Flow Rate (L/min): 5 L/min  Intake/output summary:  Intake/Output Summary (Last 24 hours) at 2015/02/08 0827 Last data filed at February 08, 2015 0800  Gross per 24 hour  Intake 6096.13 ml  Output   6365 ml  Net -268.87 ml   LBM:   Baseline Weight: Weight: 60.1 kg (132 lb 7.9 oz) Most recent weight: Weight: 75.6 kg (166 lb 10.7 oz)  Physical Exam:  pale, mild anxiety   Mild to moderate distress, eyes open, look of fear Shallow clear anteriorly S1S2 Abdomen soft On bair hugger Trace edema          Additional Data Reviewed: Recent Labs  01/11/15  0400   18-Jan-2015  0400  01/18/2015  0550  18-Jan-2015  0600  WBC  29.7*   --   21.4*   --    --   HGB  7.0*   < >  7.1*  8.5*  8.2*  12.2  PLT  308   --   304   --    --   NA  135  135   < >  137  138  135  136  135  BUN  11  11   < >  11  11  9  6  9   CREATININE  0.66  0.70   < >  0.58  0.54  0.60  0.40*  0.50   < > = values in this interval not displayed.     Problem List:  Patient Active Problem List   Diagnosis Date Noted  . Hemodialysis patient   .  Encounter for palliative care   . Uncontrolled pain   . Acute respiratory acidosis   . Anemia   . Acute respiratory failure with hypoxemia   . Pleural effusion   . Pleural effusion on right   . Acute respiratory failure   . Acute respiratory failure with hypoxia   . ARDS (adult respiratory distress syndrome) 12/20/2014  . HCAP (healthcare-associated pneumonia) 12/18/2014  . Seasonal and perennial allergic rhinitis 08/10/2014  . GERD (gastroesophageal reflux disease) 06/12/2014  . C. difficile enteritis 12/17/2013  . Shock 12/14/2013  . Hypovolemia dehydration 12/14/2013  . Nausea 12/14/2013  . Protein-calorie malnutrition, severe 11/20/2013  . UTI (lower urinary tract infection) 11/19/2013  . Weakness 11/19/2013  . UTI (urinary tract infection) 11/19/2013  . Acute respiratory distress 11/19/2013  . Severe sepsis 11/19/2013  . ARF (acute renal failure) 11/19/2013  . Urinary retention 11/19/2013  . CAP (community acquired pneumonia) 11/19/2013  . Leukocytosis, unspecified 11/19/2013  . Anemia of chronic disease 10/18/2013  . Unspecified vitamin D deficiency 10/18/2013  . Otitis externa 09/26/2013  . COPD (chronic obstructive pulmonary disease) with chronic bronchitis 09/26/2013  . Tobacco abuse in remission 2014 09/26/2013  . Dyspnea 08/22/2013  . Hyperkalemia 08/22/2013  . Metabolic acidosis 24/40/1027  . 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 Pneumonia, multilobar vs. pulmonary edema 08/22/2013  . Symptomatic anemia 08/21/2013  . 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 Febrile illness 03/24/2013  . B12 deficiency 02/27/2013  . Anemia in neoplastic disease 01/28/2013  . Influenza with pneumonia 01/11/2013  . Obstructive chronic bronchitis with exacerbation 01/06/2013  . Cough 11/19/2012  . Hypocalcemia 07/17/2012  . Hypomagnesemia 06/27/2012  . Open abdominal wall wound 06/20/2012  . Ileostomy status 06/20/2012  . Elevated liver function tests-chronic  05/17/2012  . Ischemic colitis 01/31/2012  . 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 Osteoarthritis of hip 10/27/2011  . Postoperative anemia due to acute blood loss 10/27/2011  . Chronic hyponatremia 10/27/2011  . Ileus 10/27/2011  . Partial small bowel obstruction 07/05/2011  . Atrial fibrillation 07/05/2011  . Chronic ischemic colitis 07/05/2011  . Hypotension 07/05/2011    Class: Acute  . Atrial fibrillation with rapid ventricular response 05/09/2011  . Chronic back pain 05/08/2011  . Diarrhea 05/07/2011  . Dehydration 05/07/2011  . Benign hypertensive heart disease without heart failure 12/15/2010  . Pure hypercholesterolemia 12/15/2010     Palliative Care Assessment & Plan    Code Status:  DNR  Goals of Care:   likely compassionate liberation from the ventilator today, comfort measures only  Desire for further Chaplaincy support:no  3. Symptom Management:   fentanyl drip, IV Dilaudid PRN, IV Versed PRN  4. Palliative Prophylaxis:  Stool Softener: yes  5. Prognosis: Hours - Days  5. Discharge Planning: likely hospital death   Care plan was discussed with  Patient's RNs  Thank you for allowing the Palliative Medicine Team to assist in the care of this patient.   Time In: 0745 Time Out: 0825 Total Time 35 Prolonged Time Billed  no     Greater than 50%  of this time was spent counseling and coordinating care related to the above assessment and plan.  [address], MD  [phone] 27-Jan-2015, 8:27 AM  Please contact Palliative Medicine Team phone at [phone] for questions and concerns.

## 2015-01-17 NOTE — Progress Notes (Signed)
Patient passed away with family at bedside at 11:23 AM after withdrawal of care. Patient appeared comfortable and family tearful, but appropriate.

## 2015-01-17 NOTE — Progress Notes (Signed)
Nutrition Brief Note  Chart reviewed. TF d/c. Pt now transitioning to comfort care.  No further nutrition interventions warranted at this time.  Please re-consult as needed.   Clayton Bibles, MS, RD, LDN Pager: [phone] After Hours Pager: [phone]

## 2015-01-17 NOTE — Discharge Summary (Signed)
Name:[name] OMV:[id number] DOB:1951/03/26   ADMISSION DATE: 01/11/15 DATE OF DEATH: 2015-02-01  64 year old female with atrial fibrillation and pneumonia with repeat intubation due to hypoxic respiratory failure. Patient has had failure to wean likely owing to pulmonary edema from hypoalbuminemic state. Patient suffered bradycardic arrest on 9/17 after episode of emesis. Patient had no evidence of bowel obstruction or residual tube feeds prior to this event. With elevation of Procalcitonin and no obvious source for shock otherwise in the face of leukocytosis broad-spectrum antibiotics were restarted after repeating cultures. Hgb dropped overnight 9/19. Repeat CT abd showed bleeding into prior fluid collections. CVVHD initiated on 9/20. Clinical prognosis remains poor given multiple medical problems. Trach performed 9/22. Palliative Care consulted with poor prognosis for recover. Renal felt CVVHD approaching futile care. Family made decision to progress toward comfort care with withdrawal of support 02-01-2023.   [name] MD [facility] Pager [phone] If no answer or after 3pm call: [phone] 01/15/2015, 10:33 AM

## 2015-01-17 NOTE — Progress Notes (Signed)
   Jan 24, 2015 1100  Clinical Encounter Type  Visited With Family  Visit Type Follow-up;Psychological support;Spiritual support;Critical Care;Death  Referral From Chaplain  Consult/Referral To Chaplain  Spiritual Encounters  Spiritual Needs Emotional;Grief support  Stress Factors  Family Stress Factors Loss   Chaplain was ref. To the room of the patient as she was passing. There were multiple family members in the room at the time. The patient's daughter, son and daughter-in-law were at the bedside at the time of the patient's passing. The family were tearful, but were handling the situation as best as they could. One family member stated that they were in as good of a spiritual place as they could be.  Chaplain interventions included spiritual presence, pastoral conversation, emotional support and the gifting of a prayer shawl in remembrance of the patient to the family.  The Chaplain will follow-up with the family while they are here upon request.

## 2015-01-17 NOTE — Progress Notes (Signed)
Wasted 30 mL of 1mg /mL 63mL bag of versed and 60 mL of 200 mL 30mcg/mL bag of fentanyl. Wasted in sink. Witnessed by Odis Hollingshead, RN.

## 2015-01-17 NOTE — Telephone Encounter (Signed)
Okay, thanks

## 2015-01-17 DEATH — deceased

## 2015-01-19 LAB — FUNGUS CULTURE W SMEAR: FUNGAL SMEAR: NONE SEEN

## 2015-01-22 ENCOUNTER — Other Ambulatory Visit: Payer: 59

## 2015-02-05 ENCOUNTER — Other Ambulatory Visit: Payer: 59

## 2015-02-05 ENCOUNTER — Ambulatory Visit: Payer: 59

## 2015-02-05 LAB — AFB CULTURE WITH SMEAR (NOT AT ARMC): ACID FAST SMEAR: NONE SEEN

## 2015-02-19 ENCOUNTER — Other Ambulatory Visit: Payer: 59

## 2015-02-19 ENCOUNTER — Ambulatory Visit: Payer: 59

## 2015-02-23 ENCOUNTER — Other Ambulatory Visit: Payer: Self-pay | Admitting: Hematology and Oncology

## 2015-03-05 ENCOUNTER — Ambulatory Visit: Payer: 59

## 2015-03-05 ENCOUNTER — Other Ambulatory Visit: Payer: 59

## 2015-03-19 ENCOUNTER — Ambulatory Visit: Payer: 59

## 2015-03-19 ENCOUNTER — Other Ambulatory Visit: Payer: 59

## 2015-04-02 ENCOUNTER — Other Ambulatory Visit: Payer: 59

## 2015-04-02 ENCOUNTER — Ambulatory Visit: Payer: 59

## 2015-04-10 ENCOUNTER — Ambulatory Visit: Payer: 59 | Admitting: Internal Medicine

## 2015-04-16 ENCOUNTER — Other Ambulatory Visit: Payer: 59

## 2015-04-16 ENCOUNTER — Ambulatory Visit: Payer: 59

## 2015-04-30 ENCOUNTER — Other Ambulatory Visit: Payer: 59

## 2015-04-30 ENCOUNTER — Ambulatory Visit: Payer: 59

## 2015-05-14 ENCOUNTER — Ambulatory Visit: Payer: 59

## 2015-05-14 ENCOUNTER — Other Ambulatory Visit: Payer: 59

## 2015-05-28 ENCOUNTER — Other Ambulatory Visit: Payer: 59

## 2015-05-28 ENCOUNTER — Ambulatory Visit: Payer: 59

## 2015-06-11 ENCOUNTER — Other Ambulatory Visit: Payer: 59

## 2015-06-11 ENCOUNTER — Ambulatory Visit: Payer: 59

## 2015-06-11 ENCOUNTER — Ambulatory Visit: Payer: 59 | Admitting: Hematology and Oncology

## 2016-12-30 IMAGING — CT CT CHEST W/O CM
2 of 4 series · 15 of 36 positions shown, 18 images · non-contrast
Comparison: Chest radiograph 12/22/2014

CLINICAL DATA: Pneumonia versus pulmonary edema on chest
radiograph.

EXAM:
CT CHEST WITHOUT CONTRAST
TECHNIQUE: Multidetector CT imaging of the chest was performed following the
standard protocol without IV contrast.

[Series 2: chest w/o st · axial · non-contrast · 0.78mm/px · z∈[-404,-139]mm · 12 of 63 slices shown, 15 images]
[im 5/63  mediastinal]
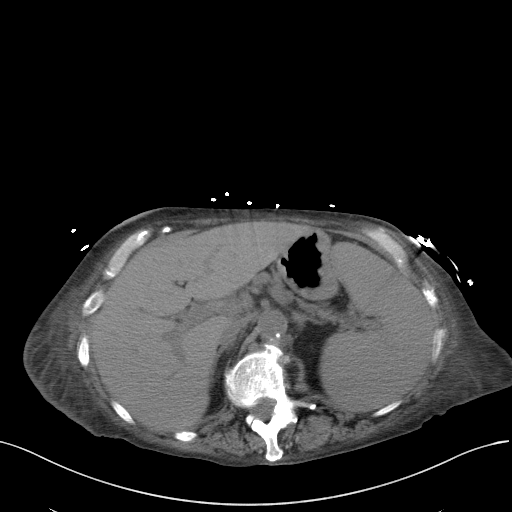
[im 5/63  lung]
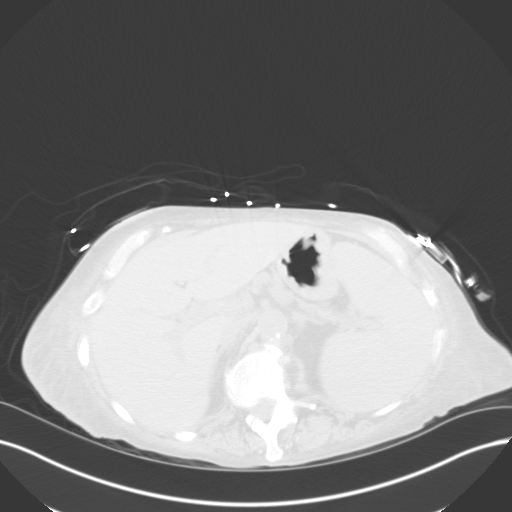
[im 9/63  lung]
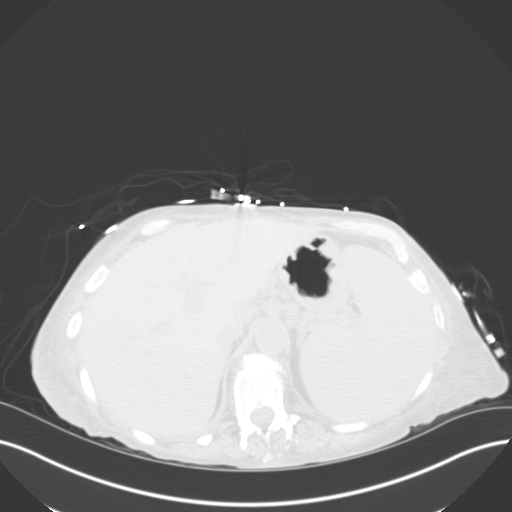
[im 14/63  lung]
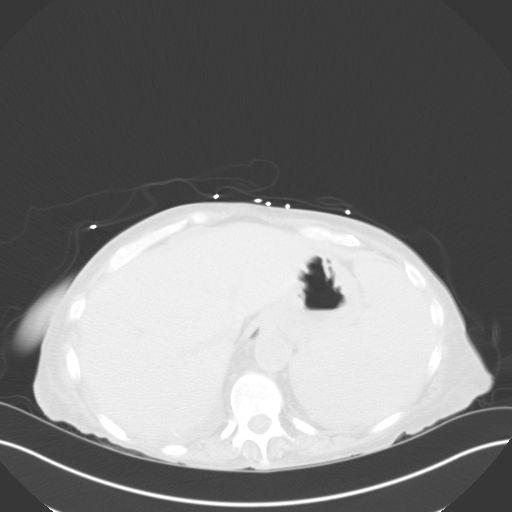
[im 18/63  lung]
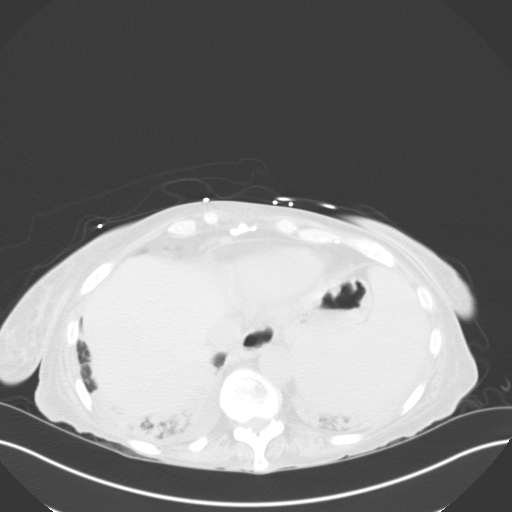
[im 23/63  mediastinal]
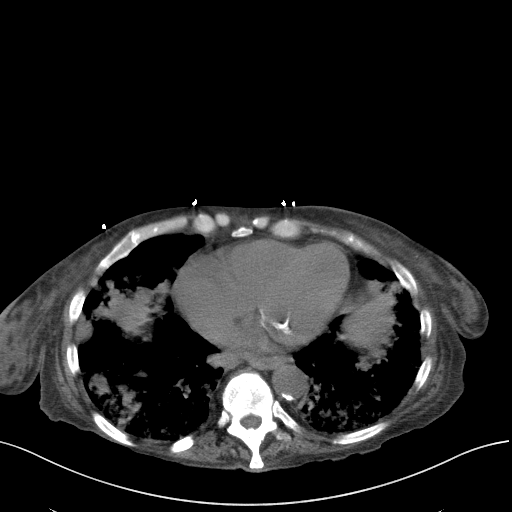
[im 23/63  lung]
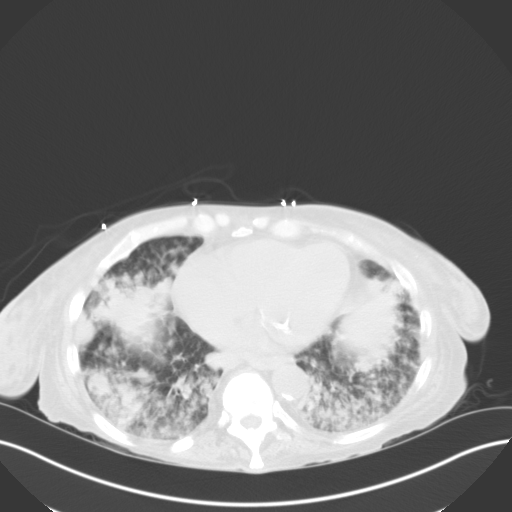
[im 27/63  lung]
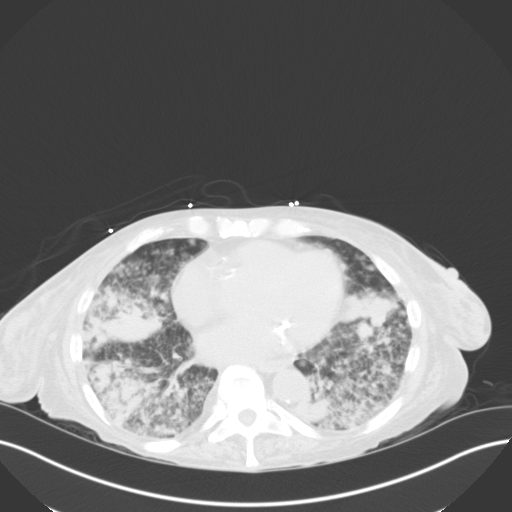
[im 36/63  lung]
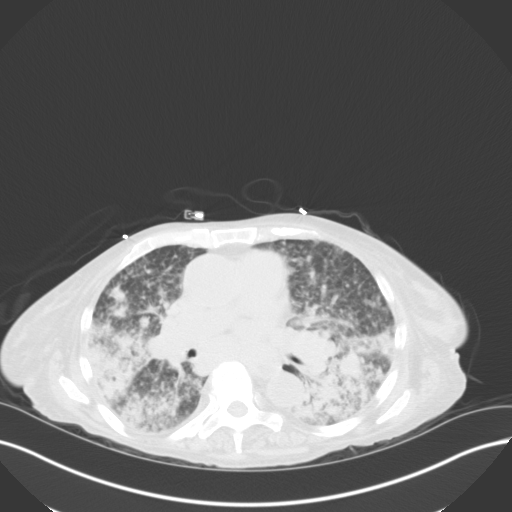
[im 40/63  lung]
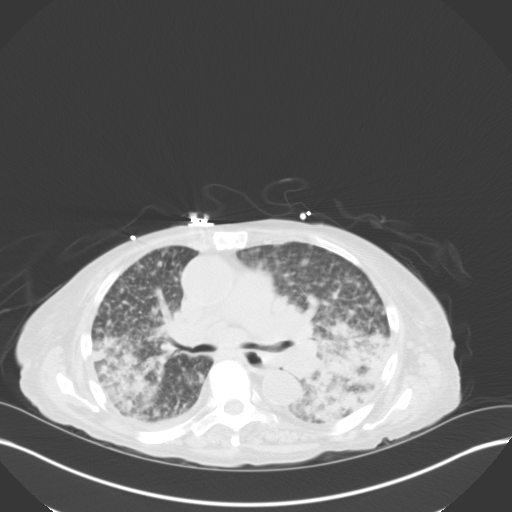
[im 45/63  mediastinal]
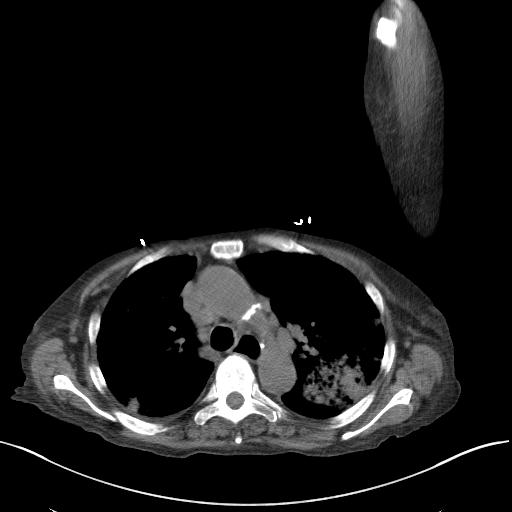
[im 45/63  lung]
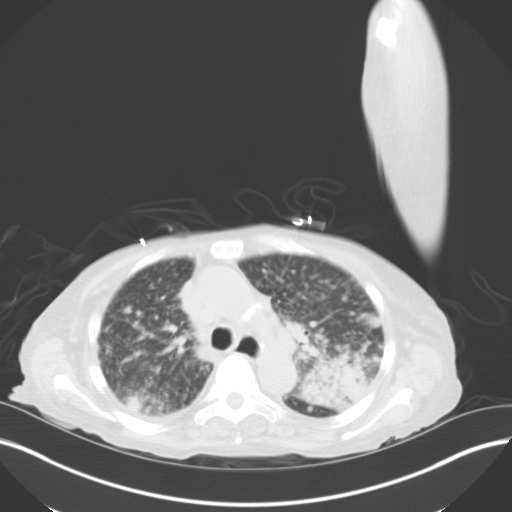
[im 49/63  lung]
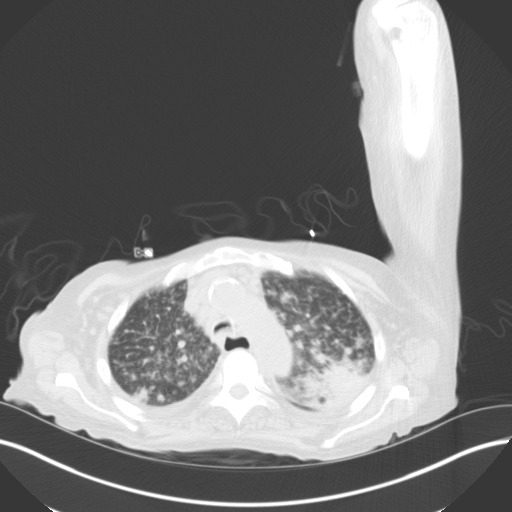
[im 54/63  lung]
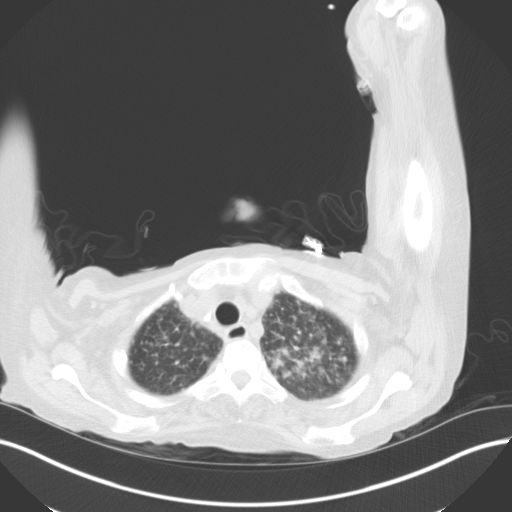
[im 58/63  lung]
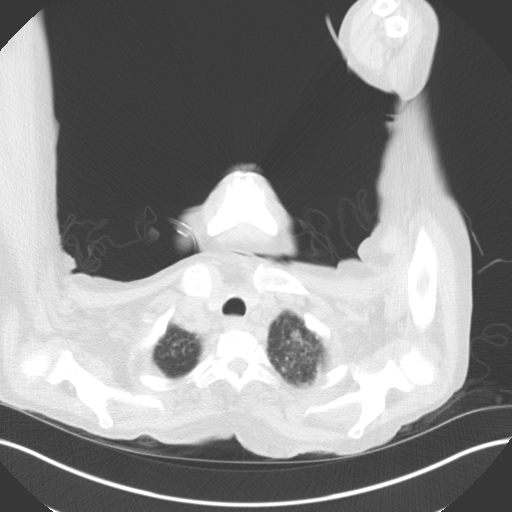

[Series 3: coronals · coronal · 0.71mm/px · 3 of 68 slices shown]
[im 14/68  lung]
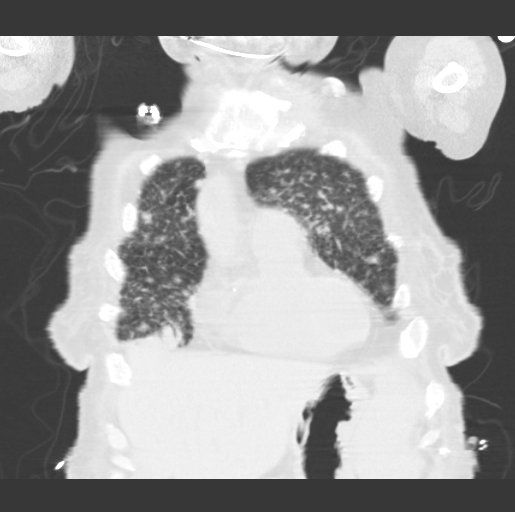
[im 27/68  lung]
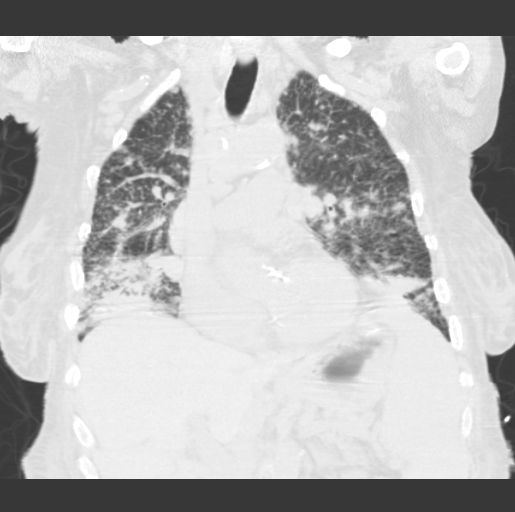
[im 41/68  lung]
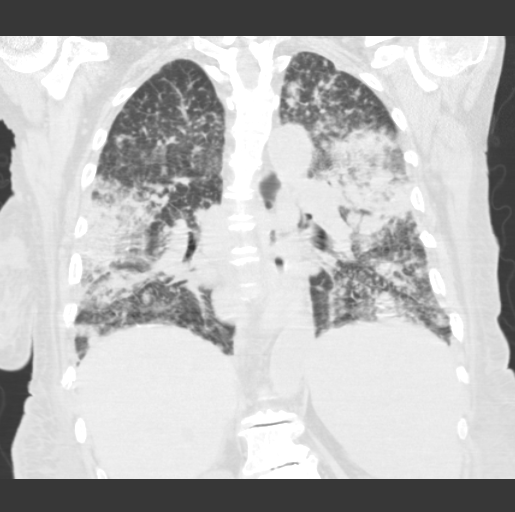

[15 of 36 positions shown; findings below may reference images not displayed]

FINDINGS: Mediastinum/Nodes: No axillary or supraclavicular adenopathy. There
are multiple mediastinal lymph nodes present. Largest in the
subcarinal location measuring 2.2 cm. RIGHT lower paratracheal lymph
node measures 1.6 cm. The bilateral hilar lymph nodes. No
pericardial fluid. Coronary calcifications noted. Esophagus is
normal.

Lungs/Pleura: There is dense lower lobe airspace disease which
reaches confluence. This also involvement of the RIGHT middle lobe
and lingula. Airspace disease extends to the upper lobes
additionally. Discrete nodule in the RIGHT upper lobe measures 10
mm. Nodule in lobe LEFT upper lobe measures 14 mm.

Upper abdomen: Spleen appears enlarged. Adrenal glands are normal.
No upper abdominal adenopathy identified.

Musculoskeletal: Degenerate change in the spine. No aggressive
osseous lesion.
IMPRESSION: 1. Dense diffuse bilateral airspace disease or reaching confluence/
consolidation and involving all the lobes of LEFT and RIGHT lung.
Several discrete nodules are present in the upper lobes.
2. Bulky mediastinal adenopathy.
3. This combination of dense nodular consolidation in the lungs and
bulky mediastinal adenopathy raises the concern for malignancy such
as lymphoma.

## 2016-12-31 IMAGING — US IR US GUIDE VASC ACCESS RIGHT
1 series · 1 of 1 positions shown · IV contrast (agent unspecified)
Comparison: none

INDICATION: Poor venous access, request for peripherally inserted central
catheter for intravenous medications.

EXAM:
ULTRASOUND AND FLUOROSCOPIC GUIDED PICC LINE INSERTION
MEDICATIONS:
None.
CONTRAST:  None
FLUOROSCOPY TIME:  1 minute 30 seconds.
COMPLICATIONS:
None immediate
TECHNIQUE: The procedure, risks, benefits, and alternatives were explained to
the patient and informed written consent was obtained. A timeout was
performed prior to the initiation of the procedure.

[Series 1: ir fluoro/shunt/fist · 1 of 1 slices shown]
[im 1/1]
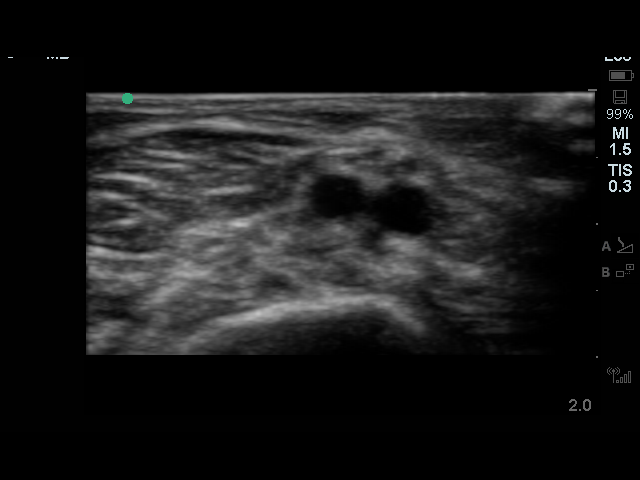

[1 of 1 positions shown; findings below may reference images not displayed]

The right upper extremity was prepped with chlorhexidine in a
sterile fashion, and a sterile drape was applied covering the
operative field. Maximum barrier sterile technique with sterile
gowns and gloves were used for the procedure. A timeout was
performed prior to the initiation of the procedure. Local anesthesia
was provided with 1% lidocaine.

Under direct ultrasound guidance, the right brachial vein was
accessed with a micropuncture kit after the overlying soft tissues
were anesthetized with 1% lidocaine. An ultrasound image was saved
for documentation purposes. A guidewire was advanced to the level of
the superior caval-atrial junction for measurement purposes and the
PICC line was cut to length. A peel-away sheath was placed and a 37
cm, 5 French, triple lumen was inserted to level of the superior
caval-atrial junction. A post procedure spot fluoroscopic was
obtained. The catheter easily aspirated and flushed and was sutured
in place. A dressing was placed. The patient tolerated the procedure
well without immediate post procedural complication.
FINDINGS: After catheter placement, the tip lies within the superior
cavoatrial junction. The catheter aspirates and flushes normally and
is ready for immediate use.
IMPRESSION: Successful ultrasound and fluoroscopic guided placement of a right
brachial vein approach, 37 cm, 5 French, triple lumen PICC with tip
at the superior caval-atrial junction. The PICC line is ready for
immediate use.
